# Patient Record
Sex: Male | Born: 1950
Health system: Southern US, Community
[De-identification: ages and names within clinical notes are randomized; demographics above are authoritative.]

## PROBLEM LIST (undated history)

## (undated) DIAGNOSIS — I4891 Unspecified atrial fibrillation: Secondary | ICD-10-CM

## (undated) DIAGNOSIS — I639 Cerebral infarction, unspecified: Secondary | ICD-10-CM

## (undated) DIAGNOSIS — F17200 Nicotine dependence, unspecified, uncomplicated: Secondary | ICD-10-CM

## (undated) DIAGNOSIS — N200 Calculus of kidney: Secondary | ICD-10-CM

## (undated) DIAGNOSIS — F1010 Alcohol abuse, uncomplicated: Secondary | ICD-10-CM

## (undated) DIAGNOSIS — R7401 Elevation of levels of liver transaminase levels: Secondary | ICD-10-CM

## (undated) DIAGNOSIS — K219 Gastro-esophageal reflux disease without esophagitis: Secondary | ICD-10-CM

## (undated) DIAGNOSIS — I10 Essential (primary) hypertension: Secondary | ICD-10-CM

## (undated) DIAGNOSIS — R740 Nonspecific elevation of levels of transaminase and lactic acid dehydrogenase [LDH]: Secondary | ICD-10-CM

## (undated) DIAGNOSIS — K279 Peptic ulcer, site unspecified, unspecified as acute or chronic, without hemorrhage or perforation: Secondary | ICD-10-CM

## (undated) DIAGNOSIS — R7301 Impaired fasting glucose: Secondary | ICD-10-CM

## (undated) DIAGNOSIS — E785 Hyperlipidemia, unspecified: Secondary | ICD-10-CM

## (undated) HISTORY — DX: Gastro-esophageal reflux disease without esophagitis: K21.9

## (undated) HISTORY — DX: Unspecified atrial fibrillation: I48.91

## (undated) HISTORY — DX: Peptic ulcer, site unspecified, unspecified as acute or chronic, without hemorrhage or perforation: K27.9

## (undated) HISTORY — DX: Nicotine dependence, unspecified, uncomplicated: F17.200

## (undated) HISTORY — DX: Calculus of kidney: N20.0

## (undated) HISTORY — DX: Hyperlipidemia, unspecified: E78.5

## (undated) HISTORY — DX: Impaired fasting glucose: R73.01

## (undated) HISTORY — DX: Alcohol abuse, uncomplicated: F10.10

## (undated) HISTORY — DX: Nonspecific elevation of levels of transaminase and lactic acid dehydrogenase (ldh): R74.0

## (undated) HISTORY — DX: Cerebral infarction, unspecified: I63.9

## (undated) HISTORY — DX: Essential (primary) hypertension: I10

## (undated) HISTORY — DX: Elevation of levels of liver transaminase levels: R74.01

---

## 2003-03-13 HISTORY — PX: COLONOSCOPY: SHX174

## 2004-03-12 HISTORY — PX: OTHER SURGICAL HISTORY: SHX169

## 2005-11-10 ENCOUNTER — Emergency Department (HOSPITAL_COMMUNITY): Admission: EM | Admit: 2005-11-10 | Discharge: 2005-11-10 | Payer: Self-pay | Admitting: Emergency Medicine

## 2005-11-10 IMAGING — CR DG ANKLE COMPLETE 3+V*R*
4 series · 4 of 4 positions shown · non-contrast
Comparison: none

CLINICAL DATA: Fell today with pain.
 RIGHT ANKLE:

[t ankle joint ap right]
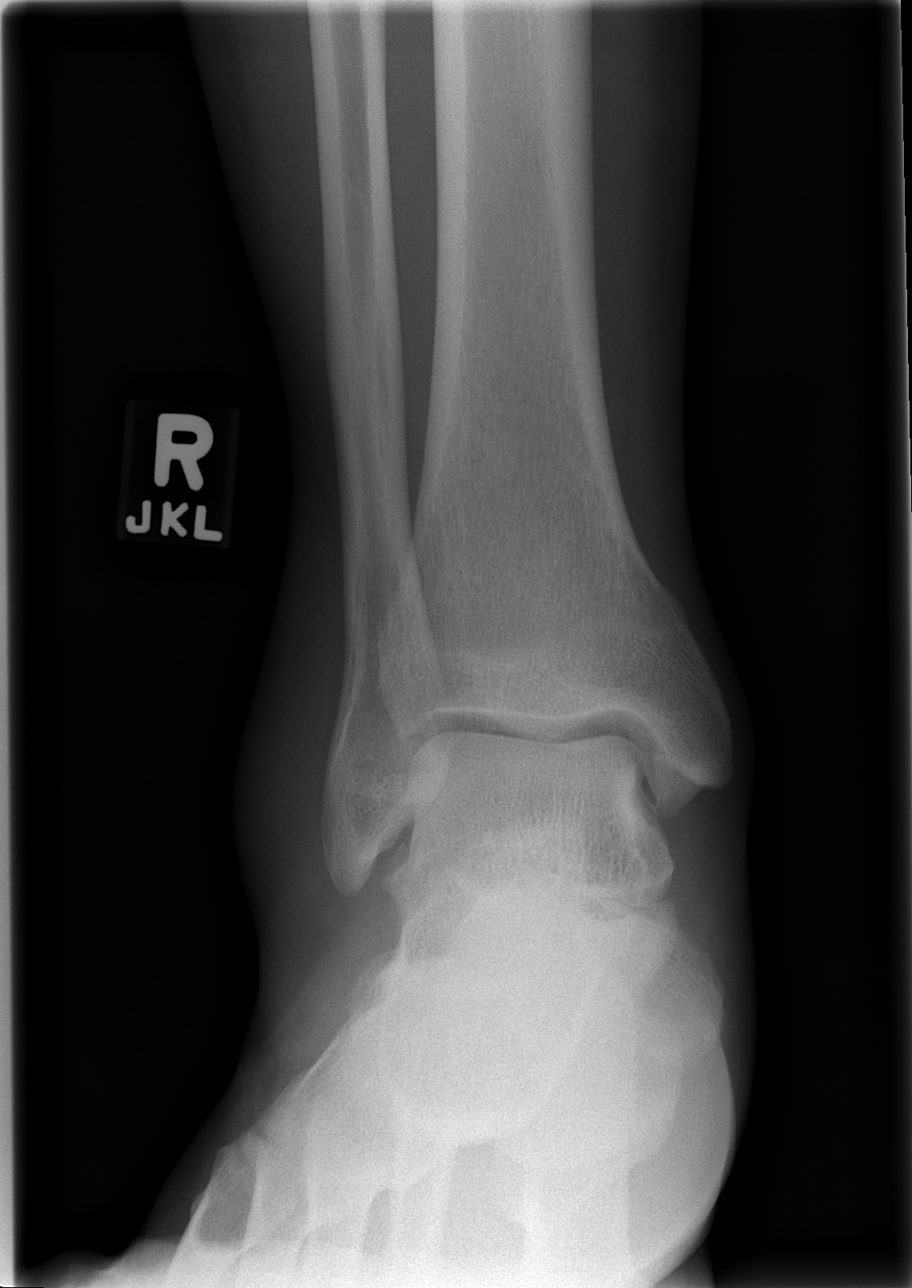

[t ankle joint oblique right]
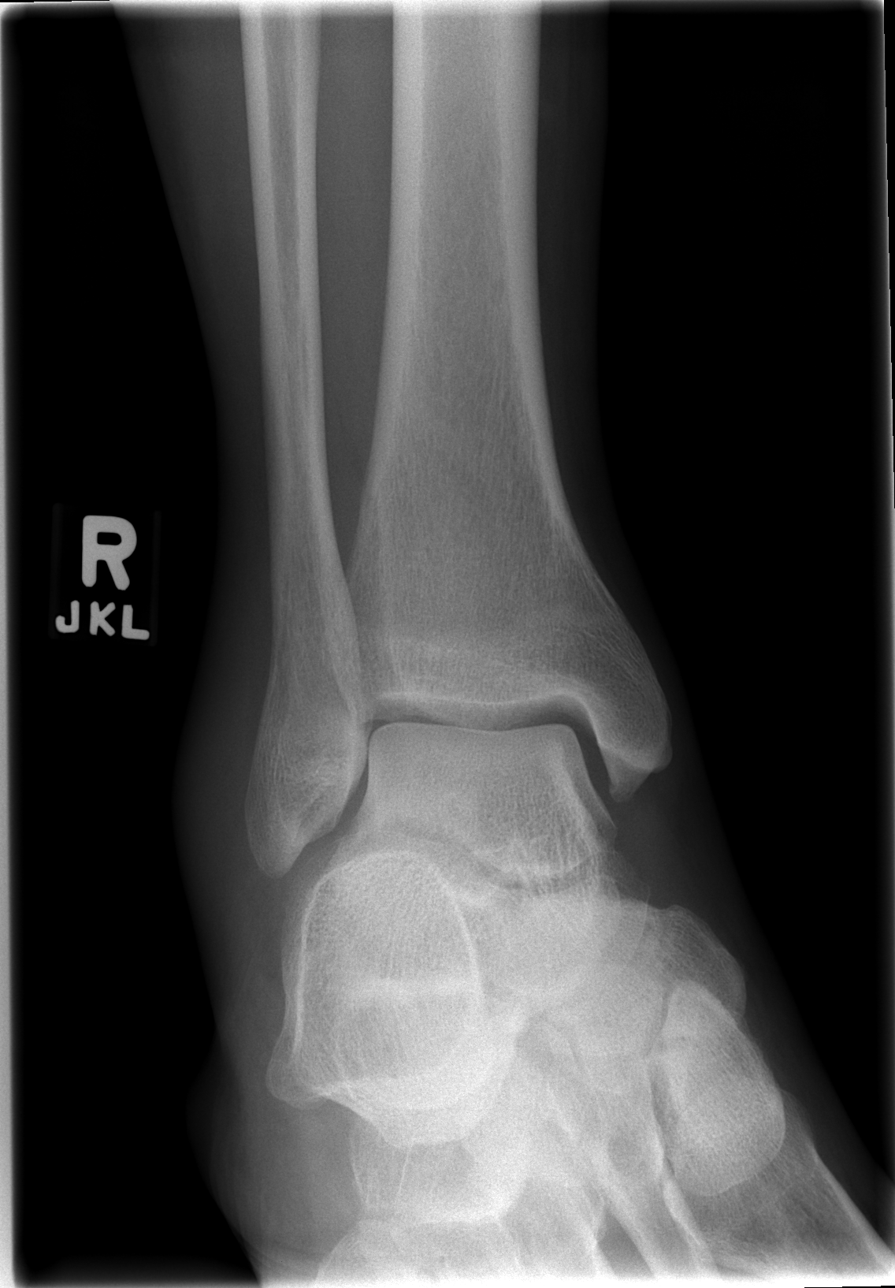

[t ankle joint lat right (1 of 2)]
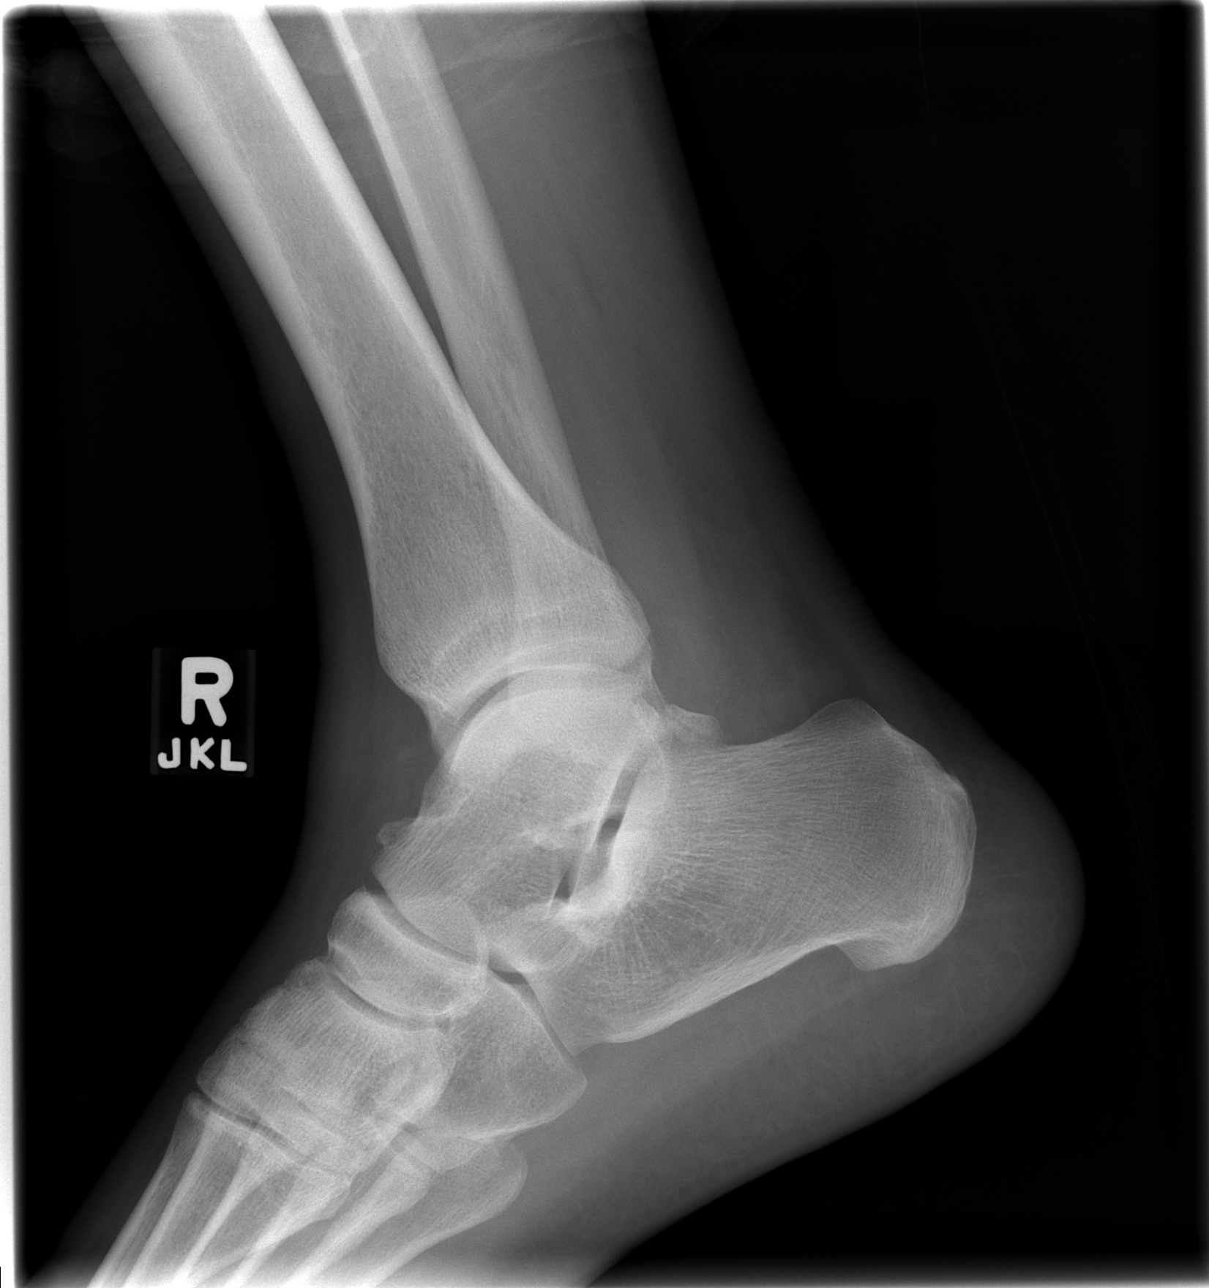

[t ankle joint lat right (2 of 2)]
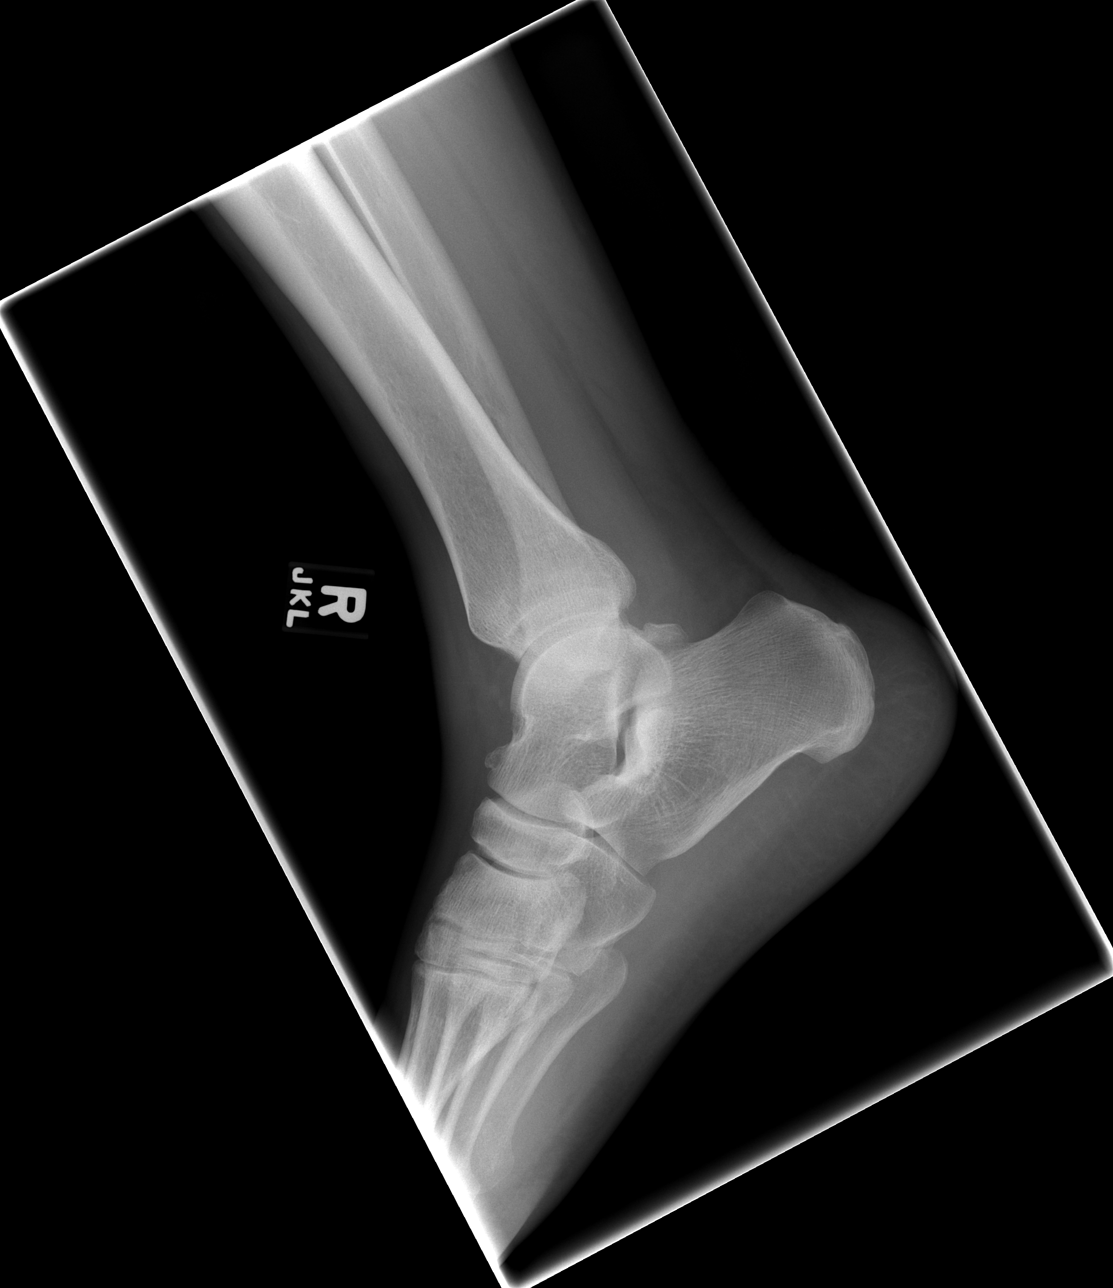

[4 of 4 positions shown; findings below may reference images not displayed]

FINDINGS: There is soft tissue swelling over the lateral malleolus.  The ankle joint appears normal.  No acute fracture is seen.
IMPRESSION: No acute fracture.

## 2013-09-18 DIAGNOSIS — L309 Dermatitis, unspecified: Secondary | ICD-10-CM | POA: Insufficient documentation

## 2013-09-18 DIAGNOSIS — Z720 Tobacco use: Secondary | ICD-10-CM | POA: Insufficient documentation

## 2013-09-18 DIAGNOSIS — I10 Essential (primary) hypertension: Secondary | ICD-10-CM | POA: Insufficient documentation

## 2013-09-18 DIAGNOSIS — K219 Gastro-esophageal reflux disease without esophagitis: Secondary | ICD-10-CM | POA: Insufficient documentation

## 2014-03-12 DIAGNOSIS — R7301 Impaired fasting glucose: Secondary | ICD-10-CM

## 2014-03-12 DIAGNOSIS — E785 Hyperlipidemia, unspecified: Secondary | ICD-10-CM

## 2014-03-12 HISTORY — DX: Hyperlipidemia, unspecified: E78.5

## 2014-03-12 HISTORY — DX: Impaired fasting glucose: R73.01

## 2014-03-15 ENCOUNTER — Ambulatory Visit: Payer: Self-pay | Admitting: Family Medicine

## 2014-03-18 ENCOUNTER — Ambulatory Visit (INDEPENDENT_AMBULATORY_CARE_PROVIDER_SITE_OTHER): Payer: BLUE CROSS/BLUE SHIELD | Admitting: Family Medicine

## 2014-03-18 ENCOUNTER — Encounter: Payer: Self-pay | Admitting: Family Medicine

## 2014-03-18 VITALS — BP 151/110 | HR 98 | Temp 98.0°F | Resp 18 | Ht 70.0 in | Wt 173.0 lb

## 2014-03-18 DIAGNOSIS — Z1211 Encounter for screening for malignant neoplasm of colon: Secondary | ICD-10-CM

## 2014-03-18 DIAGNOSIS — I10 Essential (primary) hypertension: Secondary | ICD-10-CM

## 2014-03-18 DIAGNOSIS — F17200 Nicotine dependence, unspecified, uncomplicated: Secondary | ICD-10-CM

## 2014-03-18 DIAGNOSIS — K219 Gastro-esophageal reflux disease without esophagitis: Secondary | ICD-10-CM

## 2014-03-18 LAB — COMPREHENSIVE METABOLIC PANEL
ALT: 29 U/L (ref 0–53)
AST: 45 U/L — AB (ref 0–37)
Albumin: 3.8 g/dL (ref 3.5–5.2)
Alkaline Phosphatase: 95 U/L (ref 39–117)
BUN: 8 mg/dL (ref 6–23)
CALCIUM: 9.9 mg/dL (ref 8.4–10.5)
CHLORIDE: 105 meq/L (ref 96–112)
CO2: 25 meq/L (ref 19–32)
CREATININE: 1.4 mg/dL (ref 0.4–1.5)
GFR: 68.23 mL/min (ref >60.00)
Glucose, Bld: 109 mg/dL — ABNORMAL HIGH (ref 70–99)
POTASSIUM: 4.5 meq/L (ref 3.5–5.1)
Sodium: 136 mEq/L (ref 135–145)
TOTAL PROTEIN: 7.7 g/dL (ref 6.0–8.3)
Total Bilirubin: 1.4 mg/dL — ABNORMAL HIGH (ref 0.2–1.2)

## 2014-03-18 LAB — CBC WITH DIFFERENTIAL/PLATELET
BASOS ABS: 0 10*3/uL (ref 0.0–0.1)
BASOS PCT: 0.5 % (ref 0.0–3.0)
Eosinophils Absolute: 0.2 10*3/uL (ref 0.0–0.7)
Eosinophils Relative: 4.6 % (ref 0.0–5.0)
HCT: 47.6 % (ref 39.0–52.0)
HEMOGLOBIN: 15.7 g/dL (ref 13.0–17.0)
Lymphocytes Relative: 32.6 % (ref 12.0–46.0)
Lymphs Abs: 1.7 10*3/uL (ref 0.7–4.0)
MCHC: 33.1 g/dL (ref 30.0–36.0)
MCV: 94.8 fl (ref 78.0–100.0)
MONOS PCT: 11.2 % (ref 3.0–12.0)
Monocytes Absolute: 0.6 10*3/uL (ref 0.1–1.0)
NEUTROS ABS: 2.7 10*3/uL (ref 1.4–7.7)
Neutrophils Relative %: 51.1 % (ref 43.0–77.0)
PLATELETS: 232 10*3/uL (ref 150.0–400.0)
RBC: 5.02 Mil/uL (ref 4.22–5.81)
RDW: 15.6 % — ABNORMAL HIGH (ref 11.5–15.5)
WBC: 5.2 10*3/uL (ref 4.0–10.5)

## 2014-03-18 LAB — TSH: TSH: 1.19 u[IU]/mL (ref 0.35–4.50)

## 2014-03-18 LAB — LIPID PANEL
CHOL/HDL RATIO: 3
Cholesterol: 260 mg/dL — ABNORMAL HIGH (ref 0–200)
HDL: 81.8 mg/dL (ref >39.00)
LDL CALC: 161 mg/dL — AB (ref 0–99)
NONHDL: 178.2
Triglycerides: 88 mg/dL (ref 0.0–149.0)
VLDL: 17.6 mg/dL (ref 0.0–40.0)

## 2014-03-18 MED ORDER — METOPROLOL SUCCINATE ER 25 MG PO TB24: 25.0000 mg | ORAL_TABLET | Freq: Every day | ORAL | Status: DC

## 2014-03-18 NOTE — Progress Notes (Signed)
Office Note 03/28/2014  CC:  Chief Complaint  Patient presents with  . Establish Care   HPI:  Russell Russell is a 64 y.o. Black male who is here to establish care. Patient's most recent primary MD:  Dr. Orville Govern ([facility] in [facility]) Old records were not reviewed prior to or during today's visit.  Says last CPE was about 6 yrs ago. Home bp avg <140 syst and <90 diast.  Upon further questioning it sounds like 140s over 90s is more like it.  No side effects from meds.  Denies regular use of NSAIDs.  Says GERD well controlled on daily omeprazole 20mg  dose. He is due for repeat colonoscopy (see [place]).   Past Medical History  Diagnosis Date  . Essential hypertension   . GERD (gastroesophageal reflux disease)   . Nephrolithiasis   . PUD (peptic ulcer disease)   . Tobacco dependence     chantix: "psych effects"  . Hyperlipidemia 03/2014    Atorv started.  . Impaired fasting glucose 03/2014    Past Surgical History  Procedure Laterality Date  . Removal of kidney stones  2006    cystoscopic--removed from ureter.  No prob since.  . Colonoscopy  2005    Recall 10 yrs ([place])    Family History  Problem Relation Age of Onset  . Cancer Mother     ?colon  . Cancer Father   . Cancer Sister     breast  . Cancer Sister     breast    History   Social History  . Marital Status: Married    Spouse Name: N/A    Number of Children: N/A  . Years of Education: N/A   Occupational History  . Not on file.   Social History Main Topics  . Smoking status: Current Every Day Smoker -- 0.50 packs/day for 20 years    Types: Cigarettes  . Smokeless tobacco: Never Used  . Alcohol Use: 4.8 oz/week    8 Cans of beer per week     Comment: Weekends only  . Drug Use: No  . Sexual Activity: Not on file   Other Topics Concern  . Not on file   Social History Narrative   Married, no children.   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   Tob: 10 pack-yr hx (current as of  03/2014).   Alcohol: 8 beers and pint of whisky on weekends only.   Denies hx of prob with drugs or alcohol.    Outpatient Encounter Prescriptions as of 03/18/2014  Medication Sig  . lisinopril (PRINIVIL,ZESTRIL) 20 MG tablet   . metoprolol succinate (TOPROL-XL) 25 MG 24 hr tablet Take 1 tablet (25 mg total) by mouth daily.  Marland Kitchen omeprazole (PRILOSEC) 20 MG capsule Take 20 mg by mouth.  . [DISCONTINUED] buPROPion (WELLBUTRIN XL) 150 MG 24 hr tablet Take 150 mg by mouth.  . [DISCONTINUED] halobetasol (ULTRAVATE) 0.05 % cream Apply topically.  . [DISCONTINUED] omeprazole (PRILOSEC) 20 MG capsule     Allergies  Allergen Reactions  . Penicillins Rash   ROS Review of Systems  Constitutional: Negative for fever and fatigue.  HENT: Negative for congestion and sore throat.   Eyes: Negative for visual disturbance.  Respiratory: Negative for cough.   Cardiovascular: Negative for chest pain.  Gastrointestinal: Negative for nausea and abdominal pain.  Genitourinary: Negative for dysuria.  Musculoskeletal: Negative for back pain and joint swelling.  Skin: Negative for rash.  Neurological: Negative for weakness and headaches.  Hematological: Negative  for adenopathy.    PE; Blood pressure 151/110, pulse 98, temperature 98 F (36.7 C), temperature source Temporal, resp. rate 18, height 5\' 10"  (1.778 m), weight 173 lb (78.472 kg), SpO2 95 %. 152/108 was repeat bp. Gen: Alert, well appearing.  Patient is oriented to person, place, time, and situation. CV: RRR, no m/r/g.   LUNGS: CTA bilat, nonlabored resps, good aeration in all lung fields. EXT: no clubbing, cyanosis, or edema.   Pertinent labs:  none  ASSESSMENT AND PLAN:   New pt; obtain old PCP records from the last 1 yr.  1) HTN: not well controlled.  Add toprol XL 25mg  qd. BMET ordered.  2) GERD; The current medical regimen is effective;  continue present plan and medications.  3) Tobacco dependence: encouraged cessation but he is  not contemplating quitting at this time.  4) Preventative health care:  Colon cancer screening: pt to contact [facility] to arrange repeat colonoscopy as he is due for his 10 yr repeat.   Pt declined flu vaccine today.  Fasting HP labs ordered today.  An After Visit Summary was printed and given to the patient.  Return in about 3 months (around 06/17/2014) for annual CPE (fasting).

## 2014-03-18 NOTE — Progress Notes (Signed)
Pre visit review using our clinic review tool, if applicable. No additional management support is needed unless otherwise documented below in the visit note. 

## 2014-03-19 ENCOUNTER — Telehealth: Payer: Self-pay | Admitting: Family Medicine

## 2014-03-19 NOTE — Telephone Encounter (Signed)
emmi mailed  °

## 2014-03-23 ENCOUNTER — Encounter: Payer: Self-pay | Admitting: Family Medicine

## 2014-03-23 ENCOUNTER — Other Ambulatory Visit: Payer: Self-pay | Admitting: Family Medicine

## 2014-03-23 MED ORDER — ATORVASTATIN CALCIUM 40 MG PO TABS: 40.0000 mg | ORAL_TABLET | Freq: Every day | ORAL | Status: DC

## 2014-06-23 ENCOUNTER — Encounter: Payer: Self-pay | Admitting: Family Medicine

## 2014-06-23 ENCOUNTER — Ambulatory Visit (INDEPENDENT_AMBULATORY_CARE_PROVIDER_SITE_OTHER): Payer: BLUE CROSS/BLUE SHIELD | Admitting: Family Medicine

## 2014-06-23 VITALS — BP 134/110 | HR 95 | Temp 98.5°F | Ht 70.0 in | Wt 168.0 lb

## 2014-06-23 DIAGNOSIS — Z125 Encounter for screening for malignant neoplasm of prostate: Secondary | ICD-10-CM

## 2014-06-23 DIAGNOSIS — I10 Essential (primary) hypertension: Secondary | ICD-10-CM | POA: Diagnosis not present

## 2014-06-23 DIAGNOSIS — R740 Nonspecific elevation of levels of transaminase and lactic acid dehydrogenase [LDH]: Secondary | ICD-10-CM | POA: Diagnosis not present

## 2014-06-23 DIAGNOSIS — Z1211 Encounter for screening for malignant neoplasm of colon: Secondary | ICD-10-CM

## 2014-06-23 DIAGNOSIS — F1010 Alcohol abuse, uncomplicated: Secondary | ICD-10-CM | POA: Diagnosis not present

## 2014-06-23 DIAGNOSIS — E785 Hyperlipidemia, unspecified: Secondary | ICD-10-CM

## 2014-06-23 DIAGNOSIS — Z0000 Encounter for general adult medical examination without abnormal findings: Secondary | ICD-10-CM

## 2014-06-23 DIAGNOSIS — R7401 Elevation of levels of liver transaminase levels: Secondary | ICD-10-CM

## 2014-06-23 LAB — COMPREHENSIVE METABOLIC PANEL
ALBUMIN: 4 g/dL (ref 3.5–5.2)
ALK PHOS: 110 U/L (ref 39–117)
ALT: 28 U/L (ref 0–53)
AST: 33 U/L (ref 0–37)
BILIRUBIN TOTAL: 1.5 mg/dL — AB (ref 0.2–1.2)
BUN: 8 mg/dL (ref 6–23)
CO2: 27 mEq/L (ref 19–32)
Calcium: 10 mg/dL (ref 8.4–10.5)
Chloride: 106 mEq/L (ref 96–112)
Creatinine, Ser: 1.13 mg/dL (ref 0.40–1.50)
GFR: 84.43 mL/min (ref >60.00)
Glucose, Bld: 108 mg/dL — ABNORMAL HIGH (ref 70–99)
Potassium: 4.9 mEq/L (ref 3.5–5.1)
SODIUM: 137 meq/L (ref 135–145)
Total Protein: 7.7 g/dL (ref 6.0–8.3)

## 2014-06-23 LAB — LIPID PANEL
CHOLESTEROL: 230 mg/dL — AB (ref 0–200)
HDL: 71.4 mg/dL (ref >39.00)
LDL Cholesterol: 141 mg/dL — ABNORMAL HIGH (ref 0–99)
NONHDL: 158.6
Total CHOL/HDL Ratio: 3
Triglycerides: 87 mg/dL (ref 0.0–149.0)
VLDL: 17.4 mg/dL (ref 0.0–40.0)

## 2014-06-23 MED ORDER — METOPROLOL SUCCINATE ER 50 MG PO TB24: ORAL_TABLET | ORAL | Status: DC

## 2014-06-23 NOTE — Progress Notes (Signed)
Pre visit review using our clinic review tool, if applicable. No additional management support is needed unless otherwise documented below in the visit note. 

## 2014-06-23 NOTE — Addendum Note (Signed)
Addended by: Julieta Bellini on: 06/23/2014 11:52 AM   Modules accepted: Medications, SmartSet

## 2014-06-23 NOTE — Progress Notes (Signed)
Office Note 06/23/2014  CC:  Chief Complaint  Patient presents with  . Annual Exam   HPI:  Russell Russell is a 64 y.o. Black male who is here for CPE. BP control at home unclear.  "Its on the high side": avg <160 syst and <100 diast. Started atorv 40mg  qd last visit (03/2014) for hyperlipidemia.  No side effects, has been compliant.   Fasting today. His wife gives me more history today: she is concerned about his alcohol abuse--he did not disclose this info to me last visit. She is appropriately worried about his liver (see PMH below).   Past Medical History  Diagnosis Date  . Essential hypertension   . GERD (gastroesophageal reflux disease)   . Nephrolithiasis   . PUD (peptic ulcer disease)   . Tobacco dependence     chantix: "psych effects"  . Hyperlipidemia 03/2014    Atorv started.  . Impaired fasting glucose 03/2014  . Alcohol abuse     drinks 1 gallon of Gin every weekend, nothing during the week x >15 yrs    Past Surgical History  Procedure Laterality Date  . Removal of kidney stones  2006    cystoscopic--removed from ureter.  No prob since.  . Colonoscopy  2005    Recall 10 yrs ([place])    Family History  Problem Relation Age of Onset  . Cancer Mother     ?colon  . Cancer Father   . Cancer Sister     breast  . Cancer Sister     breast    History   Social History  . Marital Status: Married    Spouse Name: N/A  . Number of Children: N/A  . Years of Education: N/A   Occupational History  . Not on file.   Social History Main Topics  . Smoking status: Current Every Day Smoker -- 0.50 packs/day for 20 years    Types: Cigarettes  . Smokeless tobacco: Never Used  . Alcohol Use: 4.8 oz/week    8 Cans of beer per week     Comment: Weekends only  . Drug Use: No  . Sexual Activity: Not on file   Other Topics Concern  . Not on file   Social History Narrative   Married, no children.   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   Tob:  10 pack-yr hx (current as of 03/2014).   Alcohol: 8 beers and pint of whisky on weekends only.   Denies hx of prob with drugs or alcohol.    Outpatient Prescriptions Prior to Visit  Medication Sig Dispense Refill  . atorvastatin (LIPITOR) 40 MG tablet Take 1 tablet (40 mg total) by mouth daily. 30 tablet 6  . lisinopril (PRINIVIL,ZESTRIL) 20 MG tablet   1  . omeprazole (PRILOSEC) 20 MG capsule Take 20 mg by mouth.    . metoprolol succinate (TOPROL-XL) 25 MG 24 hr tablet Take 1 tablet (25 mg total) by mouth daily. 30 tablet 3   No facility-administered medications prior to visit.    Allergies  Allergen Reactions  . Penicillins Rash   ROS Review of Systems  PE; Blood pressure 134/110, pulse 95, temperature 98.5 F (36.9 C), temperature source Oral, height 5\' 10"  (1.778 m), weight 168 lb (76.204 kg), SpO2 97 %. Gen: Alert, well appearing.  Patient is oriented to person, place, time, and situation. AFFECT: pleasant, lucid thought and speech. ENT: Ears: EACs clear, normal epithelium.  TMs with good light reflex and landmarks bilaterally.  Eyes: no injection, icteris, swelling, or exudate.  EOMI, PERRLA. Nose: no drainage or turbinate edema/swelling.  No injection or focal lesion.  Mouth: lips without lesion/swelling.  Oral mucosa pink and moist.  Dentition intact and without obvious caries or gingival swelling.  Oropharynx without erythema, exudate, or swelling.  Neck: supple/nontender.  No LAD, mass, or TM.  Carotid pulses 2+ bilaterally, without bruits. CV: RRR, no m/r/g.   LUNGS: CTA bilat, nonlabored resps, good aeration in all lung fields. ABD: soft, NT, ND, BS normal.  No hepatospenomegaly or mass.  No bruits. EXT: no clubbing, cyanosis, or edema.  Musculoskeletal: no joint swelling, erythema, warmth, or tenderness.  ROM of all joints intact. Skin - no sores or suspicious lesions or rashes or color changes Rectal: pt deferred this today  Pertinent labs:  Lab Results  Component  Value Date   TSH 1.19 03/18/2014   Lab Results  Component Value Date   WBC 5.2 03/18/2014   HGB 15.7 03/18/2014   HCT 47.6 03/18/2014   MCV 94.8 03/18/2014   PLT 232.0 03/18/2014   Lab Results  Component Value Date   CREATININE 1.4 03/18/2014   BUN 8 03/18/2014   NA 136 03/18/2014   K 4.5 03/18/2014   CL 105 03/18/2014   CO2 25 03/18/2014   Lab Results  Component Value Date   ALT 29 03/18/2014   AST 45* 03/18/2014   ALKPHOS 95 03/18/2014   BILITOT 1.4* 03/18/2014   Lab Results  Component Value Date   CHOL 260* 03/18/2014   Lab Results  Component Value Date   HDL 81.80 03/18/2014   Lab Results  Component Value Date   LDLCALC 161* 03/18/2014   Lab Results  Component Value Date   TRIG 88.0 03/18/2014   Lab Results  Component Value Date   CHOLHDL 3 03/18/2014   ASSESSMENT AND PLAN:   1) Alcohol abuse, with mild elev of AST and bili last lab check. Encouraged pt to cut back slowly until he has completely quit. Recheck CMET today, also arrange US abdomen.  2) Hyperlipidemia: tolerating statin.  CMET today. FLP repeat today.  3) HTN; not ideal control.  Increase Toprol XL generic to 50mg  qd today.  4) Reviewed age and gender appropriate health maintenance issues (prudent diet, regular exercise, health risks of tobacco and excessive alcohol, use of seatbelts, fire alarms in home, use of sunscreen).  Also reviewed age and gender appropriate health screening as well as vaccine recommendations. Pt deferred prostate ca screening today, says we may be able to do this at a subsequent f/u visit. Colon ca screening: pt unable to locate/recall exact clinic where he had his last colonoscopy, so he asked for referral to [facility] today for next colonoscopy, which he is now due for. He declined HIV screening today. Says last Td was 2008.  An After Visit Summary was printed and given to the patient.  FOLLOW UP:  Return in about 4 weeks (around 07/21/2014) for f/u  ETOH/HTN.

## 2014-06-25 ENCOUNTER — Ambulatory Visit (HOSPITAL_BASED_OUTPATIENT_CLINIC_OR_DEPARTMENT_OTHER): Payer: BLUE CROSS/BLUE SHIELD

## 2014-06-27 ENCOUNTER — Ambulatory Visit (HOSPITAL_BASED_OUTPATIENT_CLINIC_OR_DEPARTMENT_OTHER)
Admission: RE | Admit: 2014-06-27 | Discharge: 2014-06-27 | Disposition: A | Discharge status: Home / Self Care | Payer: BLUE CROSS/BLUE SHIELD | Source: Ambulatory Visit | Attending: Family Medicine | Admitting: Family Medicine

## 2014-06-27 DIAGNOSIS — R740 Nonspecific elevation of levels of transaminase and lactic acid dehydrogenase [LDH]: Secondary | ICD-10-CM

## 2014-06-27 DIAGNOSIS — F1010 Alcohol abuse, uncomplicated: Secondary | ICD-10-CM

## 2014-06-27 DIAGNOSIS — R7989 Other specified abnormal findings of blood chemistry: Secondary | ICD-10-CM | POA: Diagnosis not present

## 2014-06-27 DIAGNOSIS — D1809 Hemangioma of other sites: Secondary | ICD-10-CM | POA: Insufficient documentation

## 2014-06-27 DIAGNOSIS — R7401 Elevation of levels of liver transaminase levels: Secondary | ICD-10-CM

## 2014-06-27 IMAGING — US US ABDOMEN COMPLETE
1 series · 14 of 25 positions shown · non-contrast
Comparison: Abdomen and pelvis CT dated [DATE].

CLINICAL DATA: Elevated liver function tests and bilirubin. Alcohol
abuse.

EXAM:
ULTRASOUND ABDOMEN COMPLETE

[Series 1: us abdomen complete · 0.18mm/px · 14 of 109 slices shown]
[im 1/109]
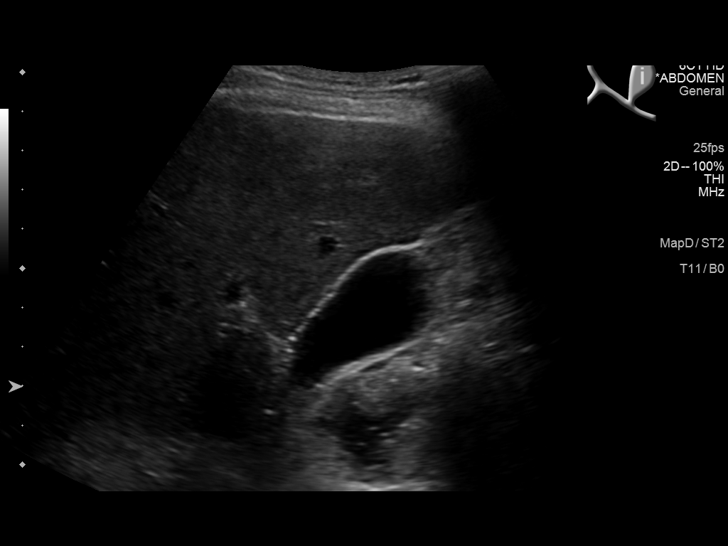
[im 10/109]
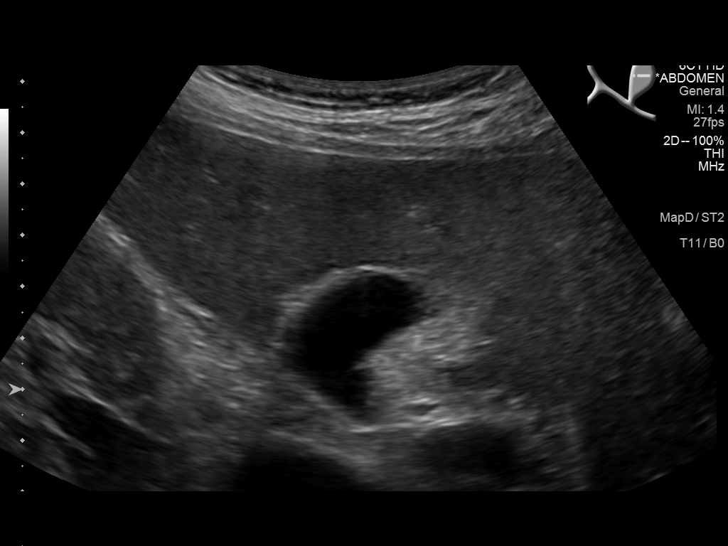
[im 19/109]
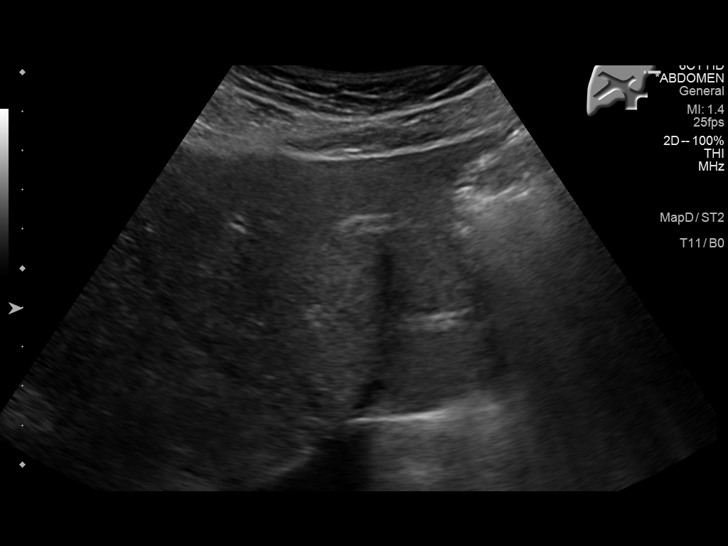
[im 28/109]
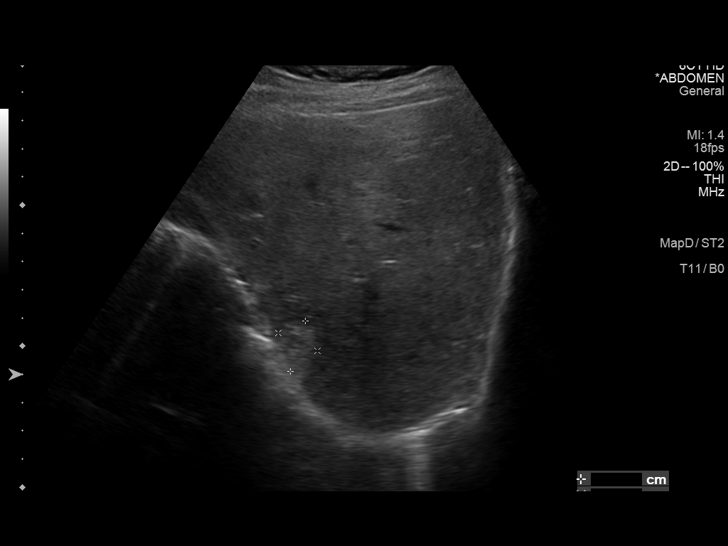
[im 37/109]
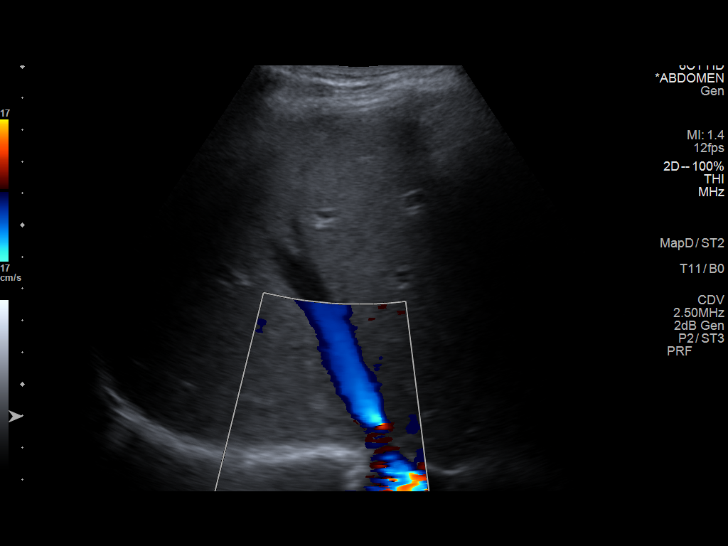
[im 41/109]
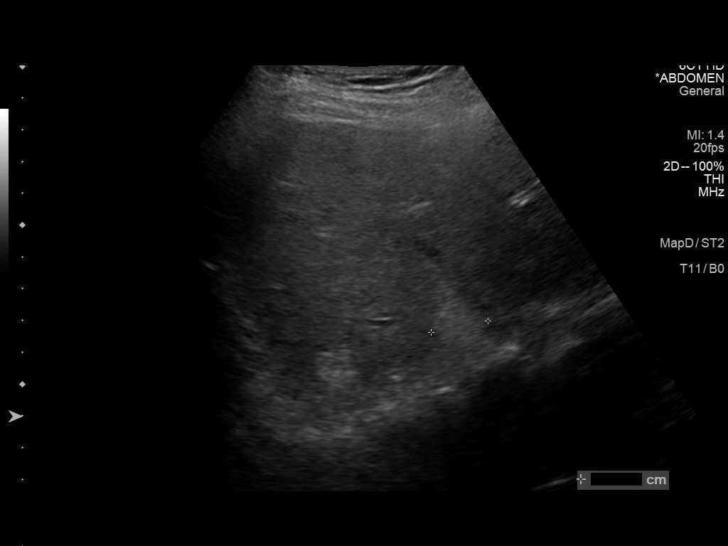
[im 50/109]
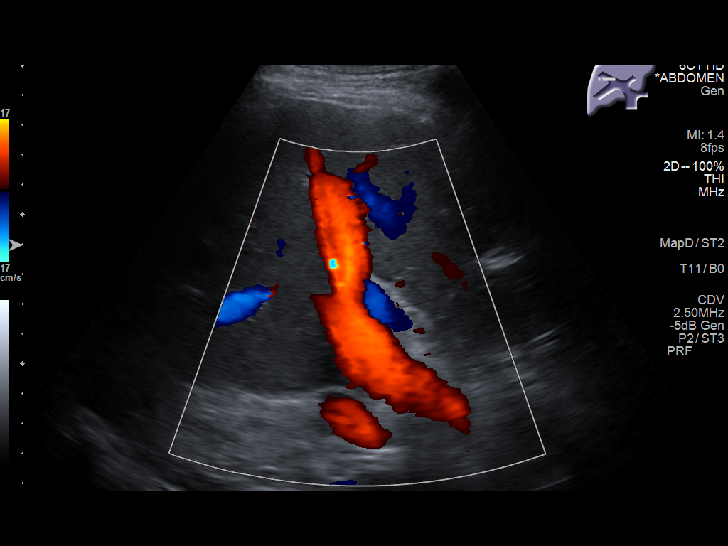
[im 59/109]
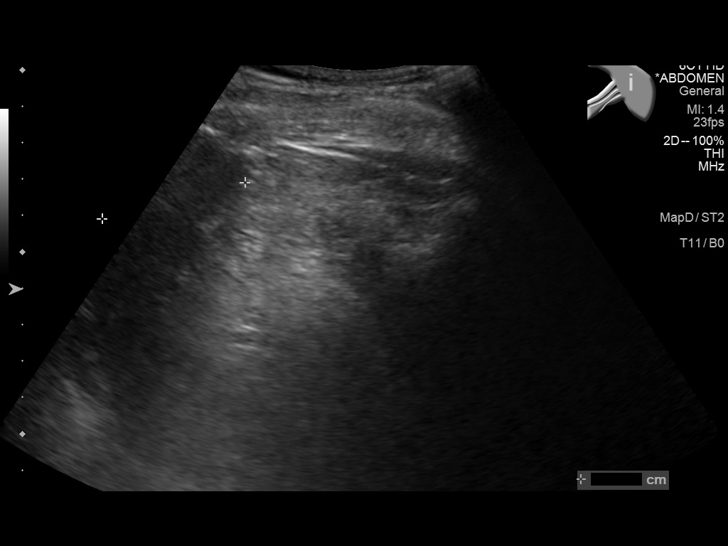
[im 68/109]
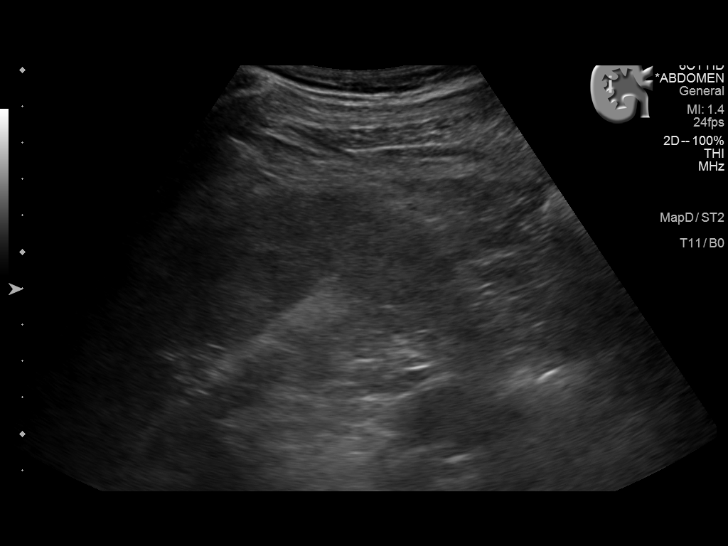
[im 73/109]
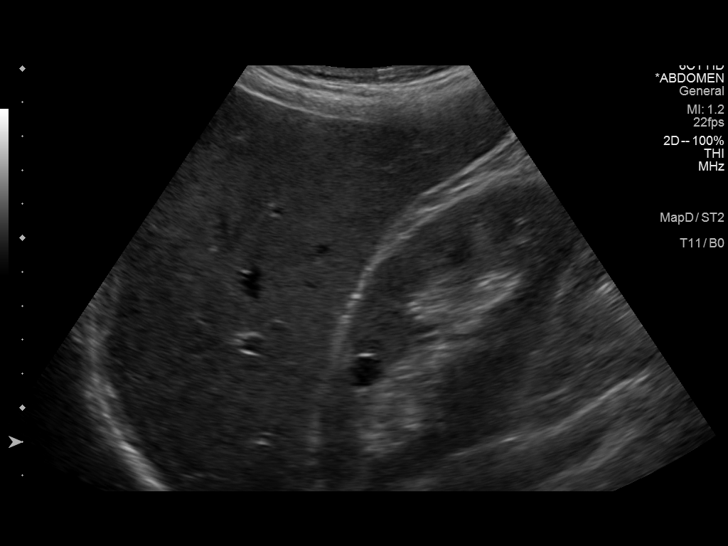
[im 82/109]
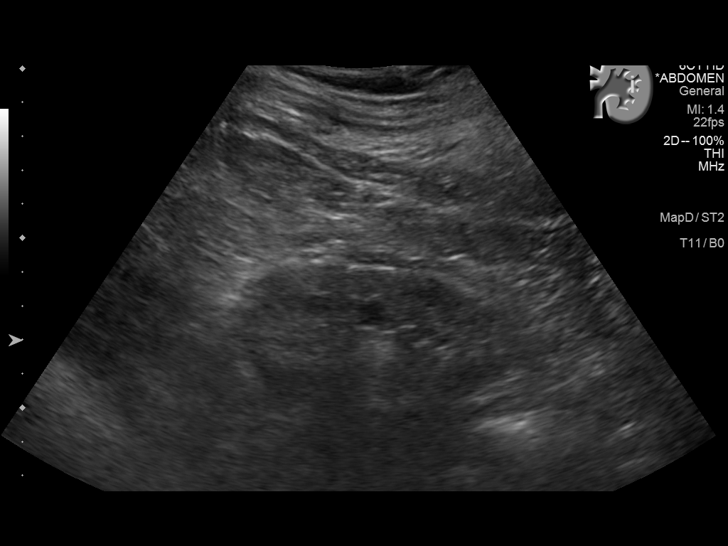
[im 91/109]
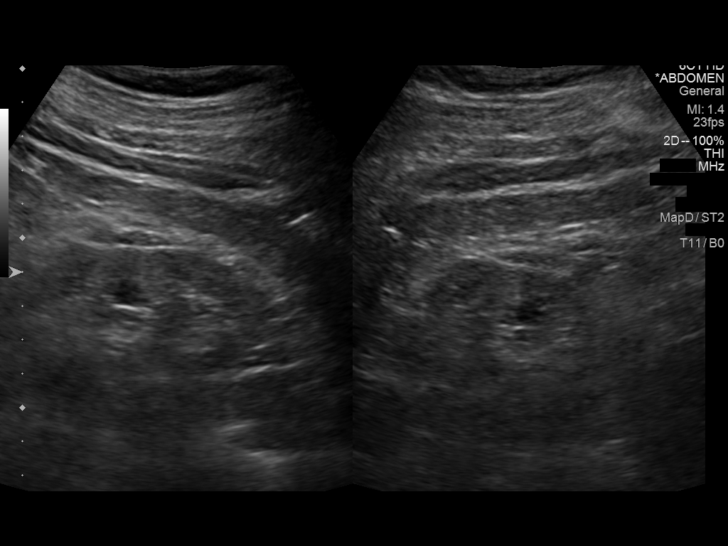
[im 100/109]
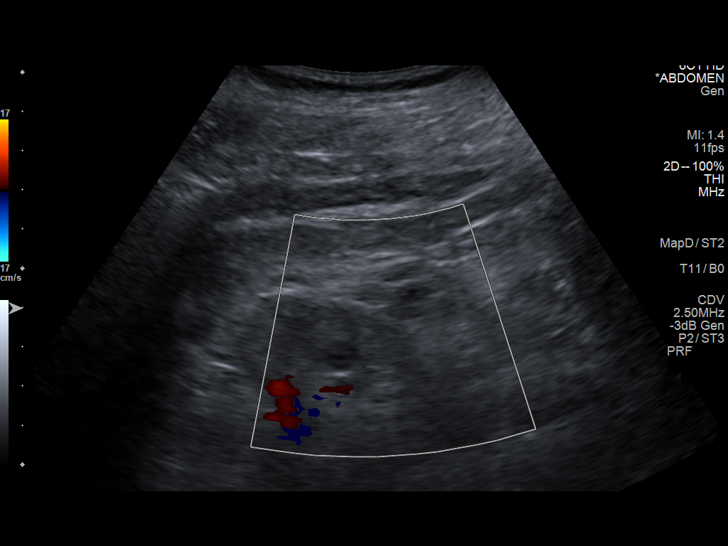
[im 109/109]
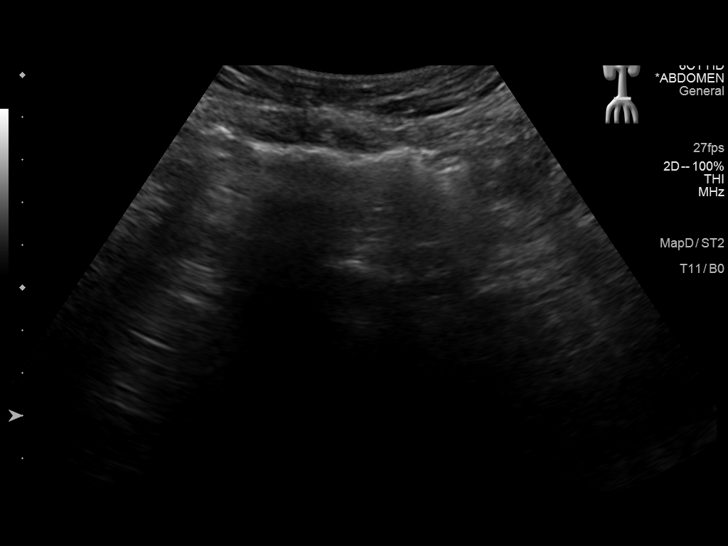

[14 of 25 positions shown; findings below may reference images not displayed]

FINDINGS: Gallbladder: No gallstones or wall thickening visualized. No
sonographic Murphy sign noted.

Common bile duct: Diameter: 2.0 mm

Liver: Multiple small, oval, echogenic masses in the liver measuring
up to 1.9 cm in maximum diameter each. These have not changed
significantly in size compared to the previous CT.

IVC: No abnormality visualized.

Pancreas: Visualized portion unremarkable.

Spleen: Poorly visualized.  Grossly normal.

Right Kidney: Length: 11.6 cm. Normal echogenicity. 2 small cysts.
No hydronephrosis.

Left Kidney: Length: 9.6 cm. Small cysts. No hydronephrosis. Normal
echogenicity.

Abdominal aorta: No aneurysm visualized.

Other findings: None.
IMPRESSION: 1. Stable small liver hemangiomas.
2. No acute abnormality.

## 2014-06-28 ENCOUNTER — Telehealth: Payer: Self-pay | Admitting: Family Medicine

## 2014-06-28 NOTE — Telephone Encounter (Signed)
Pt's wife aware of results per lab result note.

## 2014-06-28 NOTE — Telephone Encounter (Signed)
Patient is inquiring about US results.

## 2014-07-21 ENCOUNTER — Ambulatory Visit: Payer: BLUE CROSS/BLUE SHIELD | Admitting: Family Medicine

## 2014-07-22 ENCOUNTER — Other Ambulatory Visit: Payer: Self-pay

## 2014-07-22 MED ORDER — METOPROLOL SUCCINATE ER 50 MG PO TB24: ORAL_TABLET | ORAL | Status: DC

## 2014-07-22 NOTE — Telephone Encounter (Signed)
Patient requesting 90 day supply. Last OV 06/23/14. 90 Day supply sent in with no refills.

## 2014-08-06 ENCOUNTER — Encounter: Payer: Self-pay | Admitting: Family Medicine

## 2014-08-06 ENCOUNTER — Ambulatory Visit: Payer: BLUE CROSS/BLUE SHIELD | Admitting: Family Medicine

## 2014-09-13 ENCOUNTER — Emergency Department (HOSPITAL_BASED_OUTPATIENT_CLINIC_OR_DEPARTMENT_OTHER): Payer: BLUE CROSS/BLUE SHIELD

## 2014-09-13 ENCOUNTER — Emergency Department (HOSPITAL_BASED_OUTPATIENT_CLINIC_OR_DEPARTMENT_OTHER)
Admission: EM | Admit: 2014-09-13 | Discharge: 2014-09-13 | Disposition: A | Discharge status: Home / Self Care | Payer: BLUE CROSS/BLUE SHIELD | Attending: Emergency Medicine | Admitting: Emergency Medicine

## 2014-09-13 ENCOUNTER — Encounter (HOSPITAL_BASED_OUTPATIENT_CLINIC_OR_DEPARTMENT_OTHER): Payer: Self-pay | Admitting: *Deleted

## 2014-09-13 DIAGNOSIS — R109 Unspecified abdominal pain: Secondary | ICD-10-CM | POA: Diagnosis not present

## 2014-09-13 DIAGNOSIS — Z9889 Other specified postprocedural states: Secondary | ICD-10-CM | POA: Insufficient documentation

## 2014-09-13 DIAGNOSIS — Z79899 Other long term (current) drug therapy: Secondary | ICD-10-CM | POA: Insufficient documentation

## 2014-09-13 DIAGNOSIS — Z87442 Personal history of urinary calculi: Secondary | ICD-10-CM | POA: Insufficient documentation

## 2014-09-13 DIAGNOSIS — E785 Hyperlipidemia, unspecified: Secondary | ICD-10-CM | POA: Diagnosis not present

## 2014-09-13 DIAGNOSIS — Z8711 Personal history of peptic ulcer disease: Secondary | ICD-10-CM | POA: Insufficient documentation

## 2014-09-13 DIAGNOSIS — K219 Gastro-esophageal reflux disease without esophagitis: Secondary | ICD-10-CM | POA: Insufficient documentation

## 2014-09-13 DIAGNOSIS — Z880 Allergy status to penicillin: Secondary | ICD-10-CM | POA: Diagnosis not present

## 2014-09-13 DIAGNOSIS — I10 Essential (primary) hypertension: Secondary | ICD-10-CM | POA: Diagnosis not present

## 2014-09-13 LAB — URINALYSIS, ROUTINE W REFLEX MICROSCOPIC
BILIRUBIN URINE: NEGATIVE
Glucose, UA: 250 mg/dL — AB
Hgb urine dipstick: NEGATIVE
KETONES UR: NEGATIVE mg/dL
Leukocytes, UA: NEGATIVE
Nitrite: NEGATIVE
Protein, ur: NEGATIVE mg/dL
Specific Gravity, Urine: 1.006 (ref 1.005–1.030)
Urobilinogen, UA: 0.2 mg/dL (ref 0.0–1.0)
pH: 5.5 (ref 5.0–8.0)

## 2014-09-13 IMAGING — CT CT RENAL STONE PROTOCOL
2 of 4 series · 16 of 46 positions shown, 18 images · non-contrast
Comparison: [DATE]

CLINICAL DATA: RIGHT flank pain for 1 month, history essential
hypertension, nephrolithiasis, smoking, hyperlipidemia

EXAM:
CT ABDOMEN AND PELVIS WITHOUT CONTRAST
TECHNIQUE: Multidetector CT imaging of the abdomen and pelvis was performed
following the standard protocol without IV contrast. Sagittal and
coronal MPR images reconstructed from axial data set.

[Series 2: renal stone > 200 lbs 5.0 b31f · axial · 0.79mm/px · z∈[-555,-75]mm · 13 of 109 slices shown, 15 images]
[im 9/109  soft-tissue]
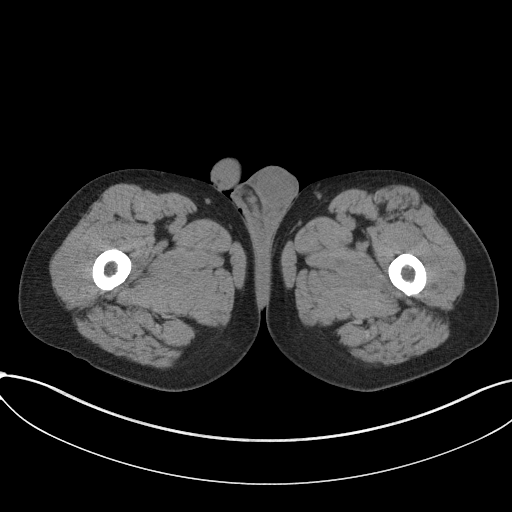
[im 9/109  bone]
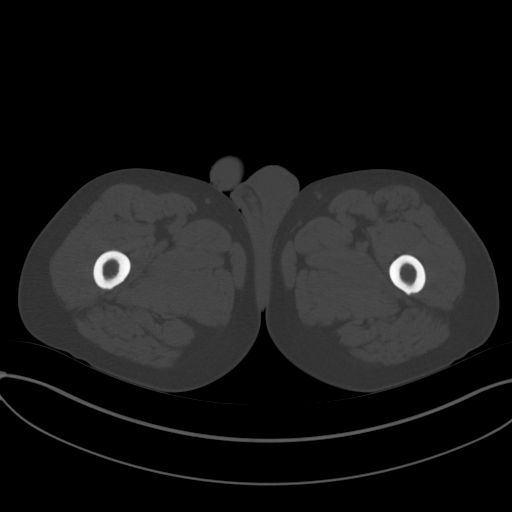
[im 17/109  soft-tissue]
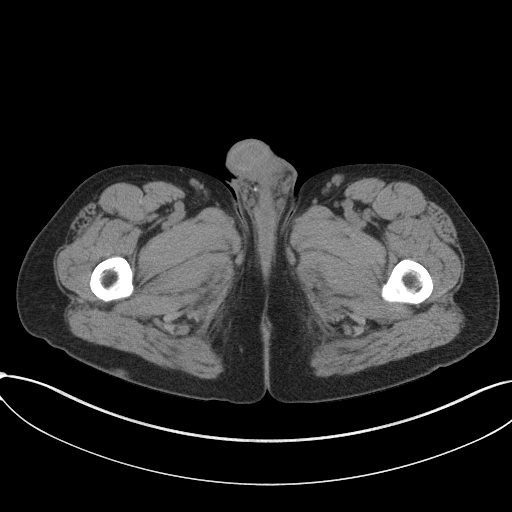
[im 25/109  soft-tissue]
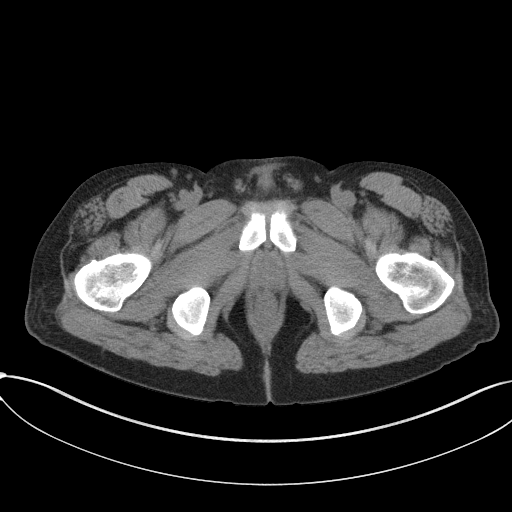
[im 33/109  soft-tissue]
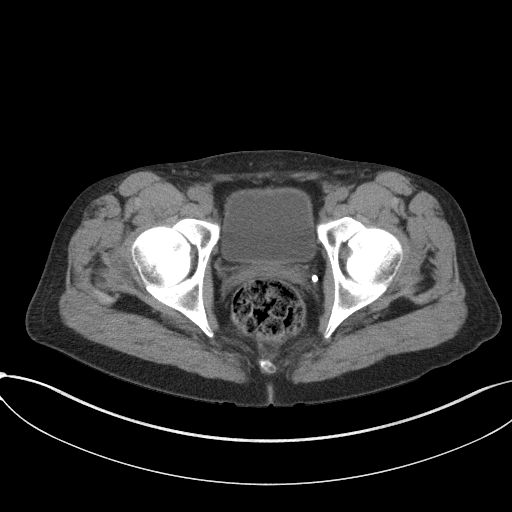
[im 41/109  soft-tissue]
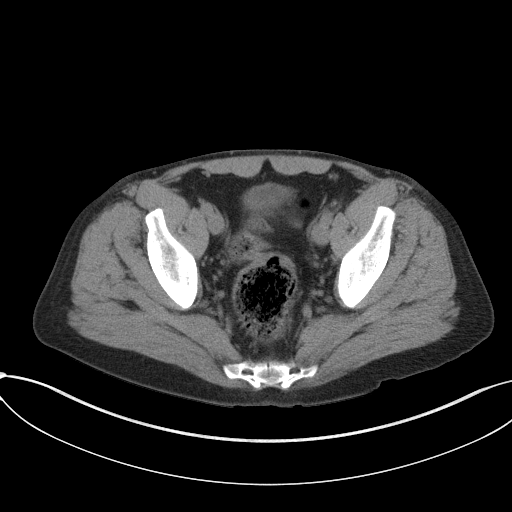
[im 49/109  soft-tissue]
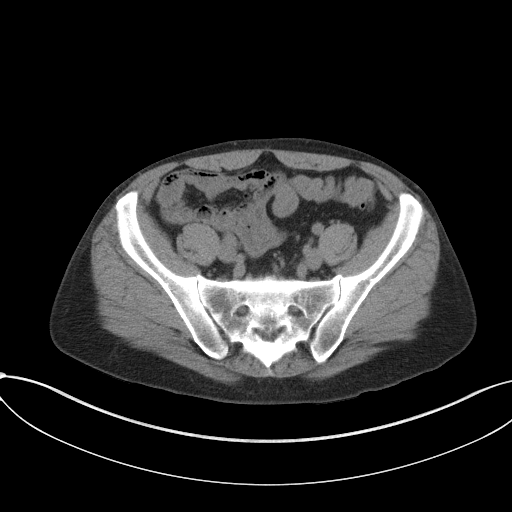
[im 57/109  soft-tissue]
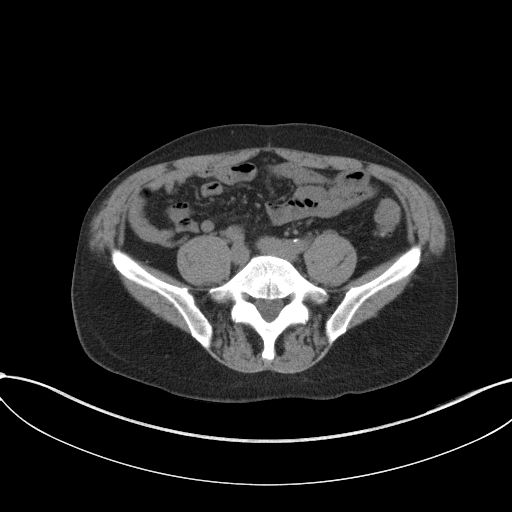
[im 65/109  soft-tissue]
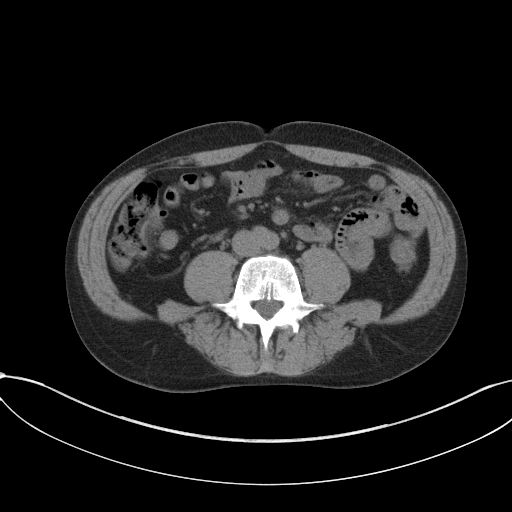
[im 73/109  soft-tissue]
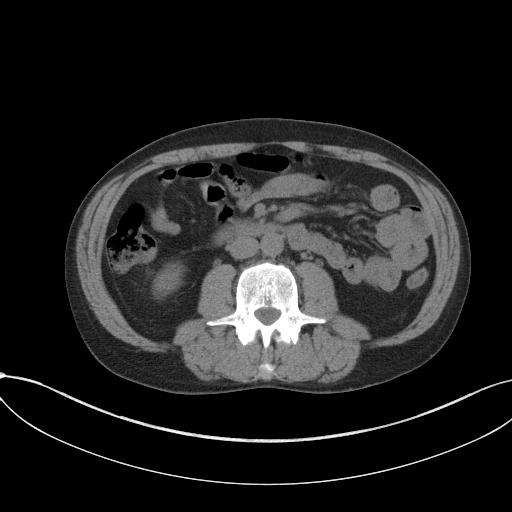
[im 73/109  bone]
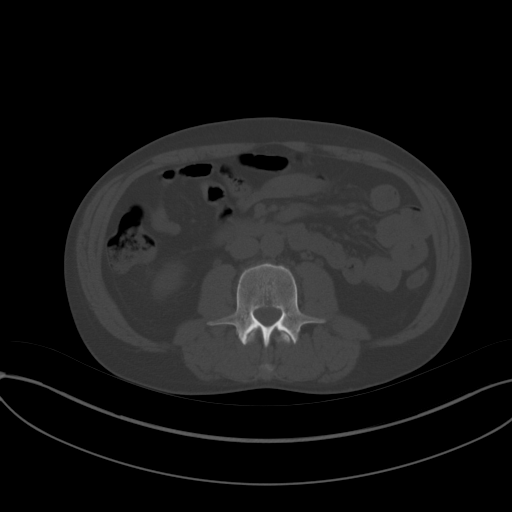
[im 81/109  soft-tissue]
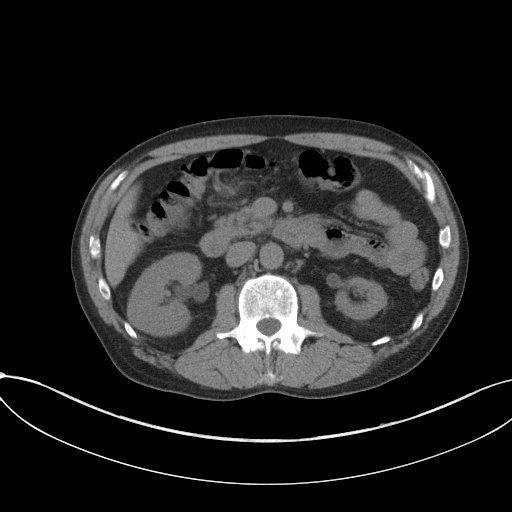
[im 89/109  soft-tissue]
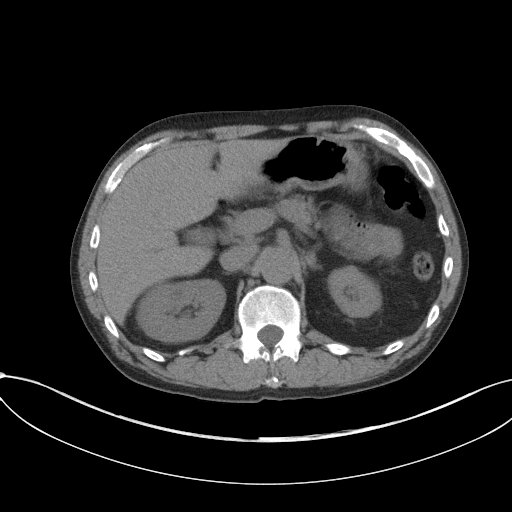
[im 97/109  soft-tissue]
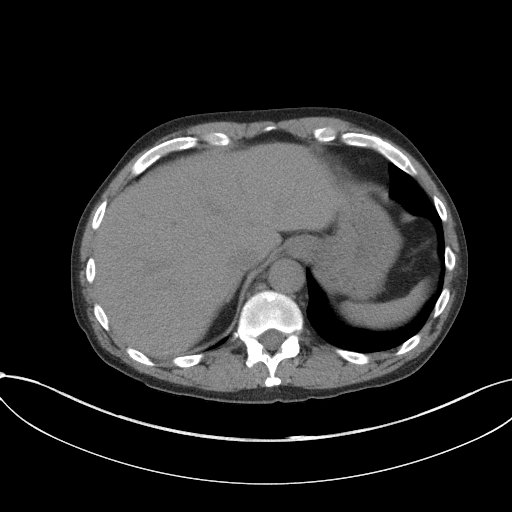
[im 105/109  soft-tissue]
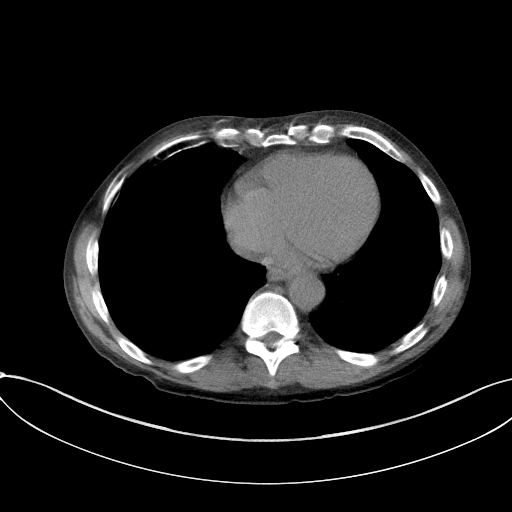

[Series 5: renal stone 3.0 coronal · coronal · 0.76mm/px · 3 of 78 slices shown]
[im 26/78  soft-tissue]
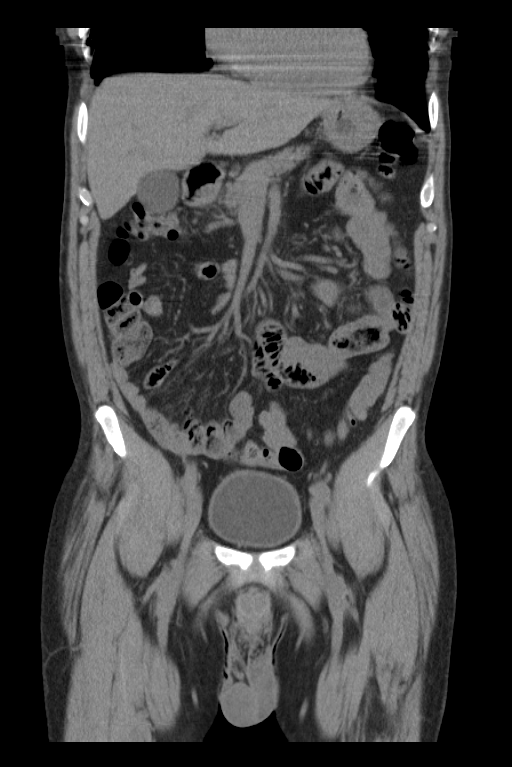
[im 35/78  soft-tissue]
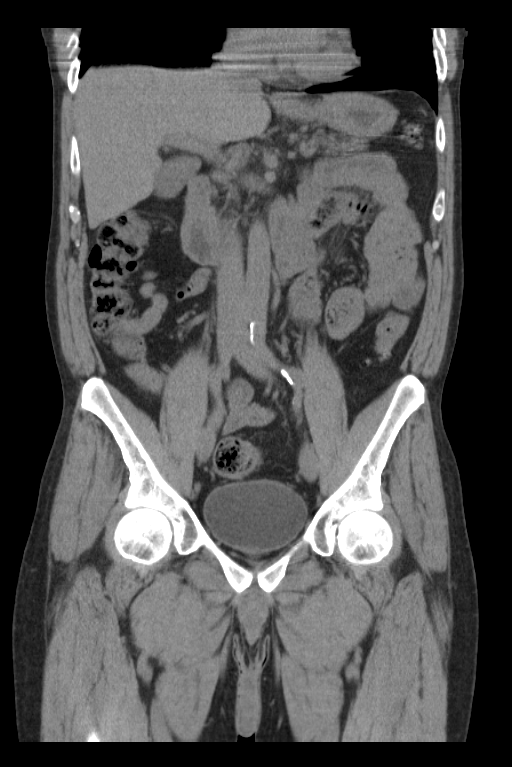
[im 43/78  soft-tissue]
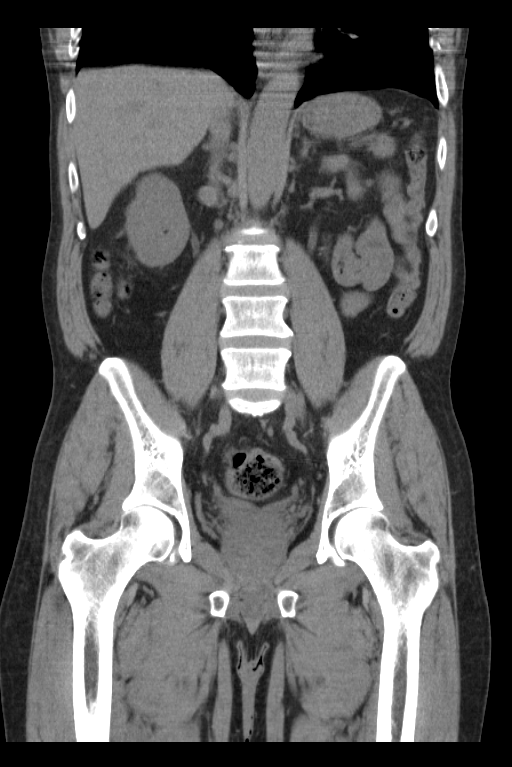

[16 of 46 positions shown; findings below may reference images not displayed]

FINDINGS: Respiratory motion artifacts at lung bases, grossly clear.

Slightly small appearing LEFT kidney with small renal cysts.

Minimal dilatation of proximal LEFT ureter without ureteral
calcification

No urinary tract calcification or dilatation.

Unremarkable bladder.

Low-attenuation focus upper RIGHT kidney 11 mm diameter question
tiny cyst.

Remainder of liver, spleen, pancreas, kidneys and adrenal glands
normal.

Normal appendix.

Stomach and bowel loops normal appearance.

No mass, adenopathy, free air, free fluid, or inflammatory process.

Tiny umbilical hernia containing fat.

No additional hernias or acute osseous findings.
IMPRESSION: Probable small BILATERAL renal cysts.

Tiny umbilical hernia containing fat.

No definite acute intra-abdominal or intrapelvic abnormalities.

## 2014-09-13 MED ORDER — TRAMADOL HCL 50 MG PO TABS: 50.0000 mg | ORAL_TABLET | Freq: Four times a day (QID) | ORAL | Status: DC | PRN

## 2014-09-13 NOTE — ED Provider Notes (Signed)
CSN: [id number]     Arrival date & time 09/13/14  1124 History   First MD Initiated Contact with Patient 09/13/14 1133     Chief Complaint  Patient presents with  . Flank Pain     (Consider location/radiation/quality/duration/timing/severity/associated sxs/prior Treatment) HPI Comments: Patient presents to the ER for evaluation of pain in the right flank that has been ongoing for 2 weeks. Patient reports a history of kidney stones, thinks he might have another kidney stone. He has not noticed any fever, nausea, vomiting, chills. He denies hematuria, dysuria, frequency. Pain is constant and dull, does worsen with movement.  Patient is a 64 y.o. male presenting with flank pain.  Flank Pain    Past Medical History  Diagnosis Date  . Essential hypertension   . GERD (gastroesophageal reflux disease)   . Nephrolithiasis   . PUD (peptic ulcer disease)   . Tobacco dependence     chantix: "psych effects"  . Hyperlipidemia 03/2014    Atorv started-chol improved  . Impaired fasting glucose 03/2014  . Alcohol abuse     drinks 1 gallon of Gin every weekend, nothing during the week x >15 yrs  . Elevated transaminase level     +elev bili: abd u/s 06/2014 showed stable small hepatic hemangiomas, o/w normal.   Past Surgical History  Procedure Laterality Date  . Removal of kidney stones  2006    cystoscopic--removed from ureter.  No prob since.  . Colonoscopy  2005    Recall 10 yrs ([place])   Family History  Problem Relation Age of Onset  . Cancer Mother     ?colon  . Cancer Father   . Cancer Sister     breast  . Cancer Sister     breast   History  Substance Use Topics  . Smoking status: Current Every Day Smoker -- 0.50 packs/day for 20 years    Types: Cigarettes  . Smokeless tobacco: Never Used  . Alcohol Use: 4.8 oz/week    8 Cans of beer per week     Comment: Weekends only    Review of Systems  Genitourinary: Positive for flank pain.  All other systems reviewed and are  negative.     Allergies  Penicillins  Home Medications   Prior to Admission medications   Medication Sig Start Date End Date Taking? Authorizing Provider  atorvastatin (LIPITOR) 40 MG tablet Take 1 tablet (40 mg total) by mouth daily. 03/23/14   Tammi Sou, MD  lisinopril (PRINIVIL,ZESTRIL) 20 MG tablet  12/23/13   Historical Provider, MD  metoprolol succinate (TOPROL-XL) 50 MG 24 hr tablet 1 tab po qd 07/22/14   Tammi Sou, MD  omeprazole (PRILOSEC) 20 MG capsule Take 20 mg by mouth. 09/18/13   Historical Provider, MD   BP 136/86 mmHg  Pulse 100  Temp(Src) 97.8 F (36.6 C) (Oral)  Resp 20  Ht 5\' 10"  (1.778 m)  Wt 165 lb (74.844 kg)  BMI 23.68 kg/m2  SpO2 98% Physical Exam  Constitutional: He is oriented to person, place, and time. He appears well-developed and well-nourished. No distress.  HENT:  Head: Normocephalic and atraumatic.  Right Ear: Hearing normal.  Left Ear: Hearing normal.  Nose: Nose normal.  Mouth/Throat: Oropharynx is clear and moist and mucous membranes are normal.  Eyes: Conjunctivae and EOM are normal. Pupils are equal, round, and reactive to light.  Neck: Normal range of motion. Neck supple.  Cardiovascular: Regular rhythm, S1 normal and S2 normal.  Exam  reveals no gallop and no friction rub.   No murmur heard. Pulmonary/Chest: Effort normal and breath sounds normal. No respiratory distress. He exhibits no tenderness.  Abdominal: Soft. Normal appearance and bowel sounds are normal. There is no hepatosplenomegaly. There is no tenderness. There is no rebound, no guarding, no tenderness at McBurney's point and negative Murphy's sign. No hernia.  Musculoskeletal: Normal range of motion.  Neurological: He is alert and oriented to person, place, and time. He has normal strength. No cranial nerve deficit or sensory deficit. Coordination normal. GCS eye subscore is 4. GCS verbal subscore is 5. GCS motor subscore is 6.  Skin: Skin is warm, dry and  intact. No rash noted. No cyanosis.  Psychiatric: He has a normal mood and affect. His speech is normal and behavior is normal. Thought content normal.  Nursing note and vitals reviewed.   ED Course  Procedures (including critical care time) Labs Review Labs Reviewed  URINALYSIS, ROUTINE W REFLEX MICROSCOPIC (NOT AT [facility]) - Abnormal; Notable for the following:    Glucose, UA 250 (*)    All other components within normal limits    Imaging Review Ct Renal Stone Study  09/13/2014   CLINICAL DATA:  RIGHT flank pain for 1 month, history essential hypertension, nephrolithiasis, smoking, hyperlipidemia  EXAM: CT ABDOMEN AND PELVIS WITHOUT CONTRAST  TECHNIQUE: Multidetector CT imaging of the abdomen and pelvis was performed following the standard protocol without IV contrast. Sagittal and coronal MPR images reconstructed from axial data set.  COMPARISON:  03/22/2005  FINDINGS: Respiratory motion artifacts at lung bases, grossly clear.  Slightly small appearing LEFT kidney with small renal cysts.  Minimal dilatation of proximal LEFT ureter without ureteral calcification  No urinary tract calcification or dilatation.  Unremarkable bladder.  Low-attenuation focus upper RIGHT kidney 11 mm diameter question tiny cyst.  Remainder of liver, spleen, pancreas, kidneys and adrenal glands normal.  Normal appendix.  Stomach and bowel loops normal appearance.  No mass, adenopathy, free air, free fluid, or inflammatory process.  Tiny umbilical hernia containing fat.  No additional hernias or acute osseous findings.  IMPRESSION: Probable small BILATERAL renal cysts.  Tiny umbilical hernia containing fat.  No definite acute intra-abdominal or intrapelvic abnormalities.   Electronically Signed   By: Lavonia Dana M.D.   On: 09/13/2014 12:02     EKG Interpretation None      MDM   Final diagnoses:  Right flank pain    She will benign exam. There is no abdominal tenderness to examination. Urinalysis does not suggest  infection. CT stone study does not show any evidence of rebound, including no evidence of ureterolithiasis. Symptoms most likely secondary to musculoskeletal nature. Treat with rest and analgesia.    Orpah Greek, MD 09/13/14 1239

## 2014-09-13 NOTE — Discharge Instructions (Signed)
Flank Pain °Flank pain refers to pain that is located on the side of the body between the upper abdomen and the back. The pain may occur over a short period of time (acute) or may be long-term or reoccurring (chronic). It may be mild or severe. Flank pain can be caused by many things. °CAUSES  °Some of the more common causes of flank pain include: °· Muscle strains.   °· Muscle spasms.   °· A disease of your spine (vertebral disk disease).   °· A lung infection (pneumonia).   °· Fluid around your lungs (pulmonary edema).   °· A kidney infection.   °· Kidney stones.   °· A very painful skin rash caused by the chickenpox virus (shingles).   °· Gallbladder disease.   °HOME CARE INSTRUCTIONS  °Home care will depend on the cause of your pain. In general, °· Rest as directed by your caregiver. °· Drink enough fluids to keep your urine clear or pale yellow. °· Only take over-the-counter or prescription medicines as directed by your caregiver. Some medicines may help relieve the pain. °· Tell your caregiver about any changes in your pain. °· Follow up with your caregiver as directed. °SEEK IMMEDIATE MEDICAL CARE IF:  °· Your pain is not controlled with medicine.   °· You have new or worsening symptoms. °· Your pain increases.   °· You have abdominal pain.   °· You have shortness of breath.   °· You have persistent nausea or vomiting.   °· You have swelling in your abdomen.   °· You feel faint or pass out.   °· You have blood in your urine. °· You have a fever or persistent symptoms for more than 2-3 days. °· You have a fever and your symptoms suddenly get worse. °MAKE SURE YOU:  °· Understand these instructions. °· Will watch your condition. °· Will get help right away if you are not doing well or get worse. °Document Released: 04/19/2005 Document Revised: 11/21/2011 Document Reviewed: 10/11/2011 °Exit[employer]® Patient Information ©2015 [employer]. This information is not intended to replace advice given to you by your  health care provider. Make sure you discuss any questions you have with your health care provider. ° °

## 2014-09-13 NOTE — ED Notes (Signed)
Right flank pain x 2 weeks. States he thinks he thinks he has a kidney stone.

## 2016-09-10 ENCOUNTER — Telehealth: Payer: Self-pay | Admitting: Family Medicine

## 2016-09-10 NOTE — Telephone Encounter (Signed)
Patient would like to transfer care due to location and the [place] location having X-ray. Out of courtesy for both providers I will await approval before completing transfer.

## 2016-09-10 NOTE — Telephone Encounter (Signed)
Okay 

## 2016-09-11 ENCOUNTER — Ambulatory Visit: Payer: BLUE CROSS/BLUE SHIELD | Admitting: Family Medicine

## 2016-09-20 NOTE — Telephone Encounter (Signed)
OK with me.

## 2016-12-17 ENCOUNTER — Ambulatory Visit: Payer: BLUE CROSS/BLUE SHIELD | Admitting: Family Medicine

## 2016-12-17 DIAGNOSIS — Z0289 Encounter for other administrative examinations: Secondary | ICD-10-CM

## 2016-12-20 NOTE — Telephone Encounter (Signed)
Patient was scheduled for 10/12 at 11am however, it needs to be for a 30 min appointment and as a new patient. I left voice mails on all phones including the wife's phone to reschedule the patient due to the error. Awaiting patient's phone call.

## 2016-12-21 ENCOUNTER — Ambulatory Visit: Payer: BLUE CROSS/BLUE SHIELD | Admitting: Family Medicine

## 2016-12-21 ENCOUNTER — Encounter: Payer: Self-pay | Admitting: Family Medicine

## 2016-12-21 ENCOUNTER — Ambulatory Visit (INDEPENDENT_AMBULATORY_CARE_PROVIDER_SITE_OTHER): Payer: BLUE CROSS/BLUE SHIELD | Admitting: Family Medicine

## 2016-12-21 VITALS — BP 130/86 | HR 96 | Temp 97.8°F | Ht 70.0 in | Wt 155.0 lb

## 2016-12-21 DIAGNOSIS — F17210 Nicotine dependence, cigarettes, uncomplicated: Secondary | ICD-10-CM | POA: Diagnosis not present

## 2016-12-21 DIAGNOSIS — E7800 Pure hypercholesterolemia, unspecified: Secondary | ICD-10-CM | POA: Diagnosis not present

## 2016-12-21 DIAGNOSIS — R945 Abnormal results of liver function studies: Secondary | ICD-10-CM | POA: Diagnosis not present

## 2016-12-21 DIAGNOSIS — F1020 Alcohol dependence, uncomplicated: Secondary | ICD-10-CM | POA: Diagnosis not present

## 2016-12-21 DIAGNOSIS — Z1211 Encounter for screening for malignant neoplasm of colon: Secondary | ICD-10-CM

## 2016-12-21 DIAGNOSIS — K219 Gastro-esophageal reflux disease without esophagitis: Secondary | ICD-10-CM | POA: Diagnosis not present

## 2016-12-21 DIAGNOSIS — I10 Essential (primary) hypertension: Secondary | ICD-10-CM

## 2016-12-21 DIAGNOSIS — R7989 Other specified abnormal findings of blood chemistry: Secondary | ICD-10-CM

## 2016-12-21 LAB — LIPID PANEL
Cholesterol: 186 mg/dL (ref 0–200)
HDL: 69.9 mg/dL (ref >39.00)
LDL Cholesterol: 104 mg/dL — ABNORMAL HIGH (ref 0–99)
NonHDL: 116.28
Total CHOL/HDL Ratio: 3
Triglycerides: 62 mg/dL (ref 0.0–149.0)
VLDL: 12.4 mg/dL (ref 0.0–40.0)

## 2016-12-21 LAB — COMPREHENSIVE METABOLIC PANEL
ALT: 24 U/L (ref 0–53)
AST: 45 U/L — ABNORMAL HIGH (ref 0–37)
Albumin: 4 g/dL (ref 3.5–5.2)
Alkaline Phosphatase: 100 U/L (ref 39–117)
BUN: 10 mg/dL (ref 6–23)
CO2: 27 mEq/L (ref 19–32)
Calcium: 9.2 mg/dL (ref 8.4–10.5)
Chloride: 107 mEq/L (ref 96–112)
Creatinine, Ser: 1.08 mg/dL (ref 0.40–1.50)
GFR: 88.25 mL/min (ref >60.00)
Glucose, Bld: 97 mg/dL (ref 70–99)
Potassium: 4.9 mEq/L (ref 3.5–5.1)
Sodium: 141 mEq/L (ref 135–145)
Total Bilirubin: 0.9 mg/dL (ref 0.2–1.2)
Total Protein: 7.6 g/dL (ref 6.0–8.3)

## 2016-12-21 LAB — CBC WITH DIFFERENTIAL/PLATELET
Basophils Absolute: 0 10*3/uL (ref 0.0–0.1)
Basophils Relative: 0.7 % (ref 0.0–3.0)
Eosinophils Absolute: 0.1 10*3/uL (ref 0.0–0.7)
Eosinophils Relative: 1.5 % (ref 0.0–5.0)
HCT: 45.6 % (ref 39.0–52.0)
Hemoglobin: 15.1 g/dL (ref 13.0–17.0)
Lymphocytes Relative: 26.3 % (ref 12.0–46.0)
Lymphs Abs: 1.2 10*3/uL (ref 0.7–4.0)
MCHC: 33 g/dL (ref 30.0–36.0)
MCV: 98.9 fl (ref 78.0–100.0)
Monocytes Absolute: 0.5 10*3/uL (ref 0.1–1.0)
Monocytes Relative: 10.5 % (ref 3.0–12.0)
Neutro Abs: 2.8 10*3/uL (ref 1.4–7.7)
Neutrophils Relative %: 61 % (ref 43.0–77.0)
Platelets: 171 10*3/uL (ref 150.0–400.0)
RBC: 4.61 Mil/uL (ref 4.22–5.81)
RDW: 14.4 % (ref 11.5–15.5)
WBC: 4.7 10*3/uL (ref 4.0–10.5)

## 2016-12-21 LAB — HEMOGLOBIN A1C: Hgb A1c MFr Bld: 6 % (ref 4.6–6.5)

## 2016-12-21 NOTE — Progress Notes (Signed)
Russell Russell is a 66 y.o. male is here to [place].   Patient Care Team: Tammi Sou, MD as PCP - General (Family Medicine)   History of Present Illness:   [name] CMA acting as scribe for Dr. [name].  HPI: Patient comes in today to establish care. He is wanting to discuss his blood pressure. Patient has not been seen in [place] since 2016.   Hypertension: Patient has been on blood pressure medication in the past. He took himself off of the medication about a year ago. He would like to discuss if he needs to go back on medications today. Blood pressure was good today. We will wait on labs to determine if the patient still needs blood pressure medication.   Alcohol abuse: Patient drinks on the weekends. He never drinks during the weekdays. Patient drinks Gin. When patient drinks he looks for the intent to get drunk. He drinks alone. Sometimes he drinks during watching sorts. Patient had a pint last night. He stated that a pint is a taste for him. Would like to see what his BP is after not drinking. Patient stated that he does feel that his drinking is a problem. He doesn't understand why he does not want to drink during the week, but wants to drink on the weekend. The concern is more because he drinks liquor. Dr. [name] ask what we can do to help the patient to stop. Is he is self medicating then she halp by giving a prescription to help or recommend someone to help. Patients wife stated that when he is off work during the day he drinks. He drinks to the excess that he needs help. He drank two gallons of liquor one day last week.  Patient has tried to stop drinking about 8 years ago. He stopped for a couple of months. He is not sure why he started again. He stopped cold [place] and denies having any withdrawal symptoms. Discussed with wife how to find fun things to do together during the weekend to help not thinking about drinking. Would like him to stop cold [place]. If he has  any problems to call so we can help. We will get labs today to check liver function.   Health Maintenance Due  Topic Date Due  . Hepatitis C Screening  06-02-1950  . COLONOSCOPY  03/14/2013  . TETANUS/TDAP  03/12/2016  . INFLUENZA VACCINE  10/10/2016  . PNA vac Low Risk Adult (1 of 2 - PCV13) 12/21/2016   Depression screen PHQ 2/9 12/21/2016  Decreased Interest 0  Down, Depressed, Hopeless 0  PHQ - 2 Score 0   PMHx, SurgHx, SocialHx, Medications, and Allergies were reviewed in the Visit Navigator and updated as appropriate.   Past Medical History:  Diagnosis Date  . Alcohol abuse    drinks 1 gallon of Gin every weekend, nothing during the week x >15 yrs  . Elevated transaminase level    +elev bili: abd u/s 06/2014 showed stable small hepatic hemangiomas, o/w normal.  . Essential hypertension   . GERD (gastroesophageal reflux disease)   . Hyperlipidemia 03/2014   Atorv started-chol improved  . Impaired fasting glucose 03/2014  . Nephrolithiasis   . PUD (peptic ulcer disease)   . Tobacco dependence    chantix: "psych effects"   Past Surgical History:  Procedure Laterality Date  . COLONOSCOPY  2005   Recall 10 yrs ([place])  . removal of kidney stones  2006   cystoscopic--removed from ureter.  No prob since.   Family History  Problem Relation Age of Onset  . Colon cancer Mother   . Cancer Father   . Breast cancer Sister   . Breast cancer Sister    Social History  Substance Use Topics  . Smoking status: Current Every Day Smoker    Packs/day: 0.50    Years: 20.00    Types: Cigarettes  . Smokeless tobacco: Never Used  . Alcohol use 4.8 oz/week    8 Cans of beer per week     Comment: Weekends only   Current Medications and Allergies:   .  halobetasol (ULTRAVATE) 0.05 % cream, Apply topically., Disp: , Rfl:  .  omeprazole (PRILOSEC) 20 MG capsule, Take 20 mg by mouth., Disp: , Rfl:    Allergies  Allergen Reactions  . Penicillins Rash   Review of Systems:    Pertinent items are noted in the HPI. Otherwise, ROS is negative.  Vitals:   Vitals:   12/21/16 1020  BP: 130/86  Pulse: 96  Temp: 97.8 F (36.6 C)  TempSrc: Oral  SpO2: 99%  Weight: 155 lb (70.3 kg)  Height: 5\' 10"  (1.778 m)     Body mass index is 22.24 kg/m.   Physical Exam:   Physical Exam  Constitutional: He is oriented to person, place, and time. He appears well-developed and well-nourished. No distress.  HENT:  Head: Normocephalic and atraumatic.  Right Ear: External ear normal.  Left Ear: External ear normal.  Nose: Nose normal.  Mouth/Throat: Oropharynx is clear and moist.  Eyes: Pupils are equal, round, and reactive to light. Conjunctivae and EOM are normal.  Neck: Normal range of motion. Neck supple.  Cardiovascular: Normal rate, regular rhythm, normal heart sounds and intact distal pulses.   Pulmonary/Chest: Effort normal and breath sounds normal.  Abdominal: Soft. Bowel sounds are normal.  Musculoskeletal: Normal range of motion.  Neurological: He is alert and oriented to person, place, and time.  Skin: Skin is warm and dry.  Irregular nevus on occiput.   Psychiatric: He has a normal mood and affect. His behavior is normal. Judgment and thought content normal.  Nursing note and vitals reviewed.  Assessment and Plan:   Diagnoses and all orders for this visit:  Alcoholism [facility]) Comments: Long discussion re: cessation. He is ready and willing to quit. We reviewed strategies. Orders: -     CBC with Differential/Platelet -     Comprehensive metabolic panel -     Lipid panel -     Hemoglobin A1c -     Hepatitis C Antibody  Screening for colon cancer -     Ambulatory referral to Gastroenterology  Cigarette nicotine dependence without complication Comments: Precontemplative stage. Orders: -     CBC with Differential/Platelet -     Comprehensive metabolic panel -     Lipid panel -     Hemoglobin A1c -     Hepatitis C Antibody  Essential  hypertension Comments: At goal today. Will monitor. Orders: -     CBC with Differential/Platelet -     Comprehensive metabolic panel -     Lipid panel -     Hemoglobin A1c -     Hepatitis C Antibody  Pure hypercholesterolemia Comments: Recheck FLP today.  Gastroesophageal reflux disease without esophagitis Comments: Continue current treatment.    . Reviewed expectations re: course of current medical issues. . Discussed self-management of symptoms. . Outlined signs and symptoms indicating need for more acute intervention. . Patient  verbalized understanding and all questions were answered. Marland Kitchen Health Maintenance issues including appropriate healthy diet, exercise, and smoking avoidance were discussed with patient. . See orders for this visit as documented in the electronic medical record. . Patient received an After Visit Summary.  CMA served as Education administrator during this visit. History, Physical, and Plan performed by medical provider. The above documentation has been reviewed and is accurate and complete. Briscoe Deutscher, D.O.  Briscoe Deutscher, DO [place], [place] 12/21/2016  Future Appointments Date Time Provider [place]  03/22/2017 8:15 AM Briscoe Deutscher, DO [facility] None

## 2016-12-22 LAB — HEPATITIS C ANTIBODY
Hepatitis C Ab: NONREACTIVE
SIGNAL TO CUT-OFF: 0.02 (ref <1.00)

## 2016-12-23 ENCOUNTER — Encounter: Payer: Self-pay | Admitting: Family Medicine

## 2016-12-23 DIAGNOSIS — R7989 Other specified abnormal findings of blood chemistry: Secondary | ICD-10-CM | POA: Insufficient documentation

## 2016-12-23 DIAGNOSIS — R945 Abnormal results of liver function studies: Secondary | ICD-10-CM

## 2016-12-23 DIAGNOSIS — R7303 Prediabetes: Secondary | ICD-10-CM | POA: Insufficient documentation

## 2016-12-23 NOTE — Addendum Note (Signed)
Addended by: Briscoe Deutscher R on: 12/23/2016 06:14 PM   Modules accepted: Orders

## 2016-12-26 ENCOUNTER — Encounter: Payer: Self-pay | Admitting: Gastroenterology

## 2017-01-01 NOTE — Progress Notes (Signed)
Pt established with Dr. [name] on 12/21/16. Pls change PCP name on EMR to Dr. Dayton Bailiff

## 2017-01-07 ENCOUNTER — Other Ambulatory Visit: Payer: BLUE CROSS/BLUE SHIELD

## 2017-01-25 ENCOUNTER — Encounter: Payer: Self-pay | Admitting: Gastroenterology

## 2017-02-11 ENCOUNTER — Encounter: Payer: BLUE CROSS/BLUE SHIELD | Admitting: Gastroenterology

## 2017-02-15 ENCOUNTER — Encounter: Payer: BLUE CROSS/BLUE SHIELD | Admitting: Gastroenterology

## 2017-03-22 ENCOUNTER — Ambulatory Visit: Payer: BLUE CROSS/BLUE SHIELD | Admitting: Family Medicine

## 2017-03-29 ENCOUNTER — Encounter: Payer: BLUE CROSS/BLUE SHIELD | Admitting: Gastroenterology

## 2017-04-15 ENCOUNTER — Encounter: Payer: Self-pay | Admitting: Family Medicine

## 2017-09-07 ENCOUNTER — Encounter (HOSPITAL_BASED_OUTPATIENT_CLINIC_OR_DEPARTMENT_OTHER): Payer: Self-pay | Admitting: Emergency Medicine

## 2017-09-07 ENCOUNTER — Other Ambulatory Visit: Payer: Self-pay

## 2017-09-07 ENCOUNTER — Emergency Department (HOSPITAL_BASED_OUTPATIENT_CLINIC_OR_DEPARTMENT_OTHER)
Admission: EM | Admit: 2017-09-07 | Discharge: 2017-09-07 | Discharge status: Against Medical Advice | Payer: BLUE CROSS/BLUE SHIELD | Attending: Emergency Medicine | Admitting: Emergency Medicine

## 2017-09-07 DIAGNOSIS — Y999 Unspecified external cause status: Secondary | ICD-10-CM | POA: Diagnosis not present

## 2017-09-07 DIAGNOSIS — Y92018 Other place in single-family (private) house as the place of occurrence of the external cause: Secondary | ICD-10-CM | POA: Insufficient documentation

## 2017-09-07 DIAGNOSIS — Y9389 Activity, other specified: Secondary | ICD-10-CM | POA: Insufficient documentation

## 2017-09-07 DIAGNOSIS — F17210 Nicotine dependence, cigarettes, uncomplicated: Secondary | ICD-10-CM | POA: Diagnosis not present

## 2017-09-07 DIAGNOSIS — S01511A Laceration without foreign body of lip, initial encounter: Secondary | ICD-10-CM

## 2017-09-07 DIAGNOSIS — W01198A Fall on same level from slipping, tripping and stumbling with subsequent striking against other object, initial encounter: Secondary | ICD-10-CM | POA: Diagnosis not present

## 2017-09-07 DIAGNOSIS — I10 Essential (primary) hypertension: Secondary | ICD-10-CM | POA: Diagnosis not present

## 2017-09-07 DIAGNOSIS — W19XXXA Unspecified fall, initial encounter: Secondary | ICD-10-CM

## 2017-09-07 MED ORDER — CHLORHEXIDINE GLUCONATE 0.12 % MT SOLN: 15.0000 mL | Freq: Two times a day (BID) | OROMUCOSAL | 0 refills | Status: DC

## 2017-09-07 MED ORDER — CLINDAMYCIN HCL 150 MG PO CAPS: 450.0000 mg | ORAL_CAPSULE | Freq: Four times a day (QID) | ORAL | 0 refills | Status: AC

## 2017-09-07 NOTE — ED Triage Notes (Signed)
Pt reports getting drunk and falling last night. Laceration noted to upper lip. Denies other injury.

## 2017-09-07 NOTE — Discharge Instructions (Signed)
Take antibiotics as directed. Please take all of your antibiotics until finished.  Use the antibiotic mouthwash as directed.   As we discussed, keep the area clean.    Do not let any food gets stuck in the wound.  Return emergency department any fevers, redness or swelling of the lip, drainage from the lip, vision changes, difficulty walking, vomiting or any other worsening or concerning symptoms.

## 2017-09-07 NOTE — ED Notes (Signed)
Rx x 2 given to pt and family at time of discharge. Pt ambulatory with steady gait. Denies pain. Pt's wife present to drive

## 2017-09-07 NOTE — ED Provider Notes (Signed)
[facility] EMERGENCY DEPARTMENT Provider Note   CSN: [id number] Arrival date & time: 09/07/17  1221     History   Chief Complaint Chief Complaint  Patient presents with  . Fall    HPI Russell Russell is a 67 y.o. male past medical history of alcohol abuse, GERD, hyperlipidemia who presents for evaluation of lip laceration associated with mechanical fall that occurred last night.  Patient reports that he had been drinking last night prior to onset of fall.  He states that he was walking in his house and tripped over something, causing him to fall backwards and bite his lip.  He states he did not have any LOC.  This fall was unwitnessed.  Patient reports that since then he has had a small wound to the anterior aspect of his upper lip.  Patient states that he wanted to get it evaluated.  Patient states that he has been drinking today.  Does not quantify how much he has been drinking.  Wife states he is acting his normal baseline but does state that he is slightly unsteady on his feet, which she says is baseline when he has been drinking.  Patient denies any vision changes, headache, neck pain, back pain, numbness/weakness of his arms or legs, facial pain.  Patient is not on blood thinners.  The history is provided by the patient.    Past Medical History:  Diagnosis Date  . Alcohol abuse    drinks 1 gallon of Gin every weekend, nothing during the week x >15 yrs  . Elevated transaminase level    +elev bili: abd u/s 06/2014 showed stable small hepatic hemangiomas, o/w normal.  . Essential hypertension   . GERD (gastroesophageal reflux disease)   . Hyperlipidemia 03/2014   Atorv started-chol improved  . Impaired fasting glucose 03/2014  . Nephrolithiasis   . PUD (peptic ulcer disease)   . Tobacco dependence    chantix: "psych effects"    Patient Active Problem List   Diagnosis Date Noted  . Prediabetes 12/23/2016  . Elevated LFTs 12/23/2016  . Pure hypercholesterolemia  12/21/2016  . Alcoholism ([place]) 12/21/2016  . Eczema 09/18/2013  . Essential hypertension 09/18/2013  . Gastroesophageal reflux disease without esophagitis 09/18/2013  . Tobacco abuse 09/18/2013    Past Surgical History:  Procedure Laterality Date  . COLONOSCOPY  2005   Recall 10 yrs ([place])  . removal of kidney stones  2006   cystoscopic--removed from ureter.  No prob since.        Home Medications    Prior to Admission medications   Medication Sig Start Date End Date Taking? Authorizing Provider  chlorhexidine (PERIDEX) 0.12 % solution Use as directed 15 mLs in the mouth or throat 2 (two) times daily. 09/07/17   Volanda Napoleon, PA-C  clindamycin (CLEOCIN) 150 MG capsule Take 3 capsules (450 mg total) by mouth every 6 (six) hours for 7 days. 09/07/17 09/14/17  Volanda Napoleon, PA-C  halobetasol (ULTRAVATE) 0.05 % cream Apply topically. 09/18/13   [provider]  omeprazole (PRILOSEC) 20 MG capsule Take 20 mg by mouth. 09/18/13   [provider]    Family History Family History  Problem Relation Age of Onset  . Colon cancer Mother   . Cancer Father   . Breast cancer Sister   . Breast cancer Sister     Social History Social History   Tobacco Use  . Smoking status: Current Every Day Smoker    Packs/day: 0.50  Years: 20.00    Pack years: 10.00    Types: Cigarettes  . Smokeless tobacco: Never Used  Substance Use Topics  . Alcohol use: Yes    Alcohol/week: 4.8 oz    Types: 8 Cans of beer per week    Comment: Weekends only  . Drug use: No     Allergies   Penicillins   Review of Systems Review of Systems  Eyes: Negative for visual disturbance.  Respiratory: Negative for shortness of breath.   Cardiovascular: Negative for chest pain.  Musculoskeletal: Negative for back pain and neck pain.  Skin: Positive for wound.  Neurological: Negative for weakness and numbness.     Physical Exam Updated Vital Signs BP (!) 142/104 (BP  Location: Right Arm)   Pulse 96   Temp 98.3 F (36.8 C) (Oral)   Resp 16   Ht 5\' 10"  (1.778 m)   Wt 74.8 kg (165 lb)   SpO2 98%   BMI 23.68 kg/m   Physical Exam  Constitutional: He is oriented to person, place, and time. He appears well-developed and well-nourished.  HENT:  Head: Normocephalic and atraumatic.  Mouth/Throat: Uvula is midline, oropharynx is clear and moist and mucous membranes are normal. Normal dentition.  No tenderness to palpation of skull. No deformities or crepitus noted. No open wounds, abrasions or lacerations.  No tenderness palpation to face.  Elevation/depression and lateral movement of mandible intact without any difficulty.  Patient with a small laceration noted to the anterior aspect of the upper lip.  Does not go through and through.  No lacerations noted to the exterior lip.  Eyes: Pupils are equal, round, and reactive to light. Conjunctivae, EOM and lids are normal.  Neck: Full passive range of motion without pain.  Full flexion/extension and lateral movement of neck fully intact. No bony midline tenderness. No deformities or crepitus.   Cardiovascular: Normal rate, regular rhythm, normal heart sounds and normal pulses. Exam reveals no gallop and no friction rub.  No murmur heard. Pulmonary/Chest: Effort normal and breath sounds normal.  Abdominal: Soft. Normal appearance. There is no tenderness. There is no rigidity and no guarding.  Musculoskeletal: Normal range of motion.  Neurological: He is alert and oriented to person, place, and time.  Follows commands, Moves all extremities  5/5 strength to BUE and BLE  Sensation intact throughout all major nerve distributions  Skin: Skin is warm and dry. Capillary refill takes less than 2 seconds.  Psychiatric: He has a normal mood and affect. His speech is normal.  Nursing note and vitals reviewed.    ED Treatments / Results  Labs (all labs ordered are listed, but only abnormal results are displayed) Labs  Reviewed - No data to display  EKG None  Radiology No results found.  Procedures Procedures (including critical care time)  Medications Ordered in ED Medications - No data to display   Initial Impression / Assessment and Plan / ED Course  I have reviewed the triage vital signs and the nursing notes.  Pertinent labs & imaging results that were available during my care of the patient were reviewed by me and considered in my medical decision making (see chart for details).     67 year old male who presents for evaluation of lip laceration after mechanical fall that occurred yesterday.  Reports he was drinking at the time.  No head injury, LOC, neck pain, vomiting, vision changes, numbness/weakness. Patient is afebrile, non-toxic appearing, sitting comfortably on examination table. Vital signs reviewed and stable.  On evaluation, patient has a small laceration to the anterior aspect of his upper lip.  It does not go through and through.  No external lip laceration.  No neck pain, evidence of head injury.  I discussed with patient that given that the fall was unwitnessed, he was drinking at the time, I would like to do a CT head for evaluation of any intracranial abnormality.  Patient does not wish to have a CT head at this time.  I discussed with both patient and his wife regarding the risk first benefits of declining a CT head, including but not limited to missed intracranial hemorrhage, death.  Patient is clinically sober.  He has no slurring of speech, is able to fully communicate and follow commands.  No evidence of alcohol intoxication at this time.  Both patient and wife expressed understanding and agreement to risks of declining CT at the head at this time the patient was wishes to decline.  We will plan to start patient on antibiotics and antibiotic mouthwash for lip laceration.  Instructed and follow-up with primary care doctor for further evaluation.  Discussed with patient that this  would be leaving [place] as I feel that patient needs a CT head given falls with intoxication.  Patient understands and wishes to proceed with decision.  Final Clinical Impressions(s) / ED Diagnoses   Final diagnoses:  Fall, initial encounter  Lip laceration, initial encounter    ED Discharge Orders        Ordered    chlorhexidine (PERIDEX) 0.12 % solution  2 times daily     09/07/17 1358    clindamycin (CLEOCIN) 150 MG capsule  Every 6 hours     09/07/17 1358       Volanda Napoleon, PA-C 09/07/17 1452    Tegeler, Gwenyth Allegra, MD 09/07/17 [phone]

## 2017-09-07 NOTE — ED Notes (Signed)
ED Provider at bedside. 

## 2017-12-16 ENCOUNTER — Telehealth: Payer: Self-pay | Admitting: Family Medicine

## 2017-12-16 NOTE — Telephone Encounter (Signed)
Needs visit

## 2017-12-16 NOTE — Telephone Encounter (Signed)
MEDICATION: halobetasol (ULTRAVATE) 0.05 % cream  PHARMACY:   [id number] - [place], [place] - [phone] (Phone) [phone] (Fax)    IS THIS A 90 DAY SUPPLY : no  IS PATIENT OUT OF MEDICATION: yes  IF NOT; HOW MUCH IS LEFT:   LAST APPOINTMENT DATE: @10 /02/2017  NEXT APPOINTMENT DATE:@Visit  date not found  OTHER COMMENTS: Aware he may need to come in, patient's wife asked to have it fill.    **Let patient know to contact pharmacy at the end of the day to make sure medication is ready. **  ** Please notify patient to allow 48-72 hours to process**  **Encourage patient to contact the pharmacy for refills or they can request refills through [facility]**

## 2017-12-16 NOTE — Telephone Encounter (Signed)
Only seen one time to est care ok to fill and make him come in for f/u. You have never given to him in the past.

## 2017-12-16 NOTE — Telephone Encounter (Signed)
App made for follow up with patient

## 2017-12-18 ENCOUNTER — Ambulatory Visit (INDEPENDENT_AMBULATORY_CARE_PROVIDER_SITE_OTHER): Payer: BLUE CROSS/BLUE SHIELD | Admitting: Family Medicine

## 2017-12-18 ENCOUNTER — Encounter: Payer: Self-pay | Admitting: Family Medicine

## 2017-12-18 VITALS — BP 140/90 | HR 106 | Temp 98.4°F | Ht 70.0 in | Wt 157.6 lb

## 2017-12-18 DIAGNOSIS — I10 Essential (primary) hypertension: Secondary | ICD-10-CM | POA: Diagnosis not present

## 2017-12-18 DIAGNOSIS — R945 Abnormal results of liver function studies: Secondary | ICD-10-CM

## 2017-12-18 DIAGNOSIS — E7800 Pure hypercholesterolemia, unspecified: Secondary | ICD-10-CM

## 2017-12-18 DIAGNOSIS — F1020 Alcohol dependence, uncomplicated: Secondary | ICD-10-CM | POA: Diagnosis not present

## 2017-12-18 DIAGNOSIS — L309 Dermatitis, unspecified: Secondary | ICD-10-CM

## 2017-12-18 DIAGNOSIS — R7303 Prediabetes: Secondary | ICD-10-CM

## 2017-12-18 DIAGNOSIS — Z720 Tobacco use: Secondary | ICD-10-CM | POA: Diagnosis not present

## 2017-12-18 DIAGNOSIS — R7989 Other specified abnormal findings of blood chemistry: Secondary | ICD-10-CM

## 2017-12-18 LAB — COMPREHENSIVE METABOLIC PANEL
ALT: 16 U/L (ref 0–53)
AST: 23 U/L (ref 0–37)
Albumin: 3.9 g/dL (ref 3.5–5.2)
Alkaline Phosphatase: 93 U/L (ref 39–117)
BUN: 11 mg/dL (ref 6–23)
CO2: 29 mEq/L (ref 19–32)
Calcium: 9.6 mg/dL (ref 8.4–10.5)
Chloride: 102 mEq/L (ref 96–112)
Creatinine, Ser: 1.29 mg/dL (ref 0.40–1.50)
GFR: 71.67 mL/min (ref >60.00)
Glucose, Bld: 109 mg/dL — ABNORMAL HIGH (ref 70–99)
Potassium: 4.2 mEq/L (ref 3.5–5.1)
Sodium: 137 mEq/L (ref 135–145)
Total Bilirubin: 1 mg/dL (ref 0.2–1.2)
Total Protein: 7.6 g/dL (ref 6.0–8.3)

## 2017-12-18 LAB — CBC WITH DIFFERENTIAL/PLATELET
Basophils Absolute: 0 10*3/uL (ref 0.0–0.1)
Basophils Relative: 0.7 % (ref 0.0–3.0)
Eosinophils Absolute: 0.2 10*3/uL (ref 0.0–0.7)
Eosinophils Relative: 3.5 % (ref 0.0–5.0)
HCT: 40.7 % (ref 39.0–52.0)
Hemoglobin: 13.7 g/dL (ref 13.0–17.0)
Lymphocytes Relative: 35.6 % (ref 12.0–46.0)
Lymphs Abs: 1.6 10*3/uL (ref 0.7–4.0)
MCHC: 33.8 g/dL (ref 30.0–36.0)
MCV: 95.1 fl (ref 78.0–100.0)
Monocytes Absolute: 0.5 10*3/uL (ref 0.1–1.0)
Monocytes Relative: 12.6 % — ABNORMAL HIGH (ref 3.0–12.0)
Neutro Abs: 2.1 10*3/uL (ref 1.4–7.7)
Neutrophils Relative %: 47.6 % (ref 43.0–77.0)
Platelets: 180 10*3/uL (ref 150.0–400.0)
RBC: 4.27 Mil/uL (ref 4.22–5.81)
RDW: 15 % (ref 11.5–15.5)
WBC: 4.4 10*3/uL (ref 4.0–10.5)

## 2017-12-18 LAB — TSH: TSH: 2.5 u[IU]/mL (ref 0.35–4.50)

## 2017-12-18 LAB — LIPID PANEL
Cholesterol: 194 mg/dL (ref 0–200)
HDL: 81.7 mg/dL (ref >39.00)
LDL Cholesterol: 98 mg/dL (ref 0–99)
NonHDL: 112.09
Total CHOL/HDL Ratio: 2
Triglycerides: 72 mg/dL (ref 0.0–149.0)
VLDL: 14.4 mg/dL (ref 0.0–40.0)

## 2017-12-18 LAB — VITAMIN B12: Vitamin B-12: 268 pg/mL (ref 211–911)

## 2017-12-18 LAB — HEMOGLOBIN A1C: Hgb A1c MFr Bld: 5.9 % (ref 4.6–6.5)

## 2017-12-18 MED ORDER — HALOBETASOL PROPIONATE 0.05 % EX CREA: TOPICAL_CREAM | Freq: Two times a day (BID) | CUTANEOUS | 3 refills | Status: DC

## 2017-12-18 NOTE — Progress Notes (Signed)
Russell Russell is a 67 y.o. male is here for follow up.  History of Present Illness:   Russell Russell, CMA acting as scribe for Dr. Briscoe Deutscher.   HPI: Patient in office for follow up. He has been on Halobetasol for eczema for several years would like refill today.   Review: not taking medications regularly as instructed, no medication side effects noted, no TIAs, no chest pain on exertion, no dyspnea on exertion, no swelling of ankles.  Smoker: Yes.    Wt Readings from Last 3 Encounters:  12/18/17 157 lb 9.6 oz (71.5 kg)  09/07/17 165 lb (74.8 kg)  12/21/16 155 lb (70.3 kg)   BP Readings from Last 3 Encounters:  12/18/17 140/90  09/07/17 (!) 142/104  12/21/16 130/86   Lab Results  Component Value Date   CREATININE 1.29 12/18/2017   Health Maintenance Due  Topic Date Due  . COLONOSCOPY  03/14/2013   Depression screen PHQ 2/9 12/21/2017 12/21/2016  Decreased Interest 0 0  Down, Depressed, Hopeless 0 0  PHQ - 2 Score 0 0   PMHx, SurgHx, SocialHx, FamHx, Medications, and Allergies were reviewed in the Visit Navigator and updated as appropriate.   Patient Active Problem List   Diagnosis Date Noted  . Prediabetes 12/23/2016  . Elevated LFTs 12/23/2016  . Pure hypercholesterolemia 12/21/2016  . Alcoholism ([place]) 12/21/2016  . Eczema 09/18/2013  . Essential hypertension 09/18/2013  . Gastroesophageal reflux disease without esophagitis 09/18/2013  . Tobacco abuse 09/18/2013   Social History   Tobacco Use  . Smoking status: Current Every Day Smoker    Packs/day: 0.50    Years: 20.00    Pack years: 10.00    Types: Cigarettes  . Smokeless tobacco: Never Used  Substance Use Topics  . Alcohol use: Yes    Alcohol/week: 8.0 standard drinks    Types: 8 Cans of beer per week    Comment: Weekends only  . Drug use: No   Current Medications and Allergies:   Current Outpatient Medications:  .  halobetasol (ULTRAVATE) 0.05 % cream, Apply topically 2 (two) times  daily., Disp: 50 g, Rfl: 3 .  omeprazole (PRILOSEC) 20 MG capsule, Take 20 mg by mouth., Disp: , Rfl:    Allergies  Allergen Reactions  . Penicillins Rash   Review of Systems   Pertinent items are noted in the HPI. Otherwise, ROS is negative.  Vitals:   Vitals:   12/18/17 1424  BP: 140/90  Pulse: (!) 106  Temp: 98.4 F (36.9 C)  TempSrc: Oral  SpO2: 100%  Weight: 157 lb 9.6 oz (71.5 kg)  Height: 5\' 10"  (1.778 m)     Body mass index is 22.61 kg/m.  Physical Exam:   Physical Exam  Constitutional: He is oriented to person, place, and time. He appears well-developed and well-nourished. No distress.  HENT:  Head: Normocephalic and atraumatic.  Right Ear: External ear normal.  Left Ear: External ear normal.  Nose: Nose normal.  Mouth/Throat: Oropharynx is clear and moist.  Eyes: Pupils are equal, round, and reactive to light. Conjunctivae and EOM are normal.  Neck: Normal range of motion. Neck supple.  Cardiovascular: Normal rate, regular rhythm, normal heart sounds and intact distal pulses.  Pulmonary/Chest: Effort normal and breath sounds normal.  Abdominal: Soft. Bowel sounds are normal.  Musculoskeletal: Normal range of motion.  Neurological: He is alert and oriented to person, place, and time.  Skin: Skin is warm and dry.  Irregular nevus on occiput.  Psychiatric: He has a normal mood and affect. His behavior is normal. Judgment and thought content normal.  Nursing note and vitals reviewed.   Assessment and Plan:   Russell Russell was seen today for medication refill.  Diagnoses and all orders for this visit:  Eczema Comments: Continue current treatment.  Orders: -     halobetasol (ULTRAVATE) 0.05 % cream; Apply topically 2 (two) times daily.  Elevated LFTs -     Comprehensive metabolic panel  Insulin resistance -     Hemoglobin A1c  Pure hypercholesterolemia Comments: Patient stopped statin on his own. Orders: -     Comprehensive metabolic panel -      Lipid panel  Essential hypertension Comments: Patient stopped BP medication on his own. Orders: -     CBC with Differential/Platelet -     Comprehensive metabolic panel -     TSH  Tobacco abuse Comments: Down to 1/2 ppd. Orders: -     CBC with Differential/Platelet  Alcoholism ([place]) Comments: Down to 1 pint/weekend. Orders: -     CBC with Differential/Platelet -     Vitamin B12    . Reviewed expectations re: course of current medical issues. . Discussed self-management of symptoms. . Outlined signs and symptoms indicating need for more acute intervention. . Patient verbalized understanding and all questions were answered. Marland Kitchen Health Maintenance issues including appropriate healthy diet, exercise, and smoking avoidance were discussed with patient. . See orders for this visit as documented in the electronic medical record. . Patient received an After Visit Summary.  CMA served as Education administrator during this visit. History, Physical, and Plan performed by medical provider. The above documentation has been reviewed and is accurate and complete. Briscoe Deutscher, D.O.  Briscoe Deutscher, DO [place], [place] [facility] 12/21/2017

## 2017-12-21 ENCOUNTER — Encounter: Payer: Self-pay | Admitting: Family Medicine

## 2018-06-30 ENCOUNTER — Emergency Department (HOSPITAL_COMMUNITY)
Admission: EM | Admit: 2018-06-30 | Discharge: 2018-06-30 | Disposition: A | Discharge status: Home / Self Care | Payer: BLUE CROSS/BLUE SHIELD | Attending: Emergency Medicine | Admitting: Emergency Medicine

## 2018-06-30 ENCOUNTER — Other Ambulatory Visit: Payer: Self-pay

## 2018-06-30 ENCOUNTER — Encounter (HOSPITAL_COMMUNITY): Payer: Self-pay

## 2018-06-30 DIAGNOSIS — F17210 Nicotine dependence, cigarettes, uncomplicated: Secondary | ICD-10-CM | POA: Diagnosis not present

## 2018-06-30 DIAGNOSIS — Z79899 Other long term (current) drug therapy: Secondary | ICD-10-CM | POA: Diagnosis not present

## 2018-06-30 DIAGNOSIS — I10 Essential (primary) hypertension: Secondary | ICD-10-CM | POA: Diagnosis not present

## 2018-06-30 DIAGNOSIS — R531 Weakness: Secondary | ICD-10-CM | POA: Diagnosis present

## 2018-06-30 DIAGNOSIS — R000 Tachycardia, unspecified: Secondary | ICD-10-CM | POA: Diagnosis not present

## 2018-06-30 DIAGNOSIS — E860 Dehydration: Secondary | ICD-10-CM | POA: Diagnosis not present

## 2018-06-30 DIAGNOSIS — R7989 Other specified abnormal findings of blood chemistry: Secondary | ICD-10-CM

## 2018-06-30 LAB — URINALYSIS, ROUTINE W REFLEX MICROSCOPIC
Bacteria, UA: NONE SEEN
Bilirubin Urine: NEGATIVE
Glucose, UA: NEGATIVE mg/dL
Ketones, ur: NEGATIVE mg/dL
Leukocytes,Ua: NEGATIVE
Nitrite: NEGATIVE
Protein, ur: 100 mg/dL — AB
Specific Gravity, Urine: 1.009 (ref 1.005–1.030)
pH: 6 (ref 5.0–8.0)

## 2018-06-30 LAB — CBC WITH DIFFERENTIAL/PLATELET
Abs Immature Granulocytes: 0.01 10*3/uL (ref 0.00–0.07)
Basophils Absolute: 0 10*3/uL (ref 0.0–0.1)
Basophils Relative: 1 %
Eosinophils Absolute: 0.1 10*3/uL (ref 0.0–0.5)
Eosinophils Relative: 2 %
HCT: 43.7 % (ref 39.0–52.0)
Hemoglobin: 15.1 g/dL (ref 13.0–17.0)
Immature Granulocytes: 0 %
Lymphocytes Relative: 15 %
Lymphs Abs: 0.8 10*3/uL (ref 0.7–4.0)
MCH: 32.2 pg (ref 26.0–34.0)
MCHC: 34.6 g/dL (ref 30.0–36.0)
MCV: 93.2 fL (ref 80.0–100.0)
Monocytes Absolute: 0.5 10*3/uL (ref 0.1–1.0)
Monocytes Relative: 10 %
Neutro Abs: 3.7 10*3/uL (ref 1.7–7.7)
Neutrophils Relative %: 72 %
Platelets: 168 10*3/uL (ref 150–400)
RBC: 4.69 MIL/uL (ref 4.22–5.81)
RDW: 15 % (ref 11.5–15.5)
WBC: 5.2 10*3/uL (ref 4.0–10.5)
nRBC: 0 % (ref 0.0–0.2)

## 2018-06-30 LAB — BASIC METABOLIC PANEL
Anion gap: 14 (ref 5–15)
BUN: 13 mg/dL (ref 8–23)
CO2: 22 mmol/L (ref 22–32)
Calcium: 9.6 mg/dL (ref 8.9–10.3)
Chloride: 102 mmol/L (ref 98–111)
Creatinine, Ser: 1.42 mg/dL — ABNORMAL HIGH (ref 0.61–1.24)
GFR calc Af Amer: 59 mL/min — ABNORMAL LOW (ref >60)
GFR calc non Af Amer: 51 mL/min — ABNORMAL LOW (ref >60)
Glucose, Bld: 123 mg/dL — ABNORMAL HIGH (ref 70–99)
Potassium: 4.1 mmol/L (ref 3.5–5.1)
Sodium: 138 mmol/L (ref 135–145)

## 2018-06-30 LAB — TROPONIN I: Troponin I: <0.03 ng/mL (ref <0.03)

## 2018-06-30 MED ORDER — SODIUM CHLORIDE 0.9 % IV BOLUS
500.0000 mL | Freq: Once | INTRAVENOUS | Status: AC
  Administered 2018-06-30: 500 mL via INTRAVENOUS

## 2018-06-30 MED ORDER — METOPROLOL TARTRATE 25 MG PO TABS
25.0000 mg | ORAL_TABLET | Freq: Once | ORAL | Status: AC
  Administered 2018-06-30: 10:00:00 25 mg via ORAL
  Filled 2018-06-30: qty 1

## 2018-06-30 NOTE — ED Notes (Signed)
Wife -An[place]ley [place] [phone]

## 2018-06-30 NOTE — ED Provider Notes (Signed)
[place] [facility] EMERGENCY DEPARTMENT Provider Note   CSN: [id number] Arrival date & time: 06/30/18  9924    History   Chief Complaint Chief Complaint  Patient presents with  . Leg Weakness    HPI Ferd Horrigan is a 68 y.o. male.     Patient is a 68 year old male with a history of hypertension, GERD, hyperlipidemia, peptic ulcer disease and alcohol abuse who presents with tachycardia.  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  He started back at work today.  He suddenly got weak all over and felt like he may fall because of the weakness.  He got diaphoretic and was noted to be tachycardic and hypertensive by EMS.  He denies any chest pain or tightness.  No shortness of breath.  He denies any dizziness or lightheadedness.  He did not feel like he was going to pass out but he felt weak all over.  No unilateral weakness.  No speech deficits or vision changes.  He denies any recent illnesses.  No fevers coughing or cold symptoms.  No abdominal pain.  He does state that he drank about 3 beers yesterday.  He states that he ate and drank okay this morning.  He currently feels better.  He did not take his metoprolol morning dose this morning because he forgot.     Past Medical History:  Diagnosis Date  . Alcohol abuse    drinks 1 gallon of Gin every weekend, nothing during the week x >15 yrs  . Elevated transaminase level    +elev bili: abd u/s 06/2014 showed stable small hepatic hemangiomas, o/w normal.  . Essential hypertension   . GERD (gastroesophageal reflux disease)   . Hyperlipidemia 03/2014   Atorv started-chol improved  . Impaired fasting glucose 03/2014  . Nephrolithiasis   . PUD (peptic ulcer disease)   . Tobacco dependence    chantix: "psych effects"    Patient Active Problem List   Diagnosis Date Noted  . Prediabetes 12/23/2016  . Elevated LFTs 12/23/2016  . Pure hypercholesterolemia 12/21/2016  .  Alcoholism ([place]) 12/21/2016  . Eczema 09/18/2013  . Essential hypertension 09/18/2013  . Gastroesophageal reflux disease without esophagitis 09/18/2013  . Tobacco abuse 09/18/2013    Past Surgical History:  Procedure Laterality Date  . COLONOSCOPY  2005   Recall 10 yrs ([place])  . removal of kidney stones  2006   cystoscopic--removed from ureter.  No prob since.        Home Medications    Prior to Admission medications   Medication Sig Start Date End Date Taking? Authorizing Provider  halobetasol (ULTRAVATE) 0.05 % cream Apply topically 2 (two) times daily. 12/18/17   Briscoe Deutscher, DO  omeprazole (PRILOSEC) 20 MG capsule Take 20 mg by mouth. 09/18/13   [provider]    Family History Family History  Problem Relation Age of Onset  . Colon cancer Mother   . Cancer Father   . Breast cancer Sister   . Breast cancer Sister     Social History Social History   Tobacco Use  . Smoking status: Current Every Day Smoker    Packs/day: 0.50    Years: 20.00    Pack years: 10.00    Types: Cigarettes  . Smokeless tobacco: Never Used  Substance Use Topics  . Alcohol use: Yes    Alcohol/week: 8.0 standard drinks    Types: 8 Cans of beer per  week    Comment: Weekends only  . Drug use: No     Allergies   Penicillins   Review of Systems Review of Systems  Constitutional: Positive for diaphoresis and fatigue. Negative for chills and fever.  HENT: Negative for congestion, rhinorrhea and sneezing.   Eyes: Negative.   Respiratory: Negative for cough, chest tightness and shortness of breath.   Cardiovascular: Positive for palpitations. Negative for chest pain and leg swelling.  Gastrointestinal: Negative for abdominal pain, blood in stool, diarrhea, nausea and vomiting.  Genitourinary: Negative for difficulty urinating, flank pain, frequency and hematuria.  Musculoskeletal: Negative for arthralgias and back pain.  Skin: Negative for rash.  Neurological:  Positive for weakness (Generalized). Negative for dizziness, speech difficulty, numbness and headaches.     Physical Exam Updated Vital Signs BP (!) 174/128   Pulse 91   Temp 98.9 F (37.2 C) (Oral)   Resp (!) 23   Ht 5\' 10"  (1.778 m)   Wt 79.4 kg   SpO2 100%   BMI 25.11 kg/m   Physical Exam Constitutional:      Appearance: He is well-developed.  HENT:     Head: Normocephalic and atraumatic.     Mouth/Throat:     Mouth: Mucous membranes are moist.  Eyes:     Pupils: Pupils are equal, round, and reactive to light.  Neck:     Musculoskeletal: Normal range of motion and neck supple.  Cardiovascular:     Rate and Rhythm: Regular rhythm. Tachycardia present.     Heart sounds: Normal heart sounds.  Pulmonary:     Effort: Pulmonary effort is normal. No respiratory distress.     Breath sounds: Normal breath sounds. No wheezing or rales.  Chest:     Chest wall: No tenderness.  Abdominal:     General: Bowel sounds are normal.     Palpations: Abdomen is soft.     Tenderness: There is no abdominal tenderness. There is no guarding or rebound.  Musculoskeletal: Normal range of motion.  Lymphadenopathy:     Cervical: No cervical adenopathy.  Skin:    General: Skin is warm and dry.     Findings: No rash.  Neurological:     General: No focal deficit present.     Mental Status: He is alert and oriented to person, place, and time.  Psychiatric:        Mood and Affect: Mood normal.      ED Treatments / Results  Labs (all labs ordered are listed, but only abnormal results are displayed) Labs Reviewed  BASIC METABOLIC PANEL - Abnormal; Notable for the following components:      Result Value   Glucose, Bld 123 (*)    Creatinine, Ser 1.42 (*)    GFR calc non Af Amer 51 (*)    GFR calc Af Amer 59 (*)    All other components within normal limits  URINALYSIS, ROUTINE W REFLEX MICROSCOPIC - Abnormal; Notable for the following components:   Hgb urine dipstick SMALL (*)     Protein, ur 100 (*)    All other components within normal limits  TROPONIN I  CBC WITH DIFFERENTIAL/PLATELET    EKG EKG Interpretation  Date/Time:  Monday June 30 2018 09:11:16 EDT Ventricular Rate:  135 PR Interval:    QRS Duration: 72 QT Interval:  296 QTC Calculation: 444 R Axis:   -52 Text Interpretation:  Sinus tachycardia Ventricular premature complex Aberrant complex Left anterior fascicular block Borderline T wave abnormalities No  old tracing to compare Confirmed by Malvin Johns [phone]) on 06/30/2018 9:13:09 AM   Radiology No results found.  Procedures Procedures (including critical care time)  Medications Ordered in ED Medications  sodium chloride 0.9 % bolus 500 mL (0 mLs Intravenous Stopped 06/30/18 1124)  metoprolol tartrate (LOPRESSOR) tablet 25 mg (25 mg Oral Given 06/30/18 1001)     Initial Impression / Assessment and Plan / ED Course  I have reviewed the triage vital signs and the nursing notes.  Pertinent labs & imaging results that were available during my care of the patient were reviewed by me and considered in my medical decision making (see chart for details).        Patient is a 68 year old male who presents with tachycardia.  His heart rate was initially in the 130s.  He was feeling generally weak.  His blood pressure is elevated.  He has no infectious type symptoms.  No stroke symptoms.  No symptoms that sound more concerning for ACS.  After some IV fluids and a dose of his normal Lopressor, he was feeling much better and his heart rate had normalized.  He does admit to drinking alcohol and I feel this is likely contributing to his symptoms today.  He does not appear clinically intoxicated currently.  After treatment he is able to ambulate without symptoms.  He has no dizziness or presyncopal type episodes.  No arrhythmias are noted on EKG.  His labs are otherwise non-concerning.  His creatinine is mildly increased from his prior values.  His glucose  is mildly elevated.  I discussed these findings with the patient and advised that he will need to have this rechecked by his primary care provider.  He was encouraged to take his medications regularly, drink nonalcoholic fluids and return here as needed for any worsening symptoms.  Final Clinical Impressions(s) / ED Diagnoses   Final diagnoses:  Dehydration  Tachycardia  Elevated serum creatinine    ED Discharge Orders    None       Malvin Johns, MD 06/30/18 1238

## 2018-06-30 NOTE — ED Triage Notes (Addendum)
Pt stated that at 0800 when he was returning to his work from break his legs began to progressively weaken as he walked. Denies falling, dizziness, or any limb numbness.

## 2018-06-30 NOTE — ED Notes (Signed)
I Spoke to pt's wife on the phone she was concerned the pt has not told that he is an alcoholic, so she felt inclined to call and tell me.

## 2018-06-30 NOTE — ED Notes (Signed)
Pt ambulated in hallway with steady gait and no complaints. Pt stated that he, "feels way better than he did before."

## 2018-06-30 NOTE — Discharge Instructions (Addendum)
Make sure that you are drinking fluids that are nonalcoholic and non-caffeinated.  Your creatinine (kidney function), was mildly elevated needs to be rechecked by her primary care doctor.  Your glucose was also mildly elevated.  Follow-up with your primary care doctor within the next week.  Return here as needed for any worsening symptoms.

## 2018-09-15 ENCOUNTER — Ambulatory Visit: Payer: Self-pay | Admitting: *Deleted

## 2018-09-15 NOTE — Telephone Encounter (Signed)
Patient experiencing leg weakness seeking clinical advice best # [phone]   Returned call to patient regarding weakness in his legs. He stated he had this happened a few months ago and went to the hospital and had to have IV fluids for dehydration. He denies headache, chest pain, shortness of breath, speech is clear, no falling and no swelling in his legs or lower extremities. Marland Kitchen He feels like he maybe dehydrated. Encourage to drink water and call back for any increase in symptoms. He voiced understanding. Routing to LB at [facility] for review and recommendation.    Reason for Disposition . [1] Weakness of arm / hand, or leg / foot AND [2] is a chronic symptom (recurrent or ongoing AND present > 4 weeks)  Answer Assessment - Initial Assessment Questions 1. SYMPTOM: "What is the main symptom you are concerned about?" (e.g., weakness, numbness)     Weakness and numbness 2. ONSET: "When did this start?" (minutes, hours, days; while sleeping)     today 3. LAST NORMAL: "When was the last time you were normal (no symptoms)?"     Before today 4. PATTERN "Does this come and go, or has it been constant since it started?"  "Is it present now?"     Comes and goes 5. CARDIAC SYMPTOMS: "Have you had any of the following symptoms: chest pain, difficulty breathing, palpitations?"     no 6. NEUROLOGIC SYMPTOMS: "Have you had any of the following symptoms: headache, dizziness, vision loss, double vision, changes in speech, unsteady on your feet?"     no 7. OTHER SYMPTOMS: "Do you have any other symptoms?"     no 8. PREGNANCY: "Is there any chance you are pregnant?" "When was your last menstrual period?"     no  Protocols used: NEUROLOGIC DEFICIT-A-AH

## 2018-09-16 ENCOUNTER — Telehealth: Payer: Self-pay | Admitting: Family Medicine

## 2018-09-16 NOTE — Telephone Encounter (Signed)
See note. Can one of you follow up and find out what patient needs?

## 2018-09-16 NOTE — Telephone Encounter (Signed)
JoEllen contacted the patient and has already advised. Please see nurse triage note. No further action required.

## 2018-09-16 NOTE — Telephone Encounter (Signed)
TO ED for evaluation

## 2018-09-16 NOTE — Telephone Encounter (Signed)
Called patient we have not spoke to him any way it could be from [employer]

## 2018-09-16 NOTE — Telephone Encounter (Signed)
Called patient states that he feels better today that the nurse told  Him to drink more water and that he feels better. He was told by doctor where he works that he needs letter to come back. Explained to patient that we are not able to give that note with out evaluation. Advised that Dr. [name] wanted him to go to ED for evaluation. He declines but will go to walk in to see if letter can be given by them. He will call our office if any further questions.

## 2018-09-16 NOTE — Telephone Encounter (Signed)
Patient's wife called on 09/16/18 saying that she spoke with someone who said our office would have a note ready for her or pt to pick up stating that the pt had talked with Dr. [name] and was OK to return to work on 09/17/18. I did not see the note up front here or any mention of it in pts chart. Please call pt's wife back at [phone] and let her know once note is ready for pick up or if pt needs to schedule appt. Thank you so much!

## 2018-09-16 NOTE — Telephone Encounter (Signed)
Patient wife called back in reference to the note that was requested. Also states that she lives in [place] and need ample time to drive up to get the note for her husband so he can go back to work tomorrow.

## 2019-02-24 ENCOUNTER — Encounter: Payer: Self-pay | Admitting: Physician Assistant

## 2019-02-24 ENCOUNTER — Other Ambulatory Visit: Payer: Self-pay

## 2019-02-24 ENCOUNTER — Ambulatory Visit (INDEPENDENT_AMBULATORY_CARE_PROVIDER_SITE_OTHER): Payer: BC Managed Care – PPO | Admitting: Physician Assistant

## 2019-02-24 VITALS — BP 172/136 | HR 97 | Temp 97.1°F | Ht 70.0 in | Wt 149.2 lb

## 2019-02-24 DIAGNOSIS — R634 Abnormal weight loss: Secondary | ICD-10-CM

## 2019-02-24 DIAGNOSIS — E7800 Pure hypercholesterolemia, unspecified: Secondary | ICD-10-CM

## 2019-02-24 DIAGNOSIS — F1020 Alcohol dependence, uncomplicated: Secondary | ICD-10-CM | POA: Diagnosis not present

## 2019-02-24 DIAGNOSIS — I10 Essential (primary) hypertension: Secondary | ICD-10-CM | POA: Diagnosis not present

## 2019-02-24 MED ORDER — AMLODIPINE BESYLATE 5 MG PO TABS: 5.0000 mg | ORAL_TABLET | Freq: Every day | ORAL | 1 refills | Status: DC

## 2019-02-24 NOTE — Patient Instructions (Addendum)
It was great to see you!  Start 5 mg Norvasc daily. Keep tabs on your blood pressure for me.   Please work on alcohol reduction -- this is important! We will call you with your lab results and further recommendations.  Let's follow-up in 4 weeks, sooner if you have concerns.  Please  Take care,  Inda Coke PA-C

## 2019-02-24 NOTE — Progress Notes (Signed)
Russell Russell is a 68 y.o. male is here for transfer of care.  I acted as a Education administrator for [employer], PA-C Anselmo Pickler, LPN  History of Present Illness:   Chief Complaint  Patient presents with  . Transfer of care    HPI   Hypertension Pt following up on blood pressure. He does check his blood pressure at home twice a day. Averaging systolic 0000000 and diastolic [id number]. Pt denies headaches, dizziness, blurred vision, chest pain, SOB or lower leg edema. Denies excessive caffeine intake, stimulant usage or increase in salt consumption. Pt said he drinks about a 5th of liquor a month.  Back in 2018 was on: Lisinopril 20 mg and Metoprolol succinate 50 mg, but he stopped these because he "felt like it."  BP Readings from Last 3 Encounters:  02/24/19 (!) 172/136  06/30/18 (!) 165/130  12/18/17 140/90   HLD Was on lipitor 40 mg daily at one point but stopped this. He is unable to tell me why.  Weight loss -- 26 lb x 8 months Retired one month ago. Reports that since that time he has not been eating properly, and possibly drinking more. Eats about two times a day. Breakfast is biscuit and hashbrowns, sweetened tea. Lunch is [employer] -- gets sandwich and fries. Doesn't typically stay awake for dinner.  Denies: night sweats, rectal bleeding, changes in energy level, depression, chest pain, SOB  Wt Readings from Last 3 Encounters:  02/24/19 149 lb 4 oz (67.7 kg)  06/30/18 175 lb (79.4 kg)  12/18/17 157 lb 9.6 oz (71.5 kg)    Alcohol abuse Drinks 1 case beer a week and 1 fifth of liquor 1-2 times per month. Will start drinking around noon, after he eats breakfast. Drinks mostly on the weekends. Denies falls, drinking daily, need to drink first thing in the morning.     Health Maintenance Due  Topic Date Due  . COLONOSCOPY  03/14/2013  . TETANUS/TDAP  03/12/2016  . INFLUENZA VACCINE  10/11/2018    Past Medical History:  Diagnosis Date  . Alcohol abuse     drinks 1 gallon of Gin every weekend, nothing during the week x >15 yrs  . Elevated transaminase level    +elev bili: abd u/s 06/2014 showed stable small hepatic hemangiomas, o/w normal.  . Essential hypertension   . GERD (gastroesophageal reflux disease)   . Hyperlipidemia 03/2014   Atorv started-chol improved  . Impaired fasting glucose 03/2014  . Nephrolithiasis   . PUD (peptic ulcer disease)   . Tobacco dependence    chantix: "psych effects"     Social History   Socioeconomic History  . Marital status: Married    Spouse name: Not on file  . Number of children: Not on file  . Years of education: Not on file  . Highest education level: Not on file  Occupational History  . Not on file  Tobacco Use  . Smoking status: Current Every Day Smoker    Packs/day: 0.50    Years: 20.00    Pack years: 10.00    Types: Cigarettes  . Smokeless tobacco: Never Used  Substance and Sexual Activity  . Alcohol use: Yes    Alcohol/week: 8.0 standard drinks    Types: 8 Cans of beer per week    Comment: Weekends only  . Drug use: No  . Sexual activity: Not on file  Other Topics Concern  . Not on file  Social History Narrative   Married, no children.  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   Tob: 10 pack-yr hx (current as of 03/2014).   Alcohol: 8 beers and pint of whisky on weekends only.   Denies hx of prob with drugs or alcohol.   Social Determinants of Health   Financial Resource Strain:   . Difficulty of Paying Living Expenses: Not on file  Food Insecurity:   . Worried About Charity fundraiser in the Last Year: Not on file  . Ran Out of Food in the Last Year: Not on file  Transportation Needs:   . Lack of Transportation (Medical): Not on file  . Lack of Transportation (Non-Medical): Not on file  Physical Activity:   . Days of Exercise per Week: Not on file  . Minutes of Exercise per Session: Not on file  Stress:   . Feeling of  Stress : Not on file  Social Connections:   . Frequency of Communication with Friends and Family: Not on file  . Frequency of Social Gatherings with Friends and Family: Not on file  . Attends Religious Services: Not on file  . Active Member of Clubs or Organizations: Not on file  . Attends Archivist Meetings: Not on file  . Marital Status: Not on file  Intimate Partner Violence:   . Fear of Current or Ex-Partner: Not on file  . Emotionally Abused: Not on file  . Physically Abused: Not on file  . Sexually Abused: Not on file    Past Surgical History:  Procedure Laterality Date  . COLONOSCOPY  2005   Recall 10 yrs ([place])  . removal of kidney stones  2006   cystoscopic--removed from ureter.  No prob since.    Family History  Problem Relation Age of Onset  . Colon cancer Mother   . Cancer Father   . Breast cancer Sister   . Breast cancer Sister     PMHx, SurgHx, SocialHx, FamHx, Medications, and Allergies were reviewed in the Visit Navigator and updated as appropriate.   Patient Active Problem List   Diagnosis Date Noted  . Prediabetes 12/23/2016  . Elevated LFTs 12/23/2016  . Pure hypercholesterolemia 12/21/2016  . Alcoholism ([place]) 12/21/2016  . Eczema 09/18/2013  . Essential hypertension 09/18/2013  . Gastroesophageal reflux disease without esophagitis 09/18/2013  . Tobacco abuse 09/18/2013    Social History   Tobacco Use  . Smoking status: Current Every Day Smoker    Packs/day: 0.50    Years: 20.00    Pack years: 10.00    Types: Cigarettes  . Smokeless tobacco: Never Used  Substance Use Topics  . Alcohol use: Yes    Alcohol/week: 8.0 standard drinks    Types: 8 Cans of beer per week    Comment: Weekends only  . Drug use: No    Current Medications and Allergies:    Current Outpatient Medications:  .  halobetasol (ULTRAVATE) 0.05 % cream, Apply topically 2 (two) times daily., Disp: 50 g, Rfl: 3 .  omeprazole (PRILOSEC) 20 MG capsule,  Take 20 mg by mouth., Disp: , Rfl:  .  amLODipine (NORVASC) 5 MG tablet, Take 1 tablet (5 mg total) by mouth daily., Disp: 30 tablet, Rfl: 1   Allergies  Allergen Reactions  . Penicillins Rash    Review of Systems   ROS Negative unless otherwise specified per HPI.  Vitals:   Vitals:   02/24/19 1427 02/24/19 1506  BP: (!) 179/128 (!) 172/136  Pulse:  100 97  Temp: (!) 97.1 F (36.2 C)   TempSrc: Temporal   SpO2: 99%   Weight: 149 lb 4 oz (67.7 kg)   Height: 5\' 10"  (1.778 m)      Body mass index is 21.42 kg/m.   Physical Exam:    Physical Exam Vitals and nursing note reviewed.  Constitutional:      General: He is not in acute distress.    Appearance: He is well-developed. He is not ill-appearing or toxic-appearing.  Cardiovascular:     Rate and Rhythm: Normal rate and regular rhythm.     Pulses: Normal pulses.     Heart sounds: Normal heart sounds, S1 normal and S2 normal.     Comments: No LE edema Pulmonary:     Effort: Pulmonary effort is normal.     Breath sounds: Normal breath sounds.  Skin:    General: Skin is warm and dry.  Neurological:     Mental Status: He is alert.     GCS: GCS eye subscore is 4. GCS verbal subscore is 5. GCS motor subscore is 6.  Psychiatric:        Speech: Speech normal.        Behavior: Behavior normal. Behavior is cooperative.      Assessment and Plan:    Russell Russell was seen today for transfer of care.  Diagnoses and all orders for this visit:  Essential hypertension Uncontrolled. Will start Norvasc 5 mg, keep blood pressure log, and follow-up in 4 weeks, sooner if concerns. -     Comprehensive metabolic panel -     TSH -     CBC with Differential/Platelet  Pure hypercholesterolemia Update lipid panel and ASCVD, likely resume lipitor. -     Lipid panel  Alcoholism ([place]) Counseled briefly, patient has no desires to quit.  Unintentional weight loss Labs today. Encouraged adequate cal/pro intake throughout the day.  Follow-up in 1 month for weight check when we check his BP.  Other orders -     amLODipine (NORVASC) 5 MG tablet; Take 1 tablet (5 mg total) by mouth daily.  . Reviewed expectations re: course of current medical issues. . Discussed self-management of symptoms. . Outlined signs and symptoms indicating need for more acute intervention. . Patient verbalized understanding and all questions were answered. . See orders for this visit as documented in the electronic medical record. . Patient received an After Visit Summary.  CMA or LPN served as scribe during this visit. History, Physical, and Plan performed by medical provider. The above documentation has been reviewed and is accurate and complete.   Inda Coke, PA-C [place] [place] 02/24/2019  Follow-up: No follow-ups on file.

## 2019-02-25 ENCOUNTER — Other Ambulatory Visit: Payer: Self-pay | Admitting: Physician Assistant

## 2019-02-25 LAB — CBC WITH DIFFERENTIAL/PLATELET
Basophils Absolute: 0.1 10*3/uL (ref 0.0–0.1)
Basophils Relative: 1.5 % (ref 0.0–3.0)
Eosinophils Absolute: 0.2 10*3/uL (ref 0.0–0.7)
Eosinophils Relative: 4.7 % (ref 0.0–5.0)
HCT: 51.8 % (ref 39.0–52.0)
Hemoglobin: 17.3 g/dL — ABNORMAL HIGH (ref 13.0–17.0)
Lymphocytes Relative: 33.1 % (ref 12.0–46.0)
Lymphs Abs: 1.3 10*3/uL (ref 0.7–4.0)
MCHC: 33.4 g/dL (ref 30.0–36.0)
MCV: 99.2 fl (ref 78.0–100.0)
Monocytes Absolute: 0.6 10*3/uL (ref 0.1–1.0)
Monocytes Relative: 14.2 % — ABNORMAL HIGH (ref 3.0–12.0)
Neutro Abs: 1.8 10*3/uL (ref 1.4–7.7)
Neutrophils Relative %: 46.5 % (ref 43.0–77.0)
Platelets: 210 10*3/uL (ref 150.0–400.0)
RBC: 5.23 Mil/uL (ref 4.22–5.81)
RDW: 17.4 % — ABNORMAL HIGH (ref 11.5–15.5)
WBC: 3.9 10*3/uL — ABNORMAL LOW (ref 4.0–10.5)

## 2019-02-25 LAB — COMPREHENSIVE METABOLIC PANEL
ALT: 23 U/L (ref 0–53)
AST: 38 U/L — ABNORMAL HIGH (ref 0–37)
Albumin: 3.9 g/dL (ref 3.5–5.2)
Alkaline Phosphatase: 108 U/L (ref 39–117)
BUN: 8 mg/dL (ref 6–23)
CO2: 27 mEq/L (ref 19–32)
Calcium: 9.8 mg/dL (ref 8.4–10.5)
Chloride: 102 mEq/L (ref 96–112)
Creatinine, Ser: 1.16 mg/dL (ref 0.40–1.50)
GFR: 75.95 mL/min (ref >60.00)
Glucose, Bld: 120 mg/dL — ABNORMAL HIGH (ref 70–99)
Potassium: 5.2 mEq/L — ABNORMAL HIGH (ref 3.5–5.1)
Sodium: 138 mEq/L (ref 135–145)
Total Bilirubin: 1.3 mg/dL — ABNORMAL HIGH (ref 0.2–1.2)
Total Protein: 7.6 g/dL (ref 6.0–8.3)

## 2019-02-25 LAB — LIPID PANEL
Cholesterol: 220 mg/dL — ABNORMAL HIGH (ref 0–200)
HDL: 85.7 mg/dL (ref >39.00)
LDL Cholesterol: 121 mg/dL — ABNORMAL HIGH (ref 0–99)
NonHDL: 134.73
Total CHOL/HDL Ratio: 3
Triglycerides: 71 mg/dL (ref 0.0–149.0)
VLDL: 14.2 mg/dL (ref 0.0–40.0)

## 2019-02-25 LAB — TSH: TSH: 1.91 u[IU]/mL (ref 0.35–4.50)

## 2019-02-25 MED ORDER — ATORVASTATIN CALCIUM 40 MG PO TABS: 40.0000 mg | ORAL_TABLET | Freq: Every day | ORAL | 1 refills | Status: DC

## 2019-02-27 ENCOUNTER — Telehealth: Payer: Self-pay

## 2019-02-27 ENCOUNTER — Telehealth: Payer: Self-pay | Admitting: Physician Assistant

## 2019-02-27 NOTE — Telephone Encounter (Signed)
See note  Copied from [place] [phone]. Topic: General - Other >> Feb 27, 2019 12:46 PM Mcneil, Ja-Kwan wrote: Reason for CRM: Pt wife stated they were just returning call for lab results. Pt wife requests call back.

## 2019-02-27 NOTE — Telephone Encounter (Signed)
Pt.'s wife called back, given lab results and instructions. Verbalizes understanding. Wife states "he's not going to take that cholesterol medicine. It' like pulling teeth to get him to take medicine." Will call as needed.

## 2019-02-27 NOTE — Telephone Encounter (Signed)
See result notes. 

## 2019-03-13 DIAGNOSIS — I639 Cerebral infarction, unspecified: Secondary | ICD-10-CM

## 2019-03-13 HISTORY — DX: Cerebral infarction, unspecified: I63.9

## 2019-03-18 ENCOUNTER — Other Ambulatory Visit: Payer: Self-pay | Admitting: Physician Assistant

## 2019-03-24 ENCOUNTER — Ambulatory Visit (INDEPENDENT_AMBULATORY_CARE_PROVIDER_SITE_OTHER): Payer: BC Managed Care – PPO | Admitting: Physician Assistant

## 2019-03-24 ENCOUNTER — Encounter: Payer: Self-pay | Admitting: Physician Assistant

## 2019-03-24 ENCOUNTER — Other Ambulatory Visit: Payer: Self-pay

## 2019-03-24 VITALS — BP 150/100 | HR 124 | Temp 97.2°F | Ht 70.0 in | Wt 147.4 lb

## 2019-03-24 DIAGNOSIS — F1020 Alcohol dependence, uncomplicated: Secondary | ICD-10-CM | POA: Diagnosis not present

## 2019-03-24 DIAGNOSIS — R634 Abnormal weight loss: Secondary | ICD-10-CM | POA: Diagnosis not present

## 2019-03-24 DIAGNOSIS — Z1211 Encounter for screening for malignant neoplasm of colon: Secondary | ICD-10-CM | POA: Diagnosis not present

## 2019-03-24 DIAGNOSIS — I10 Essential (primary) hypertension: Secondary | ICD-10-CM

## 2019-03-24 MED ORDER — AMLODIPINE BESYLATE 10 MG PO TABS: 10.0000 mg | ORAL_TABLET | Freq: Every day | ORAL | 1 refills | Status: DC

## 2019-03-24 NOTE — Progress Notes (Signed)
Russell Russell is a 69 y.o. male is here to follow up on hypertension   I acted as a Education administrator for [employer], PA-C Anselmo Pickler, LPN  History of Present Illness:   Chief Complaint  Patient presents with  . Hypertension    HPI   Hypertension Pt is following up today on blood pressure. He was started on Norvasc 5 mg at our last visit. Pt is checking blood pressure daily he says but forgets to write it down, averaging 140's/80's. Pt denies headaches, dizziness, blurred vision, chest pain, SOB or lower leg edema. Denies excessive caffeine intake, stimulant usage, or increase in salt consumption.   BP Readings from Last 3 Encounters:  03/24/19 (!) 150/100  02/24/19 (!) 172/136  06/30/18 (!) 165/130    Alcoholism Pt says he has cut down alcohol to 2-3 beers a day. Continues to have issues with being bored since retired. He reports that his wife has noticed he has cut down on his drinking.  Unintentional weight loss Ongoing. Down another two pounds since he last saw me. He reports that he just isn't hungry throughout the day. Has to force himself to eat. Denies: excessive activity, nausea, vomiting, rectal bleeding, abdominal pain  Wt Readings from Last 5 Encounters:  03/24/19 147 lb 6.1 oz (66.9 kg)  02/24/19 149 lb 4 oz (67.7 kg)  06/30/18 175 lb (79.4 kg)  12/18/17 157 lb 9.6 oz (71.5 kg)  09/07/17 165 lb (74.8 kg)   Overdue on colonoscopy. Willing to do IFOB.  Health Maintenance Due  Topic Date Due  . COLONOSCOPY  03/14/2013    Past Medical History:  Diagnosis Date  . Alcohol abuse    drinks 1 gallon of Gin every weekend, nothing during the week x >15 yrs  . Elevated transaminase level    +elev bili: abd u/s 06/2014 showed stable small hepatic hemangiomas, o/w normal.  . Essential hypertension   . GERD (gastroesophageal reflux disease)   . Hyperlipidemia 03/2014   Atorv started-chol improved  . Impaired fasting glucose 03/2014  . Nephrolithiasis   . PUD  (peptic ulcer disease)   . Tobacco dependence    chantix: "psych effects"     Social History   Socioeconomic History  . Marital status: Married    Spouse name: Not on file  . Number of children: Not on file  . Years of education: Not on file  . Highest education level: Not on file  Occupational History  . Not on file  Tobacco Use  . Smoking status: Current Every Day Smoker    Packs/day: 0.50    Years: 20.00    Pack years: 10.00    Types: Cigarettes  . Smokeless tobacco: Never Used  Substance and Sexual Activity  . Alcohol use: Yes    Alcohol/week: 8.0 standard drinks    Types: 8 Cans of beer per week    Comment: Weekends only  . Drug use: No  . Sexual activity: Not on file  Other Topics Concern  . Not on file  Social History Narrative   Married, no children.   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   Tob: 10 pack-yr hx (current as of 03/2014).   Alcohol: 8 beers and pint of whisky on weekends only.   Denies hx of prob with drugs or alcohol.   Social Determinants of Health   Financial Resource Strain:   . Difficulty of Paying Living Expenses: Not on file  Food Insecurity:   . Worried About Charity fundraiser in the Last Year: Not on file  . Ran Out of Food in the Last Year: Not on file  Transportation Needs:   . Lack of Transportation (Medical): Not on file  . Lack of Transportation (Non-Medical): Not on file  Physical Activity:   . Days of Exercise per Week: Not on file  . Minutes of Exercise per Session: Not on file  Stress:   . Feeling of Stress : Not on file  Social Connections:   . Frequency of Communication with Friends and Family: Not on file  . Frequency of Social Gatherings with Friends and Family: Not on file  . Attends Religious Services: Not on file  . Active Member of Clubs or Organizations: Not on file  . Attends Archivist Meetings: Not on file  . Marital Status: Not on file  Intimate  Partner Violence:   . Fear of Current or Ex-Partner: Not on file  . Emotionally Abused: Not on file  . Physically Abused: Not on file  . Sexually Abused: Not on file    Past Surgical History:  Procedure Laterality Date  . COLONOSCOPY  2005   Recall 10 yrs ([place])  . removal of kidney stones  2006   cystoscopic--removed from ureter.  No prob since.    Family History  Problem Relation Age of Onset  . Colon cancer Mother   . Cancer Father   . Breast cancer Sister   . Breast cancer Sister     PMHx, SurgHx, SocialHx, FamHx, Medications, and Allergies were reviewed in the Visit Navigator and updated as appropriate.   Patient Active Problem List   Diagnosis Date Noted  . Prediabetes 12/23/2016  . Elevated LFTs 12/23/2016  . Pure hypercholesterolemia 12/21/2016  . Alcoholism ([place]) 12/21/2016  . Eczema 09/18/2013  . Essential hypertension 09/18/2013  . Gastroesophageal reflux disease without esophagitis 09/18/2013  . Tobacco abuse 09/18/2013    Social History   Tobacco Use  . Smoking status: Current Every Day Smoker    Packs/day: 0.50    Years: 20.00    Pack years: 10.00    Types: Cigarettes  . Smokeless tobacco: Never Used  Substance Use Topics  . Alcohol use: Yes    Alcohol/week: 8.0 standard drinks    Types: 8 Cans of beer per week    Comment: Weekends only  . Drug use: No    Current Medications and Allergies:    Current Outpatient Medications:  .  atorvastatin (LIPITOR) 40 MG tablet, Take 1 tablet (40 mg total) by mouth daily., Disp: 90 tablet, Rfl: 1 .  halobetasol (ULTRAVATE) 0.05 % cream, Apply topically 2 (two) times daily., Disp: 50 g, Rfl: 3 .  omeprazole (PRILOSEC) 20 MG capsule, Take 20 mg by mouth., Disp: , Rfl:  .  amLODipine (NORVASC) 10 MG tablet, Take 1 tablet (10 mg total) by mouth daily., Disp: 30 tablet, Rfl: 1   Allergies  Allergen Reactions  . Penicillins Rash    Review of Systems   ROS  Negative unless otherwise specified per  HPI.  Vitals:   Vitals:   03/24/19 0951  BP: (!) 150/100  Pulse: (!) 124  Temp: (!) 97.2 F (36.2 C)  TempSrc: Temporal  SpO2: 98%  Weight: 147 lb 6.1 oz (66.9 kg)  Height: 5\' 10"  (1.778 m)     Body mass index is 21.15 kg/m.   Physical Exam:    Physical Exam Vitals and nursing  note reviewed.  Constitutional:      General: He is not in acute distress.    Appearance: He is well-developed. He is not ill-appearing or toxic-appearing.  Cardiovascular:     Rate and Rhythm: Regular rhythm. Tachycardia present.     Pulses: Normal pulses.     Heart sounds: Normal heart sounds, S1 normal and S2 normal.     Comments: No LE edema Pulmonary:     Effort: Pulmonary effort is normal.     Breath sounds: Normal breath sounds.  Skin:    General: Skin is warm and dry.  Neurological:     Mental Status: He is alert.     GCS: GCS eye subscore is 4. GCS verbal subscore is 5. GCS motor subscore is 6.  Psychiatric:        Speech: Speech normal.        Behavior: Behavior normal. Behavior is cooperative.      Assessment and Plan:    Fermon was seen today for hypertension.  Diagnoses and all orders for this visit:  Essential hypertension Improved but uncontrolled. Will increase Norvasc to 10 mg daily. Follow-up in 1 month, sooner if concerns.  Alcoholism ([place]) Continue counseling. I have asked him to bring his wife for his follow-up visit.  Unintentional weight loss Ongoing. Discussed doing work-up today vs hard work on increasing calories. He would like to defer work-up for today. Follow-up in 1 month. If weight has decreased, will order appropriate labs and imaging as indicated.  Special screening for malignant neoplasms, colon -     Fecal occult blood, imunochemical; Future  Other orders -     amLODipine (NORVASC) 10 MG tablet; Take 1 tablet (10 mg total) by mouth daily.  . Reviewed expectations re: course of current medical issues. . Discussed self-management of  symptoms. . Outlined signs and symptoms indicating need for more acute intervention. . Patient verbalized understanding and all questions were answered. . See orders for this visit as documented in the electronic medical record. . Patient received an After Visit Summary.  CMA or LPN served as scribe during this visit. History, Physical, and Plan performed by medical provider. The above documentation has been reviewed and is accurate and complete.   Inda Coke, PA-C , [place] 03/24/2019  Follow-up: No follow-ups on file.

## 2019-03-24 NOTE — Patient Instructions (Addendum)
It was great to see you!  Follow-up in 1 month. Please bring your wife with you.  Please work on stabilizing your weight, or gaining if you can.  Increase your Norvasc to 10 mg daily.  Take care,  Inda Coke PA-C

## 2019-04-23 NOTE — Progress Notes (Signed)
Russell Russell is a 69 y.o. male is here to discuss: Hypertension and ETOH  I acted as a Education administrator for [employer], PA-C Anselmo Pickler, LPN  History of Present Illness:   Chief Complaint  Patient presents with  . Hypertension  . Alcohol Intoxication    HPI   Hypertension Pt following up today on blood pressure. He has not been checking blood pressure at home.  Currently prescribed Norvasc 10 mg, was increased at last visit 03/24/2019. He is not taking his medication. Patient denies headaches, dizziness, blurred vision, chest pain, SOB or lower leg edema.   Alcoholism Pt is drinking 3 beers a day and 2 glasses of wine daily, drinks "a lot more" on the weekends. He is recently retired and has nothing to do in his free time. He denies recent falls. He denies depression however he states that he has no motivation to do anything, no reason to get out of bed, nothing to do or anyone to talk to during the day other than drink.    Health Maintenance Due  Topic Date Due  . COLONOSCOPY  03/14/2013    Past Medical History:  Diagnosis Date  . Alcohol abuse    drinks 1 gallon of Gin every weekend, nothing during the week x >15 yrs  . Elevated transaminase level    +elev bili: abd u/s 06/2014 showed stable small hepatic hemangiomas, o/w normal.  . Essential hypertension   . GERD (gastroesophageal reflux disease)   . Hyperlipidemia 03/2014   Atorv started-chol improved  . Impaired fasting glucose 03/2014  . Nephrolithiasis   . PUD (peptic ulcer disease)   . Tobacco dependence    chantix: "psych effects"     Social History   Socioeconomic History  . Marital status: Married    Spouse name: Not on file  . Number of children: Not on file  . Years of education: Not on file  . Highest education level: Not on file  Occupational History  . Not on file  Tobacco Use  . Smoking status: Current Every Day Smoker    Packs/day: 0.50    Years: 20.00    Pack years: 10.00    Types:  Cigarettes  . Smokeless tobacco: Never Used  Substance and Sexual Activity  . Alcohol use: Yes    Alcohol/week: 8.0 standard drinks    Types: 8 Cans of beer per week    Comment: Weekends only  . Drug use: No  . Sexual activity: Not on file  Other Topics Concern  . Not on file  Social History Narrative   Married, no children.   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   Tob: 10 pack-yr hx (current as of 03/2014).   Alcohol: 8 beers and pint of whisky on weekends only.   Denies hx of prob with drugs or alcohol.   Social Determinants of Health   Financial Resource Strain:   . Difficulty of Paying Living Expenses: Not on file  Food Insecurity:   . Worried About Charity fundraiser in the Last Year: Not on file  . Ran Out of Food in the Last Year: Not on file  Transportation Needs:   . Lack of Transportation (Medical): Not on file  . Lack of Transportation (Non-Medical): Not on file  Physical Activity:   . Days of Exercise per Week: Not on file  . Minutes of Exercise per Session: Not on file  Stress:   .  Feeling of Stress : Not on file  Social Connections:   . Frequency of Communication with Friends and Family: Not on file  . Frequency of Social Gatherings with Friends and Family: Not on file  . Attends Religious Services: Not on file  . Active Member of Clubs or Organizations: Not on file  . Attends Archivist Meetings: Not on file  . Marital Status: Not on file  Intimate Partner Violence:   . Fear of Current or Ex-Partner: Not on file  . Emotionally Abused: Not on file  . Physically Abused: Not on file  . Sexually Abused: Not on file    Past Surgical History:  Procedure Laterality Date  . COLONOSCOPY  2005   Recall 10 yrs ([place])  . removal of kidney stones  2006   cystoscopic--removed from ureter.  No prob since.    Family History  Problem Relation Age of Onset  . Colon cancer Mother   . Cancer Father     . Breast cancer Sister   . Breast cancer Sister     PMHx, SurgHx, SocialHx, FamHx, Medications, and Allergies were reviewed in the Visit Navigator and updated as appropriate.   Patient Active Problem List   Diagnosis Date Noted  . Prediabetes 12/23/2016  . Elevated LFTs 12/23/2016  . Pure hypercholesterolemia 12/21/2016  . Alcoholism ([place]) 12/21/2016  . Eczema 09/18/2013  . Essential hypertension 09/18/2013  . Gastroesophageal reflux disease without esophagitis 09/18/2013  . Tobacco abuse 09/18/2013    Social History   Tobacco Use  . Smoking status: Current Every Day Smoker    Packs/day: 0.50    Years: 20.00    Pack years: 10.00    Types: Cigarettes  . Smokeless tobacco: Never Used  Substance Use Topics  . Alcohol use: Yes    Alcohol/week: 8.0 standard drinks    Types: 8 Cans of beer per week    Comment: Weekends only  . Drug use: No    Current Medications and Allergies:    Current Outpatient Medications:  .  amLODipine (NORVASC) 10 MG tablet, Take 1 tablet (10 mg total) by mouth daily. (Patient not taking: Reported on 04/24/2019), Disp: 30 tablet, Rfl: 1 .  atorvastatin (LIPITOR) 40 MG tablet, Take 1 tablet (40 mg total) by mouth daily. (Patient not taking: Reported on 04/24/2019), Disp: 90 tablet, Rfl: 1 .  halobetasol (ULTRAVATE) 0.05 % cream, Apply topically 2 (two) times daily. (Patient not taking: Reported on 04/24/2019), Disp: 50 g, Rfl: 3 .  omeprazole (PRILOSEC) 20 MG capsule, Take 20 mg by mouth., Disp: , Rfl:    Allergies  Allergen Reactions  . Penicillins Rash    Review of Systems   ROS  Negative unless otherwise specified per HPI.  Vitals:   Vitals:   04/24/19 0852  BP: (!) 150/100  Pulse: (!) 105  Temp: (!) 97 F (36.1 C)  TempSrc: Temporal  SpO2: 100%  Weight: 144 lb 4 oz (65.4 kg)  Height: 5\' 10"  (1.778 m)     Body mass index is 20.7 kg/m.   Physical Exam:    Physical Exam Vitals and nursing note reviewed.  Constitutional:       General: He is not in acute distress.    Appearance: He is well-developed. He is not ill-appearing or toxic-appearing.  Cardiovascular:     Rate and Rhythm: Regular rhythm. Tachycardia present.     Pulses: Normal pulses.     Heart sounds: Normal heart sounds, S1 normal and S2 normal.  Comments: No LE edema Pulmonary:     Effort: Pulmonary effort is normal.     Breath sounds: Normal breath sounds.  Skin:    General: Skin is warm and moist.  Neurological:     Mental Status: He is alert.     GCS: GCS eye subscore is 4. GCS verbal subscore is 5. GCS motor subscore is 6.  Psychiatric:        Speech: Speech normal.        Behavior: Behavior normal. Behavior is cooperative.      Assessment and Plan:    Keshaun was seen today for hypertension and alcohol intoxication.  Diagnoses and all orders for this visit:  Alcoholism [facility]) Patient is interested in quitting. Due to the amount of alcohol he is currently consuming, his tachycardia and uncontrolled blood pressure, I have recommended that he go to the ER at [facility] for further evaluation and to be evaluated for possible detox. Patient is agreeable.  Essential hypertension Uncontrolled 2/2 noncompliance. Recommend ER evaluation for blood pressure and detox.  . Reviewed expectations re: course of current medical issues. . Discussed self-management of symptoms. . Outlined signs and symptoms indicating need for more acute intervention. . Patient verbalized understanding and all questions were answered. . See orders for this visit as documented in the electronic medical record. . Patient received an After Visit Summary.  CMA or LPN served as scribe during this visit. History, Physical, and Plan performed by medical provider. The above documentation has been reviewed and is accurate and complete.  I spent 25 minutes with this patient, greater than 50% was face-to-face time counseling regarding the above  diagnoses.  Inda Coke, PA-C [place], [place] [facility] 04/24/2019  Follow-up: No follow-ups on file.

## 2019-04-24 ENCOUNTER — Encounter: Payer: Self-pay | Admitting: Physician Assistant

## 2019-04-24 ENCOUNTER — Ambulatory Visit (INDEPENDENT_AMBULATORY_CARE_PROVIDER_SITE_OTHER): Payer: BC Managed Care – PPO | Admitting: Physician Assistant

## 2019-04-24 ENCOUNTER — Other Ambulatory Visit: Payer: Self-pay

## 2019-04-24 VITALS — BP 150/100 | HR 105 | Temp 97.0°F | Ht 70.0 in | Wt 144.2 lb

## 2019-04-24 DIAGNOSIS — I10 Essential (primary) hypertension: Secondary | ICD-10-CM | POA: Diagnosis not present

## 2019-04-24 DIAGNOSIS — F1020 Alcohol dependence, uncomplicated: Secondary | ICD-10-CM | POA: Diagnosis not present

## 2019-04-24 NOTE — Patient Instructions (Signed)
It was great to see you!  Please go to [facility] for further evaluation and treatment. This is important!  [address], [place] [address] Phone: [phone]   Take care,  Inda Coke PA-C

## 2019-04-28 ENCOUNTER — Other Ambulatory Visit: Payer: Self-pay | Admitting: Physician Assistant

## 2019-06-30 ENCOUNTER — Other Ambulatory Visit: Payer: Self-pay

## 2019-06-30 ENCOUNTER — Encounter (HOSPITAL_COMMUNITY): Payer: Self-pay | Admitting: Emergency Medicine

## 2019-06-30 ENCOUNTER — Inpatient Hospital Stay (HOSPITAL_COMMUNITY)
Admission: EM | Admit: 2019-06-30 | Discharge: 2019-07-07 | DRG: 041 | Disposition: A | Discharge status: Rehabilitation Facility | Payer: BC Managed Care – PPO | Attending: Internal Medicine | Admitting: Internal Medicine

## 2019-06-30 ENCOUNTER — Emergency Department (HOSPITAL_COMMUNITY): Payer: BC Managed Care – PPO

## 2019-06-30 DIAGNOSIS — R471 Dysarthria and anarthria: Secondary | ICD-10-CM | POA: Diagnosis present

## 2019-06-30 DIAGNOSIS — I639 Cerebral infarction, unspecified: Secondary | ICD-10-CM | POA: Diagnosis not present

## 2019-06-30 DIAGNOSIS — R1310 Dysphagia, unspecified: Secondary | ICD-10-CM | POA: Diagnosis present

## 2019-06-30 DIAGNOSIS — Z9119 Patient's noncompliance with other medical treatment and regimen: Secondary | ICD-10-CM

## 2019-06-30 DIAGNOSIS — I69354 Hemiplegia and hemiparesis following cerebral infarction affecting left non-dominant side: Secondary | ICD-10-CM | POA: Diagnosis present

## 2019-06-30 DIAGNOSIS — N179 Acute kidney failure, unspecified: Secondary | ICD-10-CM | POA: Diagnosis not present

## 2019-06-30 DIAGNOSIS — I10 Essential (primary) hypertension: Secondary | ICD-10-CM | POA: Diagnosis not present

## 2019-06-30 DIAGNOSIS — G8194 Hemiplegia, unspecified affecting left nondominant side: Secondary | ICD-10-CM | POA: Diagnosis present

## 2019-06-30 DIAGNOSIS — I6389 Other cerebral infarction: Secondary | ICD-10-CM | POA: Diagnosis not present

## 2019-06-30 DIAGNOSIS — E8809 Other disorders of plasma-protein metabolism, not elsewhere classified: Secondary | ICD-10-CM | POA: Diagnosis not present

## 2019-06-30 DIAGNOSIS — Z7902 Long term (current) use of antithrombotics/antiplatelets: Secondary | ICD-10-CM | POA: Diagnosis not present

## 2019-06-30 DIAGNOSIS — E7800 Pure hypercholesterolemia, unspecified: Secondary | ICD-10-CM | POA: Diagnosis present

## 2019-06-30 DIAGNOSIS — A419 Sepsis, unspecified organism: Secondary | ICD-10-CM | POA: Diagnosis not present

## 2019-06-30 DIAGNOSIS — D72819 Decreased white blood cell count, unspecified: Secondary | ICD-10-CM | POA: Diagnosis not present

## 2019-06-30 DIAGNOSIS — F1010 Alcohol abuse, uncomplicated: Secondary | ICD-10-CM

## 2019-06-30 DIAGNOSIS — Z803 Family history of malignant neoplasm of breast: Secondary | ICD-10-CM | POA: Diagnosis not present

## 2019-06-30 DIAGNOSIS — Z880 Allergy status to penicillin: Secondary | ICD-10-CM | POA: Diagnosis not present

## 2019-06-30 DIAGNOSIS — I160 Hypertensive urgency: Secondary | ICD-10-CM | POA: Diagnosis present

## 2019-06-30 DIAGNOSIS — I700 Atherosclerosis of aorta: Secondary | ICD-10-CM | POA: Diagnosis present

## 2019-06-30 DIAGNOSIS — Z720 Tobacco use: Secondary | ICD-10-CM | POA: Diagnosis not present

## 2019-06-30 DIAGNOSIS — R29709 NIHSS score 9: Secondary | ICD-10-CM | POA: Diagnosis present

## 2019-06-30 DIAGNOSIS — Z8711 Personal history of peptic ulcer disease: Secondary | ICD-10-CM | POA: Diagnosis not present

## 2019-06-30 DIAGNOSIS — D708 Other neutropenia: Secondary | ICD-10-CM | POA: Diagnosis not present

## 2019-06-30 DIAGNOSIS — I69392 Facial weakness following cerebral infarction: Secondary | ICD-10-CM | POA: Diagnosis not present

## 2019-06-30 DIAGNOSIS — D751 Secondary polycythemia: Secondary | ICD-10-CM | POA: Diagnosis not present

## 2019-06-30 DIAGNOSIS — E785 Hyperlipidemia, unspecified: Secondary | ICD-10-CM

## 2019-06-30 DIAGNOSIS — Z7982 Long term (current) use of aspirin: Secondary | ICD-10-CM | POA: Diagnosis not present

## 2019-06-30 DIAGNOSIS — R1312 Dysphagia, oropharyngeal phase: Secondary | ICD-10-CM | POA: Diagnosis present

## 2019-06-30 DIAGNOSIS — K5900 Constipation, unspecified: Secondary | ICD-10-CM | POA: Diagnosis present

## 2019-06-30 DIAGNOSIS — Z800 Family history of malignant neoplasm of digestive organs: Secondary | ICD-10-CM | POA: Diagnosis not present

## 2019-06-30 DIAGNOSIS — Z20822 Contact with and (suspected) exposure to covid-19: Secondary | ICD-10-CM | POA: Diagnosis present

## 2019-06-30 DIAGNOSIS — K219 Gastro-esophageal reflux disease without esophagitis: Secondary | ICD-10-CM | POA: Diagnosis present

## 2019-06-30 DIAGNOSIS — I69391 Dysphagia following cerebral infarction: Secondary | ICD-10-CM

## 2019-06-30 DIAGNOSIS — R0989 Other specified symptoms and signs involving the circulatory and respiratory systems: Secondary | ICD-10-CM | POA: Diagnosis not present

## 2019-06-30 DIAGNOSIS — R5081 Fever presenting with conditions classified elsewhere: Secondary | ICD-10-CM | POA: Diagnosis not present

## 2019-06-30 DIAGNOSIS — R799 Abnormal finding of blood chemistry, unspecified: Secondary | ICD-10-CM | POA: Diagnosis not present

## 2019-06-30 DIAGNOSIS — I6340 Cerebral infarction due to embolism of unspecified cerebral artery: Secondary | ICD-10-CM | POA: Diagnosis present

## 2019-06-30 DIAGNOSIS — R509 Fever, unspecified: Secondary | ICD-10-CM | POA: Diagnosis not present

## 2019-06-30 DIAGNOSIS — R29810 Facial weakness: Secondary | ICD-10-CM | POA: Diagnosis present

## 2019-06-30 DIAGNOSIS — R7303 Prediabetes: Secondary | ICD-10-CM | POA: Diagnosis present

## 2019-06-30 DIAGNOSIS — F17210 Nicotine dependence, cigarettes, uncomplicated: Secondary | ICD-10-CM | POA: Diagnosis present

## 2019-06-30 DIAGNOSIS — Z79899 Other long term (current) drug therapy: Secondary | ICD-10-CM | POA: Diagnosis not present

## 2019-06-30 DIAGNOSIS — R030 Elevated blood-pressure reading, without diagnosis of hypertension: Secondary | ICD-10-CM | POA: Diagnosis not present

## 2019-06-30 DIAGNOSIS — R32 Unspecified urinary incontinence: Secondary | ICD-10-CM | POA: Diagnosis present

## 2019-06-30 LAB — COMPREHENSIVE METABOLIC PANEL
ALT: 29 U/L (ref 0–44)
AST: 53 U/L — ABNORMAL HIGH (ref 15–41)
Albumin: 3.3 g/dL — ABNORMAL LOW (ref 3.5–5.0)
Alkaline Phosphatase: 100 U/L (ref 38–126)
Anion gap: 12 (ref 5–15)
BUN: 7 mg/dL — ABNORMAL LOW (ref 8–23)
CO2: 23 mmol/L (ref 22–32)
Calcium: 9.4 mg/dL (ref 8.9–10.3)
Chloride: 101 mmol/L (ref 98–111)
Creatinine, Ser: 1.16 mg/dL (ref 0.61–1.24)
GFR calc Af Amer: >60 mL/min (ref >60)
GFR calc non Af Amer: >60 mL/min (ref >60)
Glucose, Bld: 134 mg/dL — ABNORMAL HIGH (ref 70–99)
Potassium: 4 mmol/L (ref 3.5–5.1)
Sodium: 136 mmol/L (ref 135–145)
Total Bilirubin: 1.4 mg/dL — ABNORMAL HIGH (ref 0.3–1.2)
Total Protein: 7.3 g/dL (ref 6.5–8.1)

## 2019-06-30 LAB — DIFFERENTIAL
Abs Immature Granulocytes: 0 10*3/uL (ref 0.00–0.07)
Basophils Absolute: 0 10*3/uL (ref 0.0–0.1)
Basophils Relative: 1 %
Eosinophils Absolute: 0 10*3/uL (ref 0.0–0.5)
Eosinophils Relative: 0 %
Immature Granulocytes: 0 %
Lymphocytes Relative: 16 %
Lymphs Abs: 0.7 10*3/uL (ref 0.7–4.0)
Monocytes Absolute: 0.5 10*3/uL (ref 0.1–1.0)
Monocytes Relative: 10 %
Neutro Abs: 3.4 10*3/uL (ref 1.7–7.7)
Neutrophils Relative %: 73 %

## 2019-06-30 LAB — I-STAT CHEM 8, ED
BUN: 8 mg/dL (ref 8–23)
Calcium, Ion: 1.02 mmol/L — ABNORMAL LOW (ref 1.15–1.40)
Chloride: 101 mmol/L (ref 98–111)
Creatinine, Ser: 0.9 mg/dL (ref 0.61–1.24)
Glucose, Bld: 132 mg/dL — ABNORMAL HIGH (ref 70–99)
HCT: 59 % — ABNORMAL HIGH (ref 39.0–52.0)
Hemoglobin: 20.1 g/dL — ABNORMAL HIGH (ref 13.0–17.0)
Potassium: 3.8 mmol/L (ref 3.5–5.1)
Sodium: 137 mmol/L (ref 135–145)
TCO2: 26 mmol/L (ref 22–32)

## 2019-06-30 LAB — CBC
HCT: 54.7 % — ABNORMAL HIGH (ref 39.0–52.0)
Hemoglobin: 19.2 g/dL — ABNORMAL HIGH (ref 13.0–17.0)
MCH: 32.2 pg (ref 26.0–34.0)
MCHC: 35.1 g/dL (ref 30.0–36.0)
MCV: 91.8 fL (ref 80.0–100.0)
Platelets: 190 10*3/uL (ref 150–400)
RBC: 5.96 MIL/uL — ABNORMAL HIGH (ref 4.22–5.81)
RDW: 16 % — ABNORMAL HIGH (ref 11.5–15.5)
WBC: 4.6 10*3/uL (ref 4.0–10.5)
nRBC: 0 % (ref 0.0–0.2)

## 2019-06-30 LAB — CBG MONITORING, ED: Glucose-Capillary: 106 mg/dL — ABNORMAL HIGH (ref 70–99)

## 2019-06-30 LAB — PROTIME-INR
INR: 1 (ref 0.8–1.2)
Prothrombin Time: 12.6 seconds (ref 11.4–15.2)

## 2019-06-30 LAB — ETHANOL: Alcohol, Ethyl (B): 18 mg/dL — ABNORMAL HIGH (ref <10)

## 2019-06-30 LAB — APTT: aPTT: 30 seconds (ref 24–36)

## 2019-06-30 IMAGING — CT CT HEAD CODE STROKE
4 series · 16 of 47 positions shown, 18 images · non-contrast
Comparison: None.

CLINICAL DATA: Code stroke.  Left-sided weakness

EXAM:
CT HEAD WITHOUT CONTRAST
TECHNIQUE: Contiguous axial images were obtained from the base of the skull
through the vertex without intravenous contrast.

[Series 2: head wo · axial · 0.46mm/px · z∈[+128,+263]mm · 7 of 37 slices shown, 9 images]
[im 5/37  brain]
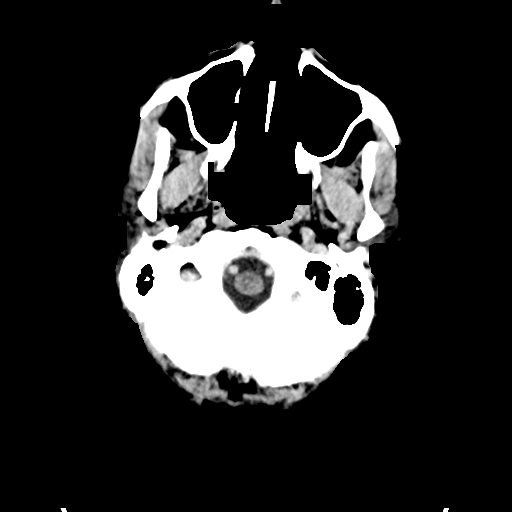
[im 5/37  bone]
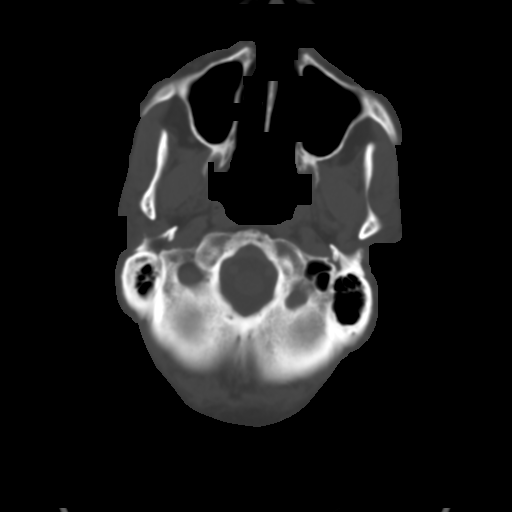
[im 10/37  brain]
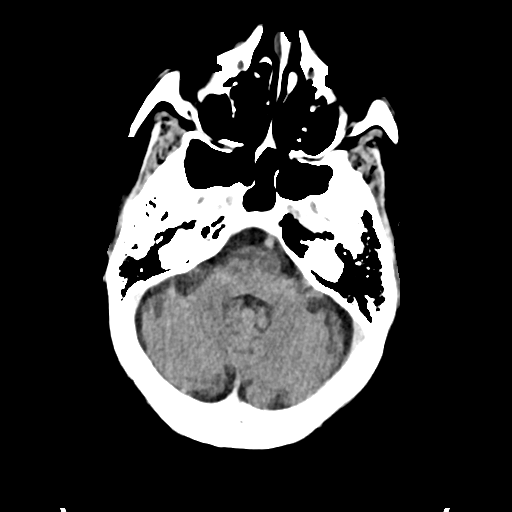
[im 14/37  brain]
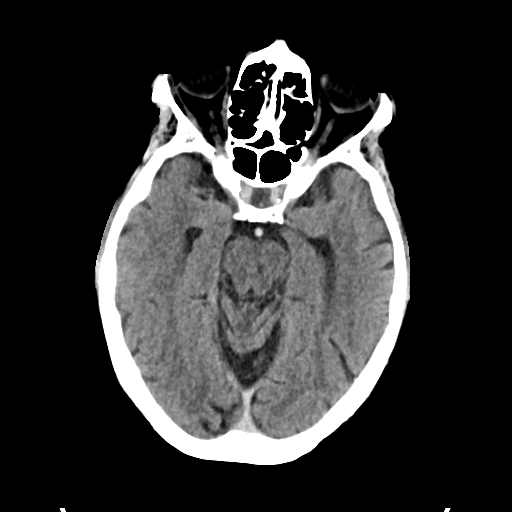
[im 19/37  brain]
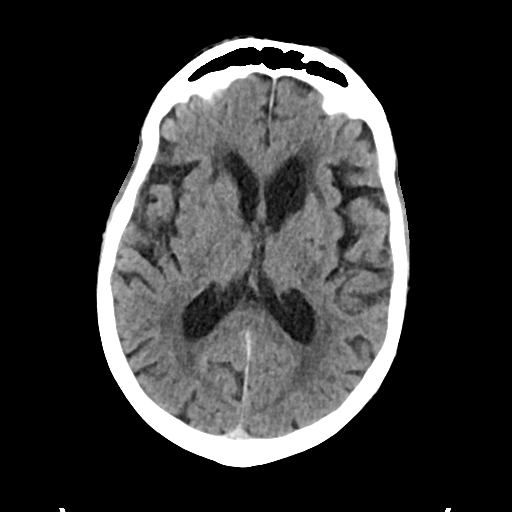
[im 23/37  brain]
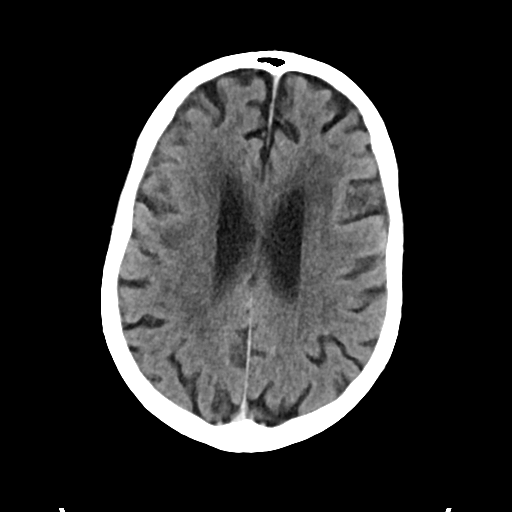
[im 23/37  bone]
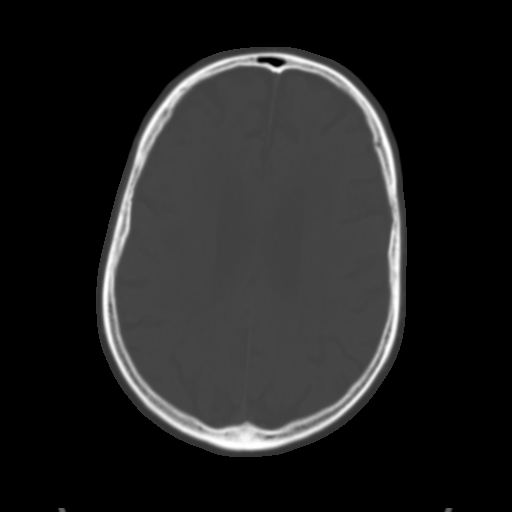
[im 28/37  brain]
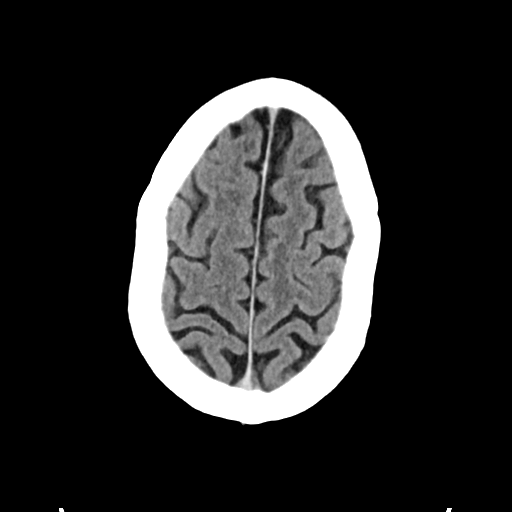
[im 32/37  brain]
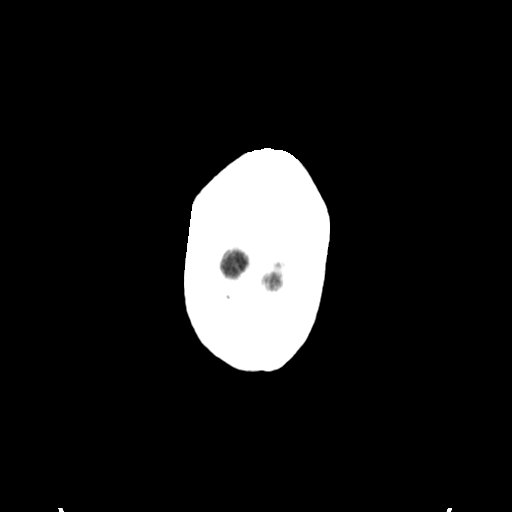

[Series 3: head bone · axial · 0.46mm/px · z∈[+126,+162]mm · 3 of 91 slices shown]
[im 10/91  bone]
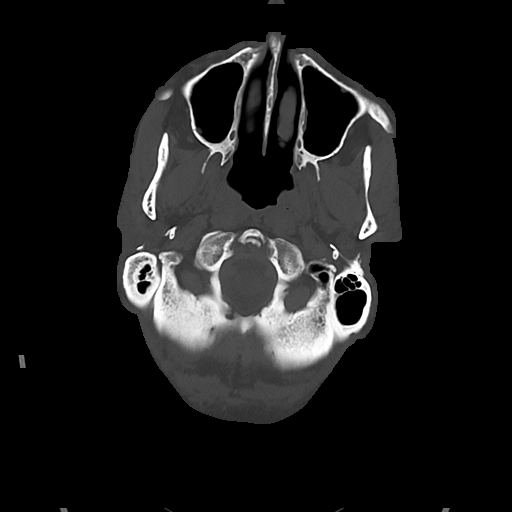
[im 19/91  bone]
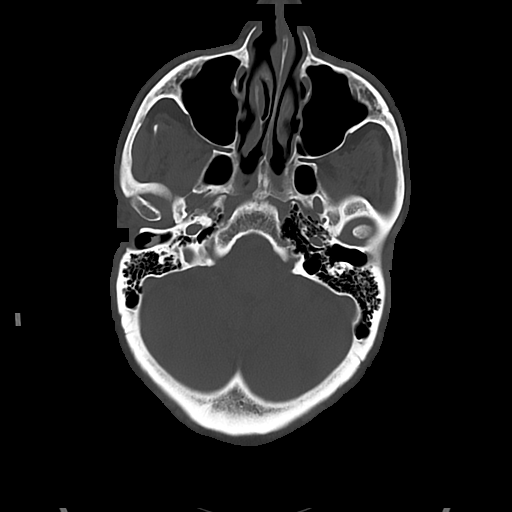
[im 28/91  bone]
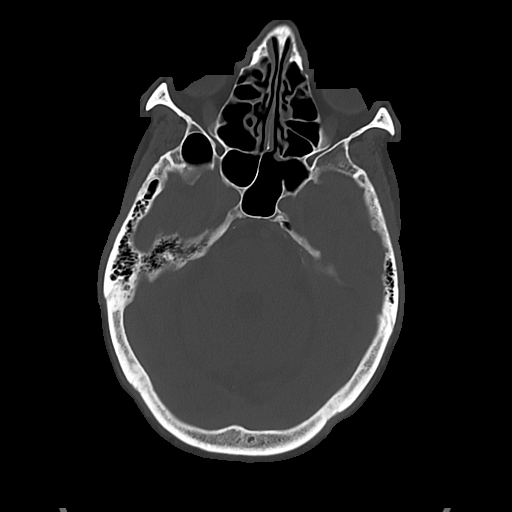

[Series 4: cor soft · coronal · 0.34mm/px · 3 of 80 slices shown]
[im 27/80  brain]
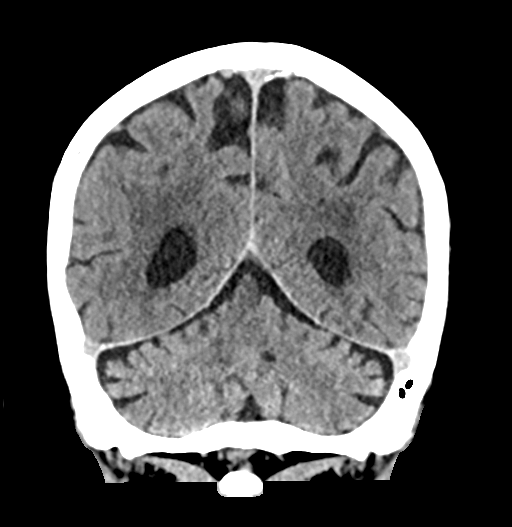
[im 36/80  brain]
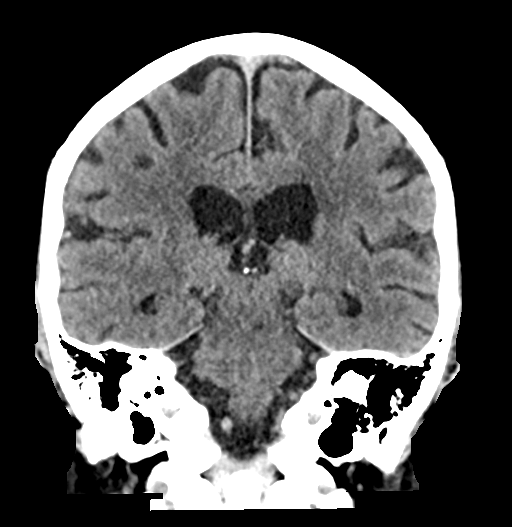
[im 44/80  brain]
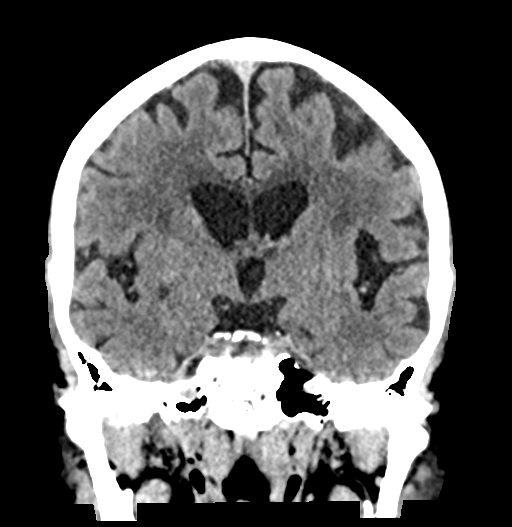

[Series 5: sag soft · sagittal · 0.35mm/px · 3 of 59 slices shown]
[im 20/59  brain]
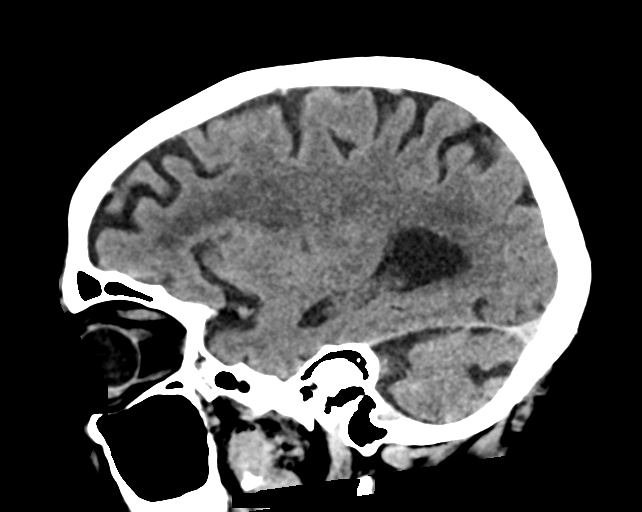
[im 30/59  brain]
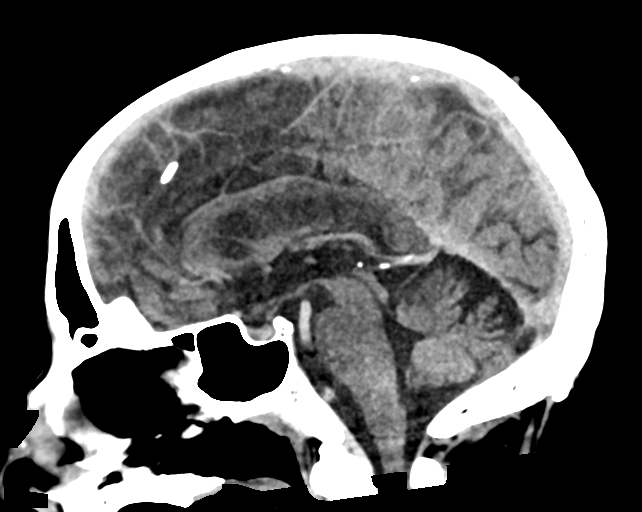
[im 39/59  brain]
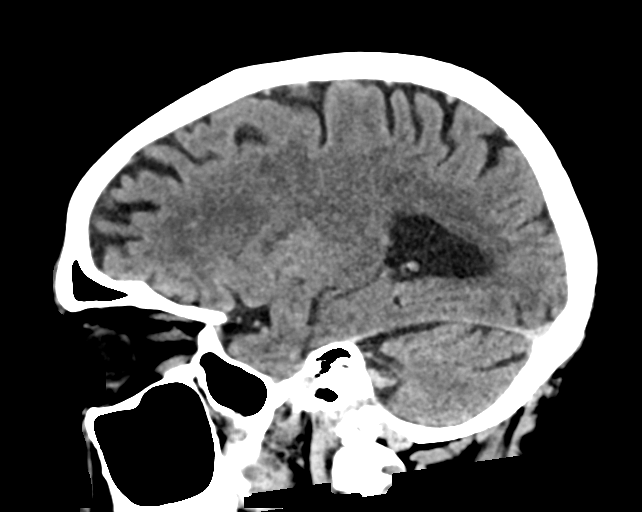

[16 of 47 positions shown; findings below may reference images not displayed]

FINDINGS: Brain: There is no mass, hemorrhage or extra-axial collection. There
is generalized atrophy without lobar predilection. There is
hypoattenuation of the periventricular white matter, most commonly
indicating chronic ischemic microangiopathy. Old left basal ganglia
small vessel infarct.

Vascular: No abnormal hyperdensity of the major intracranial
arteries or dural venous sinuses. No intracranial atherosclerosis.

Skull: The visualized skull base, calvarium and extracranial soft
tissues are normal.

Sinuses/Orbits: No fluid levels or advanced mucosal thickening of
the visualized paranasal sinuses. No mastoid or middle ear effusion.
The orbits are normal.

ASPECTS (Alberta Stroke Program Early CT Score)

- Ganglionic level infarction (caudate, lentiform nuclei, internal
capsule, insula, M1-M3 cortex): 7

- Supraganglionic infarction (M4-M6 cortex): 3

Total score (0-10 with 10 being normal): 10
IMPRESSION: 1. Chronic ischemic microangiopathy and generalized atrophy without
acute intracranial abnormality.
2. ASPECTS is 10.
* These results were communicated to Dr. NIELS at [DATE] on
[DATE] by text page via the [employer] messaging system.

## 2019-06-30 MED ORDER — LORAZEPAM 2 MG/ML IJ SOLN
0.0000 mg | Freq: Four times a day (QID) | INTRAMUSCULAR | Status: AC
  Administered 2019-07-01: 1 mg via INTRAVENOUS
  Filled 2019-06-30: qty 1

## 2019-06-30 MED ORDER — LORAZEPAM 2 MG/ML IJ SOLN
1.0000 mg | INTRAMUSCULAR | Status: AC | PRN
  Administered 2019-07-01: 1 mg via INTRAVENOUS
  Filled 2019-06-30: qty 1

## 2019-06-30 MED ORDER — ACETAMINOPHEN 650 MG RE SUPP: 650.0000 mg | RECTAL | Status: DC | PRN

## 2019-06-30 MED ORDER — ACETAMINOPHEN 160 MG/5ML PO SOLN: 650.0000 mg | ORAL | Status: DC | PRN

## 2019-06-30 MED ORDER — THIAMINE HCL 100 MG PO TABS
100.0000 mg | ORAL_TABLET | Freq: Every day | ORAL | Status: DC
  Administered 2019-07-02 – 2019-07-07 (×6): 100 mg via ORAL
  Filled 2019-06-30 (×7): qty 1

## 2019-06-30 MED ORDER — ENOXAPARIN SODIUM 40 MG/0.4ML ~~LOC~~ SOLN
40.0000 mg | SUBCUTANEOUS | Status: DC
  Administered 2019-07-01 – 2019-07-06 (×7): 40 mg via SUBCUTANEOUS
  Filled 2019-06-30 (×7): qty 0.4

## 2019-06-30 MED ORDER — ACETAMINOPHEN 325 MG PO TABS: 650.0000 mg | ORAL_TABLET | ORAL | Status: DC | PRN

## 2019-06-30 MED ORDER — ASPIRIN 300 MG RE SUPP: 300.0000 mg | Freq: Every day | RECTAL | Status: DC

## 2019-06-30 MED ORDER — ASPIRIN 300 MG RE SUPP: 150.0000 mg | Freq: Every day | RECTAL | Status: DC

## 2019-06-30 MED ORDER — LABETALOL HCL 5 MG/ML IV SOLN
10.0000 mg | INTRAVENOUS | Status: DC | PRN
  Administered 2019-07-01 – 2019-07-02 (×7): 10 mg via INTRAVENOUS
  Filled 2019-06-30 (×7): qty 4

## 2019-06-30 MED ORDER — ADULT MULTIVITAMIN W/MINERALS CH
1.0000 | ORAL_TABLET | Freq: Every day | ORAL | Status: DC
  Administered 2019-07-01 – 2019-07-07 (×7): 1 via ORAL
  Filled 2019-06-30 (×8): qty 1

## 2019-06-30 MED ORDER — THIAMINE HCL 100 MG/ML IJ SOLN
100.0000 mg | Freq: Every day | INTRAMUSCULAR | Status: DC
  Administered 2019-07-01: 100 mg via INTRAVENOUS
  Filled 2019-06-30 (×2): qty 2

## 2019-06-30 MED ORDER — LORAZEPAM 1 MG PO TABS: 2.0000 mg | ORAL_TABLET | Freq: Four times a day (QID) | ORAL | Status: DC

## 2019-06-30 MED ORDER — LORAZEPAM 1 MG PO TABS: 1.0000 mg | ORAL_TABLET | Freq: Four times a day (QID) | ORAL | Status: DC

## 2019-06-30 MED ORDER — LORAZEPAM 2 MG/ML IJ SOLN: 0.0000 mg | Freq: Two times a day (BID) | INTRAMUSCULAR | Status: AC

## 2019-06-30 MED ORDER — SODIUM CHLORIDE 0.9 % IV SOLN: 1.0000 mg | Freq: Every day | INTRAVENOUS | Status: DC

## 2019-06-30 MED ORDER — SENNOSIDES-DOCUSATE SODIUM 8.6-50 MG PO TABS: 1.0000 | ORAL_TABLET | Freq: Every evening | ORAL | Status: DC | PRN

## 2019-06-30 MED ORDER — STROKE: EARLY STAGES OF RECOVERY BOOK
Freq: Once | Status: AC
  Filled 2019-06-30: qty 1

## 2019-06-30 MED ORDER — SODIUM CHLORIDE 0.9 % IV SOLN: INTRAVENOUS | Status: DC

## 2019-06-30 MED ORDER — LORAZEPAM 1 MG PO TABS: 1.0000 mg | ORAL_TABLET | ORAL | Status: AC | PRN

## 2019-06-30 MED ORDER — IOHEXOL 350 MG/ML SOLN
100.0000 mL | Freq: Once | INTRAVENOUS | Status: AC | PRN
  Administered 2019-06-30: 100 mL via INTRAVENOUS

## 2019-06-30 MED ORDER — ASPIRIN 325 MG PO TABS
325.0000 mg | ORAL_TABLET | Freq: Every day | ORAL | Status: DC
  Administered 2019-07-01 – 2019-07-07 (×7): 325 mg via ORAL
  Filled 2019-06-30 (×7): qty 1

## 2019-06-30 NOTE — Hospital Course (Addendum)
Mr. Alltop is a 69 year old AA male with PMH HTN, alcohol abuse (ongoing use), HLD, GERD, PUD, tobacco use who presented to the ER after being found down at home in the bathroom.  History is provided by the patient who is able to provide collateral information and full HPI as needed.  His wife has already left the ER and went home. He states that she was at work around 1030 this morning when he was attempting to go to the bathroom and collapsed to the floor.  He did not pass out and does recall all the events of falling.  He also denies hitting his head.  He laid on the ground due to weakness until his wife came home around 5:30 PM.  He notes being extremely weak in his left upper and lower extremity.  His speech has also been slurred since the episode occurred and he has a very noticeable left facial droop. He was brought to the ER via EMS and underwent work-up for code stroke. Workup was notable for acute stroke in the right frontal lobe and left cerebellum.  He underwent neuro eval in the ER as well and is admitted for further stroke workup and monitoring.   On exam, he has significant weakness involving his left upper and lower extremity.  He also has significant dysarthria but no true aphasia.  Left-sided facial droop is also obvious.  He had no significant drooling at time of my interview. He does endorse drinking approximately 3-4 beers daily and approximately half a pint of gin daily.  His last drink was Monday evening.  He also continues to smoke approximately 0.5 PPD x12 years.

## 2019-06-30 NOTE — ED Notes (Signed)
Condom cath placed on patient. Patient incont of urine

## 2019-06-30 NOTE — ED Provider Notes (Signed)
[facility]GENC[facility] DEPARTMENT Provider Note   CSN: [id number] Arrival date & time: 06/30/19  [id number]  An emergency department physician performed an initial assessment on this suspected stroke patient at 69.  History Chief Complaint  Patient presents with  . Code Stroke    Russell Russell is a 69 y.o. male.  HPI Patient's wife reports that she left the patient at 5 AM.  He seemed normal at that time.  They usually talk just before her lunch break which is at 1015.  He was normal at that time.  Patient reports he recalls at just about 1030 when he was hanging up the phone, all of a sudden dropping the phone and collapsing to the floor.  He reports that he could not move to get up.  He also could not get the phone to call his wife back or call for help.  He is very aware of the event.  His wife returned home at 5 PM.  She found him on the floor and tried to help him up.  She reports he couldn't do anything to move his left arm or leg to help get off the floor.  She called EMS.  Patient arrived as code stroke.  Patient denies he has a headache.  He denies any visual changes.  He reports he can tell that his speech is slurred and that he is significantly weak in the left arm and leg.  Patient reports he does drink alcohol daily.  He reports a long history of alcohol consumption.  He denies that he experiences tremors or symptoms of withdrawal if he doesn't drink for a day or so.  No history of seizure or delirium tremens per the patient.  Patient is wife reports he has an "smoker's cough".  Patient denies fever chills or any recent illness.    Past Medical History:  Diagnosis Date  . Alcohol abuse    drinks 1 gallon of Gin every weekend, nothing during the week x >15 yrs  . Elevated transaminase level    +elev bili: abd u/s 06/2014 showed stable small hepatic hemangiomas, o/w normal.  . Essential hypertension   . GERD (gastroesophageal reflux disease)   . Hyperlipidemia  03/2014   Atorv started-chol improved  . Impaired fasting glucose 03/2014  . Nephrolithiasis   . PUD (peptic ulcer disease)   . Tobacco dependence    chantix: "psych effects"    Patient Active Problem List   Diagnosis Date Noted  . Prediabetes 12/23/2016  . Elevated LFTs 12/23/2016  . Pure hypercholesterolemia 12/21/2016  . Alcoholism ([place]) 12/21/2016  . Eczema 09/18/2013  . Essential hypertension 09/18/2013  . Gastroesophageal reflux disease without esophagitis 09/18/2013  . Tobacco abuse 09/18/2013    Past Surgical History:  Procedure Laterality Date  . COLONOSCOPY  2005   Recall 10 yrs ([place])  . removal of kidney stones  2006   cystoscopic--removed from ureter.  No prob since.       Family History  Problem Relation Age of Onset  . Colon cancer Mother   . Cancer Father   . Breast cancer Sister   . Breast cancer Sister     Social History   Tobacco Use  . Smoking status: Current Every Day Smoker    Packs/day: 0.50    Years: 20.00    Pack years: 10.00    Types: Cigarettes  . Smokeless tobacco: Never Used  Substance Use Topics  . Alcohol use: Yes    Alcohol/week:  8.0 standard drinks    Types: 8 Cans of beer per week    Comment: Weekends only  . Drug use: No    Home Medications Prior to Admission medications   Medication Sig Start Date End Date Taking? Authorizing Provider  atorvastatin (LIPITOR) 40 MG tablet Take 1 tablet (40 mg total) by mouth daily. 02/25/19  Yes Inda Coke, PA  amLODipine (NORVASC) 10 MG tablet Take 1 tablet (10 mg total) by mouth daily. Patient not taking: Reported on 06/30/2019 03/24/19   Inda Coke, PA  halobetasol (ULTRAVATE) 0.05 % cream Apply topically 2 (two) times daily. Patient not taking: Reported on 06/30/2019 12/18/17   Briscoe Deutscher, DO    Allergies    Penicillins  Review of Systems   Review of Systems 10 Systems reviewed and are negative for acute change except as noted in the HPI. Physical  Exam Updated Vital Signs Wt 68.5 kg   BMI 21.67 kg/m   Physical Exam Constitutional:      Comments: Patient is awake and alert.  No somnolence.  No respiratory distress.  He is diaphoretic on the forehead.  HENT:     Head: Normocephalic and atraumatic.     Mouth/Throat:     Comments: Patient has significant right facial droop.  He is doing some drooling and cannot keep all of his secretions in his mouth.  Airway is patent. Eyes:     Extraocular Movements: Extraocular movements intact.     Pupils: Pupils are equal, round, and reactive to light.  Cardiovascular:     Rate and Rhythm: Normal rate and regular rhythm.     Pulses: Normal pulses.  Pulmonary:     Comments: No respiratory distress.  Occasional cough.  Lungs grossly clear with diminished breath sounds at the bases and occasional expiratory wheeze. Abdominal:     General: There is no distension.     Palpations: Abdomen is soft.     Tenderness: There is no abdominal tenderness. There is no guarding.  Musculoskeletal:        General: No swelling or tenderness.     Cervical back: Neck supple.  Skin:    General: Skin is warm and dry.  Neurological:     Comments: Patient's speech content is situationally appropriate.  He does not appear to have receptive or expressive aphasia.  Speech is slurred due to facial paralysis.  Significant droop of the mouth on the right side.  Patient has some difficulty keeping secretions in the mouth.  Patient can elevate right upper extremity off the bed and hold it.  Grip strength intact right upper extremity.  Left extremity patient can only do weak grip strength but cannot spontaneously elevate the extremity.  Patient can elevate right lower extremity off the bed but cannot get left lower extremity elevated.  Psychiatric:        Mood and Affect: Mood normal.     ED Results / Procedures / Treatments   Labs (all labs ordered are listed, but only abnormal results are displayed) Labs Reviewed   ETHANOL - Abnormal; Notable for the following components:      Result Value   Alcohol, Ethyl (B) 18 (*)    All other components within normal limits  CBC - Abnormal; Notable for the following components:   RBC 5.96 (*)    Hemoglobin 19.2 (*)    HCT 54.7 (*)    RDW 16.0 (*)    All other components within normal limits  COMPREHENSIVE METABOLIC PANEL - Abnormal;  Notable for the following components:   Glucose, Bld 134 (*)    BUN 7 (*)    Albumin 3.3 (*)    AST 53 (*)    Total Bilirubin 1.4 (*)    All other components within normal limits  I-STAT CHEM 8, ED - Abnormal; Notable for the following components:   Glucose, Bld 132 (*)    Calcium, Ion 1.02 (*)    Hemoglobin 20.1 (*)    HCT 59.0 (*)    All other components within normal limits  CBG MONITORING, ED - Abnormal; Notable for the following components:   Glucose-Capillary 106 (*)    All other components within normal limits  DIFFERENTIAL  PROTIME-INR  APTT  RAPID URINE DRUG SCREEN, HOSP PERFORMED  URINALYSIS, ROUTINE W REFLEX MICROSCOPIC    EKG EKG Interpretation  Date/Time:  Tuesday June 30 2019 20:16:51 EDT Ventricular Rate:  100 PR Interval:    QRS Duration: 77 QT Interval:  358 QTC Calculation: 462 R Axis:   -67 Text Interpretation: Sinus tachycardia Left anterior fascicular block no sig change from previous Confirmed by Charlesetta Shanks [phone]) on 06/30/2019 9:28:04 PM   Radiology CT Code Stroke CTA Head W/WO contrast  Result Date: 06/30/2019 CLINICAL DATA:  Left-sided weakness EXAM: CT ANGIOGRAPHY HEAD AND NECK CT PERFUSION BRAIN TECHNIQUE: Multidetector CT imaging of the head and neck was performed using the standard protocol during bolus administration of intravenous contrast. Multiplanar CT image reconstructions and MIPs were obtained to evaluate the vascular anatomy. Carotid stenosis measurements (when applicable) are obtained utilizing NASCET criteria, using the distal internal carotid diameter as the  denominator. Multiphase CT imaging of the brain was performed following IV bolus contrast injection. Subsequent parametric perfusion maps were calculated using RAPID software. CONTRAST:  138mL OMNIPAQUE IOHEXOL 350 MG/ML SOLN COMPARISON:  None. FINDINGS: CTA NECK FINDINGS SKELETON: There is no bony spinal canal stenosis. No lytic or blastic lesion. OTHER NECK: Normal pharynx, larynx and major salivary glands. No cervical lymphadenopathy. Unremarkable thyroid gland. UPPER CHEST: No pneumothorax or pleural effusion. No nodules or masses. AORTIC ARCH: There is mild calcific atherosclerosis of the aortic arch. 3.6 cm diameter of descending thoracic aorta arch. Conventional 3 vessel aortic branching pattern. The visualized proximal subclavian arteries are widely patent. RIGHT CAROTID SYSTEM: Normal without aneurysm, dissection or stenosis. LEFT CAROTID SYSTEM: Normal without aneurysm, dissection or stenosis. VERTEBRAL ARTERIES: Right dominant configuration. Both origins are clearly patent. There is no dissection, occlusion or flow-limiting stenosis to the skull base (V1-V3 segments). CTA HEAD FINDINGS POSTERIOR CIRCULATION: --Vertebral arteries: Normal V4 segments. --Posterior inferior cerebellar arteries (PICA): Patent origins from the vertebral arteries. --Anterior inferior cerebellar arteries (AICA): Patent origins from the basilar artery. --Basilar artery: Normal. --Superior cerebellar arteries: Normal. --Posterior cerebral arteries: Normal. Both originate from the basilar artery. Posterior communicating arteries (p-comm) are diminutive or absent. ANTERIOR CIRCULATION: --Intracranial internal carotid arteries: Normal. --Anterior cerebral arteries (ACA): Normal. Both A1 segments are present. Patent anterior communicating artery (a-comm). --Middle cerebral arteries (MCA): Normal. VENOUS SINUSES: As permitted by contrast timing, patent. ANATOMIC VARIANTS: None Review of the MIP images confirms the above findings. CT  Brain Perfusion Findings: ASPECTS: 10 CBF (<30%) Volume: 99mL Perfusion (Tmax>6.0s) volume: 40mL Mismatch Volume: 27mL Infarction Location:Areas of elevated Tmax are located in the right frontal periventricular white matter and left cerebellum. These regions correspond to areas of hypoattenuation on the noncontrast head CT, including an old left cerebellar infarct. IMPRESSION: 1. No emergent large vessel occlusion or high-grade stenosis of the intracranial arteries.  2. Area of ischemia identified in the right frontal periventricular white matter and left cerebellum, corresponding to the areas of hypoattenuation on the noncontrast head CT and possibly artifact/pseudonormalization. No core infarct by CT perfusion analysis. 3. Aortic Atherosclerosis (ICD10-I70.0). 4. 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 Recommend annual imaging followup by CTA or MRA. This recommendation follows 2010 ACCF/AHA/AATS/ACR/ASA/SCA/SCAI/SIR/STS/SVM Guidelines for the Diagnosis and Management of Patients with Thoracic Aortic Disease. Circulation.2010; [id number]. Aortic aneurysm NOS (ICD10-I71.9) Electronically Signed   By: Ulyses Jarred M.D.   On: 06/30/2019 20:03   CT Code Stroke CTA Neck W/WO contrast  Result Date: 06/30/2019 CLINICAL DATA:  Left-sided weakness EXAM: CT ANGIOGRAPHY HEAD AND NECK CT PERFUSION BRAIN TECHNIQUE: Multidetector CT imaging of the head and neck was performed using the standard protocol during bolus administration of intravenous contrast. Multiplanar CT image reconstructions and MIPs were obtained to evaluate the vascular anatomy. Carotid stenosis measurements (when applicable) are obtained utilizing NASCET criteria, using the distal internal carotid diameter as the denominator. Multiphase CT imaging of the brain was performed following IV bolus contrast injection. Subsequent parametric perfusion maps were calculated using RAPID software. CONTRAST:  141mL OMNIPAQUE IOHEXOL 350 MG/ML SOLN COMPARISON:  None. FINDINGS:  CTA NECK FINDINGS SKELETON: There is no bony spinal canal stenosis. No lytic or blastic lesion. OTHER NECK: Normal pharynx, larynx and major salivary glands. No cervical lymphadenopathy. Unremarkable thyroid gland. UPPER CHEST: No pneumothorax or pleural effusion. No nodules or masses. AORTIC ARCH: There is mild calcific atherosclerosis of the aortic arch. 3.6 cm diameter of descending thoracic aorta arch. Conventional 3 vessel aortic branching pattern. The visualized proximal subclavian arteries are widely patent. RIGHT CAROTID SYSTEM: Normal without aneurysm, dissection or stenosis. LEFT CAROTID SYSTEM: Normal without aneurysm, dissection or stenosis. VERTEBRAL ARTERIES: Right dominant configuration. Both origins are clearly patent. There is no dissection, occlusion or flow-limiting stenosis to the skull base (V1-V3 segments). CTA HEAD FINDINGS POSTERIOR CIRCULATION: --Vertebral arteries: Normal V4 segments. --Posterior inferior cerebellar arteries (PICA): Patent origins from the vertebral arteries. --Anterior inferior cerebellar arteries (AICA): Patent origins from the basilar artery. --Basilar artery: Normal. --Superior cerebellar arteries: Normal. --Posterior cerebral arteries: Normal. Both originate from the basilar artery. Posterior communicating arteries (p-comm) are diminutive or absent. ANTERIOR CIRCULATION: --Intracranial internal carotid arteries: Normal. --Anterior cerebral arteries (ACA): Normal. Both A1 segments are present. Patent anterior communicating artery (a-comm). --Middle cerebral arteries (MCA): Normal. VENOUS SINUSES: As permitted by contrast timing, patent. ANATOMIC VARIANTS: None Review of the MIP images confirms the above findings. CT Brain Perfusion Findings: ASPECTS: 10 CBF (<30%) Volume: 57mL Perfusion (Tmax>6.0s) volume: 46mL Mismatch Volume: 40mL Infarction Location:Areas of elevated Tmax are located in the right frontal periventricular white matter and left cerebellum. These regions  correspond to areas of hypoattenuation on the noncontrast head CT, including an old left cerebellar infarct. IMPRESSION: 1. No emergent large vessel occlusion or high-grade stenosis of the intracranial arteries. 2. Area of ischemia identified in the right frontal periventricular white matter and left cerebellum, corresponding to the areas of hypoattenuation on the noncontrast head CT and possibly artifact/pseudonormalization. No core infarct by CT perfusion analysis. 3. Aortic Atherosclerosis (ICD10-I70.0). 4. 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 Recommend annual imaging followup by CTA or MRA. This recommendation follows 2010 ACCF/AHA/AATS/ACR/ASA/SCA/SCAI/SIR/STS/SVM Guidelines for the Diagnosis and Management of Patients with Thoracic Aortic Disease. Circulation.2010; [id number]. Aortic aneurysm NOS (ICD10-I71.9) Electronically Signed   By: Ulyses Jarred M.D.   On: 06/30/2019 20:03   CT Code Stroke Cerebral Perfusion with contrast  Result Date: 06/30/2019  CLINICAL DATA:  Left-sided weakness EXAM: CT ANGIOGRAPHY HEAD AND NECK CT PERFUSION BRAIN TECHNIQUE: Multidetector CT imaging of the head and neck was performed using the standard protocol during bolus administration of intravenous contrast. Multiplanar CT image reconstructions and MIPs were obtained to evaluate the vascular anatomy. Carotid stenosis measurements (when applicable) are obtained utilizing NASCET criteria, using the distal internal carotid diameter as the denominator. Multiphase CT imaging of the brain was performed following IV bolus contrast injection. Subsequent parametric perfusion maps were calculated using RAPID software. CONTRAST:  129mL OMNIPAQUE IOHEXOL 350 MG/ML SOLN COMPARISON:  None. FINDINGS: CTA NECK FINDINGS SKELETON: There is no bony spinal canal stenosis. No lytic or blastic lesion. OTHER NECK: Normal pharynx, larynx and major salivary glands. No cervical lymphadenopathy. Unremarkable thyroid gland. UPPER CHEST: No pneumothorax or  pleural effusion. No nodules or masses. AORTIC ARCH: There is mild calcific atherosclerosis of the aortic arch. 3.6 cm diameter of descending thoracic aorta arch. Conventional 3 vessel aortic branching pattern. The visualized proximal subclavian arteries are widely patent. RIGHT CAROTID SYSTEM: Normal without aneurysm, dissection or stenosis. LEFT CAROTID SYSTEM: Normal without aneurysm, dissection or stenosis. VERTEBRAL ARTERIES: Right dominant configuration. Both origins are clearly patent. There is no dissection, occlusion or flow-limiting stenosis to the skull base (V1-V3 segments). CTA HEAD FINDINGS POSTERIOR CIRCULATION: --Vertebral arteries: Normal V4 segments. --Posterior inferior cerebellar arteries (PICA): Patent origins from the vertebral arteries. --Anterior inferior cerebellar arteries (AICA): Patent origins from the basilar artery. --Basilar artery: Normal. --Superior cerebellar arteries: Normal. --Posterior cerebral arteries: Normal. Both originate from the basilar artery. Posterior communicating arteries (p-comm) are diminutive or absent. ANTERIOR CIRCULATION: --Intracranial internal carotid arteries: Normal. --Anterior cerebral arteries (ACA): Normal. Both A1 segments are present. Patent anterior communicating artery (a-comm). --Middle cerebral arteries (MCA): Normal. VENOUS SINUSES: As permitted by contrast timing, patent. ANATOMIC VARIANTS: None Review of the MIP images confirms the above findings. CT Brain Perfusion Findings: ASPECTS: 10 CBF (<30%) Volume: 57mL Perfusion (Tmax>6.0s) volume: 22mL Mismatch Volume: 36mL Infarction Location:Areas of elevated Tmax are located in the right frontal periventricular white matter and left cerebellum. These regions correspond to areas of hypoattenuation on the noncontrast head CT, including an old left cerebellar infarct. IMPRESSION: 1. No emergent large vessel occlusion or high-grade stenosis of the intracranial arteries. 2. Area of ischemia identified in  the right frontal periventricular white matter and left cerebellum, corresponding to the areas of hypoattenuation on the noncontrast head CT and possibly artifact/pseudonormalization. No core infarct by CT perfusion analysis. 3. Aortic Atherosclerosis (ICD10-I70.0). 4. 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 Recommend annual imaging followup by CTA or MRA. This recommendation follows 2010 ACCF/AHA/AATS/ACR/ASA/SCA/SCAI/SIR/STS/SVM Guidelines for the Diagnosis and Management of Patients with Thoracic Aortic Disease. Circulation.2010; [id number]. Aortic aneurysm NOS (ICD10-I71.9) Electronically Signed   By: Ulyses Jarred M.D.   On: 06/30/2019 20:03   CT HEAD CODE STROKE WO CONTRAST  Result Date: 06/30/2019 CLINICAL DATA:  Code stroke.  Left-sided weakness EXAM: CT HEAD WITHOUT CONTRAST TECHNIQUE: Contiguous axial images were obtained from the base of the skull through the vertex without intravenous contrast. COMPARISON:  None. FINDINGS: Brain: There is no mass, hemorrhage or extra-axial collection. There is generalized atrophy without lobar predilection. There is hypoattenuation of the periventricular white matter, most commonly indicating chronic ischemic microangiopathy. Old left basal ganglia small vessel infarct. Vascular: No abnormal hyperdensity of the major intracranial arteries or dural venous sinuses. No intracranial atherosclerosis. Skull: The visualized skull base, calvarium and extracranial soft tissues are normal. Sinuses/Orbits: No fluid levels or advanced  mucosal thickening of the visualized paranasal sinuses. No mastoid or middle ear effusion. The orbits are normal. ASPECTS [facility] Stroke Program Early CT Score) - Ganglionic level infarction (caudate, lentiform nuclei, internal capsule, insula, M1-M3 cortex): 7 - Supraganglionic infarction (M4-M6 cortex): 3 Total score (0-10 with 10 being normal): 10 IMPRESSION: 1. Chronic ischemic microangiopathy and generalized atrophy without acute intracranial  abnormality. 2. ASPECTS is 10. * These results were communicated to Dr. Kerney Elbe at 7:30 pm on 06/30/2019 by text page via the [facility] messaging system. Electronically Signed   By: Ulyses Jarred M.D.   On: 06/30/2019 19:30    Procedures Procedures (including critical care time) CRITICAL CARE Performed by: Charlesetta Shanks   Total critical care time: 20 minutes  Critical care time was exclusive of separately billable procedures and treating other patients.  Critical care was necessary to treat or prevent imminent or life-threatening deterioration.  Critical care was time spent personally by me on the following activities: development of treatment plan with patient and/or surrogate as well as nursing, discussions with consultants, evaluation of patient's response to treatment, examination of patient, obtaining history from patient or surrogate, ordering and performing treatments and interventions, ordering and review of laboratory studies, ordering and review of radiographic studies, pulse oximetry and re-evaluation of patient's condition. Medications Ordered in ED Medications  LORazepam (ATIVAN) tablet 2 mg (has no administration in time range)  LORazepam (ATIVAN) tablet 1 mg (has no administration in time range)  iohexol (OMNIPAQUE) 350 MG/ML injection 100 mL (100 mLs Intravenous Contrast Given 06/30/19 1940)    ED Course  I have reviewed the triage vital signs and the nursing notes.  Pertinent labs & imaging results that were available during my care of the patient were reviewed by me and considered in my medical decision making (see chart for details).  Clinical Course as of Jun 30 2119  Tue Jun 30, 2019  2120 Consult: Dr. Sabino Gasser [facility] hospitalist for admission.   [MP]    Clinical Course User Index [MP] Charlesetta Shanks, MD   MDM Rules/Calculators/A&P                      Patient presents as outlined above.  He describes abrupt onset of loss of use of his left upper and lower  extremity.  Physical exam is consistent with acute CVA.  Patient has significant right facial droop and left-sided extremity weakness.  Patient did not meet TPA criteria or LVO criteria after evaluation by neurology as a code stroke.  At this time will admit patient for ongoing stroke management.  Patient does have significant alcohol consumption history may have increased risk for withdrawal.  Patient denies prior history of delirium tremens or seizure. Final Clinical Impression(s) / ED Diagnoses Final diagnoses:  Acute ischemic stroke [facility])    Rx / DC Orders ED Discharge Orders    None       Charlesetta Shanks, MD 06/30/19 2130

## 2019-06-30 NOTE — Code Documentation (Signed)
69 yo male to [facility] via [place] code stoke with new onset of left sided weakness and slurred speech. Pts wife reportedly left for work at [employer]. Pt recognized weakness at 1030 this morning so LSW 1030. EMS was not called at this time. Pt presents with left arm and leg weakness, dysarthria and left facial droop. NIHSS 8. CT/CTA/CTP done. No LVO per Dr. Cheral Marker.

## 2019-06-30 NOTE — ED Triage Notes (Signed)
Pt to ED from home.  EMS reports pt's wife left for work this am and pt was fine.  Pt st's he noticed this am at 10:30 that something was wrong but did not call EMS   When pt's wife returned home from work she called EMS

## 2019-06-30 NOTE — H&P (Signed)
History and Physical    Russell Russell DOB: Jul 25, 1950 DOA: 06/30/2019  PCP: Russell Coke, PA Patient coming from: Home  I have personally briefly reviewed patient's old medical records in [place]  Chief Complaint: Left-sided weakness and found down at home  HPI: [name] is a 69 year old AA male with PMH HTN, alcohol abuse (ongoing use), HLD, GERD, PUD, tobacco use who presented to the ER after being found down at home in the bathroom.  History is provided by the patient who is able to provide collateral information and full HPI as needed.  His wife has already left the ER and went home. He states that she was at work around 1030 this morning when he was attempting to go to the bathroom and collapsed to the floor.  He did not pass out and does recall all the events of falling.  He also denies hitting his head.  He laid on the ground due to weakness until his wife came home around 5:30 PM.  He notes being extremely weak in his left upper and lower extremity.  His speech has also been slurred since the episode occurred and he has a very noticeable left facial droop. He was brought to the ER via EMS and underwent work-up for code stroke. Workup was notable for acute stroke in the right frontal lobe and left cerebellum.  He underwent neuro eval in the ER as well and is admitted for further stroke workup and monitoring.   On exam, he has significant weakness involving his left upper and lower extremity.  He also has significant dysarthria but no true aphasia.  Left-sided facial droop is also obvious.  He had no significant drooling at time of my interview. He does endorse drinking approximately 3-4 beers daily and approximately half a pint of gin daily.  His last drink was Monday evening.  He also continues to smoke approximately 0.5 PPD x12 years.    Review of Systems: As per HPI otherwise 10 point review of systems negative.   Past Medical History:  Diagnosis  Date  . Alcohol abuse    drinks 1 gallon of Gin every weekend, nothing during the week x >15 yrs  . Elevated transaminase level    +elev bili: abd u/s 06/2014 showed stable small hepatic hemangiomas, o/w normal.  . Essential hypertension   . GERD (gastroesophageal reflux disease)   . Hyperlipidemia 03/2014   Atorv started-chol improved  . Impaired fasting glucose 03/2014  . Nephrolithiasis   . PUD (peptic ulcer disease)   . Tobacco dependence    chantix: "psych effects"    Past Surgical History:  Procedure Laterality Date  . COLONOSCOPY  2005   Recall 10 yrs ([place])  . removal of kidney stones  2006   cystoscopic--removed from ureter.  No prob since.     reports that he has been smoking cigarettes. He has a 10.00 pack-year smoking history. He has never used smokeless tobacco. He reports current alcohol use of about 8.0 standard drinks of alcohol per week. He reports that he does not use drugs.  Allergies  Allergen Reactions  . Penicillins Rash    Family History  Problem Relation Age of Onset  . Colon cancer Mother   . Cancer Father   . Breast cancer Sister   . Breast cancer Sister     Prior to Admission medications   Medication Sig Start Date End Date Taking? Authorizing Provider  atorvastatin (LIPITOR) 40 MG tablet Take 1 tablet (  40 mg total) by mouth daily. 02/25/19  Yes Russell Coke, PA  amLODipine (NORVASC) 10 MG tablet Take 1 tablet (10 mg total) by mouth daily. Patient not taking: Reported on 06/30/2019 03/24/19   Russell Coke, PA  halobetasol (ULTRAVATE) 0.05 % cream Apply topically 2 (two) times daily. Patient not taking: Reported on 06/30/2019 12/18/17   Briscoe Deutscher, DO    Physical Exam: Vitals:   06/30/19 2200 06/30/19 2215 06/30/19 2230 06/30/19 2245  BP: (!) 177/137 (!) 162/123 (!) 168/125 (!) 167/124  Pulse: 91 79 80 77  Resp: 20 16 17  (!) 33  SpO2: 95% 97% 95% 96%  Weight:        General appearance: alert, cooperative, no distress and  Obvious dysarthria Head: Normocephalic, without obvious abnormality, atraumatic Eyes: PERRL, EOMI Lungs: clear to auscultation bilaterally and No wheezing appreciated Heart: regular rate and rhythm and S1, S2 normal Abdomen: normal findings: bowel sounds normal and soft, non-tender Extremities: No edema appreciated Pulses: 2+ and symmetric Skin: mobility and turgor normal and Lower extremity eczema appreciated Neurologic: LLE 2/5, LUE 2/5. Right side strength 5/5. No perceived paresthesias. No obvious dysmetria in RUE, unable to test LUE due to severe weakness. No resting tremor appreciated. Gait deferred.   Labs on Admission: I have personally reviewed following labs and imaging studies  CBC: Recent Labs  Lab 06/30/19 1919 06/30/19 1926  WBC 4.6  --   NEUTROABS 3.4  --   HGB 19.2* 20.1*  HCT 54.7* 59.0*  MCV 91.8  --   PLT 190  --    Basic Metabolic Panel: Recent Labs  Lab 06/30/19 1919 06/30/19 1926  NA 136 137  K 4.0 3.8  CL 101 101  CO2 23  --   GLUCOSE 134* 132*  BUN 7* 8  CREATININE 1.16 0.90  CALCIUM 9.4  --    GFR: Estimated Creatinine Clearance: 77.2 mL/min (by C-G formula based on SCr of 0.9 mg/dL). Liver Function Tests: Recent Labs  Lab 06/30/19 1919  AST 53*  ALT 29  ALKPHOS 100  BILITOT 1.4*  PROT 7.3  ALBUMIN 3.3*   No results for input(s): LIPASE, AMYLASE in the last 168 hours. No results for input(s): AMMONIA in the last 168 hours. Coagulation Profile: Recent Labs  Lab 06/30/19 1955  INR 1.0   Cardiac Enzymes: No results for input(s): CKTOTAL, CKMB, CKMBINDEX, TROPONINI in the last 168 hours. BNP (last 3 results) No results for input(s): PROBNP in the last 8760 hours. HbA1C: No results for input(s): HGBA1C in the last 72 hours. CBG: Recent Labs  Lab 06/30/19 1958  GLUCAP 106*   Lipid Profile: No results for input(s): CHOL, HDL, LDLCALC, TRIG, CHOLHDL, LDLDIRECT in the last 72 hours. Thyroid Function Tests: No results for  input(s): TSH, T4TOTAL, FREET4, T3FREE, THYROIDAB in the last 72 hours. Anemia Panel: No results for input(s): VITAMINB12, FOLATE, FERRITIN, TIBC, IRON, RETICCTPCT in the last 72 hours. Urine analysis:    Component Value Date/Time   COLORURINE YELLOW 06/30/2018 0950   APPEARANCEUR CLEAR 06/30/2018 0950   LABSPEC 1.009 06/30/2018 0950   PHURINE 6.0 06/30/2018 0950   GLUCOSEU NEGATIVE 06/30/2018 0950   HGBUR SMALL (A) 06/30/2018 0950   BILIRUBINUR NEGATIVE 06/30/2018 0950   KETONESUR NEGATIVE 06/30/2018 0950   PROTEINUR 100 (A) 06/30/2018 0950   UROBILINOGEN 0.2 09/13/2014 1130   NITRITE NEGATIVE 06/30/2018 0950   LEUKOCYTESUR NEGATIVE 06/30/2018 0950    Radiological Exams on Admission: CT Code Stroke CTA Head W/WO contrast  Result Date:  06/30/2019 CLINICAL DATA:  Left-sided weakness EXAM: CT ANGIOGRAPHY HEAD AND NECK CT PERFUSION BRAIN TECHNIQUE: Multidetector CT imaging of the head and neck was performed using the standard protocol during bolus administration of intravenous contrast. Multiplanar CT image reconstructions and MIPs were obtained to evaluate the vascular anatomy. Carotid stenosis measurements (when applicable) are obtained utilizing NASCET criteria, using the distal internal carotid diameter as the denominator. Multiphase CT imaging of the brain was performed following IV bolus contrast injection. Subsequent parametric perfusion maps were calculated using RAPID software. CONTRAST:  121mL OMNIPAQUE IOHEXOL 350 MG/ML SOLN COMPARISON:  None. FINDINGS: CTA NECK FINDINGS SKELETON: There is no bony spinal canal stenosis. No lytic or blastic lesion. OTHER NECK: Normal pharynx, larynx and major salivary glands. No cervical lymphadenopathy. Unremarkable thyroid gland. UPPER CHEST: No pneumothorax or pleural effusion. No nodules or masses. AORTIC ARCH: There is mild calcific atherosclerosis of the aortic arch. 3.6 cm diameter of descending thoracic aorta arch. Conventional 3 vessel aortic  branching pattern. The visualized proximal subclavian arteries are widely patent. RIGHT CAROTID SYSTEM: Normal without aneurysm, dissection or stenosis. LEFT CAROTID SYSTEM: Normal without aneurysm, dissection or stenosis. VERTEBRAL ARTERIES: Right dominant configuration. Both origins are clearly patent. There is no dissection, occlusion or flow-limiting stenosis to the skull base (V1-V3 segments). CTA HEAD FINDINGS POSTERIOR CIRCULATION: --Vertebral arteries: Normal V4 segments. --Posterior inferior cerebellar arteries (PICA): Patent origins from the vertebral arteries. --Anterior inferior cerebellar arteries (AICA): Patent origins from the basilar artery. --Basilar artery: Normal. --Superior cerebellar arteries: Normal. --Posterior cerebral arteries: Normal. Both originate from the basilar artery. Posterior communicating arteries (p-comm) are diminutive or absent. ANTERIOR CIRCULATION: --Intracranial internal carotid arteries: Normal. --Anterior cerebral arteries (ACA): Normal. Both A1 segments are present. Patent anterior communicating artery (a-comm). --Middle cerebral arteries (MCA): Normal. VENOUS SINUSES: As permitted by contrast timing, patent. ANATOMIC VARIANTS: None Review of the MIP images confirms the above findings. CT Brain Perfusion Findings: ASPECTS: 10 CBF (<30%) Volume: 44mL Perfusion (Tmax>6.0s) volume: 49mL Mismatch Volume: 59mL Infarction Location:Areas of elevated Tmax are located in the right frontal periventricular white matter and left cerebellum. These regions correspond to areas of hypoattenuation on the noncontrast head CT, including an old left cerebellar infarct. IMPRESSION: 1. No emergent large vessel occlusion or high-grade stenosis of the intracranial arteries. 2. Area of ischemia identified in the right frontal periventricular white matter and left cerebellum, corresponding to the areas of hypoattenuation on the noncontrast head CT and possibly artifact/pseudonormalization. No core  infarct by CT perfusion analysis. 3. Aortic Atherosclerosis (ICD10-I70.0). 4. 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 Recommend annual imaging followup by CTA or MRA. This recommendation follows 2010 ACCF/AHA/AATS/ACR/ASA/SCA/SCAI/SIR/STS/SVM Guidelines for the Diagnosis and Management of Patients with Thoracic Aortic Disease. Circulation.2010; [id number]:[id number]. Aortic aneurysm NOS (ICD10-I71.9) Electronically Signed   By: Ulyses Jarred M.D.   On: 06/30/2019 20:03   CT Code Stroke CTA Neck W/WO contrast  Result Date: 06/30/2019 CLINICAL DATA:  Left-sided weakness EXAM: CT ANGIOGRAPHY HEAD AND NECK CT PERFUSION BRAIN TECHNIQUE: Multidetector CT imaging of the head and neck was performed using the standard protocol during bolus administration of intravenous contrast. Multiplanar CT image reconstructions and MIPs were obtained to evaluate the vascular anatomy. Carotid stenosis measurements (when applicable) are obtained utilizing NASCET criteria, using the distal internal carotid diameter as the denominator. Multiphase CT imaging of the brain was performed following IV bolus contrast injection. Subsequent parametric perfusion maps were calculated using RAPID software. CONTRAST:  133mL OMNIPAQUE IOHEXOL 350 MG/ML SOLN COMPARISON:  None. FINDINGS: CTA NECK FINDINGS  SKELETON: There is no bony spinal canal stenosis. No lytic or blastic lesion. OTHER NECK: Normal pharynx, larynx and major salivary glands. No cervical lymphadenopathy. Unremarkable thyroid gland. UPPER CHEST: No pneumothorax or pleural effusion. No nodules or masses. AORTIC ARCH: There is mild calcific atherosclerosis of the aortic arch. 3.6 cm diameter of descending thoracic aorta arch. Conventional 3 vessel aortic branching pattern. The visualized proximal subclavian arteries are widely patent. RIGHT CAROTID SYSTEM: Normal without aneurysm, dissection or stenosis. LEFT CAROTID SYSTEM: Normal without aneurysm, dissection or stenosis. VERTEBRAL ARTERIES: Right  dominant configuration. Both origins are clearly patent. There is no dissection, occlusion or flow-limiting stenosis to the skull base (V1-V3 segments). CTA HEAD FINDINGS POSTERIOR CIRCULATION: --Vertebral arteries: Normal V4 segments. --Posterior inferior cerebellar arteries (PICA): Patent origins from the vertebral arteries. --Anterior inferior cerebellar arteries (AICA): Patent origins from the basilar artery. --Basilar artery: Normal. --Superior cerebellar arteries: Normal. --Posterior cerebral arteries: Normal. Both originate from the basilar artery. Posterior communicating arteries (p-comm) are diminutive or absent. ANTERIOR CIRCULATION: --Intracranial internal carotid arteries: Normal. --Anterior cerebral arteries (ACA): Normal. Both A1 segments are present. Patent anterior communicating artery (a-comm). --Middle cerebral arteries (MCA): Normal. VENOUS SINUSES: As permitted by contrast timing, patent. ANATOMIC VARIANTS: None Review of the MIP images confirms the above findings. CT Brain Perfusion Findings: ASPECTS: 10 CBF (<30%) Volume: 41mL Perfusion (Tmax>6.0s) volume: 35mL Mismatch Volume: 64mL Infarction Location:Areas of elevated Tmax are located in the right frontal periventricular white matter and left cerebellum. These regions correspond to areas of hypoattenuation on the noncontrast head CT, including an old left cerebellar infarct. IMPRESSION: 1. No emergent large vessel occlusion or high-grade stenosis of the intracranial arteries. 2. Area of ischemia identified in the right frontal periventricular white matter and left cerebellum, corresponding to the areas of hypoattenuation on the noncontrast head CT and possibly artifact/pseudonormalization. No core infarct by CT perfusion analysis. 3. Aortic Atherosclerosis (ICD10-I70.0). 4. 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 Recommend annual imaging followup by CTA or MRA. This recommendation follows 2010 ACCF/AHA/AATS/ACR/ASA/SCA/SCAI/SIR/STS/SVM Guidelines for the  Diagnosis and Management of Patients with Thoracic Aortic Disease. Circulation.2010; [id number]. Aortic aneurysm NOS (ICD10-I71.9) Electronically Signed   By: Ulyses Jarred M.D.   On: 06/30/2019 20:03   CT Code Stroke Cerebral Perfusion with contrast  Result Date: 06/30/2019 CLINICAL DATA:  Left-sided weakness EXAM: CT ANGIOGRAPHY HEAD AND NECK CT PERFUSION BRAIN TECHNIQUE: Multidetector CT imaging of the head and neck was performed using the standard protocol during bolus administration of intravenous contrast. Multiplanar CT image reconstructions and MIPs were obtained to evaluate the vascular anatomy. Carotid stenosis measurements (when applicable) are obtained utilizing NASCET criteria, using the distal internal carotid diameter as the denominator. Multiphase CT imaging of the brain was performed following IV bolus contrast injection. Subsequent parametric perfusion maps were calculated using RAPID software. CONTRAST:  178mL OMNIPAQUE IOHEXOL 350 MG/ML SOLN COMPARISON:  None. FINDINGS: CTA NECK FINDINGS SKELETON: There is no bony spinal canal stenosis. No lytic or blastic lesion. OTHER NECK: Normal pharynx, larynx and major salivary glands. No cervical lymphadenopathy. Unremarkable thyroid gland. UPPER CHEST: No pneumothorax or pleural effusion. No nodules or masses. AORTIC ARCH: There is mild calcific atherosclerosis of the aortic arch. 3.6 cm diameter of descending thoracic aorta arch. Conventional 3 vessel aortic branching pattern. The visualized proximal subclavian arteries are widely patent. RIGHT CAROTID SYSTEM: Normal without aneurysm, dissection or stenosis. LEFT CAROTID SYSTEM: Normal without aneurysm, dissection or stenosis. VERTEBRAL ARTERIES: Right dominant configuration. Both origins are clearly patent. There is no dissection, occlusion  or flow-limiting stenosis to the skull base (V1-V3 segments). CTA HEAD FINDINGS POSTERIOR CIRCULATION: --Vertebral arteries: Normal V4 segments. --Posterior  inferior cerebellar arteries (PICA): Patent origins from the vertebral arteries. --Anterior inferior cerebellar arteries (AICA): Patent origins from the basilar artery. --Basilar artery: Normal. --Superior cerebellar arteries: Normal. --Posterior cerebral arteries: Normal. Both originate from the basilar artery. Posterior communicating arteries (p-comm) are diminutive or absent. ANTERIOR CIRCULATION: --Intracranial internal carotid arteries: Normal. --Anterior cerebral arteries (ACA): Normal. Both A1 segments are present. Patent anterior communicating artery (a-comm). --Middle cerebral arteries (MCA): Normal. VENOUS SINUSES: As permitted by contrast timing, patent. ANATOMIC VARIANTS: None Review of the MIP images confirms the above findings. CT Brain Perfusion Findings: ASPECTS: 10 CBF (<30%) Volume: 14mL Perfusion (Tmax>6.0s) volume: 87mL Mismatch Volume: 10mL Infarction Location:Areas of elevated Tmax are located in the right frontal periventricular white matter and left cerebellum. These regions correspond to areas of hypoattenuation on the noncontrast head CT, including an old left cerebellar infarct. IMPRESSION: 1. No emergent large vessel occlusion or high-grade stenosis of the intracranial arteries. 2. Area of ischemia identified in the right frontal periventricular white matter and left cerebellum, corresponding to the areas of hypoattenuation on the noncontrast head CT and possibly artifact/pseudonormalization. No core infarct by CT perfusion analysis. 3. Aortic Atherosclerosis (ICD10-I70.0). 4. 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 Recommend annual imaging followup by CTA or MRA. This recommendation follows 2010 ACCF/AHA/AATS/ACR/ASA/SCA/SCAI/SIR/STS/SVM Guidelines for the Diagnosis and Management of Patients with Thoracic Aortic Disease. Circulation.2010; [id number]. Aortic aneurysm NOS (ICD10-I71.9) Electronically Signed   By: Ulyses Jarred M.D.   On: 06/30/2019 20:03   CT HEAD CODE STROKE WO  CONTRAST  Result Date: 06/30/2019 CLINICAL DATA:  Code stroke.  Left-sided weakness EXAM: CT HEAD WITHOUT CONTRAST TECHNIQUE: Contiguous axial images were obtained from the base of the skull through the vertex without intravenous contrast. COMPARISON:  None. FINDINGS: Brain: There is no mass, hemorrhage or extra-axial collection. There is generalized atrophy without lobar predilection. There is hypoattenuation of the periventricular white matter, most commonly indicating chronic ischemic microangiopathy. Old left basal ganglia small vessel infarct. Vascular: No abnormal hyperdensity of the major intracranial arteries or dural venous sinuses. No intracranial atherosclerosis. Skull: The visualized skull base, calvarium and extracranial soft tissues are normal. Sinuses/Orbits: No fluid levels or advanced mucosal thickening of the visualized paranasal sinuses. No mastoid or middle ear effusion. The orbits are normal. ASPECTS [facility] Stroke Program Early CT Score) - Ganglionic level infarction (caudate, lentiform nuclei, internal capsule, insula, M1-M3 cortex): 7 - Supraganglionic infarction (M4-M6 cortex): 3 Total score (0-10 with 10 being normal): 10 IMPRESSION: 1. Chronic ischemic microangiopathy and generalized atrophy without acute intracranial abnormality. 2. ASPECTS is 10. * These results were communicated to Dr. Kerney Elbe at 7:30 pm on 06/30/2019 by text page via the [facility] messaging system. Electronically Signed   By: Ulyses Jarred M.D.   On: 06/30/2019 19:30    EKG: Independently reviewed. Sinus tach  Assessment/Plan  Acute CVA - last seen normal around 10:30 am and found by wife at 5:30 pm. Patient outside TPA window and not a candidate for invasive intervention - seen by neuro in ER as well; hold off on statin due to etoh use/mild elevated LFTs; could re-consider if improves and patient actively willing to abstain going forward given dense stroke - patient not safe for PO at time of my  evaluation; d/c PO ativan and start on CIWA with IV route - await SLP eval in am and change to PO meds if/when safe -  PT/OT evals - permissive HTN, however DBP >120 in the ER during my assessment; labetalol PRN ordered for SBP>220 OR DBP>120, hold for HR<60 - continue neuro checks - obtain brain MRI and echo with bubble - asa daily (rectal for now until SLP eval) - check lipid, TSH, A1c - per neuro: ordered B1, B12, ceruloplasmin, Vit E, and folate levels  HTN -Permissive hypertension for now until 10:30 AM on 07/01/2019 -Patient not on home medications and states blood pressure is controlled at home which he checks his pressure "twice daily".  Typically sees blood pressures in the 130s over 70s  Alcohol abuse -Patient endorses drinking 3-4 beers daily and half a pint of gin daily.  Last drink was Monday evening -Continue on CIWA protocol -Monitor LFTs.  Tobacco use -Use nicotine patch if needed  DVT prophylaxis: enoxaparin (Lovenox) 40mg  SQ 2 hours prior to surgery then every day Code Status: Full code Family Communication: None.  Wife not present Disposition Plan: likely will need ST rehab Consults called: Neuro, Dr. Cheral Marker Admission status: Inpatient/med tele  Dwyane Dee, MD [facility] Pager [phone]  If 7PM-7AM, please contact night-coverage [email or web address] Use universal [place] password for that web site. If you do not have the password, please call the hospital operator.  06/30/2019, 11:08 PM

## 2019-06-30 NOTE — ED Provider Notes (Signed)
Patient was not seen by myself of the bridge for code stroke.  Other provider cleared for stroke evaluation.  Patient has not been to assigned room yet.  To this point, I have not been able to evaluate the patient.   Charlesetta Shanks, MD 06/30/19 [phone]

## 2019-06-30 NOTE — ED Notes (Signed)
Admitting MD in to assess pt for admission 

## 2019-06-30 NOTE — Consult Note (Signed)
Referring Physician: Dr. Johnney Killian    Chief Complaint: Left sided weakness  HPI: Russell Russell is an 69 y.o. male presenting to the [facility] ED via EMS as a Code Stroke. Last seen by wife at 38 when she went to work. He noticed onset weakness at 10:30 AM but did not call for help. When wife returned this evening she noticed the weakness and EMS was called.   His PMHx includes EtOH abuse, HTN, HLD and tobacco abuse.   He states that he has no prior history of stroke or MI. Denies headache, vision changes, trouble talking or understanding speech, numbness/tingling, CP, SOB or abdominal pain. No bleeding problems.   He is not on any medications.   LSN: [id number] tPA Given: No: Out of the time window NIHSS: 9  Past Medical History:  Diagnosis Date  . Alcohol abuse    drinks 1 gallon of Gin every weekend, nothing during the week x >15 yrs  . Elevated transaminase level    +elev bili: abd u/s 06/2014 showed stable small hepatic hemangiomas, o/w normal.  . Essential hypertension   . GERD (gastroesophageal reflux disease)   . Hyperlipidemia 03/2014   Atorv started-chol improved  . Impaired fasting glucose 03/2014  . Nephrolithiasis   . PUD (peptic ulcer disease)   . Tobacco dependence    chantix: "psych effects"    Past Surgical History:  Procedure Laterality Date  . COLONOSCOPY  2005   Recall 10 yrs ([place])  . removal of kidney stones  2006   cystoscopic--removed from ureter.  No prob since.    Family History  Problem Relation Age of Onset  . Colon cancer Mother   . Cancer Father   . Breast cancer Sister   . Breast cancer Sister    Social History:  reports that he has been smoking cigarettes. He has a 10.00 pack-year smoking history. He has never used smokeless tobacco. He reports current alcohol use of about 8.0 standard drinks of alcohol per week. He reports that he does not use drugs.  Allergies:  Allergies  Allergen Reactions  . Penicillins Rash    Home  Medications: The patient is not on any medications.   ROS: As per HPI. Comprehensive ROS otherwise negative.    Physical Examination: There were no vitals taken for this visit.  HEENT: [place]/AT Lungs: Respirations unlabored Ext: Dry skin with sores to distal BLE  Neurologic Examination: Mental Status: Awake and alert. Speech is slow but fluent. Mild dysarthria. Comprehension intact. Able to answer questions about his past history.  Cranial Nerves: II:  Visual fields intact with no extinction to DSS. PERRL.  III,IV, VI: Ptosis not present, EOMI with saccadic pursuits noted. No nystagmus.  V,VII: Left facial droop. Facial temp sensation intact bilaterally VIII: Hearing intact to voice IX,X: Nasal speech noted.  XI: Left sided weakness.  XII: Midline tongue extension  Motor: RUE and RLE 5/5 LUE 4-/5 grip, 3/5 biceps, 2/5 deltoid LLE 1-2/5 proximally and distally.  Sensory: Temp sensation subjectively intact to BUE and BLE. FT intact. No extinction to DSS.  Deep Tendon Reflexes:  2+ right brachioradialis and biceps 3+ left brachioradialis and biceps 4+ bilateral patellae and achilles (crossed adductors) Cerebellar: No ataxia with right FNF. Unable to perform FNF on the left Prominent ataxia with H-S on the right, unable to perform on the left.  Gait: Unable to assess.    No results found for this or any previous visit (from the past 48 hour(s)). No results found.  Assessment: 69 y.o. male presenting via EMS with acute onset of left facial droop and left upper and lower extremity weakness 1. Exam findings and history best localize an acute lesion to the right cerebral hemisphere, subcortically. Most likely an acute lacunar infarction in the internal capsule or a right basal ganglia infarction.  2. Stroke Risk Factors - HTN, HLD and tobacco abuse.  3. History of EtOH abuse. Drinks 1 pint per day of liquor, per patient. His drinking got significantly worse after he retired from his  job in October.  4. Pathological hyperreflexia on exam is also consistent with a chronic cervical and/or thoracic myelopathy. Given his heavy EtOH use, a nutritional myelopathy should be considered. A prior episode of central pontine myelinolysis could also present with these findings.  5. CT head without acute abnormality. Chronic ischemic microangiopathy and generalized atrophy are noted.  6. CTA of head and neck: No emergent LVO or high-grade stenosis. 7. CTP: Area of ischemia identified in the right frontal periventricular white matter and left cerebellum, corresponding to the areas of hypoattenuation on the noncontrast head CT and possibly artifact/pseudonormalization. No core infarct by CT perfusion analysis.  Recommendations: 1. HgbA1c, fasting lipid panel 2. MRI of the brain without contrast 3. PT consult, OT consult, Speech consult 4. Echocardiogram 5. Would avoid statin use given his heavy drinking.  6. Prophylactic therapy- Start ASA 81 mg po qd 7. Risk factor modification 8. Telemetry monitoring 9. Frequent neuro checks 10. Permissive HTN until 10:30 AM on Wednesday.  11. Will need scheduled benzodiazepine to prevent acute EtOH withdrawal: Ativan 2 mg po q6h x 1 day, then Ativan 1 mg po q6h x 2 days, then PRN (ordered).  12. Also will need CIWA protocol with supplemental benzodiazepine PRN (ordered).  13. Obtain thiamine, B12, copper, vitamin E and folate levels.  14. EtOH cessation counseling.  @Electronically  signed: Dr. Kerney Elbe 06/30/2019, 7:26 PM

## 2019-07-01 ENCOUNTER — Inpatient Hospital Stay (HOSPITAL_COMMUNITY): Payer: BC Managed Care – PPO

## 2019-07-01 DIAGNOSIS — I6389 Other cerebral infarction: Secondary | ICD-10-CM

## 2019-07-01 DIAGNOSIS — I639 Cerebral infarction, unspecified: Secondary | ICD-10-CM

## 2019-07-01 DIAGNOSIS — I6340 Cerebral infarction due to embolism of unspecified cerebral artery: Principal | ICD-10-CM

## 2019-07-01 LAB — URINALYSIS, ROUTINE W REFLEX MICROSCOPIC
Bacteria, UA: NONE SEEN
Bilirubin Urine: NEGATIVE
Glucose, UA: NEGATIVE mg/dL
Ketones, ur: 5 mg/dL — AB
Leukocytes,Ua: NEGATIVE
Nitrite: NEGATIVE
Protein, ur: 100 mg/dL — AB
Specific Gravity, Urine: >1.046 — ABNORMAL HIGH (ref 1.005–1.030)
pH: 7 (ref 5.0–8.0)

## 2019-07-01 LAB — CBC
HCT: 52.3 % — ABNORMAL HIGH (ref 39.0–52.0)
Hemoglobin: 18 g/dL — ABNORMAL HIGH (ref 13.0–17.0)
MCH: 31.7 pg (ref 26.0–34.0)
MCHC: 34.4 g/dL (ref 30.0–36.0)
MCV: 92.2 fL (ref 80.0–100.0)
Platelets: 216 10*3/uL (ref 150–400)
RBC: 5.67 MIL/uL (ref 4.22–5.81)
RDW: 15.8 % — ABNORMAL HIGH (ref 11.5–15.5)
WBC: 3.4 10*3/uL — ABNORMAL LOW (ref 4.0–10.5)
nRBC: 0 % (ref 0.0–0.2)

## 2019-07-01 LAB — LIPID PANEL
Cholesterol: 196 mg/dL (ref 0–200)
HDL: 77 mg/dL (ref >40)
LDL Cholesterol: 109 mg/dL — ABNORMAL HIGH (ref 0–99)
Total CHOL/HDL Ratio: 2.5 RATIO
Triglycerides: 52 mg/dL (ref <150)
VLDL: 10 mg/dL (ref 0–40)

## 2019-07-01 LAB — BASIC METABOLIC PANEL
Anion gap: 17 — ABNORMAL HIGH (ref 5–15)
BUN: 6 mg/dL — ABNORMAL LOW (ref 8–23)
CO2: 19 mmol/L — ABNORMAL LOW (ref 22–32)
Calcium: 9.1 mg/dL (ref 8.9–10.3)
Chloride: 102 mmol/L (ref 98–111)
Creatinine, Ser: 0.98 mg/dL (ref 0.61–1.24)
GFR calc Af Amer: >60 mL/min (ref >60)
GFR calc non Af Amer: >60 mL/min (ref >60)
Glucose, Bld: 133 mg/dL — ABNORMAL HIGH (ref 70–99)
Potassium: 3.5 mmol/L (ref 3.5–5.1)
Sodium: 138 mmol/L (ref 135–145)

## 2019-07-01 LAB — RAPID URINE DRUG SCREEN, HOSP PERFORMED
Amphetamines: NOT DETECTED
Barbiturates: NOT DETECTED
Benzodiazepines: NOT DETECTED
Cocaine: NOT DETECTED
Opiates: NOT DETECTED
Tetrahydrocannabinol: NOT DETECTED

## 2019-07-01 LAB — MAGNESIUM: Magnesium: 1.7 mg/dL (ref 1.7–2.4)

## 2019-07-01 LAB — SARS CORONAVIRUS 2 (TAT 6-24 HRS): SARS Coronavirus 2: NEGATIVE

## 2019-07-01 LAB — HEMOGLOBIN A1C
Hgb A1c MFr Bld: 6.2 % — ABNORMAL HIGH (ref 4.8–5.6)
Mean Plasma Glucose: 131.24 mg/dL

## 2019-07-01 LAB — VITAMIN B12: Vitamin B-12: 383 pg/mL (ref 180–914)

## 2019-07-01 LAB — FOLATE: Folate: 5.6 ng/mL — ABNORMAL LOW (ref >5.9)

## 2019-07-01 LAB — TSH: TSH: 2.209 u[IU]/mL (ref 0.350–4.500)

## 2019-07-01 IMAGING — MR MR HEAD W/O CM
10 of 12 series · 38 of 48 positions shown · non-contrast
Comparison: Prior CTs from [DATE].

CLINICAL DATA: Follow-up examination for acute stroke.

EXAM:
MRI HEAD WITHOUT CONTRAST
TECHNIQUE: Multiplanar, multiecho pulse sequences of the brain and surrounding
structures were obtained without intravenous contrast.

[Series 5: DWI · axial · 3.0mm · 0.88mm/px · z∈[-61,+75]mm · 8 of 94 slices shown (1 of 4)]
[im 1/94]
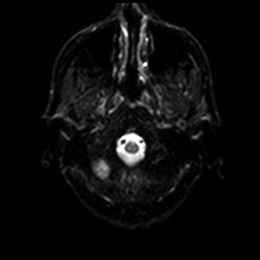
[im 14/94]
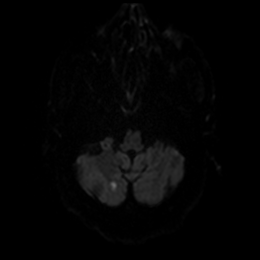
[im 27/94]
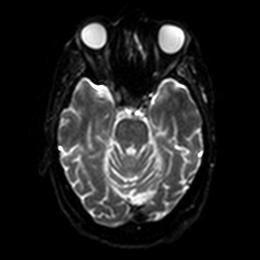
[im 40/94]
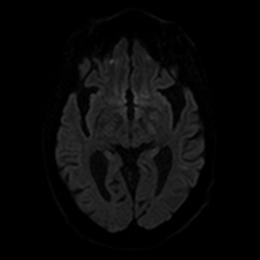
[im 54/94]
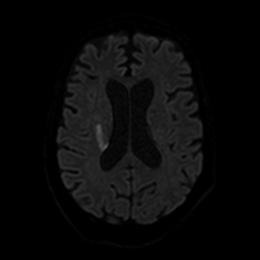
[im 67/94]
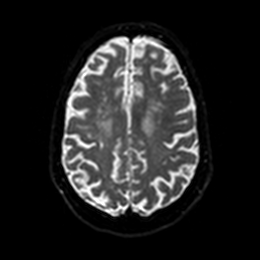
[im 80/94]
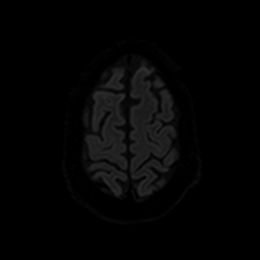
[im 94/94]
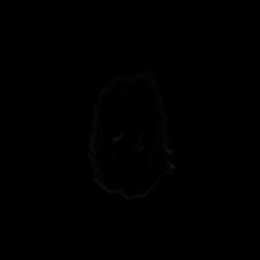

[Series 6: DWI · axial · 3.0mm · 0.88mm/px · z∈[-61,+75]mm · 4 of 47 slices shown (2 of 4)]
[im 1/47]
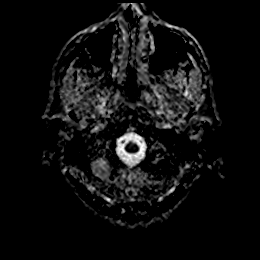
[im 16/47]
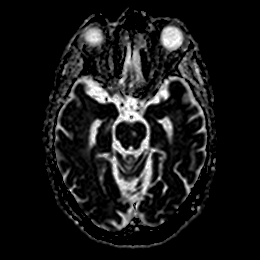
[im 31/47]
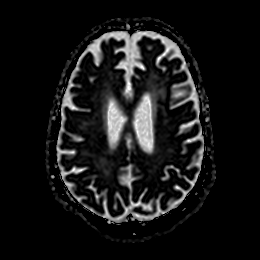
[im 47/47]
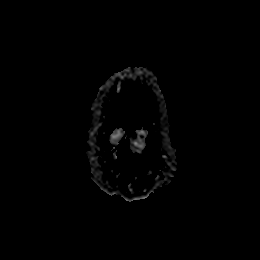

[Series 7: DWI · coronal · 4.0mm · 0.88mm/px · 6 of 70 slices shown (3 of 4)]
[im 1/70]
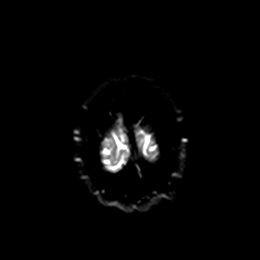
[im 14/70]
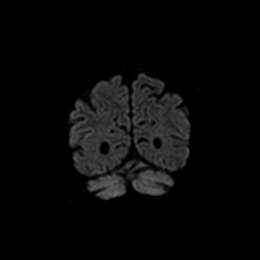
[im 28/70]
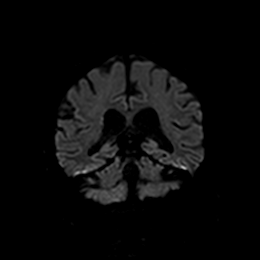
[im 42/70]
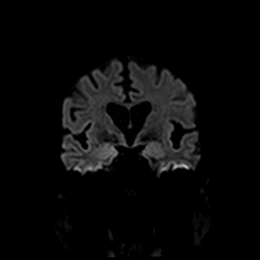
[im 56/70]
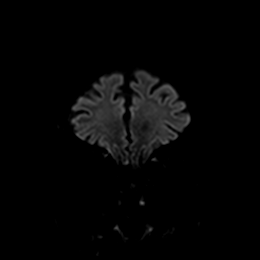
[im 70/70]
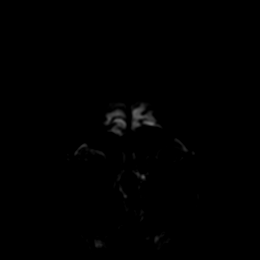

[Series 8: DWI · coronal · 4.0mm · 0.88mm/px · 3 of 35 slices shown (4 of 4)]
[im 1/35]
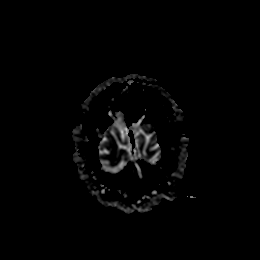
[im 18/35]
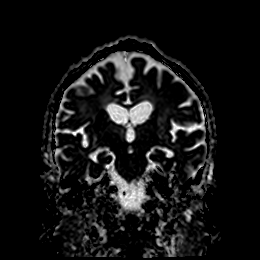
[im 35/35]
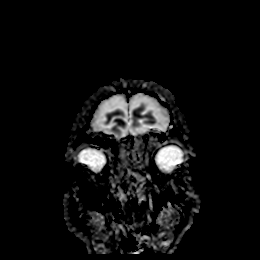

[Series 9: T1 · sagittal · 5.0mm · 0.78mm/px · 2 of 25 slices shown]
[im 1/25]
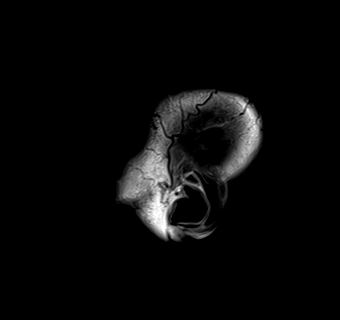
[im 25/25]
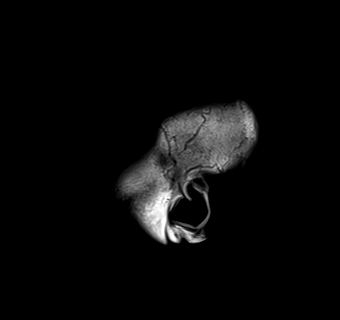

[Series 10: T2 · axial · 5.0mm · 0.72mm/px · z∈[-63,+77]mm · 2 of 25 slices shown (1 of 2)]
[im 1/25]
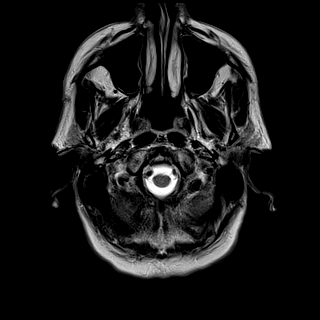
[im 25/25]
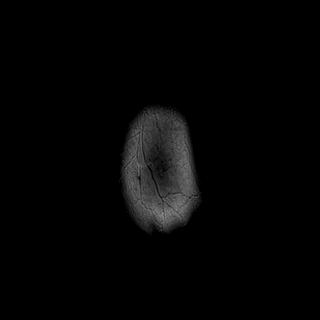

[Series 11: FLAIR · axial · 5.0mm · 0.45mm/px · z∈[-60,+81]mm · 2 of 25 slices shown]
[im 1/25]
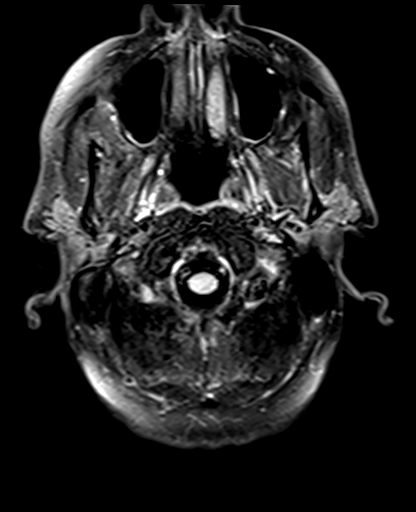
[im 25/25]
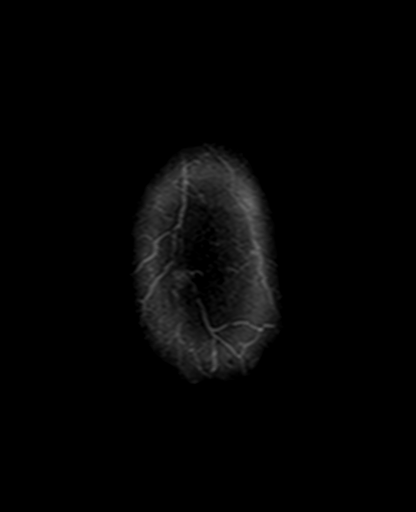

[Series 13: pha_images · axial · 3.0mm · 0.90mm/px · z∈[-77,+88]mm · 4 of 54 slices shown]
[im 1/54]
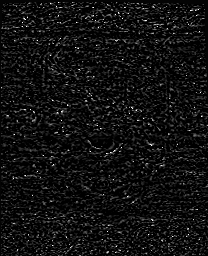
[im 18/54]
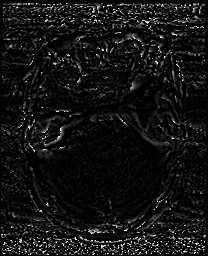
[im 36/54]
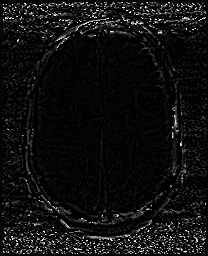
[im 54/54]
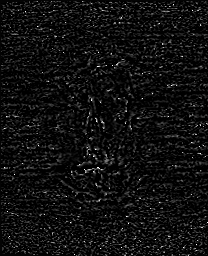

[Series 14: swi_images · axial · 3.0mm · 0.90mm/px · z∈[-77,+97]mm · 5 of 60 slices shown]
[im 1/60]
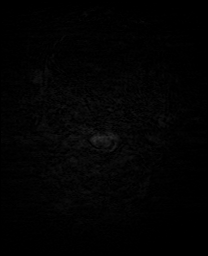
[im 15/60]
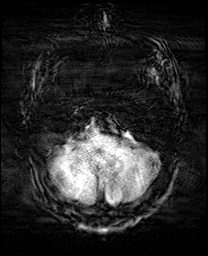
[im 30/60]
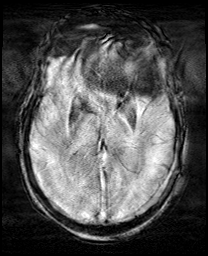
[im 45/60]
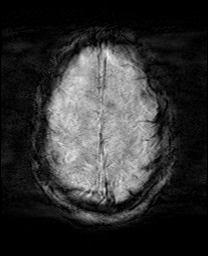
[im 60/60]
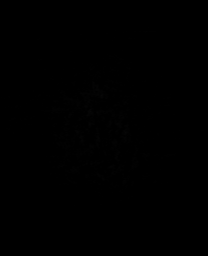

[Series 18: T2 · coronal · 5.0mm · 0.34mm/px · 2 of 29 slices shown (2 of 2)]
[im 1/29]
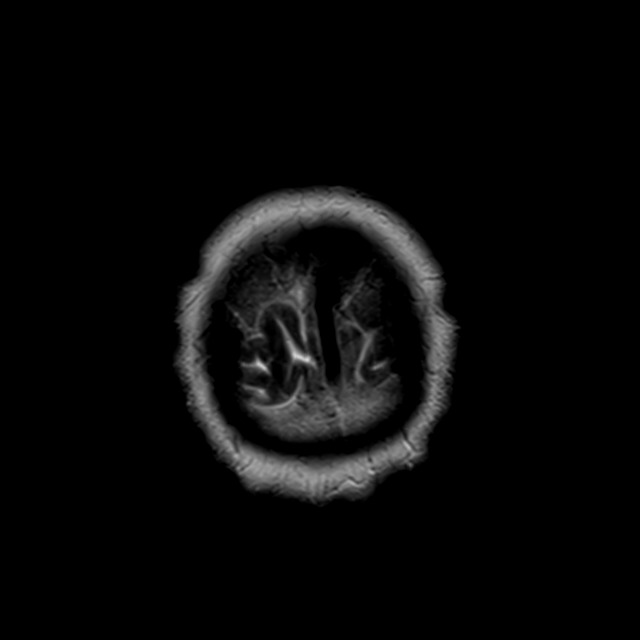
[im 29/29]
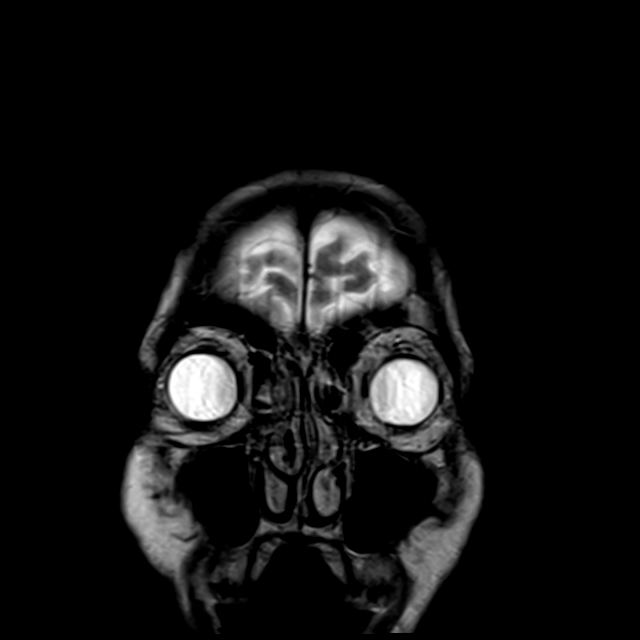

[38 of 48 positions shown; findings below may reference images not displayed]

FINDINGS: Brain: Examination degraded by motion artifact.

Generalized age-related cerebral atrophy. Extensive patchy and
confluent T2/FLAIR hyperintensity within the periventricular deep
white matter both cerebral hemispheres as well as the pons, most
consistent with chronic small vessel ischemic disease, fairly
advanced in nature. Multiple superimposed remote lacunar infarcts
seen about the bilateral basal ganglia, thalami, and pons. Chronic
left cerebellar infarct noted as well.

Approximate 2.5 cm linear focus of restricted diffusion seen
extending from the posterior right lentiform nucleus into the
posterior right caudate and corona radiata, consistent with an acute
ischemic perforator type infarct (series 5, image 73). Few
additional scattered ischemic infarcts seen involving the
subcortical white matter of the overlying right frontal lobe (w
series 5, image 83, 81). Additional subcentimeter acute ischemic
infarct noted within the subcortical right occipital lobe (series 5,
image 63). Approximate 1 cm acute ischemic infarcts seen involving
the inferior right cerebellum (series 5, image 53). No definite
associated hemorrhage about these infarcts. No associated mass
effect.

No mass lesion or midline shift. Mild ventricular prominence related
to global parenchymal volume loss without hydrocephalus. No
extra-axial fluid collection. Pituitary gland suprasellar region
within normal limits. Midline structures intact.

Vascular: Major intracranial vascular flow voids are maintained.

Skull and upper cervical spine: Craniocervical junction within
normal limits. Bone marrow signal intensity normal. No scalp soft
tissue abnormality.

Sinuses/Orbits: Globes and orbital soft tissues within normal
limits. Mild scattered mucosal thickening noted throughout the
paranasal sinuses. No mastoid effusion. Inner ear structures normal.

Other: None.
IMPRESSION: 1. Multiple scattered acute ischemic infarcts involving the
posterior right basal ganglia, right cerebral hemisphere, and right
cerebellum as above. No associated hemorrhage or mass effect.
2. Underlying atrophy with advanced chronic microvascular ischemic
disease, with multiple remote lacunar infarcts about the bilateral
basal ganglia, thalami, and pons, with additional chronic left
cerebellar infarct.

## 2019-07-01 MED ORDER — SODIUM CHLORIDE 0.9% FLUSH
9.0000 mL | Freq: Once | INTRAVENOUS | Status: AC
  Administered 2019-07-01: 9 mL via INTRAVENOUS

## 2019-07-01 MED ORDER — FOLIC ACID 5 MG/ML IJ SOLN
1.0000 mg | Freq: Every day | INTRAMUSCULAR | Status: DC
  Administered 2019-07-01 – 2019-07-07 (×7): 1 mg via INTRAVENOUS
  Filled 2019-07-01 (×8): qty 0.2

## 2019-07-01 MED ORDER — CLOPIDOGREL BISULFATE 75 MG PO TABS
75.0000 mg | ORAL_TABLET | Freq: Every day | ORAL | Status: DC
  Administered 2019-07-01 – 2019-07-07 (×7): 75 mg via ORAL
  Filled 2019-07-01 (×7): qty 1

## 2019-07-01 MED ORDER — ATORVASTATIN CALCIUM 40 MG PO TABS
40.0000 mg | ORAL_TABLET | Freq: Every day | ORAL | Status: DC
  Administered 2019-07-01 – 2019-07-07 (×7): 40 mg via ORAL
  Filled 2019-07-01 (×7): qty 1

## 2019-07-01 NOTE — Progress Notes (Signed)
Echocardiogram 2D Echocardiogram has been performed.  Russell Russell 07/01/2019, 2:00 PM

## 2019-07-01 NOTE — Progress Notes (Signed)
Right carotid artery duplex completed. Refer to "CV Proc" under chart review to view preliminary results.  07/01/2019 12:44 PM Kelby Aline., MHA, RVT, RDCS, RDMS

## 2019-07-01 NOTE — Progress Notes (Signed)
STROKE TEAM PROGRESS NOTE   INTERVAL HISTORY Wife and RN are at bedside. Pt has significant dysarthria and left sided weakness. MRI showed multifocal right hemisphere infarcts and right cerebellar small infarcts. Embolic pattern. However, CTA head and neck as well as CUS concerning for right ICA bulb questionable filling defect.   Vitals:   07/01/19 0730 07/01/19 0745 07/01/19 0800 07/01/19 0830  BP: (!) 163/126 (!) 154/120 (!) 161/124 (!) 181/121  Pulse: 82 81 95 91  Resp: 17 15 16 19   SpO2: 98% 98% 98% 99%  Weight:        CBC:  Recent Labs  Lab 06/30/19 1919 06/30/19 1919 06/30/19 1926 07/01/19 0315  WBC 4.6  --   --  3.4*  NEUTROABS 3.4  --   --   --   HGB 19.2*   < > 20.1* 18.0*  HCT 54.7*   < > 59.0* 52.3*  MCV 91.8  --   --  92.2  PLT 190  --   --  216   < > = values in this interval not displayed.    Basic Metabolic Panel:  Recent Labs  Lab 06/30/19 1919 06/30/19 1919 06/30/19 1926 07/01/19 0315  NA 136   < > 137 138  K 4.0   < > 3.8 3.5  CL 101   < > 101 102  CO2 23  --   --  19*  GLUCOSE 134*   < > 132* 133*  BUN 7*   < > 8 6*  CREATININE 1.16   < > 0.90 0.98  CALCIUM 9.4  --   --  9.1  MG  --   --   --  1.7   < > = values in this interval not displayed.   Lipid Panel:     Component Value Date/Time   CHOL 196 07/01/2019 0315   TRIG 52 07/01/2019 0315   HDL 77 07/01/2019 0315   CHOLHDL 2.5 07/01/2019 0315   VLDL 10 07/01/2019 0315   LDLCALC 109 (H) 07/01/2019 0315   HgbA1c:  Lab Results  Component Value Date   HGBA1C 6.2 (H) 07/01/2019   Urine Drug Screen:     Component Value Date/Time   LABOPIA NONE DETECTED 07/01/2019 0400   COCAINSCRNUR NONE DETECTED 07/01/2019 0400   LABBENZ NONE DETECTED 07/01/2019 0400   AMPHETMU NONE DETECTED 07/01/2019 0400   THCU NONE DETECTED 07/01/2019 0400   LABBARB NONE DETECTED 07/01/2019 0400    Alcohol Level     Component Value Date/Time   ETH 18 (H) 06/30/2019 1924    IMAGING past 24 hours CT  Code Stroke CTA Head W/WO contrast  Addendum Date: 07/01/2019   ADDENDUM REPORT: 07/01/2019 09:49 ADDENDUM: Study discussed by telephone with Dr. Erlinda Hong on 07/01/2019 at 0930 hours. There is subtle heterogeneity at the lateral right ICA bulb (series 4, image 108) suggesting a small, indistinct filling defect in the vessel. Perhaps this is a small carotid web, or less likely adherent thrombus, with some flow related motion. We discussed that I think Carotid Doppler Ultrasound focused on the Right ICA bulb would best evaluate these possibilities. Electronically Signed   By: Genevie Ann M.D.   On: 07/01/2019 09:49   Result Date: 07/01/2019 CLINICAL DATA:  Left-sided weakness EXAM: CT ANGIOGRAPHY HEAD AND NECK CT PERFUSION BRAIN TECHNIQUE: Multidetector CT imaging of the head and neck was performed using the standard protocol during bolus administration of intravenous contrast. Multiplanar CT image reconstructions and MIPs were obtained to  evaluate the vascular anatomy. Carotid stenosis measurements (when applicable) are obtained utilizing NASCET criteria, using the distal internal carotid diameter as the denominator. Multiphase CT imaging of the brain was performed following IV bolus contrast injection. Subsequent parametric perfusion maps were calculated using RAPID software. CONTRAST:  131mL OMNIPAQUE IOHEXOL 350 MG/ML SOLN COMPARISON:  None. FINDINGS: CTA NECK FINDINGS SKELETON: There is no bony spinal canal stenosis. No lytic or blastic lesion. OTHER NECK: Normal pharynx, larynx and major salivary glands. No cervical lymphadenopathy. Unremarkable thyroid gland. UPPER CHEST: No pneumothorax or pleural effusion. No nodules or masses. AORTIC ARCH: There is mild calcific atherosclerosis of the aortic arch. 3.6 cm diameter of descending thoracic aorta arch. Conventional 3 vessel aortic branching pattern. The visualized proximal subclavian arteries are widely patent. RIGHT CAROTID SYSTEM: Normal without aneurysm, dissection  or stenosis. LEFT CAROTID SYSTEM: Normal without aneurysm, dissection or stenosis. VERTEBRAL ARTERIES: Right dominant configuration. Both origins are clearly patent. There is no dissection, occlusion or flow-limiting stenosis to the skull base (V1-V3 segments). CTA HEAD FINDINGS POSTERIOR CIRCULATION: --Vertebral arteries: Normal V4 segments. --Posterior inferior cerebellar arteries (PICA): Patent origins from the vertebral arteries. --Anterior inferior cerebellar arteries (AICA): Patent origins from the basilar artery. --Basilar artery: Normal. --Superior cerebellar arteries: Normal. --Posterior cerebral arteries: Normal. Both originate from the basilar artery. Posterior communicating arteries (p-comm) are diminutive or absent. ANTERIOR CIRCULATION: --Intracranial internal carotid arteries: Normal. --Anterior cerebral arteries (ACA): Normal. Both A1 segments are present. Patent anterior communicating artery (a-comm). --Middle cerebral arteries (MCA): Normal. VENOUS SINUSES: As permitted by contrast timing, patent. ANATOMIC VARIANTS: None Review of the MIP images confirms the above findings. CT Brain Perfusion Findings: ASPECTS: 10 CBF (<30%) Volume: 63mL Perfusion (Tmax>6.0s) volume: 51mL Mismatch Volume: 38mL Infarction Location:Areas of elevated Tmax are located in the right frontal periventricular white matter and left cerebellum. These regions correspond to areas of hypoattenuation on the noncontrast head CT, including an old left cerebellar infarct. IMPRESSION: 1. No emergent large vessel occlusion or high-grade stenosis of the intracranial arteries. 2. Area of ischemia identified in the right frontal periventricular white matter and left cerebellum, corresponding to the areas of hypoattenuation on the noncontrast head CT and possibly artifact/pseudonormalization. No core infarct by CT perfusion analysis. 3. Aortic Atherosclerosis (ICD10-I70.0). 4. 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 Recommend annual imaging followup  by CTA or MRA. This recommendation follows 2010 ACCF/AHA/AATS/ACR/ASA/SCA/SCAI/SIR/STS/SVM Guidelines for the Diagnosis and Management of Patients with Thoracic Aortic Disease. Circulation.2010; [id number]. Aortic aneurysm NOS (ICD10-I71.9) Electronically Signed: By: Ulyses Jarred M.D. On: 06/30/2019 20:03   CT Code Stroke CTA Neck W/WO contrast  Addendum Date: 07/01/2019   ADDENDUM REPORT: 07/01/2019 09:49 ADDENDUM: Study discussed by telephone with Dr. Erlinda Hong on 07/01/2019 at 0930 hours. There is subtle heterogeneity at the lateral right ICA bulb (series 4, image 108) suggesting a small, indistinct filling defect in the vessel. Perhaps this is a small carotid web, or less likely adherent thrombus, with some flow related motion. We discussed that I think Carotid Doppler Ultrasound focused on the Right ICA bulb would best evaluate these possibilities. Electronically Signed   By: Genevie Ann M.D.   On: 07/01/2019 09:49   Result Date: 07/01/2019 CLINICAL DATA:  Left-sided weakness EXAM: CT ANGIOGRAPHY HEAD AND NECK CT PERFUSION BRAIN TECHNIQUE: Multidetector CT imaging of the head and neck was performed using the standard protocol during bolus administration of intravenous contrast. Multiplanar CT image reconstructions and MIPs were obtained to evaluate the vascular anatomy. Carotid stenosis measurements (when applicable) are obtained utilizing  NASCET criteria, using the distal internal carotid diameter as the denominator. Multiphase CT imaging of the brain was performed following IV bolus contrast injection. Subsequent parametric perfusion maps were calculated using RAPID software. CONTRAST:  16mL OMNIPAQUE IOHEXOL 350 MG/ML SOLN COMPARISON:  None. FINDINGS: CTA NECK FINDINGS SKELETON: There is no bony spinal canal stenosis. No lytic or blastic lesion. OTHER NECK: Normal pharynx, larynx and major salivary glands. No cervical lymphadenopathy. Unremarkable thyroid gland. UPPER CHEST: No pneumothorax or pleural  effusion. No nodules or masses. AORTIC ARCH: There is mild calcific atherosclerosis of the aortic arch. 3.6 cm diameter of descending thoracic aorta arch. Conventional 3 vessel aortic branching pattern. The visualized proximal subclavian arteries are widely patent. RIGHT CAROTID SYSTEM: Normal without aneurysm, dissection or stenosis. LEFT CAROTID SYSTEM: Normal without aneurysm, dissection or stenosis. VERTEBRAL ARTERIES: Right dominant configuration. Both origins are clearly patent. There is no dissection, occlusion or flow-limiting stenosis to the skull base (V1-V3 segments). CTA HEAD FINDINGS POSTERIOR CIRCULATION: --Vertebral arteries: Normal V4 segments. --Posterior inferior cerebellar arteries (PICA): Patent origins from the vertebral arteries. --Anterior inferior cerebellar arteries (AICA): Patent origins from the basilar artery. --Basilar artery: Normal. --Superior cerebellar arteries: Normal. --Posterior cerebral arteries: Normal. Both originate from the basilar artery. Posterior communicating arteries (p-comm) are diminutive or absent. ANTERIOR CIRCULATION: --Intracranial internal carotid arteries: Normal. --Anterior cerebral arteries (ACA): Normal. Both A1 segments are present. Patent anterior communicating artery (a-comm). --Middle cerebral arteries (MCA): Normal. VENOUS SINUSES: As permitted by contrast timing, patent. ANATOMIC VARIANTS: None Review of the MIP images confirms the above findings. CT Brain Perfusion Findings: ASPECTS: 10 CBF (<30%) Volume: 53mL Perfusion (Tmax>6.0s) volume: 29mL Mismatch Volume: 23mL Infarction Location:Areas of elevated Tmax are located in the right frontal periventricular white matter and left cerebellum. These regions correspond to areas of hypoattenuation on the noncontrast head CT, including an old left cerebellar infarct. IMPRESSION: 1. No emergent large vessel occlusion or high-grade stenosis of the intracranial arteries. 2. Area of ischemia identified in the right  frontal periventricular white matter and left cerebellum, corresponding to the areas of hypoattenuation on the noncontrast head CT and possibly artifact/pseudonormalization. No core infarct by CT perfusion analysis. 3. Aortic Atherosclerosis (ICD10-I70.0). 4. 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 Recommend annual imaging followup by CTA or MRA. This recommendation follows 2010 ACCF/AHA/AATS/ACR/ASA/SCA/SCAI/SIR/STS/SVM Guidelines for the Diagnosis and Management of Patients with Thoracic Aortic Disease. Circulation.2010; [id number]. Aortic aneurysm NOS (ICD10-I71.9) Electronically Signed: By: Ulyses Jarred M.D. On: 06/30/2019 20:03   MR BRAIN WO CONTRAST  Result Date: 07/01/2019 CLINICAL DATA:  Follow-up examination for acute stroke. EXAM: MRI HEAD WITHOUT CONTRAST TECHNIQUE: Multiplanar, multiecho pulse sequences of the brain and surrounding structures were obtained without intravenous contrast. COMPARISON:  Prior CTs from 06/30/2019. FINDINGS: Brain: Examination degraded by motion artifact. Generalized age-related cerebral atrophy. Extensive patchy and confluent T2/FLAIR hyperintensity within the periventricular deep white matter both cerebral hemispheres as well as the pons, most consistent with chronic small vessel ischemic disease, fairly advanced in nature. Multiple superimposed remote lacunar infarcts seen about the bilateral basal ganglia, thalami, and pons. Chronic left cerebellar infarct noted as well. Approximate 2.5 cm linear focus of restricted diffusion seen extending from the posterior right lentiform nucleus into the posterior right caudate and corona radiata, consistent with an acute ischemic perforator type infarct (series 5, image 73). Few additional scattered ischemic infarcts seen involving the subcortical white matter of the overlying right frontal lobe (w series 5, image 83, 81). Additional subcentimeter acute ischemic infarct noted within the subcortical right  occipital lobe (series 5, image  63). Approximate 1 cm acute ischemic infarcts seen involving the inferior right cerebellum (series 5, image 53). No definite associated hemorrhage about these infarcts. No associated mass effect. No mass lesion or midline shift. Mild ventricular prominence related to global parenchymal volume loss without hydrocephalus. No extra-axial fluid collection. Pituitary gland suprasellar region within normal limits. Midline structures intact. Vascular: Major intracranial vascular flow voids are maintained. Skull and upper cervical spine: Craniocervical junction within normal limits. Bone marrow signal intensity normal. No scalp soft tissue abnormality. Sinuses/Orbits: Globes and orbital soft tissues within normal limits. Mild scattered mucosal thickening noted throughout the paranasal sinuses. No mastoid effusion. Inner ear structures normal. Other: None. IMPRESSION: 1. Multiple scattered acute ischemic infarcts involving the posterior right basal ganglia, right cerebral hemisphere, and right cerebellum as above. No associated hemorrhage or mass effect. 2. Underlying atrophy with advanced chronic microvascular ischemic disease, with multiple remote lacunar infarcts about the bilateral basal ganglia, thalami, and pons, with additional chronic left cerebellar infarct. Electronically Signed   By: Jeannine Boga M.D.   On: 07/01/2019 01:29   CT Code Stroke Cerebral Perfusion with contrast  Addendum Date: 07/01/2019   ADDENDUM REPORT: 07/01/2019 09:49 ADDENDUM: Study discussed by telephone with Dr. Erlinda Hong on 07/01/2019 at 0930 hours. There is subtle heterogeneity at the lateral right ICA bulb (series 4, image 108) suggesting a small, indistinct filling defect in the vessel. Perhaps this is a small carotid web, or less likely adherent thrombus, with some flow related motion. We discussed that I think Carotid Doppler Ultrasound focused on the Right ICA bulb would best evaluate these possibilities. Electronically Signed   By:  Genevie Ann M.D.   On: 07/01/2019 09:49   Result Date: 07/01/2019 CLINICAL DATA:  Left-sided weakness EXAM: CT ANGIOGRAPHY HEAD AND NECK CT PERFUSION BRAIN TECHNIQUE: Multidetector CT imaging of the head and neck was performed using the standard protocol during bolus administration of intravenous contrast. Multiplanar CT image reconstructions and MIPs were obtained to evaluate the vascular anatomy. Carotid stenosis measurements (when applicable) are obtained utilizing NASCET criteria, using the distal internal carotid diameter as the denominator. Multiphase CT imaging of the brain was performed following IV bolus contrast injection. Subsequent parametric perfusion maps were calculated using RAPID software. CONTRAST:  110mL OMNIPAQUE IOHEXOL 350 MG/ML SOLN COMPARISON:  None. FINDINGS: CTA NECK FINDINGS SKELETON: There is no bony spinal canal stenosis. No lytic or blastic lesion. OTHER NECK: Normal pharynx, larynx and major salivary glands. No cervical lymphadenopathy. Unremarkable thyroid gland. UPPER CHEST: No pneumothorax or pleural effusion. No nodules or masses. AORTIC ARCH: There is mild calcific atherosclerosis of the aortic arch. 3.6 cm diameter of descending thoracic aorta arch. Conventional 3 vessel aortic branching pattern. The visualized proximal subclavian arteries are widely patent. RIGHT CAROTID SYSTEM: Normal without aneurysm, dissection or stenosis. LEFT CAROTID SYSTEM: Normal without aneurysm, dissection or stenosis. VERTEBRAL ARTERIES: Right dominant configuration. Both origins are clearly patent. There is no dissection, occlusion or flow-limiting stenosis to the skull base (V1-V3 segments). CTA HEAD FINDINGS POSTERIOR CIRCULATION: --Vertebral arteries: Normal V4 segments. --Posterior inferior cerebellar arteries (PICA): Patent origins from the vertebral arteries. --Anterior inferior cerebellar arteries (AICA): Patent origins from the basilar artery. --Basilar artery: Normal. --Superior cerebellar  arteries: Normal. --Posterior cerebral arteries: Normal. Both originate from the basilar artery. Posterior communicating arteries (p-comm) are diminutive or absent. ANTERIOR CIRCULATION: --Intracranial internal carotid arteries: Normal. --Anterior cerebral arteries (ACA): Normal. Both A1 segments are present. Patent anterior communicating artery (a-comm). --Middle cerebral  arteries (MCA): Normal. VENOUS SINUSES: As permitted by contrast timing, patent. ANATOMIC VARIANTS: None Review of the MIP images confirms the above findings. CT Brain Perfusion Findings: ASPECTS: 10 CBF (<30%) Volume: 43mL Perfusion (Tmax>6.0s) volume: 53mL Mismatch Volume: 11mL Infarction Location:Areas of elevated Tmax are located in the right frontal periventricular white matter and left cerebellum. These regions correspond to areas of hypoattenuation on the noncontrast head CT, including an old left cerebellar infarct. IMPRESSION: 1. No emergent large vessel occlusion or high-grade stenosis of the intracranial arteries. 2. Area of ischemia identified in the right frontal periventricular white matter and left cerebellum, corresponding to the areas of hypoattenuation on the noncontrast head CT and possibly artifact/pseudonormalization. No core infarct by CT perfusion analysis. 3. Aortic Atherosclerosis (ICD10-I70.0). 4. 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 Recommend annual imaging followup by CTA or MRA. This recommendation follows 2010 ACCF/AHA/AATS/ACR/ASA/SCA/SCAI/SIR/STS/SVM Guidelines for the Diagnosis and Management of Patients with Thoracic Aortic Disease. Circulation.2010; [id number]. Aortic aneurysm NOS (ICD10-I71.9) Electronically Signed: By: Ulyses Jarred M.D. On: 06/30/2019 20:03   CT HEAD CODE STROKE WO CONTRAST  Result Date: 06/30/2019 CLINICAL DATA:  Code stroke.  Left-sided weakness EXAM: CT HEAD WITHOUT CONTRAST TECHNIQUE: Contiguous axial images were obtained from the base of the skull through the vertex without intravenous  contrast. COMPARISON:  None. FINDINGS: Brain: There is no mass, hemorrhage or extra-axial collection. There is generalized atrophy without lobar predilection. There is hypoattenuation of the periventricular white matter, most commonly indicating chronic ischemic microangiopathy. Old left basal ganglia small vessel infarct. Vascular: No abnormal hyperdensity of the major intracranial arteries or dural venous sinuses. No intracranial atherosclerosis. Skull: The visualized skull base, calvarium and extracranial soft tissues are normal. Sinuses/Orbits: No fluid levels or advanced mucosal thickening of the visualized paranasal sinuses. No mastoid or middle ear effusion. The orbits are normal. ASPECTS [facility] ([facility]) Stroke Program Early CT Score) - Ganglionic level infarction (caudate, lentiform nuclei, internal capsule, insula, M1-M3 cortex): 7 - Supraganglionic infarction (M4-M6 cortex): 3 Total score (0-10 with 10 being normal): 10 IMPRESSION: 1. Chronic ischemic microangiopathy and generalized atrophy without acute intracranial abnormality. 2. ASPECTS is 10. * These results were communicated to Dr. Kerney Elbe at 7:30 pm on 06/30/2019 by text page via the [facility] messaging system. Electronically Signed   By: Ulyses Jarred M.D.   On: 06/30/2019 19:30    PHYSICAL EXAM  Pulse Rate:  [73-114] 85 (04/21 1200) Resp:  [14-33] 21 (04/21 1200) BP: (146-186)/(106-158) 161/130 (04/21 1200) SpO2:  [91 %-100 %] 97 % (04/21 1200) Weight:  [68.5 kg] 68.5 kg (04/20 2000)  General - Well nourished, well developed, lethargic.  Ophthalmologic - fundi not visualized due to noncooperation.  Cardiovascular - Regular rhythm and rate.  Neuro - awake but lethargic, orientated to place, time and people but not to age. No aphasia, able to name and repeat but severe dysarthria and paucity of speech. PERRL, EOMI, visual field full, left facial droop, tongue midline. Left UE flaccid, left LE 1/5, both mild withdraw to pain. RUE at least  4/5 and RLE at least 3/5. Sensation symmetrical on testing. Right FTN intact but action tremor of right hand. Gait not tested.   ASSESSMENT/PLAN [name] is a 69 y.o. male with history of HTN, HLD, tobacco and alcohol abuse presenting with L sided weakness.   Stroke:   Multiple scattered anterior and posterior R brain infarcts embolic secondary to unclear source  Code Stroke CT head No acute abnormality. Chronic Small vessel disease. Atrophy. ASPECTS 10.  CTA head & neck no ELVO. Aortic atherosclerosis. 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  Subtle R ICA bulb indistinct filling defect possible small carotid web vs less likely thrombus.  CT perfusion R frontal periventricular white matter and L cerebellar artifact/pseudonormalization. No core infarct.   MRI  Multiple scattered posterior R basal ganglia, R cerebral, R cerebellar infarcts.  Small vessel disease. Atrophy. Multiple old lacunes B basal ganglia, thalami, pons and L cerebellar infarct   Carotid Doppler pending  2D Echo pending  LE venous doppler pending  May consider loop recorder to rule out afib  LDL 109  HgbA1c 6.2  Lovenox 40 mg sq daily for VTE prophylaxis  No antithrombotic prior to admission, now on aspirin 325 mg daily and clopidogrel 75 mg daily.   Therapy recommendations:  pending   Disposition:  pending   Hypertensive Urgency Hypertension  BP as high as 186/125 on arrival  BP remains elevated . Permissive hypertension (OK if < 220/120) but gradually normalize in 2-3 days . Long-term BP goal normotensive  Hyperlipidemia  Home meds:  lipitor 40 (not compliant), resumed in hospital  LDL 109, goal < 70  Continue statin at discharge  Right carotid filling defect ?  CTA head and neck concerning for subtle right carotid filling defect  CUS pending  On DAPT now, if thrombus confirmed, will consider anticoagulation  Alcohol abuse  As per wife, pt drinks "a lot"  On CIWA protocol  On  FA/B1/MVI  Alcohol withdraw precautions  advised to drink no more than 2 drink(s) a day  Tobacco abuse  Current smoker, 1/2PPD  Smoking cessation counseling provided  Pt is willing to quit  Other Stroke Risk Factors  Advanced age  Other Active Problems  Polycythemia HCT 19.2-20.1-18.0 - smoker  Hospital day # 1  Rosalin Hawking, MD PhD Stroke Neurology 07/01/2019 1:38 PM  To contact Stroke Continuity provider, please refer to [email or web address] After hours, contact General Neurology

## 2019-07-01 NOTE — ED Notes (Signed)
Vital signs stable.Patient denies pain and is resting comfortably.

## 2019-07-01 NOTE — ED Notes (Signed)
Patient transported to MRI 

## 2019-07-01 NOTE — Progress Notes (Signed)
PT Cancellation Note  Patient Details Name: Russell Russell MRN: [id number] DOB: 11-27-1950   Cancelled Treatment:    Reason Eval/Treat Not Completed: Medical issues which prohibited therapy Pt with elevated BP and RN requesting to hold. Will follow up as pt medically appropriate and follow up as schedule allows.   Reuel Derby, PT, DPT  Acute Rehabilitation Services  Pager: [phone] Office: [phone]    Rudean Hitt 07/01/2019, 11:04 AM

## 2019-07-01 NOTE — Progress Notes (Signed)
PROGRESS NOTE                                                                                                                                                                                                             Patient Demographics:    Russell Russell, is a 69 y.o. male, DOB - [birth year], [id number]  Admit date - 06/30/2019   Admitting Physician Dwyane Dee, MD  Outpatient Primary MD for the patient is Inda Coke, [place]  LOS - 1   Chief Complaint  Patient presents with  . Code Stroke       Brief Narrative    Russell Russell is a 69 y.o. male with history of HTN, HLD, tobacco and alcohol abuse presenting with L sided weakness   Subjective:    Russell Russell today has, No headache, No chest pain, No abdominal pain , reports no  improvement of his left-sided weakness.   Assessment  & Plan :    Active Problems:   Stroke [facility])   Acute CVA: -Neurology input greatly appreciated, evidence of multiple scattered anterior and posterior right brain infarct, likely embolic, secondary to unclear source. - CTA head & neck no ELVO. Aortic atherosclerosis. 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  Subtle R ICA bulb indistinct filling defect possible small carotid web vs less likely thrombus. - CT perfusion R frontal periventricular white matter and L cerebellar artifact/pseudonormalization. No core infarct.  - MRI  Multiple scattered posterior R basal ganglia, R cerebral, R cerebellar infarcts.  Small vessel disease. Atrophy. Multiple old lacunes B basal ganglia, thalami, pons and L cerebellar infarct  -Follow on 2D echo, carotid Doppler, lower extremity venous Dopplers. -Per neuro may require loop recorder to rule out A. Fib, meanwhile continue on telemetry monitor. -Currently on aspirin and Plavix 75 mg oral daily -PT/OT/SLP consulted.  COVID-19 Labs  No results for input(s): DDIMER, FERRITIN, LDH, CRP in the last 72 hours.  Lab Results    Component Value Date   [place] NEGATIVE 06/30/2019   Hypertension/hypertensive urgency -Allow for permissive hypertension  Hyperlipidemia -Should be on Lipitor 40 mg oral daily, but he is noncompliant, LDL is 109, goal is less than 70.  Patient will right carotid filling defect -Carotid ultrasound is pending  Alcohol abuse -On folic acid, B1 and multivitamin, on CIWA protocol  Tobacco abuse -He was counseled  Polycythemia HCT 19.2-20.1-18.0 - smoker  Code Status : Full  Family Communication  : wife at bedsdie  Disposition Plan  :  Status is: Inpatient   Dispo: The patient is from: Home              Anticipated d/c is to: SNF              Anticipated d/c date is: 3 days              Patient currently is not medically stable to d/c.  Consults  :  neurology  Procedures  : None  DVT Prophylaxis  :  Lovenox  Lab Results  Component Value Date   PLT 216 07/01/2019    Antibiotics  :    Anti-infectives (From admission, onward)   None        Objective:   Vitals:   07/01/19 1130 07/01/19 1200 07/01/19 1230 07/01/19 1423  BP: (!) 158/127 (!) 161/130 (!) 161/120 (!) 146/112  Pulse: 91 85 91 94  Resp: (!) 22 (!) 21 (!) 21 20  Temp:    98.7 F (37.1 C)  TempSrc:    Oral  SpO2: 97% 97% 98% 97%  Weight:        Wt Readings from Last 3 Encounters:  06/30/19 68.5 kg  04/24/19 65.4 kg  03/24/19 66.9 kg    No intake or output data in the 24 hours ending 07/01/19 1433   Physical Exam  Somnolent, but wakes up and answer questions appropriately , severe dysarthria . Symmetrical Chest wall movement, Good air movement bilaterally, CTAB RRR,No Gallops,Rubs or new Murmurs, No Parasternal Heave +ve B.Sounds, Abd Soft, No tenderness,, No rebound - guarding or rigidity. No Cyanosis, Clubbing or edema, dense left-sided weakness.    Data Review:    CBC Recent Labs  Lab 06/30/19 1919 06/30/19 1926 07/01/19 0315  WBC 4.6  --  3.4*  HGB 19.2* 20.1* 18.0*   HCT 54.7* 59.0* 52.3*  PLT 190  --  216  MCV 91.8  --  92.2  MCH 32.2  --  31.7  MCHC 35.1  --  34.4  RDW 16.0*  --  15.8*  LYMPHSABS 0.7  --   --   MONOABS 0.5  --   --   EOSABS 0.0  --   --   BASOSABS 0.0  --   --     Chemistries  Recent Labs  Lab 06/30/19 1919 06/30/19 1926 07/01/19 0315  NA 136 137 138  K 4.0 3.8 3.5  CL 101 101 102  CO2 23  --  19*  GLUCOSE 134* 132* 133*  BUN 7* 8 6*  CREATININE 1.16 0.90 0.98  CALCIUM 9.4  --  9.1  MG  --   --  1.7  AST 53*  --   --   ALT 29  --   --   ALKPHOS 100  --   --   BILITOT 1.4*  --   --    ------------------------------------------------------------------------------------------------------------------ Recent Labs    07/01/19 0315  CHOL 196  HDL 77  LDLCALC 109*  TRIG 52  CHOLHDL 2.5    Lab Results  Component Value Date   HGBA1C 6.2 (H) 07/01/2019   ------------------------------------------------------------------------------------------------------------------ Recent Labs    07/01/19 0315  TSH 2.209   ------------------------------------------------------------------------------------------------------------------ Recent Labs    07/01/19 0315  VITAMINB12 383  FOLATE 5.6*    Coagulation profile Recent Labs  Lab 06/30/19 1955  INR 1.0  No results for input(s): DDIMER in the last 72 hours.  Cardiac Enzymes No results for input(s): CKMB, TROPONINI, MYOGLOBIN in the last 168 hours.  Invalid input(s): CK ------------------------------------------------------------------------------------------------------------------ No results found for: BNP  Inpatient Medications  Scheduled Meds: . aspirin  300 mg Rectal Daily   Or  . aspirin  325 mg Oral Daily  . atorvastatin  40 mg Oral Daily  . clopidogrel  75 mg Oral Daily  . enoxaparin (LOVENOX) injection  40 mg Subcutaneous Q24H  . folic acid  1 mg Intravenous Daily  . LORazepam  0-4 mg Intravenous Q6H   Followed by  . [START ON  07/02/2019] LORazepam  0-4 mg Intravenous Q12H  . multivitamin with minerals  1 tablet Oral Daily  . thiamine  100 mg Oral Daily   Or  . thiamine  100 mg Intravenous Daily   Continuous Infusions: . sodium chloride 75 mL/hr at 06/30/19 2352   PRN Meds:.acetaminophen **OR** acetaminophen (TYLENOL) oral liquid 160 mg/5 mL **OR** acetaminophen, labetalol, LORazepam **OR** LORazepam, senna-docusate  Micro Results Recent Results (from the past 240 hour(s))  SARS CORONAVIRUS 2 (TAT 6-24 HRS) Nasopharyngeal Nasopharyngeal Swab     Status: None   Collection Time: 06/30/19  9:58 PM   Specimen: Nasopharyngeal Swab  Result Value Ref Range Status   SARS Coronavirus 2 NEGATIVE NEGATIVE Final    Comment: (NOTE) SARS-CoV-2 target nucleic acids are NOT DETECTED. The SARS-CoV-2 RNA is generally detectable in upper and lower respiratory specimens during the acute phase of infection. Negative results do not preclude SARS-CoV-2 infection, do not rule out co-infections with other pathogens, and should not be used as the sole basis for treatment or other patient management decisions. Negative results must be combined with clinical observations, patient history, and epidemiological information. The expected result is Negative. Fact Sheet for Patients: SugarRoll.be Fact Sheet for Healthcare Providers: https://www.woods-mathews.com/ This test is not yet approved or cleared by the [place] FDA and  has been authorized for detection and/or diagnosis of SARS-CoV-2 by FDA under an Emergency Use Authorization (EUA). This EUA will remain  in effect (meaning this test can be used) for the duration of the COVID-19 declaration under Section 56 4(b)(1) of the Act, 21 U.S.C. section 360bbb-3(b)(1), unless the authorization is terminated or revoked sooner. Performed at [facility], [place] [address] [place], [place] [address]     Radiology Reports CT Code  Stroke CTA Head W/WO contrast  Addendum Date: 07/01/2019   ADDENDUM REPORT: 07/01/2019 09:49 ADDENDUM: Study discussed by telephone with Dr. Erlinda Hong on 07/01/2019 at 0930 hours. There is subtle heterogeneity at the lateral right ICA bulb (series 4, image 108) suggesting a small, indistinct filling defect in the vessel. Perhaps this is a small carotid web, or less likely adherent thrombus, with some flow related motion. We discussed that I think Carotid Doppler Ultrasound focused on the Right ICA bulb would best evaluate these possibilities. Electronically Signed   By: Genevie Ann M.D.   On: 07/01/2019 09:49   Result Date: 07/01/2019 CLINICAL DATA:  Left-sided weakness EXAM: CT ANGIOGRAPHY HEAD AND NECK CT PERFUSION BRAIN TECHNIQUE: Multidetector CT imaging of the head and neck was performed using the standard protocol during bolus administration of intravenous contrast. Multiplanar CT image reconstructions and MIPs were obtained to evaluate the vascular anatomy. Carotid stenosis measurements (when applicable) are obtained utilizing NASCET criteria, using the distal internal carotid diameter as the denominator. Multiphase CT imaging of the brain was performed following IV bolus contrast injection.  Subsequent parametric perfusion maps were calculated using RAPID software. CONTRAST:  158mL OMNIPAQUE IOHEXOL 350 MG/ML SOLN COMPARISON:  None. FINDINGS: CTA NECK FINDINGS SKELETON: There is no bony spinal canal stenosis. No lytic or blastic lesion. OTHER NECK: Normal pharynx, larynx and major salivary glands. No cervical lymphadenopathy. Unremarkable thyroid gland. UPPER CHEST: No pneumothorax or pleural effusion. No nodules or masses. AORTIC ARCH: There is mild calcific atherosclerosis of the aortic arch. 3.6 cm diameter of descending thoracic aorta arch. Conventional 3 vessel aortic branching pattern. The visualized proximal subclavian arteries are widely patent. RIGHT CAROTID SYSTEM: Normal without aneurysm, dissection or  stenosis. LEFT CAROTID SYSTEM: Normal without aneurysm, dissection or stenosis. VERTEBRAL ARTERIES: Right dominant configuration. Both origins are clearly patent. There is no dissection, occlusion or flow-limiting stenosis to the skull base (V1-V3 segments). CTA HEAD FINDINGS POSTERIOR CIRCULATION: --Vertebral arteries: Normal V4 segments. --Posterior inferior cerebellar arteries (PICA): Patent origins from the vertebral arteries. --Anterior inferior cerebellar arteries (AICA): Patent origins from the basilar artery. --Basilar artery: Normal. --Superior cerebellar arteries: Normal. --Posterior cerebral arteries: Normal. Both originate from the basilar artery. Posterior communicating arteries (p-comm) are diminutive or absent. ANTERIOR CIRCULATION: --Intracranial internal carotid arteries: Normal. --Anterior cerebral arteries (ACA): Normal. Both A1 segments are present. Patent anterior communicating artery (a-comm). --Middle cerebral arteries (MCA): Normal. VENOUS SINUSES: As permitted by contrast timing, patent. ANATOMIC VARIANTS: None Review of the MIP images confirms the above findings. CT Brain Perfusion Findings: ASPECTS: 10 CBF (<30%) Volume: 60mL Perfusion (Tmax>6.0s) volume: 81mL Mismatch Volume: 63mL Infarction Location:Areas of elevated Tmax are located in the right frontal periventricular white matter and left cerebellum. These regions correspond to areas of hypoattenuation on the noncontrast head CT, including an old left cerebellar infarct. IMPRESSION: 1. No emergent large vessel occlusion or high-grade stenosis of the intracranial arteries. 2. Area of ischemia identified in the right frontal periventricular white matter and left cerebellum, corresponding to the areas of hypoattenuation on the noncontrast head CT and possibly artifact/pseudonormalization. No core infarct by CT perfusion analysis. 3. Aortic Atherosclerosis (ICD10-I70.0). 4. 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 Recommend annual imaging followup by  CTA or MRA. This recommendation follows 2010 ACCF/AHA/AATS/ACR/ASA/SCA/SCAI/SIR/STS/SVM Guidelines for the Diagnosis and Management of Patients with Thoracic Aortic Disease. Circulation.2010; 121JN:[id number]. Aortic aneurysm NOS (ICD10-I71.9) Electronically Signed: By: Ulyses Jarred M.D. On: 06/30/2019 20:03   CT Code Stroke CTA Neck W/WO contrast  Addendum Date: 07/01/2019   ADDENDUM REPORT: 07/01/2019 09:49 ADDENDUM: Study discussed by telephone with Dr. Erlinda Hong on 07/01/2019 at 0930 hours. There is subtle heterogeneity at the lateral right ICA bulb (series 4, image 108) suggesting a small, indistinct filling defect in the vessel. Perhaps this is a small carotid web, or less likely adherent thrombus, with some flow related motion. We discussed that I think Carotid Doppler Ultrasound focused on the Right ICA bulb would best evaluate these possibilities. Electronically Signed   By: Genevie Ann M.D.   On: 07/01/2019 09:49   Result Date: 07/01/2019 CLINICAL DATA:  Left-sided weakness EXAM: CT ANGIOGRAPHY HEAD AND NECK CT PERFUSION BRAIN TECHNIQUE: Multidetector CT imaging of the head and neck was performed using the standard protocol during bolus administration of intravenous contrast. Multiplanar CT image reconstructions and MIPs were obtained to evaluate the vascular anatomy. Carotid stenosis measurements (when applicable) are obtained utilizing NASCET criteria, using the distal internal carotid diameter as the denominator. Multiphase CT imaging of the brain was performed following IV bolus contrast injection. Subsequent parametric perfusion maps were calculated using RAPID software. CONTRAST:  161mL  OMNIPAQUE IOHEXOL 350 MG/ML SOLN COMPARISON:  None. FINDINGS: CTA NECK FINDINGS SKELETON: There is no bony spinal canal stenosis. No lytic or blastic lesion. OTHER NECK: Normal pharynx, larynx and major salivary glands. No cervical lymphadenopathy. Unremarkable thyroid gland. UPPER CHEST: No pneumothorax or pleural effusion.  No nodules or masses. AORTIC ARCH: There is mild calcific atherosclerosis of the aortic arch. 3.6 cm diameter of descending thoracic aorta arch. Conventional 3 vessel aortic branching pattern. The visualized proximal subclavian arteries are widely patent. RIGHT CAROTID SYSTEM: Normal without aneurysm, dissection or stenosis. LEFT CAROTID SYSTEM: Normal without aneurysm, dissection or stenosis. VERTEBRAL ARTERIES: Right dominant configuration. Both origins are clearly patent. There is no dissection, occlusion or flow-limiting stenosis to the skull base (V1-V3 segments). CTA HEAD FINDINGS POSTERIOR CIRCULATION: --Vertebral arteries: Normal V4 segments. --Posterior inferior cerebellar arteries (PICA): Patent origins from the vertebral arteries. --Anterior inferior cerebellar arteries (AICA): Patent origins from the basilar artery. --Basilar artery: Normal. --Superior cerebellar arteries: Normal. --Posterior cerebral arteries: Normal. Both originate from the basilar artery. Posterior communicating arteries (p-comm) are diminutive or absent. ANTERIOR CIRCULATION: --Intracranial internal carotid arteries: Normal. --Anterior cerebral arteries (ACA): Normal. Both A1 segments are present. Patent anterior communicating artery (a-comm). --Middle cerebral arteries (MCA): Normal. VENOUS SINUSES: As permitted by contrast timing, patent. ANATOMIC VARIANTS: None Review of the MIP images confirms the above findings. CT Brain Perfusion Findings: ASPECTS: 10 CBF (<30%) Volume: 58mL Perfusion (Tmax>6.0s) volume: 89mL Mismatch Volume: 42mL Infarction Location:Areas of elevated Tmax are located in the right frontal periventricular white matter and left cerebellum. These regions correspond to areas of hypoattenuation on the noncontrast head CT, including an old left cerebellar infarct. IMPRESSION: 1. No emergent large vessel occlusion or high-grade stenosis of the intracranial arteries. 2. Area of ischemia identified in the right frontal  periventricular white matter and left cerebellum, corresponding to the areas of hypoattenuation on the noncontrast head CT and possibly artifact/pseudonormalization. No core infarct by CT perfusion analysis. 3. Aortic Atherosclerosis (ICD10-I70.0). 4. 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 Recommend annual imaging followup by CTA or MRA. This recommendation follows 2010 ACCF/AHA/AATS/ACR/ASA/SCA/SCAI/SIR/STS/SVM Guidelines for the Diagnosis and Management of Patients with Thoracic Aortic Disease. Circulation.2010; [id number]. Aortic aneurysm NOS (ICD10-I71.9) Electronically Signed: By: Ulyses Jarred M.D. On: 06/30/2019 20:03   MR BRAIN WO CONTRAST  Result Date: 07/01/2019 CLINICAL DATA:  Follow-up examination for acute stroke. EXAM: MRI HEAD WITHOUT CONTRAST TECHNIQUE: Multiplanar, multiecho pulse sequences of the brain and surrounding structures were obtained without intravenous contrast. COMPARISON:  Prior CTs from 06/30/2019. FINDINGS: Brain: Examination degraded by motion artifact. Generalized age-related cerebral atrophy. Extensive patchy and confluent T2/FLAIR hyperintensity within the periventricular deep white matter both cerebral hemispheres as well as the pons, most consistent with chronic small vessel ischemic disease, fairly advanced in nature. Multiple superimposed remote lacunar infarcts seen about the bilateral basal ganglia, thalami, and pons. Chronic left cerebellar infarct noted as well. Approximate 2.5 cm linear focus of restricted diffusion seen extending from the posterior right lentiform nucleus into the posterior right caudate and corona radiata, consistent with an acute ischemic perforator type infarct (series 5, image 73). Few additional scattered ischemic infarcts seen involving the subcortical white matter of the overlying right frontal lobe (w series 5, image 83, 81). Additional subcentimeter acute ischemic infarct noted within the subcortical right occipital lobe (series 5, image 63).  Approximate 1 cm acute ischemic infarcts seen involving the inferior right cerebellum (series 5, image 53). No definite associated hemorrhage about these infarcts. No associated mass effect. No mass  lesion or midline shift. Mild ventricular prominence related to global parenchymal volume loss without hydrocephalus. No extra-axial fluid collection. Pituitary gland suprasellar region within normal limits. Midline structures intact. Vascular: Major intracranial vascular flow voids are maintained. Skull and upper cervical spine: Craniocervical junction within normal limits. Bone marrow signal intensity normal. No scalp soft tissue abnormality. Sinuses/Orbits: Globes and orbital soft tissues within normal limits. Mild scattered mucosal thickening noted throughout the paranasal sinuses. No mastoid effusion. Inner ear structures normal. Other: None. IMPRESSION: 1. Multiple scattered acute ischemic infarcts involving the posterior right basal ganglia, right cerebral hemisphere, and right cerebellum as above. No associated hemorrhage or mass effect. 2. Underlying atrophy with advanced chronic microvascular ischemic disease, with multiple remote lacunar infarcts about the bilateral basal ganglia, thalami, and pons, with additional chronic left cerebellar infarct. Electronically Signed   By: Jeannine Boga M.D.   On: 07/01/2019 01:29   CT Code Stroke Cerebral Perfusion with contrast  Addendum Date: 07/01/2019   ADDENDUM REPORT: 07/01/2019 09:49 ADDENDUM: Study discussed by telephone with Dr. Erlinda Hong on 07/01/2019 at 0930 hours. There is subtle heterogeneity at the lateral right ICA bulb (series 4, image 108) suggesting a small, indistinct filling defect in the vessel. Perhaps this is a small carotid web, or less likely adherent thrombus, with some flow related motion. We discussed that I think Carotid Doppler Ultrasound focused on the Right ICA bulb would best evaluate these possibilities. Electronically Signed   By: Genevie Ann M.D.   On: 07/01/2019 09:49   Result Date: 07/01/2019 CLINICAL DATA:  Left-sided weakness EXAM: CT ANGIOGRAPHY HEAD AND NECK CT PERFUSION BRAIN TECHNIQUE: Multidetector CT imaging of the head and neck was performed using the standard protocol during bolus administration of intravenous contrast. Multiplanar CT image reconstructions and MIPs were obtained to evaluate the vascular anatomy. Carotid stenosis measurements (when applicable) are obtained utilizing NASCET criteria, using the distal internal carotid diameter as the denominator. Multiphase CT imaging of the brain was performed following IV bolus contrast injection. Subsequent parametric perfusion maps were calculated using RAPID software. CONTRAST:  158mL OMNIPAQUE IOHEXOL 350 MG/ML SOLN COMPARISON:  None. FINDINGS: CTA NECK FINDINGS SKELETON: There is no bony spinal canal stenosis. No lytic or blastic lesion. OTHER NECK: Normal pharynx, larynx and major salivary glands. No cervical lymphadenopathy. Unremarkable thyroid gland. UPPER CHEST: No pneumothorax or pleural effusion. No nodules or masses. AORTIC ARCH: There is mild calcific atherosclerosis of the aortic arch. 3.6 cm diameter of descending thoracic aorta arch. Conventional 3 vessel aortic branching pattern. The visualized proximal subclavian arteries are widely patent. RIGHT CAROTID SYSTEM: Normal without aneurysm, dissection or stenosis. LEFT CAROTID SYSTEM: Normal without aneurysm, dissection or stenosis. VERTEBRAL ARTERIES: Right dominant configuration. Both origins are clearly patent. There is no dissection, occlusion or flow-limiting stenosis to the skull base (V1-V3 segments). CTA HEAD FINDINGS POSTERIOR CIRCULATION: --Vertebral arteries: Normal V4 segments. --Posterior inferior cerebellar arteries (PICA): Patent origins from the vertebral arteries. --Anterior inferior cerebellar arteries (AICA): Patent origins from the basilar artery. --Basilar artery: Normal. --Superior cerebellar  arteries: Normal. --Posterior cerebral arteries: Normal. Both originate from the basilar artery. Posterior communicating arteries (p-comm) are diminutive or absent. ANTERIOR CIRCULATION: --Intracranial internal carotid arteries: Normal. --Anterior cerebral arteries (ACA): Normal. Both A1 segments are present. Patent anterior communicating artery (a-comm). --Middle cerebral arteries (MCA): Normal. VENOUS SINUSES: As permitted by contrast timing, patent. ANATOMIC VARIANTS: None Review of the MIP images confirms the above findings. CT Brain Perfusion Findings: ASPECTS: 10 CBF (<30%) Volume: 22mL Perfusion (Tmax>6.0s) volume:  12mL Mismatch Volume: 38mL Infarction Location:Areas of elevated Tmax are located in the right frontal periventricular white matter and left cerebellum. These regions correspond to areas of hypoattenuation on the noncontrast head CT, including an old left cerebellar infarct. IMPRESSION: 1. No emergent large vessel occlusion or high-grade stenosis of the intracranial arteries. 2. Area of ischemia identified in the right frontal periventricular white matter and left cerebellum, corresponding to the areas of hypoattenuation on the noncontrast head CT and possibly artifact/pseudonormalization. No core infarct by CT perfusion analysis. 3. Aortic Atherosclerosis (ICD10-I70.0). 4. 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 Recommend annual imaging followup by CTA or MRA. This recommendation follows 2010 ACCF/AHA/AATS/ACR/ASA/SCA/SCAI/SIR/STS/SVM Guidelines for the Diagnosis and Management of Patients with Thoracic Aortic Disease. Circulation.2010; [id number]. Aortic aneurysm NOS (ICD10-I71.9) Electronically Signed: By: Ulyses Jarred M.D. On: 06/30/2019 20:03   CT HEAD CODE STROKE WO CONTRAST  Result Date: 06/30/2019 CLINICAL DATA:  Code stroke.  Left-sided weakness EXAM: CT HEAD WITHOUT CONTRAST TECHNIQUE: Contiguous axial images were obtained from the base of the skull through the vertex without intravenous  contrast. COMPARISON:  None. FINDINGS: Brain: There is no mass, hemorrhage or extra-axial collection. There is generalized atrophy without lobar predilection. There is hypoattenuation of the periventricular white matter, most commonly indicating chronic ischemic microangiopathy. Old left basal ganglia small vessel infarct. Vascular: No abnormal hyperdensity of the major intracranial arteries or dural venous sinuses. No intracranial atherosclerosis. Skull: The visualized skull base, calvarium and extracranial soft tissues are normal. Sinuses/Orbits: No fluid levels or advanced mucosal thickening of the visualized paranasal sinuses. No mastoid or middle ear effusion. The orbits are normal. ASPECTS [facility] Stroke Program Early CT Score) - Ganglionic level infarction (caudate, lentiform nuclei, internal capsule, insula, M1-M3 cortex): 7 - Supraganglionic infarction (M4-M6 cortex): 3 Total score (0-10 with 10 being normal): 10 IMPRESSION: 1. Chronic ischemic microangiopathy and generalized atrophy without acute intracranial abnormality. 2. ASPECTS is 10. * These results were communicated to Dr. Kerney Elbe at 7:30 pm on 06/30/2019 by text page via the [facility] messaging system. Electronically Signed   By: Ulyses Jarred M.D.   On: 06/30/2019 19:30   VAS US CAROTID  Result Date: 07/01/2019 Carotid Arterial Duplex Study Indications:       CVA and follow up possible carotid web versus thrombus seen                    on CT. Comparison Study:  No prior study Performing Technologist: Maudry Mayhew MHA, RDMS, RVT, RDCS  Examination Guidelines: A complete evaluation includes B-mode imaging, spectral Doppler, color Doppler, and power Doppler as needed of all accessible portions of each vessel. Bilateral testing is considered an integral part of a complete examination. Limited examinations for reoccurring indications may be performed as noted.  Right Carotid Findings:  +----------+--------+--------+--------+------------------+--------+           PSV cm/sEDV cm/sStenosisPlaque DescriptionComments +----------+--------+--------+--------+------------------+--------+ CCA Prox  51      14                                         +----------+--------+--------+--------+------------------+--------+ CCA Distal44      16                                         +----------+--------+--------+--------+------------------+--------+ ICA Prox  17  4                                          +----------+--------+--------+--------+------------------+--------+ ICA Distal59      26                                         +----------+--------+--------+--------+------------------+--------+ ECA       44      14                                         +----------+--------+--------+--------+------------------+--------+   Summary: Right Carotid: Velocities in the right ICA are consistent with a 1-39% stenosis.                There is a heterogenous mobile area of the anterior vessel wall                at the carotid bulb/proximal ICA (Images 16-19). Cannot                differentiate between possible carotid web versus thrombus versus                unknown etiology.  *See table(s) above for measurements and observations.    Preliminary     Phillips Climes M.D on 07/01/2019 at 2:33 PM  Between 7am to 7pm - Pager - [phone]  After 7pm go to [email or web address] - password [facility]  [facility] -  Office  [phone]

## 2019-07-02 ENCOUNTER — Inpatient Hospital Stay (HOSPITAL_COMMUNITY): Payer: BC Managed Care – PPO

## 2019-07-02 ENCOUNTER — Other Ambulatory Visit: Payer: Self-pay

## 2019-07-02 DIAGNOSIS — I639 Cerebral infarction, unspecified: Secondary | ICD-10-CM

## 2019-07-02 LAB — CBC
HCT: 52.1 % — ABNORMAL HIGH (ref 39.0–52.0)
Hemoglobin: 18.1 g/dL — ABNORMAL HIGH (ref 13.0–17.0)
MCH: 32.2 pg (ref 26.0–34.0)
MCHC: 34.7 g/dL (ref 30.0–36.0)
MCV: 92.7 fL (ref 80.0–100.0)
Platelets: 190 10*3/uL (ref 150–400)
RBC: 5.62 MIL/uL (ref 4.22–5.81)
RDW: 15.9 % — ABNORMAL HIGH (ref 11.5–15.5)
WBC: 4.1 10*3/uL (ref 4.0–10.5)
nRBC: 0 % (ref 0.0–0.2)

## 2019-07-02 LAB — PHOSPHORUS: Phosphorus: 3.4 mg/dL (ref 2.5–4.6)

## 2019-07-02 LAB — BASIC METABOLIC PANEL
Anion gap: 10 (ref 5–15)
BUN: <5 mg/dL — ABNORMAL LOW (ref 8–23)
CO2: 24 mmol/L (ref 22–32)
Calcium: 8.8 mg/dL — ABNORMAL LOW (ref 8.9–10.3)
Chloride: 102 mmol/L (ref 98–111)
Creatinine, Ser: 0.94 mg/dL (ref 0.61–1.24)
GFR calc Af Amer: >60 mL/min (ref >60)
GFR calc non Af Amer: >60 mL/min (ref >60)
Glucose, Bld: 101 mg/dL — ABNORMAL HIGH (ref 70–99)
Potassium: 3.9 mmol/L (ref 3.5–5.1)
Sodium: 136 mmol/L (ref 135–145)

## 2019-07-02 LAB — CERULOPLASMIN: Ceruloplasmin: 27.7 mg/dL (ref 16.0–31.0)

## 2019-07-02 MED ORDER — PANTOPRAZOLE SODIUM 40 MG PO TBEC
40.0000 mg | DELAYED_RELEASE_TABLET | Freq: Every day | ORAL | Status: DC
  Administered 2019-07-02 – 2019-07-07 (×6): 40 mg via ORAL
  Filled 2019-07-02 (×6): qty 1

## 2019-07-02 MED ORDER — ENSURE ENLIVE PO LIQD
237.0000 mL | Freq: Two times a day (BID) | ORAL | Status: DC
  Administered 2019-07-02 – 2019-07-07 (×11): 237 mL via ORAL

## 2019-07-02 MED ORDER — CHLORHEXIDINE GLUCONATE 0.12 % MT SOLN
15.0000 mL | Freq: Two times a day (BID) | OROMUCOSAL | Status: DC
  Administered 2019-07-02 – 2019-07-07 (×11): 15 mL via OROMUCOSAL
  Filled 2019-07-02 (×10): qty 15

## 2019-07-02 MED ORDER — ORAL CARE MOUTH RINSE
15.0000 mL | Freq: Two times a day (BID) | OROMUCOSAL | Status: DC
  Administered 2019-07-02 – 2019-07-07 (×9): 15 mL via OROMUCOSAL

## 2019-07-02 NOTE — Progress Notes (Signed)
Initial Nutrition Assessment  DOCUMENTATION CODES:   Not applicable  INTERVENTION:  Provide Ensure Enlive po BID, each supplement provides 350 kcal and 20 grams of protein  Encourage adequate PO intake.   NUTRITION DIAGNOSIS:   Increased nutrient needs related to acute illness as evidenced by estimated needs.  GOAL:   Patient will meet greater than or equal to 90% of their needs  MONITOR:   PO intake, Supplement acceptance, Skin, Weight trends, Labs, I & O's  REASON FOR ASSESSMENT:   Malnutrition Screening Tool    ASSESSMENT:   69 y.o. male with history of HTN, HLD, tobacco and alcohol abuse presenting with L sided weakness. MRI with multiple scattered posterior R basal ganglia, R cerebral, R cerebellar infarcts.  Pt unavailable during attempted time of visit. Diet has been advanced to a dysphagia 2 diet with thin liquids. Per weight records, pt with 8% weight loss in 3 months which is significant for time frame. RD to order nutritional supplements to aid in caloric and protein needs. Unable to complete Nutrition-Focused physical exam at this time. Labs and medications reviewed.   Diet Order:   Diet Order            DIET DYS 2 Room service appropriate? Yes; Fluid consistency: Thin  Diet effective now              EDUCATION NEEDS:   Not appropriate for education at this time  Skin:  Skin Assessment: Reviewed RN Assessment  Last BM:  4/21  Height:   Ht Readings from Last 1 Encounters:  07/02/19 5\' 10"  (1.778 m)    Weight:   Wt Readings from Last 1 Encounters:  07/02/19 61.4 kg    BMI:  Body mass index is 19.42 kg/m.  Estimated Nutritional Needs:   Kcal:  1900-2100  Protein:  90-105 grams  Fluid:  >/= 1.9 L/day   Corrin Parker, MS, RD, LDN RD pager number/after hours weekend pager number on Amion.

## 2019-07-02 NOTE — Progress Notes (Signed)
Bilateral lower extremity venous duplex has been completed. Preliminary results can be found in CV Proc through chart review.   07/02/19 11:55 AM Carlos Levering RVT

## 2019-07-02 NOTE — Progress Notes (Addendum)
Physical Therapy Evaluation  Patient presents with L sided weakness, impaired balance, decreased activity tolerance, and requires 2 person assist for sit>partial stand (approx 75% to full stand) from EOB with bed height increased. Transfer to recliner chair deferred as patient's SBP outside of permissive HTN range (>120) despite patient having BP meds this morning.  May attempt SPT or Stedy to chair next session, pending appropriateness.  Recommend continued skilled PT services and discharge to inpatient rehab.    07/02/19 1002  PT Visit Information  Last PT Received On 07/02/19  Assistance Needed +2  PT/OT/SLP Co-Evaluation/Treatment Yes  Reason for Co-Treatment Complexity of the patient's impairments (multi-system involvement);Necessary to address cognition/behavior during functional activity;For patient/therapist safety  PT goals addressed during session Mobility/safety with mobility;Balance  History of Present Illness 69 year old male admitted 06/30/19 code stroke after being found down at home (from approximately 10:30AM-5:30PM). Patient found to have acute stroke R frontal lobe and L cerebellum, likely embolic in nature. Neurology consulted. CTA head and neck as well as CUS concerning for right ICA bulb questionable filling defect. Patient outside tPA window and no invasive intervention. He continues to have LUE and LLE weakness. BP high on arrival (186/125). Permissive hypertension (<220/120) but gradually normalize in 2-3 days per neurology note. Patient on CIWA due to alcohol use. PMH: HTN, alcohol abuse (ongoing use), HLD, GERD, PUD, tobacco use   Precautions  Precautions Fall;Other (comment)  Precaution Comments NPO until SLP eval, O2 >94%  Restrictions  Weight Bearing Restrictions No  Home Living  Family/patient expects to be discharged to: Private residence  Living Arrangements Spouse/significant other  Available Help at Discharge Family;Available PRN/intermittently (wife works  but retiring in June)  Type of [place] to enter  [employer] of Steps 1 (1 step into house )  Home Layout One level  Water quality scientist None  Additional Comments Pt's wife reports they are planning to build a house (signed papework, getting walk in with grab bars, etc)  Prior Function  Level of Independence Independent  Comments Independent without AD   Pain Assessment  Pain Assessment No/denies pain  Cognition  Arousal/Alertness Awake/alert  Behavior During Therapy WFL for tasks assessed/performed  Upper Extremity Assessment  Upper Extremity Assessment Defer to OT evaluation  Lower Extremity Assessment  Lower Extremity Assessment LLE deficits/detail;RLE deficits/detail  RLE Deficits / Details >/=3/5  RLE Sensation  (intact to light touch)  LLE Deficits / Details grossly 2-/5 except for L foot 0/5  LLE Sensation  (intact to light touch)  LLE Coordination decreased gross motor;decreased fine motor  Bed Mobility  Overal bed mobility Needs Assistance  Bed Mobility Supine to Sit;Sit to Supine  Supine to sit Max assist;+2 for physical assistance;+2 for safety/equipment;HOB elevated  Sit to supine Max assist;+2 for physical assistance;+2 for safety/equipment;HOB elevated  General bed mobility comments Encouraged patient to use RUE on bedrail  Transfers  Overall transfer level Needs assistance  Equipment used None  Transfers Sit to/from Stand ((sit<>partial stand))  Sit to Stand Max assist;+2 physical assistance;+2 safety/equipment;From elevated surface  General transfer comment Sit>partial stand (approx 75% to full stand) with maxA x 2 and lateral scooting x 3 trials to left toward HOB. Block to LLE to prevent buckling. Deferred transfer to chair due to DBP outside of parameters.  Ambulation/Gait  General Gait Details not safe to attempt  Modified Rankin (Stroke Patients Only)   Pre-Morbid Rankin Score 0  Modified  Rankin 5  Balance  Overall balance assessment Needs assistance  Sitting-balance support Feet supported;Bilateral upper extremity supported  Sitting balance-Leahy Scale Poor  Sitting balance - Comments modA for static sitting balance, able to anterior weight shift with minA when cued but unable to maintain  Postural control Posterior lean;Left lateral lean  Standing balance support No upper extremity supported  Standing balance-Leahy Scale Zero  PT - End of Session  Equipment Utilized During Treatment Gait belt  Activity Tolerance Patient limited by fatigue  Patient left in bed;with call bell/phone within reach;with bed alarm set;Other (comment) (OT present in room )  Nurse Communication Mobility status;Other (comment) (OT informed RN of patient's BP)  PT Assessment  PT Recommendation/Assessment Patient needs continued PT services  PT Visit Diagnosis Hemiplegia and hemiparesis;Other abnormalities of gait and mobility (R26.89)  Hemiplegia - Right/Left Left  Hemiplegia - caused by Cerebral infarction  PT Problem List Decreased strength;Decreased activity tolerance;Decreased balance;Decreased mobility;Decreased coordination;Decreased knowledge of use of DME  Barriers to Discharge Decreased caregiver support  Barriers to Discharge Comments wife works  PT Plan  PT Frequency (ACUTE ONLY) Min 3X/week  PT Treatment/Interventions (ACUTE ONLY) DME instruction;Gait training;Functional mobility training;Therapeutic activities;Therapeutic exercise;Balance training;Neuromuscular re-education;Patient/family education;Wheelchair mobility training  AM-PAC PT "6 Clicks" Mobility Outcome Measure (Version 2)  Help needed turning from your back to your side while in a flat bed without using bedrails? 2  Help needed moving from lying on your back to sitting on the side of a flat bed without using bedrails? 2  Help needed moving to and from a bed to a chair (including a  wheelchair)? 1  Help needed standing up from a chair using your arms (e.g., wheelchair or bedside chair)? 1  Help needed to walk in hospital room? 1  Help needed climbing 3-5 steps with a railing?  1  6 Click Score 8  Consider Recommendation of Discharge To: CIR/SNF/LTACH  PT Recommendation  Recommendations for Other Services Rehab consult  Follow Up Recommendations CIR  PT equipment Other (comment) (TBD at next level of care)  Individuals Consulted  Consulted and Agree with Results and Recommendations Family member/caregiver;Patient  Family Member Consulted wife in room during session  Acute Rehab PT Goals  Patient Stated Goal to be more mobile  PT Goal Formulation With patient/family  Time For Goal Achievement 07/15/19  Potential to Achieve Goals Good  PT Time Calculation  PT Start Time (ACUTE ONLY) 1001  PT Stop Time (ACUTE ONLY) 1034  PT Time Calculation (min) (ACUTE ONLY) 33 min  PT General Charges  $$ ACUTE PT VISIT 1 Visit  PT Evaluation  $PT Eval Moderate Complexity 1 Mod   Birdie Hopes, PT, DPT Acute Rehab [phone] office

## 2019-07-02 NOTE — Consult Note (Signed)
Physical Medicine and Rehabilitation Consult Reason for Consult: Left side weakness Referring Physician: [facility]   HPI: Russell Russell is a 69 y.o. right-handed male with history of alcohol/tobacco abuse, hyperlipidemia.  History taken from chart review and wife due to memory difficulties.  Patient lives with spouse.  Wife is currently working, but Radio broadcast assistant on retiring in June.  1 level home one-step to entry.  Reportedly independent prior to admission.  He presented in the late evening of 06/30/2019 with left hemiparesis.  Cranial CT unremarkable for acute intracranial abnormality.  Patient did not receive TPA.  CT angio of head and neck no emergent large vessel occlusion or high-grade stenosis.  MRI showed multiple scattered acute ischemic infarcts involving the posterior right basal ganglia right cerebral hemisphere and right cerebellum.  Echocardiogram with ejection fraction of 60%.  Lower extremity Dopplers negative for DVT.  Admission chemistries unremarkable, alcohol level 18, SARS coronavirus negative.  Currently maintained on aspirin and Plavix for CVA prophylaxis.  Subcutaneous Lovenox for DVT prophylaxis.  Hospital course further complicated by dysphagia and started on dysphagia #2 thin liquid diet.  Therapy evaluations completed with recommendations of physical medicine rehab consult.  Review of Systems  Constitutional: Positive for malaise/fatigue. Negative for chills and fever.  HENT: Negative for hearing loss.   Eyes: Negative for blurred vision and double vision.  Respiratory: Negative for cough and shortness of breath.   Cardiovascular: Negative for chest pain, palpitations and leg swelling.  Gastrointestinal: Positive for constipation. Negative for heartburn, nausea and vomiting.       GERD  Genitourinary: Negative for dysuria, flank pain and hematuria.  Musculoskeletal: Positive for myalgias.  Skin: Negative for rash.  Neurological: Positive for speech change, focal  weakness and weakness. Negative for sensory change.  All other systems reviewed and are negative.  Past Medical History:  Diagnosis Date  . Alcohol abuse    drinks 1 gallon of Gin every weekend, nothing during the week x >15 yrs  . Elevated transaminase level    +elev bili: abd u/s 06/2014 showed stable small hepatic hemangiomas, o/w normal.  . Essential hypertension   . GERD (gastroesophageal reflux disease)   . Hyperlipidemia 03/2014   Atorv started-chol improved  . Impaired fasting glucose 03/2014  . Nephrolithiasis   . PUD (peptic ulcer disease)   . Tobacco dependence    chantix: "psych effects"   Past Surgical History:  Procedure Laterality Date  . COLONOSCOPY  2005   Recall 10 yrs ([place])  . removal of kidney stones  2006   cystoscopic--removed from ureter.  No prob since.   Family History  Problem Relation Age of Onset  . Colon cancer Mother   . Cancer Father   . Breast cancer Sister   . Breast cancer Sister    Social History:  reports that he has been smoking cigarettes. He has a 10.00 pack-year smoking history. He has never used smokeless tobacco. He reports current alcohol use of about 8.0 standard drinks of alcohol per week. He reports that he does not use drugs. Allergies:  Allergies  Allergen Reactions  . Penicillins Rash   Medications Prior to Admission  Medication Sig Dispense Refill  . atorvastatin (LIPITOR) 40 MG tablet Take 1 tablet (40 mg total) by mouth daily. 90 tablet 1  . amLODipine (NORVASC) 10 MG tablet Take 1 tablet (10 mg total) by mouth daily. (Patient not taking: Reported on 06/30/2019) 30 tablet 1  . halobetasol (ULTRAVATE) 0.05 % cream Apply  topically 2 (two) times daily. (Patient not taking: Reported on 06/30/2019) 50 g 3    Home: Home Living Family/patient expects to be discharged to:: Private residence Living Arrangements: Spouse/significant other Available Help at Discharge: Family, Available PRN/intermittently(wife works but  retiring in June) Type of Home: [place] Access: Stairs to enter [employer] of Steps: 1(1 step into house ) Home Layout: One level Bathroom Shower/Tub: Chiropodist: Standard Home Equipment: None Additional Comments: Pt's wife reports they are planning to build a house (signed papework, getting walk in with grab bars, etc)  Functional History: Prior Function Level of Independence: Independent Comments: Independent without AD  Functional Status:  Mobility: Bed Mobility Overal bed mobility: Needs Assistance Bed Mobility: Supine to Sit Supine to sit: Mod assist, Max assist, +2 for physical assistance, +2 for safety/equipment, HOB elevated Sit to supine: Max assist, +2 for physical assistance, +2 for safety/equipment, HOB elevated General bed mobility comments: Encouraged use of bedrail with RUE Transfers Overall transfer level: Needs assistance Equipment used: None Transfer via Lift Equipment: Stedy Transfers: Sit to/from Stand Sit to Stand: Max assist, +2 physical assistance, +2 safety/equipment, From elevated surface General transfer comment: use of Stedy for transfer EOB>chair with bed height increased. Patient leans to L in sitting and standing and requires cues and assist for midline positioning with use of Stedy. Ambulation/Gait General Gait Details: not safe to attempt at this time    ADL: ADL Overall ADL's : Needs assistance/impaired Eating/Feeding: Sitting, Set up Grooming: Moderate assistance, Sitting Grooming Details (indicate cue type and reason): Assistance to wipe mouth due to difficulty keeping secretions in mouth Upper Body Bathing: Sitting, Maximal assistance Lower Body Bathing: Total assistance, Bed level Upper Body Dressing : Maximal assistance, Sitting Upper Body Dressing Details (indicate cue type and reason): Max A to don hospital gown around back and front  Lower Body Dressing: Total assistance, Bed level Toilet Transfer:  +2 for physical assistance Toilet Transfer Details (indicate cue type and reason): did not attempt, but suspect +2 physical assist required Toileting- Clothing Manipulation and Hygiene: Total assistance, Bed level General ADL Comments: Pt requires increased assistance for ADLs due to decreased strength in L UE/LE and core, decreased sitting balance, coordination, endurance   Cognition: Cognition Orientation Level: Oriented X4 Cognition Arousal/Alertness: Awake/alert Behavior During Therapy: WFL for tasks assessed/performed General Comments: Able to answer questions appropriately, follow directions  Blood pressure (!) 144/107, pulse (!) 102, temperature 98.5 F (36.9 C), temperature source Oral, resp. rate 20, height 5\' 10"  (1.778 m), weight 63.2 kg, SpO2 90 %. Physical Exam  Vitals reviewed. Constitutional: He appears well-developed and well-nourished.  HENT:  Head: Normocephalic and atraumatic.  Eyes: EOM are normal. Right eye exhibits no discharge. Left eye exhibits no discharge.  Neck: No tracheal deviation present. No thyromegaly present.  Respiratory: Effort normal. No stridor. No respiratory distress.  GI: Soft. He exhibits no distension.  Musculoskeletal:     Comments: No edema or tenderness in extremities  Neurological: He is alert.  Patient is alert and impulsive.   Makes eye contact with examiner.   Dysarthria Follows commands.   Provides name and age.   Limited awareness of deficits.  Motor: Right upper extremity: 4 -/5 with ataxia Left upper extremity: Shoulder abduction 2/5, elbow flexion/extension 2+/5, handgrip 3/5 with apraxia Right lower extremity: 2/5 proximal distal with ataxia Left lower extremity: 0/5 Sensation intact light touch  Skin: Skin is warm and dry.  Psychiatric: He has a normal mood and affect. His  behavior is normal.    Results for orders placed or performed during the hospital encounter of 06/30/19 (from the past 24 hour(s))  Basic metabolic  panel     Status: Abnormal   Collection Time: 07/03/19  7:03 AM  Result Value Ref Range   Sodium 135 135 - 145 mmol/L   Potassium 3.7 3.5 - 5.1 mmol/L   Chloride 101 98 - 111 mmol/L   CO2 24 22 - 32 mmol/L   Glucose, Bld 92 70 - 99 mg/dL   BUN <5 (L) 8 - 23 mg/dL   Creatinine, Ser 1.02 0.61 - 1.24 mg/dL   Calcium 8.9 8.9 - 10.3 mg/dL   GFR calc non Af Amer >60 >60 mL/min   GFR calc Af Amer >60 >60 mL/min   Anion gap 10 5 - 15   EP PPM/ICD IMPLANT  Result Date: 07/03/2019 SURGEON:  Thompson Grayer, MD   PREPROCEDURE DIAGNOSIS:  Cryptogenic Stroke   POSTPROCEDURE DIAGNOSIS:  Cryptogenic Stroke    PROCEDURES:  1. Implantable loop recorder implantation   INTRODUCTION:  Jennifer Quintos is a 69 y.o. male with a history of unexplained stroke who presents today for implantable loop implantation.  The patient has had a cryptogenic stroke.  Despite an extensive workup by neurology, no reversible causes have been identified.  he has worn telemetry during which he did not have arrhythmias.  There is significant concern for possible atrial fibrillation as the cause for the patients stroke.  The patient therefore presents today for implantable loop implantation.   DESCRIPTION OF PROCEDURE:  Informed written consent was obtained.  The patient required no sedation for the procedure today.  The patients left chest was prepped and draped. Mapping over the patient's chest was performed to identify the appropriate ILR site.  This area was found to be the left parasternal region over the 3rd-4th intercostal space.  The skin overlying this region was infiltrated with lidocaine for local analgesia.  A 0.5-cm incision was made at the implant site.  A subcutaneous ILR pocket was fashioned using a combination of sharp and blunt dissection.  A Medtronic Reveal Linq model [id number] implantable loop recorder was then placed into the pocket R waves were very prominent and measured > 0.2 mV. EBL<1 ml.  Steri- Strips and a sterile  dressing were then applied.  There were no early apparent complications.   CONCLUSIONS:  1. Successful implantation of a Medtronic Reveal LINQ implantable loop recorder for cryptogenic stroke  2. No early apparent complications. Thompson Grayer MD, [facility] 07/03/2019 12:57 PM   ECHOCARDIOGRAM COMPLETE BUBBLE STUDY  Result Date: 07/01/2019    ECHOCARDIOGRAM REPORT   Patient Name:   Russell Russell Date of Exam: 07/01/2019 Medical Rec #:  [id number]      Height:       70.0 in Accession #:    [id number]     Weight:       151.0 lb Date of Birth:  02-13-1951     BSA:          1.852 m Patient Age:    84 years       BP:           161/130 mmHg Patient Gender: M              HR:           83 bpm. Exam Location:  Inpatient Procedure: 2D Echo, Color Doppler, Cardiac Doppler and Saline Contrast Bubble  Study Indications:    Stroke i163.9  History:        Patient has no prior history of Echocardiogram examinations.                 Risk Factors:Hypertension and Dyslipidemia.  Sonographer:    Raquel Sarna Senior RDCS Referring Phys: [place]  Sonographer Comments: Technically difficult study due to poor echo windows, suboptimal parasternal window and suboptimal apical window. Technically difficult due to lung interference and respiratory variation. Patient coughing and moving thoughout exam. No parasternal, apical, or suprasternal window. IMPRESSIONS  1. Very poor study. All views basically obtained from the subcostal views.  2. Left ventricular ejection fraction, by estimation, is 55 to 60%. The left ventricle has normal function. The left ventricle has no regional wall motion abnormalities. Left ventricular diastolic function could not be evaluated.  3. Right ventricular systolic function is normal. The right ventricular size is normal.  4. The mitral valve is grossly normal. No evidence of mitral valve regurgitation. No evidence of mitral stenosis.  5. The aortic valve is grossly normal. Aortic valve regurgitation is  not visualized. No aortic stenosis is present.  6. The inferior vena cava is normal in size with greater than 50% respiratory variability, suggesting right atrial pressure of 3 mmHg.  7. Agitated saline contrast bubble study was negative, with no evidence of any interatrial shunt. Conclusion(s)/Recommendation(s): No intracardiac source of embolism detected on this transthoracic study. A transesophageal echocardiogram is recommended to exclude cardiac source of embolism if clinically indicated. FINDINGS  Left Ventricle: Left ventricular ejection fraction, by estimation, is 55 to 60%. The left ventricle has normal function. The left ventricle has no regional wall motion abnormalities. The left ventricular internal cavity size was normal in size. There is  no left ventricular hypertrophy. Left ventricular diastolic function could not be evaluated due to nondiagnostic images. Left ventricular diastolic function could not be evaluated. Right Ventricle: The right ventricular size is normal. No increase in right ventricular wall thickness. Right ventricular systolic function is normal. Left Atrium: Left atrial size was normal in size. Right Atrium: Right atrial size was normal in size. Pericardium: Trivial pericardial effusion is present. Mitral Valve: The mitral valve is grossly normal. No evidence of mitral valve regurgitation. No evidence of mitral valve stenosis. Tricuspid Valve: The tricuspid valve is grossly normal. Tricuspid valve regurgitation is trivial. No evidence of tricuspid stenosis. Aortic Valve: The aortic valve is grossly normal. Aortic valve regurgitation is not visualized. No aortic stenosis is present. Pulmonic Valve: The pulmonic valve was grossly normal. Pulmonic valve regurgitation is not visualized. No evidence of pulmonic stenosis. Aorta: The aortic root is normal in size and structure. Venous: The inferior vena cava is normal in size with greater than 50% respiratory variability, suggesting right  atrial pressure of 3 mmHg. IAS/Shunts: No atrial level shunt detected by color flow Doppler. Agitated saline contrast was given intravenously to evaluate for intracardiac shunting. Agitated saline contrast bubble study was negative, with no evidence of any interatrial shunt.  LEFT VENTRICLE PLAX 2D LVIDd:         3.80 cm  Diastology LVIDs:         2.50 cm  LV e' lateral: 6.20 cm/s LV PW:         1.10 cm  LV e' medial:  4.57 cm/s LV IVS:        1.00 cm LVOT diam:     2.10 cm LV SV:  48 LV SV Index:   26 LVOT Area:     3.46 cm  RIGHT VENTRICLE RV S prime:     9.46 cm/s TAPSE (M-mode): 1.8 cm LEFT ATRIUM           Index       RIGHT ATRIUM           Index LA diam:      3.30 cm 1.78 cm/m  RA Area:     19.00 cm LA Vol (A4C): 50.4 ml 27.18 ml/m RA Volume:   57.90 ml  31.26 ml/m  AORTIC VALVE LVOT Vmax:   64.00 cm/s LVOT Vmean:  50.200 cm/s LVOT VTI:    0.140 m  AORTA Ao Root diam: 3.60 cm  SHUNTS Systemic VTI:  0.14 m Systemic Diam: 2.10 cm Eleonore Chiquito MD Electronically signed by Eleonore Chiquito MD Signature Date/Time: 07/01/2019/3:25:46 PM    Final    VAS [place] LOWER EXTREMITY VENOUS (DVT)  Result Date: 07/02/2019  Lower Venous DVTStudy Indications: Stroke.  Risk Factors: None identified. Comparison Study: No prior studies. Performing Technologist: Oliver Hum RVT  Examination Guidelines: A complete evaluation includes B-mode imaging, spectral Doppler, color Doppler, and power Doppler as needed of all accessible portions of each vessel. Bilateral testing is considered an integral part of a complete examination. Limited examinations for reoccurring indications may be performed as noted. The reflux portion of the exam is performed with the patient in reverse Trendelenburg.  +---------+---------------+---------+-----------+----------+--------------+ RIGHT    CompressibilityPhasicitySpontaneityPropertiesThrombus Aging +---------+---------------+---------+-----------+----------+--------------+ CFV       Full           Yes      Yes                                 +---------+---------------+---------+-----------+----------+--------------+ SFJ      Full                                                        +---------+---------------+---------+-----------+----------+--------------+ FV Prox  Full                                                        +---------+---------------+---------+-----------+----------+--------------+ FV Mid   Full                                                        +---------+---------------+---------+-----------+----------+--------------+ FV DistalFull                                                        +---------+---------------+---------+-----------+----------+--------------+ PFV      Full                                                        +---------+---------------+---------+-----------+----------+--------------+  POP      Full           Yes      Yes                                 +---------+---------------+---------+-----------+----------+--------------+ PTV      Full                                                        +---------+---------------+---------+-----------+----------+--------------+ PERO     Full                                                        +---------+---------------+---------+-----------+----------+--------------+   +---------+---------------+---------+-----------+----------+--------------+ LEFT     CompressibilityPhasicitySpontaneityPropertiesThrombus Aging +---------+---------------+---------+-----------+----------+--------------+ CFV      Full           Yes      Yes                                 +---------+---------------+---------+-----------+----------+--------------+ SFJ      Full                                                        +---------+---------------+---------+-----------+----------+--------------+ FV Prox  Full                                                         +---------+---------------+---------+-----------+----------+--------------+ FV Mid   Full                                                        +---------+---------------+---------+-----------+----------+--------------+ FV DistalFull                                                        +---------+---------------+---------+-----------+----------+--------------+ PFV      Full                                                        +---------+---------------+---------+-----------+----------+--------------+ POP      Full           Yes      Yes                                 +---------+---------------+---------+-----------+----------+--------------+  PTV      Full                                                        +---------+---------------+---------+-----------+----------+--------------+ PERO     Full                                                        +---------+---------------+---------+-----------+----------+--------------+     Summary: RIGHT: - There is no evidence of deep vein thrombosis in the lower extremity.  - No cystic structure found in the popliteal fossa.  LEFT: - There is no evidence of deep vein thrombosis in the lower extremity.  - No cystic structure found in the popliteal fossa.  *See table(s) above for measurements and observations. Electronically signed by Monica Martinez MD on 07/02/2019 at 8:09:03 PM.    Final     Assessment/Plan: Diagnosis:  posterior right basal ganglia right cerebral hemisphere and right cerebellum infarcts. Stroke: Continue secondary stroke prophylaxis and Risk Factor Modification listed below:   Antiplatelet therapy:  Blood Pressure Management:  Continue current medication with prn's with permisive HTN per primary team Statin Agent:   Prediabetes management:   L>Rsided hemiparesis: fit for orthosis to prevent contractures (resting hand splint for day, wrist cock up splint at night, PRAFO,  etc) PT/OT for mobility, ADL training  Labs independently reviewed.  Records reviewed and summated above.  1. Does the need for close, 24 hr/day medical supervision in concert with the patient's rehab needs make it unreasonable for this patient to be served in a less intensive setting? Yes 2. Co-Morbidities requiring supervision/potential complications: alcohol/tobacco abuse (counsel), hyperlipidemia (continue meds), HTN (monitor and provide prns in accordance with increased physical exertion and pain), prediabetes (Monitor in accordance with exercise and adjust meds as necessary), poststroke dysphagia (therapies, advance as tolerated), polycythemia (repeat labs, consider intervention necessary) 3. Due to bladder management, bowel management, safety, skin/wound care, disease management, pain management and patient education, does the patient require 24 hr/day rehab nursing? Yes 4. Does the patient require coordinated care of a physician, rehab nurse, therapy disciplines of PT/OT/SLP to address physical and functional deficits in the context of the above medical diagnosis(es)? Yes Addressing deficits in the following areas: balance, endurance, locomotion, strength, transferring, bowel/bladder control, bathing, dressing, feeding, grooming, toileting, cognition, speech, language, swallowing and psychosocial support 5. Can the patient actively participate in an intensive therapy program of at least 3 hrs of therapy per day at least 5 days per week? Potentially 6. The potential for patient to make measurable gains while on inpatient rehab is excellent 7. Anticipated functional outcomes upon discharge from inpatient rehab are min assist and mod assist  with PT, min assist and mod assist with OT, supervision with SLP. 8. Estimated rehab length of stay to reach the above functional goals is: 16-19 days. 9. Anticipated discharge destination: Home 10. Overall Rehab/Functional Prognosis:  good  RECOMMENDATIONS: This patient's condition is appropriate for continued rehabilitative care in the following setting: CIR completion of medical work-up if caregiver support available upon discharge. Patient has agreed to participate in recommended program. Yes Note that insurance prior authorization may be required for reimbursement for  recommended care.  Comment: Rehab Admissions Coordinator to follow up.  I have personally performed a face to face diagnostic evaluation, including, but not limited to relevant history and physical exam findings, of this patient and developed relevant assessment and plan.  Additionally, I have reviewed and concur with the physician assistant's documentation above.   Delice Lesch, MD, ABPMR Lavon Paganini Angiulli, PA-C 07/03/2019

## 2019-07-02 NOTE — Progress Notes (Signed)
Occupational Therapy Evaluation Patient Details Name: Russell Russell MRN: [id number] DOB: 12-09-1950 Today's Date: 07/02/2019    History of Present Illness 69 year old male admitted 06/30/19 code stroke after being found down at home (from approximately 10:30AM-5:30PM). Patient found to have acute stroke R frontal lobe and L cerebellum, likely embolic in nature. Neurology consulted. CTA head and neck as well as CUS concerning for right ICA bulb questionable filling defect. Patient outside tPA window and no invasive intervention. He continues to have LUE and LLE weakness. BP high on arrival (186/125). Permissive hypertension (<220/120) but gradually normalize in 2-3 days per neurology note. Patient on CIWA due to alcohol use. PMH: HTN, alcohol abuse (ongoing use), HLD, GERD, PUD, tobacco use    Clinical Impression   PTA, pt lives with wife and was completely Independent in ADLs, IADLs, and mobility without AD. Pt presents now after CVA with L sided weakness (affecting L UE and LE), decreased endurance, sitting/standing balance, and coordination. Pt able to feel light touch to L UE (decreased to L LE), PROM WFL, but AROM < 25% grossly at all L UE joints with weak grip strength. Pt Max A + 2 for bed mobility to sit EOB, grossly Mod A to maintain sitting balance requiring frequent cues to correct (pt reports able to acknowledge when leaning). Pt Max A to don hospital gown and required assistance to wipe mouth when losing secretions. Max A + 2 for 2 partial sit to stand trials with L knee blocking. Pt Max A + 2 for lateral scooting EOB. Based on high PLOF, motivation to get better, and wife support, recommend inpatient rehab.     Follow Up Recommendations  CIR;Supervision/Assistance - 24 hour    Equipment Recommendations  Other (comment)(TBD)    Recommendations for Other Services Rehab consult     Precautions / Restrictions Precautions Precautions: Fall;Other (comment) Precaution Comments: NPO  until SLP eval, O2 >94%, monitor BP, L sided weakness Restrictions Weight Bearing Restrictions: No      Mobility Bed Mobility Overal bed mobility: Needs Assistance Bed Mobility: Supine to Sit;Sit to Supine     Supine to sit: Max assist;+2 for physical assistance;+2 for safety/equipment;HOB elevated Sit to supine: Max assist;+2 for physical assistance;+2 for safety/equipment;HOB elevated   General bed mobility comments: Encouraged patient to use RUE on bedrail  Transfers Overall transfer level: Needs assistance Equipment used: None Transfers: Sit to/from Stand((sit<>partial stand)) Sit to Stand: Max assist;+2 physical assistance;+2 safety/equipment;From elevated surface         General transfer comment: Sit>partial stand (approx 75% to full stand) with maxA x 2 and lateral scooting x 3 trials to left toward HOB. Deferred transfer to chair due to DBP outside of parameters.    Balance Overall balance assessment: Needs assistance Sitting-balance support: Feet supported;Bilateral upper extremity supported Sitting balance-Leahy Scale: Poor Sitting balance - Comments: modA for static sitting balance, able to anterior weight shift with minA when cued but unable to maintain Postural control: Posterior lean;Left lateral lean Standing balance support: No upper extremity supported Standing balance-Leahy Scale: Zero                             ADL either performed or assessed with clinical judgement   ADL Overall ADL's : Needs assistance/impaired Eating/Feeding: Sitting;Set up   Grooming: Moderate assistance;Sitting Grooming Details (indicate cue type and reason): Assistance to wipe mouth due to difficulty keeping secretions in mouth Upper Body Bathing: Sitting;Maximal assistance  Lower Body Bathing: Total assistance;Bed level   Upper Body Dressing : Maximal assistance;Sitting Upper Body Dressing Details (indicate cue type and reason): Max A to don hospital gown  around back and front  Lower Body Dressing: Total assistance;Bed level   Toilet Transfer: +2 for physical assistance Toilet Transfer Details (indicate cue type and reason): did not attempt, but suspect +2 physical assist required Toileting- Clothing Manipulation and Hygiene: Total assistance;Bed level         General ADL Comments: Pt requires increased assistance for ADLs due to decreased strength in L UE/LE and core, decreased sitting balance, coordination, endurance      Vision         Perception     Praxis      Pertinent Vitals/Pain Pain Assessment: No/denies pain     Hand Dominance Right   Extremity/Trunk Assessment Upper Extremity Assessment Upper Extremity Assessment: Generalized weakness;LUE deficits/detail LUE Deficits / Details: Overall 2-/5 strength in L UE, weak L hand grasp, cannot perform opposition, < 25% AROM at all joints  LUE Sensation: (reports able to feel therapist touching L UE) LUE Coordination: decreased fine motor;decreased gross motor   Lower Extremity Assessment Lower Extremity Assessment: Defer to PT evaluation RLE Deficits / Details: >/=3/5 RLE Sensation: (intact to light touch) LLE Deficits / Details: grossly 2-/5 except for L foot 0/5 LLE Sensation: (intact to light touch) LLE Coordination: decreased gross motor;decreased fine motor       Communication Communication Communication: Other (comment)(slurred speech)   Cognition Arousal/Alertness: Awake/alert Behavior During Therapy: WFL for tasks assessed/performed                                   General Comments: Able to answer questions appropriately, follow directions   General Comments  Assessed BP sitting EOB 150s/120s, after standing 160s/130s. Deferred transfer OOB due to DBP outside of parameters    Exercises     Shoulder Instructions      Home Living Family/patient expects to be discharged to:: Private residence Living Arrangements: Spouse/significant  other Available Help at Discharge: Family;Available PRN/intermittently(wife works but retiring in June) Type of Home: House Home Access: Stairs to enter [employer] of Steps: 1(1 step into house )   Home Layout: One level     Bathroom Shower/Tub: Teacher, early years/pre: Standard     Home Equipment: None   Additional Comments: Pt's wife reports they are planning to build a house (signed papework, getting walk in with grab bars, etc)      Prior Functioning/Environment Level of Independence: Independent        Comments: Independent without AD         OT Problem List: Decreased strength;Decreased range of motion;Decreased activity tolerance;Impaired balance (sitting and/or standing);Decreased coordination;Decreased knowledge of use of DME or AE;Impaired tone;Impaired UE functional use      OT Treatment/Interventions: Self-care/ADL training;Therapeutic exercise;Neuromuscular education;Energy conservation;DME and/or AE instruction;Manual therapy;Modalities;Therapeutic activities;Patient/family education    OT Goals(Current goals can be found in the care plan section) Acute Rehab OT Goals Patient Stated Goal: to be more mobile OT Goal Formulation: With patient/family Time For Goal Achievement: 07/16/19 Potential to Achieve Goals: Good ADL Goals Pt Will Perform Grooming: with supervision;sitting Pt Will Perform Upper Body Bathing: sitting;with mod assist Pt Will Perform Lower Body Bathing: with mod assist;sitting/lateral leans Pt Will Perform Upper Body Dressing: with min assist;sitting Pt Will Perform Lower Body Dressing: with mod assist;sitting/lateral  leans Pt Will Transfer to Toilet: with mod assist;with +2 assist;stand pivot transfer;bedside commode Pt Will Perform Toileting - Clothing Manipulation and hygiene: with mod assist;sitting/lateral leans Pt/caregiver will Perform Home Exercise Program: Increased strength;Left upper extremity;With  Supervision;With written HEP provided  OT Frequency: Min 2X/week   Barriers to D/C:            Co-evaluation PT/OT/SLP Co-Evaluation/Treatment: Yes Reason for Co-Treatment: Complexity of the patient's impairments (multi-system involvement);Necessary to address cognition/behavior during functional activity;For patient/therapist safety;To address functional/ADL transfers PT goals addressed during session: Mobility/safety with mobility;Balance OT goals addressed during session: ADL's and self-care      AM-PAC OT "6 Clicks" Daily Activity     Outcome Measure Help from another person eating meals?: A Little Help from another person taking care of personal grooming?: A Little Help from another person toileting, which includes using toliet, bedpan, or urinal?: Total Help from another person bathing (including washing, rinsing, drying)?: Total Help from another person to put on and taking off regular upper body clothing?: A Lot Help from another person to put on and taking off regular lower body clothing?: Total 6 Click Score: 11   End of Session Equipment Utilized During Treatment: Gait belt Nurse Communication: Mobility status  Activity Tolerance: Patient tolerated treatment well;Patient limited by fatigue Patient left: in bed;with call bell/phone within reach;with bed alarm set;with family/visitor present  OT Visit Diagnosis: Unsteadiness on feet (R26.81);Other abnormalities of gait and mobility (R26.89);Muscle weakness (generalized) (M62.81)                Time: [id number] OT Time Calculation (min): 36 min Charges:  OT General Charges $OT Visit: 1 Visit OT Evaluation $OT Eval Moderate Complexity: 1 Mod  Layla Maw, OTR/L  Layla Maw 07/02/2019, 1:55 PM

## 2019-07-02 NOTE — Progress Notes (Signed)
Received report from ED. Joanna Borawski Thacker, RN 

## 2019-07-02 NOTE — Progress Notes (Signed)
Pt admitted to 5w31 from ED. Pt lives at home with wife. Pt is A&Ox3. Pt has left sided weakness. Pt placed on telemetry on bedside monitor. Pt has dry flaky skin,  bruising to bilateral legs and scratch marks to right hip. Pt oriented to room, told to call for assistance before getting out of bed. Pt verbalized understanding. Pt placed on high fall precautions and bed alarm turned on. Will continue to monitor pt. Ranelle Oyster, RN

## 2019-07-02 NOTE — Progress Notes (Signed)
Inpatient Rehab Admissions Coordinator Note:   Per PT recommendations, pt was screened for CIR candidacy by Shann Medal, PT, DPT.  At this time we are recommending an inpatient rehab consult.  I will place an order per our protocol.  Please contact me with questions.   Shann Medal, PT, DPT [phone] 07/02/19 1:51 PM

## 2019-07-02 NOTE — Evaluation (Addendum)
Clinical/Bedside Swallow Evaluation Patient Details  Name: Russell Russell MRN: [id number] Date of Birth: 1951-03-08  Today's Date: 07/02/2019 Time: SLP Start Time (ACUTE ONLY): 1310 SLP Stop Time (ACUTE ONLY): 1323 SLP Time Calculation (min) (ACUTE ONLY): 13 min  Past Medical History:  Past Medical History:  Diagnosis Date  . Alcohol abuse    drinks 1 gallon of Gin every weekend, nothing during the week x >15 yrs  . Elevated transaminase level    +elev bili: abd u/s 06/2014 showed stable small hepatic hemangiomas, o/w normal.  . Essential hypertension   . GERD (gastroesophageal reflux disease)   . Hyperlipidemia 03/2014   Atorv started-chol improved  . Impaired fasting glucose 03/2014  . Nephrolithiasis   . PUD (peptic ulcer disease)   . Tobacco dependence    chantix: "psych effects"   Past Surgical History:  Past Surgical History:  Procedure Laterality Date  . COLONOSCOPY  2005   Recall 10 yrs ([place])  . removal of kidney stones  2006   cystoscopic--removed from ureter.  No prob since.   HPI:  [name] is a 69 y.o. male with history of HTN, HLD, tobacco and alcohol abuse presenting with L sided weakness. Pt has significant dysarthria and left sided weakness. MRI showed multifocal right hemisphere infarcts and right cerebellar small infarcts. Embolic pattern. However, CTA head and neck as well as CUS concerning for right ICA bulb questionable filling defect.    Assessment / Plan / Recommendation Clinical Impression  Pt demonstrates a moderate oral dysaphgia complicated by decreased safety awareness and impulsivity. Pt have significant left CN VII weakness, but also some decreased lingual control. With verbal cues to slow rate, conduct lingual sweep and initiate a second swallow, pt tolerates puree and thin liquids adequately. He did exhibit an iniaital immediate cough response when trying to masticate ice, but futher trials of thin consumed consecutively did not  elicit signs of aspiration. Recommend a dys 2 (finely chopped) diet with thin liquids. Will f/u for tolerance and further instruction.  SLP Visit Diagnosis: Dysphagia, oropharyngeal phase (R13.12)    Aspiration Risk  Mild aspiration risk    Diet Recommendation Dysphagia 2 (Fine chop);Nectar-thick liquid   Liquid Administration via: Straw Medication Administration: Whole meds with puree Supervision: Patient able to self feed Compensations: Slow rate;Small sips/bites Postural Changes: Seated upright at 90 degrees    Other  Recommendations Oral Care Recommendations: Oral care before and after PO   Follow up Recommendations Inpatient Rehab      Frequency and Duration min 2x/week  2 weeks       Prognosis Prognosis for Safe Diet Advancement: Good      Swallow Study   General HPI: [name] is a 69 y.o. male with history of HTN, HLD, tobacco and alcohol abuse presenting with L sided weakness. Pt has significant dysarthria and left sided weakness. MRI showed multifocal right hemisphere infarcts and right cerebellar small infarcts. Embolic pattern. However, CTA head and neck as well as CUS concerning for right ICA bulb questionable filling defect.  Type of Study: Bedside Swallow Evaluation Diet Prior to this Study: NPO Temperature Spikes Noted: No Respiratory Status: Room air History of Recent Intubation: No Behavior/Cognition: Alert;Cooperative;Pleasant mood Oral Cavity Assessment: Within Functional Limits Oral Care Completed by SLP: No Oral Cavity - Dentition: Adequate natural dentition Self-Feeding Abilities: Able to feed self;Needs assist Patient Positioning: Upright in bed Baseline Vocal Quality: Normal Volitional Cough: Strong Volitional Swallow: Able to elicit    Oral/Motor/Sensory Function  Overall Oral Motor/Sensory Function: Moderate impairment Facial ROM: Reduced left;Suspected CN VII (facial) dysfunction Facial Symmetry: Abnormal symmetry left;Suspected CN  VII (facial) dysfunction Facial Strength: Reduced left;Suspected CN VII (facial) dysfunction Facial Sensation: Reduced left;Suspected CN V (Trigeminal) dysfunction Lingual ROM: Suspected CN XII (hypoglossal) dysfunction Lingual Symmetry: Abnormal symmetry left Lingual Strength: Suspected CN XII (hypoglossal) dysfunction   Ice Chips Ice chips: Impaired Presentation: Spoon Oral Phase Impairments: Impaired mastication;Reduced labial seal Pharyngeal Phase Impairments: Cough - Immediate   Thin Liquid Thin Liquid: Within functional limits Presentation: Straw;Cup;Spoon    Nectar Thick Nectar Thick Liquid: Not tested   Honey Thick Honey Thick Liquid: Not tested   Puree Puree: Impaired Presentation: Spoon;Self Fed Oral Phase Impairments: Reduced labial seal Oral Phase Functional Implications: Left anterior spillage;Left lateral sulci pocketing;Oral residue   Solid     Solid: Impaired Presentation: Self Fed Oral Phase Impairments: Reduced labial seal;Reduced lingual movement/coordination Oral Phase Functional Implications: Prolonged oral transit;Right anterior spillage;Oral residue     Herbie Baltimore, MA CCC-SLP  Acute Rehabilitation Services Pager [phone] Office [phone]  Othelia Pulling, Katherene Ponto 07/02/2019,1:51 PM

## 2019-07-02 NOTE — Progress Notes (Signed)
PROGRESS NOTE                                                                                                                                                                                                             Patient Demographics:    Russell Russell, is a 69 y.o. male, DOB - September 17, 1950, [id number]  Admit date - 06/30/2019   Admitting Physician Dwyane Dee, MD  Outpatient Primary MD for the patient is Inda Coke, [place]  LOS - 2   Chief Complaint  Patient presents with  . Code Stroke       Brief Narrative    [name] is a 69 y.o. male with history of HTN, HLD, tobacco and alcohol abuse presenting with L sided weakness, MRI significant for acute CVA, he was admitted for further work-up.   Subjective:    Russell Russell today has, No headache, No chest pain, No abdominal pain , does report minimal improvement in his left-sided weakness, he denies any chest pain or shortness of breath.   Assessment  & Plan :    Active Problems:   Stroke [facility])   Acute CVA: -Neurology input greatly appreciated, evidence of multiple scattered anterior and posterior right brain infarct, likely embolic, secondary to unclear source. - CTA head & neck no ELVO. Aortic atherosclerosis. 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  Subtle R ICA bulb indistinct filling defect possible small carotid web vs less likely thrombus. - CT perfusion R frontal periventricular white matter and L cerebellar artifact/pseudonormalization. No core infarct.  - MRI  Multiple scattered posterior R basal ganglia, R cerebral, R cerebellar infarcts.  Small vessel disease. Atrophy. Multiple old lacunes B basal ganglia, thalami, pons and L cerebellar infarct  -2D echo with a preserved EF 55 to 60%, with no source of embolus -Lower extremity venous Doppler with no DVT -Admit to neurology, likely will need loop recorder to rule out A. Fib.  No evidence of A. fib on telemetry. -Currently  on aspirin and Plavix 75 mg oral daily, need to continue for 18-month, and then aspirin alone -PT/OT> CIR consulted. -SLP consulted: Dysphagia 2  Hypertension/hypertensive urgency -Allow for permissive hypertension  Hyperlipidemia -Should be on Lipitor 40 mg oral daily, but he is noncompliant, LDL is 109, goal is less than 70.  Patient will right carotid filling defect -Web versus thrombus, will  continue dual antiplatelet therapy for 3 months, then repeat CTA neck and CUS for further evaluation  Alcohol abuse -On folic acid, B1 and multivitamin, on CIWA protocol  hypertension -Allow for permissive hypertension  Tobacco abuse -He was counseled  Polycythemia HCT 19.2-20.1-18.0 - smoker  Code Status : Full  Family Communication  : wife at bedsdie  Disposition Plan  :  Status is: Inpatient   Dispo: The patient is from: Home              Anticipated d/c is to: SNF vs CIR              Anticipated d/c date is: 3 days              Patient currently is not medically stable to d/c.  Consults  :  neurology  Procedures  : None  DVT Prophylaxis  :  Lovenox  Lab Results  Component Value Date   PLT 190 07/02/2019    Antibiotics  :    Anti-infectives (From admission, onward)   None        Objective:   Vitals:   07/02/19 0720 07/02/19 0735 07/02/19 0800 07/02/19 1209  BP: (!) 176/126 (!) 160/117 (!) 154/110 (!) 149/118  Pulse: 91 82 70 78  Resp: 14 18 14 19   Temp:  97.8 F (36.6 C)  98.3 F (36.8 C)  TempSrc:  Axillary  Axillary  SpO2: 98% 96% 96% 94%  Weight:      Height:        Wt Readings from Last 3 Encounters:  07/02/19 61.4 kg  04/24/19 65.4 kg  03/24/19 66.9 kg     Intake/Output Summary (Last 24 hours) at 07/02/2019 1617 Last data filed at 07/02/2019 [id number] Gross per 24 hour  Intake 206.25 ml  Output --  Net 206.25 ml     Physical Exam  Awake Alert, more awake and coherent today, he remains with significant dysarthria, and dense left-sided  weakness . Symmetrical Chest wall movement, Good air movement bilaterally, CTAB RRR,No Gallops,Rubs or new Murmurs, No Parasternal Heave +ve B.Sounds, Abd Soft, No tenderness, No rebound - guarding or rigidity. No Cyanosis, Clubbing or edema, No new Rash or bruise       Data Review:    CBC Recent Labs  Lab 06/30/19 1919 06/30/19 1926 07/01/19 0315 07/02/19 0636  WBC 4.6  --  3.4* 4.1  HGB 19.2* 20.1* 18.0* 18.1*  HCT 54.7* 59.0* 52.3* 52.1*  PLT 190  --  216 190  MCV 91.8  --  92.2 92.7  MCH 32.2  --  31.7 32.2  MCHC 35.1  --  34.4 34.7  RDW 16.0*  --  15.8* 15.9*  LYMPHSABS 0.7  --   --   --   MONOABS 0.5  --   --   --   EOSABS 0.0  --   --   --   BASOSABS 0.0  --   --   --     Chemistries  Recent Labs  Lab 06/30/19 1919 06/30/19 1926 07/01/19 0315 07/02/19 0636  NA 136 137 138 136  K 4.0 3.8 3.5 3.9  CL 101 101 102 102  CO2 23  --  19* 24  GLUCOSE 134* 132* 133* 101*  BUN 7* 8 6* <5*  CREATININE 1.16 0.90 0.98 0.94  CALCIUM 9.4  --  9.1 8.8*  MG  --   --  1.7  --   AST 53*  --   --   --  ALT 29  --   --   --   ALKPHOS 100  --   --   --   BILITOT 1.4*  --   --   --    ------------------------------------------------------------------------------------------------------------------ Recent Labs    07/01/19 0315  CHOL 196  HDL 77  LDLCALC 109*  TRIG 52  CHOLHDL 2.5    Lab Results  Component Value Date   HGBA1C 6.2 (H) 07/01/2019   ------------------------------------------------------------------------------------------------------------------ Recent Labs    07/01/19 0315  TSH 2.209   ------------------------------------------------------------------------------------------------------------------ Recent Labs    07/01/19 0315  VITAMINB12 383  FOLATE 5.6*    Coagulation profile Recent Labs  Lab 06/30/19 1955  INR 1.0    No results for input(s): DDIMER in the last 72 hours.  Cardiac Enzymes No results for input(s): CKMB,  TROPONINI, MYOGLOBIN in the last 168 hours.  Invalid input(s): CK ------------------------------------------------------------------------------------------------------------------ No results found for: BNP  Inpatient Medications  Scheduled Meds: . aspirin  300 mg Rectal Daily   Or  . aspirin  325 mg Oral Daily  . atorvastatin  40 mg Oral Daily  . chlorhexidine  15 mL Mouth Rinse BID  . clopidogrel  75 mg Oral Daily  . enoxaparin (LOVENOX) injection  40 mg Subcutaneous Q24H  . feeding supplement (ENSURE ENLIVE)  237 mL Oral BID BM  . folic acid  1 mg Intravenous Daily  . LORazepam  0-4 mg Intravenous Q6H   Followed by  . [START ON 07/03/2019] LORazepam  0-4 mg Intravenous Q12H  . mouth rinse  15 mL Mouth Rinse q12n4p  . multivitamin with minerals  1 tablet Oral Daily  . pantoprazole  40 mg Oral Daily  . thiamine  100 mg Oral Daily   Or  . thiamine  100 mg Intravenous Daily   Continuous Infusions: . sodium chloride 75 mL/hr at 07/02/19 1136   PRN Meds:.acetaminophen **OR** acetaminophen (TYLENOL) oral liquid 160 mg/5 mL **OR** acetaminophen, labetalol, LORazepam **OR** LORazepam, senna-docusate  Micro Results Recent Results (from the past 240 hour(s))  SARS CORONAVIRUS 2 (TAT 6-24 HRS) Nasopharyngeal Nasopharyngeal Swab     Status: None   Collection Time: 06/30/19  9:58 PM   Specimen: Nasopharyngeal Swab  Result Value Ref Range Status   SARS Coronavirus 2 NEGATIVE NEGATIVE Final    Comment: (NOTE) SARS-CoV-2 target nucleic acids are NOT DETECTED. The SARS-CoV-2 RNA is generally detectable in upper and lower respiratory specimens during the acute phase of infection. Negative results do not preclude SARS-CoV-2 infection, do not rule out co-infections with other pathogens, and should not be used as the sole basis for treatment or other patient management decisions. Negative results must be combined with clinical observations, patient history, and epidemiological  information. The expected result is Negative. Fact Sheet for Patients: SugarRoll.be Fact Sheet for Healthcare Providers: https://www.woods-mathews.com/ This test is not yet approved or cleared by the [place] FDA and  has been authorized for detection and/or diagnosis of SARS-CoV-2 by FDA under an Emergency Use Authorization (EUA). This EUA will remain  in effect (meaning this test can be used) for the duration of the COVID-19 declaration under Section 56 4(b)(1) of the Act, 21 U.S.C. section 360bbb-3(b)(1), unless the authorization is terminated or revoked sooner. Performed at [facility], [place] [address], [place], [place] [address]     Radiology Reports CT Code Stroke CTA Head W/WO contrast  Addendum Date: 07/01/2019   ADDENDUM REPORT: 07/01/2019 09:49 ADDENDUM: Study discussed by telephone with Dr. Erlinda Hong on 07/01/2019 at  0930 hours. There is subtle heterogeneity at the lateral right ICA bulb (series 4, image 108) suggesting a small, indistinct filling defect in the vessel. Perhaps this is a small carotid web, or less likely adherent thrombus, with some flow related motion. We discussed that I think Carotid Doppler Ultrasound focused on the Right ICA bulb would best evaluate these possibilities. Electronically Signed   By: Genevie Ann M.D.   On: 07/01/2019 09:49   Result Date: 07/01/2019 CLINICAL DATA:  Left-sided weakness EXAM: CT ANGIOGRAPHY HEAD AND NECK CT PERFUSION BRAIN TECHNIQUE: Multidetector CT imaging of the head and neck was performed using the standard protocol during bolus administration of intravenous contrast. Multiplanar CT image reconstructions and MIPs were obtained to evaluate the vascular anatomy. Carotid stenosis measurements (when applicable) are obtained utilizing NASCET criteria, using the distal internal carotid diameter as the denominator. Multiphase CT imaging of the brain was performed following IV bolus contrast  injection. Subsequent parametric perfusion maps were calculated using RAPID software. CONTRAST:  197mL OMNIPAQUE IOHEXOL 350 MG/ML SOLN COMPARISON:  None. FINDINGS: CTA NECK FINDINGS SKELETON: There is no bony spinal canal stenosis. No lytic or blastic lesion. OTHER NECK: Normal pharynx, larynx and major salivary glands. No cervical lymphadenopathy. Unremarkable thyroid gland. UPPER CHEST: No pneumothorax or pleural effusion. No nodules or masses. AORTIC ARCH: There is mild calcific atherosclerosis of the aortic arch. 3.6 cm diameter of descending thoracic aorta arch. Conventional 3 vessel aortic branching pattern. The visualized proximal subclavian arteries are widely patent. RIGHT CAROTID SYSTEM: Normal without aneurysm, dissection or stenosis. LEFT CAROTID SYSTEM: Normal without aneurysm, dissection or stenosis. VERTEBRAL ARTERIES: Right dominant configuration. Both origins are clearly patent. There is no dissection, occlusion or flow-limiting stenosis to the skull base (V1-V3 segments). CTA HEAD FINDINGS POSTERIOR CIRCULATION: --Vertebral arteries: Normal V4 segments. --Posterior inferior cerebellar arteries (PICA): Patent origins from the vertebral arteries. --Anterior inferior cerebellar arteries (AICA): Patent origins from the basilar artery. --Basilar artery: Normal. --Superior cerebellar arteries: Normal. --Posterior cerebral arteries: Normal. Both originate from the basilar artery. Posterior communicating arteries (p-comm) are diminutive or absent. ANTERIOR CIRCULATION: --Intracranial internal carotid arteries: Normal. --Anterior cerebral arteries (ACA): Normal. Both A1 segments are present. Patent anterior communicating artery (a-comm). --Middle cerebral arteries (MCA): Normal. VENOUS SINUSES: As permitted by contrast timing, patent. ANATOMIC VARIANTS: None Review of the MIP images confirms the above findings. CT Brain Perfusion Findings: ASPECTS: 10 CBF (<30%) Volume: 23mL Perfusion (Tmax>6.0s) volume:  48mL Mismatch Volume: 37mL Infarction Location:Areas of elevated Tmax are located in the right frontal periventricular white matter and left cerebellum. These regions correspond to areas of hypoattenuation on the noncontrast head CT, including an old left cerebellar infarct. IMPRESSION: 1. No emergent large vessel occlusion or high-grade stenosis of the intracranial arteries. 2. Area of ischemia identified in the right frontal periventricular white matter and left cerebellum, corresponding to the areas of hypoattenuation on the noncontrast head CT and possibly artifact/pseudonormalization. No core infarct by CT perfusion analysis. 3. Aortic Atherosclerosis (ICD10-I70.0). 4. 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 Recommend annual imaging followup by CTA or MRA. This recommendation follows 2010 ACCF/AHA/AATS/ACR/ASA/SCA/SCAI/SIR/STS/SVM Guidelines for the Diagnosis and Management of Patients with Thoracic Aortic Disease. Circulation.2010; [id number]. Aortic aneurysm NOS (ICD10-I71.9) Electronically Signed: By: Ulyses Jarred M.D. On: 06/30/2019 20:03   CT Code Stroke CTA Neck W/WO contrast  Addendum Date: 07/01/2019   ADDENDUM REPORT: 07/01/2019 09:49 ADDENDUM: Study discussed by telephone with Dr. Erlinda Hong on 07/01/2019 at 0930 hours. There is subtle heterogeneity at the lateral right ICA bulb (  series 4, image 108) suggesting a small, indistinct filling defect in the vessel. Perhaps this is a small carotid web, or less likely adherent thrombus, with some flow related motion. We discussed that I think Carotid Doppler Ultrasound focused on the Right ICA bulb would best evaluate these possibilities. Electronically Signed   By: Genevie Ann M.D.   On: 07/01/2019 09:49   Result Date: 07/01/2019 CLINICAL DATA:  Left-sided weakness EXAM: CT ANGIOGRAPHY HEAD AND NECK CT PERFUSION BRAIN TECHNIQUE: Multidetector CT imaging of the head and neck was performed using the standard protocol during bolus administration of intravenous contrast.  Multiplanar CT image reconstructions and MIPs were obtained to evaluate the vascular anatomy. Carotid stenosis measurements (when applicable) are obtained utilizing NASCET criteria, using the distal internal carotid diameter as the denominator. Multiphase CT imaging of the brain was performed following IV bolus contrast injection. Subsequent parametric perfusion maps were calculated using RAPID software. CONTRAST:  126mL OMNIPAQUE IOHEXOL 350 MG/ML SOLN COMPARISON:  None. FINDINGS: CTA NECK FINDINGS SKELETON: There is no bony spinal canal stenosis. No lytic or blastic lesion. OTHER NECK: Normal pharynx, larynx and major salivary glands. No cervical lymphadenopathy. Unremarkable thyroid gland. UPPER CHEST: No pneumothorax or pleural effusion. No nodules or masses. AORTIC ARCH: There is mild calcific atherosclerosis of the aortic arch. 3.6 cm diameter of descending thoracic aorta arch. Conventional 3 vessel aortic branching pattern. The visualized proximal subclavian arteries are widely patent. RIGHT CAROTID SYSTEM: Normal without aneurysm, dissection or stenosis. LEFT CAROTID SYSTEM: Normal without aneurysm, dissection or stenosis. VERTEBRAL ARTERIES: Right dominant configuration. Both origins are clearly patent. There is no dissection, occlusion or flow-limiting stenosis to the skull base (V1-V3 segments). CTA HEAD FINDINGS POSTERIOR CIRCULATION: --Vertebral arteries: Normal V4 segments. --Posterior inferior cerebellar arteries (PICA): Patent origins from the vertebral arteries. --Anterior inferior cerebellar arteries (AICA): Patent origins from the basilar artery. --Basilar artery: Normal. --Superior cerebellar arteries: Normal. --Posterior cerebral arteries: Normal. Both originate from the basilar artery. Posterior communicating arteries (p-comm) are diminutive or absent. ANTERIOR CIRCULATION: --Intracranial internal carotid arteries: Normal. --Anterior cerebral arteries (ACA): Normal. Both A1 segments are  present. Patent anterior communicating artery (a-comm). --Middle cerebral arteries (MCA): Normal. VENOUS SINUSES: As permitted by contrast timing, patent. ANATOMIC VARIANTS: None Review of the MIP images confirms the above findings. CT Brain Perfusion Findings: ASPECTS: 10 CBF (<30%) Volume: 22mL Perfusion (Tmax>6.0s) volume: 58mL Mismatch Volume: 41mL Infarction Location:Areas of elevated Tmax are located in the right frontal periventricular white matter and left cerebellum. These regions correspond to areas of hypoattenuation on the noncontrast head CT, including an old left cerebellar infarct. IMPRESSION: 1. No emergent large vessel occlusion or high-grade stenosis of the intracranial arteries. 2. Area of ischemia identified in the right frontal periventricular white matter and left cerebellum, corresponding to the areas of hypoattenuation on the noncontrast head CT and possibly artifact/pseudonormalization. No core infarct by CT perfusion analysis. 3. Aortic Atherosclerosis (ICD10-I70.0). 4. 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 Recommend annual imaging followup by CTA or MRA. This recommendation follows 2010 ACCF/AHA/AATS/ACR/ASA/SCA/SCAI/SIR/STS/SVM Guidelines for the Diagnosis and Management of Patients with Thoracic Aortic Disease. Circulation.2010; [id number]. Aortic aneurysm NOS (ICD10-I71.9) Electronically Signed: By: Ulyses Jarred M.D. On: 06/30/2019 20:03   MR BRAIN WO CONTRAST  Result Date: 07/01/2019 CLINICAL DATA:  Follow-up examination for acute stroke. EXAM: MRI HEAD WITHOUT CONTRAST TECHNIQUE: Multiplanar, multiecho pulse sequences of the brain and surrounding structures were obtained without intravenous contrast. COMPARISON:  Prior CTs from 06/30/2019. FINDINGS: Brain: Examination degraded by motion artifact. Generalized age-related  cerebral atrophy. Extensive patchy and confluent T2/FLAIR hyperintensity within the periventricular deep white matter both cerebral hemispheres as well as the pons,  most consistent with chronic small vessel ischemic disease, fairly advanced in nature. Multiple superimposed remote lacunar infarcts seen about the bilateral basal ganglia, thalami, and pons. Chronic left cerebellar infarct noted as well. Approximate 2.5 cm linear focus of restricted diffusion seen extending from the posterior right lentiform nucleus into the posterior right caudate and corona radiata, consistent with an acute ischemic perforator type infarct (series 5, image 73). Few additional scattered ischemic infarcts seen involving the subcortical white matter of the overlying right frontal lobe (w series 5, image 83, 81). Additional subcentimeter acute ischemic infarct noted within the subcortical right occipital lobe (series 5, image 63). Approximate 1 cm acute ischemic infarcts seen involving the inferior right cerebellum (series 5, image 53). No definite associated hemorrhage about these infarcts. No associated mass effect. No mass lesion or midline shift. Mild ventricular prominence related to global parenchymal volume loss without hydrocephalus. No extra-axial fluid collection. Pituitary gland suprasellar region within normal limits. Midline structures intact. Vascular: Major intracranial vascular flow voids are maintained. Skull and upper cervical spine: Craniocervical junction within normal limits. Bone marrow signal intensity normal. No scalp soft tissue abnormality. Sinuses/Orbits: Globes and orbital soft tissues within normal limits. Mild scattered mucosal thickening noted throughout the paranasal sinuses. No mastoid effusion. Inner ear structures normal. Other: None. IMPRESSION: 1. Multiple scattered acute ischemic infarcts involving the posterior right basal ganglia, right cerebral hemisphere, and right cerebellum as above. No associated hemorrhage or mass effect. 2. Underlying atrophy with advanced chronic microvascular ischemic disease, with multiple remote lacunar infarcts about the bilateral  basal ganglia, thalami, and pons, with additional chronic left cerebellar infarct. Electronically Signed   By: Jeannine Boga M.D.   On: 07/01/2019 01:29   CT Code Stroke Cerebral Perfusion with contrast  Addendum Date: 07/01/2019   ADDENDUM REPORT: 07/01/2019 09:49 ADDENDUM: Study discussed by telephone with Dr. Erlinda Hong on 07/01/2019 at 0930 hours. There is subtle heterogeneity at the lateral right ICA bulb (series 4, image 108) suggesting a small, indistinct filling defect in the vessel. Perhaps this is a small carotid web, or less likely adherent thrombus, with some flow related motion. We discussed that I think Carotid Doppler Ultrasound focused on the Right ICA bulb would best evaluate these possibilities. Electronically Signed   By: Genevie Ann M.D.   On: 07/01/2019 09:49   Result Date: 07/01/2019 CLINICAL DATA:  Left-sided weakness EXAM: CT ANGIOGRAPHY HEAD AND NECK CT PERFUSION BRAIN TECHNIQUE: Multidetector CT imaging of the head and neck was performed using the standard protocol during bolus administration of intravenous contrast. Multiplanar CT image reconstructions and MIPs were obtained to evaluate the vascular anatomy. Carotid stenosis measurements (when applicable) are obtained utilizing NASCET criteria, using the distal internal carotid diameter as the denominator. Multiphase CT imaging of the brain was performed following IV bolus contrast injection. Subsequent parametric perfusion maps were calculated using RAPID software. CONTRAST:  135mL OMNIPAQUE IOHEXOL 350 MG/ML SOLN COMPARISON:  None. FINDINGS: CTA NECK FINDINGS SKELETON: There is no bony spinal canal stenosis. No lytic or blastic lesion. OTHER NECK: Normal pharynx, larynx and major salivary glands. No cervical lymphadenopathy. Unremarkable thyroid gland. UPPER CHEST: No pneumothorax or pleural effusion. No nodules or masses. AORTIC ARCH: There is mild calcific atherosclerosis of the aortic arch. 3.6 cm diameter of descending thoracic aorta  arch. Conventional 3 vessel aortic branching pattern. The visualized proximal subclavian arteries are widely  patent. RIGHT CAROTID SYSTEM: Normal without aneurysm, dissection or stenosis. LEFT CAROTID SYSTEM: Normal without aneurysm, dissection or stenosis. VERTEBRAL ARTERIES: Right dominant configuration. Both origins are clearly patent. There is no dissection, occlusion or flow-limiting stenosis to the skull base (V1-V3 segments). CTA HEAD FINDINGS POSTERIOR CIRCULATION: --Vertebral arteries: Normal V4 segments. --Posterior inferior cerebellar arteries (PICA): Patent origins from the vertebral arteries. --Anterior inferior cerebellar arteries (AICA): Patent origins from the basilar artery. --Basilar artery: Normal. --Superior cerebellar arteries: Normal. --Posterior cerebral arteries: Normal. Both originate from the basilar artery. Posterior communicating arteries (p-comm) are diminutive or absent. ANTERIOR CIRCULATION: --Intracranial internal carotid arteries: Normal. --Anterior cerebral arteries (ACA): Normal. Both A1 segments are present. Patent anterior communicating artery (a-comm). --Middle cerebral arteries (MCA): Normal. VENOUS SINUSES: As permitted by contrast timing, patent. ANATOMIC VARIANTS: None Review of the MIP images confirms the above findings. CT Brain Perfusion Findings: ASPECTS: 10 CBF (<30%) Volume: 53mL Perfusion (Tmax>6.0s) volume: 56mL Mismatch Volume: 36mL Infarction Location:Areas of elevated Tmax are located in the right frontal periventricular white matter and left cerebellum. These regions correspond to areas of hypoattenuation on the noncontrast head CT, including an old left cerebellar infarct. IMPRESSION: 1. No emergent large vessel occlusion or high-grade stenosis of the intracranial arteries. 2. Area of ischemia identified in the right frontal periventricular white matter and left cerebellum, corresponding to the areas of hypoattenuation on the noncontrast head CT and possibly  artifact/pseudonormalization. No core infarct by CT perfusion analysis. 3. Aortic Atherosclerosis (ICD10-I70.0). 4. 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 Recommend annual imaging followup by CTA or MRA. This recommendation follows 2010 ACCF/AHA/AATS/ACR/ASA/SCA/SCAI/SIR/STS/SVM Guidelines for the Diagnosis and Management of Patients with Thoracic Aortic Disease. Circulation.2010; [id number]. Aortic aneurysm NOS (ICD10-I71.9) Electronically Signed: By: Ulyses Jarred M.D. On: 06/30/2019 20:03   ECHOCARDIOGRAM COMPLETE BUBBLE STUDY  Result Date: 07/01/2019    ECHOCARDIOGRAM REPORT   Patient Name:   Russell Russell Date of Exam: 07/01/2019 Medical Rec #:  [id number]      Height:       70.0 in Accession #:    [id number]     Weight:       151.0 lb Date of Birth:  1950/08/03     BSA:          1.852 m Patient Age:    35 years       BP:           161/130 mmHg Patient Gender: M              HR:           83 bpm. Exam Location:  Inpatient Procedure: 2D Echo, Color Doppler, Cardiac Doppler and Saline Contrast Bubble            Study Indications:    Stroke i163.9  History:        Patient has no prior history of Echocardiogram examinations.                 Risk Factors:Hypertension and Dyslipidemia.  Sonographer:    Raquel Sarna Senior RDCS Referring Phys: [place]  Sonographer Comments: Technically difficult study due to poor echo windows, suboptimal parasternal window and suboptimal apical window. Technically difficult due to lung interference and respiratory variation. Patient coughing and moving thoughout exam. No parasternal, apical, or suprasternal window. IMPRESSIONS  1. Very poor study. All views basically obtained from the subcostal views.  2. Left ventricular ejection fraction, by estimation, is 55 to 60%. The left ventricle has normal function. The left ventricle  has no regional wall motion abnormalities. Left ventricular diastolic function could not be evaluated.  3. Right ventricular systolic function is normal. The  right ventricular size is normal.  4. The mitral valve is grossly normal. No evidence of mitral valve regurgitation. No evidence of mitral stenosis.  5. The aortic valve is grossly normal. Aortic valve regurgitation is not visualized. No aortic stenosis is present.  6. The inferior vena cava is normal in size with greater than 50% respiratory variability, suggesting right atrial pressure of 3 mmHg.  7. Agitated saline contrast bubble study was negative, with no evidence of any interatrial shunt. Conclusion(s)/Recommendation(s): No intracardiac source of embolism detected on this transthoracic study. A transesophageal echocardiogram is recommended to exclude cardiac source of embolism if clinically indicated. FINDINGS  Left Ventricle: Left ventricular ejection fraction, by estimation, is 55 to 60%. The left ventricle has normal function. The left ventricle has no regional wall motion abnormalities. The left ventricular internal cavity size was normal in size. There is  no left ventricular hypertrophy. Left ventricular diastolic function could not be evaluated due to nondiagnostic images. Left ventricular diastolic function could not be evaluated. Right Ventricle: The right ventricular size is normal. No increase in right ventricular wall thickness. Right ventricular systolic function is normal. Left Atrium: Left atrial size was normal in size. Right Atrium: Right atrial size was normal in size. Pericardium: Trivial pericardial effusion is present. Mitral Valve: The mitral valve is grossly normal. No evidence of mitral valve regurgitation. No evidence of mitral valve stenosis. Tricuspid Valve: The tricuspid valve is grossly normal. Tricuspid valve regurgitation is trivial. No evidence of tricuspid stenosis. Aortic Valve: The aortic valve is grossly normal. Aortic valve regurgitation is not visualized. No aortic stenosis is present. Pulmonic Valve: The pulmonic valve was grossly normal. Pulmonic valve regurgitation is  not visualized. No evidence of pulmonic stenosis. Aorta: The aortic root is normal in size and structure. Venous: The inferior vena cava is normal in size with greater than 50% respiratory variability, suggesting right atrial pressure of 3 mmHg. IAS/Shunts: No atrial level shunt detected by color flow Doppler. Agitated saline contrast was given intravenously to evaluate for intracardiac shunting. Agitated saline contrast bubble study was negative, with no evidence of any interatrial shunt.  LEFT VENTRICLE PLAX 2D LVIDd:         3.80 cm  Diastology LVIDs:         2.50 cm  LV e' lateral: 6.20 cm/s LV PW:         1.10 cm  LV e' medial:  4.57 cm/s LV IVS:        1.00 cm LVOT diam:     2.10 cm LV SV:         48 LV SV Index:   26 LVOT Area:     3.46 cm  RIGHT VENTRICLE RV S prime:     9.46 cm/s TAPSE (M-mode): 1.8 cm LEFT ATRIUM           Index       RIGHT ATRIUM           Index LA diam:      3.30 cm 1.78 cm/m  RA Area:     19.00 cm LA Vol (A4C): 50.4 ml 27.18 ml/m RA Volume:   57.90 ml  31.26 ml/m  AORTIC VALVE LVOT Vmax:   64.00 cm/s LVOT Vmean:  50.200 cm/s LVOT VTI:    0.140 m  AORTA Ao Root diam: 3.60 cm  SHUNTS Systemic VTI:  0.14 m Systemic Diam: 2.10 cm Eleonore Chiquito MD Electronically signed by Eleonore Chiquito MD Signature Date/Time: 07/01/2019/3:25:46 PM    Final    CT HEAD CODE STROKE WO CONTRAST  Result Date: 06/30/2019 CLINICAL DATA:  Code stroke.  Left-sided weakness EXAM: CT HEAD WITHOUT CONTRAST TECHNIQUE: Contiguous axial images were obtained from the base of the skull through the vertex without intravenous contrast. COMPARISON:  None. FINDINGS: Brain: There is no mass, hemorrhage or extra-axial collection. There is generalized atrophy without lobar predilection. There is hypoattenuation of the periventricular white matter, most commonly indicating chronic ischemic microangiopathy. Old left basal ganglia small vessel infarct. Vascular: No abnormal hyperdensity of the major intracranial arteries or  dural venous sinuses. No intracranial atherosclerosis. Skull: The visualized skull base, calvarium and extracranial soft tissues are normal. Sinuses/Orbits: No fluid levels or advanced mucosal thickening of the visualized paranasal sinuses. No mastoid or middle ear effusion. The orbits are normal. ASPECTS [facility] Stroke Program Early CT Score) - Ganglionic level infarction (caudate, lentiform nuclei, internal capsule, insula, M1-M3 cortex): 7 - Supraganglionic infarction (M4-M6 cortex): 3 Total score (0-10 with 10 being normal): 10 IMPRESSION: 1. Chronic ischemic microangiopathy and generalized atrophy without acute intracranial abnormality. 2. ASPECTS is 10. * These results were communicated to Dr. Kerney Elbe at 7:30 pm on 06/30/2019 by text page via the [facility] messaging system. Electronically Signed   By: Ulyses Jarred M.D.   On: 06/30/2019 19:30   VAS US CAROTID  Result Date: 07/02/2019 Carotid Arterial Duplex Study Indications:       CVA and follow up possible carotid web versus thrombus seen                    on CT. Comparison Study:  No prior study Performing Technologist: Maudry Mayhew MHA, RDMS, RVT, RDCS  Examination Guidelines: A complete evaluation includes B-mode imaging, spectral Doppler, color Doppler, and power Doppler as needed of all accessible portions of each vessel. Bilateral testing is considered an integral part of a complete examination. Limited examinations for reoccurring indications may be performed as noted.  Right Carotid Findings: +----------+--------+--------+--------+------------------+--------+           PSV cm/sEDV cm/sStenosisPlaque DescriptionComments +----------+--------+--------+--------+------------------+--------+ CCA Prox  51      14                                         +----------+--------+--------+--------+------------------+--------+ CCA Distal44      16                                          +----------+--------+--------+--------+------------------+--------+ ICA Prox  17      4                                          +----------+--------+--------+--------+------------------+--------+ ICA Distal59      26                                         +----------+--------+--------+--------+------------------+--------+ ECA       44      14                                         +----------+--------+--------+--------+------------------+--------+  Summary: Right Carotid: Velocities in the right ICA are consistent with a 1-39% stenosis.                There is a heterogenous mobile area of the anterior vessel wall                at the carotid bulb/proximal ICA (Images 16-19). Cannot                differentiate between possible carotid web versus thrombus versus                unknown etiology.  *See table(s) above for measurements and observations.  Electronically signed by Antony Contras MD on 07/02/2019 at 65:44:11 AM.   Final    VAS [place] LOWER EXTREMITY VENOUS (DVT)  Result Date: 07/02/2019  Lower Venous DVTStudy Indications: Stroke.  Risk Factors: None identified. Comparison Study: No prior studies. Performing Technologist: Oliver Hum RVT  Examination Guidelines: A complete evaluation includes B-mode imaging, spectral Doppler, color Doppler, and power Doppler as needed of all accessible portions of each vessel. Bilateral testing is considered an integral part of a complete examination. Limited examinations for reoccurring indications may be performed as noted. The reflux portion of the exam is performed with the patient in reverse Trendelenburg.  +---------+---------------+---------+-----------+----------+--------------+ RIGHT    CompressibilityPhasicitySpontaneityPropertiesThrombus Aging +---------+---------------+---------+-----------+----------+--------------+ CFV      Full           Yes      Yes                                  +---------+---------------+---------+-----------+----------+--------------+ SFJ      Full                                                        +---------+---------------+---------+-----------+----------+--------------+ FV Prox  Full                                                        +---------+---------------+---------+-----------+----------+--------------+ FV Mid   Full                                                        +---------+---------------+---------+-----------+----------+--------------+ FV DistalFull                                                        +---------+---------------+---------+-----------+----------+--------------+ PFV      Full                                                        +---------+---------------+---------+-----------+----------+--------------+ POP      Full  Yes      Yes                                 +---------+---------------+---------+-----------+----------+--------------+ PTV      Full                                                        +---------+---------------+---------+-----------+----------+--------------+ PERO     Full                                                        +---------+---------------+---------+-----------+----------+--------------+   +---------+---------------+---------+-----------+----------+--------------+ LEFT     CompressibilityPhasicitySpontaneityPropertiesThrombus Aging +---------+---------------+---------+-----------+----------+--------------+ CFV      Full           Yes      Yes                                 +---------+---------------+---------+-----------+----------+--------------+ SFJ      Full                                                        +---------+---------------+---------+-----------+----------+--------------+ FV Prox  Full                                                         +---------+---------------+---------+-----------+----------+--------------+ FV Mid   Full                                                        +---------+---------------+---------+-----------+----------+--------------+ FV DistalFull                                                        +---------+---------------+---------+-----------+----------+--------------+ PFV      Full                                                        +---------+---------------+---------+-----------+----------+--------------+ POP      Full           Yes      Yes                                 +---------+---------------+---------+-----------+----------+--------------+ PTV  Full                                                        +---------+---------------+---------+-----------+----------+--------------+ PERO     Full                                                        +---------+---------------+---------+-----------+----------+--------------+     Summary: RIGHT: - There is no evidence of deep vein thrombosis in the lower extremity.  - No cystic structure found in the popliteal fossa.  LEFT: - There is no evidence of deep vein thrombosis in the lower extremity.  - No cystic structure found in the popliteal fossa.  *See table(s) above for measurements and observations.    Preliminary     Phillips Climes M.D on 07/02/2019 at 4:17 PM  Between 7am to 7pm - Pager - [phone]  After 7pm go to [email or web address] - password [email or web address]  [facility] -  Office  [phone]

## 2019-07-02 NOTE — Progress Notes (Signed)
STROKE TEAM PROGRESS NOTE   INTERVAL HISTORY Wife at bedside. Pt awake alert, passed swallow on diet. Still moderate to severe dysarthria. Left hemiparesis improving. CUS showed possible right carotid web vs. Thrombus but subtle. Will continue DAPT. Recommend loop recorder.   Vitals:   07/02/19 0720 07/02/19 0735 07/02/19 0800 07/02/19 1209  BP: (!) 176/126 (!) 160/117 (!) 154/110 (!) 149/118  Pulse: 91 82 70 78  Resp: 14 18 14 19   Temp:  97.8 F (36.6 C)  98.3 F (36.8 C)  TempSrc:  Axillary  Axillary  SpO2: 98% 96% 96% 94%  Weight:      Height:        CBC:  Recent Labs  Lab 06/30/19 1919 06/30/19 1926 07/01/19 0315 07/02/19 0636  WBC 4.6   < > 3.4* 4.1  NEUTROABS 3.4  --   --   --   HGB 19.2*   < > 18.0* 18.1*  HCT 54.7*   < > 52.3* 52.1*  MCV 91.8   < > 92.2 92.7  PLT 190   < > 216 190   < > = values in this interval not displayed.    Basic Metabolic Panel:  Recent Labs  Lab 07/01/19 0315 07/02/19 0636  NA 138 136  K 3.5 3.9  CL 102 102  CO2 19* 24  GLUCOSE 133* 101*  BUN 6* <5*  CREATININE 0.98 0.94  CALCIUM 9.1 8.8*  MG 1.7  --   PHOS  --  3.4   Lipid Panel:     Component Value Date/Time   CHOL 196 07/01/2019 0315   TRIG 52 07/01/2019 0315   HDL 77 07/01/2019 0315   CHOLHDL 2.5 07/01/2019 0315   VLDL 10 07/01/2019 0315   LDLCALC 109 (H) 07/01/2019 0315   HgbA1c:  Lab Results  Component Value Date   HGBA1C 6.2 (H) 07/01/2019   Urine Drug Screen:     Component Value Date/Time   LABOPIA NONE DETECTED 07/01/2019 0400   COCAINSCRNUR NONE DETECTED 07/01/2019 0400   LABBENZ NONE DETECTED 07/01/2019 0400   AMPHETMU NONE DETECTED 07/01/2019 0400   THCU NONE DETECTED 07/01/2019 0400   LABBARB NONE DETECTED 07/01/2019 0400    Alcohol Level     Component Value Date/Time   ETH 18 (H) 06/30/2019 1924    IMAGING past 24 hours ECHOCARDIOGRAM COMPLETE BUBBLE STUDY  Result Date: 07/01/2019    ECHOCARDIOGRAM REPORT   Patient Name:   Russell Russell Date of Exam: 07/01/2019 Medical Rec #:  [id number]      Height:       70.0 in Accession #:    [id number]     Weight:       151.0 lb Date of Birth:  03-Jan-1951     BSA:          1.852 m Patient Age:    4 years       BP:           161/130 mmHg Patient Gender: M              HR:           83 bpm. Exam Location:  Inpatient Procedure: 2D Echo, Color Doppler, Cardiac Doppler and Saline Contrast Bubble            Study Indications:    Stroke i163.9  History:        Patient has no prior history of Echocardiogram examinations.  Risk Factors:Hypertension and Dyslipidemia.  Sonographer:    Raquel Sarna Senior RDCS Referring Phys: [place]  Sonographer Comments: Technically difficult study due to poor echo windows, suboptimal parasternal window and suboptimal apical window. Technically difficult due to lung interference and respiratory variation. Patient coughing and moving thoughout exam. No parasternal, apical, or suprasternal window. IMPRESSIONS  1. Very poor study. All views basically obtained from the subcostal views.  2. Left ventricular ejection fraction, by estimation, is 55 to 60%. The left ventricle has normal function. The left ventricle has no regional wall motion abnormalities. Left ventricular diastolic function could not be evaluated.  3. Right ventricular systolic function is normal. The right ventricular size is normal.  4. The mitral valve is grossly normal. No evidence of mitral valve regurgitation. No evidence of mitral stenosis.  5. The aortic valve is grossly normal. Aortic valve regurgitation is not visualized. No aortic stenosis is present.  6. The inferior vena cava is normal in size with greater than 50% respiratory variability, suggesting right atrial pressure of 3 mmHg.  7. Agitated saline contrast bubble study was negative, with no evidence of any interatrial shunt. Conclusion(s)/Recommendation(s): No intracardiac source of embolism detected on this transthoracic study. A  transesophageal echocardiogram is recommended to exclude cardiac source of embolism if clinically indicated. FINDINGS  Left Ventricle: Left ventricular ejection fraction, by estimation, is 55 to 60%. The left ventricle has normal function. The left ventricle has no regional wall motion abnormalities. The left ventricular internal cavity size was normal in size. There is  no left ventricular hypertrophy. Left ventricular diastolic function could not be evaluated due to nondiagnostic images. Left ventricular diastolic function could not be evaluated. Right Ventricle: The right ventricular size is normal. No increase in right ventricular wall thickness. Right ventricular systolic function is normal. Left Atrium: Left atrial size was normal in size. Right Atrium: Right atrial size was normal in size. Pericardium: Trivial pericardial effusion is present. Mitral Valve: The mitral valve is grossly normal. No evidence of mitral valve regurgitation. No evidence of mitral valve stenosis. Tricuspid Valve: The tricuspid valve is grossly normal. Tricuspid valve regurgitation is trivial. No evidence of tricuspid stenosis. Aortic Valve: The aortic valve is grossly normal. Aortic valve regurgitation is not visualized. No aortic stenosis is present. Pulmonic Valve: The pulmonic valve was grossly normal. Pulmonic valve regurgitation is not visualized. No evidence of pulmonic stenosis. Aorta: The aortic root is normal in size and structure. Venous: The inferior vena cava is normal in size with greater than 50% respiratory variability, suggesting right atrial pressure of 3 mmHg. IAS/Shunts: No atrial level shunt detected by color flow Doppler. Agitated saline contrast was given intravenously to evaluate for intracardiac shunting. Agitated saline contrast bubble study was negative, with no evidence of any interatrial shunt.  LEFT VENTRICLE PLAX 2D LVIDd:         3.80 cm  Diastology LVIDs:         2.50 cm  LV e' lateral: 6.20 cm/s LV PW:          1.10 cm  LV e' medial:  4.57 cm/s LV IVS:        1.00 cm LVOT diam:     2.10 cm LV SV:         48 LV SV Index:   26 LVOT Area:     3.46 cm  RIGHT VENTRICLE RV S prime:     9.46 cm/s TAPSE (M-mode): 1.8 cm LEFT ATRIUM  Index       RIGHT ATRIUM           Index LA diam:      3.30 cm 1.78 cm/m  RA Area:     19.00 cm LA Vol (A4C): 50.4 ml 27.18 ml/m RA Volume:   57.90 ml  31.26 ml/m  AORTIC VALVE LVOT Vmax:   64.00 cm/s LVOT Vmean:  50.200 cm/s LVOT VTI:    0.140 m  AORTA Ao Root diam: 3.60 cm  SHUNTS Systemic VTI:  0.14 m Systemic Diam: 2.10 cm Eleonore Chiquito MD Electronically signed by Eleonore Chiquito MD Signature Date/Time: 07/01/2019/3:25:46 PM    Final    VAS US CAROTID  Result Date: 07/02/2019 Carotid Arterial Duplex Study Indications:       CVA and follow up possible carotid web versus thrombus seen                    on CT. Comparison Study:  No prior study Performing Technologist: Maudry Mayhew MHA, RDMS, RVT, RDCS  Examination Guidelines: A complete evaluation includes B-mode imaging, spectral Doppler, color Doppler, and power Doppler as needed of all accessible portions of each vessel. Bilateral testing is considered an integral part of a complete examination. Limited examinations for reoccurring indications may be performed as noted.  Right Carotid Findings: +----------+--------+--------+--------+------------------+--------+           PSV cm/sEDV cm/sStenosisPlaque DescriptionComments +----------+--------+--------+--------+------------------+--------+ CCA Prox  51      14                                         +----------+--------+--------+--------+------------------+--------+ CCA Distal44      16                                         +----------+--------+--------+--------+------------------+--------+ ICA Prox  17      4                                          +----------+--------+--------+--------+------------------+--------+ ICA Distal59      26                                          +----------+--------+--------+--------+------------------+--------+ ECA       44      14                                         +----------+--------+--------+--------+------------------+--------+   Summary: Right Carotid: Velocities in the right ICA are consistent with a 1-39% stenosis.                There is a heterogenous mobile area of the anterior vessel wall                at the carotid bulb/proximal ICA (Images 16-19). Cannot                differentiate between possible carotid web versus thrombus versus  unknown etiology.  *See table(s) above for measurements and observations.  Electronically signed by Antony Contras MD on 07/02/2019 at 53:44:11 AM.   Final    VAS [place] LOWER EXTREMITY VENOUS (DVT)  Result Date: 07/02/2019  Lower Venous DVTStudy Indications: Stroke.  Risk Factors: None identified. Comparison Study: No prior studies. Performing Technologist: Oliver Hum RVT  Examination Guidelines: A complete evaluation includes B-mode imaging, spectral Doppler, color Doppler, and power Doppler as needed of all accessible portions of each vessel. Bilateral testing is considered an integral part of a complete examination. Limited examinations for reoccurring indications may be performed as noted. The reflux portion of the exam is performed with the patient in reverse Trendelenburg.  +---------+---------------+---------+-----------+----------+--------------+ RIGHT    CompressibilityPhasicitySpontaneityPropertiesThrombus Aging +---------+---------------+---------+-----------+----------+--------------+ CFV      Full           Yes      Yes                                 +---------+---------------+---------+-----------+----------+--------------+ SFJ      Full                                                        +---------+---------------+---------+-----------+----------+--------------+ FV Prox  Full                                                         +---------+---------------+---------+-----------+----------+--------------+ FV Mid   Full                                                        +---------+---------------+---------+-----------+----------+--------------+ FV DistalFull                                                        +---------+---------------+---------+-----------+----------+--------------+ PFV      Full                                                        +---------+---------------+---------+-----------+----------+--------------+ POP      Full           Yes      Yes                                 +---------+---------------+---------+-----------+----------+--------------+ PTV      Full                                                        +---------+---------------+---------+-----------+----------+--------------+  PERO     Full                                                        +---------+---------------+---------+-----------+----------+--------------+   +---------+---------------+---------+-----------+----------+--------------+ LEFT     CompressibilityPhasicitySpontaneityPropertiesThrombus Aging +---------+---------------+---------+-----------+----------+--------------+ CFV      Full           Yes      Yes                                 +---------+---------------+---------+-----------+----------+--------------+ SFJ      Full                                                        +---------+---------------+---------+-----------+----------+--------------+ FV Prox  Full                                                        +---------+---------------+---------+-----------+----------+--------------+ FV Mid   Full                                                        +---------+---------------+---------+-----------+----------+--------------+ FV DistalFull                                                         +---------+---------------+---------+-----------+----------+--------------+ PFV      Full                                                        +---------+---------------+---------+-----------+----------+--------------+ POP      Full           Yes      Yes                                 +---------+---------------+---------+-----------+----------+--------------+ PTV      Full                                                        +---------+---------------+---------+-----------+----------+--------------+ PERO     Full                                                        +---------+---------------+---------+-----------+----------+--------------+  Summary: RIGHT: - There is no evidence of deep vein thrombosis in the lower extremity.  - No cystic structure found in the popliteal fossa.  LEFT: - There is no evidence of deep vein thrombosis in the lower extremity.  - No cystic structure found in the popliteal fossa.  *See table(s) above for measurements and observations.    Preliminary     PHYSICAL EXAM    Temp:  [97.4 F (36.3 C)-98.7 F (37.1 C)] 98.3 F (36.8 C) (04/22 1209) Pulse Rate:  [60-101] 78 (04/22 1209) Resp:  [14-21] 19 (04/22 1209) BP: (130-176)/(97-132) 149/118 (04/22 1209) SpO2:  [94 %-100 %] 94 % (04/22 1209) Weight:  [61.4 kg] 61.4 kg (04/22 0315)  General - Well nourished, well developed, not in acute distress  Ophthalmologic - fundi not visualized due to noncooperation.  Cardiovascular - Regular rhythm and rate.  Neuro - awake alert, orientated to place, time, age and people. No aphasia, able to name and repeat but moderate to severe dysarthria. PERRL, EOMI, visual field full, left facial droop, tongue midline. Left UE deltoid 2+/5, bicep 3/5, tricep 1/5 and finger grip 2/t. left LE 2+/5 proximal and 2/5 distally. RUE 5/5 and RLE 4/5. Sensation decreased on the left, about 50% of the right. Right FTN intact. Gait not  tested.   ASSESSMENT/PLAN [name] is a 69 y.o. male with history of HTN, HLD, tobacco and alcohol abuse presenting with L sided weakness.   Stroke:   Multiple scattered anterior and posterior R brain infarcts embolic secondary to unclear source  Code Stroke CT head No acute abnormality. Chronic Small vessel disease. Atrophy. ASPECTS 10.     CTA head & neck no ELVO. Aortic atherosclerosis. 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  Subtle R ICA bulb indistinct filling defect possible small carotid web vs less likely thrombus.  CT perfusion R frontal periventricular white matter and L cerebellar artifact/pseudonormalization. No core infarct.   MRI  Multiple scattered posterior R basal ganglia, R cerebral, R cerebellar infarcts.  Small vessel disease. Atrophy. Multiple old lacunes B basal ganglia, thalami, pons and L cerebellar infarct   Carotid Doppler R ICA bulb mobile area, ? Thrombus vs web  2D Echo EF 55-60%. No source of embolus   LE venous doppler no DVT  Will recommend loop recorder to rule out afib   LDL 109  HgbA1c 6.2  Lovenox 40 mg sq daily for VTE prophylaxis  No antithrombotic prior to admission, now on aspirin 325 mg daily and clopidogrel 75 mg daily for 3 months and then ASA alone.   Therapy recommendations:  pending   Disposition:  pending   Hypertensive Urgency Hypertension  BP as high as 186/125 on arrival  BP stale on the high end . Gradually lower BP to goal . Long-term BP goal normotensive  Hyperlipidemia  Home meds:  lipitor 40 (not compliant), resumed in hospital  LDL 109, goal < 70  Continue statin at discharge  Right carotid filling defect - ? web vs. thrombus  CTA head and neck concerning for subtle right carotid filling defect  CUS R ICA bulb mobile area, ? Thrombus vs web  On DAPT now, discussed with Dr. Leonie Man, will continue DAPT for 3 months and then repeat CTA neck and CUS for further evaluation.   Alcohol abuse  As per wife, pt  drinks "a lot"   On CIWA protocol  On FA/B1/MVI  Alcohol withdraw precautions  advised to drink no more than 2 drink(s) a day  Tobacco abuse  Current smoker, 1/2PPD  Smoking cessation counseling provided  Pt is willing to quit  Dysphagia   On dys 2 diet  Moderate to severe dysarthria  Intermittent coughing  Speech on board  Other Stroke Risk Factors  Advanced age  Other Active Problems  Polycythemia HCT 19.2-20.1-18.0-18.1 - likely due to smoker - will check JAK2 mutation   Hospital day # 2  Rosalin Hawking, MD PhD Stroke Neurology 07/02/2019 12:15 PM  To contact Stroke Continuity provider, please refer to [email or web address] After hours, contact General Neurology

## 2019-07-03 ENCOUNTER — Encounter (HOSPITAL_COMMUNITY)
Admission: EM | Disposition: A | Discharge status: Rehabilitation Facility | Payer: Self-pay | Source: Home / Self Care | Attending: Internal Medicine

## 2019-07-03 DIAGNOSIS — I6389 Other cerebral infarction: Secondary | ICD-10-CM

## 2019-07-03 DIAGNOSIS — I69391 Dysphagia following cerebral infarction: Secondary | ICD-10-CM

## 2019-07-03 DIAGNOSIS — R7303 Prediabetes: Secondary | ICD-10-CM

## 2019-07-03 DIAGNOSIS — F1010 Alcohol abuse, uncomplicated: Secondary | ICD-10-CM

## 2019-07-03 DIAGNOSIS — I639 Cerebral infarction, unspecified: Secondary | ICD-10-CM

## 2019-07-03 DIAGNOSIS — D751 Secondary polycythemia: Secondary | ICD-10-CM

## 2019-07-03 DIAGNOSIS — Z720 Tobacco use: Secondary | ICD-10-CM

## 2019-07-03 DIAGNOSIS — E785 Hyperlipidemia, unspecified: Secondary | ICD-10-CM

## 2019-07-03 DIAGNOSIS — I10 Essential (primary) hypertension: Secondary | ICD-10-CM

## 2019-07-03 HISTORY — PX: LOOP RECORDER INSERTION: EP1214

## 2019-07-03 LAB — BASIC METABOLIC PANEL
Anion gap: 10 (ref 5–15)
BUN: <5 mg/dL — ABNORMAL LOW (ref 8–23)
CO2: 24 mmol/L (ref 22–32)
Calcium: 8.9 mg/dL (ref 8.9–10.3)
Chloride: 101 mmol/L (ref 98–111)
Creatinine, Ser: 1.02 mg/dL (ref 0.61–1.24)
GFR calc Af Amer: >60 mL/min (ref >60)
GFR calc non Af Amer: >60 mL/min (ref >60)
Glucose, Bld: 92 mg/dL (ref 70–99)
Potassium: 3.7 mmol/L (ref 3.5–5.1)
Sodium: 135 mmol/L (ref 135–145)

## 2019-07-03 SURGERY — LOOP RECORDER INSERTION

## 2019-07-03 MED ORDER — AMLODIPINE BESYLATE 5 MG PO TABS
5.0000 mg | ORAL_TABLET | Freq: Every day | ORAL | Status: DC
  Administered 2019-07-03 – 2019-07-06 (×4): 5 mg via ORAL
  Filled 2019-07-03 (×4): qty 1

## 2019-07-03 MED ORDER — LIDOCAINE-EPINEPHRINE 1 %-1:100000 IJ SOLN
INTRAMUSCULAR | Status: DC | PRN
  Administered 2019-07-03: 20 mL

## 2019-07-03 SURGICAL SUPPLY — 2 items
MONITOR REVEAL LINQ II (Prosthesis & Implant Heart) ×1 IMPLANT
PACK LOOP INSERTION (CUSTOM PROCEDURE TRAY) ×2 IMPLANT

## 2019-07-03 NOTE — Progress Notes (Signed)
  Speech Language Pathology Treatment: Dysphagia  Patient Details Name: Russell Russell MRN: [id number] DOB: 03/08/1951 Today's Date: 07/03/2019 Time: 0910-0930 SLP Time Calculation (min) (ACUTE ONLY): 20 min  Assessment / Plan / Recommendation Clinical Impression  Assisted pt with am meal. Demonstrated importance of oral care and conscientious self feeding. SLP provided total assist for positioning, verbal cues in 50% of trials for improved proprioception with placement of spoon in mouth. Pt needed verbal cues in 75% of trials to take single straw sips, which assisted in bolus clearance and did not elicit coughing as long a rate was slow and controlled. SLP modeled strategies to clear oral cavity after a meal (pressure to cheek, oral suction) which pt verbalized after a 5 minute delay. Pt would benefit from ongoing SLP interventions in CIR setting. Will f/u for needs.    HPI HPI: [name] is a 69 y.o. male with history of HTN, HLD, tobacco and alcohol abuse presenting with L sided weakness. Pt has significant dysarthria and left sided weakness. MRI showed multifocal right hemisphere infarcts and right cerebellar small infarcts. Embolic pattern. However, CTA head and neck as well as CUS concerning for right ICA bulb questionable filling defect.       SLP Plan  Continue with current plan of care       Recommendations  Diet recommendations: Dysphagia 2 (fine chop);Thin liquid Liquids provided via: Straw Medication Administration: Whole meds with puree Supervision: Staff to assist with self feeding;Trained caregiver to feed patient Compensations: Slow rate;Small sips/bites;Lingual sweep for clearance of pocketing;Monitor for anterior loss;Follow solids with liquid Postural Changes and/or Swallow Maneuvers: Seated upright 90 degrees                Plan: Continue with current plan of care       GO                Russell Russell, Katherene Ponto 07/03/2019, 11:28 AM

## 2019-07-03 NOTE — Progress Notes (Addendum)
STROKE TEAM PROGRESS NOTE   INTERVAL HISTORY Wife at bedside. Pt sitting in bed, state post Loop recorder placement. Still has significant left facial droop, left sided hemiparesis. Pending CIR.    Vitals:   07/03/19 0007 07/03/19 0357 07/03/19 0746 07/03/19 1202  BP: (!) 160/129 (!) 152/108 (!) 160/122 (!) 144/107  Pulse: 84 83 80 (!) 102  Resp: 16 18 19 20   Temp: 98.9 F (37.2 C) 98.4 F (36.9 C) 98.1 F (36.7 C) 98.5 F (36.9 C)  TempSrc: Oral Oral Axillary Oral  SpO2: 98% 93% 90% 90%  Weight:  63.2 kg    Height:       CBC:  Recent Labs  Lab 06/30/19 1919 06/30/19 1926 07/01/19 0315 07/02/19 0636  WBC 4.6   < > 3.4* 4.1  NEUTROABS 3.4  --   --   --   HGB 19.2*   < > 18.0* 18.1*  HCT 54.7*   < > 52.3* 52.1*  MCV 91.8   < > 92.2 92.7  PLT 190   < > 216 190   < > = values in this interval not displayed.   Basic Metabolic Panel:  Recent Labs  Lab 07/01/19 0315 07/01/19 0315 07/02/19 0636 07/03/19 0703  NA 138   < > 136 135  K 3.5   < > 3.9 3.7  CL 102   < > 102 101  CO2 19*   < > 24 24  GLUCOSE 133*   < > 101* 92  BUN 6*   < > <5* <5*  CREATININE 0.98   < > 0.94 1.02  CALCIUM 9.1   < > 8.8* 8.9  MG 1.7  --   --   --   PHOS  --   --  3.4  --    < > = values in this interval not displayed.   Lipid Panel:     Component Value Date/Time   CHOL 196 07/01/2019 0315   TRIG 52 07/01/2019 0315   HDL 77 07/01/2019 0315   CHOLHDL 2.5 07/01/2019 0315   VLDL 10 07/01/2019 0315   LDLCALC 109 (H) 07/01/2019 0315   HgbA1c:  Lab Results  Component Value Date   HGBA1C 6.2 (H) 07/01/2019   Urine Drug Screen:     Component Value Date/Time   LABOPIA NONE DETECTED 07/01/2019 0400   COCAINSCRNUR NONE DETECTED 07/01/2019 0400   LABBENZ NONE DETECTED 07/01/2019 0400   AMPHETMU NONE DETECTED 07/01/2019 0400   THCU NONE DETECTED 07/01/2019 0400   LABBARB NONE DETECTED 07/01/2019 0400    Alcohol Level     Component Value Date/Time   ETH 18 (H) 06/30/2019 1924     IMAGING past 24 hours No results found.  PHYSICAL EXAM  Temp:  [97.9 F (36.6 C)-98.9 F (37.2 C)] 98.5 F (36.9 C) (04/23 1202) Pulse Rate:  [79-102] 102 (04/23 1202) Resp:  [16-20] 20 (04/23 1202) BP: (138-160)/(107-129) 144/107 (04/23 1202) SpO2:  [90 %-98 %] 90 % (04/23 1202) Weight:  [63.2 kg] 63.2 kg (04/23 0357)  General - Well nourished, well developed, not in acute distress  Ophthalmologic - fundi not visualized due to noncooperation.  Cardiovascular - Regular rhythm and rate.  Neuro - awake alert, orientated to place, time, age and people. No aphasia, able to name and repeat but moderate to severe dysarthria. PERRL, EOMI, visual field full, left facial droop, tongue midline. Left UE deltoid 2+/5, bicep 3/5, tricep 1/5 and finger grip 2/t. left LE 2+/5 proximal and  2/5 distally. RUE 5/5 and RLE 4/5. Sensation decreased on the left, about 50% of the right. Right FTN intact. Gait not tested.   ASSESSMENT/PLAN [name] is a 69 y.o. male with history of HTN, HLD, tobacco and alcohol abuse presenting with L sided weakness.   Stroke:   Multiple scattered anterior and posterior R brain infarcts embolic secondary to unclear source  Code Stroke CT head No acute abnormality. Chronic Small vessel disease. Atrophy. ASPECTS 10.     CTA head & neck no ELVO. Aortic atherosclerosis. 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  Subtle R ICA bulb indistinct filling defect possible small carotid web vs less likely thrombus.  CT perfusion R frontal periventricular white matter and L cerebellar artifact/pseudonormalization. No core infarct.   MRI  Multiple scattered posterior R basal ganglia, R cerebral, R cerebellar infarcts.  Small vessel disease. Atrophy. Multiple old lacunes B basal ganglia, thalami, pons and L cerebellar infarct   Carotid Doppler R ICA bulb mobile area, ? Thrombus vs web  2D Echo EF 55-60%. No source of embolus   LE venous doppler no DVT  Loop recorder  placed  LDL 109  HgbA1c 6.2  Lovenox 40 mg sq daily for VTE prophylaxis  No antithrombotic prior to admission, now on aspirin 325 mg daily and clopidogrel 75 mg daily for 3 months and then ASA alone.   Therapy recommendations:  CIR  Disposition:  pending   Hypertensive Urgency Hypertension  BP as high as 186/125 on arrival  BP stale on the high end . Gradually lower BP to goal . Long-term BP goal normotensive  Hyperlipidemia  Home meds:  lipitor 40 (not compliant), resumed in hospital  LDL 109, goal < 70  Continue statin at discharge  Right carotid filling defect - ? web vs. thrombus  CTA head and neck concerning for subtle right carotid filling defect  CUS R ICA bulb mobile area, ? Thrombus vs web  On DAPT now, discussed with Dr. Leonie Man, will continue DAPT for 3 months and then repeat CTA neck and CUS for further evaluation.   Alcohol abuse  As per wife, pt drinks "a lot"   On CIWA protocol  On FA/B1/MVI  Alcohol withdraw precautions  advised to drink no more than 2 drink(s) a day  Tobacco abuse  Current smoker, 1/2PPD  Smoking cessation counseling provided  Pt is willing to quit  Dysphagia   On dys 2 diet thin liq  Moderate to severe dysarthria  Intermittent coughing  Speech on board  Other Stroke Risk Factors  Advanced age  Other Active Problems  Polycythemia HCT 19.2-20.1-18.0-18.1 - likely due to smoker - [facility] day # 3  Neurology will sign off. Please call with questions. Pt will follow up with Dr. Leonie Man at stroke clinic at [facility] in about 4 weeks. Thanks for the consult.   Russell Hawking, MD PhD Stroke Neurology 07/03/2019 12:18 PM  To contact Stroke Continuity provider, please refer to [email or web address] After hours, contact General Neurology

## 2019-07-03 NOTE — H&P (View-Only) (Signed)
ELECTROPHYSIOLOGY CONSULT NOTE  Patient ID: Russell Russell MRN: [id number], DOB/AGE: 09-11-1950   Admit date: 06/30/2019 Date of Consult: 07/03/2019  Primary Physician: Inda Coke, Bessie Primary Cardiologist: new to [facility] Reason for Consultation: Cryptogenic stroke; recommendations regarding Implantable Loop Recorder  History of Present Illness EP has been asked to evaluate Russell Russell for placement of an implantable loop recorder to monitor for atrial fibrillation by Dr Erlinda Hong.  The patient was admitted on 06/30/2019 with left sided weakness.  Imaging demonstrated multiple scattered anterior and posterior R brain infarcts felt to be embolic 2/2 unknown source.  He has undergone workup for stroke including echocardiogram and carotid imaging.  Carotid imaging demonstrated possible small carotid web vs less likely thrombus.  The patient has been monitored on telemetry which has demonstrated sinus rhythm with no arrhythmias.   Echocardiogram this admission demonstrated EF 55-60%, LA 33.  Lab work is reviewed.  Prior to admission, the patient denies chest pain, shortness of breath, dizziness, palpitations, or syncope.  They are recovering from their stroke with plans to go to CIR at discharge.    Past Medical History:  Diagnosis Date  . Alcohol abuse    drinks 1 gallon of Gin every weekend, nothing during the week x >15 yrs  . Elevated transaminase level    +elev bili: abd u/s 06/2014 showed stable small hepatic hemangiomas, o/w normal.  . Essential hypertension   . GERD (gastroesophageal reflux disease)   . Hyperlipidemia 03/2014   Atorv started-chol improved  . Impaired fasting glucose 03/2014  . Nephrolithiasis   . PUD (peptic ulcer disease)   . Tobacco dependence    chantix: "psych effects"     Surgical History:  Past Surgical History:  Procedure Laterality Date  . COLONOSCOPY  2005   Recall 10 yrs ([place])  . removal of kidney stones  2006   cystoscopic--removed  from ureter.  No prob since.     Medications Prior to Admission  Medication Sig Dispense Refill Last Dose  . atorvastatin (LIPITOR) 40 MG tablet Take 1 tablet (40 mg total) by mouth daily. 90 tablet 1 Past Month at Unknown time  . amLODipine (NORVASC) 10 MG tablet Take 1 tablet (10 mg total) by mouth daily. (Patient not taking: Reported on 06/30/2019) 30 tablet 1 Not Taking at Unknown time  . halobetasol (ULTRAVATE) 0.05 % cream Apply topically 2 (two) times daily. (Patient not taking: Reported on 06/30/2019) 50 g 3 Not Taking at Unknown time    Inpatient Medications:  . amLODipine  5 mg Oral Daily  . aspirin  300 mg Rectal Daily   Or  . aspirin  325 mg Oral Daily  . atorvastatin  40 mg Oral Daily  . chlorhexidine  15 mL Mouth Rinse BID  . clopidogrel  75 mg Oral Daily  . enoxaparin (LOVENOX) injection  40 mg Subcutaneous Q24H  . feeding supplement (ENSURE ENLIVE)  237 mL Oral BID BM  . folic acid  1 mg Intravenous Daily  . LORazepam  0-4 mg Intravenous Q12H  . mouth rinse  15 mL Mouth Rinse q12n4p  . multivitamin with minerals  1 tablet Oral Daily  . pantoprazole  40 mg Oral Daily  . thiamine  100 mg Oral Daily   Or  . thiamine  100 mg Intravenous Daily    Allergies:  Allergies  Allergen Reactions  . Penicillins Rash    Social History   Socioeconomic History  . Marital status: Married    Spouse name:  Not on file  . Number of children: Not on file  . Years of education: Not on file  . Highest education level: Not on file  Occupational History  . Not on file  Tobacco Use  . Smoking status: Current Every Day Smoker    Packs/day: 0.50    Years: 20.00    Pack years: 10.00    Types: Cigarettes  . Smokeless tobacco: Never Used  Substance and Sexual Activity  . Alcohol use: Yes    Alcohol/week: 8.0 standard drinks    Types: 8 Cans of beer per week    Comment: Weekends only  . Drug use: No  . Sexual activity: Not on file  Other Topics Concern  . Not on file  Social  History Narrative   Married, no children.   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   Tob: 10 pack-yr hx (current as of 03/2014).   Alcohol: 8 beers and pint of whisky on weekends only.   Denies hx of prob with drugs or alcohol.   Social Determinants of Health   Financial Resource Strain:   . Difficulty of Paying Living Expenses:   Food Insecurity:   . Worried About Charity fundraiser in the Last Year:   . Arboriculturist in the Last Year:   Transportation Needs:   . Film/video editor (Medical):   Marland Kitchen Lack of Transportation (Non-Medical):   Physical Activity:   . Days of Exercise per Week:   . Minutes of Exercise per Session:   Stress:   . Feeling of Stress :   Social Connections:   . Frequency of Communication with Friends and Family:   . Frequency of Social Gatherings with Friends and Family:   . Attends Religious Services:   . Active Member of Clubs or Organizations:   . Attends Archivist Meetings:   Marland Kitchen Marital Status:   Intimate Partner Violence:   . Fear of Current or Ex-Partner:   . Emotionally Abused:   Marland Kitchen Physically Abused:   . Sexually Abused:      Family History  Problem Relation Age of Onset  . Colon cancer Mother   . Cancer Father   . Breast cancer Sister   . Breast cancer Sister       Review of Systems: All other systems reviewed and are otherwise negative except as noted above.  Physical Exam: Vitals:   07/02/19 1755 07/03/19 0007 07/03/19 0357 07/03/19 0746  BP:  (!) 160/129 (!) 152/108 (!) 160/122  Pulse: 79 84 83 80  Resp: 19 16 18 19   Temp:  98.9 F (37.2 C) 98.4 F (36.9 C) 98.1 F (36.7 C)  TempSrc:  Oral Oral Axillary  SpO2: 96% 98% 93% 90%  Weight:   63.2 kg   Height:        GEN- The patient is elderly appearing, alert and oriented x 3 today.   Head- normocephalic, atraumatic Eyes-  Sclera clear, conjunctiva pink Ears- hearing intact Oropharynx- clear Neck-  supple Lungs- Clear to ausculation bilaterally, normal work of breathing Heart- Regular rate and rhythm  GI- soft, NT, ND, + BS Extremities- no clubbing, cyanosis, or edema, +LUE contracture MS- no significant deformity or atrophy Skin- no rash or lesion Psych- euthymic mood, full affect   Labs:   Lab Results  Component Value Date   WBC 4.1 07/02/2019   HGB 18.1 (H) 07/02/2019   HCT 52.1 (H) 07/02/2019  MCV 92.7 07/02/2019   PLT 190 07/02/2019    Recent Labs  Lab 06/30/19 1919 06/30/19 1926 07/02/19 0636  NA 136   < > 136  K 4.0   < > 3.9  CL 101   < > 102  CO2 23   < > 24  BUN 7*   < > <5*  CREATININE 1.16   < > 0.94  CALCIUM 9.4   < > 8.8*  PROT 7.3  --   --   BILITOT 1.4*  --   --   ALKPHOS 100  --   --   ALT 29  --   --   AST 53*  --   --   GLUCOSE 134*   < > 101*   < > = values in this interval not displayed.     Radiology/Studies: CT Code Stroke CTA Head W/WO contrast  Addendum Date: 07/01/2019   ADDENDUM REPORT: 07/01/2019 09:49 ADDENDUM: Study discussed by telephone with Dr. Erlinda Hong on 07/01/2019 at 0930 hours. There is subtle heterogeneity at the lateral right ICA bulb (series 4, image 108) suggesting a small, indistinct filling defect in the vessel. Perhaps this is a small carotid web, or less likely adherent thrombus, with some flow related motion. We discussed that I think Carotid Doppler Ultrasound focused on the Right ICA bulb would best evaluate these possibilities. Electronically Signed   By: Genevie Ann M.D.   On: 07/01/2019 09:49   Result Date: 07/01/2019 CLINICAL DATA:  Left-sided weakness EXAM: CT ANGIOGRAPHY HEAD AND NECK CT PERFUSION BRAIN TECHNIQUE: Multidetector CT imaging of the head and neck was performed using the standard protocol during bolus administration of intravenous contrast. Multiplanar CT image reconstructions and MIPs were obtained to evaluate the vascular anatomy. Carotid stenosis measurements (when applicable) are obtained utilizing NASCET  criteria, using the distal internal carotid diameter as the denominator. Multiphase CT imaging of the brain was performed following IV bolus contrast injection. Subsequent parametric perfusion maps were calculated using RAPID software. CONTRAST:  196mL OMNIPAQUE IOHEXOL 350 MG/ML SOLN COMPARISON:  None. FINDINGS: CTA NECK FINDINGS SKELETON: There is no bony spinal canal stenosis. No lytic or blastic lesion. OTHER NECK: Normal pharynx, larynx and major salivary glands. No cervical lymphadenopathy. Unremarkable thyroid gland. UPPER CHEST: No pneumothorax or pleural effusion. No nodules or masses. AORTIC ARCH: There is mild calcific atherosclerosis of the aortic arch. 3.6 cm diameter of descending thoracic aorta arch. Conventional 3 vessel aortic branching pattern. The visualized proximal subclavian arteries are widely patent. RIGHT CAROTID SYSTEM: Normal without aneurysm, dissection or stenosis. LEFT CAROTID SYSTEM: Normal without aneurysm, dissection or stenosis. VERTEBRAL ARTERIES: Right dominant configuration. Both origins are clearly patent. There is no dissection, occlusion or flow-limiting stenosis to the skull base (V1-V3 segments). CTA HEAD FINDINGS POSTERIOR CIRCULATION: --Vertebral arteries: Normal V4 segments. --Posterior inferior cerebellar arteries (PICA): Patent origins from the vertebral arteries. --Anterior inferior cerebellar arteries (AICA): Patent origins from the basilar artery. --Basilar artery: Normal. --Superior cerebellar arteries: Normal. --Posterior cerebral arteries: Normal. Both originate from the basilar artery. Posterior communicating arteries (p-comm) are diminutive or absent. ANTERIOR CIRCULATION: --Intracranial internal carotid arteries: Normal. --Anterior cerebral arteries (ACA): Normal. Both A1 segments are present. Patent anterior communicating artery (a-comm). --Middle cerebral arteries (MCA): Normal. VENOUS SINUSES: As permitted by contrast timing, patent. ANATOMIC VARIANTS: None  Review of the MIP images confirms the above findings. CT Brain Perfusion Findings: ASPECTS: 10 CBF (<30%) Volume: 26mL Perfusion (Tmax>6.0s) volume: 22mL Mismatch Volume: 51mL Infarction Location:Areas of elevated Tmax  are located in the right frontal periventricular white matter and left cerebellum. These regions correspond to areas of hypoattenuation on the noncontrast head CT, including an old left cerebellar infarct. IMPRESSION: 1. No emergent large vessel occlusion or high-grade stenosis of the intracranial arteries. 2. Area of ischemia identified in the right frontal periventricular white matter and left cerebellum, corresponding to the areas of hypoattenuation on the noncontrast head CT and possibly artifact/pseudonormalization. No core infarct by CT perfusion analysis. 3. Aortic Atherosclerosis (ICD10-I70.0). 4. 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 Recommend annual imaging followup by CTA or MRA. This recommendation follows 2010 ACCF/AHA/AATS/ACR/ASA/SCA/SCAI/SIR/STS/SVM Guidelines for the Diagnosis and Management of Patients with Thoracic Aortic Disease. Circulation.2010; [id number]. Aortic aneurysm NOS (ICD10-I71.9) Electronically Signed: By: Ulyses Jarred M.D. On: 06/30/2019 20:03   CT Code Stroke CTA Neck W/WO contrast  Addendum Date: 07/01/2019   ADDENDUM REPORT: 07/01/2019 09:49 ADDENDUM: Study discussed by telephone with Dr. Erlinda Hong on 07/01/2019 at 0930 hours. There is subtle heterogeneity at the lateral right ICA bulb (series 4, image 108) suggesting a small, indistinct filling defect in the vessel. Perhaps this is a small carotid web, or less likely adherent thrombus, with some flow related motion. We discussed that I think Carotid Doppler Ultrasound focused on the Right ICA bulb would best evaluate these possibilities. Electronically Signed   By: Genevie Ann M.D.   On: 07/01/2019 09:49   Result Date: 07/01/2019 CLINICAL DATA:  Left-sided weakness EXAM: CT ANGIOGRAPHY HEAD AND NECK CT PERFUSION BRAIN  TECHNIQUE: Multidetector CT imaging of the head and neck was performed using the standard protocol during bolus administration of intravenous contrast. Multiplanar CT image reconstructions and MIPs were obtained to evaluate the vascular anatomy. Carotid stenosis measurements (when applicable) are obtained utilizing NASCET criteria, using the distal internal carotid diameter as the denominator. Multiphase CT imaging of the brain was performed following IV bolus contrast injection. Subsequent parametric perfusion maps were calculated using RAPID software. CONTRAST:  141mL OMNIPAQUE IOHEXOL 350 MG/ML SOLN COMPARISON:  None. FINDINGS: CTA NECK FINDINGS SKELETON: There is no bony spinal canal stenosis. No lytic or blastic lesion. OTHER NECK: Normal pharynx, larynx and major salivary glands. No cervical lymphadenopathy. Unremarkable thyroid gland. UPPER CHEST: No pneumothorax or pleural effusion. No nodules or masses. AORTIC ARCH: There is mild calcific atherosclerosis of the aortic arch. 3.6 cm diameter of descending thoracic aorta arch. Conventional 3 vessel aortic branching pattern. The visualized proximal subclavian arteries are widely patent. RIGHT CAROTID SYSTEM: Normal without aneurysm, dissection or stenosis. LEFT CAROTID SYSTEM: Normal without aneurysm, dissection or stenosis. VERTEBRAL ARTERIES: Right dominant configuration. Both origins are clearly patent. There is no dissection, occlusion or flow-limiting stenosis to the skull base (V1-V3 segments). CTA HEAD FINDINGS POSTERIOR CIRCULATION: --Vertebral arteries: Normal V4 segments. --Posterior inferior cerebellar arteries (PICA): Patent origins from the vertebral arteries. --Anterior inferior cerebellar arteries (AICA): Patent origins from the basilar artery. --Basilar artery: Normal. --Superior cerebellar arteries: Normal. --Posterior cerebral arteries: Normal. Both originate from the basilar artery. Posterior communicating arteries (p-comm) are diminutive or  absent. ANTERIOR CIRCULATION: --Intracranial internal carotid arteries: Normal. --Anterior cerebral arteries (ACA): Normal. Both A1 segments are present. Patent anterior communicating artery (a-comm). --Middle cerebral arteries (MCA): Normal. VENOUS SINUSES: As permitted by contrast timing, patent. ANATOMIC VARIANTS: None Review of the MIP images confirms the above findings. CT Brain Perfusion Findings: ASPECTS: 10 CBF (<30%) Volume: 23mL Perfusion (Tmax>6.0s) volume: 76mL Mismatch Volume: 58mL Infarction Location:Areas of elevated Tmax are located in the right frontal periventricular white matter and left cerebellum.  These regions correspond to areas of hypoattenuation on the noncontrast head CT, including an old left cerebellar infarct. IMPRESSION: 1. No emergent large vessel occlusion or high-grade stenosis of the intracranial arteries. 2. Area of ischemia identified in the right frontal periventricular white matter and left cerebellum, corresponding to the areas of hypoattenuation on the noncontrast head CT and possibly artifact/pseudonormalization. No core infarct by CT perfusion analysis. 3. Aortic Atherosclerosis (ICD10-I70.0). 4. 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 Recommend annual imaging followup by CTA or MRA. This recommendation follows 2010 ACCF/AHA/AATS/ACR/ASA/SCA/SCAI/SIR/STS/SVM Guidelines for the Diagnosis and Management of Patients with Thoracic Aortic Disease. Circulation.2010; [id number]. Aortic aneurysm NOS (ICD10-I71.9) Electronically Signed: By: Ulyses Jarred M.D. On: 06/30/2019 20:03   MR BRAIN WO CONTRAST  Result Date: 07/01/2019 CLINICAL DATA:  Follow-up examination for acute stroke. EXAM: MRI HEAD WITHOUT CONTRAST TECHNIQUE: Multiplanar, multiecho pulse sequences of the brain and surrounding structures were obtained without intravenous contrast. COMPARISON:  Prior CTs from 06/30/2019. FINDINGS: Brain: Examination degraded by motion artifact. Generalized age-related cerebral atrophy.  Extensive patchy and confluent T2/FLAIR hyperintensity within the periventricular deep white matter both cerebral hemispheres as well as the pons, most consistent with chronic small vessel ischemic disease, fairly advanced in nature. Multiple superimposed remote lacunar infarcts seen about the bilateral basal ganglia, thalami, and pons. Chronic left cerebellar infarct noted as well. Approximate 2.5 cm linear focus of restricted diffusion seen extending from the posterior right lentiform nucleus into the posterior right caudate and corona radiata, consistent with an acute ischemic perforator type infarct (series 5, image 73). Few additional scattered ischemic infarcts seen involving the subcortical white matter of the overlying right frontal lobe (w series 5, image 83, 81). Additional subcentimeter acute ischemic infarct noted within the subcortical right occipital lobe (series 5, image 63). Approximate 1 cm acute ischemic infarcts seen involving the inferior right cerebellum (series 5, image 53). No definite associated hemorrhage about these infarcts. No associated mass effect. No mass lesion or midline shift. Mild ventricular prominence related to global parenchymal volume loss without hydrocephalus. No extra-axial fluid collection. Pituitary gland suprasellar region within normal limits. Midline structures intact. Vascular: Major intracranial vascular flow voids are maintained. Skull and upper cervical spine: Craniocervical junction within normal limits. Bone marrow signal intensity normal. No scalp soft tissue abnormality. Sinuses/Orbits: Globes and orbital soft tissues within normal limits. Mild scattered mucosal thickening noted throughout the paranasal sinuses. No mastoid effusion. Inner ear structures normal. Other: None. IMPRESSION: 1. Multiple scattered acute ischemic infarcts involving the posterior right basal ganglia, right cerebral hemisphere, and right cerebellum as above. No associated hemorrhage or  mass effect. 2. Underlying atrophy with advanced chronic microvascular ischemic disease, with multiple remote lacunar infarcts about the bilateral basal ganglia, thalami, and pons, with additional chronic left cerebellar infarct. Electronically Signed   By: Jeannine Boga M.D.   On: 07/01/2019 01:29   CT Code Stroke Cerebral Perfusion with contrast  Addendum Date: 07/01/2019   ADDENDUM REPORT: 07/01/2019 09:49 ADDENDUM: Study discussed by telephone with Dr. Erlinda Hong on 07/01/2019 at 0930 hours. There is subtle heterogeneity at the lateral right ICA bulb (series 4, image 108) suggesting a small, indistinct filling defect in the vessel. Perhaps this is a small carotid web, or less likely adherent thrombus, with some flow related motion. We discussed that I think Carotid Doppler Ultrasound focused on the Right ICA bulb would best evaluate these possibilities. Electronically Signed   By: Genevie Ann M.D.   On: 07/01/2019 09:49   Result Date: 07/01/2019 CLINICAL DATA:  Left-sided weakness EXAM: CT ANGIOGRAPHY HEAD AND NECK CT PERFUSION BRAIN TECHNIQUE: Multidetector CT imaging of the head and neck was performed using the standard protocol during bolus administration of intravenous contrast. Multiplanar CT image reconstructions and MIPs were obtained to evaluate the vascular anatomy. Carotid stenosis measurements (when applicable) are obtained utilizing NASCET criteria, using the distal internal carotid diameter as the denominator. Multiphase CT imaging of the brain was performed following IV bolus contrast injection. Subsequent parametric perfusion maps were calculated using RAPID software. CONTRAST:  158mL OMNIPAQUE IOHEXOL 350 MG/ML SOLN COMPARISON:  None. FINDINGS: CTA NECK FINDINGS SKELETON: There is no bony spinal canal stenosis. No lytic or blastic lesion. OTHER NECK: Normal pharynx, larynx and major salivary glands. No cervical lymphadenopathy. Unremarkable thyroid gland. UPPER CHEST: No pneumothorax or pleural  effusion. No nodules or masses. AORTIC ARCH: There is mild calcific atherosclerosis of the aortic arch. 3.6 cm diameter of descending thoracic aorta arch. Conventional 3 vessel aortic branching pattern. The visualized proximal subclavian arteries are widely patent. RIGHT CAROTID SYSTEM: Normal without aneurysm, dissection or stenosis. LEFT CAROTID SYSTEM: Normal without aneurysm, dissection or stenosis. VERTEBRAL ARTERIES: Right dominant configuration. Both origins are clearly patent. There is no dissection, occlusion or flow-limiting stenosis to the skull base (V1-V3 segments). CTA HEAD FINDINGS POSTERIOR CIRCULATION: --Vertebral arteries: Normal V4 segments. --Posterior inferior cerebellar arteries (PICA): Patent origins from the vertebral arteries. --Anterior inferior cerebellar arteries (AICA): Patent origins from the basilar artery. --Basilar artery: Normal. --Superior cerebellar arteries: Normal. --Posterior cerebral arteries: Normal. Both originate from the basilar artery. Posterior communicating arteries (p-comm) are diminutive or absent. ANTERIOR CIRCULATION: --Intracranial internal carotid arteries: Normal. --Anterior cerebral arteries (ACA): Normal. Both A1 segments are present. Patent anterior communicating artery (a-comm). --Middle cerebral arteries (MCA): Normal. VENOUS SINUSES: As permitted by contrast timing, patent. ANATOMIC VARIANTS: None Review of the MIP images confirms the above findings. CT Brain Perfusion Findings: ASPECTS: 10 CBF (<30%) Volume: 38mL Perfusion (Tmax>6.0s) volume: 40mL Mismatch Volume: 50mL Infarction Location:Areas of elevated Tmax are located in the right frontal periventricular white matter and left cerebellum. These regions correspond to areas of hypoattenuation on the noncontrast head CT, including an old left cerebellar infarct. IMPRESSION: 1. No emergent large vessel occlusion or high-grade stenosis of the intracranial arteries. 2. Area of ischemia identified in the right  frontal periventricular white matter and left cerebellum, corresponding to the areas of hypoattenuation on the noncontrast head CT and possibly artifact/pseudonormalization. No core infarct by CT perfusion analysis. 3. Aortic Atherosclerosis (ICD10-I70.0). 4. 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 Recommend annual imaging followup by CTA or MRA. This recommendation follows 2010 ACCF/AHA/AATS/ACR/ASA/SCA/SCAI/SIR/STS/SVM Guidelines for the Diagnosis and Management of Patients with Thoracic Aortic Disease. Circulation.2010; [id number]. Aortic aneurysm NOS (ICD10-I71.9) Electronically Signed: By: Ulyses Jarred M.D. On: 06/30/2019 20:03   ECHOCARDIOGRAM COMPLETE BUBBLE STUDY  Result Date: 07/01/2019    ECHOCARDIOGRAM REPORT   Patient Name:   Russell Russell Date of Exam: 07/01/2019 Medical Rec #:  [id number]      Height:       70.0 in Accession #:    [id number]     Weight:       151.0 lb Date of Birth:  [birth year]     BSA:          1.852 m Patient Age:    24 years       BP:           161/130 mmHg Patient Gender: M  HR:           83 bpm. Exam Location:  Inpatient Procedure: 2D Echo, Color Doppler, Cardiac Doppler and Saline Contrast Bubble            Study Indications:    Stroke i163.9  History:        Patient has no prior history of Echocardiogram examinations.                 Risk Factors:Hypertension and Dyslipidemia.  Sonographer:    Raquel Sarna Senior RDCS Referring Phys: Lawrence  Sonographer Comments: Technically difficult study due to poor echo windows, suboptimal parasternal window and suboptimal apical window. Technically difficult due to lung interference and respiratory variation. Patient coughing and moving thoughout exam. No parasternal, apical, or suprasternal window. IMPRESSIONS  1. Very poor study. All views basically obtained from the subcostal views.  2. Left ventricular ejection fraction, by estimation, is 55 to 60%. The left ventricle has normal function. The left ventricle has no  regional wall motion abnormalities. Left ventricular diastolic function could not be evaluated.  3. Right ventricular systolic function is normal. The right ventricular size is normal.  4. The mitral valve is grossly normal. No evidence of mitral valve regurgitation. No evidence of mitral stenosis.  5. The aortic valve is grossly normal. Aortic valve regurgitation is not visualized. No aortic stenosis is present.  6. The inferior vena cava is normal in size with greater than 50% respiratory variability, suggesting right atrial pressure of 3 mmHg.  7. Agitated saline contrast bubble study was negative, with no evidence of any interatrial shunt. Conclusion(s)/Recommendation(s): No intracardiac source of embolism detected on this transthoracic study. A transesophageal echocardiogram is recommended to exclude cardiac source of embolism if clinically indicated. FINDINGS  Left Ventricle: Left ventricular ejection fraction, by estimation, is 55 to 60%. The left ventricle has normal function. The left ventricle has no regional wall motion abnormalities. The left ventricular internal cavity size was normal in size. There is  no left ventricular hypertrophy. Left ventricular diastolic function could not be evaluated due to nondiagnostic images. Left ventricular diastolic function could not be evaluated. Right Ventricle: The right ventricular size is normal. No increase in right ventricular wall thickness. Right ventricular systolic function is normal. Left Atrium: Left atrial size was normal in size. Right Atrium: Right atrial size was normal in size. Pericardium: Trivial pericardial effusion is present. Mitral Valve: The mitral valve is grossly normal. No evidence of mitral valve regurgitation. No evidence of mitral valve stenosis. Tricuspid Valve: The tricuspid valve is grossly normal. Tricuspid valve regurgitation is trivial. No evidence of tricuspid stenosis. Aortic Valve: The aortic valve is grossly normal. Aortic valve  regurgitation is not visualized. No aortic stenosis is present. Pulmonic Valve: The pulmonic valve was grossly normal. Pulmonic valve regurgitation is not visualized. No evidence of pulmonic stenosis. Aorta: The aortic root is normal in size and structure. Venous: The inferior vena cava is normal in size with greater than 50% respiratory variability, suggesting right atrial pressure of 3 mmHg. IAS/Shunts: No atrial level shunt detected by color flow Doppler. Agitated saline contrast was given intravenously to evaluate for intracardiac shunting. Agitated saline contrast bubble study was negative, with no evidence of any interatrial shunt.  LEFT VENTRICLE PLAX 2D LVIDd:         3.80 cm  Diastology LVIDs:         2.50 cm  LV e' lateral: 6.20 cm/s LV PW:  1.10 cm  LV e' medial:  4.57 cm/s LV IVS:        1.00 cm LVOT diam:     2.10 cm LV SV:         48 LV SV Index:   26 LVOT Area:     3.46 cm  RIGHT VENTRICLE RV S prime:     9.46 cm/s TAPSE (M-mode): 1.8 cm LEFT ATRIUM           Index       RIGHT ATRIUM           Index LA diam:      3.30 cm 1.78 cm/m  RA Area:     19.00 cm LA Vol (A4C): 50.4 ml 27.18 ml/m RA Volume:   57.90 ml  31.26 ml/m  AORTIC VALVE LVOT Vmax:   64.00 cm/s LVOT Vmean:  50.200 cm/s LVOT VTI:    0.140 m  AORTA Ao Root diam: 3.60 cm  SHUNTS Systemic VTI:  0.14 m Systemic Diam: 2.10 cm Eleonore Chiquito MD Electronically signed by Eleonore Chiquito MD Signature Date/Time: 07/01/2019/3:25:46 PM    Final    CT HEAD CODE STROKE WO CONTRAST  Result Date: 06/30/2019 CLINICAL DATA:  Code stroke.  Left-sided weakness EXAM: CT HEAD WITHOUT CONTRAST TECHNIQUE: Contiguous axial images were obtained from the base of the skull through the vertex without intravenous contrast. COMPARISON:  None. FINDINGS: Brain: There is no mass, hemorrhage or extra-axial collection. There is generalized atrophy without lobar predilection. There is hypoattenuation of the periventricular white matter, most commonly indicating  chronic ischemic microangiopathy. Old left basal ganglia small vessel infarct. Vascular: No abnormal hyperdensity of the major intracranial arteries or dural venous sinuses. No intracranial atherosclerosis. Skull: The visualized skull base, calvarium and extracranial soft tissues are normal. Sinuses/Orbits: No fluid levels or advanced mucosal thickening of the visualized paranasal sinuses. No mastoid or middle ear effusion. The orbits are normal. ASPECTS [facility] Stroke Program Early CT Score) - Ganglionic level infarction (caudate, lentiform nuclei, internal capsule, insula, M1-M3 cortex): 7 - Supraganglionic infarction (M4-M6 cortex): 3 Total score (0-10 with 10 being normal): 10 IMPRESSION: 1. Chronic ischemic microangiopathy and generalized atrophy without acute intracranial abnormality. 2. ASPECTS is 10. * These results were communicated to Dr. Kerney Elbe at 7:30 pm on 06/30/2019 by text page via the [facility] messaging system. Electronically Signed   By: Ulyses Jarred M.D.   On: 06/30/2019 19:30   VAS US CAROTID  Result Date: 07/02/2019 Carotid Arterial Duplex Study Indications:       CVA and follow up possible carotid web versus thrombus seen                    on CT. Comparison Study:  No prior study Performing Technologist: Maudry Mayhew MHA, RDMS, RVT, RDCS  Examination Guidelines: A complete evaluation includes B-mode imaging, spectral Doppler, color Doppler, and power Doppler as needed of all accessible portions of each vessel. Bilateral testing is considered an integral part of a complete examination. Limited examinations for reoccurring indications may be performed as noted.  Right Carotid Findings: +----------+--------+--------+--------+------------------+--------+           PSV cm/sEDV cm/sStenosisPlaque DescriptionComments +----------+--------+--------+--------+------------------+--------+ CCA Prox  51      14                                          +----------+--------+--------+--------+------------------+--------+ CCA  Distal44      16                                         +----------+--------+--------+--------+------------------+--------+ ICA Prox  17      4                                          +----------+--------+--------+--------+------------------+--------+ ICA Distal59      26                                         +----------+--------+--------+--------+------------------+--------+ ECA       44      14                                         +----------+--------+--------+--------+------------------+--------+   Summary: Right Carotid: Velocities in the right ICA are consistent with a 1-39% stenosis.                There is a heterogenous mobile area of the anterior vessel wall                at the carotid bulb/proximal ICA (Images 16-19). Cannot                differentiate between possible carotid web versus thrombus versus                unknown etiology.  *See table(s) above for measurements and observations.  Electronically signed by Antony Contras MD on 07/02/2019 at 20:44:11 AM.   Final    VAS [place] LOWER EXTREMITY VENOUS (DVT)  Result Date: 07/02/2019  Lower Venous DVTStudy Indications: Stroke.  Risk Factors: None identified. Comparison Study: No prior studies. Performing Technologist: Oliver Hum RVT  Examination Guidelines: A complete evaluation includes B-mode imaging, spectral Doppler, color Doppler, and power Doppler as needed of all accessible portions of each vessel. Bilateral testing is considered an integral part of a complete examination. Limited examinations for reoccurring indications may be performed as noted. The reflux portion of the exam is performed with the patient in reverse Trendelenburg.  +---------+---------------+---------+-----------+----------+--------------+ RIGHT    CompressibilityPhasicitySpontaneityPropertiesThrombus Aging  +---------+---------------+---------+-----------+----------+--------------+ CFV      Full           Yes      Yes                                 +---------+---------------+---------+-----------+----------+--------------+ SFJ      Full                                                        +---------+---------------+---------+-----------+----------+--------------+ FV Prox  Full                                                        +---------+---------------+---------+-----------+----------+--------------+  FV Mid   Full                                                        +---------+---------------+---------+-----------+----------+--------------+ FV DistalFull                                                        +---------+---------------+---------+-----------+----------+--------------+ PFV      Full                                                        +---------+---------------+---------+-----------+----------+--------------+ POP      Full           Yes      Yes                                 +---------+---------------+---------+-----------+----------+--------------+ PTV      Full                                                        +---------+---------------+---------+-----------+----------+--------------+ PERO     Full                                                        +---------+---------------+---------+-----------+----------+--------------+   +---------+---------------+---------+-----------+----------+--------------+ LEFT     CompressibilityPhasicitySpontaneityPropertiesThrombus Aging +---------+---------------+---------+-----------+----------+--------------+ CFV      Full           Yes      Yes                                 +---------+---------------+---------+-----------+----------+--------------+ SFJ      Full                                                         +---------+---------------+---------+-----------+----------+--------------+ FV Prox  Full                                                        +---------+---------------+---------+-----------+----------+--------------+ FV Mid   Full                                                        +---------+---------------+---------+-----------+----------+--------------+  FV DistalFull                                                        +---------+---------------+---------+-----------+----------+--------------+ PFV      Full                                                        +---------+---------------+---------+-----------+----------+--------------+ POP      Full           Yes      Yes                                 +---------+---------------+---------+-----------+----------+--------------+ PTV      Full                                                        +---------+---------------+---------+-----------+----------+--------------+ PERO     Full                                                        +---------+---------------+---------+-----------+----------+--------------+     Summary: RIGHT: - There is no evidence of deep vein thrombosis in the lower extremity.  - No cystic structure found in the popliteal fossa.  LEFT: - There is no evidence of deep vein thrombosis in the lower extremity.  - No cystic structure found in the popliteal fossa.  *See table(s) above for measurements and observations. Electronically signed by Monica Martinez MD on 07/02/2019 at 8:09:03 PM.    Final     12-lead ECG ST, LAFB (personally reviewed) All prior EKG's in EPIC reviewed with no documented atrial fibrillation  Telemetry SR (personally reviewed)  Assessment and Plan:  1. Cryptogenic stroke The patient presents with cryptogenic stroke.    I spoke at length with the patient about monitoring for afib with an implantable loop recorder.  Risks, benefits, and alteratives to  implantable loop recorder were discussed with the patient today.  He would like to discuss further with his wife who is not currently at the bedside. Will check back later today.   Please call with questions.   Chanetta Marshall, NP 07/03/2019 8:10 AM  Addendum:  Discussed with wife at length - they would like to proceed with ILR implant. Will plan for later today  Chanetta Marshall, NP 07/03/2019 11:22 AM  I have seen, examined the patient, and reviewed the above assessment and plan.  Changes to above are made where necessary.  On exam, RRR.  The patient presents with cryptogenic stroke.  I agree with Dr Erlinda Hong that ILR is indicated to evaluate for afib as the possible cause for his stroke. Risks and benefits of ILR including but not limited to bleeding and infection were discussed with patient and wife who wish to proceed as above.  Co Sign: Thompson Grayer, MD 07/03/2019 12:39 PM

## 2019-07-03 NOTE — Progress Notes (Signed)
Inpatient Rehab Admissions:  Inpatient Rehab Consult received.  I met with patient and his wife at the bedside for rehabilitation assessment and to discuss goals and expectations of an inpatient rehab admission.  They are both hopeful for CIR; wife is retiring in June and will arrange for 24/7 for pt in the interim if needed at d/c from [place].  I have no beds available for this patient today.  Will f/u next week for possible admission pending bed availability.   Signed: Shann Medal, PT, DPT Admissions Coordinator [phone] 07/03/19  4:26 PM

## 2019-07-03 NOTE — Interval H&P Note (Signed)
History and Physical Interval Note:  07/03/2019 12:41 PM  Russell Russell  has presented today for surgery, with the diagnosis of stroke.  The various methods of treatment have been discussed with the patient and family. After consideration of risks, benefits and other options for treatment, the patient has consented to  Procedure(s): LOOP RECORDER INSERTION (N/A) as a surgical intervention.  The patient's history has been reviewed, patient examined, no change in status, stable for surgery.  I have reviewed the patient's chart and labs.  Questions were answered to the patient's satisfaction.     Thompson Grayer

## 2019-07-03 NOTE — Social Work (Signed)
CSW attempted to do sbirt 2x but was unable due to providers being in the room. CSW may attempt again at more appropriate times.   Emeterio Reeve, Latanya Presser, [place] Social Worker [phone]

## 2019-07-03 NOTE — TOC Initial Note (Signed)
Transition of Care [facility]) - Initial/Assessment Note    Patient Details  Name: Russell Russell MRN: [id number] Date of Birth: 02/03/1951  Transition of [facility]) CM/SW Contact:    Ralph Dowdy, [place] Work Phone Number: 07/03/2019, 11:40 AM  Clinical Narrative:                 CSW spoke with pt and wife at bedside to discuss discharge plans. CSW informed pt that CIR will do an assessment and see if pt qualifies for inpatient rehab here at the hospital. CSW stated that in case patient is not eligible for inpatient rehab, then a skilled nursing facility for rehab will be the next step. Pt and spouse agreed and that we would see what CIR said first.  Expected Discharge Plan: IP Rehab Facility Barriers to Discharge: Continued Medical Work up   Patient Goals and CMS Choice Patient states their goals for this hospitalization and ongoing recovery are:: To get better enough to walk by himself CMS Medicare.gov Compare Post Acute Care list provided to:: Patient Choice offered to / list presented to : Patient, Spouse  Expected Discharge Plan and Services Expected Discharge Plan: [place] In-house Referral: Clinical Social Work Discharge Planning Services: CM Consult Post Acute Care Choice: IP Rehab Living arrangements for the past 2 months: Single Family Home                                      Prior Living Arrangements/Services Living arrangements for the past 2 months: Single Family Home Lives with:: Spouse Patient language and need for interpreter reviewed:: Yes Do you feel safe going back to the place where you live?: Yes      Need for Family Participation in Patient Care: Yes (Comment) Care giver support system in place?: Yes (comment)   Criminal Activity/Legal Involvement Pertinent to Current Situation/Hospitalization: No - Comment as needed  Activities of Daily Living Home Assistive Devices/Equipment: None ADL Screening (condition at time of  admission) Patient's cognitive ability adequate to safely complete daily activities?: Yes Is the patient deaf or have difficulty hearing?: No Does the patient have difficulty seeing, even when wearing glasses/contacts?: Yes Does the patient have difficulty concentrating, remembering, or making decisions?: No Patient able to express need for assistance with ADLs?: Yes Does the patient have difficulty dressing or bathing?: No Independently performs ADLs?: Yes (appropriate for developmental age) Does the patient have difficulty walking or climbing stairs?: No Weakness of Legs: None Weakness of Arms/Hands: None  Permission Sought/Granted Permission sought to share information with : Case Manager Permission granted to share information with : Yes, Verbal Permission Granted  Share Information with NAME: Swayze Landfair           Emotional Assessment Appearance:: Appears stated age Attitude/Demeanor/Rapport: Gracious, Engaged Affect (typically observed): Accepting, Adaptable, Stable Orientation: : Oriented to Self, Oriented to Place, Oriented to Situation Alcohol / Substance Use: Not Applicable Psych Involvement: No (comment)  Admission diagnosis:  Stroke [facility]) [I63.9] Acute ischemic stroke [facility]) [I63.9] Patient Active Problem List   Diagnosis Date Noted  . Stroke ([place]) 06/30/2019  . Prediabetes 12/23/2016  . Elevated LFTs 12/23/2016  . Pure hypercholesterolemia 12/21/2016  . Alcoholism ([place]) 12/21/2016  . Eczema 09/18/2013  . Essential hypertension 09/18/2013  . Gastroesophageal reflux disease without esophagitis 09/18/2013  . Tobacco abuse 09/18/2013   PCP:  Inda Coke, PA Pharmacy:   [id number] - [place], [place] -  [address] [place] [address] Phone: [phone] Fax: [phone]     Social Determinants of Health (SDOH) Interventions    Readmission Risk Interventions No flowsheet data  found.

## 2019-07-03 NOTE — Progress Notes (Signed)
Physical Therapy Treatment Patient Details Name: Russell Russell MRN: [id number] DOB: 1950/06/16 Today's Date: 07/03/2019    History of Present Illness 69 year old male admitted 06/30/19 code stroke after being found down at home (from approximately 10:30AM-5:30PM). Patient found to have acute stroke R frontal lobe and L cerebellum, likely embolic in nature. Neurology consulted. CTA head and neck as well as CUS concerning for right ICA bulb questionable filling defect. Patient outside tPA window and no invasive intervention. He continues to have LUE and LLE weakness. BP high on arrival (186/125). Permissive hypertension (<220/120) but gradually normalize in 2-3 days per neurology note. Patient on CIWA due to alcohol use. PMH: HTN, alcohol abuse (ongoing use), HLD, GERD, PUD, tobacco use     PT Comments    Patient requires two person assist and use of Stedy for transfer to chair. He demonstrates L and forward flexed lean in sitting and standing, improved with RUE reaching laterally to foot rail of bed when sitting EOB but requires assist for weight shifting. Continued recommendation for skilled PT services and discharge to inpatient rehab facility for multidisciplinary, intense rehab.   Follow Up Recommendations  CIR     Equipment Recommendations  Other (comment);Hospital bed;Wheelchair (measurements PT);Wheelchair cushion (measurements PT)(TBD at next level of care)    Recommendations for Other Services Rehab consult     Precautions / Restrictions Precautions Precautions: Fall;Other (comment) Precaution Comments: permissive HTN (<220/120), L sided weakness, aspiration precautions (dysphagia 2 with thins), O2 sat >94% Restrictions Weight Bearing Restrictions: No    Mobility  Bed Mobility Overal bed mobility: Needs Assistance Bed Mobility: Supine to Sit     Supine to sit: Mod assist;Max assist;+2 for physical assistance;+2 for safety/equipment;HOB elevated     General bed mobility  comments: Encouraged use of bedrail with RUE  Transfers Overall transfer level: Needs assistance   Transfers: Sit to/from Stand Sit to Stand: Max assist;+2 physical assistance;+2 safety/equipment;From elevated surface  General transfer comment: use of Stedy for transfer EOB>chair with bed height increased. Patient leans to L in sitting and standing and requires cues and assist for midline positioning with use of Stedy.  Ambulation/Gait General Gait Details: not safe to attempt at this time  Modified Rankin (Stroke Patients Only) Modified Rankin (Stroke Patients Only) Pre-Morbid Rankin Score: No symptoms Modified Rankin: Severe disability     Balance Overall balance assessment: Needs assistance Sitting-balance support: Feet supported;Single extremity supported Sitting balance-Leahy Scale: Poor(to zero) Sitting balance - Comments: Verbal, tactile, and visual cues for midline positioning. Patient sits with L forward lean. With use of RUE on footrail of bed to encourage weight shift to right and midline positioning to maintain balance, without RUE to assist on footrail, patient requires maxA Postural control: Left lateral lean Standing balance support: Bilateral upper extremity supported(LEs blocked) Standing balance-Leahy Scale: Zero     Cognition Arousal/Alertness: Awake/alert Behavior During Therapy: [facility] for tasks assessed/performed       General Comments: Follows directions with cues.         General Comments General comments (skin integrity, edema, etc.): BP after transfer to chair 141/116 (permissive HTN), HR 91 bpm, O2 sat 91% on room air, RR 19. Assisted with positioning to prevent contractures, prevent skin breakdown, and for encouragement of midline. Education on turning head to left and for patient to attemp to move LUE and LLE while in chair. Wife present.       Pertinent Vitals/Pain Pain Assessment: No/denies pain  PT Goals (current goals can now be  found in the care plan section) Acute Rehab PT Goals Patient Stated Goal: to go home with wife after rehab Progress towards PT goals: Progressing toward goals    Frequency    Min 3X/week      PT Plan Current plan remains appropriate       AM-PAC PT "6 Clicks" Mobility   Outcome Measure  Help needed turning from your back to your side while in a flat bed without using bedrails?: A Lot Help needed moving from lying on your back to sitting on the side of a flat bed without using bedrails?: A Lot Help needed moving to and from a bed to a chair (including a wheelchair)?: A Lot Help needed standing up from a chair using your arms (e.g., wheelchair or bedside chair)?: Total Help needed to walk in hospital room?: Total Help needed climbing 3-5 steps with a railing? : Total 6 Click Score: 9    End of Session Equipment Utilized During Treatment: Gait belt Activity Tolerance: Patient tolerated treatment well Patient left: in chair;with call bell/phone within reach;with chair alarm set Nurse Communication: Mobility status;Other (comment)(Use of Stedy, left lean) PT Visit Diagnosis: Hemiplegia and hemiparesis;Other abnormalities of gait and mobility (R26.89) Hemiplegia - Right/Left: Left Hemiplegia - caused by: Cerebral infarction     Time: 1100-1127 PT Time Calculation (min) (ACUTE ONLY): 27 min  Charges:  $Therapeutic Activity: 8-22 mins $Neuromuscular Re-education: 8-22 mins                     Birdie Hopes, PT, DPT Acute Rehab [phone] office     Birdie Hopes 07/03/2019, 1:50 PM

## 2019-07-03 NOTE — Progress Notes (Signed)
SLP Cancellation Note  Patient Details Name: Russell Russell MRN: [id number] DOB: September 12, 1950   Cancelled treatment:       Reason Eval/Treat Not Completed: Other (comment) could not address speech language eval today, pt needed nursing care after breakfast and will have procedure this pm. Pt has moderate dysarthria and some cognitive impairment. Recommend CIR for comprehensive cognitive assessment. Will defer in the acute setting.    Amel Gianino, Katherene Ponto 07/03/2019, 11:33 AM

## 2019-07-03 NOTE — Consult Note (Addendum)
ELECTROPHYSIOLOGY CONSULT NOTE  Patient ID: Russell Russell MRN: [id number], DOB/AGE: 1951/02/24   Admit date: 06/30/2019 Date of Consult: 07/03/2019  Primary Physician: Inda Coke, [place] Primary Cardiologist: new to [facility] Reason for Consultation: Cryptogenic stroke; recommendations regarding Implantable Loop Recorder  History of Present Illness EP has been asked to evaluate Russell Russell for placement of an implantable loop recorder to monitor for atrial fibrillation by Dr Erlinda Hong.  The patient was admitted on 06/30/2019 with left sided weakness.  Imaging demonstrated multiple scattered anterior and posterior R brain infarcts felt to be embolic 2/2 unknown source.  He has undergone workup for stroke including echocardiogram and carotid imaging.  Carotid imaging demonstrated possible small carotid web vs less likely thrombus.  The patient has been monitored on telemetry which has demonstrated sinus rhythm with no arrhythmias.   Echocardiogram this admission demonstrated EF 55-60%, LA 33.  Lab work is reviewed.  Prior to admission, the patient denies chest pain, shortness of breath, dizziness, palpitations, or syncope.  They are recovering from their stroke with plans to go to CIR at discharge.    Past Medical History:  Diagnosis Date  . Alcohol abuse    drinks 1 gallon of Gin every weekend, nothing during the week x >15 yrs  . Elevated transaminase level    +elev bili: abd u/s 06/2014 showed stable small hepatic hemangiomas, o/w normal.  . Essential hypertension   . GERD (gastroesophageal reflux disease)   . Hyperlipidemia 03/2014   Atorv started-chol improved  . Impaired fasting glucose 03/2014  . Nephrolithiasis   . PUD (peptic ulcer disease)   . Tobacco dependence    chantix: "psych effects"     Surgical History:  Past Surgical History:  Procedure Laterality Date  . COLONOSCOPY  2005   Recall 10 yrs ([place])  . removal of kidney stones  2006   cystoscopic--removed  from ureter.  No prob since.     Medications Prior to Admission  Medication Sig Dispense Refill Last Dose  . atorvastatin (LIPITOR) 40 MG tablet Take 1 tablet (40 mg total) by mouth daily. 90 tablet 1 Past Month at Unknown time  . amLODipine (NORVASC) 10 MG tablet Take 1 tablet (10 mg total) by mouth daily. (Patient not taking: Reported on 06/30/2019) 30 tablet 1 Not Taking at Unknown time  . halobetasol (ULTRAVATE) 0.05 % cream Apply topically 2 (two) times daily. (Patient not taking: Reported on 06/30/2019) 50 g 3 Not Taking at Unknown time    Inpatient Medications:  . amLODipine  5 mg Oral Daily  . aspirin  300 mg Rectal Daily   Or  . aspirin  325 mg Oral Daily  . atorvastatin  40 mg Oral Daily  . chlorhexidine  15 mL Mouth Rinse BID  . clopidogrel  75 mg Oral Daily  . enoxaparin (LOVENOX) injection  40 mg Subcutaneous Q24H  . feeding supplement (ENSURE ENLIVE)  237 mL Oral BID BM  . folic acid  1 mg Intravenous Daily  . LORazepam  0-4 mg Intravenous Q12H  . mouth rinse  15 mL Mouth Rinse q12n4p  . multivitamin with minerals  1 tablet Oral Daily  . pantoprazole  40 mg Oral Daily  . thiamine  100 mg Oral Daily   Or  . thiamine  100 mg Intravenous Daily    Allergies:  Allergies  Allergen Reactions  . Penicillins Rash    Social History   Socioeconomic History  . Marital status: Married    Spouse name:  Not on file  . Number of children: Not on file  . Years of education: Not on file  . Highest education level: Not on file  Occupational History  . Not on file  Tobacco Use  . Smoking status: Current Every Day Smoker    Packs/day: 0.50    Years: 20.00    Pack years: 10.00    Types: Cigarettes  . Smokeless tobacco: Never Used  Substance and Sexual Activity  . Alcohol use: Yes    Alcohol/week: 8.0 standard drinks    Types: 8 Cans of beer per week    Comment: Weekends only  . Drug use: No  . Sexual activity: Not on file  Other Topics Concern  . Not on file  Social  History Narrative   Married, no children.   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   Tob: 10 pack-yr hx (current as of 03/2014).   Alcohol: 8 beers and pint of whisky on weekends only.   Denies hx of prob with drugs or alcohol.   Social Determinants of Health   Financial Resource Strain:   . Difficulty of Paying Living Expenses:   Food Insecurity:   . Worried About Charity fundraiser in the Last Year:   . Arboriculturist in the Last Year:   Transportation Needs:   . Film/video editor (Medical):   Marland Kitchen Lack of Transportation (Non-Medical):   Physical Activity:   . Days of Exercise per Week:   . Minutes of Exercise per Session:   Stress:   . Feeling of Stress :   Social Connections:   . Frequency of Communication with Friends and Family:   . Frequency of Social Gatherings with Friends and Family:   . Attends Religious Services:   . Active Member of Clubs or Organizations:   . Attends Archivist Meetings:   Marland Kitchen Marital Status:   Intimate Partner Violence:   . Fear of Current or Ex-Partner:   . Emotionally Abused:   Marland Kitchen Physically Abused:   . Sexually Abused:      Family History  Problem Relation Age of Onset  . Colon cancer Mother   . Cancer Father   . Breast cancer Sister   . Breast cancer Sister       Review of Systems: All other systems reviewed and are otherwise negative except as noted above.  Physical Exam: Vitals:   07/02/19 1755 07/03/19 0007 07/03/19 0357 07/03/19 0746  BP:  (!) 160/129 (!) 152/108 (!) 160/122  Pulse: 79 84 83 80  Resp: 19 16 18 19   Temp:  98.9 F (37.2 C) 98.4 F (36.9 C) 98.1 F (36.7 C)  TempSrc:  Oral Oral Axillary  SpO2: 96% 98% 93% 90%  Weight:   63.2 kg   Height:        GEN- The patient is elderly appearing, alert and oriented x 3 today.   Head- normocephalic, atraumatic Eyes-  Sclera clear, conjunctiva pink Ears- hearing intact Oropharynx- clear Neck-  supple Lungs- Clear to ausculation bilaterally, normal work of breathing Heart- Regular rate and rhythm  GI- soft, NT, ND, + BS Extremities- no clubbing, cyanosis, or edema, +LUE contracture MS- no significant deformity or atrophy Skin- no rash or lesion Psych- euthymic mood, full affect   Labs:   Lab Results  Component Value Date   WBC 4.1 07/02/2019   HGB 18.1 (H) 07/02/2019   HCT 52.1 (H) 07/02/2019  MCV 92.7 07/02/2019   PLT 190 07/02/2019    Recent Labs  Lab 06/30/19 1919 06/30/19 1926 07/02/19 0636  NA 136   < > 136  K 4.0   < > 3.9  CL 101   < > 102  CO2 23   < > 24  BUN 7*   < > <5*  CREATININE 1.16   < > 0.94  CALCIUM 9.4   < > 8.8*  PROT 7.3  --   --   BILITOT 1.4*  --   --   ALKPHOS 100  --   --   ALT 29  --   --   AST 53*  --   --   GLUCOSE 134*   < > 101*   < > = values in this interval not displayed.     Radiology/Studies: CT Code Stroke CTA Head W/WO contrast  Addendum Date: 07/01/2019   ADDENDUM REPORT: 07/01/2019 09:49 ADDENDUM: Study discussed by telephone with Dr. Erlinda Hong on 07/01/2019 at 0930 hours. There is subtle heterogeneity at the lateral right ICA bulb (series 4, image 108) suggesting a small, indistinct filling defect in the vessel. Perhaps this is a small carotid web, or less likely adherent thrombus, with some flow related motion. We discussed that I think Carotid Doppler Ultrasound focused on the Right ICA bulb would best evaluate these possibilities. Electronically Signed   By: Genevie Ann M.D.   On: 07/01/2019 09:49   Result Date: 07/01/2019 CLINICAL DATA:  Left-sided weakness EXAM: CT ANGIOGRAPHY HEAD AND NECK CT PERFUSION BRAIN TECHNIQUE: Multidetector CT imaging of the head and neck was performed using the standard protocol during bolus administration of intravenous contrast. Multiplanar CT image reconstructions and MIPs were obtained to evaluate the vascular anatomy. Carotid stenosis measurements (when applicable) are obtained utilizing NASCET  criteria, using the distal internal carotid diameter as the denominator. Multiphase CT imaging of the brain was performed following IV bolus contrast injection. Subsequent parametric perfusion maps were calculated using RAPID software. CONTRAST:  151mL OMNIPAQUE IOHEXOL 350 MG/ML SOLN COMPARISON:  None. FINDINGS: CTA NECK FINDINGS SKELETON: There is no bony spinal canal stenosis. No lytic or blastic lesion. OTHER NECK: Normal pharynx, larynx and major salivary glands. No cervical lymphadenopathy. Unremarkable thyroid gland. UPPER CHEST: No pneumothorax or pleural effusion. No nodules or masses. AORTIC ARCH: There is mild calcific atherosclerosis of the aortic arch. 3.6 cm diameter of descending thoracic aorta arch. Conventional 3 vessel aortic branching pattern. The visualized proximal subclavian arteries are widely patent. RIGHT CAROTID SYSTEM: Normal without aneurysm, dissection or stenosis. LEFT CAROTID SYSTEM: Normal without aneurysm, dissection or stenosis. VERTEBRAL ARTERIES: Right dominant configuration. Both origins are clearly patent. There is no dissection, occlusion or flow-limiting stenosis to the skull base (V1-V3 segments). CTA HEAD FINDINGS POSTERIOR CIRCULATION: --Vertebral arteries: Normal V4 segments. --Posterior inferior cerebellar arteries (PICA): Patent origins from the vertebral arteries. --Anterior inferior cerebellar arteries (AICA): Patent origins from the basilar artery. --Basilar artery: Normal. --Superior cerebellar arteries: Normal. --Posterior cerebral arteries: Normal. Both originate from the basilar artery. Posterior communicating arteries (p-comm) are diminutive or absent. ANTERIOR CIRCULATION: --Intracranial internal carotid arteries: Normal. --Anterior cerebral arteries (ACA): Normal. Both A1 segments are present. Patent anterior communicating artery (a-comm). --Middle cerebral arteries (MCA): Normal. VENOUS SINUSES: As permitted by contrast timing, patent. ANATOMIC VARIANTS: None  Review of the MIP images confirms the above findings. CT Brain Perfusion Findings: ASPECTS: 10 CBF (<30%) Volume: 16mL Perfusion (Tmax>6.0s) volume: 107mL Mismatch Volume: 4mL Infarction Location:Areas of elevated Tmax  are located in the right frontal periventricular white matter and left cerebellum. These regions correspond to areas of hypoattenuation on the noncontrast head CT, including an old left cerebellar infarct. IMPRESSION: 1. No emergent large vessel occlusion or high-grade stenosis of the intracranial arteries. 2. Area of ischemia identified in the right frontal periventricular white matter and left cerebellum, corresponding to the areas of hypoattenuation on the noncontrast head CT and possibly artifact/pseudonormalization. No core infarct by CT perfusion analysis. 3. Aortic Atherosclerosis (ICD10-I70.0). 4. 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 Recommend annual imaging followup by CTA or MRA. This recommendation follows 2010 ACCF/AHA/AATS/ACR/ASA/SCA/SCAI/SIR/STS/SVM Guidelines for the Diagnosis and Management of Patients with Thoracic Aortic Disease. Circulation.2010; [id number]. Aortic aneurysm NOS (ICD10-I71.9) Electronically Signed: By: Ulyses Jarred M.D. On: 06/30/2019 20:03   CT Code Stroke CTA Neck W/WO contrast  Addendum Date: 07/01/2019   ADDENDUM REPORT: 07/01/2019 09:49 ADDENDUM: Study discussed by telephone with Dr. Erlinda Hong on 07/01/2019 at 0930 hours. There is subtle heterogeneity at the lateral right ICA bulb (series 4, image 108) suggesting a small, indistinct filling defect in the vessel. Perhaps this is a small carotid web, or less likely adherent thrombus, with some flow related motion. We discussed that I think Carotid Doppler Ultrasound focused on the Right ICA bulb would best evaluate these possibilities. Electronically Signed   By: Genevie Ann M.D.   On: 07/01/2019 09:49   Result Date: 07/01/2019 CLINICAL DATA:  Left-sided weakness EXAM: CT ANGIOGRAPHY HEAD AND NECK CT PERFUSION BRAIN  TECHNIQUE: Multidetector CT imaging of the head and neck was performed using the standard protocol during bolus administration of intravenous contrast. Multiplanar CT image reconstructions and MIPs were obtained to evaluate the vascular anatomy. Carotid stenosis measurements (when applicable) are obtained utilizing NASCET criteria, using the distal internal carotid diameter as the denominator. Multiphase CT imaging of the brain was performed following IV bolus contrast injection. Subsequent parametric perfusion maps were calculated using RAPID software. CONTRAST:  125mL OMNIPAQUE IOHEXOL 350 MG/ML SOLN COMPARISON:  None. FINDINGS: CTA NECK FINDINGS SKELETON: There is no bony spinal canal stenosis. No lytic or blastic lesion. OTHER NECK: Normal pharynx, larynx and major salivary glands. No cervical lymphadenopathy. Unremarkable thyroid gland. UPPER CHEST: No pneumothorax or pleural effusion. No nodules or masses. AORTIC ARCH: There is mild calcific atherosclerosis of the aortic arch. 3.6 cm diameter of descending thoracic aorta arch. Conventional 3 vessel aortic branching pattern. The visualized proximal subclavian arteries are widely patent. RIGHT CAROTID SYSTEM: Normal without aneurysm, dissection or stenosis. LEFT CAROTID SYSTEM: Normal without aneurysm, dissection or stenosis. VERTEBRAL ARTERIES: Right dominant configuration. Both origins are clearly patent. There is no dissection, occlusion or flow-limiting stenosis to the skull base (V1-V3 segments). CTA HEAD FINDINGS POSTERIOR CIRCULATION: --Vertebral arteries: Normal V4 segments. --Posterior inferior cerebellar arteries (PICA): Patent origins from the vertebral arteries. --Anterior inferior cerebellar arteries (AICA): Patent origins from the basilar artery. --Basilar artery: Normal. --Superior cerebellar arteries: Normal. --Posterior cerebral arteries: Normal. Both originate from the basilar artery. Posterior communicating arteries (p-comm) are diminutive or  absent. ANTERIOR CIRCULATION: --Intracranial internal carotid arteries: Normal. --Anterior cerebral arteries (ACA): Normal. Both A1 segments are present. Patent anterior communicating artery (a-comm). --Middle cerebral arteries (MCA): Normal. VENOUS SINUSES: As permitted by contrast timing, patent. ANATOMIC VARIANTS: None Review of the MIP images confirms the above findings. CT Brain Perfusion Findings: ASPECTS: 10 CBF (<30%) Volume: 2mL Perfusion (Tmax>6.0s) volume: 43mL Mismatch Volume: 36mL Infarction Location:Areas of elevated Tmax are located in the right frontal periventricular white matter and left cerebellum.  These regions correspond to areas of hypoattenuation on the noncontrast head CT, including an old left cerebellar infarct. IMPRESSION: 1. No emergent large vessel occlusion or high-grade stenosis of the intracranial arteries. 2. Area of ischemia identified in the right frontal periventricular white matter and left cerebellum, corresponding to the areas of hypoattenuation on the noncontrast head CT and possibly artifact/pseudonormalization. No core infarct by CT perfusion analysis. 3. Aortic Atherosclerosis (ICD10-I70.0). 4. 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 Recommend annual imaging followup by CTA or MRA. This recommendation follows 2010 ACCF/AHA/AATS/ACR/ASA/SCA/SCAI/SIR/STS/SVM Guidelines for the Diagnosis and Management of Patients with Thoracic Aortic Disease. Circulation.2010; [id number]. Aortic aneurysm NOS (ICD10-I71.9) Electronically Signed: By: Ulyses Jarred M.D. On: 06/30/2019 20:03   MR BRAIN WO CONTRAST  Result Date: 07/01/2019 CLINICAL DATA:  Follow-up examination for acute stroke. EXAM: MRI HEAD WITHOUT CONTRAST TECHNIQUE: Multiplanar, multiecho pulse sequences of the brain and surrounding structures were obtained without intravenous contrast. COMPARISON:  Prior CTs from 06/30/2019. FINDINGS: Brain: Examination degraded by motion artifact. Generalized age-related cerebral atrophy.  Extensive patchy and confluent T2/FLAIR hyperintensity within the periventricular deep white matter both cerebral hemispheres as well as the pons, most consistent with chronic small vessel ischemic disease, fairly advanced in nature. Multiple superimposed remote lacunar infarcts seen about the bilateral basal ganglia, thalami, and pons. Chronic left cerebellar infarct noted as well. Approximate 2.5 cm linear focus of restricted diffusion seen extending from the posterior right lentiform nucleus into the posterior right caudate and corona radiata, consistent with an acute ischemic perforator type infarct (series 5, image 73). Few additional scattered ischemic infarcts seen involving the subcortical white matter of the overlying right frontal lobe (w series 5, image 83, 81). Additional subcentimeter acute ischemic infarct noted within the subcortical right occipital lobe (series 5, image 63). Approximate 1 cm acute ischemic infarcts seen involving the inferior right cerebellum (series 5, image 53). No definite associated hemorrhage about these infarcts. No associated mass effect. No mass lesion or midline shift. Mild ventricular prominence related to global parenchymal volume loss without hydrocephalus. No extra-axial fluid collection. Pituitary gland suprasellar region within normal limits. Midline structures intact. Vascular: Major intracranial vascular flow voids are maintained. Skull and upper cervical spine: Craniocervical junction within normal limits. Bone marrow signal intensity normal. No scalp soft tissue abnormality. Sinuses/Orbits: Globes and orbital soft tissues within normal limits. Mild scattered mucosal thickening noted throughout the paranasal sinuses. No mastoid effusion. Inner ear structures normal. Other: None. IMPRESSION: 1. Multiple scattered acute ischemic infarcts involving the posterior right basal ganglia, right cerebral hemisphere, and right cerebellum as above. No associated hemorrhage or  mass effect. 2. Underlying atrophy with advanced chronic microvascular ischemic disease, with multiple remote lacunar infarcts about the bilateral basal ganglia, thalami, and pons, with additional chronic left cerebellar infarct. Electronically Signed   By: Jeannine Boga M.D.   On: 07/01/2019 01:29   CT Code Stroke Cerebral Perfusion with contrast  Addendum Date: 07/01/2019   ADDENDUM REPORT: 07/01/2019 09:49 ADDENDUM: Study discussed by telephone with Dr. Erlinda Hong on 07/01/2019 at 0930 hours. There is subtle heterogeneity at the lateral right ICA bulb (series 4, image 108) suggesting a small, indistinct filling defect in the vessel. Perhaps this is a small carotid web, or less likely adherent thrombus, with some flow related motion. We discussed that I think Carotid Doppler Ultrasound focused on the Right ICA bulb would best evaluate these possibilities. Electronically Signed   By: Genevie Ann M.D.   On: 07/01/2019 09:49   Result Date: 07/01/2019 CLINICAL DATA:  Left-sided weakness EXAM: CT ANGIOGRAPHY HEAD AND NECK CT PERFUSION BRAIN TECHNIQUE: Multidetector CT imaging of the head and neck was performed using the standard protocol during bolus administration of intravenous contrast. Multiplanar CT image reconstructions and MIPs were obtained to evaluate the vascular anatomy. Carotid stenosis measurements (when applicable) are obtained utilizing NASCET criteria, using the distal internal carotid diameter as the denominator. Multiphase CT imaging of the brain was performed following IV bolus contrast injection. Subsequent parametric perfusion maps were calculated using RAPID software. CONTRAST:  120mL OMNIPAQUE IOHEXOL 350 MG/ML SOLN COMPARISON:  None. FINDINGS: CTA NECK FINDINGS SKELETON: There is no bony spinal canal stenosis. No lytic or blastic lesion. OTHER NECK: Normal pharynx, larynx and major salivary glands. No cervical lymphadenopathy. Unremarkable thyroid gland. UPPER CHEST: No pneumothorax or pleural  effusion. No nodules or masses. AORTIC ARCH: There is mild calcific atherosclerosis of the aortic arch. 3.6 cm diameter of descending thoracic aorta arch. Conventional 3 vessel aortic branching pattern. The visualized proximal subclavian arteries are widely patent. RIGHT CAROTID SYSTEM: Normal without aneurysm, dissection or stenosis. LEFT CAROTID SYSTEM: Normal without aneurysm, dissection or stenosis. VERTEBRAL ARTERIES: Right dominant configuration. Both origins are clearly patent. There is no dissection, occlusion or flow-limiting stenosis to the skull base (V1-V3 segments). CTA HEAD FINDINGS POSTERIOR CIRCULATION: --Vertebral arteries: Normal V4 segments. --Posterior inferior cerebellar arteries (PICA): Patent origins from the vertebral arteries. --Anterior inferior cerebellar arteries (AICA): Patent origins from the basilar artery. --Basilar artery: Normal. --Superior cerebellar arteries: Normal. --Posterior cerebral arteries: Normal. Both originate from the basilar artery. Posterior communicating arteries (p-comm) are diminutive or absent. ANTERIOR CIRCULATION: --Intracranial internal carotid arteries: Normal. --Anterior cerebral arteries (ACA): Normal. Both A1 segments are present. Patent anterior communicating artery (a-comm). --Middle cerebral arteries (MCA): Normal. VENOUS SINUSES: As permitted by contrast timing, patent. ANATOMIC VARIANTS: None Review of the MIP images confirms the above findings. CT Brain Perfusion Findings: ASPECTS: 10 CBF (<30%) Volume: 46mL Perfusion (Tmax>6.0s) volume: 40mL Mismatch Volume: 59mL Infarction Location:Areas of elevated Tmax are located in the right frontal periventricular white matter and left cerebellum. These regions correspond to areas of hypoattenuation on the noncontrast head CT, including an old left cerebellar infarct. IMPRESSION: 1. No emergent large vessel occlusion or high-grade stenosis of the intracranial arteries. 2. Area of ischemia identified in the right  frontal periventricular white matter and left cerebellum, corresponding to the areas of hypoattenuation on the noncontrast head CT and possibly artifact/pseudonormalization. No core infarct by CT perfusion analysis. 3. Aortic Atherosclerosis (ICD10-I70.0). 4. 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 Recommend annual imaging followup by CTA or MRA. This recommendation follows 2010 ACCF/AHA/AATS/ACR/ASA/SCA/SCAI/SIR/STS/SVM Guidelines for the Diagnosis and Management of Patients with Thoracic Aortic Disease. Circulation.2010; [id number]. Aortic aneurysm NOS (ICD10-I71.9) Electronically Signed: By: Ulyses Jarred M.D. On: 06/30/2019 20:03   ECHOCARDIOGRAM COMPLETE BUBBLE STUDY  Result Date: 07/01/2019    ECHOCARDIOGRAM REPORT   Patient Name:   Russell Russell Date of Exam: 07/01/2019 Medical Rec #:  [id number]      Height:       70.0 in Accession #:    [id number]     Weight:       151.0 lb Date of Birth:  Oct 05, 1950     BSA:          1.852 m Patient Age:    27 years       BP:           161/130 mmHg Patient Gender: M  HR:           83 bpm. Exam Location:  Inpatient Procedure: 2D Echo, Color Doppler, Cardiac Doppler and Saline Contrast Bubble            Study Indications:    Stroke i163.9  History:        Patient has no prior history of Echocardiogram examinations.                 Risk Factors:Hypertension and Dyslipidemia.  Sonographer:    Raquel Sarna Senior RDCS Referring Phys: [place]  Sonographer Comments: Technically difficult study due to poor echo windows, suboptimal parasternal window and suboptimal apical window. Technically difficult due to lung interference and respiratory variation. Patient coughing and moving thoughout exam. No parasternal, apical, or suprasternal window. IMPRESSIONS  1. Very poor study. All views basically obtained from the subcostal views.  2. Left ventricular ejection fraction, by estimation, is 55 to 60%. The left ventricle has normal function. The left ventricle has no  regional wall motion abnormalities. Left ventricular diastolic function could not be evaluated.  3. Right ventricular systolic function is normal. The right ventricular size is normal.  4. The mitral valve is grossly normal. No evidence of mitral valve regurgitation. No evidence of mitral stenosis.  5. The aortic valve is grossly normal. Aortic valve regurgitation is not visualized. No aortic stenosis is present.  6. The inferior vena cava is normal in size with greater than 50% respiratory variability, suggesting right atrial pressure of 3 mmHg.  7. Agitated saline contrast bubble study was negative, with no evidence of any interatrial shunt. Conclusion(s)/Recommendation(s): No intracardiac source of embolism detected on this transthoracic study. A transesophageal echocardiogram is recommended to exclude cardiac source of embolism if clinically indicated. FINDINGS  Left Ventricle: Left ventricular ejection fraction, by estimation, is 55 to 60%. The left ventricle has normal function. The left ventricle has no regional wall motion abnormalities. The left ventricular internal cavity size was normal in size. There is  no left ventricular hypertrophy. Left ventricular diastolic function could not be evaluated due to nondiagnostic images. Left ventricular diastolic function could not be evaluated. Right Ventricle: The right ventricular size is normal. No increase in right ventricular wall thickness. Right ventricular systolic function is normal. Left Atrium: Left atrial size was normal in size. Right Atrium: Right atrial size was normal in size. Pericardium: Trivial pericardial effusion is present. Mitral Valve: The mitral valve is grossly normal. No evidence of mitral valve regurgitation. No evidence of mitral valve stenosis. Tricuspid Valve: The tricuspid valve is grossly normal. Tricuspid valve regurgitation is trivial. No evidence of tricuspid stenosis. Aortic Valve: The aortic valve is grossly normal. Aortic valve  regurgitation is not visualized. No aortic stenosis is present. Pulmonic Valve: The pulmonic valve was grossly normal. Pulmonic valve regurgitation is not visualized. No evidence of pulmonic stenosis. Aorta: The aortic root is normal in size and structure. Venous: The inferior vena cava is normal in size with greater than 50% respiratory variability, suggesting right atrial pressure of 3 mmHg. IAS/Shunts: No atrial level shunt detected by color flow Doppler. Agitated saline contrast was given intravenously to evaluate for intracardiac shunting. Agitated saline contrast bubble study was negative, with no evidence of any interatrial shunt.  LEFT VENTRICLE PLAX 2D LVIDd:         3.80 cm  Diastology LVIDs:         2.50 cm  LV e' lateral: 6.20 cm/s LV PW:  1.10 cm  LV e' medial:  4.57 cm/s LV IVS:        1.00 cm LVOT diam:     2.10 cm LV SV:         48 LV SV Index:   26 LVOT Area:     3.46 cm  RIGHT VENTRICLE RV S prime:     9.46 cm/s TAPSE (M-mode): 1.8 cm LEFT ATRIUM           Index       RIGHT ATRIUM           Index LA diam:      3.30 cm 1.78 cm/m  RA Area:     19.00 cm LA Vol (A4C): 50.4 ml 27.18 ml/m RA Volume:   57.90 ml  31.26 ml/m  AORTIC VALVE LVOT Vmax:   64.00 cm/s LVOT Vmean:  50.200 cm/s LVOT VTI:    0.140 m  AORTA Ao Root diam: 3.60 cm  SHUNTS Systemic VTI:  0.14 m Systemic Diam: 2.10 cm Eleonore Chiquito MD Electronically signed by Eleonore Chiquito MD Signature Date/Time: 07/01/2019/3:25:46 PM    Final    CT HEAD CODE STROKE WO CONTRAST  Result Date: 06/30/2019 CLINICAL DATA:  Code stroke.  Left-sided weakness EXAM: CT HEAD WITHOUT CONTRAST TECHNIQUE: Contiguous axial images were obtained from the base of the skull through the vertex without intravenous contrast. COMPARISON:  None. FINDINGS: Brain: There is no mass, hemorrhage or extra-axial collection. There is generalized atrophy without lobar predilection. There is hypoattenuation of the periventricular white matter, most commonly indicating  chronic ischemic microangiopathy. Old left basal ganglia small vessel infarct. Vascular: No abnormal hyperdensity of the major intracranial arteries or dural venous sinuses. No intracranial atherosclerosis. Skull: The visualized skull base, calvarium and extracranial soft tissues are normal. Sinuses/Orbits: No fluid levels or advanced mucosal thickening of the visualized paranasal sinuses. No mastoid or middle ear effusion. The orbits are normal. ASPECTS [facility] Stroke Program Early CT Score) - Ganglionic level infarction (caudate, lentiform nuclei, internal capsule, insula, M1-M3 cortex): 7 - Supraganglionic infarction (M4-M6 cortex): 3 Total score (0-10 with 10 being normal): 10 IMPRESSION: 1. Chronic ischemic microangiopathy and generalized atrophy without acute intracranial abnormality. 2. ASPECTS is 10. * These results were communicated to Dr. Kerney Elbe at 7:30 pm on 06/30/2019 by text page via the [facility] messaging system. Electronically Signed   By: Ulyses Jarred M.D.   On: 06/30/2019 19:30   VAS US CAROTID  Result Date: 07/02/2019 Carotid Arterial Duplex Study Indications:       CVA and follow up possible carotid web versus thrombus seen                    on CT. Comparison Study:  No prior study Performing Technologist: Maudry Mayhew MHA, RDMS, RVT, RDCS  Examination Guidelines: A complete evaluation includes B-mode imaging, spectral Doppler, color Doppler, and power Doppler as needed of all accessible portions of each vessel. Bilateral testing is considered an integral part of a complete examination. Limited examinations for reoccurring indications may be performed as noted.  Right Carotid Findings: +----------+--------+--------+--------+------------------+--------+           PSV cm/sEDV cm/sStenosisPlaque DescriptionComments +----------+--------+--------+--------+------------------+--------+ CCA Prox  51      14                                          +----------+--------+--------+--------+------------------+--------+ CCA  Distal44      16                                         +----------+--------+--------+--------+------------------+--------+ ICA Prox  17      4                                          +----------+--------+--------+--------+------------------+--------+ ICA Distal59      26                                         +----------+--------+--------+--------+------------------+--------+ ECA       44      14                                         +----------+--------+--------+--------+------------------+--------+   Summary: Right Carotid: Velocities in the right ICA are consistent with a 1-39% stenosis.                There is a heterogenous mobile area of the anterior vessel wall                at the carotid bulb/proximal ICA (Images 16-19). Cannot                differentiate between possible carotid web versus thrombus versus                unknown etiology.  *See table(s) above for measurements and observations.  Electronically signed by Antony Contras MD on 07/02/2019 at 9:44:11 AM.   Final    VAS [place] LOWER EXTREMITY VENOUS (DVT)  Result Date: 07/02/2019  Lower Venous DVTStudy Indications: Stroke.  Risk Factors: None identified. Comparison Study: No prior studies. Performing Technologist: Oliver Hum RVT  Examination Guidelines: A complete evaluation includes B-mode imaging, spectral Doppler, color Doppler, and power Doppler as needed of all accessible portions of each vessel. Bilateral testing is considered an integral part of a complete examination. Limited examinations for reoccurring indications may be performed as noted. The reflux portion of the exam is performed with the patient in reverse Trendelenburg.  +---------+---------------+---------+-----------+----------+--------------+ RIGHT    CompressibilityPhasicitySpontaneityPropertiesThrombus Aging  +---------+---------------+---------+-----------+----------+--------------+ CFV      Full           Yes      Yes                                 +---------+---------------+---------+-----------+----------+--------------+ SFJ      Full                                                        +---------+---------------+---------+-----------+----------+--------------+ FV Prox  Full                                                        +---------+---------------+---------+-----------+----------+--------------+  FV Mid   Full                                                        +---------+---------------+---------+-----------+----------+--------------+ FV DistalFull                                                        +---------+---------------+---------+-----------+----------+--------------+ PFV      Full                                                        +---------+---------------+---------+-----------+----------+--------------+ POP      Full           Yes      Yes                                 +---------+---------------+---------+-----------+----------+--------------+ PTV      Full                                                        +---------+---------------+---------+-----------+----------+--------------+ PERO     Full                                                        +---------+---------------+---------+-----------+----------+--------------+   +---------+---------------+---------+-----------+----------+--------------+ LEFT     CompressibilityPhasicitySpontaneityPropertiesThrombus Aging +---------+---------------+---------+-----------+----------+--------------+ CFV      Full           Yes      Yes                                 +---------+---------------+---------+-----------+----------+--------------+ SFJ      Full                                                         +---------+---------------+---------+-----------+----------+--------------+ FV Prox  Full                                                        +---------+---------------+---------+-----------+----------+--------------+ FV Mid   Full                                                        +---------+---------------+---------+-----------+----------+--------------+  FV DistalFull                                                        +---------+---------------+---------+-----------+----------+--------------+ PFV      Full                                                        +---------+---------------+---------+-----------+----------+--------------+ POP      Full           Yes      Yes                                 +---------+---------------+---------+-----------+----------+--------------+ PTV      Full                                                        +---------+---------------+---------+-----------+----------+--------------+ PERO     Full                                                        +---------+---------------+---------+-----------+----------+--------------+     Summary: RIGHT: - There is no evidence of deep vein thrombosis in the lower extremity.  - No cystic structure found in the popliteal fossa.  LEFT: - There is no evidence of deep vein thrombosis in the lower extremity.  - No cystic structure found in the popliteal fossa.  *See table(s) above for measurements and observations. Electronically signed by Monica Martinez MD on 07/02/2019 at 8:09:03 PM.    Final     12-lead ECG ST, LAFB (personally reviewed) All prior EKG's in EPIC reviewed with no documented atrial fibrillation  Telemetry SR (personally reviewed)  Assessment and Plan:  1. Cryptogenic stroke The patient presents with cryptogenic stroke.    I spoke at length with the patient about monitoring for afib with an implantable loop recorder.  Risks, benefits, and alteratives to  implantable loop recorder were discussed with the patient today.  He would like to discuss further with his wife who is not currently at the bedside. Will check back later today.   Please call with questions.   Chanetta Marshall, NP 07/03/2019 8:10 AM  Addendum:  Discussed with wife at length - they would like to proceed with ILR implant. Will plan for later today  Chanetta Marshall, NP 07/03/2019 11:22 AM  I have seen, examined the patient, and reviewed the above assessment and plan.  Changes to above are made where necessary.  On exam, RRR.  The patient presents with cryptogenic stroke.  I agree with Dr Erlinda Hong that ILR is indicated to evaluate for afib as the possible cause for his stroke. Risks and benefits of ILR including but not limited to bleeding and infection were discussed with patient and wife who wish to proceed as above.  Co Sign: Thompson Grayer, MD 07/03/2019 12:39 PM

## 2019-07-03 NOTE — Progress Notes (Signed)
PROGRESS NOTE                                                                                                                                                                                                             Patient Demographics:    Russell Russell, is a 69 y.o. male, DOB - Jun 12, 1950, [id number]  Admit date - 06/30/2019   Admitting Physician Dwyane Dee, MD  Outpatient Primary MD for the patient is Inda Coke, [place]  LOS - 3   Chief Complaint  Patient presents with  . Code Stroke       Brief Narrative    Russell Russell is a 69 y.o. male with history of HTN, HLD, tobacco and alcohol abuse presenting with L sided weakness, MRI significant for acute CVA, he was admitted for further work-up.   Subjective:    Russell Russell today has, No headache, No chest pain, No abdominal pain , does report some improvement in his left-sided weakness today, denies any chest pain or shortness of breath.   Assessment  & Plan :    Active Problems:   Stroke [facility])   Acute ischemic stroke (HCC)   Alcohol abuse   Dyslipidemia   Dysphagia, post-stroke   Polycythemia   Acute CVA: -Neurology input greatly appreciated, evidence of multiple scattered anterior and posterior right brain infarct, likely embolic, secondary to unclear source. - CTA head & neck no ELVO. Aortic atherosclerosis. 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  Subtle R ICA bulb indistinct filling defect possible small carotid web vs less likely thrombus. - CT perfusion R frontal periventricular white matter and L cerebellar artifact/pseudonormalization. No core infarct.  - MRI  Multiple scattered posterior R basal ganglia, R cerebral, R cerebellar infarcts.  Small vessel disease. Atrophy. Multiple old lacunes B basal ganglia, thalami, pons and L cerebellar infarct  -2D echo with a preserved EF 55 to 60%, with no source of embolus -Lower extremity venous Doppler with no DVT -Admit to  neurology, likely will need loop recorder to rule out A. Fib.  No evidence of A. fib on telemetry. -Currently on aspirin and Plavix 75 mg oral daily, need to continue for 31-month, and then aspirin alone -PT/OT> CIR consulted, plan for SNF placement. -SLP consulted: Dysphagia 2  -EP cardiology to place loop recorder to rule out A. Fib.  Hypertension/hypertensive urgency -Allow  for permissive hypertension -Now blood pressure has started to increase, I have stopped his IV fluids and start him on low-dose Norvasc.  Hyperlipidemia -Should be on Lipitor 40 mg oral daily, but he is noncompliant, LDL is 109, goal is less than 70.  Patient will right carotid filling defect -Web versus thrombus, will continue dual antiplatelet therapy for 3 months, then repeat CTA neck and CUS for further evaluation  Alcohol abuse -On folic acid, B1 and multivitamin, on CIWA protocol, no evidence of withdrawals  Polycythemia -Likely secondary to tobacco abuse, follow-up JAK2 mutation.  Tobacco abuse -He was counseled  Polycythemia HCT 19.2-20.1-18.0 - smoker  Code Status : Full  Family Communication  : wife at bedsdie  Disposition Plan  :  Status is: Inpatient   Dispo: The patient is from: Home              Anticipated d/c is to: SNF               Anticipated d/c date is: 2 days              Patient currently is not medically stable to d/c. will need lop recorder.  Consults  :  neurology  Procedures  : None  DVT Prophylaxis  :  Lovenox  Lab Results  Component Value Date   PLT 190 07/02/2019    Antibiotics  :    Anti-infectives (From admission, onward)   None        Objective:   Vitals:   07/03/19 0357 07/03/19 0746 07/03/19 1202 07/03/19 1325  BP: (!) 152/108 (!) 160/122 (!) 144/107 (!) 138/109  Pulse: 83 80 (!) 102   Resp: 18 19 20    Temp: 98.4 F (36.9 C) 98.1 F (36.7 C) 98.5 F (36.9 C)   TempSrc: Oral Axillary Oral   SpO2: 93% 90% 90%   Weight: 63.2 kg     Height:         Wt Readings from Last 3 Encounters:  07/03/19 63.2 kg  04/24/19 65.4 kg  03/24/19 66.9 kg     Intake/Output Summary (Last 24 hours) at 07/03/2019 1429 Last data filed at 07/03/2019 0300 Gross per 24 hour  Intake 1487.5 ml  Output 950 ml  Net 537.5 ml     Physical Exam  Awake Alert, Oriented X 3, with dense left side hemiparesis, but has minimal improvement today. Symmetrical Chest wall movement, Good air movement bilaterally, CTAB RRR,No Gallops,Rubs or new Murmurs, No Parasternal Heave +ve B.Sounds, Abd Soft, No tenderness, No rebound - guarding or rigidity. No Cyanosis, Clubbing or edema, No new Rash or bruise        Data Review:    CBC Recent Labs  Lab 06/30/19 1919 06/30/19 1926 07/01/19 0315 07/02/19 0636  WBC 4.6  --  3.4* 4.1  HGB 19.2* 20.1* 18.0* 18.1*  HCT 54.7* 59.0* 52.3* 52.1*  PLT 190  --  216 190  MCV 91.8  --  92.2 92.7  MCH 32.2  --  31.7 32.2  MCHC 35.1  --  34.4 34.7  RDW 16.0*  --  15.8* 15.9*  LYMPHSABS 0.7  --   --   --   MONOABS 0.5  --   --   --   EOSABS 0.0  --   --   --   BASOSABS 0.0  --   --   --     Chemistries  Recent Labs  Lab 06/30/19 1919 06/30/19 1926 07/01/19 0315 07/02/19 0636 07/03/19  0703  NA 136 137 138 136 135  K 4.0 3.8 3.5 3.9 3.7  CL 101 101 102 102 101  CO2 23  --  19* 24 24  GLUCOSE 134* 132* 133* 101* 92  BUN 7* 8 6* <5* <5*  CREATININE 1.16 0.90 0.98 0.94 1.02  CALCIUM 9.4  --  9.1 8.8* 8.9  MG  --   --  1.7  --   --   AST 53*  --   --   --   --   ALT 29  --   --   --   --   ALKPHOS 100  --   --   --   --   BILITOT 1.4*  --   --   --   --    ------------------------------------------------------------------------------------------------------------------ Recent Labs    07/01/19 0315  CHOL 196  HDL 77  LDLCALC 109*  TRIG 52  CHOLHDL 2.5    Lab Results  Component Value Date   HGBA1C 6.2 (H) 07/01/2019    ------------------------------------------------------------------------------------------------------------------ Recent Labs    07/01/19 0315  TSH 2.209   ------------------------------------------------------------------------------------------------------------------ Recent Labs    07/01/19 0315  VITAMINB12 383  FOLATE 5.6*    Coagulation profile Recent Labs  Lab 06/30/19 1955  INR 1.0    No results for input(s): DDIMER in the last 72 hours.  Cardiac Enzymes No results for input(s): CKMB, TROPONINI, MYOGLOBIN in the last 168 hours.  Invalid input(s): CK ------------------------------------------------------------------------------------------------------------------ No results found for: BNP  Inpatient Medications  Scheduled Meds: . amLODipine  5 mg Oral Daily  . aspirin  300 mg Rectal Daily   Or  . aspirin  325 mg Oral Daily  . atorvastatin  40 mg Oral Daily  . chlorhexidine  15 mL Mouth Rinse BID  . clopidogrel  75 mg Oral Daily  . enoxaparin (LOVENOX) injection  40 mg Subcutaneous Q24H  . feeding supplement (ENSURE ENLIVE)  237 mL Oral BID BM  . folic acid  1 mg Intravenous Daily  . LORazepam  0-4 mg Intravenous Q12H  . mouth rinse  15 mL Mouth Rinse q12n4p  . multivitamin with minerals  1 tablet Oral Daily  . pantoprazole  40 mg Oral Daily  . thiamine  100 mg Oral Daily   Or  . thiamine  100 mg Intravenous Daily   Continuous Infusions:  PRN Meds:.acetaminophen **OR** acetaminophen (TYLENOL) oral liquid 160 mg/5 mL **OR** acetaminophen, labetalol, LORazepam **OR** LORazepam, senna-docusate  Micro Results Recent Results (from the past 240 hour(s))  SARS CORONAVIRUS 2 (TAT 6-24 HRS) Nasopharyngeal Nasopharyngeal Swab     Status: None   Collection Time: 06/30/19  9:58 PM   Specimen: Nasopharyngeal Swab  Result Value Ref Range Status   SARS Coronavirus 2 NEGATIVE NEGATIVE Final    Comment: (NOTE) SARS-CoV-2 target nucleic acids are NOT  DETECTED. The SARS-CoV-2 RNA is generally detectable in upper and lower respiratory specimens during the acute phase of infection. Negative results do not preclude SARS-CoV-2 infection, do not rule out co-infections with other pathogens, and should not be used as the sole basis for treatment or other patient management decisions. Negative results must be combined with clinical observations, patient history, and epidemiological information. The expected result is Negative. Fact Sheet for Patients: SugarRoll.be Fact Sheet for Healthcare Providers: https://www.woods-mathews.com/ This test is not yet approved or cleared by the [place] FDA and  has been authorized for detection and/or diagnosis of SARS-CoV-2 by FDA under an Emergency Use Authorization (EUA). This  EUA will remain  in effect (meaning this test can be used) for the duration of the COVID-19 declaration under Section 56 4(b)(1) of the Act, 21 U.S.C. section 360bbb-3(b)(1), unless the authorization is terminated or revoked sooner. Performed at [facility], [place] [address], [place],  [address]     Radiology Reports CT Code Stroke CTA Head W/WO contrast  Addendum Date: 07/01/2019   ADDENDUM REPORT: 07/01/2019 09:49 ADDENDUM: Study discussed by telephone with Dr. Erlinda Hong on 07/01/2019 at 0930 hours. There is subtle heterogeneity at the lateral right ICA bulb (series 4, image 108) suggesting a small, indistinct filling defect in the vessel. Perhaps this is a small carotid web, or less likely adherent thrombus, with some flow related motion. We discussed that I think Carotid Doppler Ultrasound focused on the Right ICA bulb would best evaluate these possibilities. Electronically Signed   By: Genevie Ann M.D.   On: 07/01/2019 09:49   Result Date: 07/01/2019 CLINICAL DATA:  Left-sided weakness EXAM: CT ANGIOGRAPHY HEAD AND NECK CT PERFUSION BRAIN TECHNIQUE: Multidetector CT imaging of the  head and neck was performed using the standard protocol during bolus administration of intravenous contrast. Multiplanar CT image reconstructions and MIPs were obtained to evaluate the vascular anatomy. Carotid stenosis measurements (when applicable) are obtained utilizing NASCET criteria, using the distal internal carotid diameter as the denominator. Multiphase CT imaging of the brain was performed following IV bolus contrast injection. Subsequent parametric perfusion maps were calculated using RAPID software. CONTRAST:  1109mL OMNIPAQUE IOHEXOL 350 MG/ML SOLN COMPARISON:  None. FINDINGS: CTA NECK FINDINGS SKELETON: There is no bony spinal canal stenosis. No lytic or blastic lesion. OTHER NECK: Normal pharynx, larynx and major salivary glands. No cervical lymphadenopathy. Unremarkable thyroid gland. UPPER CHEST: No pneumothorax or pleural effusion. No nodules or masses. AORTIC ARCH: There is mild calcific atherosclerosis of the aortic arch. 3.6 cm diameter of descending thoracic aorta arch. Conventional 3 vessel aortic branching pattern. The visualized proximal subclavian arteries are widely patent. RIGHT CAROTID SYSTEM: Normal without aneurysm, dissection or stenosis. LEFT CAROTID SYSTEM: Normal without aneurysm, dissection or stenosis. VERTEBRAL ARTERIES: Right dominant configuration. Both origins are clearly patent. There is no dissection, occlusion or flow-limiting stenosis to the skull base (V1-V3 segments). CTA HEAD FINDINGS POSTERIOR CIRCULATION: --Vertebral arteries: Normal V4 segments. --Posterior inferior cerebellar arteries (PICA): Patent origins from the vertebral arteries. --Anterior inferior cerebellar arteries (AICA): Patent origins from the basilar artery. --Basilar artery: Normal. --Superior cerebellar arteries: Normal. --Posterior cerebral arteries: Normal. Both originate from the basilar artery. Posterior communicating arteries (p-comm) are diminutive or absent. ANTERIOR CIRCULATION:  --Intracranial internal carotid arteries: Normal. --Anterior cerebral arteries (ACA): Normal. Both A1 segments are present. Patent anterior communicating artery (a-comm). --Middle cerebral arteries (MCA): Normal. VENOUS SINUSES: As permitted by contrast timing, patent. ANATOMIC VARIANTS: None Review of the MIP images confirms the above findings. CT Brain Perfusion Findings: ASPECTS: 10 CBF (<30%) Volume: 59mL Perfusion (Tmax>6.0s) volume: 19mL Mismatch Volume: 89mL Infarction Location:Areas of elevated Tmax are located in the right frontal periventricular white matter and left cerebellum. These regions correspond to areas of hypoattenuation on the noncontrast head CT, including an old left cerebellar infarct. IMPRESSION: 1. No emergent large vessel occlusion or high-grade stenosis of the intracranial arteries. 2. Area of ischemia identified in the right frontal periventricular white matter and left cerebellum, corresponding to the areas of hypoattenuation on the noncontrast head CT and possibly artifact/pseudonormalization. No core infarct by CT perfusion analysis. 3. Aortic Atherosclerosis (ICD10-I70.0). 4. 3.6 cm aortic arch  aneurysm. Recommend annual imaging followup by CTA or MRA. This recommendation follows 2010 ACCF/AHA/AATS/ACR/ASA/SCA/SCAI/SIR/STS/SVM Guidelines for the Diagnosis and Management of Patients with Thoracic Aortic Disease. Circulation.2010; 121JN:[id number]. Aortic aneurysm NOS (ICD10-I71.9) Electronically Signed: By: Ulyses Jarred M.D. On: 06/30/2019 20:03   CT Code Stroke CTA Neck W/WO contrast  Addendum Date: 07/01/2019   ADDENDUM REPORT: 07/01/2019 09:49 ADDENDUM: Study discussed by telephone with Dr. Erlinda Hong on 07/01/2019 at 0930 hours. There is subtle heterogeneity at the lateral right ICA bulb (series 4, image 108) suggesting a small, indistinct filling defect in the vessel. Perhaps this is a small carotid web, or less likely adherent thrombus, with some flow related motion. We discussed that  I think Carotid Doppler Ultrasound focused on the Right ICA bulb would best evaluate these possibilities. Electronically Signed   By: Genevie Ann M.D.   On: 07/01/2019 09:49   Result Date: 07/01/2019 CLINICAL DATA:  Left-sided weakness EXAM: CT ANGIOGRAPHY HEAD AND NECK CT PERFUSION BRAIN TECHNIQUE: Multidetector CT imaging of the head and neck was performed using the standard protocol during bolus administration of intravenous contrast. Multiplanar CT image reconstructions and MIPs were obtained to evaluate the vascular anatomy. Carotid stenosis measurements (when applicable) are obtained utilizing NASCET criteria, using the distal internal carotid diameter as the denominator. Multiphase CT imaging of the brain was performed following IV bolus contrast injection. Subsequent parametric perfusion maps were calculated using RAPID software. CONTRAST:  129mL OMNIPAQUE IOHEXOL 350 MG/ML SOLN COMPARISON:  None. FINDINGS: CTA NECK FINDINGS SKELETON: There is no bony spinal canal stenosis. No lytic or blastic lesion. OTHER NECK: Normal pharynx, larynx and major salivary glands. No cervical lymphadenopathy. Unremarkable thyroid gland. UPPER CHEST: No pneumothorax or pleural effusion. No nodules or masses. AORTIC ARCH: There is mild calcific atherosclerosis of the aortic arch. 3.6 cm diameter of descending thoracic aorta arch. Conventional 3 vessel aortic branching pattern. The visualized proximal subclavian arteries are widely patent. RIGHT CAROTID SYSTEM: Normal without aneurysm, dissection or stenosis. LEFT CAROTID SYSTEM: Normal without aneurysm, dissection or stenosis. VERTEBRAL ARTERIES: Right dominant configuration. Both origins are clearly patent. There is no dissection, occlusion or flow-limiting stenosis to the skull base (V1-V3 segments). CTA HEAD FINDINGS POSTERIOR CIRCULATION: --Vertebral arteries: Normal V4 segments. --Posterior inferior cerebellar arteries (PICA): Patent origins from the vertebral arteries.  --Anterior inferior cerebellar arteries (AICA): Patent origins from the basilar artery. --Basilar artery: Normal. --Superior cerebellar arteries: Normal. --Posterior cerebral arteries: Normal. Both originate from the basilar artery. Posterior communicating arteries (p-comm) are diminutive or absent. ANTERIOR CIRCULATION: --Intracranial internal carotid arteries: Normal. --Anterior cerebral arteries (ACA): Normal. Both A1 segments are present. Patent anterior communicating artery (a-comm). --Middle cerebral arteries (MCA): Normal. VENOUS SINUSES: As permitted by contrast timing, patent. ANATOMIC VARIANTS: None Review of the MIP images confirms the above findings. CT Brain Perfusion Findings: ASPECTS: 10 CBF (<30%) Volume: 43mL Perfusion (Tmax>6.0s) volume: 52mL Mismatch Volume: 11mL Infarction Location:Areas of elevated Tmax are located in the right frontal periventricular white matter and left cerebellum. These regions correspond to areas of hypoattenuation on the noncontrast head CT, including an old left cerebellar infarct. IMPRESSION: 1. No emergent large vessel occlusion or high-grade stenosis of the intracranial arteries. 2. Area of ischemia identified in the right frontal periventricular white matter and left cerebellum, corresponding to the areas of hypoattenuation on the noncontrast head CT and possibly artifact/pseudonormalization. No core infarct by CT perfusion analysis. 3. Aortic Atherosclerosis (ICD10-I70.0). 4. 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 Recommend annual imaging followup by CTA or MRA. This recommendation follows  2010 ACCF/AHA/AATS/ACR/ASA/SCA/SCAI/SIR/STS/SVM Guidelines for the Diagnosis and Management of Patients with Thoracic Aortic Disease. Circulation.2010; [id number]. Aortic aneurysm NOS (ICD10-I71.9) Electronically Signed: By: Ulyses Jarred M.D. On: 06/30/2019 20:03   MR BRAIN WO CONTRAST  Result Date: 07/01/2019 CLINICAL DATA:  Follow-up examination for acute stroke. EXAM: MRI HEAD  WITHOUT CONTRAST TECHNIQUE: Multiplanar, multiecho pulse sequences of the brain and surrounding structures were obtained without intravenous contrast. COMPARISON:  Prior CTs from 06/30/2019. FINDINGS: Brain: Examination degraded by motion artifact. Generalized age-related cerebral atrophy. Extensive patchy and confluent T2/FLAIR hyperintensity within the periventricular deep white matter both cerebral hemispheres as well as the pons, most consistent with chronic small vessel ischemic disease, fairly advanced in nature. Multiple superimposed remote lacunar infarcts seen about the bilateral basal ganglia, thalami, and pons. Chronic left cerebellar infarct noted as well. Approximate 2.5 cm linear focus of restricted diffusion seen extending from the posterior right lentiform nucleus into the posterior right caudate and corona radiata, consistent with an acute ischemic perforator type infarct (series 5, image 73). Few additional scattered ischemic infarcts seen involving the subcortical white matter of the overlying right frontal lobe (w series 5, image 83, 81). Additional subcentimeter acute ischemic infarct noted within the subcortical right occipital lobe (series 5, image 63). Approximate 1 cm acute ischemic infarcts seen involving the inferior right cerebellum (series 5, image 53). No definite associated hemorrhage about these infarcts. No associated mass effect. No mass lesion or midline shift. Mild ventricular prominence related to global parenchymal volume loss without hydrocephalus. No extra-axial fluid collection. Pituitary gland suprasellar region within normal limits. Midline structures intact. Vascular: Major intracranial vascular flow voids are maintained. Skull and upper cervical spine: Craniocervical junction within normal limits. Bone marrow signal intensity normal. No scalp soft tissue abnormality. Sinuses/Orbits: Globes and orbital soft tissues within normal limits. Mild scattered mucosal thickening  noted throughout the paranasal sinuses. No mastoid effusion. Inner ear structures normal. Other: None. IMPRESSION: 1. Multiple scattered acute ischemic infarcts involving the posterior right basal ganglia, right cerebral hemisphere, and right cerebellum as above. No associated hemorrhage or mass effect. 2. Underlying atrophy with advanced chronic microvascular ischemic disease, with multiple remote lacunar infarcts about the bilateral basal ganglia, thalami, and pons, with additional chronic left cerebellar infarct. Electronically Signed   By: Jeannine Boga M.D.   On: 07/01/2019 01:29   EP PPM/ICD IMPLANT  Result Date: 07/03/2019 SURGEON:  Thompson Grayer, MD   PREPROCEDURE DIAGNOSIS:  Cryptogenic Stroke   POSTPROCEDURE DIAGNOSIS:  Cryptogenic Stroke    PROCEDURES:  1. Implantable loop recorder implantation   INTRODUCTION:  Russell Russell is a 69 y.o. male with a history of unexplained stroke who presents today for implantable loop implantation.  The patient has had a cryptogenic stroke.  Despite an extensive workup by neurology, no reversible causes have been identified.  he has worn telemetry during which he did not have arrhythmias.  There is significant concern for possible atrial fibrillation as the cause for the patients stroke.  The patient therefore presents today for implantable loop implantation.   DESCRIPTION OF PROCEDURE:  Informed written consent was obtained.  The patient required no sedation for the procedure today.  The patients left chest was prepped and draped. Mapping over the patient's chest was performed to identify the appropriate ILR site.  This area was found to be the left parasternal region over the 3rd-4th intercostal space.  The skin overlying this region was infiltrated with lidocaine for local analgesia.  A 0.5-cm incision was made at the  implant site.  A subcutaneous ILR pocket was fashioned using a combination of sharp and blunt dissection.  A Medtronic Reveal Linq model [id number]  implantable loop recorder was then placed into the pocket R waves were very prominent and measured > 0.2 mV. EBL<1 ml.  Steri- Strips and a sterile dressing were then applied.  There were no early apparent complications.   CONCLUSIONS:  1. Successful implantation of a Medtronic Reveal LINQ implantable loop recorder for cryptogenic stroke  2. No early apparent complications. Thompson Grayer MD, [facility] 07/03/2019 12:57 PM   CT Code Stroke Cerebral Perfusion with contrast  Addendum Date: 07/01/2019   ADDENDUM REPORT: 07/01/2019 09:49 ADDENDUM: Study discussed by telephone with Dr. Erlinda Hong on 07/01/2019 at 0930 hours. There is subtle heterogeneity at the lateral right ICA bulb (series 4, image 108) suggesting a small, indistinct filling defect in the vessel. Perhaps this is a small carotid web, or less likely adherent thrombus, with some flow related motion. We discussed that I think Carotid Doppler Ultrasound focused on the Right ICA bulb would best evaluate these possibilities. Electronically Signed   By: Genevie Ann M.D.   On: 07/01/2019 09:49   Result Date: 07/01/2019 CLINICAL DATA:  Left-sided weakness EXAM: CT ANGIOGRAPHY HEAD AND NECK CT PERFUSION BRAIN TECHNIQUE: Multidetector CT imaging of the head and neck was performed using the standard protocol during bolus administration of intravenous contrast. Multiplanar CT image reconstructions and MIPs were obtained to evaluate the vascular anatomy. Carotid stenosis measurements (when applicable) are obtained utilizing NASCET criteria, using the distal internal carotid diameter as the denominator. Multiphase CT imaging of the brain was performed following IV bolus contrast injection. Subsequent parametric perfusion maps were calculated using RAPID software. CONTRAST:  164mL OMNIPAQUE IOHEXOL 350 MG/ML SOLN COMPARISON:  None. FINDINGS: CTA NECK FINDINGS SKELETON: There is no bony spinal canal stenosis. No lytic or blastic lesion. OTHER NECK: Normal pharynx, larynx and major  salivary glands. No cervical lymphadenopathy. Unremarkable thyroid gland. UPPER CHEST: No pneumothorax or pleural effusion. No nodules or masses. AORTIC ARCH: There is mild calcific atherosclerosis of the aortic arch. 3.6 cm diameter of descending thoracic aorta arch. Conventional 3 vessel aortic branching pattern. The visualized proximal subclavian arteries are widely patent. RIGHT CAROTID SYSTEM: Normal without aneurysm, dissection or stenosis. LEFT CAROTID SYSTEM: Normal without aneurysm, dissection or stenosis. VERTEBRAL ARTERIES: Right dominant configuration. Both origins are clearly patent. There is no dissection, occlusion or flow-limiting stenosis to the skull base (V1-V3 segments). CTA HEAD FINDINGS POSTERIOR CIRCULATION: --Vertebral arteries: Normal V4 segments. --Posterior inferior cerebellar arteries (PICA): Patent origins from the vertebral arteries. --Anterior inferior cerebellar arteries (AICA): Patent origins from the basilar artery. --Basilar artery: Normal. --Superior cerebellar arteries: Normal. --Posterior cerebral arteries: Normal. Both originate from the basilar artery. Posterior communicating arteries (p-comm) are diminutive or absent. ANTERIOR CIRCULATION: --Intracranial internal carotid arteries: Normal. --Anterior cerebral arteries (ACA): Normal. Both A1 segments are present. Patent anterior communicating artery (a-comm). --Middle cerebral arteries (MCA): Normal. VENOUS SINUSES: As permitted by contrast timing, patent. ANATOMIC VARIANTS: None Review of the MIP images confirms the above findings. CT Brain Perfusion Findings: ASPECTS: 10 CBF (<30%) Volume: 73mL Perfusion (Tmax>6.0s) volume: 65mL Mismatch Volume: 42mL Infarction Location:Areas of elevated Tmax are located in the right frontal periventricular white matter and left cerebellum. These regions correspond to areas of hypoattenuation on the noncontrast head CT, including an old left cerebellar infarct. IMPRESSION: 1. No emergent large  vessel occlusion or high-grade stenosis of the intracranial arteries. 2. Area of ischemia identified in  the right frontal periventricular white matter and left cerebellum, corresponding to the areas of hypoattenuation on the noncontrast head CT and possibly artifact/pseudonormalization. No core infarct by CT perfusion analysis. 3. Aortic Atherosclerosis (ICD10-I70.0). 4. 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 Recommend annual imaging followup by CTA or MRA. This recommendation follows 2010 ACCF/AHA/AATS/ACR/ASA/SCA/SCAI/SIR/STS/SVM Guidelines for the Diagnosis and Management of Patients with Thoracic Aortic Disease. Circulation.2010; [id number]. Aortic aneurysm NOS (ICD10-I71.9) Electronically Signed: By: Ulyses Jarred M.D. On: 06/30/2019 20:03   ECHOCARDIOGRAM COMPLETE BUBBLE STUDY  Result Date: 07/01/2019    ECHOCARDIOGRAM REPORT   Patient Name:   Russell Russell Date of Exam: 07/01/2019 Medical Rec #:  [id number]      Height:       70.0 in Accession #:    [id number]     Weight:       151.0 lb Date of Birth:  09-Feb-1951     BSA:          1.852 m Patient Age:    38 years       BP:           161/130 mmHg Patient Gender: M              HR:           83 bpm. Exam Location:  Inpatient Procedure: 2D Echo, Color Doppler, Cardiac Doppler and Saline Contrast Bubble            Study Indications:    Stroke i163.9  History:        Patient has no prior history of Echocardiogram examinations.                 Risk Factors:Hypertension and Dyslipidemia.  Sonographer:    Raquel Sarna Senior RDCS Referring Phys: Jane Lew  Sonographer Comments: Technically difficult study due to poor echo windows, suboptimal parasternal window and suboptimal apical window. Technically difficult due to lung interference and respiratory variation. Patient coughing and moving thoughout exam. No parasternal, apical, or suprasternal window. IMPRESSIONS  1. Very poor study. All views basically obtained from the subcostal views.  2. Left ventricular  ejection fraction, by estimation, is 55 to 60%. The left ventricle has normal function. The left ventricle has no regional wall motion abnormalities. Left ventricular diastolic function could not be evaluated.  3. Right ventricular systolic function is normal. The right ventricular size is normal.  4. The mitral valve is grossly normal. No evidence of mitral valve regurgitation. No evidence of mitral stenosis.  5. The aortic valve is grossly normal. Aortic valve regurgitation is not visualized. No aortic stenosis is present.  6. The inferior vena cava is normal in size with greater than 50% respiratory variability, suggesting right atrial pressure of 3 mmHg.  7. Agitated saline contrast bubble study was negative, with no evidence of any interatrial shunt. Conclusion(s)/Recommendation(s): No intracardiac source of embolism detected on this transthoracic study. A transesophageal echocardiogram is recommended to exclude cardiac source of embolism if clinically indicated. FINDINGS  Left Ventricle: Left ventricular ejection fraction, by estimation, is 55 to 60%. The left ventricle has normal function. The left ventricle has no regional wall motion abnormalities. The left ventricular internal cavity size was normal in size. There is  no left ventricular hypertrophy. Left ventricular diastolic function could not be evaluated due to nondiagnostic images. Left ventricular diastolic function could not be evaluated. Right Ventricle: The right ventricular size is normal. No increase in right ventricular wall thickness. Right ventricular systolic function is normal. Left Atrium:  Left atrial size was normal in size. Right Atrium: Right atrial size was normal in size. Pericardium: Trivial pericardial effusion is present. Mitral Valve: The mitral valve is grossly normal. No evidence of mitral valve regurgitation. No evidence of mitral valve stenosis. Tricuspid Valve: The tricuspid valve is grossly normal. Tricuspid valve  regurgitation is trivial. No evidence of tricuspid stenosis. Aortic Valve: The aortic valve is grossly normal. Aortic valve regurgitation is not visualized. No aortic stenosis is present. Pulmonic Valve: The pulmonic valve was grossly normal. Pulmonic valve regurgitation is not visualized. No evidence of pulmonic stenosis. Aorta: The aortic root is normal in size and structure. Venous: The inferior vena cava is normal in size with greater than 50% respiratory variability, suggesting right atrial pressure of 3 mmHg. IAS/Shunts: No atrial level shunt detected by color flow Doppler. Agitated saline contrast was given intravenously to evaluate for intracardiac shunting. Agitated saline contrast bubble study was negative, with no evidence of any interatrial shunt.  LEFT VENTRICLE PLAX 2D LVIDd:         3.80 cm  Diastology LVIDs:         2.50 cm  LV e' lateral: 6.20 cm/s LV PW:         1.10 cm  LV e' medial:  4.57 cm/s LV IVS:        1.00 cm LVOT diam:     2.10 cm LV SV:         48 LV SV Index:   26 LVOT Area:     3.46 cm  RIGHT VENTRICLE RV S prime:     9.46 cm/s TAPSE (M-mode): 1.8 cm LEFT ATRIUM           Index       RIGHT ATRIUM           Index LA diam:      3.30 cm 1.78 cm/m  RA Area:     19.00 cm LA Vol (A4C): 50.4 ml 27.18 ml/m RA Volume:   57.90 ml  31.26 ml/m  AORTIC VALVE LVOT Vmax:   64.00 cm/s LVOT Vmean:  50.200 cm/s LVOT VTI:    0.140 m  AORTA Ao Root diam: 3.60 cm  SHUNTS Systemic VTI:  0.14 m Systemic Diam: 2.10 cm Eleonore Chiquito MD Electronically signed by Eleonore Chiquito MD Signature Date/Time: 07/01/2019/3:25:46 PM    Final    CT HEAD CODE STROKE WO CONTRAST  Result Date: 06/30/2019 CLINICAL DATA:  Code stroke.  Left-sided weakness EXAM: CT HEAD WITHOUT CONTRAST TECHNIQUE: Contiguous axial images were obtained from the base of the skull through the vertex without intravenous contrast. COMPARISON:  None. FINDINGS: Brain: There is no mass, hemorrhage or extra-axial collection. There is generalized  atrophy without lobar predilection. There is hypoattenuation of the periventricular white matter, most commonly indicating chronic ischemic microangiopathy. Old left basal ganglia small vessel infarct. Vascular: No abnormal hyperdensity of the major intracranial arteries or dural venous sinuses. No intracranial atherosclerosis. Skull: The visualized skull base, calvarium and extracranial soft tissues are normal. Sinuses/Orbits: No fluid levels or advanced mucosal thickening of the visualized paranasal sinuses. No mastoid or middle ear effusion. The orbits are normal. ASPECTS [facility] Stroke Program Early CT Score) - Ganglionic level infarction (caudate, lentiform nuclei, internal capsule, insula, M1-M3 cortex): 7 - Supraganglionic infarction (M4-M6 cortex): 3 Total score (0-10 with 10 being normal): 10 IMPRESSION: 1. Chronic ischemic microangiopathy and generalized atrophy without acute intracranial abnormality. 2. ASPECTS is 10. * These results were communicated to Dr. Kerney Elbe  at 7:30 pm on 06/30/2019 by text page via the [facility] messaging system. Electronically Signed   By: Ulyses Jarred M.D.   On: 06/30/2019 19:30   VAS US CAROTID  Result Date: 07/02/2019 Carotid Arterial Duplex Study Indications:       CVA and follow up possible carotid web versus thrombus seen                    on CT. Comparison Study:  No prior study Performing Technologist: Maudry Mayhew MHA, RDMS, RVT, RDCS  Examination Guidelines: A complete evaluation includes B-mode imaging, spectral Doppler, color Doppler, and power Doppler as needed of all accessible portions of each vessel. Bilateral testing is considered an integral part of a complete examination. Limited examinations for reoccurring indications may be performed as noted.  Right Carotid Findings: +----------+--------+--------+--------+------------------+--------+           PSV cm/sEDV cm/sStenosisPlaque DescriptionComments  +----------+--------+--------+--------+------------------+--------+ CCA Prox  51      14                                         +----------+--------+--------+--------+------------------+--------+ CCA Distal44      16                                         +----------+--------+--------+--------+------------------+--------+ ICA Prox  17      4                                          +----------+--------+--------+--------+------------------+--------+ ICA Distal59      26                                         +----------+--------+--------+--------+------------------+--------+ ECA       44      14                                         +----------+--------+--------+--------+------------------+--------+   Summary: Right Carotid: Velocities in the right ICA are consistent with a 1-39% stenosis.                There is a heterogenous mobile area of the anterior vessel wall                at the carotid bulb/proximal ICA (Images 16-19). Cannot                differentiate between possible carotid web versus thrombus versus                unknown etiology.  *See table(s) above for measurements and observations.  Electronically signed by Antony Contras MD on 07/02/2019 at 39:44:11 AM.   Final    VAS [place] LOWER EXTREMITY VENOUS (DVT)  Result Date: 07/02/2019  Lower Venous DVTStudy Indications: Stroke.  Risk Factors: None identified. Comparison Study: No prior studies. Performing Technologist: Oliver Hum RVT  Examination Guidelines: A complete evaluation includes B-mode imaging, spectral Doppler, color Doppler, and power Doppler as needed of all accessible portions of  each vessel. Bilateral testing is considered an integral part of a complete examination. Limited examinations for reoccurring indications may be performed as noted. The reflux portion of the exam is performed with the patient in reverse Trendelenburg.   +---------+---------------+---------+-----------+----------+--------------+ RIGHT    CompressibilityPhasicitySpontaneityPropertiesThrombus Aging +---------+---------------+---------+-----------+----------+--------------+ CFV      Full           Yes      Yes                                 +---------+---------------+---------+-----------+----------+--------------+ SFJ      Full                                                        +---------+---------------+---------+-----------+----------+--------------+ FV Prox  Full                                                        +---------+---------------+---------+-----------+----------+--------------+ FV Mid   Full                                                        +---------+---------------+---------+-----------+----------+--------------+ FV DistalFull                                                        +---------+---------------+---------+-----------+----------+--------------+ PFV      Full                                                        +---------+---------------+---------+-----------+----------+--------------+ POP      Full           Yes      Yes                                 +---------+---------------+---------+-----------+----------+--------------+ PTV      Full                                                        +---------+---------------+---------+-----------+----------+--------------+ PERO     Full                                                        +---------+---------------+---------+-----------+----------+--------------+   +---------+---------------+---------+-----------+----------+--------------+ LEFT     CompressibilityPhasicitySpontaneityPropertiesThrombus  Aging +---------+---------------+---------+-----------+----------+--------------+ CFV      Full           Yes      Yes                                  +---------+---------------+---------+-----------+----------+--------------+ SFJ      Full                                                        +---------+---------------+---------+-----------+----------+--------------+ FV Prox  Full                                                        +---------+---------------+---------+-----------+----------+--------------+ FV Mid   Full                                                        +---------+---------------+---------+-----------+----------+--------------+ FV DistalFull                                                        +---------+---------------+---------+-----------+----------+--------------+ PFV      Full                                                        +---------+---------------+---------+-----------+----------+--------------+ POP      Full           Yes      Yes                                 +---------+---------------+---------+-----------+----------+--------------+ PTV      Full                                                        +---------+---------------+---------+-----------+----------+--------------+ PERO     Full                                                        +---------+---------------+---------+-----------+----------+--------------+     Summary: RIGHT: - There is no evidence of deep vein thrombosis in the lower extremity.  - No cystic structure found in the popliteal fossa.  LEFT: - There is no evidence of deep vein thrombosis in the lower extremity.  - No cystic structure found in the popliteal  fossa.  *See table(s) above for measurements and observations. Electronically signed by Monica Martinez MD on 07/02/2019 at 8:09:03 PM.    Final     Phillips Climes M.D on 07/03/2019 at 2:29 PM  Between 7am to 7pm - Pager - [phone]  After 7pm go to [email or web address] - password [facility]  [facility] -  Office  [phone]

## 2019-07-04 LAB — BASIC METABOLIC PANEL
Anion gap: 10 (ref 5–15)
BUN: 8 mg/dL (ref 8–23)
CO2: 27 mmol/L (ref 22–32)
Calcium: 9 mg/dL (ref 8.9–10.3)
Chloride: 100 mmol/L (ref 98–111)
Creatinine, Ser: 1.1 mg/dL (ref 0.61–1.24)
GFR calc Af Amer: >60 mL/min (ref >60)
GFR calc non Af Amer: >60 mL/min (ref >60)
Glucose, Bld: 95 mg/dL (ref 70–99)
Potassium: 3.5 mmol/L (ref 3.5–5.1)
Sodium: 137 mmol/L (ref 135–145)

## 2019-07-04 LAB — CBC
HCT: 53.3 % — ABNORMAL HIGH (ref 39.0–52.0)
Hemoglobin: 18.1 g/dL — ABNORMAL HIGH (ref 13.0–17.0)
MCH: 31.5 pg (ref 26.0–34.0)
MCHC: 34 g/dL (ref 30.0–36.0)
MCV: 92.7 fL (ref 80.0–100.0)
Platelets: 206 10*3/uL (ref 150–400)
RBC: 5.75 MIL/uL (ref 4.22–5.81)
RDW: 16 % — ABNORMAL HIGH (ref 11.5–15.5)
WBC: 4.2 10*3/uL (ref 4.0–10.5)
nRBC: 0 % (ref 0.0–0.2)

## 2019-07-04 MED ORDER — POTASSIUM CHLORIDE CRYS ER 20 MEQ PO TBCR
40.0000 meq | EXTENDED_RELEASE_TABLET | Freq: Once | ORAL | Status: AC
  Administered 2019-07-04: 40 meq via ORAL
  Filled 2019-07-04: qty 2

## 2019-07-04 MED ORDER — NICOTINE 21 MG/24HR TD PT24
21.0000 mg | MEDICATED_PATCH | Freq: Every day | TRANSDERMAL | Status: DC
  Administered 2019-07-04 – 2019-07-07 (×4): 21 mg via TRANSDERMAL
  Filled 2019-07-04 (×4): qty 1

## 2019-07-04 NOTE — Progress Notes (Addendum)
PROGRESS NOTE                                                                                                                                                                                                             Patient Demographics:    Russell Russell, is a 69 y.o. male, DOB - 12-09-1950, [id number]  Admit date - 06/30/2019   Admitting Physician Dwyane Dee, MD  Outpatient Primary MD for the patient is Inda Coke, [place]  LOS - 4   Chief Complaint  Patient presents with  . Code Stroke       Brief Narrative    Russell Russell is a 69 y.o. male with history of HTN, HLD, tobacco and alcohol abuse presenting with L sided weakness, MRI significant for acute CVA, he was admitted for further work-up.   Subjective:    Russell Russell today has, No headache, No chest pain, No abdominal pain , does report some improvement in his left-sided weakness today, denies any chest pain or shortness of breath.   Assessment  & Plan :    Active Problems:   Stroke [facility])   Acute ischemic stroke (HCC)   Alcohol abuse   Dyslipidemia   Dysphagia, post-stroke   Polycythemia    Acute CVA: -Neurology input greatly appreciated, evidence of multiple scattered anterior and posterior right brain infarct, likely embolic, secondary to unclear source. - CTA head & neck no ELVO. Aortic atherosclerosis. 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  Subtle R ICA bulb indistinct filling defect possible small carotid web vs less likely thrombus. - CT perfusion R frontal periventricular white matter and L cerebellar artifact/pseudonormalization. No core infarct.  - MRI  Multiple scattered posterior R basal ganglia, R cerebral, R cerebellar infarcts.  Small vessel disease. Atrophy. Multiple old lacunes B basal ganglia, thalami, pons and L cerebellar infarct  -2D echo with a preserved EF 55 to 60%, with no source of embolus -Lower extremity venous Doppler with no DVT -Admit to  neurology, likely will need loop recorder to rule out A. Fib.  No evidence of A. fib on telemetry. -Currently on aspirin and Plavix 75 mg oral daily, need to continue for 38-month, and then aspirin alone -PT/OT> CIR consulted, plan for SNF placement. -SLP consulted: Dysphagia 2  -Recorder has been inserted by EP 4/23 per neurology recommendation.  Hypertension/hypertensive urgency -Allow  for permissive hypertension -Now blood pressure has started to increase, I have stopped his IV fluids , he was started on low-dose Norvasc, blood pressure seems to be acceptable .  Hyperlipidemia -Should be on Lipitor 40 mg oral daily, but he is noncompliant, LDL is 109, goal is less than 70.  Patient will right carotid filling defect -Web versus thrombus, will continue dual antiplatelet therapy for 3 months, then repeat CTA neck and CUS for further evaluation  Alcohol abuse -On folic acid, B1 and multivitamin, on CIWA protocol, no evidence of withdrawals  Polycythemia -Likely secondary to tobacco abuse, follow-up JAK2 mutation. - Polycythemia HCT 19.2-20.1-18.0 - smoker  Tobacco abuse -He was counseled, patient requesting nicotine patch which has been provided     Code Status : Full  Family Communication  : wife at bedsdie  Disposition Plan  :  Status is: Inpatient   Dispo: The patient is from: Home              Anticipated d/c is to: SNF               Anticipated d/c date is: 1 day              Patient currently is not medically stable to d/c. will need lop recorder.  Consults  :  neurology  Procedures  : None  DVT Prophylaxis  :  Lovenox  Lab Results  Component Value Date   PLT 206 07/04/2019    Antibiotics  :    Anti-infectives (From admission, onward)   None        Objective:   Vitals:   07/04/19 0000 07/04/19 0400 07/04/19 0810 07/04/19 1255  BP: (!) 149/108 (!) 150/112 (!) 149/109 (!) 136/94  Pulse:    90  Resp: 16 (!) 21 18 20   Temp: 97.8 F (36.6 C) 98 F  (36.7 C) 98.2 F (36.8 C)   TempSrc: Oral Oral Oral   SpO2:   93%   Weight:      Height:        Wt Readings from Last 3 Encounters:  07/03/19 63.2 kg  04/24/19 65.4 kg  03/24/19 66.9 kg     Intake/Output Summary (Last 24 hours) at 07/04/2019 1500 Last data filed at 07/04/2019 0900 Gross per 24 hour  Intake 360 ml  Output 700 ml  Net -340 ml     Physical Exam  Awake Alert, Oriented X 3, No new F.N deficits, Normal affect, left-sided upper and lower extremity weakness seems to be improving Symmetrical Chest wall movement, Good air movement bilaterally, CTAB RRR,No Gallops,Rubs or new Murmurs, No Parasternal Heave +ve B.Sounds, Abd Soft, No tenderness, No rebound - guarding or rigidity. No Cyanosis, Clubbing or edema, No new Rash or bruise         Data Review:    CBC Recent Labs  Lab 06/30/19 1919 06/30/19 1926 07/01/19 0315 07/02/19 0636 07/04/19 0418  WBC 4.6  --  3.4* 4.1 4.2  HGB 19.2* 20.1* 18.0* 18.1* 18.1*  HCT 54.7* 59.0* 52.3* 52.1* 53.3*  PLT 190  --  216 190 206  MCV 91.8  --  92.2 92.7 92.7  MCH 32.2  --  31.7 32.2 31.5  MCHC 35.1  --  34.4 34.7 34.0  RDW 16.0*  --  15.8* 15.9* 16.0*  LYMPHSABS 0.7  --   --   --   --   MONOABS 0.5  --   --   --   --  EOSABS 0.0  --   --   --   --   BASOSABS 0.0  --   --   --   --     Chemistries  Recent Labs  Lab 06/30/19 1919 06/30/19 1919 06/30/19 1926 07/01/19 0315 07/02/19 0636 07/03/19 0703 07/04/19 0418  NA 136   < > 137 138 136 135 137  K 4.0   < > 3.8 3.5 3.9 3.7 3.5  CL 101   < > 101 102 102 101 100  CO2 23  --   --  19* 24 24 27   GLUCOSE 134*   < > 132* 133* 101* 92 95  BUN 7*   < > 8 6* <5* <5* 8  CREATININE 1.16   < > 0.90 0.98 0.94 1.02 1.10  CALCIUM 9.4  --   --  9.1 8.8* 8.9 9.0  MG  --   --   --  1.7  --   --   --   AST 53*  --   --   --   --   --   --   ALT 29  --   --   --   --   --   --   ALKPHOS 100  --   --   --   --   --   --   BILITOT 1.4*  --   --   --   --   --   --      < > = values in this interval not displayed.   ------------------------------------------------------------------------------------------------------------------ No results for input(s): CHOL, HDL, LDLCALC, TRIG, CHOLHDL, LDLDIRECT in the last 72 hours.  Lab Results  Component Value Date   HGBA1C 6.2 (H) 07/01/2019   ------------------------------------------------------------------------------------------------------------------ No results for input(s): TSH, T4TOTAL, T3FREE, THYROIDAB in the last 72 hours.  Invalid input(s): FREET3 ------------------------------------------------------------------------------------------------------------------ No results for input(s): VITAMINB12, FOLATE, FERRITIN, TIBC, IRON, RETICCTPCT in the last 72 hours.  Coagulation profile Recent Labs  Lab 06/30/19 1955  INR 1.0    No results for input(s): DDIMER in the last 72 hours.  Cardiac Enzymes No results for input(s): CKMB, TROPONINI, MYOGLOBIN in the last 168 hours.  Invalid input(s): CK ------------------------------------------------------------------------------------------------------------------ No results found for: BNP  Inpatient Medications  Scheduled Meds: . amLODipine  5 mg Oral Daily  . aspirin  300 mg Rectal Daily   Or  . aspirin  325 mg Oral Daily  . atorvastatin  40 mg Oral Daily  . chlorhexidine  15 mL Mouth Rinse BID  . clopidogrel  75 mg Oral Daily  . enoxaparin (LOVENOX) injection  40 mg Subcutaneous Q24H  . feeding supplement (ENSURE ENLIVE)  237 mL Oral BID BM  . folic acid  1 mg Intravenous Daily  . LORazepam  0-4 mg Intravenous Q12H  . mouth rinse  15 mL Mouth Rinse q12n4p  . multivitamin with minerals  1 tablet Oral Daily  . nicotine  21 mg Transdermal Daily  . pantoprazole  40 mg Oral Daily  . thiamine  100 mg Oral Daily   Or  . thiamine  100 mg Intravenous Daily   Continuous Infusions:  PRN Meds:.acetaminophen **OR** acetaminophen (TYLENOL) oral  liquid 160 mg/5 mL **OR** acetaminophen, labetalol, senna-docusate  Micro Results Recent Results (from the past 240 hour(s))  SARS CORONAVIRUS 2 (TAT 6-24 HRS) Nasopharyngeal Nasopharyngeal Swab     Status: None   Collection Time: 06/30/19  9:58 PM   Specimen: Nasopharyngeal Swab  Result Value Ref Range  Status   SARS Coronavirus 2 NEGATIVE NEGATIVE Final    Comment: (NOTE) SARS-CoV-2 target nucleic acids are NOT DETECTED. The SARS-CoV-2 RNA is generally detectable in upper and lower respiratory specimens during the acute phase of infection. Negative results do not preclude SARS-CoV-2 infection, do not rule out co-infections with other pathogens, and should not be used as the sole basis for treatment or other patient management decisions. Negative results must be combined with clinical observations, patient history, and epidemiological information. The expected result is Negative. Fact Sheet for Patients: SugarRoll.be Fact Sheet for Healthcare Providers: https://www.woods-mathews.com/ This test is not yet approved or cleared by the [place] FDA and  has been authorized for detection and/or diagnosis of SARS-CoV-2 by FDA under an Emergency Use Authorization (EUA). This EUA will remain  in effect (meaning this test can be used) for the duration of the COVID-19 declaration under Section 56 4(b)(1) of the Act, 21 U.S.C. section 360bbb-3(b)(1), unless the authorization is terminated or revoked sooner. Performed at [facility], [place] [address], [place], [place] [address]     Radiology Reports CT Code Stroke CTA Head W/WO contrast  Addendum Date: 07/01/2019   ADDENDUM REPORT: 07/01/2019 09:49 ADDENDUM: Study discussed by telephone with Dr. Erlinda Hong on 07/01/2019 at 0930 hours. There is subtle heterogeneity at the lateral right ICA bulb (series 4, image 108) suggesting a small, indistinct filling defect in the vessel. Perhaps this is a small  carotid web, or less likely adherent thrombus, with some flow related motion. We discussed that I think Carotid Doppler Ultrasound focused on the Right ICA bulb would best evaluate these possibilities. Electronically Signed   By: Genevie Ann M.D.   On: 07/01/2019 09:49   Result Date: 07/01/2019 CLINICAL DATA:  Left-sided weakness EXAM: CT ANGIOGRAPHY HEAD AND NECK CT PERFUSION BRAIN TECHNIQUE: Multidetector CT imaging of the head and neck was performed using the standard protocol during bolus administration of intravenous contrast. Multiplanar CT image reconstructions and MIPs were obtained to evaluate the vascular anatomy. Carotid stenosis measurements (when applicable) are obtained utilizing NASCET criteria, using the distal internal carotid diameter as the denominator. Multiphase CT imaging of the brain was performed following IV bolus contrast injection. Subsequent parametric perfusion maps were calculated using RAPID software. CONTRAST:  113mL OMNIPAQUE IOHEXOL 350 MG/ML SOLN COMPARISON:  None. FINDINGS: CTA NECK FINDINGS SKELETON: There is no bony spinal canal stenosis. No lytic or blastic lesion. OTHER NECK: Normal pharynx, larynx and major salivary glands. No cervical lymphadenopathy. Unremarkable thyroid gland. UPPER CHEST: No pneumothorax or pleural effusion. No nodules or masses. AORTIC ARCH: There is mild calcific atherosclerosis of the aortic arch. 3.6 cm diameter of descending thoracic aorta arch. Conventional 3 vessel aortic branching pattern. The visualized proximal subclavian arteries are widely patent. RIGHT CAROTID SYSTEM: Normal without aneurysm, dissection or stenosis. LEFT CAROTID SYSTEM: Normal without aneurysm, dissection or stenosis. VERTEBRAL ARTERIES: Right dominant configuration. Both origins are clearly patent. There is no dissection, occlusion or flow-limiting stenosis to the skull base (V1-V3 segments). CTA HEAD FINDINGS POSTERIOR CIRCULATION: --Vertebral arteries: Normal V4 segments.  --Posterior inferior cerebellar arteries (PICA): Patent origins from the vertebral arteries. --Anterior inferior cerebellar arteries (AICA): Patent origins from the basilar artery. --Basilar artery: Normal. --Superior cerebellar arteries: Normal. --Posterior cerebral arteries: Normal. Both originate from the basilar artery. Posterior communicating arteries (p-comm) are diminutive or absent. ANTERIOR CIRCULATION: --Intracranial internal carotid arteries: Normal. --Anterior cerebral arteries (ACA): Normal. Both A1 segments are present. Patent anterior communicating artery (a-comm). --Middle cerebral arteries (MCA):  Normal. VENOUS SINUSES: As permitted by contrast timing, patent. ANATOMIC VARIANTS: None Review of the MIP images confirms the above findings. CT Brain Perfusion Findings: ASPECTS: 10 CBF (<30%) Volume: 55mL Perfusion (Tmax>6.0s) volume: 100mL Mismatch Volume: 75mL Infarction Location:Areas of elevated Tmax are located in the right frontal periventricular white matter and left cerebellum. These regions correspond to areas of hypoattenuation on the noncontrast head CT, including an old left cerebellar infarct. IMPRESSION: 1. No emergent large vessel occlusion or high-grade stenosis of the intracranial arteries. 2. Area of ischemia identified in the right frontal periventricular white matter and left cerebellum, corresponding to the areas of hypoattenuation on the noncontrast head CT and possibly artifact/pseudonormalization. No core infarct by CT perfusion analysis. 3. Aortic Atherosclerosis (ICD10-I70.0). 4. 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 Recommend annual imaging followup by CTA or MRA. This recommendation follows 2010 ACCF/AHA/AATS/ACR/ASA/SCA/SCAI/SIR/STS/SVM Guidelines for the Diagnosis and Management of Patients with Thoracic Aortic Disease. Circulation.2010; 121JN:[id number]. Aortic aneurysm NOS (ICD10-I71.9) Electronically Signed: By: Ulyses Jarred M.D. On: 06/30/2019 20:03   CT Code Stroke CTA Neck W/WO  contrast  Addendum Date: 07/01/2019   ADDENDUM REPORT: 07/01/2019 09:49 ADDENDUM: Study discussed by telephone with Dr. Erlinda Hong on 07/01/2019 at 0930 hours. There is subtle heterogeneity at the lateral right ICA bulb (series 4, image 108) suggesting a small, indistinct filling defect in the vessel. Perhaps this is a small carotid web, or less likely adherent thrombus, with some flow related motion. We discussed that I think Carotid Doppler Ultrasound focused on the Right ICA bulb would best evaluate these possibilities. Electronically Signed   By: Genevie Ann M.D.   On: 07/01/2019 09:49   Result Date: 07/01/2019 CLINICAL DATA:  Left-sided weakness EXAM: CT ANGIOGRAPHY HEAD AND NECK CT PERFUSION BRAIN TECHNIQUE: Multidetector CT imaging of the head and neck was performed using the standard protocol during bolus administration of intravenous contrast. Multiplanar CT image reconstructions and MIPs were obtained to evaluate the vascular anatomy. Carotid stenosis measurements (when applicable) are obtained utilizing NASCET criteria, using the distal internal carotid diameter as the denominator. Multiphase CT imaging of the brain was performed following IV bolus contrast injection. Subsequent parametric perfusion maps were calculated using RAPID software. CONTRAST:  162mL OMNIPAQUE IOHEXOL 350 MG/ML SOLN COMPARISON:  None. FINDINGS: CTA NECK FINDINGS SKELETON: There is no bony spinal canal stenosis. No lytic or blastic lesion. OTHER NECK: Normal pharynx, larynx and major salivary glands. No cervical lymphadenopathy. Unremarkable thyroid gland. UPPER CHEST: No pneumothorax or pleural effusion. No nodules or masses. AORTIC ARCH: There is mild calcific atherosclerosis of the aortic arch. 3.6 cm diameter of descending thoracic aorta arch. Conventional 3 vessel aortic branching pattern. The visualized proximal subclavian arteries are widely patent. RIGHT CAROTID SYSTEM: Normal without aneurysm, dissection or stenosis. LEFT CAROTID  SYSTEM: Normal without aneurysm, dissection or stenosis. VERTEBRAL ARTERIES: Right dominant configuration. Both origins are clearly patent. There is no dissection, occlusion or flow-limiting stenosis to the skull base (V1-V3 segments). CTA HEAD FINDINGS POSTERIOR CIRCULATION: --Vertebral arteries: Normal V4 segments. --Posterior inferior cerebellar arteries (PICA): Patent origins from the vertebral arteries. --Anterior inferior cerebellar arteries (AICA): Patent origins from the basilar artery. --Basilar artery: Normal. --Superior cerebellar arteries: Normal. --Posterior cerebral arteries: Normal. Both originate from the basilar artery. Posterior communicating arteries (p-comm) are diminutive or absent. ANTERIOR CIRCULATION: --Intracranial internal carotid arteries: Normal. --Anterior cerebral arteries (ACA): Normal. Both A1 segments are present. Patent anterior communicating artery (a-comm). --Middle cerebral arteries (MCA): Normal. VENOUS SINUSES: As permitted by contrast timing, patent. ANATOMIC VARIANTS: None  Review of the MIP images confirms the above findings. CT Brain Perfusion Findings: ASPECTS: 10 CBF (<30%) Volume: 62mL Perfusion (Tmax>6.0s) volume: 21mL Mismatch Volume: 46mL Infarction Location:Areas of elevated Tmax are located in the right frontal periventricular white matter and left cerebellum. These regions correspond to areas of hypoattenuation on the noncontrast head CT, including an old left cerebellar infarct. IMPRESSION: 1. No emergent large vessel occlusion or high-grade stenosis of the intracranial arteries. 2. Area of ischemia identified in the right frontal periventricular white matter and left cerebellum, corresponding to the areas of hypoattenuation on the noncontrast head CT and possibly artifact/pseudonormalization. No core infarct by CT perfusion analysis. 3. Aortic Atherosclerosis (ICD10-I70.0). 4. 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 Recommend annual imaging followup by CTA or MRA. This  recommendation follows 2010 ACCF/AHA/AATS/ACR/ASA/SCA/SCAI/SIR/STS/SVM Guidelines for the Diagnosis and Management of Patients with Thoracic Aortic Disease. Circulation.2010; [id number]. Aortic aneurysm NOS (ICD10-I71.9) Electronically Signed: By: Ulyses Jarred M.D. On: 06/30/2019 20:03   MR BRAIN WO CONTRAST  Result Date: 07/01/2019 CLINICAL DATA:  Follow-up examination for acute stroke. EXAM: MRI HEAD WITHOUT CONTRAST TECHNIQUE: Multiplanar, multiecho pulse sequences of the brain and surrounding structures were obtained without intravenous contrast. COMPARISON:  Prior CTs from 06/30/2019. FINDINGS: Brain: Examination degraded by motion artifact. Generalized age-related cerebral atrophy. Extensive patchy and confluent T2/FLAIR hyperintensity within the periventricular deep white matter both cerebral hemispheres as well as the pons, most consistent with chronic small vessel ischemic disease, fairly advanced in nature. Multiple superimposed remote lacunar infarcts seen about the bilateral basal ganglia, thalami, and pons. Chronic left cerebellar infarct noted as well. Approximate 2.5 cm linear focus of restricted diffusion seen extending from the posterior right lentiform nucleus into the posterior right caudate and corona radiata, consistent with an acute ischemic perforator type infarct (series 5, image 73). Few additional scattered ischemic infarcts seen involving the subcortical white matter of the overlying right frontal lobe (w series 5, image 83, 81). Additional subcentimeter acute ischemic infarct noted within the subcortical right occipital lobe (series 5, image 63). Approximate 1 cm acute ischemic infarcts seen involving the inferior right cerebellum (series 5, image 53). No definite associated hemorrhage about these infarcts. No associated mass effect. No mass lesion or midline shift. Mild ventricular prominence related to global parenchymal volume loss without hydrocephalus. No extra-axial fluid  collection. Pituitary gland suprasellar region within normal limits. Midline structures intact. Vascular: Major intracranial vascular flow voids are maintained. Skull and upper cervical spine: Craniocervical junction within normal limits. Bone marrow signal intensity normal. No scalp soft tissue abnormality. Sinuses/Orbits: Globes and orbital soft tissues within normal limits. Mild scattered mucosal thickening noted throughout the paranasal sinuses. No mastoid effusion. Inner ear structures normal. Other: None. IMPRESSION: 1. Multiple scattered acute ischemic infarcts involving the posterior right basal ganglia, right cerebral hemisphere, and right cerebellum as above. No associated hemorrhage or mass effect. 2. Underlying atrophy with advanced chronic microvascular ischemic disease, with multiple remote lacunar infarcts about the bilateral basal ganglia, thalami, and pons, with additional chronic left cerebellar infarct. Electronically Signed   By: Jeannine Boga M.D.   On: 07/01/2019 01:29   EP PPM/ICD IMPLANT  Result Date: 07/03/2019 SURGEON:  Thompson Grayer, MD   PREPROCEDURE DIAGNOSIS:  Cryptogenic Stroke   POSTPROCEDURE DIAGNOSIS:  Cryptogenic Stroke    PROCEDURES:  1. Implantable loop recorder implantation   INTRODUCTION:  Bertram Gipe is a 69 y.o. male with a history of unexplained stroke who presents today for implantable loop implantation.  The patient has had a cryptogenic  stroke.  Despite an extensive workup by neurology, no reversible causes have been identified.  he has worn telemetry during which he did not have arrhythmias.  There is significant concern for possible atrial fibrillation as the cause for the patients stroke.  The patient therefore presents today for implantable loop implantation.   DESCRIPTION OF PROCEDURE:  Informed written consent was obtained.  The patient required no sedation for the procedure today.  The patients left chest was prepped and draped. Mapping over the  patient's chest was performed to identify the appropriate ILR site.  This area was found to be the left parasternal region over the 3rd-4th intercostal space.  The skin overlying this region was infiltrated with lidocaine for local analgesia.  A 0.5-cm incision was made at the implant site.  A subcutaneous ILR pocket was fashioned using a combination of sharp and blunt dissection.  A Medtronic Reveal Linq model [id number] implantable loop recorder was then placed into the pocket R waves were very prominent and measured > 0.2 mV. EBL<1 ml.  Steri- Strips and a sterile dressing were then applied.  There were no early apparent complications.   CONCLUSIONS:  1. Successful implantation of a Medtronic Reveal LINQ implantable loop recorder for cryptogenic stroke  2. No early apparent complications. Thompson Grayer MD, [facility] 07/03/2019 12:57 PM   CT Code Stroke Cerebral Perfusion with contrast  Addendum Date: 07/01/2019   ADDENDUM REPORT: 07/01/2019 09:49 ADDENDUM: Study discussed by telephone with Dr. Erlinda Hong on 07/01/2019 at 0930 hours. There is subtle heterogeneity at the lateral right ICA bulb (series 4, image 108) suggesting a small, indistinct filling defect in the vessel. Perhaps this is a small carotid web, or less likely adherent thrombus, with some flow related motion. We discussed that I think Carotid Doppler Ultrasound focused on the Right ICA bulb would best evaluate these possibilities. Electronically Signed   By: Genevie Ann M.D.   On: 07/01/2019 09:49   Result Date: 07/01/2019 CLINICAL DATA:  Left-sided weakness EXAM: CT ANGIOGRAPHY HEAD AND NECK CT PERFUSION BRAIN TECHNIQUE: Multidetector CT imaging of the head and neck was performed using the standard protocol during bolus administration of intravenous contrast. Multiplanar CT image reconstructions and MIPs were obtained to evaluate the vascular anatomy. Carotid stenosis measurements (when applicable) are obtained utilizing NASCET criteria, using the distal internal  carotid diameter as the denominator. Multiphase CT imaging of the brain was performed following IV bolus contrast injection. Subsequent parametric perfusion maps were calculated using RAPID software. CONTRAST:  17mL OMNIPAQUE IOHEXOL 350 MG/ML SOLN COMPARISON:  None. FINDINGS: CTA NECK FINDINGS SKELETON: There is no bony spinal canal stenosis. No lytic or blastic lesion. OTHER NECK: Normal pharynx, larynx and major salivary glands. No cervical lymphadenopathy. Unremarkable thyroid gland. UPPER CHEST: No pneumothorax or pleural effusion. No nodules or masses. AORTIC ARCH: There is mild calcific atherosclerosis of the aortic arch. 3.6 cm diameter of descending thoracic aorta arch. Conventional 3 vessel aortic branching pattern. The visualized proximal subclavian arteries are widely patent. RIGHT CAROTID SYSTEM: Normal without aneurysm, dissection or stenosis. LEFT CAROTID SYSTEM: Normal without aneurysm, dissection or stenosis. VERTEBRAL ARTERIES: Right dominant configuration. Both origins are clearly patent. There is no dissection, occlusion or flow-limiting stenosis to the skull base (V1-V3 segments). CTA HEAD FINDINGS POSTERIOR CIRCULATION: --Vertebral arteries: Normal V4 segments. --Posterior inferior cerebellar arteries (PICA): Patent origins from the vertebral arteries. --Anterior inferior cerebellar arteries (AICA): Patent origins from the basilar artery. --Basilar artery: Normal. --Superior cerebellar arteries: Normal. --Posterior cerebral arteries: Normal. Both originate  from the basilar artery. Posterior communicating arteries (p-comm) are diminutive or absent. ANTERIOR CIRCULATION: --Intracranial internal carotid arteries: Normal. --Anterior cerebral arteries (ACA): Normal. Both A1 segments are present. Patent anterior communicating artery (a-comm). --Middle cerebral arteries (MCA): Normal. VENOUS SINUSES: As permitted by contrast timing, patent. ANATOMIC VARIANTS: None Review of the MIP images confirms  the above findings. CT Brain Perfusion Findings: ASPECTS: 10 CBF (<30%) Volume: 47mL Perfusion (Tmax>6.0s) volume: 60mL Mismatch Volume: 59mL Infarction Location:Areas of elevated Tmax are located in the right frontal periventricular white matter and left cerebellum. These regions correspond to areas of hypoattenuation on the noncontrast head CT, including an old left cerebellar infarct. IMPRESSION: 1. No emergent large vessel occlusion or high-grade stenosis of the intracranial arteries. 2. Area of ischemia identified in the right frontal periventricular white matter and left cerebellum, corresponding to the areas of hypoattenuation on the noncontrast head CT and possibly artifact/pseudonormalization. No core infarct by CT perfusion analysis. 3. Aortic Atherosclerosis (ICD10-I70.0). 4. 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 Recommend annual imaging followup by CTA or MRA. This recommendation follows 2010 ACCF/AHA/AATS/ACR/ASA/SCA/SCAI/SIR/STS/SVM Guidelines for the Diagnosis and Management of Patients with Thoracic Aortic Disease. Circulation.2010; [id number]. Aortic aneurysm NOS (ICD10-I71.9) Electronically Signed: By: Ulyses Jarred M.D. On: 06/30/2019 20:03   ECHOCARDIOGRAM COMPLETE BUBBLE STUDY  Result Date: 07/01/2019    ECHOCARDIOGRAM REPORT   Patient Name:   KIESHAWN SUPAN Date of Exam: 07/01/2019 Medical Rec #:  [id number]      Height:       70.0 in Accession #:    [id number]     Weight:       151.0 lb Date of Birth:  11/27/1950     BSA:          1.852 m Patient Age:    54 years       BP:           161/130 mmHg Patient Gender: M              HR:           83 bpm. Exam Location:  Inpatient Procedure: 2D Echo, Color Doppler, Cardiac Doppler and Saline Contrast Bubble            Study Indications:    Stroke i163.9  History:        Patient has no prior history of Echocardiogram examinations.                 Risk Factors:Hypertension and Dyslipidemia.  Sonographer:    Raquel Sarna Senior RDCS Referring Phys: [employer]  Sonographer Comments: Technically difficult study due to poor echo windows, suboptimal parasternal window and suboptimal apical window. Technically difficult due to lung interference and respiratory variation. Patient coughing and moving thoughout exam. No parasternal, apical, or suprasternal window. IMPRESSIONS  1. Very poor study. All views basically obtained from the subcostal views.  2. Left ventricular ejection fraction, by estimation, is 55 to 60%. The left ventricle has normal function. The left ventricle has no regional wall motion abnormalities. Left ventricular diastolic function could not be evaluated.  3. Right ventricular systolic function is normal. The right ventricular size is normal.  4. The mitral valve is grossly normal. No evidence of mitral valve regurgitation. No evidence of mitral stenosis.  5. The aortic valve is grossly normal. Aortic valve regurgitation is not visualized. No aortic stenosis is present.  6. The inferior vena cava is normal in size with greater than 50% respiratory variability, suggesting right atrial pressure of  3 mmHg.  7. Agitated saline contrast bubble study was negative, with no evidence of any interatrial shunt. Conclusion(s)/Recommendation(s): No intracardiac source of embolism detected on this transthoracic study. A transesophageal echocardiogram is recommended to exclude cardiac source of embolism if clinically indicated. FINDINGS  Left Ventricle: Left ventricular ejection fraction, by estimation, is 55 to 60%. The left ventricle has normal function. The left ventricle has no regional wall motion abnormalities. The left ventricular internal cavity size was normal in size. There is  no left ventricular hypertrophy. Left ventricular diastolic function could not be evaluated due to nondiagnostic images. Left ventricular diastolic function could not be evaluated. Right Ventricle: The right ventricular size is normal. No increase in right ventricular wall  thickness. Right ventricular systolic function is normal. Left Atrium: Left atrial size was normal in size. Right Atrium: Right atrial size was normal in size. Pericardium: Trivial pericardial effusion is present. Mitral Valve: The mitral valve is grossly normal. No evidence of mitral valve regurgitation. No evidence of mitral valve stenosis. Tricuspid Valve: The tricuspid valve is grossly normal. Tricuspid valve regurgitation is trivial. No evidence of tricuspid stenosis. Aortic Valve: The aortic valve is grossly normal. Aortic valve regurgitation is not visualized. No aortic stenosis is present. Pulmonic Valve: The pulmonic valve was grossly normal. Pulmonic valve regurgitation is not visualized. No evidence of pulmonic stenosis. Aorta: The aortic root is normal in size and structure. Venous: The inferior vena cava is normal in size with greater than 50% respiratory variability, suggesting right atrial pressure of 3 mmHg. IAS/Shunts: No atrial level shunt detected by color flow Doppler. Agitated saline contrast was given intravenously to evaluate for intracardiac shunting. Agitated saline contrast bubble study was negative, with no evidence of any interatrial shunt.  LEFT VENTRICLE PLAX 2D LVIDd:         3.80 cm  Diastology LVIDs:         2.50 cm  LV e' lateral: 6.20 cm/s LV PW:         1.10 cm  LV e' medial:  4.57 cm/s LV IVS:        1.00 cm LVOT diam:     2.10 cm LV SV:         48 LV SV Index:   26 LVOT Area:     3.46 cm  RIGHT VENTRICLE RV S prime:     9.46 cm/s TAPSE (M-mode): 1.8 cm LEFT ATRIUM           Index       RIGHT ATRIUM           Index LA diam:      3.30 cm 1.78 cm/m  RA Area:     19.00 cm LA Vol (A4C): 50.4 ml 27.18 ml/m RA Volume:   57.90 ml  31.26 ml/m  AORTIC VALVE LVOT Vmax:   64.00 cm/s LVOT Vmean:  50.200 cm/s LVOT VTI:    0.140 m  AORTA Ao Root diam: 3.60 cm  SHUNTS Systemic VTI:  0.14 m Systemic Diam: 2.10 cm Eleonore Chiquito MD Electronically signed by Eleonore Chiquito MD Signature  Date/Time: 07/01/2019/3:25:46 PM    Final    CT HEAD CODE STROKE WO CONTRAST  Result Date: 06/30/2019 CLINICAL DATA:  Code stroke.  Left-sided weakness EXAM: CT HEAD WITHOUT CONTRAST TECHNIQUE: Contiguous axial images were obtained from the base of the skull through the vertex without intravenous contrast. COMPARISON:  None. FINDINGS: Brain: There is no mass, hemorrhage or extra-axial collection. There is generalized atrophy without lobar  predilection. There is hypoattenuation of the periventricular white matter, most commonly indicating chronic ischemic microangiopathy. Old left basal ganglia small vessel infarct. Vascular: No abnormal hyperdensity of the major intracranial arteries or dural venous sinuses. No intracranial atherosclerosis. Skull: The visualized skull base, calvarium and extracranial soft tissues are normal. Sinuses/Orbits: No fluid levels or advanced mucosal thickening of the visualized paranasal sinuses. No mastoid or middle ear effusion. The orbits are normal. ASPECTS [address] [facility] Stroke Program Early CT Score) - Ganglionic level infarction (caudate, lentiform nuclei, internal capsule, insula, M1-M3 cortex): 7 - Supraganglionic infarction (M4-M6 cortex): 3 Total score (0-10 with 10 being normal): 10 IMPRESSION: 1. Chronic ischemic microangiopathy and generalized atrophy without acute intracranial abnormality. 2. ASPECTS is 10. * These results were communicated to Dr. Kerney Elbe at 7:30 pm on 06/30/2019 by text page via the [facility] messaging system. Electronically Signed   By: Ulyses Jarred M.D.   On: 06/30/2019 19:30   VAS US CAROTID  Result Date: 07/02/2019 Carotid Arterial Duplex Study Indications:       CVA and follow up possible carotid web versus thrombus seen                    on CT. Comparison Study:  No prior study Performing Technologist: Maudry Mayhew MHA, RDMS, RVT, RDCS  Examination Guidelines: A complete evaluation includes B-mode imaging, spectral Doppler, color Doppler, and  power Doppler as needed of all accessible portions of each vessel. Bilateral testing is considered an integral part of a complete examination. Limited examinations for reoccurring indications may be performed as noted.  Right Carotid Findings: +----------+--------+--------+--------+------------------+--------+           PSV cm/sEDV cm/sStenosisPlaque DescriptionComments +----------+--------+--------+--------+------------------+--------+ CCA Prox  51      14                                         +----------+--------+--------+--------+------------------+--------+ CCA Distal44      16                                         +----------+--------+--------+--------+------------------+--------+ ICA Prox  17      4                                          +----------+--------+--------+--------+------------------+--------+ ICA Distal59      26                                         +----------+--------+--------+--------+------------------+--------+ ECA       44      14                                         +----------+--------+--------+--------+------------------+--------+   Summary: Right Carotid: Velocities in the right ICA are consistent with a 1-39% stenosis.                There is a heterogenous mobile area of the anterior vessel wall  at the carotid bulb/proximal ICA (Images 16-19). Cannot                differentiate between possible carotid web versus thrombus versus                unknown etiology.  *See table(s) above for measurements and observations.  Electronically signed by Antony Contras MD on 07/02/2019 at 26:44:11 AM.   Final    VAS [place] LOWER EXTREMITY VENOUS (DVT)  Result Date: 07/02/2019  Lower Venous DVTStudy Indications: Stroke.  Risk Factors: None identified. Comparison Study: No prior studies. Performing Technologist: Oliver Hum RVT  Examination Guidelines: A complete evaluation includes B-mode imaging, spectral Doppler, color Doppler,  and power Doppler as needed of all accessible portions of each vessel. Bilateral testing is considered an integral part of a complete examination. Limited examinations for reoccurring indications may be performed as noted. The reflux portion of the exam is performed with the patient in reverse Trendelenburg.  +---------+---------------+---------+-----------+----------+--------------+ RIGHT    CompressibilityPhasicitySpontaneityPropertiesThrombus Aging +---------+---------------+---------+-----------+----------+--------------+ CFV      Full           Yes      Yes                                 +---------+---------------+---------+-----------+----------+--------------+ SFJ      Full                                                        +---------+---------------+---------+-----------+----------+--------------+ FV Prox  Full                                                        +---------+---------------+---------+-----------+----------+--------------+ FV Mid   Full                                                        +---------+---------------+---------+-----------+----------+--------------+ FV DistalFull                                                        +---------+---------------+---------+-----------+----------+--------------+ PFV      Full                                                        +---------+---------------+---------+-----------+----------+--------------+ POP      Full           Yes      Yes                                 +---------+---------------+---------+-----------+----------+--------------+ PTV  Full                                                        +---------+---------------+---------+-----------+----------+--------------+ PERO     Full                                                        +---------+---------------+---------+-----------+----------+--------------+    +---------+---------------+---------+-----------+----------+--------------+ LEFT     CompressibilityPhasicitySpontaneityPropertiesThrombus Aging +---------+---------------+---------+-----------+----------+--------------+ CFV      Full           Yes      Yes                                 +---------+---------------+---------+-----------+----------+--------------+ SFJ      Full                                                        +---------+---------------+---------+-----------+----------+--------------+ FV Prox  Full                                                        +---------+---------------+---------+-----------+----------+--------------+ FV Mid   Full                                                        +---------+---------------+---------+-----------+----------+--------------+ FV DistalFull                                                        +---------+---------------+---------+-----------+----------+--------------+ PFV      Full                                                        +---------+---------------+---------+-----------+----------+--------------+ POP      Full           Yes      Yes                                 +---------+---------------+---------+-----------+----------+--------------+ PTV      Full                                                        +---------+---------------+---------+-----------+----------+--------------+  PERO     Full                                                        +---------+---------------+---------+-----------+----------+--------------+     Summary: RIGHT: - There is no evidence of deep vein thrombosis in the lower extremity.  - No cystic structure found in the popliteal fossa.  LEFT: - There is no evidence of deep vein thrombosis in the lower extremity.  - No cystic structure found in the popliteal fossa.  *See table(s) above for measurements and observations. Electronically signed  by Monica Martinez MD on 07/02/2019 at 8:09:03 PM.    Final     Phillips Climes M.D on 07/04/2019 at 3:00 PM  Between 7am to 7pm - Pager - [phone]  After 7pm go to [email or web address] - password [facility]  [facility] -  Office  [phone]

## 2019-07-05 LAB — VITAMIN E
Vitamin E (Alpha Tocopherol): 7.6 mg/L — ABNORMAL LOW (ref 9.0–29.0)
Vitamin E(Gamma Tocopherol): 1.4 mg/L (ref 0.5–4.9)

## 2019-07-05 MED ORDER — STROKE: EARLY STAGES OF RECOVERY BOOK
Freq: Once | Status: AC
  Filled 2019-07-05: qty 1

## 2019-07-05 NOTE — NC FL2 (Signed)
[place] LEVEL OF CARE SCREENING TOOL     IDENTIFICATION  Patient Name: Russell Russell Birthdate: 26-Apr-1950 Sex: male Admission Date (Current Location): 06/30/2019  [facility] and [place] Number:  Herbalist and Address:  [facility], [place] [address] [place], [place] [address]      Provider Number: [id number]  Attending Physician Name and Address:  Albertine Patricia, MD  Relative Name and Phone Number:  [name] [phone]    Current Level of Care: Hospital Recommended Level of Care: [place] Prior Approval Number:    Date Approved/Denied: 07/05/19 PASRR Number: [id number]  Discharge Plan: SNF    Current Diagnoses: Patient Active Problem List   Diagnosis Date Noted  . Acute ischemic stroke ([place])   . Alcohol abuse   . Dyslipidemia   . Dysphagia, post-stroke   . Polycythemia   . Stroke ([place]) 06/30/2019  . Prediabetes 12/23/2016  . Elevated LFTs 12/23/2016  . Pure hypercholesterolemia 12/21/2016  . Alcoholism ([place]) 12/21/2016  . Eczema 09/18/2013  . Essential hypertension 09/18/2013  . Gastroesophageal reflux disease without esophagitis 09/18/2013  . Tobacco abuse 09/18/2013    Orientation RESPIRATION BLADDER Height & Weight     Self, Situation, Place  Normal Incontinent, External catheter Weight: 139 lb 5.3 oz (63.2 kg) Height:  5\' 10"  (177.8 cm)  BEHAVIORAL SYMPTOMS/MOOD NEUROLOGICAL BOWEL NUTRITION STATUS  Other (Comment)(no behavioral symptons or moods.)   Incontinent Diet(See Discharge Summary)  AMBULATORY STATUS COMMUNICATION OF NEEDS Skin   Extensive Assist Verbally(Difficulty speaking) Other (Comment)(Appropriate for Ethnicty, Dry;Flaky, Excoriated Scratch Marks Leg Right; Left Non-tenting)                       Personal Care Assistance Level of Assistance  Bathing, Feeding, Dressing Bathing Assistance: Maximum assistance Feeding assistance: Limited assistance(Needs assist,Needs  to be set up) Dressing Assistance: Maximum assistance     Functional Limitations Info  Sight, Hearing, Speech Sight Info: Adequate Hearing Info: Adequate Speech Info: Impaired(difficulty speaking)    SPECIAL CARE FACTORS FREQUENCY  PT (By licensed PT), OT (By licensed OT)     PT Frequency: 5x min weekly OT Frequency: 5x min weekly     Speech Therapy Frequency: 4x      Contractures Contractures Info: Not present    Additional Factors Info  Code Status Code Status Info: FULL CODE Allergies Info: Penicillins           Current Medications (07/05/2019):  This is the current hospital active medication list Current Facility-Administered Medications  Medication Dose Route Frequency Provider Last Rate Last Admin  . acetaminophen (TYLENOL) tablet 650 mg  650 mg Oral Q4H PRN Allred, Jeneen Rinks, MD       Or  . acetaminophen (TYLENOL) 160 MG/5ML solution 650 mg  650 mg Per Tube Q4H PRN Allred, Jeneen Rinks, MD       Or  . acetaminophen (TYLENOL) suppository 650 mg  650 mg Rectal Q4H PRN Allred, Jeneen Rinks, MD      . amLODipine (NORVASC) tablet 5 mg  5 mg Oral Daily Thompson Grayer, MD   5 mg at 07/05/19 0854  . aspirin suppository 300 mg  300 mg Rectal Daily Allred, James, MD       Or  . aspirin tablet 325 mg  325 mg Oral Daily Thompson Grayer, MD   325 mg at 07/05/19 0853  . atorvastatin (LIPITOR) tablet 40 mg  40 mg Oral Daily Allred, Jeneen Rinks, MD   40 mg  at 07/05/19 0855  . chlorhexidine (PERIDEX) 0.12 % solution 15 mL  15 mL Mouth Rinse BID Allred, Jeneen Rinks, MD   15 mL at 07/05/19 0855  . clopidogrel (PLAVIX) tablet 75 mg  75 mg Oral Daily Thompson Grayer, MD   75 mg at 07/05/19 0854  . enoxaparin (LOVENOX) injection 40 mg  40 mg Subcutaneous Q24H Allred, Jeneen Rinks, MD   40 mg at 07/04/19 2251  . feeding supplement (ENSURE ENLIVE) (ENSURE ENLIVE) liquid 237 mL  237 mL Oral BID BM Allred, Jeneen Rinks, MD   237 mL at 07/05/19 0857  . folic acid injection 1 mg  1 mg Intravenous Daily Allred, Jeneen Rinks, MD   1 mg at  07/04/19 1159  . labetalol (NORMODYNE) injection 10 mg  10 mg Intravenous Q2H PRN Thompson Grayer, MD   10 mg at 07/02/19 2202  . MEDLINE mouth rinse  15 mL Mouth Rinse q12n4p Allred, Jeneen Rinks, MD   15 mL at 07/04/19 1614  . multivitamin with minerals tablet 1 tablet  1 tablet Oral Daily Thompson Grayer, MD   1 tablet at 07/05/19 0853  . nicotine (NICODERM CQ - dosed in mg/24 hours) patch 21 mg  21 mg Transdermal Daily Elgergawy, Silver Huguenin, MD   21 mg at 07/05/19 0856  . pantoprazole (PROTONIX) EC tablet 40 mg  40 mg Oral Daily Allred, Jeneen Rinks, MD   40 mg at 07/05/19 0853  . senna-docusate (Senokot-S) tablet 1 tablet  1 tablet Oral QHS PRN Allred, Jeneen Rinks, MD      . thiamine tablet 100 mg  100 mg Oral Daily Allred, Jeneen Rinks, MD   100 mg at 07/05/19 [id number]   Or  . thiamine (B-1) injection 100 mg  100 mg Intravenous Daily Thompson Grayer, MD   100 mg at 07/01/19 [id number]     Discharge Medications: Please see discharge summary for a list of discharge medications.  Relevant Imaging Results:  Relevant Lab Results:   Additional Information [id number]  Trula Ore, LCSWA

## 2019-07-05 NOTE — Progress Notes (Signed)
PROGRESS NOTE                                                                                                                                                                                                             Patient Demographics:    Russell Russell, is a 69 y.o. male, DOB - 1950/03/16, [id number]  Admit date - 06/30/2019   Admitting Physician Dwyane Dee, MD  Outpatient Primary MD for the patient is Inda Coke, [place]  LOS - 5   Chief Complaint  Patient presents with  . Code Stroke       Brief Narrative    [name] is a 69 y.o. male with history of HTN, HLD, tobacco and alcohol abuse presenting with L sided weakness, MRI significant for acute CVA, he was admitted for further work-up.   Subjective:    Russell Russell today has, No headache, No chest pain, No abdominal pain , does report continued improvement in left-sided weakness.     Assessment  & Plan :    Active Problems:   Stroke [facility])   Acute ischemic stroke (HCC)   Alcohol abuse   Dyslipidemia   Dysphagia, post-stroke   Polycythemia    Acute CVA: -Neurology input greatly appreciated, evidence of multiple scattered anterior and posterior right brain infarct, likely embolic, secondary to unclear source. - CTA head & neck no ELVO. Aortic atherosclerosis. 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  Subtle R ICA bulb indistinct filling defect possible small carotid web vs less likely thrombus. - CT perfusion R frontal periventricular white matter and L cerebellar artifact/pseudonormalization. No core infarct.  - MRI  Multiple scattered posterior R basal ganglia, R cerebral, R cerebellar infarcts.  Small vessel disease. Atrophy. Multiple old lacunes B basal ganglia, thalami, pons and L cerebellar infarct  -2D echo with a preserved EF 55 to 60%, with no source of embolus -Lower extremity venous Doppler with no DVT -Admit to neurology, likely will need loop recorder to rule  out A. Fib.  No evidence of A. fib on telemetry. -Currently on aspirin and Plavix 75 mg oral daily, need to continue for 67-month, and then aspirin alone -PT/OT> CIR consulted, plan for SNF placement. -SLP consulted: Dysphagia 2  -Recorder has been inserted by EP 4/23 per neurology recommendation.  Hypertension/hypertensive urgency -Allow for permissive hypertension -Now blood pressure has started  to increase, I have stopped his IV fluids , he was started on low-dose Norvasc, blood pressure seems to be acceptable .  Hyperlipidemia -Should be on Lipitor 40 mg oral daily, but he is noncompliant, LDL is 109, goal is less than 70.  Patient will right carotid filling defect -Web versus thrombus, will continue dual antiplatelet therapy for 3 months, then repeat CTA neck and CUS for further evaluation  Alcohol abuse -On folic acid, B1 and multivitamin, on CIWA protocol, no evidence of withdrawals  Polycythemia -Likely secondary to tobacco abuse, follow-up JAK2 mutation. - Polycythemia HCT 19.2-20.1-18.0 - smoker  Tobacco abuse -He was counseled, patient requesting nicotine patch which has been provided     Code Status : Full  Family Communication  : wife at bedsdie  Disposition Plan  :  Status is: Inpatient   Dispo: The patient is from: Home              Anticipated d/c is to: [facility]               Anticipated d/c date is: 1 day              Patient currently is medically stable to d/c.  Awaiting for bed availability at [facility]  Consults  :  neurology  Procedures  : None  DVT Prophylaxis  :  Lovenox  Lab Results  Component Value Date   PLT 206 07/04/2019    Antibiotics  :    Anti-infectives (From admission, onward)   None        Objective:   Vitals:   07/05/19 0444 07/05/19 0852 07/05/19 0854 07/05/19 1224  BP: (!) 157/119  (!) 148/114 (!) 128/107  Pulse: (!) 101   100  Resp: 15   16  Temp: 97.8 F (36.6 C) 98.1 F (36.7 C)    TempSrc: Oral Oral    SpO2:  94%   94%  Weight:      Height:        Wt Readings from Last 3 Encounters:  07/03/19 63.2 kg  04/24/19 65.4 kg  03/24/19 66.9 kg    No intake or output data in the 24 hours ending 07/05/19 1514   Physical Exam  Awake Alert, Oriented X 3, No new F.N deficits, Normal affect, left-sided upper and lower extremity weakness seems to be improving Symmetrical Chest wall movement, Good air movement bilaterally, CTAB RRR,No Gallops,Rubs or new Murmurs, No Parasternal Heave +ve B.Sounds, Abd Soft, No tenderness, No rebound - guarding or rigidity. No Cyanosis, Clubbing or edema, No new Rash or bruise          Data Review:    CBC Recent Labs  Lab 06/30/19 1919 06/30/19 1926 07/01/19 0315 07/02/19 0636 07/04/19 0418  WBC 4.6  --  3.4* 4.1 4.2  HGB 19.2* 20.1* 18.0* 18.1* 18.1*  HCT 54.7* 59.0* 52.3* 52.1* 53.3*  PLT 190  --  216 190 206  MCV 91.8  --  92.2 92.7 92.7  MCH 32.2  --  31.7 32.2 31.5  MCHC 35.1  --  34.4 34.7 34.0  RDW 16.0*  --  15.8* 15.9* 16.0*  LYMPHSABS 0.7  --   --   --   --   MONOABS 0.5  --   --   --   --   EOSABS 0.0  --   --   --   --   BASOSABS 0.0  --   --   --   --  Chemistries  Recent Labs  Lab 06/30/19 1919 06/30/19 1919 06/30/19 1926 07/01/19 0315 07/02/19 0636 07/03/19 0703 07/04/19 0418  NA 136   < > 137 138 136 135 137  K 4.0   < > 3.8 3.5 3.9 3.7 3.5  CL 101   < > 101 102 102 101 100  CO2 23  --   --  19* 24 24 27   GLUCOSE 134*   < > 132* 133* 101* 92 95  BUN 7*   < > 8 6* <5* <5* 8  CREATININE 1.16   < > 0.90 0.98 0.94 1.02 1.10  CALCIUM 9.4  --   --  9.1 8.8* 8.9 9.0  MG  --   --   --  1.7  --   --   --   AST 53*  --   --   --   --   --   --   ALT 29  --   --   --   --   --   --   ALKPHOS 100  --   --   --   --   --   --   BILITOT 1.4*  --   --   --   --   --   --    < > = values in this interval not displayed.    ------------------------------------------------------------------------------------------------------------------ No results for input(s): CHOL, HDL, LDLCALC, TRIG, CHOLHDL, LDLDIRECT in the last 72 hours.  Lab Results  Component Value Date   HGBA1C 6.2 (H) 07/01/2019   ------------------------------------------------------------------------------------------------------------------ No results for input(s): TSH, T4TOTAL, T3FREE, THYROIDAB in the last 72 hours.  Invalid input(s): FREET3 ------------------------------------------------------------------------------------------------------------------ No results for input(s): VITAMINB12, FOLATE, FERRITIN, TIBC, IRON, RETICCTPCT in the last 72 hours.  Coagulation profile Recent Labs  Lab 06/30/19 1955  INR 1.0    No results for input(s): DDIMER in the last 72 hours.  Cardiac Enzymes No results for input(s): CKMB, TROPONINI, MYOGLOBIN in the last 168 hours.  Invalid input(s): CK ------------------------------------------------------------------------------------------------------------------ No results found for: BNP  Inpatient Medications  Scheduled Meds: . amLODipine  5 mg Oral Daily  . aspirin  300 mg Rectal Daily   Or  . aspirin  325 mg Oral Daily  . atorvastatin  40 mg Oral Daily  . chlorhexidine  15 mL Mouth Rinse BID  . clopidogrel  75 mg Oral Daily  . enoxaparin (LOVENOX) injection  40 mg Subcutaneous Q24H  . feeding supplement (ENSURE ENLIVE)  237 mL Oral BID BM  . folic acid  1 mg Intravenous Daily  . mouth rinse  15 mL Mouth Rinse q12n4p  . multivitamin with minerals  1 tablet Oral Daily  . nicotine  21 mg Transdermal Daily  . pantoprazole  40 mg Oral Daily  . thiamine  100 mg Oral Daily   Or  . thiamine  100 mg Intravenous Daily   Continuous Infusions:  PRN Meds:.acetaminophen **OR** acetaminophen (TYLENOL) oral liquid 160 mg/5 mL **OR** acetaminophen, labetalol, senna-docusate  Micro Results Recent  Results (from the past 240 hour(s))  SARS CORONAVIRUS 2 (TAT 6-24 HRS) Nasopharyngeal Nasopharyngeal Swab     Status: None   Collection Time: 06/30/19  9:58 PM   Specimen: Nasopharyngeal Swab  Result Value Ref Range Status   SARS Coronavirus 2 NEGATIVE NEGATIVE Final    Comment: (NOTE) SARS-CoV-2 target nucleic acids are NOT DETECTED. The SARS-CoV-2 RNA is generally detectable in upper and lower respiratory specimens during the acute phase of infection. Negative results  do not preclude SARS-CoV-2 infection, do not rule out co-infections with other pathogens, and should not be used as the sole basis for treatment or other patient management decisions. Negative results must be combined with clinical observations, patient history, and epidemiological information. The expected result is Negative. Fact Sheet for Patients: SugarRoll.be Fact Sheet for Healthcare Providers: https://www.woods-mathews.com/ This test is not yet approved or cleared by the [place] FDA and  has been authorized for detection and/or diagnosis of SARS-CoV-2 by FDA under an Emergency Use Authorization (EUA). This EUA will remain  in effect (meaning this test can be used) for the duration of the COVID-19 declaration under Section 56 4(b)(1) of the Act, 21 U.S.C. section 360bbb-3(b)(1), unless the authorization is terminated or revoked sooner. Performed at [facility], [place] [address], [place], [place] [address]     Radiology Reports CT Code Stroke CTA Head W/WO contrast  Addendum Date: 07/01/2019   ADDENDUM REPORT: 07/01/2019 09:49 ADDENDUM: Study discussed by telephone with Dr. Erlinda Hong on 07/01/2019 at 0930 hours. There is subtle heterogeneity at the lateral right ICA bulb (series 4, image 108) suggesting a small, indistinct filling defect in the vessel. Perhaps this is a small carotid web, or less likely adherent thrombus, with some flow related motion. We discussed  that I think Carotid Doppler Ultrasound focused on the Right ICA bulb would best evaluate these possibilities. Electronically Signed   By: Genevie Ann M.D.   On: 07/01/2019 09:49   Result Date: 07/01/2019 CLINICAL DATA:  Left-sided weakness EXAM: CT ANGIOGRAPHY HEAD AND NECK CT PERFUSION BRAIN TECHNIQUE: Multidetector CT imaging of the head and neck was performed using the standard protocol during bolus administration of intravenous contrast. Multiplanar CT image reconstructions and MIPs were obtained to evaluate the vascular anatomy. Carotid stenosis measurements (when applicable) are obtained utilizing NASCET criteria, using the distal internal carotid diameter as the denominator. Multiphase CT imaging of the brain was performed following IV bolus contrast injection. Subsequent parametric perfusion maps were calculated using RAPID software. CONTRAST:  132mL OMNIPAQUE IOHEXOL 350 MG/ML SOLN COMPARISON:  None. FINDINGS: CTA NECK FINDINGS SKELETON: There is no bony spinal canal stenosis. No lytic or blastic lesion. OTHER NECK: Normal pharynx, larynx and major salivary glands. No cervical lymphadenopathy. Unremarkable thyroid gland. UPPER CHEST: No pneumothorax or pleural effusion. No nodules or masses. AORTIC ARCH: There is mild calcific atherosclerosis of the aortic arch. 3.6 cm diameter of descending thoracic aorta arch. Conventional 3 vessel aortic branching pattern. The visualized proximal subclavian arteries are widely patent. RIGHT CAROTID SYSTEM: Normal without aneurysm, dissection or stenosis. LEFT CAROTID SYSTEM: Normal without aneurysm, dissection or stenosis. VERTEBRAL ARTERIES: Right dominant configuration. Both origins are clearly patent. There is no dissection, occlusion or flow-limiting stenosis to the skull base (V1-V3 segments). CTA HEAD FINDINGS POSTERIOR CIRCULATION: --Vertebral arteries: Normal V4 segments. --Posterior inferior cerebellar arteries (PICA): Patent origins from the vertebral arteries.  --Anterior inferior cerebellar arteries (AICA): Patent origins from the basilar artery. --Basilar artery: Normal. --Superior cerebellar arteries: Normal. --Posterior cerebral arteries: Normal. Both originate from the basilar artery. Posterior communicating arteries (p-comm) are diminutive or absent. ANTERIOR CIRCULATION: --Intracranial internal carotid arteries: Normal. --Anterior cerebral arteries (ACA): Normal. Both A1 segments are present. Patent anterior communicating artery (a-comm). --Middle cerebral arteries (MCA): Normal. VENOUS SINUSES: As permitted by contrast timing, patent. ANATOMIC VARIANTS: None Review of the MIP images confirms the above findings. CT Brain Perfusion Findings: ASPECTS: 10 CBF (<30%) Volume: 63mL Perfusion (Tmax>6.0s) volume: 15mL Mismatch Volume: 58mL Infarction Location:Areas of  elevated Tmax are located in the right frontal periventricular white matter and left cerebellum. These regions correspond to areas of hypoattenuation on the noncontrast head CT, including an old left cerebellar infarct. IMPRESSION: 1. No emergent large vessel occlusion or high-grade stenosis of the intracranial arteries. 2. Area of ischemia identified in the right frontal periventricular white matter and left cerebellum, corresponding to the areas of hypoattenuation on the noncontrast head CT and possibly artifact/pseudonormalization. No core infarct by CT perfusion analysis. 3. Aortic Atherosclerosis (ICD10-I70.0). 4. 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 Recommend annual imaging followup by CTA or MRA. This recommendation follows 2010 ACCF/AHA/AATS/ACR/ASA/SCA/SCAI/SIR/STS/SVM Guidelines for the Diagnosis and Management of Patients with Thoracic Aortic Disease. Circulation.2010; 121ML:[id number]. Aortic aneurysm NOS (ICD10-I71.9) Electronically Signed: By: Ulyses Jarred M.D. On: 06/30/2019 20:03   CT Code Stroke CTA Neck W/WO contrast  Addendum Date: 07/01/2019   ADDENDUM REPORT: 07/01/2019 09:49 ADDENDUM: Study  discussed by telephone with Dr. Erlinda Hong on 07/01/2019 at 0930 hours. There is subtle heterogeneity at the lateral right ICA bulb (series 4, image 108) suggesting a small, indistinct filling defect in the vessel. Perhaps this is a small carotid web, or less likely adherent thrombus, with some flow related motion. We discussed that I think Carotid Doppler Ultrasound focused on the Right ICA bulb would best evaluate these possibilities. Electronically Signed   By: Genevie Ann M.D.   On: 07/01/2019 09:49   Result Date: 07/01/2019 CLINICAL DATA:  Left-sided weakness EXAM: CT ANGIOGRAPHY HEAD AND NECK CT PERFUSION BRAIN TECHNIQUE: Multidetector CT imaging of the head and neck was performed using the standard protocol during bolus administration of intravenous contrast. Multiplanar CT image reconstructions and MIPs were obtained to evaluate the vascular anatomy. Carotid stenosis measurements (when applicable) are obtained utilizing NASCET criteria, using the distal internal carotid diameter as the denominator. Multiphase CT imaging of the brain was performed following IV bolus contrast injection. Subsequent parametric perfusion maps were calculated using RAPID software. CONTRAST:  171mL OMNIPAQUE IOHEXOL 350 MG/ML SOLN COMPARISON:  None. FINDINGS: CTA NECK FINDINGS SKELETON: There is no bony spinal canal stenosis. No lytic or blastic lesion. OTHER NECK: Normal pharynx, larynx and major salivary glands. No cervical lymphadenopathy. Unremarkable thyroid gland. UPPER CHEST: No pneumothorax or pleural effusion. No nodules or masses. AORTIC ARCH: There is mild calcific atherosclerosis of the aortic arch. 3.6 cm diameter of descending thoracic aorta arch. Conventional 3 vessel aortic branching pattern. The visualized proximal subclavian arteries are widely patent. RIGHT CAROTID SYSTEM: Normal without aneurysm, dissection or stenosis. LEFT CAROTID SYSTEM: Normal without aneurysm, dissection or stenosis. VERTEBRAL ARTERIES: Right dominant  configuration. Both origins are clearly patent. There is no dissection, occlusion or flow-limiting stenosis to the skull base (V1-V3 segments). CTA HEAD FINDINGS POSTERIOR CIRCULATION: --Vertebral arteries: Normal V4 segments. --Posterior inferior cerebellar arteries (PICA): Patent origins from the vertebral arteries. --Anterior inferior cerebellar arteries (AICA): Patent origins from the basilar artery. --Basilar artery: Normal. --Superior cerebellar arteries: Normal. --Posterior cerebral arteries: Normal. Both originate from the basilar artery. Posterior communicating arteries (p-comm) are diminutive or absent. ANTERIOR CIRCULATION: --Intracranial internal carotid arteries: Normal. --Anterior cerebral arteries (ACA): Normal. Both A1 segments are present. Patent anterior communicating artery (a-comm). --Middle cerebral arteries (MCA): Normal. VENOUS SINUSES: As permitted by contrast timing, patent. ANATOMIC VARIANTS: None Review of the MIP images confirms the above findings. CT Brain Perfusion Findings: ASPECTS: 10 CBF (<30%) Volume: 79mL Perfusion (Tmax>6.0s) volume: 59mL Mismatch Volume: 94mL Infarction Location:Areas of elevated Tmax are located in the right frontal periventricular white matter and  left cerebellum. These regions correspond to areas of hypoattenuation on the noncontrast head CT, including an old left cerebellar infarct. IMPRESSION: 1. No emergent large vessel occlusion or high-grade stenosis of the intracranial arteries. 2. Area of ischemia identified in the right frontal periventricular white matter and left cerebellum, corresponding to the areas of hypoattenuation on the noncontrast head CT and possibly artifact/pseudonormalization. No core infarct by CT perfusion analysis. 3. Aortic Atherosclerosis (ICD10-I70.0). 4. 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 Recommend annual imaging followup by CTA or MRA. This recommendation follows 2010 ACCF/AHA/AATS/ACR/ASA/SCA/SCAI/SIR/STS/SVM Guidelines for the  Diagnosis and Management of Patients with Thoracic Aortic Disease. Circulation.2010; [id number]. Aortic aneurysm NOS (ICD10-I71.9) Electronically Signed: By: Ulyses Jarred M.D. On: 06/30/2019 20:03   MR BRAIN WO CONTRAST  Result Date: 07/01/2019 CLINICAL DATA:  Follow-up examination for acute stroke. EXAM: MRI HEAD WITHOUT CONTRAST TECHNIQUE: Multiplanar, multiecho pulse sequences of the brain and surrounding structures were obtained without intravenous contrast. COMPARISON:  Prior CTs from 06/30/2019. FINDINGS: Brain: Examination degraded by motion artifact. Generalized age-related cerebral atrophy. Extensive patchy and confluent T2/FLAIR hyperintensity within the periventricular deep white matter both cerebral hemispheres as well as the pons, most consistent with chronic small vessel ischemic disease, fairly advanced in nature. Multiple superimposed remote lacunar infarcts seen about the bilateral basal ganglia, thalami, and pons. Chronic left cerebellar infarct noted as well. Approximate 2.5 cm linear focus of restricted diffusion seen extending from the posterior right lentiform nucleus into the posterior right caudate and corona radiata, consistent with an acute ischemic perforator type infarct (series 5, image 73). Few additional scattered ischemic infarcts seen involving the subcortical white matter of the overlying right frontal lobe (w series 5, image 83, 81). Additional subcentimeter acute ischemic infarct noted within the subcortical right occipital lobe (series 5, image 63). Approximate 1 cm acute ischemic infarcts seen involving the inferior right cerebellum (series 5, image 53). No definite associated hemorrhage about these infarcts. No associated mass effect. No mass lesion or midline shift. Mild ventricular prominence related to global parenchymal volume loss without hydrocephalus. No extra-axial fluid collection. Pituitary gland suprasellar region within normal limits. Midline structures  intact. Vascular: Major intracranial vascular flow voids are maintained. Skull and upper cervical spine: Craniocervical junction within normal limits. Bone marrow signal intensity normal. No scalp soft tissue abnormality. Sinuses/Orbits: Globes and orbital soft tissues within normal limits. Mild scattered mucosal thickening noted throughout the paranasal sinuses. No mastoid effusion. Inner ear structures normal. Other: None. IMPRESSION: 1. Multiple scattered acute ischemic infarcts involving the posterior right basal ganglia, right cerebral hemisphere, and right cerebellum as above. No associated hemorrhage or mass effect. 2. Underlying atrophy with advanced chronic microvascular ischemic disease, with multiple remote lacunar infarcts about the bilateral basal ganglia, thalami, and pons, with additional chronic left cerebellar infarct. Electronically Signed   By: Jeannine Boga M.D.   On: 07/01/2019 01:29   EP PPM/ICD IMPLANT  Result Date: 07/03/2019 SURGEON:  Thompson Grayer, MD   PREPROCEDURE DIAGNOSIS:  Cryptogenic Stroke   POSTPROCEDURE DIAGNOSIS:  Cryptogenic Stroke    PROCEDURES:  1. Implantable loop recorder implantation   INTRODUCTION:  Russell Russell is a 69 y.o. male with a history of unexplained stroke who presents today for implantable loop implantation.  The patient has had a cryptogenic stroke.  Despite an extensive workup by neurology, no reversible causes have been identified.  he has worn telemetry during which he did not have arrhythmias.  There is significant concern for possible atrial fibrillation as the cause for the patients  stroke.  The patient therefore presents today for implantable loop implantation.   DESCRIPTION OF PROCEDURE:  Informed written consent was obtained.  The patient required no sedation for the procedure today.  The patients left chest was prepped and draped. Mapping over the patient's chest was performed to identify the appropriate ILR site.  This area was found to be  the left parasternal region over the 3rd-4th intercostal space.  The skin overlying this region was infiltrated with lidocaine for local analgesia.  A 0.5-cm incision was made at the implant site.  A subcutaneous ILR pocket was fashioned using a combination of sharp and blunt dissection.  A Medtronic Reveal Linq model [id number] implantable loop recorder was then placed into the pocket R waves were very prominent and measured > 0.2 mV. EBL<1 ml.  Steri- Strips and a sterile dressing were then applied.  There were no early apparent complications.   CONCLUSIONS:  1. Successful implantation of a Medtronic Reveal LINQ implantable loop recorder for cryptogenic stroke  2. No early apparent complications. Thompson Grayer MD, [facility] 07/03/2019 12:57 PM   CT Code Stroke Cerebral Perfusion with contrast  Addendum Date: 07/01/2019   ADDENDUM REPORT: 07/01/2019 09:49 ADDENDUM: Study discussed by telephone with Dr. Erlinda Hong on 07/01/2019 at 0930 hours. There is subtle heterogeneity at the lateral right ICA bulb (series 4, image 108) suggesting a small, indistinct filling defect in the vessel. Perhaps this is a small carotid web, or less likely adherent thrombus, with some flow related motion. We discussed that I think Carotid Doppler Ultrasound focused on the Right ICA bulb would best evaluate these possibilities. Electronically Signed   By: Genevie Ann M.D.   On: 07/01/2019 09:49   Result Date: 07/01/2019 CLINICAL DATA:  Left-sided weakness EXAM: CT ANGIOGRAPHY HEAD AND NECK CT PERFUSION BRAIN TECHNIQUE: Multidetector CT imaging of the head and neck was performed using the standard protocol during bolus administration of intravenous contrast. Multiplanar CT image reconstructions and MIPs were obtained to evaluate the vascular anatomy. Carotid stenosis measurements (when applicable) are obtained utilizing NASCET criteria, using the distal internal carotid diameter as the denominator. Multiphase CT imaging of the brain was performed following IV  bolus contrast injection. Subsequent parametric perfusion maps were calculated using RAPID software. CONTRAST:  14mL OMNIPAQUE IOHEXOL 350 MG/ML SOLN COMPARISON:  None. FINDINGS: CTA NECK FINDINGS SKELETON: There is no bony spinal canal stenosis. No lytic or blastic lesion. OTHER NECK: Normal pharynx, larynx and major salivary glands. No cervical lymphadenopathy. Unremarkable thyroid gland. UPPER CHEST: No pneumothorax or pleural effusion. No nodules or masses. AORTIC ARCH: There is mild calcific atherosclerosis of the aortic arch. 3.6 cm diameter of descending thoracic aorta arch. Conventional 3 vessel aortic branching pattern. The visualized proximal subclavian arteries are widely patent. RIGHT CAROTID SYSTEM: Normal without aneurysm, dissection or stenosis. LEFT CAROTID SYSTEM: Normal without aneurysm, dissection or stenosis. VERTEBRAL ARTERIES: Right dominant configuration. Both origins are clearly patent. There is no dissection, occlusion or flow-limiting stenosis to the skull base (V1-V3 segments). CTA HEAD FINDINGS POSTERIOR CIRCULATION: --Vertebral arteries: Normal V4 segments. --Posterior inferior cerebellar arteries (PICA): Patent origins from the vertebral arteries. --Anterior inferior cerebellar arteries (AICA): Patent origins from the basilar artery. --Basilar artery: Normal. --Superior cerebellar arteries: Normal. --Posterior cerebral arteries: Normal. Both originate from the basilar artery. Posterior communicating arteries (p-comm) are diminutive or absent. ANTERIOR CIRCULATION: --Intracranial internal carotid arteries: Normal. --Anterior cerebral arteries (ACA): Normal. Both A1 segments are present. Patent anterior communicating artery (a-comm). --Middle cerebral arteries (MCA): Normal. VENOUS SINUSES:  As permitted by contrast timing, patent. ANATOMIC VARIANTS: None Review of the MIP images confirms the above findings. CT Brain Perfusion Findings: ASPECTS: 10 CBF (<30%) Volume: 53mL Perfusion  (Tmax>6.0s) volume: 73mL Mismatch Volume: 73mL Infarction Location:Areas of elevated Tmax are located in the right frontal periventricular white matter and left cerebellum. These regions correspond to areas of hypoattenuation on the noncontrast head CT, including an old left cerebellar infarct. IMPRESSION: 1. No emergent large vessel occlusion or high-grade stenosis of the intracranial arteries. 2. Area of ischemia identified in the right frontal periventricular white matter and left cerebellum, corresponding to the areas of hypoattenuation on the noncontrast head CT and possibly artifact/pseudonormalization. No core infarct by CT perfusion analysis. 3. Aortic Atherosclerosis (ICD10-I70.0). 4. 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 Recommend annual imaging followup by CTA or MRA. This recommendation follows 2010 ACCF/AHA/AATS/ACR/ASA/SCA/SCAI/SIR/STS/SVM Guidelines for the Diagnosis and Management of Patients with Thoracic Aortic Disease. Circulation.2010; [id number]. Aortic aneurysm NOS (ICD10-I71.9) Electronically Signed: By: Ulyses Jarred M.D. On: 06/30/2019 20:03   ECHOCARDIOGRAM COMPLETE BUBBLE STUDY  Result Date: 07/01/2019    ECHOCARDIOGRAM REPORT   Patient Name:   Russell Russell Date of Exam: 07/01/2019 Medical Rec #:  [id number]      Height:       70.0 in Accession #:    [id number]     Weight:       151.0 lb Date of Birth:  02/14/1951     BSA:          1.852 m Patient Age:    41 years       BP:           161/130 mmHg Patient Gender: M              HR:           83 bpm. Exam Location:  Inpatient Procedure: 2D Echo, Color Doppler, Cardiac Doppler and Saline Contrast Bubble            Study Indications:    Stroke i163.9  History:        Patient has no prior history of Echocardiogram examinations.                 Risk Factors:Hypertension and Dyslipidemia.  Sonographer:    Raquel Sarna Senior RDCS Referring Phys: [place]  Sonographer Comments: Technically difficult study due to poor echo windows, suboptimal  parasternal window and suboptimal apical window. Technically difficult due to lung interference and respiratory variation. Patient coughing and moving thoughout exam. No parasternal, apical, or suprasternal window. IMPRESSIONS  1. Very poor study. All views basically obtained from the subcostal views.  2. Left ventricular ejection fraction, by estimation, is 55 to 60%. The left ventricle has normal function. The left ventricle has no regional wall motion abnormalities. Left ventricular diastolic function could not be evaluated.  3. Right ventricular systolic function is normal. The right ventricular size is normal.  4. The mitral valve is grossly normal. No evidence of mitral valve regurgitation. No evidence of mitral stenosis.  5. The aortic valve is grossly normal. Aortic valve regurgitation is not visualized. No aortic stenosis is present.  6. The inferior vena cava is normal in size with greater than 50% respiratory variability, suggesting right atrial pressure of 3 mmHg.  7. Agitated saline contrast bubble study was negative, with no evidence of any interatrial shunt. Conclusion(s)/Recommendation(s): No intracardiac source of embolism detected on this transthoracic study. A transesophageal echocardiogram is recommended to exclude cardiac source of embolism if  clinically indicated. FINDINGS  Left Ventricle: Left ventricular ejection fraction, by estimation, is 55 to 60%. The left ventricle has normal function. The left ventricle has no regional wall motion abnormalities. The left ventricular internal cavity size was normal in size. There is  no left ventricular hypertrophy. Left ventricular diastolic function could not be evaluated due to nondiagnostic images. Left ventricular diastolic function could not be evaluated. Right Ventricle: The right ventricular size is normal. No increase in right ventricular wall thickness. Right ventricular systolic function is normal. Left Atrium: Left atrial size was normal in  size. Right Atrium: Right atrial size was normal in size. Pericardium: Trivial pericardial effusion is present. Mitral Valve: The mitral valve is grossly normal. No evidence of mitral valve regurgitation. No evidence of mitral valve stenosis. Tricuspid Valve: The tricuspid valve is grossly normal. Tricuspid valve regurgitation is trivial. No evidence of tricuspid stenosis. Aortic Valve: The aortic valve is grossly normal. Aortic valve regurgitation is not visualized. No aortic stenosis is present. Pulmonic Valve: The pulmonic valve was grossly normal. Pulmonic valve regurgitation is not visualized. No evidence of pulmonic stenosis. Aorta: The aortic root is normal in size and structure. Venous: The inferior vena cava is normal in size with greater than 50% respiratory variability, suggesting right atrial pressure of 3 mmHg. IAS/Shunts: No atrial level shunt detected by color flow Doppler. Agitated saline contrast was given intravenously to evaluate for intracardiac shunting. Agitated saline contrast bubble study was negative, with no evidence of any interatrial shunt.  LEFT VENTRICLE PLAX 2D LVIDd:         3.80 cm  Diastology LVIDs:         2.50 cm  LV e' lateral: 6.20 cm/s LV PW:         1.10 cm  LV e' medial:  4.57 cm/s LV IVS:        1.00 cm LVOT diam:     2.10 cm LV SV:         48 LV SV Index:   26 LVOT Area:     3.46 cm  RIGHT VENTRICLE RV S prime:     9.46 cm/s TAPSE (M-mode): 1.8 cm LEFT ATRIUM           Index       RIGHT ATRIUM           Index LA diam:      3.30 cm 1.78 cm/m  RA Area:     19.00 cm LA Vol (A4C): 50.4 ml 27.18 ml/m RA Volume:   57.90 ml  31.26 ml/m  AORTIC VALVE LVOT Vmax:   64.00 cm/s LVOT Vmean:  50.200 cm/s LVOT VTI:    0.140 m  AORTA Ao Root diam: 3.60 cm  SHUNTS Systemic VTI:  0.14 m Systemic Diam: 2.10 cm Eleonore Chiquito MD Electronically signed by Eleonore Chiquito MD Signature Date/Time: 07/01/2019/3:25:46 PM    Final    CT HEAD CODE STROKE WO CONTRAST  Result Date:  06/30/2019 CLINICAL DATA:  Code stroke.  Left-sided weakness EXAM: CT HEAD WITHOUT CONTRAST TECHNIQUE: Contiguous axial images were obtained from the base of the skull through the vertex without intravenous contrast. COMPARISON:  None. FINDINGS: Brain: There is no mass, hemorrhage or extra-axial collection. There is generalized atrophy without lobar predilection. There is hypoattenuation of the periventricular white matter, most commonly indicating chronic ischemic microangiopathy. Old left basal ganglia small vessel infarct. Vascular: No abnormal hyperdensity of the major intracranial arteries or dural venous sinuses. No intracranial atherosclerosis. Skull: The visualized  skull base, calvarium and extracranial soft tissues are normal. Sinuses/Orbits: No fluid levels or advanced mucosal thickening of the visualized paranasal sinuses. No mastoid or middle ear effusion. The orbits are normal. ASPECTS [facility] Stroke Program Early CT Score) - Ganglionic level infarction (caudate, lentiform nuclei, internal capsule, insula, M1-M3 cortex): 7 - Supraganglionic infarction (M4-M6 cortex): 3 Total score (0-10 with 10 being normal): 10 IMPRESSION: 1. Chronic ischemic microangiopathy and generalized atrophy without acute intracranial abnormality. 2. ASPECTS is 10. * These results were communicated to Dr. Kerney Elbe at 7:30 pm on 06/30/2019 by text page via the [facility] messaging system. Electronically Signed   By: Ulyses Jarred M.D.   On: 06/30/2019 19:30   VAS US CAROTID  Result Date: 07/02/2019 Carotid Arterial Duplex Study Indications:       CVA and follow up possible carotid web versus thrombus seen                    on CT. Comparison Study:  No prior study Performing Technologist: Maudry Mayhew MHA, RDMS, RVT, RDCS  Examination Guidelines: A complete evaluation includes B-mode imaging, spectral Doppler, color Doppler, and power Doppler as needed of all accessible portions of each vessel. Bilateral testing is  considered an integral part of a complete examination. Limited examinations for reoccurring indications may be performed as noted.  Right Carotid Findings: +----------+--------+--------+--------+------------------+--------+           PSV cm/sEDV cm/sStenosisPlaque DescriptionComments +----------+--------+--------+--------+------------------+--------+ CCA Prox  51      14                                         +----------+--------+--------+--------+------------------+--------+ CCA Distal44      16                                         +----------+--------+--------+--------+------------------+--------+ ICA Prox  17      4                                          +----------+--------+--------+--------+------------------+--------+ ICA Distal59      26                                         +----------+--------+--------+--------+------------------+--------+ ECA       44      14                                         +----------+--------+--------+--------+------------------+--------+   Summary: Right Carotid: Velocities in the right ICA are consistent with a 1-39% stenosis.                There is a heterogenous mobile area of the anterior vessel wall                at the carotid bulb/proximal ICA (Images 16-19). Cannot                differentiate between possible carotid web versus thrombus versus  unknown etiology.  *See table(s) above for measurements and observations.  Electronically signed by Antony Contras MD on 07/02/2019 at 6:44:11 AM.   Final    VAS [place] LOWER EXTREMITY VENOUS (DVT)  Result Date: 07/02/2019  Lower Venous DVTStudy Indications: Stroke.  Risk Factors: None identified. Comparison Study: No prior studies. Performing Technologist: Oliver Hum RVT  Examination Guidelines: A complete evaluation includes B-mode imaging, spectral Doppler, color Doppler, and power Doppler as needed of all accessible portions of each vessel. Bilateral testing  is considered an integral part of a complete examination. Limited examinations for reoccurring indications may be performed as noted. The reflux portion of the exam is performed with the patient in reverse Trendelenburg.  +---------+---------------+---------+-----------+----------+--------------+ RIGHT    CompressibilityPhasicitySpontaneityPropertiesThrombus Aging +---------+---------------+---------+-----------+----------+--------------+ CFV      Full           Yes      Yes                                 +---------+---------------+---------+-----------+----------+--------------+ SFJ      Full                                                        +---------+---------------+---------+-----------+----------+--------------+ FV Prox  Full                                                        +---------+---------------+---------+-----------+----------+--------------+ FV Mid   Full                                                        +---------+---------------+---------+-----------+----------+--------------+ FV DistalFull                                                        +---------+---------------+---------+-----------+----------+--------------+ PFV      Full                                                        +---------+---------------+---------+-----------+----------+--------------+ POP      Full           Yes      Yes                                 +---------+---------------+---------+-----------+----------+--------------+ PTV      Full                                                        +---------+---------------+---------+-----------+----------+--------------+  PERO     Full                                                        +---------+---------------+---------+-----------+----------+--------------+   +---------+---------------+---------+-----------+----------+--------------+ LEFT      CompressibilityPhasicitySpontaneityPropertiesThrombus Aging +---------+---------------+---------+-----------+----------+--------------+ CFV      Full           Yes      Yes                                 +---------+---------------+---------+-----------+----------+--------------+ SFJ      Full                                                        +---------+---------------+---------+-----------+----------+--------------+ FV Prox  Full                                                        +---------+---------------+---------+-----------+----------+--------------+ FV Mid   Full                                                        +---------+---------------+---------+-----------+----------+--------------+ FV DistalFull                                                        +---------+---------------+---------+-----------+----------+--------------+ PFV      Full                                                        +---------+---------------+---------+-----------+----------+--------------+ POP      Full           Yes      Yes                                 +---------+---------------+---------+-----------+----------+--------------+ PTV      Full                                                        +---------+---------------+---------+-----------+----------+--------------+ PERO     Full                                                        +---------+---------------+---------+-----------+----------+--------------+       Summary: RIGHT: - There is no evidence of deep vein thrombosis in the lower extremity.  - No cystic structure found in the popliteal fossa.  LEFT: - There is no evidence of deep vein thrombosis in the lower extremity.  - No cystic structure found in the popliteal fossa.  *See table(s) above for measurements and observations. Electronically signed by Monica Martinez MD on 07/02/2019 at 8:09:03 PM.    Final     Phillips Climes  M.D on 07/05/2019 at 3:14 PM  Between 7am to 7pm - Pager - [phone]  After 7pm go to [email or web address] - password [facility]  [facility] -  Office  [phone]

## 2019-07-05 NOTE — TOC Initial Note (Signed)
Transition of Care [facility]) - Initial/Assessment Note    Patient Details  Name: Russell Russell MRN: [id number] Date of Birth: 09/03/1950  Transition of Care [facility]) CM/SW Contact:    Gabrielle Dare Phone Number: 07/05/2019, 10:17 AM  Clinical Narrative:                 CSW spoke with pt's spouse by phone.  CSW introduced role and recommendations from pt.  Pt's spouse was given the web address for medicare.gov website for possibly SNF placements.  Pt's spouse is hopefully for CIR for pt and SNF will be a back up for pt.  Pt's spouse gave verbal permission for CSW to send out to SNF's in the area.  Pt and spouse lives in a single story home.  Pt's spouse stated" she spoke with pt between 11:30-12:00pm and pt may have had stroke between 12:00pm-5:30pm according to pt's spouse.  TOC team will continue to follow for disposition planning.  Expected Discharge Plan: Skilled Nursing Facility Barriers to Discharge: Continued Medical Work up   Patient Goals and CMS Choice Patient states their goals for this hospitalization and ongoing recovery are:: to go to skilled nursing facility CMS Medicare.gov Compare Post Acute Care list provided to:: Patient Represenative (must comment)(Spouse [name]) Choice offered to / list presented to : Spouse([name])  Expected Discharge Plan and Services Expected Discharge Plan: [place] In-house Referral: Clinical Social Work Discharge Planning Services: CM Consult Post Acute Care Choice: IP Rehab Living arrangements for the past 2 months: Single Family Home                                      Prior Living Arrangements/Services Living arrangements for the past 2 months: Single Family Home Lives with:: Spouse Patient language and need for interpreter reviewed:: Yes Do you feel safe going back to the place where you live?: No   to go to SNFs  Need for Family Participation in Patient Care: Yes (Comment) Care giver support system in  place?: Yes (comment)   Criminal Activity/Legal Involvement Pertinent to Current Situation/Hospitalization: No - Comment as needed  Activities of Daily Living Home Assistive Devices/Equipment: None ADL Screening (condition at time of admission) Patient's cognitive ability adequate to safely complete daily activities?: Yes Is the patient deaf or have difficulty hearing?: No Does the patient have difficulty seeing, even when wearing glasses/contacts?: Yes Does the patient have difficulty concentrating, remembering, or making decisions?: No Patient able to express need for assistance with ADLs?: Yes Does the patient have difficulty dressing or bathing?: No Independently performs ADLs?: Yes (appropriate for developmental age) Does the patient have difficulty walking or climbing stairs?: No Weakness of Legs: None Weakness of Arms/Hands: None  Permission Sought/Granted Permission sought to share information with : Case Manager, Family Supports, Customer service manager Permission granted to share information with : Yes, Verbal Permission Granted  Share Information with NAME: [name]  Permission granted to share info w AGENCY: SNFs  Permission granted to share info w Relationship: Spouse  Permission granted to share info w Contact Information: [name] [phone]  Emotional Assessment Appearance:: Appears stated age Attitude/Demeanor/Rapport: Gracious, Engaged Affect (typically observed): Accepting, Adaptable, Stable Orientation: : Oriented to Self, Oriented to Place, Oriented to Situation Alcohol / Substance Use: Not Applicable Psych Involvement: No (comment)  Admission diagnosis:  Stroke [facility]) [I63.9] Acute ischemic stroke [facility]) [I63.9] Patient Active Problem List   Diagnosis Date  Noted  . Acute ischemic stroke ([place])   . Alcohol abuse   . Dyslipidemia   . Dysphagia, post-stroke   . Polycythemia   . Stroke ([place]) 06/30/2019  . Prediabetes 12/23/2016  . Elevated LFTs  12/23/2016  . Pure hypercholesterolemia 12/21/2016  . Alcoholism ([place]) 12/21/2016  . Eczema 09/18/2013  . Essential hypertension 09/18/2013  . Gastroesophageal reflux disease without esophagitis 09/18/2013  . Tobacco abuse 09/18/2013   PCP:  Inda Coke, PA Pharmacy:   [id number] - [place], [place] - [address] [place] [address] Phone: [phone] Fax: [phone]     Social Determinants of Health (SDOH) Interventions    Readmission Risk Interventions No flowsheet data found.

## 2019-07-06 MED ORDER — METOPROLOL TARTRATE 12.5 MG HALF TABLET
12.5000 mg | ORAL_TABLET | Freq: Two times a day (BID) | ORAL | Status: DC
  Administered 2019-07-07: 12.5 mg via ORAL
  Filled 2019-07-06: qty 1

## 2019-07-06 NOTE — Progress Notes (Signed)
PROGRESS NOTE                                                                                                                                                                                                             Patient Demographics:    Russell Russell, is a 69 y.o. male, DOB - 03-14-1950, [id number]  Admit date - 06/30/2019   Admitting Physician Dwyane Dee, MD  Outpatient Primary MD for the patient is Inda Coke, [place]  LOS - 6   Chief Complaint  Patient presents with  . Code Stroke       Brief Narrative    Russell Russell is a 70 y.o. male with history of HTN, HLD, tobacco and alcohol abuse presenting with L sided weakness, MRI significant for acute CVA, he was admitted for further work-up.   Subjective:    Russell Russell today has, No headache, No chest pain, No abdominal pain , does report continued improvement in left-sided weakness.     Assessment  & Plan :    Active Problems:   Stroke [facility])   Acute ischemic stroke (HCC)   Alcohol abuse   Dyslipidemia   Dysphagia, post-stroke   Polycythemia    Acute CVA: -Neurology input greatly appreciated, evidence of multiple scattered anterior and posterior right brain infarct, likely embolic, secondary to unclear source. - CTA head & neck no ELVO. Aortic atherosclerosis. 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  Subtle R ICA bulb indistinct filling defect possible small carotid web vs less likely thrombus. - CT perfusion R frontal periventricular white matter and L cerebellar artifact/pseudonormalization. No core infarct.  - MRI  Multiple scattered posterior R basal ganglia, R cerebral, R cerebellar infarcts.  Small vessel disease. Atrophy. Multiple old lacunes B basal ganglia, thalami, pons and L cerebellar infarct  -2D echo with a preserved EF 55 to 60%, with no source of embolus -Lower extremity venous Doppler with no DVT -Admit to neurology, likely will need loop recorder to rule  out A. Fib.  No evidence of A. fib on telemetry. -Currently on aspirin and Plavix 75 mg oral daily, need to continue for 75-month, and then aspirin alone -PT/OT> CIR consulted, plan for SNF placement. -SLP consulted: Dysphagia 2  -Recorder has been inserted by EP 4/23 per neurology recommendation.  Hypertension/hypertensive urgency -Allow for permissive hypertension -Now blood pressure has started  to increase, I have stopped his IV fluids , he was started on Norvasc initially with good blood pressure control, but given some tachycardia, I will DC Norvasc and start metoprolol from tomorrow.   Hyperlipidemia -Should be on Lipitor 40 mg oral daily, but he is noncompliant, LDL is 109, goal is less than 70.  Patient will right carotid filling defect -Web versus thrombus, will continue dual antiplatelet therapy for 3 months, then repeat CTA neck and CUS for further evaluation  Alcohol abuse -On folic acid, B1 and multivitamin, on CIWA protocol, no evidence of withdrawals  Polycythemia -Likely secondary to tobacco abuse, follow-up JAK2 mutation. - Polycythemia HCT 19.2-20.1-18.0 - smoker  Tobacco abuse -He was counseled, patient requesting nicotine patch which has been provided     Code Status : Full  Family Communication  : wife at bedsdie  Disposition Plan  :  Status is: Inpatient   Dispo: The patient is from: Home              Anticipated d/c is to: CIR               Anticipated d/c date is: 1 day              Patient currently is medically stable to d/c.  Awaiting for bed availability at [facility]  Consults  :  neurology  Procedures  : None  DVT Prophylaxis  :  Lovenox  Lab Results  Component Value Date   PLT 206 07/04/2019    Antibiotics  :    Anti-infectives (From admission, onward)   None        Objective:   Vitals:   07/06/19 1251 07/06/19 1300 07/06/19 1400 07/06/19 1421  BP:    112/88  Pulse:  (!) 114 (!) 106 (!) 105  Resp: 19  20 19   Temp:        TempSrc:      SpO2:   94%   Weight:      Height:        Wt Readings from Last 3 Encounters:  07/03/19 63.2 kg  04/24/19 65.4 kg  03/24/19 66.9 kg     Intake/Output Summary (Last 24 hours) at 07/06/2019 1627 Last data filed at 07/06/2019 0945 Gross per 24 hour  Intake 240 ml  Output 1150 ml  Net -910 ml     Physical Exam  Awake Alert, Oriented X 3, No new F.N deficits, Normal affect, left-sided upper and lower extremity weakness seems to be improving Clear to auscultation bilaterally Regular rate and rhythm, no rubs or gallops Abd soft, nontender No Cyanosis, Clubbing or edema, No new Rash or bruise          Data Review:    CBC Recent Labs  Lab 06/30/19 1919 06/30/19 1926 07/01/19 0315 07/02/19 0636 07/04/19 0418  WBC 4.6  --  3.4* 4.1 4.2  HGB 19.2* 20.1* 18.0* 18.1* 18.1*  HCT 54.7* 59.0* 52.3* 52.1* 53.3*  PLT 190  --  216 190 206  MCV 91.8  --  92.2 92.7 92.7  MCH 32.2  --  31.7 32.2 31.5  MCHC 35.1  --  34.4 34.7 34.0  RDW 16.0*  --  15.8* 15.9* 16.0*  LYMPHSABS 0.7  --   --   --   --   MONOABS 0.5  --   --   --   --   EOSABS 0.0  --   --   --   --   BASOSABS 0.0  --   --   --   --  Chemistries  Recent Labs  Lab 06/30/19 1919 06/30/19 1919 06/30/19 1926 07/01/19 0315 07/02/19 0636 07/03/19 0703 07/04/19 0418  NA 136   < > 137 138 136 135 137  K 4.0   < > 3.8 3.5 3.9 3.7 3.5  CL 101   < > 101 102 102 101 100  CO2 23  --   --  19* 24 24 27   GLUCOSE 134*   < > 132* 133* 101* 92 95  BUN 7*   < > 8 6* <5* <5* 8  CREATININE 1.16   < > 0.90 0.98 0.94 1.02 1.10  CALCIUM 9.4  --   --  9.1 8.8* 8.9 9.0  MG  --   --   --  1.7  --   --   --   AST 53*  --   --   --   --   --   --   ALT 29  --   --   --   --   --   --   ALKPHOS 100  --   --   --   --   --   --   BILITOT 1.4*  --   --   --   --   --   --    < > = values in this interval not displayed.    ------------------------------------------------------------------------------------------------------------------ No results for input(s): CHOL, HDL, LDLCALC, TRIG, CHOLHDL, LDLDIRECT in the last 72 hours.  Lab Results  Component Value Date   HGBA1C 6.2 (H) 07/01/2019   ------------------------------------------------------------------------------------------------------------------ No results for input(s): TSH, T4TOTAL, T3FREE, THYROIDAB in the last 72 hours.  Invalid input(s): FREET3 ------------------------------------------------------------------------------------------------------------------ No results for input(s): VITAMINB12, FOLATE, FERRITIN, TIBC, IRON, RETICCTPCT in the last 72 hours.  Coagulation profile Recent Labs  Lab 06/30/19 1955  INR 1.0    No results for input(s): DDIMER in the last 72 hours.  Cardiac Enzymes No results for input(s): CKMB, TROPONINI, MYOGLOBIN in the last 168 hours.  Invalid input(s): CK ------------------------------------------------------------------------------------------------------------------ No results found for: BNP  Inpatient Medications  Scheduled Meds: . amLODipine  5 mg Oral Daily  . aspirin  300 mg Rectal Daily   Or  . aspirin  325 mg Oral Daily  . atorvastatin  40 mg Oral Daily  . chlorhexidine  15 mL Mouth Rinse BID  . clopidogrel  75 mg Oral Daily  . enoxaparin (LOVENOX) injection  40 mg Subcutaneous Q24H  . feeding supplement (ENSURE ENLIVE)  237 mL Oral BID BM  . folic acid  1 mg Intravenous Daily  . mouth rinse  15 mL Mouth Rinse q12n4p  . multivitamin with minerals  1 tablet Oral Daily  . nicotine  21 mg Transdermal Daily  . pantoprazole  40 mg Oral Daily  . thiamine  100 mg Oral Daily   Or  . thiamine  100 mg Intravenous Daily   Continuous Infusions:  PRN Meds:.acetaminophen **OR** acetaminophen (TYLENOL) oral liquid 160 mg/5 mL **OR** acetaminophen, labetalol, senna-docusate  Micro Results Recent  Results (from the past 240 hour(s))  SARS CORONAVIRUS 2 (TAT 6-24 HRS) Nasopharyngeal Nasopharyngeal Swab     Status: None   Collection Time: 06/30/19  9:58 PM   Specimen: Nasopharyngeal Swab  Result Value Ref Range Status   SARS Coronavirus 2 NEGATIVE NEGATIVE Final    Comment: (NOTE) SARS-CoV-2 target nucleic acids are NOT DETECTED. The SARS-CoV-2 RNA is generally detectable in upper and lower respiratory specimens during the acute phase of infection. Negative results  do not preclude SARS-CoV-2 infection, do not rule out co-infections with other pathogens, and should not be used as the sole basis for treatment or other patient management decisions. Negative results must be combined with clinical observations, patient history, and epidemiological information. The expected result is Negative. Fact Sheet for Patients: SugarRoll.be Fact Sheet for Healthcare Providers: https://www.woods-mathews.com/ This test is not yet approved or cleared by the [place] FDA and  has been authorized for detection and/or diagnosis of SARS-CoV-2 by FDA under an Emergency Use Authorization (EUA). This EUA will remain  in effect (meaning this test can be used) for the duration of the COVID-19 declaration under Section 56 4(b)(1) of the Act, 21 U.S.C. section 360bbb-3(b)(1), unless the authorization is terminated or revoked sooner. Performed at [facility], [address], [place], [place] [address]     Radiology Reports CT Code Stroke CTA Head W/WO contrast  Addendum Date: 07/01/2019   ADDENDUM REPORT: 07/01/2019 09:49 ADDENDUM: Study discussed by telephone with Dr. Erlinda Hong on 07/01/2019 at 0930 hours. There is subtle heterogeneity at the lateral right ICA bulb (series 4, image 108) suggesting a small, indistinct filling defect in the vessel. Perhaps this is a small carotid web, or less likely adherent thrombus, with some flow related motion. We discussed  that I think Carotid Doppler Ultrasound focused on the Right ICA bulb would best evaluate these possibilities. Electronically Signed   By: Genevie Ann M.D.   On: 07/01/2019 09:49   Result Date: 07/01/2019 CLINICAL DATA:  Left-sided weakness EXAM: CT ANGIOGRAPHY HEAD AND NECK CT PERFUSION BRAIN TECHNIQUE: Multidetector CT imaging of the head and neck was performed using the standard protocol during bolus administration of intravenous contrast. Multiplanar CT image reconstructions and MIPs were obtained to evaluate the vascular anatomy. Carotid stenosis measurements (when applicable) are obtained utilizing NASCET criteria, using the distal internal carotid diameter as the denominator. Multiphase CT imaging of the brain was performed following IV bolus contrast injection. Subsequent parametric perfusion maps were calculated using RAPID software. CONTRAST:  172mL OMNIPAQUE IOHEXOL 350 MG/ML SOLN COMPARISON:  None. FINDINGS: CTA NECK FINDINGS SKELETON: There is no bony spinal canal stenosis. No lytic or blastic lesion. OTHER NECK: Normal pharynx, larynx and major salivary glands. No cervical lymphadenopathy. Unremarkable thyroid gland. UPPER CHEST: No pneumothorax or pleural effusion. No nodules or masses. AORTIC ARCH: There is mild calcific atherosclerosis of the aortic arch. 3.6 cm diameter of descending thoracic aorta arch. Conventional 3 vessel aortic branching pattern. The visualized proximal subclavian arteries are widely patent. RIGHT CAROTID SYSTEM: Normal without aneurysm, dissection or stenosis. LEFT CAROTID SYSTEM: Normal without aneurysm, dissection or stenosis. VERTEBRAL ARTERIES: Right dominant configuration. Both origins are clearly patent. There is no dissection, occlusion or flow-limiting stenosis to the skull base (V1-V3 segments). CTA HEAD FINDINGS POSTERIOR CIRCULATION: --Vertebral arteries: Normal V4 segments. --Posterior inferior cerebellar arteries (PICA): Patent origins from the vertebral arteries.  --Anterior inferior cerebellar arteries (AICA): Patent origins from the basilar artery. --Basilar artery: Normal. --Superior cerebellar arteries: Normal. --Posterior cerebral arteries: Normal. Both originate from the basilar artery. Posterior communicating arteries (p-comm) are diminutive or absent. ANTERIOR CIRCULATION: --Intracranial internal carotid arteries: Normal. --Anterior cerebral arteries (ACA): Normal. Both A1 segments are present. Patent anterior communicating artery (a-comm). --Middle cerebral arteries (MCA): Normal. VENOUS SINUSES: As permitted by contrast timing, patent. ANATOMIC VARIANTS: None Review of the MIP images confirms the above findings. CT Brain Perfusion Findings: ASPECTS: 10 CBF (<30%) Volume: 57mL Perfusion (Tmax>6.0s) volume: 79mL Mismatch Volume: 32mL Infarction Location:Areas of  elevated Tmax are located in the right frontal periventricular white matter and left cerebellum. These regions correspond to areas of hypoattenuation on the noncontrast head CT, including an old left cerebellar infarct. IMPRESSION: 1. No emergent large vessel occlusion or high-grade stenosis of the intracranial arteries. 2. Area of ischemia identified in the right frontal periventricular white matter and left cerebellum, corresponding to the areas of hypoattenuation on the noncontrast head CT and possibly artifact/pseudonormalization. No core infarct by CT perfusion analysis. 3. Aortic Atherosclerosis (ICD10-I70.0). 4. 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 Recommend annual imaging followup by CTA or MRA. This recommendation follows 2010 ACCF/AHA/AATS/ACR/ASA/SCA/SCAI/SIR/STS/SVM Guidelines for the Diagnosis and Management of Patients with Thoracic Aortic Disease. Circulation.2010; [id number]. Aortic aneurysm NOS (ICD10-I71.9) Electronically Signed: By: Ulyses Jarred M.D. On: 06/30/2019 20:03   CT Code Stroke CTA Neck W/WO contrast  Addendum Date: 07/01/2019   ADDENDUM REPORT: 07/01/2019 09:49 ADDENDUM: Study  discussed by telephone with Dr. Erlinda Hong on 07/01/2019 at 0930 hours. There is subtle heterogeneity at the lateral right ICA bulb (series 4, image 108) suggesting a small, indistinct filling defect in the vessel. Perhaps this is a small carotid web, or less likely adherent thrombus, with some flow related motion. We discussed that I think Carotid Doppler Ultrasound focused on the Right ICA bulb would best evaluate these possibilities. Electronically Signed   By: Genevie Ann M.D.   On: 07/01/2019 09:49   Result Date: 07/01/2019 CLINICAL DATA:  Left-sided weakness EXAM: CT ANGIOGRAPHY HEAD AND NECK CT PERFUSION BRAIN TECHNIQUE: Multidetector CT imaging of the head and neck was performed using the standard protocol during bolus administration of intravenous contrast. Multiplanar CT image reconstructions and MIPs were obtained to evaluate the vascular anatomy. Carotid stenosis measurements (when applicable) are obtained utilizing NASCET criteria, using the distal internal carotid diameter as the denominator. Multiphase CT imaging of the brain was performed following IV bolus contrast injection. Subsequent parametric perfusion maps were calculated using RAPID software. CONTRAST:  181mL OMNIPAQUE IOHEXOL 350 MG/ML SOLN COMPARISON:  None. FINDINGS: CTA NECK FINDINGS SKELETON: There is no bony spinal canal stenosis. No lytic or blastic lesion. OTHER NECK: Normal pharynx, larynx and major salivary glands. No cervical lymphadenopathy. Unremarkable thyroid gland. UPPER CHEST: No pneumothorax or pleural effusion. No nodules or masses. AORTIC ARCH: There is mild calcific atherosclerosis of the aortic arch. 3.6 cm diameter of descending thoracic aorta arch. Conventional 3 vessel aortic branching pattern. The visualized proximal subclavian arteries are widely patent. RIGHT CAROTID SYSTEM: Normal without aneurysm, dissection or stenosis. LEFT CAROTID SYSTEM: Normal without aneurysm, dissection or stenosis. VERTEBRAL ARTERIES: Right dominant  configuration. Both origins are clearly patent. There is no dissection, occlusion or flow-limiting stenosis to the skull base (V1-V3 segments). CTA HEAD FINDINGS POSTERIOR CIRCULATION: --Vertebral arteries: Normal V4 segments. --Posterior inferior cerebellar arteries (PICA): Patent origins from the vertebral arteries. --Anterior inferior cerebellar arteries (AICA): Patent origins from the basilar artery. --Basilar artery: Normal. --Superior cerebellar arteries: Normal. --Posterior cerebral arteries: Normal. Both originate from the basilar artery. Posterior communicating arteries (p-comm) are diminutive or absent. ANTERIOR CIRCULATION: --Intracranial internal carotid arteries: Normal. --Anterior cerebral arteries (ACA): Normal. Both A1 segments are present. Patent anterior communicating artery (a-comm). --Middle cerebral arteries (MCA): Normal. VENOUS SINUSES: As permitted by contrast timing, patent. ANATOMIC VARIANTS: None Review of the MIP images confirms the above findings. CT Brain Perfusion Findings: ASPECTS: 10 CBF (<30%) Volume: 65mL Perfusion (Tmax>6.0s) volume: 79mL Mismatch Volume: 25mL Infarction Location:Areas of elevated Tmax are located in the right frontal periventricular white matter and  left cerebellum. These regions correspond to areas of hypoattenuation on the noncontrast head CT, including an old left cerebellar infarct. IMPRESSION: 1. No emergent large vessel occlusion or high-grade stenosis of the intracranial arteries. 2. Area of ischemia identified in the right frontal periventricular white matter and left cerebellum, corresponding to the areas of hypoattenuation on the noncontrast head CT and possibly artifact/pseudonormalization. No core infarct by CT perfusion analysis. 3. Aortic Atherosclerosis (ICD10-I70.0). 4. 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 Recommend annual imaging followup by CTA or MRA. This recommendation follows 2010 ACCF/AHA/AATS/ACR/ASA/SCA/SCAI/SIR/STS/SVM Guidelines for the  Diagnosis and Management of Patients with Thoracic Aortic Disease. Circulation.2010; [id number]. Aortic aneurysm NOS (ICD10-I71.9) Electronically Signed: By: Ulyses Jarred M.D. On: 06/30/2019 20:03   MR BRAIN WO CONTRAST  Result Date: 07/01/2019 CLINICAL DATA:  Follow-up examination for acute stroke. EXAM: MRI HEAD WITHOUT CONTRAST TECHNIQUE: Multiplanar, multiecho pulse sequences of the brain and surrounding structures were obtained without intravenous contrast. COMPARISON:  Prior CTs from 06/30/2019. FINDINGS: Brain: Examination degraded by motion artifact. Generalized age-related cerebral atrophy. Extensive patchy and confluent T2/FLAIR hyperintensity within the periventricular deep white matter both cerebral hemispheres as well as the pons, most consistent with chronic small vessel ischemic disease, fairly advanced in nature. Multiple superimposed remote lacunar infarcts seen about the bilateral basal ganglia, thalami, and pons. Chronic left cerebellar infarct noted as well. Approximate 2.5 cm linear focus of restricted diffusion seen extending from the posterior right lentiform nucleus into the posterior right caudate and corona radiata, consistent with an acute ischemic perforator type infarct (series 5, image 73). Few additional scattered ischemic infarcts seen involving the subcortical white matter of the overlying right frontal lobe (w series 5, image 83, 81). Additional subcentimeter acute ischemic infarct noted within the subcortical right occipital lobe (series 5, image 63). Approximate 1 cm acute ischemic infarcts seen involving the inferior right cerebellum (series 5, image 53). No definite associated hemorrhage about these infarcts. No associated mass effect. No mass lesion or midline shift. Mild ventricular prominence related to global parenchymal volume loss without hydrocephalus. No extra-axial fluid collection. Pituitary gland suprasellar region within normal limits. Midline structures  intact. Vascular: Major intracranial vascular flow voids are maintained. Skull and upper cervical spine: Craniocervical junction within normal limits. Bone marrow signal intensity normal. No scalp soft tissue abnormality. Sinuses/Orbits: Globes and orbital soft tissues within normal limits. Mild scattered mucosal thickening noted throughout the paranasal sinuses. No mastoid effusion. Inner ear structures normal. Other: None. IMPRESSION: 1. Multiple scattered acute ischemic infarcts involving the posterior right basal ganglia, right cerebral hemisphere, and right cerebellum as above. No associated hemorrhage or mass effect. 2. Underlying atrophy with advanced chronic microvascular ischemic disease, with multiple remote lacunar infarcts about the bilateral basal ganglia, thalami, and pons, with additional chronic left cerebellar infarct. Electronically Signed   By: Jeannine Boga M.D.   On: 07/01/2019 01:29   EP PPM/ICD IMPLANT  Result Date: 07/03/2019 SURGEON:  Thompson Grayer, MD   PREPROCEDURE DIAGNOSIS:  Cryptogenic Stroke   POSTPROCEDURE DIAGNOSIS:  Cryptogenic Stroke    PROCEDURES:  1. Implantable loop recorder implantation   INTRODUCTION:  Russell Russell is a 69 y.o. male with a history of unexplained stroke who presents today for implantable loop implantation.  The patient has had a cryptogenic stroke.  Despite an extensive workup by neurology, no reversible causes have been identified.  he has worn telemetry during which he did not have arrhythmias.  There is significant concern for possible atrial fibrillation as the cause for the patients  stroke.  The patient therefore presents today for implantable loop implantation.   DESCRIPTION OF PROCEDURE:  Informed written consent was obtained.  The patient required no sedation for the procedure today.  The patients left chest was prepped and draped. Mapping over the patient's chest was performed to identify the appropriate ILR site.  This area was found to be  the left parasternal region over the 3rd-4th intercostal space.  The skin overlying this region was infiltrated with lidocaine for local analgesia.  A 0.5-cm incision was made at the implant site.  A subcutaneous ILR pocket was fashioned using a combination of sharp and blunt dissection.  A Medtronic Reveal Linq model [id number] implantable loop recorder was then placed into the pocket R waves were very prominent and measured > 0.2 mV. EBL<1 ml.  Steri- Strips and a sterile dressing were then applied.  There were no early apparent complications.   CONCLUSIONS:  1. Successful implantation of a Medtronic Reveal LINQ implantable loop recorder for cryptogenic stroke  2. No early apparent complications. Thompson Grayer MD, [facility] 07/03/2019 12:57 PM   CT Code Stroke Cerebral Perfusion with contrast  Addendum Date: 07/01/2019   ADDENDUM REPORT: 07/01/2019 09:49 ADDENDUM: Study discussed by telephone with Dr. Erlinda Hong on 07/01/2019 at 0930 hours. There is subtle heterogeneity at the lateral right ICA bulb (series 4, image 108) suggesting a small, indistinct filling defect in the vessel. Perhaps this is a small carotid web, or less likely adherent thrombus, with some flow related motion. We discussed that I think Carotid Doppler Ultrasound focused on the Right ICA bulb would best evaluate these possibilities. Electronically Signed   By: Genevie Ann M.D.   On: 07/01/2019 09:49   Result Date: 07/01/2019 CLINICAL DATA:  Left-sided weakness EXAM: CT ANGIOGRAPHY HEAD AND NECK CT PERFUSION BRAIN TECHNIQUE: Multidetector CT imaging of the head and neck was performed using the standard protocol during bolus administration of intravenous contrast. Multiplanar CT image reconstructions and MIPs were obtained to evaluate the vascular anatomy. Carotid stenosis measurements (when applicable) are obtained utilizing NASCET criteria, using the distal internal carotid diameter as the denominator. Multiphase CT imaging of the brain was performed following IV  bolus contrast injection. Subsequent parametric perfusion maps were calculated using RAPID software. CONTRAST:  18mL OMNIPAQUE IOHEXOL 350 MG/ML SOLN COMPARISON:  None. FINDINGS: CTA NECK FINDINGS SKELETON: There is no bony spinal canal stenosis. No lytic or blastic lesion. OTHER NECK: Normal pharynx, larynx and major salivary glands. No cervical lymphadenopathy. Unremarkable thyroid gland. UPPER CHEST: No pneumothorax or pleural effusion. No nodules or masses. AORTIC ARCH: There is mild calcific atherosclerosis of the aortic arch. 3.6 cm diameter of descending thoracic aorta arch. Conventional 3 vessel aortic branching pattern. The visualized proximal subclavian arteries are widely patent. RIGHT CAROTID SYSTEM: Normal without aneurysm, dissection or stenosis. LEFT CAROTID SYSTEM: Normal without aneurysm, dissection or stenosis. VERTEBRAL ARTERIES: Right dominant configuration. Both origins are clearly patent. There is no dissection, occlusion or flow-limiting stenosis to the skull base (V1-V3 segments). CTA HEAD FINDINGS POSTERIOR CIRCULATION: --Vertebral arteries: Normal V4 segments. --Posterior inferior cerebellar arteries (PICA): Patent origins from the vertebral arteries. --Anterior inferior cerebellar arteries (AICA): Patent origins from the basilar artery. --Basilar artery: Normal. --Superior cerebellar arteries: Normal. --Posterior cerebral arteries: Normal. Both originate from the basilar artery. Posterior communicating arteries (p-comm) are diminutive or absent. ANTERIOR CIRCULATION: --Intracranial internal carotid arteries: Normal. --Anterior cerebral arteries (ACA): Normal. Both A1 segments are present. Patent anterior communicating artery (a-comm). --Middle cerebral arteries (MCA): Normal. VENOUS SINUSES:  As permitted by contrast timing, patent. ANATOMIC VARIANTS: None Review of the MIP images confirms the above findings. CT Brain Perfusion Findings: ASPECTS: 10 CBF (<30%) Volume: 79mL Perfusion  (Tmax>6.0s) volume: 59mL Mismatch Volume: 53mL Infarction Location:Areas of elevated Tmax are located in the right frontal periventricular white matter and left cerebellum. These regions correspond to areas of hypoattenuation on the noncontrast head CT, including an old left cerebellar infarct. IMPRESSION: 1. No emergent large vessel occlusion or high-grade stenosis of the intracranial arteries. 2. Area of ischemia identified in the right frontal periventricular white matter and left cerebellum, corresponding to the areas of hypoattenuation on the noncontrast head CT and possibly artifact/pseudonormalization. No core infarct by CT perfusion analysis. 3. Aortic Atherosclerosis (ICD10-I70.0). 4. 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 Recommend annual imaging followup by CTA or MRA. This recommendation follows 2010 ACCF/AHA/AATS/ACR/ASA/SCA/SCAI/SIR/STS/SVM Guidelines for the Diagnosis and Management of Patients with Thoracic Aortic Disease. Circulation.2010; [id number]. Aortic aneurysm NOS (ICD10-I71.9) Electronically Signed: By: Ulyses Jarred M.D. On: 06/30/2019 20:03   ECHOCARDIOGRAM COMPLETE BUBBLE STUDY  Result Date: 07/01/2019    ECHOCARDIOGRAM REPORT   Patient Name:   Russell Russell Date of Exam: 07/01/2019 Medical Rec #:  [id number]      Height:       70.0 in Accession #:    [id number]     Weight:       151.0 lb Date of Birth:  November 20, 1950     BSA:          1.852 m Patient Age:    57 years       BP:           161/130 mmHg Patient Gender: M              HR:           83 bpm. Exam Location:  Inpatient Procedure: 2D Echo, Color Doppler, Cardiac Doppler and Saline Contrast Bubble            Study Indications:    Stroke i163.9  History:        Patient has no prior history of Echocardiogram examinations.                 Risk Factors:Hypertension and Dyslipidemia.  Sonographer:    Raquel Sarna Senior RDCS Referring Phys: [place]  Sonographer Comments: Technically difficult study due to poor echo windows, suboptimal  parasternal window and suboptimal apical window. Technically difficult due to lung interference and respiratory variation. Patient coughing and moving thoughout exam. No parasternal, apical, or suprasternal window. IMPRESSIONS  1. Very poor study. All views basically obtained from the subcostal views.  2. Left ventricular ejection fraction, by estimation, is 55 to 60%. The left ventricle has normal function. The left ventricle has no regional wall motion abnormalities. Left ventricular diastolic function could not be evaluated.  3. Right ventricular systolic function is normal. The right ventricular size is normal.  4. The mitral valve is grossly normal. No evidence of mitral valve regurgitation. No evidence of mitral stenosis.  5. The aortic valve is grossly normal. Aortic valve regurgitation is not visualized. No aortic stenosis is present.  6. The inferior vena cava is normal in size with greater than 50% respiratory variability, suggesting right atrial pressure of 3 mmHg.  7. Agitated saline contrast bubble study was negative, with no evidence of any interatrial shunt. Conclusion(s)/Recommendation(s): No intracardiac source of embolism detected on this transthoracic study. A transesophageal echocardiogram is recommended to exclude cardiac source of embolism if  clinically indicated. FINDINGS  Left Ventricle: Left ventricular ejection fraction, by estimation, is 55 to 60%. The left ventricle has normal function. The left ventricle has no regional wall motion abnormalities. The left ventricular internal cavity size was normal in size. There is  no left ventricular hypertrophy. Left ventricular diastolic function could not be evaluated due to nondiagnostic images. Left ventricular diastolic function could not be evaluated. Right Ventricle: The right ventricular size is normal. No increase in right ventricular wall thickness. Right ventricular systolic function is normal. Left Atrium: Left atrial size was normal in  size. Right Atrium: Right atrial size was normal in size. Pericardium: Trivial pericardial effusion is present. Mitral Valve: The mitral valve is grossly normal. No evidence of mitral valve regurgitation. No evidence of mitral valve stenosis. Tricuspid Valve: The tricuspid valve is grossly normal. Tricuspid valve regurgitation is trivial. No evidence of tricuspid stenosis. Aortic Valve: The aortic valve is grossly normal. Aortic valve regurgitation is not visualized. No aortic stenosis is present. Pulmonic Valve: The pulmonic valve was grossly normal. Pulmonic valve regurgitation is not visualized. No evidence of pulmonic stenosis. Aorta: The aortic root is normal in size and structure. Venous: The inferior vena cava is normal in size with greater than 50% respiratory variability, suggesting right atrial pressure of 3 mmHg. IAS/Shunts: No atrial level shunt detected by color flow Doppler. Agitated saline contrast was given intravenously to evaluate for intracardiac shunting. Agitated saline contrast bubble study was negative, with no evidence of any interatrial shunt.  LEFT VENTRICLE PLAX 2D LVIDd:         3.80 cm  Diastology LVIDs:         2.50 cm  LV e' lateral: 6.20 cm/s LV PW:         1.10 cm  LV e' medial:  4.57 cm/s LV IVS:        1.00 cm LVOT diam:     2.10 cm LV SV:         48 LV SV Index:   26 LVOT Area:     3.46 cm  RIGHT VENTRICLE RV S prime:     9.46 cm/s TAPSE (M-mode): 1.8 cm LEFT ATRIUM           Index       RIGHT ATRIUM           Index LA diam:      3.30 cm 1.78 cm/m  RA Area:     19.00 cm LA Vol (A4C): 50.4 ml 27.18 ml/m RA Volume:   57.90 ml  31.26 ml/m  AORTIC VALVE LVOT Vmax:   64.00 cm/s LVOT Vmean:  50.200 cm/s LVOT VTI:    0.140 m  AORTA Ao Root diam: 3.60 cm  SHUNTS Systemic VTI:  0.14 m Systemic Diam: 2.10 cm Eleonore Chiquito MD Electronically signed by Eleonore Chiquito MD Signature Date/Time: 07/01/2019/3:25:46 PM    Final    CT HEAD CODE STROKE WO CONTRAST  Result Date:  06/30/2019 CLINICAL DATA:  Code stroke.  Left-sided weakness EXAM: CT HEAD WITHOUT CONTRAST TECHNIQUE: Contiguous axial images were obtained from the base of the skull through the vertex without intravenous contrast. COMPARISON:  None. FINDINGS: Brain: There is no mass, hemorrhage or extra-axial collection. There is generalized atrophy without lobar predilection. There is hypoattenuation of the periventricular white matter, most commonly indicating chronic ischemic microangiopathy. Old left basal ganglia small vessel infarct. Vascular: No abnormal hyperdensity of the major intracranial arteries or dural venous sinuses. No intracranial atherosclerosis. Skull: The visualized  skull base, calvarium and extracranial soft tissues are normal. Sinuses/Orbits: No fluid levels or advanced mucosal thickening of the visualized paranasal sinuses. No mastoid or middle ear effusion. The orbits are normal. ASPECTS [facility] Stroke Program Early CT Score) - Ganglionic level infarction (caudate, lentiform nuclei, internal capsule, insula, M1-M3 cortex): 7 - Supraganglionic infarction (M4-M6 cortex): 3 Total score (0-10 with 10 being normal): 10 IMPRESSION: 1. Chronic ischemic microangiopathy and generalized atrophy without acute intracranial abnormality. 2. ASPECTS is 10. * These results were communicated to Dr. Kerney Elbe at 7:30 pm on 06/30/2019 by text page via the [facility] messaging system. Electronically Signed   By: Ulyses Jarred M.D.   On: 06/30/2019 19:30   VAS US CAROTID  Result Date: 07/02/2019 Carotid Arterial Duplex Study Indications:       CVA and follow up possible carotid web versus thrombus seen                    on CT. Comparison Study:  No prior study Performing Technologist: Maudry Mayhew MHA, RDMS, RVT, RDCS  Examination Guidelines: A complete evaluation includes B-mode imaging, spectral Doppler, color Doppler, and power Doppler as needed of all accessible portions of each vessel. Bilateral testing is  considered an integral part of a complete examination. Limited examinations for reoccurring indications may be performed as noted.  Right Carotid Findings: +----------+--------+--------+--------+------------------+--------+           PSV cm/sEDV cm/sStenosisPlaque DescriptionComments +----------+--------+--------+--------+------------------+--------+ CCA Prox  51      14                                         +----------+--------+--------+--------+------------------+--------+ CCA Distal44      16                                         +----------+--------+--------+--------+------------------+--------+ ICA Prox  17      4                                          +----------+--------+--------+--------+------------------+--------+ ICA Distal59      26                                         +----------+--------+--------+--------+------------------+--------+ ECA       44      14                                         +----------+--------+--------+--------+------------------+--------+   Summary: Right Carotid: Velocities in the right ICA are consistent with a 1-39% stenosis.                There is a heterogenous mobile area of the anterior vessel wall                at the carotid bulb/proximal ICA (Images 16-19). Cannot                differentiate between possible carotid web versus thrombus versus  unknown etiology.  *See table(s) above for measurements and observations.  Electronically signed by Antony Contras MD on 07/02/2019 at 15:44:11 AM.   Final    VAS [place] LOWER EXTREMITY VENOUS (DVT)  Result Date: 07/02/2019  Lower Venous DVTStudy Indications: Stroke.  Risk Factors: None identified. Comparison Study: No prior studies. Performing Technologist: Oliver Hum RVT  Examination Guidelines: A complete evaluation includes B-mode imaging, spectral Doppler, color Doppler, and power Doppler as needed of all accessible portions of each vessel. Bilateral testing  is considered an integral part of a complete examination. Limited examinations for reoccurring indications may be performed as noted. The reflux portion of the exam is performed with the patient in reverse Trendelenburg.  +---------+---------------+---------+-----------+----------+--------------+ RIGHT    CompressibilityPhasicitySpontaneityPropertiesThrombus Aging +---------+---------------+---------+-----------+----------+--------------+ CFV      Full           Yes      Yes                                 +---------+---------------+---------+-----------+----------+--------------+ SFJ      Full                                                        +---------+---------------+---------+-----------+----------+--------------+ FV Prox  Full                                                        +---------+---------------+---------+-----------+----------+--------------+ FV Mid   Full                                                        +---------+---------------+---------+-----------+----------+--------------+ FV DistalFull                                                        +---------+---------------+---------+-----------+----------+--------------+ PFV      Full                                                        +---------+---------------+---------+-----------+----------+--------------+ POP      Full           Yes      Yes                                 +---------+---------------+---------+-----------+----------+--------------+ PTV      Full                                                        +---------+---------------+---------+-----------+----------+--------------+  PERO     Full                                                        +---------+---------------+---------+-----------+----------+--------------+   +---------+---------------+---------+-----------+----------+--------------+ LEFT      CompressibilityPhasicitySpontaneityPropertiesThrombus Aging +---------+---------------+---------+-----------+----------+--------------+ CFV      Full           Yes      Yes                                 +---------+---------------+---------+-----------+----------+--------------+ SFJ      Full                                                        +---------+---------------+---------+-----------+----------+--------------+ FV Prox  Full                                                        +---------+---------------+---------+-----------+----------+--------------+ FV Mid   Full                                                        +---------+---------------+---------+-----------+----------+--------------+ FV DistalFull                                                        +---------+---------------+---------+-----------+----------+--------------+ PFV      Full                                                        +---------+---------------+---------+-----------+----------+--------------+ POP      Full           Yes      Yes                                 +---------+---------------+---------+-----------+----------+--------------+ PTV      Full                                                        +---------+---------------+---------+-----------+----------+--------------+ PERO     Full                                                        +---------+---------------+---------+-----------+----------+--------------+       Summary: RIGHT: - There is no evidence of deep vein thrombosis in the lower extremity.  - No cystic structure found in the popliteal fossa.  LEFT: - There is no evidence of deep vein thrombosis in the lower extremity.  - No cystic structure found in the popliteal fossa.  *See table(s) above for measurements and observations. Electronically signed by Monica Martinez MD on 07/02/2019 at 8:09:03 PM.    Final     Phillips Climes  M.D on 07/06/2019 at 4:27 PM  Between 7am to 7pm - Pager - [phone]  After 7pm go to [email or web address] - password [facility]  [facility] -  Office  [phone]

## 2019-07-06 NOTE — Progress Notes (Signed)
Inpatient Rehab Admissions Coordinator:   I have no beds available for this patient to admit to CIR today.  Will continue to follow for timing of potential admission pending bed availability.   Shann Medal, PT, DPT Admissions Coordinator [phone] 07/06/19  1:07 PM

## 2019-07-06 NOTE — Progress Notes (Signed)
Physical Therapy Treatment Patient Details Name: Russell Russell MRN: [id number] DOB: Dec 03, 1950 Today's Date: 07/06/2019    History of Present Illness 69 year old male admitted 06/30/19 code stroke after being found down at home (from approximately 10:30AM-5:30PM). Patient found to have acute stroke R frontal lobe and L cerebellum, likely embolic in nature. Neurology consulted. CTA head and neck as well as CUS concerning for right ICA bulb questionable filling defect. Patient outside tPA window and no invasive intervention. He continues to have LUE and LLE weakness. BP high on arrival (186/125). Permissive hypertension (<220/120) but gradually normalize in 2-3 days per neurology note. Patient on CIWA due to alcohol use.  Loop recorder inserted 07/03/19. PMH: HTN, alcohol abuse (ongoing use), HLD, GERD, PUD, tobacco use     PT Comments    Patient continues to require two person assist for transfer bed>chair. He has a L lateral and anterior lean and requires verbal, visual, and tactile cues along with physical assistance to reach midline. Continued recommendation for skilled PT services and discharge to inpatient rehab for intense therapy.   Follow Up Recommendations  CIR     Equipment Recommendations  Other (comment)(TBD at next level of care)    Recommendations for Other Services Rehab consult     Precautions / Restrictions Precautions Precautions: Fall;Other (comment) Precaution Comments: permissive HTN initially (<220/120) then normotension, L sided weakness, aspiration precautions (dysphagia 2 with thins), O2 sat >94% Restrictions Weight Bearing Restrictions: No    Mobility  Bed Mobility Overal bed mobility: Needs Assistance Bed Mobility: Supine to Sit     Supine to sit: Max assist;+2 for physical assistance;+2 for safety/equipment;HOB elevated     General bed mobility comments: Encouraged use of bedrail to assist.  Transfers Overall transfer level: Needs assistance    Transfers: Sit to/from Stand;Stand Pivot Transfers Sit to Stand: Max assist;+2 physical assistance;+2 safety/equipment;From elevated surface;Mod assist Stand pivot transfers: Mod assist;Max assist       General transfer comment: Use of Stedy for transfer bed>chair and sit<>stand from chair. Patient still with L and anterior lean. Cues and assist for weight shifting to right to reach midline.    Ambulation/Gait   General Gait Details: not safe to attempt yet  Modified Rankin (Stroke Patients Only) Modified Rankin (Stroke Patients Only) Pre-Morbid Rankin Score: No symptoms Modified Rankin: Severe disability     Balance Overall balance assessment: Needs assistance Sitting-balance support: Feet supported;Single extremity supported;Bilateral upper extremity supported Sitting balance-Leahy Scale: (Zero to Poor) Sitting balance - Comments: Verbal, tactile, and visual cues for improved midline positioning. Patient sits with L and anterior lean. With modA, patient is able to reach for foot rail of bed to assist with maintaining sitting balance. Postural control: Left lateral lean;Other (comment)(anterior lean) Standing balance support: Bilateral upper extremity supported(BLEs blocked with Stedy) Standing balance-Leahy Scale: Zero Standing balance comment: L lateral and anterior lean. Tactile, visual, and verbal cues for attempting to progress to midline positioning but patient unable to reach or maintain midline without assistance. Increased assistance required as patient fatigues.    Cognition Arousal/Alertness: Awake/alert Behavior During Therapy: WFL for tasks assessed/performed Overall Cognitive Status: Within Functional Limits for tasks assessed        General Comments General comments (skin integrity, edema, etc.): LLE strength grossly 2-/5 now. HR up to 145 bpm with mobility, 123bpm at rest after mobility.      Pertinent Vitals/Pain Pain Assessment: No/denies pain            PT Goals (current goals  can now be found in the care plan section) Progress towards PT goals: Progressing toward goals    Frequency    Min 3X/week      PT Plan Current plan remains appropriate    Co-evaluation PT/OT/SLP Co-Evaluation/Treatment: Yes Reason for Co-Treatment: Complexity of the patient's impairments (multi-system involvement);For patient/therapist safety          AM-PAC PT "6 Clicks" Mobility   Outcome Measure  Help needed turning from your back to your side while in a flat bed without using bedrails?: A Lot Help needed moving from lying on your back to sitting on the side of a flat bed without using bedrails?: A Lot Help needed moving to and from a bed to a chair (including a wheelchair)?: A Lot Help needed standing up from a chair using your arms (e.g., wheelchair or bedside chair)?: Total Help needed to walk in hospital room?: Total Help needed climbing 3-5 steps with a railing? : Total 6 Click Score: 9    End of Session Equipment Utilized During Treatment: Gait belt Activity Tolerance: Patient tolerated treatment well Patient left: in chair;with call bell/phone within reach;with chair alarm set;Other (comment)(wife in room, will be here until 1pm) Nurse Communication: Mobility status;Need for lift equipment;Other (comment)(HR) PT Visit Diagnosis: Hemiplegia and hemiparesis;Other abnormalities of gait and mobility (R26.89)     Time: [id number] PT Time Calculation (min) (ACUTE ONLY): 32 min  Charges:  $Neuromuscular Re-education: 8-22 mins                     Birdie Hopes, PT, DPT Acute Rehab [phone] office     Birdie Hopes 07/06/2019, 1:01 PM

## 2019-07-06 NOTE — Progress Notes (Signed)
Occupational Therapy Treatment Patient Details Name: Russell Russell MRN: [id number] DOB: [birth year] Today's Date: 07/06/2019    History of present illness 69 year old male admitted 06/30/19 code stroke after being found down at home (from approximately 10:30AM-5:30PM). Patient found to have acute stroke R frontal lobe and L cerebellum, likely embolic in nature. Neurology consulted. CTA head and neck as well as CUS concerning for right ICA bulb questionable filling defect. Patient outside tPA window and no invasive intervention. He continues to have LUE and LLE weakness. BP high on arrival (186/125). Permissive hypertension (<220/120) but gradually normalize in 2-3 days per neurology note. Patient on CIWA due to alcohol use.  Loop recorder inserted 07/03/19. PMH: HTN, alcohol abuse (ongoing use), HLD, GERD, PUD, tobacco use    OT comments  Pt with overall improving L UE strength (grossly 3-/5) with WFL AROM and fair L hand grip strength. Pt demonstrated improved ability to advance B LE to EOB, but continues to require Max A + 2 for bed mobility and sit to stand trials with Stedy. Pt with heavy left and anterior leans sitting and standing that worsen when pt fatigues. Guided pt in exercises to weight bear through R elbow and find midline. Pt transferred to chair with Stedy and repositioning to encourage optimal upright posture. Pt with improved coordination in locating washcloth to wipe mouth of secretions today. Pt remains motivated to return to PLOF. Wife present during session and supportive - noting small improvements with pt's functional abilities daily. Recommend CIR for intensive rehab.    Follow Up Recommendations  CIR;Supervision/Assistance - 24 hour    Equipment Recommendations  Other (comment)(TBD)    Recommendations for Other Services Rehab consult    Precautions / Restrictions Precautions Precautions: Fall;Other (comment) Precaution Comments: permissive HTN initially (<220/120) then  normotension, L sided weakness, aspiration precautions (dysphagia 2 with thins), O2 sat >94% Restrictions Weight Bearing Restrictions: No       Mobility Bed Mobility Overal bed mobility: Needs Assistance Bed Mobility: Supine to Sit     Supine to sit: Max assist;+2 for physical assistance;+2 for safety/equipment;HOB elevated     General bed mobility comments: Encouraged use of bedrail to assist. Improved ability to advance B LE to EOB  Transfers Overall transfer level: Needs assistance   Transfers: Sit to/from Stand Sit to Stand: Max assist;+2 physical assistance;+2 safety/equipment;From elevated surface;Mod assist         General transfer comment: Use of Stedy for transfer bed>chair and sit<>stand from chair. Patient still with L and anterior lean. Cues and assist for weight shifting to right to reach midline.      Balance Overall balance assessment: Needs assistance Sitting-balance support: Feet supported;Single extremity supported;Bilateral upper extremity supported Sitting balance-Leahy Scale: Poor Sitting balance - Comments: Verbal, tactile, and visual cues for improved midline positioning. Patient sits with L and anterior lean. With modA, patient is able to reach for foot rail of bed to assist with maintaining sitting balance. Postural control: Left lateral lean;Other (comment)(anterior lean) Standing balance support: Bilateral upper extremity supported Standing balance-Leahy Scale: Zero Standing balance comment: L lateral and anterior lean. Tactile, visual, and verbal cues for attempting to progress to midline positioning but patient unable to reach or maintain midline without assistance. Increased assistance required as patient fatigues.                           ADL either performed or assessed with clinical judgement   ADL Overall ADL's :  Needs assistance/impaired     Grooming: Minimal assistance;Sitting;Wash/dry face Grooming Details (indicate cue  type and reason): Assistance to wipe mouth due to difficulty keeping secretions in mouth                               General ADL Comments: Pt requires increased assistance for ADLs due to decreased strength in L UE/LE and core, decreased sitting balance, coordination, endurance      Vision       Perception     Praxis      Cognition Arousal/Alertness: Awake/alert Behavior During Therapy: WFL for tasks assessed/performed Overall Cognitive Status: Within Functional Limits for tasks assessed                                          Exercises     Shoulder Instructions       General Comments LLE strength grossly 2-/5 now. HR up to 145 bpm with mobility, 123bpm at rest after mobility.    Pertinent Vitals/ Pain       Pain Assessment: No/denies pain  Home Living                                          Prior Functioning/Environment              Frequency  Min 2X/week        Progress Toward Goals  OT Goals(current goals can now be found in the care plan section)  Progress towards OT goals: Progressing toward goals  Acute Rehab OT Goals Patient Stated Goal: to go home with wife after rehab OT Goal Formulation: With patient/family Time For Goal Achievement: 07/16/19 Potential to Achieve Goals: Good ADL Goals Pt Will Perform Grooming: with supervision;sitting Pt Will Perform Upper Body Bathing: sitting;with mod assist Pt Will Perform Lower Body Bathing: with mod assist;sitting/lateral leans Pt Will Perform Upper Body Dressing: with min assist;sitting Pt Will Perform Lower Body Dressing: with mod assist;sitting/lateral leans Pt Will Transfer to Toilet: with mod assist;with +2 assist;stand pivot transfer;bedside commode Pt Will Perform Toileting - Clothing Manipulation and hygiene: with mod assist;sitting/lateral leans Pt/caregiver will Perform Home Exercise Program: Increased strength;Left upper extremity;With  Supervision;With written HEP provided  Plan Discharge plan remains appropriate    Co-evaluation    PT/OT/SLP Co-Evaluation/Treatment: Yes Reason for Co-Treatment: Complexity of the patient's impairments (multi-system involvement);For patient/therapist safety   OT goals addressed during session: ADL's and self-care      AM-PAC OT "6 Clicks" Daily Activity     Outcome Measure   Help from another person eating meals?: A Little Help from another person taking care of personal grooming?: A Little Help from another person toileting, which includes using toliet, bedpan, or urinal?: Total Help from another person bathing (including washing, rinsing, drying)?: A Lot Help from another person to put on and taking off regular upper body clothing?: A Lot Help from another person to put on and taking off regular lower body clothing?: Total 6 Click Score: 12    End of Session Equipment Utilized During Treatment: Gait belt  OT Visit Diagnosis: Unsteadiness on feet (R26.81);Other abnormalities of gait and mobility (R26.89);Muscle weakness (generalized) (M62.81)   Activity Tolerance Patient tolerated treatment well;Patient limited by fatigue  Patient Left in chair;with call bell/phone within reach;with chair alarm set;with family/visitor present   Nurse Communication Mobility status;Other (comment)(HR)        Time: [id number] OT Time Calculation (min): 32 min  Charges: OT General Charges $OT Visit: 1 Visit OT Treatments $Therapeutic Activity: 8-22 mins  Layla Maw, OTR/L   Layla Maw 07/06/2019, 2:12 PM

## 2019-07-07 ENCOUNTER — Inpatient Hospital Stay (HOSPITAL_COMMUNITY)
Admission: RE | Admit: 2019-07-07 | Discharge: 2019-08-05 | DRG: 056 | Disposition: A | Discharge status: Home / Self Care | Payer: BC Managed Care – PPO | Source: Intra-hospital | Attending: Physical Medicine & Rehabilitation | Admitting: Physical Medicine & Rehabilitation

## 2019-07-07 ENCOUNTER — Other Ambulatory Visit: Payer: Self-pay

## 2019-07-07 ENCOUNTER — Encounter (HOSPITAL_COMMUNITY): Payer: Self-pay | Admitting: Physical Medicine & Rehabilitation

## 2019-07-07 DIAGNOSIS — I639 Cerebral infarction, unspecified: Secondary | ICD-10-CM

## 2019-07-07 DIAGNOSIS — R1310 Dysphagia, unspecified: Secondary | ICD-10-CM | POA: Diagnosis present

## 2019-07-07 DIAGNOSIS — Z7982 Long term (current) use of aspirin: Secondary | ICD-10-CM

## 2019-07-07 DIAGNOSIS — I6340 Cerebral infarction due to embolism of unspecified cerebral artery: Secondary | ICD-10-CM | POA: Diagnosis not present

## 2019-07-07 DIAGNOSIS — A419 Sepsis, unspecified organism: Secondary | ICD-10-CM | POA: Diagnosis not present

## 2019-07-07 DIAGNOSIS — I10 Essential (primary) hypertension: Secondary | ICD-10-CM | POA: Diagnosis present

## 2019-07-07 DIAGNOSIS — I6381 Other cerebral infarction due to occlusion or stenosis of small artery: Secondary | ICD-10-CM | POA: Diagnosis present

## 2019-07-07 DIAGNOSIS — E8809 Other disorders of plasma-protein metabolism, not elsewhere classified: Secondary | ICD-10-CM

## 2019-07-07 DIAGNOSIS — Z20822 Contact with and (suspected) exposure to covid-19: Secondary | ICD-10-CM | POA: Diagnosis present

## 2019-07-07 DIAGNOSIS — F17210 Nicotine dependence, cigarettes, uncomplicated: Secondary | ICD-10-CM | POA: Diagnosis present

## 2019-07-07 DIAGNOSIS — Z720 Tobacco use: Secondary | ICD-10-CM | POA: Diagnosis present

## 2019-07-07 DIAGNOSIS — R799 Abnormal finding of blood chemistry, unspecified: Secondary | ICD-10-CM | POA: Diagnosis not present

## 2019-07-07 DIAGNOSIS — K5900 Constipation, unspecified: Secondary | ICD-10-CM | POA: Diagnosis present

## 2019-07-07 DIAGNOSIS — I69392 Facial weakness following cerebral infarction: Secondary | ICD-10-CM

## 2019-07-07 DIAGNOSIS — K219 Gastro-esophageal reflux disease without esophagitis: Secondary | ICD-10-CM | POA: Diagnosis present

## 2019-07-07 DIAGNOSIS — F1010 Alcohol abuse, uncomplicated: Secondary | ICD-10-CM | POA: Diagnosis present

## 2019-07-07 DIAGNOSIS — D72819 Decreased white blood cell count, unspecified: Secondary | ICD-10-CM | POA: Diagnosis present

## 2019-07-07 DIAGNOSIS — I69354 Hemiplegia and hemiparesis following cerebral infarction affecting left non-dominant side: Secondary | ICD-10-CM | POA: Diagnosis present

## 2019-07-07 DIAGNOSIS — R509 Fever, unspecified: Secondary | ICD-10-CM

## 2019-07-07 DIAGNOSIS — Z7902 Long term (current) use of antithrombotics/antiplatelets: Secondary | ICD-10-CM

## 2019-07-07 DIAGNOSIS — R7303 Prediabetes: Secondary | ICD-10-CM | POA: Diagnosis present

## 2019-07-07 DIAGNOSIS — R32 Unspecified urinary incontinence: Secondary | ICD-10-CM | POA: Diagnosis present

## 2019-07-07 DIAGNOSIS — R5081 Fever presenting with conditions classified elsewhere: Secondary | ICD-10-CM | POA: Diagnosis not present

## 2019-07-07 DIAGNOSIS — E785 Hyperlipidemia, unspecified: Secondary | ICD-10-CM | POA: Diagnosis present

## 2019-07-07 DIAGNOSIS — R0989 Other specified symptoms and signs involving the circulatory and respiratory systems: Secondary | ICD-10-CM

## 2019-07-07 DIAGNOSIS — I69391 Dysphagia following cerebral infarction: Secondary | ICD-10-CM | POA: Diagnosis not present

## 2019-07-07 DIAGNOSIS — D708 Other neutropenia: Secondary | ICD-10-CM | POA: Diagnosis not present

## 2019-07-07 DIAGNOSIS — N179 Acute kidney failure, unspecified: Secondary | ICD-10-CM | POA: Diagnosis not present

## 2019-07-07 DIAGNOSIS — R030 Elevated blood-pressure reading, without diagnosis of hypertension: Secondary | ICD-10-CM | POA: Diagnosis not present

## 2019-07-07 DIAGNOSIS — Z79899 Other long term (current) drug therapy: Secondary | ICD-10-CM

## 2019-07-07 LAB — CBC
HCT: 50.6 % (ref 39.0–52.0)
Hemoglobin: 17.5 g/dL — ABNORMAL HIGH (ref 13.0–17.0)
MCH: 31.6 pg (ref 26.0–34.0)
MCHC: 34.6 g/dL (ref 30.0–36.0)
MCV: 91.5 fL (ref 80.0–100.0)
Platelets: 245 10*3/uL (ref 150–400)
RBC: 5.53 MIL/uL (ref 4.22–5.81)
RDW: 15.9 % — ABNORMAL HIGH (ref 11.5–15.5)
WBC: 4.5 10*3/uL (ref 4.0–10.5)
nRBC: 0 % (ref 0.0–0.2)

## 2019-07-07 LAB — BASIC METABOLIC PANEL
Anion gap: 10 (ref 5–15)
BUN: 20 mg/dL (ref 8–23)
CO2: 25 mmol/L (ref 22–32)
Calcium: 9.3 mg/dL (ref 8.9–10.3)
Chloride: 102 mmol/L (ref 98–111)
Creatinine, Ser: 1.02 mg/dL (ref 0.61–1.24)
GFR calc Af Amer: >60 mL/min (ref >60)
GFR calc non Af Amer: >60 mL/min (ref >60)
Glucose, Bld: 107 mg/dL — ABNORMAL HIGH (ref 70–99)
Potassium: 3.9 mmol/L (ref 3.5–5.1)
Sodium: 137 mmol/L (ref 135–145)

## 2019-07-07 LAB — VITAMIN B1: Vitamin B1 (Thiamine): 81.2 nmol/L (ref 66.5–200.0)

## 2019-07-07 MED ORDER — THIAMINE HCL 100 MG/ML IJ SOLN: 100.0000 mg | Freq: Every day | INTRAMUSCULAR | Status: DC

## 2019-07-07 MED ORDER — ASPIRIN 325 MG PO TABS
325.0000 mg | ORAL_TABLET | Freq: Every day | ORAL | Status: DC
  Administered 2019-07-08 – 2019-08-05 (×29): 325 mg via ORAL
  Filled 2019-07-07 (×30): qty 1

## 2019-07-07 MED ORDER — PANTOPRAZOLE SODIUM 40 MG PO TBEC: 40.0000 mg | DELAYED_RELEASE_TABLET | Freq: Every day | ORAL | Status: DC

## 2019-07-07 MED ORDER — ADULT MULTIVITAMIN W/MINERALS CH
1.0000 | ORAL_TABLET | Freq: Every day | ORAL | Status: DC
  Administered 2019-07-08 – 2019-08-05 (×29): 1 via ORAL
  Filled 2019-07-07 (×30): qty 1

## 2019-07-07 MED ORDER — ENOXAPARIN SODIUM 40 MG/0.4ML ~~LOC~~ SOLN
40.0000 mg | SUBCUTANEOUS | Status: DC
  Administered 2019-07-07 – 2019-08-04 (×29): 40 mg via SUBCUTANEOUS
  Filled 2019-07-07 (×29): qty 0.4

## 2019-07-07 MED ORDER — THIAMINE HCL 100 MG PO TABS
100.0000 mg | ORAL_TABLET | Freq: Every day | ORAL | Status: DC
  Administered 2019-07-08 – 2019-08-05 (×29): 100 mg via ORAL
  Filled 2019-07-07 (×29): qty 1

## 2019-07-07 MED ORDER — FOLIC ACID 1 MG PO TABS: 1.0000 mg | ORAL_TABLET | Freq: Every day | ORAL | 3 refills | Status: DC

## 2019-07-07 MED ORDER — ASPIRIN 300 MG RE SUPP
300.0000 mg | Freq: Every day | RECTAL | Status: DC
  Filled 2019-07-07 (×4): qty 1

## 2019-07-07 MED ORDER — ASPIRIN 325 MG PO TABS: 325.0000 mg | ORAL_TABLET | Freq: Every day | ORAL | Status: DC

## 2019-07-07 MED ORDER — FOLIC ACID 5 MG/ML IJ SOLN: 1.0000 mg | Freq: Every day | INTRAMUSCULAR | Status: DC

## 2019-07-07 MED ORDER — CLOPIDOGREL BISULFATE 75 MG PO TABS
75.0000 mg | ORAL_TABLET | Freq: Every day | ORAL | Status: DC
  Administered 2019-07-08 – 2019-08-05 (×29): 75 mg via ORAL
  Filled 2019-07-07 (×29): qty 1

## 2019-07-07 MED ORDER — METOPROLOL TARTRATE 25 MG PO TABS: 12.5000 mg | ORAL_TABLET | Freq: Two times a day (BID) | ORAL | Status: DC

## 2019-07-07 MED ORDER — PANTOPRAZOLE SODIUM 40 MG PO TBEC
40.0000 mg | DELAYED_RELEASE_TABLET | Freq: Every day | ORAL | Status: DC
  Administered 2019-07-08 – 2019-08-05 (×29): 40 mg via ORAL
  Filled 2019-07-07 (×29): qty 1

## 2019-07-07 MED ORDER — CLOPIDOGREL BISULFATE 75 MG PO TABS: 75.0000 mg | ORAL_TABLET | Freq: Every day | ORAL | Status: DC

## 2019-07-07 MED ORDER — ENSURE ENLIVE PO LIQD: 237.0000 mL | Freq: Two times a day (BID) | ORAL | 12 refills | Status: DC

## 2019-07-07 MED ORDER — ACETAMINOPHEN 325 MG PO TABS
650.0000 mg | ORAL_TABLET | ORAL | Status: DC | PRN
  Administered 2019-07-10 – 2019-07-28 (×12): 650 mg via ORAL
  Filled 2019-07-07 (×13): qty 2

## 2019-07-07 MED ORDER — NICOTINE 21 MG/24HR TD PT24: 21.0000 mg | MEDICATED_PATCH | Freq: Every day | TRANSDERMAL | 0 refills | Status: DC

## 2019-07-07 MED ORDER — METOPROLOL TARTRATE 12.5 MG HALF TABLET
12.5000 mg | ORAL_TABLET | Freq: Two times a day (BID) | ORAL | Status: DC
  Administered 2019-07-07 – 2019-07-18 (×22): 12.5 mg via ORAL
  Filled 2019-07-07 (×21): qty 1

## 2019-07-07 MED ORDER — ENOXAPARIN SODIUM 40 MG/0.4ML ~~LOC~~ SOLN: 40.0000 mg | SUBCUTANEOUS | Status: DC

## 2019-07-07 MED ORDER — ENSURE ENLIVE PO LIQD
237.0000 mL | Freq: Two times a day (BID) | ORAL | Status: DC
  Administered 2019-07-07 – 2019-08-04 (×53): 237 mL via ORAL

## 2019-07-07 MED ORDER — ACETAMINOPHEN 650 MG RE SUPP: 650.0000 mg | RECTAL | Status: DC | PRN

## 2019-07-07 MED ORDER — NICOTINE 21 MG/24HR TD PT24
21.0000 mg | MEDICATED_PATCH | Freq: Every day | TRANSDERMAL | Status: DC
  Administered 2019-07-08 – 2019-08-02 (×26): 21 mg via TRANSDERMAL
  Filled 2019-07-07 (×26): qty 1

## 2019-07-07 MED ORDER — SORBITOL 70 % SOLN
30.0000 mL | Freq: Every day | Status: DC | PRN
  Administered 2019-07-27: 30 mL via ORAL
  Filled 2019-07-07 (×3): qty 30

## 2019-07-07 MED ORDER — THIAMINE HCL 100 MG PO TABS: 100.0000 mg | ORAL_TABLET | Freq: Every day | ORAL | Status: DC

## 2019-07-07 MED ORDER — ATORVASTATIN CALCIUM 40 MG PO TABS
40.0000 mg | ORAL_TABLET | Freq: Every day | ORAL | Status: DC
  Administered 2019-07-08 – 2019-08-05 (×29): 40 mg via ORAL
  Filled 2019-07-07 (×29): qty 1

## 2019-07-07 MED ORDER — SENNOSIDES-DOCUSATE SODIUM 8.6-50 MG PO TABS: 1.0000 | ORAL_TABLET | Freq: Every evening | ORAL | Status: DC | PRN

## 2019-07-07 MED ORDER — ACETAMINOPHEN 160 MG/5ML PO SOLN
650.0000 mg | ORAL | Status: DC | PRN
  Administered 2019-07-22: 650 mg
  Filled 2019-07-07: qty 20.3

## 2019-07-07 MED ORDER — FOLIC ACID 1 MG PO TABS
1.0000 mg | ORAL_TABLET | Freq: Every day | ORAL | Status: DC
  Administered 2019-07-08 – 2019-08-05 (×29): 1 mg via ORAL
  Filled 2019-07-07 (×30): qty 1

## 2019-07-07 NOTE — Plan of Care (Signed)
  Problem: Consults Goal: RH STROKE PATIENT EDUCATION Description: See Patient Education module for education specifics  Outcome: Progressing Goal: Nutrition Consult-if indicated Outcome: Progressing   Problem: RH SKIN INTEGRITY Goal: RH STG SKIN FREE OF INFECTION/BREAKDOWN Outcome: Progressing Goal: RH STG MAINTAIN SKIN INTEGRITY WITH ASSISTANCE Description: STG Maintain Skin Integrity With Assistance. Outcome: Progressing   Problem: RH SAFETY Goal: RH STG ADHERE TO SAFETY PRECAUTIONS W/ASSISTANCE/DEVICE Description: STG Adhere to Safety Precautions With Assistance/Device. Outcome: Progressing Goal: RH STG DECREASED RISK OF FALL WITH ASSISTANCE Description: STG Decreased Risk of Fall With Assistance. Outcome: Progressing   Problem: RH KNOWLEDGE DEFICIT Goal: RH STG INCREASE KNOWLEDGE OF HYPERTENSION Outcome: Progressing Goal: RH STG INCREASE KNOWLEDGE OF DYSPHAGIA/FLUID INTAKE Outcome: Progressing Goal: RH STG INCREASE KNOWLEGDE OF HYPERLIPIDEMIA Outcome: Progressing Goal: RH STG INCREASE KNOWLEDGE OF STROKE PROPHYLAXIS Outcome: Progressing

## 2019-07-07 NOTE — IPOC Note (Signed)
Individualized overall Plan of Care [facility]) Patient Details Name: Russell Russell MRN: [id number] DOB: 06-Apr-1950  Admitting Diagnosis: Cerebrovascular accident (CVA) of right basal ganglia [facility])  Hospital Problems: Principal Problem:   Cerebrovascular accident (CVA) of right basal ganglia ([place]) Active Problems:   Essential hypertension   Tobacco abuse   Alcohol abuse   Leukopenia   Benign essential HTN   Hypoalbuminemia     Functional Problem List: Nursing Motor, Safety  PT Balance, Endurance, Motor, Perception, Safety  OT Balance, Cognition, Edema, Endurance, Motor, Pain, Perception, Safety, Sensory  SLP Cognition, Nutrition  TR         Basic ADL's: OT Grooming, Bathing, Dressing, Toileting     Advanced  ADL's: OT       Transfers: PT Bed Mobility, Bed to Chair, Car, Manufacturing systems engineer, Metallurgist: PT Ambulation, Emergency planning/management officer, Stairs     Additional Impairments: OT Fuctional Use of Upper Extremity  SLP Swallowing, Communication, Social Cognition expression Problem Solving, Awareness, Memory  TR      Anticipated Outcomes Item Anticipated Outcome  Self Feeding supervision  Swallowing  Supervision   Basic self-care  min A  Toileting  min  A   Bathroom Transfers min A  Bowel/Bladder     Transfers  min A with LRAD  Locomotion  min A with LRAD  Communication  Mod I  Cognition  Supervision  Pain     Safety/Judgment  Patient will remain free of injury and will utilize fall precautions to safely transfer and prevent falls/injury.   Therapy Plan: PT Intensity: Minimum of 1-2 x/day ,45 to 90 minutes PT Frequency: 5 out of 7 days PT Duration Estimated Length of Stay: 3-4 weeks OT Intensity: Minimum of 1-2 x/day, 45 to 90 minutes OT Frequency: 5 out of 7 days OT Duration/Estimated Length of Stay: ~3.5-4 weeks SLP Intensity: Minumum of 1-2 x/day, 30 to 90 minutes SLP Frequency: 3 to 5 out of 7 days SLP Duration/Estimated  Length of Stay: 3-4 weeks    Team Interventions: Nursing Interventions Patient/Family Education  PT interventions Ambulation/gait training, Discharge planning, Functional mobility training, Psychosocial support, Therapeutic Activities, Balance/vestibular training, Disease management/prevention, Neuromuscular re-education, Therapeutic Exercise, Wheelchair propulsion/positioning, Cognitive remediation/compensation, DME/adaptive equipment instruction, Splinting/orthotics, UE/LE Strength taining/ROM, Pain management, Community reintegration, Technical sales engineer stimulation, Patient/family education, IT trainer, UE/LE Coordination activities  OT Interventions Training and development officer, Discharge planning, Pain management, Self Care/advanced ADL retraining, Therapeutic Activities, UE/LE Coordination activities, Cognitive remediation/compensation, Disease mangement/prevention, Functional mobility training, Patient/family education, Skin care/wound managment, Therapeutic Exercise, Community reintegration, Engineer, drilling, Visual/perceptual remediation/compensation, Neuromuscular re-education, Psychosocial support, UE/LE Strength taining/ROM, Splinting/orthotics, Wheelchair propulsion/positioning  SLP Interventions Cognitive remediation/compensation, Dysphagia/aspiration precaution training, Internal/external aids, Speech/Language facilitation, Therapeutic Activities, Cueing hierarchy, Environmental controls, Functional tasks, Patient/family education  TR Interventions    SW/CM Interventions Discharge Planning, Psychosocial Support, Patient/Family Education   Barriers to Discharge MD  Medical stability, Incontinence and Lack of/limited family support  Nursing      PT Other (comments) L hemiparesis/pushing  OT      SLP      SW       Team Discharge Planning: Destination: PT-Home ,OT- Home , SLP-Home Projected Follow-up: PT-Home health PT, OT-  Outpatient OT, SLP-Home Health  SLP, 24 hour supervision/assistance Projected Equipment Needs: PT-To be determined, OT- To be determined, SLP-None recommended by SLP Equipment Details: PT-has none, OT-  Patient/family involved in discharge planning: PT- Patient, Family member/caregiver,  OT-Patient, Family member/caregiver, SLP-Patient, Family member/caregiver  MD ELOS: 17-20  days. Medical Rehab Prognosis:  Good Assessment: 69 year old right-handed male with history of alcohol/tobacco abuse, hyperlipidemia.  He presented in the late evening of 06/30/2019 with left hemiparesis.  Cranial CT scan unremarkable for acute intracranial process.  Patient did not receive TPA.  CT angiogram head and neck no emergent large vessel occlusion or high-grade stenosis.  MRI showed multiple scattered acute ischemic infarcts involving the posterior right basal ganglia right cerebral hemisphere and right cerebellum.  Echocardiogram with ejection fraction of 60%.  No source of embolus.  Lower extremity Dopplers negative.  Admission chemistries unremarkable, alcohol level 18, SARS coronavirus negative.  Currently maintained on aspirin 325 mg and Plavix for CVA prophylaxis x3 months then aspirin alone.  Patient did undergo loop recorder placement.  He is on a dysphagia #2 thin liquid diet.  Patient with resulting functional deficits with mobility, transfers, endurance, self-care.  Will set goals for Min A with PT/OT and Supervision with SLP.  Due to the current state of emergency, patients may not be receiving their 3-hours of Medicare-mandated therapy.  See Team Conference Notes for weekly updates to the plan of care

## 2019-07-07 NOTE — Progress Notes (Signed)
Inpatient Rehabilitation Medication Review by a Pharmacist  A complete drug regimen review was completed for this patient to identify any potential clinically significant medication issues.  Clinically significant medication issues were identified:  no  Time spent performing this drug regimen review (minutes):  10  Eudelia Bunch, Pharm.D [phone] 07/07/2019 4:18 PM

## 2019-07-07 NOTE — Discharge Instructions (Signed)
Further management per CIR.

## 2019-07-07 NOTE — Discharge Summary (Signed)
Russell Russell, is a 69 y.o. male  DOB 02/07/1951  MRN [id number].  Admission date:  06/30/2019  Admitting Physician  Dwyane Dee, MD  Discharge Date:  07/07/2019   Primary MD  Inda Coke, PA  Recommendations for primary care physician for things to follow:  -Further management per CIR -Patient on dysphagia 2 diet with thin liquid -continue with  dual antiplatelet therapy aspirin and Plavix for 3 months, then aspirin alone. -Follow on JAK2 results, still pending at time of discharge, was obtained given polycythemia.   Admission Diagnosis  Stroke [facility]) [I63.9] Acute ischemic stroke [facility]) [I63.9]   Discharge Diagnosis  Stroke [facility]) [I63.9] Acute ischemic stroke ([place]) [I63.9]    Active Problems:   Stroke ([place])   Acute ischemic stroke ([place])   Alcohol abuse   Dyslipidemia   Dysphagia, post-stroke   Polycythemia      Past Medical History:  Diagnosis Date  . Alcohol abuse    drinks 1 gallon of Gin every weekend, nothing during the week x >15 yrs  . Elevated transaminase level    +elev bili: abd u/s 06/2014 showed stable small hepatic hemangiomas, o/w normal.  . Essential hypertension   . GERD (gastroesophageal reflux disease)   . Hyperlipidemia 03/2014   Atorv started-chol improved  . Impaired fasting glucose 03/2014  . Nephrolithiasis   . PUD (peptic ulcer disease)   . Tobacco dependence    chantix: "psych effects"    Past Surgical History:  Procedure Laterality Date  . COLONOSCOPY  2005   Recall 10 yrs ([place])  . LOOP RECORDER INSERTION N/A 07/03/2019   Procedure: LOOP RECORDER INSERTION;  Surgeon: Thompson Grayer, MD;  Location: [facility];  Service: Cardiovascular;  Laterality: N/A;  . removal of kidney stones  2006   cystoscopic--removed from ureter.  No prob since.       History of present illness and  Hospital Course:     Kindly see H&P for history of  present illness and admission details, please review complete Labs, Consult reports and Test reports for all details in brief  HPI  from the history and physical done on the day of admission 06/30/2019.  HPI: Russell Russell is a 69 year old AA male with PMH HTN, alcohol abuse (ongoing use), HLD, GERD, PUD, tobacco use who presented to the ER after being found down at home in the bathroom.  History is provided by the patient who is able to provide collateral information and full HPI as needed.  His wife has already left the ER and went home. He states that she was at work around 1030 this morning when he was attempting to go to the bathroom and collapsed to the floor.  He did not pass out and does recall all the events of falling.  He also denies hitting his head.  He laid on the ground due to weakness until his wife came home around 5:30 PM.  He notes being extremely weak in his left upper and lower extremity.  His speech has  also been slurred since the episode occurred and he has a very noticeable left facial droop. He was brought to the ER via EMS and underwent work-up for code stroke. Workup was notable for acute stroke in the right frontal lobe and left cerebellum.  He underwent neuro eval in the ER as well and is admitted for further stroke workup and monitoring.   On exam, he has significant weakness involving his left upper and lower extremity.  He also has significant dysarthria but no true aphasia.  Left-sided facial droop is also obvious.  He had no significant drooling at time of my interview. He does endorse drinking approximately 3-4 beers daily and approximately half a pint of gin daily.  His last drink was Monday evening.  He also continues to smoke approximately 0.5 PPD x12 years.    Hospital Course    Acute CVA: -Neurology input greatly appreciated, evidence of multiple scattered anterior and posterior right brain infarct, likely embolic, secondary to unclear source. - CTA head &  neckno ELVO. Aortic atherosclerosis. 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 Subtle R ICA bulb indistinct filling defect possible small carotid web vs less likely thrombus. - CT perfusionR frontal periventricular white matter and L cerebellar artifact/pseudonormalization. No core infarct. - MRIMultiple scattered posterior R basal ganglia, R cerebral, R cerebellar infarcts.Small vessel disease. Atrophy.Multiple old lacunes B basal ganglia, thalami, pons and L cerebellar infarct -2D echo with a preserved EF 55 to 60%, with no source of embolus -Lower extremity venous Doppler with no DVT -Per neurology recommendation, patient had loop recorder inserted by cardiology 07/03/2019. -Currently on aspirin and Plavix 75 mg oral daily, need to continue for 37-month, and then aspirin alone -PT/OT> CIR  -SLP consulted: Dysphagia 2   Hypertension/hypertensive urgency -Initially allowing for permissive hypertension -Blood pressure has been controlled gradually, currently he is on low-dose metoprolol given some mild tachycardia as well .  Hyperlipidemia -Should be on Lipitor 40 mg oral daily, but he is noncompliant, LDL is 109, goal is less than 70.  Patient will right carotid filling defect -Web versus thrombus, will continue dual antiplatelet therapy for 3 months, then repeat CTA neck and CUS for further evaluation  Alcohol abuse -On folic acid, B1 and multivitamin, on CIWA protocol, no evidence of withdrawals  Polycythemia -Likely secondary to tobacco abuse, follow-up JAK2 mutation. - Polycythemia HCT 19.2-20.1-18.0 - smoker  Tobacco abuse -He was counseled, patient requesting nicotine patch which has been provided    Discharge Condition: Stable   Follow UP  Follow-up Information    Garvin Fila, MD. Schedule an appointment as soon as possible for a visit in 4 week(s).   Specialties: Neurology, Radiology Contact information: [address] [place]  [address] [phone]             Discharge Instructions  and  Discharge Medications    Discharge Instructions    Ambulatory referral to Neurology   Complete by: As directed    Follow up with Dr. Leonie Man at [facility] in 4-6 weeks. Too complicated for NP to follow. Thanks.   Discharge instructions   Complete by: As directed    Management Per CIR   Increase activity slowly   Complete by: As directed      Allergies as of 07/07/2019      Reactions   Penicillins Rash      Medication List    STOP taking these medications   amLODipine 10 MG tablet Commonly known as: NORVASC     TAKE  these medications   aspirin 325 MG tablet Take 1 tablet (325 mg total) by mouth daily. Start taking on: July 08, 2019   atorvastatin 40 MG tablet Commonly known as: LIPITOR Take 1 tablet (40 mg total) by mouth daily.   clopidogrel 75 MG tablet Commonly known as: PLAVIX Take 1 tablet (75 mg total) by mouth daily. Continue for 3 months then stop. Start taking on: Aug 06, 2019   feeding supplement (ENSURE ENLIVE) Liqd Take 237 mLs by mouth 2 (two) times daily between meals.   folic acid 1 MG tablet Commonly known as: FOLVITE Take 1 tablet (1 mg total) by mouth daily.   halobetasol 0.05 % cream Commonly known as: ULTRAVATE Apply topically 2 (two) times daily.   metoprolol tartrate 25 MG tablet Commonly known as: LOPRESSOR Take 0.5 tablets (12.5 mg total) by mouth 2 (two) times daily.   nicotine 21 mg/24hr patch Commonly known as: NICODERM CQ - dosed in mg/24 hours Place 1 patch (21 mg total) onto the skin daily. Start taking on: July 08, 2019   pantoprazole 40 MG tablet Commonly known as: PROTONIX Take 1 tablet (40 mg total) by mouth daily. Start taking on: July 08, 2019   thiamine 100 MG tablet Take 1 tablet (100 mg total) by mouth daily. Start taking on: July 08, 2019         Diet and Activity recommendation: See Discharge Instructions above   Consults obtained  - -neurology -Patient rehab   Major procedures and Radiology Reports - PLEASE review detailed and final reports for all details, in brief -   Loop recorder inserted 4/23   CT Code Stroke CTA Head W/WO contrast  Addendum Date: 07/01/2019   ADDENDUM REPORT: 07/01/2019 09:49 ADDENDUM: Study discussed by telephone with Dr. Erlinda Hong on 07/01/2019 at 0930 hours. There is subtle heterogeneity at the lateral right ICA bulb (series 4, image 108) suggesting a small, indistinct filling defect in the vessel. Perhaps this is a small carotid web, or less likely adherent thrombus, with some flow related motion. We discussed that I think Carotid Doppler Ultrasound focused on the Right ICA bulb would best evaluate these possibilities. Electronically Signed   By: Genevie Ann M.D.   On: 07/01/2019 09:49   Result Date: 07/01/2019 CLINICAL DATA:  Left-sided weakness EXAM: CT ANGIOGRAPHY HEAD AND NECK CT PERFUSION BRAIN TECHNIQUE: Multidetector CT imaging of the head and neck was performed using the standard protocol during bolus administration of intravenous contrast. Multiplanar CT image reconstructions and MIPs were obtained to evaluate the vascular anatomy. Carotid stenosis measurements (when applicable) are obtained utilizing NASCET criteria, using the distal internal carotid diameter as the denominator. Multiphase CT imaging of the brain was performed following IV bolus contrast injection. Subsequent parametric perfusion maps were calculated using RAPID software. CONTRAST:  171mL OMNIPAQUE IOHEXOL 350 MG/ML SOLN COMPARISON:  None. FINDINGS: CTA NECK FINDINGS SKELETON: There is no bony spinal canal stenosis. No lytic or blastic lesion. OTHER NECK: Normal pharynx, larynx and major salivary glands. No cervical lymphadenopathy. Unremarkable thyroid gland. UPPER CHEST: No pneumothorax or pleural effusion. No nodules or masses. AORTIC ARCH: There is mild calcific atherosclerosis of the aortic arch. 3.6 cm diameter of descending  thoracic aorta arch. Conventional 3 vessel aortic branching pattern. The visualized proximal subclavian arteries are widely patent. RIGHT CAROTID SYSTEM: Normal without aneurysm, dissection or stenosis. LEFT CAROTID SYSTEM: Normal without aneurysm, dissection or stenosis. VERTEBRAL ARTERIES: Right dominant configuration. Both origins are clearly patent. There is no dissection, occlusion or  flow-limiting stenosis to the skull base (V1-V3 segments). CTA HEAD FINDINGS POSTERIOR CIRCULATION: --Vertebral arteries: Normal V4 segments. --Posterior inferior cerebellar arteries (PICA): Patent origins from the vertebral arteries. --Anterior inferior cerebellar arteries (AICA): Patent origins from the basilar artery. --Basilar artery: Normal. --Superior cerebellar arteries: Normal. --Posterior cerebral arteries: Normal. Both originate from the basilar artery. Posterior communicating arteries (p-comm) are diminutive or absent. ANTERIOR CIRCULATION: --Intracranial internal carotid arteries: Normal. --Anterior cerebral arteries (ACA): Normal. Both A1 segments are present. Patent anterior communicating artery (a-comm). --Middle cerebral arteries (MCA): Normal. VENOUS SINUSES: As permitted by contrast timing, patent. ANATOMIC VARIANTS: None Review of the MIP images confirms the above findings. CT Brain Perfusion Findings: ASPECTS: 10 CBF (<30%) Volume: 57mL Perfusion (Tmax>6.0s) volume: 47mL Mismatch Volume: 4mL Infarction Location:Areas of elevated Tmax are located in the right frontal periventricular white matter and left cerebellum. These regions correspond to areas of hypoattenuation on the noncontrast head CT, including an old left cerebellar infarct. IMPRESSION: 1. No emergent large vessel occlusion or high-grade stenosis of the intracranial arteries. 2. Area of ischemia identified in the right frontal periventricular white matter and left cerebellum, corresponding to the areas of hypoattenuation on the noncontrast head CT  and possibly artifact/pseudonormalization. No core infarct by CT perfusion analysis. 3. Aortic Atherosclerosis (ICD10-I70.0). 4. 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 Recommend annual imaging followup by CTA or MRA. This recommendation follows 2010 ACCF/AHA/AATS/ACR/ASA/SCA/SCAI/SIR/STS/SVM Guidelines for the Diagnosis and Management of Patients with Thoracic Aortic Disease. Circulation.2010; [id number]:[id number]. Aortic aneurysm NOS (ICD10-I71.9) Electronically Signed: By: Ulyses Jarred M.D. On: 06/30/2019 20:03   CT Code Stroke CTA Neck W/WO contrast  Addendum Date: 07/01/2019   ADDENDUM REPORT: 07/01/2019 09:49 ADDENDUM: Study discussed by telephone with Dr. Erlinda Hong on 07/01/2019 at 0930 hours. There is subtle heterogeneity at the lateral right ICA bulb (series 4, image 108) suggesting a small, indistinct filling defect in the vessel. Perhaps this is a small carotid web, or less likely adherent thrombus, with some flow related motion. We discussed that I think Carotid Doppler Ultrasound focused on the Right ICA bulb would best evaluate these possibilities. Electronically Signed   By: Genevie Ann M.D.   On: 07/01/2019 09:49   Result Date: 07/01/2019 CLINICAL DATA:  Left-sided weakness EXAM: CT ANGIOGRAPHY HEAD AND NECK CT PERFUSION BRAIN TECHNIQUE: Multidetector CT imaging of the head and neck was performed using the standard protocol during bolus administration of intravenous contrast. Multiplanar CT image reconstructions and MIPs were obtained to evaluate the vascular anatomy. Carotid stenosis measurements (when applicable) are obtained utilizing NASCET criteria, using the distal internal carotid diameter as the denominator. Multiphase CT imaging of the brain was performed following IV bolus contrast injection. Subsequent parametric perfusion maps were calculated using RAPID software. CONTRAST:  166mL OMNIPAQUE IOHEXOL 350 MG/ML SOLN COMPARISON:  None. FINDINGS: CTA NECK FINDINGS SKELETON: There is no bony spinal canal stenosis.  No lytic or blastic lesion. OTHER NECK: Normal pharynx, larynx and major salivary glands. No cervical lymphadenopathy. Unremarkable thyroid gland. UPPER CHEST: No pneumothorax or pleural effusion. No nodules or masses. AORTIC ARCH: There is mild calcific atherosclerosis of the aortic arch. 3.6 cm diameter of descending thoracic aorta arch. Conventional 3 vessel aortic branching pattern. The visualized proximal subclavian arteries are widely patent. RIGHT CAROTID SYSTEM: Normal without aneurysm, dissection or stenosis. LEFT CAROTID SYSTEM: Normal without aneurysm, dissection or stenosis. VERTEBRAL ARTERIES: Right dominant configuration. Both origins are clearly patent. There is no dissection, occlusion or flow-limiting stenosis to the skull base (V1-V3 segments). CTA HEAD FINDINGS POSTERIOR  CIRCULATION: --Vertebral arteries: Normal V4 segments. --Posterior inferior cerebellar arteries (PICA): Patent origins from the vertebral arteries. --Anterior inferior cerebellar arteries (AICA): Patent origins from the basilar artery. --Basilar artery: Normal. --Superior cerebellar arteries: Normal. --Posterior cerebral arteries: Normal. Both originate from the basilar artery. Posterior communicating arteries (p-comm) are diminutive or absent. ANTERIOR CIRCULATION: --Intracranial internal carotid arteries: Normal. --Anterior cerebral arteries (ACA): Normal. Both A1 segments are present. Patent anterior communicating artery (a-comm). --Middle cerebral arteries (MCA): Normal. VENOUS SINUSES: As permitted by contrast timing, patent. ANATOMIC VARIANTS: None Review of the MIP images confirms the above findings. CT Brain Perfusion Findings: ASPECTS: 10 CBF (<30%) Volume: 11mL Perfusion (Tmax>6.0s) volume: 83mL Mismatch Volume: 25mL Infarction Location:Areas of elevated Tmax are located in the right frontal periventricular white matter and left cerebellum. These regions correspond to areas of hypoattenuation on the noncontrast head CT,  including an old left cerebellar infarct. IMPRESSION: 1. No emergent large vessel occlusion or high-grade stenosis of the intracranial arteries. 2. Area of ischemia identified in the right frontal periventricular white matter and left cerebellum, corresponding to the areas of hypoattenuation on the noncontrast head CT and possibly artifact/pseudonormalization. No core infarct by CT perfusion analysis. 3. Aortic Atherosclerosis (ICD10-I70.0). 4. 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 Recommend annual imaging followup by CTA or MRA. This recommendation follows 2010 ACCF/AHA/AATS/ACR/ASA/SCA/SCAI/SIR/STS/SVM Guidelines for the Diagnosis and Management of Patients with Thoracic Aortic Disease. Circulation.2010; [id number]. Aortic aneurysm NOS (ICD10-I71.9) Electronically Signed: By: Ulyses Jarred M.D. On: 06/30/2019 20:03   MR BRAIN WO CONTRAST  Result Date: 07/01/2019 CLINICAL DATA:  Follow-up examination for acute stroke. EXAM: MRI HEAD WITHOUT CONTRAST TECHNIQUE: Multiplanar, multiecho pulse sequences of the brain and surrounding structures were obtained without intravenous contrast. COMPARISON:  Prior CTs from 06/30/2019. FINDINGS: Brain: Examination degraded by motion artifact. Generalized age-related cerebral atrophy. Extensive patchy and confluent T2/FLAIR hyperintensity within the periventricular deep white matter both cerebral hemispheres as well as the pons, most consistent with chronic small vessel ischemic disease, fairly advanced in nature. Multiple superimposed remote lacunar infarcts seen about the bilateral basal ganglia, thalami, and pons. Chronic left cerebellar infarct noted as well. Approximate 2.5 cm linear focus of restricted diffusion seen extending from the posterior right lentiform nucleus into the posterior right caudate and corona radiata, consistent with an acute ischemic perforator type infarct (series 5, image 73). Few additional scattered ischemic infarcts seen involving the subcortical  white matter of the overlying right frontal lobe (w series 5, image 83, 81). Additional subcentimeter acute ischemic infarct noted within the subcortical right occipital lobe (series 5, image 63). Approximate 1 cm acute ischemic infarcts seen involving the inferior right cerebellum (series 5, image 53). No definite associated hemorrhage about these infarcts. No associated mass effect. No mass lesion or midline shift. Mild ventricular prominence related to global parenchymal volume loss without hydrocephalus. No extra-axial fluid collection. Pituitary gland suprasellar region within normal limits. Midline structures intact. Vascular: Major intracranial vascular flow voids are maintained. Skull and upper cervical spine: Craniocervical junction within normal limits. Bone marrow signal intensity normal. No scalp soft tissue abnormality. Sinuses/Orbits: Globes and orbital soft tissues within normal limits. Mild scattered mucosal thickening noted throughout the paranasal sinuses. No mastoid effusion. Inner ear structures normal. Other: None. IMPRESSION: 1. Multiple scattered acute ischemic infarcts involving the posterior right basal ganglia, right cerebral hemisphere, and right cerebellum as above. No associated hemorrhage or mass effect. 2. Underlying atrophy with advanced chronic microvascular ischemic disease, with multiple remote lacunar infarcts about the bilateral basal ganglia, thalami,  and pons, with additional chronic left cerebellar infarct. Electronically Signed   By: Jeannine Boga M.D.   On: 07/01/2019 01:29   EP PPM/ICD IMPLANT  Result Date: 07/03/2019 SURGEON:  Thompson Grayer, MD   PREPROCEDURE DIAGNOSIS:  Cryptogenic Stroke   POSTPROCEDURE DIAGNOSIS:  Cryptogenic Stroke    PROCEDURES:  1. Implantable loop recorder implantation   INTRODUCTION:  Russell Russell is a 69 y.o. male with a history of unexplained stroke who presents today for implantable loop implantation.  The patient has had a  cryptogenic stroke.  Despite an extensive workup by neurology, no reversible causes have been identified.  he has worn telemetry during which he did not have arrhythmias.  There is significant concern for possible atrial fibrillation as the cause for the patients stroke.  The patient therefore presents today for implantable loop implantation.   DESCRIPTION OF PROCEDURE:  Informed written consent was obtained.  The patient required no sedation for the procedure today.  The patients left chest was prepped and draped. Mapping over the patient's chest was performed to identify the appropriate ILR site.  This area was found to be the left parasternal region over the 3rd-4th intercostal space.  The skin overlying this region was infiltrated with lidocaine for local analgesia.  A 0.5-cm incision was made at the implant site.  A subcutaneous ILR pocket was fashioned using a combination of sharp and blunt dissection.  A Medtronic Reveal Linq model [id number] implantable loop recorder was then placed into the pocket R waves were very prominent and measured > 0.2 mV. EBL<1 ml.  Steri- Strips and a sterile dressing were then applied.  There were no early apparent complications.   CONCLUSIONS:  1. Successful implantation of a Medtronic Reveal LINQ implantable loop recorder for cryptogenic stroke  2. No early apparent complications. Thompson Grayer MD, [facility] 07/03/2019 12:57 PM   CT Code Stroke Cerebral Perfusion with contrast  Addendum Date: 07/01/2019   ADDENDUM REPORT: 07/01/2019 09:49 ADDENDUM: Study discussed by telephone with Dr. Erlinda Hong on 07/01/2019 at 0930 hours. There is subtle heterogeneity at the lateral right ICA bulb (series 4, image 108) suggesting a small, indistinct filling defect in the vessel. Perhaps this is a small carotid web, or less likely adherent thrombus, with some flow related motion. We discussed that I think Carotid Doppler Ultrasound focused on the Right ICA bulb would best evaluate these possibilities.  Electronically Signed   By: Genevie Ann M.D.   On: 07/01/2019 09:49   Result Date: 07/01/2019 CLINICAL DATA:  Left-sided weakness EXAM: CT ANGIOGRAPHY HEAD AND NECK CT PERFUSION BRAIN TECHNIQUE: Multidetector CT imaging of the head and neck was performed using the standard protocol during bolus administration of intravenous contrast. Multiplanar CT image reconstructions and MIPs were obtained to evaluate the vascular anatomy. Carotid stenosis measurements (when applicable) are obtained utilizing NASCET criteria, using the distal internal carotid diameter as the denominator. Multiphase CT imaging of the brain was performed following IV bolus contrast injection. Subsequent parametric perfusion maps were calculated using RAPID software. CONTRAST:  147mL OMNIPAQUE IOHEXOL 350 MG/ML SOLN COMPARISON:  None. FINDINGS: CTA NECK FINDINGS SKELETON: There is no bony spinal canal stenosis. No lytic or blastic lesion. OTHER NECK: Normal pharynx, larynx and major salivary glands. No cervical lymphadenopathy. Unremarkable thyroid gland. UPPER CHEST: No pneumothorax or pleural effusion. No nodules or masses. AORTIC ARCH: There is mild calcific atherosclerosis of the aortic arch. 3.6 cm diameter of descending thoracic aorta arch. Conventional 3 vessel aortic branching pattern. The visualized proximal subclavian  arteries are widely patent. RIGHT CAROTID SYSTEM: Normal without aneurysm, dissection or stenosis. LEFT CAROTID SYSTEM: Normal without aneurysm, dissection or stenosis. VERTEBRAL ARTERIES: Right dominant configuration. Both origins are clearly patent. There is no dissection, occlusion or flow-limiting stenosis to the skull base (V1-V3 segments). CTA HEAD FINDINGS POSTERIOR CIRCULATION: --Vertebral arteries: Normal V4 segments. --Posterior inferior cerebellar arteries (PICA): Patent origins from the vertebral arteries. --Anterior inferior cerebellar arteries (AICA): Patent origins from the basilar artery. --Basilar artery:  Normal. --Superior cerebellar arteries: Normal. --Posterior cerebral arteries: Normal. Both originate from the basilar artery. Posterior communicating arteries (p-comm) are diminutive or absent. ANTERIOR CIRCULATION: --Intracranial internal carotid arteries: Normal. --Anterior cerebral arteries (ACA): Normal. Both A1 segments are present. Patent anterior communicating artery (a-comm). --Middle cerebral arteries (MCA): Normal. VENOUS SINUSES: As permitted by contrast timing, patent. ANATOMIC VARIANTS: None Review of the MIP images confirms the above findings. CT Brain Perfusion Findings: ASPECTS: 10 CBF (<30%) Volume: 56mL Perfusion (Tmax>6.0s) volume: 25mL Mismatch Volume: 59mL Infarction Location:Areas of elevated Tmax are located in the right frontal periventricular white matter and left cerebellum. These regions correspond to areas of hypoattenuation on the noncontrast head CT, including an old left cerebellar infarct. IMPRESSION: 1. No emergent large vessel occlusion or high-grade stenosis of the intracranial arteries. 2. Area of ischemia identified in the right frontal periventricular white matter and left cerebellum, corresponding to the areas of hypoattenuation on the noncontrast head CT and possibly artifact/pseudonormalization. No core infarct by CT perfusion analysis. 3. Aortic Atherosclerosis (ICD10-I70.0). 4. 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 Recommend annual imaging followup by CTA or MRA. This recommendation follows 2010 ACCF/AHA/AATS/ACR/ASA/SCA/SCAI/SIR/STS/SVM Guidelines for the Diagnosis and Management of Patients with Thoracic Aortic Disease. Circulation.2010; [id number]. Aortic aneurysm NOS (ICD10-I71.9) Electronically Signed: By: Ulyses Jarred M.D. On: 06/30/2019 20:03   ECHOCARDIOGRAM COMPLETE BUBBLE STUDY  Result Date: 07/01/2019    ECHOCARDIOGRAM REPORT   Patient Name:   Russell Russell Date of Exam: 07/01/2019 Medical Rec #:  [id number]      Height:       70.0 in Accession #:    [id number]      Weight:       151.0 lb Date of Birth:  1951/01/17     BSA:          1.852 m Patient Age:    27 years       BP:           161/130 mmHg Patient Gender: M              HR:           83 bpm. Exam Location:  Inpatient Procedure: 2D Echo, Color Doppler, Cardiac Doppler and Saline Contrast Bubble            Study Indications:    Stroke i163.9  History:        Patient has no prior history of Echocardiogram examinations.                 Risk Factors:Hypertension and Dyslipidemia.  Sonographer:    Raquel Sarna Senior RDCS Referring Phys: [place]  Sonographer Comments: Technically difficult study due to poor echo windows, suboptimal parasternal window and suboptimal apical window. Technically difficult due to lung interference and respiratory variation. Patient coughing and moving thoughout exam. No parasternal, apical, or suprasternal window. IMPRESSIONS  1. Very poor study. All views basically obtained from the subcostal views.  2. Left ventricular ejection fraction, by estimation, is 55 to 60%. The left ventricle has normal  function. The left ventricle has no regional wall motion abnormalities. Left ventricular diastolic function could not be evaluated.  3. Right ventricular systolic function is normal. The right ventricular size is normal.  4. The mitral valve is grossly normal. No evidence of mitral valve regurgitation. No evidence of mitral stenosis.  5. The aortic valve is grossly normal. Aortic valve regurgitation is not visualized. No aortic stenosis is present.  6. The inferior vena cava is normal in size with greater than 50% respiratory variability, suggesting right atrial pressure of 3 mmHg.  7. Agitated saline contrast bubble study was negative, with no evidence of any interatrial shunt. Conclusion(s)/Recommendation(s): No intracardiac source of embolism detected on this transthoracic study. A transesophageal echocardiogram is recommended to exclude cardiac source of embolism if clinically indicated.  FINDINGS  Left Ventricle: Left ventricular ejection fraction, by estimation, is 55 to 60%. The left ventricle has normal function. The left ventricle has no regional wall motion abnormalities. The left ventricular internal cavity size was normal in size. There is  no left ventricular hypertrophy. Left ventricular diastolic function could not be evaluated due to nondiagnostic images. Left ventricular diastolic function could not be evaluated. Right Ventricle: The right ventricular size is normal. No increase in right ventricular wall thickness. Right ventricular systolic function is normal. Left Atrium: Left atrial size was normal in size. Right Atrium: Right atrial size was normal in size. Pericardium: Trivial pericardial effusion is present. Mitral Valve: The mitral valve is grossly normal. No evidence of mitral valve regurgitation. No evidence of mitral valve stenosis. Tricuspid Valve: The tricuspid valve is grossly normal. Tricuspid valve regurgitation is trivial. No evidence of tricuspid stenosis. Aortic Valve: The aortic valve is grossly normal. Aortic valve regurgitation is not visualized. No aortic stenosis is present. Pulmonic Valve: The pulmonic valve was grossly normal. Pulmonic valve regurgitation is not visualized. No evidence of pulmonic stenosis. Aorta: The aortic root is normal in size and structure. Venous: The inferior vena cava is normal in size with greater than 50% respiratory variability, suggesting right atrial pressure of 3 mmHg. IAS/Shunts: No atrial level shunt detected by color flow Doppler. Agitated saline contrast was given intravenously to evaluate for intracardiac shunting. Agitated saline contrast bubble study was negative, with no evidence of any interatrial shunt.  LEFT VENTRICLE PLAX 2D LVIDd:         3.80 cm  Diastology LVIDs:         2.50 cm  LV e' lateral: 6.20 cm/s LV PW:         1.10 cm  LV e' medial:  4.57 cm/s LV IVS:        1.00 cm LVOT diam:     2.10 cm LV SV:         48 LV  SV Index:   26 LVOT Area:     3.46 cm  RIGHT VENTRICLE RV S prime:     9.46 cm/s TAPSE (M-mode): 1.8 cm LEFT ATRIUM           Index       RIGHT ATRIUM           Index LA diam:      3.30 cm 1.78 cm/m  RA Area:     19.00 cm LA Vol (A4C): 50.4 ml 27.18 ml/m RA Volume:   57.90 ml  31.26 ml/m  AORTIC VALVE LVOT Vmax:   64.00 cm/s LVOT Vmean:  50.200 cm/s LVOT VTI:    0.140 m  AORTA Ao Root diam: 3.60 cm  SHUNTS Systemic VTI:  0.14 m Systemic Diam: 2.10 cm Eleonore Chiquito MD Electronically signed by Eleonore Chiquito MD Signature Date/Time: 07/01/2019/3:25:46 PM    Final    CT HEAD CODE STROKE WO CONTRAST  Result Date: 06/30/2019 CLINICAL DATA:  Code stroke.  Left-sided weakness EXAM: CT HEAD WITHOUT CONTRAST TECHNIQUE: Contiguous axial images were obtained from the base of the skull through the vertex without intravenous contrast. COMPARISON:  None. FINDINGS: Brain: There is no mass, hemorrhage or extra-axial collection. There is generalized atrophy without lobar predilection. There is hypoattenuation of the periventricular white matter, most commonly indicating chronic ischemic microangiopathy. Old left basal ganglia small vessel infarct. Vascular: No abnormal hyperdensity of the major intracranial arteries or dural venous sinuses. No intracranial atherosclerosis. Skull: The visualized skull base, calvarium and extracranial soft tissues are normal. Sinuses/Orbits: No fluid levels or advanced mucosal thickening of the visualized paranasal sinuses. No mastoid or middle ear effusion. The orbits are normal. ASPECTS [facility] Stroke Program Early CT Score) - Ganglionic level infarction (caudate, lentiform nuclei, internal capsule, insula, M1-M3 cortex): 7 - Supraganglionic infarction (M4-M6 cortex): 3 Total score (0-10 with 10 being normal): 10 IMPRESSION: 1. Chronic ischemic microangiopathy and generalized atrophy without acute intracranial abnormality. 2. ASPECTS is 10. * These results were communicated to Dr. Kerney Elbe at 7:30 pm on 06/30/2019 by text page via the [facility] messaging system. Electronically Signed   By: Ulyses Jarred M.D.   On: 06/30/2019 19:30   VAS US CAROTID  Result Date: 07/02/2019 Carotid Arterial Duplex Study Indications:       CVA and follow up possible carotid web versus thrombus seen                    on CT. Comparison Study:  No prior study Performing Technologist: Maudry Mayhew MHA, RDMS, RVT, RDCS  Examination Guidelines: A complete evaluation includes B-mode imaging, spectral Doppler, color Doppler, and power Doppler as needed of all accessible portions of each vessel. Bilateral testing is considered an integral part of a complete examination. Limited examinations for reoccurring indications may be performed as noted.  Right Carotid Findings: +----------+--------+--------+--------+------------------+--------+           PSV cm/sEDV cm/sStenosisPlaque DescriptionComments +----------+--------+--------+--------+------------------+--------+ CCA Prox  51      14                                         +----------+--------+--------+--------+------------------+--------+ CCA Distal44      16                                         +----------+--------+--------+--------+------------------+--------+ ICA Prox  17      4                                          +----------+--------+--------+--------+------------------+--------+ ICA Distal59      26                                         +----------+--------+--------+--------+------------------+--------+ ECA       44      14                                         +----------+--------+--------+--------+------------------+--------+  Summary: Right Carotid: Velocities in the right ICA are consistent with a 1-39% stenosis.                There is a heterogenous mobile area of the anterior vessel wall                at the carotid bulb/proximal ICA (Images 16-19). Cannot                differentiate between  possible carotid web versus thrombus versus                unknown etiology.  *See table(s) above for measurements and observations.  Electronically signed by Antony Contras MD on 07/02/2019 at 8:44:11 AM.   Final    VAS [place] LOWER EXTREMITY VENOUS (DVT)  Result Date: 07/02/2019  Lower Venous DVTStudy Indications: Stroke.  Risk Factors: None identified. Comparison Study: No prior studies. Performing Technologist: Oliver Hum RVT  Examination Guidelines: A complete evaluation includes B-mode imaging, spectral Doppler, color Doppler, and power Doppler as needed of all accessible portions of each vessel. Bilateral testing is considered an integral part of a complete examination. Limited examinations for reoccurring indications may be performed as noted. The reflux portion of the exam is performed with the patient in reverse Trendelenburg.  +---------+---------------+---------+-----------+----------+--------------+ RIGHT    CompressibilityPhasicitySpontaneityPropertiesThrombus Aging +---------+---------------+---------+-----------+----------+--------------+ CFV      Full           Yes      Yes                                 +---------+---------------+---------+-----------+----------+--------------+ SFJ      Full                                                        +---------+---------------+---------+-----------+----------+--------------+ FV Prox  Full                                                        +---------+---------------+---------+-----------+----------+--------------+ FV Mid   Full                                                        +---------+---------------+---------+-----------+----------+--------------+ FV DistalFull                                                        +---------+---------------+---------+-----------+----------+--------------+ PFV      Full                                                         +---------+---------------+---------+-----------+----------+--------------+ POP      Full  Yes      Yes                                 +---------+---------------+---------+-----------+----------+--------------+ PTV      Full                                                        +---------+---------------+---------+-----------+----------+--------------+ PERO     Full                                                        +---------+---------------+---------+-----------+----------+--------------+   +---------+---------------+---------+-----------+----------+--------------+ LEFT     CompressibilityPhasicitySpontaneityPropertiesThrombus Aging +---------+---------------+---------+-----------+----------+--------------+ CFV      Full           Yes      Yes                                 +---------+---------------+---------+-----------+----------+--------------+ SFJ      Full                                                        +---------+---------------+---------+-----------+----------+--------------+ FV Prox  Full                                                        +---------+---------------+---------+-----------+----------+--------------+ FV Mid   Full                                                        +---------+---------------+---------+-----------+----------+--------------+ FV DistalFull                                                        +---------+---------------+---------+-----------+----------+--------------+ PFV      Full                                                        +---------+---------------+---------+-----------+----------+--------------+ POP      Full           Yes      Yes                                 +---------+---------------+---------+-----------+----------+--------------+ PTV  Full                                                         +---------+---------------+---------+-----------+----------+--------------+ PERO     Full                                                        +---------+---------------+---------+-----------+----------+--------------+     Summary: RIGHT: - There is no evidence of deep vein thrombosis in the lower extremity.  - No cystic structure found in the popliteal fossa.  LEFT: - There is no evidence of deep vein thrombosis in the lower extremity.  - No cystic structure found in the popliteal fossa.  *See table(s) above for measurements and observations. Electronically signed by Monica Martinez MD on 07/02/2019 at 8:09:03 PM.    Final     Micro Results     Recent Results (from the past 240 hour(s))  SARS CORONAVIRUS 2 (TAT 6-24 HRS) Nasopharyngeal Nasopharyngeal Swab     Status: None   Collection Time: 06/30/19  9:58 PM   Specimen: Nasopharyngeal Swab  Result Value Ref Range Status   SARS Coronavirus 2 NEGATIVE NEGATIVE Final    Comment: (NOTE) SARS-CoV-2 target nucleic acids are NOT DETECTED. The SARS-CoV-2 RNA is generally detectable in upper and lower respiratory specimens during the acute phase of infection. Negative results do not preclude SARS-CoV-2 infection, do not rule out co-infections with other pathogens, and should not be used as the sole basis for treatment or other patient management decisions. Negative results must be combined with clinical observations, patient history, and epidemiological information. The expected result is Negative. Fact Sheet for Patients: SugarRoll.be Fact Sheet for Healthcare Providers: https://www.woods-mathews.com/ This test is not yet approved or cleared by the [place] FDA and  has been authorized for detection and/or diagnosis of SARS-CoV-2 by FDA under an Emergency Use Authorization (EUA). This EUA will remain  in effect (meaning this test can be used) for the duration of the COVID-19 declaration  under Section 56 4(b)(1) of the Act, 21 U.S.C. section 360bbb-3(b)(1), unless the authorization is terminated or revoked sooner. Performed at [facility], [place] [address], [place], [place] [address]        Today   Subjective:   Russell Russell today has no headache,no chest or abdominal pain, ports left lower extremity weakness continues to improve.    Objective:   Blood pressure (!) 137/106, pulse (!) 101, temperature 97.6 F (36.4 C), temperature source Oral, resp. rate 19, height 5\' 10"  (1.778 m), weight 63.2 kg, SpO2 94 %.   Intake/Output Summary (Last 24 hours) at 07/07/2019 1137 Last data filed at 07/07/2019 0811 Gross per 24 hour  Intake 240 ml  Output 400 ml  Net -160 ml    Exam Awake Alert, Oriented x 3, No new F.N deficits, Normal affect Symmetrical Chest wall movement, Good air movement bilaterally, CTAB RRR,No Gallops,Rubs or new Murmurs, No Parasternal Heave +ve B.Sounds, Abd Soft, Non tender, No rebound -guarding or rigidity. No Cyanosis, Clubbing or edema, No new Rash or bruise  Data Review   CBC w Diff:  Lab Results  Component Value Date  WBC 4.5 07/07/2019   HGB 17.5 (H) 07/07/2019   HCT 50.6 07/07/2019   PLT 245 07/07/2019   LYMPHOPCT 16 06/30/2019   MONOPCT 10 06/30/2019   EOSPCT 0 06/30/2019   BASOPCT 1 06/30/2019    CMP:  Lab Results  Component Value Date   NA 137 07/07/2019   K 3.9 07/07/2019   CL 102 07/07/2019   CO2 25 07/07/2019   BUN 20 07/07/2019   CREATININE 1.02 07/07/2019   PROT 7.3 06/30/2019   ALBUMIN 3.3 (L) 06/30/2019   BILITOT 1.4 (H) 06/30/2019   ALKPHOS 100 06/30/2019   AST 53 (H) 06/30/2019   ALT 29 06/30/2019  .   Total Time in preparing paper work, data evaluation and todays exam - 78 minutes  Phillips Climes M.D on 07/07/2019 at 11:37 AM  [facility]   Office  [phone]

## 2019-07-07 NOTE — H&P (Incomplete)
? ? ?  Physical Medicine and Rehabilitation Admission H&P ? ?  ?Chief Complaint  ?Patient presents with  ?? Code Stroke  ?: ?HPI: Russell Russell is a 69 year old right-handed male with history of alcohol/tobacco abuse, hyperlipidemia.  Per chart review and wife due to memory difficulties patient lives with spouse currently working but planning on retiring in June 2021.  1 level home one-step to entry.  Reportedly independent prior to admission.  He presented in the late evening of 06/30/2019 with left hemiparesis.  Cranial CT scan unremarkable for acute intracranial process.  Patient did not receive TPA.  CT angiogram head and neck no emergent large vessel occlusion or high-grade stenosis.  MRI showed multiple scattered acute ischemic infarcts involving the posterior right basal ganglia right cerebral hemisphere and right cerebellum.  Echocardiogram with ejection fraction of 60%.  No source of embolus.  Lower extremity Dopplers negative.  Admission chemistries unremarkable, alcohol level 18, SARS coronavirus negative.  Currently maintained on aspirin 325 mg and Plavix for CVA prophylaxis x3 months then aspirin alone.  Patient did undergo loop recorder placement.  Subcutaneous Lovenox for DVT prophylaxis.  He is on a dysphagia #2 thin liquid diet.  Therapy evaluations completed and patient was admitted for a comprehensive rehab program. ? ?Review of Systems  ?Constitutional: Positive for malaise/fatigue. Negative for chills and fever.  ?HENT: Negative for hearing loss.   ?Eyes: Negative for blurred vision and double vision.  ?Respiratory: Negative for cough and shortness of breath.   ?Cardiovascular: Negative for chest pain, palpitations and leg swelling.  ?Gastrointestinal: Positive for constipation. Negative for heartburn, nausea and vomiting.  ?     GERD  ?Genitourinary: Negative for dysuria, flank pain and hematuria.  ?Musculoskeletal: Positive for myalgias.  ?Skin: Negative for rash.  ? Neurological: Positive for speech change, focal weakness and weakness.  ?All o

## 2019-07-07 NOTE — Progress Notes (Signed)
Inpatient Rehab Admissions Coordinator:   I have a bed available for this pt to admit to CIR today.  Will let pt/family and TOC team know.   Shann Medal, PT, DPT Admissions Coordinator [phone] 07/07/19  10:07 AM

## 2019-07-07 NOTE — Care Management Important Message (Signed)
Important Message  Patient Details  Name: Tyyon Didonato MRN: [id number] Date of Birth: 1950-12-12   Medicare Important Message Given:  Yes - Important Message mailed due to current National Emergency  Verbal consent obtained due to current National Emergency  Relationship to patient: Self Contact Name: Kahron Beets Call Date: 07/07/19  Time: [id number] Phone: [phone] Outcome: No Answer/Busy Important Message mailed to: Patient address on file    Delorse Lek 07/07/2019, 1:28 PM

## 2019-07-07 NOTE — Progress Notes (Signed)
Inpatient Rehabilitation  Patient information reviewed and entered into eRehab system by Merion Grimaldo M. Zoejane Gaulin, M.A., CCC/SLP, PPS Coordinator.  Information including medical coding, functional ability and quality indicators will be reviewed and updated through discharge.    

## 2019-07-07 NOTE — Progress Notes (Signed)
PMR Admission Coordinator Pre-Admission Assessment   Patient: Russell Russell is an 69 y.o., male MRN: [id number] DOB: 05/05/1950 Height: 5\' 10"  (177.8 cm) Weight: 63.2 kg                                                                                                                                                  Insurance Information HMO:     PPO:      PCP:      IPA:      80/20:      OTHER:  PRIMARY: Medicare A and B      Policy#: [id number]      Subscriber: pt CM Name:       Phone#:      Fax#:  Pre-Cert#: verified on [employer]      Employer:  Benefits:  Phone #:      Name:  Eff. Date: A 03/12/17, B 02/10/19     Deduct: $1484      Out of Pocket Max: n/a      Life Max: n/a  CIR: 100%      SNF: 20 full days Outpatient: 80%     Co-Pay: 20% Home Health: 100%      Co-Pay:  DME: 80%     Co-Pay: 20% Providers: pt choice SECONDARY:       Policy#:       Phone#:    Development worker, community:       Phone#:    The Therapist, art Information Summary" for patients in Inpatient Rehabilitation Facilities with attached "Privacy Act [place] Records" was provided and verbally reviewed with: Patient and Family   Emergency Contact Information Contact Information     Name Relation Home Work Mobile    [place] Spouse [id number]   [phone]       Current Medical History  Patient Admitting Diagnosis: posterior R basal ganglia, R cerebral hemisphere, R cerebellar infarcts   History of Present Illness: Russell Russell is a 69 y.o. right-handed male with history of alcohol/tobacco abuse, hyperlipidemia.  He presented in the late evening of 06/30/2019 with left hemiparesis.  Cranial CT unremarkable for acute intracranial abnormality.  Patient did not receive TPA.  CT angio of head and neck no emergent large vessel occlusion or high-grade stenosis.  MRI showed multiple scattered acute ischemic infarcts involving the posterior right basal ganglia right cerebral hemisphere and right cerebellum.   Echocardiogram with ejection fraction of 60%.  Lower extremity Dopplers negative for DVT.  Admission chemistries unremarkable, alcohol level 18, SARS coronavirus negative.  Currently maintained on aspirin and Plavix for CVA prophylaxis.  Subcutaneous Lovenox for DVT prophylaxis.  Hospital course further complicated by dysphagia and started on dysphagia #2, thin liquid diet.  Therapy evaluations completed with recommendations for CIR.   Complete NIHSS TOTAL: 6 Glasgow Coma Scale Score: 15   Past Medical History  Past Medical History:  Diagnosis Date  . Alcohol abuse      drinks 1 gallon of Gin every weekend, nothing during the week x >15 yrs  . Elevated transaminase level      +elev bili: abd u/s 06/2014 showed stable small hepatic hemangiomas, o/w normal.  . Essential hypertension    . GERD (gastroesophageal reflux disease)    . Hyperlipidemia 03/2014    Atorv started-chol improved  . Impaired fasting glucose 03/2014  . Nephrolithiasis    . PUD (peptic ulcer disease)    . Tobacco dependence      chantix: "psych effects"      Family History  family history includes Breast cancer in his sister and sister; Cancer in his father; Colon cancer in his mother.   Prior Rehab/Hospitalizations:  Has the patient had prior rehab or hospitalizations prior to admission? No   Has the patient had major surgery during 100 days prior to admission? Yes   Current Medications    Current Facility-Administered Medications:  .  acetaminophen (TYLENOL) tablet 650 mg, 650 mg, Oral, Q4H PRN **OR** acetaminophen (TYLENOL) 160 MG/5ML solution 650 mg, 650 mg, Per Tube, Q4H PRN **OR** acetaminophen (TYLENOL) suppository 650 mg, 650 mg, Rectal, Q4H PRN, Allred, James, MD .  aspirin suppository 300 mg, 300 mg, Rectal, Daily **OR** aspirin tablet 325 mg, 325 mg, Oral, Daily, Allred, James, MD, 325 mg at 07/07/19 0913 .  atorvastatin (LIPITOR) tablet 40 mg, 40 mg, Oral, Daily, Allred, James, MD, 40 mg at 07/07/19  0915 .  chlorhexidine (PERIDEX) 0.12 % solution 15 mL, 15 mL, Mouth Rinse, BID, Allred, James, MD, 15 mL at 07/07/19 0913 .  clopidogrel (PLAVIX) tablet 75 mg, 75 mg, Oral, Daily, Allred, James, MD, 75 mg at 07/07/19 0915 .  enoxaparin (LOVENOX) injection 40 mg, 40 mg, Subcutaneous, Q24H, Allred, James, MD, 40 mg at 07/06/19 2156 .  feeding supplement (ENSURE ENLIVE) (ENSURE ENLIVE) liquid 237 mL, 237 mL, Oral, BID BM, Allred, James, MD, 237 mL at 07/07/19 0916 .  folic acid injection 1 mg, 1 mg, Intravenous, Daily, Allred, James, MD, 1 mg at 07/07/19 0920 .  labetalol (NORMODYNE) injection 10 mg, 10 mg, Intravenous, Q2H PRN, Allred, James, MD, 10 mg at 07/02/19 2202 .  MEDLINE mouth rinse, 15 mL, Mouth Rinse, q12n4p, Allred, James, MD, 15 mL at 07/06/19 1543 .  metoprolol tartrate (LOPRESSOR) tablet 12.5 mg, 12.5 mg, Oral, BID, Elgergawy, Silver Huguenin, MD, 12.5 mg at 07/07/19 0914 .  multivitamin with minerals tablet 1 tablet, 1 tablet, Oral, Daily, Allred, James, MD, 1 tablet at 07/07/19 0915 .  nicotine (NICODERM CQ - dosed in mg/24 hours) patch 21 mg, 21 mg, Transdermal, Daily, Elgergawy, Silver Huguenin, MD, 21 mg at 07/07/19 0915 .  pantoprazole (PROTONIX) EC tablet 40 mg, 40 mg, Oral, Daily, Allred, James, MD, 40 mg at 07/07/19 0916 .  senna-docusate (Senokot-S) tablet 1 tablet, 1 tablet, Oral, QHS PRN, Allred, James, MD .  thiamine tablet 100 mg, 100 mg, Oral, Daily, 100 mg at 07/07/19 0914 **OR** thiamine (B-1) injection 100 mg, 100 mg, Intravenous, Daily, Allred, James, MD, 100 mg at 07/01/19 [id number]   Patients Current Diet:     Diet Order                      DIET DYS 2 Room service appropriate? Yes with Assist; Fluid consistency: Thin  Diet effective now  Precautions / Restrictions Precautions Precautions: Fall, Other (comment) Precaution Comments: permissive HTN initially (<220/120) then normotension, L sided weakness, aspiration precautions (dysphagia 2 with thins), O2  sat >94% Restrictions Weight Bearing Restrictions: No    Has the patient had 2 or more falls or a fall with injury in the past year?No   Prior Activity Level Community (5-7x/wk): recently retired from [employer], driving, getting out of the house daily after retirement, no AD used   Prior Functional Level Prior Function Level of Independence: Independent Comments: Independent without AD    Self Care: Did the patient need help bathing, dressing, using the toilet or eating?  Independent   Indoor Mobility: Did the patient need assistance with walking from room to room (with or without device)? Independent   Stairs: Did the patient need assistance with internal or external stairs (with or without device)? Independent   Functional Cognition: Did the patient need help planning regular tasks such as shopping or remembering to take medications? Independent   Home Assistive Devices / Equipment Home Assistive Devices/Equipment: None Home Equipment: None   Prior Device Use: Indicate devices/aids used by the patient prior to current illness, exacerbation or injury? None of the above   Current Functional Level Cognition   Overall Cognitive Status: Within Functional Limits for tasks assessed Orientation Level: Oriented to person, Oriented to place, Oriented to situation, Disoriented to time General Comments: Follows directions with cues.    Extremity Assessment (includes Sensation/Coordination)   Upper Extremity Assessment: LUE deficits/detail LUE Deficits / Details: Improving strength to 3-/5 in L UE at all joints, AROM WFL LUE Sensation: WNL LUE Coordination: decreased fine motor, decreased gross motor  Lower Extremity Assessment: Defer to PT evaluation RLE Deficits / Details: >/=3/5 RLE Sensation: (intact to light touch) LLE Deficits / Details: grossly 2-/5 except for L foot 0/5 LLE Sensation: (intact to light touch) LLE Coordination: decreased gross motor, decreased fine motor      ADLs   Overall ADL's : Needs assistance/impaired Eating/Feeding: Sitting, Set up Grooming: Minimal assistance, Sitting, Wash/dry face Grooming Details (indicate cue type and reason): Assistance to wipe mouth due to difficulty keeping secretions in mouth Upper Body Bathing: Sitting, Maximal assistance Lower Body Bathing: Total assistance, Bed level Upper Body Dressing : Maximal assistance, Sitting Upper Body Dressing Details (indicate cue type and reason): Max A to don hospital gown around back and front  Lower Body Dressing: Total assistance, Bed level Toilet Transfer: +2 for physical assistance Toilet Transfer Details (indicate cue type and reason): did not attempt, but suspect +2 physical assist required Toileting- Clothing Manipulation and Hygiene: Total assistance, Bed level General ADL Comments: Pt requires increased assistance for ADLs due to decreased strength in L UE/LE and core, decreased sitting balance, coordination, endurance      Mobility   Overal bed mobility: Needs Assistance Bed Mobility: Supine to Sit Supine to sit: Max assist, +2 for physical assistance, +2 for safety/equipment, HOB elevated Sit to supine: Max assist, +2 for physical assistance, +2 for safety/equipment, HOB elevated General bed mobility comments: Encouraged use of bedrail to assist. Improved ability to advance B LE to EOB     Transfers   Overall transfer level: Needs assistance Equipment used: None Transfer via Lift Equipment: Stedy Transfers: Sit to/from Stand Sit to Stand: Max assist, +2 physical assistance, +2 safety/equipment, From elevated surface, Mod assist Stand pivot transfers: Mod assist, Max assist General transfer comment: Use of Stedy for transfer bed>chair and sit<>stand from chair. Patient still with L and anterior  lean. Cues and assist for weight shifting to right to reach midline.       Ambulation / Gait / Stairs / Wheelchair Mobility   Ambulation/Gait General Gait Details: not  safe to attempt yet     Posture / Balance Dynamic Sitting Balance Sitting balance - Comments: Verbal, tactile, and visual cues for improved midline positioning. Patient sits with L and anterior lean. With modA, patient is able to reach for foot rail of bed to assist with maintaining sitting balance. Balance Overall balance assessment: Needs assistance Sitting-balance support: Feet supported, Single extremity supported, Bilateral upper extremity supported Sitting balance-Leahy Scale: Poor Sitting balance - Comments: Verbal, tactile, and visual cues for improved midline positioning. Patient sits with L and anterior lean. With modA, patient is able to reach for foot rail of bed to assist with maintaining sitting balance. Postural control: Left lateral lean, Other (comment)(anterior lean) Standing balance support: Bilateral upper extremity supported Standing balance-Leahy Scale: Zero Standing balance comment: L lateral and anterior lean. Tactile, visual, and verbal cues for attempting to progress to midline positioning but patient unable to reach or maintain midline without assistance. Increased assistance required as patient fatigues.     Special needs/care consideration Designated visitor spouse, Zevion Song        Previous Home Environment (from acute therapy documentation) Living Arrangements: Spouse/significant other Available Help at Discharge: Family, Available PRN/intermittently(wife works but retiring in June) Type of Home: [place]: One level Home Access: Stairs to enter [employer] of Steps: 1(1 step into house ) Bathroom Shower/Tub: Chiropodist: [place]: No Additional Comments: Pt's wife reports they are planning to build a house (signed papework, getting walk in with grab bars, etc)   Discharge Living Setting Plans for Discharge Living Setting: Patient's home Type of Home at Discharge: [place]:  One level Discharge Home Access: Stairs to enter Entrance Stairs-Rails: None Entrance Stairs-Number of Steps: 1+1 Discharge Bathroom Shower/Tub: Tub/shower unit Discharge Bathroom Toilet: Standard Discharge Bathroom Accessibility: Yes How Accessible: Accessible via walker Does the patient have any problems obtaining your medications?: No   Social/Family/Support Systems Patient Roles: Spouse Anticipated Caregiver: spouse, [name] is retiring in June + will have arranged care for 24/7 from d/c until she retires Anticipated Ambulance person Information: [phone] Ability/Limitations of Caregiver: min assist Caregiver Availability: 24/7 Discharge Plan Discussed with Primary Caregiver: Yes Is Caregiver In Agreement with Plan?: Yes Does Caregiver/Family have Issues with Lodging/Transportation while Pt is in Rehab?: No     Goals Patient/Family Goal for Rehab: PT/OT/SLP min assist Expected length of stay: 21-24 days Pt/Family Agrees to Admission and willing to participate: Yes Program Orientation Provided & Reviewed with Pt/Caregiver Including Roles  & Responsibilities: Yes     Decrease burden of Care through IP rehab admission: n/a   Possible need for SNF placement upon discharge: Not anticipated     Patient Condition: This patient's medical and functional status has changed since the consult dated: 07/03/2019 in which the Rehabilitation Physician determined and documented that the patient's condition is appropriate for intensive rehabilitative care in an inpatient rehabilitation facility. See "History of Present Illness" (above) for medical update. Functional changes are: max +2 to transfer. Patient's medical and functional status update has been discussed with the Rehabilitation physician and patient remains appropriate for inpatient rehabilitation. Will admit to inpatient rehab today.   Preadmission Screen Completed By:  Michel Santee, PT, DPT 07/07/2019 10:07  AM ______________________________________________________________________   Discussed status with Dr. Dagoberto Ligas  on 07/07/19 at  10:24 AM  and received approval for admission today.   Admission Coordinator:  Michel Santee, PT, DPT time 10:24 AM Sudie Grumbling 07/07/19

## 2019-07-07 NOTE — H&P (Signed)
Physical Medicine and Rehabilitation Admission H&P    No chief complaint on file. : HPI: Russell Russell is a 69 year old right-handed male with history of alcohol/tobacco abuse, hyperlipidemia.  Per chart review and wife due to memory difficulties patient lives with spouse currently working but planning on retiring in June 2021.  1 level home one-step to entry.  Reportedly independent prior to admission.  He presented in the late evening of 06/30/2019 with left hemiparesis.  Cranial CT scan unremarkable for acute intracranial process.  Patient did not receive TPA.  CT angiogram head and neck no emergent large vessel occlusion or high-grade stenosis.  MRI showed multiple scattered acute ischemic infarcts involving the posterior right basal ganglia right cerebral hemisphere and right cerebellum.  Echocardiogram with ejection fraction of 60%.  No source of embolus.  Lower extremity Dopplers negative.  Admission chemistries unremarkable, alcohol level 18, SARS coronavirus negative.  Currently maintained on aspirin 325 mg and Plavix for CVA prophylaxis x3 months then aspirin alone.  Patient did undergo loop recorder placement.  Subcutaneous Lovenox for DVT prophylaxis.  He is on a dysphagia #2 thin liquid diet.  Therapy evaluations completed and patient was admitted for a comprehensive rehab program.  Pt reports LBM today- voiding well- denies pain.  Been having bMs after every meal.  Drinks 1 pint and 6 beers/day and smokes 1/2 ppd.     Review of Systems  Constitutional: Positive for malaise/fatigue. Negative for chills and fever.  HENT: Negative for hearing loss.   Eyes: Negative for blurred vision and double vision.  Respiratory: Negative for cough and shortness of breath.   Cardiovascular: Negative for chest pain, palpitations and leg swelling.  Gastrointestinal: Positive for constipation. Negative for heartburn, nausea and vomiting.       GERD  Genitourinary: Negative for dysuria, flank  pain and hematuria.  Musculoskeletal: Positive for myalgias.  Skin: Negative for rash.  Neurological: Positive for speech change, focal weakness and weakness.  All other systems reviewed and are negative.  Past Medical History:  Diagnosis Date  . Alcohol abuse    drinks 1 gallon of Gin every weekend, nothing during the week x >15 yrs  . Elevated transaminase level    +elev bili: abd u/s 06/2014 showed stable small hepatic hemangiomas, o/w normal.  . Essential hypertension   . GERD (gastroesophageal reflux disease)   . Hyperlipidemia 03/2014   Atorv started-chol improved  . Impaired fasting glucose 03/2014  . Nephrolithiasis   . PUD (peptic ulcer disease)   . Tobacco dependence    chantix: "psych effects"   Past Surgical History:  Procedure Laterality Date  . COLONOSCOPY  2005   Recall 10 yrs ([place])  . LOOP RECORDER INSERTION N/A 07/03/2019   Procedure: LOOP RECORDER INSERTION;  Surgeon: Thompson Grayer, MD;  Location: [facility];  Service: Cardiovascular;  Laterality: N/A;  . removal of kidney stones  2006   cystoscopic--removed from ureter.  No prob since.   Family History  Problem Relation Age of Onset  . Colon cancer Mother   . Cancer Father   . Breast cancer Sister   . Breast cancer Sister    Social History:  reports that he has been smoking cigarettes. He has a 10.00 pack-year smoking history. He has never used smokeless tobacco. He reports current alcohol use of about 8.0 standard drinks of alcohol per week. He reports that he does not use drugs. Allergies:  Allergies  Allergen Reactions  . Penicillins Rash  Medications Prior to Admission  Medication Sig Dispense Refill  . feeding supplement, ENSURE ENLIVE, (ENSURE ENLIVE) LIQD Take 237 mLs by mouth 2 (two) times daily between meals. 237 mL 12  . [START ON 07/08/2019] nicotine (NICODERM CQ - DOSED IN MG/24 HOURS) 21 mg/24hr patch Place 1 patch (21 mg total) onto the skin daily. 28 patch 0  . omeprazole  (PRILOSEC) 20 MG capsule Take 20 mg by mouth as needed (acid reflux).    Derrill Memo ON 07/08/2019] aspirin 325 MG tablet Take 1 tablet (325 mg total) by mouth daily. (Patient not taking: Reported on 07/07/2019)    . atorvastatin (LIPITOR) 40 MG tablet Take 1 tablet (40 mg total) by mouth daily. (Patient not taking: Reported on 07/07/2019) 90 tablet 1  . [START ON 08/06/2019] clopidogrel (PLAVIX) 75 MG tablet Take 1 tablet (75 mg total) by mouth daily. Continue for 3 months then stop. (Patient not taking: Reported on 07/07/2019)    . folic acid (FOLVITE) 1 MG tablet Take 1 tablet (1 mg total) by mouth daily. (Patient not taking: Reported on 07/07/2019)  3  . halobetasol (ULTRAVATE) 0.05 % cream Apply topically 2 (two) times daily. (Patient not taking: Reported on 06/30/2019) 50 g 3  . metoprolol tartrate (LOPRESSOR) 25 MG tablet Take 0.5 tablets (12.5 mg total) by mouth 2 (two) times daily. (Patient not taking: Reported on 07/07/2019)    . [START ON 07/08/2019] pantoprazole (PROTONIX) 40 MG tablet Take 1 tablet (40 mg total) by mouth daily. (Patient not taking: Reported on 07/07/2019)    . [START ON 07/08/2019] thiamine 100 MG tablet Take 1 tablet (100 mg total) by mouth daily. (Patient not taking: Reported on 07/07/2019)      Drug Regimen Review Drug regimen was reviewed and remains appropriate with no significant issues identified  Home: Home Living Family/patient expects to be discharged to:: Private residence Living Arrangements: Spouse/significant other   Functional History:    Functional Status:  Mobility:          ADL:    Cognition: Cognition Orientation Level: Oriented X4    Physical Exam: Blood pressure (!) 115/91, pulse 93, resp. rate 20, SpO2 98 %. Physical Exam  Nursing note and vitals reviewed. Constitutional: He is oriented to person, place, and time.  Older male sitting in bed, wife at bedside, appropriate, NAD; slim, tall  HENT:  Head: Normocephalic and atraumatic.    Nose: Nose normal.  Mouth/Throat: Oropharynx is clear and moist. No oropharyngeal exudate.  Poor dentition L facial droop L tongue deviation No facial sensation changes  Eyes: Conjunctivae and EOM are normal. Right eye exhibits no discharge. Left eye exhibits no discharge.  No nystagmus  Neck: No tracheal deviation present.  Cardiovascular: Normal rate, regular rhythm and normal heart sounds.  No murmur heard. Respiratory: Effort normal and breath sounds normal. No stridor. No respiratory distress. He has no wheezes.  GI: Soft. Bowel sounds are normal. He exhibits no distension. There is no abdominal tenderness. There is no rebound.  Genitourinary:    Genitourinary Comments: Wearing external catheter- attached to foley bag   Musculoskeletal:     Cervical back: Normal range of motion and neck supple.     Comments: LUE- biceps 3+/5, triceps 4/4, WE 4-/5, grip 4/5 finger abd 4-/5 RUE 5/5 in same muscles LLE- HF 1/5, KE 2-/5, KF 1/5, DF 2/5, PF 0/5 RLE_ 5/5 in same muscles   Neurological: He is oriented to person, place, and time. Coordination normal.  Patient is alert  and a bit impulsive.  Speech is dysarthric but intelligible.  Follows commands.  Provides his name and age. Intact sensation to light touch in all 4 extremities B/L No clonus or hoffman's B/L  Skin:  R hand IV- no infiltration Very LONG toenails- just doesn't cut them when supposed to - ~ 1cm past some toes- curling over No heel or backside skin issues Healed ulcers on legs B/L -just scars left  Psychiatric: He has a normal mood and affect.    Results for orders placed or performed during the hospital encounter of 06/30/19 (from the past 48 hour(s))  CBC     Status: Abnormal   Collection Time: 07/07/19  3:52 AM  Result Value Ref Range   WBC 4.5 4.0 - 10.5 K/uL   RBC 5.53 4.22 - 5.81 MIL/uL   Hemoglobin 17.5 (H) 13.0 - 17.0 g/dL   HCT 50.6 39.0 - 52.0 %   MCV 91.5 80.0 - 100.0 fL   MCH 31.6 26.0 - 34.0 pg    MCHC 34.6 30.0 - 36.0 g/dL   RDW 15.9 (H) 11.5 - 15.5 %   Platelets 245 150 - 400 K/uL   nRBC 0.0 0.0 - 0.2 %    Comment: Performed at [facility], [place] [address], [place], [place] [id number]  Basic metabolic panel     Status: Abnormal   Collection Time: 07/07/19  3:52 AM  Result Value Ref Range   Sodium 137 135 - 145 mmol/L   Potassium 3.9 3.5 - 5.1 mmol/L   Chloride 102 98 - 111 mmol/L   CO2 25 22 - 32 mmol/L   Glucose, Bld 107 (H) 70 - 99 mg/dL    Comment: Glucose reference range applies only to samples taken after fasting for at least 8 hours.   BUN 20 8 - 23 mg/dL   Creatinine, Ser 1.02 0.61 - 1.24 mg/dL   Calcium 9.3 8.9 - 10.3 mg/dL   GFR calc non Af Amer >60 >60 mL/min   GFR calc Af Amer >60 >60 mL/min   Anion gap 10 5 - 15    Comment: Performed at [facility] [address], [place], [place] [address]   No results found.     Medical Problem List and Plan: 1.  Left side weakness secondary to multiple scattered anterior and posterior right brain infarcts.  Status post loop recorder  -patient may  shower  -ELOS/Goals: 2 weeks; goals Supervision to min assist 2.  Antithrombotics: -DVT/anticoagulation: Lovenox  -antiplatelet therapy: Aspirin 325 mg daily and Plavix 75 mg daily x3 months then aspirin alone 3. Pain Management: Tylenol as needed 4. Mood: Provide emotional support  -antipsychotic agents: N/A 5. Neuropsych: This patient is capable of making decisions on his own behalf. 6. Skin/Wound Care: Routine skin checks 7. Fluids/Electrolytes/Nutrition: Routine in and outs with follow-up chemistries 8.  Hypertension.  Lopressor 12.5 mg twice daily.  Monitor with increased mobility 9.  Hyperlipidemia.  Lipitor 10.  Dysphagia.  Dysphagia #2 thin liquids.  Follow-up speech therapy 11.  History of tobacco and alcohol use.  NicoDerm patch.  Provide counseling. Was drinking 6 pack and 1 pint daily.     Jethro Bolus, PA-C 07/07/2019  I have personally  performed a face to face diagnostic evaluation of this patient and formulated the key components of the plan.  Additionally, I have personally reviewed laboratory data, imaging studies, as well as relevant notes and concur with the physician assistant's documentation above.   The  patient's status has not changed from the original H&P.  Any changes in documentation from the acute care chart have been noted above.     Courtney Heys, MD 07/07/2019

## 2019-07-07 NOTE — Progress Notes (Signed)
Physical Medicine and Rehabilitation Consult Reason for Consult: Left side weakness Referring Physician: [facility]     HPI: Russell Russell is a 69 y.o. right-handed male with history of alcohol/tobacco abuse, hyperlipidemia.  History taken from chart review and wife due to memory difficulties.  Patient lives with spouse.  Wife is currently working, but Radio broadcast assistant on retiring in June.  1 level home one-step to entry.  Reportedly independent prior to admission.  He presented in the late evening of 06/30/2019 with left hemiparesis.  Cranial CT unremarkable for acute intracranial abnormality.  Patient did not receive TPA.  CT angio of head and neck no emergent large vessel occlusion or high-grade stenosis.  MRI showed multiple scattered acute ischemic infarcts involving the posterior right basal ganglia right cerebral hemisphere and right cerebellum.  Echocardiogram with ejection fraction of 60%.  Lower extremity Dopplers negative for DVT.  Admission chemistries unremarkable, alcohol level 18, SARS coronavirus negative.  Currently maintained on aspirin and Plavix for CVA prophylaxis.  Subcutaneous Lovenox for DVT prophylaxis.  Hospital course further complicated by dysphagia and started on dysphagia #2 thin liquid diet.  Therapy evaluations completed with recommendations of physical medicine rehab consult.   Review of Systems  Constitutional: Positive for malaise/fatigue. Negative for chills and fever.  HENT: Negative for hearing loss.   Eyes: Negative for blurred vision and double vision.  Respiratory: Negative for cough and shortness of breath.   Cardiovascular: Negative for chest pain, palpitations and leg swelling.  Gastrointestinal: Positive for constipation. Negative for heartburn, nausea and vomiting.       GERD  Genitourinary: Negative for dysuria, flank pain and hematuria.  Musculoskeletal: Positive for myalgias.  Skin: Negative for rash.  Neurological: Positive for speech change, focal  weakness and weakness. Negative for sensory change.  All other systems reviewed and are negative.       Past Medical History:  Diagnosis Date  . Alcohol abuse      drinks 1 gallon of Gin every weekend, nothing during the week x >15 yrs  . Elevated transaminase level      +elev bili: abd u/s 06/2014 showed stable small hepatic hemangiomas, o/w normal.  . Essential hypertension    . GERD (gastroesophageal reflux disease)    . Hyperlipidemia 03/2014    Atorv started-chol improved  . Impaired fasting glucose 03/2014  . Nephrolithiasis    . PUD (peptic ulcer disease)    . Tobacco dependence      chantix: "psych effects"    Past Surgical History:  Procedure Laterality Date  . COLONOSCOPY   2005    Recall 10 yrs ([place])  . removal of kidney stones   2006    cystoscopic--removed from ureter.  No prob since.         Family History  Problem Relation Age of Onset  . Colon cancer Mother    . Cancer Father    . Breast cancer Sister    . Breast cancer Sister      Social History:  reports that he has been smoking cigarettes. He has a 10.00 pack-year smoking history. He has never used smokeless tobacco. He reports current alcohol use of about 8.0 standard drinks of alcohol per week. He reports that he does not use drugs. Allergies:      Allergies  Allergen Reactions  . Penicillins Rash          Medications Prior to Admission  Medication Sig Dispense Refill  .  atorvastatin (LIPITOR) 40 MG tablet Take 1 tablet (40 mg total) by mouth daily. 90 tablet 1  . amLODipine (NORVASC) 10 MG tablet Take 1 tablet (10 mg total) by mouth daily. (Patient not taking: Reported on 06/30/2019) 30 tablet 1  . halobetasol (ULTRAVATE) 0.05 % cream Apply topically 2 (two) times daily. (Patient not taking: Reported on 06/30/2019) 50 g 3      Home: Home Living Family/patient expects to be discharged to:: Private residence Living Arrangements: Spouse/significant other Available Help at Discharge: Family,  Available PRN/intermittently(wife works but retiring in June) Type of Home: [place] Access: Stairs to enter [employer] of Steps: 1(1 step into house ) Home Layout: One level Bathroom Shower/Tub: Chiropodist: Standard Home Equipment: None Additional Comments: Pt's wife reports they are planning to build a house (signed papework, getting walk in with grab bars, etc)  Functional History: Prior Function Level of Independence: Independent Comments: Independent without AD  Functional Status:  Mobility: Bed Mobility Overal bed mobility: Needs Assistance Bed Mobility: Supine to Sit Supine to sit: Mod assist, Max assist, +2 for physical assistance, +2 for safety/equipment, HOB elevated Sit to supine: Max assist, +2 for physical assistance, +2 for safety/equipment, HOB elevated General bed mobility comments: Encouraged use of bedrail with RUE Transfers Overall transfer level: Needs assistance Equipment used: None Transfer via Lift Equipment: Stedy Transfers: Sit to/from Stand Sit to Stand: Max assist, +2 physical assistance, +2 safety/equipment, From elevated surface General transfer comment: use of Stedy for transfer EOB>chair with bed height increased. Patient leans to L in sitting and standing and requires cues and assist for midline positioning with use of Stedy. Ambulation/Gait General Gait Details: not safe to attempt at this time   ADL: ADL Overall ADL's : Needs assistance/impaired Eating/Feeding: Sitting, Set up Grooming: Moderate assistance, Sitting Grooming Details (indicate cue type and reason): Assistance to wipe mouth due to difficulty keeping secretions in mouth Upper Body Bathing: Sitting, Maximal assistance Lower Body Bathing: Total assistance, Bed level Upper Body Dressing : Maximal assistance, Sitting Upper Body Dressing Details (indicate cue type and reason): Max A to don hospital gown around back and front  Lower Body Dressing:  Total assistance, Bed level Toilet Transfer: +2 for physical assistance Toilet Transfer Details (indicate cue type and reason): did not attempt, but suspect +2 physical assist required Toileting- Clothing Manipulation and Hygiene: Total assistance, Bed level General ADL Comments: Pt requires increased assistance for ADLs due to decreased strength in L UE/LE and core, decreased sitting balance, coordination, endurance    Cognition: Cognition Orientation Level: Oriented X4 Cognition Arousal/Alertness: Awake/alert Behavior During Therapy: WFL for tasks assessed/performed General Comments: Able to answer questions appropriately, follow directions   Blood pressure (!) 144/107, pulse (!) 102, temperature 98.5 F (36.9 C), temperature source Oral, resp. rate 20, height 5\' 10"  (1.778 m), weight 63.2 kg, SpO2 90 %. Physical Exam  Vitals reviewed. Constitutional: He appears well-developed and well-nourished.  HENT:  Head: Normocephalic and atraumatic.  Eyes: EOM are normal. Right eye exhibits no discharge. Left eye exhibits no discharge.  Neck: No tracheal deviation present. No thyromegaly present.  Respiratory: Effort normal. No stridor. No respiratory distress.  GI: Soft. He exhibits no distension.  Musculoskeletal:     Comments: No edema or tenderness in extremities  Neurological: He is alert.  Patient is alert and impulsive.   Makes eye contact with examiner.   Dysarthria Follows commands.   Provides name and age.   Limited awareness of deficits.  Motor:  Right upper extremity: 4 -/5 with ataxia Left upper extremity: Shoulder abduction 2/5, elbow flexion/extension 2+/5, handgrip 3/5 with apraxia Right lower extremity: 2/5 proximal distal with ataxia Left lower extremity: 0/5 Sensation intact light touch  Skin: Skin is warm and dry.  Psychiatric: He has a normal mood and affect. His behavior is normal.      Lab Results Last 24 Hours       Results for orders placed or performed  during the hospital encounter of 06/30/19 (from the past 24 hour(s))  Basic metabolic panel     Status: Abnormal    Collection Time: 07/03/19  7:03 AM  Result Value Ref Range    Sodium 135 135 - 145 mmol/L    Potassium 3.7 3.5 - 5.1 mmol/L    Chloride 101 98 - 111 mmol/L    CO2 24 22 - 32 mmol/L    Glucose, Bld 92 70 - 99 mg/dL    BUN <5 (L) 8 - 23 mg/dL    Creatinine, Ser 1.02 0.61 - 1.24 mg/dL    Calcium 8.9 8.9 - 10.3 mg/dL    GFR calc non Af Amer >60 >60 mL/min    GFR calc Af Amer >60 >60 mL/min    Anion gap 10 5 - 15       Imaging Results (Last 48 hours)  EP PPM/ICD IMPLANT   Result Date: 07/03/2019 SURGEON:  Thompson Grayer, MD   PREPROCEDURE DIAGNOSIS:  Cryptogenic Stroke   POSTPROCEDURE DIAGNOSIS:  Cryptogenic Stroke    PROCEDURES:  1. Implantable loop recorder implantation   INTRODUCTION:  Scarlett Hyacinthe is a 69 y.o. male with a history of unexplained stroke who presents today for implantable loop implantation.  The patient has had a cryptogenic stroke.  Despite an extensive workup by neurology, no reversible causes have been identified.  he has worn telemetry during which he did not have arrhythmias.  There is significant concern for possible atrial fibrillation as the cause for the patients stroke.  The patient therefore presents today for implantable loop implantation.   DESCRIPTION OF PROCEDURE:  Informed written consent was obtained.  The patient required no sedation for the procedure today.  The patients left chest was prepped and draped. Mapping over the patient's chest was performed to identify the appropriate ILR site.  This area was found to be the left parasternal region over the 3rd-4th intercostal space.  The skin overlying this region was infiltrated with lidocaine for local analgesia.  A 0.5-cm incision was made at the implant site.  A subcutaneous ILR pocket was fashioned using a combination of sharp and blunt dissection.  A Medtronic Reveal Linq model [id number] implantable  loop recorder was then placed into the pocket R waves were very prominent and measured > 0.2 mV. EBL<1 ml.  Steri- Strips and a sterile dressing were then applied.  There were no early apparent complications.   CONCLUSIONS:  1. Successful implantation of a Medtronic Reveal LINQ implantable loop recorder for cryptogenic stroke  2. No early apparent complications. Thompson Grayer MD, [facility] 07/03/2019 12:57 PM    ECHOCARDIOGRAM COMPLETE BUBBLE STUDY   Result Date: 07/01/2019    ECHOCARDIOGRAM REPORT   Patient Name:   JARETT HAMMACK Date of Exam: 07/01/2019 Medical Rec #:  [id number]      Height:       70.0 in Accession #:    [id number]     Weight:       151.0 lb Date of Birth:  1950/06/06  BSA:          1.852 m Patient Age:    18 years       BP:           161/130 mmHg Patient Gender: M              HR:           83 bpm. Exam Location:  Inpatient Procedure: 2D Echo, Color Doppler, Cardiac Doppler and Saline Contrast Bubble            Study Indications:    Stroke i163.9  History:        Patient has no prior history of Echocardiogram examinations.                 Risk Factors:Hypertension and Dyslipidemia.  Sonographer:    Raquel Sarna Senior RDCS Referring Phys: [place]  Sonographer Comments: Technically difficult study due to poor echo windows, suboptimal parasternal window and suboptimal apical window. Technically difficult due to lung interference and respiratory variation. Patient coughing and moving thoughout exam. No parasternal, apical, or suprasternal window. IMPRESSIONS  1. Very poor study. All views basically obtained from the subcostal views.  2. Left ventricular ejection fraction, by estimation, is 55 to 60%. The left ventricle has normal function. The left ventricle has no regional wall motion abnormalities. Left ventricular diastolic function could not be evaluated.  3. Right ventricular systolic function is normal. The right ventricular size is normal.  4. The mitral valve is grossly normal. No  evidence of mitral valve regurgitation. No evidence of mitral stenosis.  5. The aortic valve is grossly normal. Aortic valve regurgitation is not visualized. No aortic stenosis is present.  6. The inferior vena cava is normal in size with greater than 50% respiratory variability, suggesting right atrial pressure of 3 mmHg.  7. Agitated saline contrast bubble study was negative, with no evidence of any interatrial shunt. Conclusion(s)/Recommendation(s): No intracardiac source of embolism detected on this transthoracic study. A transesophageal echocardiogram is recommended to exclude cardiac source of embolism if clinically indicated. FINDINGS  Left Ventricle: Left ventricular ejection fraction, by estimation, is 55 to 60%. The left ventricle has normal function. The left ventricle has no regional wall motion abnormalities. The left ventricular internal cavity size was normal in size. There is  no left ventricular hypertrophy. Left ventricular diastolic function could not be evaluated due to nondiagnostic images. Left ventricular diastolic function could not be evaluated. Right Ventricle: The right ventricular size is normal. No increase in right ventricular wall thickness. Right ventricular systolic function is normal. Left Atrium: Left atrial size was normal in size. Right Atrium: Right atrial size was normal in size. Pericardium: Trivial pericardial effusion is present. Mitral Valve: The mitral valve is grossly normal. No evidence of mitral valve regurgitation. No evidence of mitral valve stenosis. Tricuspid Valve: The tricuspid valve is grossly normal. Tricuspid valve regurgitation is trivial. No evidence of tricuspid stenosis. Aortic Valve: The aortic valve is grossly normal. Aortic valve regurgitation is not visualized. No aortic stenosis is present. Pulmonic Valve: The pulmonic valve was grossly normal. Pulmonic valve regurgitation is not visualized. No evidence of pulmonic stenosis. Aorta: The aortic root is  normal in size and structure. Venous: The inferior vena cava is normal in size with greater than 50% respiratory variability, suggesting right atrial pressure of 3 mmHg. IAS/Shunts: No atrial level shunt detected by color flow Doppler. Agitated saline contrast was given intravenously to evaluate for intracardiac shunting. Agitated saline contrast bubble  study was negative, with no evidence of any interatrial shunt.  LEFT VENTRICLE PLAX 2D LVIDd:         3.80 cm  Diastology LVIDs:         2.50 cm  LV e' lateral: 6.20 cm/s LV PW:         1.10 cm  LV e' medial:  4.57 cm/s LV IVS:        1.00 cm LVOT diam:     2.10 cm LV SV:         48 LV SV Index:   26 LVOT Area:     3.46 cm  RIGHT VENTRICLE RV S prime:     9.46 cm/s TAPSE (M-mode): 1.8 cm LEFT ATRIUM           Index       RIGHT ATRIUM           Index LA diam:      3.30 cm 1.78 cm/m  RA Area:     19.00 cm LA Vol (A4C): 50.4 ml 27.18 ml/m RA Volume:   57.90 ml  31.26 ml/m  AORTIC VALVE LVOT Vmax:   64.00 cm/s LVOT Vmean:  50.200 cm/s LVOT VTI:    0.140 m  AORTA Ao Root diam: 3.60 cm  SHUNTS Systemic VTI:  0.14 m Systemic Diam: 2.10 cm Eleonore Chiquito MD Electronically signed by Eleonore Chiquito MD Signature Date/Time: 07/01/2019/3:25:46 PM    Final     VAS [place] LOWER EXTREMITY VENOUS (DVT)   Result Date: 07/02/2019  Lower Venous DVTStudy Indications: Stroke.  Risk Factors: None identified. Comparison Study: No prior studies. Performing Technologist: Oliver Hum RVT  Examination Guidelines: A complete evaluation includes B-mode imaging, spectral Doppler, color Doppler, and power Doppler as needed of all accessible portions of each vessel. Bilateral testing is considered an integral part of a complete examination. Limited examinations for reoccurring indications may be performed as noted. The reflux portion of the exam is performed with the patient in reverse Trendelenburg.  +---------+---------------+---------+-----------+----------+--------------+ RIGHT     CompressibilityPhasicitySpontaneityPropertiesThrombus Aging +---------+---------------+---------+-----------+----------+--------------+ CFV      Full           Yes      Yes                                 +---------+---------------+---------+-----------+----------+--------------+ SFJ      Full                                                        +---------+---------------+---------+-----------+----------+--------------+ FV Prox  Full                                                        +---------+---------------+---------+-----------+----------+--------------+ FV Mid   Full                                                        +---------+---------------+---------+-----------+----------+--------------+ FV DistalFull                                                        +---------+---------------+---------+-----------+----------+--------------+  PFV      Full                                                        +---------+---------------+---------+-----------+----------+--------------+ POP      Full           Yes      Yes                                 +---------+---------------+---------+-----------+----------+--------------+ PTV      Full                                                        +---------+---------------+---------+-----------+----------+--------------+ PERO     Full                                                        +---------+---------------+---------+-----------+----------+--------------+   +---------+---------------+---------+-----------+----------+--------------+ LEFT     CompressibilityPhasicitySpontaneityPropertiesThrombus Aging +---------+---------------+---------+-----------+----------+--------------+ CFV      Full           Yes      Yes                                 +---------+---------------+---------+-----------+----------+--------------+ SFJ      Full                                                         +---------+---------------+---------+-----------+----------+--------------+ FV Prox  Full                                                        +---------+---------------+---------+-----------+----------+--------------+ FV Mid   Full                                                        +---------+---------------+---------+-----------+----------+--------------+ FV DistalFull                                                        +---------+---------------+---------+-----------+----------+--------------+ PFV      Full                                                        +---------+---------------+---------+-----------+----------+--------------+  POP      Full           Yes      Yes                                 +---------+---------------+---------+-----------+----------+--------------+ PTV      Full                                                        +---------+---------------+---------+-----------+----------+--------------+ PERO     Full                                                        +---------+---------------+---------+-----------+----------+--------------+     Summary: RIGHT: - There is no evidence of deep vein thrombosis in the lower extremity.  - No cystic structure found in the popliteal fossa.  LEFT: - There is no evidence of deep vein thrombosis in the lower extremity.  - No cystic structure found in the popliteal fossa.  *See table(s) above for measurements and observations. Electronically signed by Monica Martinez MD on 07/02/2019 at 8:09:03 PM.    Final        Assessment/Plan: Diagnosis:  posterior right basal ganglia right cerebral hemisphere and right cerebellum infarcts. Stroke: Continue secondary stroke prophylaxis and Risk Factor Modification listed below:   Antiplatelet therapy:  Blood Pressure Management:  Continue current medication with prn's with permisive HTN per primary team Statin Agent:   Prediabetes  management:   L>Rsided hemiparesis: fit for orthosis to prevent contractures (resting hand splint for day, wrist cock up splint at night, PRAFO, etc) PT/OT for mobility, ADL training  Labs independently reviewed.  Records reviewed and summated above.   1. Does the need for close, 24 hr/day medical supervision in concert with the patient's rehab needs make it unreasonable for this patient to be served in a less intensive setting? Yes 2. Co-Morbidities requiring supervision/potential complications: alcohol/tobacco abuse (counsel), hyperlipidemia (continue meds), HTN (monitor and provide prns in accordance with increased physical exertion and pain), prediabetes (Monitor in accordance with exercise and adjust meds as necessary), poststroke dysphagia (therapies, advance as tolerated), polycythemia (repeat labs, consider intervention necessary) 3. Due to bladder management, bowel management, safety, skin/wound care, disease management, pain management and patient education, does the patient require 24 hr/day rehab nursing? Yes 4. Does the patient require coordinated care of a physician, rehab nurse, therapy disciplines of PT/OT/SLP to address physical and functional deficits in the context of the above medical diagnosis(es)? Yes Addressing deficits in the following areas: balance, endurance, locomotion, strength, transferring, bowel/bladder control, bathing, dressing, feeding, grooming, toileting, cognition, speech, language, swallowing and psychosocial support 5. Can the patient actively participate in an intensive therapy program of at least 3 hrs of therapy per day at least 5 days per week? Potentially 6. The potential for patient to make measurable gains while on inpatient rehab is excellent 7. Anticipated functional outcomes upon discharge from inpatient rehab are min assist and mod assist  with PT, min assist and mod assist with OT, supervision with SLP. 8. Estimated rehab length of stay to reach the  above  functional goals is: 16-19 days. 9. Anticipated discharge destination: Home 10. Overall Rehab/Functional Prognosis: good   RECOMMENDATIONS: This patient's condition is appropriate for continued rehabilitative care in the following setting: CIR completion of medical work-up if caregiver support available upon discharge. Patient has agreed to participate in recommended program. Yes Note that insurance prior authorization may be required for reimbursement for recommended care.   Comment: Rehab Admissions Coordinator to follow up.   I have personally performed a face to face diagnostic evaluation, including, but not limited to relevant history and physical exam findings, of this patient and developed relevant assessment and plan.  Additionally, I have reviewed and concur with the physician assistant's documentation above.    Delice Lesch, MD, ABPMR Lavon Paganini Angiulli, PA-C 07/03/2019

## 2019-07-07 NOTE — Progress Notes (Signed)
Patient arrived to floor via bed accompanied by nursing staff and spouse with all patient belongings. Patient pleasant and talking with staff, no acute distress noted. Patient educated to floor routine, safety precautions, and call light use. Patient and spouse state they understand and call light placed within patient's reach. Bed alarm initiated.

## 2019-07-07 NOTE — PMR Pre-admission (Addendum)
PMR Admission Coordinator Pre-Admission Assessment  Patient: Russell Russell is an 69 y.o., male MRN: [id number] DOB: 1950/05/21 Height: 5\' 10"  (177.8 cm) Weight: 63.2 kg              Insurance Information HMO:     PPO:      PCP:      IPA:      80/20:      OTHER:  PRIMARY: Medicare A and B      Policy#: [id number]      Subscriber: pt CM Name:       Phone#:      Fax#:  Pre-Cert#: verified on [employer]      Employer:  Benefits:  Phone #:      Name:  Eff. Date: A 03/12/17, B 02/10/19     Deduct: $1484      Out of Pocket Max: n/a      Life Max: n/a  CIR: 100%      SNF: 20 full days Outpatient: 80%     Co-Pay: 20% Home Health: 100%      Co-Pay:  DME: 80%     Co-Pay: 20% Providers: pt choice SECONDARY:       Policy#:       Phone#:   Development worker, community:       Phone#:   The Therapist, art Information Summary" for patients in Inpatient Rehabilitation Facilities with attached "Privacy Act [place] Records" was provided and verbally reviewed with: Patient and Family  Emergency Contact Information Contact Information    Name Relation Home Work Mobile   [place] Spouse [id number]  [phone]     Current Medical History  Patient Admitting Diagnosis: posterior R basal ganglia, R cerebral hemisphere, R cerebellar infarcts  History of Present Illness: Russell Russell is a 69 y.o. right-handed male with history of alcohol/tobacco abuse, hyperlipidemia.  He presented in the late evening of 06/30/2019 with left hemiparesis.  Cranial CT unremarkable for acute intracranial abnormality.  Patient did not receive TPA.  CT angio of head and neck no emergent large vessel occlusion or high-grade stenosis.  MRI showed multiple scattered acute ischemic infarcts involving the posterior right basal ganglia right cerebral hemisphere and right cerebellum.  Echocardiogram with ejection fraction of 60%.  Lower extremity Dopplers negative for DVT.  Admission chemistries unremarkable, alcohol level 18,  SARS coronavirus negative.  Currently maintained on aspirin and Plavix for CVA prophylaxis.  Subcutaneous Lovenox for DVT prophylaxis.  Hospital course further complicated by dysphagia and started on dysphagia #2, thin liquid diet.  Therapy evaluations completed with recommendations for CIR.  Complete NIHSS TOTAL: 6 Glasgow Coma Scale Score: 15  Past Medical History  Past Medical History:  Diagnosis Date  . Alcohol abuse    drinks 1 gallon of Gin every weekend, nothing during the week x >15 yrs  . Elevated transaminase level    +elev bili: abd u/s 06/2014 showed stable small hepatic hemangiomas, o/w normal.  . Essential hypertension   . GERD (gastroesophageal reflux disease)   . Hyperlipidemia 03/2014   Atorv started-chol improved  . Impaired fasting glucose 03/2014  . Nephrolithiasis   . PUD (peptic ulcer disease)   . Tobacco dependence    chantix: "psych effects"    Family History  family history includes Breast cancer in his sister and sister; Cancer in his father; Colon cancer in his mother.  Prior Rehab/Hospitalizations:  Has the patient had prior rehab or hospitalizations prior to admission? No  Has the patient had major surgery during  100 days prior to admission? Yes  Current Medications   Current Facility-Administered Medications:  .  acetaminophen (TYLENOL) tablet 650 mg, 650 mg, Oral, Q4H PRN **OR** acetaminophen (TYLENOL) 160 MG/5ML solution 650 mg, 650 mg, Per Tube, Q4H PRN **OR** acetaminophen (TYLENOL) suppository 650 mg, 650 mg, Rectal, Q4H PRN, Allred, James, MD .  aspirin suppository 300 mg, 300 mg, Rectal, Daily **OR** aspirin tablet 325 mg, 325 mg, Oral, Daily, Allred, James, MD, 325 mg at 07/07/19 0913 .  atorvastatin (LIPITOR) tablet 40 mg, 40 mg, Oral, Daily, Allred, James, MD, 40 mg at 07/07/19 0915 .  chlorhexidine (PERIDEX) 0.12 % solution 15 mL, 15 mL, Mouth Rinse, BID, Allred, James, MD, 15 mL at 07/07/19 0913 .  clopidogrel (PLAVIX) tablet 75 mg, 75 mg,  Oral, Daily, Allred, James, MD, 75 mg at 07/07/19 0915 .  enoxaparin (LOVENOX) injection 40 mg, 40 mg, Subcutaneous, Q24H, Allred, James, MD, 40 mg at 07/06/19 2156 .  feeding supplement (ENSURE ENLIVE) (ENSURE ENLIVE) liquid 237 mL, 237 mL, Oral, BID BM, Allred, James, MD, 237 mL at 07/07/19 0916 .  folic acid injection 1 mg, 1 mg, Intravenous, Daily, Allred, James, MD, 1 mg at 07/07/19 0920 .  labetalol (NORMODYNE) injection 10 mg, 10 mg, Intravenous, Q2H PRN, Allred, James, MD, 10 mg at 07/02/19 2202 .  MEDLINE mouth rinse, 15 mL, Mouth Rinse, q12n4p, Allred, James, MD, 15 mL at 07/06/19 1543 .  metoprolol tartrate (LOPRESSOR) tablet 12.5 mg, 12.5 mg, Oral, BID, Elgergawy, Silver Huguenin, MD, 12.5 mg at 07/07/19 0914 .  multivitamin with minerals tablet 1 tablet, 1 tablet, Oral, Daily, Allred, James, MD, 1 tablet at 07/07/19 0915 .  nicotine (NICODERM CQ - dosed in mg/24 hours) patch 21 mg, 21 mg, Transdermal, Daily, Elgergawy, Silver Huguenin, MD, 21 mg at 07/07/19 0915 .  pantoprazole (PROTONIX) EC tablet 40 mg, 40 mg, Oral, Daily, Allred, James, MD, 40 mg at 07/07/19 0916 .  senna-docusate (Senokot-S) tablet 1 tablet, 1 tablet, Oral, QHS PRN, Allred, James, MD .  thiamine tablet 100 mg, 100 mg, Oral, Daily, 100 mg at 07/07/19 0914 **OR** thiamine (B-1) injection 100 mg, 100 mg, Intravenous, Daily, Allred, James, MD, 100 mg at 07/01/19 [id number]  Patients Current Diet:  Diet Order            DIET DYS 2 Room service appropriate? Yes with Assist; Fluid consistency: Thin  Diet effective now              Precautions / Restrictions Precautions Precautions: Fall, Other (comment) Precaution Comments: permissive HTN initially (<220/120) then normotension, L sided weakness, aspiration precautions (dysphagia 2 with thins), O2 sat >94% Restrictions Weight Bearing Restrictions: No   Has the patient had 2 or more falls or a fall with injury in the past year?No  Prior Activity Level Community (5-7x/wk):  recently retired from [employer], driving, getting out of the house daily after retirement, no AD used  Prior Functional Level Prior Function Level of Independence: Independent Comments: Independent without AD   Self Care: Did the patient need help bathing, dressing, using the toilet or eating?  Independent  Indoor Mobility: Did the patient need assistance with walking from room to room (with or without device)? Independent  Stairs: Did the patient need assistance with internal or external stairs (with or without device)? Independent  Functional Cognition: Did the patient need help planning regular tasks such as shopping or remembering to take medications? Independent  Home Assistive Devices / Equipment Home Assistive Devices/Equipment: None Home Equipment:  None  Prior Device Use: Indicate devices/aids used by the patient prior to current illness, exacerbation or injury? None of the above  Current Functional Level Cognition  Overall Cognitive Status: Within Functional Limits for tasks assessed Orientation Level: Oriented to person, Oriented to place, Oriented to situation, Disoriented to time General Comments: Follows directions with cues.    Extremity Assessment (includes Sensation/Coordination)  Upper Extremity Assessment: LUE deficits/detail LUE Deficits / Details: Improving strength to 3-/5 in L UE at all joints, AROM WFL LUE Sensation: WNL LUE Coordination: decreased fine motor, decreased gross motor  Lower Extremity Assessment: Defer to PT evaluation RLE Deficits / Details: >/=3/5 RLE Sensation: (intact to light touch) LLE Deficits / Details: grossly 2-/5 except for L foot 0/5 LLE Sensation: (intact to light touch) LLE Coordination: decreased gross motor, decreased fine motor    ADLs  Overall ADL's : Needs assistance/impaired Eating/Feeding: Sitting, Set up Grooming: Minimal assistance, Sitting, Wash/dry face Grooming Details (indicate cue type and reason): Assistance  to wipe mouth due to difficulty keeping secretions in mouth Upper Body Bathing: Sitting, Maximal assistance Lower Body Bathing: Total assistance, Bed level Upper Body Dressing : Maximal assistance, Sitting Upper Body Dressing Details (indicate cue type and reason): Max A to don hospital gown around back and front  Lower Body Dressing: Total assistance, Bed level Toilet Transfer: +2 for physical assistance Toilet Transfer Details (indicate cue type and reason): did not attempt, but suspect +2 physical assist required Toileting- Clothing Manipulation and Hygiene: Total assistance, Bed level General ADL Comments: Pt requires increased assistance for ADLs due to decreased strength in L UE/LE and core, decreased sitting balance, coordination, endurance     Mobility  Overal bed mobility: Needs Assistance Bed Mobility: Supine to Sit Supine to sit: Max assist, +2 for physical assistance, +2 for safety/equipment, HOB elevated Sit to supine: Max assist, +2 for physical assistance, +2 for safety/equipment, HOB elevated General bed mobility comments: Encouraged use of bedrail to assist. Improved ability to advance B LE to EOB    Transfers  Overall transfer level: Needs assistance Equipment used: None Transfer via Lift Equipment: Stedy Transfers: Sit to/from Stand Sit to Stand: Max assist, +2 physical assistance, +2 safety/equipment, From elevated surface, Mod assist Stand pivot transfers: Mod assist, Max assist General transfer comment: Use of Stedy for transfer bed>chair and sit<>stand from chair. Patient still with L and anterior lean. Cues and assist for weight shifting to right to reach midline.      Ambulation / Gait / Stairs / Wheelchair Mobility  Ambulation/Gait General Gait Details: not safe to attempt yet    Posture / Balance Dynamic Sitting Balance Sitting balance - Comments: Verbal, tactile, and visual cues for improved midline positioning. Patient sits with L and anterior lean. With  modA, patient is able to reach for foot rail of bed to assist with maintaining sitting balance. Balance Overall balance assessment: Needs assistance Sitting-balance support: Feet supported, Single extremity supported, Bilateral upper extremity supported Sitting balance-Leahy Scale: Poor Sitting balance - Comments: Verbal, tactile, and visual cues for improved midline positioning. Patient sits with L and anterior lean. With modA, patient is able to reach for foot rail of bed to assist with maintaining sitting balance. Postural control: Left lateral lean, Other (comment)(anterior lean) Standing balance support: Bilateral upper extremity supported Standing balance-Leahy Scale: Zero Standing balance comment: L lateral and anterior lean. Tactile, visual, and verbal cues for attempting to progress to midline positioning but patient unable to reach or maintain midline without assistance. Increased assistance  required as patient fatigues.    Special needs/care consideration Designated visitor spouse, Elphege Porras     Previous Home Environment (from acute therapy documentation) Living Arrangements: Spouse/significant other Available Help at Discharge: Family, Available PRN/intermittently(wife works but retiring in June) Type of Home: [place]: One level Home Access: Stairs to enter [employer] of Steps: 1(1 step into house ) Bathroom Shower/Tub: Chiropodist: [place]: No Additional Comments: Pt's wife reports they are planning to build a house (signed papework, getting walk in with grab bars, etc)  Discharge Living Setting Plans for Discharge Living Setting: Patient's home Type of Home at Discharge: [place]: One level Discharge Home Access: Stairs to enter Entrance Stairs-Rails: None Entrance Stairs-Number of Steps: 1+1 Discharge Bathroom Shower/Tub: Tub/shower unit Discharge Bathroom Toilet: Standard Discharge  Bathroom Accessibility: Yes How Accessible: Accessible via walker Does the patient have any problems obtaining your medications?: No  Social/Family/Support Systems Patient Roles: Spouse Anticipated Caregiver: spouse, [name] is retiring in June + will have arranged care for 24/7 from d/c until she retires Anticipated Ambulance person Information: [phone] Ability/Limitations of Caregiver: min assist Caregiver Availability: 24/7 Discharge Plan Discussed with Primary Caregiver: Yes Is Caregiver In Agreement with Plan?: Yes Does Caregiver/Family have Issues with Lodging/Transportation while Pt is in Rehab?: No   Goals Patient/Family Goal for Rehab: PT/OT/SLP min assist Expected length of stay: 21-24 days Pt/Family Agrees to Admission and willing to participate: Yes Program Orientation Provided & Reviewed with Pt/Caregiver Including Roles  & Responsibilities: Yes   Decrease burden of Care through IP rehab admission: n/a  Possible need for SNF placement upon discharge: Not anticipated   Patient Condition: This patient's medical and functional status has changed since the consult dated: 07/03/2019 in which the Rehabilitation Physician determined and documented that the patient's condition is appropriate for intensive rehabilitative care in an inpatient rehabilitation facility. See "History of Present Illness" (above) for medical update. Functional changes are: max +2 to transfer. Patient's medical and functional status update has been discussed with the Rehabilitation physician and patient remains appropriate for inpatient rehabilitation. Will admit to inpatient rehab today.  Preadmission Screen Completed By:  Michel Santee, PT, DPT 07/07/2019 10:07 AM ______________________________________________________________________   Discussed status with Dr. Dagoberto Ligas on 07/07/19 at  10:24 AM  and received approval for admission today.  Admission Coordinator:  Michel Santee, PT, DPT time  10:24 AM Sudie Grumbling 07/07/19

## 2019-07-08 ENCOUNTER — Inpatient Hospital Stay (HOSPITAL_COMMUNITY): Payer: BC Managed Care – PPO | Admitting: Occupational Therapy

## 2019-07-08 ENCOUNTER — Inpatient Hospital Stay (HOSPITAL_COMMUNITY): Payer: BC Managed Care – PPO

## 2019-07-08 ENCOUNTER — Inpatient Hospital Stay (HOSPITAL_COMMUNITY): Payer: BC Managed Care – PPO | Admitting: Speech Pathology

## 2019-07-08 DIAGNOSIS — D72819 Decreased white blood cell count, unspecified: Secondary | ICD-10-CM

## 2019-07-08 DIAGNOSIS — I10 Essential (primary) hypertension: Secondary | ICD-10-CM

## 2019-07-08 DIAGNOSIS — E8809 Other disorders of plasma-protein metabolism, not elsewhere classified: Secondary | ICD-10-CM

## 2019-07-08 DIAGNOSIS — I69391 Dysphagia following cerebral infarction: Secondary | ICD-10-CM

## 2019-07-08 LAB — CBC WITH DIFFERENTIAL/PLATELET
Abs Immature Granulocytes: 0.03 10*3/uL (ref 0.00–0.07)
Basophils Absolute: 0 10*3/uL (ref 0.0–0.1)
Basophils Relative: 1 %
Eosinophils Absolute: 0.1 10*3/uL (ref 0.0–0.5)
Eosinophils Relative: 4 %
HCT: 52.3 % — ABNORMAL HIGH (ref 39.0–52.0)
Hemoglobin: 18.1 g/dL — ABNORMAL HIGH (ref 13.0–17.0)
Immature Granulocytes: 1 %
Lymphocytes Relative: 22 %
Lymphs Abs: 0.8 10*3/uL (ref 0.7–4.0)
MCH: 32.2 pg (ref 26.0–34.0)
MCHC: 34.6 g/dL (ref 30.0–36.0)
MCV: 93.1 fL (ref 80.0–100.0)
Monocytes Absolute: 0.7 10*3/uL (ref 0.1–1.0)
Monocytes Relative: 18 %
Neutro Abs: 2.1 10*3/uL (ref 1.7–7.7)
Neutrophils Relative %: 54 %
Platelets: 242 10*3/uL (ref 150–400)
RBC: 5.62 MIL/uL (ref 4.22–5.81)
RDW: 16 % — ABNORMAL HIGH (ref 11.5–15.5)
WBC: 3.7 10*3/uL — ABNORMAL LOW (ref 4.0–10.5)
nRBC: 0 % (ref 0.0–0.2)

## 2019-07-08 LAB — COMPREHENSIVE METABOLIC PANEL
ALT: 19 U/L (ref 0–44)
AST: 32 U/L (ref 15–41)
Albumin: 2.8 g/dL — ABNORMAL LOW (ref 3.5–5.0)
Alkaline Phosphatase: 84 U/L (ref 38–126)
Anion gap: 12 (ref 5–15)
BUN: 17 mg/dL (ref 8–23)
CO2: 27 mmol/L (ref 22–32)
Calcium: 9.7 mg/dL (ref 8.9–10.3)
Chloride: 100 mmol/L (ref 98–111)
Creatinine, Ser: 1.14 mg/dL (ref 0.61–1.24)
GFR calc Af Amer: >60 mL/min (ref >60)
GFR calc non Af Amer: >60 mL/min (ref >60)
Glucose, Bld: 105 mg/dL — ABNORMAL HIGH (ref 70–99)
Potassium: 4.1 mmol/L (ref 3.5–5.1)
Sodium: 139 mmol/L (ref 135–145)
Total Bilirubin: 1.3 mg/dL — ABNORMAL HIGH (ref 0.3–1.2)
Total Protein: 7.2 g/dL (ref 6.5–8.1)

## 2019-07-08 NOTE — Evaluation (Signed)
Physical Therapy Assessment and Plan  Patient Details  Name: Russell Russell MRN: [id number] Date of Birth: 1950/06/01  PT Diagnosis: Abnormal posture, Abnormality of gait, Coordination disorder, Difficulty walking and Hemiparesis non-dominant Rehab Potential: Fair ELOS: 3-4 weeks   Today's Date: 07/08/2019 PT Individual Time: 2952-8413 PT Individual Time Calculation (min): 59 min    Problem List:  Patient Active Problem List   Diagnosis Date Noted  . Cerebrovascular accident (CVA) of right basal ganglia ([place]) 07/07/2019  . Acute ischemic stroke ([place])   . Alcohol abuse   . Dyslipidemia   . Dysphagia, post-stroke   . Polycythemia   . Stroke ([place]) 06/30/2019  . Prediabetes 12/23/2016  . Elevated LFTs 12/23/2016  . Pure hypercholesterolemia 12/21/2016  . Alcoholism ([place]) 12/21/2016  . Eczema 09/18/2013  . Essential hypertension 09/18/2013  . Gastroesophageal reflux disease without esophagitis 09/18/2013  . Tobacco abuse 09/18/2013    Past Medical History:  Past Medical History:  Diagnosis Date  . Alcohol abuse    drinks 1 gallon of Gin every weekend, nothing during the week x >15 yrs  . Elevated transaminase level    +elev bili: abd u/s 06/2014 showed stable small hepatic hemangiomas, o/w normal.  . Essential hypertension   . GERD (gastroesophageal reflux disease)   . Hyperlipidemia 03/2014   Atorv started-chol improved  . Impaired fasting glucose 03/2014  . Nephrolithiasis   . PUD (peptic ulcer disease)   . Tobacco dependence    chantix: "psych effects"   Past Surgical History:  Past Surgical History:  Procedure Laterality Date  . COLONOSCOPY  2005   Recall 10 yrs ([place])  . LOOP RECORDER INSERTION N/A 07/03/2019   Procedure: LOOP RECORDER INSERTION;  Surgeon: Thompson Grayer, MD;  Location: [facility];  Service: Cardiovascular;  Laterality: N/A;  . removal of kidney stones  2006   cystoscopic--removed from ureter.  No prob since.    Assessment &  Plan Clinical Impression: Patient is a 69 y.o. year old male with history of alcohol/tobacco abuse, hyperlipidemia.He presentedin the late evening of4/20/2021 with left hemiparesis.Cranial CTunremarkable for acute intracranial abnormality. Patient did not receive TPA. CT angio of head and neck no emergent large vessel occlusion or high-grade stenosis. MRI showed multiple scattered acute ischemic infarcts involving the posterior right basal ganglia right cerebral hemisphere and right cerebellum. Echocardiogram with ejection fraction of 60%.Lower extremity Dopplers negative for DVT. Admission chemistries unremarkable, alcohol level 18, SARS coronavirus negative. Currently maintained on aspirin and Plavix for CVA prophylaxis. Subcutaneous Lovenox for DVT prophylaxis. Hospital course further complicated by dysphagia and started on dysphagia #2, thinliquid diet. Therapy evaluations completed with recommendations for CIR.  Patient currently requires total with mobility secondary to muscle weakness, impaired timing and sequencing, unbalanced muscle activation, decreased coordination and decreased motor planning and decreased sitting balance, decreased standing balance, decreased postural control, hemiplegia and decreased balance strategies.  Prior to hospitalization, patient was independent  with mobility and lived with Spouse in a House home.  Home access is 1Stairs to enter.  Patient will benefit from skilled PT intervention to maximize safe functional mobility, minimize fall risk and decrease caregiver burden for planned discharge home with 24 hour assist.  Anticipate patient will benefit from follow up [facility] at discharge.  PT - End of Session Activity Tolerance: Tolerates 30+ min activity with multiple rests Endurance Deficit: Yes Endurance Deficit Description: required multiple rest breaks throughout session PT Assessment Rehab Potential (ACUTE/IP ONLY): Fair PT Barriers to Discharge: Other  (comments) PT Barriers  to Discharge Comments: L hemiparesis/pushing PT Patient demonstrates impairments in the following area(s): Balance;Endurance;Motor;Perception;Safety PT Transfers Functional Problem(s): Bed Mobility;Bed to Chair;Car;Furniture PT Locomotion Functional Problem(s): Ambulation;Wheelchair Mobility;Stairs PT Plan PT Intensity: Minimum of 1-2 x/day ,45 to 90 minutes PT Frequency: 5 out of 7 days PT Duration Estimated Length of Stay: 3-4 weeks PT Treatment/Interventions: Ambulation/gait training;Discharge planning;Functional mobility training;Psychosocial support;Therapeutic Activities;Balance/vestibular training;Disease management/prevention;Neuromuscular re-education;Therapeutic Exercise;Wheelchair propulsion/positioning;Cognitive remediation/compensation;DME/adaptive equipment instruction;Splinting/orthotics;UE/LE Strength taining/ROM;Pain management;Community reintegration;Functional electrical stimulation;Patient/family education;Stair training;UE/LE Coordination activities PT Transfers Anticipated Outcome(s): min A with LRAD PT Locomotion Anticipated Outcome(s): min A with LRAD PT Recommendation Recommendations for Other Services: Therapeutic Recreation consult Therapeutic Recreation Interventions: Outing/community reintergration;Stress management Follow Up Recommendations: Home health PT Patient destination: Home Equipment Recommended: To be determined Equipment Details: has none  Skilled Therapeutic Intervention Evaluation completed (see details above and below) with education on PT POC and goals and individual treatment initiated with focus on functional mobility/transfers, LE strength, dynamic sitting/standing balance/coordination, midline orientation, NMR, pre-gait training, and improved endurance with activity. Received pt supine in bed with wife present at bedside, pt educated on PT evaluation, CIR policies, and therapy schedule and agreeable. Pt donned pants in supine  with mod A and rolled to L and R with mod A and use of bedrails. Pt transferred supine<>sitting EOB with max A with HOB elevated and use of bedrails x 2 trials. Pt initially requiring max A fading to CGA to maintain static sitting balance EOB. Pt with severe forward head, anterior lean, and pushing to L. Pt required max verbal and tactile cues to achieve midline and only able to maintain position briefly once therapist discontinued assist. Pt transferred sit<>stand with mod A +2 x 3 trials throughout session with pt frequently returning to sitting position without warning. Pt transferred stand<>pivot bed<>TIS WC with max A +2. Pt with increased L knee buckling, anterior lean and forward head, and difficulty advancing L LE. Concluded session with pt semi-reclined in TIS WC, needs within reach, and seatbelt alarm on. Safety plan updated.   PT Evaluation Precautions/Restrictions Precautions Precautions: Fall;Other (comment) Precaution Comments: permissive HTN initially (<220/120) then normotension, L sided weakness, aspiration precautions (dysphagia 2 with thins), O2 sat >94%; L hemiparesis Restrictions Weight Bearing Restrictions: No Home Living/Prior Functioning Home Living Living Arrangements: Spouse/significant other Available Help at Discharge: Family;Available 24 hours/day(wife is retiring in June but pt's siter is able to help out) Type of Home: House Home Access: Stairs to enter [employer] of Steps: 1 Entrance Stairs-Rails: None Home Layout: One level Bathroom Shower/Tub: Chiropodist: Standard Bathroom Accessibility: Yes Additional Comments: Pt's wife reports they are planning to build a house (signed papework, getting walk in with grab bars, etc)  Lives With: Spouse Prior Function Level of Independence: Independent with basic ADLs;Independent with transfers;Independent with homemaking with ambulation;Independent with gait  Able to Take Stairs?:  Yes Driving: Yes Vocation: Retired Associate Professor Overall Cognitive Status: Impaired/Different from baseline Arousal/Alertness: Awake/alert Orientation Level: Oriented to person;Oriented to place;Oriented to situation;Disoriented to time Memory: Impaired Awareness: Impaired Problem Solving: Impaired Safety/Judgment: Impaired Sensation Sensation Light Touch: Impaired by gross assessment Proprioception: Impaired by gross assessment Additional Comments: decreased along L great toe Coordination Gross Motor Movements are Fluid and Coordinated: No Fine Motor Movements are Fluid and Coordinated: No Coordination and Movement Description: grossly uncoordinated due to L hemiparesis/pushing, decreased sitting and standing balance/midline orientation, poor head control, generalized weakness, and fatigue Finger Nose Finger Test: WFL on R UE, dysmetria on L UE and compensating by using R UE to  assist Heel Shin Test: slightly decreased on R LE, unable to perform on L LE due to weakness Motor  Motor Motor: Hemiplegia;Abnormal postural alignment and control Motor - Skilled Clinical Observations: grossly uncoordinated due to L hemiparesis/pushing, decreased sitting and standing balance/midline orientation, poor head control, generalized weakness, and fatigue  Mobility Bed Mobility Bed Mobility: Rolling Right;Rolling Left;Supine to Sit;Sit to Supine Rolling Right: Moderate Assistance - Patient 50-74%(bed features) Rolling Left: Moderate Assistance - Patient 50-74%(bed features) Supine to Sit: Maximal Assistance - Patient - Patient 25-49%(bed features) Sit to Supine: Moderate Assistance - Patient 50-74%(bed features) Transfers Transfers: Sit to Stand;Stand to Sit;Stand Pivot Transfers Sit to Stand: 2 Helpers Stand to Sit: Maximal Assistance - Patient 25-49% Stand Pivot Transfers: 2 Helpers Transfer (Assistive device): None Trunk/Postural Assessment  Cervical Assessment Cervical Assessment: Exceptions  to WFL(severe forward head posture) Thoracic Assessment Thoracic Assessment: Exceptions to WFL(kyphosis) Lumbar Assessment Lumbar Assessment: Exceptions to WFL(posterior pelvic tilt) Postural Control Postural Control: Deficits on evaluation Head Control: unable to achieve neutral position Trunk Control: pushing to L Righting Reactions: decreased; strong L lateral lean Protective Responses: decreased; frequently falling anteriorly and to the L  Balance Balance Balance Assessed: Yes Static Sitting Balance Static Sitting - Balance Support: Bilateral upper extremity supported;Feet supported Static Sitting - Level of Assistance: 2: Max assist Dynamic Sitting Balance Dynamic Sitting - Balance Support: Bilateral upper extremity supported;Feet supported Dynamic Sitting - Level of Assistance: 2: Max assist Sitting balance - Comments: Verbal, tactile, and visual cues for improved midline positioning. Patient sits with L lateral and anterior lean. Static Standing Balance Static Standing - Balance Support: Bilateral upper extremity supported Static Standing - Level of Assistance: 1: +2 Total assist Extremity Assessment  RLE Assessment RLE Assessment: Exceptions to [facility] General Strength Comments: grossly generalized to 4-/5 LLE Assessment LLE Assessment: Exceptions to [facility] LLE Strength Left Hip Flexion: 3/5 Left Hip ABduction: 3+/5 Left Hip ADduction: 3+/5 Left Knee Flexion: 3/5 Left Knee Extension: 3-/5 Left Ankle Dorsiflexion: 2/5 Left Ankle Plantar Flexion: 3-/5  Refer to Care Plan for Long Term Goals  Recommendations for other services: Therapeutic Recreation  Stress management and Outing/community reintegration  Discharge Criteria: Patient will be discharged from PT if patient refuses treatment 3 consecutive times without medical reason, if treatment goals not met, if there is a change in medical status, if patient makes no progress towards goals or if patient is discharged from  hospital.  The above assessment, treatment plan, treatment alternatives and goals were discussed and mutually agreed upon: by patient and by family  Alfonse Alpers PT, DPT  07/08/2019, 7:39 AM

## 2019-07-08 NOTE — Progress Notes (Signed)
[facility] PHYSICAL MEDICINE & REHABILITATION PROGRESS NOTE  Subjective/Complaints: Patient seen sitting up in bed this morning eating breakfast, he states he slept well overnight.  He states he is ready begin therapies.  ROS: Denies CP, shortness of breath, nausea, vomiting, diarrhea.  Objective: Vital Signs: Blood pressure (!) 143/110, pulse 84, temperature 98.1 F (36.7 C), temperature source Oral, resp. rate 18, weight 58.3 kg, SpO2 96 %. No results found. Recent Labs    07/07/19 0352 07/08/19 0708  WBC 4.5 3.7*  HGB 17.5* 18.1*  HCT 50.6 52.3*  PLT 245 242   Recent Labs    07/07/19 0352 07/08/19 0708  NA 137 139  K 3.9 4.1  CL 102 100  CO2 25 27  GLUCOSE 107* 105*  BUN 20 17  CREATININE 1.02 1.14  CALCIUM 9.3 9.7    Physical Exam: BP (!) 143/110 (BP Location: Left Arm)   Pulse 84   Temp 98.1 F (36.7 C) (Oral)   Resp 18   Wt 58.3 kg   SpO2 96%   BMI 18.44 kg/m  Constitutional: No distress . Vital signs reviewed. HENT: Normocephalic.  Atraumatic.  Poor dentition. Eyes: EOMI. No discharge. Cardiovascular: No JVD. Respiratory: Normal effort.  No stridor. GI: Non-distended. Skin: Warm and dry.  Intact. Psych: Normal mood.  Normal behavior. Musc: No edema in extremities.  No tenderness in extremities. Neuro: Alert and oriented to person and place Motor: RUE: 5/5 proximal distal LUE: 4/5 proximal distal RLE: 5/5 proximal distal LLE: Hip flexion, knee extension 2+/5, ankle dorsiflexion 3+/5 Dysarthria   Assessment/Plan: 1. Functional deficits secondary to multiple anterior and posterior right brain infarcts which require 3+ hours per day of interdisciplinary therapy in a comprehensive inpatient rehab setting.  Physiatrist is providing close team supervision and 24 hour management of active medical problems listed below.  Physiatrist and rehab team continue to assess barriers to discharge/monitor patient progress toward functional and medical  goals  Care Tool:  Bathing              Bathing assist       Upper Body Dressing/Undressing Upper body dressing   What is the patient wearing?: Pull over shirt    Upper body assist Assist Level: Dependent - Patient 0%    Lower Body Dressing/Undressing Lower body dressing      What is the patient wearing?: Incontinence brief     Lower body assist Assist for lower body dressing: Dependent - Patient 0%     Toileting Toileting    Toileting assist Assist for toileting: Dependent - Patient 0%     Transfers Chair/bed transfer  Transfers assist           Locomotion Ambulation   Ambulation assist              Walk 10 feet activity   Assist           Walk 50 feet activity   Assist           Walk 150 feet activity   Assist           Walk 10 feet on uneven surface  activity   Assist           Wheelchair     Assist               Wheelchair 50 feet with 2 turns activity    Assist            Wheelchair 150 feet activity  Assist            Medical Problem List and Plan: 1.  Left side weakness secondary to multiple scattered anterior and posterior right brain infarcts.  Status post loop recorder  Begin CIR evaluations  Team conference today to discuss current and goals and coordination of care, home and environmental barriers, and discharge planning with nursing, case manager, and therapies.  2.  Antithrombotics: -DVT/anticoagulation: Lovenox             -antiplatelet therapy: Aspirin 325 mg daily and Plavix 75 mg daily x3 months then aspirin alone 3. Pain Management: Tylenol as needed 4. Mood: Provide emotional support             -antipsychotic agents: N/A 5. Neuropsych: This patient is?  Fully capable of making decisions on his own behalf. 6. Skin/Wound Care: Routine skin checks 7. Fluids/Electrolytes/Nutrition: Routine in and outs.  BMP within acceptable range on 4/28 8.   Hypertension.  Lopressor 12.5 mg twice daily.    Monitor with increased mobility 9.  Hyperlipidemia.  Lipitor 10.  Dysphagia.  Dysphagia #2 thin liquids.  Follow-up speech therapy  Advance diet as tolerated 11.  History of tobacco and alcohol use.  NicoDerm patch.  Provide counseling.    Monitor for withdrawal 12.  Moderate hypoalbuminemia  Supplement initiated on 4/28 13. Leukopenia  WBCs 3/7 on 4/28  Cont to monitor  LOS: 1 days A FACE TO FACE EVALUATION WAS PERFORMED  Ankit Lorie Phenix 07/08/2019, 8:22 AM

## 2019-07-08 NOTE — Patient Care Conference (Signed)
Inpatient RehabilitationTeam Conference and Plan of Care Update Date: 07/08/2019   Time: 11:45 AM    Patient Name: Russell Russell      Medical Record Number: [id number]  Date of Birth: 1951/02/04 Sex: Male         Room/Bed: 4M01C/4M01C-01 Payor Info: Payor: MEDICARE / Plan: MEDICARE PART A AND B / Product Type: *No Product type* /    Admit Date/Time:  07/07/2019  3:39 PM  Primary Diagnosis:  Cerebrovascular accident (CVA) of right basal ganglia [facility])  Patient Active Problem List   Diagnosis Date Noted  . Leukopenia   . Benign essential HTN   . Hypoalbuminemia   . Cerebrovascular accident (CVA) of right basal ganglia ([place]) 07/07/2019  . Acute ischemic stroke ([place])   . Alcohol abuse   . Dyslipidemia   . Dysphagia, post-stroke   . Polycythemia   . Stroke ([place]) 06/30/2019  . Prediabetes 12/23/2016  . Elevated LFTs 12/23/2016  . Pure hypercholesterolemia 12/21/2016  . Alcoholism ([place]) 12/21/2016  . Eczema 09/18/2013  . Essential hypertension 09/18/2013  . Gastroesophageal reflux disease without esophagitis 09/18/2013  . Tobacco abuse 09/18/2013    Expected Discharge Date: Expected Discharge Date: (TBD)  Team Members Present: Physician leading conference: Dr. Delice Lesch Care Coodinator Present: Nestor Lewandowsky, RN, BSN, CRRN;Christina Sampson Goon, BSW;Genie Ignacio Lowder, RN, MSN Nurse Present: Ellison Carwin, LPN PT Present: Becky Sax, PT OT Present: Mariane Masters, OT SLP Present: Weston Anna, SLP PPS Coordinator present : Ileana Ladd, [name], SLP     Current Status/Progress Goal Weekly Team Focus  Bowel/Bladder   incontinent of bowel and bladder  train bowel and bladder  assess q shift/prn   Swallow/Nutrition/ Hydration   Eval Pending         ADL's   Eval pending        Mobility   bed mobility max A, sit<>stand mod A +2, stand<>pivot max A +2  min A  functional mobility/transfers, LE strength, NMR, motor control, dynamic sitting/standing balance,  pre-gait training, endurance   Communication   Eval Pending         Safety/Cognition/ Behavioral Observations  Eval Pending         Pain   no c/o pain  remain pain free  assess q shift/prn   Skin   eczema BUE, loop recorder dressing  no further breakdown/infection  assess q shift/prn    Rehab Goals Patient on target to meet rehab goals: Yes Rehab Goals Revised: On target with goals currently *See Care Plan and progress notes for long and short-term goals.     Barriers to Discharge  Current Status/Progress Possible Resolutions Date Resolved   Nursing                  PT  Other (comments)  L hemiparesis/pushing              OT                  SLP                SW Medical stability   on target          Discharge Planning/Teaching Needs:  Goal to discharge home  Will schedule if reccommended   Team Discussion: MD monitoring HTN, low albumin, leukopenia, monitoring labs.  RN inc B/B.  OT eval pending.  PT max bed, mod +2 stand, max +2 pivot, min a goals.  Wife here for eval.  SLP D2thins, mod a dysarthria, mod  a cog impairment, poor awareness.   Revisions to Treatment Plan: N/A     Medical Summary Current Status: Left side weakness secondary to multiple scattered anterior and posterior right brain infarcts. Weekly Focus/Goal: Improve mobility, BP, swallowing, leukopenia  Barriers to Discharge: Medical stability;Decreased family/caregiver support;Incontinence   Possible Resolutions to Barriers: Therapies, follow labs, advance diet as tolerated, optimize BP meds   Continued Need for Acute Rehabilitation Level of Care: The patient requires daily medical management by a physician with specialized training in physical medicine and rehabilitation for the following reasons: Direction of a multidisciplinary physical rehabilitation program to maximize functional independence : Yes Medical management of patient stability for increased activity during participation in an  intensive rehabilitation regime.: Yes Analysis of laboratory values and/or radiology reports with any subsequent need for medication adjustment and/or medical intervention. : Yes   I attest that I was present, lead the team conference, and concur with the assessment and plan of the team.   Jodell Cipro M 07/09/2019, 11:22 AM   Team conference was held via web/ teleconference due to [place] - 19

## 2019-07-08 NOTE — Progress Notes (Signed)
Occupational Therapy Session Note  Patient Details  Name: Russell Russell MRN: [id number] Date of Birth: 1951-02-01  Today's Date: 07/08/2019 OT Individual Time: 0300-0330 OT Individual Time Calculation (min): 30 min    Short Term Goals: Week 1:  OT Short Term Goal 1 (Week 1): Pt will transfer to drop arm BSC with mod A +2 with LRAD/ method OT Short Term Goal 2 (Week 1): Pt will perform sit to stand with max cues with mod A in prep for clothing management OT Short Term Goal 3 (Week 1): Pt will be able to maintain static sitting balance in prep for ADL tasks with consistant mod A OT Short Term Goal 4 (Week 1): Pt will incorporate left UE in functional tasks with minimal cuing  Skilled Therapeutic Interventions/Progress Updates:  Pt received seated in TIS with wife present agreeable to OT intervention. Pt request to return to bed at start of session. Pt required MAX A for SB transfer to [name] side from TIS>EOB. Pt pushing heavily to L needing MAX A to maintain upright posture during transfer. Remainder of session to focus on sitting balance EOB. Pt sitting balance fluctuated from MIN- MAX A with L lateral lean. Pt initially responded well to positioning BUEs posteriorly however pt trunk began to lower after ~ 30 secs needing MAX facilitation at chest and MAX verbal cues to elevate head and trunk. Worked on functional reach with R hand to facilitate weight shifting onto LUE with pt immediatly losing balance laterally and to left needing MAX A to self correct. Pt able to sit EOB ~ 10 mins total. Pt returned to supine with MOD A to elevate LLE into bed. Instructed pt to tie shoe to facilitate bilateral integration however pt attempt to complete task using RUE only. Education provided on L inattention and using LUE during all tasks as LUE FMC appears intact with pt able to oppose all fingers to thumb. Pt left supine in bed with all needs within reach, bed alarm activated and wife present.   Therapy  Documentation Precautions:  Precautions Precautions: Fall, Other (comment) Precaution Comments: permissive HTN initially (<220/120) then normotension, L sided weakness, aspiration precautions (dysphagia 2 with thins), ; L hemiparesis Restrictions Weight Bearing Restrictions: No General: General Chart Reviewed: Yes Family/Caregiver Present: Yes Vital Signs:  Pain: Pt reports no pain during session.   Therapy/Group: Individual Therapy  Ihor Gully 07/08/2019, 3:55 PM

## 2019-07-08 NOTE — Progress Notes (Signed)
Social Work Assessment and Plan   Patient Details  Name: Russell Russell MRN: [id number] Date of Birth: 06/17/1950  Today's Date: 07/08/2019  Problem List:  Patient Active Problem List   Diagnosis Date Noted  . Leukopenia   . Benign essential HTN   . Hypoalbuminemia   . Cerebrovascular accident (CVA) of right basal ganglia ([place]) 07/07/2019  . Acute ischemic stroke ([place])   . Alcohol abuse   . Dyslipidemia   . Dysphagia, post-stroke   . Polycythemia   . Stroke ([place]) 06/30/2019  . Prediabetes 12/23/2016  . Elevated LFTs 12/23/2016  . Pure hypercholesterolemia 12/21/2016  . Alcoholism ([place]) 12/21/2016  . Eczema 09/18/2013  . Essential hypertension 09/18/2013  . Gastroesophageal reflux disease without esophagitis 09/18/2013  . Tobacco abuse 09/18/2013   Past Medical History:  Past Medical History:  Diagnosis Date  . Alcohol abuse    drinks 1 gallon of Gin every weekend, nothing during the week x >15 yrs  . Elevated transaminase level    +elev bili: abd u/s 06/2014 showed stable small hepatic hemangiomas, o/w normal.  . Essential hypertension   . GERD (gastroesophageal reflux disease)   . Hyperlipidemia 03/2014   Atorv started-chol improved  . Impaired fasting glucose 03/2014  . Nephrolithiasis   . PUD (peptic ulcer disease)   . Tobacco dependence    chantix: "psych effects"   Past Surgical History:  Past Surgical History:  Procedure Laterality Date  . COLONOSCOPY  2005   Recall 10 yrs ([place])  . LOOP RECORDER INSERTION N/A 07/03/2019   Procedure: LOOP RECORDER INSERTION;  Surgeon: Thompson Grayer, MD;  Location: [facility];  Service: Cardiovascular;  Laterality: N/A;  . removal of kidney stones  2006   cystoscopic--removed from ureter.  No prob since.   Social History:  reports that he has been smoking cigarettes. He has a 10.00 pack-year smoking history. He has never used smokeless tobacco. He reports current alcohol use of about 8.0 standard drinks of  alcohol per week. He reports that he does not use drugs.  Family / Support Systems Marital Status: Married How Long?: 18 Years Patient Roles: Spouse Children: 2 Children Anticipated Caregiver: Spouse Ability/Limitations of Caregiver: N/a Caregiver Availability: 24/7  Social History Preferred language: English Religion:  Education: Graduated Read: Yes Write: Yes Employment Status: Retired   Abuse/Neglect Abuse/Neglect Assessment Can Be Completed: Yes Physical Abuse: Denies Verbal Abuse: Denies Sexual Abuse: Denies Exploitation of patient/patient's resources: Denies Self-Neglect: Denies  Emotional Status Pt's affect, behavior and adjustment status: N/a Psychiatric History: N/a Substance Abuse History: N/a  Patient / Family Perceptions, Expectations & Goals Pt/Family understanding of illness & functional limitations: Yes Pt/family expectations/goals: Goal is to discharge home with spouse  [employer]: None Premorbid Home Care/DME Agencies: None Transportation available at discharge: Spouse able to transport at discharge (has suv)  Discharge Planning Living Arrangements: Spouse/significant other [place]: Spouse/significant other, Children Type of Residence: Private residence(1 Level Home, 1 Step to enter) Insurance Resources: Chartered certified accountant Resources: Radio broadcast assistant Screen Referred: Yes Living Expenses: Rent Money Management: Spouse Does the patient have any problems obtaining your medications?: No Social Work Anticipated Follow Up Needs: HH/OP  Clinical Impression SW entered to room to meet patient. Patient spouse at bed side. SW introduced self and explained role. Patient pleasant.   Dyanne Iha 07/08/2019, 10:23 AM

## 2019-07-08 NOTE — Care Management (Signed)
The [facility] Individual Statement of Services  Patient Name:  Russell Russell  Date:  07/08/2019  Welcome to the [facility].  Our goal is to provide you with an individualized program based on your diagnosis and situation, designed to meet your specific needs.  With this comprehensive rehabilitation program, you will be expected to participate in at least 3 hours of rehabilitation therapies Monday-Friday, with modified therapy programming on the weekends.  Your rehabilitation program will include the following services:  Physical Therapy (PT), Occupational Therapy (OT), Speech Therapy (ST), 24 hour per day rehabilitation nursing, Case Management (Social Worker), Rehabilitation Medicine, Nutrition Services, Pharmacy Services and Other  Weekly team conferences will be held on Wednesdays to discuss your progress.  Your Social Worker will talk with you frequently to get your input and to update you on team discussions.  Team conferences with you and your family in attendance may also be held.  Expected length of stay: 3-4 Weeks  Overall anticipated outcome: Min assist  Depending on your progress and recovery, your program may change. Your Social Worker will coordinate services and will keep you informed of any changes. Your Social Worker's name and contact numbers are listed  below.  The following services may also be recommended but are not provided by the [facility]:    [facility] will be made to provide these services after discharge if needed.  Arrangements include referral to agencies that provide these services.  Your insurance has been verified to be:  Medicare Your primary doctor is:  Inda Coke  Pertinent information will be shared with your doctor and your insurance company.  Social Worker: Erlene Quan, [place] or ([phone]   Information discussed with and copy given to patient by: Dyanne Iha, 07/08/2019, 2:00 PM

## 2019-07-08 NOTE — Evaluation (Signed)
Occupational Therapy Assessment and Plan  Patient Details  Name: Russell Russell MRN: [id number] Date of Birth: 1950/12/24  OT Diagnosis: abnormal posture, apraxia, cognitive deficits, hemiplegia affecting non-dominant side and muscle weakness (generalized) Rehab Potential: Rehab Potential (ACUTE ONLY): Good ELOS: ~3.5-4 weeks   Today's Date: 07/08/2019 OT Individual Time: 1300-1400 OT Individual Time Calculation (min): 60 min     Problem List:  Patient Active Problem List   Diagnosis Date Noted  . Leukopenia   . Benign essential HTN   . Hypoalbuminemia   . Cerebrovascular accident (CVA) of right basal ganglia ([place]) 07/07/2019  . Acute ischemic stroke ([place])   . Alcohol abuse   . Dyslipidemia   . Dysphagia, post-stroke   . Polycythemia   . Stroke ([place]) 06/30/2019  . Prediabetes 12/23/2016  . Elevated LFTs 12/23/2016  . Pure hypercholesterolemia 12/21/2016  . Alcoholism ([place]) 12/21/2016  . Eczema 09/18/2013  . Essential hypertension 09/18/2013  . Gastroesophageal reflux disease without esophagitis 09/18/2013  . Tobacco abuse 09/18/2013    Past Medical History:  Past Medical History:  Diagnosis Date  . Alcohol abuse    drinks 1 gallon of Gin every weekend, nothing during the week x >15 yrs  . Elevated transaminase level    +elev bili: abd u/s 06/2014 showed stable small hepatic hemangiomas, o/w normal.  . Essential hypertension   . GERD (gastroesophageal reflux disease)   . Hyperlipidemia 03/2014   Atorv started-chol improved  . Impaired fasting glucose 03/2014  . Nephrolithiasis   . PUD (peptic ulcer disease)   . Tobacco dependence    chantix: "psych effects"   Past Surgical History:  Past Surgical History:  Procedure Laterality Date  . COLONOSCOPY  2005   Recall 10 yrs ([place])  . LOOP RECORDER INSERTION N/A 07/03/2019   Procedure: LOOP RECORDER INSERTION;  Surgeon: Thompson Grayer, MD;  Location: [facility];  Service: Cardiovascular;  Laterality: N/A;   . removal of kidney stones  2006   cystoscopic--removed from ureter.  No prob since.    Assessment & Plan Clinical Impression: Patient is a 69 y.o. year old male with history of alcohol/tobacco abuse, hyperlipidemia.  Per chart review and wife due to memory difficulties patient lives with spouse currently working but planning on retiring in June 2021.  1 level home one-step to entry.  Reportedly independent prior to admission.  He presented in the late evening of 06/30/2019 with left hemiparesis.  Cranial CT scan unremarkable for acute intracranial process.  Patient did not receive TPA.  CT angiogram head and neck no emergent large vessel occlusion or high-grade stenosis.  MRI showed multiple scattered acute ischemic infarcts involving the posterior right basal ganglia right cerebral hemisphere and right cerebellum.  Echocardiogram with ejection fraction of 60%.  No source of embolus.  Lower extremity Dopplers negative.  Admission chemistries unremarkable, alcohol level 18, SARS coronavirus negative.  Currently maintained on aspirin 325 mg and Plavix for CVA prophylaxis x3 months then aspirin alone.  Patient did undergo loop recorder placement.  Subcutaneous Lovenox for DVT prophylaxis.  He is on a dysphagia #2 thin liquid diet.  Patient transferred to CIR on 07/07/2019 .    Patient currently requires max to total A with basic self-care skills and mobility  secondary to muscle weakness, decreased cardiorespiratoy endurance, impaired timing and sequencing, abnormal tone, unbalanced muscle activation, motor apraxia, decreased coordination and decreased motor planning, decreased visual perceptual skills, decreased midline orientation, decreased attention to left and decreased motor planning, decreased attention, decreased  awareness, decreased problem solving, decreased safety awareness and decreased memory and decreased sitting balance, decreased standing balance, decreased postural control, hemiplegia and  decreased balance strategies.  Prior to hospitalization, patient could complete ADL with independent .  Patient will benefit from skilled intervention to decrease level of assist with basic self-care skills and increase independence with basic self-care skills prior to discharge home with care partner.  Anticipate patient will require 24 hour supervision and minimal physical assistance and follow up outpatient.  OT - End of Session Activity Tolerance: Tolerates 30+ min activity with multiple rests Endurance Deficit: Yes Endurance Deficit Description: required multiple rest breaks throughout session OT Assessment Rehab Potential (ACUTE ONLY): Good OT Patient demonstrates impairments in the following area(s): Balance;Cognition;Edema;Endurance;Motor;Pain;Perception;Safety;Sensory OT Basic ADL's Functional Problem(s): Grooming;Bathing;Dressing;Toileting OT Transfers Functional Problem(s): Toilet;Tub/Shower OT Additional Impairment(s): Fuctional Use of Upper Extremity OT Plan OT Intensity: Minimum of 1-2 x/day, 45 to 90 minutes OT Frequency: 5 out of 7 days OT Duration/Estimated Length of Stay: ~3.5-4 weeks OT Treatment/Interventions: Balance/vestibular training;Discharge planning;Pain management;Self Care/advanced ADL retraining;Therapeutic Activities;UE/LE Coordination activities;Cognitive remediation/compensation;Disease mangement/prevention;Functional mobility training;Patient/family education;Skin care/wound managment;Therapeutic Exercise;Community reintegration;DME/adaptive equipment instruction;Visual/perceptual remediation/compensation;Neuromuscular re-education;Psychosocial support;UE/LE Strength taining/ROM;Splinting/orthotics;Wheelchair propulsion/positioning OT Self Feeding Anticipated Outcome(s): supervision OT Basic Self-Care Anticipated Outcome(s): min A OT Toileting Anticipated Outcome(s): min  A OT Bathroom Transfers Anticipated Outcome(s): min A OT Recommendation Recommendations  for Other Services: Neuropsych consult Patient destination: Home Follow Up Recommendations: Outpatient OT Equipment Recommended: To be determined   Skilled Therapeutic Intervention OT eval initially with OT purpose, role and goals discussed with pt and pt's wife. Self care retraining at sink level. Pt was in bed when arrived. PT came to EOB with max A with extra time and max - total Cues to maintain balance. Pt required max A for static sitting balance at EOB while finishing eating lunch. Pt transferred into w/c with slide board with total A with extreme pushing towards the left requiring multiple stops and max cues to correct positioning and maintain safety. Pt performed bathing and dressing at the sink with focus on maintain sitting balance at midline using the mirror for visual feedback. Pt responded well to visual feedback. Educated and taught hemi dressing techniques and encourage functional use of left hand in all tasks. Pt performed 3 sit to stands at sink with visual cue from mirror, physical cue to bring right shoulder to wall next to him. Pt able to power up into standing with minimal A but requires max to total A to maintain.  Pt with difficulty activating and maintaining activity in left LE in standing. Pt with max multimodal cues for upright trunk posture. Educated pt and pt;s wife on pusher syndrome. Pt left sitting up in tilt in space w/c at end of session.    OT Evaluation Precautions/Restrictions  Precautions Precautions: Fall;Other (comment) Precaution Comments: permissive HTN initially (<220/120) then normotension, L sided weakness, aspiration precautions (dysphagia 2 with thins), ; L hemiparesis Restrictions Weight Bearing Restrictions: No General Chart Reviewed: Yes Family/Caregiver Present: Yes  Pain  no c/o pain  Home Living/Prior Functioning Home Living Family/patient expects to be discharged to:: Private residence Living Arrangements: Spouse/significant  other Available Help at Discharge: Family, Available 24 hours/day Type of Home: House Home Access: Stairs to enter Technical brewer of Steps: 1 Entrance Stairs-Rails: None Home Layout: One level Bathroom Shower/Tub: Chiropodist: Standard Bathroom Accessibility: Yes Additional Comments: Pt's wife reports they are planning to build a house (signed papework, getting walk in with grab bars, etc)  Lives  With: Spouse Prior Function Level of Independence: Independent with basic ADLs, Independent with transfers, Independent with homemaking with ambulation, Independent with gait  Able to Take Stairs?: Yes Driving: Yes Vocation: Retired ADL ADL Upper Body Bathing: Minimal assistance Where Assessed-Upper Body Bathing: Sitting at sink Lower Body Bathing: Maximal assistance Where Assessed-Lower Body Bathing: Sitting at sink Upper Body Dressing: Maximal assistance Where Assessed-Upper Body Dressing: Sitting at sink Lower Body Dressing: Maximal assistance Where Assessed-Lower Body Dressing: Sitting at sink, Standing at sink Toileting: Not assessed Toilet Transfer: Not assessed Vision Baseline Vision/History: No visual deficits Patient Visual Report: No change from baseline Vision Assessment?: Yes Alignment/Gaze Preference: Gaze right Tracking/Visual Pursuits: Impaired - to be further tested in functional context Perception  Perception: Impaired Inattention/Neglect: Does not attend to left side of body;Does not attend to left visual field Praxis Praxis: Impaired Praxis Impairment Details: Motor planning Cognition Overall Cognitive Status: Impaired/Different from baseline Arousal/Alertness: Awake/alert Orientation Level: Person;Place;Situation Person: Oriented Place: Oriented Situation: Oriented Year: (2001) Month: March Day of Week: Incorrect(thursday) Memory: Impaired Memory Impairment: Decreased recall of new information;Decreased short term  memory Decreased Short Term Memory: Verbal basic;Functional basic Immediate Memory Recall: Sock;Blue;Bed Memory Recall Sock: Not able to recall Memory Recall Blue: Not able to recall Memory Recall Bed: Not able to recall Attention: Sustained Sustained Attention: Impaired Sustained Attention Impairment: Functional basic Awareness: Impaired Awareness Impairment: Intellectual impairment Problem Solving: Impaired Problem Solving Impairment: Verbal basic;Functional basic Executive Function: (all impaired due to lower level of congition being impaired) Safety/Judgment: Impaired Sensation Sensation Light Touch: Impaired Detail Light Touch Impaired Details: Impaired LUE;Impaired LLE Proprioception: Impaired Detail Proprioception Impaired Details: Impaired LLE;Impaired LUE Coordination Gross Motor Movements are Fluid and Coordinated: No Fine Motor Movements are Fluid and Coordinated: No Coordination and Movement Description: grossly uncoordinated due to L hemiparesis/pushing, decreased sitting and standing balance/midline orientation, poor head control, generalized weakness, and fatigue Motor  Motor Motor: Hemiplegia;Abnormal postural alignment and control;Motor apraxia;Abnormal tone Motor - Skilled Clinical Observations: grossly uncoordinated due to L hemiparesis/pushing, decreased sitting and standing balance/midline orientation, poor head control, generalized weakness, and fatigue Mobility  Bed Mobility Bed Mobility: Rolling Right;Rolling Left;Supine to Sit;Sit to Supine Rolling Right: Moderate Assistance - Patient 50-74%(bed features) Rolling Left: Moderate Assistance - Patient 50-74%(bed features) Supine to Sit: Maximal Assistance - Patient - Patient 25-49% Sit to Supine: Moderate Assistance - Patient 50-74%(bed features) Transfers Sit to Stand: Maximal Assistance - Patient 25-49% Stand to Sit: Maximal Assistance - Patient 25-49%  Trunk/Postural Assessment  Cervical  Assessment Cervical Assessment: (forward flexed) Thoracic Assessment Thoracic Assessment: (forward flexed and rotated to the right) Lumbar Assessment Lumbar Assessment: (posterior pelvic tilt) Postural Control Postural Control: Deficits on evaluation Head Control: unable to achieve neutral position Trunk Control: pushing to L Righting Reactions: decreased; strong L latera lean Protective Responses: decreased; frequently falling anteriorly and to the L  Balance Balance Balance Assessed: Yes Static Sitting Balance Static Sitting - Balance Support: Bilateral upper extremity supported;Feet supported Static Sitting - Level of Assistance: 2: Max assist Dynamic Sitting Balance Dynamic Sitting - Balance Support: Bilateral upper extremity supported;Feet supported Dynamic Sitting - Level of Assistance: 2: Max assist Sitting balance - Comments: Verbal, tactile, and visual cues for improved midline positioning. Patient sits with L lateral and anterior lean. Static Standing Balance Static Standing - Balance Support: Bilateral upper extremity supported Static Standing - Level of Assistance: 2: Max assist Extremity/Trunk Assessment RUE Assessment RUE Assessment: Within Functional Limits LUE Assessment LUE Assessment: Exceptions to [facility] LUE Body System: Neuro Brunstrum  levels for arm and hand: Arm;Hand Brunstrum level for arm: Stage V Relative Independence from Synergy Brunstrum level for hand: Stage V Independence from basic synergies LUE Tone LUE Tone Comments: noted tone in left LE     Refer to Care Plan for Long Term Goals  Recommendations for other services: Neuropsych   Discharge Criteria: Patient will be discharged from OT if patient refuses treatment 3 consecutive times without medical reason, if treatment goals not met, if there is a change in medical status, if patient makes no progress towards goals or if patient is discharged from hospital.  The above assessment, treatment  plan, treatment alternatives and goals were discussed and mutually agreed upon: by patient and by family  Nicoletta Ba 07/08/2019, 3:01 PM

## 2019-07-08 NOTE — Progress Notes (Signed)
Social Work Patient ID: Russell Russell, male   DOB: 03/09/1951, 69 y.o.   MRN: [id number]  SW entered patient's room to provide team conference update. Patient sitting chair waiting to be transferred with therapy and nursing. Spouse at bedside. SW provided patient and spouse with team conference update. Both agree and happy. Will continue to follow up wit any questions or concerns.

## 2019-07-08 NOTE — Evaluation (Signed)
Speech Language Pathology Assessment and Plan  Patient Details  Name: Russell Russell MRN: [id number] Date of Birth: 09-Jan-1951  SLP Diagnosis: Cognitive Impairments;Dysarthria;Dysphagia  Rehab Potential: Good ELOS: 3-4 weeks    Today's Date: 07/08/2019 SLP Individual Time: 7510-2585 SLP Individual Time Calculation (min): 55 min   Problem List:  Patient Active Problem List   Diagnosis Date Noted  . Leukopenia   . Benign essential HTN   . Hypoalbuminemia   . Cerebrovascular accident (CVA) of right basal ganglia ([place]) 07/07/2019  . Acute ischemic stroke ([place])   . Alcohol abuse   . Dyslipidemia   . Dysphagia, post-stroke   . Polycythemia   . Stroke ([place]) 06/30/2019  . Prediabetes 12/23/2016  . Elevated LFTs 12/23/2016  . Pure hypercholesterolemia 12/21/2016  . Alcoholism ([place]) 12/21/2016  . Eczema 09/18/2013  . Essential hypertension 09/18/2013  . Gastroesophageal reflux disease without esophagitis 09/18/2013  . Tobacco abuse 09/18/2013   Past Medical History:  Past Medical History:  Diagnosis Date  . Alcohol abuse    drinks 1 gallon of Gin every weekend, nothing during the week x >15 yrs  . Elevated transaminase level    +elev bili: abd u/s 06/2014 showed stable small hepatic hemangiomas, o/w normal.  . Essential hypertension   . GERD (gastroesophageal reflux disease)   . Hyperlipidemia 03/2014   Atorv started-chol improved  . Impaired fasting glucose 03/2014  . Nephrolithiasis   . PUD (peptic ulcer disease)   . Tobacco dependence    chantix: "psych effects"   Past Surgical History:  Past Surgical History:  Procedure Laterality Date  . COLONOSCOPY  2005   Recall 10 yrs ([place])  . LOOP RECORDER INSERTION N/A 07/03/2019   Procedure: LOOP RECORDER INSERTION;  Surgeon: Thompson Grayer, MD;  Location: [facility];  Service: Cardiovascular;  Laterality: N/A;  . removal of kidney stones  2006   cystoscopic--removed from ureter.  No prob since.     Assessment / Plan / Recommendation Clinical Impression Patient is a 69 year old right-handed male with history of alcohol/tobacco abuse, hyperlipidemia. Reportedly independent prior to admission.  He presented in the late evening of 06/30/2019 with left hemiparesis.  Cranial CT scan unremarkable for acute intracranial process.  Patient did not receive TPA.  CT angiogram head and neck no emergent large vessel occlusion or high-grade stenosis.  MRI showed multiple scattered acute ischemic infarcts involving the posterior right basal ganglia right cerebral hemisphere and right cerebellum. Patient did undergo loop recorder placement. Therapy evaluations completed and patient was admitted for a comprehensive rehab program 07/07/19.  Patient was administered the [place] and scored 16/30 points with a score of 26 or above considered normal. Patient demonstrates deficits in short-term recall, basic problem solving and awareness of both his physical and cognitive deficits which impacts his safety with functional and familiar tasks. Patient also demonstrates a mild-moderate dysarthria due to left lingual and labial weakness resulting in imprecise consonants. Intelligibility is further exacerbated by decreased vocal intensity from reduced breath support. Patient consumed thin liquids without overt s/s of aspiration but demonstrated mild-moderate oral residue with left pocketing/anterior spillage due to decreased mastication. Recommend patient continue Dys. 2 textures with thin liquids. Patient would benefit from skilled SLP intervention to maximize his swallowing, cognitive and speech function prior to discharge.    Skilled Therapeutic Interventions          Administered a cognitive-linguistic evaluation and BSE, please see above for details.   SLP Assessment  Patient will need skilled Speech Lanaguage  Pathology Services during CIR admission    Recommendations  SLP Diet Recommendations: Dysphagia 2 (Fine  chop);Thin Liquid Administration via: Straw Medication Administration: Crushed with puree Supervision: Patient able to self feed;Full supervision/cueing for compensatory strategies Compensations: Slow rate;Small sips/bites;Lingual sweep for clearance of pocketing;Monitor for anterior loss;Follow solids with liquid;Minimize environmental distractions Postural Changes and/or Swallow Maneuvers: Seated upright 90 degrees Oral Care Recommendations: Oral care before and after PO Recommendations for Other Services: Neuropsych consult Patient destination: Home Follow up Recommendations: Home Health SLP;24 hour supervision/assistance Equipment Recommended: None recommended by SLP    SLP Frequency 3 to 5 out of 7 days   SLP Duration  SLP Intensity  SLP Treatment/Interventions 3-4 weeks  Minumum of 1-2 x/day, 30 to 90 minutes  Cognitive remediation/compensation;Dysphagia/aspiration precaution training;Internal/external aids;Speech/Language facilitation;Therapeutic Activities;Cueing hierarchy;Environmental controls;Functional tasks;Patient/family education    Pain No/Denies Pain   Prior Functioning Type of Home: House  Lives With: Spouse Available Help at Discharge: Family;Available 24 hours/day(wife is retiring in June but pt's siter is able to help out) Vocation: Retired  Programmer, systems Overall Cognitive Status: Impaired/Different from baseline Arousal/Alertness: Awake/alert Orientation Level: Oriented to person;Oriented to place;Oriented to situation;Disoriented to time Memory: Impaired Memory Impairment: Decreased recall of new information;Decreased short term memory Decreased Short Term Memory: Verbal basic;Functional basic Awareness: Impaired Awareness Impairment: Intellectual impairment Problem Solving: Impaired Problem Solving Impairment: Verbal basic;Functional basic Safety/Judgment: Impaired  Comprehension Auditory Comprehension Overall Auditory Comprehension:  Appears within functional limits for tasks assessed Visual Recognition/Discrimination Discrimination: Not tested Reading Comprehension Reading Status: Not tested Expression Expression Primary Mode of Expression: Verbal Verbal Expression Overall Verbal Expression: Appears within functional limits for tasks assessed Written Expression Dominant Hand: Right Written Expression: Not tested Oral Motor Oral Motor/Sensory Function Overall Oral Motor/Sensory Function: Mild impairment Facial ROM: Reduced left;Suspected CN VII (facial) dysfunction Facial Symmetry: Abnormal symmetry left;Suspected CN VII (facial) dysfunction Facial Strength: Reduced left;Suspected CN VII (facial) dysfunction Facial Sensation: Reduced left;Suspected CN V (Trigeminal) dysfunction Lingual ROM: Suspected CN XII (hypoglossal) dysfunction Lingual Symmetry: Abnormal symmetry left Lingual Strength: Suspected CN XII (hypoglossal) dysfunction Motor Speech Overall Motor Speech: Impaired Respiration: Impaired Level of Impairment: Phrase Phonation: Low vocal intensity Resonance: Within functional limits Articulation: Impaired Level of Impairment: Phrase Intelligibility: Intelligibility reduced Word: 75-100% accurate Phrase: 75-100% accurate Sentence: 75-100% accurate Conversation: 75-100% accurate Motor Planning: Witnin functional limits Effective Techniques: Slow rate;Increased vocal intensity  Bedside Swallowing Assessment General Date of Onset: 06/30/19 Previous Swallow Assessment: BSE 4/22: Recommended Dys. 2 textures with thin liquids Diet Prior to this Study: Dysphagia 2 (chopped);Thin liquids Temperature Spikes Noted: No Respiratory Status: Room air History of Recent Intubation: No Behavior/Cognition: Alert;Cooperative;Pleasant mood Oral Cavity - Dentition: Adequate natural dentition Self-Feeding Abilities: Able to feed self;Needs assist;Needs set up Patient Positioning: Upright in bed Baseline Vocal  Quality: Low vocal intensity Volitional Cough: Weak Volitional Swallow: Able to elicit  Ice Chips Ice chips: Not tested Thin Liquid Thin Liquid: Within functional limits Presentation: Straw Nectar Thick Nectar Thick Liquid: Not tested Honey Thick Honey Thick Liquid: Not tested Puree Puree: Impaired Presentation: Spoon;Self Fed Oral Phase Impairments: Reduced labial seal Oral Phase Functional Implications: Left anterior spillage;Left lateral sulci pocketing;Oral residue Solid Solid: Impaired Presentation: Self Fed Oral Phase Impairments: Reduced labial seal;Reduced lingual movement/coordination Oral Phase Functional Implications: Prolonged oral transit;Right anterior spillage;Oral residue BSE Assessment Risk for Aspiration Impact on safety and function: Mild aspiration risk Other Related Risk Factors: Cognitive impairment  Short Term Goals: Week 1: SLP Short Term Goal 1 (Week 1): Patient will  utilize speech intelligibility strategies with Min verbal cues to achieve ~90% intelligibility at the sentence level. SLP Short Term Goal 2 (Week 1): Patient will consume current diet with supervision verbal cues for use of swallowing compensatory strategies to reduce left anterior spillage/pocketing and oral residue. SLP Short Term Goal 3 (Week 1): Patient will demonstrate efficient mastication and complete oral clearance with trials of Dys. 3 textures over 2 sessions with Min verbal cues witout overt s/s of aspiration prior to diet upgrade. SLP Short Term Goal 4 (Week 1): Patient will name 2 physical and 2 cognitive changes with Mod verbal cues. SLP Short Term Goal 5 (Week 1): Patient will demonstrate functional problem solving for basic and familiar tasks with Mod verbal cues. SLP Short Term Goal 6 (Week 1): Patient will utilize external aids for recall of functional information with Mod verbal and visual cues.  Refer to Care Plan for Long Term Goals  Recommendations for other services:  Neuropsych  Discharge Criteria: Patient will be discharged from SLP if patient refuses treatment 3 consecutive times without medical reason, if treatment goals not met, if there is a change in medical status, if patient makes no progress towards goals or if patient is discharged from hospital.  The above assessment, treatment plan, treatment alternatives and goals were discussed and mutually agreed upon: by patient and by family  Katherina Wimer 07/08/2019, 11:55 AM

## 2019-07-09 ENCOUNTER — Inpatient Hospital Stay (HOSPITAL_COMMUNITY): Payer: BC Managed Care – PPO | Admitting: Occupational Therapy

## 2019-07-09 ENCOUNTER — Inpatient Hospital Stay (HOSPITAL_COMMUNITY): Payer: BC Managed Care – PPO

## 2019-07-09 ENCOUNTER — Inpatient Hospital Stay (HOSPITAL_COMMUNITY): Payer: BC Managed Care – PPO | Admitting: Speech Pathology

## 2019-07-09 NOTE — Progress Notes (Signed)
Speech Language Pathology Daily Session Note  Patient Details  Name: Russell Russell MRN: [id number] Date of Birth: 20-Nov-1950  Today's Date: 07/09/2019 SLP Individual Time: [id number] SLP Individual Time Calculation (min): 44 min  Short Term Goals: Week 1: SLP Short Term Goal 1 (Week 1): Patient will utilize speech intelligibility strategies with Min verbal cues to achieve ~90% intelligibility at the sentence level. SLP Short Term Goal 2 (Week 1): Patient will consume current diet with supervision verbal cues for use of swallowing compensatory strategies to reduce left anterior spillage/pocketing and oral residue. SLP Short Term Goal 3 (Week 1): Patient will demonstrate efficient mastication and complete oral clearance with trials of Dys. 3 textures over 2 sessions with Min verbal cues witout overt s/s of aspiration prior to diet upgrade. SLP Short Term Goal 4 (Week 1): Patient will name 2 physical and 2 cognitive changes with Mod verbal cues. SLP Short Term Goal 5 (Week 1): Patient will demonstrate functional problem solving for basic and familiar tasks with Mod verbal cues. SLP Short Term Goal 6 (Week 1): Patient will utilize external aids for recall of functional information with Mod verbal and visual cues.  Skilled Therapeutic Interventions: Pt was seen for skilled ST targeting cognitive goals. Pt's wife was present throughout session. In functional conversation pt required Max A verbal cues to identify goals for ST (swallowing, speech, cognition). He constructed basic PEG board designs X2 with Supervision, but increased Mod A verbal and visual cues were required for problem solving and organization with semi-complex designs. Pt mostly aware of errors, but with difficulty problem solving corrections and sticking to a strategy. Pt left laying in bed with alarm set and needs within reach, wife still present. Continue per current plan of care.     Pain Pain Assessment Pain Scale: 0-10 Pain  Score: 0-No pain  Therapy/Group: Individual Therapy  Arbutus Leas 07/09/2019, 12:50 PM

## 2019-07-09 NOTE — Progress Notes (Signed)
Occupational Therapy Session Note  Patient Details  Name: Russell Russell MRN: [id number] Date of Birth: 01/29/1951  Today's Date: 07/09/2019 OT Individual Time: 1003-1100 OT Individual Time Calculation (min): 57 min    Short Term Goals: Week 1:  OT Short Term Goal 1 (Week 1): Pt will transfer to drop arm BSC with mod A +2 with LRAD/ method OT Short Term Goal 2 (Week 1): Pt will perform sit to stand with max cues with mod A in prep for clothing management OT Short Term Goal 3 (Week 1): Pt will be able to maintain static sitting balance in prep for ADL tasks with consistant mod A OT Short Term Goal 4 (Week 1): Pt will incorporate left UE in functional tasks with minimal cuing  Skilled Therapeutic Interventions/Progress Updates:    Treatment session with focus on trunk control, midline awareness, and weight shifting during transfers, sitting balance, and sit > stand.  Pt received supine in bed declining bathing/dressing but agreeable to therapy session.  Completed bed mobility with min assist to roll and then mod to come to sitting EOB.  Mod assist +2 slide board transfer to Rt to w/c.  Engaged in dynamic sitting balance and LUE NMR in sitting at edge of mat with use of mirror for visual feedback for postural control.  Incorporated reaching with LUE for NMR and to facilitate weight shift and trunk rotation.  Required facilitation at hips and trunk to achieve weight shift to Rt.  Pt dropping items 20% of time with Lt hand but able to reach forward and retreive with assist for trunk control.  Engaged in reaching with RUE to further facilitate weight shift to Rt, facilitation at hips.  Educated on weight shift to Rt to address Lt pushing.  Completed sit > stand x4 with mod assist +2 for upright posture and facilitation at hips for improved positioning.  Use of mirror for visual feedback.  Completed slide board transfer max assist to Lt +2 for safety.  Pt remained semi-reclined in tilt in space w/c for  positioning and encouraged to remain OOB ~30 mins.   Therapy Documentation Precautions:  Precautions Precautions: Fall, Other (comment) Precaution Comments: permissive HTN initially (<220/120) then normotension, L sided weakness, aspiration precautions (dysphagia 2 with thins), ; L hemiparesis Restrictions Weight Bearing Restrictions: No General:   Vital Signs: Therapy Vitals Temp: 98.4 F (36.9 C) Temp Source: Oral Pulse Rate: (!) 107 Resp: 16 BP: (!) 114/93 Patient Position (if appropriate): Sitting Oxygen Therapy SpO2: 99 % O2 Device: Room Air Pain: Pain Assessment Pain Scale: 0-10 Pain Score: 0-No pain   Therapy/Group: Individual Therapy  [name] 07/09/2019, 3:36 PM

## 2019-07-09 NOTE — Progress Notes (Signed)
Physical Therapy Session Note  Patient Details  Name: Russell Russell MRN: [id number] Date of Birth: 04-22-1950  Today's Date: 07/09/2019 PT Individual Time: [phone] and 1415-1456 PT Individual Time Calculation (min): 59 min and 41 min  Short Term Goals: Week 1:  PT Short Term Goal 1 (Week 1): pt will transfer supine<>sitting EOB with mod A PT Short Term Goal 2 (Week 1): Pt will transfer sit<>stand with LRAD max A PT Short Term Goal 3 (Week 1): Pt will transfer bed<>chair with LRAD max A  Skilled Therapeutic Interventions/Progress Updates:   Treatment Session 1: [id number] 59 min Received pt supine in bed, pt agreeable to therapy, and denied any pain during session. Session focused on dressing, functional mobility/transfers, dynamic sitting and standing balance, ambulation, NMR, midline orientation, postural alignment, and improved activity tolerance. Donned pants in supine with mod A and rolled to R with min A and use of bedrails to pull pants over hips. Pt transferred supine<>sitting EOB with max A and use of bedrails. Pt able to maintain static sitting balance with CGA and bilateral UE support on rails. Donned shoes sitting EOB with total assist. Pt transferred bed<>TIS WC via slideboard max A. Pt required verbal cues for weight shifting, head/hips relationship, and hand placement on board and armrests. Pt transported to therapy gym in TIS WC total assist. Pt transferred TIS WC<>mat via slideboard max A +2 for safety x 2 additional trial throughout session. Pt worked on reciprocal scooting to L and R on mat with min/mod A and verbal cues for technique. Pt transferred sit<>stand x5 reps with mod A of 1 on L side and wall on R side. Pt required manual facilitation to block L knee buckling while reaching across midline to grab pieces of tape with L UE and place them on R side of windowsill below with +2 assist using mirror for visual feedback. Pt required verbal and tactile cues for weight shifting  to R, upright posture, head control while sitting/standing. Pt ambulated 61ft with 3 musketeer assist with therapist blocking L knee and manual facilitation to advance L LE and block L knee from buckling. Pt with severe trunk flexion and required max verbal cues for upright posture. Pt transported back to room in TIS WC total assist. Concluded session with pt semi-reclined in TIS WC, needs within reach, and seatbelt alarm on.    Treatment Session 2: [id number] 41 min Received pt supine in bed on bedpan, pt agreeable to therapy, and denied any pain during session. Session focused on functional mobility/transfers, dynamic sitting and standing balance, NMR, midline orientation, and improved activity tolerance. Pt rolled to R with min A and use of bedrail to get off bedpan. Donned clean incontinence brief with max A and pants with mod A due to L UE weakness. Pt transferred supine<>sitting EOB from flat bed with max A. Doffed dirty shirt and donned clean one with mod A and donned shoes sitting EOB with total assist. Pt transferred bed<>TIS WC via slideboard with max A +2 for safety. Pt required verbal cues for weight shifting, scooting, head/hips relationship, and hand placement. Pt transported to ortho gym in TIS WC total assist. Pt transferred sit<>stand mod A of 1 in standing frame. Pt with continued L pushing requiring max verbal, visual, and tactile cues to weight shift to R and avoid pushing. While standing pt worked on dynamic reaching for [employer] with L UE at various angles on R side with max A +1 to weight shifting to the R and + 2  to hold horseshoes at various angles to promote weight shifting to R. Pt with increased head and trunk flexion in standing requiring max A to promote upright posture and scapular retraction. Pt required rest breaks throughout due to increased fatigue. Pt transported back to room in TIS WC total assist. Concluded session with pt semi-reclined in TIS WC, needs within reach, and  seatbelt alarm on. Wife present at bedside.   Therapy Documentation Precautions:  Precautions Precautions: Fall, Other (comment) Precaution Comments: permissive HTN initially (<220/120) then normotension, L sided weakness, aspiration precautions (dysphagia 2 with thins), ; L hemiparesis Restrictions Weight Bearing Restrictions: No  Therapy/Group: Individual Therapy Alfonse Alpers PT, DPT   07/09/2019, 7:27 AM

## 2019-07-09 NOTE — Progress Notes (Signed)
[place] PHYSICAL MEDICINE & REHABILITATION PROGRESS NOTE  Subjective/Complaints: Patient seen sitting up in bed this morning.  He states he slept well overnight.  He states he had a good first day of therapies yesterday.  ROS: Denies CP, shortness of breath, nausea, vomiting, diarrhea.  Objective: Vital Signs: Blood pressure (!) 149/113, pulse 86, temperature 98.8 F (37.1 C), temperature source Oral, resp. rate 17, weight 58.3 kg, SpO2 100 %. No results found. Recent Labs    07/07/19 0352 07/08/19 0708  WBC 4.5 3.7*  HGB 17.5* 18.1*  HCT 50.6 52.3*  PLT 245 242   Recent Labs    07/07/19 0352 07/08/19 0708  NA 137 139  K 3.9 4.1  CL 102 100  CO2 25 27  GLUCOSE 107* 105*  BUN 20 17  CREATININE 1.02 1.14  CALCIUM 9.3 9.7    Physical Exam: BP (!) 149/113   Pulse 86   Temp 98.8 F (37.1 C) (Oral)   Resp 17   Wt 58.3 kg   SpO2 100%   BMI 18.44 kg/m  Constitutional: No distress . Vital signs reviewed. HENT: Normocephalic.  Atraumatic.  + Poor dentition. Eyes: EOMI. No discharge. Cardiovascular: No JVD. Respiratory: Normal effort.  No stridor. GI: Non-distended. Skin: Warm and dry.  Intact. Psych: Normal mood.  Normal behavior. Musc: No edema in extremities.  No tenderness in extremities. Neuro: Alert and oriented x2 Motor: RUE: 5/5 proximal distal LUE: 4/5 proximal distal, stable RLE: 5/5 proximal distal LLE: Hip flexion, knee extension 2+/5, ankle dorsiflexion 3+/5, stable Dysarthria, stable  Assessment/Plan: 1. Functional deficits secondary to multiple anterior and posterior right brain infarcts which require 3+ hours per day of interdisciplinary therapy in a comprehensive inpatient rehab setting.  Physiatrist is providing close team supervision and 24 hour management of active medical problems listed below.  Physiatrist and rehab team continue to assess barriers to discharge/monitor patient progress toward functional and medical goals  Care  Tool:  Bathing    Body parts bathed by patient: Right arm, Left arm, Chest, Abdomen, Right upper leg, Left upper leg, Face   Body parts bathed by helper: Front perineal area, Buttocks, Right lower leg, Left lower leg     Bathing assist Assist Level: Moderate Assistance - Patient 50 - 74%     Upper Body Dressing/Undressing Upper body dressing   What is the patient wearing?: Pull over shirt    Upper body assist Assist Level: Maximal Assistance - Patient 25 - 49%    Lower Body Dressing/Undressing Lower body dressing      What is the patient wearing?: Pants, Incontinence brief     Lower body assist Assist for lower body dressing: Maximal Assistance - Patient 25 - 49%     Toileting Toileting Toileting Activity did not occur (Clothing management and hygiene only): N/A (no void or bm)  Toileting assist Assist for toileting: Dependent - Patient 0%     Transfers Chair/bed transfer  Transfers assist     Chair/bed transfer assist level: Total Assistance - Patient < 25%     Locomotion Ambulation   Ambulation assist   Ambulation activity did not occur: Safety/medical concerns(L hemi, decreased postural control/balance, fatigue)          Walk 10 feet activity   Assist  Walk 10 feet activity did not occur: Safety/medical concerns(L hemi, decreased postural control/balance, fatigue)        Walk 50 feet activity   Assist Walk 50 feet with 2 turns activity did not occur:  Safety/medical concerns(L hemi, decreased postural control/balance, fatigue)         Walk 150 feet activity   Assist Walk 150 feet activity did not occur: Safety/medical concerns(L hemi, decreased postural control/balance, fatigue)         Walk 10 feet on uneven surface  activity   Assist Walk 10 feet on uneven surfaces activity did not occur: Safety/medical concerns(L hemi, decreased postural control/balance, fatigue)         Wheelchair     Assist Will patient use  wheelchair at discharge?: Yes Type of Wheelchair: Manual    Wheelchair assist level: Dependent - Patient 0%      Wheelchair 50 feet with 2 turns activity    Assist        Assist Level: Dependent - Patient 0%   Wheelchair 150 feet activity     Assist     Assist Level: Dependent - Patient 0%      Medical Problem List and Plan: 1.  Left side weakness secondary to multiple scattered anterior and posterior right brain infarcts.  Status post loop recorder  Continue CIR 2.  Antithrombotics: -DVT/anticoagulation: Lovenox             -antiplatelet therapy: Aspirin 325 mg daily and Plavix 75 mg daily x3 months then aspirin alone 3. Pain Management: Tylenol as needed 4. Mood: Provide emotional support             -antipsychotic agents: N/A 5. Neuropsych: This patient is?  Fully capable of making decisions on his own behalf. 6. Skin/Wound Care: Routine skin checks 7. Fluids/Electrolytes/Nutrition: Routine in and outs.  BMP within acceptable range on 4/28 8.  Hypertension.  Lopressor 12.5 mg twice daily.    Mildly elevated on 4/29  Monitor with increased mobility 9.  Hyperlipidemia.  Lipitor 10.  Dysphagia.  Dysphagia #2 thins liquids.  Follow-up speech therapy  Advance diet as tolerated 11.  History of tobacco and alcohol use.  NicoDerm patch.  Provide counseling.    Monitor for withdrawal 12.  Moderate hypoalbuminemia  Supplement initiated on 4/28 13. Leukopenia  WBCs 3/7 on 4/28, labs ordered for tomorrow  Cont to monitor  LOS: 2 days A FACE TO FACE EVALUATION WAS PERFORMED  Meko Masterson Lorie Phenix 07/09/2019, 8:44 AM

## 2019-07-10 ENCOUNTER — Inpatient Hospital Stay (HOSPITAL_COMMUNITY): Payer: BC Managed Care – PPO | Admitting: Occupational Therapy

## 2019-07-10 ENCOUNTER — Inpatient Hospital Stay (HOSPITAL_COMMUNITY): Payer: BC Managed Care – PPO | Admitting: Speech Pathology

## 2019-07-10 ENCOUNTER — Inpatient Hospital Stay (HOSPITAL_COMMUNITY): Payer: BC Managed Care – PPO

## 2019-07-10 DIAGNOSIS — I69391 Dysphagia following cerebral infarction: Secondary | ICD-10-CM | POA: Diagnosis not present

## 2019-07-10 DIAGNOSIS — D72819 Decreased white blood cell count, unspecified: Secondary | ICD-10-CM | POA: Diagnosis not present

## 2019-07-10 DIAGNOSIS — I639 Cerebral infarction, unspecified: Secondary | ICD-10-CM | POA: Diagnosis not present

## 2019-07-10 DIAGNOSIS — I10 Essential (primary) hypertension: Secondary | ICD-10-CM | POA: Diagnosis not present

## 2019-07-10 DIAGNOSIS — R0989 Other specified symptoms and signs involving the circulatory and respiratory systems: Secondary | ICD-10-CM

## 2019-07-10 LAB — CBC WITH DIFFERENTIAL/PLATELET
Abs Immature Granulocytes: 0.01 10*3/uL (ref 0.00–0.07)
Basophils Absolute: 0 10*3/uL (ref 0.0–0.1)
Basophils Relative: 1 %
Eosinophils Absolute: 0.2 10*3/uL (ref 0.0–0.5)
Eosinophils Relative: 4 %
HCT: 50.2 % (ref 39.0–52.0)
Hemoglobin: 17.1 g/dL — ABNORMAL HIGH (ref 13.0–17.0)
Immature Granulocytes: 0 %
Lymphocytes Relative: 28 %
Lymphs Abs: 1 10*3/uL (ref 0.7–4.0)
MCH: 32.1 pg (ref 26.0–34.0)
MCHC: 34.1 g/dL (ref 30.0–36.0)
MCV: 94.4 fL (ref 80.0–100.0)
Monocytes Absolute: 0.7 10*3/uL (ref 0.1–1.0)
Monocytes Relative: 20 %
Neutro Abs: 1.7 10*3/uL (ref 1.7–7.7)
Neutrophils Relative %: 47 %
Platelets: 249 10*3/uL (ref 150–400)
RBC: 5.32 MIL/uL (ref 4.22–5.81)
RDW: 15.9 % — ABNORMAL HIGH (ref 11.5–15.5)
WBC: 3.7 10*3/uL — ABNORMAL LOW (ref 4.0–10.5)
nRBC: 0 % (ref 0.0–0.2)

## 2019-07-10 LAB — JAK2 EXONS 12-15

## 2019-07-10 LAB — JAK2  V617F QUAL. WITH REFLEX TO EXON 12: Reflex:: 15

## 2019-07-10 NOTE — Progress Notes (Signed)
Occupational Therapy Session Note  Patient Details  Name: Russell Russell MRN: [id number] Date of Birth: 20-Apr-1950  Today's Date: 07/10/2019 OT Individual Time: [id number] OT Individual Time Calculation (min): 70 min    Short Term Goals: Week 1:  OT Short Term Goal 1 (Week 1): Pt will transfer to drop arm BSC with mod A +2 with LRAD/ method OT Short Term Goal 2 (Week 1): Pt will perform sit to stand with max cues with mod A in prep for clothing management OT Short Term Goal 3 (Week 1): Pt will be able to maintain static sitting balance in prep for ADL tasks with consistant mod A OT Short Term Goal 4 (Week 1): Pt will incorporate left UE in functional tasks with minimal cuing  Skilled Therapeutic Interventions/Progress Updates:    Treatment session with focus on trunk control and midline awareness during sitting, standing, and transfers.  Pt received upright in w/c on bedpan.  Pt unable to toilet and reports no further need to attempt.  Engaged in sit > stand with mod assist of 1 caregiver while 2nd person completed hygiene and assisted with clothing management.  Facilitation and cues for weight shift to achieve midline as pt continues to demonstrate pushing to Lt with transitional movements.  Squat pivot transfer to Rt with cues for Rt hand placement on chest to decrease pushing tendencies with transfers.  Engaged in weight shifting in sitting with focus on reaching across midline with LUE and outside BOS with RUE to facilitate weight shifting and trunk elongation and shortening.  Engaged in sit > stand while reaching with RUE to further facilitate weight shift out of Lt lean, +2 with facilitation at Lt knee and along Lt torso/trunk to facilitate weight shift.  Pt with improved weight shift and decreased pushing when reaching towards Rt during transitional movement.  Blocked practice with seated weight shifting and trunk control and well as with sit > stand.  Engaged in reaching with LUE to grasp  resistive clothespins, required facilitation due to dual task of reaching and pinch impacting pt ability to complete task.  Returned to w/c squat pivot to Lt mod assist.  Pt remained reclined in tilt in space w/c with seat belt alarm on and all needs in reach.  Wife present at bedside.  Therapy Documentation Precautions:  Precautions Precautions: Fall, Other (comment) Precaution Comments: permissive HTN initially (<220/120) then normotension, L sided weakness, aspiration precautions (dysphagia 2 with thins), ; L hemiparesis Restrictions Weight Bearing Restrictions: No Pain:  Pt with no c/o pain   Therapy/Group: Individual Therapy  [name] 07/10/2019, 12:06 PM

## 2019-07-10 NOTE — Progress Notes (Signed)
Physical Therapy Session Note  Patient Details  Name: Russell Russell MRN: [id number] Date of Birth: [birth year]  Today's Date: 07/10/2019 PT Individual Time: [id number] PT Individual Time Calculation (min): 69 min   Short Term Goals: Week 1:  PT Short Term Goal 1 (Week 1): pt will transfer supine<>sitting EOB with mod A PT Short Term Goal 2 (Week 1): Pt will transfer sit<>stand with LRAD max A PT Short Term Goal 3 (Week 1): Pt will transfer bed<>chair with LRAD max A  Skilled Therapeutic Interventions/Progress Updates:   Received pt supine in bed, pt agreeable to therapy, and denied any pain during session. Session focused on functional mobility/transfers, LE strength, dynamic sitting/standing balance/coordination, gait training, NMR, motor control/planning, and improved activity tolerance. Pt transferred supine<>sitting EOB with max A. Donned shoes sitting EOB with max A. Pt transferred bed<>TIS WC squat<>pivot with max A +2 for safety. Pt transported to dayroom in [place] WC total assist. Donned LiteGait harness in sitting with total assist and pt transferred sit<>stand in LiteGait with mod A. Pt ambulated 64ft x 2 trials in LiteGait with +2/+3 assist. Pt required manual facilitation to block L knee buckling and advance L LE and +2 assist for weight shifting to L to clear R LE when stepping and for pelvic rotation and alignment.+3 assist required with steering LiteGait during Trial 1 of gait training. Pt required max verbal cues for upright posture, head control, weight shifting, and to extend L knee in stance. Pt required multiple rest breaks throughout session due to increased fatigue. Pt transferred sit<>stand mod A with rail on R and worked on L knee extension x4 minutes using mirror for visual feedback. Pt required max cues to avoid pushing to L, for upright posture, and for weight shifting. Pt transferred sit<>stand mod A with rail on R and worked on [employer] 2x5 with therapist proving manual  facilitation to block L knee buckling. Pt required max verbal cues to avoid pushing to the L, and to weight shift to the R. Pt transported back to room in TIS [facility] total assist. Pt agreeable to remain sitting up for a little while. Concluded session with pt semi-reclined in TIS WC, needs within reach, and seatbelt alarm on.   Therapy Documentation Precautions:  Precautions Precautions: Fall, Other (comment) Precaution Comments: permissive HTN initially (<220/120) then normotension, L sided weakness, aspiration precautions (dysphagia 2 with thins), ; L hemiparesis Restrictions Weight Bearing Restrictions: No   Therapy/Group: Individual Therapy Alfonse Alpers PT, DPT   07/10/2019, 7:27 AM

## 2019-07-10 NOTE — Progress Notes (Signed)
Speech Language Pathology Daily Session Note  Patient Details  Name: Russell Russell MRN: [id number] Date of Birth: [birth year]  Today's Date: 07/10/2019 SLP Individual Time: 1302-1330 SLP Individual Time Calculation (min): 28 min  Short Term Goals: Week 1: SLP Short Term Goal 1 (Week 1): Patient will utilize speech intelligibility strategies with Min verbal cues to achieve ~90% intelligibility at the sentence level. SLP Short Term Goal 2 (Week 1): Patient will consume current diet with supervision verbal cues for use of swallowing compensatory strategies to reduce left anterior spillage/pocketing and oral residue. SLP Short Term Goal 3 (Week 1): Patient will demonstrate efficient mastication and complete oral clearance with trials of Dys. 3 textures over 2 sessions with Min verbal cues witout overt s/s of aspiration prior to diet upgrade. SLP Short Term Goal 4 (Week 1): Patient will name 2 physical and 2 cognitive changes with Mod verbal cues. SLP Short Term Goal 5 (Week 1): Patient will demonstrate functional problem solving for basic and familiar tasks with Mod verbal cues. SLP Short Term Goal 6 (Week 1): Patient will utilize external aids for recall of functional information with Mod verbal and visual cues.  Skilled Therapeutic Interventions: Pt was seen for skilled ST targeting speech goals. Pt's wife was present throughout session. SLP facilitated session by introducing [employer] EMST 20 device to target breath support and diaphragmatic breathing techniques to aid in speech intelligibility. Pt completed 2 sets of 10 exercises with device set to 15 cm H2O with self-percieved rating of 6/10 difficulty. However, based on visible level of exertion observed during his performance (and in comparison to exertion exhibited in previous reps with device set to 12 cm H2O), would recommend leaving device set to current level as appropriate level of effort was exerted. Recommended pt complete 25 reps 3X daily  (handout left for pt and wife). SLP also introduced increased vocal intensity and overarticulation as compensatory strategies for speech that will be targeted in future sessions. Pt left laying in bed with alarm set and wife still present at bedside. Continue per current plan of care.         Pain Pain Assessment Pain Scale: 0-10 Pain Score: 0-No pain   Therapy/Group: Individual Therapy  Arbutus Leas 07/10/2019, 7:21 AM

## 2019-07-10 NOTE — Progress Notes (Signed)
Occupational Therapy Session Note  Patient Details  Name: Russell Russell MRN: [id number] Date of Birth: 09/24/1950  Today's Date: 07/10/2019 OT Individual Time: 0815-0900 OT Individual Time Calculation (min): 45 min    Short Term Goals: Week 1:  OT Short Term Goal 1 (Week 1): Pt will transfer to drop arm BSC with mod A +2 with LRAD/ method OT Short Term Goal 2 (Week 1): Pt will perform sit to stand with max cues with mod A in prep for clothing management OT Short Term Goal 3 (Week 1): Pt will be able to maintain static sitting balance in prep for ADL tasks with consistant mod A OT Short Term Goal 4 (Week 1): Pt will incorporate left UE in functional tasks with minimal cuing  Skilled Therapeutic Interventions/Progress Updates:    1:1 pt received in bed with brief soaked and no pain. Pt rolls with VC for hooklying position and MIN A to maintain while OT cleanses peri area and Buttocks. Pt supine>sitting with MIN A and VC for sequencing. Pt initially coming onto R elbow to decrease pushing tendenecies at EOB. Pt requires HOH A to thread BLE into pants and MAX HOH A to pull up socks. Pt completes sit to stand at EOB with MOD A +2 while +2 pulls up pants in 3 musketieers poition. Pt completes squat pivot transfer with MOD A to maintain trunk upright and anterior weight shift. Pt completes UB dressing in chair with S and VC for orientation of shirt. Grooming at sink with forced use of LUE for reaching for NMR and HOH A for smoothness of reach d/t ataxia. 3 sit to stands at sink performed with MOD decreasing to MIN A overall wit RUE on heart and VC for weight shift laterally to R and L knee extension. Exited sesison with pt seated in bed, exi talarm on and cal light in reach  Therapy Documentation Precautions:  Precautions Precautions: Fall, Other (comment) Precaution Comments: permissive HTN initially (<220/120) then normotension, L sided weakness, aspiration precautions (dysphagia 2 with thins), ;  L hemiparesis Restrictions Weight Bearing Restrictions: No General:   Vital Signs:  Pain:   ADL: ADL Upper Body Bathing: Minimal assistance Where Assessed-Upper Body Bathing: Sitting at sink Lower Body Bathing: Maximal assistance Where Assessed-Lower Body Bathing: Sitting at sink Upper Body Dressing: Maximal assistance Where Assessed-Upper Body Dressing: Sitting at sink Lower Body Dressing: Maximal assistance Where Assessed-Lower Body Dressing: Sitting at sink, Standing at sink Toileting: Not assessed Toilet Transfer: Not assessed Vision   Perception    Praxis   Exercises:   Other Treatments:     Therapy/Group: Individual Therapy  Tonny Branch 07/10/2019, 9:02 AM

## 2019-07-10 NOTE — Progress Notes (Signed)
[place] PHYSICAL MEDICINE & REHABILITATION PROGRESS NOTE  Subjective/Complaints: Patient seen laying in bed this AM, working with therapies x2.  He states he slept well overnight. Discussed LUE coordination with therapies.   ROS: Denies CP, shortness of breath, nausea, vomiting, diarrhea.  Objective: Vital Signs: Blood pressure (!) 136/107, pulse 88, temperature 98.9 F (37.2 C), temperature source Oral, resp. rate 18, weight 58.3 kg, SpO2 100 %. No results found. Recent Labs    07/08/19 0708 07/10/19 0445  WBC 3.7* 3.7*  HGB 18.1* 17.1*  HCT 52.3* 50.2  PLT 242 249   Recent Labs    07/08/19 0708  NA 139  K 4.1  CL 100  CO2 27  GLUCOSE 105*  BUN 17  CREATININE 1.14  CALCIUM 9.7    Physical Exam: BP (!) 136/107 (BP Location: Left Arm)   Pulse 88   Temp 98.9 F (37.2 C) (Oral)   Resp 18   Wt 58.3 kg   SpO2 100%   BMI 18.44 kg/m  Constitutional: No distress . Vital signs reviewed. HENT: Normocephalic.  Atraumatic. +Poor dentition.  Eyes: EOMI. No discharge. Cardiovascular: No JVD. Respiratory: Normal effort.  No stridor. GI: Non-distended. Skin: Warm and dry.  Intact. Psych: Normal mood.  Normal behavior. Musc: No edema in extremities.  No tenderness in extremities. Neuro: Alert  Motor: RUE: 5/5 proximal distal LUE: Shoulder abduction 3-3+/5, distally 4/5 RLE: 5/5 proximal distal LLE: Hip flexion, knee extension 2+/5, ankle dorsiflexion 3/5 Dysarthria, unchanged Left facial weakness  Assessment/Plan: 1. Functional deficits secondary to multiple anterior and posterior right brain infarcts which require 3+ hours per day of interdisciplinary therapy in a comprehensive inpatient rehab setting.  Physiatrist is providing close team supervision and 24 hour management of active medical problems listed below.  Physiatrist and rehab team continue to assess barriers to discharge/monitor patient progress toward functional and medical goals  Care  Tool:  Bathing    Body parts bathed by patient: Right arm, Left arm, Chest, Abdomen, Right upper leg, Left upper leg, Face   Body parts bathed by helper: Front perineal area, Buttocks, Right lower leg, Left lower leg     Bathing assist Assist Level: Moderate Assistance - Patient 50 - 74%     Upper Body Dressing/Undressing Upper body dressing   What is the patient wearing?: Hospital gown only    Upper body assist Assist Level: Maximal Assistance - Patient 25 - 49%    Lower Body Dressing/Undressing Lower body dressing      What is the patient wearing?: Incontinence brief     Lower body assist Assist for lower body dressing: Maximal Assistance - Patient 25 - 49%     Toileting Toileting Toileting Activity did not occur (Clothing management and hygiene only): N/A (no void or bm)  Toileting assist Assist for toileting: Dependent - Patient 0%     Transfers Chair/bed transfer  Transfers assist     Chair/bed transfer assist level: 2 Helpers     Locomotion Ambulation   Ambulation assist   Ambulation activity did not occur: Safety/medical concerns(L hemi, decreased postural control/balance, fatigue)  Assist level: 2 helpers Assistive device: Other (comment)(3 muskateers) Max distance: 63ft   Walk 10 feet activity   Assist  Walk 10 feet activity did not occur: Safety/medical concerns(L hemi, decreased postural control/balance, fatigue)        Walk 50 feet activity   Assist Walk 50 feet with 2 turns activity did not occur: Safety/medical concerns(L hemi, decreased postural control/balance, fatigue)  Walk 150 feet activity   Assist Walk 150 feet activity did not occur: Safety/medical concerns(L hemi, decreased postural control/balance, fatigue)         Walk 10 feet on uneven surface  activity   Assist Walk 10 feet on uneven surfaces activity did not occur: Safety/medical concerns(L hemi, decreased postural control/balance, fatigue)          Wheelchair     Assist Will patient use wheelchair at discharge?: Yes Type of Wheelchair: Manual    Wheelchair assist level: Dependent - Patient 0%      Wheelchair 50 feet with 2 turns activity    Assist        Assist Level: Dependent - Patient 0%   Wheelchair 150 feet activity     Assist     Assist Level: Dependent - Patient 0%      Medical Problem List and Plan: 1.  Left side weakness secondary to multiple scattered anterior and posterior right brain infarcts.  Status post loop recorder  Continue CIR 2.  Antithrombotics: -DVT/anticoagulation: Lovenox             -antiplatelet therapy: Aspirin 325 mg daily and Plavix 75 mg daily x3 months then aspirin alone 3. Pain Management: Tylenol as needed 4. Mood: Provide emotional support             -antipsychotic agents: N/A 5. Neuropsych: This patient is?  Fully capable of making decisions on his own behalf. 6. Skin/Wound Care: Routine skin checks 7. Fluids/Electrolytes/Nutrition: Routine in and outs.  BMP within acceptable range on 4/28 8.  Hypertension.  Lopressor 12.5 mg twice daily.    Labile on 4/30  Monitor with increased mobility 9.  Hyperlipidemia.  Lipitor 10.  Dysphagia.  Dysphagia #2 thins liquids.  Follow-up speech therapy  Advance diet as tolerated 11.  History of tobacco and alcohol use.  NicoDerm patch.  Provide counseling.    Monitor for withdrawal 12.  Moderate hypoalbuminemia  Supplement initiated on 4/28 13. Leukopenia  WBCs 3.7 on 4/30, labs ordered for Monday  Cont to monitor  LOS: 3 days A FACE TO FACE EVALUATION WAS PERFORMED  Sharisa Toves Lorie Phenix 07/10/2019, 8:39 AM

## 2019-07-11 NOTE — Progress Notes (Signed)
[place] PHYSICAL MEDICINE & REHABILITATION PROGRESS NOTE  Subjective/Complaints: Patient seen laying in bed this morning.  He states he slept well overnight.  He denies complaints.  ROS: Denies CP, shortness of breath, nausea, vomiting, diarrhea.  Objective: Vital Signs: Blood pressure (!) 139/105, pulse 83, temperature 98.3 F (36.8 C), resp. rate 18, weight 58.3 kg, SpO2 98 %. No results found. Recent Labs    07/10/19 0445  WBC 3.7*  HGB 17.1*  HCT 50.2  PLT 249   No results for input(s): NA, K, CL, CO2, GLUCOSE, BUN, CREATININE, CALCIUM in the last 72 hours.  Physical Exam: BP (!) 139/105 (BP Location: Left Arm)   Pulse 83   Temp 98.3 F (36.8 C)   Resp 18   Wt 58.3 kg   SpO2 98%   BMI 18.44 kg/m  Constitutional: No distress . Vital signs reviewed. HENT: Normocephalic.  Atraumatic.  + Poor dentition. Eyes: EOMI. No discharge. Cardiovascular: No JVD. Respiratory: Normal effort.  No stridor. GI: Non-distended. Skin: Warm and dry.  Intact. Psych: Normal mood.  Normal behavior. Musc: No edema in extremities.  No tenderness in extremities. Neuro: Alert Motor: Left upper extremity: Shoulder abduction 3/5, distally 4/5 Left lower extremity: Hip flexion, knee extension 2+/5, ankle dorsiflexion 3/5 Dysarthria unchanged Left facial weakness, unchanged  Assessment/Plan: 1. Functional deficits secondary to multiple anterior and posterior right brain infarcts which require 3+ hours per day of interdisciplinary therapy in a comprehensive inpatient rehab setting.  Physiatrist is providing close team supervision and 24 hour management of active medical problems listed below.  Physiatrist and rehab team continue to assess barriers to discharge/monitor patient progress toward functional and medical goals  Care Tool:  Bathing    Body parts bathed by patient: Right arm, Left arm, Chest, Abdomen, Right upper leg, Left upper leg, Face   Body parts bathed by helper: Front  perineal area, Buttocks, Right lower leg, Left lower leg     Bathing assist Assist Level: Moderate Assistance - Patient 50 - 74%     Upper Body Dressing/Undressing Upper body dressing   What is the patient wearing?: Hospital gown only    Upper body assist Assist Level: Maximal Assistance - Patient 25 - 49%    Lower Body Dressing/Undressing Lower body dressing      What is the patient wearing?: Incontinence brief     Lower body assist Assist for lower body dressing: Maximal Assistance - Patient 25 - 49%     Toileting Toileting Toileting Activity did not occur (Clothing management and hygiene only): N/A (no void or bm)  Toileting assist Assist for toileting: Minimal Assistance - Patient > 75%     Transfers Chair/bed transfer  Transfers assist     Chair/bed transfer assist level: 2 Helpers     Locomotion Ambulation   Ambulation assist   Ambulation activity did not occur: Safety/medical concerns(L hemi, decreased postural control/balance, fatigue)  Assist level: 2 helpers Assistive device: Lite Gait Max distance: 26ft   Walk 10 feet activity   Assist  Walk 10 feet activity did not occur: Safety/medical concerns(L hemi, decreased postural control/balance, fatigue)  Assist level: 2 helpers Assistive device: Lite Gait   Walk 50 feet activity   Assist Walk 50 feet with 2 turns activity did not occur: Safety/medical concerns(L hemi, decreased postural control/balance, fatigue)         Walk 150 feet activity   Assist Walk 150 feet activity did not occur: Safety/medical concerns(L hemi, decreased postural control/balance, fatigue)  Walk 10 feet on uneven surface  activity   Assist Walk 10 feet on uneven surfaces activity did not occur: Safety/medical concerns(L hemi, decreased postural control/balance, fatigue)         Wheelchair     Assist Will patient use wheelchair at discharge?: Yes Type of Wheelchair: Manual     Wheelchair assist level: Dependent - Patient 0%      Wheelchair 50 feet with 2 turns activity    Assist        Assist Level: Dependent - Patient 0%   Wheelchair 150 feet activity     Assist     Assist Level: Dependent - Patient 0%      Medical Problem List and Plan: 1.  Left side weakness secondary to multiple scattered anterior and posterior right brain infarcts.  Status post loop recorder  Continue CIR 2.  Antithrombotics: -DVT/anticoagulation: Lovenox             -antiplatelet therapy: Aspirin 325 mg daily and Plavix 75 mg daily x3 months then aspirin alone 3. Pain Management: Tylenol as needed 4. Mood: Provide emotional support             -antipsychotic agents: N/A 5. Neuropsych: This patient is?  Fully capable of making decisions on his own behalf. 6. Skin/Wound Care: Routine skin checks 7. Fluids/Electrolytes/Nutrition: Routine in and outs.  BMP within acceptable range on 4/28, labs ordered for Monday 8.  Hypertension.  Lopressor 12.5 mg twice daily.    Diastolic elevated on 5/1, monitor for trend  Monitor with increased mobility 9.  Hyperlipidemia.  Lipitor 10.  Dysphagia.  Dysphagia #2 thins liquids.  Follow-up speech therapy  Advance diet as tolerated 11.  History of tobacco and alcohol use.  NicoDerm patch.  Provide counseling.    Monitor for withdrawal 12.  Moderate hypoalbuminemia  Supplement initiated on 4/28 13. Leukopenia  WBCs 3.7 on 4/30, labs ordered for Monday  Cont to monitor  LOS: 4 days A FACE TO FACE EVALUATION WAS PERFORMED  Russell Russell Russell Russell 07/11/2019, 10:29 AM

## 2019-07-12 ENCOUNTER — Inpatient Hospital Stay (HOSPITAL_COMMUNITY): Payer: BC Managed Care – PPO | Admitting: Occupational Therapy

## 2019-07-12 ENCOUNTER — Inpatient Hospital Stay (HOSPITAL_COMMUNITY): Payer: BC Managed Care – PPO | Admitting: Speech Pathology

## 2019-07-12 NOTE — Progress Notes (Signed)
Occupational Therapy Session Note  Patient Details  Name: Russell Russell MRN: [id number] Date of Birth: 1951/01/08  Today's Date: 07/12/2019 OT Individual Time: [phone] and 1433-1530 OT Individual Time Calculation (min): 73 min and 57 min  Short Term Goals: Week 1:  OT Short Term Goal 1 (Week 1): Pt will transfer to drop arm BSC with mod A +2 with LRAD/ method OT Short Term Goal 2 (Week 1): Pt will perform sit to stand with max cues with mod A in prep for clothing management OT Short Term Goal 3 (Week 1): Pt will be able to maintain static sitting balance in prep for ADL tasks with consistant mod A OT Short Term Goal 4 (Week 1): Pt will incorporate left UE in functional tasks with minimal cuing  Skilled Therapeutic Interventions/Progress Updates:    Pt greeted in bed with no c/o pain, spouse Russell Russell trimming his toenails with heavy-duty clippers. Pt had just finished breakfast, agreeable to complete bathing/dressing tasks at the sink today. Mod A for supine<sit with pts bed saturated with urine. RN made aware. She was also in to change a Rt hip bandage during tx (pt reports he has Eczema and scratches). Mod A for squat pivot<w/c with vcs for hand placement to minimize Lt pushing during transfer. Pt able to readjust hips in w/c with vcs. Started with LB self care sit<stand at the sink, using mirror for visual feedback in regards to Lt pushing. Pt required vcs to not hold onto the faucet when standing, vcs also for neutral midline due to Lt lean at times. Mod A of 1 for standing balance with B UE support on the sink. Pt stable enough for OT to assist him with pericare in the back at this time. Max A for standing balance when pt attempted to complete hygiene in the front. He resumed washing while sitting for safety. Pt able to obtain figure 4 position during LB self care tasks, Min A to maintain these positions when he assisted with donning pants, socks, and sneakers. Pt required vcs to increase visual  attendance to Lt hand so affected limb could help during tasks. Mod A for sit<stand and for standing balance while OT elevated pants. CGA for donning overhead shirt with pt able to problem solve orientation himself! He did not want to brush his teeth this AM. Transitioned to bimanual training and problem solving when donning clean pillowcases on pillows for his bed. Pt required Min A to start off but able to pull fabric over pillow with B UEs and vcs. Pt remained in TIS, reclined for comfort and safety belt fastened. Spouse Russell Russell still present, note that she was present to observe throughout session.     2nd Session 1:1 tx (57 min) Pt greeted in bed with no c/o pain, declining toileting. With encouragement, pt agreeable to transfer OOB. Supervision for supine<sit with pt able to maintain sitting balance with Rt hand on baseboard while OT set up transfer. Mod A for squat pivot<TIS. He was then escorted to the dayroom. Started by working on trunk control via anterior and lateral leans to target while sitting upright and unsupported in chair. Pt was reaching with the Lt hand and close supervision assistance, Rt hand supported on armrest at this time. Transitioned to Lt NMR while guiding pt through UE/LE ROM exercises in beat to music. Pt able to tap his Lt foot with cuing and elevate his LE to OT's hand that hovered above his knee during seated marches. He needed active assist from  the Rt side for straight leg raises. Forward and overhead clapping completed with vcs to increase visual attendance to Lt side. Pt then completed 2 sets of lateral trunk rotations without back support of chair and OT holding both of his hands. He also completed 2 sit<stands at the elevated table with Mod A for power up and heavy Mod A for balance due to Lt pushing (more significant than during AM session). Provided manual and verbal cuing to improve midline. At end of session pt was returned to room, Mod A for squat pivot<bed. Pt able to  maintain sitting balance with supervision assist and neutral trunk alignment while OT doffed his sneakers! We celebrated! Supervision for returning to supine and vcs for pulling himself up in bed using the headboard. Left pt in bed with all needs within reach and bed alarm set.     Therapy Documentation Precautions:  Precautions Precautions: Fall, Other (comment) Precaution Comments: permissive HTN initially (<220/120) then normotension, L sided weakness, aspiration precautions (dysphagia 2 with thins), ; L hemiparesis Restrictions Weight Bearing Restrictions: No Vital Signs: Therapy Vitals Pulse Rate: 93 BP: (!) 115/93 ADL: ADL Upper Body Bathing: Minimal assistance Where Assessed-Upper Body Bathing: Sitting at sink Lower Body Bathing: Maximal assistance Where Assessed-Lower Body Bathing: Sitting at sink Upper Body Dressing: Maximal assistance Where Assessed-Upper Body Dressing: Sitting at sink Lower Body Dressing: Maximal assistance Where Assessed-Lower Body Dressing: Sitting at sink, Standing at sink Toileting: Not assessed Toilet Transfer: Not assessed      Therapy/Group: Individual Therapy  Dayvian Blixt A Aldine Chakraborty 07/12/2019, 12:31 PM

## 2019-07-12 NOTE — Progress Notes (Signed)
Checked pt BP 135/105; pt in no distress; voiced he was in no pain; pt stated "I rested well. I don't know why my blood pressure runs so high in the morning." Will continue to monitor.

## 2019-07-12 NOTE — Progress Notes (Signed)
Speech Language Pathology Daily Session Note  Patient Details  Name: Russell Russell MRN: [id number] Date of Birth: Nov 13, 1950  Today's Date: 07/12/2019 SLP Individual Time: 1350-1430 SLP Individual Time Calculation (min): 40 min  Short Term Goals: Week 1: SLP Short Term Goal 1 (Week 1): Patient will utilize speech intelligibility strategies with Min verbal cues to achieve ~90% intelligibility at the sentence level. SLP Short Term Goal 2 (Week 1): Patient will consume current diet with supervision verbal cues for use of swallowing compensatory strategies to reduce left anterior spillage/pocketing and oral residue. SLP Short Term Goal 3 (Week 1): Patient will demonstrate efficient mastication and complete oral clearance with trials of Dys. 3 textures over 2 sessions with Min verbal cues witout overt s/s of aspiration prior to diet upgrade. SLP Short Term Goal 4 (Week 1): Patient will name 2 physical and 2 cognitive changes with Mod verbal cues. SLP Short Term Goal 5 (Week 1): Patient will demonstrate functional problem solving for basic and familiar tasks with Mod verbal cues. SLP Short Term Goal 6 (Week 1): Patient will utilize external aids for recall of functional information with Mod verbal and visual cues.  Skilled Therapeutic Interventions:  Pt was seen for skilled ST targeting goals for speech intelligibility and dysphagia.  Pt needed mod-max assist multimodal cues to accurately return demonstration of EMST device.  Given the amount of effort required to complete task at 15 cm H2O, device was adjusted down to 12 cm H2O; however, once pt was able to establish an appropriate rhythm, SLP was able to increase resistance back up to 15 cm H2O for 25 repetitions.  Pt reported a self perceived effort level of 5/10 after completing repetitions.  SLP also facilitated the session with trials of dys 3 textures to continue working towards diet progression.  Pt demonstrated prolonged mastication and  decreased oral manipulation of advanced solids but was able to clear the oral cavity of residue post swallow with increased time.  No overt s/s of aspiration were evident with solids or liquids.  Pt was left in bed with bed alarm set and call bell within reach.  Continue per current plan of care.    Pain Pain Assessment Pain Scale: 0-10 Pain Score: 0-No pain  Therapy/Group: Individual Therapy  Ledford Goodson, Selinda Orion 07/12/2019, 3:41 PM

## 2019-07-12 NOTE — Progress Notes (Signed)
[place] PHYSICAL MEDICINE & REHABILITATION PROGRESS NOTE  Subjective/Complaints: Patient seen laying in bed this morning.  He states he slept well overnight.  She enjoyed his day of rest yesterday.  ROS: Denies CP, shortness of breath, nausea, vomiting, diarrhea.  Objective: Vital Signs: Blood pressure (!) 135/105, pulse 78, temperature 98.2 F (36.8 C), resp. rate 20, weight 58.3 kg, SpO2 98 %. No results found. Recent Labs    07/10/19 0445  WBC 3.7*  HGB 17.1*  HCT 50.2  PLT 249   No results for input(s): NA, K, CL, CO2, GLUCOSE, BUN, CREATININE, CALCIUM in the last 72 hours.  Physical Exam: BP (!) 135/105 (BP Location: Left Arm)   Pulse 78   Temp 98.2 F (36.8 C)   Resp 20   Wt 58.3 kg   SpO2 98%   BMI 18.44 kg/m  Constitutional: No distress . Vital signs reviewed. HENT: Normocephalic.  Atraumatic.  + Poor dentition. Eyes: EOMI. No discharge. Cardiovascular: No JVD. Respiratory: Normal effort.  No stridor. GI: Non-distended. Skin: Warm and dry.  Intact. Psych: Normal mood.  Normal behavior. Musc: No edema in extremities.  No tenderness in extremities. Neuro: Alert Motor: Left upper extremity: Shoulder abduction 3/5, distally 4/5 Left lower extremity: Hip flexion, knee extension 2+/5, ankle dorsiflexion 3/5, unchanged Dysarthria unchanged Left facial weakness, unchanged  Assessment/Plan: 1. Functional deficits secondary to multiple anterior and posterior right brain infarcts which require 3+ hours per day of interdisciplinary therapy in a comprehensive inpatient rehab setting.  Physiatrist is providing close team supervision and 24 hour management of active medical problems listed below.  Physiatrist and rehab team continue to assess barriers to discharge/monitor patient progress toward functional and medical goals  Care Tool:  Bathing    Body parts bathed by patient: Right arm, Left arm, Chest, Abdomen, Right upper leg, Left upper leg, Face   Body  parts bathed by helper: Front perineal area, Buttocks, Right lower leg, Left lower leg     Bathing assist Assist Level: Moderate Assistance - Patient 50 - 74%     Upper Body Dressing/Undressing Upper body dressing   What is the patient wearing?: Hospital gown only    Upper body assist Assist Level: Maximal Assistance - Patient 25 - 49%    Lower Body Dressing/Undressing Lower body dressing      What is the patient wearing?: Incontinence brief     Lower body assist Assist for lower body dressing: Maximal Assistance - Patient 25 - 49%     Toileting Toileting Toileting Activity did not occur (Clothing management and hygiene only): N/A (no void or bm)  Toileting assist Assist for toileting: Minimal Assistance - Patient > 75%     Transfers Chair/bed transfer  Transfers assist     Chair/bed transfer assist level: 2 Helpers     Locomotion Ambulation   Ambulation assist   Ambulation activity did not occur: Safety/medical concerns(L hemi, decreased postural control/balance, fatigue)  Assist level: 2 helpers Assistive device: Lite Gait Max distance: 11ft   Walk 10 feet activity   Assist  Walk 10 feet activity did not occur: Safety/medical concerns(L hemi, decreased postural control/balance, fatigue)  Assist level: 2 helpers Assistive device: Lite Gait   Walk 50 feet activity   Assist Walk 50 feet with 2 turns activity did not occur: Safety/medical concerns(L hemi, decreased postural control/balance, fatigue)         Walk 150 feet activity   Assist Walk 150 feet activity did not occur: Safety/medical concerns(L hemi, decreased  postural control/balance, fatigue)         Walk 10 feet on uneven surface  activity   Assist Walk 10 feet on uneven surfaces activity did not occur: Safety/medical concerns(L hemi, decreased postural control/balance, fatigue)         Wheelchair     Assist Will patient use wheelchair at discharge?: Yes Type of  Wheelchair: Manual    Wheelchair assist level: Dependent - Patient 0%      Wheelchair 50 feet with 2 turns activity    Assist        Assist Level: Dependent - Patient 0%   Wheelchair 150 feet activity     Assist     Assist Level: Dependent - Patient 0%      Medical Problem List and Plan: 1.  Left side weakness secondary to multiple scattered anterior and posterior right brain infarcts.  Status post loop recorder  Continue CIR 2.  Antithrombotics: -DVT/anticoagulation: Lovenox             -antiplatelet therapy: Aspirin 325 mg daily and Plavix 75 mg daily x3 months then aspirin alone 3. Pain Management: Tylenol as needed 4. Mood: Provide emotional support             -antipsychotic agents: N/A 5. Neuropsych: This patient is?  Fully capable of making decisions on his own behalf. 6. Skin/Wound Care: Routine skin checks 7. Fluids/Electrolytes/Nutrition: Routine in and outs.  BMP within acceptable range on 4/28, labs ordered for tomorrow 8.  Hypertension.  Lopressor 12.5 mg twice daily.    Diastolic elevated on 5/2, will consider nightly medications if persistent  Monitor with increased mobility 9.  Hyperlipidemia.    Lipitor 10.  Dysphagia.  Dysphagia #2 thins liquids.  Follow-up speech therapy  Advance diet as tolerated 11.  History of tobacco and alcohol use.  NicoDerm patch.  Provide counseling.    Monitor for withdrawal 12.  Moderate hypoalbuminemia  Supplement initiated on 4/28 13. Leukopenia  WBCs 3.7 on 4/30, labs ordered for tomorrow  Cont to monitor  LOS: 5 days A FACE TO FACE EVALUATION WAS PERFORMED  Sehaj Kolden Lorie Phenix 07/12/2019, 7:33 AM

## 2019-07-13 ENCOUNTER — Inpatient Hospital Stay (HOSPITAL_COMMUNITY): Payer: BC Managed Care – PPO | Admitting: Occupational Therapy

## 2019-07-13 ENCOUNTER — Inpatient Hospital Stay (HOSPITAL_COMMUNITY): Payer: BC Managed Care – PPO | Admitting: Speech Pathology

## 2019-07-13 ENCOUNTER — Inpatient Hospital Stay (HOSPITAL_COMMUNITY): Payer: BC Managed Care – PPO

## 2019-07-13 LAB — BASIC METABOLIC PANEL
Anion gap: 7 (ref 5–15)
BUN: 14 mg/dL (ref 8–23)
CO2: 25 mmol/L (ref 22–32)
Calcium: 9.4 mg/dL (ref 8.9–10.3)
Chloride: 107 mmol/L (ref 98–111)
Creatinine, Ser: 1.05 mg/dL (ref 0.61–1.24)
GFR calc Af Amer: >60 mL/min (ref >60)
GFR calc non Af Amer: >60 mL/min (ref >60)
Glucose, Bld: 102 mg/dL — ABNORMAL HIGH (ref 70–99)
Potassium: 4.2 mmol/L (ref 3.5–5.1)
Sodium: 139 mmol/L (ref 135–145)

## 2019-07-13 LAB — CBC WITH DIFFERENTIAL/PLATELET
Abs Immature Granulocytes: 0.01 10*3/uL (ref 0.00–0.07)
Basophils Absolute: 0.1 10*3/uL (ref 0.0–0.1)
Basophils Relative: 2 %
Eosinophils Absolute: 0.2 10*3/uL (ref 0.0–0.5)
Eosinophils Relative: 5 %
HCT: 49.1 % (ref 39.0–52.0)
Hemoglobin: 16.9 g/dL (ref 13.0–17.0)
Immature Granulocytes: 0 %
Lymphocytes Relative: 37 %
Lymphs Abs: 1.2 10*3/uL (ref 0.7–4.0)
MCH: 31.8 pg (ref 26.0–34.0)
MCHC: 34.4 g/dL (ref 30.0–36.0)
MCV: 92.5 fL (ref 80.0–100.0)
Monocytes Absolute: 0.5 10*3/uL (ref 0.1–1.0)
Monocytes Relative: 14 %
Neutro Abs: 1.4 10*3/uL — ABNORMAL LOW (ref 1.7–7.7)
Neutrophils Relative %: 42 %
Platelets: 234 10*3/uL (ref 150–400)
RBC: 5.31 MIL/uL (ref 4.22–5.81)
RDW: 15.5 % (ref 11.5–15.5)
WBC: 3.4 10*3/uL — ABNORMAL LOW (ref 4.0–10.5)
nRBC: 0 % (ref 0.0–0.2)

## 2019-07-13 NOTE — Progress Notes (Signed)
Occupational Therapy Session Note  Patient Details  Name: Russell Russell MRN: [id number] Date of Birth: [birth year]  Today's Date: 07/13/2019 OT Individual Time: [id number] and [id number] OT Individual Time Calculation (min): 72 min and 28 min   Short Term Goals: Week 1:  OT Short Term Goal 1 (Week 1): Pt will transfer to drop arm BSC with mod A +2 with LRAD/ method OT Short Term Goal 2 (Week 1): Pt will perform sit to stand with max cues with mod A in prep for clothing management OT Short Term Goal 3 (Week 1): Pt will be able to maintain static sitting balance in prep for ADL tasks with consistant mod A OT Short Term Goal 4 (Week 1): Pt will incorporate left UE in functional tasks with minimal cuing  Skilled Therapeutic Interventions/Progress Updates:    1) Treatment session with focus on functional transfers, sit> stand, dynamic sitting and standing balance, and LUE NMR.  Pt received upright in w/c reporting need to toilet.  Completed squat pivot transfer to drop arm BSC with mod assist and cues to place RUE on chest to decrease pushing during transfer.  Pt required +2 for clothing management pre and post toileting as pt required mod assist to maintain standing balance and 2nd person for clothing.  Transferred BSC > w/c > therapy mat with mod assist and cues for hand placement.  Engaged in sit > stand incorporating reaching to Rt to obtain items to facilitate weight shifting to Rt.  Pt able to complete sit > stand when reaching with RUE with CGA to min assist.  Modified task to engage in table top task in standing, pt requiring min assist sit > stand and then mod assist to maintain midline standing as pt with increased pushing to Lt in midline.  Utilized [employer] for [employer] and facilitation for weight shift.  Pt unable to complete table top task despite mod assist for standing balance/posture.  Engaged in weight shifting in sitting with removal of BLE support on floor to further challenge  sitting balance and trunk control.  Facilitated weight shift by reaching with LUE across midline for bean bags and then focus on motor planning when releasing bean bags to target.  Pt with increased difficulty when attempting to toss bean bags.  Returned to room and left tilted back in TIS w/c with seat belt alarm on and all needs in reach.  Wife present at bedside.  2) Treatment session with focus on trunk control in sitting and standing.  Pt received upright in w/c with no c/o pain.  Pt completed bed squat pivot transfers mod assist with cues for hand placement to decrease pushing.  Attempted to get pt in quadruped on hands and knees. Pt required max multimodal cues for motor planning and positioning, however once in quadruped pt unable to tolerate due to pain in Lt knee.  Engaged in sit > stand with min-mod assist while incorporating reaching out to Rt to facilitate weight shift.  Incorporated reaching for items at midline with use of mirror for visual feedback, pt requiring mod-max assist for midline standing due to increased pushing.  Attempted standing with RLE on yoga block to increase weight bearing through LLE, however pt with increased pushing in this stance, therefore removed block.  Squat pivot transfer mod assist back to w/c.  Pt requested to remain upright in w/c.  Therefore left semi-reclined in TIS w/c with seat belt alarm on and all needs in reach.  Pt's wife at bedside.  Therapy  Documentation Precautions:  Precautions Precautions: Fall, Other (comment) Precaution Comments: permissive HTN initially (<220/120) then normotension, L sided weakness, aspiration precautions (dysphagia 2 with thins), ; L hemiparesis Restrictions Weight Bearing Restrictions: No Pain: Pt with c/o headache.  RN notified and provided meds during session.  2) pt with no c/o pain  Therapy/Group: Individual Therapy  [name] 07/13/2019, 12:27 PM

## 2019-07-13 NOTE — Progress Notes (Signed)
[facility] PHYSICAL MEDICINE & REHABILITATION PROGRESS NOTE  Subjective/Complaints:  Pt reported doing great this AM- slept well- denies pain; bowel and bladder working well, per pt.    ROS: Pt denies SOB, abd pain, CP, N/V/C/D, and vision changes   Objective: Vital Signs: Blood pressure 102/87, pulse 92, temperature 98 F (36.7 C), temperature source Oral, resp. rate 18, weight 58.3 kg, SpO2 96 %. No results found. Recent Labs    07/13/19 0515  WBC 3.4*  HGB 16.9  HCT 49.1  PLT 234   Recent Labs    07/13/19 0515  NA 139  K 4.2  CL 107  CO2 25  GLUCOSE 102*  BUN 14  CREATININE 1.05  CALCIUM 9.4    Physical Exam: BP 102/87 (BP Location: Left Arm)   Pulse 92   Temp 98 F (36.7 C) (Oral)   Resp 18   Wt 58.3 kg   SpO2 96%   BMI 18.44 kg/m  Constitutional: sitting up in bed in dark; eating, NAD HENT: Normocephalic.  Atraumatic.  + poor dentition: oral mucosa moist Cardiovascular: RRR Respiratory: CTA b/L GI: Soft, NT, ND, (+)BS  Skin: Warm and dry.  Intact. Psych: appropriate Musc: No edema in extremities.  No tenderness in extremities. Neuro: Alert Motor: Left upper extremity: Shoulder abduction 3/5, distally 4/5 Left lower extremity: Hip flexion, knee extension 2+/5, ankle dorsiflexion 3/5, unchanged Dysarthria unchanged Left facial weakness, unchanged  Assessment/Plan: 1. Functional deficits secondary to multiple anterior and posterior right brain infarcts which require 3+ hours per day of interdisciplinary therapy in a comprehensive inpatient rehab setting.  Physiatrist is providing close team supervision and 24 hour management of active medical problems listed below.  Physiatrist and rehab team continue to assess barriers to discharge/monitor patient progress toward functional and medical goals  Care Tool:  Bathing    Body parts bathed by patient: Right arm, Left arm, Chest, Abdomen, Right upper leg, Left upper leg, Face, Front perineal area   Body parts bathed by helper: Buttocks, Right lower leg, Left lower leg     Bathing assist Assist Level: Moderate Assistance - Patient 50 - 74%     Upper Body Dressing/Undressing Upper body dressing   What is the patient wearing?: Pull over shirt    Upper body assist Assist Level: Contact Guard/Touching assist    Lower Body Dressing/Undressing Lower body dressing      What is the patient wearing?: Pants     Lower body assist Assist for lower body dressing: Minimal Assistance - Patient > 75%     Toileting Toileting Toileting Activity did not occur (Clothing management and hygiene only): N/A (no void or bm)  Toileting assist Assist for toileting: 2 Helpers     Transfers Chair/bed transfer  Transfers assist     Chair/bed transfer assist level: Moderate Assistance - Patient 50 - 74%     Locomotion Ambulation   Ambulation assist   Ambulation activity did not occur: Safety/medical concerns(L hemi, decreased postural control/balance, fatigue)  Assist level: 2 helpers Assistive device: Lite Gait Max distance: 81ft   Walk 10 feet activity   Assist  Walk 10 feet activity did not occur: Safety/medical concerns(L hemi, decreased postural control/balance, fatigue)  Assist level: 2 helpers Assistive device: Lite Gait   Walk 50 feet activity   Assist Walk 50 feet with 2 turns activity did not occur: Safety/medical concerns(L hemi, decreased postural control/balance, fatigue)         Walk 150 feet activity   Assist Walk  150 feet activity did not occur: Safety/medical concerns(L hemi, decreased postural control/balance, fatigue)         Walk 10 feet on uneven surface  activity   Assist Walk 10 feet on uneven surfaces activity did not occur: Safety/medical concerns(L hemi, decreased postural control/balance, fatigue)         Wheelchair     Assist Will patient use wheelchair at discharge?: Yes Type of Wheelchair: Manual    Wheelchair assist  level: Dependent - Patient 0%      Wheelchair 50 feet with 2 turns activity    Assist        Assist Level: Dependent - Patient 0%   Wheelchair 150 feet activity     Assist     Assist Level: Dependent - Patient 0%      Medical Problem List and Plan: 1.  Left side weakness secondary to multiple scattered anterior and posterior right brain infarcts.  Status post loop recorder  Continue CIR 2.  Antithrombotics: -DVT/anticoagulation: Lovenox             -antiplatelet therapy: Aspirin 325 mg daily and Plavix 75 mg daily x3 months then aspirin alone 3. Pain Management: Tylenol as needed 4. Mood: Provide emotional support             -antipsychotic agents: N/A 5. Neuropsych: This patient is?  Fully capable of making decisions on his own behalf. 6. Skin/Wound Care: Routine skin checks 7. Fluids/Electrolytes/Nutrition: Routine in and outs.  BMP within acceptable range on 4/28, labs ordered for tomorrow  5/3- looks good 8.  Hypertension.  Lopressor 12.5 mg twice daily.    Diastolic elevated on 5/2, will consider nightly medications if persistent  5/3- 110s/87- con't regimen  Monitor with increased mobility 9.  Hyperlipidemia.    Lipitor 10.  Dysphagia.  Dysphagia #2 thins liquids.  Follow-up speech therapy  Advance diet as tolerated 11.  History of tobacco and alcohol use.  NicoDerm patch.  Provide counseling.    Monitor for withdrawal 12.  Moderate hypoalbuminemia  Supplement initiated on 4/28 13. Leukopenia  WBCs 3.7 on 4/30, labs ordered for tomorrow  5/3- WBC 3.4-   Cont to monitor  LOS: 6 days A FACE TO FACE EVALUATION WAS PERFORMED  Jalyssa Fleisher 07/13/2019, 7:21 PM

## 2019-07-13 NOTE — Progress Notes (Addendum)
Speech Language Pathology Daily Session Note  Patient Details  Name: Russell Russell MRN: [id number] Date of Birth: 23-Jan-1951  Today's Date: 07/13/2019 SLP Individual Time: 1300-1345 SLP Individual Time Calculation (min): 45 min  Short Term Goals: Week 1: SLP Short Term Goal 1 (Week 1): Patient will utilize speech intelligibility strategies with Min verbal cues to achieve ~90% intelligibility at the sentence level. SLP Short Term Goal 2 (Week 1): Patient will consume current diet with supervision verbal cues for use of swallowing compensatory strategies to reduce left anterior spillage/pocketing and oral residue. SLP Short Term Goal 3 (Week 1): Patient will demonstrate efficient mastication and complete oral clearance with trials of Dys. 3 textures over 2 sessions with Min verbal cues witout overt s/s of aspiration prior to diet upgrade. SLP Short Term Goal 4 (Week 1): Patient will name 2 physical and 2 cognitive changes with Mod verbal cues. SLP Short Term Goal 5 (Week 1): Patient will demonstrate functional problem solving for basic and familiar tasks with Mod verbal cues. SLP Short Term Goal 6 (Week 1): Patient will utilize external aids for recall of functional information with Mod verbal and visual cues.  Skilled Therapeutic Interventions: Pt was seen for skilled ST targeting dysphagia and cognition. Pt's wife was present throughout session. Pt independently oriented to place and situation, but required Min A verbal and visual cues for use of calendar as aid to orient to time. He completed a basic trail making task with overall extra time and Min A verbal cues for problem solving. During a novel basic card task (11 up), he required Mod A for recall and problem solving throughout. He did recall general method and 1 safety precaution pertaining to type of functional transfer he uses with therapy (slide board).  SLP provided a dysphagia 3 (mechanical soft) solid texture snack to assess oral phase  swallow improvements. Pt did take large bites and required cues to slow his rate of intake, but not overt s/sx aspiration were observed and his mastication was functional. No evidence of oral residue or pocketing noted. Would recommend a larger portion trial prior to full upgrade at next session. Continue current diet for now. Pt also completed 25 reps of EMST exercises with Philips device set to 15cm H2O with overall Mod A multimodal cues required for accuracy in use and to initiate appropriate rest breaks based on his presentation. Pt left sitting in chair with alarm set and needs met, wife still present. Continue per current plan of care.        Pain Pain Assessment Pain Scale: 0-10 Pain Score: 0-No pain  Therapy/Group: Individual Therapy  Arbutus Leas 07/13/2019, 7:16 AM

## 2019-07-13 NOTE — Progress Notes (Signed)
Physical Therapy Session Note  Patient Details  Name: Russell Russell MRN: [id number] Date of Birth: 01/10/1951  Today's Date: 07/13/2019 PT Individual Time: 0900-0959 PT Individual Time Calculation (min): 59 min   Short Term Goals: Week 1:  PT Short Term Goal 1 (Week 1): pt will transfer supine<>sitting EOB with mod A PT Short Term Goal 2 (Week 1): Pt will transfer sit<>stand with LRAD max A PT Short Term Goal 3 (Week 1): Pt will transfer bed<>chair with LRAD max A  Skilled Therapeutic Interventions/Progress Updates:   Received pt supine in bed, pt agreeable to therapy, and denied any pain during today's session. Pt's wife present at bedside. Session focused on functional mobility/transfers, LE strength, dynamic standing balance/coordination, simulated car transfers, NMR, motor control/planning, and improved activity tolerance. Pt donned pants in supine with min A and increased time. Pt transferred supine<>sitting EOB from flat bed with CGA and verbal cues for logroll technique. Pt transferred bed<>WC squat<>pivot to R with mod A. Pt transported to ortho gym in [facility] total assist. Pt performed simulated car transfer with mod A and able to manage LEs with supervision. Pt transferred sit<>stand min A in standing frame x 3 trials. Worked on dynamic standing balance, reaching, and weight shifting to R with and without UE support 2x10 using mirror for visual feedback. Worked on active L knee extension and maintaining quad activation 5x10 second hold. Pt worked on dynamic standing balance reaching for horseshoes with L UE 3x5 reps. +2 assist needed for holding horseshoes at various angles for pt to reach. Pt required maximal verbal, visual, and tactile cues for pelvic rotation, trunk alignment, midline orientation, weight shifting, and L knee extension, all to avoid pushing to L any time he was in standing position. Pt required multiple rest breaks throughout session due to increased fatigue.  Pt transferred  sit<>stand min A with L UE supported around therapist with +2 assist for safety using mirror for visual feedback. While standing worked on midline orientation, pelvic rotation and alignment, L knee extension, and weight shifting to R to reach for therapy tech's hand using mirror for visual feedback. Pt with increased fear of falling due to sanding frame now not being in front of him for support; pt frequently trying to reach back and grab WC armrest for support to sit. Pt transported back to room in TIS WC total assist. Concluded session with pt semi-reclined in TIS WC, needs within reach, and seatbelt alarm on. Therapist provided ensure for pt.   Therapy Documentation Precautions:  Precautions Precautions: Fall, Other (comment) Precaution Comments: permissive HTN initially (<220/120) then normotension, L sided weakness, aspiration precautions (dysphagia 2 with thins), ; L hemiparesis Restrictions Weight Bearing Restrictions: No  Therapy/Group: Individual Therapy Alfonse Alpers PT, DPT   07/13/2019, 7:27 AM

## 2019-07-14 ENCOUNTER — Inpatient Hospital Stay (HOSPITAL_COMMUNITY): Payer: BC Managed Care – PPO | Admitting: Occupational Therapy

## 2019-07-14 ENCOUNTER — Inpatient Hospital Stay (HOSPITAL_COMMUNITY): Payer: BC Managed Care – PPO | Admitting: Physical Therapy

## 2019-07-14 NOTE — Progress Notes (Signed)
Physical Therapy Session Note  Patient Details  Name: Loren Aprahamian MRN: [id number] Date of Birth: 1950/09/26  Today's Date: 07/14/2019 PT Individual Time: 1300-1411 PT Individual Time Calculation (min): 71 min   Short Term Goals: Week 1:  PT Short Term Goal 1 (Week 1): pt will transfer supine<>sitting EOB with mod A PT Short Term Goal 2 (Week 1): Pt will transfer sit<>stand with LRAD max A PT Short Term Goal 3 (Week 1): Pt will transfer bed<>chair with LRAD max A  Skilled Therapeutic Interventions/Progress Updates:   Received pt sitting in recliner, pt agreeable to therapy after extensive convincing, and denied any pain during session. Session focused on functional mobility/transfers, LE strength, dynamic standing balance/coordination, gait training, NMR, motor control/planning, and improved activity tolerance. Pt transferred recliner<>TIS WC squat<>pivot to L mod A. Pt transported to dayroom in [facility] total assist for time management purposes. Donned LiteGait harness in sitting with +2 assist and transferred sit<>stand min A and completed donning harness with +2 assist. Pt ambulated 49ft x 1 trial and 33ft x 1 trial with +2/+3 assist. Pt required manual facilitation to block L knee buckling and hyperextension, to shift hips anteriorly, and for lateral weight shifting when stepping. Pt required max verbal cues for upright posture, increased R LE step length, and weight bearing onto R LE. Pt required multiple rest breaks throughout session due to increased fatigue. Pt transferred TIS WC<>mat mod A to R. Worked on weight bearing through L UE and pushing up into elbow extension 2x6 reps with min A and verbal cues for technique and scapular humoral control. Pt transferred mat<>TIS squat<>pivot to L and was transported back to room in TIS [facility] total assist. Pt transferred squat<>pivot TIS WC<>recliner mod A. Concluded session with pt semi-reclined in recliner, needs within reach, and seatbelt alarm on.  Therapist provided ensure for pt.   Therapy Documentation Precautions:  Precautions Precautions: Fall, Other (comment) Precaution Comments: permissive HTN initially (<220/120) then normotension, L sided weakness, aspiration precautions (dysphagia 2 with thins), ; L hemiparesis Restrictions Weight Bearing Restrictions: No  Therapy/Group: Individual Therapy Alfonse Alpers PT, DPT   07/14/2019, 7:29 AM

## 2019-07-14 NOTE — Progress Notes (Addendum)
Occupational Therapy Session Note  Patient Details  Name: Russell Russell MRN: [id number] Date of Birth: 1950/07/14  Today's Date: 07/14/2019 OT Individual Time: 3790-2409 OT Individual Time Calculation (min): 58 min    Short Term Goals: Week 1:  OT Short Term Goal 1 (Week 1): Pt will transfer to drop arm BSC with mod A +2 with LRAD/ method OT Short Term Goal 2 (Week 1): Pt will perform sit to stand with max cues with mod A in prep for clothing management OT Short Term Goal 3 (Week 1): Pt will be able to maintain static sitting balance in prep for ADL tasks with consistant mod A OT Short Term Goal 4 (Week 1): Pt will incorporate left UE in functional tasks with minimal cuing  Skilled Therapeutic Interventions/Progress Updates:  Patient met lying supine in bed in agreement with OT treatment session with focus on NMR, functional transfers, and self-care re-education as detailed below. Patient reporting 0/10 pain at rest and with activity. Supine to EOB with CGA. Patient able to don LB clothing in figure-4 position with assistance to bring LLE over RLE and cuing for hemi technique with Min A. Sit to stand from EOB with Min A in prep for hiking pants over hips with cues for orientation to midline. Squat-pivot transfer to [place] on R with Min A x2 trials and to TIS wc on L with Mod A x2 trials. Total A for wc transport to therapy gym. NMR with 1lb weighted dowel in supine with assist for normalized movements in all planes of motion. Patient engaged in peg board activity with focus on [facility] and [employer] with wrist extension and lateral pinch. Patient required cueing and occasional hand over hand assist for normalized movement. Session concluded with patient seated in recliner with belt alarm activated, call bell within reach, and all needs met.    Therapy Documentation Precautions:  Precautions Precautions: Fall, Other (comment) Precaution Comments: permissive HTN initially (<220/120) then normotension, L  sided weakness, aspiration precautions (dysphagia 2 with thins), ; L hemiparesis Restrictions Weight Bearing Restrictions: No  Therapy/Group: Individual Therapy  [name] R [place]-Davis 07/14/2019, 7:55 AM

## 2019-07-14 NOTE — Plan of Care (Signed)
  Problem: Consults Goal: RH STROKE PATIENT EDUCATION Description: See Patient Education module for education specifics  Outcome: Progressing Goal: Nutrition Consult-if indicated Outcome: Progressing   Problem: RH SKIN INTEGRITY Goal: RH STG SKIN FREE OF INFECTION/BREAKDOWN Description: Skin free of breakdown and infection with Mod assist Outcome: Progressing Goal: RH STG MAINTAIN SKIN INTEGRITY WITH ASSISTANCE Description: STG Maintain Skin Integrity With Assistance. MOD Outcome: Progressing   Problem: RH SAFETY Goal: RH STG ADHERE TO SAFETY PRECAUTIONS W/ASSISTANCE/DEVICE Description: STG Adhere to Safety Precautions With Assistance/Device. Mod Outcome: Progressing Goal: RH STG DECREASED RISK OF FALL WITH ASSISTANCE Description: STG Decreased Risk of Fall With Assistance. Mod assist Outcome: Progressing   Problem: RH KNOWLEDGE DEFICIT Goal: RH STG INCREASE KNOWLEDGE OF HYPERTENSION Description: Patient/family will be able to describe management of hypertension including medication, diet, and lifestyle interventions with cues/handouts Outcome: Progressing Goal: RH STG INCREASE KNOWLEDGE OF DYSPHAGIA/FLUID INTAKE Description: Patient/Family will be able to describe and demonstrate safe swallowing techniques and identify foods in D2 diet scope with cues/handouts Outcome: Progressing Goal: RH STG INCREASE KNOWLEGDE OF HYPERLIPIDEMIA Description: Patient/family will be able to describe management of hyperlipidemia including medication, diet, and lifestyle interventions with cues/handouts Outcome: Progressing Goal: RH STG INCREASE KNOWLEDGE OF STROKE PROPHYLAXIS Description: Patient/family will be able to describe stroke prophylaxis including medication, diet, and lifestyle interventions with cues/handouts Outcome: Progressing

## 2019-07-14 NOTE — Progress Notes (Signed)
Physical Therapy Weekly Progress Note  Patient Details  Name: Russell Russell MRN: [id number] Date of Birth: [birth year]  Beginning of progress report period: July 08, 2019 End of progress report period: Jul 14, 2019  Patient has met 3 of 3 short term goals. Pt demonstrates improvements in functional mobility/transfers, LE strength, dynamic standing balance/coordination, and endurance. Pt currently requires CGA for bed mobility, min A to transfer sit<>stand, mod A for squat<>pivot transfers, and +2/+3 assist to ambulate 47f with LiteGait. Pt continues to be limited by strong pushing to L, decreased L LE control/strength, and difficulty with maintaining head control, postural alignment and midline orientation, and pelvic alignment in standing.  Patient continues to demonstrate the following deficits muscle weakness, impaired timing and sequencing, unbalanced muscle activation, decreased coordination and decreased motor planning and decreased standing balance, decreased postural control, hemiplegia and decreased balance strategies and therefore will continue to benefit from skilled PT intervention to increase functional independence with mobility.  Patient progressing toward long term goals..  Continue plan of care.  PT Short Term Goals Week 1:  PT Short Term Goal 1 (Week 1): pt will transfer supine<>sitting EOB with mod A PT Short Term Goal 1 - Progress (Week 1): Met PT Short Term Goal 2 (Week 1): Pt will transfer sit<>stand with LRAD max A PT Short Term Goal 2 - Progress (Week 1): Met PT Short Term Goal 3 (Week 1): Pt will transfer bed<>chair with LRAD max A PT Short Term Goal 3 - Progress (Week 1): Met Week 2:  PT Short Term Goal 1 (Week 2): pt will transfer bed<>chair min A PT Short Term Goal 2 (Week 2): pt will transfer sit<>stand CGA PT Short Term Goal 3 (Week 2): Pt will ambulate 26fwith LRAD max A of 1  Skilled Therapeutic Interventions/Progress Updates:  Ambulation/gait  training;Discharge planning;Functional mobility training;Psychosocial support;Therapeutic Activities;Balance/vestibular training;Disease management/prevention;Neuromuscular re-education;Therapeutic Exercise;Wheelchair propulsion/positioning;Cognitive remediation/compensation;DME/adaptive equipment instruction;Splinting/orthotics;UE/LE Strength taining/ROM;Pain management;Community reintegration;Functional electrical stimulation;Patient/family education;Stair training;UE/LE Coordination activities   Therapy Documentation Precautions:  Precautions Precautions: Fall, Other (comment) Precaution Comments: permissive HTN initially (<220/120) then normotension, L sided weakness, aspiration precautions (dysphagia 2 with thins), ; L hemiparesis Restrictions Weight Bearing Restrictions: No  AnAlfonse AlpersT, DPT  07/14/2019, 4:14 PM

## 2019-07-14 NOTE — Progress Notes (Signed)
[place] PHYSICAL MEDICINE & REHABILITATION PROGRESS NOTE  Subjective/Complaints: Patient seen laying in bed this morning, eating breakfast.  He states he slept well overnight.  Nurse tech at bedside providing supervision for meals.  [id number] CP, SOB, N/V/D  Objective: Vital Signs: Blood pressure (!) 136/104, pulse 78, temperature 98.4 F (36.9 C), temperature source Oral, resp. rate 17, weight 58.3 kg, SpO2 100 %. No results found. Recent Labs    07/13/19 0515  WBC 3.4*  HGB 16.9  HCT 49.1  PLT 234   Recent Labs    07/13/19 0515  NA 139  K 4.2  CL 107  CO2 25  GLUCOSE 102*  BUN 14  CREATININE 1.05  CALCIUM 9.4    Physical Exam: BP (!) 136/104 (BP Location: Right Arm)   Pulse 78   Temp 98.4 F (36.9 C) (Oral)   Resp 17   Wt 58.3 kg   SpO2 100%   BMI 18.44 kg/m  Constitutional: No distress . Vital signs reviewed. HENT: Normocephalic.  Atraumatic.  + Poor dentition. Eyes: EOMI. No discharge. Cardiovascular: No JVD. Respiratory: Normal effort.  No stridor. GI: Non-distended. Skin: Warm and dry.  Intact. Psych: Normal mood.  Normal behavior. Musc: No edema in extremities.  No tenderness in extremities. Neuro: Alert Motor: Left upper extremity: Shoulder abduction 3/5, distally 4/5, unchanged Left lower extremity: Hip flexion, knee extension 2+/5, ankle dorsiflexion 4/5 Dysarthria unchanged Left facial weakness, unchanged  Assessment/Plan: 1. Functional deficits secondary to multiple anterior and posterior right brain infarcts which require 3+ hours per day of interdisciplinary therapy in a comprehensive inpatient rehab setting.  Physiatrist is providing close team supervision and 24 hour management of active medical problems listed below.  Physiatrist and rehab team continue to assess barriers to discharge/monitor patient progress toward functional and medical goals  Care Tool:  Bathing    Body parts bathed by patient: Right arm, Left arm, Chest,  Abdomen, Right upper leg, Left upper leg, Face, Front perineal area   Body parts bathed by helper: Buttocks, Right lower leg, Left lower leg     Bathing assist Assist Level: Moderate Assistance - Patient 50 - 74%     Upper Body Dressing/Undressing Upper body dressing   What is the patient wearing?: Pull over shirt    Upper body assist Assist Level: Contact Guard/Touching assist    Lower Body Dressing/Undressing Lower body dressing      What is the patient wearing?: Pants     Lower body assist Assist for lower body dressing: Minimal Assistance - Patient > 75%     Toileting Toileting Toileting Activity did not occur (Clothing management and hygiene only): N/A (no void or bm)  Toileting assist Assist for toileting: 2 Helpers     Transfers Chair/bed transfer  Transfers assist     Chair/bed transfer assist level: Moderate Assistance - Patient 50 - 74%     Locomotion Ambulation   Ambulation assist   Ambulation activity did not occur: Safety/medical concerns(L hemi, decreased postural control/balance, fatigue)  Assist level: 2 helpers Assistive device: Lite Gait Max distance: 65ft   Walk 10 feet activity   Assist  Walk 10 feet activity did not occur: Safety/medical concerns(L hemi, decreased postural control/balance, fatigue)  Assist level: 2 helpers Assistive device: Lite Gait   Walk 50 feet activity   Assist Walk 50 feet with 2 turns activity did not occur: Safety/medical concerns(L hemi, decreased postural control/balance, fatigue)         Walk 150 feet activity  Assist Walk 150 feet activity did not occur: Safety/medical concerns(L hemi, decreased postural control/balance, fatigue)         Walk 10 feet on uneven surface  activity   Assist Walk 10 feet on uneven surfaces activity did not occur: Safety/medical concerns(L hemi, decreased postural control/balance, fatigue)         Wheelchair     Assist Will patient use wheelchair  at discharge?: Yes Type of Wheelchair: Manual    Wheelchair assist level: Dependent - Patient 0%      Wheelchair 50 feet with 2 turns activity    Assist        Assist Level: Dependent - Patient 0%   Wheelchair 150 feet activity     Assist     Assist Level: Dependent - Patient 0%      Medical Problem List and Plan: 1.  Left side weakness secondary to multiple scattered anterior and posterior right brain infarcts.  Status post loop recorder  Continue CIR 2.  Antithrombotics: -DVT/anticoagulation: Lovenox             -antiplatelet therapy: Aspirin 325 mg daily and Plavix 75 mg daily x3 months then aspirin alone 3. Pain Management: Tylenol as needed 4. Mood: Provide emotional support             -antipsychotic agents: N/A 5. Neuropsych: This patient is?  Fully capable of making decisions on his own behalf. 6. Skin/Wound Care: Routine skin checks 7. Fluids/Electrolytes/Nutrition: Routine in and outs.  BMP within acceptable range on 5/3 8.  Hypertension.  Lopressor 12.5 mg twice daily.    Diastolic elevated on 5/4, will consider medications if persistent  Monitor with increased mobility 9.  Hyperlipidemia.    Lipitor 10.  Dysphagia.  Dysphagia #2 thins liquids.  Follow-up speech therapy  Advance diet as tolerated 11.  History of tobacco and alcohol use.  NicoDerm patch.  Provide counseling.    Monitor for withdrawal 12.  Moderate hypoalbuminemia  Supplement initiated on 4/28 13. Leukopenia  WBCs 3.4 on 5/3  Cont to monitor  LOS: 7 days A FACE TO FACE EVALUATION WAS PERFORMED  Ankit Lorie Phenix 07/14/2019, 8:35 AM

## 2019-07-14 NOTE — Progress Notes (Signed)
Occupational Therapy Session Note  Patient Details  Name: Russell Russell MRN: [id number] Date of Birth: 15-Jun-1950  Today's Date: 07/14/2019 OT Individual Time: 1110-1204 OT Individual Time Calculation (min): 54 min    Short Term Goals: Week 1:  OT Short Term Goal 1 (Week 1): Pt will transfer to drop arm BSC with mod A +2 with LRAD/ method OT Short Term Goal 2 (Week 1): Pt will perform sit to stand with max cues with mod A in prep for clothing management OT Short Term Goal 3 (Week 1): Pt will be able to maintain static sitting balance in prep for ADL tasks with consistant mod A OT Short Term Goal 4 (Week 1): Pt will incorporate left UE in functional tasks with minimal cuing  Skilled Therapeutic Interventions/Progress Updates:    Treatment session with focus on trunk control in sitting, standing, sit > stand, and functional transfers, as well as LUE NMR.  Pt received upright in recliner attempting to remove seat belt alarm and yelling out for his wife.  Attempted to reorient pt to time and situation, pt continuing to attempt to get up from chair and find his wife who is "in that room" as he gestured across the hall.  Able to encourage pt to call his wife on the phone, she answered and reports that she is at work.  She was able to calm him down and stated that she would be by after work.  Pt then able to participate in therapy session.  Pt completed squat pivot transfers mod assist to Rt and Lt throughout session with cues for hand placement to decrease pushing.  Engaged in sit > stand with focus on trunk control and weight shifting to Rt with reaching outside BOS to facilitate weight shift and decrease pushing.  Increased challenge to engage in sit > stand at midline and even when reaching to Lt.  Pt with increased pushing at midline at with rotation Lt, inability to motor plan Lt rotation just increased pushing.  Engaged in ball toss to basketball goal in sitting with focus on sitting balance and LUE  NMR.  Hand over hand faded to mod cues for hand placement to increase ROM and purposeful movement.  Pt completed reaching for clothespins with LUE, able to reach in midline but with increased difficulty when reaching up.  Returned to room and returned to recliner and left with seat belt alarm on and all needs in reach.  Therapy Documentation Precautions:  Precautions Precautions: Fall, Other (comment) Precaution Comments: permissive HTN initially (<220/120) then normotension, L sided weakness, aspiration precautions (dysphagia 2 with thins), ; L hemiparesis Restrictions Weight Bearing Restrictions: No General:   Vital Signs: Therapy Vitals Temp: 97.6 F (36.4 C) Temp Source: Oral Pulse Rate: 96 Resp: 17 BP: 102/84 Patient Position (if appropriate): Sitting Oxygen Therapy SpO2: 99 % O2 Device: Room Air Pain:  Pt with no c/o pain   Therapy/Group: Individual Therapy  [name] 07/14/2019, 3:15 PM

## 2019-07-15 ENCOUNTER — Inpatient Hospital Stay (HOSPITAL_COMMUNITY): Payer: BC Managed Care – PPO | Admitting: Occupational Therapy

## 2019-07-15 ENCOUNTER — Inpatient Hospital Stay (HOSPITAL_COMMUNITY): Payer: BC Managed Care – PPO | Admitting: *Deleted

## 2019-07-15 ENCOUNTER — Inpatient Hospital Stay (HOSPITAL_COMMUNITY): Payer: BC Managed Care – PPO

## 2019-07-15 ENCOUNTER — Inpatient Hospital Stay (HOSPITAL_COMMUNITY): Payer: BC Managed Care – PPO | Admitting: Speech Pathology

## 2019-07-15 ENCOUNTER — Inpatient Hospital Stay (HOSPITAL_COMMUNITY): Payer: BC Managed Care – PPO | Admitting: Physical Therapy

## 2019-07-15 NOTE — Progress Notes (Signed)
Occupational Therapy Weekly Progress Note  Patient Details  Name: Russell Russell MRN: [id number] Date of Birth: [birth year]  Beginning of progress report period: July 08, 2019 End of progress report period: Jul 15, 2019  Today's Date: 07/15/2019 OT Individual Time: 1400-1500 OT Individual Time Calculation (min): 60 min    Patient has met 4 of 4 short term goals.  Pt is making steady progress towards goals.  Pt is demonstrating ability to carryover education in regards to pushing tendencies during transfers and sitting balance.  Pt continues to demonstrate increased Lt pushing tendencies in dynamic sitting and standing tasks.  Utilization of mirror has demonstrated effectiveness to decrease pushing during sitting and standing activities.  Pt currently requires mod assist for squat pivot transfers and min-mod assist for sit > stand.  Up to max assist for standing balance depending on level of dynamic challenge.  Pt is able to incorporate LUE into functional reaching and grasping tasks.  Patient continues to demonstrate the following deficits: muscle weakness, decreased cardiorespiratoy endurance, impaired timing and sequencing, abnormal tone, unbalanced muscle activation, motor apraxia, decreased coordination and decreased motor planning, decreased visual perceptual skills, decreased midline orientation, decreased attention to left and decreased motor planning, decreased attention, decreased awareness, decreased problem solving, decreased safety awareness and decreased memory and decreased sitting balance, decreased standing balance, decreased postural control, hemiplegia and decreased balance strategies and therefore will continue to benefit from skilled OT intervention to enhance overall performance with BADL and Reduce care partner burden.  Patient progressing toward long term goals..  Continue plan of care.  OT Short Term Goals Week 1:  OT Short Term Goal 1 (Week 1): Pt will transfer to drop arm  BSC with mod A +2 with LRAD/ method OT Short Term Goal 1 - Progress (Week 1): Met OT Short Term Goal 2 (Week 1): Pt will perform sit to stand with max cues with mod A in prep for clothing management OT Short Term Goal 2 - Progress (Week 1): Met OT Short Term Goal 3 (Week 1): Pt will be able to maintain static sitting balance in prep for ADL tasks with consistant mod A OT Short Term Goal 3 - Progress (Week 1): Met OT Short Term Goal 4 (Week 1): Pt will incorporate left UE in functional tasks with minimal cuing OT Short Term Goal 4 - Progress (Week 1): Met Week 2:  OT Short Term Goal 1 (Week 2): Pt will complete bathing at sit > stand with mod assist OT Short Term Goal 2 (Week 2): Pt will don pants with mod assist sit > stand OT Short Term Goal 3 (Week 2): Pt will complete toilet transfers mod assist 3 out of 5 times to demonstate consistency  Skilled Therapeutic Interventions/Progress Updates:    Treatment session with focus on trunk control and dynamic sitting and standing balance.  Pt received upright in w/c, pleasant and agreeable to therapy session.  Pt completed squat pivot transfers mod assist throughout session, continues to require cues for Rt hand placement to decrease pushing tendencies.  Engaged in sit > stand with focus on reaching to Rt to facilitate weight shift and decrease pushing to Lt.  Pt able to maintain midline standing with mod assist and use of mirror for visual feedback when reaching to Rt to obtain items and bring them back to midline.  Increased challenge to maintaining midline in dynamic squat/partial stand with pt demonstrating increased pushing to Lt with inability to weight shift to Rt despite max multimodal cues and  facilitation.  Engaged in functional reaching with LUE to obtain beanbags on Rt and weight shift to Lt to deposit beanbags in box while focusing on controlled weight shift.  Utilized resistive clothespins with pt able to manage clothespins with Lt hand to remove  from target and weight shift to Rt to pass to tech.  Min-mod assist for dynamic standing balance when incorporating weight shifting midline <> Rt.  Returned to room and transferred back to recliner mod assist.  Pt left with seat belt alarm on and all needs in reach.  Therapy Documentation Precautions:  Precautions Precautions: Fall, Other (comment) Precaution Comments: permissive HTN initially (<220/120) then normotension, L sided weakness, aspiration precautions (dysphagia 2 with thins), ; L hemiparesis Restrictions Weight Bearing Restrictions: No General:   Vital Signs: Therapy Vitals Temp: 98.1 F (36.7 C) Temp Source: Oral Pulse Rate: 81 Resp: 16 BP: (!) 114/97 Patient Position (if appropriate): Sitting Oxygen Therapy SpO2: 99 % O2 Device: Room Air Pain:  Pt with no c/o pain   Therapy/Group: Individual Therapy  [name] 07/15/2019, 4:03 PM

## 2019-07-15 NOTE — Evaluation (Signed)
Recreational Therapy Assessment and Plan  Patient Details  Name: Russell Russell MRN: [id number] Date of Birth: 1951/01/31 Today's Date: 07/15/2019  Rehab Potential:   Good ELOS: d/c 5/26  Assessment Problem List:      Patient Active Problem List   Diagnosis Date Noted  . Leukopenia   . Benign essential HTN   . Hypoalbuminemia   . Cerebrovascular accident (CVA) of right basal ganglia ([place]) 07/07/2019  . Acute ischemic stroke ([place])   . Alcohol abuse   . Dyslipidemia   . Dysphagia, post-stroke   . Polycythemia   . Stroke ([place]) 06/30/2019  . Prediabetes 12/23/2016  . Elevated LFTs 12/23/2016  . Pure hypercholesterolemia 12/21/2016  . Alcoholism ([place]) 12/21/2016  . Eczema 09/18/2013  . Essential hypertension 09/18/2013  . Gastroesophageal reflux disease without esophagitis 09/18/2013  . Tobacco abuse 09/18/2013    Past Medical History:      Past Medical History:  Diagnosis Date  . Alcohol abuse    drinks 1 gallon of Gin every weekend, nothing during the week x >15 yrs  . Elevated transaminase level    +elev bili: abd u/s 06/2014 showed stable small hepatic hemangiomas, o/w normal.  . Essential hypertension   . GERD (gastroesophageal reflux disease)   . Hyperlipidemia 03/2014   Atorv started-chol improved  . Impaired fasting glucose 03/2014  . Nephrolithiasis   . PUD (peptic ulcer disease)   . Tobacco dependence    chantix: "psych effects"   Past Surgical History:  Past Surgical History:  Procedure Laterality Date  . COLONOSCOPY  2005   Recall 10 yrs ([place])  . LOOP RECORDER INSERTION N/A 07/03/2019   Procedure: LOOP RECORDER INSERTION;  Surgeon: Thompson Grayer, MD;  Location: [facility];  Service: Cardiovascular;  Laterality: N/A;  . removal of kidney stones  2006   cystoscopic--removed from ureter.  No prob since.    Assessment & Plan Clinical Impression: Patient is a 69 y.o. year old male with history of alcohol/tobacco  abuse, hyperlipidemia. Per chart review and wife due to memory difficulties patient lives with spouse currently working but planning on retiring in June 2021. 1 level home one-step to entry. Reportedly independent prior to admission. He presented in the late evening of 06/30/2019 with left hemiparesis. Cranial CT scan unremarkable for acute intracranial process. Patient did not receive TPA. CT angiogram head and neck no emergent large vessel occlusion or high-grade stenosis. MRI showed multiple scattered acute ischemic infarcts involving the posterior right basal ganglia right cerebral hemisphere and right cerebellum. Echocardiogram with ejection fraction of 60%. No source of embolus. Lower extremity Dopplers negative. Admission chemistries unremarkable, alcohol level 18, SARS coronavirus negative. Currently maintained on aspirin 325 mg and Plavix for CVA prophylaxis x3 months then aspirin alone. Patient did undergo loop recorder placement. Subcutaneous Lovenox for DVT prophylaxis. He is on a dysphagia #2 thin liquid diet. Patient transferred to CIR on 07/07/2019 .    Pt presents with decreased activity tolerance, decreased functional mobility, decreased balance, decreased coordination, decreased vision, decreased midline orientation, left inattention, decreased attention, decreased awareness, decreased problem solving, decreased safety awareness and decreased memory Limiting pt's independence with leisure/community pursuits.   Plan Min 1 TR session >20 minutes per week during LOS  Recommendations for other services: None   Discharge Criteria: Patient will be discharged from TR if patient refuses treatment 3 consecutive times without medical reason.  If treatment goals not met, if there is a change in medical status, if patient makes no progress  towards goals or if patient is discharged from hospital.  The above assessment, treatment plan, treatment alternatives and goals were discussed  and mutually agreed upon: by patient  Deal 07/15/2019, 3:29 PM

## 2019-07-15 NOTE — Progress Notes (Signed)
Speech Language Pathology Weekly Progress and Session Note  Patient Details  Name: Russell Russell MRN: [id number] Date of Birth: 1950/08/08  Beginning of progress report period: July 08, 2019 End of progress report period: Jul 15, 2019  Today's Date: 07/15/2019 SLP Individual Time: 0830-0929 SLP Individual Time Calculation (min): 59 min  Short Term Goals: Week 1: SLP Short Term Goal 1 (Week 1): Patient will utilize speech intelligibility strategies with Min verbal cues to achieve ~90% intelligibility at the sentence level. SLP Short Term Goal 1 - Progress (Week 1): Progressing toward goal SLP Short Term Goal 2 (Week 1): Patient will consume current diet with supervision verbal cues for use of swallowing compensatory strategies to reduce left anterior spillage/pocketing and oral residue. SLP Short Term Goal 2 - Progress (Week 1): Met SLP Short Term Goal 3 (Week 1): Patient will demonstrate efficient mastication and complete oral clearance with trials of Dys. 3 textures over 2 sessions with Min verbal cues witout overt s/s of aspiration prior to diet upgrade. SLP Short Term Goal 3 - Progress (Week 1): Met SLP Short Term Goal 4 (Week 1): Patient will name 2 physical and 2 cognitive changes with Mod verbal cues. SLP Short Term Goal 4 - Progress (Week 1): Progressing toward goal SLP Short Term Goal 5 (Week 1): Patient will demonstrate functional problem solving for basic and familiar tasks with Mod verbal cues. SLP Short Term Goal 5 - Progress (Week 1): Met SLP Short Term Goal 6 (Week 1): Patient will utilize external aids for recall of functional information with Mod verbal and visual cues. SLP Short Term Goal 6 - Progress (Week 1): Progressing toward goal    New Short Term Goals: Week 2: SLP Short Term Goal 1 (Week 2): Patient will utilize speech intelligibility strategies with Min verbal cues to achieve ~90% intelligibility at the sentence level. SLP Short Term Goal 2 (Week 2): Pt will  demonstrate efficient mastication and oral clearance of upgraded dysphagia 3 texture diet with Supervision A verbal cues for use of swallow strategies for oral clarance and to minimize anterior loss SLP Short Term Goal 3 (Week 2): Patient will name 2 physical and 2 cognitive changes with Mod verbal cues. SLP Short Term Goal 4 (Week 2): Patient will demonstrate functional problem solving for basic and familiar tasks with Min verbal cues. SLP Short Term Goal 5 (Week 2): Patient will utilize external aids for recall of functional information with Mod verbal and visual cues.  Weekly Progress Updates: Pt has made functional gains and met 3 out of 6 short term goals. Pt is currently ~min-mod assist for basic tasks due to cognitive impairments impacting his short term memory, intellectual and emergent awareness, basic problem solving, as well as mild dysarthria and dysphaiga. Pt is ~80-85% intelligible in conversation and required Min A for use of strategies for compensation. Pt's diet was upgraded to dysphagia 3 (mechanical soft) textures today with thin liquids, and is very close to goal level for use of compensatory strategies for oral clearance and minimizing aspiration risk. Pt has demonstrated improved oral phase swallow function, independence with use of swallow strategies, basic problem solving, and orientation. Pt and family education is ongoing. Pt would continue to benefit from skilled ST while inpatient in order to maximize functional independence and reduce burden of care prior to discharge. Anticipate that pt will need 24/7 supervision at discharge in addition to [place] follow up at next level of care.     Intensity: Minumum of 1-2 x/day, 30 to  90 minutes Frequency:   Duration/Length of Stay: 3-4 weeks Treatment/Interventions: Cognitive remediation/compensation;Dysphagia/aspiration precaution training;Internal/external aids;Speech/Language facilitation;Therapeutic Activities;Cueing  hierarchy;Environmental controls;Functional tasks;Patient/family education   Daily Session  Skilled Therapeutic Interventions: Pt was seen for skilled ST targeting dysphagia and cognitive goals. SLP recommended RN administer medications whole in puree as opposed to crushed to assess potential for advancement. Pt demonstrated good AP transit and oral clearance of whole small/medium pills in applesauce when administered 1 at a time. Reduced posterior propulsion and bolus cohesion noted with a very large pill, but when broken in half AP transit swift and efficient, oral clearance complete. SLP also provided double portion dysphagia 3 (mech soft) texture snack, during which pt demonstrated very functional mastication and oral clearance with only Min A verbal cues for use of liquids washes and smaller bites. Recommend pt upgrade to dysphagia 3 textures, continue thins, meds may be administered whole in applesauce one at a time and large should be broken in half.   SLP further facilitated session with basic money and medication management tasks from the ALFA. Pt only required extra time and Min A verbal and visual cues for problem solving for total and calculate change, however increased Mod A verbal and visual cues required for interpreting medication labels and accurately displaying how to organize a basic pill chart. Pt with good insight into level of difficulty of medication task and good verbal problem solving how to clarify how to take medications accurately (asking doctor and pharmacist). Pt left sitting in chair with alarm set and needs within reach. Continue per current plan of care.        Pain Pain Assessment Pain Scale: 0-10 Pain Score: 0-No pain  Therapy/Group: Individual Therapy  Arbutus Leas 07/15/2019, 7:08 AM

## 2019-07-15 NOTE — Patient Care Conference (Signed)
Inpatient RehabilitationTeam Conference and Plan of Care Update Date: 07/15/2019   Time: 11:45 AM    Patient Name: Russell Russell      Medical Record Number: [id number]  Date of Birth: 02-07-1951 Sex: Male         Room/Bed: 4M01C/4M01C-01 Payor Info: Payor: MEDICARE / Plan: MEDICARE PART A AND B / Product Type: *No Product type* /    Admit Date/Time:  07/07/2019  3:39 PM  Primary Diagnosis:  Cerebrovascular accident (CVA) of right basal ganglia [facility])  Patient Active Problem List   Diagnosis Date Noted  . Labile blood pressure   . Leukopenia   . Benign essential HTN   . Hypoalbuminemia   . Cerebrovascular accident (CVA) of right basal ganglia ([place]) 07/07/2019  . Acute ischemic stroke ([place])   . Alcohol abuse   . Dyslipidemia   . Dysphagia, post-stroke   . Polycythemia   . Stroke ([place]) 06/30/2019  . Prediabetes 12/23/2016  . Elevated LFTs 12/23/2016  . Pure hypercholesterolemia 12/21/2016  . Alcoholism ([place]) 12/21/2016  . Eczema 09/18/2013  . Essential hypertension 09/18/2013  . Gastroesophageal reflux disease without esophagitis 09/18/2013  . Tobacco abuse 09/18/2013    Expected Discharge Date: Expected Discharge Date: 08/05/19  Team Members Present: Physician leading conference: Dr. Delice Lesch Care Coodinator Present: Karene Fry, RN, MSN;Christina Sampson Goon, BSW;Deborah Tobin Chad, RN, BSN, [place] Nurse Present: Dorthula Nettles, RN PT Present: Becky Sax, PT OT Present: [name], OT SLP Present: Weston Anna, SLP PPS Coordinator present : Ileana Ladd, [name], SLP     Current Status/Progress Goal Weekly Team Focus  Bowel/Bladder   continent of b/b with episodes of incontineces at time; LBM: 05/02, prn laxatives  offer toileting while awake  assist with toileting needs prn   Swallow/Nutrition/ Hydration   Dysphagia 3, thin, Min A (upgraded solid 5/5), full supervision  Supervision  upgraded solid trials, independence with strategies   ADL's   mod  assist bathing and dressing, Mod assist squat pivot transfers, sit > stand min-mod assist, pushing to Lt still persists with dynamic activity  Min assist overall  ADL retraining, transfers, trunk control in sitting and standing, LUE NMR   Mobility   bed mobility CGA, transfers mod A, sit<>stand min A, ambulation 68ft +2/+3 assist in LiteGait  min A  functional mobility/transfers, LE strength, NMR, motor control, dynamic sitting/standing balance, gait training, endurance   Communication   Min-Mod intelligibility strategies sentence level  Mod I  intentional use of increased vocal intensity, slower rate   Safety/Cognition/ Behavioral Observations  ~Min-Mod A  Supervision-Min  error awareness, recall with strategies, basic porblem solving   Pain   no c/o pain  remain pain free  assess QS and prn   Skin   eczema RLE  reamin free of new skin breakdown/infection  assess skin QS and prn    Rehab Goals Patient on target to meet rehab goals: Yes Rehab Goals Revised: Patient on target with current goals *See Care Plan and progress notes for long and short-term goals.     Barriers to Discharge  Current Status/Progress Possible Resolutions Date Resolved   Nursing                  PT  Other (comments)  L hemiparesis/pushing              OT                  SLP  SW     on target with goals          Discharge Planning/Teaching Needs:  Patient and spouse goal is to discharge home  Will schedule education if reccommended   Team Discussion: MD WBC down, monitoring labs, BP up/down, low dose med started.  RN BM yesterday, cont/inc at night.  OT mod A pivot transfers, mod/max B/D, confused yesterday, yelling, min A goals.  PT CGA bed, min A amb 30', min A goals.  SLP upgraded to D3 today, mod A cognition, min/mod speech.   Revisions to Treatment Plan: SLP upgraded diet to D3 today.     Medical Summary Current Status: Left side weakness secondary to multiple scattered anterior  and posterior right brain infarcts Weekly Focus/Goal: Improve mobility, BP, leukopenia, swallowing  Barriers to Discharge: Decreased family/caregiver support;Medical stability;Incontinence   Possible Resolutions to Barriers: Therapies, follow labs, continue to advance diet as tolerated, optimize BP meds   Continued Need for Acute Rehabilitation Level of Care: The patient requires daily medical management by a physician with specialized training in physical medicine and rehabilitation for the following reasons: Direction of a multidisciplinary physical rehabilitation program to maximize functional independence : Yes Medical management of patient stability for increased activity during participation in an intensive rehabilitation regime.: Yes Analysis of laboratory values and/or radiology reports with any subsequent need for medication adjustment and/or medical intervention. : Yes   I attest that I was present, lead the team conference, and concur with the assessment and plan of the team.   Retta Diones 07/15/2019, 3:43 PM   Team conference was held via web/ teleconference due to [place] - 19

## 2019-07-15 NOTE — Progress Notes (Signed)
[place] PHYSICAL MEDICINE & REHABILITATION PROGRESS NOTE  Subjective/Complaints: Patient seen sitting up in his chair this morning.  He states he slept well overnight.  He denies complaints.  ROS: Denies CP, SOB, N/V/D  Objective: Vital Signs: Blood pressure (!) 138/113, pulse 81, temperature 98.5 F (36.9 C), resp. rate 18, weight 58.3 kg, SpO2 97 %. No results found. Recent Labs    07/13/19 0515  WBC 3.4*  HGB 16.9  HCT 49.1  PLT 234   Recent Labs    07/13/19 0515  NA 139  K 4.2  CL 107  CO2 25  GLUCOSE 102*  BUN 14  CREATININE 1.05  CALCIUM 9.4    Physical Exam: BP (!) 138/113 (BP Location: Left Arm)   Pulse 81   Temp 98.5 F (36.9 C)   Resp 18   Wt 58.3 kg   SpO2 97%   BMI 18.44 kg/m  Constitutional: No distress . Vital signs reviewed. HENT: Normocephalic.  Atraumatic.  + Poor dentition. Eyes: EOMI. No discharge. Cardiovascular: No JVD. Respiratory: Normal effort.  No stridor. GI: Non-distended. Skin: Warm and dry.  Intact. Psych: Normal mood.  Normal behavior. Musc: No edema in extremities.  No tenderness in extremities. Neuro: Alert. Motor: Left upper extremity: Shoulder abduction 4/5, distally 4/5 Left lower extremity: Hip flexion, knee extension 2+/5, ankle dorsiflexion 4/5 Dysarthria stable Left facial weakness, unchanged  Assessment/Plan: 1. Functional deficits secondary to multiple anterior and posterior right brain infarcts which require 3+ hours per day of interdisciplinary therapy in a comprehensive inpatient rehab setting.  Physiatrist is providing close team supervision and 24 hour management of active medical problems listed below.  Physiatrist and rehab team continue to assess barriers to discharge/monitor patient progress toward functional and medical goals  Care Tool:  Bathing    Body parts bathed by patient: Right arm, Left arm, Chest, Abdomen, Right upper leg, Left upper leg, Face, Front perineal area   Body parts bathed  by helper: Buttocks, Right lower leg, Left lower leg     Bathing assist Assist Level: Moderate Assistance - Patient 50 - 74%     Upper Body Dressing/Undressing Upper body dressing   What is the patient wearing?: Pull over shirt    Upper body assist Assist Level: Contact Guard/Touching assist    Lower Body Dressing/Undressing Lower body dressing      What is the patient wearing?: Pants     Lower body assist Assist for lower body dressing: Minimal Assistance - Patient > 75%     Toileting Toileting Toileting Activity did not occur (Clothing management and hygiene only): N/A (no void or bm)  Toileting assist Assist for toileting: 2 Helpers     Transfers Chair/bed transfer  Transfers assist     Chair/bed transfer assist level: Moderate Assistance - Patient 50 - 74%     Locomotion Ambulation   Ambulation assist   Ambulation activity did not occur: Safety/medical concerns(L hemi, decreased postural control/balance, fatigue)  Assist level: 2 helpers Assistive device: Lite Gait Max distance: 61ft   Walk 10 feet activity   Assist  Walk 10 feet activity did not occur: Safety/medical concerns(L hemi, decreased postural control/balance, fatigue)  Assist level: 2 helpers Assistive device: Lite Gait   Walk 50 feet activity   Assist Walk 50 feet with 2 turns activity did not occur: Safety/medical concerns(L hemi, decreased postural control/balance, fatigue)         Walk 150 feet activity   Assist Walk 150 feet activity did not occur:  Safety/medical concerns(L hemi, decreased postural control/balance, fatigue)         Walk 10 feet on uneven surface  activity   Assist Walk 10 feet on uneven surfaces activity did not occur: Safety/medical concerns(L hemi, decreased postural control/balance, fatigue)         Wheelchair     Assist Will patient use wheelchair at discharge?: Yes Type of Wheelchair: Manual    Wheelchair assist level: Dependent -  Patient 0%      Wheelchair 50 feet with 2 turns activity    Assist        Assist Level: Dependent - Patient 0%   Wheelchair 150 feet activity     Assist     Assist Level: Dependent - Patient 0%      Medical Problem List and Plan: 1.  Left side weakness secondary to multiple scattered anterior and posterior right brain infarcts.  Status post loop recorder  Continue CIR  Team conference today to discuss current and goals and coordination of care, home and environmental barriers, and discharge planning with nursing, case manager, and therapies.  2.  Antithrombotics: -DVT/anticoagulation: Lovenox             -antiplatelet therapy: Aspirin 325 mg daily and Plavix 75 mg daily x3 months then aspirin alone 3. Pain Management: Tylenol as needed 4. Mood: Provide emotional support             -antipsychotic agents: N/A 5. Neuropsych: This patient is?  Fully capable of making decisions on his own behalf. 6. Skin/Wound Care: Routine skin checks 7. Fluids/Electrolytes/Nutrition: Routine in and outs.  BMP within acceptable range on 5/3 8.  Hypertension.  Lopressor 12.5 mg twice daily.    Labile diastolic pressures on 5/5, elevated predominantly at night  Lisinopril 2.5 qhs started on 5/5  Monitor with increased mobility 9.  Hyperlipidemia.    Lipitor 10.  Dysphagia.  Dysphagia #2 thins liquids.  Follow-up speech therapy  Advance diet as tolerated 11.  History of tobacco and alcohol use.  NicoDerm patch.  Provide counseling.    Monitor for withdrawal 12.  Moderate hypoalbuminemia  Supplement initiated on 4/28 13. Leukopenia  WBCs 3.4 on 5/3  Cont to monitor  LOS: 8 days A FACE TO FACE EVALUATION WAS PERFORMED  Kepler Mccabe Lorie Phenix 07/15/2019, 8:18 AM

## 2019-07-15 NOTE — Progress Notes (Signed)
Physical Therapy Session Note  Patient Details  Name: Russell Russell MRN: [id number] Date of Birth: Jul 22, 1950  Today's Date: 07/15/2019 PT Individual Time: 1000-1110 PT Individual Time Calculation (min): 70 min   Short Term Goals: Week 2:  PT Short Term Goal 1 (Week 2): pt will transfer bed<>chair min A PT Short Term Goal 2 (Week 2): pt will transfer sit<>stand CGA PT Short Term Goal 3 (Week 2): Pt will ambulate 25ft with LRAD max A of 1  Skilled Therapeutic Interventions/Progress Updates:   Pt received in TIS and agreeable to therapy, denies pain. Total assist w/c transport to/from therapy gym for time management. Squat pivot to/from mat w/ mod assist. Sit>stand from mat w/ mod assist, max assist to maintain static stance 2/2 heavy L lean. Mirror for visual feedback only minimally helped. Added 2" step under L foot to bias R WB w/ mild improvements, needed only mod assist to maintain. Transitioned to sit<>stands at high/low table on pt's R side. Sit>stand w/ min assist, LLE on 2" step again to bias R WB. Visual cues w/ mirror to bring hip to table on his R and to keep RUE in open hand on table to prevent pushing. Performed blocked practice, 10+ reps w/ min assist for static balance x5-10 sec each. Removed step so BLE on even surface, performed 10+ reps w/ mod fading to min assist. Visual cues w/ mirror and tactile/verbal cues to bring R hip to table. With both techniques, had pt remove RUE support intermittently for further challenge, pt able to maintain RLE WB and prevent lateral lean w/ this ~50% of the time w/o manual assist. Min tactile/manual cues w/ all standing for L quad activation to maintain neutral knee extension. After intervention, pt went from max assist w/o UE support in static stance to prevent L LOB w/ L lateral lean to CGA for static stance w/o L lean and w/o UE support for 2-3 sec at a time. Pt very encouraged by this. Performed NuStep 5 min x2 @ level 4 w/ all extremities to work  on global endurance/strengthening and reciprocal movement pattern/limb dissociation. Tactile and verbal cues throughout to attend to [place]. Returned to room and ended session in TIS, all needs in reach.    Therapy Documentation Precautions:  Precautions Precautions: Fall, Other (comment) Precaution Comments: permissive HTN initially (<220/120) then normotension, L sided weakness, aspiration precautions (dysphagia 2 with thins), ; L hemiparesis Restrictions Weight Bearing Restrictions: No Vital Signs:   Therapy/Group: Individual Therapy  Elray Dains Clent Demark 07/15/2019, 11:46 AM

## 2019-07-15 NOTE — Progress Notes (Signed)
Occupational Therapy Session Note  Patient Details  Name: Russell Russell MRN: [id number] Date of Birth: [birth year]  Today's Date: 07/15/2019 OT Individual Time: 0701-0727 OT Individual Time Calculation (min): 26 min    Short Term Goals: Week 1:  OT Short Term Goal 1 (Week 1): Pt will transfer to drop arm BSC with mod A +2 with LRAD/ method OT Short Term Goal 2 (Week 1): Pt will perform sit to stand with max cues with mod A in prep for clothing management OT Short Term Goal 3 (Week 1): Pt will be able to maintain static sitting balance in prep for ADL tasks with consistant mod A OT Short Term Goal 4 (Week 1): Pt will incorporate left UE in functional tasks with minimal cuing  Skilled Therapeutic Interventions/Progress Updates:    1:1. Pt received in bed requesting to get dressed over eating breakfast. Pt supine>sitting EOB with CGA for trunk elevationa nd VC for scooting to EOB after checking that brief was clean. Pt completes UB dressing sitting EOB with no pushing noted but VC for threading LUE into correct hole and VC for gross grasp/release exaggeration d/t not attending to whether RUE fully grasps onto clothing items. MOD A provided for HOH A of RUE to thread LUE into pants. Pt stands with MOD A d/t L pushing and advances pants past hips. MIN A +2 transfer to w/c via stand pivot. Exited session with pt seate din TIS, exit alarm ona dn call light in reach  Therapy Documentation Precautions:  Precautions Precautions: Fall, Other (comment) Precaution Comments: permissive HTN initially (<220/120) then normotension, L sided weakness, aspiration precautions (dysphagia 2 with thins), ; L hemiparesis Restrictions Weight Bearing Restrictions: No General:   Vital Signs: Therapy Vitals Temp: 98.5 F (36.9 C) Pulse Rate: 81 Resp: 18 BP: (!) 138/113 Patient Position (if appropriate): Lying Oxygen Therapy SpO2: 97 % O2 Device: Room Air Pain:   ADL: ADL Upper Body Bathing: Minimal  assistance Where Assessed-Upper Body Bathing: Sitting at sink Lower Body Bathing: Maximal assistance Where Assessed-Lower Body Bathing: Sitting at sink Upper Body Dressing: Maximal assistance Where Assessed-Upper Body Dressing: Sitting at sink Lower Body Dressing: Maximal assistance Where Assessed-Lower Body Dressing: Sitting at sink, Standing at sink Toileting: Not assessed Toilet Transfer: Not assessed Vision   Perception    Praxis   Exercises:   Other Treatments:     Therapy/Group: Individual Therapy  Tonny Branch 07/15/2019, 7:28 AM

## 2019-07-15 NOTE — Progress Notes (Signed)
Social Work Patient ID: Russell Russell, male   DOB: Aug 09, 1950, 69 y.o.   MRN: [id number] Patient currently with therapy. Called spouse to provide team conference update. Provided her with updates from: MD, nursing and therapy. Spouse pleased. Reports patient having headaches, would like MD to address.  SW entered patients room to provide team conference update. Provided same information and discharge date of 5/25/ Patient pleased. SW will continue to follow up with questions or concerns.

## 2019-07-16 ENCOUNTER — Inpatient Hospital Stay (HOSPITAL_COMMUNITY): Payer: BC Managed Care – PPO

## 2019-07-16 ENCOUNTER — Inpatient Hospital Stay (HOSPITAL_COMMUNITY): Payer: BC Managed Care – PPO | Admitting: Speech Pathology

## 2019-07-16 ENCOUNTER — Ambulatory Visit: Payer: BC Managed Care – PPO

## 2019-07-16 ENCOUNTER — Inpatient Hospital Stay (HOSPITAL_COMMUNITY): Payer: BC Managed Care – PPO | Admitting: Occupational Therapy

## 2019-07-16 DIAGNOSIS — R030 Elevated blood-pressure reading, without diagnosis of hypertension: Secondary | ICD-10-CM

## 2019-07-16 NOTE — Progress Notes (Signed)
Speech Language Pathology Daily Session Note  Patient Details  Name: Russell Russell MRN: [id number] Date of Birth: [birth year]  Today's Date: 07/16/2019 SLP Individual Time: [id number] SLP Individual Time Calculation (min): 59 min  Short Term Goals: Week 2: SLP Short Term Goal 1 (Week 2): Patient will utilize speech intelligibility strategies with Min verbal cues to achieve ~90% intelligibility at the sentence level. SLP Short Term Goal 2 (Week 2): Pt will demonstrate efficient mastication and oral clearance of upgraded dysphagia 3 texture diet with Supervision A verbal cues for use of swallow strategies for oral clarance and to minimize anterior loss SLP Short Term Goal 3 (Week 2): Patient will name 2 physical and 2 cognitive changes with Mod verbal cues. SLP Short Term Goal 4 (Week 2): Patient will demonstrate functional problem solving for basic and familiar tasks with Min verbal cues. SLP Short Term Goal 5 (Week 2): Patient will utilize external aids for recall of functional information with Mod verbal and visual cues.  Skilled Therapeutic Interventions: Pt was seen for skilled ST targeting cognition. Functional conversation utilized to target short term recall regarding current medications. Pt initially only recalled he was taking a blood pressure medicine. With use chunking and association strategies, pt eventually able to recall number and general functions of current medications in a 5 and 10 minute delays with Min A verbal cues for use of strategies. Pt also completed a basic 3-step action card sequencing task with overall Min A for problem solving. Increased Moderate verbal and visual cues required for functional problem solving use of his remote for TV (initially tried to use his cell phone to turn TV on). Min A verbal cues provided for use of daily schedule as aid to anticipate the rest of his therapy appointments. Pt also performed 2 sets of 10 and 1 set of 5 EMST exercises with device set  to 15 cm H2O resistance with overall Mod faded to Min A verbal cues for accuracy. Pt left sitting in wheelchair with alarm set and needs within reach. Continue per current plan of care.        Pain Pain Assessment Pain Scale: 0-10 Pain Score: 0-No pain  Therapy/Group: Individual Therapy  Arbutus Leas 07/16/2019, 7:26 AM

## 2019-07-16 NOTE — Progress Notes (Signed)
Physical Therapy Session Note  Patient Details  Name: Russell Russell MRN: [id number] Date of Birth: [birth year]  Today's Date: 07/16/2019 PT Individual Time: 1000-1057 PT Individual Time Calculation (min): 57 min   Short Term Goals: Week 1:  PT Short Term Goal 1 (Week 1): pt will transfer supine<>sitting EOB with mod A PT Short Term Goal 1 - Progress (Week 1): Met PT Short Term Goal 2 (Week 1): Pt will transfer sit<>stand with LRAD max A PT Short Term Goal 2 - Progress (Week 1): Met PT Short Term Goal 3 (Week 1): Pt will transfer bed<>chair with LRAD max A PT Short Term Goal 3 - Progress (Week 1): Met Week 2:  PT Short Term Goal 1 (Week 2): pt will transfer bed<>chair min A PT Short Term Goal 2 (Week 2): pt will transfer sit<>stand CGA PT Short Term Goal 3 (Week 2): Pt will ambulate 54f with LRAD max A of 1  Skilled Therapeutic Interventions/Progress Updates:   Received pt sitting in TIS WC, pt agreeable to therapy, and denied any pain during session. Session focused on functional mobility/transfers, LE strength, dynamic standing balance/coordination, NMR, motor control, midline orientation and alignment, and improved activity tolerance. [facility] Pt transferred stand<>pivot TIS WC<>mat min A. Pt transferred sit<>stand min A x5 trials and worked on dynamic standing balance and midline orientation using mirror for visual feedback. Worked on pre-gait stepping 3x6 reps with R LE mod A with emphasis on L knee extension, weight shifting, midline orientation, trunk and pelvic alignment, and upright posture. Pt required verbal and tactile cues for L knee extension, weight shifting, and trunk alignment. Worked on mini-squats 3x5 reps mod A with verbal cues for weight shifting to R, L knee extension, upright posture, and to squeeze glutes. Pt required multiple rest breaks throughout session due to increased fatigue and frequently spontaneously sitting down due to  fatigue. Pt transferred sit<>stand with 3in step under R LE to facilitate L LE strength x4 reps mod A. Pt required verbal cues for upright posture and anterior weight shifiting. Pt performed additional x5 reps with step under L LE to prevent pushing to L all with mod A. Pt transferred mat<>TIS WC min A and sit<>stand<>tall kneeling with mod A +2 for safety. In tall kneeling worked on dynamic reaching for cones with L UE and crossing midline to hand them to therapy tech on R using mirror for visual feedback. Pt required verbal and tactile cues for hip extension, weight shifting to R, and upright trunk control using mirror for visual feedback. Pt transferred tall kneeling<>R side sitting<>short sitting on mat with mod A +2. Pt transferred sit<>stand x 3 additional trials and worked on pre-gait stepping with R LE. Pt with improved posture and midline orientation with mirror but with continued difficulty stepping R LE backwards. Pt transferred mat<>TIS WC min A and transported back to room total assist. Concluded session with pt semi-reclined in TIS WC, needs within reach, and seatbelt alarm on.   Therapy Documentation Precautions:  Precautions Precautions: Fall, Other (comment) Precaution Comments: permissive HTN initially (<220/120) then normotension, L sided weakness, aspiration precautions (dysphagia 2 with thins), ; L hemiparesis Restrictions Weight Bearing Restrictions: No  Therapy/Group: Individual Therapy AAlfonse AlpersPT, DPT   07/16/2019, 7:32 AM

## 2019-07-16 NOTE — Progress Notes (Signed)
[facility] [place] [place] PHYSICAL MEDICINE & REHABILITATION PROGRESS NOTE  Subjective/Complaints: Patient seen sitting up in bed this AM.  He states he slept well overnight.  Therapy notes reviewed, requiring up to max assist for standing balance.   ROS: Denies CP, SOB, N/V/D  Objective: Vital Signs: Blood pressure (!) 140/106, pulse 79, temperature 98.3 F (36.8 C), resp. rate 17, height 5\' 10"  (1.778 m), weight 58.3 kg, SpO2 100 %. No results found. No results for input(s): WBC, HGB, HCT, PLT in the last 72 hours. No results for input(s): NA, K, CL, CO2, GLUCOSE, BUN, CREATININE, CALCIUM in the last 72 hours.  Physical Exam: BP (!) 140/106   Pulse 79   Temp 98.3 F (36.8 C)   Resp 17   Ht 5\' 10"  (1.778 m)   Wt 58.3 kg   SpO2 100%   BMI 18.44 kg/m  Constitutional: No distress . Vital signs reviewed. HENT: Normocephalic.  Atraumatic.  + Poor dentition. Eyes: EOMI. No discharge. Cardiovascular: No JVD. Respiratory: Normal effort.  No stridor. GI: Non-distended. Skin: Warm and dry.  Intact. Psych: Normal mood.  Normal behavior. Musc: No edema in extremities.  No tenderness in extremities. Neuro: Alert Motor: Left upper extremity: Shoulder abduction 4/5, distally 4/5, improving Left lower extremity: Hip flexion 3/5, knee extension 2+/5, ankle dorsiflexion 4/5 Dysarthria, unchanged Left facial weakness, unchanged  Assessment/Plan: 1. Functional deficits secondary to multiple anterior and posterior right brain infarcts which require 3+ hours per day of interdisciplinary therapy in a comprehensive inpatient rehab setting.  Physiatrist is providing close team supervision and 24 hour management of active medical problems listed below.  Physiatrist and rehab team continue to assess barriers to discharge/monitor patient progress toward functional and medical goals  Care Tool:  Bathing    Body parts bathed by patient: Right arm, Left arm, Chest, Abdomen, Right upper leg, Left upper leg,  Face, Front perineal area   Body parts bathed by helper: Buttocks, Right lower leg, Left lower leg     Bathing assist Assist Level: Moderate Assistance - Patient 50 - 74%     Upper Body Dressing/Undressing Upper body dressing   What is the patient wearing?: Pull over shirt    Upper body assist Assist Level: Contact Guard/Touching assist    Lower Body Dressing/Undressing Lower body dressing      What is the patient wearing?: Pants     Lower body assist Assist for lower body dressing: Minimal Assistance - Patient > 75%     Toileting Toileting Toileting Activity did not occur (Clothing management and hygiene only): N/A (no void or bm)  Toileting assist Assist for toileting: 2 Helpers     Transfers Chair/bed transfer  Transfers assist     Chair/bed transfer assist level: Moderate Assistance - Patient 50 - 74%     Locomotion Ambulation   Ambulation assist   Ambulation activity did not occur: Safety/medical concerns(L hemi, decreased postural control/balance, fatigue)  Assist level: 2 helpers Assistive device: Lite Gait Max distance: 3ft   Walk 10 feet activity   Assist  Walk 10 feet activity did not occur: Safety/medical concerns(L hemi, decreased postural control/balance, fatigue)  Assist level: 2 helpers Assistive device: Lite Gait   Walk 50 feet activity   Assist Walk 50 feet with 2 turns activity did not occur: Safety/medical concerns(L hemi, decreased postural control/balance, fatigue)         Walk 150 feet activity   Assist Walk 150 feet activity did not occur: Safety/medical concerns(L hemi, decreased postural control/balance,  fatigue)         Walk 10 feet on uneven surface  activity   Assist Walk 10 feet on uneven surfaces activity did not occur: Safety/medical concerns(L hemi, decreased postural control/balance, fatigue)         Wheelchair     Assist Will patient use wheelchair at discharge?: Yes Type of Wheelchair:  Manual    Wheelchair assist level: Dependent - Patient 0%      Wheelchair 50 feet with 2 turns activity    Assist        Assist Level: Dependent - Patient 0%   Wheelchair 150 feet activity     Assist     Assist Level: Dependent - Patient 0%      Medical Problem List and Plan: 1.  Left side weakness secondary to multiple scattered anterior and posterior right brain infarcts.  Status post loop recorder  Continue CIR 2.  Antithrombotics: -DVT/anticoagulation: Lovenox             -antiplatelet therapy: Aspirin 325 mg daily and Plavix 75 mg daily x3 months then aspirin alone 3. Pain Management: Tylenol as needed 4. Mood: Provide emotional support             -antipsychotic agents: N/A 5. Neuropsych: This patient is?  Fully capable of making decisions on his own behalf. 6. Skin/Wound Care: Routine skin checks 7. Fluids/Electrolytes/Nutrition: Routine in and outs.  BMP within acceptable range on 5/3 8.  Hypertension.  Lopressor 12.5 mg twice daily.    Lisinopril 2.5 qhs started on 5/5  Diastolic blood pressure remains elevated on 5/6  Monitor with increased mobility 9.  Hyperlipidemia.    Lipitor 10.  Dysphagia.  Dysphagia #3 thins liquids.  Follow-up speech therapy  Cont to advance diet as tolerated 11.  History of tobacco and alcohol use.  NicoDerm patch.  Provide counseling.    Monitor for withdrawal 12.  Moderate hypoalbuminemia  Supplement initiated on 4/28 13. Leukopenia  WBCs 3.4 on 5/3, labs ordered for tomorrow  Cont to monitor  LOS: 9 days A FACE TO FACE EVALUATION WAS PERFORMED  Russell Russell 07/16/2019, 8:07 AM

## 2019-07-16 NOTE — Progress Notes (Signed)
Occupational Therapy Session Note  Patient Details  Name: Russell Russell MRN: [id number] Date of Birth: 30-Oct-1950  Today's Date: 07/16/2019 OT Individual Time: 7841-2820 OT Individual Time Calculation (min): 43 min   OT Individual Time: 1445-1530 OT Individual Time Calculation (min): 45 min    Short Term Goals: Week 2:  OT Short Term Goal 1 (Week 2): Pt will complete bathing at sit > stand with mod assist OT Short Term Goal 2 (Week 2): Pt will don pants with mod assist sit > stand OT Short Term Goal 3 (Week 2): Pt will complete toilet transfers mod assist 3 out of 5 times to demonstate consistency  Skilled Therapeutic Interventions/Progress Updates:  Session 1: Patient met lying supine in bed in agreement with OT treatment session with focus on self-care re-education and functional transfers as detailed below. Supine to EOB with CGA. Patient engaged in bathing task in unsupported sitting at EOB with Mod A and incorporation of affected LUE with cueing. Sit to stand from EOB to RW with cues for hand placement, orientation to midline, and foot placement with OT providing assistance to wash buttocks. Patient able to don UB clothing with CGA, and LB clothing with Min A and cues for hemi-dressing technique. Patient able to attain and maintain figure-4 position but required CGA to maintain dynamic sitting balance at EOB with left lateral and posterior LOB x2. Patient required Min A to thread bilateral socks with forward chaining. Oral hygiene seated at sink level with set-up assist and cueing for use of affected LUE. Session concluded with patient seated in TIS wc with call bell within reach, belt alarm activated and all needs met.   Session 2: Afternoon session with focus on NMR as detailed below. Total A for wc transport to dayroom. Sit to stand from [place] with Min A with patient requiring assistance for positioning in quadruped with BUE supported on blue bench. NMR with focus on weightbearing in  affected LUE and LLE with patient demonstrating L pusher syndrome. Multimodal cues for orientation to midline. Patient quickly fatigue requiring rest break after ~3-4 minutes in quadruped. Attempted second trial but patient unable to come to kneeling position secondary to fatigue. Patient engaged in reaching task with placement of card on mirror. Patient able to initiate shoulder flexion up to ~80-85 degrees before requiring assistance. Patient also required multimodal cues and light hand over hand assist for wrist extension, wrist supination and extension of the digits to grasp/release cards. Return to [place] wc via squat-pivot transfer with Min-Mod A to L. Session concluded with patient seated in TIS wc with call bell within reach, belt alarm activated and all needs met.  Therapy Documentation Precautions:  Precautions Precautions: Fall, Other (comment) Precaution Comments: permissive HTN initially (<220/120) then normotension, L sided weakness, aspiration precautions (dysphagia 2 with thins), ; L hemiparesis Restrictions Weight Bearing Restrictions: No  Therapy/Group: Individual Therapy  [name] 07/16/2019, 7:42 AM

## 2019-07-17 ENCOUNTER — Inpatient Hospital Stay (HOSPITAL_COMMUNITY): Payer: BC Managed Care – PPO | Admitting: Occupational Therapy

## 2019-07-17 ENCOUNTER — Inpatient Hospital Stay (HOSPITAL_COMMUNITY): Payer: BC Managed Care – PPO

## 2019-07-17 ENCOUNTER — Inpatient Hospital Stay (HOSPITAL_COMMUNITY): Payer: BC Managed Care – PPO | Admitting: Speech Pathology

## 2019-07-17 LAB — CBC WITH DIFFERENTIAL/PLATELET
Abs Immature Granulocytes: 0.01 10*3/uL (ref 0.00–0.07)
Basophils Absolute: 0 10*3/uL (ref 0.0–0.1)
Basophils Relative: 1 %
Eosinophils Absolute: 0.2 10*3/uL (ref 0.0–0.5)
Eosinophils Relative: 6 %
HCT: 47.4 % (ref 39.0–52.0)
Hemoglobin: 16 g/dL (ref 13.0–17.0)
Immature Granulocytes: 0 %
Lymphocytes Relative: 32 %
Lymphs Abs: 1.1 10*3/uL (ref 0.7–4.0)
MCH: 31.3 pg (ref 26.0–34.0)
MCHC: 33.8 g/dL (ref 30.0–36.0)
MCV: 92.6 fL (ref 80.0–100.0)
Monocytes Absolute: 0.6 10*3/uL (ref 0.1–1.0)
Monocytes Relative: 16 %
Neutro Abs: 1.6 10*3/uL — ABNORMAL LOW (ref 1.7–7.7)
Neutrophils Relative %: 45 %
Platelets: 230 10*3/uL (ref 150–400)
RBC: 5.12 MIL/uL (ref 4.22–5.81)
RDW: 15.6 % — ABNORMAL HIGH (ref 11.5–15.5)
WBC: 3.5 10*3/uL — ABNORMAL LOW (ref 4.0–10.5)
nRBC: 0 % (ref 0.0–0.2)

## 2019-07-17 NOTE — Plan of Care (Signed)
  Problem: Consults Goal: RH STROKE PATIENT EDUCATION Description: See Patient Education module for education specifics  Outcome: Progressing Goal: Nutrition Consult-if indicated Outcome: Progressing   Problem: RH SKIN INTEGRITY Goal: RH STG SKIN FREE OF INFECTION/BREAKDOWN Description: Skin free of breakdown and infection with Mod assist Outcome: Progressing Goal: RH STG MAINTAIN SKIN INTEGRITY WITH ASSISTANCE Description: STG Maintain Skin Integrity With Assistance. MOD Outcome: Progressing   Problem: RH SAFETY Goal: RH STG ADHERE TO SAFETY PRECAUTIONS W/ASSISTANCE/DEVICE Description: STG Adhere to Safety Precautions With Assistance/Device. Mod Outcome: Progressing Goal: RH STG DECREASED RISK OF FALL WITH ASSISTANCE Description: STG Decreased Risk of Fall With Assistance. Mod assist Outcome: Progressing   Problem: RH KNOWLEDGE DEFICIT Goal: RH STG INCREASE KNOWLEDGE OF HYPERTENSION Description: Patient/family will be able to describe management of hypertension including medication, diet, and lifestyle interventions with cues/handouts Outcome: Progressing Goal: RH STG INCREASE KNOWLEDGE OF DYSPHAGIA/FLUID INTAKE Description: Patient/Family will be able to describe and demonstrate safe swallowing techniques and identify foods in D2 diet scope with cues/handouts Outcome: Progressing Goal: RH STG INCREASE KNOWLEGDE OF HYPERLIPIDEMIA Description: Patient/family will be able to describe management of hyperlipidemia including medication, diet, and lifestyle interventions with cues/handouts Outcome: Progressing Goal: RH STG INCREASE KNOWLEDGE OF STROKE PROPHYLAXIS Description: Patient/family will be able to describe stroke prophylaxis including medication, diet, and lifestyle interventions with cues/handouts Outcome: Progressing

## 2019-07-17 NOTE — Progress Notes (Signed)
Speech Language Pathology Daily Session Note  Patient Details  Name: Russell Russell MRN: [id number] Date of Birth: Mar 07, 1951  Today's Date: 07/17/2019 SLP Individual Time: 1005-1100 SLP Individual Time Calculation (min): 55 min  Short Term Goals: Week 2: SLP Short Term Goal 1 (Week 2): Patient will utilize speech intelligibility strategies with Min verbal cues to achieve ~90% intelligibility at the sentence level. SLP Short Term Goal 2 (Week 2): Pt will demonstrate efficient mastication and oral clearance of upgraded dysphagia 3 texture diet with Supervision A verbal cues for use of swallow strategies for oral clarance and to minimize anterior loss SLP Short Term Goal 3 (Week 2): Patient will name 2 physical and 2 cognitive changes with Mod verbal cues. SLP Short Term Goal 4 (Week 2): Patient will demonstrate functional problem solving for basic and familiar tasks with Min verbal cues. SLP Short Term Goal 5 (Week 2): Patient will utilize external aids for recall of functional information with Mod verbal and visual cues.  Skilled Therapeutic Interventions:  Pt was seen for skilled ST targeting speech and dysphagia goals.  SLP facilitated the session with a functional snack of dys 3 textures to assess toleration of recently upgraded diet.  Pt consumed solids with increased time to account for oral weakness and to allow for complete clearance of residue from the oral cavity.  Pt's vocal quality became wet when he attempted to talk while eating but no other overt s/s of aspiration were noted with solids or liquids.  Pt reports excellent toleration of diet advancement overall.  SLP also facilitated the session with a novel verbal description task to address use of speech intelligibility strategies.  SLP provided skilled education regarding compensatory strategies, emphasizing slow rate, loud voice and overarticulation and a handout of strategies was left out throughout task to facilitate carryover.  Pt  needed min assist verbal cues to use strategies to achieve intelligibility at the sentence level.  Pt was left in recliner with chair alarm set and call bell within reach.  Continue per current plan of care.    Pain Pain Assessment Pain Scale: 0-10 Pain Score: 0-No pain  Therapy/Group: Individual Therapy  Edy Belt, Selinda Orion 07/17/2019, 11:01 AM

## 2019-07-17 NOTE — Progress Notes (Signed)
Occupational Therapy Session Note  Patient Details  Name: Russell Russell MRN: [id number] Date of Birth: 1950-05-28  Today's Date: 07/17/2019 OT Individual Time: [id number] OT Individual Time Calculation (min): 70 min    Short Term Goals: Week 2:  OT Short Term Goal 1 (Week 2): Pt will complete bathing at sit > stand with mod assist OT Short Term Goal 2 (Week 2): Pt will don pants with mod assist sit > stand OT Short Term Goal 3 (Week 2): Pt will complete toilet transfers mod assist 3 out of 5 times to demonstate consistency  Skilled Therapeutic Interventions/Progress Updates:    Treatment session with focus on functional transfers, dynamic sitting and standing balance, and LUE NMR.  Pt received on bedpan, engaged in rolling with min assist and assistance for clothing management in sidelying.  Pt completed bed mobility with CGA.  Therapist donned shoes while focusing on sitting balance.  Completed squat pivot transfer to Rt min assist bed > w/c > therapy mat.  Engaged in sit > stand and sit > partial stand with pt requiring min assist for sit > full upright stand but requiring increased physical assistance when in partial stand.  Utilized proprioceptive input along pt's Lt side of body to facilitate increased weight shift to Rt to achieve midline posture.  Engaged in reaching task with LUE in sitting and standing with focus on increased functional reach and AAROM.  Utilized dowel in BUE to facilitate increased symmetry with movements, requiring intermittent support at elbow to facilitate symmetrical movements.  Pt asking questions about f/u therapy after d/c.  Returned to room and left reclined in TIS w/c with seat belt alarm on and all needs in reach.  Therapy Documentation Precautions:  Precautions Precautions: Fall, Other (comment) Precaution Comments: permissive HTN initially (<220/120) then normotension, L sided weakness, aspiration precautions (dysphagia 2 with thins), ; L  hemiparesis Restrictions Weight Bearing Restrictions: No General:   Vital Signs: Therapy Vitals Temp: 98.7 F (37.1 C) Temp Source: Oral Pulse Rate: 78 Resp: 16 BP: (!) 118/93 Patient Position (if appropriate): Sitting Oxygen Therapy SpO2: 100 % O2 Device: Room Air Pain:  Pt with no c/o pain   Therapy/Group: Individual Therapy  [name] 07/17/2019, 3:41 PM

## 2019-07-17 NOTE — Progress Notes (Signed)
Physical Therapy Session Note  Patient Details  Name: Russell Russell MRN: [id number] Date of Birth: October 30, 1950  Today's Date: 07/17/2019 PT Individual Time: 0900-0957 PT Individual Time Calculation (min): 57 min   Short Term Goals: Week 1:  PT Short Term Goal 1 (Week 1): pt will transfer supine<>sitting EOB with mod A PT Short Term Goal 1 - Progress (Week 1): Met PT Short Term Goal 2 (Week 1): Pt will transfer sit<>stand with LRAD max A PT Short Term Goal 2 - Progress (Week 1): Met PT Short Term Goal 3 (Week 1): Pt will transfer bed<>chair with LRAD max A PT Short Term Goal 3 - Progress (Week 1): Met Week 2:  PT Short Term Goal 1 (Week 2): pt will transfer bed<>chair min A PT Short Term Goal 2 (Week 2): pt will transfer sit<>stand CGA PT Short Term Goal 3 (Week 2): Pt will ambulate 39f with LRAD max A of 1  Skilled Therapeutic Interventions/Progress Updates:   Received pt supine in bed, pt agreeable to therapy, and denied any pain during session. Session focused on functional mobility/transfers, LE strength, dynamic standing balance/coordination, NMR, motor control/planning, midline orientation and pelvic alignment in standing, and improved activity tolerance. Pt donned pants in supine with min A rolling to L and R using bedrails and encouragement to use L UE. Pt transferred supine<>sitting EOB with bed flat and use of bedrails with supervision and cues for logroll technique. Pt doffed dirty shift and donned clan one with supervision while sitting EOB. Doffed dirty socks and donned clean ones and shoes total assist for time management. Pt transferred bed<>TIS WC min A. Pt transported to ortho gym in [place]. Pt performed simulated car transfer with min A and cues for hand placement and safety. Pt transferred squat<>pivot TIS WC<>mat with min A. Pt transferred sit<>stand min A and worked on dynamic reaching hooking and unhooking horseshoes from R side of mirror with emphasis on R  weight shifting, postural control, and pelvic alignment x 5 reps mod A +2. Worked on dynamic standing balance and midline orientation using mirror for visual feedback with verbal and tactile cues for weight shifting to R, trunk control and pelvic alignment, hip extension, and midline orientation. Pt transferred sit<>stand with R LE supported on step x3 reps mod A with emphasis on avoid pushing to L. However, pt with increased difficulty extending L knee and hip resulting in crouched posture in standing and inability to achieve full extension. Worked on pre-gait stepping with R LE x 2 reps. Pt with decreased foot clearance and poor weight shifting. Worked on pre-gait stepping with L LE x 8 reps with verbal and tactile cues for weight shifting to R, L LE hip flexion, and upright posture. Pt transferred mat<>TIS squat<>pivot with min A. Pt transported back to room and transferred squat<>pivot TIS WC<>recliner min A. Concluded session with pt semi-reclined in recliner, needs within reach, and seatbelt alarm on.   Therapy Documentation Precautions:  Precautions Precautions: Fall, Other (comment) Precaution Comments: permissive HTN initially (<220/120) then normotension, L sided weakness, aspiration precautions (dysphagia 2 with thins), ; L hemiparesis Restrictions Weight Bearing Restrictions: No  Therapy/Group: Individual Therapy AAlfonse AlpersPT, DPT   07/17/2019, 7:35 AM

## 2019-07-17 NOTE — Progress Notes (Signed)
[place] PHYSICAL MEDICINE & REHABILITATION PROGRESS NOTE  Subjective/Complaints: Patient seen sitting up in bed this AM.  He states he slept well overnight.  He is pleased with his upgraded diet.  ROS: Denies CP, SOB, N/V/D  Objective: Vital Signs: Blood pressure (!) 139/98, pulse 87, temperature 98.4 F (36.9 C), resp. rate 16, height 5\' 10"  (1.778 m), weight 58.3 kg, SpO2 97 %. No results found. Recent Labs    07/17/19 0620  WBC 3.5*  HGB 16.0  HCT 47.4  PLT 230   No results for input(s): NA, K, CL, CO2, GLUCOSE, BUN, CREATININE, CALCIUM in the last 72 hours.  Physical Exam: BP (!) 139/98 (BP Location: Left Arm)   Pulse 87   Temp 98.4 F (36.9 C)   Resp 16   Ht 5\' 10"  (1.778 m)   Wt 58.3 kg   SpO2 97%   BMI 18.44 kg/m  Constitutional: No distress . Vital signs reviewed. HENT: Normocephalic.  Atraumatic.  + Poor dentition. Eyes: EOMI. No discharge. Cardiovascular: No JVD. Respiratory: Normal effort.  No stridor. GI: Non-distended. Skin: Warm and dry.  Intact. Psych: Normal mood.  Normal behavior. Musc: No edema in extremities.  No tenderness in extremities. Neuro: Alert Motor: Left upper extremity: Shoulder abduction 4/5, distally 4/5, improving Left lower extremity: Hip flexion 3/5, knee extension 2+/5, ankle dorsiflexion 4/5, improving Dysarthria, stable Left facial weakness, unchanged  Assessment/Plan: 1. Functional deficits secondary to multiple anterior and posterior right brain infarcts which require 3+ hours per day of interdisciplinary therapy in a comprehensive inpatient rehab setting.  Physiatrist is providing close team supervision and 24 hour management of active medical problems listed below.  Physiatrist and rehab team continue to assess barriers to discharge/monitor patient progress toward functional and medical goals  Care Tool:  Bathing    Body parts bathed by patient: Right arm, Left arm, Chest, Abdomen, Right upper leg, Left upper  leg, Face, Front perineal area, Right lower leg, Left lower leg   Body parts bathed by helper: Buttocks     Bathing assist Assist Level: Moderate Assistance - Patient 50 - 74%     Upper Body Dressing/Undressing Upper body dressing   What is the patient wearing?: Pull over shirt    Upper body assist Assist Level: Contact Guard/Touching assist    Lower Body Dressing/Undressing Lower body dressing      What is the patient wearing?: Pants, Incontinence brief     Lower body assist Assist for lower body dressing: Minimal Assistance - Patient > 75%     Toileting Toileting Toileting Activity did not occur (Clothing management and hygiene only): N/A (no void or bm)  Toileting assist Assist for toileting: 2 Helpers     Transfers Chair/bed transfer  Transfers assist     Chair/bed transfer assist level: Minimal Assistance - Patient > 75%     Locomotion Ambulation   Ambulation assist   Ambulation activity did not occur: Safety/medical concerns(L hemi, decreased postural control/balance, fatigue)  Assist level: 2 helpers Assistive device: Lite Gait Max distance: 36ft   Walk 10 feet activity   Assist  Walk 10 feet activity did not occur: Safety/medical concerns(L hemi, decreased postural control/balance, fatigue)  Assist level: 2 helpers Assistive device: Lite Gait   Walk 50 feet activity   Assist Walk 50 feet with 2 turns activity did not occur: Safety/medical concerns(L hemi, decreased postural control/balance, fatigue)         Walk 150 feet activity   Assist Walk 150 feet activity  did not occur: Safety/medical concerns(L hemi, decreased postural control/balance, fatigue)         Walk 10 feet on uneven surface  activity   Assist Walk 10 feet on uneven surfaces activity did not occur: Safety/medical concerns(L hemi, decreased postural control/balance, fatigue)         Wheelchair     Assist Will patient use wheelchair at discharge?:  Yes Type of Wheelchair: Manual    Wheelchair assist level: Dependent - Patient 0%      Wheelchair 50 feet with 2 turns activity    Assist        Assist Level: Dependent - Patient 0%   Wheelchair 150 feet activity     Assist     Assist Level: Dependent - Patient 0%      Medical Problem List and Plan: 1.  Left side weakness secondary to multiple scattered anterior and posterior right brain infarcts.  Status post loop recorder  Continue CIR 2.  Antithrombotics: -DVT/anticoagulation: Lovenox             -antiplatelet therapy: Aspirin 325 mg daily and Plavix 75 mg daily x3 months then aspirin alone 3. Pain Management: Tylenol as needed 4. Mood: Provide emotional support             -antipsychotic agents: N/A 5. Neuropsych: This patient is?  Fully capable of making decisions on his own behalf. 6. Skin/Wound Care: Routine skin checks 7. Fluids/Electrolytes/Nutrition: Routine in and outs.  BMP within acceptable range on 5/3 8.  Hypertension.  Lopressor 12.5 mg twice daily.    Lisinopril 2.5 qhs started on 5/5  Diastolic blood pressure labile on 5/7, monitor, will be cautious to avoid overcorrection.  Monitor with increased mobility 9.  Hyperlipidemia.    Lipitor 10.  Dysphagia.  Dysphagia #3 thins liquids.  Follow-up speech therapy  Cont to advance diet as tolerated 11.  History of tobacco and alcohol use.  NicoDerm patch.  Provide counseling.    Monitor for withdrawal 12.  Moderate hypoalbuminemia  Supplement initiated on 4/28 13. Leukopenia  WBCs 8.5 on 5/7, labs ordered for Monday  Cont to monitor  LOS: 10 days A FACE TO FACE EVALUATION WAS PERFORMED  Makyia Erxleben Lorie Phenix 07/17/2019, 8:31 AM

## 2019-07-18 MED ORDER — METOPROLOL TARTRATE 25 MG PO TABS
25.0000 mg | ORAL_TABLET | Freq: Two times a day (BID) | ORAL | Status: DC
  Administered 2019-07-18 – 2019-08-05 (×36): 25 mg via ORAL
  Filled 2019-07-18 (×38): qty 1

## 2019-07-18 MED ORDER — METOPROLOL TARTRATE 12.5 MG HALF TABLET
12.5000 mg | ORAL_TABLET | Freq: Once | ORAL | Status: AC
  Administered 2019-07-18: 12.5 mg via ORAL
  Filled 2019-07-18: qty 1

## 2019-07-18 NOTE — Progress Notes (Signed)
[facility] PHYSICAL MEDICINE & REHABILITATION PROGRESS NOTE  Subjective/Complaints: No new issues. Had a good night. Resting when I entered  ROS: Patient denies fever, rash, sore throat, blurred vision, nausea, vomiting, diarrhea, cough, shortness of breath or chest pain, joint or back pain, headache, or mood change.   Objective: Vital Signs: Blood pressure (!) 129/97, pulse 75, temperature 97.6 F (36.4 C), temperature source Oral, resp. rate 16, height 5\' 10"  (1.778 m), weight 58.3 kg, SpO2 100 %. No results found. Recent Labs    07/17/19 0620  WBC 3.5*  HGB 16.0  HCT 47.4  PLT 230   No results for input(s): NA, K, CL, CO2, GLUCOSE, BUN, CREATININE, CALCIUM in the last 72 hours.  Physical Exam: BP (!) 129/97   Pulse 75   Temp 97.6 F (36.4 C) (Oral)   Resp 16   Ht 5\' 10"  (1.778 m)   Wt 58.3 kg   SpO2 100%   BMI 18.44 kg/m  Constitutional: No distress . Vital signs reviewed. HEENT: EOMI, oral membranes moist. Poor dentition Neck: supple Cardiovascular: RRR without murmur. No JVD    Respiratory/Chest: CTA Bilaterally without wheezes or rales. Normal effort    GI/Abdomen: BS +, non-tender, non-distended Ext: no clubbing, cyanosis, or edema Psych: pleasant and cooperative Musc: No edema in extremities.  No tenderness in extremities. Neuro: Alert Motor: Left upper extremity: Shoulder abduction 4/5, distally 4/5, improving Left lower extremity: Hip flexion 3/5, knee extension 2+/5, ankle dorsiflexion 4/5, stable exam 5/8 Dysarthria, stable Left facial weakness, unchanged  Assessment/Plan: 1. Functional deficits secondary to multiple anterior and posterior right brain infarcts which require 3+ hours per day of interdisciplinary therapy in a comprehensive inpatient rehab setting.  Physiatrist is providing close team supervision and 24 hour management of active medical problems listed below.  Physiatrist and rehab team continue to assess barriers to discharge/monitor  patient progress toward functional and medical goals  Care Tool:  Bathing    Body parts bathed by patient: Right arm, Left arm, Chest, Abdomen, Right upper leg, Left upper leg, Face, Front perineal area, Right lower leg, Left lower leg   Body parts bathed by helper: Buttocks     Bathing assist Assist Level: Moderate Assistance - Patient 50 - 74%     Upper Body Dressing/Undressing Upper body dressing   What is the patient wearing?: Pull over shirt    Upper body assist Assist Level: Contact Guard/Touching assist    Lower Body Dressing/Undressing Lower body dressing      What is the patient wearing?: Pants, Incontinence brief     Lower body assist Assist for lower body dressing: Minimal Assistance - Patient > 75%     Toileting Toileting Toileting Activity did not occur (Clothing management and hygiene only): N/A (no void or bm)  Toileting assist Assist for toileting: 2 Helpers     Transfers Chair/bed transfer  Transfers assist     Chair/bed transfer assist level: Minimal Assistance - Patient > 75%     Locomotion Ambulation   Ambulation assist   Ambulation activity did not occur: Safety/medical concerns(L hemi, decreased postural control/balance, fatigue)  Assist level: 2 helpers Assistive device: Lite Gait Max distance: 52ft   Walk 10 feet activity   Assist  Walk 10 feet activity did not occur: Safety/medical concerns(L hemi, decreased postural control/balance, fatigue)  Assist level: 2 helpers Assistive device: Lite Gait   Walk 50 feet activity   Assist Walk 50 feet with 2 turns activity did not occur: Safety/medical concerns(L hemi,  decreased postural control/balance, fatigue)         Walk 150 feet activity   Assist Walk 150 feet activity did not occur: Safety/medical concerns(L hemi, decreased postural control/balance, fatigue)         Walk 10 feet on uneven surface  activity   Assist Walk 10 feet on uneven surfaces activity did  not occur: Safety/medical concerns(L hemi, decreased postural control/balance, fatigue)         Wheelchair     Assist Will patient use wheelchair at discharge?: Yes Type of Wheelchair: Manual    Wheelchair assist level: Dependent - Patient 0%      Wheelchair 50 feet with 2 turns activity    Assist        Assist Level: Dependent - Patient 0%   Wheelchair 150 feet activity     Assist     Assist Level: Dependent - Patient 0%      Medical Problem List and Plan: 1.  Left side weakness secondary to multiple scattered anterior and posterior right brain infarcts.  Status post loop recorder  Continue CIR 2.  Antithrombotics: -DVT/anticoagulation: Lovenox             -antiplatelet therapy: Aspirin 325 mg daily and Plavix 75 mg daily x3 months then aspirin alone 3. Pain Management: Tylenol as needed 4. Mood: Provide emotional support             -antipsychotic agents: N/A 5. Neuropsych: This patient is?  Fully capable of making decisions on his own behalf. 6. Skin/Wound Care: Routine skin checks 7. Fluids/Electrolytes/Nutrition: Routine in and outs.  BMP within acceptable range on 5/3 8.  Hypertension.  Lopressor 12.5 mg twice daily.    Lisinopril 2.5 qhs started on 5/5  5/8 DBP remains quite high   -increase lopressor to 25mg  given tachy  Monitor with increased mobility 9.  Hyperlipidemia.    Lipitor 10.  Dysphagia.  Dysphagia #3 thins liquids.  Follow-up speech therapy  Cont to advance diet as tolerated 11.  History of tobacco and alcohol use.  NicoDerm patch.  Provide counseling.    Monitor for withdrawal 12.  Moderate hypoalbuminemia  Supplement initiated on 4/28 13. Leukopenia  WBCs 8.5 on 5/7, labs ordered for Monday  Cont to monitor  LOS: 11 days A FACE TO [place] 07/18/2019, 8:23 AM

## 2019-07-19 ENCOUNTER — Inpatient Hospital Stay (HOSPITAL_COMMUNITY): Payer: BC Managed Care – PPO

## 2019-07-19 NOTE — Progress Notes (Signed)
[facility] PHYSICAL MEDICINE & REHABILITATION PROGRESS NOTE  Subjective/Complaints: Up in bed. Just finished breakfast. No complaints  ROS: Patient denies fever, rash, sore throat, blurred vision, nausea, vomiting, diarrhea, cough, shortness of breath or chest pain, joint or back pain, headache, or mood change.   Objective: Vital Signs: Blood pressure (!) 120/94, pulse 76, temperature 98.2 F (36.8 C), resp. rate 16, height 5\' 10"  (1.778 m), weight 58.3 kg, SpO2 100 %. No results found. Recent Labs    07/17/19 0620  WBC 3.5*  HGB 16.0  HCT 47.4  PLT 230   No results for input(s): NA, K, CL, CO2, GLUCOSE, BUN, CREATININE, CALCIUM in the last 72 hours.  Physical Exam: BP (!) 120/94 (BP Location: Left Arm)   Pulse 76   Temp 98.2 F (36.8 C)   Resp 16   Ht 5\' 10"  (1.778 m)   Wt 58.3 kg   SpO2 100%   BMI 18.44 kg/m  Constitutional: No distress . Vital signs reviewed. HEENT: EOMI, oral membranes moist Neck: supple Cardiovascular: RRR without murmur. No JVD    Respiratory/Chest: CTA Bilaterally without wheezes or rales. Normal effort    GI/Abdomen: BS +, non-tender, non-distended Ext: no clubbing, cyanosis, or edema Psych: pleasant and cooperative Musc: No edema in extremities.  No tenderness in extremities. Neuro: Alert Motor: Left upper extremity: Shoulder abduction 4/5, distally 4/5, improving Left lower extremity: Hip flexion 3/5, knee extension 2+/5, ankle dorsiflexion 4/5, stable exam 5/8 Dysarthria,but very intelligible  Left facial weakness, stable.   Assessment/Plan: 1. Functional deficits secondary to multiple anterior and posterior right brain infarcts which require 3+ hours per day of interdisciplinary therapy in a comprehensive inpatient rehab setting.  Physiatrist is providing close team supervision and 24 hour management of active medical problems listed below.  Physiatrist and rehab team continue to assess barriers to discharge/monitor patient progress  toward functional and medical goals  Care Tool:  Bathing    Body parts bathed by patient: Right arm, Left arm, Chest, Abdomen, Right upper leg, Left upper leg, Face, Front perineal area, Right lower leg, Left lower leg   Body parts bathed by helper: Buttocks     Bathing assist Assist Level: Moderate Assistance - Patient 50 - 74%     Upper Body Dressing/Undressing Upper body dressing   What is the patient wearing?: Pull over shirt    Upper body assist Assist Level: Contact Guard/Touching assist    Lower Body Dressing/Undressing Lower body dressing      What is the patient wearing?: Pants, Incontinence brief     Lower body assist Assist for lower body dressing: Minimal Assistance - Patient > 75%     Toileting Toileting Toileting Activity did not occur (Clothing management and hygiene only): N/A (no void or bm)  Toileting assist Assist for toileting: 2 Helpers     Transfers Chair/bed transfer  Transfers assist     Chair/bed transfer assist level: Minimal Assistance - Patient > 75%     Locomotion Ambulation   Ambulation assist   Ambulation activity did not occur: Safety/medical concerns(L hemi, decreased postural control/balance, fatigue)  Assist level: 2 helpers Assistive device: Lite Gait Max distance: 18ft   Walk 10 feet activity   Assist  Walk 10 feet activity did not occur: Safety/medical concerns(L hemi, decreased postural control/balance, fatigue)  Assist level: 2 helpers Assistive device: Lite Gait   Walk 50 feet activity   Assist Walk 50 feet with 2 turns activity did not occur: Safety/medical concerns(L hemi, decreased postural  control/balance, fatigue)         Walk 150 feet activity   Assist Walk 150 feet activity did not occur: Safety/medical concerns(L hemi, decreased postural control/balance, fatigue)         Walk 10 feet on uneven surface  activity   Assist Walk 10 feet on uneven surfaces activity did not occur:  Safety/medical concerns(L hemi, decreased postural control/balance, fatigue)         Wheelchair     Assist Will patient use wheelchair at discharge?: Yes Type of Wheelchair: Manual    Wheelchair assist level: Dependent - Patient 0%      Wheelchair 50 feet with 2 turns activity    Assist        Assist Level: Dependent - Patient 0%   Wheelchair 150 feet activity     Assist     Assist Level: Dependent - Patient 0%      Medical Problem List and Plan: 1.  Left side weakness secondary to multiple scattered anterior and posterior right brain infarcts.  Status post loop recorder  Continue CIR PT, OT, SLP 2.  Antithrombotics: -DVT/anticoagulation: Lovenox             -antiplatelet therapy: Aspirin 325 mg daily and Plavix 75 mg daily x3 months then aspirin alone 3. Pain Management: Tylenol as needed 4. Mood: Provide emotional support             -antipsychotic agents: N/A 5. Neuropsych: This patient is?  Fully capable of making decisions on his own behalf. 6. Skin/Wound Care: Routine skin checks 7. Fluids/Electrolytes/Nutrition: Routine in and outs.  BMP within acceptable range on 5/3 8.  Hypertension.  Lopressor 12.5 mg twice daily.    Lisinopril 2.5 qhs started on 5/5  5/8 DBP remains quite high   -increase lopressor to 25mg  given tachy  5/9 BP sl improved this morning. Observe today with above change  Monitor with increased mobility 9.  Hyperlipidemia.    Lipitor 10.  Dysphagia.  Dysphagia #3 thins liquids.  Follow-up speech therapy  Cont to advance diet as tolerated 11.  History of tobacco and alcohol use.  NicoDerm patch.  Provide counseling.    Monitor for withdrawal 12.  Moderate hypoalbuminemia  Supplement initiated on 4/28 13. Leukopenia  WBCs 8.5 on 5/7, labs ordered for Monday  Cont to monitor  LOS: 12 days A FACE TO [place] 07/19/2019, 7:54 AM

## 2019-07-19 NOTE — Progress Notes (Signed)
Physical Therapy Session Note  Patient Details  Name: Russell Russell MRN: [id number] Date of Birth: 01-06-1951  Today's Date: 07/19/2019 PT Individual Time: 1401-1511 PT Individual Time Calculation (min): 70 min   Short Term Goals: Week 2:  PT Short Term Goal 1 (Week 2): pt will transfer bed<>chair min A PT Short Term Goal 2 (Week 2): pt will transfer sit<>stand CGA PT Short Term Goal 3 (Week 2): Pt will ambulate 37ft with LRAD max A of 1  Skilled Therapeutic Interventions/Progress Updates:    Pt seated in TIS w/c upon PT arrival, agreeable to therapy tx and denies pain. Pt transported to the gym in [place] w/c. Pt performed stand pivot to the mat with mod assist, cues for anterior R lean to limit pushing L. Pt performs sit<>stands from edge of mat this session without AD and min-mod assist, in standing pt noted with L lateral lean/mild pushing. Session focused on standing balance, postural control and R lateral weightshifting to correct L Lean with use of chair on patients R side as tactile target to "touch hip to chair." Pt able to successfully shift hips laterally to the R towards chair however continues to lean with trunk to L. Pt transferred to w/c with min assist and parked w/c next to wall for tactile target/visual cue for midline. Pt performed sit<>stands from w/c without UE support, therapist encouraging pt to hold hands together, with min assist. In standing with wall on R side pt able to maintain midline standing balance with min-CGA, cues to touch R shoulder and R hip to wall - mirror also for visual feedback. Able to progress to pre-gait with tactile/visual cue of wall on R side to maintain midline/encourage R lateral weightshift while stepping in place with L LE x 5 and then to perform cone kicks with L LE x 8, min-mod assist with continued cues throughout to keep R shoulder touching wall. Pt then transferred to tall kneeling on the mat with mod assist, continued use of wall on patients R  side for tactile target and visual cue for midline, in tall kneeling cues to remain midline with the wall while moving L LE out/in in place x 4. Pt incontinent of bladder this session. Assisted down from tall kneeling and to w/c with mod assist, transported back to room. In the bathroom used wall on patients R side for continued midline orientation and encouraging R lateral weightshift while maintaining standing balance, therapist assisted to change brief/clothes. Pt transported back to the gym and ambulated x 5 ft forwards/backwards using wall on R side (wall without handrail) for midline orientation, mod assist +2 with max cues. Pt performed x 10 sit<>stands next to wall on R side without UE support, min assist working on blocked practice, emphasis on maintaining midline, upon standing did provide assist/facilitation for full L hip/knee extension. Pt transported back to room, stand pivot to bed min assist and sit>supine CGA. Left supine in bed with needs in reach and bed alarm set.   Therapy Documentation Precautions:  Precautions Precautions: Fall, Other (comment) Precaution Comments: permissive HTN initially (<220/120) then normotension, L sided weakness, aspiration precautions (dysphagia 2 with thins), ; L hemiparesis Restrictions Weight Bearing Restrictions: No    Therapy/Group: Individual Therapy  Netta Corrigan, PT, DPT, CSRS 07/19/2019, 7:59 AM

## 2019-07-20 ENCOUNTER — Inpatient Hospital Stay (HOSPITAL_COMMUNITY): Payer: BC Managed Care – PPO

## 2019-07-20 ENCOUNTER — Inpatient Hospital Stay (HOSPITAL_COMMUNITY): Payer: BC Managed Care – PPO | Admitting: Occupational Therapy

## 2019-07-20 ENCOUNTER — Inpatient Hospital Stay (HOSPITAL_COMMUNITY): Payer: BC Managed Care – PPO | Admitting: Physical Therapy

## 2019-07-20 LAB — CBC WITH DIFFERENTIAL/PLATELET
Abs Immature Granulocytes: 0.05 10*3/uL (ref 0.00–0.07)
Basophils Absolute: 0 10*3/uL (ref 0.0–0.1)
Basophils Relative: 0 %
Eosinophils Absolute: 0 10*3/uL (ref 0.0–0.5)
Eosinophils Relative: 0 %
HCT: 45.6 % (ref 39.0–52.0)
Hemoglobin: 15.7 g/dL (ref 13.0–17.0)
Immature Granulocytes: 0 %
Lymphocytes Relative: 7 %
Lymphs Abs: 0.8 10*3/uL (ref 0.7–4.0)
MCH: 31.8 pg (ref 26.0–34.0)
MCHC: 34.4 g/dL (ref 30.0–36.0)
MCV: 92.3 fL (ref 80.0–100.0)
Monocytes Absolute: 0.9 10*3/uL (ref 0.1–1.0)
Monocytes Relative: 7 %
Neutro Abs: 10.7 10*3/uL — ABNORMAL HIGH (ref 1.7–7.7)
Neutrophils Relative %: 86 %
Platelets: 245 10*3/uL (ref 150–400)
RBC: 4.94 MIL/uL (ref 4.22–5.81)
RDW: 15.8 % — ABNORMAL HIGH (ref 11.5–15.5)
WBC: 12.5 10*3/uL — ABNORMAL HIGH (ref 4.0–10.5)
nRBC: 0 % (ref 0.0–0.2)

## 2019-07-20 LAB — CBC
HCT: 49 % (ref 39.0–52.0)
Hemoglobin: 16.7 g/dL (ref 13.0–17.0)
MCH: 32.3 pg (ref 26.0–34.0)
MCHC: 34.1 g/dL (ref 30.0–36.0)
MCV: 94.8 fL (ref 80.0–100.0)
Platelets: 263 10*3/uL (ref 150–400)
RBC: 5.17 MIL/uL (ref 4.22–5.81)
RDW: 15.8 % — ABNORMAL HIGH (ref 11.5–15.5)
WBC: 8.7 10*3/uL (ref 4.0–10.5)
nRBC: 0 % (ref 0.0–0.2)

## 2019-07-20 LAB — PROCALCITONIN: Procalcitonin: 0.53 ng/mL

## 2019-07-20 LAB — URINALYSIS, ROUTINE W REFLEX MICROSCOPIC
Bilirubin Urine: NEGATIVE
Glucose, UA: NEGATIVE mg/dL
Ketones, ur: 5 mg/dL — AB
Nitrite: NEGATIVE
Protein, ur: 100 mg/dL — AB
RBC / HPF: >50 RBC/hpf — ABNORMAL HIGH (ref 0–5)
Specific Gravity, Urine: 1.019 (ref 1.005–1.030)
pH: 7 (ref 5.0–8.0)

## 2019-07-20 LAB — BASIC METABOLIC PANEL
Anion gap: 9 (ref 5–15)
BUN: 27 mg/dL — ABNORMAL HIGH (ref 8–23)
CO2: 23 mmol/L (ref 22–32)
Calcium: 9.9 mg/dL (ref 8.9–10.3)
Chloride: 105 mmol/L (ref 98–111)
Creatinine, Ser: 1.18 mg/dL (ref 0.61–1.24)
GFR calc Af Amer: >60 mL/min (ref >60)
GFR calc non Af Amer: >60 mL/min (ref >60)
Glucose, Bld: 132 mg/dL — ABNORMAL HIGH (ref 70–99)
Potassium: 4.7 mmol/L (ref 3.5–5.1)
Sodium: 137 mmol/L (ref 135–145)

## 2019-07-20 IMAGING — DX DG CHEST 2V
3 series · 3 of 3 positions shown · non-contrast
Comparison: None.

CLINICAL DATA: Fevers

EXAM:
CHEST - 2 VIEW

[w chest lat (1 of 2)]
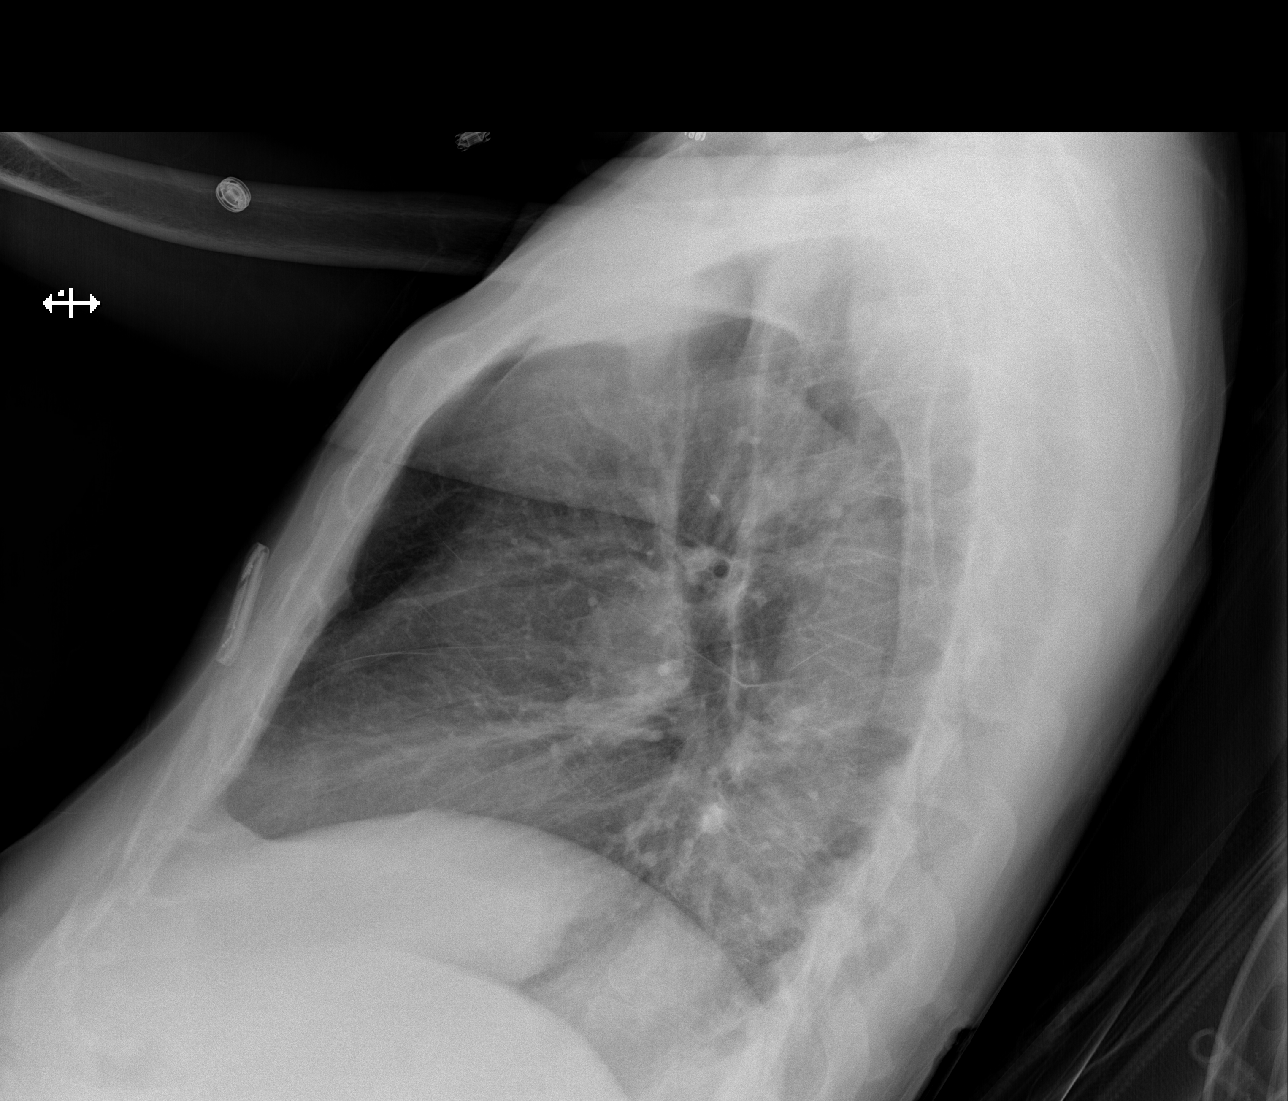

[w chest lat (2 of 2)]
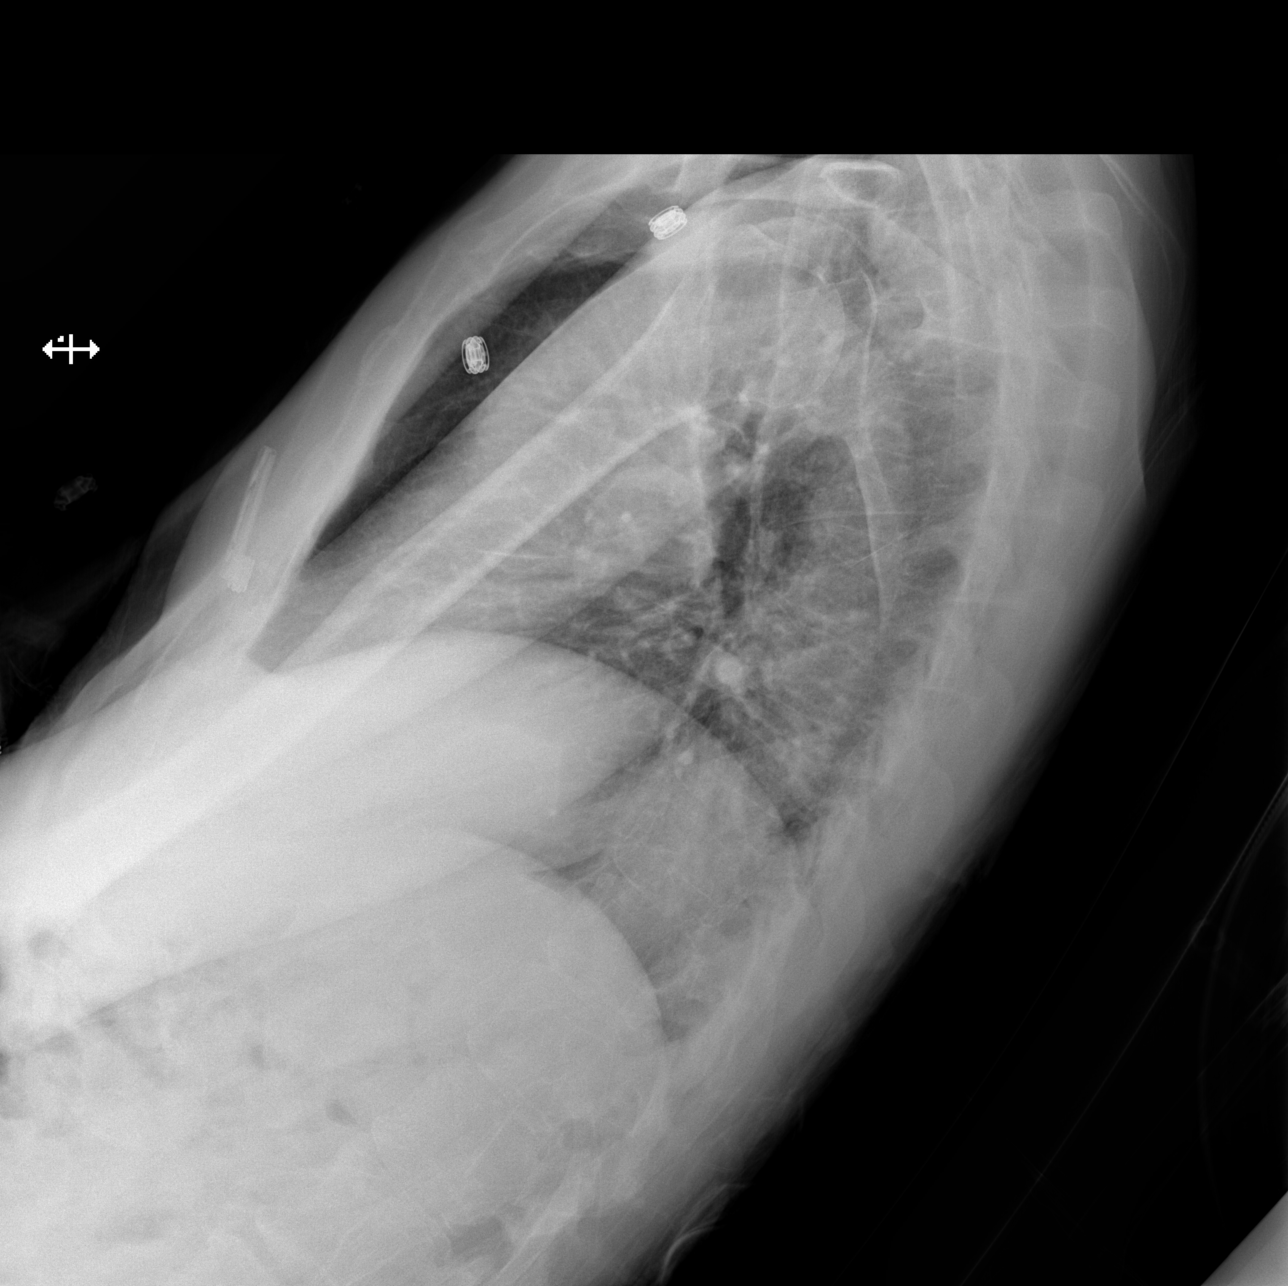

[x chest ap]
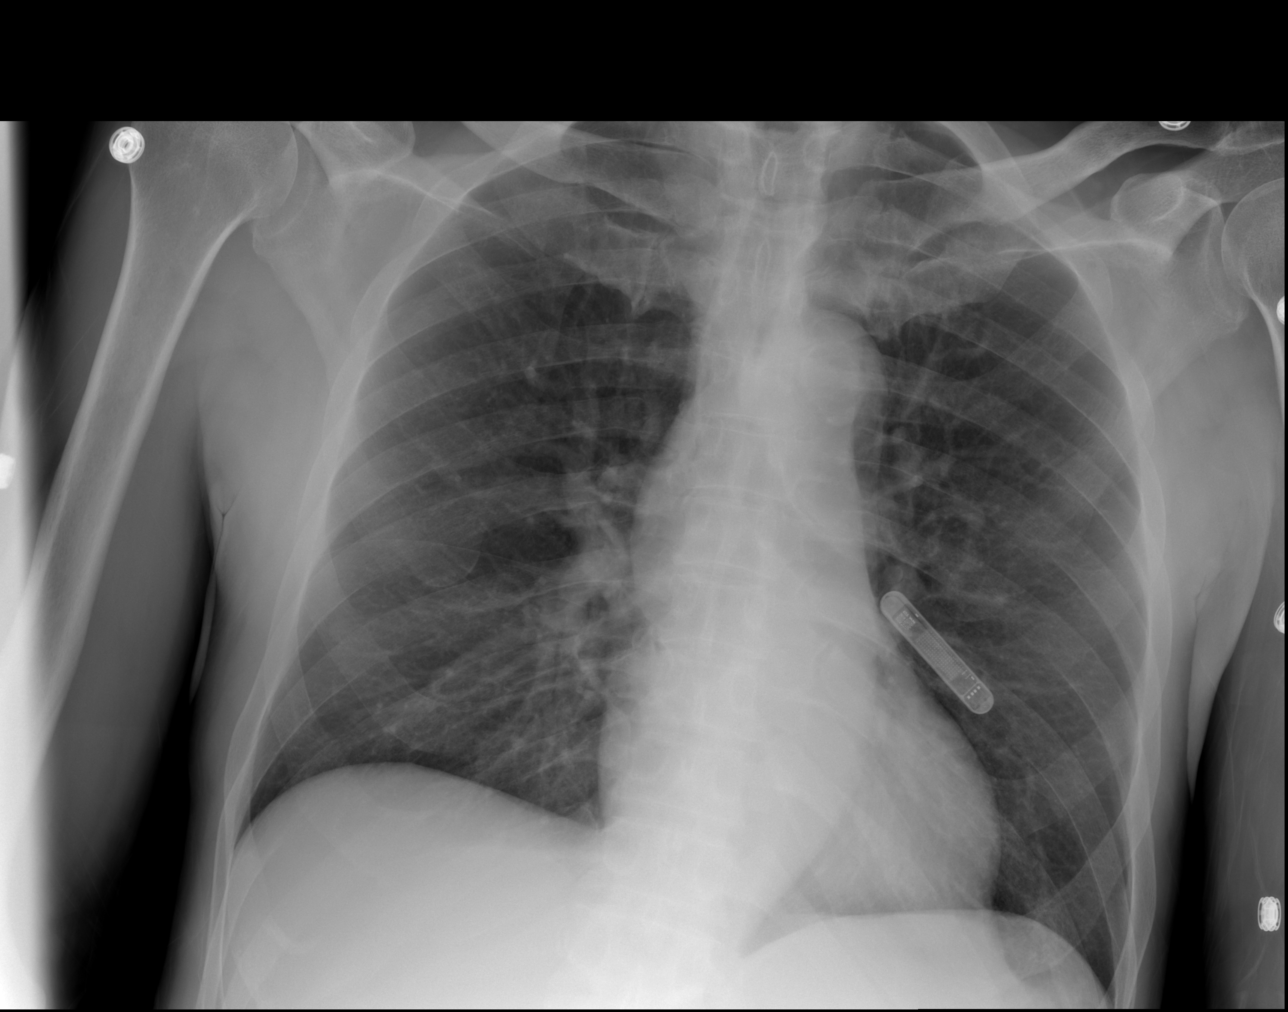

[3 of 3 positions shown; findings below may reference images not displayed]

FINDINGS: Cardiac shadows within normal limits. The lungs are well aerated
bilaterally. No focal infiltrate or sizable effusion is noted. No
bony abnormality is seen. Loop recorder is noted on the left.
IMPRESSION: No active cardiopulmonary disease.

## 2019-07-20 MED ORDER — SULFAMETHOXAZOLE-TRIMETHOPRIM 800-160 MG PO TABS
1.0000 | ORAL_TABLET | Freq: Two times a day (BID) | ORAL | Status: DC
  Administered 2019-07-21 – 2019-07-22 (×4): 1 via ORAL
  Filled 2019-07-20 (×6): qty 1

## 2019-07-20 MED ORDER — LIDOCAINE HCL URETHRAL/MUCOSAL 2 % EX GEL: CUTANEOUS | Status: DC | PRN

## 2019-07-20 MED ORDER — SODIUM CHLORIDE 0.9 % IV SOLN: INTRAVENOUS | Status: DC

## 2019-07-20 NOTE — Progress Notes (Signed)
Occupational Therapy Session Note  Patient Details  Name: Russell Russell MRN: [id number] Date of Birth: 07-May-1950  Today's Date: 07/20/2019 OT Individual Time: 1050-1200 OT Individual Time Calculation (min): 70 min    Short Term Goals: Week 2:  OT Short Term Goal 1 (Week 2): Pt will complete bathing at sit > stand with mod assist OT Short Term Goal 2 (Week 2): Pt will don pants with mod assist sit > stand OT Short Term Goal 3 (Week 2): Pt will complete toilet transfers mod assist 3 out of 5 times to demonstate consistency  Skilled Therapeutic Interventions/Progress Updates:    Treatment session with focus on trunk control and midline posture in sitting and standing.  Pt received asleep in TIS, however easily arousable.  Attempted stand pivot transfer this session, however due to increased pushing resumed squat pivot transfers with pt able to complete min-mod assist.  Engaged in weight shifting in standing with focus on shifting hip and shoulder to Rt to target.  Pt able to bring hip to Rt but no shoulder without max manual facilitation.  Utilized [employer] for [employer] with facilitation at hips and shoulders for weight shifting and upright posture.  Engaged in scooting forward/backward and laterally to challenge weight shifting and increased awareness of Lt side of body.  Engaged in reaching activity with RUE to incorporate reaching/weight shifting further Rt and then trunk rotation to place item on Lt.  Engaged in reaching with LUE in sitting with focus on increased shoulder elevation and functional use.  Pt required hand over hand intermittently and facilitation at elbow to increase range with reaching.  Engaged in active rest break in supine with focus on awareness of LLE in space and cues to maintain hips in midline.  Engaged in 2 sets of 5 chest presses in supine with dowel for symmetrical movements, cues to visually attend to LUE during all tasks to improve functional movement.  Engaged in  sit > partial stand with focus on motor control and awareness of midline, pt with increased pushing to Lt with partial stand and squats.  Completed squat pivot transfer back to TIS w/c with mod assist.  Pt remained tilted back in w/c for positioning and comfort and left with all needs in reach and seat belt alarm on.    Therapy Documentation Precautions:  Precautions Precautions: Fall, Other (comment) Precaution Comments: permissive HTN initially (<220/120) then normotension, L sided weakness, aspiration precautions (dysphagia 2 with thins), ; L hemiparesis Restrictions Weight Bearing Restrictions: No Pain:  Pt with no c/o pain   Therapy/Group: Individual Therapy  [name] 07/20/2019, 12:26 PM

## 2019-07-20 NOTE — Progress Notes (Signed)
Physical Therapy Session Note  Patient Details  Name: Russell Russell MRN: [id number] Date of Birth: [birth year]  Today's Date: 07/20/2019 PT Individual Time: 1425-1535 and 900-949 PT Individual Time Calculation (min): 70 min  And 49 min   Short Term Goals: Week 1:  PT Short Term Goal 1 (Week 1): pt will transfer supine<>sitting EOB with mod A PT Short Term Goal 1 - Progress (Week 1): Met PT Short Term Goal 2 (Week 1): Pt will transfer sit<>stand with LRAD max A PT Short Term Goal 2 - Progress (Week 1): Met PT Short Term Goal 3 (Week 1): Pt will transfer bed<>chair with LRAD max A PT Short Term Goal 3 - Progress (Week 1): Met Week 2:  PT Short Term Goal 1 (Week 2): pt will transfer bed<>chair min A PT Short Term Goal 2 (Week 2): pt will transfer sit<>stand CGA PT Short Term Goal 3 (Week 2): Pt will ambulate 35f with LRAD max A of 1 Week 3:     Skilled Therapeutic Interventions/Progress Updates:     Session one PAIN Pt denies pain this am  Pt initially supine and agreeable to treatment w/focus on wt shifting/midline orientation.  Pt found to have been incontinent of urine and in need of brief change despite being asked if his brief was dry and him indicating that it was. Pt able to bridge while therapist removed soiled brief.  Therapist provided pt w/clean washcloths and pt able to perform peri hygiene w/set up only using R UE.  Clean brief donned by therapist w/pt bridging to assist. Pt lifts feet for threading of pant legs by therapist and max assist to raise pants w/cues for use of bilat UEs, hand over hand placement to grasp L side of waistband w/L hand, pt bridges also to assist.  Supine to sit w/mod assist and cues for midline, verbal l and visual cues to decrase tendency to push w/Rext and lean to L. In sitting pt doffs soiled shirt w/max cues to maintain midline, single balance loss to L w/mod assist for recovery. donns clean shirt w/max cues to use LUE, hand over hand assist  to use w/pulling shirt over head, max cues to return to and maintain midline w/task .therapist donns shoes total assist for time management purposes, constant cues to maintain midline Squat pivot transfer to wc w/mod assist and cues to facilitate wt shift to R, decrease pushing to L.  Pt transported to gym for continued session. Worked on static standing/midline orientation/wt shifting using wall to Pt R and mirror in front of pt.  Pt STS from chair w/max cues to wt shift towards R during transition.  Assist variesfrom max to min w/repeated efforts, cues for set up and for wt shift to R w/transition.  In standing worked on wt shifting to R and upper trunk rotation to r/protraction of L shoulder as well as trunk extension using tactile and visual cues to promote wt shfting at hips and at shoulders.  Best midline orientation achieved w/pt standing w/RUE raised w/r forearm and hand flat on wall to R and pt cued to keep elbow and hand against wall (feedback from mirror) and cues to attend to task - pt able to maintain midline x 30 sec (counting aloud to promote attention to task)  w/min assist/guarding of L knee.  Standing activity interrupted by pt urgent need to urinate.  Transported to room and assisted w/urinal, but pt unable to void.  Returned to standing activity as described above.  At end  of session, pt transported to room and Pt left oob in wc w/alarm belt set and needs in reach     Session two PAIN Denies pain  Pt initially sleeping but easily awakened.  Supine to sit w/mod assist from L side.  Initially leaning heavily to L/pushing w/RUE.  Pt transitioned to sitting in r lateral lean w/forearrm on bed x 3-4 min while therapist donned shoes.  Pt then able to sit at midline w/cga. SPT to L side w/min to mod assist.    Pt transported to gym for continued session. STS at wall w/mirror for feedback, worked on midline w/RUE raised overhead and pressed against wall to achieve midline and  trunk extension.    Pt then progressed to attempted ambulation w/goal of maintaining midline by progressing RUE along wall, however w/mult trials and max assist of 2 pt unable to maintain midline during dynamic gait task.  Mult trials w/mult techniques all result in one of the following: Excess trunk flexion Loss of midline at hips to L Loss of midline at shoulders to L and into retraction Combinations of above.   Attempted facilitation w/therapist behind/in front/and to side of pt but these were all unsuccessfull due to severe perceptual deficits of pt.  Pt then attempted tall kneeling.  STS and Stand to tall kneeling w/max of 2.  Pt unable to achieve full upright posture w/heavy tendency toward flexion at trunk likely due to perceptual deficits plus fatigue. Returned to sitting in wc max assist of 2.  In wc worked on max lateral wt shifts to R via reaching for clothespins and placing on rods in rodbox.  Pt able to perform and return to midline in supported sitting.    At end of session, pt transported to room and tilted slightly for comfort/safety. Pt left oob in wc w/alarm belt set and needs in reach   Pt w/improved static midline performance today, but unable maintain midline when shfiting to attempted dynamic standing activity.  Therapy Documentation Precautions:  Precautions Precautions: Fall, Other (comment) Precaution Comments: permissive HTN initially (<220/120) then normotension, L sided weakness, aspiration precautions (dysphagia 2 with thins), ; L hemiparesis Restrictions Weight Bearing Restrictions: No   Therapy/Group: Individual Therapy  [name], [place] 07/20/2019, 8:48 AM

## 2019-07-20 NOTE — Progress Notes (Signed)
CXR showed NAD.  Labs still pending but UA suspicious for UTI --will start septra DS empirically due to fever earlier this evening.

## 2019-07-20 NOTE — Progress Notes (Signed)
[place] PHYSICAL MEDICINE & REHABILITATION PROGRESS NOTE  Subjective/Complaints: Patient seen sitting up in bed this morning, having finished breakfast.  He states he slept well overnight.  He states he had a good weekend.  ROS: Denies CP, SOB, N/V/D  Objective: Vital Signs: Blood pressure (!) 121/96, pulse 86, temperature 98.2 F (36.8 C), temperature source Oral, resp. rate 20, height 5\' 10"  (1.778 m), weight 58.3 kg, SpO2 100 %. No results found. No results for input(s): WBC, HGB, HCT, PLT in the last 72 hours. No results for input(s): NA, K, CL, CO2, GLUCOSE, BUN, CREATININE, CALCIUM in the last 72 hours.  Physical Exam: BP (!) 121/96 (BP Location: Left Arm)   Pulse 86   Temp 98.2 F (36.8 C) (Oral)   Resp 20   Ht 5\' 10"  (1.778 m)   Wt 58.3 kg   SpO2 100%   BMI 18.44 kg/m  Constitutional: No distress . Vital signs reviewed. HENT: Normocephalic.  Atraumatic. Eyes: EOMI. No discharge. Cardiovascular: No JVD. Respiratory: Normal effort.  No stridor. GI: Non-distended. Skin: Warm and dry.  Intact. Psych: Normal mood.  Normal behavior. Musc: No edema in extremities.  No tenderness in extremities. Neuro: Alert Motor: Left upper extremity: Shoulder abduction 4/5, distally 4/5, unchanged Left lower extremity: Hip flexion 3/5, +knee extension 3/5, ankle dorsiflexion 4/5 Dysarthria Left facial weakness, stable.   Assessment/Plan: 1. Functional deficits secondary to multiple anterior and posterior right brain infarcts which require 3+ hours per day of interdisciplinary therapy in a comprehensive inpatient rehab setting.  Physiatrist is providing close team supervision and 24 hour management of active medical problems listed below.  Physiatrist and rehab team continue to assess barriers to discharge/monitor patient progress toward functional and medical goals  Care Tool:  Bathing    Body parts bathed by patient: Right arm, Left arm, Chest, Abdomen, Right upper leg,  Left upper leg, Face, Front perineal area, Right lower leg, Left lower leg   Body parts bathed by helper: Buttocks     Bathing assist Assist Level: Moderate Assistance - Patient 50 - 74%     Upper Body Dressing/Undressing Upper body dressing   What is the patient wearing?: Pull over shirt    Upper body assist Assist Level: Contact Guard/Touching assist    Lower Body Dressing/Undressing Lower body dressing      What is the patient wearing?: Pants, Incontinence brief     Lower body assist Assist for lower body dressing: Minimal Assistance - Patient > 75%     Toileting Toileting Toileting Activity did not occur (Clothing management and hygiene only): N/A (no void or bm)  Toileting assist Assist for toileting: 2 Helpers     Transfers Chair/bed transfer  Transfers assist     Chair/bed transfer assist level: Minimal Assistance - Patient > 75%     Locomotion Ambulation   Ambulation assist   Ambulation activity did not occur: Safety/medical concerns(L hemi, decreased postural control/balance, fatigue)  Assist level: 2 helpers Assistive device: Other (comment)(wall on R side without rail) Max distance: 5 ft   Walk 10 feet activity   Assist  Walk 10 feet activity did not occur: Safety/medical concerns(L hemi, decreased postural control/balance, fatigue)  Assist level: 2 helpers Assistive device: Lite Gait   Walk 50 feet activity   Assist Walk 50 feet with 2 turns activity did not occur: Safety/medical concerns(L hemi, decreased postural control/balance, fatigue)         Walk 150 feet activity   Assist Walk 150 feet activity  did not occur: Safety/medical concerns(L hemi, decreased postural control/balance, fatigue)         Walk 10 feet on uneven surface  activity   Assist Walk 10 feet on uneven surfaces activity did not occur: Safety/medical concerns(L hemi, decreased postural control/balance, fatigue)         Wheelchair     Assist  Will patient use wheelchair at discharge?: Yes Type of Wheelchair: Manual    Wheelchair assist level: Dependent - Patient 0%      Wheelchair 50 feet with 2 turns activity    Assist        Assist Level: Dependent - Patient 0%   Wheelchair 150 feet activity     Assist     Assist Level: Dependent - Patient 0%      Medical Problem List and Plan: 1.  Left side weakness secondary to multiple scattered anterior and posterior right brain infarcts.  Status post loop recorder  Continue CIR 2.  Antithrombotics: -DVT/anticoagulation: Lovenox             -antiplatelet therapy: Aspirin 325 mg daily and Plavix 75 mg daily x3 months then aspirin alone 3. Pain Management: Tylenol as needed 4. Mood: Provide emotional support             -antipsychotic agents: N/A 5. Neuropsych: This patient is ?fully capable of making decisions on his own behalf. 6. Skin/Wound Care: Routine skin checks 7. Fluids/Electrolytes/Nutrition: Routine in and outs.  BMP within acceptable range on 5/3, labs ordered for tomorrow 8.  Hypertension.  Lopressor 12.5 mg twice daily, increased to 25 on 5/8.    Lisinopril 2.5 qhs started on 5/5  Diastolic slightly elevated on 5/10  Monitor with increased mobility 9.  Hyperlipidemia. Lipitor 10.  Dysphagia.  Dysphagia #3 thins liquids.  Follow-up speech therapy  Cont to advance diet as tolerated 11.  History of tobacco and alcohol use.  NicoDerm patch.  Provide counseling.    Monitor for withdrawal 12.  Moderate hypoalbuminemia  Supplement initiated on 4/28 13. Leukopenia  WBCs 3.5 on 5/7, labs pending  Cont to monitor  LOS: 13 days A FACE TO FACE EVALUATION WAS PERFORMED  [place] Lorie Phenix 07/20/2019, 8:26 AM

## 2019-07-20 NOTE — Progress Notes (Addendum)
Vitals were taken & patient had a temperature, elevated pulse & blood pressure. His nurse was informed & his vitals were rechecked. Provider was called & new orders were given. Mews protocol is in progress. He was found to have been incontinent, hygiene care was given & he was catheterized for a urine specimen. IV fluids were hung & cultures drawn. Awaiting chest xray. No c/o pain or discomfort. His temperature & pulse have come down. No acute distress noted.He is sleeping at this time.

## 2019-07-20 NOTE — Progress Notes (Signed)
Patient febrile this pm--was a little lethargic this afternoon per nurse. No complaints of cough or dysuria-per therapy note--he did have difficulty voiding and has been incontinent. Question retention with overflow voiding. Has had rise in WBC today --baseline around 3-4. Will pan culture and repeat labs. Repeat vitals show that temp coming down. Await labs to start antibiotics.

## 2019-07-21 ENCOUNTER — Inpatient Hospital Stay (HOSPITAL_COMMUNITY): Payer: BC Managed Care – PPO | Admitting: Physical Therapy

## 2019-07-21 ENCOUNTER — Inpatient Hospital Stay (HOSPITAL_COMMUNITY): Payer: BC Managed Care – PPO | Admitting: Occupational Therapy

## 2019-07-21 ENCOUNTER — Inpatient Hospital Stay (HOSPITAL_COMMUNITY): Payer: BC Managed Care – PPO

## 2019-07-21 DIAGNOSIS — R509 Fever, unspecified: Secondary | ICD-10-CM

## 2019-07-21 DIAGNOSIS — R799 Abnormal finding of blood chemistry, unspecified: Secondary | ICD-10-CM

## 2019-07-21 DIAGNOSIS — R5081 Fever presenting with conditions classified elsewhere: Secondary | ICD-10-CM

## 2019-07-21 DIAGNOSIS — A419 Sepsis, unspecified organism: Secondary | ICD-10-CM

## 2019-07-21 LAB — URINE CULTURE: Special Requests: NORMAL

## 2019-07-21 LAB — PROCALCITONIN: Procalcitonin: 0.65 ng/mL

## 2019-07-21 NOTE — Progress Notes (Signed)
[place] PHYSICAL MEDICINE & REHABILITATION PROGRESS NOTE  Subjective/Complaints: Patient seen sitting up in bed this morning, without a shirt on.  He states he slept well overnight.  He denies any symptoms, however when asked, he does note that he feels a little hot.  Per chart review and discussion with nursing, patient was noted to have fevers overnight as well as low-grade temp this morning.  ROS: Denies CP, SOB, N/V/D  Objective: Vital Signs: Blood pressure 124/85, pulse 95, temperature 99.1 F (37.3 C), resp. rate 18, height 5\' 10"  (1.778 m), weight 58.3 kg, SpO2 98 %. DG Chest 2 View  Result Date: 07/20/2019 CLINICAL DATA:  Fevers EXAM: CHEST - 2 VIEW COMPARISON:  None. FINDINGS: Cardiac shadows within normal limits. The lungs are well aerated bilaterally. No focal infiltrate or sizable effusion is noted. No bony abnormality is seen. Loop recorder is noted on the left. IMPRESSION: No active cardiopulmonary disease. Electronically Signed   By: Inez Catalina M.D.   On: 07/20/2019 22:31   Recent Labs    07/20/19 1000 07/20/19 2112  WBC 8.7 12.5*  HGB 16.7 15.7  HCT 49.0 45.6  PLT 263 245   Recent Labs    07/20/19 2112  NA 137  K 4.7  CL 105  CO2 23  GLUCOSE 132*  BUN 27*  CREATININE 1.18  CALCIUM 9.9    Physical Exam: BP 124/85 (BP Location: Right Arm)   Pulse 95   Temp 99.1 F (37.3 C)   Resp 18   Ht 5\' 10"  (1.778 m)   Wt 58.3 kg   SpO2 98%   BMI 18.44 kg/m  Constitutional: No distress . Vital signs reviewed. HENT: Normocephalic.  Atraumatic. Eyes: EOMI. No discharge. Cardiovascular: No JVD. Respiratory: Normal effort.  No stridor. GI: Non-distended. Skin: Warm and dry.  Intact. Psych: Normal mood.  Normal behavior. Musc: No edema in extremities.  No tenderness in extremities. Neuro: Alert Motor: Left upper extremity: Shoulder abduction 4/5, distally 4/5, unchanged Left lower extremity: Hip flexion 3-/5, +knee extension 3-/5, ankle dorsiflexion  4/5 Dysarthria, unchanged Left facial weakness, stable.   Assessment/Plan: 1. Functional deficits secondary to multiple anterior and posterior right brain infarcts which require 3+ hours per day of interdisciplinary therapy in a comprehensive inpatient rehab setting.  Physiatrist is providing close team supervision and 24 hour management of active medical problems listed below.  Physiatrist and rehab team continue to assess barriers to discharge/monitor patient progress toward functional and medical goals  Care Tool:  Bathing    Body parts bathed by patient: Right arm, Left arm, Chest, Abdomen, Right upper leg, Left upper leg, Face, Front perineal area, Right lower leg, Left lower leg   Body parts bathed by helper: Buttocks     Bathing assist Assist Level: Moderate Assistance - Patient 50 - 74%     Upper Body Dressing/Undressing Upper body dressing   What is the patient wearing?: Pull over shirt    Upper body assist Assist Level: Contact Guard/Touching assist    Lower Body Dressing/Undressing Lower body dressing      What is the patient wearing?: Pants, Incontinence brief     Lower body assist Assist for lower body dressing: Minimal Assistance - Patient > 75%     Toileting Toileting Toileting Activity did not occur (Clothing management and hygiene only): N/A (no void or bm)  Toileting assist Assist for toileting: 2 Helpers     Transfers Chair/bed transfer  Transfers assist     Chair/bed transfer assist  level: Moderate Assistance - Patient 50 - 74%     Locomotion Ambulation   Ambulation assist   Ambulation activity did not occur: Safety/medical concerns(L hemi, decreased postural control/balance, fatigue)  Assist level: 2 helpers Assistive device: Other (comment)(wall on R side without rail) Max distance: 5 ft   Walk 10 feet activity   Assist  Walk 10 feet activity did not occur: Safety/medical concerns(L hemi, decreased postural control/balance,  fatigue)  Assist level: 2 helpers Assistive device: Lite Gait   Walk 50 feet activity   Assist Walk 50 feet with 2 turns activity did not occur: Safety/medical concerns(L hemi, decreased postural control/balance, fatigue)         Walk 150 feet activity   Assist Walk 150 feet activity did not occur: Safety/medical concerns(L hemi, decreased postural control/balance, fatigue)         Walk 10 feet on uneven surface  activity   Assist Walk 10 feet on uneven surfaces activity did not occur: Safety/medical concerns(L hemi, decreased postural control/balance, fatigue)         Wheelchair     Assist Will patient use wheelchair at discharge?: Yes Type of Wheelchair: Manual    Wheelchair assist level: Dependent - Patient 0%      Wheelchair 50 feet with 2 turns activity    Assist        Assist Level: Dependent - Patient 0%   Wheelchair 150 feet activity     Assist     Assist Level: Dependent - Patient 0%      Medical Problem List and Plan: 1.  Left side weakness secondary to multiple scattered anterior and posterior right brain infarcts.  Status post loop recorder  Continue CIR  Therapy notes reviewed-min assist for lower body dressing, mod assist for transfers. 2.  Antithrombotics: -DVT/anticoagulation: Lovenox             -antiplatelet therapy: Aspirin 325 mg daily and Plavix 75 mg daily x3 months then aspirin alone 3. Pain Management: Tylenol as needed 4. Mood: Provide emotional support             -antipsychotic agents: N/A 5. Neuropsych: This patient is ?fully capable of making decisions on his own behalf. 6. Skin/Wound Care: Routine skin checks 7. Fluids/Electrolytes/Nutrition: Routine in and outs. 8.  Hypertension.  Lopressor 12.5 mg twice daily, increased to 25 on 5/8.    Lisinopril 2.5 qhs started on 5/5  Controlled on 5/11  Monitor with increased mobility 9.  Hyperlipidemia. Lipitor 10.  Dysphagia.  Dysphagia #3 thins liquids.   Follow-up speech therapy  Cont to advance diet as tolerated 11.  History of tobacco and alcohol use.  NicoDerm patch.  Provide counseling.    Monitor for withdrawal 12.  Moderate hypoalbuminemia  Supplement initiated on 4/28 13. Leukopenia, now with leukocytosis  WBCs 12.5 on 5/10, labs ordered for tomorrow  Cont to monitor 13.  Elevated BUN  Labs ordered for tomorrow  Echocardiogram reviewed, EF ~60%  IVF started, will continue today 14.  Sepsis-likely urosepsis  See #12  Febrile overnight, with low-grade temp this morning  UA suspicious, urine culture pending  Blood cultures pending  Chest x-ray personally reviewed, unremarkable for infection  Empiric Septra started   LOS: 14 days A FACE TO FACE EVALUATION WAS PERFORMED  Arieh Bogue Lorie Phenix 07/21/2019, 8:26 AM

## 2019-07-21 NOTE — Progress Notes (Signed)
   07/20/19 2000  Assess: MEWS Score  Temp (!) 102.5 F (39.2 C)  BP (!) 133/98  Pulse Rate (!) 115  Resp 20  Level of Consciousness Alert  SpO2 100 %  O2 Device Room Air  Patient Activity (if Appropriate) In bed  Assess: MEWS Score  MEWS Temp 2  MEWS Systolic 0  MEWS Pulse 2  MEWS RR 0  MEWS LOC 0  MEWS Score 4  MEWS Score Color Red  Assess: if the MEWS score is Yellow or Red  Were vital signs taken at a resting state? Yes  Focused Assessment Documented focused assessment  Early Detection of Sepsis Score *See Row Information* Medium  MEWS guidelines implemented *See Row Information* Yes  Treat  MEWS Interventions Administered scheduled meds/treatments;Administered prn meds/treatments;Escalated (See documentation below)  Take Vital Signs  Increase Vital Sign Frequency  Red: Q 1hr X 4 then Q 4hr X 4, if remains red, continue Q 4hrs  Escalate  MEWS: Escalate Red: discuss with charge nurse/RN and provider, consider discussing with RRT  Notify: Charge Nurse/RN  Name of Charge Nurse/RN Notified Lysle Morales  Date Charge Nurse/RN Notified 07/20/19  Time Charge Nurse/RN Notified 2005  Notify: Provider  Provider Name/Title Reesa Chew  Date Provider Notified 07/20/19  Time Provider Notified 2025  Notification Type Call  Notification Reason Change in status  Response See new orders  Date of Provider Response 07/20/19  Time of Provider Response 2027  Document  Patient Outcome Stabilized after interventions  Progress note created (see row info) Yes   Pt's vitals taken @1940  and MEWs yellow due to pulse, vitals retaken @2000  and MEWS red due to pulse and temp. Pt had no c/o when focussed assessment performed. Protocol started, and new orders placed. Charge Nurse and Provider made aware.

## 2019-07-21 NOTE — Progress Notes (Signed)
At 0450, Pt was slightly tachycardic, HR of 102. Pt's temp was 99.7, prn Tylenol was administered. Temp rechecked at 0550, and it was 98.1. Pt originally had ice packs applied but has refused them.

## 2019-07-21 NOTE — Progress Notes (Addendum)
   07/21/19 1213  Assess: MEWS Score  Temp 98.3 F (36.8 C)  BP 97/77  Pulse Rate 93  Resp (!) 24  Level of Consciousness Alert  SpO2 100 %  O2 Device Room Air  Assess: MEWS Score  MEWS Temp 0  MEWS Systolic 1  MEWS Pulse 0  MEWS RR 1  MEWS LOC 0  MEWS Score 2  MEWS Score Color Yellow  Assess: if the MEWS score is Yellow or Red  Were vital signs taken at a resting state? Yes  Early Detection of Sepsis Score *See Row Information* Medium  MEWS guidelines implemented *See Row Information* Yes  Treat  MEWS Interventions Escalated (See documentation below)  Take Vital Signs  Increase Vital Sign Frequency  Yellow: Q 2hr X 2 then Q 4hr X 2, if remains yellow, continue Q 4hrs  Escalate  MEWS: Escalate Yellow: discuss with charge nurse/RN and consider discussing with provider and RRT  Notify: Charge Nurse/RN  Name of Charge Nurse/RN Notified Genene Churn, RN  Date Charge Nurse/RN Notified 07/21/19  Time Charge Nurse/RN Notified [id number]  Notify: Provider  Provider Name/Title [name], PA  Date Provider Notified 07/21/19  Time Provider Notified 1222  Notification Type Call  Notification Reason Other (Comment) (yellow MEWs triggered.)  Response Other (Comment) (continue current tx. pt Currently on IVF and PO antibiotics.)  Date of Provider Response 07/21/19  Time of Provider Response [id number]  Document  Patient Outcome Other (Comment) (in no acute distress. pt at beaseline state. )  Progress note created (see row info) Yes  Yellow MEWs triggered again due to respiratory rate of 24. Early detection of sepsis score now 13. Linna Hoff, PA made aware. Ordered to continue current treatment plan. No other new orders. Will continue current plan of care.   Gerald Stabs, RN

## 2019-07-21 NOTE — Progress Notes (Signed)
Rapid response RN contacted to assist with assessment of pt, given sepsis and MEWs scores throughout the day. Rapid response RN stopped by and assessed pt. Recommended to continue per current plan of care.  Pt is alert and interactive. In no acute distress. Vital signs are now WNL. Early Detection of Sepsis is yellow with score of 4. No c/o pain or discomfort at this time. Currently resting in bed with wife at bedside. Continue plan of care.  Gerald Stabs, RN

## 2019-07-21 NOTE — Significant Event (Signed)
Rapid Response Event Note  Overview: Arrival Time: 1500 Event Type: Other (Comment)(Second set of eyes) Asked to evaluate patient at request of the primary RN. RN concerned that pt may be becoming septic.  Initial Focused Assessment: Pt had just completed his physical therapy session. Awake, alert lying in bed. Pt is able to follow commands. Pt appears in no distress. Warm, dry to touch. Lung sounds are clear. No edema noted. Per his visitor, pt looks better today. Procalcitonin 0.65.   Interventions: -No intervention from RR RN  Plan of Care (if not transferred): Review of chart reveals pt has had intermittent fevers. With fevers he has became tachycardic, HR up to 115 bpm, or mildly hypotensive, SBP 89. Blood cultures have shown no growth. Urine cultures pending. WBC increased from 8.7 to 12.5. PO Bactrim started and maintenance fluids going at 123mL/hr. Pt fever responsive to Tylenol.   Continue current plan of care. Treat fevers with PRN Tylenol and ice packs.   Call rapid response for additional needs.  Event Summary: Outcome: Stayed in room and stabalized Event End Time: [place]

## 2019-07-21 NOTE — Progress Notes (Signed)
Sepsis detection score noted to be red with score of 9. Labs and temp reviewed with Jeannene Patella, PA and Linna Hoff, [place]. Other vitals signs at baseline.  Pt currently on IV fluids running at 100/hr and Bactrim PO. No new orders at this time. Continue plan of care.   Gerald Stabs, RN

## 2019-07-21 NOTE — Progress Notes (Signed)
Speech Language Pathology Daily Session Note  Patient Details  Name: Russell Russell MRN: [id number] Date of Birth: Jul 11, 1950  Today's Date: 07/21/2019 SLP Individual Time: [id number] SLP Individual Time Calculation (min): 25 min  Short Term Goals: Week 2: SLP Short Term Goal 1 (Week 2): Patient will utilize speech intelligibility strategies with Min verbal cues to achieve ~90% intelligibility at the sentence level. SLP Short Term Goal 2 (Week 2): Pt will demonstrate efficient mastication and oral clearance of upgraded dysphagia 3 texture diet with Supervision A verbal cues for use of swallow strategies for oral clarance and to minimize anterior loss SLP Short Term Goal 3 (Week 2): Patient will name 2 physical and 2 cognitive changes with Mod verbal cues. SLP Short Term Goal 4 (Week 2): Patient will demonstrate functional problem solving for basic and familiar tasks with Min verbal cues. SLP Short Term Goal 5 (Week 2): Patient will utilize external aids for recall of functional information with Mod verbal and visual cues.  Skilled Therapeutic Interventions: Skilled ST services focused on swallow skills. Pt demonstrated recall of swallow strategies with supervision A verbal cues. SLP facilitated PO consumption of breakfast tray containing dys 3 textures and thin liquid via straw, pt demonstrated mod I utilizing right side to masticate bolus and required extra time for mastication/bolus cohesion likely due to missing denition. Pt demonstrated x1 delayed cough when consuming solids,suggest due to oral residue, otherwise no further s/s aspiration noted. Pt demonstrated recall of EMST protocol with min A verbal cues to recall times per day. Pt was left in room with call bell within reach and bed alarm set. ST recommends to continue skilled ST services.      Pain Pain Assessment Pain Scale: 0-10 Pain Score: 0-No pain  Therapy/Group: Individual Therapy  Raheen Capili  [facility] 07/21/2019, 12:32 PM

## 2019-07-21 NOTE — Progress Notes (Signed)
Physical Therapy Weekly Progress Note  Patient Details  Name: Russell Russell MRN: [id number] Date of Birth: 1950/10/31  Beginning of progress report period: July 08, 2019 End of progress report period: Jul 21, 2019  Today's Date: 07/21/2019 PT Individual Time: 0900-0943 PT Individual Time Calculation (min): 43 min   Patient has met 1 of 3 short term goals. Overall, pt demonstrates improvements with functional mobility/transfers, LE strength, dynamic sitting/standning balance/coordination, NMR, motor control, and endurance. Pt typically requires CGA/supervision for supine<>sit, min A for stand<>pivot and sit<>stand transfers, and +2/+3 assist to ambulate 47f in LiteGait. However, pt with recent bladder infection and fever resulting in functional decline. Today pt required mod A for bed mobility, mod/max A +2 (for safety) for squat<>pivot transfers, and max A +2 to transfer sit<>stand.   Patient continues to demonstrate the following deficits muscle weakness, impaired timing and sequencing, unbalanced muscle activation, decreased coordination and decreased motor planning and decreased standing balance, decreased postural control, hemiplegia and decreased balance strategies and therefore will continue to benefit from skilled PT intervention to increase functional independence with mobility.  Patient progressing toward long term goals..  Continue plan of care.  PT Short Term Goals Week 2:  PT Short Term Goal 1 (Week 2): pt will transfer bed<>chair min A PT Short Term Goal 1 - Progress (Week 2): Met PT Short Term Goal 2 (Week 2): pt will transfer sit<>stand CGA PT Short Term Goal 2 - Progress (Week 2): Progressing toward goal PT Short Term Goal 3 (Week 2): Pt will ambulate 251fwith LRAD max A of 1 PT Short Term Goal 3 - Progress (Week 2): Progressing toward goal Week 3:  PT Short Term Goal 1 (Week 3): pt will transfer sit<>stand CGA PT Short Term Goal 2 (Week 3): Pt will ambulate 2522fith  LRAD max A of 1 PT Short Term Goal 3 (Week 3): Pt will perform WC mobility [id number] Therapeutic Interventions/Progress Updates:  Ambulation/gait training;Discharge planning;Functional mobility training;Psychosocial support;Therapeutic Activities;Balance/vestibular training;Disease management/prevention;Neuromuscular re-education;Therapeutic Exercise;Wheelchair propulsion/positioning;Cognitive remediation/compensation;DME/adaptive equipment instruction;Splinting/orthotics;UE/LE Strength taining/ROM;Pain management;Community reintegration;Functional electrical stimulation;Patient/family education;Stair training;UE/LE Coordination activities   Today's Interventions: Received pt supine in bed with NT cleaning up pt, PT took over with care. Pt agreeable to therapy and denied any pain during session. Session focused on functional mobility/transfers, LE strength, dynamic sitting and standing balance/coordination, NMR, motor control, midline orientation and pelvic alignment, and improved activity tolerance. Donned pants in supine with mod A and encouragement to use L UE. Pt transferred supine<>sitting EOB with mod A and donned gown mod A. Pt required mod/max A for sitting balance throughout today's session. Donned shoes with total assist +2 to maintain static sitting balance. Pt transferred squat<>pivot bed<>TIS WC mod A + 2 for safety due to increased L lateral lean today. Pt transported to therapy gym [facility] and transferred squat<>pivot mod A +2 x 2 additional trials throughout session. While sitting on mat worked on dynamic sitting balance and midline orientation using mirror for visual feedback. Pt with increased L lateral and anterior lean during today's session and poor ability to correct despite maximal visual, verbal, and tactile cues. Worked on dynamic sitting balance stacking cones across midline using L UE and mod A. Pt with decreased ability to correct anterior lean, requiring max A for  safety. Pt frequently thinking that he grabbed cone with L UE and proceeding to move across midline to stack it, completely unaware that cone was not in his hand. PA present discussing recent baldder infection  and fever with pt and how recent medical complications impact pt's current mobility level. Pt transferred sit<>stand x 2 trials max A +2 for safety using mirror for visual feedback. Trial 1: pt's arm supported around therapist and pt with strong L pushing and unable to come to upright position due to strong anterior lean. Trial 2: 3 musketeer assist to come to upright position with manual facilitation to block L knee buckling and to bring shoulders down and posteriorly. However, pt only able to remain standing approximately 15 seconds and returned to sitting position for safety concerns. Pt required multiple rest breaks throughout session due to increased fatigue. Pt transported back to room in TIS [facility] total assist. Concluded session with pt semi-reclined in TIS [facility], needs within reach, and seatbelt alarm on. Pt's wife present at bedside.   Therapy Documentation Precautions:  Precautions Precautions: Fall, Other (comment) Precaution Comments: permissive HTN initially (<220/120) then normotension, L sided weakness, aspiration precautions (dysphagia 2 with thins), ; L hemiparesis Restrictions Weight Bearing Restrictions: No  Therapy/Group: Individual Therapy Alfonse Alpers PT, DPT   07/21/2019, 7:43 AM

## 2019-07-21 NOTE — Progress Notes (Signed)
Physical Therapy Session Note  Patient Details  Name: Russell Russell MRN: [id number] Date of Birth: 1950-05-08  Today's Date: 07/21/2019 PT Individual Time: 1021-1104 PT Individual Time Calculation (min): 43 min   Short Term Goals: Week 2:  PT Short Term Goal 1 (Week 2): pt will transfer bed<>chair min A PT Short Term Goal 2 (Week 2): pt will transfer sit<>stand CGA PT Short Term Goal 3 (Week 2): Pt will ambulate 55ft with LRAD max A of 1  Skilled Therapeutic Interventions/Progress Updates:  Pt received sitting in TIS w/c with his wife present and pt agreeable to therapy session. Dan, PA cleared pt to participate in therapies.  Transported to/from gym in w/c for time management and energy conservation. R squat pivot to EOM with mod assist for lifting/pivoting hips and facilitating L UE placement for WBing and to assist with transfer. Sitting EOM participated in trunk control, midline orientation, and L UE NMR tasks to grasp horseshoes with L hand on L side of body and place in chair then to grasping bean bags from under L hip and cross body forward reach to place on stool to promote L hip elevation and anterior R lateral trunk weight shift - requires mostly CGA for safety but intermittent mod assist when reaching outside BOS. L squat pivot to w/c with mod assist for lifting/pivoting hips. Sit<>stand x3 to/from wheelchair with max assist of 1 for lifting/balance due to pushing L and max assist to block L knee flexion - immediately upon standing had pt participate in R lateral reaching task to promote R weight shift and decrease pushing. Transported back to room and therapist educated pt's wife on having him perform L LE long arc quads while in w/c for quad strengthening - pt demonstrates x5 reps with lacking ~15-20degrees of full extension. Pt left sitting in TIS w/c with needs in reach, seat belt alarm on, tilted backwards, and his wife present.  Therapy Documentation Precautions:   Precautions Precautions: Fall, Other (comment) Precaution Comments: permissive HTN initially (<220/120) then normotension, L sided weakness, aspiration precautions (dysphagia 2 with thins), ; L hemiparesis Restrictions Weight Bearing Restrictions: No  Pain:   No reports of pain throughout session.   Therapy/Group: Individual Therapy  Tawana Scale, PT, DPT 07/21/2019, 7:42 AM

## 2019-07-21 NOTE — Progress Notes (Signed)
Occupational Therapy Session Note  Patient Details  Name: Russell Russell MRN: [id number] Date of Birth: 10-05-1950  Today's Date: 07/21/2019 OT Individual Time: 1350-1500 OT Individual Time Calculation (min): 70 min    Short Term Goals: Week 2:  OT Short Term Goal 1 (Week 2): Pt will complete bathing at sit > stand with mod assist OT Short Term Goal 2 (Week 2): Pt will don pants with mod assist sit > stand OT Short Term Goal 3 (Week 2): Pt will complete toilet transfers mod assist 3 out of 5 times to demonstate consistency  Skilled Therapeutic Interventions/Progress Updates:    Treatment session with focus on trunk control and awareness of midline during sitting tasks and functional transfers.  Pt received in TIS w/c with wife present.  Pt's wife reports fevers overnight and pt with increased lethargy and fatigue.  Per PT report, pt with increased pushing this date.  Completed squat pivot transfers mod assist with cues for hand placement to decrease pushing tendencies.  Engaged in static and dynamic sitting activities, pt with increased Lt pushing this session requiring frequent redirection and active stretch to Rt to diminish pushing in midline.  Utilized therapy ball under RUE to facilitate further weight shift to Rt.  Engaged in reaching in sitting with focus on weight shifting to Rt and back to midline.  Therapist providing additional assist to facilitate weight shift and to decrease pushing Lt.  Engaged in reaching with LUE at midline, pt requiring intermittent assist to facilitate reach.  Pt demonstrating decreased attention to task, requiring increased cues to complete task.  Pt appearing more confused this session, reaching towards IV pole instead of towards stimulus at midline or to Rt of midline.  Pt required frequent rest breaks posterior on to pillows with therapist behind pt to facilitate midline stretch during rest.  Pt with improved sitting balance after each stretch but with decreased  ability to maintain.  Returned to w/c squat pivot +2 for safety and cues for hand placement to decrease pushing.  Returned to bed as above and left semi-reclined with all needs in reach.  RN arriving to assess pt.  Therapy Documentation Precautions:  Precautions Precautions: Fall, Other (comment) Precaution Comments: permissive HTN initially (<220/120) then normotension, L sided weakness, aspiration precautions (dysphagia 2 with thins), ; L hemiparesis Restrictions Weight Bearing Restrictions: No General:   Vital Signs: Therapy Vitals Temp: 98.2 F (36.8 C) Temp Source: Oral Pulse Rate: 90 Resp: 20 BP: 104/80 Patient Position (if appropriate): Sitting Oxygen Therapy SpO2: 100 % O2 Device: Room Air Patient Activity (if Appropriate): In bed Pain: Pain Assessment Pain Scale: 0-10 Pain Score: 0-No pain   Therapy/Group: Individual Therapy  [name] 07/21/2019, 3:54 PM

## 2019-07-21 NOTE — Progress Notes (Signed)
Pt went down for chest x-ray @2210   Pt arrived back on unit @2225 

## 2019-07-22 ENCOUNTER — Inpatient Hospital Stay (HOSPITAL_COMMUNITY): Payer: BC Managed Care – PPO

## 2019-07-22 ENCOUNTER — Inpatient Hospital Stay (HOSPITAL_COMMUNITY): Payer: BC Managed Care – PPO | Admitting: Occupational Therapy

## 2019-07-22 LAB — CBC WITH DIFFERENTIAL/PLATELET
Abs Immature Granulocytes: 0.03 10*3/uL (ref 0.00–0.07)
Abs Immature Granulocytes: 0.02 10*3/uL (ref 0.00–0.07)
Basophils Absolute: 0 10*3/uL (ref 0.0–0.1)
Basophils Absolute: 0 10*3/uL (ref 0.0–0.1)
Basophils Relative: 0 %
Basophils Relative: 0 %
Eosinophils Absolute: 0.1 10*3/uL (ref 0.0–0.5)
Eosinophils Absolute: 0 10*3/uL (ref 0.0–0.5)
Eosinophils Relative: 1 %
Eosinophils Relative: 0 %
HCT: 41.7 % (ref 39.0–52.0)
HCT: 42.2 % (ref 39.0–52.0)
Hemoglobin: 14.2 g/dL (ref 13.0–17.0)
Hemoglobin: 14.3 g/dL (ref 13.0–17.0)
Immature Granulocytes: 0 %
Immature Granulocytes: 0 %
Lymphocytes Relative: 7 %
Lymphocytes Relative: 6 %
Lymphs Abs: 0.8 10*3/uL (ref 0.7–4.0)
Lymphs Abs: 0.4 10*3/uL — ABNORMAL LOW (ref 0.7–4.0)
MCH: 31.8 pg (ref 26.0–34.0)
MCH: 31.3 pg (ref 26.0–34.0)
MCHC: 34.1 g/dL (ref 30.0–36.0)
MCHC: 33.9 g/dL (ref 30.0–36.0)
MCV: 93.5 fL (ref 80.0–100.0)
MCV: 92.3 fL (ref 80.0–100.0)
Monocytes Absolute: 0.9 10*3/uL (ref 0.1–1.0)
Monocytes Absolute: 0.6 10*3/uL (ref 0.1–1.0)
Monocytes Relative: 8 %
Monocytes Relative: 9 %
Neutro Abs: 8.8 10*3/uL — ABNORMAL HIGH (ref 1.7–7.7)
Neutro Abs: 6.1 10*3/uL (ref 1.7–7.7)
Neutrophils Relative %: 84 %
Neutrophils Relative %: 85 %
Platelets: 231 10*3/uL (ref 150–400)
Platelets: 221 10*3/uL (ref 150–400)
RBC: 4.46 MIL/uL (ref 4.22–5.81)
RBC: 4.57 MIL/uL (ref 4.22–5.81)
RDW: 16 % — ABNORMAL HIGH (ref 11.5–15.5)
RDW: 15.9 % — ABNORMAL HIGH (ref 11.5–15.5)
WBC: 10.6 10*3/uL — ABNORMAL HIGH (ref 4.0–10.5)
WBC: 7.2 10*3/uL (ref 4.0–10.5)
nRBC: 0 % (ref 0.0–0.2)
nRBC: 0 % (ref 0.0–0.2)

## 2019-07-22 LAB — URINALYSIS, ROUTINE W REFLEX MICROSCOPIC
Bilirubin Urine: NEGATIVE
Glucose, UA: NEGATIVE mg/dL
Hgb urine dipstick: NEGATIVE
Ketones, ur: NEGATIVE mg/dL
Nitrite: NEGATIVE
Protein, ur: 100 mg/dL — AB
Specific Gravity, Urine: 1.023 (ref 1.005–1.030)
pH: 7 (ref 5.0–8.0)

## 2019-07-22 LAB — BASIC METABOLIC PANEL
Anion gap: 9 (ref 5–15)
BUN: 15 mg/dL (ref 8–23)
CO2: 24 mmol/L (ref 22–32)
Calcium: 8.6 mg/dL — ABNORMAL LOW (ref 8.9–10.3)
Chloride: 104 mmol/L (ref 98–111)
Creatinine, Ser: 1.29 mg/dL — ABNORMAL HIGH (ref 0.61–1.24)
GFR calc Af Amer: >60 mL/min (ref >60)
GFR calc non Af Amer: 57 mL/min — ABNORMAL LOW (ref >60)
Glucose, Bld: 105 mg/dL — ABNORMAL HIGH (ref 70–99)
Potassium: 4.1 mmol/L (ref 3.5–5.1)
Sodium: 137 mmol/L (ref 135–145)

## 2019-07-22 LAB — PROCALCITONIN
Procalcitonin: 0.79 ng/mL
Procalcitonin: 0.59 ng/mL

## 2019-07-22 LAB — INFLUENZA PANEL BY PCR (TYPE A & B)
Influenza A By PCR: NEGATIVE
Influenza B By PCR: NEGATIVE

## 2019-07-22 IMAGING — DX DG CHEST 2V
2 series · 2 of 2 positions shown · non-contrast
Comparison: [DATE]

CLINICAL DATA: Fever.

EXAM:
CHEST - 2 VIEW

[chest lat]
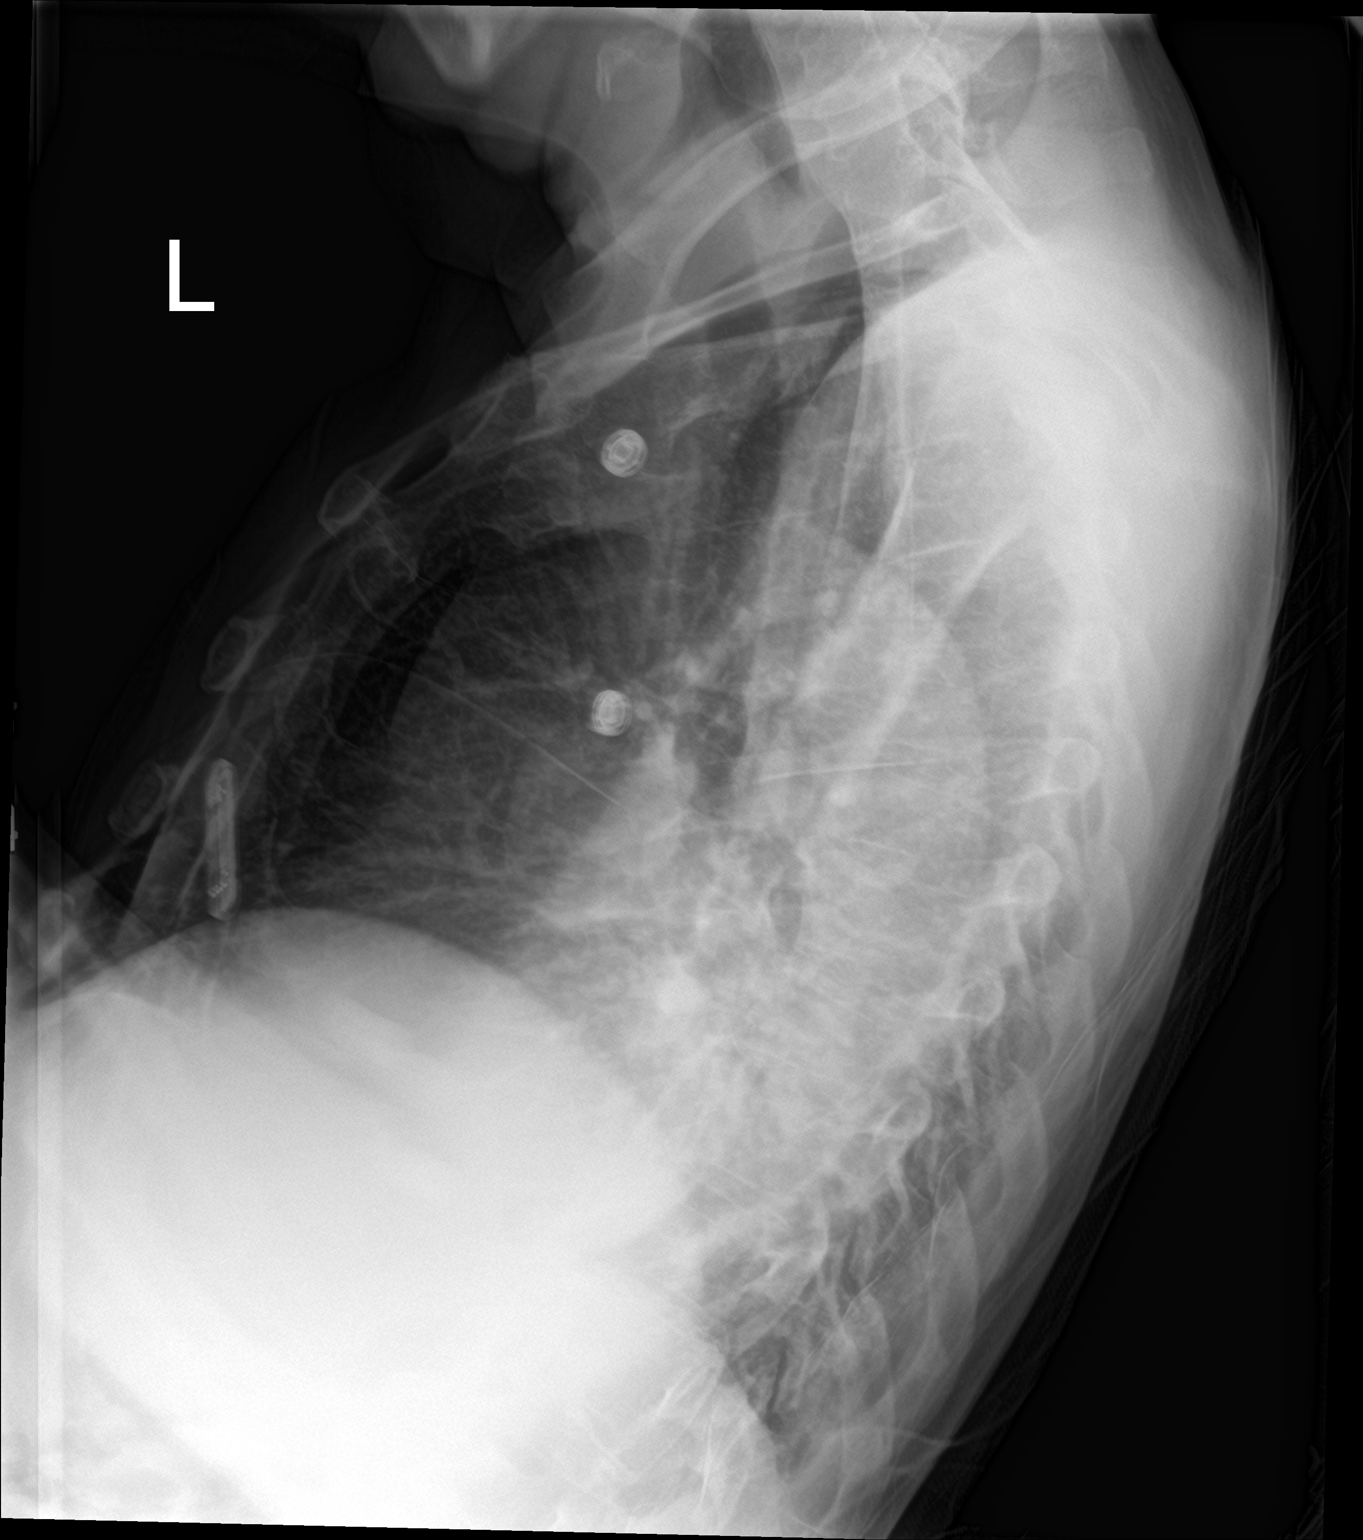

[chest ap]
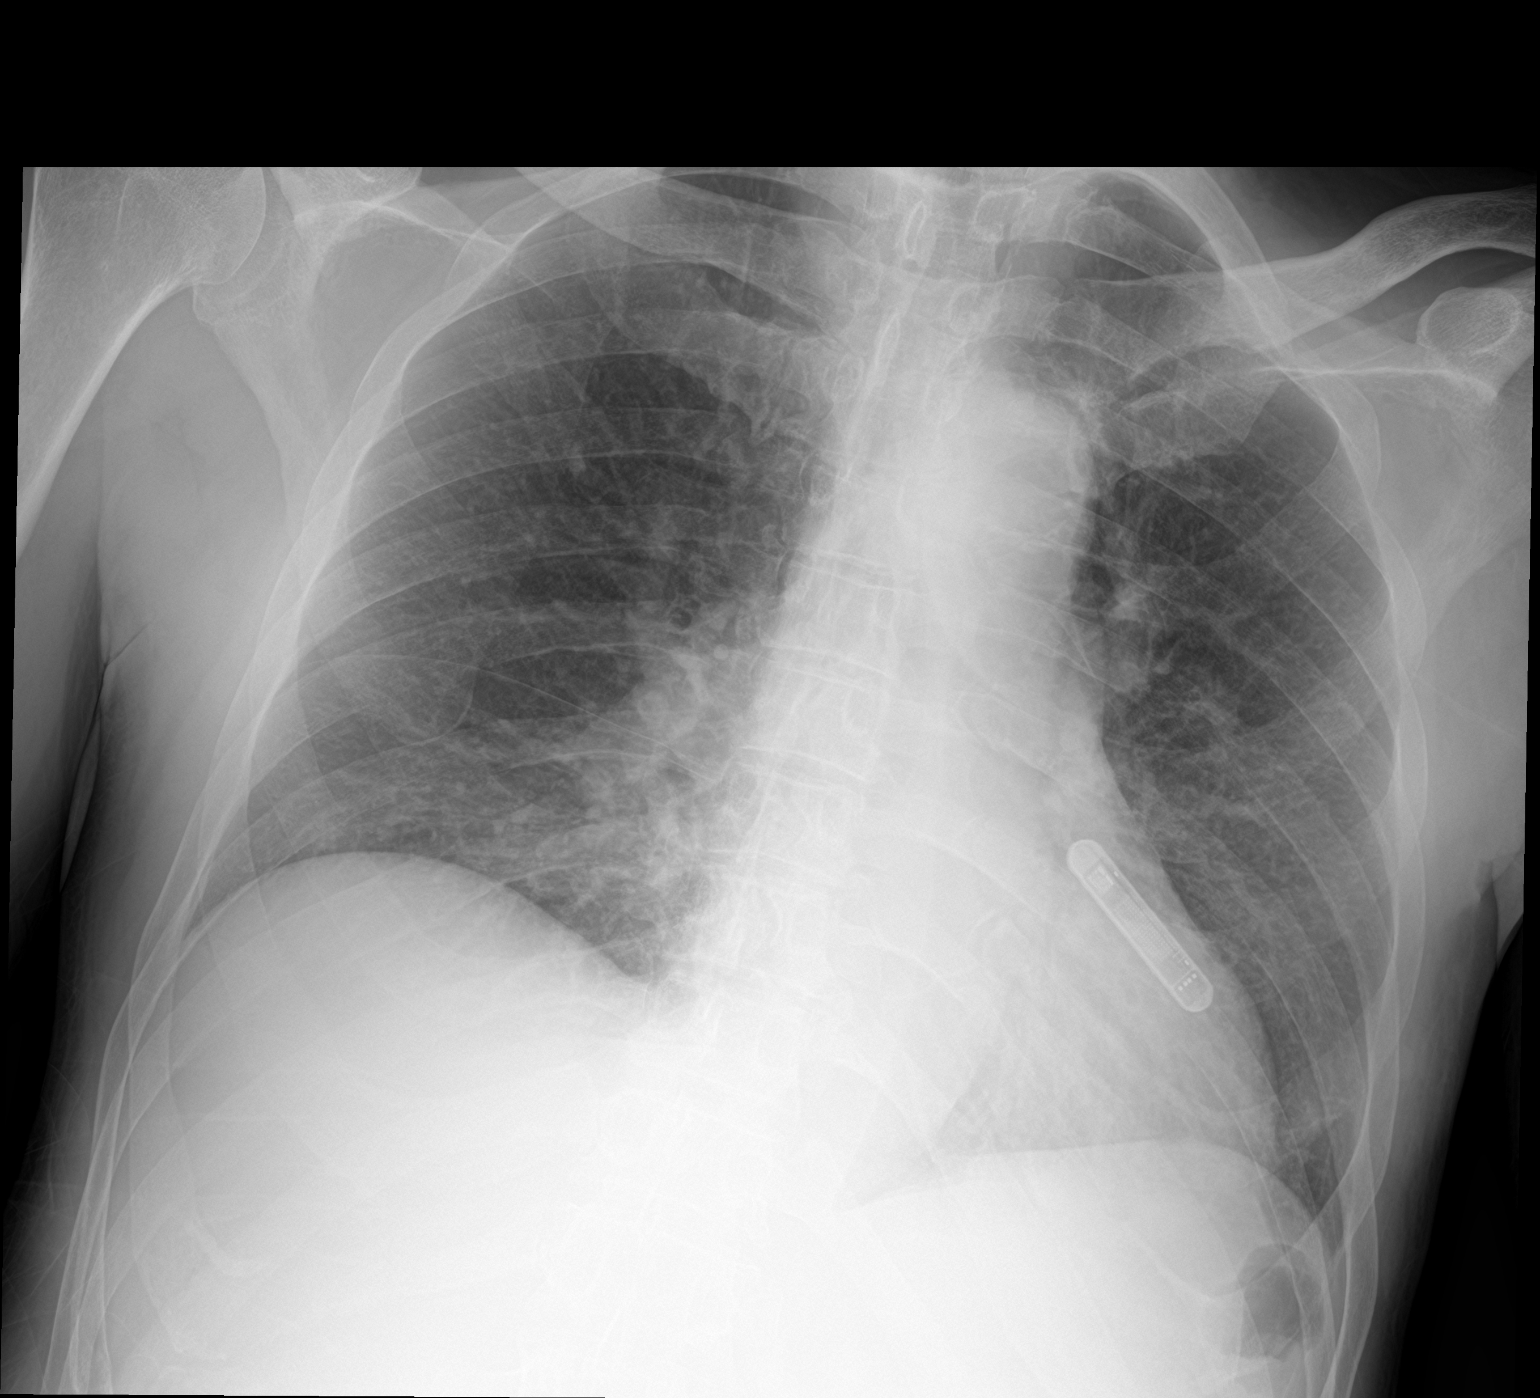

[2 of 2 positions shown; findings below may reference images not displayed]

FINDINGS: There is no evidence of acute infiltrate, pleural effusion or
pneumothorax. The heart size and mediastinal contours are within
normal limits. A loop recorder device is again noted. The visualized
skeletal structures are unremarkable.
IMPRESSION: No active cardiopulmonary disease.

## 2019-07-22 IMAGING — CT CT HEAD W/O CM
3 series · 15 of 47 positions shown, 18 images · non-contrast
Comparison: Brain MRI [DATE]

Head CT [DATE]

CLINICAL DATA: Fever of unknown origin. Recent infarct.

EXAM:
CT HEAD WITHOUT CONTRAST
TECHNIQUE: Contiguous axial images were obtained from the base of the skull
through the vertex without intravenous contrast.

[Series 3: head 5.0 h30s · axial · 0.49mm/px · z∈[-92,+48]mm · 9 of 34 slices shown, 12 images]
[im 3/34  brain]
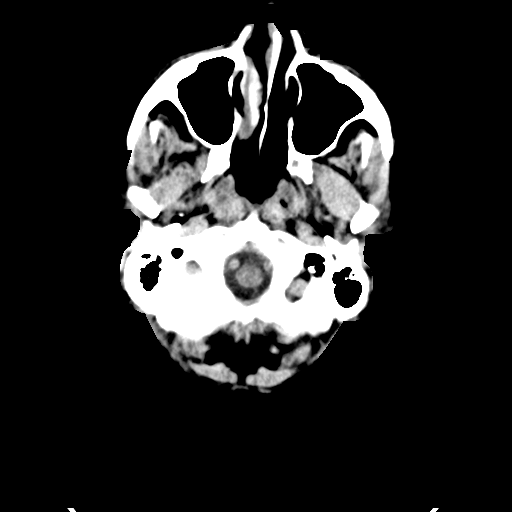
[im 3/34  bone]
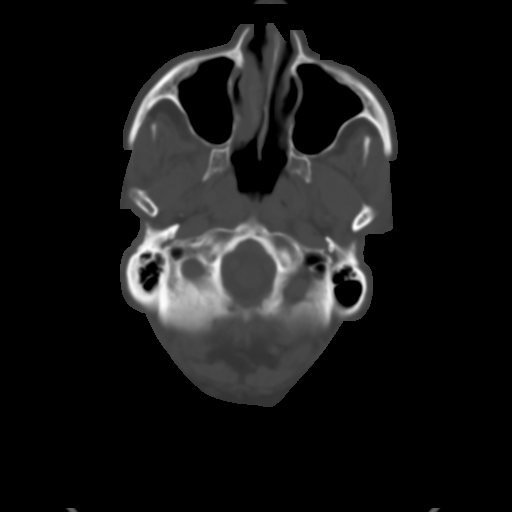
[im 6/34  brain]
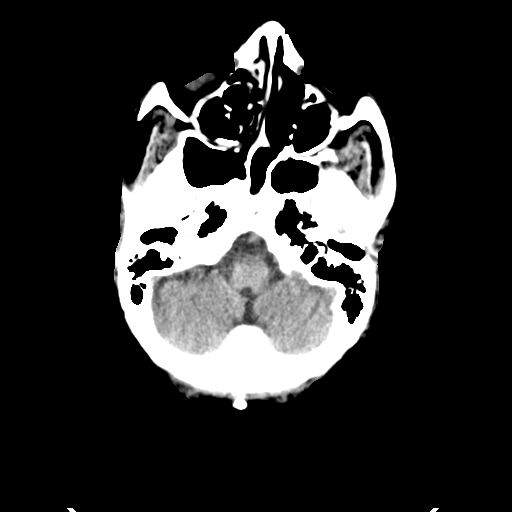
[im 10/34  brain]
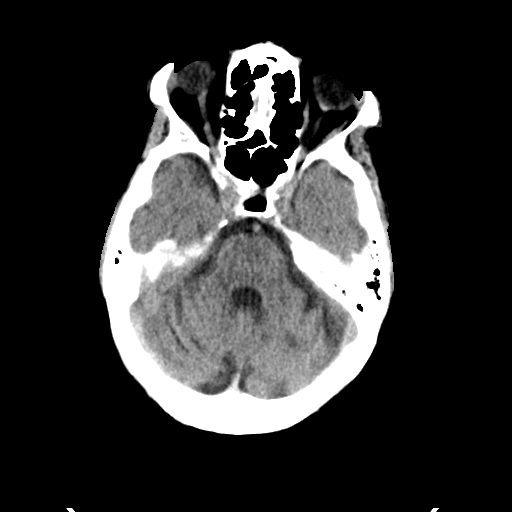
[im 13/34  brain]
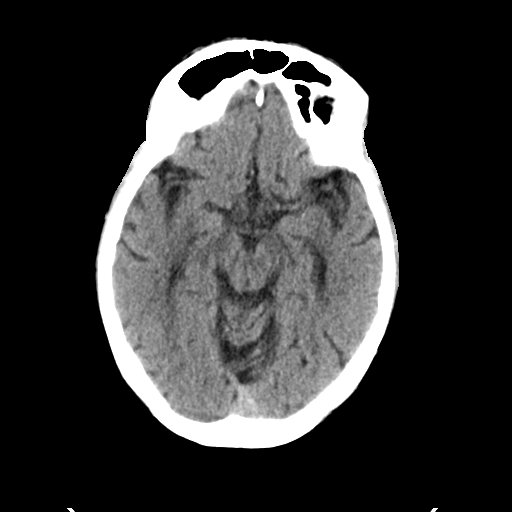
[im 18/34  brain]
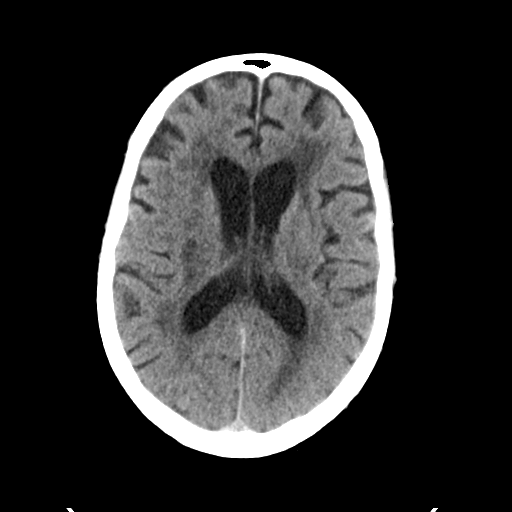
[im 18/34  bone]
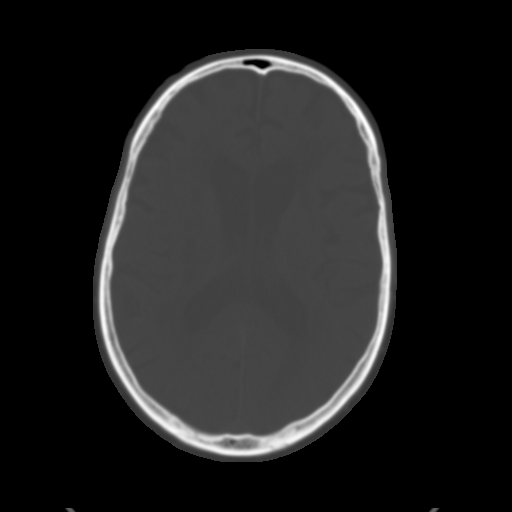
[im 21/34  brain]
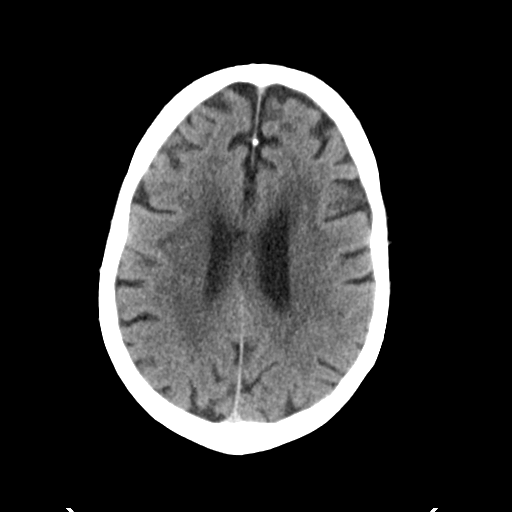
[im 24/34  brain]
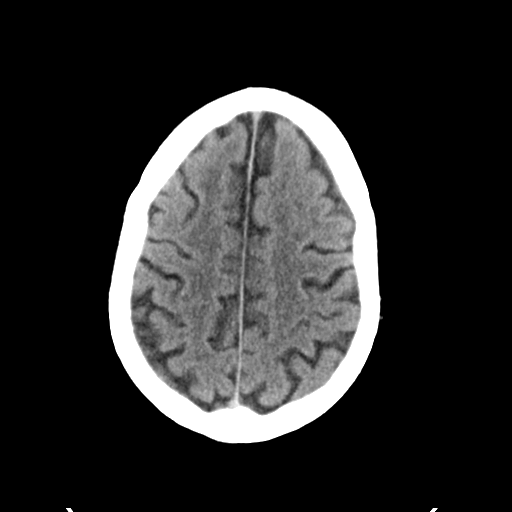
[im 28/34  brain]
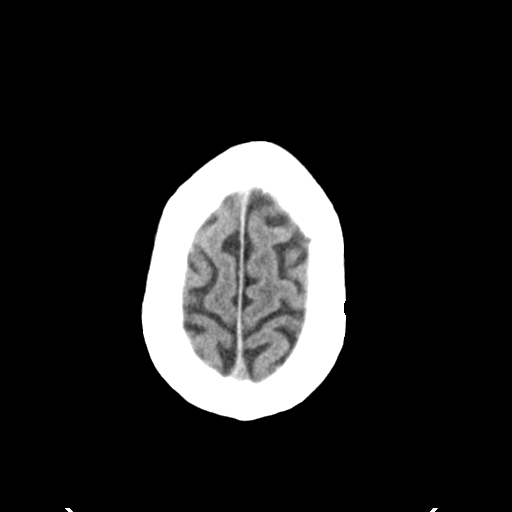
[im 31/34  brain]
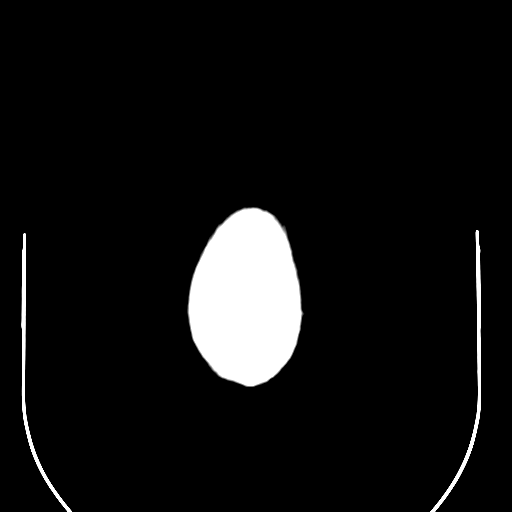
[im 31/34  bone]
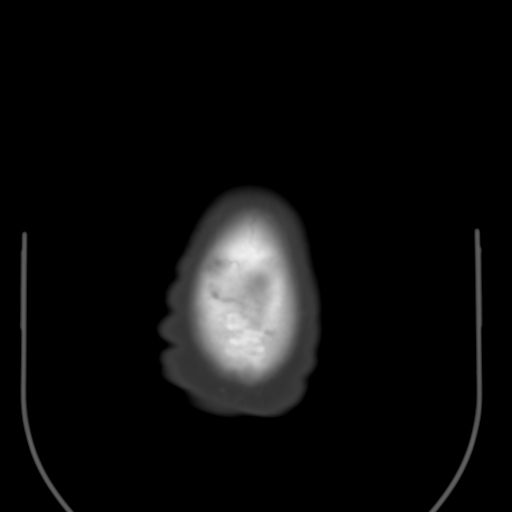

[Series 5: head 3.0 mpr cor · coronal · 0.33mm/px · 3 of 78 slices shown]
[im 26/78  brain]
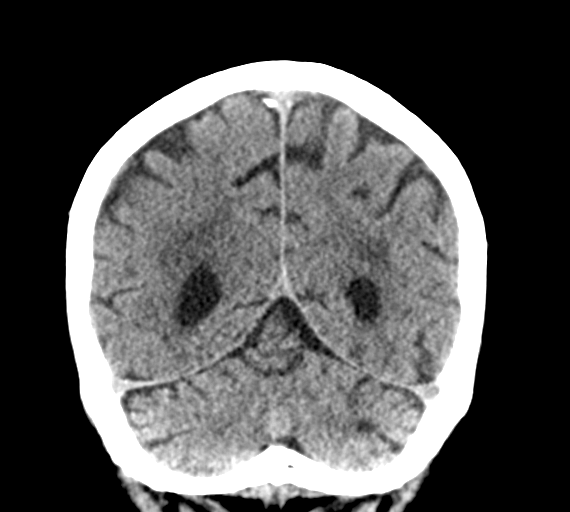
[im 35/78  brain]
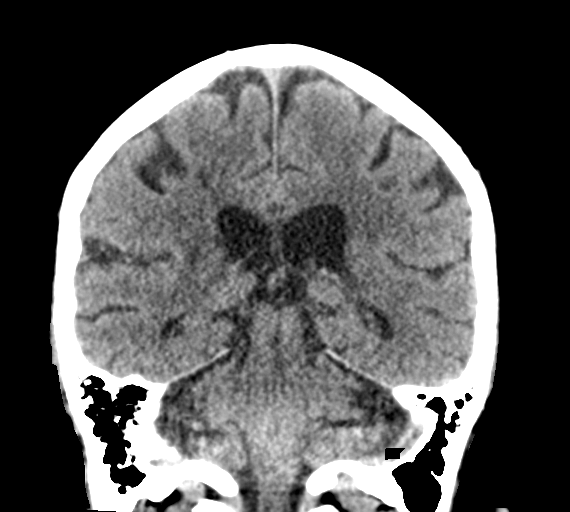
[im 43/78  brain]
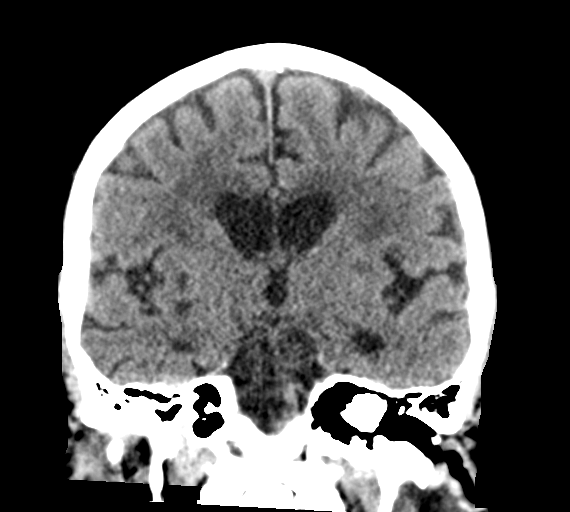

[Series 6: head 3.0 mpr sag · sagittal · 0.33mm/px · 3 of 58 slices shown]
[im 20/58  brain]
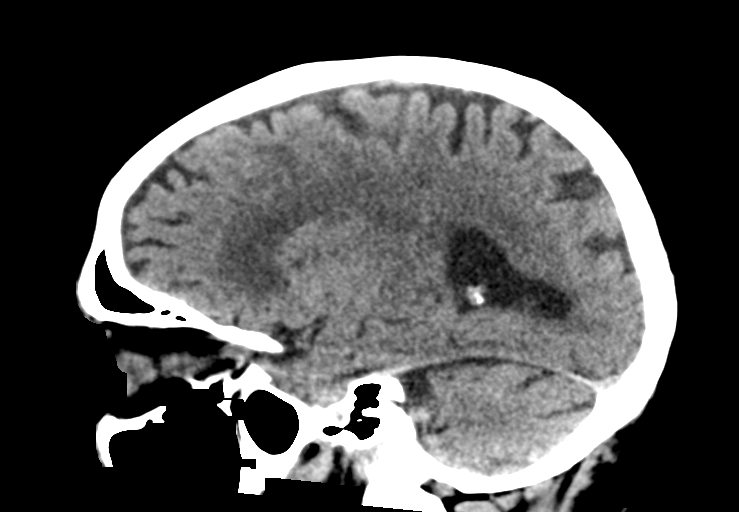
[im 29/58  brain]
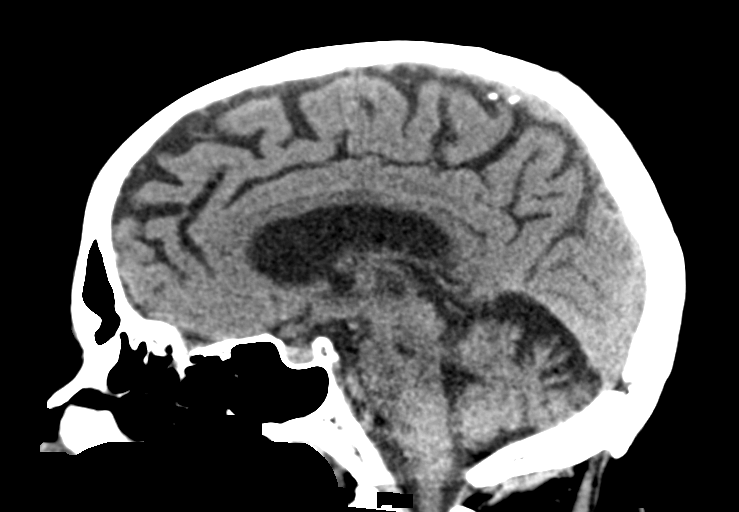
[im 39/58  brain]
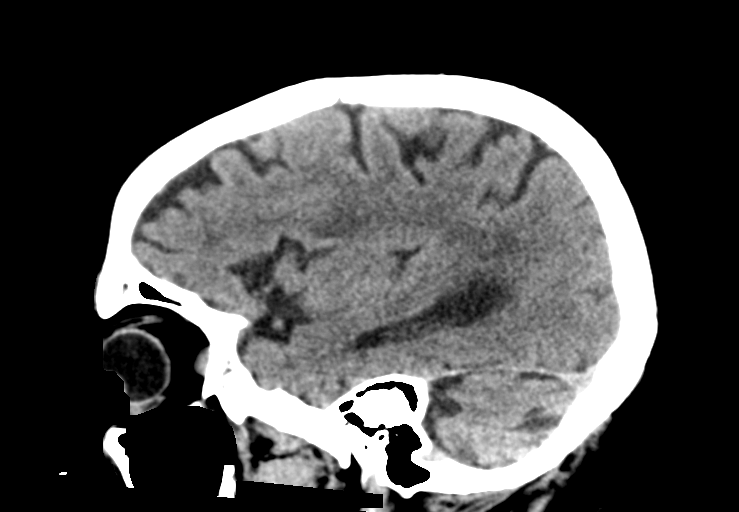

[15 of 47 positions shown; findings below may reference images not displayed]

FINDINGS: Brain: There is no mass, hemorrhage or extra-axial collection. The
size and configuration of the ventricles and extra-axial CSF spaces
are normal. There is hypoattenuation of the white matter, most
commonly indicating chronic small vessel disease. Unchanged
appearance of multiple recent infarcts of the right basal ganglia
and centrum semiovale.

Vascular: No abnormal hyperdensity of the major intracranial
arteries or dural venous sinuses. No intracranial atherosclerosis.

Skull: The visualized skull base, calvarium and extracranial soft
tissues are normal.

Sinuses/Orbits: No fluid levels or advanced mucosal thickening of
the visualized paranasal sinuses. No mastoid or middle ear effusion.
The orbits are normal.
IMPRESSION: Unchanged appearance of multiple recent infarcts of the right basal
ganglia and centrum semiovale. No hemorrhage or mass effect.

## 2019-07-22 MED ORDER — SODIUM CHLORIDE 0.9 % IV SOLN
2.0000 g | Freq: Two times a day (BID) | INTRAVENOUS | Status: DC
  Administered 2019-07-22 – 2019-07-25 (×6): 2 g via INTRAVENOUS
  Filled 2019-07-22 (×7): qty 2

## 2019-07-22 MED ORDER — VANCOMYCIN HCL 1250 MG/250ML IV SOLN
1250.0000 mg | Freq: Once | INTRAVENOUS | Status: AC
  Administered 2019-07-22: 1250 mg via INTRAVENOUS
  Filled 2019-07-22: qty 250

## 2019-07-22 MED ORDER — SODIUM CHLORIDE 0.9 % IV SOLN: INTRAVENOUS | Status: DC

## 2019-07-22 MED ORDER — VANCOMYCIN HCL 750 MG/150ML IV SOLN
750.0000 mg | INTRAVENOUS | Status: DC
  Administered 2019-07-23: 750 mg via INTRAVENOUS
  Filled 2019-07-22: qty 150

## 2019-07-22 NOTE — Patient Care Conference (Signed)
Inpatient RehabilitationTeam Conference and Plan of Care Update Date: 07/22/2019   Time: 4:21 PM    Patient Name: Russell Russell      Medical Record Number: [id number]  Date of Birth: 1951/02/17 Sex: Male         Room/Bed: 4M01C/4M01C-01 Payor Info: Payor: MEDICARE / Plan: MEDICARE PART A AND B / Product Type: *No Product type* /    Admit Date/Time:  07/07/2019  3:39 PM  Primary Diagnosis:  Cerebrovascular accident (CVA) of right basal ganglia [facility])  Patient Active Problem List   Diagnosis Date Noted  . Fever   . Sepsis without acute organ dysfunction ([place])   . Elevated BUN   . Blood pressure increase diastolic   . Labile blood pressure   . Leukopenia   . Benign essential HTN   . Hypoalbuminemia   . Cerebrovascular accident (CVA) of right basal ganglia ([place]) 07/07/2019  . Acute ischemic stroke ([place])   . Alcohol abuse   . Dyslipidemia   . Dysphagia, post-stroke   . Polycythemia   . Stroke ([place]) 06/30/2019  . Prediabetes 12/23/2016  . Elevated LFTs 12/23/2016  . Pure hypercholesterolemia 12/21/2016  . Alcoholism ([place]) 12/21/2016  . Eczema 09/18/2013  . Essential hypertension 09/18/2013  . Gastroesophageal reflux disease without esophagitis 09/18/2013  . Tobacco abuse 09/18/2013    Expected Discharge Date: Expected Discharge Date: 08/05/19  Team Members Present: Physician leading conference: Dr. Delice Lesch Care Coodinator Present: Nestor Lewandowsky, RN, BSN, CRRN;Christina Sampson Goon, [place] Nurse Present: Mohammed Kindle, RN PT Present: Becky Sax, PT OT Present: [name], OT SLP Present: Weston Anna, SLP;Madison Cratch, SLP PPS Coordinator present : Ileana Ladd, [name], SLP     Current Status/Progress Goal Weekly Team Focus  Bowel/Bladder   patient is incontinent of bladder and continent of bowel. LBM 07/21/19  offer toileting while awake  q 2-3 hours and PRN toileting   Swallow/Nutrition/ Hydration   dys 3 and thin supervision A , Full supervision  A  Supervision  carryover of swallow startegies and trials of regular   ADL's   mod assist bathing and dressing, Mod assist squat pivot transfers, sit > stand mod assist - increased pushing to Lt in sitting and standing especially 5/11  Min assist overall  ADL retraining, transfers, trunk control in sitting and standing, LUE NMR   Mobility   bed mobility mod A, squa<>pivot transfers mod A +2 (safety), sit<>stand max +2 due to recent bladder infection and fever  min A  functional mobility/transfers, LE strength, NMR, motor control, dynamic sitting/standing balance, gait training, endurance   Communication   Min A - 80-85% intelligibility at sentence level  Mod I  RMT to increase vocal intensity and carryover of speech intelligibility strategies   Safety/Cognition/ Behavioral Observations  Min-Max A         Pain   no c/o pain  remain pain free  assess qs and prn   Skin   eczema RLE  reamin free of new skin breakdown/infection  assess skin q shift and PRN    Rehab Goals Patient on target to meet rehab goals: Yes Rehab Goals Revised: Patient on target with goals currently *See Care Plan and progress notes for long and short-term goals.     Barriers to Discharge  Current Status/Progress Possible Resolutions Date Resolved   Nursing                  PT  Other (comments)  L pushing; recent bladder infection and fever  OT                  SLP                SW   None SW aware of currently On target with goals          Discharge Planning/Teaching Needs:  Goal to to discharge home with spouse  Will schedule iff recommended   Team Discussion:  FUO urine source suspect = ABX, IV fluids stopped, pushing left and trouble following instructions, inconsistent awareness of deficits and minimal assist goals  Revisions to Treatment Plan:  Full supervision for meals due to cognition issues    Medical Summary Current Status: Left side weakness secondary to multiple scattered  anterior and posterior right brain infarcts Weekly Focus/Goal: Improved mobility, dysphagia, leukocytosis, sepsis  Barriers to Discharge: Decreased family/caregiver support;Medical stability;Incontinence   Possible Resolutions to Barriers: Therapies, follow labs, continue to advance diet as tolerated, optimize BP meds, cont abx   Continued Need for Acute Rehabilitation Level of Care: The patient requires daily medical management by a physician with specialized training in physical medicine and rehabilitation for the following reasons: Direction of a multidisciplinary physical rehabilitation program to maximize functional independence : Yes Medical management of patient stability for increased activity during participation in an intensive rehabilitation regime.: Yes Analysis of laboratory values and/or radiology reports with any subsequent need for medication adjustment and/or medical intervention. : Yes   I attest that I was present, lead the team conference, and concur with the assessment and plan of the team.   Dorien Chihuahua B 07/22/2019, 4:21 PM

## 2019-07-22 NOTE — Progress Notes (Signed)
[facility] PHYSICAL MEDICINE & REHABILITATION PROGRESS NOTE  Subjective/Complaints: Patient seen sitting up in his chair this AM.  He states he slept well overnight.  He states he feels better.   ROS: Denies CP, SOB, N/V/D  Objective: Vital Signs: Blood pressure (!) 125/91, pulse 96, temperature 99.3 F (37.4 C), temperature source Oral, resp. rate 16, height 5\' 10"  (1.778 m), weight 58.3 kg, SpO2 99 %. DG Chest 2 View  Result Date: 07/20/2019 CLINICAL DATA:  Fevers EXAM: CHEST - 2 VIEW COMPARISON:  None. FINDINGS: Cardiac shadows within normal limits. The lungs are well aerated bilaterally. No focal infiltrate or sizable effusion is noted. No bony abnormality is seen. Loop recorder is noted on the left. IMPRESSION: No active cardiopulmonary disease. Electronically Signed   By: Inez Catalina M.D.   On: 07/20/2019 22:31   Recent Labs    07/20/19 2112 07/22/19 0641  WBC 12.5* 10.6*  HGB 15.7 14.2  HCT 45.6 41.7  PLT 245 231   Recent Labs    07/20/19 2112 07/22/19 0641  NA 137 137  K 4.7 4.1  CL 105 104  CO2 23 24  GLUCOSE 132* 105*  BUN 27* 15  CREATININE 1.18 1.29*  CALCIUM 9.9 8.6*    Physical Exam: BP (!) 125/91 (BP Location: Right Arm)   Pulse 96   Temp 99.3 F (37.4 C) (Oral)   Resp 16   Ht 5\' 10"  (1.778 m)   Wt 58.3 kg   SpO2 99%   BMI 18.44 kg/m  Constitutional: No distress . Vital signs reviewed. HENT: Normocephalic.  Atraumatic. Eyes: EOMI. No discharge. Cardiovascular: No JVD. Respiratory: Normal effort.  No stridor. GI: Non-distended. Skin: Warm and dry.  Intact. Psych: Normal mood.  Normal behavior. Musc: No edema in extremities.  No tenderness in extremities. Neuro: Alert Motor: Left upper extremity: Shoulder abduction 4/5, distally 4/5, with apraxia and ataxia Left lower extremity: Hip flexion 3-/5, +knee extension 3-/5, ankle dorsiflexion 4/5 Dysarthria, stable Left facial weakness, stable.   Assessment/Plan: 1. Functional deficits  secondary to multiple anterior and posterior right brain infarcts which require 3+ hours per day of interdisciplinary therapy in a comprehensive inpatient rehab setting.  Physiatrist is providing close team supervision and 24 hour management of active medical problems listed below.  Physiatrist and rehab team continue to assess barriers to discharge/monitor patient progress toward functional and medical goals  Care Tool:  Bathing    Body parts bathed by patient: Left arm, Chest, Abdomen, Front perineal area, Right upper leg, Left upper leg, Face   Body parts bathed by helper: Buttocks, Right arm     Bathing assist Assist Level: Moderate Assistance - Patient 50 - 74%     Upper Body Dressing/Undressing Upper body dressing   What is the patient wearing?: Hospital gown only    Upper body assist Assist Level: Minimal Assistance - Patient > 75%    Lower Body Dressing/Undressing Lower body dressing      What is the patient wearing?: Pants, Incontinence brief     Lower body assist Assist for lower body dressing: 2 Helpers     Toileting Toileting Toileting Activity did not occur (Clothing management and hygiene only): N/A (no void or bm)  Toileting assist Assist for toileting: 2 Helpers     Transfers Chair/bed transfer  Transfers assist     Chair/bed transfer assist level: Moderate Assistance - Patient 50 - 74%     Locomotion Ambulation   Ambulation assist   Ambulation activity did  not occur: Safety/medical concerns(L hemi, decreased postural control/balance, fatigue)  Assist level: 2 helpers Assistive device: Other (comment)(wall on R side without rail) Max distance: 5 ft   Walk 10 feet activity   Assist  Walk 10 feet activity did not occur: Safety/medical concerns(L hemi, decreased postural control/balance, fatigue)  Assist level: 2 helpers Assistive device: Lite Gait   Walk 50 feet activity   Assist Walk 50 feet with 2 turns activity did not occur:  Safety/medical concerns(L hemi, decreased postural control/balance, fatigue)         Walk 150 feet activity   Assist Walk 150 feet activity did not occur: Safety/medical concerns(L hemi, decreased postural control/balance, fatigue)         Walk 10 feet on uneven surface  activity   Assist Walk 10 feet on uneven surfaces activity did not occur: Safety/medical concerns(L hemi, decreased postural control/balance, fatigue)         Wheelchair     Assist Will patient use wheelchair at discharge?: Yes Type of Wheelchair: Manual    Wheelchair assist level: Dependent - Patient 0%      Wheelchair 50 feet with 2 turns activity    Assist        Assist Level: Dependent - Patient 0%   Wheelchair 150 feet activity     Assist     Assist Level: Dependent - Patient 0%      Medical Problem List and Plan: 1.  Left side weakness secondary to multiple scattered anterior and posterior right brain infarcts.  Status post loop recorder  Continue CIR  Team conference today to discuss current and goals and coordination of care, home and environmental barriers, and discharge planning with nursing, case manager, and therapies.  2.  Antithrombotics: -DVT/anticoagulation: Lovenox             -antiplatelet therapy: Aspirin 325 mg daily and Plavix 75 mg daily x3 months then aspirin alone 3. Pain Management: Tylenol as needed 4. Mood: Provide emotional support             -antipsychotic agents: N/A 5. Neuropsych: This patient is ?fully capable of making decisions on his own behalf. 6. Skin/Wound Care: Routine skin checks 7. Fluids/Electrolytes/Nutrition: Routine in and outs. 8.  Hypertension.  Lopressor 12.5 mg twice daily, increased to 25 on 5/8.    Lisinopril 2.5 qhs started on 5/5  Slightly labile on 5/12  Monitor with increased mobility 9.  Hyperlipidemia. Lipitor 10.  Dysphagia.  Dysphagia #3 thins liquids.  Follow-up speech therapy  Cont to advance diet as  tolerated 11.  History of tobacco and alcohol use.  NicoDerm patch.  Provide counseling.    Monitor for withdrawal 12.  Moderate hypoalbuminemia  Supplement initiated on 4/28 13. Leukopenia, now with leukocytosis  WBCs 10.6 on 5/12, labs ordered for tomorrow  Cont to monitor 13.  Elevated BUN  Echocardiogram reviewed, EF ~60%  Resolved with IVF, d/ced on 5/12 14.  Sepsis-likely urosepsis  See #12  Febrile overnight, with low-grade temp this morning  UA suspicious, urine culture multiple species  Blood cultures NGTD  Chest x-ray personally reviewed, unremarkable for infection  Cont Empiric Septra   LOS: 15 days A FACE TO FACE EVALUATION WAS PERFORMED  Kerman Pfost Lorie Phenix 07/22/2019, 8:39 AM

## 2019-07-22 NOTE — Progress Notes (Signed)
Occupational Therapy Weekly Progress Note  Patient Details  Name: Russell Russell MRN: [id number] Date of Birth: Feb 05, 1951  Beginning of progress report period: Jul 15, 2019 End of progress report period: Jul 22, 2019  Today's Date: 07/22/2019 OT Individual Time: 2836-6294 OT Individual Time Calculation (min): 55 min    Patient has met 2 of 3 short term goals.  Pt is making steady progress towards goals with exception of the past 2 days, as pt now with UTI impacting his cognition and increased his pushing tendencies in sitting, standing, and during transfers.  Pt requires mod-max assist for sitting balance and transfers.  Pt requires up to max assist for standing balance due to increased pushing to Lt during dynamic standing/reaching tasks. Pt currently requires increased assistance for LB dressing due to increase in pushing in dynamic sitting and standing.  Pt has demonstrated carryover of education and is able to achieve midline, however with UTI pt pushing has become exacerbated.  Pt will continue to benefit from OT services to continue to address impairments during self-care tasks and functional mobility.  Patient continues to demonstrate the following deficits: muscle weakness, decreased cardiorespiratoy endurance, impaired timing and sequencing, abnormal tone, unbalanced muscle activation, motor apraxia, decreased coordination and decreased motor planning, decreased visual perceptual skills, decreased midline orientation, decreased attention to left and decreased motor planning, decreased attention, decreased awareness, decreased problem solving, decreased safety awareness and decreased memory and decreased sitting balance, decreased standing balance, decreased postural control, hemiplegia and decreased balance strategies and therefore will continue to benefit from skilled OT intervention to enhance overall performance with BADL and Reduce care partner burden.  Patient progressing toward long  term goals..  Continue plan of care.  OT Short Term Goals Week 2:  OT Short Term Goal 1 (Week 2): Pt will complete bathing at sit > stand with mod assist OT Short Term Goal 1 - Progress (Week 2): Met OT Short Term Goal 2 (Week 2): Pt will don pants with mod assist sit > stand OT Short Term Goal 2 - Progress (Week 2): Progressing toward goal OT Short Term Goal 3 (Week 2): Pt will complete toilet transfers mod assist 3 out of 5 times to demonstate consistency OT Short Term Goal 3 - Progress (Week 2): Met Week 3:  OT Short Term Goal 1 (Week 3): Pt will don pants with mod assist sit > stand OT Short Term Goal 2 (Week 3): Pt will complete sit > stand with min assist during self-care tasks OT Short Term Goal 3 (Week 3): Pt will complete UB dressing with min assist OT Short Term Goal 4 (Week 3): Pt will complete toilet transfers with mod assist to Rt and Lt  Skilled Therapeutic Interventions/Progress Updates:    Treatment session with focus on functional transfers and engagement in self-care retraining with bathing and dressing. Pt received supine in bed agreeable to bathing/dressing at sink (due to running IV) prior to eating breakfast.  Completed bed mobility with mod assist and multimodal cues for sequencing of bed mobility.  Squat pivot transfer to Rt with mod assist, therapist positioned over the back/Bobath technique to decrease pushing and facilitate weight shift.  Engaged in bathing and dressing at sink with hand over hand assist to incorporate LUE in to bathing.  Pt with frequent dropping of washcloth due to inattention and decreased proprioception and awareness.  Max assist sit > stand with increased pushing Lt in standing with decreased ability to correct despite facilitation and use of mirror for feedback.  Pt required +2 for LB dressing due to increased pushing.  Engaged in self-feeding with cues to visually scan entire tray to locate items.  Pt only eating ~2 bites before reporting no longer  hungry.  Pt remained upright in w/c with seat belt alarm on and all needs in reach.  Therapy Documentation Precautions:  Precautions Precautions: Fall, Other (comment) Precaution Comments: permissive HTN initially (<220/120) then normotension, L sided weakness, aspiration precautions (dysphagia 2 with thins), ; L hemiparesis Restrictions Weight Bearing Restrictions: No General:   Vital Signs: Therapy Vitals Temp: 99.3 F (37.4 C) Temp Source: Oral Pulse Rate: 96 Resp: 16 BP: (!) 125/91 Patient Position (if appropriate): Lying Oxygen Therapy SpO2: 99 % O2 Device: Room Air Pain:  Pt with no c/o pain   Therapy/Group: Individual Therapy  [name] 07/22/2019, 8:27 AM

## 2019-07-22 NOTE — Progress Notes (Signed)
Occupational Therapy Session Note  Patient Details  Name: Russell Russell MRN: [id number] Date of Birth: [birth year]  Today's Date: 07/22/2019 OT Individual Time: 1500-1530 OT Individual Time Calculation (min): 30 min    Short Term Goals: Week 1:  OT Short Term Goal 1 (Week 1): Pt will transfer to drop arm BSC with mod A +2 with LRAD/ method OT Short Term Goal 1 - Progress (Week 1): Met OT Short Term Goal 2 (Week 1): Pt will perform sit to stand with max cues with mod A in prep for clothing management OT Short Term Goal 2 - Progress (Week 1): Met OT Short Term Goal 3 (Week 1): Pt will be able to maintain static sitting balance in prep for ADL tasks with consistant mod A OT Short Term Goal 3 - Progress (Week 1): Met OT Short Term Goal 4 (Week 1): Pt will incorporate left UE in functional tasks with minimal cuing OT Short Term Goal 4 - Progress (Week 1): Met  Skilled Therapeutic Interventions/Progress Updates:    1:1. Pt received in TIS agreeable to OT tx with no pain. Focus of session on sitting balance and midline orientation. Pt completes squat pivot transfer to/from EOB with MOD A of 2 overall and blocked L knee. Pt completes static sitting balance seated EOM holding for 10 second intervals with mod VC with mirror used for visual feedback. Pt completes seated reaching task with up to MOD A overall for dynamic sitting balance tasks reaching for horse shoes. Exited sesison wih tpt seated in w/c, exit alarm on and call light itn reach  Therapy Documentation Precautions:  Precautions Precautions: Fall, Other (comment) Precaution Comments: permissive HTN initially (<220/120) then normotension, L sided weakness, aspiration precautions (dysphagia 2 with thins), ; L hemiparesis Restrictions Weight Bearing Restrictions: No General:   Vital Signs: Therapy Vitals Temp: 98.8 F (37.1 C) Temp Source: Oral Pulse Rate: (!) 105 Resp: (!) 25(Dan, PA made aware. will encourage deep  breathing) BP: 121/80 Patient Position (if appropriate): Lying Oxygen Therapy SpO2: 100 % O2 Device: Room Air Pain: Pain Assessment Pain Score: 0-No pain ADL: ADL Upper Body Bathing: Minimal assistance Where Assessed-Upper Body Bathing: Sitting at sink Lower Body Bathing: Maximal assistance Where Assessed-Lower Body Bathing: Sitting at sink Upper Body Dressing: Maximal assistance Where Assessed-Upper Body Dressing: Sitting at sink Lower Body Dressing: Maximal assistance Where Assessed-Lower Body Dressing: Sitting at sink, Standing at sink Toileting: Not assessed Toilet Transfer: Not assessed Vision   Perception    Praxis   Exercises:   Other Treatments:     Therapy/Group: Individual Therapy  Tonny Branch 07/22/2019, 3:30 PM

## 2019-07-22 NOTE — Progress Notes (Signed)
Recreational Therapy Session Note  Patient Details  Name: Russell Russell MRN: [id number] Date of Birth: 10/23/1950 Today's Date: 07/22/2019 Time:  [id number] Pain: no c/o Skilled Therapeutic Interventions/Progress Updates: Session focused on bed mobility,stand pivot transfers, dynamic sitting balance, awareness of midline during co-treat with PT.  Pt in bed upon arrival needing moderate cues/encouragment to participate in therapy.  Assisted pt in changing his brief as he was wet.  Pt performed rolling with instructional cues - up to min assist to remove brief and then to apply new one and clean pants.  Pt sat EOB with mod-max assist for balance to don a shirt and his shoes.  Pt performed stand pivot transfers with max -total assist +2 for safety throughout the session.  Once in the therapy gym, pt sat EOM with mod-total assist for static sitting balance.   PT transitioned to sitting on a physio ball to support pt from behind during a leisure task.  Pt sat EOM participating in a modified fishing activity with max assist fading to close supervision  using modified fishing pole to catch fish.  Pt laughing and joking some during session.  Pt required instructional, visual (use of mirror), and tactile cues throughout the session. Therapy/Group: Co-Treatment  Kolette Vey 07/22/2019, 3:18 PM

## 2019-07-22 NOTE — Progress Notes (Signed)
Speech Language Pathology Weekly Progress and Session Note  Patient Details  Name: Russell Russell MRN: [id number] Date of Birth: [birth year]  Beginning of progress report period: Jul 15, 2019 End of progress report period: Jul 22, 2019  Today's Date: 07/22/2019 SLP Individual Time: 6283-6629 SLP Individual Time Calculation (min): 46 min  Short Term Goals: Week 2: SLP Short Term Goal 1 (Week 2): Patient will utilize speech intelligibility strategies with Min verbal cues to achieve ~90% intelligibility at the sentence level. SLP Short Term Goal 1 - Progress (Week 2): Met SLP Short Term Goal 2 (Week 2): Pt will demonstrate efficient mastication and oral clearance of upgraded dysphagia 3 texture diet with Supervision A verbal cues for use of swallow strategies for oral clarance and to minimize anterior loss SLP Short Term Goal 2 - Progress (Week 2): Met SLP Short Term Goal 3 (Week 2): Patient will name 2 physical and 2 cognitive changes with Mod verbal cues. SLP Short Term Goal 3 - Progress (Week 2): Not met SLP Short Term Goal 4 (Week 2): Patient will demonstrate functional problem solving for basic and familiar tasks with Min verbal cues. SLP Short Term Goal 4 - Progress (Week 2): Not met SLP Short Term Goal 5 (Week 2): Patient will utilize external aids for recall of functional information with Mod verbal and visual cues. SLP Short Term Goal 5 - Progress (Week 2): Met    New Short Term Goals: Week 3: SLP Short Term Goal 1 (Week 3): Patient will utilize speech intelligibility strategies with supervision verbal cues to achieve ~90% intelligibility at the sentence level. SLP Short Term Goal 2 (Week 3): Pt will demonstrate efficient mastication and oral clearance of upgraded dysphagia 3 texture diet Mod I for use of swallow strategies for oral clarance and to minimize anterior loss SLP Short Term Goal 3 (Week 3): Patient will name 2 physical and 2 cognitive changes with Mod verbal cues. SLP  Short Term Goal 4 (Week 3): Patient will demonstrate functional problem solving for basic and familiar tasks with Min verbal cues. SLP Short Term Goal 5 (Week 3): Patient will utilize external aids for recall of functional information with Min verbal and visual cues.  Weekly Progress Updates: Pt made moderate progress meeting 3 out 5 goals this reporting period. Pt demonstrated increase ability to appropriately masticate and clear oral cavity with supervision A verbal cues of dys 3 textures, remaining currently on full supervision (liquids allowed without supervision) due to inconsistent awareness of deficits. SLP began trials of regular textures and will hope to advance to intermittent supervision next reporting period with increase consistent cognitive changes. SLP recommends to continue EMST at 15cm H2O due to increase in vocal intensity noted and self rating score of 7/10 effortful level. Pt continues to demonstrate inconsistent cognitive skills in basic problem solving, intellectual/emergent awareness and recall of novel information. SLP was continue to focus on cognitive skills, since majority of skilled St treatment sessions were focused on dysphagia and speech intelligibility deficits this reporting period. Pt would benefit from skills ST services in order to maximumize functional indepdence and reduce burden of care, requring 24 hour supervision and continued ST services.     Intensity: Minumum of 1-2 x/day, 30 to 90 minutes Frequency: 3 to 5 out of 7 days Duration/Length of Stay: 5/26 Treatment/Interventions: Cognitive remediation/compensation;Dysphagia/aspiration precaution training;Internal/external aids;Speech/Language facilitation;Therapeutic Activities;Cueing hierarchy;Environmental controls;Functional tasks;Patient/family education   Daily Session  Skilled Therapeutic Interventions:  Skilled ST services focused on swallow and cognitive skills. SLP facilitated basic  problem solving and  recall skills utilizing basic money management (ALFA) pt demonstrated ability to display requested change with max A verbal cues for problem solving, with no error awareness and min A verbal cues for recall with aid. SLP also facilitated identification of acute deficits, pt identified 2 physical deficits ( left leg weakness and difficulty walking, denies left arm weakness) and denies cognitive deficits, however given max A verbal cues/education following money management task pt agreed to cognitive changes. Pt demonstrated recall of EMST repetitions/times per day with min A verbal cues and completed x25 set at 15 cm H2O and self rating scored of 7/10. Pt continues to demonstrate inconsistent awareness of deficits, stating left side weakness throughout session, however when questioned about PO consumption pt consistently expressed left side was his strong side and where he should place food. SLP provided education on left side weakness due to right CVA utilizing biofeedback (his phone camera) pt agreed to changes in facial muscles (facial drop with smiling) and to place food on right side during PO consumption. Pt consumed limited regular texture trial, in which pt demonstrated appropriate placement of bolus and oral clearance.SLP recommends to continue full supervision due to cognitive deficits with independence on liquids, notifing NT.Pt was left in room with call bell within reach and bed alarm set. ST recommends to continue skilled ST services.   General    Pain Pain Assessment Pain Score: 0-No pain  Therapy/Group: Individual Therapy  Vishnu Moeller  [facility] 07/22/2019, 1:04 PM

## 2019-07-22 NOTE — Progress Notes (Signed)
   07/22/19 1621  Assess: MEWS Score  Temp (!) 102.5 F (39.2 C)  BP 128/88  Pulse Rate 99  Resp 20  Level of Consciousness Alert  SpO2 100 %  O2 Device Room Air  Assess: MEWS Score  MEWS Temp 2  MEWS Systolic 0  MEWS Pulse 0  MEWS RR 0  MEWS LOC 0  MEWS Score 2  MEWS Score Color Yellow  Assess: if the MEWS score is Yellow or Red  Were vital signs taken at a resting state? Yes  Early Detection of Sepsis Score *See Row Information* Medium  MEWS guidelines implemented *See Row Information* No, previously yellow, continue vital signs every 4 hours  Treat  MEWS Interventions Escalated (See documentation below)  Take Vital Signs  Increase Vital Sign Frequency  Yellow: Q 2hr X 2 then Q 4hr X 2, if remains yellow, continue Q 4hrs  Escalate  MEWS: Escalate Yellow: discuss with charge nurse/RN and consider discussing with provider and RRT (discussed with provider, Pam, PA)  Notify: Charge Nurse/RN  Name of Charge Nurse/RN Notified Verdis Frederickson  Date Charge Nurse/RN Notified 07/22/19  Time Charge Nurse/RN Notified 1730  Notify: Provider  Provider Name/Title Algis Liming, PA  Date Provider Notified 07/22/19  Time Provider Notified 1640  Notification Type Face-to-face  Notification Reason Other (Comment) (MEWs yellow for Temp of 102.5)  Response See new orders  Date of Provider Response 07/22/19  Time of Provider Response 1640  Document  Patient Outcome Other (Comment) (at baseline. wife at bedside. )   Yellow MEWs at 1621 due to temp of 102.5. HR ranging between 99-108. Pt was shivering and stated he is cold. Pam PA made aware. New orders placed. Pt currently resting in bed with IV fluids and Cefepime running. Pt to be transported to CT soon.   Gerald Stabs, RN

## 2019-07-22 NOTE — Progress Notes (Signed)
Patient reported to have chills earlier this afternoon--was afebrile then. Now febrile with T-102.5. No cough, SOB, abdominal pain or problems with voiding. Has been on Septra DS- urine culture with multi species and WBC on downward trend. Procalcitonin 0.79 and slowly trending up. Will pan culture again and broaden antibiotic coverage.   Patient and wife have not had Flu or Covid vaccine. Patient had PCN when he was 19 and had rash. Neither are aware of having any other antibiotics or any reactions. He

## 2019-07-22 NOTE — Progress Notes (Signed)
Physical Therapy Session Note  Patient Details  Name: Russell Russell MRN: [id number] Date of Birth: November 16, 1950  Today's Date: 07/22/2019 PT Individual Time: 9476-5465 and 1400-1453 PT Individual Time Calculation (min): 29 min and 53 min  Short Term Goals: Week 2:  PT Short Term Goal 1 (Week 2): pt will transfer bed<>chair min A PT Short Term Goal 1 - Progress (Week 2): Met PT Short Term Goal 2 (Week 2): pt will transfer sit<>stand CGA PT Short Term Goal 2 - Progress (Week 2): Progressing toward goal PT Short Term Goal 3 (Week 2): Pt will ambulate 45f with LRAD max A of 1 PT Short Term Goal 3 - Progress (Week 2): Progressing toward goal Week 3:  PT Short Term Goal 1 (Week 3): pt will transfer sit<>stand CGA PT Short Term Goal 2 (Week 3): Pt will ambulate 222fwith LRAD max A of 1 PT Short Term Goal 3 (Week 3): Pt will perform WC mobility [id number] Therapeutic Interventions/Progress Updates:   Treatment Session 1: 0945-1014 29 min Received pt supine in bed, pt agreeable to therapy, and denied any pain during session. Session focused on functional mobility/transfers, LE strength, dynamic sitting/standing balance/coordination, midline orientation, simulated car transfers, and improved activity tolerance. Pt transferred supine<>sitting EOB with min A and HOB elevated. Doffed dirty gown and donned pull over shirt sitting EOB with min A. Pt required mod A fading to CGA to maintain static sitting balance EOB with verbal cues to reach for bedrail on R to correct L lateral lean. Pt transferred bed<>WC via squat<>pivot with max A. Pt with increased difficulty clearning buttocks with transfer and not scooting hips far enough to R side of WC requiring manual assist to correct. Pt transported to [place]o [place]. Pt performed simulated car transfer with max A and verbal cues to slow down, for foot placement on floor, hand placement on car, and anterior weight shifiting. Pt pushing to L  once in car, requiring max A to correct. Noted pt with urinary incontinence and soiled brief; transported back to room in TIS WC total assist. Pt transferred stand<>pivot TIS WC<>bed max A and sit<>supine with min A. Doffed soiled pants and brief with max A and donned clean brief with max A. Concluded session with pt supine in bed, needs within reach, and bed alarm on.   Treatment Session 2: [id number] min Received pt supine in bed, pt agreeable to therapy after extensive convincing, and denied any pain during session. Recreational therapy present for session. Session focused on dressing, functional mobility/transfers, LE strength, dynamic sitting balance/coordination, NMR, motor control/planning, midline orientation, and improved activity tolerance. Noted pt with soiled brief. Doffed soiled brief and performed peri-care with total assist. Donned clean brief and pants with max A while supine in bed. Pt transferred supine<>sitting EOB with min A from flat bed with use of bedrails. Doffed soiled shirt and donned clean one sitting EOB with min A but mod A to maintain static sitting balance as pt pushing hard to L today. Donned shoes sitting EOB with total assist +2 to maintain static sitting balance. Pt transferred squat<>pivot bed<>TIS WC max A +2 for safety. Pt with decreased ability to clear buttocks with transfer resulting in pt sitting halfway on WC and halfway on bed; requiring max A to scoot hips over. Pt transported to ortho gym in TIS WC total assist. Pt transferred squat<>pivot x 2 additional trials. Trial 1 max A +2 and trial 2 total assist +2 for safety.  Pt with increased difficulty with transfers today, poor ability to weight shift, and required max cues for sequencing and technique. Worked on dynamic sitting balance, L UE use, reaching outside BOS, and midline orientation going fishing using mirror for visual feedback. Pt required max A fading to CGA to maintain static sitting balance and +2 from  recreational therapy to manage fishing game. Pt with severe L lateral and anterior lean while sitting which eventually transitioned to mild R lateral and posterior lean. Pt able to correct lean when looking in mirror and sustain midline for approximately 15-20 seconds. Pt worked on crossing midline towards the R to hook fish on pole and bring them back across midline to the L to recreational therapist with emphasis on using L UE and control. Pt frequently shaking throughout session stating he was cold despite expending energy during therapy. Pt transported back to room in [facility] total assist. Concluded session with pt semi-reclined in TIS WC, needs within reach, and seatbelt alarm on.   Therapy Documentation Precautions:  Precautions Precautions: Fall, Other (comment) Precaution Comments: permissive HTN initially (<220/120) then normotension, L sided weakness, aspiration precautions (dysphagia 2 with thins), ; L hemiparesis Restrictions Weight Bearing Restrictions: No  Therapy/Group: Individual Therapy Alfonse Alpers PT, DPT   07/22/2019, 7:40 AM

## 2019-07-22 NOTE — Progress Notes (Signed)
Pharmacy Antibiotic Note  Russell Russell is a 69 y.o. male admitted to CIR on 07/07/2019 with L-sided weakness secondary to multiple scattered anterior and posterior R brain infarcts. He presented on 06/30/19 with L hemiparesis; MRI showed multiple scattered acute ischemic infarcts involving the posterior R basal ganglia, R cerebral hemisphere, and R cerebellum.  WBC has been elevated over the past couple of days (10.6K today); pt spike temp to 102.5 F today. Pharmacy has been consulted for cefepime and vancomycin dosing.  WBC 10.6, Tmax 102.5 F, Scr 1.29, CrCl 45.8 ml/min (Scr has been trending up since 07/13/19)  Plan: Cefepime 2 gm IV Q 12 hrs Vancomycin 1250 mg IV X 1, followed by vancomycin 750 mg IV Q 24 hrs Monitor WBC, temp, renal function, clinical improvement, cultures  Height: 5\' 10"  (177.8 cm) Weight: 58.3 kg (128 lb 8 oz) IBW/kg (Calculated) : 73  Temp (24hrs), Avg:99.8 F (37.7 C), Min:98.8 F (37.1 C), Max:102.5 F (39.2 C)  Recent Labs  Lab 07/17/19 0620 07/20/19 1000 07/20/19 2112 07/22/19 0641  WBC 3.5* 8.7 12.5* 10.6*  CREATININE  --   --  1.18 1.29*    Estimated Creatinine Clearance: 45.8 mL/min (A) (by C-G formula based on SCr of 1.29 mg/dL (H)).    Allergies  Allergen Reactions  . Penicillins Rash    Antimicrobials this admission: 5/10 TMP/SMX >> 5/12  Microbiology results: 5/10 BCx X 2: NG at 2 days 5/10 UCx: multiple species present/recollection suggested 4/20 COVID: negative  Thank you for allowing pharmacy to be a part of this patient's care.  Gillermina Hu, PharmD, BCPS, [facility] Clinical Pharmacist 07/22/2019 5:49 PM

## 2019-07-22 NOTE — Progress Notes (Signed)
Team Conference Report to Patient/Family  Team Conference discussion was reviewed with the patient and caregiver, including goals, any changes in plan of care and target discharge date.  Patient and caregiver express understanding and are in agreement.  The patient has a target discharge date of 08/05/19.  Dyanne Iha 07/22/2019, 2:51 PM

## 2019-07-23 ENCOUNTER — Inpatient Hospital Stay (HOSPITAL_COMMUNITY): Payer: BC Managed Care – PPO

## 2019-07-23 ENCOUNTER — Inpatient Hospital Stay (HOSPITAL_COMMUNITY): Payer: BC Managed Care – PPO | Admitting: *Deleted

## 2019-07-23 ENCOUNTER — Inpatient Hospital Stay (HOSPITAL_COMMUNITY): Payer: BC Managed Care – PPO | Admitting: Occupational Therapy

## 2019-07-23 DIAGNOSIS — N179 Acute kidney failure, unspecified: Secondary | ICD-10-CM

## 2019-07-23 LAB — CBC WITH DIFFERENTIAL/PLATELET
Abs Immature Granulocytes: 0.11 10*3/uL — ABNORMAL HIGH (ref 0.00–0.07)
Basophils Absolute: 0 10*3/uL (ref 0.0–0.1)
Basophils Relative: 1 %
Eosinophils Absolute: 0 10*3/uL (ref 0.0–0.5)
Eosinophils Relative: 0 %
HCT: 44.4 % (ref 39.0–52.0)
Hemoglobin: 15.4 g/dL (ref 13.0–17.0)
Immature Granulocytes: 2 %
Lymphocytes Relative: 17 %
Lymphs Abs: 0.9 10*3/uL (ref 0.7–4.0)
MCH: 32.1 pg (ref 26.0–34.0)
MCHC: 34.7 g/dL (ref 30.0–36.0)
MCV: 92.5 fL (ref 80.0–100.0)
Monocytes Absolute: 0.9 10*3/uL (ref 0.1–1.0)
Monocytes Relative: 17 %
Neutro Abs: 3.3 10*3/uL (ref 1.7–7.7)
Neutrophils Relative %: 63 %
Platelets: 210 10*3/uL (ref 150–400)
RBC: 4.8 MIL/uL (ref 4.22–5.81)
RDW: 16 % — ABNORMAL HIGH (ref 11.5–15.5)
WBC: 5.2 10*3/uL (ref 4.0–10.5)
nRBC: 0 % (ref 0.0–0.2)

## 2019-07-23 LAB — BASIC METABOLIC PANEL
Anion gap: 11 (ref 5–15)
BUN: 11 mg/dL (ref 8–23)
CO2: 18 mmol/L — ABNORMAL LOW (ref 22–32)
Calcium: 8.9 mg/dL (ref 8.9–10.3)
Chloride: 109 mmol/L (ref 98–111)
Creatinine, Ser: 1.25 mg/dL — ABNORMAL HIGH (ref 0.61–1.24)
GFR calc Af Amer: >60 mL/min (ref >60)
GFR calc non Af Amer: 59 mL/min — ABNORMAL LOW (ref >60)
Glucose, Bld: 106 mg/dL — ABNORMAL HIGH (ref 70–99)
Potassium: 4.1 mmol/L (ref 3.5–5.1)
Sodium: 138 mmol/L (ref 135–145)

## 2019-07-23 LAB — PROCALCITONIN: Procalcitonin: 0.56 ng/mL

## 2019-07-23 MED ORDER — CALCIUM POLYCARBOPHIL 625 MG PO TABS
1250.0000 mg | ORAL_TABLET | Freq: Two times a day (BID) | ORAL | Status: DC
  Administered 2019-07-23 – 2019-08-05 (×26): 1250 mg via ORAL
  Filled 2019-07-23 (×27): qty 2

## 2019-07-23 NOTE — Progress Notes (Signed)
Russell Russell PHYSICAL MEDICINE & REHABILITATION PROGRESS NOTE  Subjective/Complaints: Patient seen sitting up in bed this morning.  He states he slept well overnight.  He does not recall having fevers yesterday and believes that the antibiotics were from Monday.  He was noted to have fevers and work-up initiated including, labs, CT head, IV antibiotics.  Patient states he feels much better this morning.  ROS: Denies CP, SOB, N/V/D  Objective: Vital Signs: Blood pressure (!) 129/98, pulse 95, temperature 97.8 F (36.6 C), resp. rate 19, height 5\' 10"  (1.778 m), weight 58.3 kg, SpO2 99 %. DG Chest 2 View  Result Date: 07/23/2019 CLINICAL DATA:  Fever. EXAM: CHEST - 2 VIEW COMPARISON:  Jul 20, 2019 FINDINGS: There is no evidence of acute infiltrate, pleural effusion or pneumothorax. The heart size and mediastinal contours are within normal limits. A loop recorder device is again noted. The visualized skeletal structures are unremarkable. IMPRESSION: No active cardiopulmonary disease. Electronically Signed   By: Virgina Norfolk M.D.   On: 07/23/2019 00:00   CT HEAD WO CONTRAST  Result Date: 07/22/2019 CLINICAL DATA:  Fever of unknown origin. Recent infarct. EXAM: CT HEAD WITHOUT CONTRAST TECHNIQUE: Contiguous axial images were obtained from the base of the skull through the vertex without intravenous contrast. COMPARISON:  Brain MRI 07/01/2019 Head CT 06/30/2019 FINDINGS: Brain: There is no mass, hemorrhage or extra-axial collection. The size and configuration of the ventricles and extra-axial CSF spaces are normal. There is hypoattenuation of the white matter, most commonly indicating chronic small vessel disease. Unchanged appearance of multiple recent infarcts of the right basal ganglia and centrum semiovale. Vascular: No abnormal hyperdensity of the major intracranial arteries or dural venous sinuses. No intracranial atherosclerosis. Skull: The visualized skull base, calvarium and extracranial soft  tissues are normal. Sinuses/Orbits: No fluid levels or advanced mucosal thickening of the visualized paranasal sinuses. No mastoid or middle ear effusion. The orbits are normal. IMPRESSION: Unchanged appearance of multiple recent infarcts of the right basal ganglia and centrum semiovale. No hemorrhage or mass effect. Electronically Signed   By: Ulyses Jarred M.D.   On: 07/22/2019 20:06   Recent Labs    07/22/19 0641 07/22/19 1749  WBC 10.6* 7.2  HGB 14.2 14.3  HCT 41.7 42.2  PLT 231 221   Recent Labs    07/22/19 0641 07/23/19 0711  NA 137 138  K 4.1 4.1  CL 104 109  CO2 24 18*  GLUCOSE 105* 106*  BUN 15 11  CREATININE 1.29* 1.25*  CALCIUM 8.6* 8.9    Physical Exam: BP (!) 129/98 (BP Location: Left Arm)   Pulse 95   Temp 97.8 F (36.6 C)   Resp 19   Ht 5\' 10"  (1.778 m)   Wt 58.3 kg   SpO2 99%   BMI 18.44 kg/m  Constitutional: No distress . Vital signs reviewed. HENT: Normocephalic.  Atraumatic. Eyes: EOMI. No discharge. Cardiovascular: No JVD. Respiratory: Normal effort.  No stridor.  Bilaterally clear to auscultation. GI: Non-distended. Skin: Warm and dry.  Intact. Psych: Normal mood.  Normal behavior. Musc: No edema in extremities.  No tenderness in extremities. Neuro: Alert Motor: Left upper extremity: Shoulder abduction 4/5, distally 4/5, with apraxia and ataxia  Left lower extremity: Hip flexion 3-/5, +knee extension 3-/5, ankle dorsiflexion 4/5, improving Dysarthria, unchanged Left facial weakness, stable.   Assessment/Plan: 1. Functional deficits secondary to multiple anterior and posterior right brain infarcts which require 3+ hours per day of interdisciplinary therapy in a comprehensive inpatient rehab  setting.  Physiatrist is providing close team supervision and 24 hour management of active medical problems listed below.  Physiatrist and rehab team continue to assess barriers to discharge/monitor patient progress toward functional and medical  goals  Care Tool:  Bathing    Body parts bathed by patient: Left arm, Chest, Abdomen, Front perineal area, Right upper leg, Left upper leg, Face   Body parts bathed by helper: Buttocks, Right arm     Bathing assist Assist Level: Moderate Assistance - Patient 50 - 74%     Upper Body Dressing/Undressing Upper body dressing   What is the patient wearing?: Hospital gown only    Upper body assist Assist Level: Minimal Assistance - Patient > 75%    Lower Body Dressing/Undressing Lower body dressing      What is the patient wearing?: Pants, Incontinence brief     Lower body assist Assist for lower body dressing: 2 Helpers     Toileting Toileting Toileting Activity did not occur (Clothing management and hygiene only): N/A (no void or bm)  Toileting assist Assist for toileting: 2 Helpers     Transfers Chair/bed transfer  Transfers assist     Chair/bed transfer assist level: Maximal Assistance - Patient 25 - 49%     Locomotion Ambulation   Ambulation assist   Ambulation activity did not occur: Safety/medical concerns(L hemi, decreased postural control/balance, fatigue)  Assist level: 2 helpers Assistive device: Other (comment)(wall on R side without rail) Max distance: 5 ft   Walk 10 feet activity   Assist  Walk 10 feet activity did not occur: Safety/medical concerns(L hemi, decreased postural control/balance, fatigue)  Assist level: 2 helpers Assistive device: Lite Gait   Walk 50 feet activity   Assist Walk 50 feet with 2 turns activity did not occur: Safety/medical concerns(L hemi, decreased postural control/balance, fatigue)         Walk 150 feet activity   Assist Walk 150 feet activity did not occur: Safety/medical concerns(L hemi, decreased postural control/balance, fatigue)         Walk 10 feet on uneven surface  activity   Assist Walk 10 feet on uneven surfaces activity did not occur: Safety/medical concerns(L hemi, decreased  postural control/balance, fatigue)         Wheelchair     Assist Will patient use wheelchair at discharge?: Yes Type of Wheelchair: Manual    Wheelchair assist level: Dependent - Patient 0%      Wheelchair 50 feet with 2 turns activity    Assist        Assist Level: Dependent - Patient 0%   Wheelchair 150 feet activity     Assist     Assist Level: Dependent - Patient 0%      Medical Problem List and Plan: 1.  Left side weakness secondary to multiple scattered anterior and posterior right brain infarcts.  Status post loop recorder  Continue CIR  Repeat head CT personally reviewed, unchanged 2.  Antithrombotics: -DVT/anticoagulation: Lovenox             -antiplatelet therapy: Aspirin 325 mg daily and Plavix 75 mg daily x3 months then aspirin alone 3. Pain Management: Tylenol as needed 4. Mood: Provide emotional support             -antipsychotic agents: N/A 5. Neuropsych: This patient is ?fully capable of making decisions on his own behalf. 6. Skin/Wound Care: Routine skin checks 7. Fluids/Electrolytes/Nutrition: Routine in and outs. 8.  Hypertension.  Lopressor 12.5 mg twice daily, increased  to 25 on 5/8.    Lisinopril 2.5 qhs started on 5/5  Elevated diastolic pressures on [id number]  Monitor with increased mobility 9.  Hyperlipidemia. Lipitor 10.  Dysphagia.  Dysphagia #3 thins liquids.  Follow-up speech therapy  Cont to advance diet as tolerated 11.  History of tobacco and alcohol use.  NicoDerm patch.  Provide counseling.    Monitor for withdrawal 12.  Moderate hypoalbuminemia  Supplement initiated on 4/28 13. Leukopenia, now with leukocytosis  WBCs 10.6 on 5/12, labs pending, labs ordered for tomorrow  Cont to monitor 13.  AKI  Echocardiogram reviewed, EF ~60%  Creatinine 1.25 on 5/13, continue to monitor 14.  Sepsis-likely urosepsis  See #12  Febrile again  UA suspicious, urine culture multiple species; repeat UA suspicious again, urine  culture pending  Blood cultures NGTD; repeat blood cultures pending  Chest x-ray personally reviewed again, unremarkable for infection  Influenza negative  Covid negative  Empiric Septra d/ced, IV Vanco and cefepime initiated   LOS: 16 days A FACE TO FACE EVALUATION WAS PERFORMED  Russell Russell 07/23/2019, 8:23 AM

## 2019-07-23 NOTE — Progress Notes (Signed)
Patient MEWS score is yellow. Patient T 100.7 oral P 109 BP 142/105  R 18 O2 100% RA. Patient is shivering but no abdominal pain and muscle pain. Nurse administer Tylenol 650 mg due to elevated temp. VS monitoring q 4 hours continue. Will continue to monitor patient.

## 2019-07-23 NOTE — Progress Notes (Signed)
Speech Language Pathology Daily Session Note  Patient Details  Name: Russell Russell MRN: [id number] Date of Birth: Jul 28, 1950  Today's Date: 07/23/2019 SLP Individual Time: [id number] SLP Individual Time Calculation (min): 26 min  Short Term Goals: Week 3: SLP Short Term Goal 1 (Week 3): Patient will utilize speech intelligibility strategies with supervision verbal cues to achieve ~90% intelligibility at the sentence level. SLP Short Term Goal 2 (Week 3): Pt will demonstrate efficient mastication and oral clearance of upgraded dysphagia 3 texture diet Mod I for use of swallow strategies for oral clarance and to minimize anterior loss SLP Short Term Goal 3 (Week 3): Patient will name 2 physical and 2 cognitive changes with Mod verbal cues. SLP Short Term Goal 4 (Week 3): Patient will demonstrate functional problem solving for basic and familiar tasks with Min verbal cues. SLP Short Term Goal 5 (Week 3): Patient will utilize external aids for recall of functional information with Min verbal and visual cues.  Skilled Therapeutic Interventions: Skilled ST services focused on swallow and cognitive skills. Pt's wife present for treatment session. SLP facilitated PO consumption of dys 3 textured snack, pt demonstrated recall of swallow strategies, appropriate use of swallow strategies, effective mastication and oral clearance with no overt s/s aspiration. SLP reduced amount of supervision to intermittent. SLP also facilitated basic problem solving and recall in novel card task (Blink), pt required initial max a verbal cues fading to supervision A verbal cues (1/2 way through task.) Pt demonstrated over all increased cognitive skills compared to previous treatment session. Pt was left in room with wife, call bell within reach and chair alarm set. ST recommends to continue skilled ST services.      Pain Pain Assessment Pain Score: 0-No pain  Therapy/Group: Individual Therapy  Keryl Gholson   [facility] 07/23/2019, 4:53 PM

## 2019-07-23 NOTE — Progress Notes (Signed)
Pt having loose BMs today. Total of 5 BMs since this morning. Order obtained for Fibercon BID from [place], [place].   Gerald Stabs, RN

## 2019-07-23 NOTE — Progress Notes (Signed)
Recreational Therapy Session Note  Patient Details  Name: Russell Russell MRN: [id number] Date of Birth: Mar 21, 1950 Today's Date: 07/23/2019 Time: [id number] Pain: no c/o Skilled Therapeutic Interventions/Progress Updates: pt in bed with wife at bedside upon arrival.  Pt agreeable to get OOB.  Pt performed bed mobility with instructional cues and min-mod assist to check brief for incontinence.  Pt was dry.  Pt performed stand pivot transfer from bed -->w/c--> therapy mat with max assist +2 for safety.  Pt verbose today, somewhat argumentative but participatory in requested tasks given extra time.  Once in the gym, attempted standing x1 before LRT had to leave session.  Pt stood with max assist of 1, pushing noted.  Therapy/Group: Co-Treatment Stefan Karen 07/23/2019, 3:31 PM

## 2019-07-23 NOTE — Plan of Care (Signed)
  Problem: Consults Goal: RH STROKE PATIENT EDUCATION Description: See Patient Education module for education specifics  Outcome: Progressing Goal: Nutrition Consult-if indicated Outcome: Progressing   Problem: RH SKIN INTEGRITY Goal: RH STG SKIN FREE OF INFECTION/BREAKDOWN Description: Skin free of breakdown and infection with Mod assist Outcome: Progressing Goal: RH STG MAINTAIN SKIN INTEGRITY WITH ASSISTANCE Description: STG Maintain Skin Integrity With Assistance. MOD Outcome: Progressing   Problem: RH SAFETY Goal: RH STG ADHERE TO SAFETY PRECAUTIONS W/ASSISTANCE/DEVICE Description: STG Adhere to Safety Precautions With Assistance/Device. Mod Outcome: Progressing Goal: RH STG DECREASED RISK OF FALL WITH ASSISTANCE Description: STG Decreased Risk of Fall With Assistance. Mod assist Outcome: Progressing   Problem: RH KNOWLEDGE DEFICIT Goal: RH STG INCREASE KNOWLEDGE OF HYPERTENSION Description: Patient/family will be able to describe management of hypertension including medication, diet, and lifestyle interventions with cues/handouts Outcome: Progressing Goal: RH STG INCREASE KNOWLEDGE OF DYSPHAGIA/FLUID INTAKE Description: Patient/Family will be able to describe and demonstrate safe swallowing techniques and identify foods in D2 diet scope with cues/handouts Outcome: Progressing Goal: RH STG INCREASE KNOWLEGDE OF HYPERLIPIDEMIA Description: Patient/family will be able to describe management of hyperlipidemia including medication, diet, and lifestyle interventions with cues/handouts Outcome: Progressing Goal: RH STG INCREASE KNOWLEDGE OF STROKE PROPHYLAXIS Description: Patient/family will be able to describe stroke prophylaxis including medication, diet, and lifestyle interventions with cues/handouts Outcome: Progressing

## 2019-07-23 NOTE — Progress Notes (Signed)
Occupational Therapy Session Note  Patient Details  Name: Russell Russell MRN: [id number] Date of Birth: [birth year]  Today's Date: 07/23/2019 OT Individual Time: 0865-7846 OT Individual Time Calculation (min): 75 min    Short Term Goals: Week 3:  OT Short Term Goal 1 (Week 3): Pt will don pants with mod assist sit > stand OT Short Term Goal 2 (Week 3): Pt will complete sit > stand with min assist during self-care tasks OT Short Term Goal 3 (Week 3): Pt will complete UB dressing with min assist OT Short Term Goal 4 (Week 3): Pt will complete toilet transfers with mod assist to Rt and Lt  Skilled Therapeutic Interventions/Progress Updates:  Patient met lying supine in agreement with OT treatment session with focus on self-care re-education, functional transfers, and NMR as detailed below. RN and lab tech present from [phone] for vitals and lab work. Over the past 2 days, patient with fever, increased WBC and UTI exacerbating pusher syndrome and making static/dynamic sitting/standing balance and functional transfers a hardship with patient requiring +2 assist. Wife present at bedside with OT providing education on patients CLOF and focus of therapy this date. Patient found to be incontinent of bowel requiring +2 assist in supine for hygiene/clothing management. Patient also completed face washing with set-up assist and UB dressing with Min A in supine. Supine to EOB for LB dressing with patient requiring Max A to thread BLE through pant legs and Max cueing for correction of L pushing. Return to supine with patient able to bridge and pull pants over bilateral hips. From EOB patient required Max A for squat-pivot transfer to [place] wc on L. Seated at sink level, patient required Min A to remove toothbrush from case and place toothpaste on toothbrush. NMR with focus on gross grasp with use of foam blocks. Patient required max cueing for opening/closing hand as opposed to attempting to pick up foam block  with pincer grasp. Patient demonstrates increased difficulty with placing foam blocks into cup. Session concluded with patient seated in TIS wc with call bell within reach, belt alarm activated, and all needs met.   Therapy Documentation Precautions:  Precautions Precautions: Fall, Other (comment) Precaution Comments: permissive HTN initially (<220/120) then normotension, L sided weakness, aspiration precautions (dysphagia 2 with thins), ; L hemiparesis Restrictions Weight Bearing Restrictions: No  Therapy/Group: Individual Therapy  Russell Russell 07/23/2019, 7:50 AM

## 2019-07-23 NOTE — Progress Notes (Signed)
Physical Therapy Session Note  Patient Details  Name: Russell Russell MRN: [id number] Date of Birth: [birth year]  Today's Date: 07/23/2019 PT Individual Time: 1130-1154 and 1415-1527  PT Individual Time Calculation (min): 24 min and 72 min  Short Term Goals: Week 2:  PT Short Term Goal 1 (Week 2): pt will transfer bed<>chair min A PT Short Term Goal 1 - Progress (Week 2): Met PT Short Term Goal 2 (Week 2): pt will transfer sit<>stand CGA PT Short Term Goal 2 - Progress (Week 2): Progressing toward goal PT Short Term Goal 3 (Week 2): Pt will ambulate 69f with LRAD max A of 1 PT Short Term Goal 3 - Progress (Week 2): Progressing toward goal Week 3:  PT Short Term Goal 1 (Week 3): pt will transfer sit<>stand CGA PT Short Term Goal 2 (Week 3): Pt will ambulate 255fwith LRAD max A of 1 PT Short Term Goal 3 (Week 3): Pt will perform WC mobility [id number] Therapeutic Interventions/Progress Updates:   Treatment Session 1:1130-1156 24 min Received pt being transferred back to bed with NT to use bathroom, PT took over with care. Pt agreeable to therapy and denied any pain during session. Session focused on toileting, dressing, bed mobility, and generalized strengthening. Pt with soiled brief and rolled to R and L with min A and use of bedrails to doff soiled brief max A. Pt incontinent of bladder and with mild smear in brief. Pt required total assist for peri-care and max A to don clean brief. Pt able to pull pants over hips in supine with min A and encouragement to use L UE. Pt's IV pole beeping and therapist located RN to adjust as necessary. [facility] and use of Trendelenburg bed position with encouragement to use L UE. Concluded session with pt supine in bed, needs within reach, and bed alarm on.   Treatment Session 2: 1415-1527 72 min Received pt supine in bed, pt agreeable to therapy, and denied any pain during session. Pt more verbose and slightly argumentative  during today's session. Session focused on functional mobility/transfers, LE strength, dynamic sitting/standing balance/cooridnation, NMR, motor control/planning, midline orientation, and improved activity tolerance. Checked to ensure brief was dry prior to starting therapy and it was. Pt transferred supine<>sitting EOB with min A. Donned shoes sitting EOB with total assist. Pt transferred bed<>TIS WC squat<>pivot mod A and transported to therapy gym in TIS WC total assist. Pt transferred squat<>pivot TIS WC<>mat mod A and scooted to R with wall against him. Pt transferred sit<>stand mod A with L UE supported around therapist and worked on midline orientation, weight shifting to R, upright posture, and L knee extension using mirror for visual feedback and +2 assist to cue pt to lean to the R. While standing pt worked on weight shifting to the R and grabbing sticky notes with L UE from upper R side of wall and sticking them on lower R side of window x 3 trials. Pt required max cues to weight shift anteriorly to grasp sticky note. Returned to sitting and worked on recEstate agentong mat x 15 reps to L. Pt required max verbal cues for weight shifting anteriorly, pushing through LEs, and scooting hips. Pt with tendency to push to L during activity and with increased difficulty clearing buttocks. Pt transferred sit<>stand with 3 musketeer assist x 3 trials using mirror for visual feedback. Worked on midline orientation, pelvic alignment and rotation, upright posture, and L knee extension. Pt required  verbal cues for hip extension and tactile cues for head/trunk control while standing. Pt frequently returning to sitting position throughuot activities due to increased fatigue. Worked on dynamic sitting balance reaching for cones with L LE, crossing midline, and stacking them on R side of mat x 5 reps to facilitate weight shifting to R, trunk control, and fine motor control of L UE. Pt with increased  difficulty grasping cone demonstrating moderate dysmetria and poor spatial awareness during activity. Pt transferred squat<>pivot mat<>TIS max A and was transported back to room in TIS [facility] total assist. Concluded session with pt semi-reclined in TIS WC, needs within reach, and seatbelt alarm on.   Sitting stacking cones.  Therapy Documentation Precautions:  Precautions Precautions: Fall, Other (comment) Precaution Comments: permissive HTN initially (<220/120) then normotension, L sided weakness, aspiration precautions (dysphagia 2 with thins), ; L hemiparesis Restrictions Weight Bearing Restrictions: No  Therapy/Group: Individual Therapy Alfonse Alpers PT, DPT   07/23/2019, 7:41 AM

## 2019-07-23 NOTE — Progress Notes (Addendum)
Patient MEWS is green with score of 1 due to elevated hear rate. Patient vital signs re-checked after PRN Tylenol given at 01:28 AM. T 100.2 P 102 BP 114/78 R 18 O2 98% RA.  Patient is sweating and  still warm. No c/o discomfort or pain. Nurse and CNA provided patient incontinent care, PVR volume is 46. Will continue to monitor patient.

## 2019-07-23 NOTE — Progress Notes (Signed)
Patient woke up at 4:50 AM. No shivering, chills and not in distress. Nurse check patient vital signs T 98.8 P 85 BP 120/92 R 18 O2 100% RA. MEWS is green with score of 0. Patient stated "I feel better". Nurse offered fluids and put patient bed to lower position with call light within reach and will continue to monitor patient.

## 2019-07-24 ENCOUNTER — Inpatient Hospital Stay (HOSPITAL_COMMUNITY): Payer: BC Managed Care – PPO

## 2019-07-24 ENCOUNTER — Inpatient Hospital Stay (HOSPITAL_COMMUNITY): Payer: BC Managed Care – PPO | Admitting: Occupational Therapy

## 2019-07-24 DIAGNOSIS — R509 Fever, unspecified: Secondary | ICD-10-CM

## 2019-07-24 LAB — HEPATITIS PANEL, ACUTE
HCV Ab: NONREACTIVE
Hep A IgM: NONREACTIVE
Hep B C IgM: NONREACTIVE
Hepatitis B Surface Ag: NONREACTIVE

## 2019-07-24 LAB — URINE CULTURE: Culture: <10000 — AB

## 2019-07-24 LAB — ECHOCARDIOGRAM COMPLETE
Height: 70 in
Weight: 2056 oz

## 2019-07-24 LAB — PROCALCITONIN: Procalcitonin: 0.41 ng/mL

## 2019-07-24 IMAGING — CT CT ABD-PELV W/ CM
3 of 6 series · 15 of 46 positions shown, 17 images · IV contrast (APPLIED)
Comparison: [DATE]

CLINICAL DATA: Fever of unknown origin

EXAM:
CT ABDOMEN AND PELVIS WITH CONTRAST
TECHNIQUE: Multidetector CT imaging of the abdomen and pelvis was performed
using the standard protocol following bolus administration of
intravenous contrast.
CONTRAST:  100mL OMNIPAQUE IOHEXOL 300 MG/ML  SOLN

[Series 3: abd/ pelvis 5.0 i30f 2 · axial · 0.61mm/px · z∈[-690,-330]mm · 10 of 89 slices shown, 12 images]
[im 9/89  soft-tissue]
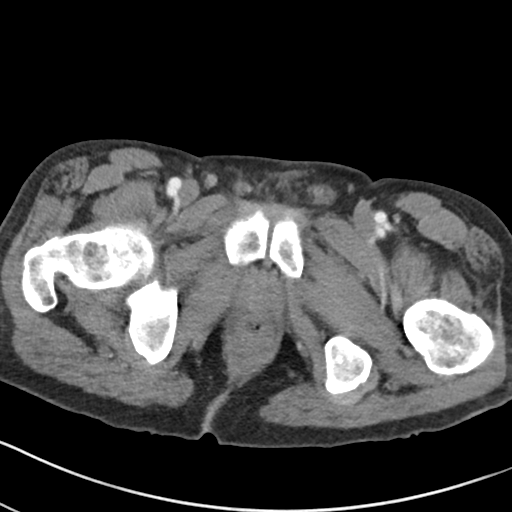
[im 9/89  bone]
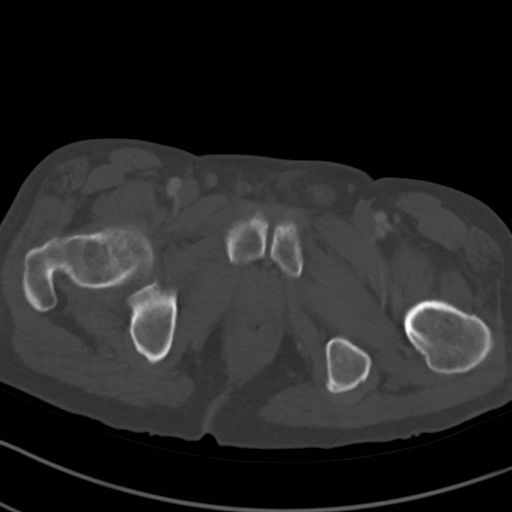
[im 17/89  soft-tissue]
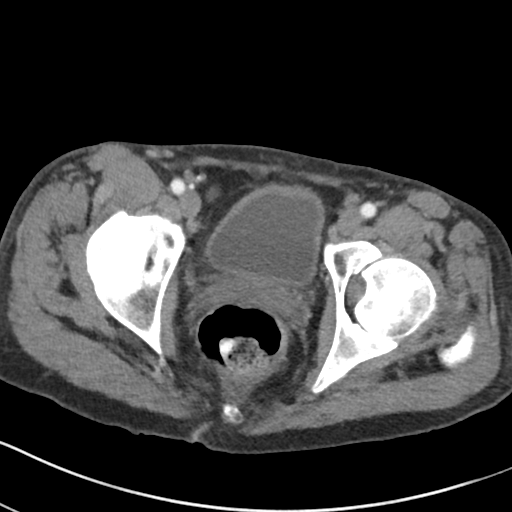
[im 25/89  soft-tissue]
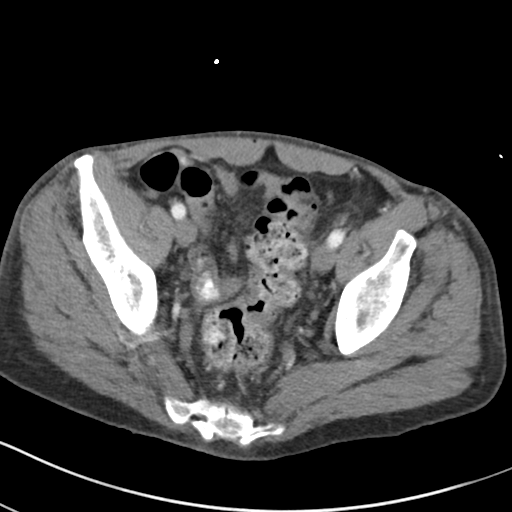
[im 33/89  soft-tissue]
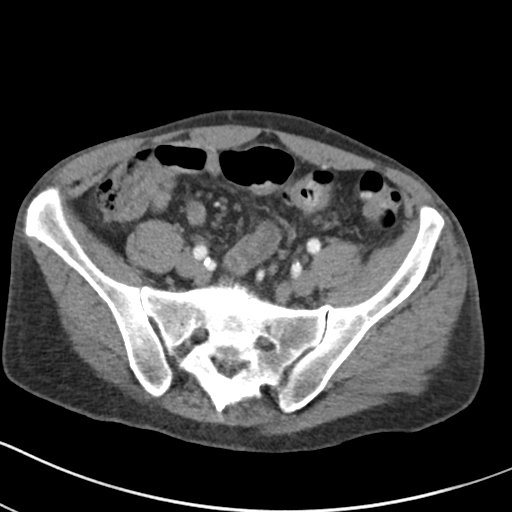
[im 41/89  soft-tissue]
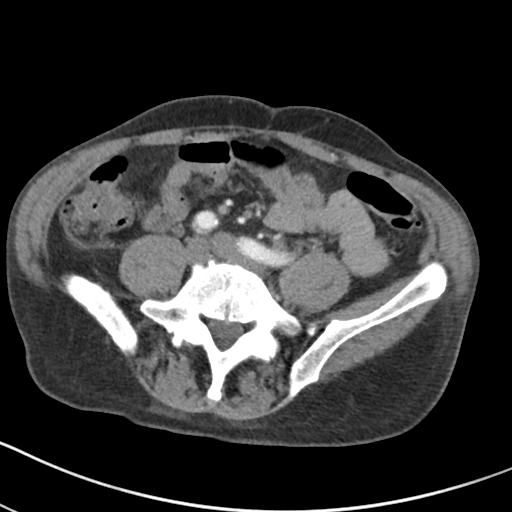
[im 49/89  soft-tissue]
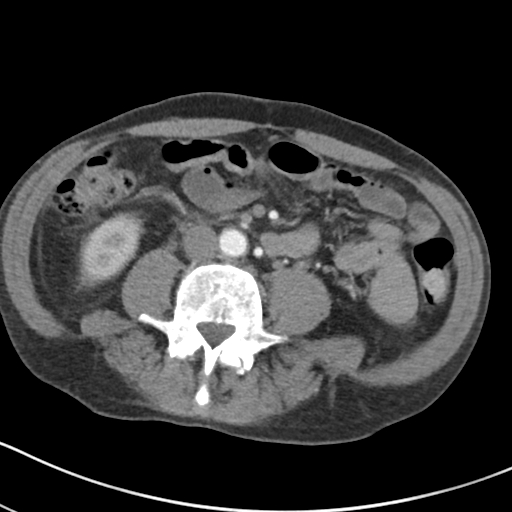
[im 57/89  soft-tissue]
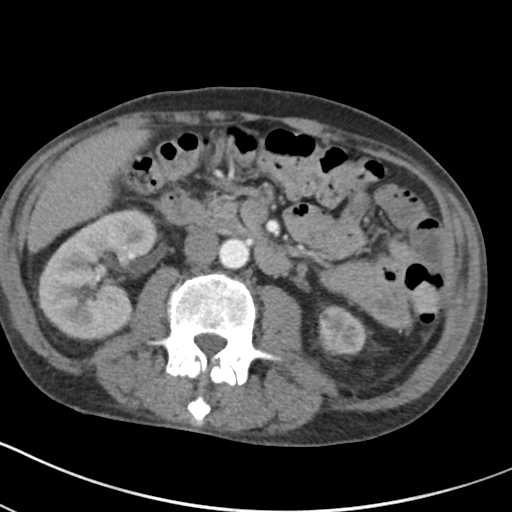
[im 65/89  soft-tissue]
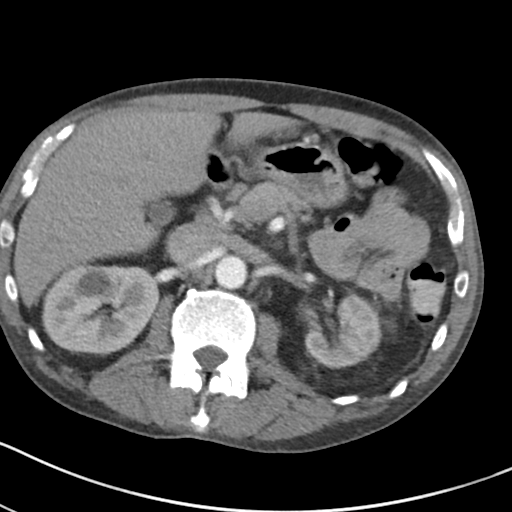
[im 73/89  soft-tissue]
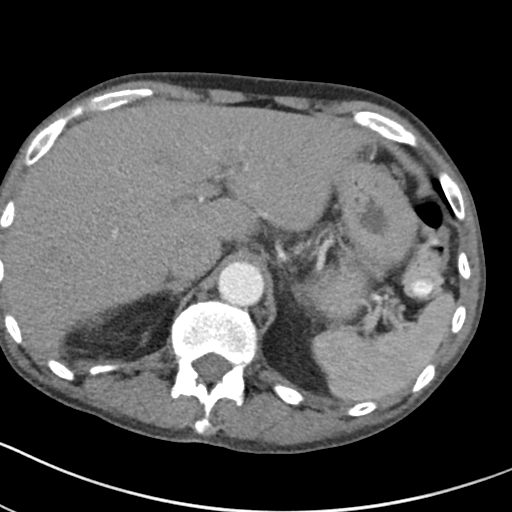
[im 73/89  bone]
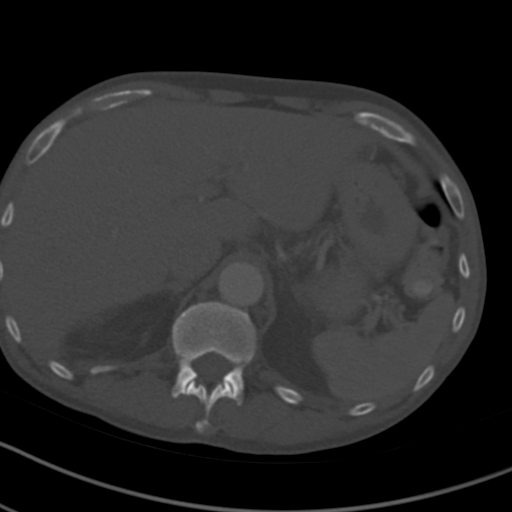
[im 81/89  soft-tissue]
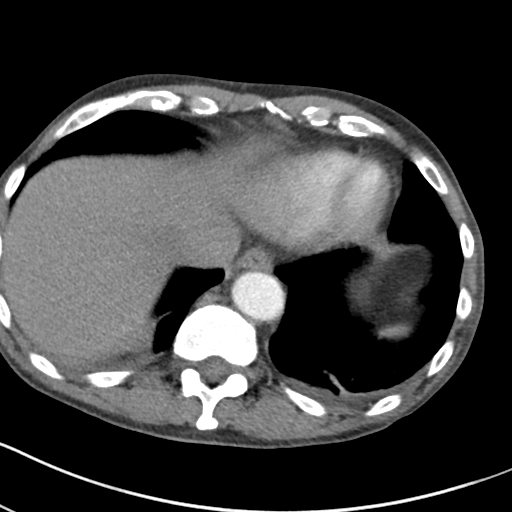

[Series 4: lung bases · axial · 0.61mm/px · z∈[-380,-335]mm · 2 of 27 slices shown]
[im 9/27  bone]
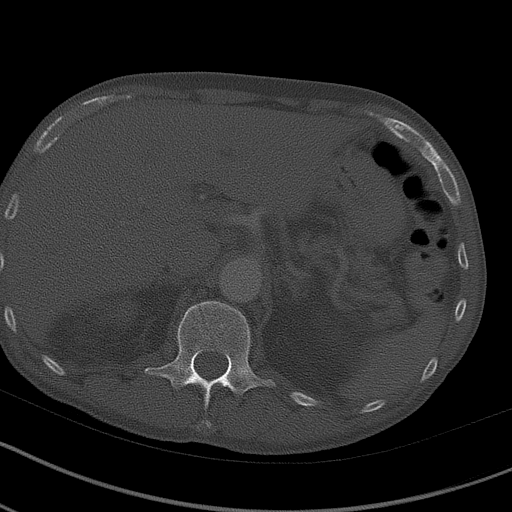
[im 18/27  bone]
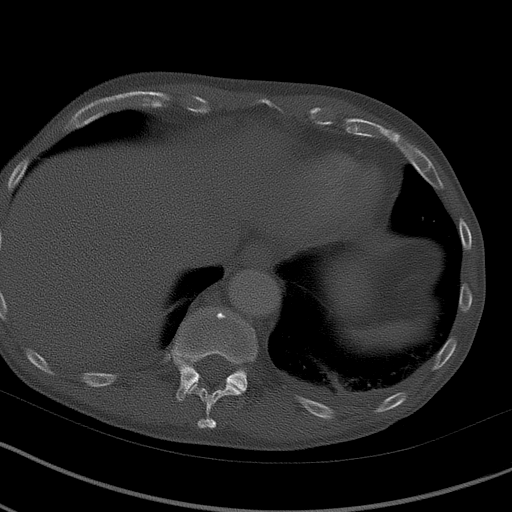

[Series 6: coronal soft tissue · coronal · 0.79mm/px · 3 of 80 slices shown]
[im 27/80  soft-tissue]
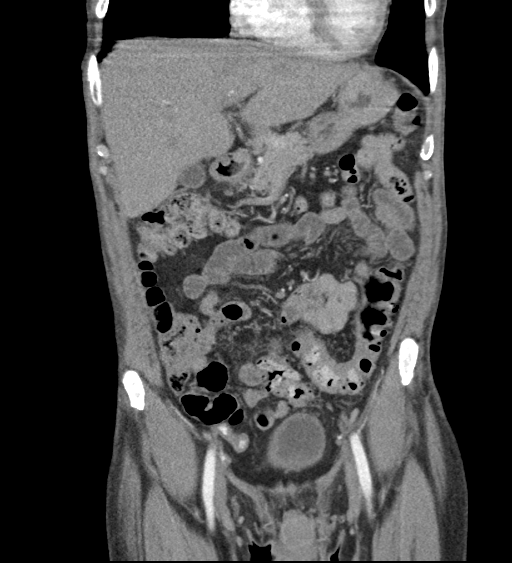
[im 36/80  soft-tissue]
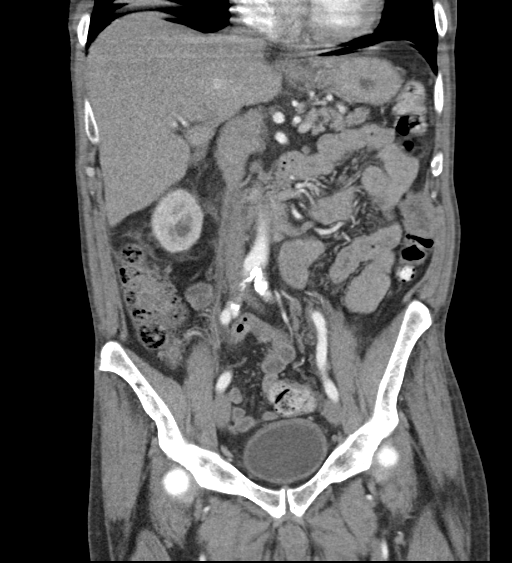
[im 44/80  soft-tissue]
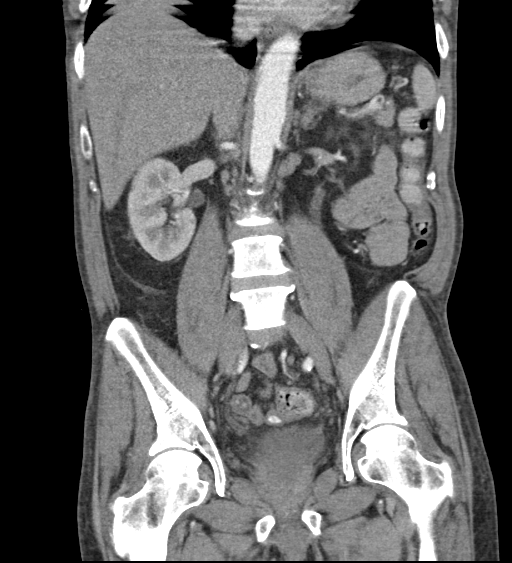

[15 of 46 positions shown; findings below may reference images not displayed]

FINDINGS: Lower chest: Bibasilar atelectasis.  Trace left pleural effusion.

Hepatobiliary: No focal hepatic abnormality. Gallbladder
unremarkable.

Pancreas: No focal abnormality or ductal dilatation.

Spleen: No focal abnormality.  Normal size.

Adrenals/Urinary Tract: Atrophic left kidney. Small bilateral renal
cysts. No hydronephrosis. Adrenal glands and urinary bladder
unremarkable.

Stomach/Bowel: Normal appendix. Moderate stool throughout the colon.
Stomach, large and small bowel grossly unremarkable.

Vascular/Lymphatic: Aortic atherosclerosis. No enlarged abdominal or
pelvic lymph nodes.

Reproductive: Prostate enlargement.

Other: No free fluid or free air.

Musculoskeletal: No acute bony abnormality.
IMPRESSION: Trace left pleural effusion.

Atrophic left kidney, stable since prior study.

Aortic atherosclerosis.

Moderate stool burden throughout the colon.

Prostate enlargement.

No acute findings.

## 2019-07-24 MED ORDER — IOHEXOL 300 MG/ML  SOLN
100.0000 mL | Freq: Once | INTRAMUSCULAR | Status: AC | PRN
  Administered 2019-07-24: 100 mL via INTRAVENOUS

## 2019-07-24 MED ORDER — IOHEXOL 9 MG/ML PO SOLN: 500.0000 mL | ORAL | Status: AC

## 2019-07-24 NOTE — Progress Notes (Signed)
Pt transported off unit to Radiology. Delia Heady RN

## 2019-07-24 NOTE — Progress Notes (Signed)
UE venous duplex       has been completed. Preliminary results can be found under CV proc through chart review. June Leap, BS, RDMS, RVT

## 2019-07-24 NOTE — Progress Notes (Signed)
[place] PHYSICAL MEDICINE & REHABILITATION PROGRESS NOTE  Subjective/Complaints: Patient seen sitting up in bed this morning.  He states he slept well overnight.  He denies fevers, chills.  However, per report, fever overnight.  ROS: Denies fevers, chills, CP, SOB, N/V/D  Objective: Vital Signs: Blood pressure (!) 128/93, pulse 85, temperature (!) 101.2 F (38.4 C), resp. rate 16, height 5\' 10"  (1.778 m), weight 58.3 kg, SpO2 100 %. DG Chest 2 View  Result Date: 07/23/2019 CLINICAL DATA:  Fever. EXAM: CHEST - 2 VIEW COMPARISON:  Jul 20, 2019 FINDINGS: There is no evidence of acute infiltrate, pleural effusion or pneumothorax. The heart size and mediastinal contours are within normal limits. A loop recorder device is again noted. The visualized skeletal structures are unremarkable. IMPRESSION: No active cardiopulmonary disease. Electronically Signed   By: Virgina Norfolk M.D.   On: 07/23/2019 00:00   CT HEAD WO CONTRAST  Result Date: 07/22/2019 CLINICAL DATA:  Fever of unknown origin. Recent infarct. EXAM: CT HEAD WITHOUT CONTRAST TECHNIQUE: Contiguous axial images were obtained from the base of the skull through the vertex without intravenous contrast. COMPARISON:  Brain MRI 07/01/2019 Head CT 06/30/2019 FINDINGS: Brain: There is no mass, hemorrhage or extra-axial collection. The size and configuration of the ventricles and extra-axial CSF spaces are normal. There is hypoattenuation of the white matter, most commonly indicating chronic small vessel disease. Unchanged appearance of multiple recent infarcts of the right basal ganglia and centrum semiovale. Vascular: No abnormal hyperdensity of the major intracranial arteries or dural venous sinuses. No intracranial atherosclerosis. Skull: The visualized skull base, calvarium and extracranial soft tissues are normal. Sinuses/Orbits: No fluid levels or advanced mucosal thickening of the visualized paranasal sinuses. No mastoid or middle ear  effusion. The orbits are normal. IMPRESSION: Unchanged appearance of multiple recent infarcts of the right basal ganglia and centrum semiovale. No hemorrhage or mass effect. Electronically Signed   By: Ulyses Jarred M.D.   On: 07/22/2019 20:06   Recent Labs    07/22/19 1749 07/23/19 0711  WBC 7.2 5.2  HGB 14.3 15.4  HCT 42.2 44.4  PLT 221 210   Recent Labs    07/22/19 0641 07/23/19 0711  NA 137 138  K 4.1 4.1  CL 104 109  CO2 24 18*  GLUCOSE 105* 106*  BUN 15 11  CREATININE 1.29* 1.25*  CALCIUM 8.6* 8.9    Physical Exam: BP (!) 128/93 (BP Location: Right Arm)   Pulse 85   Temp (!) 101.2 F (38.4 C)   Resp 16   Ht 5\' 10"  (1.778 m)   Wt 58.3 kg   SpO2 100%   BMI 18.44 kg/m  Constitutional: No distress . Vital signs reviewed. HENT: Normocephalic.  Atraumatic. Eyes: EOMI. No discharge. Cardiovascular: No JVD. Respiratory: Normal effort.  No stridor. GI: Non-distended. Skin: Warm and dry.  Intact. Psych: Normal mood.  Normal behavior. Musc: No edema in extremities.  No tenderness in extremities. Neuro: Alert Motor: Left upper extremity: Shoulder abduction 4/5, distally 4/5, with apraxia and ataxia  Left lower extremity: Hip flexion 3-/5, knee extension 3/5, ankle dorsiflexion 4/5 Dysarthria, stable Left facial weakness, stable.   Assessment/Plan: 1. Functional deficits secondary to multiple anterior and posterior right brain infarcts which require 3+ hours per day of interdisciplinary therapy in a comprehensive inpatient rehab setting.  Physiatrist is providing close team supervision and 24 hour management of active medical problems listed below.  Physiatrist and rehab team continue to assess barriers to discharge/monitor patient progress toward  functional and medical goals  Care Tool:  Bathing    Body parts bathed by patient: Left arm, Chest, Abdomen, Front perineal area, Right upper leg, Left upper leg, Face   Body parts bathed by helper: Buttocks, Right  arm     Bathing assist Assist Level: Moderate Assistance - Patient 50 - 74%     Upper Body Dressing/Undressing Upper body dressing   What is the patient wearing?: Pull over shirt    Upper body assist Assist Level: Minimal Assistance - Patient > 75%    Lower Body Dressing/Undressing Lower body dressing      What is the patient wearing?: Pants, Incontinence brief     Lower body assist Assist for lower body dressing: 2 Helpers     Toileting Toileting Toileting Activity did not occur (Clothing management and hygiene only): N/A (no void or bm)  Toileting assist Assist for toileting: 2 Helpers     Transfers Chair/bed transfer  Transfers assist     Chair/bed transfer assist level: Moderate Assistance - Patient 50 - 74%     Locomotion Ambulation   Ambulation assist   Ambulation activity did not occur: Safety/medical concerns(L hemi, decreased postural control/balance, fatigue)  Assist level: 2 helpers Assistive device: Other (comment)(wall on R side without rail) Max distance: 5 ft   Walk 10 feet activity   Assist  Walk 10 feet activity did not occur: Safety/medical concerns(L hemi, decreased postural control/balance, fatigue)  Assist level: 2 helpers Assistive device: Lite Gait   Walk 50 feet activity   Assist Walk 50 feet with 2 turns activity did not occur: Safety/medical concerns(L hemi, decreased postural control/balance, fatigue)         Walk 150 feet activity   Assist Walk 150 feet activity did not occur: Safety/medical concerns(L hemi, decreased postural control/balance, fatigue)         Walk 10 feet on uneven surface  activity   Assist Walk 10 feet on uneven surfaces activity did not occur: Safety/medical concerns(L hemi, decreased postural control/balance, fatigue)         Wheelchair     Assist Will patient use wheelchair at discharge?: Yes Type of Wheelchair: Manual    Wheelchair assist level: Dependent - Patient 0%       Wheelchair 50 feet with 2 turns activity    Assist        Assist Level: Dependent - Patient 0%   Wheelchair 150 feet activity     Assist     Assist Level: Dependent - Patient 0%      Medical Problem List and Plan: 1.  Left side weakness secondary to multiple scattered anterior and posterior right brain infarcts.  Status post loop recorder  Continue CIR  Repeat head CT personally reviewed, unchanged  Discussed with therapies-overall improving function since abx 2.  Antithrombotics: -DVT/anticoagulation: Lovenox             -antiplatelet therapy: Aspirin 325 mg daily and Plavix 75 mg daily x3 months then aspirin alone 3. Pain Management: Tylenol as needed 4. Mood: Provide emotional support             -antipsychotic agents: N/A 5. Neuropsych: This patient is ?fully capable of making decisions on his own behalf. 6. Skin/Wound Care: Routine skin checks 7. Fluids/Electrolytes/Nutrition: Routine in and outs. 8.  Hypertension.  Lopressor 12.5 mg twice daily, increased to 25 on 5/8.    Lisinopril 2.5 qhs started on 5/5  Elevated diastolic pressures on 0000000  Monitor with increased mobility  9.  Hyperlipidemia. Lipitor 10.  Dysphagia.  Dysphagia #3 thins liquids.  Follow-up speech therapy  Cont to advance diet as tolerated 11.  History of tobacco and alcohol use.  NicoDerm patch.  Provide counseling.    Monitor for withdrawal 12.  Moderate hypoalbuminemia  Supplement initiated on 4/28 13.  WBCs  WBCs 5.2 on 5/13, labs ordered for Monday  Cont to monitor 13.  AKI  Echocardiogram reviewed, EF ~60%  Continue IVF  Creatinine 1.25 on 5/13, labs ordered for Monday 14.  Sepsis  See #12  Febrile again  UA suspicious, urine culture multiple species; repeat UA suspicious again, urine culture insignificant growth  Blood cultures NGTD; repeat blood cultures NGTD  Chest x-ray personally reviewed again, unremarkable for infection  Influenza negative  Covid  negative  Empiric Septra d/ced, IV Vanco and cefepime initiated  Procalcitonin within normal limits on 5/14  ID consulted   LOS: 17 days A FACE TO FACE EVALUATION WAS PERFORMED  Kenley Rettinger Lorie Phenix 07/24/2019, 7:56 AM

## 2019-07-24 NOTE — Plan of Care (Signed)
  Problem: Consults Goal: RH STROKE PATIENT EDUCATION Description: See Patient Education module for education specifics  Outcome: Progressing Goal: Nutrition Consult-if indicated Outcome: Progressing   Problem: RH SKIN INTEGRITY Goal: RH STG SKIN FREE OF INFECTION/BREAKDOWN Description: Skin free of breakdown and infection with Mod assist Outcome: Progressing Goal: RH STG MAINTAIN SKIN INTEGRITY WITH ASSISTANCE Description: STG Maintain Skin Integrity With Assistance. MOD Outcome: Progressing   Problem: RH SAFETY Goal: RH STG ADHERE TO SAFETY PRECAUTIONS W/ASSISTANCE/DEVICE Description: STG Adhere to Safety Precautions With Assistance/Device. Mod Outcome: Progressing Goal: RH STG DECREASED RISK OF FALL WITH ASSISTANCE Description: STG Decreased Risk of Fall With Assistance. Mod assist Outcome: Progressing   Problem: RH KNOWLEDGE DEFICIT Goal: RH STG INCREASE KNOWLEDGE OF HYPERTENSION Description: Patient/family will be able to describe management of hypertension including medication, diet, and lifestyle interventions with cues/handouts Outcome: Progressing Goal: RH STG INCREASE KNOWLEDGE OF DYSPHAGIA/FLUID INTAKE Description: Patient/Family will be able to describe and demonstrate safe swallowing techniques and identify foods in D2 diet scope with cues/handouts Outcome: Progressing Goal: RH STG INCREASE KNOWLEGDE OF HYPERLIPIDEMIA Description: Patient/family will be able to describe management of hyperlipidemia including medication, diet, and lifestyle interventions with cues/handouts Outcome: Progressing Goal: RH STG INCREASE KNOWLEDGE OF STROKE PROPHYLAXIS Description: Patient/family will be able to describe stroke prophylaxis including medication, diet, and lifestyle interventions with cues/handouts Outcome: Progressing

## 2019-07-24 NOTE — Progress Notes (Signed)
Occupational Therapy Session Note  Patient Details  Name: Russell Russell MRN: [id number] Date of Birth: 03-12-1951  Today's Date: 07/24/2019 OT Individual Time: 0855-1005 OT Individual Time Calculation (min): 70 min    Short Term Goals: Week 3:  OT Short Term Goal 1 (Week 3): Pt will don pants with mod assist sit > stand OT Short Term Goal 2 (Week 3): Pt will complete sit > stand with min assist during self-care tasks OT Short Term Goal 3 (Week 3): Pt will complete UB dressing with min assist OT Short Term Goal 4 (Week 3): Pt will complete toilet transfers with mod assist to Rt and Lt  Skilled Therapeutic Interventions/Progress Updates:    Treatment session with focus on trunk control with static and dynamic sitting balance during therapeutic activity and self-care retraining.  Pt received supine in bed, incontinent of urine with no awareness.  Engaged in bed mobility with min assist with cues for hand placement.  Engaged in LB bathing/dressing at sit > stand level from EOB.  Pt with increased Lt lateral lean/pushing during dynamic sitting tasks.  Max assist sit > stand to allow therapist to wash buttocks.  Due to increased pushing, required multiple stands to allow for thoroughness of bathing.  Total assist LB dressing with pt demonstrating increased frustration with sitting balance and task.  Required +2 for safety during standing to pull pants up with 2nd person assisting to pull pants over hips.  Completed squat pivot tranfers throughout session with increased focus on hand placement across chest to decrease pushing.  Pt requiring mod-max assist for squat pivot transfers.  Engaged in donning socks/shoes in sitting at edge of mat with focus on trunk control and crossing legs in to figure 4 position to challenge sitting and allow for attempts to don socks/shoes.  Educated on hemi-technique with donning socks, ultimately still requiring assistance.  Engaged in sit> stand with mod-max assist  (therapist positioned under LUE to facilitate weight shift).  Engaged in weight shifting in standing with focus on pt shifting weight to Rt to tap stool with Rt hip.  Pt required max facilitation at hips with therapist momentum and positioning.  Provided visual and proprioceptive feedback for positioning and weight shifting with minimal carryover this session.  Pt returned to room and left tilted back in TIS w/c with seat belt alarm on and all needs in reach.  Therapy Documentation Precautions:  Precautions Precautions: Fall Precaution Comments: permissive HTN initially (<220/120) then normotension, L sided weakness, aspiration precautions (dysphagia 2 with thins), ; L hemiparesis Restrictions Weight Bearing Restrictions: No General:   Vital Signs: Therapy Vitals Temp: 97.8 F (36.6 C) Pulse Rate: 74 Resp: 17 BP: (!) 126/95 Patient Position (if appropriate): Lying Oxygen Therapy SpO2: 99 % O2 Device: Room Air Pain:  Pt with no c/o pain   Therapy/Group: Individual Therapy  [name] 07/24/2019, 3:55 PM

## 2019-07-24 NOTE — Progress Notes (Signed)
  Echocardiogram 2D Echocardiogram has been performed.  Russell Russell M 07/24/2019, 12:36 PM

## 2019-07-24 NOTE — Consult Note (Signed)
Date of Admission:  07/07/2019          Reason for Consult: Fever of unknown origin   Referring Provider: Reesa Chew, NP   Assessment:  1. FUO 2. Recent CVA 3. Loop recorder in place   Plan:  1. CT chest abdomen pelvis 2. 2D echocardiogram 3. Duplex upper extremities 4. Labs for FUO 5. If no intra-abdominal or chest abscess discovered on imaging we will discontinue all of his antibiotics  Dr. Linus Salmons will check in on him this weekend and Dr. Baxter Flattery will pick up the service on Monday  Principal Problem:   Cerebrovascular accident (CVA) of right basal ganglia ([place]) Active Problems:   Essential hypertension   Tobacco abuse   Alcohol abuse   Leukopenia   Benign essential HTN   Hypoalbuminemia   Labile blood pressure   Blood pressure increase diastolic   Fever   Sepsis without acute organ dysfunction (HCC)   Elevated BUN   AKI (acute kidney injury) ([place])   Scheduled Meds: . aspirin  300 mg Rectal Daily   Or  . aspirin  325 mg Oral Daily  . atorvastatin  40 mg Oral Daily  . clopidogrel  75 mg Oral Daily  . enoxaparin (LOVENOX) injection  40 mg Subcutaneous Q24H  . feeding supplement (ENSURE ENLIVE)  237 mL Oral BID BM  . folic acid  1 mg Oral Daily  . metoprolol tartrate  25 mg Oral BID  . multivitamin with minerals  1 tablet Oral Daily  . nicotine  21 mg Transdermal Daily  . pantoprazole  40 mg Oral Daily  . polycarbophil  1,250 mg Oral BID  . thiamine  100 mg Oral Daily   Continuous Infusions: . sodium chloride 100 mL/hr at 07/23/19 0820  . ceFEPime (MAXIPIME) IV 2 g (07/24/19 0536)   PRN Meds:.acetaminophen **OR** acetaminophen (TYLENOL) oral liquid 160 mg/5 mL **OR** acetaminophen, lidocaine, senna-docusate, sorbitol  HPI: Russell Russell is a 69 y.o. male with history of alcohol and tobacco abuse hyperlipidemia who is been having problems with memory recently.  He presented on 20 April with left-sided hemiparesis and was found to have an unremarkable  cranial CT.  CT angiogram did not show any evidence of large vessel occlusion.  MRI showed multiple scattered acute ischemic infarcts involving the posterior right basal ganglia as well as right cerebral hemisphere and cerebellum.  Echocardiogram showed an EF of 60% and no evidence of a source of embolic strokes.  Lower extremity Dopplers were negative.  He was on inpatient service and had a loop recorder implanted by Dr. Rayann Heman with electrophysiology.  In the interim he has been transferred over to the rehabilitation service.  While there he has developed fevers high as 102.5 on the 10th and then the 12th.  In the interim he was treated with Bactrim for possible urinary tract infection though does not seem to have symptoms suggestive of that.  His antibiotics were broadened and repeat blood cultures and urine culture sent on the 12th.  He has been on vancomycin and cefepime.  He did have some loose stools yesterday after antibiotics were initiated but otherwise does not seem to have much in the way of focal symptoms besides the fever.  Repeat CT of the head did not show any new abnormality to suggest source of the fever  I will do a CT chest abdomen pelvis to look for occult infection.  Certainly the fact that he had a loop recorder placed makes me  worry that that site could be infected though it does not overtly appear infected does not have evidence of septic thrombophlebitis either.  I will have her get a Doppler of his upper extremities.  We will also send off standard labs for FUO work-up.  If his imaging of chest abdomen pelvis is unremarkable for infection I will discontinue all of his antibiotics  Review of Systems: Review of Systems  Constitutional: Negative for chills, diaphoresis, fever, malaise/fatigue and weight loss.  HENT: Negative for congestion, hearing loss, sore throat and tinnitus.   Eyes: Negative for blurred vision and double vision.  Respiratory: Negative for cough, sputum  production, shortness of breath and wheezing.   Cardiovascular: Negative for chest pain, palpitations and leg swelling.  Gastrointestinal: Negative for abdominal pain, blood in stool, constipation, diarrhea, heartburn, melena, nausea and vomiting.  Genitourinary: Negative for dysuria, flank pain and hematuria.  Musculoskeletal: Negative for back pain, falls, joint pain and myalgias.  Skin: Negative for itching and rash.  Neurological: Negative for dizziness, sensory change, focal weakness, loss of consciousness, weakness and headaches.  Endo/Heme/Allergies: Does not bruise/bleed easily.  Psychiatric/Behavioral: Negative for depression, memory loss and suicidal ideas. The patient is not nervous/anxious.     Past Medical History:  Diagnosis Date  . Alcohol abuse    drinks 1 gallon of Gin every weekend, nothing during the week x >15 yrs  . Elevated transaminase level    +elev bili: abd u/s 06/2014 showed stable small hepatic hemangiomas, o/w normal.  . Essential hypertension   . GERD (gastroesophageal reflux disease)   . Hyperlipidemia 03/2014   Atorv started-chol improved  . Impaired fasting glucose 03/2014  . Nephrolithiasis   . PUD (peptic ulcer disease)   . Tobacco dependence    chantix: "psych effects"    Social History   Tobacco Use  . Smoking status: Current Every Day Smoker    Packs/day: 0.50    Years: 20.00    Pack years: 10.00    Types: Cigarettes  . Smokeless tobacco: Never Used  Substance Use Topics  . Alcohol use: Yes    Alcohol/week: 8.0 standard drinks    Types: 8 Cans of beer per week    Comment: Weekends only  . Drug use: No    Family History  Problem Relation Age of Onset  . Colon cancer Mother   . Cancer Father   . Breast cancer Sister   . Breast cancer Sister    Allergies  Allergen Reactions  . Penicillins Rash    OBJECTIVE: Blood pressure (!) 128/93, pulse 85, temperature (!) 101.2 F (38.4 C), resp. rate 16, height 5\' 10"  (1.778 m), weight  58.3 kg, SpO2 100 %.  Physical Exam Constitutional:      Appearance: He is well-developed.  HENT:     Head: Normocephalic and atraumatic.     Nose: Nose normal.     Mouth/Throat:     Mouth: Mucous membranes are moist.  Eyes:     Extraocular Movements: Extraocular movements intact.     Conjunctiva/sclera: Conjunctivae normal.  Cardiovascular:     Rate and Rhythm: Normal rate and regular rhythm.     Heart sounds: No murmur. No gallop.   Pulmonary:     Effort: Pulmonary effort is normal. No respiratory distress.     Breath sounds: Normal breath sounds. No stridor. No wheezing or rhonchi.  Abdominal:     General: Abdomen is flat. There is no distension.     Palpations: Abdomen is soft.  Musculoskeletal:        General: No tenderness. Normal range of motion.     Cervical back: Normal range of motion and neck supple.  Skin:    General: Skin is warm and dry.     Coloration: Skin is not pale.     Findings: No erythema or rash.  Neurological:     Mental Status: He is alert and oriented to person, place, and time.  Psychiatric:        Mood and Affect: Mood normal.        Behavior: Behavior normal.        Judgment: Judgment normal.   IMplanted recorder is without tenderness or fluctuance lab Results Lab Results  Component Value Date   WBC 5.2 07/23/2019   HGB 15.4 07/23/2019   HCT 44.4 07/23/2019   MCV 92.5 07/23/2019   PLT 210 07/23/2019    Lab Results  Component Value Date   CREATININE 1.25 (H) 07/23/2019   BUN 11 07/23/2019   NA 138 07/23/2019   K 4.1 07/23/2019   CL 109 07/23/2019   CO2 18 (L) 07/23/2019    Lab Results  Component Value Date   ALT 19 07/08/2019   AST 32 07/08/2019   ALKPHOS 84 07/08/2019   BILITOT 1.3 (H) 07/08/2019     Microbiology: Recent Results (from the past 240 hour(s))  Culture, blood (routine x 2)     Status: None (Preliminary result)   Collection Time: 07/20/19  9:10 PM   Specimen: BLOOD RIGHT ARM  Result Value Ref Range Status    Specimen Description BLOOD RIGHT ARM  Final   Special Requests   Final    BOTTLES DRAWN AEROBIC AND ANAEROBIC Blood Culture adequate volume   Culture   Final    NO GROWTH 3 DAYS Performed at [facility], [place] [address] [place], [place] [address]    Report Status PENDING  Incomplete  Culture, Urine     Status: Abnormal   Collection Time: 07/20/19  9:14 PM   Specimen: Urine, Catheterized  Result Value Ref Range Status   Specimen Description URINE, CATHETERIZED  Final   Special Requests   Final    Normal Performed at [facility], [place] [address] [place], [place] [address]    Culture MULTIPLE SPECIES PRESENT, SUGGEST RECOLLECTION (A)  Final   Report Status 07/21/2019 FINAL  Final  Culture, blood (routine x 2)     Status: None (Preliminary result)   Collection Time: 07/20/19  9:20 PM   Specimen: BLOOD LEFT ARM  Result Value Ref Range Status   Specimen Description BLOOD LEFT ARM  Final   Special Requests   Final    BOTTLES DRAWN AEROBIC AND ANAEROBIC Blood Culture adequate volume   Culture   Final    NO GROWTH 3 DAYS Performed at [facility], [place] [address] [place], [place] [address]    Report Status PENDING  Incomplete  Culture, blood (routine x 2)     Status: None (Preliminary result)   Collection Time: 07/22/19  5:38 PM   Specimen: BLOOD RIGHT ARM  Result Value Ref Range Status   Specimen Description BLOOD RIGHT ARM  Final   Special Requests AEROBIC BOTTLE ONLY Blood Culture adequate volume  Final   Culture   Final    NO GROWTH < 24 HOURS Performed at [facility], [place] [address] [place], [place] [address]    Report Status PENDING  Incomplete  Culture,  blood (routine x 2)     Status: None (Preliminary result)   Collection Time: 07/22/19  5:43 PM   Specimen: BLOOD LEFT HAND  Result Value Ref Range Status   Specimen Description BLOOD LEFT HAND  Final   Special Requests AEROBIC BOTTLE ONLY Blood Culture adequate volume  Final   Culture   Final      NO GROWTH < 24 HOURS Performed at [facility], [place] [address] [place], [place] [address]    Report Status PENDING  Incomplete  Culture, Urine     Status: Abnormal   Collection Time: 07/22/19  9:06 PM   Specimen: Urine, Random  Result Value Ref Range Status   Specimen Description URINE, RANDOM  Final   Special Requests NONE  Final   Culture (A)  Final    <10,000 COLONIES/mL INSIGNIFICANT GROWTH Performed at [facility], [place] [address], [place], [place] [address]    Report Status 07/24/2019 FINAL  Final    Alcide Evener, [place] [facility] [phone] pager  07/24/2019, 10:20 AM

## 2019-07-24 NOTE — Progress Notes (Signed)
Physical Therapy Session Note  Patient Details  Name: Russell Russell MRN: [id number] Date of Birth: [birth year]  Today's Date: 07/24/2019 PT Individual Time: 1100-1158 PT Individual Time Calculation (min): 58 min   Short Term Goals: Week 2:  PT Short Term Goal 1 (Week 2): pt will transfer bed<>chair min A PT Short Term Goal 1 - Progress (Week 2): Met PT Short Term Goal 2 (Week 2): pt will transfer sit<>stand CGA PT Short Term Goal 2 - Progress (Week 2): Progressing toward goal PT Short Term Goal 3 (Week 2): Pt will ambulate 69f with LRAD max A of 1 PT Short Term Goal 3 - Progress (Week 2): Progressing toward goal Week 3:  PT Short Term Goal 1 (Week 3): pt will transfer sit<>stand CGA PT Short Term Goal 2 (Week 3): Pt will ambulate 247fwith LRAD max A of 1 PT Short Term Goal 3 (Week 3): Pt will perform WC mobility 505fSkilled Therapeutic Interventions/Progress Updates:   Received pt sitting in TIS WC, pt agreeable to therapy, and denied any pain during session. Session focused on functional mobility/transfers, LE strength, dynamic sitting/standing balance/coordination, NMR, motor control/planning, and improved activity tolerance. Pt transported to dayroom in TIS WC total assist. Pt transferred sit<>stand<>tall kneeling max A +2. In tall keeling pt worked on midline orientation, L hip extension, weight shifting to R, and dynamic reaching outside BOS towards R side using L UE to grab cups and bring them back across midline to L to stack. Pt required manual facilitation to maintain L hip extension while in tall kneeling using mirror for visual feedback. Pt with increased fatigue and unable to continue to sustain tall kneeling position >10 minutes. Transitioned tall kneeling<>L side sitting<>short sitting with mod A +2. Pt transferred sit<>stand with L UE supported around therapist x 2 trials with mod A and manual facilitation to block L knee buckling with emphasis on maintaining midline  orientation, upright posture, and to avoid pushing to L. Noted pt with soiled brief. Pt transferred mat<>TIS WC max A and transported back to room in WC [name]. Pt transferred TIS WC<>bed mod A and sit<>supine with min A. Doffed soiled brief max A and required total assist for peri-care and max A to don clean brief. Concluded session with pt supine in bed, needs within reach, and bed alarm on.   Therapy Documentation Precautions:  Precautions Precautions: Fall, Other (comment) Precaution Comments: permissive HTN initially (<220/120) then normotension, L sided weakness, aspiration precautions (dysphagia 2 with thins), ; L hemiparesis Restrictions Weight Bearing Restrictions: No  Therapy/Group: Individual Therapy AnnAlfonse Alpers, DPT   07/24/2019, 7:42 AM

## 2019-07-24 NOTE — Progress Notes (Signed)
Pt back to unit from radiology. Delia Heady RN

## 2019-07-24 NOTE — Progress Notes (Signed)
Occupational Therapy Session Note  Patient Details  Name: Russell Russell MRN: [id number] Date of Birth: [birth year]  Today's Date: 07/24/2019 OT Individual Time: 1400-1500 OT Individual Time Calculation (min): 60 min    Short Term Goals: Week 2:  OT Short Term Goal 1 (Week 2): Pt will complete bathing at sit > stand with mod assist OT Short Term Goal 1 - Progress (Week 2): Met OT Short Term Goal 2 (Week 2): Pt will don pants with mod assist sit > stand OT Short Term Goal 2 - Progress (Week 2): Progressing toward goal OT Short Term Goal 3 (Week 2): Pt will complete toilet transfers mod assist 3 out of 5 times to demonstate consistency OT Short Term Goal 3 - Progress (Week 2): Met Week 3:  OT Short Term Goal 1 (Week 3): Pt will don pants with mod assist sit > stand OT Short Term Goal 2 (Week 3): Pt will complete sit > stand with min assist during self-care tasks OT Short Term Goal 3 (Week 3): Pt will complete UB dressing with min assist OT Short Term Goal 4 (Week 3): Pt will complete toilet transfers with mod assist to Rt and Lt  Skilled Therapeutic Interventions/Progress Updates:    1:1 Pt was in bed when arrived with wife present. Pt reported he wasn't going to get up out of the bed and "do therapy." Max encouragement from OT, wife, and rehab tech. Pt continuously disoriented to place, time and people in his room. He could recall he had a CVA but would refer to being at home or at work and didn't recall RN (who was his RN all day). Able to engage pt in [facility] with tasks with grooming items and small pieces of foam. Pt with decreased attention to task and to what his right hand was doing. Pt made multiple mistakes with trying to pick up items with decr awareness. Tasks to incorporate bilateral UEs with min A  To lift left UE against gravity. Pt with decreased visual attention to left hand and left field. Pt then reported he had to use the urinal but unable to hold it for OT to get urinal and voided  all over himself and bed. Pt required max to total A for hygiene and  To come to EOB and transfer into the w/c due to disorganization and decr body awareness. Perform hygiene and changing of clothing at the sink (sit to stand). Pt able to perform sit to stand without use if right hand to decr pushing with mod A. Total A for threading clothing and pull them up. Pt required max A to orient shirt and to begin to don (backwards chaining). Then pt was able to complete the 4 steps with min guard. Pt  Left sitting up in tilt in space chair with chair alarm and wife present.   Therapy Documentation Precautions:  Precautions Precautions: Fall Precaution Comments: permissive HTN initially (<220/120) then normotension, L sided weakness, aspiration precautions (dysphagia 2 with thins), ; L hemiparesis Restrictions Weight Bearing Restrictions: No Pain:  no reports of pain in session   Therapy/Group: Individual Therapy  Willeen Cass [facility] 07/24/2019, 3:54 PM

## 2019-07-25 ENCOUNTER — Inpatient Hospital Stay (HOSPITAL_COMMUNITY): Payer: BC Managed Care – PPO

## 2019-07-25 ENCOUNTER — Encounter (HOSPITAL_COMMUNITY): Payer: Self-pay | Admitting: Physical Medicine & Rehabilitation

## 2019-07-25 LAB — CULTURE, BLOOD (ROUTINE X 2)
Culture: NO GROWTH
Culture: NO GROWTH
Special Requests: ADEQUATE
Special Requests: ADEQUATE

## 2019-07-25 LAB — CK: Total CK: 22 U/L — ABNORMAL LOW (ref 49–397)

## 2019-07-25 LAB — FERRITIN: Ferritin: 409 ng/mL — ABNORMAL HIGH (ref 24–336)

## 2019-07-25 LAB — RPR: RPR Ser Ql: NONREACTIVE

## 2019-07-25 LAB — LACTATE DEHYDROGENASE: LDH: 138 U/L (ref 98–192)

## 2019-07-25 LAB — HIV ANTIBODY (ROUTINE TESTING W REFLEX): HIV Screen 4th Generation wRfx: NONREACTIVE

## 2019-07-25 LAB — SEDIMENTATION RATE: Sed Rate: 75 mm/hr — ABNORMAL HIGH (ref 0–16)

## 2019-07-25 LAB — C-REACTIVE PROTEIN: CRP: 6.6 mg/dL — ABNORMAL HIGH (ref <1.0)

## 2019-07-25 IMAGING — CT CT CHEST W/ CM
2 of 4 series · 15 of 36 positions shown, 18 images · IV contrast (APPLIED)
Comparison: None.

CLINICAL DATA: Fever of unknown origin.

EXAM:
CT CHEST WITH CONTRAST
TECHNIQUE: Multidetector CT imaging of the chest was performed during
intravenous contrast administration.
CONTRAST:  75mL OMNIPAQUE IOHEXOL 300 MG/ML  SOLN

[Series 3: chest w · axial · 0.66mm/px · z∈[-273,-33]mm · 12 of 142 slices shown, 15 images]
[im 11/142  mediastinal]
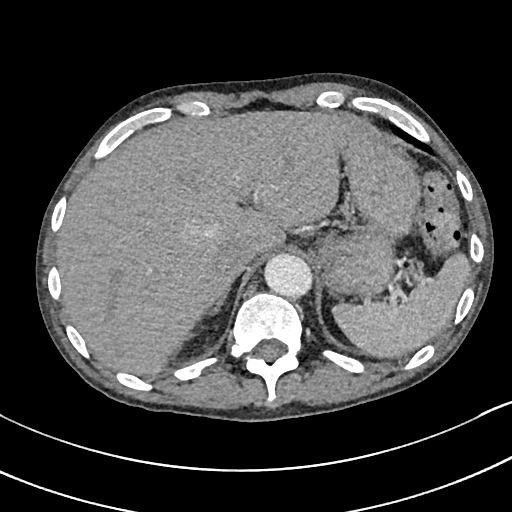
[im 11/142  lung]
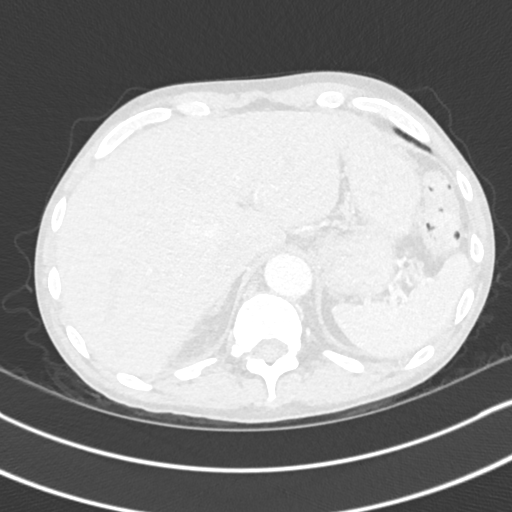
[im 22/142  lung]
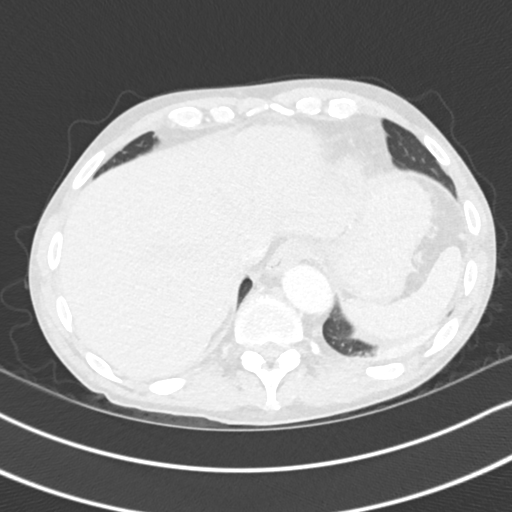
[im 33/142  lung]
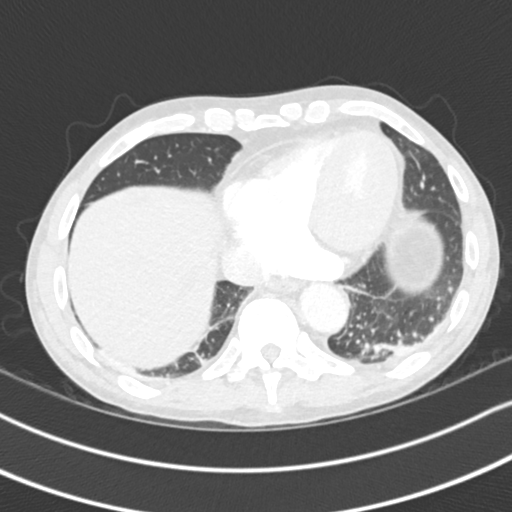
[im 44/142  lung]
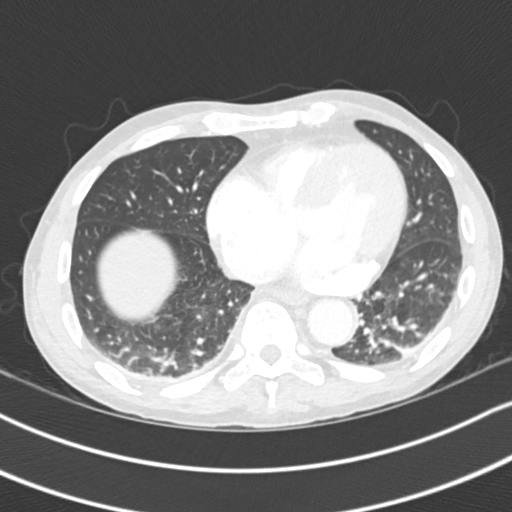
[im 55/142  mediastinal]
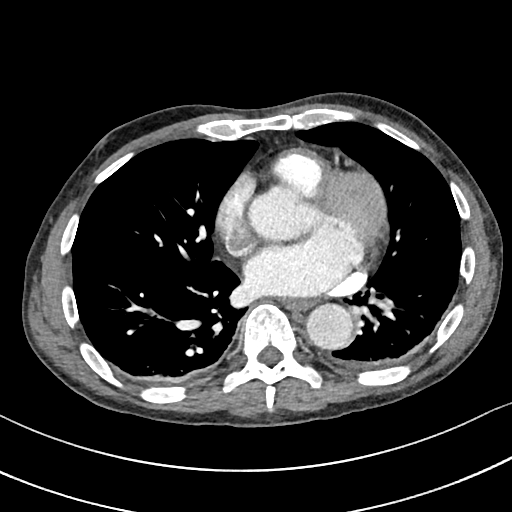
[im 55/142  lung]
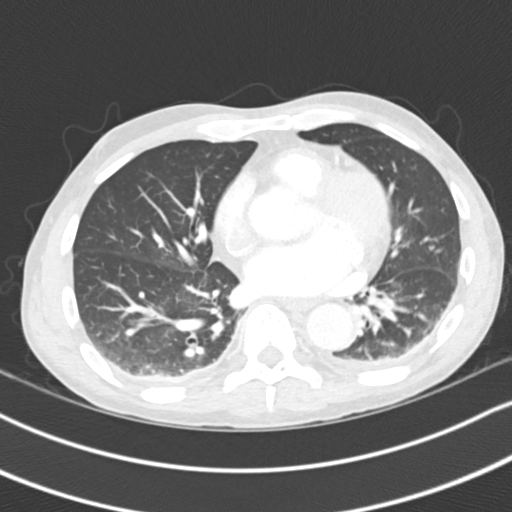
[im 66/142  lung]
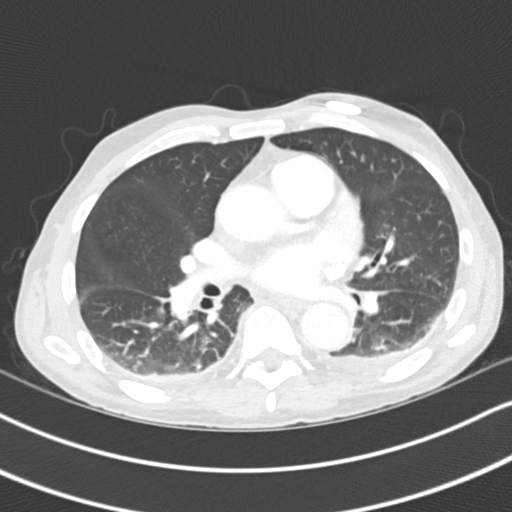
[im 76/142  lung]
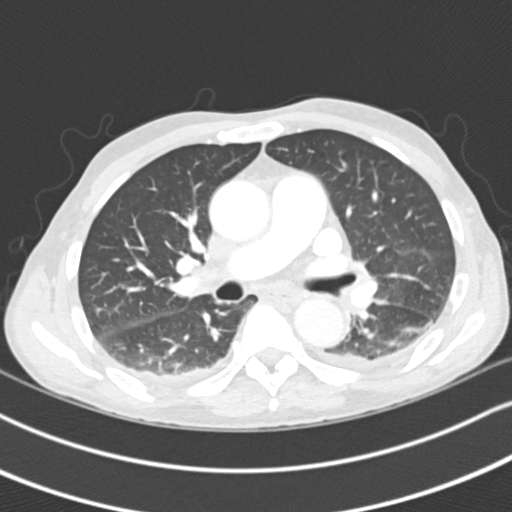
[im 87/142  lung]
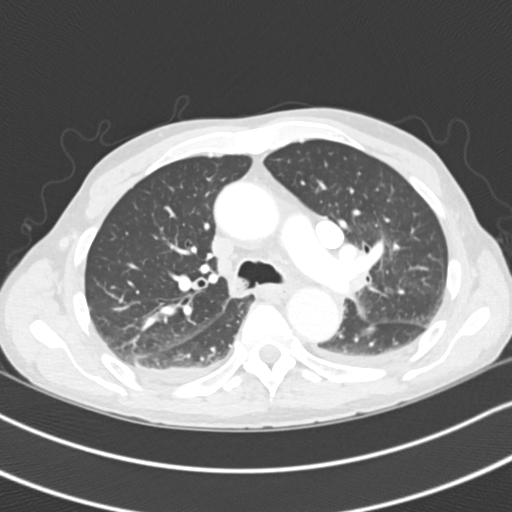
[im 98/142  mediastinal]
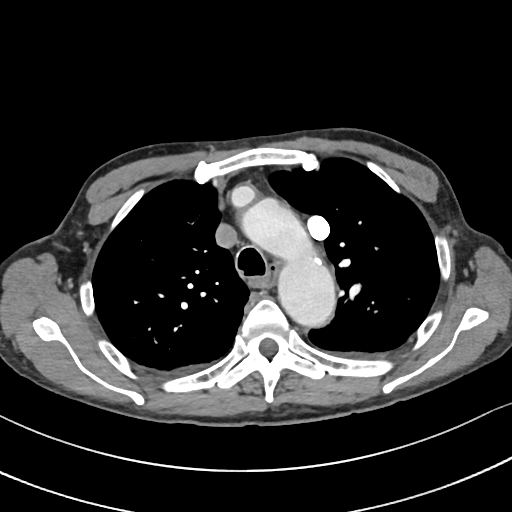
[im 98/142  lung]
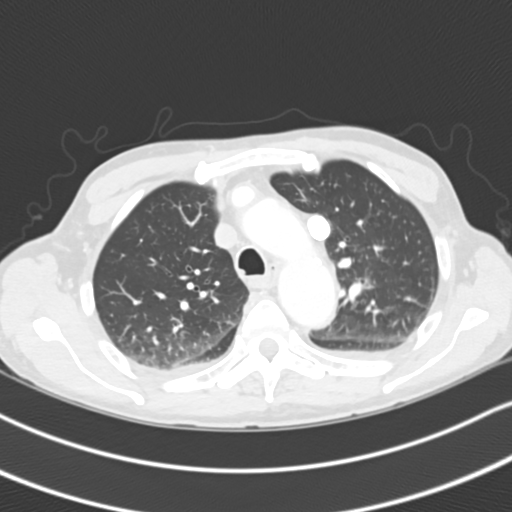
[im 109/142  lung]
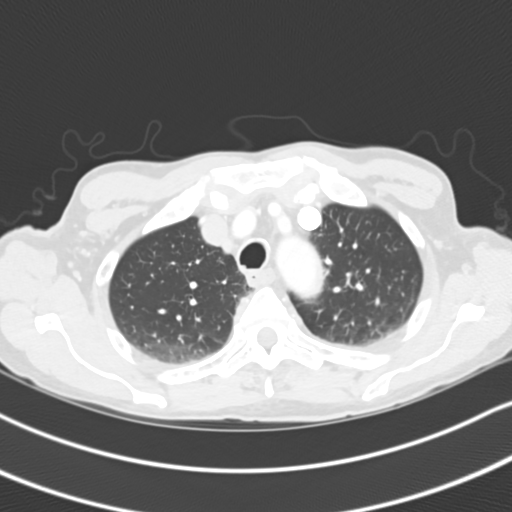
[im 120/142  lung]
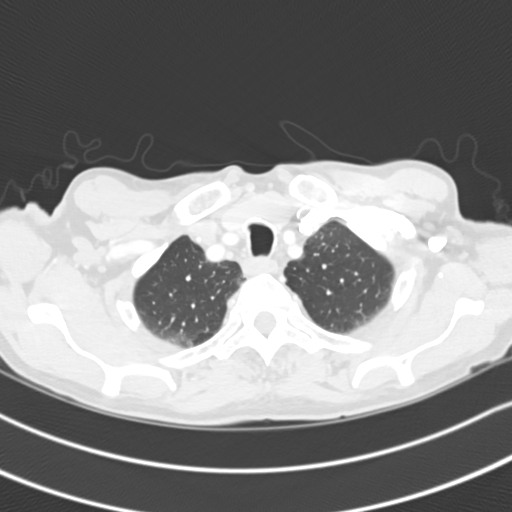
[im 131/142  lung]
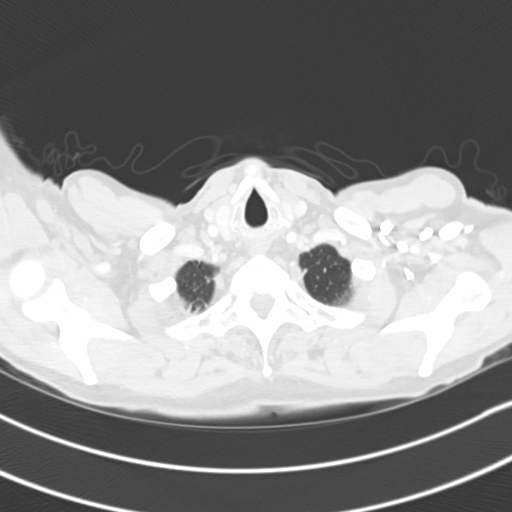

[Series 6: cor · coronal · 0.56mm/px · 3 of 116 slices shown]
[im 24/116  lung]
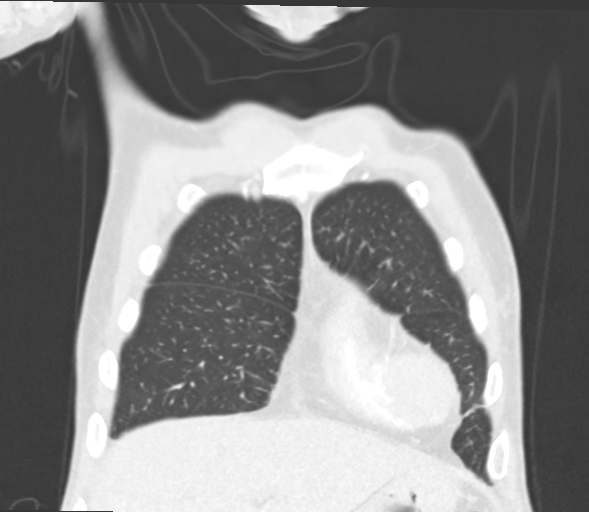
[im 47/116  lung]
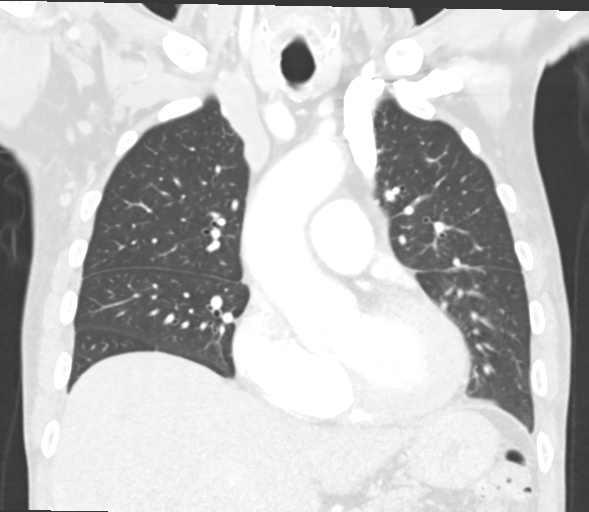
[im 70/116  lung]
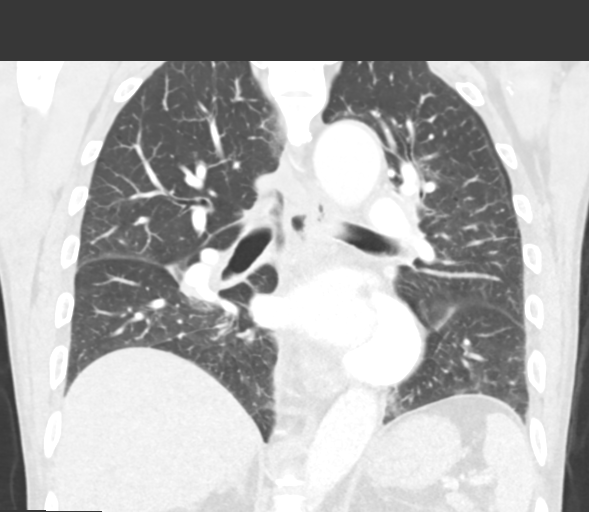

[15 of 36 positions shown; findings below may reference images not displayed]

FINDINGS: Cardiovascular: Ectasia of the ascending thoracic aorta, measuring
3.9 cm diameter. No aortic dissection. No pericardial effusion. Mild
aortic atherosclerosis.

Mediastinum/Nodes: No mass or enlarged lymph nodes within the
mediastinum or perihilar regions. Esophagus is unremarkable.

Lungs/Pleura: Nonobstructing debris within the lower trachea
extending to the upper margin of the RIGHT main bronchus. Trachea
and central bronchi are otherwise unremarkable.

Small bibasilar consolidations, likely atelectasis. Additional mild
dependent atelectasis bilaterally. No evidence of pneumonia. No
evidence of pulmonary edema. No pleural effusion or pneumothorax.

Upper Abdomen: Limited images of the upper abdomen are unremarkable.

Musculoskeletal: No acute or suspicious osseous finding.
IMPRESSION: 1. Small bibasilar consolidations, likely atelectasis. No evidence
of pneumonia or pulmonary edema.
2. Nonobstructing debris within the lower trachea extending to the
upper margin of the RIGHT main bronchus.

Aortic Atherosclerosis ([WK]-[WK]).

## 2019-07-25 MED ORDER — IOHEXOL 300 MG/ML  SOLN
75.0000 mL | Freq: Once | INTRAMUSCULAR | Status: AC | PRN
  Administered 2019-07-25: 75 mL via INTRAVENOUS

## 2019-07-25 NOTE — Progress Notes (Signed)
    [facility] for Infectious Disease   Reason for visit: Follow up on fever  Interval History: CT C/A/P negative for anything acute.  No new events.  Last fever overnight 5/13.  Mildly increased inflammatory markers. Procalcitonin negative.   Physical Exam: Constitutional:  Vitals:   07/24/19 1924 07/25/19 0610  BP: (!) 152/112 (!) 143/107  Pulse: 82 87  Resp: 16 18  Temp: 98.6 F (37 C) 99.1 F (37.3 C)  SpO2: 100% 97%  currently in rehab activities  Impression: fever without a known source  Plan: 1.  Work up so far without obvious cause.  No indication for empiric antibiotics with otherwise stable vital signs.  Will stop cefepime and observe off of antibiotics.   Will continue to monitor work up.

## 2019-07-25 NOTE — Progress Notes (Signed)
Pharmacy Antibiotic Note  Russell Russell is a 69 y.o. male admitted to CIR on 07/07/2019 with L-sided weakness secondary to multiple scattered anterior and posterior R brain infarcts. He presented on 06/30/19 with L hemiparesis; MRI showed multiple scattered acute ischemic infarcts involving the posterior R basal ganglia, R cerebral hemisphere, and R cerebellum.  Pharmacy has been consulted for cefepime dosing for FUO. Today is day 4 abx. ID recommended to d/c abx if CT chest and abdomen are neg.  CT chest: no PNA CT abd: no acute finding  Tm 99.1, WBC down to normal.  Plan: Cefepime 2 g IV q12h - consider d/c since imaging negative Monitor WBC, temp, renal function, clinical improvement, cultures  Height: 5\' 10"  (177.8 cm) Weight: 58.3 kg (128 lb 8 oz) IBW/kg (Calculated) : 73  Temp (24hrs), Avg:98.5 F (36.9 C), Min:97.8 F (36.6 C), Max:99.1 F (37.3 C)  Recent Labs  Lab 07/20/19 1000 07/20/19 2112 07/22/19 0641 07/22/19 1749 07/23/19 0711  WBC 8.7 12.5* 10.6* 7.2 5.2  CREATININE  --  1.18 1.29*  --  1.25*    Estimated Creatinine Clearance: 47.3 mL/min (A) (by C-G formula based on SCr of 1.25 mg/dL (H)).    Allergies  Allergen Reactions  . Penicillins Rash    Antimicrobials this admission: Septra 5/11 >> 5/12 Vanc 5/12 >> 5/14 Cefepime 5/12 >>  Microbiology results: 5/10 UCx >> mult species 5/10 BCx x 2 >> ngtd 5/12 BCx x 2 >> ngtd 5/12 UCx >> insig growth  Thank you for involving pharmacy in this patient's care.  Renold Genta, PharmD, BCPS Clinical Pharmacist 07/25/2019 9:39 AM  **Pharmacist phone directory can be found on [email or web address] listed under [place]**

## 2019-07-25 NOTE — Plan of Care (Signed)
  Problem: Consults Goal: RH STROKE PATIENT EDUCATION Description: See Patient Education module for education specifics  Outcome: Progressing Goal: Nutrition Consult-if indicated Outcome: Progressing   Problem: RH SKIN INTEGRITY Goal: RH STG SKIN FREE OF INFECTION/BREAKDOWN Description: Skin free of breakdown and infection with Mod assist Outcome: Progressing Goal: RH STG MAINTAIN SKIN INTEGRITY WITH ASSISTANCE Description: STG Maintain Skin Integrity With Assistance. MOD Outcome: Progressing   Problem: RH SAFETY Goal: RH STG ADHERE TO SAFETY PRECAUTIONS W/ASSISTANCE/DEVICE Description: STG Adhere to Safety Precautions With Assistance/Device. Mod Outcome: Progressing Goal: RH STG DECREASED RISK OF FALL WITH ASSISTANCE Description: STG Decreased Risk of Fall With Assistance. Mod assist Outcome: Progressing   Problem: RH KNOWLEDGE DEFICIT Goal: RH STG INCREASE KNOWLEDGE OF HYPERTENSION Description: Patient/family will be able to describe management of hypertension including medication, diet, and lifestyle interventions with cues/handouts Outcome: Progressing Goal: RH STG INCREASE KNOWLEDGE OF DYSPHAGIA/FLUID INTAKE Description: Patient/Family will be able to describe and demonstrate safe swallowing techniques and identify foods in D2 diet scope with cues/handouts Outcome: Progressing Goal: RH STG INCREASE KNOWLEGDE OF HYPERLIPIDEMIA Description: Patient/family will be able to describe management of hyperlipidemia including medication, diet, and lifestyle interventions with cues/handouts Outcome: Progressing Goal: RH STG INCREASE KNOWLEDGE OF STROKE PROPHYLAXIS Description: Patient/family will be able to describe stroke prophylaxis including medication, diet, and lifestyle interventions with cues/handouts Outcome: Progressing

## 2019-07-25 NOTE — Progress Notes (Signed)
[facility] PHYSICAL MEDICINE & REHABILITATION PROGRESS NOTE  Subjective/Complaints: No complaints this morning Afebrile. BP 146/103. Flowsheet reviewed. Has been elevated past 3 reads but normotensive 5/14 afternoon  ROS: Denies fevers, chills, CP, SOB, N/V/D  Objective: Vital Signs: Blood pressure (!) 146/103, pulse 74, temperature 98.2 F (36.8 C), resp. rate 20, height 5\' 10"  (1.778 m), weight 58.3 kg, SpO2 99 %. CT CHEST W CONTRAST  Result Date: 07/25/2019 CLINICAL DATA:  Fever of unknown origin. EXAM: CT CHEST WITH CONTRAST TECHNIQUE: Multidetector CT imaging of the chest was performed during intravenous contrast administration. CONTRAST:  64mL OMNIPAQUE IOHEXOL 300 MG/ML  SOLN COMPARISON:  None. FINDINGS: Cardiovascular: Ectasia of the ascending thoracic aorta, measuring 3.9 cm diameter. No aortic dissection. No pericardial effusion. Mild aortic atherosclerosis. Mediastinum/Nodes: No mass or enlarged lymph nodes within the mediastinum or perihilar regions. Esophagus is unremarkable. Lungs/Pleura: Nonobstructing debris within the lower trachea extending to the upper margin of the RIGHT main bronchus. Trachea and central bronchi are otherwise unremarkable. Small bibasilar consolidations, likely atelectasis. Additional mild dependent atelectasis bilaterally. No evidence of pneumonia. No evidence of pulmonary edema. No pleural effusion or pneumothorax. Upper Abdomen: Limited images of the upper abdomen are unremarkable. Musculoskeletal: No acute or suspicious osseous finding. IMPRESSION: 1. Small bibasilar consolidations, likely atelectasis. No evidence of pneumonia or pulmonary edema. 2. Nonobstructing debris within the lower trachea extending to the upper margin of the RIGHT main bronchus. Aortic Atherosclerosis (ICD10-I70.0). Electronically Signed   By: Franki Cabot M.D.   On: 07/25/2019 05:27   CT ABDOMEN PELVIS W CONTRAST  Result Date: 07/24/2019 CLINICAL DATA:  Fever of unknown origin  EXAM: CT ABDOMEN AND PELVIS WITH CONTRAST TECHNIQUE: Multidetector CT imaging of the abdomen and pelvis was performed using the standard protocol following bolus administration of intravenous contrast. CONTRAST:  179mL OMNIPAQUE IOHEXOL 300 MG/ML  SOLN COMPARISON:  09/13/2014 FINDINGS: Lower chest: Bibasilar atelectasis.  Trace left pleural effusion. Hepatobiliary: No focal hepatic abnormality. Gallbladder unremarkable. Pancreas: No focal abnormality or ductal dilatation. Spleen: No focal abnormality.  Normal size. Adrenals/Urinary Tract: Atrophic left kidney. Small bilateral renal cysts. No hydronephrosis. Adrenal glands and urinary bladder unremarkable. Stomach/Bowel: Normal appendix. Moderate stool throughout the colon. Stomach, large and small bowel grossly unremarkable. Vascular/Lymphatic: Aortic atherosclerosis. No enlarged abdominal or pelvic lymph nodes. Reproductive: Prostate enlargement. Other: No free fluid or free air. Musculoskeletal: No acute bony abnormality. IMPRESSION: Trace left pleural effusion. Atrophic left kidney, stable since prior study. Aortic atherosclerosis. Moderate stool burden throughout the colon. Prostate enlargement. No acute findings. Electronically Signed   By: Rolm Baptise M.D.   On: 07/24/2019 15:34   ECHOCARDIOGRAM COMPLETE  Result Date: 07/24/2019    ECHOCARDIOGRAM REPORT   Patient Name:   Russell Russell Date of Exam: 07/24/2019 Medical Rec #:  [id number]      Height:       70.0 in Accession #:    [id number]     Weight:       128.5 lb Date of Birth:  03/10/1951     BSA:          1.730 m Patient Age:    69 years       BP:           128/98 mmHg Patient Gender: M              HR:           85 bpm. Exam Location:  Inpatient Procedure: 2D Echo Indications:    Fever 780.6 /  R50.9  History:        Patient has prior history of Echocardiogram examinations, most                 recent 07/01/2019. Stroke; Risk Factors:Hypertension,                 Dyslipidemia and Current Smoker. GERD.   Sonographer:    Darlina Sicilian RDCS Referring Phys: [place]  1. Left ventricular ejection fraction, by estimation, is 60 to 65%. The left ventricle has normal function. The left ventricle has no regional wall motion abnormalities. There is moderate left ventricular hypertrophy of the basal-septal segment. Left ventricular diastolic parameters were normal.  2. Right ventricular systolic function is normal. The right ventricular size is normal. There is mildly elevated pulmonary artery systolic pressure. The estimated right ventricular systolic pressure is Q000111Q mmHg.  3. The mitral valve is normal in structure. Trivial mitral valve regurgitation. No evidence of mitral stenosis.  4. The aortic valve is normal in structure. Aortic valve regurgitation is not visualized. No aortic stenosis is present.  5. The inferior vena cava is dilated in size with <50% respiratory variability, suggesting right atrial pressure of 15 mmHg. Conclusion(s)/Recommendation(s): No evidence of valvular vegetations on this transthoracic echocardiogram. Would recommend a transesophageal echocardiogram to exclude infective endocarditis if clinically indicated. FINDINGS  Left Ventricle: Left ventricular ejection fraction, by estimation, is 60 to 65%. The left ventricle has normal function. The left ventricle has no regional wall motion abnormalities. The left ventricular internal cavity size was normal in size. There is  moderate left ventricular hypertrophy of the basal-septal segment. Left ventricular diastolic parameters were normal. Normal left ventricular filling pressure. Right Ventricle: The right ventricular size is normal. No increase in right ventricular wall thickness. Right ventricular systolic function is normal. There is mildly elevated pulmonary artery systolic pressure. The tricuspid regurgitant velocity is 2.27  m/s, and with an assumed right atrial pressure of 15 mmHg, the estimated right ventricular  systolic pressure is Q000111Q mmHg. Left Atrium: Left atrial size was normal in size. Right Atrium: Right atrial size was normal in size. Pericardium: There is no evidence of pericardial effusion. Mitral Valve: The mitral valve is normal in structure. Normal mobility of the mitral valve leaflets. Trivial mitral valve regurgitation. No evidence of mitral valve stenosis. Tricuspid Valve: The tricuspid valve is normal in structure. Tricuspid valve regurgitation is trivial. No evidence of tricuspid stenosis. Aortic Valve: The aortic valve is normal in structure. Aortic valve regurgitation is not visualized. No aortic stenosis is present. Pulmonic Valve: The pulmonic valve was normal in structure. Pulmonic valve regurgitation is not visualized. No evidence of pulmonic stenosis. Aorta: The aortic root is normal in size and structure. Venous: The inferior vena cava is dilated in size with less than 50% respiratory variability, suggesting right atrial pressure of 15 mmHg. IAS/Shunts: No atrial level shunt detected by color flow Doppler.  LEFT VENTRICLE PLAX 2D LVIDd:         4.67 cm  Diastology LVIDs:         2.98 cm  LV e' lateral:   10.10 cm/s LV PW:         1.00 cm  LV E/e' lateral: 5.5 LV IVS:        1.46 cm  LV e' medial:    6.74 cm/s LVOT diam:     2.30 cm  LV E/e' medial:  8.3 LV SV:  62 LV SV Index:   36 LVOT Area:     4.15 cm  RIGHT VENTRICLE RV S prime:     15.40 cm/s TAPSE (M-mode): 2.1 cm LEFT ATRIUM           Index       RIGHT ATRIUM           Index LA diam:      3.70 cm 2.14 cm/m  RA Area:     16.70 cm LA Vol (A4C): 41.4 ml 23.94 ml/m RA Volume:   43.40 ml  25.09 ml/m  AORTIC VALVE LVOT Vmax:   65.90 cm/s LVOT Vmean:  52.200 cm/s LVOT VTI:    0.150 m MITRAL VALVE               TRICUSPID VALVE MV Area (PHT): 5.54 cm    TR Peak grad:   20.6 mmHg MV Decel Time: 137 msec    TR Vmax:        227.00 cm/s MV E velocity: 55.70 cm/s MV A velocity: 44.10 cm/s  SHUNTS MV E/A ratio:  1.26        Systemic VTI:   0.15 m                            Systemic Diam: 2.30 cm Fransico Him MD Electronically signed by Fransico Him MD Signature Date/Time: 07/24/2019/2:10:53 PM    Final    VAS [place] UPPER EXTREMITY VENOUS DUPLEX  Result Date: 07/24/2019 UPPER VENOUS STUDY  Indications: fever Performing Technologist: June Leap RDMS, RVT  Examination Guidelines: A complete evaluation includes B-mode imaging, spectral Doppler, color Doppler, and power Doppler as needed of all accessible portions of each vessel. Bilateral testing is considered an integral part of a complete examination. Limited examinations for reoccurring indications may be performed as noted.  Right Findings: +----------+------------+---------+-----------+----------+-------+ RIGHT     CompressiblePhasicitySpontaneousPropertiesSummary +----------+------------+---------+-----------+----------+-------+ IJV           Full       Yes       Yes                      +----------+------------+---------+-----------+----------+-------+ Subclavian    Full       Yes       Yes                      +----------+------------+---------+-----------+----------+-------+ Axillary      Full       Yes       Yes                      +----------+------------+---------+-----------+----------+-------+ Brachial      Full                                          +----------+------------+---------+-----------+----------+-------+ Radial        Full                                          +----------+------------+---------+-----------+----------+-------+ Ulnar         Full                                          +----------+------------+---------+-----------+----------+-------+  Cephalic      Full                                          +----------+------------+---------+-----------+----------+-------+ Basilic       Full                                          +----------+------------+---------+-----------+----------+-------+  Left  Findings: +----------+------------+---------+-----------+----------+--------------+ LEFT      CompressiblePhasicitySpontaneousProperties   Summary     +----------+------------+---------+-----------+----------+--------------+ IJV           Full       Yes       Yes                             +----------+------------+---------+-----------+----------+--------------+ Subclavian    Full       Yes       Yes                             +----------+------------+---------+-----------+----------+--------------+ Axillary      Full       Yes       Yes                             +----------+------------+---------+-----------+----------+--------------+ Brachial      Full                                                 +----------+------------+---------+-----------+----------+--------------+ Radial        Full                                                 +----------+------------+---------+-----------+----------+--------------+ Ulnar         Full                                                 +----------+------------+---------+-----------+----------+--------------+ Cephalic                                            Not visualized +----------+------------+---------+-----------+----------+--------------+ Basilic       Full                                                 +----------+------------+---------+-----------+----------+--------------+  Summary: No evidence of deep vein or superficial vein thrombosis involving the right and left upper extremities.  *See table(s) above for measurements and observations.    Preliminary    Recent Labs    07/22/19 1749 07/23/19 0711  WBC 7.2 5.2  HGB 14.3 15.4  HCT 42.2 44.4  PLT 221 210   Recent Labs    07/23/19 0711  NA 138  K 4.1  CL 109  CO2 18*  GLUCOSE 106*  BUN 11  CREATININE 1.25*  CALCIUM 8.9    Physical Exam: BP (!) 146/103 (BP Location: Right Arm)   Pulse 74   Temp 98.2 F (36.8 C)   Resp  20   Ht 5\' 10"  (1.778 m)   Wt 58.3 kg   SpO2 99%   BMI 18.44 kg/m  Constitutional: No distress . Vital signs reviewed. Lying in bed comfortably HENT: Normocephalic.  Atraumatic. Eyes: EOMI. No discharge. Cardiovascular: No JVD. Respiratory: Normal effort.  No stridor. GI: Non-distended. Skin: Warm and dry.  Intact. Psych: Normal mood.  Normal behavior. Musc: No edema in extremities.  No tenderness in extremities. Neuro: Alert Motor: Left upper extremity: Shoulder abduction 4/5, distally 4/5, with apraxia and ataxia  Left lower extremity: Hip flexion 3-/5, knee extension 3/5, ankle dorsiflexion 4/5 Dysarthria, stable Left facial weakness, stable.   Assessment/Plan: 1. Functional deficits secondary to multiple anterior and posterior right brain infarcts which require 3+ hours per day of interdisciplinary therapy in a comprehensive inpatient rehab setting.  Physiatrist is providing close team supervision and 24 hour management of active medical problems listed below.  Physiatrist and rehab team continue to assess barriers to discharge/monitor patient progress toward functional and medical goals  Care Tool:  Bathing    Body parts bathed by patient: Front perineal area   Body parts bathed by helper: Buttocks     Bathing assist Assist Level: Maximal Assistance - Patient 24 - 49%     Upper Body Dressing/Undressing Upper body dressing   What is the patient wearing?: Pull over shirt    Upper body assist Assist Level: Minimal Assistance - Patient > 75%    Lower Body Dressing/Undressing Lower body dressing      What is the patient wearing?: Pants, Incontinence brief     Lower body assist Assist for lower body dressing: 2 Helpers     Toileting Toileting Toileting Activity did not occur (Clothing management and hygiene only): N/A (no void or bm)  Toileting assist Assist for toileting: 2 Helpers     Transfers Chair/bed transfer  Transfers assist     Chair/bed  transfer assist level: Moderate Assistance - Patient 50 - 74%     Locomotion Ambulation   Ambulation assist   Ambulation activity did not occur: Safety/medical concerns(L hemi, decreased postural control/balance, fatigue)  Assist level: 2 helpers Assistive device: Other (comment)(wall on R side without rail) Max distance: 5 ft   Walk 10 feet activity   Assist  Walk 10 feet activity did not occur: Safety/medical concerns(L hemi, decreased postural control/balance, fatigue)  Assist level: 2 helpers Assistive device: Lite Gait   Walk 50 feet activity   Assist Walk 50 feet with 2 turns activity did not occur: Safety/medical concerns(L hemi, decreased postural control/balance, fatigue)         Walk 150 feet activity   Assist Walk 150 feet activity did not occur: Safety/medical concerns(L hemi, decreased postural control/balance, fatigue)         Walk 10 feet on uneven surface  activity   Assist Walk 10 feet on uneven surfaces activity did not occur: Safety/medical concerns(L hemi, decreased postural control/balance, fatigue)         Wheelchair     Assist Will patient use wheelchair at discharge?: Yes Type of Wheelchair: Agricultural engineer  assist level: Dependent - Patient 0%      Wheelchair 50 feet with 2 turns activity    Assist        Assist Level: Dependent - Patient 0%   Wheelchair 150 feet activity     Assist     Assist Level: Dependent - Patient 0%      Medical Problem List and Plan: 1.  Left side weakness secondary to multiple scattered anterior and posterior right brain infarcts.  Status post loop recorder  Continue CIR  Repeat head CT personally reviewed, unchanged  Discussed with therapies-overall improving function since abx 2.  Antithrombotics: -DVT/anticoagulation: Lovenox             -antiplatelet therapy: Aspirin 325 mg daily and Plavix 75 mg daily x3 months then aspirin alone 3. Pain Management: Tylenol as  needed. Well controlled 4. Mood: Provide emotional support             -antipsychotic agents: N/A 5. Neuropsych: This patient is ?fully capable of making decisions on his own behalf. 6. Skin/Wound Care: Routine skin checks 7. Fluids/Electrolytes/Nutrition: Routine in and outs. 8.  Hypertension.  Lopressor 12.5 mg twice daily, increased to 25 on 5/8.    Lisinopril 2.5 qhs started on 5/5  Elevated diastolic pressures on 0000000  5/15: BP 146/103. Flowsheet reviewed. Has been elevated past 3 reads but normotensive 5/14 afternoon  Monitor with increased mobility 9.  Hyperlipidemia. Lipitor 10.  Dysphagia.  Dysphagia #3 thins liquids.  Follow-up speech therapy  Cont to advance diet as tolerated 11.  History of tobacco and alcohol use.  NicoDerm patch.  Provide counseling.    Monitor for withdrawal 12.  Moderate hypoalbuminemia  Supplement initiated on 4/28 13.  WBCs  WBCs 5.2 on 5/13, labs ordered for Monday  Cont to monitor 13.  AKI  Echocardiogram reviewed, EF ~60%  Continue IVF  Creatinine 1.25 on 5/13, labs ordered for Monday 14.  Sepsis  See #12  Febrile again  UA suspicious, urine culture multiple species; repeat UA suspicious again, urine culture insignificant growth  Blood cultures NGTD; repeat blood cultures NGTD  Chest x-ray personally reviewed again, unremarkable for infection  Influenza negative  Covid negative  Empiric Septra d/ced, IV Vanco and cefepime initiated  Procalcitonin within normal limits on 5/14  ID consulted  5/15 CT chest reviewed and shows no evidence of pneumonia, +atelectasis   LOS: 18 days A FACE TO FACE EVALUATION WAS PERFORMED  Martha Clan P Cadel Stairs 07/25/2019, 2:17 PM

## 2019-07-26 ENCOUNTER — Inpatient Hospital Stay (HOSPITAL_COMMUNITY): Payer: BC Managed Care – PPO | Admitting: Occupational Therapy

## 2019-07-26 LAB — MPO/PR-3 (ANCA) ANTIBODIES
ANCA Proteinase 3: <3.5 U/mL (ref 0.0–3.5)
Myeloperoxidase Abs: <9 U/mL (ref 0.0–9.0)

## 2019-07-26 LAB — CMV ANTIBODY, IGG (EIA): CMV Ab - IgG: 9.1 U/mL — ABNORMAL HIGH (ref 0.00–0.59)

## 2019-07-26 LAB — CMV IGM: CMV IgM: <30 AU/mL (ref 0.0–29.9)

## 2019-07-26 MED ORDER — LISINOPRIL 5 MG PO TABS
2.5000 mg | ORAL_TABLET | Freq: Every day | ORAL | Status: DC
  Administered 2019-07-26 – 2019-07-28 (×3): 2.5 mg via ORAL
  Filled 2019-07-26 (×4): qty 1

## 2019-07-26 NOTE — Progress Notes (Signed)
[facility] PHYSICAL MEDICINE & REHABILITATION PROGRESS NOTE  Subjective/Complaints: No complaints this morning Afebrile. BP 156/119.  Wife at bedside  ROS: Denies fevers, chills, CP, SOB, N/V/D  Objective: Vital Signs: Blood pressure (!) 156/119, pulse 78, temperature 97.9 F (36.6 C), resp. rate 17, height 5\' 10"  (1.778 m), weight 58.3 kg, SpO2 100 %. CT CHEST W CONTRAST  Result Date: 07/25/2019 CLINICAL DATA:  Fever of unknown origin. EXAM: CT CHEST WITH CONTRAST TECHNIQUE: Multidetector CT imaging of the chest was performed during intravenous contrast administration. CONTRAST:  36mL OMNIPAQUE IOHEXOL 300 MG/ML  SOLN COMPARISON:  None. FINDINGS: Cardiovascular: Ectasia of the ascending thoracic aorta, measuring 3.9 cm diameter. No aortic dissection. No pericardial effusion. Mild aortic atherosclerosis. Mediastinum/Nodes: No mass or enlarged lymph nodes within the mediastinum or perihilar regions. Esophagus is unremarkable. Lungs/Pleura: Nonobstructing debris within the lower trachea extending to the upper margin of the RIGHT main bronchus. Trachea and central bronchi are otherwise unremarkable. Small bibasilar consolidations, likely atelectasis. Additional mild dependent atelectasis bilaterally. No evidence of pneumonia. No evidence of pulmonary edema. No pleural effusion or pneumothorax. Upper Abdomen: Limited images of the upper abdomen are unremarkable. Musculoskeletal: No acute or suspicious osseous finding. IMPRESSION: 1. Small bibasilar consolidations, likely atelectasis. No evidence of pneumonia or pulmonary edema. 2. Nonobstructing debris within the lower trachea extending to the upper margin of the RIGHT main bronchus. Aortic Atherosclerosis (ICD10-I70.0). Electronically Signed   By: Franki Cabot M.D.   On: 07/25/2019 05:27   CT ABDOMEN PELVIS W CONTRAST  Result Date: 07/24/2019 CLINICAL DATA:  Fever of unknown origin EXAM: CT ABDOMEN AND PELVIS WITH CONTRAST TECHNIQUE: Multidetector  CT imaging of the abdomen and pelvis was performed using the standard protocol following bolus administration of intravenous contrast. CONTRAST:  119mL OMNIPAQUE IOHEXOL 300 MG/ML  SOLN COMPARISON:  09/13/2014 FINDINGS: Lower chest: Bibasilar atelectasis.  Trace left pleural effusion. Hepatobiliary: No focal hepatic abnormality. Gallbladder unremarkable. Pancreas: No focal abnormality or ductal dilatation. Spleen: No focal abnormality.  Normal size. Adrenals/Urinary Tract: Atrophic left kidney. Small bilateral renal cysts. No hydronephrosis. Adrenal glands and urinary bladder unremarkable. Stomach/Bowel: Normal appendix. Moderate stool throughout the colon. Stomach, large and small bowel grossly unremarkable. Vascular/Lymphatic: Aortic atherosclerosis. No enlarged abdominal or pelvic lymph nodes. Reproductive: Prostate enlargement. Other: No free fluid or free air. Musculoskeletal: No acute bony abnormality. IMPRESSION: Trace left pleural effusion. Atrophic left kidney, stable since prior study. Aortic atherosclerosis. Moderate stool burden throughout the colon. Prostate enlargement. No acute findings. Electronically Signed   By: Rolm Baptise M.D.   On: 07/24/2019 15:34   No results for input(s): WBC, HGB, HCT, PLT in the last 72 hours. No results for input(s): NA, K, CL, CO2, GLUCOSE, BUN, CREATININE, CALCIUM in the last 72 hours.  Physical Exam: BP (!) 156/119 (BP Location: Right Arm)   Pulse 78   Temp 97.9 F (36.6 C)   Resp 17   Ht 5\' 10"  (1.778 m)   Wt 58.3 kg   SpO2 100%   BMI 18.44 kg/m  Constitutional: No distress . Vital signs reviewed. Lying in bed comfortably. Wife at bedside.  HENT: Normocephalic.  Atraumatic. Eyes: EOMI. No discharge. Cardiovascular: No JVD. Respiratory: Normal effort.  No stridor. GI: Non-distended. Skin: Warm and dry.  Intact. Psych: Normal mood.  Normal behavior.  Musc: No edema in extremities.  No tenderness in extremities. Neuro: Alert Motor: Left upper  extremity: Shoulder abduction 4/5, distally 4/5, with apraxia and ataxia  Left lower extremity: Hip flexion 3-/5, knee extension  3/5, ankle dorsiflexion 4/5 Dysarthria, stable Left facial weakness, stable.   Assessment/Plan: 1. Functional deficits secondary to multiple anterior and posterior right brain infarcts which require 3+ hours per day of interdisciplinary therapy in a comprehensive inpatient rehab setting.  Physiatrist is providing close team supervision and 24 hour management of active medical problems listed below.  Physiatrist and rehab team continue to assess barriers to discharge/monitor patient progress toward functional and medical goals  Care Tool:  Bathing    Body parts bathed by patient: Front perineal area, Buttocks   Body parts bathed by helper: Buttocks     Bathing assist Assist Level: Moderate Assistance - Patient 50 - 74%     Upper Body Dressing/Undressing Upper body dressing   What is the patient wearing?: Pull over shirt    Upper body assist Assist Level: Minimal Assistance - Patient > 75%    Lower Body Dressing/Undressing Lower body dressing      What is the patient wearing?: Pants, Incontinence brief     Lower body assist Assist for lower body dressing: Moderate Assistance - Patient 50 - 74%     Toileting Toileting Toileting Activity did not occur (Clothing management and hygiene only): N/A (no void or bm)  Toileting assist Assist for toileting: 2 Helpers     Transfers Chair/bed transfer  Transfers assist     Chair/bed transfer assist level: Moderate Assistance - Patient 50 - 74%     Locomotion Ambulation   Ambulation assist   Ambulation activity did not occur: Safety/medical concerns(L hemi, decreased postural control/balance, fatigue)  Assist level: 2 helpers Assistive device: Other (comment)(wall on R side without rail) Max distance: 5 ft   Walk 10 feet activity   Assist  Walk 10 feet activity did not occur:  Safety/medical concerns(L hemi, decreased postural control/balance, fatigue)  Assist level: 2 helpers Assistive device: Lite Gait   Walk 50 feet activity   Assist Walk 50 feet with 2 turns activity did not occur: Safety/medical concerns(L hemi, decreased postural control/balance, fatigue)         Walk 150 feet activity   Assist Walk 150 feet activity did not occur: Safety/medical concerns(L hemi, decreased postural control/balance, fatigue)         Walk 10 feet on uneven surface  activity   Assist Walk 10 feet on uneven surfaces activity did not occur: Safety/medical concerns(L hemi, decreased postural control/balance, fatigue)         Wheelchair     Assist Will patient use wheelchair at discharge?: Yes Type of Wheelchair: Manual    Wheelchair assist level: Dependent - Patient 0%      Wheelchair 50 feet with 2 turns activity    Assist        Assist Level: Dependent - Patient 0%   Wheelchair 150 feet activity     Assist     Assist Level: Dependent - Patient 0%      Medical Problem List and Plan: 1.  Left side weakness secondary to multiple scattered anterior and posterior right brain infarcts.  Status post loop recorder  Continue CIR  Repeat head CT personally reviewed, unchanged  Discussed with therapies-overall improving function since abx 2.  Antithrombotics: -DVT/anticoagulation: Lovenox             -antiplatelet therapy: Aspirin 325 mg daily and Plavix 75 mg daily x3 months then aspirin alone 3. Pain Management: Tylenol as needed. Well controlled 4. Mood: Provide emotional support             -  antipsychotic agents: N/A 5. Neuropsych: This patient is ?fully capable of making decisions on his own behalf. 6. Skin/Wound Care: Routine skin checks 7. Fluids/Electrolytes/Nutrition: Routine in and outs. 8.  Hypertension.  Lopressor 12.5 mg twice daily, increased to 25 on 5/8.    5/16: start Lisinopril 2.5mg  daily  Monitor with  increased mobility 9.  Hyperlipidemia. Lipitor 10.  Dysphagia.  Dysphagia #3 thins liquids.  Follow-up speech therapy  Cont to advance diet as tolerated 11.  History of tobacco and alcohol use.  NicoDerm patch.  Provide counseling.    Monitor for withdrawal 12.  Moderate hypoalbuminemia  Supplement initiated on 4/28 13.  WBCs  WBCs 5.2 on 5/13, labs ordered for Monday  Cont to monitor 13.  AKI  Echocardiogram reviewed, EF ~60%  Continue IVF  Creatinine 1.25 on 5/13, labs ordered for Monday 14.  Sepsis  See #12  Febrile again  UA suspicious, urine culture multiple species; repeat UA suspicious again, urine culture insignificant growth  Blood cultures NGTD; repeat blood cultures NGTD  Chest x-ray personally reviewed again, unremarkable for infection  Influenza negative  Covid negative  Empiric Septra d/ced, IV Vanco and cefepime initiated  Procalcitonin within normal limits on 5/14  ID consulted  5/15 CT chest reviewed and shows no evidence of pneumonia, +atelectasis 15. Active smoker: continue nicotine patch. Reinforced importance of stopping smoking (has been asking wife for cigarettes). 16. Active alcohol use: Reinforced the importance of stopping alcohol use (has been asking wife for beer).    LOS: 19 days A FACE TO FACE EVALUATION WAS PERFORMED  Russell Russell P Russell Russell 07/26/2019, 1:17 PM

## 2019-07-26 NOTE — Progress Notes (Signed)
Occupational Therapy Session Note  Patient Details  Name: Russell Russell MRN: [id number] Date of Birth: 30-Sep-1950  Today's Date: 07/26/2019 OT Individual Time: [id number] Individual Time Calculation (min): 60 min    Short Term Goals: Week 3:  OT Short Term Goal 1 (Week 3): Pt will don pants with mod assist sit > stand OT Short Term Goal 2 (Week 3): Pt will complete sit > stand with min assist during self-care tasks OT Short Term Goal 3 (Week 3): Pt will complete UB dressing with min assist OT Short Term Goal 4 (Week 3): Pt will complete toilet transfers with mod assist to Rt and Lt  Skilled Therapeutic Interventions/Progress Updates:    Treatment session with focus on functional transfers, dynamic sitting and standing balance, and LUE NMR during self-care tasks and therapeutic activities.  Pt received supine in bed agreeable to therapy session.  Pt oriented to place and month, not oriented to day of week.  Completed bed mobility with min assist to come to sitting EOB.  Pt with improved sitting balance this session, providing CGA during all sitting tasks.  Completed squat pivot transfer to w/c Mod assist with pt demonstrating improved weight shifting and decreased pushing.  Engaged in bathing at sit > stand level at sink with focus on dynamic standing balance while completing LB bathing.  Pt demonstrating improved standing balance with ability to wash perineal area and buttocks while therapist providing mod assist for standing balance due to moderate pushing with increased activity challenge.  Utilized Geologist, engineering for visual feedback for standing balance and educated on WB through LUE on sink when not in active use.  Pt completed LB dressing with min assist for dressing and mod assist for standing balance when pulling pants over hips.    Engaged in squat pivot transfers with mod assist w/c <> therapy mat with improved weight shift.  Engaged in dynamic sitting and standing balance at edge of mat with  focus on trunk control and awareness of midline.  Engaged in [place] in sitting while Lt hand on ball and Rt hand on top to facilitate increased ROM and weight shifting when moving large therapy ball in all directions.  Increased challenge to incorporate LUE into active reaching with horseshoes and cards.  Incorporated mild cognitive challenge with sequencing and matching, pt requiring cues 50% of time for recall.  Pt maintaining midline sitting balance with CGA during dynamic tasks and up to mod assist during dynamic standing.  Returned to w/c and left tilted back in TIS with seat belt alarm on and all needs in reach.  Therapy Documentation Precautions:  Precautions Precautions: Fall Precaution Comments: permissive HTN initially (<220/120) then normotension, L sided weakness, aspiration precautions (dysphagia 2 with thins), ; L hemiparesis Restrictions Weight Bearing Restrictions: No Pain: Pain Assessment Pain Scale: 0-10 Pain Score: 0-No pain   Therapy/Group: Individual Therapy  [name] 07/26/2019, 9:18 AM

## 2019-07-26 NOTE — Plan of Care (Signed)
  Problem: Consults Goal: RH STROKE PATIENT EDUCATION Description: See Patient Education module for education specifics  Outcome: Progressing Goal: Nutrition Consult-if indicated Outcome: Progressing   Problem: RH SKIN INTEGRITY Goal: RH STG SKIN FREE OF INFECTION/BREAKDOWN Description: Skin free of breakdown and infection with Mod assist Outcome: Progressing Goal: RH STG MAINTAIN SKIN INTEGRITY WITH ASSISTANCE Description: STG Maintain Skin Integrity With Assistance. MOD Outcome: Progressing   Problem: RH SAFETY Goal: RH STG ADHERE TO SAFETY PRECAUTIONS W/ASSISTANCE/DEVICE Description: STG Adhere to Safety Precautions With Assistance/Device. Mod Outcome: Progressing Goal: RH STG DECREASED RISK OF FALL WITH ASSISTANCE Description: STG Decreased Risk of Fall With Assistance. Mod assist Outcome: Progressing   Problem: RH KNOWLEDGE DEFICIT Goal: RH STG INCREASE KNOWLEDGE OF HYPERTENSION Description: Patient/family will be able to describe management of hypertension including medication, diet, and lifestyle interventions with cues/handouts Outcome: Progressing Goal: RH STG INCREASE KNOWLEDGE OF DYSPHAGIA/FLUID INTAKE Description: Patient/Family will be able to describe and demonstrate safe swallowing techniques and identify foods in D2 diet scope with cues/handouts Outcome: Progressing Goal: RH STG INCREASE KNOWLEGDE OF HYPERLIPIDEMIA Description: Patient/family will be able to describe management of hyperlipidemia including medication, diet, and lifestyle interventions with cues/handouts Outcome: Progressing Goal: RH STG INCREASE KNOWLEDGE OF STROKE PROPHYLAXIS Description: Patient/family will be able to describe stroke prophylaxis including medication, diet, and lifestyle interventions with cues/handouts Outcome: Progressing

## 2019-07-27 ENCOUNTER — Inpatient Hospital Stay (HOSPITAL_COMMUNITY): Payer: BC Managed Care – PPO | Admitting: Occupational Therapy

## 2019-07-27 ENCOUNTER — Inpatient Hospital Stay (HOSPITAL_COMMUNITY): Payer: BC Managed Care – PPO

## 2019-07-27 DIAGNOSIS — D708 Other neutropenia: Secondary | ICD-10-CM

## 2019-07-27 DIAGNOSIS — Z720 Tobacco use: Secondary | ICD-10-CM

## 2019-07-27 DIAGNOSIS — A419 Sepsis, unspecified organism: Secondary | ICD-10-CM

## 2019-07-27 DIAGNOSIS — F1010 Alcohol abuse, uncomplicated: Secondary | ICD-10-CM

## 2019-07-27 DIAGNOSIS — R030 Elevated blood-pressure reading, without diagnosis of hypertension: Secondary | ICD-10-CM

## 2019-07-27 LAB — BASIC METABOLIC PANEL
Anion gap: 8 (ref 5–15)
BUN: <5 mg/dL — ABNORMAL LOW (ref 8–23)
CO2: 27 mmol/L (ref 22–32)
Calcium: 9.2 mg/dL (ref 8.9–10.3)
Chloride: 107 mmol/L (ref 98–111)
Creatinine, Ser: 0.84 mg/dL (ref 0.61–1.24)
GFR calc Af Amer: >60 mL/min (ref >60)
GFR calc non Af Amer: >60 mL/min (ref >60)
Glucose, Bld: 101 mg/dL — ABNORMAL HIGH (ref 70–99)
Potassium: 4 mmol/L (ref 3.5–5.1)
Sodium: 142 mmol/L (ref 135–145)

## 2019-07-27 LAB — CBC WITH DIFFERENTIAL/PLATELET
Abs Immature Granulocytes: 0.01 10*3/uL (ref 0.00–0.07)
Basophils Absolute: 0 10*3/uL (ref 0.0–0.1)
Basophils Relative: 1 %
Eosinophils Absolute: 0.2 10*3/uL (ref 0.0–0.5)
Eosinophils Relative: 4 %
HCT: 42 % (ref 39.0–52.0)
Hemoglobin: 14.3 g/dL (ref 13.0–17.0)
Immature Granulocytes: 0 %
Lymphocytes Relative: 28 %
Lymphs Abs: 1 10*3/uL (ref 0.7–4.0)
MCH: 31.4 pg (ref 26.0–34.0)
MCHC: 34 g/dL (ref 30.0–36.0)
MCV: 92.3 fL (ref 80.0–100.0)
Monocytes Absolute: 0.4 10*3/uL (ref 0.1–1.0)
Monocytes Relative: 10 %
Neutro Abs: 2.1 10*3/uL (ref 1.7–7.7)
Neutrophils Relative %: 57 %
Platelets: 271 10*3/uL (ref 150–400)
RBC: 4.55 MIL/uL (ref 4.22–5.81)
RDW: 15.5 % (ref 11.5–15.5)
WBC: 3.7 10*3/uL — ABNORMAL LOW (ref 4.0–10.5)
nRBC: 0 % (ref 0.0–0.2)

## 2019-07-27 LAB — CULTURE, BLOOD (ROUTINE X 2)
Culture: NO GROWTH
Culture: NO GROWTH
Special Requests: ADEQUATE
Special Requests: ADEQUATE

## 2019-07-27 LAB — ANTINUCLEAR ANTIBODIES, IFA: ANA Ab, IFA: NEGATIVE

## 2019-07-27 LAB — RHEUMATOID FACTOR: Rheumatoid fact SerPl-aCnc: 28.6 IU/mL — ABNORMAL HIGH (ref 0.0–13.9)

## 2019-07-27 NOTE — Progress Notes (Signed)
Occupational Therapy Session Note  Patient Details  Name: Russell Russell MRN: [id number] Date of Birth: [birth year]  Today's Date: 07/27/2019 OT Individual Time: [id number] OT Individual Time Calculation (min): 75 min    Short Term Goals: Week 3:  OT Short Term Goal 1 (Week 3): Pt will don pants with mod assist sit > stand OT Short Term Goal 2 (Week 3): Pt will complete sit > stand with min assist during self-care tasks OT Short Term Goal 3 (Week 3): Pt will complete UB dressing with min assist OT Short Term Goal 4 (Week 3): Pt will complete toilet transfers with mod assist to Rt and Lt  Skilled Therapeutic Interventions/Progress Updates:   Treatment session with focus on self-care retraining with Lt attention, postural control, sit > stand, and functional transfers.  Pt asleep upon arrival requiring increased time to fully arouse.  Pt still connected to IV therefore engaged in bathing at sink side.  Completed bed mobility with min assist and cues for sequencing to increase independence. Completed squat pivot transfer to Rt with mod assist, pt continuing to demonstrate increased weight shift during transfer.  Engaged in bathing and dressing at sink with focus on midline orientation in sitting and standing as well as attention to Lt to locate items in Lt visual field. Pt required hand over hand assist to incorporate use of LUE during washing Rt arm.  Pt required min assist sit > stand throughout bathing at sink, up to mod assist for standing balance when pt washing buttocks and perineal area.  Therapist providing cues and setup to locate items on Lt during self-care tasks. Issued elastic shoe laces to increase success when donning shoes as pt unable to fasten at this time.  Educated on use of LUE as gross assist during oral care tasks.  Engaged in sit > stand at high low table with focus on midline orientation in standing.  Incorporated use of LUE with reaching to Lt to locate and obtain magnets to  place at midline in standing.  thearpist providing Mod assist for dynamic standing balance and assist at LUE to facilitate increased reach.  Pt fatigues quickly in standing requiring increased rest breaks.  Returned to room and left reclined in TIS w/c with seat belt alarm on and all needs in reach.  Therapy Documentation Precautions:  Precautions Precautions: Fall Precaution Comments: permissive HTN initially (<220/120) then normotension, L sided weakness, aspiration precautions (dysphagia 2 with thins), ; L hemiparesis Restrictions Weight Bearing Restrictions: No Pain:  Pt with no c/o pain   Therapy/Group: Individual Therapy  [name] 07/27/2019, 8:49 AM

## 2019-07-27 NOTE — Progress Notes (Signed)
[facility] for Infectious Disease  Date of Admission:  07/07/2019      Total days of antibiotics 0           ASSESSMENT: Russell Russell is a 69 y.o. male admitted to the inpatient rehab program s/p CVA that was experiencing fevers last week. He has been afebrile off antibiotics now for 4 days and no concerning diagnostic findings or symptoms that suggest any bacterial process at this time.  I explained that we will sign off for now but happy to see him back in the event something changes in his condition.     PLAN: 1. No indication for antibiotics   Thank you for the opportunity to meet Russell Russell.    Principal Problem:   Cerebrovascular accident (CVA) of right basal ganglia (HCC) Active Problems:   Essential hypertension   Tobacco abuse   Alcohol abuse   Leukopenia   Benign essential HTN   Hypoalbuminemia   Labile blood pressure   Blood pressure increase diastolic   Fever   Sepsis without acute organ dysfunction (HCC)   Elevated BUN   AKI (acute kidney injury) ([place])   . aspirin  300 mg Rectal Daily   Or  . aspirin  325 mg Oral Daily  . atorvastatin  40 mg Oral Daily  . clopidogrel  75 mg Oral Daily  . enoxaparin (LOVENOX) injection  40 mg Subcutaneous Q24H  . feeding supplement (ENSURE ENLIVE)  237 mL Oral BID BM  . folic acid  1 mg Oral Daily  . lisinopril  2.5 mg Oral Daily  . metoprolol tartrate  25 mg Oral BID  . multivitamin with minerals  1 tablet Oral Daily  . nicotine  21 mg Transdermal Daily  . pantoprazole  40 mg Oral Daily  . polycarbophil  1,250 mg Oral BID  . thiamine  100 mg Oral Daily    SUBJECTIVE: Doing well today without concern. Watching TV. No fevers/chills or night sweats since last week.    Review of Systems: Review of Systems  Constitutional: Negative for chills, diaphoresis and fever.  Respiratory: Negative for cough.   Genitourinary: Negative for dysuria.  Musculoskeletal: Negative for joint pain and  myalgias.  Skin: Negative for rash.  Neurological: Positive for weakness (s/p CVA).    Allergies  Allergen Reactions  . Penicillins Rash    OBJECTIVE: Vitals:   07/26/19 1632 07/26/19 1939 07/27/19 0423 07/27/19 1515  BP: (!) 141/103 (!) 148/107 (!) 153/107 (!) 134/98  Pulse: 77 74 68 85  Resp: 18   16  Temp:  (!) 97.4 F (36.3 C) (!) 97.5 F (36.4 C) 98 F (36.7 C)  TempSrc:      SpO2: 99% 99% 98%   Weight:      Height:       Body mass index is 18.44 kg/m.  Physical Exam Vitals and nursing note reviewed.  Constitutional:      Comments: Seated in wheelchair watching TV in no distress. Pleasant.   HENT:     Mouth/Throat:     Mouth: Mucous membranes are moist.     Pharynx: Oropharynx is clear.  Eyes:     Pupils: Pupils are equal, round, and reactive to light.  Cardiovascular:     Rate and Rhythm: Normal rate and regular rhythm.     Heart sounds: No murmur.  Pulmonary:     Effort: Pulmonary effort is normal.     Breath sounds: Normal breath  sounds.  Abdominal:     General: There is no distension.     Palpations: Abdomen is soft.     Tenderness: There is no abdominal tenderness.  Skin:    General: Skin is warm and dry.     Capillary Refill: Capillary refill takes less than 2 seconds.  Neurological:     Mental Status: He is alert and oriented to person, place, and time.     Lab Results Lab Results  Component Value Date   WBC 3.7 (L) 07/27/2019   HGB 14.3 07/27/2019   HCT 42.0 07/27/2019   MCV 92.3 07/27/2019   PLT 271 07/27/2019    Lab Results  Component Value Date   CREATININE 0.84 07/27/2019   BUN <5 (L) 07/27/2019   NA 142 07/27/2019   K 4.0 07/27/2019   CL 107 07/27/2019   CO2 27 07/27/2019    Lab Results  Component Value Date   ALT 19 07/08/2019   AST 32 07/08/2019   ALKPHOS 84 07/08/2019   BILITOT 1.3 (H) 07/08/2019     Microbiology: Recent Results (from the past 240 hour(s))  Culture, blood (routine x 2)     Status: None    Collection Time: 07/20/19  9:10 PM   Specimen: BLOOD RIGHT ARM  Result Value Ref Range Status   Specimen Description BLOOD RIGHT ARM  Final   Special Requests   Final    BOTTLES DRAWN AEROBIC AND ANAEROBIC Blood Culture adequate volume   Culture   Final    NO GROWTH 5 DAYS Performed at [facility], [address] [place], [place] [address]    Report Status 07/25/2019 FINAL  Final  Culture, Urine     Status: Abnormal   Collection Time: 07/20/19  9:14 PM   Specimen: Urine, Catheterized  Result Value Ref Range Status   Specimen Description URINE, CATHETERIZED  Final   Special Requests   Final    Normal Performed at [facility], [place] [address], [place], [place] [address]    Culture MULTIPLE SPECIES PRESENT, SUGGEST RECOLLECTION (A)  Final   Report Status 07/21/2019 FINAL  Final  Culture, blood (routine x 2)     Status: None   Collection Time: 07/20/19  9:20 PM   Specimen: BLOOD LEFT ARM  Result Value Ref Range Status   Specimen Description BLOOD LEFT ARM  Final   Special Requests   Final    BOTTLES DRAWN AEROBIC AND ANAEROBIC Blood Culture adequate volume   Culture   Final    NO GROWTH 5 DAYS Performed at [facility], [place] [address] [place], [place] [address]    Report Status 07/25/2019 FINAL  Final  Culture, blood (routine x 2)     Status: None   Collection Time: 07/22/19  5:38 PM   Specimen: BLOOD RIGHT ARM  Result Value Ref Range Status   Specimen Description BLOOD RIGHT ARM  Final   Special Requests AEROBIC BOTTLE ONLY Blood Culture adequate volume  Final   Culture   Final    NO GROWTH 5 DAYS Performed at [facility], [place] [address], [place], [place] [address]    Report Status 07/27/2019 FINAL  Final  Culture, blood (routine x 2)     Status: None   Collection Time: 07/22/19  5:43 PM   Specimen: BLOOD LEFT HAND  Result Value Ref Range Status   Specimen Description BLOOD LEFT HAND  Final   Special Requests AEROBIC BOTTLE ONLY  Blood Culture  adequate volume  Final   Culture   Final    NO GROWTH 5 DAYS Performed at [facility], [place] [address], [place], [place] [address]    Report Status 07/27/2019 FINAL  Final  Culture, Urine     Status: Abnormal   Collection Time: 07/22/19  9:06 PM   Specimen: Urine, Random  Result Value Ref Range Status   Specimen Description URINE, RANDOM  Final   Special Requests NONE  Final   Culture (A)  Final    <10,000 COLONIES/mL INSIGNIFICANT GROWTH Performed at [facility], [place] [address] [place], [place] [address]    Report Status 07/24/2019 FINAL  Final     Janene Madeira, MSN, NP-C [facility] [email or web address] Pager: [phone] Office: [phone] [place]: [phone]

## 2019-07-27 NOTE — Progress Notes (Signed)
Physical Therapy Session Note  Patient Details  Name: Russell Russell MRN: [id number] Date of Birth: 1950/12/23  Today's Date: 07/27/2019 PT Individual Time: 1300-1415 PT Individual Time Calculation (min): 75 min   Short Term Goals: Week 2:  PT Short Term Goal 1 (Week 2): pt will transfer bed<>chair min A PT Short Term Goal 1 - Progress (Week 2): Met PT Short Term Goal 2 (Week 2): pt will transfer sit<>stand CGA PT Short Term Goal 2 - Progress (Week 2): Progressing toward goal PT Short Term Goal 3 (Week 2): Pt will ambulate 43f with LRAD max A of 1 PT Short Term Goal 3 - Progress (Week 2): Progressing toward goal Week 3:  PT Short Term Goal 1 (Week 3): pt will transfer sit<>stand CGA PT Short Term Goal 2 (Week 3): Pt will ambulate 234fwith LRAD max A of 1 PT Short Term Goal 3 (Week 3): Pt will perform WC mobility [id number] Therapeutic Interventions/Progress Updates:   Received pt supine in bed, pt agreeable to therapy, and denied any pain during session. Session focused on functional mobility/transfers, LE strength, dynamic sitting/standing balance/coordination, gait training, NMR, motor control/planning, midline orientation, and improved activity tolerance. Pt rolled to L and R with min A and use of bedrails to see if brief was soiled prior to therapy; pt dry. Pt transferred supine<>sitting EOB with CGA, increased time, and verbal cues for logroll technique. Pt doffed dirty gown and donned pull over shirt with min A. Donned shoes sitting EOB with total assist for time management purposes. Pt transferred bed<>TIS WC stand<>pivot to L with mod A. Pt [facility] for time management purposes. Pt transferred sit<>stand min A x 2 trials with rail on R using mirror for visual feedback. Pt able to achieve midline orientation and able to extend L knee without assist. Worked on pre-gait stepping with R LE 2x10 reps with min A and very minimal assist to block L knee  buckling. Proceeded to work on gait training due to improvements in standing balance. Donned LiteGait harness in sitting with max A +1 and pt transferred sit<>stand min A and required +2 assist to fasten harness in standing. Pt with moderate pushing to L and L knee buckling requiring mod A to correct. Pt ambulated [id number] and 36ft63f trial with LiteGait over ground with +2 assist. Pt required manual facilitation to block L knee buckling, advance L LE, maintain hips forward, and to weight shift bilaterally for improved foot clearance. Pt required multiple rest breaks throughout session due to increased fatigue. Pt transferred squat<>pivot TIS WC<>Nustep min A to R. Pt performed bilateral UE/LE strengthening on Nustep at workload 2 for 10 minutes for a total of 374 steps with emphasis on attending to and activating L side. Pt required mod cues throughout to reposition L UE on handgrip as pt unaware when L UE began to slide off. Pt transferred Nustep<>TIS WC squat<>pivot to L mod A. Pt [facility]. Concluded session with pt semi-reclined in TIS WC, needs within reach, and seatbelt alarm on. Therapist provided pt with ensure to drink.   Therapy Documentation Precautions:  Precautions Precautions: Fall Precaution Comments: permissive HTN initially (<220/120) then normotension, L sided weakness, aspiration precautions (dysphagia 2 with thins), ; L hemiparesis Restrictions Weight Bearing Restrictions: No  Therapy/Group: Individual Therapy Seymone Forlenza Alfonse AlpersDPT   07/27/2019, 7:26 AM

## 2019-07-27 NOTE — Progress Notes (Addendum)
[place] PHYSICAL MEDICINE & REHABILITATION PROGRESS NOTE  Subjective/Complaints: Patient seen sitting up in his chair this morning working with therapies.  He states he slept well overnight.  He states he had a good weekend.  Discussed deseeding IV and showering with therapies.  ROS: Denies fevers, chills, CP, SOB, N/V/D  Objective: Vital Signs: Blood pressure (!) 153/107, pulse 68, temperature (!) 97.5 F (36.4 C), resp. rate 18, height 5\' 10"  (1.778 m), weight 58.3 kg, SpO2 98 %. No results found. Recent Labs    07/27/19 0542  WBC 3.7*  HGB 14.3  HCT 42.0  PLT 271   Recent Labs    07/27/19 0542  NA 142  K 4.0  CL 107  CO2 27  GLUCOSE 101*  BUN <5*  CREATININE 0.84  CALCIUM 9.2    Physical Exam: BP (!) 153/107 (BP Location: Right Arm)   Pulse 68   Temp (!) 97.5 F (36.4 C)   Resp 18   Ht 5\' 10"  (1.778 m)   Wt 58.3 kg   SpO2 98%   BMI 18.44 kg/m  Constitutional: No distress . Vital signs reviewed. HENT: Normocephalic.  Atraumatic. Eyes: EOMI. No discharge. Cardiovascular: No JVD.  RRR. Respiratory: Normal effort.  No stridor.  Bilaterally clear to auscultation. GI: Non-distended. Skin: Warm and dry.  Intact. Psych: Normal mood.  Normal behavior. Musc: No edema in extremities.  No tenderness in extremities. Neuro: Alert Motor: Left upper extremity: Shoulder abduction 4/5, distally 4/5, with apraxia and ataxia, unchanged Left lower extremity: Hip flexion 3-/5, knee extension 3/5, ankle dorsiflexion 4/5 Dysarthria, unchanged Left facial weakness, stable.   Assessment/Plan: 1. Functional deficits secondary to multiple anterior and posterior right brain infarcts which require 3+ hours per day of interdisciplinary therapy in a comprehensive inpatient rehab setting.  Physiatrist is providing close team supervision and 24 hour management of active medical problems listed below.  Physiatrist and rehab team continue to assess barriers to discharge/monitor  patient progress toward functional and medical goals  Care Tool:  Bathing    Body parts bathed by patient: Left arm, Chest, Abdomen, Front perineal area, Buttocks, Right upper leg, Left upper leg, Face   Body parts bathed by helper: Right arm     Bathing assist Assist Level: Moderate Assistance - Patient 50 - 74%     Upper Body Dressing/Undressing Upper body dressing   What is the patient wearing?: Hospital gown only    Upper body assist Assist Level: Minimal Assistance - Patient > 75%    Lower Body Dressing/Undressing Lower body dressing      What is the patient wearing?: Pants, Incontinence brief     Lower body assist Assist for lower body dressing: Moderate Assistance - Patient 50 - 74%     Toileting Toileting Toileting Activity did not occur (Clothing management and hygiene only): N/A (no void or bm)  Toileting assist Assist for toileting: 2 Helpers     Transfers Chair/bed transfer  Transfers assist     Chair/bed transfer assist level: Moderate Assistance - Patient 50 - 74%     Locomotion Ambulation   Ambulation assist   Ambulation activity did not occur: Safety/medical concerns(L hemi, decreased postural control/balance, fatigue)  Assist level: 2 helpers Assistive device: Other (comment)(wall on R side without rail) Max distance: 5 ft   Walk 10 feet activity   Assist  Walk 10 feet activity did not occur: Safety/medical concerns(L hemi, decreased postural control/balance, fatigue)  Assist level: 2 helpers Assistive device: Lite Gait  Walk 50 feet activity   Assist Walk 50 feet with 2 turns activity did not occur: Safety/medical concerns(L hemi, decreased postural control/balance, fatigue)         Walk 150 feet activity   Assist Walk 150 feet activity did not occur: Safety/medical concerns(L hemi, decreased postural control/balance, fatigue)         Walk 10 feet on uneven surface  activity   Assist Walk 10 feet on uneven  surfaces activity did not occur: Safety/medical concerns(L hemi, decreased postural control/balance, fatigue)         Wheelchair     Assist Will patient use wheelchair at discharge?: Yes Type of Wheelchair: Manual    Wheelchair assist level: Dependent - Patient 0%      Wheelchair 50 feet with 2 turns activity    Assist        Assist Level: Dependent - Patient 0%   Wheelchair 150 feet activity     Assist     Assist Level: Dependent - Patient 0%      Medical Problem List and Plan: 1.  Left side weakness secondary to multiple scattered anterior and posterior right brain infarcts.  Status post loop recorder  Continue CIR  Repeat head CT personally reviewed, unchanged 2.  Antithrombotics: -DVT/anticoagulation: Lovenox             -antiplatelet therapy: Aspirin 325 mg daily and Plavix 75 mg daily x3 months then aspirin alone 3. Pain Management: Tylenol as needed. Well controlled 4. Mood: Provide emotional support             -antipsychotic agents: N/A 5. Neuropsych: This patient is ?fully capable of making decisions on his own behalf. 6. Skin/Wound Care: Routine skin checks 7. Fluids/Electrolytes/Nutrition: Routine in and outs. 8.  Hypertension.  Lopressor 12.5 mg twice daily, increased to 25 on 5/8.    Started Lisinopril 2.5mg  daily  Elevated on 5/17, likely secondary to IVF, DC'd  Monitor with increased mobility 9.  Hyperlipidemia. Lipitor 10.  Dysphagia.  Dysphagia #3 thins liquids.  Follow-up speech therapy  Cont to advance diet as tolerated 11.  History of tobacco and alcohol use.  NicoDerm patch.  Provide counseling.    Monitor for withdrawal 12.  Moderate hypoalbuminemia  Supplement initiated on 4/28 13.  WBCs  WBCs 3.7 on 5/17  Cont to monitor 13.  AKI-resolved with IVF  Echocardiogram reviewed, EF ~60%  IVF DC'd on 5/17  Creatinine 0.84 on 5/17 14.  Sepsis  See #12  Afebrile at present  UA suspicious, urine culture multiple species;  repeat UA suspicious again, urine culture insignificant growth  Blood cultures NG; repeat blood cultures NGTD  Chest x-ray personally reviewed again, unremarkable for infection  Influenza negative  Covid negative  Empiric Septra d/ced, IV Vanco and cefepime DC'd  Procalcitonin within normal limits on 5/14  ID consulted-recommended CT abdomen/pelvis and chest-unremarkable for infection, + atelectasis 15.  Tobacco abuse: Continue nicotine patch.  16.  Alcohol abuse: Continue to Counsel   LOS: 20 days A FACE TO FACE EVALUATION WAS PERFORMED  Tiffanny Lamarche Lorie Phenix 07/27/2019, 8:29 AM

## 2019-07-27 NOTE — Progress Notes (Signed)
Speech Language Pathology Daily Session Note  Patient Details  Name: Russell Russell MRN: [id number] Date of Birth: 06-19-1950  Today's Date: 07/27/2019 SLP Individual Time: [id number] SLP Individual Time Calculation (min): 43 min  Short Term Goals: Week 3: SLP Short Term Goal 1 (Week 3): Patient will utilize speech intelligibility strategies with supervision verbal cues to achieve ~90% intelligibility at the sentence level. SLP Short Term Goal 2 (Week 3): Pt will demonstrate efficient mastication and oral clearance of upgraded dysphagia 3 texture diet Mod I for use of swallow strategies for oral clarance and to minimize anterior loss SLP Short Term Goal 3 (Week 3): Patient will name 2 physical and 2 cognitive changes with Mod verbal cues. SLP Short Term Goal 4 (Week 3): Patient will demonstrate functional problem solving for basic and familiar tasks with Min verbal cues. SLP Short Term Goal 5 (Week 3): Patient will utilize external aids for recall of functional information with Min verbal and visual cues.  Skilled Therapeutic Interventions: Skilled ST services focused on swallow and cognitive skills. Pt demonstrated reduced vocal intensity compared to previous session with writer, however intelligibly remained 90% intelligible. Pt completed x25 of EMST set at 15 cm H20 with continued self-preceived effort rating of 7 out 10. SLP facilitated basic problem solving skills in functional money management ( ALFA), pt demonstrated ability to count money and display requested amounts with supervision A verbal cues for errors and make basic change given support of written aid with supervision A verbal cues. Pt required max A verbal cues for mental math, likely due to working memory deficits.  SLP also facilitated basic to semi-complex medication management utilizing ALFA, pt required max A fade (1/2 way through) to mod A verbal cues. Pt consumed regular textured snack and thin liquid via straw with appropriate  oral clearance and no overt s/s aspiration. SLP upgradedd iet to regular textures and medication whole with thin liquids, with continued intermittent supervision to assess tolerance of upgraded diet.  Pt was left in room with call bell within reach and chair alarm set. ST recommends to continue skilled ST services.      Pain Pain Assessment Pain Score: 0-No pain  Therapy/Group: Individual Therapy  [name]  [facility] 07/27/2019, 12:45 PM

## 2019-07-28 ENCOUNTER — Inpatient Hospital Stay (HOSPITAL_COMMUNITY): Payer: BC Managed Care – PPO

## 2019-07-28 ENCOUNTER — Inpatient Hospital Stay (HOSPITAL_COMMUNITY): Payer: BC Managed Care – PPO | Admitting: Occupational Therapy

## 2019-07-28 LAB — PROTEIN ELECTROPHORESIS, SERUM
A/G Ratio: 0.7 (ref 0.7–1.7)
Albumin ELP: 2.3 g/dL — ABNORMAL LOW (ref 2.9–4.4)
Alpha-1-Globulin: 0.3 g/dL (ref 0.0–0.4)
Alpha-2-Globulin: 1 g/dL (ref 0.4–1.0)
Beta Globulin: 0.9 g/dL (ref 0.7–1.3)
Gamma Globulin: 1.2 g/dL (ref 0.4–1.8)
Globulin, Total: 3.5 g/dL (ref 2.2–3.9)
M-Spike, %: 0.3 g/dL — ABNORMAL HIGH
Total Protein ELP: 5.8 g/dL — ABNORMAL LOW (ref 6.0–8.5)

## 2019-07-28 NOTE — Progress Notes (Signed)
Occupational Therapy Session Note  Patient Details  Name: Russell Russell MRN: [id number] Date of Birth: 12-Mar-1951  Today's Date: 07/28/2019 OT Individual Time: [id number] and [id number] OT Individual Time Calculation (min): 42 min and 40 min and Today's Date: 07/28/2019 OT Missed Time: 33 Minutes Missed Time Reason: Patient fatigue   Short Term Goals: Week 3:  OT Short Term Goal 1 (Week 3): Pt will don pants with mod assist sit > stand OT Short Term Goal 2 (Week 3): Pt will complete sit > stand with min assist during self-care tasks OT Short Term Goal 3 (Week 3): Pt will complete UB dressing with min assist OT Short Term Goal 4 (Week 3): Pt will complete toilet transfers with mod assist to Rt and Lt  Skilled Therapeutic Interventions/Progress Updates:    1) Treatment session with focus on trunk control during self-care tasks of bathing/dressing and during dynamic standing balance.  Pt initially refusing therapy session due to fatigue.  Returned 15 mins later with pt still stating "not now".  Notified scheduling to attempt later in AM for future sessions.  Upon therapist ultimate return pt still hesitant but ultimately agreeable.  Engaged in bed mobility with CGA and min cues for sequencing.  Engaged in [place] and hygiene at sit > stand level from EOB with focus on trunk control and midline standing balance.  Pt required min-mod assist for standing balance without mirror for visual feedback, with ability to pull pants over hips with min-mod assist for standing balance.  Educated on use of shoe funnel when donning shoes, therapist setup first shoe and provided education then pt able to setup and don other shoe with only encouragement and CGA for sitting balance.  Squat pivot transfer mod assist to Rt with improved weight shifting.  Engaged in sit > stand in therapy gym with use of mirror for visual feedback.  Utilized LUE in standing with focus on grasp and release as well as horizontal movements  to increase functional use.  Pt requiring increased physical assist for standing balance as he fatigued, but never more than mod assist this session.  Returned to room and transferred to recliner mod assist.  Pt left upright in recliner with seat belt alarm on and all needs in reach.  2) Treatment session with focus on dynamic standing balance, sit <> stand, and weight shifting during dynamic standing.  Pt received in recliner attempting to get out of seat belt alarm.  When questioned, pt reports trying to get to bed.  Educated on fall risk and need to call for assistance as well as safety belt for pt's safety due to increased fall risk.  Engaged in discussion regarding standard w/c for home, as pt initially stating that he didn't like change in w/c.  Pt ultimately reporting understanding and wanting to have increased independence and less burden on wife.  Pt agreeable to therapy session with plan to return to bed at end of session.  Education on w/c brakes management with added challenge to use LUE to unlock brakes.  Engaged in visual scanning and following 2 step commands while utilizing [employer] in sitting progressing to standing.  Pt demonstrating equivalent reaction time on Lt compared to Rt during all tasks.  Final attempt completed in standing with all lights in Rt upper quadrant to facilitate weight shifting to Rt.  Pt required min assist sit > stand and up to mod assist as he fatigued with reaching task for 60 seconds.  Upon return to room, transferred back  to bed min assist to Lt and transitioned to supine with CGA and cues to attend to LLE.  Engaged in discussion regarding progress towards goals.  Pt remained semi-reclined in bed with all needs in reach.  Therapy Documentation Precautions:  Precautions Precautions: Fall Precaution Comments: permissive HTN initially (<220/120) then normotension, L sided weakness, aspiration precautions (dysphagia 2 with thins), ; L  hemiparesis Restrictions Weight Bearing Restrictions: No General: General OT Amount of Missed Time: 33 Minutes Pain:  Pt with no c/o pain in AM or PM session.   Therapy/Group: Individual Therapy  [name] 07/28/2019, 2:28 PM

## 2019-07-28 NOTE — Progress Notes (Addendum)
Speech Language Pathology Weekly Progress and Session Note  Patient Details  Name: Fawzi Melman MRN: [id number] Date of Birth: 1950/10/06  Beginning of progress report period: Jul 22, 2019 End of progress report period: Jul 28, 2019  Today's Date: 07/28/2019 SLP Individual Time: [id number] SLP Individual Time Calculation (min): 25 min  Short Term Goals: Week 3: SLP Short Term Goal 1 (Week 3): Patient will utilize speech intelligibility strategies with supervision verbal cues to achieve ~90% intelligibility at the sentence level. SLP Short Term Goal 1 - Progress (Week 3): Met SLP Short Term Goal 2 (Week 3): Pt will demonstrate efficient mastication and oral clearance of upgraded dysphagia 3 texture diet Mod I for use of swallow strategies for oral clarance and to minimize anterior loss SLP Short Term Goal 2 - Progress (Week 3): Met SLP Short Term Goal 3 (Week 3): Patient will name 2 physical and 2 cognitive changes with Mod verbal cues. SLP Short Term Goal 3 - Progress (Week 3): Met SLP Short Term Goal 4 (Week 3): Patient will demonstrate functional problem solving for basic and familiar tasks with Min verbal cues. SLP Short Term Goal 4 - Progress (Week 3): Met SLP Short Term Goal 5 (Week 3): Patient will utilize external aids for recall of functional information with Min verbal and visual cues. SLP Short Term Goal 5 - Progress (Week 3): Met    New Short Term Goals: Week 4: SLP Short Term Goal 1 (Week 4): STG=LTG due to short ELOS  Weekly Progress Updates: Pt made strong progress meeting 5 out 5 goals this reporting period. Pt initially demonstrated poor cognitive skills in the beginning of the reporting period, however following treatment for UTI mentation has greatly improved. Pt was upgraded to regular texture diet with reduced supervision at intermittent level to ensure continued use of swallow strategies for oral clearance due to fluctuating mentation. Pt demonstrated ability to  complete basic problem solving task in money management and novel cards tasks with min A verbal cues and mildly complex medication task with mod A verbal cues. Pt demonstrated improved recall of daily events, increase sustained attention and awareness. Pt would continue to benefit from skills ST services in order to maximumize functional indepdence and reduce burden of care, requring 24 hour supervision and continued ST services.     Intensity: Minumum of 1-2 x/day, 30 to 90 minutes Frequency: 3 to 5 out of 7 days Duration/Length of Stay: 5/26 Treatment/Interventions: Cognitive remediation/compensation;Dysphagia/aspiration precaution training;Internal/external aids;Speech/Language facilitation;Therapeutic Activities;Cueing hierarchy;Environmental controls;Functional tasks;Patient/family education   Daily Session  Skilled Therapeutic Interventions:  Skilled ST services focused on cognitive skills. SLP facilitated mildly complex problem solving and recall with aid skills in x3 medication a day pill organizer task, pt required min A verbal cues for functional problem solving, mod A verbal cues for verbal problem solving (x2 per day) and min A verbal cues for recall within task. SLP recommends to finish task in upcoming treatment session. Pt was left in room with call bell within reach and bed alarm set. ST recommends to continue skilled ST services.      General    Pain Pain Assessment Pain Score: 0-No pain  Therapy/Group: Individual Therapy  Brandee Markin  [facility] 07/28/2019, 4:49 PM

## 2019-07-28 NOTE — Progress Notes (Signed)
[place] PHYSICAL MEDICINE & REHABILITATION PROGRESS NOTE  Subjective/Complaints: Patient seen laying in bed this morning.  He states he slept well overnight.  He denies complaints. He was seen by ID yesterday, noted reviewed - signed off.   ROS: Denies fevers, chills, CP, SOB, N/V/D  Objective: Vital Signs: Blood pressure (!) 129/98, pulse 83, temperature 97.9 F (36.6 C), temperature source Oral, resp. rate 18, height 5\' 10"  (1.778 m), weight 58.3 kg, SpO2 100 %. No results found. Recent Labs    07/27/19 0542  WBC 3.7*  HGB 14.3  HCT 42.0  PLT 271   Recent Labs    07/27/19 0542  NA 142  K 4.0  CL 107  CO2 27  GLUCOSE 101*  BUN <5*  CREATININE 0.84  CALCIUM 9.2    Physical Exam: BP (!) 129/98 (BP Location: Left Arm)   Pulse 83   Temp 97.9 F (36.6 C) (Oral)   Resp 18   Ht 5\' 10"  (1.778 m)   Wt 58.3 kg   SpO2 100%   BMI 18.44 kg/m  Constitutional: No distress . Vital signs reviewed. HENT: Normocephalic.  Atraumatic. Eyes: EOMI. No discharge. Cardiovascular: No JVD. Respiratory: Normal effort.  No stridor. GI: Non-distended. Skin: Warm and dry.  Intact. Psych: Normal mood.  Normal behavior. Musc: No edema in extremities.  No tenderness in extremities. Neuro: Alert Motor: Left upper extremity: Shoulder abduction 4/5, distally 4/5, with apraxia and ataxia, unchanged Left lower extremity: Hip flexion 3+/5, knee extension 3 +/5, ankle dorsiflexion 4/5 Dysarthria, stable Left facial weakness, stable.   Assessment/Plan: 1. Functional deficits secondary to multiple anterior and posterior right brain infarcts which require 3+ hours per day of interdisciplinary therapy in a comprehensive inpatient rehab setting.  Physiatrist is providing close team supervision and 24 hour management of active medical problems listed below.  Physiatrist and rehab team continue to assess barriers to discharge/monitor patient progress toward functional and medical goals  Care  Tool:  Bathing    Body parts bathed by patient: Left arm, Chest, Abdomen, Front perineal area, Buttocks, Right upper leg, Left upper leg, Face   Body parts bathed by helper: Right arm     Bathing assist Assist Level: Moderate Assistance - Patient 50 - 74%     Upper Body Dressing/Undressing Upper body dressing   What is the patient wearing?: Hospital gown only    Upper body assist Assist Level: Minimal Assistance - Patient > 75%    Lower Body Dressing/Undressing Lower body dressing      What is the patient wearing?: Pants, Incontinence brief     Lower body assist Assist for lower body dressing: Moderate Assistance - Patient 50 - 74%     Toileting Toileting Toileting Activity did not occur (Clothing management and hygiene only): N/A (no void or bm)  Toileting assist Assist for toileting: 2 Helpers     Transfers Chair/bed transfer  Transfers assist     Chair/bed transfer assist level: Moderate Assistance - Patient 50 - 74%     Locomotion Ambulation   Ambulation assist   Ambulation activity did not occur: Safety/medical concerns(L hemi, decreased postural control/balance, fatigue)  Assist level: 2 helpers Assistive device: Lite Gait Max distance: 29ft   Walk 10 feet activity   Assist  Walk 10 feet activity did not occur: Safety/medical concerns(L hemi, decreased postural control/balance, fatigue)  Assist level: 2 helpers Assistive device: Lite Gait   Walk 50 feet activity   Assist Walk 50 feet with 2 turns activity  did not occur: Safety/medical concerns(L hemi, decreased postural control/balance, fatigue)         Walk 150 feet activity   Assist Walk 150 feet activity did not occur: Safety/medical concerns(L hemi, decreased postural control/balance, fatigue)         Walk 10 feet on uneven surface  activity   Assist Walk 10 feet on uneven surfaces activity did not occur: Safety/medical concerns(L hemi, decreased postural control/balance,  fatigue)         Wheelchair     Assist Will patient use wheelchair at discharge?: Yes Type of Wheelchair: Manual    Wheelchair assist level: Dependent - Patient 0%      Wheelchair 50 feet with 2 turns activity    Assist        Assist Level: Dependent - Patient 0%   Wheelchair 150 feet activity     Assist     Assist Level: Dependent - Patient 0%      Medical Problem List and Plan: 1.  Left side weakness secondary to multiple scattered anterior and posterior right brain infarcts.  Status post loop recorder  Continue CIR  Repeat head CT personally reviewed, unchanged 2.  Antithrombotics: -DVT/anticoagulation: Lovenox             -antiplatelet therapy: Aspirin 325 mg daily and Plavix 75 mg daily x3 months then aspirin alone 3. Pain Management: Tylenol as needed. Well controlled 4. Mood: Provide emotional support             -antipsychotic agents: N/A 5. Neuropsych: This patient is ?fully capable of making decisions on his own behalf. 6. Skin/Wound Care: Routine skin checks 7. Fluids/Electrolytes/Nutrition: Routine in and outs. 8.  Hypertension.  Lopressor 12.5 mg twice daily, increased to 25 on 5/8.    Started Lisinopril 2.5mg  daily  Elevated, but?  Improving on 5/18, will consider further adjustments to meds if persistently elevated  Monitor with increased mobility 9.  Hyperlipidemia. Lipitor 10.  Dysphagia.  Dysphagia #3 thins liquids.  Follow-up speech therapy  Cont to advance diet as tolerated 11.  History of tobacco and alcohol use.  NicoDerm patch.  Provide counseling.    Monitor for withdrawal 12.  Moderate hypoalbuminemia  Supplement initiated on 4/28 13.  WBCs  WBCs 3.7 on 5/17  Cont to monitor 13.  AKI-resolved with IVF  Echocardiogram reviewed, EF ~60%  IVF DC'd on 5/17  Creatinine 0.84 on 5/17 14.  Sepsis  See #12  Remains afebrile  UA suspicious, urine culture multiple species; repeat UA suspicious again, urine culture  insignificant growth  Blood cultures NG; repeat blood cultures no growth  Chest x-ray personally reviewed again, unremarkable for infection  Influenza negative  Covid negative  Empiric Septra d/ced, IV Vanco and cefepime DC'd  Procalcitonin within normal limits on 5/14  ID consulted-recommended CT abdomen/pelvis and chest-unremarkable for infection, + atelectasis.  ID signed off   LOS: 21 days A FACE TO FACE EVALUATION WAS PERFORMED  Videl Nobrega Lorie Phenix 07/28/2019, 8:07 AM

## 2019-07-28 NOTE — Progress Notes (Signed)
Physical Therapy Weekly Progress Note  Patient Details  Name: Russell Russell MRN: [id number] Date of Birth: 04/22/1950  Beginning of progress report period: July 08, 2019 End of progress report period: Jul 28, 2019  Today's Date: 07/28/2019 PT Individual Time: 1000-1055 PT Individual Time Calculation (min): 55 min   Patient has met 0 of 3 short term goals. Pt has recently recovered from medical complications with pt experiencing multiple episodes of fevers which resulted in pt's functional decline during therapy last week. However, since recovering, pt's mobility status has returned to level he was at prior. Pt currently requires CGA for bed mobility, mod A for squat<>pivot and stand<>pivot transfers, min/mod A for sit<>stand, and +2 assist to ambulate 36ft in LiteGait. Pt continues to be limited by L hemiparesis, pushing to L, poor midline orientation, and decreased motor control/planning.   Patient continues to demonstrate the following deficits muscle weakness, impaired timing and sequencing, unbalanced muscle activation, decreased coordination and decreased motor planning and decreased sitting balance, decreased standing balance, decreased postural control, hemiplegia and decreased balance strategies and therefore will continue to benefit from skilled PT intervention to increase functional independence with mobility.  Patient progressing toward long term goals..  Continue plan of care.  PT Short Term Goals Week 3:  PT Short Term Goal 1 (Week 3): pt will transfer sit<>stand CGA PT Short Term Goal 1 - Progress (Week 3): Progressing toward goal PT Short Term Goal 2 (Week 3): Pt will ambulate 25ft with LRAD max A of 1 PT Short Term Goal 2 - Progress (Week 3): Progressing toward goal PT Short Term Goal 3 (Week 3): Pt will perform WC mobility 50ft PT Short Term Goal 3 - Progress (Week 3): Progressing toward goal Week 4:  PT Short Term Goal 1 (Week 4): STG=LTG due to LOS  Skilled Therapeutic  Interventions/Progress Updates:  Ambulation/gait training;Discharge planning;Functional mobility training;Psychosocial support;Therapeutic Activities;Balance/vestibular training;Disease management/prevention;Neuromuscular re-education;Therapeutic Exercise;Wheelchair propulsion/positioning;Cognitive remediation/compensation;DME/adaptive equipment instruction;Splinting/orthotics;UE/LE Strength taining/ROM;Pain management;Community reintegration;Functional electrical stimulation;Patient/family education;Stair training;UE/LE Coordination activities   Today's Interventions: Received pt sitting in recliner, pt agreeable to therapy, and denied any pain during session. Session focused on functional mobility/transfers, LE strength, dynamic standing balance/coordination, NMR, motor control/planning, and improved activity tolerance. Therapist switched out TIS WC for manual WC to trial. Pt transferred stand<>pivot recliner<>WC mod A to L. Pt performed WC mobility 50ft using L hemi technique and min A with verbal and hand over hand cues for technique. Pt transferred sit<>stand min A in standing frame and worked on dynamic standing balance with emphasis on weight shifting to the R and L UE fine motor control and reaching across midline to place pegs in pegboard using mirror for visual feedback. Pt able to place 2 pegs x 1 trial and 3 pegs x 1 trial in pegboard with min A and increased time for motor planning with seated rest breaks in between. Pt constantly stating "my legs are tired" while standing. Therapist educated pt on importance of improving standing tolerance for ambulation, strength, and functional mobility. While standing pt required verbal and tactile cues for weight shifting to R, reaching with L UE and upright posture. Pt performed standing mini-squats in standing frame x3 reps. Pt with increased difficulty weight shifting to R despite verbal and tactile cues due to LE fatigue. Pt transported back to room in WC  total assist. Pt transferred WC<>recliner stand<>pivot mod A. Concluded session with pt semi-reclined in recliner, needs within reach, and seatbelt alarm on.   Therapy Documentation Precautions:  Precautions Precautions: Fall   Precaution Comments: permissive HTN initially (<220/120) then normotension, L sided weakness, aspiration precautions (dysphagia 2 with thins), ; L hemiparesis Restrictions Weight Bearing Restrictions: No  Therapy/Group: Individual Therapy Alfonse Alpers PT, DPT   07/28/2019, 7:27 AM

## 2019-07-29 ENCOUNTER — Inpatient Hospital Stay (HOSPITAL_COMMUNITY): Payer: BC Managed Care – PPO

## 2019-07-29 ENCOUNTER — Inpatient Hospital Stay (HOSPITAL_COMMUNITY): Payer: BC Managed Care – PPO | Admitting: Occupational Therapy

## 2019-07-29 MED ORDER — LISINOPRIL 5 MG PO TABS
5.0000 mg | ORAL_TABLET | Freq: Every day | ORAL | Status: DC
  Administered 2019-07-29 – 2019-08-05 (×8): 5 mg via ORAL
  Filled 2019-07-29 (×7): qty 1

## 2019-07-29 NOTE — Progress Notes (Signed)
Occupational Therapy Session Note  Patient Details  Name: Meshach Nezat MRN: [id number] Date of Birth: Jan 30, 1951  Today's Date: 07/29/2019 OT Individual Time: 1420-1500 OT Individual Time Calculation (min): 40 min    Short Term Goals: Week 4:  OT Short Term Goal 1 (Week 4): STG = LTGs due to remaining LOS  Skilled Therapeutic Interventions/Progress Updates:    Treatment session with focus on functional transfers, sit > stand, and Lt attention and LUE NMR.  Pt received in recliner initially refusing therapy session due to fatigue but ultimately agreeable.  Completed squat pivot transfer to Lt mod assist due to decreased weight shift.  Engaged in sit > stand in [place] to assess for increased safety with nursing staff.  Pt able to complete sit <> stand in [place] with min assist to CGA, demonstrating decreased Lt lean in partial stand on [place] - recommend use of [place] for transfers with nursing staff to continue focus on trunk control.  Engaged in [place] attention with use of mirror.  Engaged in visual scanning on mirror while increasing challenge to locate numbers in numerical order forwards, backwards, and then trail making (1, A, 2, etc).  Pt demonstrating increased shoulder ROM with reaching to 90* against gravity, requiring support at elbow and wrist to facilitate further reach.  Min cues for location of numbers in Lt visual field and max cues with increased challenge with trail making task.  Returned to bed at end of session squat pivot min assist to Lt.  Pt left semi-reclined with all needs in reach.  Therapy Documentation Precautions:  Precautions Precautions: Fall Precaution Comments: permissive HTN initially (<220/120) then normotension, L sided weakness, aspiration precautions (dysphagia 2 with thins), ; L hemiparesis Restrictions Weight Bearing Restrictions: No General:   Vital Signs: Therapy Vitals Temp: 97.8 F (36.6 C) Pulse Rate: 79 Resp: 20 BP: 95/66 Patient  Position (if appropriate): Lying Oxygen Therapy SpO2: 98 % O2 Device: Room Air Pain: Pt with no c/o pain  Therapy/Group: Individual Therapy  [name] 07/29/2019, 3:56 PM

## 2019-07-29 NOTE — Progress Notes (Signed)
[place] PHYSICAL MEDICINE & REHABILITATION PROGRESS NOTE  Subjective/Complaints: Patient seen sitting up in bed this morning.  He states he slept well overnight.  He notes improvement in strength.  ROS: Denies fevers, chills, CP, SOB, N/V/D  Objective: Vital Signs: Blood pressure (!) 138/99, pulse 67, temperature 98 F (36.7 C), temperature source Oral, resp. rate 18, height 5\' 10"  (1.778 m), weight 58.3 kg, SpO2 100 %. No results found. Recent Labs    07/27/19 0542  WBC 3.7*  HGB 14.3  HCT 42.0  PLT 271   Recent Labs    07/27/19 0542  NA 142  K 4.0  CL 107  CO2 27  GLUCOSE 101*  BUN <5*  CREATININE 0.84  CALCIUM 9.2    Physical Exam: BP (!) 138/99 (BP Location: Left Arm)   Pulse 67   Temp 98 F (36.7 C) (Oral)   Resp 18   Ht 5\' 10"  (1.778 m)   Wt 58.3 kg   SpO2 100%   BMI 18.44 kg/m  Constitutional: No distress . Vital signs reviewed. HENT: Normocephalic.  Atraumatic. Eyes: EOMI. No discharge. Cardiovascular: No JVD. Respiratory: Normal effort.  No stridor. GI: Non-distended. Skin: Warm and dry.  Intact. Psych: Normal mood.  Normal behavior. Musc: No edema in extremities.  No tenderness in extremities. Neuro: Alert Motor: Left upper extremity: Shoulder abduction 4/5, distally 4/5, with apraxia and ataxia, stable Left lower extremity: Hip flexion 4/5, knee extension 4 +/5, ankle dorsiflexion 4+/5 Dysarthria, stable Left facial weakness, stable.   Assessment/Plan: 1. Functional deficits secondary to multiple anterior and posterior right brain infarcts which require 3+ hours per day of interdisciplinary therapy in a comprehensive inpatient rehab setting.  Physiatrist is providing close team supervision and 24 hour management of active medical problems listed below.  Physiatrist and rehab team continue to assess barriers to discharge/monitor patient progress toward functional and medical goals  Care Tool:  Bathing    Body parts bathed by patient:  Left arm, Chest, Abdomen, Front perineal area, Buttocks, Right upper leg, Left upper leg, Face   Body parts bathed by helper: Right arm     Bathing assist Assist Level: Moderate Assistance - Patient 50 - 74%     Upper Body Dressing/Undressing Upper body dressing   What is the patient wearing?: Hospital gown only    Upper body assist Assist Level: Minimal Assistance - Patient > 75%    Lower Body Dressing/Undressing Lower body dressing      What is the patient wearing?: Pants, Incontinence brief     Lower body assist Assist for lower body dressing: Moderate Assistance - Patient 50 - 74%     Toileting Toileting Toileting Activity did not occur (Clothing management and hygiene only): N/A (no void or bm)  Toileting assist Assist for toileting: 2 Helpers     Transfers Chair/bed transfer  Transfers assist     Chair/bed transfer assist level: Moderate Assistance - Patient 50 - 74%     Locomotion Ambulation   Ambulation assist   Ambulation activity did not occur: Safety/medical concerns(L hemi, decreased postural control/balance, fatigue)  Assist level: 2 helpers Assistive device: Lite Gait Max distance: 15ft   Walk 10 feet activity   Assist  Walk 10 feet activity did not occur: Safety/medical concerns(L hemi, decreased postural control/balance, fatigue)  Assist level: 2 helpers Assistive device: Lite Gait   Walk 50 feet activity   Assist Walk 50 feet with 2 turns activity did not occur: Safety/medical concerns(L hemi, decreased postural control/balance,  fatigue)         Walk 150 feet activity   Assist Walk 150 feet activity did not occur: Safety/medical concerns(L hemi, decreased postural control/balance, fatigue)         Walk 10 feet on uneven surface  activity   Assist Walk 10 feet on uneven surfaces activity did not occur: Safety/medical concerns(L hemi, decreased postural control/balance, fatigue)         Wheelchair     Assist  Will patient use wheelchair at discharge?: Yes Type of Wheelchair: Manual    Wheelchair assist level: Minimal Assistance - Patient > 75% Max wheelchair distance: 82ft    Wheelchair 50 feet with 2 turns activity    Assist        Assist Level: Minimal Assistance - Patient > 75%   Wheelchair 150 feet activity     Assist     Assist Level: Dependent - Patient 0%      Medical Problem List and Plan: 1.  Left side weakness secondary to multiple scattered anterior and posterior right brain infarcts.  Status post loop recorder  Continue CIR  Repeat head CT personally reviewed, unchanged  Team conference today to discuss current and goals and coordination of care, home and environmental barriers, and discharge planning with nursing, case manager, and therapies.  2.  Antithrombotics: -DVT/anticoagulation: Lovenox             -antiplatelet therapy: Aspirin 325 mg daily and Plavix 75 mg daily x3 months then aspirin alone 3. Pain Management: Tylenol as needed. Well controlled 4. Mood: Provide emotional support             -antipsychotic agents: N/A 5. Neuropsych: This patient is ?fully capable of making decisions on his own behalf. 6. Skin/Wound Care: Routine skin checks 7. Fluids/Electrolytes/Nutrition: Routine in and outs. 8.  Hypertension.  Lopressor 12.5 mg twice daily, increased to 25 on 5/8.    Started Lisinopril 2.5mg  daily, increased to 5 on 5/19  Monitor with increased mobility 9.  Hyperlipidemia. Lipitor 10.  Dysphagia.  Dysphagia #3 thins liquids.  Follow-up speech therapy  Cont to advance diet as tolerated 11.  History of tobacco and alcohol use.  NicoDerm patch.  Provide counseling.    Monitor for withdrawal 12.  Moderate hypoalbuminemia  Supplement initiated on 4/28 13.  WBCs  WBCs 3.7 on 5/17  RA latex elevated, CMV elevated - will follow up with ID.  Cont to monitor 13.  AKI-resolved with IVF  Echocardiogram reviewed, EF ~60%  IVF DC'd on  5/17  Creatinine 0.84 on 5/17 14.  Sepsis  See #12  Remains afebrile  UA suspicious, urine culture multiple species; repeat UA suspicious again, urine culture insignificant growth  Blood cultures NG; repeat blood cultures no growth  Chest x-ray personally reviewed again, unremarkable for infection  Influenza negative  Covid negative  Empiric Septra d/ced, IV Vanco and cefepime DC'd  Procalcitonin within normal limits on 5/14  ID consulted-recommended CT abdomen/pelvis and chest-unremarkable for infection, + atelectasis.  ID signed off  See #13   LOS: 22 days A FACE TO [place] 07/29/2019, 7:36 AM

## 2019-07-29 NOTE — Progress Notes (Signed)
Team Conference Report to Patient/Family  Team Conference discussion was reviewed with the patient and caregiver, including goals, any changes in plan of care and target discharge date.  Patient and caregiver express understanding and are in agreement.  The patient has a target discharge date of 08/05/19.  Russell Russell 07/29/2019, 2:40 PM Patient ID: Russell Russell, male   DOB: [birth year], 69 y.o.   MRN: [id number]

## 2019-07-29 NOTE — Progress Notes (Signed)
Physical Therapy Session Note  Patient Details  Name: Russell Russell MRN: [id number] Date of Birth: [birth year]  Today's Date: 07/29/2019 PT Individual Time: [id number] PT Individual Time Calculation (min): 72 min   Short Term Goals: Week 3:  PT Short Term Goal 1 (Week 3): pt will transfer sit<>stand CGA PT Short Term Goal 1 - Progress (Week 3): Progressing toward goal PT Short Term Goal 2 (Week 3): Pt will ambulate 16ft with LRAD max A of 1 PT Short Term Goal 2 - Progress (Week 3): Progressing toward goal PT Short Term Goal 3 (Week 3): Pt will perform WC mobility 35ft PT Short Term Goal 3 - Progress (Week 3): Progressing toward goal Week 4:  PT Short Term Goal 1 (Week 4): STG=LTG due to LOS  Skilled Therapeutic Interventions/Progress Updates:   Received pt sitting in TIS WC, pt agreeable to therapy, and denied any pain during session. Session focused on functional mobility/transfers, LE strength, dynamic standing balance/coordination, motor control/planning, midline orientation, and improved endurance with activity. Therapist located 18x18 manual WC for pt and replaced TIS WC with manual. Pt transferred TIS WC<>manual WC via stand<>pivot mod A. Pt performed WC mobility 27ft with mod A and additional 157ft with min A to avoid running into wall on L side using L hemi technique and increased time. In rehab apartment pt transferred WC<>couch mod A. Pt educated on anterior weight shifting and clearing buttocks with transfer. Pt transported to dayroom in [facility] total assist and transferred squat<>pivot WC<>mat min A. Pt transferred sit<>stand min A x 3 trials with L UE supported around therapist and worked on upright posture and midline orientation in standing. Pt required verbal and tactile cues for R weight shifting, L knee extension, and bilateral hip extension. Pt required max encouragement to remain standing, as he frequently attempted to return to sitting position stating "my leg is tired". Therapist  educated pt on importance of standing tolerance and weight bearing for improved LE strength and function. Attempted pre-gait stepping with R LE to target tape on floor however pt with increased fear of falling due to lack of support on R side and returned to sitting position. Worked on standing mini-squats 2x3 reps and 1x4 reps with L UE supported around therapist and using mirror for visual feedback. Pt required cues to weight shift to R, extend bilateral hips once standing, and push through LEs. Pt transferred sit<>stand min A with L UE around therapist and practiced reaching outside BOS to grab tape on side table on R and place it on upper R corner of mirror and then take it back off x 3 trials. Pt required manual facilitation to block L knee buckling during activity and cues to weight shift to R and slow down to improve motor control/coordination. Pt transferred mat<>WC stand<>pivot min A and was transported back to room in [facility] total assist. Pt transferred WC<>recliner stand<>pivot min A. Concluded session with pt semi-reclined in recliner, needs within reach, and seatbelt alarm on.   Therapy Documentation Precautions:  Precautions Precautions: Fall Precaution Comments: permissive HTN initially (<220/120) then normotension, L sided weakness, aspiration precautions (dysphagia 2 with thins), ; L hemiparesis Restrictions Weight Bearing Restrictions: No  Therapy/Group: Individual Therapy Alfonse Alpers PT, DPT   07/29/2019, 7:29 AM

## 2019-07-29 NOTE — Patient Care Conference (Signed)
Inpatient RehabilitationTeam Conference and Plan of Care Update Date: 07/29/2019   Time: 3:31 PM    Patient Name: Russell Russell      Medical Record Number: [id number]  Date of Birth: November 24, 1950 Sex: Male         Room/Bed: 4M01C/4M01C-01 Payor Info: Payor: [place] / Plan: BCBS COMM PPO / Product Type: *No Product type* /    Admit Date/Time:  07/07/2019  3:39 PM  Primary Diagnosis:  Cerebrovascular accident (CVA) of right basal ganglia [facility])  Patient Active Problem List   Diagnosis Date Noted  . AKI (acute kidney injury) ([place])   . Fever   . Sepsis without acute organ dysfunction ([place])   . Elevated BUN   . Blood pressure increase diastolic   . Labile blood pressure   . Leukopenia   . Benign essential HTN   . Hypoalbuminemia   . Cerebrovascular accident (CVA) of right basal ganglia ([place]) 07/07/2019  . Acute ischemic stroke ([place])   . Alcohol abuse   . Dyslipidemia   . Dysphagia, post-stroke   . Polycythemia   . Stroke ([place]) 06/30/2019  . Prediabetes 12/23/2016  . Elevated LFTs 12/23/2016  . Pure hypercholesterolemia 12/21/2016  . Alcoholism ([place]) 12/21/2016  . Eczema 09/18/2013  . Essential hypertension 09/18/2013  . Gastroesophageal reflux disease without esophagitis 09/18/2013  . Tobacco abuse 09/18/2013    Expected Discharge Date: Expected Discharge Date: 08/05/19  Team Members Present: Physician leading conference: Dr. Delice Lesch Care Coodinator Present: Nestor Lewandowsky, RN, BSN, CRRN;Christina Sampson Goon, BSW Nurse Present: Serena Croissant, LPN PT Present: Becky Sax, PT OT Present: [name], OT SLP Present: Jettie Booze, CF-SLP PPS Coordinator present : Gunnar Fusi, SLP     Current Status/Progress Goal Weekly Team Focus  Bowel/Bladder   patient is continent of bowel and bladder. LBM 07/26/19  maintain continence  offer q 2-3 hours and PRN toileting needs   Swallow/Nutrition/ Hydration   regular and thin, intermittent supervision A   Supervision  carryover of swallow strategies and discharge swallow goals   ADL's   min assist bathing and dressing with exception of requiring mod assist for dynamic standing balance during LB bathing/dressing, min-mod assist squat pivot transfers, increased incorporation of LUE into self-care tasks still not quite functional  Min assist overall  ADL retraining, transfers, trunk control with dynamic sitting and standing, LUE NMR, pt/family education   Mobility   bed mobility CGA, stand<>pivot transfers mod A, gait 69ft LiteGait +2 assist  min A  functional mobility/transfers, LE strength, NMR, motor control, dynamic sitting/standing balance, gait training, endurance   Communication   Supervision A 90% intelligibility  Mod I  RMT and carryover of intelligibility strategies   Safety/Cognition/ Behavioral Observations  Min A  Supervision-Min  basic to mildly complex problem solving, emergent awareness and recall strategies   Pain   no c/o pain  remain pain free  assess q ashift and PRN pain assessment   Skin   eczema right and left leg  prevent skin from breakdwon and infection  continue application of eucerin cream to areas where there is eczema, skin assessment q shift and PRN    Rehab Goals Patient on target to meet rehab goals: Yes Rehab Goals Revised: Patient on target with goals currently *See Care Plan and progress notes for long and short-term goals.     Barriers to Discharge  Current Status/Progress Possible Resolutions Date Resolved   Nursing  PT  Other (comments);Inaccessible home environment  L pushing, 1 STE              OT                  SLP                Care Coordinator Medical stability sw not awar eof any currently on target          Discharge Planning/Teaching Needs:  Goal to to discharge home with spouse on 5/26  Will schedule if recommended   Team Discussion:  FUO-work up negative for cause. Md adjust BP medications. Team notes pushing  improved, sequencing, and recall improved with visual cues. Requested family education x 2 days with OP PT, OT and SLP services recommended.  Revisions to Treatment Plan:  Memory book initiated.    Medical Summary Current Status: Left side weakness secondary to multiple scattered anterior and posterior right brain infarcts Weekly Focus/Goal: Improve mobility, dysphagia, infection, BP  Barriers to Discharge: Decreased family/caregiver support;Medical stability;Incontinence   Possible Resolutions to Barriers: Therapies, follow labs, continue to advance diet as tolerated, optimize BP meds, no further recs per ID at present   Continued Need for Acute Rehabilitation Level of Care: The patient requires daily medical management by a physician with specialized training in physical medicine and rehabilitation for the following reasons: Direction of a multidisciplinary physical rehabilitation program to maximize functional independence : Yes Medical management of patient stability for increased activity during participation in an intensive rehabilitation regime.: Yes Analysis of laboratory values and/or radiology reports with any subsequent need for medication adjustment and/or medical intervention. : Yes   I attest that I was present, lead the team conference, and concur with the assessment and plan of the team.   Dorien Chihuahua B 07/29/2019, 3:31 PM

## 2019-07-29 NOTE — Plan of Care (Signed)
  Problem: RH Ambulation Goal: LTG Patient will ambulate in controlled environment (PT) Description: LTG: Patient will ambulate in a controlled environment, # of feet with assistance (PT). Outcome: Not Applicable Flowsheets (Taken 07/29/2019 1256) LTG: Pt will ambulate in controlled environ  assist needed:: (D/C) -- Note: D/C Goal: LTG Patient will ambulate in home environment (PT) Description: LTG: Patient will ambulate in home environment, # of feet with assistance (PT). Outcome: Not Applicable Flowsheets (Taken 07/29/2019 1256) LTG: Pt will ambulate in home environ  assist needed:: (D/C) -- Note: D/C   Problem: RH Stairs Goal: LTG Patient will ambulate up and down stairs w/assist (PT) Description: LTG: Patient will ambulate up and down # of stairs with assistance (PT) Outcome: Not Applicable Flowsheets (Taken 07/29/2019 1256) LTG: Pt will ambulate up/down stairs assist needed:: (D/C) -- Note: D/C

## 2019-07-29 NOTE — Progress Notes (Signed)
Occupational Therapy Session Note  Patient Details  Name: Russell Russell MRN: [id number] Date of Birth: [birth year]  Today's Date: 07/29/2019 OT Individual Time: 4315-4008 OT Individual Time Calculation (min): 40 min    Short Term Goals: Week 3:  OT Short Term Goal 1 (Week 3): Pt will don pants with mod assist sit > stand OT Short Term Goal 2 (Week 3): Pt will complete sit > stand with min assist during self-care tasks OT Short Term Goal 2 - Progress (Week 3): Met OT Short Term Goal 3 (Week 3): Pt will complete UB dressing with min assist OT Short Term Goal 3 - Progress (Week 3): Met OT Short Term Goal 4 (Week 3): Pt will complete toilet transfers with mod assist to Rt and Lt OT Short Term Goal 4 - Progress (Week 3): Met  Skilled Therapeutic Interventions/Progress Updates:    Pt resting in w/c upon arrival and agreeable to therapy.  Pt transitioned to gym for table tasks. OT intervention with focus on attention to L, and LUE functional tasks.  Pt presented with pipe tree structure and instructed to assemble using his LUE appropriately. Pt required min A for use of LUE with open chain tasks.  Pt required min verbal cues to continue to use his LUE. Pt completed and disassembled structure. Pt also challenged with grasping large pegs and pushing in holes on peg board. Pt required min A to complete task and max verbal cues to fully extend fingers before grasping. Pt returned to room and remained in w/c with all needs within reach and belt alarm activated.   Therapy Documentation Precautions:  Precautions Precautions: Fall Precaution Comments: permissive HTN initially (<220/120) then normotension, L sided weakness, aspiration precautions (dysphagia 2 with thins), ; L hemiparesis Restrictions Weight Bearing Restrictions: No  Pain:  Pt denies pain this morning   Therapy/Group: Individual Therapy  Leroy Libman 07/29/2019, 11:58 AM

## 2019-07-29 NOTE — Plan of Care (Signed)
  Problem: Consults Goal: RH STROKE PATIENT EDUCATION Description: See Patient Education module for education specifics  Outcome: Progressing Goal: Nutrition Consult-if indicated Outcome: Progressing   Problem: RH SKIN INTEGRITY Goal: RH STG SKIN FREE OF INFECTION/BREAKDOWN Description: Skin free of breakdown and infection with Min assist Outcome: Progressing Goal: RH STG MAINTAIN SKIN INTEGRITY WITH ASSISTANCE Description: STG Maintain Skin Integrity With min Assistance.  Outcome: Progressing   Problem: RH SAFETY Goal: RH STG ADHERE TO SAFETY PRECAUTIONS W/ASSISTANCE/DEVICE Description: STG Adhere to Safety Precautions With min Assistance/Device.  Outcome: Progressing Goal: RH STG DECREASED RISK OF FALL WITH ASSISTANCE Description: STG Decreased Risk of Fall With min Assistance.  Outcome: Progressing   Problem: RH KNOWLEDGE DEFICIT Goal: RH STG INCREASE KNOWLEDGE OF HYPERTENSION Description: Patient/family will be able to describe management of hypertension including medication, diet, and lifestyle interventions with cues/handouts Outcome: Progressing Goal: RH STG INCREASE KNOWLEDGE OF DYSPHAGIA/FLUID INTAKE Description: Patient/Family will be able to describe and demonstrate safe swallowing techniques and identify foods in D2 diet scope with cues/handouts Outcome: Progressing Goal: RH STG INCREASE KNOWLEGDE OF HYPERLIPIDEMIA Description: Patient/family will be able to describe management of hyperlipidemia including medication, diet, and lifestyle interventions with cues/handouts Outcome: Progressing Goal: RH STG INCREASE KNOWLEDGE OF STROKE PROPHYLAXIS Description: Patient/family will be able to describe stroke prophylaxis including medication, diet, and lifestyle interventions with cues/handouts Outcome: Progressing

## 2019-07-29 NOTE — Progress Notes (Signed)
Occupational Therapy Weekly Progress Note  Patient Details  Name: Russell Russell MRN: [id number] Date of Birth: 04/21/1950  Beginning of progress report period: Jul 22, 2019 End of progress report period: Jul 29, 2019  Today's Date: 07/29/2019 OT Individual Time: 1657-9038 OT Individual Time Calculation (min): 45 min    Patient has met 3 of 3 short term goals.  Pt is making steady progress towards goals.  During previous reporting period pt with elevated body temp, increased diarrhea and resulting increased pushing and impaired cognition impacting therapeutic activities.  Pt much improved 2nd portion of this reporting period with increased postural control with decreased pushing.  Pt maintains dynamic sitting with CGA and min-mod assist for dynamic standing balance.  Pt able to complete sit > stand during self-care tasks with min assist and requires up to mod assist due to dynamic standing/reaching to complete LB dressing and hygiene.  Pt is demonstrating increased incorporation of LUE during self-care tasks, does require physical assistance when reaching above head height/overhead however pt able to incorporate during self-care tasks.  Pt will benefit from hands on education with wife prior to d/c to address pushing and functional transfers as well as self-care retraining.  Patient continues to demonstrate the following deficits: muscle weakness,decreased cardiorespiratoy endurance,impaired timing and sequencing, abnormal tone, unbalanced muscle activation, motor apraxia, decreased coordination and decreased motor planning,decreased visual perceptual skills,decreased midline orientation, decreased attention to left and decreased motor planning,decreased attention, decreased awareness, decreased problem solving, decreased safety awareness and decreased memoryand decreased sitting balance, decreased standing balance, decreased postural control, hemiplegia and decreased balance strategies and  therefore will continue to benefit from skilled OT intervention to enhance overall performance with BADL and Reduce care partner burden.  Patient progressing toward long term goals..  Continue plan of care.  OT Short Term Goals Week 3:  OT Short Term Goal 1 (Week 3): Pt will don pants with mod assist sit > stand OT Short Term Goal 2 (Week 3): Pt will complete sit > stand with min assist during self-care tasks OT Short Term Goal 2 - Progress (Week 3): Met OT Short Term Goal 3 (Week 3): Pt will complete UB dressing with min assist OT Short Term Goal 3 - Progress (Week 3): Met OT Short Term Goal 4 (Week 3): Pt will complete toilet transfers with mod assist to Rt and Lt OT Short Term Goal 4 - Progress (Week 3): Met Week 4:  OT Short Term Goal 1 (Week 4): STG = LTGs due to remaining LOS  Skilled Therapeutic Interventions/Progress Updates:    Treatment session with focus on functional transfers, dynamic sitting and standing balance and functional use of LUE during self-care tasks.  Pt received supine in bed agreeable to shower. Pt completed bed mobility with CGA and stand pivot transfer bed > w/c with Min assist to Rt.  Completed squat pivot transfer to tub bench in room shower with mod assist with facilitation for weight shift.  Pt demonstrating improved sitting balance in shower.  Able to incorporate use of LUE during bathing and challenged pt to complete bimanual tasks when applying soap to wash cloth.  Min assist sit > stand during bathing, pt able to maintain standing with little to no pushing in standing while washing buttocks.  Completed dressing at sit > stand level at sink with use of mirror for additional feedback on postural control.  Pt again able to thread BLE with min assist and able to pull pants over hips, therapist providing hand over hand  assist to incorporate use of LUE when pulling pants over Lt hip.  Pt completed oral care with supervision/cues, incorporating use of LUE when opening and  applying toothpaste.  Utilized shoe funnel when donning shoes, required assistance this session due to height of w/c and inability for feet to fully reach floor.  Pt remained upright in TIS w/c with seat belt alarm on, all needs, in reach, and breakfast tray setup.  Therapy Documentation Precautions:  Precautions Precautions: Fall Precaution Comments: permissive HTN initially (<220/120) then normotension, L sided weakness, aspiration precautions (dysphagia 2 with thins), ; L hemiparesis Restrictions Weight Bearing Restrictions: No Pain:  Pt with no c/o pain   Therapy/Group: Individual Therapy  [name] 07/29/2019, 9:09 AM

## 2019-07-30 ENCOUNTER — Inpatient Hospital Stay (HOSPITAL_COMMUNITY): Payer: BC Managed Care – PPO | Admitting: Physical Therapy

## 2019-07-30 ENCOUNTER — Inpatient Hospital Stay (HOSPITAL_COMMUNITY): Payer: BC Managed Care – PPO | Admitting: Occupational Therapy

## 2019-07-30 ENCOUNTER — Inpatient Hospital Stay (HOSPITAL_COMMUNITY): Payer: BC Managed Care – PPO

## 2019-07-30 ENCOUNTER — Inpatient Hospital Stay (HOSPITAL_COMMUNITY): Payer: BC Managed Care – PPO | Admitting: Speech Pathology

## 2019-07-30 NOTE — Progress Notes (Signed)
Physical Therapy Session Note  Patient Details  Name: Russell Russell MRN: [id number] Date of Birth: Apr 04, 1950  Today's Date: 07/30/2019 PT Individual Time: [id number] PT Individual Time Calculation (min): 55 min   Short Term Goals: Week 3:  PT Short Term Goal 1 (Week 3): pt will transfer sit<>stand CGA PT Short Term Goal 1 - Progress (Week 3): Progressing toward goal PT Short Term Goal 2 (Week 3): Pt will ambulate 71ft with LRAD max A of 1 PT Short Term Goal 2 - Progress (Week 3): Progressing toward goal PT Short Term Goal 3 (Week 3): Pt will perform WC mobility 15ft PT Short Term Goal 3 - Progress (Week 3): Progressing toward goal Week 4:  PT Short Term Goal 1 (Week 4): STG=LTG due to LOS  Skilled Therapeutic Interventions/Progress Updates:   Received pt supine in bed, pt agreeable to therapy after extensive convincing, and denied any pain during session. Session focused on functional mobility/transfers, LE strength, dynamic sitting/standing balance/coordination, gait training, midline orientation, motor control/planning, NMR, and improved activity tolerance. Pt transferred supine<>sitting EOB with HOB elevated and supervision. Donned shoes sitting EOB with total assist for time management purposes. Pt transferred bed<>WC stand<>pivot min A with cues for hand placement, anterior weight shifting, and foot placement when turning. Pt performed WC mobility 50ft with supervision using L hemi technique and max verbal cues to avoid running into wall on L side. Pt transferred sit<>stand CGA with 1 rail on R and worked on pre-gait stepping with R LE x10 reps using mirror for visual feedback min A. Pt able to maintain L knee extension without assist during activity. Pt ambulated 62ft x 1 and 25ft x 1 trials with rail on R side mod A +2 for WC follow using mirror for visual feedback. Pt required verbal cues for weight shifting R and weight shifting over R LE when stepping, upright posture, increased step  length, and head control. Therapist provided manual facilitation to control L knee buckling and to advance L LE. Pt transferred WC<>mat stand<>pivot min A. Pt transferred sit<>stand min A and worked on dynamic standing balance and reaching outside BOS to grab clothespins from table on R side with R UE and clip them to basketball net (placed to R of pt) and take them off x 3 trials with min A overall. Pt required verbal/tactile cues for anterior weight shifting, L knee extension, and bilateral hip extension. Pt with tendency to lean posteriorly and with flexed trunk. Pt required multiple rest breaks throughout session due to increased fatigue. Pt transferred mat<>WC stand<>pivot min A and transported back to room in [facility] total assist. Concluded session with pt sitting in WC, needs within reach, and seatbelt alarm on.   Therapy Documentation Precautions:  Precautions Precautions: Fall Precaution Comments: permissive HTN initially (<220/120) then normotension, L sided weakness, aspiration precautions (dysphagia 2 with thins), ; L hemiparesis Restrictions Weight Bearing Restrictions: No  Therapy/Group: Individual Therapy Alfonse Alpers PT, DPT   07/30/2019, 7:34 AM

## 2019-07-30 NOTE — Progress Notes (Signed)
Russell Russell PHYSICAL MEDICINE & REHABILITATION PROGRESS NOTE  Subjective/Complaints: Patient seen laying in bed this morning.  He states he slept well overnight.  He states he still sleepy this morning.  ROS: Denies fevers, chills, CP, SOB, N/V/D  Objective: Vital Signs: Blood pressure (!) 130/96, pulse 69, temperature 98.4 F (36.9 C), resp. rate 16, height 5\' 10"  (1.778 m), weight 58.3 kg, SpO2 100 %. No results found. No results for input(s): WBC, HGB, HCT, PLT in the last 72 hours. No results for input(s): NA, K, CL, CO2, GLUCOSE, BUN, CREATININE, CALCIUM in the last 72 hours.  Physical Exam: BP (!) 130/96   Pulse 69   Temp 98.4 F (36.9 C)   Resp 16   Ht 5\' 10"  (1.778 m)   Wt 58.3 kg   SpO2 100%   BMI 18.44 kg/m  Constitutional: No distress . Vital signs reviewed. HENT: Normocephalic.  Atraumatic. Eyes: EOMI. No discharge. Cardiovascular: No JVD. Respiratory: Normal effort.  No stridor. GI: Non-distended. Skin: Warm and dry.  Intact. Psych: Normal mood.  Normal behavior. Musc: No edema in extremities.  No tenderness in extremities. Neuro: Alert Motor: Left upper extremity: Shoulder abduction 4/5, distally 4/5, with apraxia and ataxia, improving Left lower extremity: Hip flexion 4/5, knee extension 4 +/5, ankle dorsiflexion 4+/5, improving Dysarthria, stable Left facial weakness, stable.   Assessment/Plan: 1. Functional deficits secondary to multiple anterior and posterior right brain infarcts which require 3+ hours per day of interdisciplinary therapy in a comprehensive inpatient rehab setting.  Physiatrist is providing close team supervision and 24 hour management of active medical problems listed below.  Physiatrist and rehab team continue to assess barriers to discharge/monitor patient progress toward functional and medical goals  Care Tool:  Bathing    Body parts bathed by patient: Left arm, Chest, Abdomen, Front perineal area, Right upper leg, Left upper  leg, Face, Right arm, Buttocks   Body parts bathed by helper: Right arm     Bathing assist Assist Level: Minimal Assistance - Patient > 75%     Upper Body Dressing/Undressing Upper body dressing   What is the patient wearing?: Pull over shirt    Upper body assist Assist Level: Supervision/Verbal cueing    Lower Body Dressing/Undressing Lower body dressing      What is the patient wearing?: Pants, Incontinence brief     Lower body assist Assist for lower body dressing: Moderate Assistance - Patient 50 - 74%     Toileting Toileting Toileting Activity did not occur Landscape architect and hygiene only): N/A (no void or bm)  Toileting assist Assist for toileting: 2 Helpers     Transfers Chair/bed transfer  Transfers assist     Chair/bed transfer assist level: Minimal Assistance - Patient > 75%     Locomotion Ambulation   Ambulation assist   Ambulation activity did not occur: Safety/medical concerns(L hemi, decreased postural control/balance, fatigue)  Assist level: 2 helpers Assistive device: Lite Gait Max distance: 36ft   Walk 10 feet activity   Assist  Walk 10 feet activity did not occur: Safety/medical concerns(L hemi, decreased postural control/balance, fatigue)  Assist level: 2 helpers Assistive device: Lite Gait   Walk 50 feet activity   Assist Walk 50 feet with 2 turns activity did not occur: Safety/medical concerns(L hemi, decreased postural control/balance, fatigue)         Walk 150 feet activity   Assist Walk 150 feet activity did not occur: Safety/medical concerns(L hemi, decreased postural control/balance, fatigue)  Walk 10 feet on uneven surface  activity   Assist Walk 10 feet on uneven surfaces activity did not occur: Safety/medical concerns(L hemi, decreased postural control/balance, fatigue)         Wheelchair     Assist Will patient use wheelchair at discharge?: Yes Type of Wheelchair: Manual     Wheelchair assist level: Minimal Assistance - Patient > 75% Max wheelchair distance: 113ft    Wheelchair 50 feet with 2 turns activity    Assist        Assist Level: Minimal Assistance - Patient > 75%   Wheelchair 150 feet activity     Assist     Assist Level: Dependent - Patient 0%      Medical Problem List and Plan: 1.  Left side weakness secondary to multiple scattered anterior and posterior right brain infarcts.  Status post loop recorder  Continue CIR  Repeat head CT personally reviewed, unchanged 2.  Antithrombotics: -DVT/anticoagulation: Lovenox             -antiplatelet therapy: Aspirin 325 mg daily and Plavix 75 mg daily x3 months then aspirin alone 3. Pain Management: Tylenol as needed. Well controlled 4. Mood: Provide emotional support             -antipsychotic agents: N/A 5. Neuropsych: This patient is ?fully capable of making decisions on his own behalf. 6. Skin/Wound Care: Routine skin checks 7. Fluids/Electrolytes/Nutrition: Routine in and outs. 8.  Hypertension.  Lopressor 12.5 mg twice daily, increased to 25 on 5/8.    Started Lisinopril 2.5mg  daily, increased to 5 on 5/19  Labile with elevated diastolic pressures on [id number], will need to monitor to ensure to avoid hypotension  Monitor with increased mobility 9.  Hyperlipidemia. Lipitor 10.  Dysphagia.    Advance to regular diet thins with compensatory strategies  Follow-up speech therapy  Cont to advance diet as tolerated 11.  History of tobacco and alcohol use.  NicoDerm patch.  Provide counseling.    Monitor for withdrawal 12.  Moderate hypoalbuminemia  Supplement initiated on 4/28 13.  WBCs  WBCs 3.7 on 5/17, labs ordered for tomorrow  RA latex elevated, CMV elevated -discussed with ID, may consider serial checks as outpatient.  Cont to monitor 13.  AKI-resolved with IVF  Echocardiogram reviewed, EF ~60%  IVF DC'd on 5/17  Creatinine 0.84 on 5/17 14.  Sepsis  See #12  Remains  afebrile  UA suspicious, urine culture multiple species; repeat UA suspicious again, urine culture insignificant growth  Blood cultures NG; repeat blood cultures no growth  Chest x-ray personally reviewed again, unremarkable for infection  Influenza negative  Covid negative  Empiric Septra d/ced, IV Vanco and cefepime DC'd  Procalcitonin within normal limits on 5/14  ID consulted-recommended CT abdomen/pelvis and chest-unremarkable for infection, + atelectasis.  ID signed off  See #13   LOS: 23 days A FACE TO FACE EVALUATION WAS PERFORMED  Russell Russell Russell Russell 07/30/2019, 8:13 AM

## 2019-07-30 NOTE — Progress Notes (Signed)
Patient ID: Russell Russell, male   DOB: Oct 03, 1950, 69 y.o.   MRN: [id number]  DME ordered as of 07/30/19: 18X18 Wheelchair, tub transfer bench

## 2019-07-30 NOTE — Progress Notes (Signed)
Physical Therapy Session Note  Patient Details  Name: Russell Russell MRN: [id number] Date of Birth: 1950/11/30  Today's Date: 07/30/2019 PT Individual Time: 1702-1730 PT Individual Time Calculation (min): 28 min   Short Term Goals: Week 4:  PT Short Term Goal 1 (Week 4): STG=LTG due to LOS  Skilled Therapeutic Interventions/Progress Updates:   Pt received sitting in WC and agreeable to PT. Pt transported to day room in [facility]. Squat pivot transfer to and from nustep with min assist and min cues for safety and set up. nustep reciprocal movement training with supervision assist x 8 min , level 5, with intermittent cues for symmetry RvL as well as improved attention to the LUE to maintain grasp on the handle. Pt also instructed in WC mobility x 173f with min assist throughout to maintain straight trajectory and prevent veer to the L by improving use  And timing of RLE. [facility] and all needs met.         Therapy Documentation Precautions:  Precautions Precautions: Fall Precaution Comments: permissive HTN initially (<220/120) then normotension, L sided weakness, aspiration precautions (dysphagia 2 with thins), ; L hemiparesis Restrictions Weight Bearing Restrictions: No Vital Signs: Therapy Vitals Temp: 98.2 F (36.8 C) Temp Source: Oral Pulse Rate: 79 Resp: 16 BP: (!) 125/95 Patient Position (if appropriate): Lying Oxygen Therapy O2 Device: Room Air Pain: denies   Therapy/Group: Individual Therapy  ALorie Phenix5/20/2021, 5:41 PM

## 2019-07-30 NOTE — Progress Notes (Signed)
Patient ID: Russell Russell, male   DOB: 08/19/1950, 69 y.o.   MRN: [id number]  Drop arm bedside commode ordered

## 2019-07-30 NOTE — Progress Notes (Signed)
Speech Language Pathology Daily Session Note  Patient Details  Name: Russell Russell MRN: [id number] Date of Birth: 1950-11-02  Today's Date: 07/30/2019 SLP Individual Time: [id number] SLP Individual Time Calculation (min): 44 min  Short Term Goals: Week 4: SLP Short Term Goal 1 (Week 4): STG=LTG due to short ELOS  Skilled Therapeutic Interventions: Pt was seen for skilled ST targeting speech goals. SLP facilitated session with a sentence level barrier speech task, during which pt was ~80% intelligible. He required Min A verbal cues from clinician to repeat unintelligible utterances using slower rate and increased vocal intensity. However, at beginning of session he recalled said strategies with only a Supervision A question cue. Pt left sitting in chair with alarm set and needs within reach. Continue per current plan of care.        Pain Pain Assessment Pain Scale: 0-10 Pain Score: 0-No pain  Therapy/Group: Individual Therapy  Arbutus Leas 07/30/2019, 7:16 AM

## 2019-07-30 NOTE — Progress Notes (Signed)
Occupational Therapy Session Note  Patient Details  Name: Russell Russell MRN: [id number] Date of Birth: 12/11/1950  Today's Date: 07/30/2019 OT Individual Time: 0902-1015 OT Individual Time Calculation (min): 73 min    Short Term Goals: Week 4:  OT Short Term Goal 1 (Week 4): STG = LTGs due to remaining LOS  Skilled Therapeutic Interventions/Progress Updates:  Patient met lying supine in bed in agreement with OT treatment session with focus on self-care re-education, functional transfers, standing balance during BADLs, and NMR as detailed below. Supine to EOB with CGA and use of bed features with cueing for use of RLE to assist LLE toward EOB. Patient able to maintain dynamic sitting balance at EOB with CGA during BADLs this A.M. Patient found to be incontinent of urine requiring Min A and cueing for orientation to midline in standing to wash front perineal area and buttocks. UB dressing in sitting with supervision A and LB dressing in sitting/standing with Mod A to hike pants over hips in standing. Patient demonstrates increased use of LUE in functional tasks with Mod cueing. Patient set-up with breakfast meal while OT made phone call to wife about DME needs. Sticky note updated. Seated at sink level, patient completed grooming tasks with supervision A. Patient able to hold toothpaste with LUE and unscrew cap with R hand. Patient also able to manipulate faucet with L hand and increased time. Wrist/digit extension remains a difficulty for this patient. Seated towel pushes with focus on shoulder flexion/extension and gross/isolated extension/flexion of digits. Session concluded with patient seated in wc with call bell within reach, belt alarm activated, and all needs met.   Therapy Documentation Precautions:  Precautions Precautions: Fall Precaution Comments: permissive HTN initially (<220/120) then normotension, L sided weakness, aspiration precautions (dysphagia 2 with thins), ; L  hemiparesis Restrictions Weight Bearing Restrictions: No General:    Therapy/Group: Individual Therapy  Deasia Chiu R [place]Davis 07/30/2019, 7:52 AM

## 2019-07-31 ENCOUNTER — Encounter (HOSPITAL_COMMUNITY): Payer: BLUE CROSS/BLUE SHIELD

## 2019-07-31 ENCOUNTER — Inpatient Hospital Stay (HOSPITAL_COMMUNITY): Payer: BLUE CROSS/BLUE SHIELD | Admitting: Physical Therapy

## 2019-07-31 ENCOUNTER — Inpatient Hospital Stay (HOSPITAL_COMMUNITY): Payer: BLUE CROSS/BLUE SHIELD | Admitting: Speech Pathology

## 2019-07-31 ENCOUNTER — Inpatient Hospital Stay (HOSPITAL_COMMUNITY): Payer: BLUE CROSS/BLUE SHIELD

## 2019-07-31 LAB — CBC WITH DIFFERENTIAL/PLATELET
Abs Immature Granulocytes: 0.01 10*3/uL (ref 0.00–0.07)
Basophils Absolute: 0 10*3/uL (ref 0.0–0.1)
Basophils Relative: 0 %
Eosinophils Absolute: 0.2 10*3/uL (ref 0.0–0.5)
Eosinophils Relative: 5 %
HCT: 42.5 % (ref 39.0–52.0)
Hemoglobin: 14.6 g/dL (ref 13.0–17.0)
Immature Granulocytes: 0 %
Lymphocytes Relative: 32 %
Lymphs Abs: 1.5 10*3/uL (ref 0.7–4.0)
MCH: 31.5 pg (ref 26.0–34.0)
MCHC: 34.4 g/dL (ref 30.0–36.0)
MCV: 91.6 fL (ref 80.0–100.0)
Monocytes Absolute: 0.4 10*3/uL (ref 0.1–1.0)
Monocytes Relative: 9 %
Neutro Abs: 2.5 10*3/uL (ref 1.7–7.7)
Neutrophils Relative %: 54 %
Platelets: 375 10*3/uL (ref 150–400)
RBC: 4.64 MIL/uL (ref 4.22–5.81)
RDW: 15.4 % (ref 11.5–15.5)
WBC: 4.7 10*3/uL (ref 4.0–10.5)
nRBC: 0 % (ref 0.0–0.2)

## 2019-07-31 NOTE — Progress Notes (Signed)
Occupational Therapy Session Note  Patient Details  Name: Russell Russell MRN: [id number] Date of Birth: 10/26/1950  Today's Date: 07/31/2019 OT Individual Time: 1400-1445 OT Individual Time Calculation (min): 45 min    Short Term Goals: Week 1:  OT Short Term Goal 1 (Week 1): Pt will transfer to drop arm BSC with mod A +2 with LRAD/ method OT Short Term Goal 1 - Progress (Week 1): Met OT Short Term Goal 2 (Week 1): Pt will perform sit to stand with max cues with mod A in prep for clothing management OT Short Term Goal 2 - Progress (Week 1): Met OT Short Term Goal 3 (Week 1): Pt will be able to maintain static sitting balance in prep for ADL tasks with consistant mod A OT Short Term Goal 3 - Progress (Week 1): Met OT Short Term Goal 4 (Week 1): Pt will incorporate left UE in functional tasks with minimal cuing OT Short Term Goal 4 - Progress (Week 1): Met  Skilled Therapeutic Interventions/Progress Updates:    1:1. Pt received in bed agreeable to OT after aroused from deep sleep. Pt supine>sitting with CGA and increased ataxia. Pt with significant posterior lean sitting EOB after ~3 min attempting ot self feed EOB. Pt with 3 LOB totally posteriorly and unable to hold forward flexion therefore transitioned to supported sitting for self feeding. Pt requires MOD A for squat pivot transfer to w/c with VC for hand palcement. Pt completes self feeding of meal tray with MOD VC for slowing of rate of consumption. Pt putting up nearly 4 bites of food into mouth and no awareness that he hadnt swallowed. HOH A provided to use LUE to hold pudding cup for BUE task and open pop can. Exited session with pt seated in w.c belt alarm on and call light in reach  Therapy Documentation Precautions:  Precautions Precautions: Fall Precaution Comments: permissive HTN initially (<220/120) then normotension, L sided weakness, aspiration precautions (dysphagia 2 with thins), ; L hemiparesis Restrictions Weight  Bearing Restrictions: No General:   Vital Signs:  Pain:   ADL: ADL Upper Body Bathing: Minimal assistance Where Assessed-Upper Body Bathing: Sitting at sink Lower Body Bathing: Maximal assistance Where Assessed-Lower Body Bathing: Sitting at sink Upper Body Dressing: Maximal assistance Where Assessed-Upper Body Dressing: Sitting at sink Lower Body Dressing: Maximal assistance Where Assessed-Lower Body Dressing: Sitting at sink, Standing at sink Toileting: Not assessed Toilet Transfer: Not assessed Vision   Perception    Praxis   Exercises:   Other Treatments:     Therapy/Group: Individual Therapy  Tonny Branch 07/31/2019, 2:42 PM

## 2019-07-31 NOTE — Progress Notes (Signed)
Physical Therapy Session Note  Patient Details  Name: Russell Russell MRN: [id number] Date of Birth: 10-12-1950  Today's Date: 07/31/2019 PT Individual Time: 1530-1600 PT Individual Time Calculation (min): 30 min   Short Term Goals: Week 4:  PT Short Term Goal 1 (Week 4): STG=LTG due to LOS  Skilled Therapeutic Interventions/Progress Updates:   Pt received sitting in WC and agreeable to PT. Pt transported to rehab gym. Squat pivot transfer to and from mat table with min assist from PT. Sitting balance EOB to  Engage in lateral reaching task of giant connect 4 x 4 bouts. Pt able to perform lateral lean and forward leans to place game piece on board as well as pick off floor with R and L UE. Pt transported to parallel bars. Gait training in parallel bars 2 x 8 ft forward/reverse with moderate cues for posture, step length, midline and UE sequencing. Improved posture on second bout as well as ability to maintain midline. Patient returned to room and left sitting in [facility] with call bell in reach and all needs met.         Therapy Documentation Precautions:  Precautions Precautions: Fall Precaution Comments: permissive HTN initially (<220/120) then normotension, L sided weakness, aspiration precautions (dysphagia 2 with thins), ; L hemiparesis Restrictions Weight Bearing Restrictions: No    Vital Signs: Therapy Vitals Temp: 98 F (36.7 C) Pulse Rate: 79 Resp: 16 BP: 114/85 Patient Position (if appropriate): Lying Oxygen Therapy SpO2: 99 % O2 Device: Room Air Pain: denies   Therapy/Group: Individual Therapy  Lorie Phenix 07/31/2019, 6:07 PM

## 2019-07-31 NOTE — Progress Notes (Signed)
[place] PHYSICAL MEDICINE & REHABILITATION PROGRESS NOTE  Subjective/Complaints:  Had BM in his bed and aide is helping to clean him.  No complaints this morning VS stable  ROS: Denies fevers, chills, CP, SOB, N/V/D  Objective: Vital Signs: Blood pressure (!) 128/91, pulse 86, temperature 98.2 F (36.8 C), temperature source Oral, resp. rate 17, height 5\' 10"  (1.778 m), weight 60.6 kg, SpO2 99 %. No results found. Recent Labs    07/31/19 0654  WBC 4.7  HGB 14.6  HCT 42.5  PLT 375   No results for input(s): NA, K, CL, CO2, GLUCOSE, BUN, CREATININE, CALCIUM in the last 72 hours.  Physical Exam: BP (!) 128/91   Pulse 86   Temp 98.2 F (36.8 C) (Oral)   Resp 17   Ht 5\' 10"  (1.778 m)   Wt 60.6 kg   SpO2 99%   BMI 19.18 kg/m  Constitutional: No distress . Vital signs reviewed. HENT: Normocephalic.  Atraumatic. Eyes: EOMI. No discharge. Cardiovascular: No JVD. Respiratory: Normal effort.  No stridor. GI: Non-distended. Had BM in bed.  Skin: Warm and dry.  Intact. Psych: Normal mood.  Normal behavior. Musc: No edema in extremities.  No tenderness in extremities. Neuro: Alert Motor: Left upper extremity: Shoulder abduction 4/5, distally 4/5, with apraxia and ataxia, improving Left lower extremity: Hip flexion 4/5, knee extension 4 +/5, ankle dorsiflexion 4+/5, improving Dysarthria, stable Left facial weakness, stable.   Assessment/Plan: 1. Functional deficits secondary to multiple anterior and posterior right brain infarcts which require 3+ hours per day of interdisciplinary therapy in a comprehensive inpatient rehab setting.  Physiatrist is providing close team supervision and 24 hour management of active medical problems listed below.  Physiatrist and rehab team continue to assess barriers to discharge/monitor patient progress toward functional and medical goals  Care Tool:  Bathing    Body parts bathed by patient: Left arm, Chest, Abdomen, Front perineal  area, Right upper leg, Left upper leg, Face, Right arm, Buttocks   Body parts bathed by helper: Front perineal area, Buttocks     Bathing assist Assist Level: Minimal Assistance - Patient > 75%     Upper Body Dressing/Undressing Upper body dressing   What is the patient wearing?: Pull over shirt    Upper body assist Assist Level: Supervision/Verbal cueing    Lower Body Dressing/Undressing Lower body dressing      What is the patient wearing?: Pants, Incontinence brief     Lower body assist Assist for lower body dressing: Moderate Assistance - Patient 50 - 74%     Toileting Toileting Toileting Activity did not occur Landscape architect and hygiene only): N/A (no void or bm)  Toileting assist Assist for toileting: 2 Helpers     Transfers Chair/bed transfer  Transfers assist     Chair/bed transfer assist level: Minimal Assistance - Patient > 75%     Locomotion Ambulation   Ambulation assist   Ambulation activity did not occur: Safety/medical concerns(L hemi, decreased postural control/balance, fatigue)  Assist level: 2 helpers Assistive device: Other (comment)(rail on R) Max distance: 82ft   Walk 10 feet activity   Assist  Walk 10 feet activity did not occur: Safety/medical concerns(L hemi, decreased postural control/balance, fatigue)  Assist level: 2 helpers Assistive device: Other (comment)(rail on R)   Walk 50 feet activity   Assist Walk 50 feet with 2 turns activity did not occur: Safety/medical concerns(L hemi, decreased postural control/balance, fatigue)         Walk 150 feet activity  Assist Walk 150 feet activity did not occur: Safety/medical concerns(L hemi, decreased postural control/balance, fatigue)         Walk 10 feet on uneven surface  activity   Assist Walk 10 feet on uneven surfaces activity did not occur: Safety/medical concerns(L hemi, decreased postural control/balance, fatigue)          Wheelchair     Assist Will patient use wheelchair at discharge?: Yes Type of Wheelchair: Manual    Wheelchair assist level: Supervision/Verbal cueing Max wheelchair distance: 89ft    Wheelchair 50 feet with 2 turns activity    Assist        Assist Level: Supervision/Verbal cueing   Wheelchair 150 feet activity     Assist     Assist Level: Dependent - Patient 0%      Medical Problem List and Plan: 1.  Left side weakness secondary to multiple scattered anterior and posterior right brain infarcts.  Status post loop recorder  Continue CIR  Repeat head CT personally reviewed, unchanged 2.  Antithrombotics: -DVT/anticoagulation: Lovenox             -antiplatelet therapy: Aspirin 325 mg daily and Plavix 75 mg daily x3 months then aspirin alone 3. Pain Management: Tylenol as needed. Well controlled 4. Mood: Provide emotional support             -antipsychotic agents: N/A 5. Neuropsych: This patient is ?fully capable of making decisions on his own behalf. 6. Skin/Wound Care: Routine skin checks 7. Fluids/Electrolytes/Nutrition: Routine in and outs. 8.  Hypertension.  Lopressor 12.5 mg twice daily, increased to 25 on 5/8.    Started Lisinopril 2.5mg  daily, increased to 5 on 5/19  Labile with elevated diastolic pressures on [id number], will need to monitor to ensure to avoid hypotension  5/21: well controlled  Monitor with increased mobility 9.  Hyperlipidemia. Lipitor 10.  Dysphagia.    Advance to regular diet thins with compensatory strategies  Follow-up speech therapy  Cont to advance diet as tolerated 11.  History of tobacco and alcohol use.  NicoDerm patch.  Provide counseling.    Monitor for withdrawal 12.  Moderate hypoalbuminemia  Supplement initiated on 4/28 13.  WBCs  WBCs 3.7 on 5/17, labs ordered for tomorrow  RA latex elevated, CMV elevated -discussed with ID, may consider serial checks as outpatient.  Cont to monitor 13.  AKI-resolved with  IVF  Echocardiogram reviewed, EF ~60%  IVF DC'd on 5/17  Creatinine 0.84 on 5/17 14.  Sepsis  See #12  Remains afebrile  UA suspicious, urine culture multiple species; repeat UA suspicious again, urine culture insignificant growth  Blood cultures NG; repeat blood cultures no growth  Chest x-ray personally reviewed again, unremarkable for infection  Influenza negative  Covid negative  Empiric Septra d/ced, IV Vanco and cefepime DC'd  Procalcitonin within normal limits on 5/14  ID consulted-recommended CT abdomen/pelvis and chest-unremarkable for infection, + atelectasis.  ID signed off  See #13   LOS: 24 days A FACE TO [place] S[place] Valcarcel 07/31/2019, 9:22 AM

## 2019-07-31 NOTE — Progress Notes (Signed)
Speech Language Pathology Daily Session Note  Patient Details  Name: Russell Russell MRN: [id number] Date of Birth: 1950-07-27  Today's Date: 07/31/2019 SLP Individual Time: 1030-1100 SLP Individual Time Calculation (min): 30 min  Short Term Goals: Week 4: SLP Short Term Goal 1 (Week 4): STG=LTG due to short ELOS  Skilled Therapeutic Interventions: Skilled treatment session focused on family education with the patient and his wife. Both were provided education regarding patient's current swallowing function, diet recommendations, appropriate textures, and compensatory strategies. SLP also provided education about proper use and frequency for the patient's EMST device and how to utilize speech compensatory strategies to maximize overall intelligibility. Education was also provided regarding memory compensatory strategies and how to maximize overall function and independence at home. Both verbalized understanding of all information and handouts were given to reinforce information. Patient left upright in the wheelchair with alarm on, wife present and all needs within reach. Continue with current plan of care.      Pain No/Denies Pain   Therapy/Group: Individual Therapy  Osman Calzadilla 07/31/2019, 2:39 PM

## 2019-07-31 NOTE — Progress Notes (Signed)
Physical Therapy Session Note  Patient Details  Name: Russell Russell MRN: [id number] Date of Birth: 09/24/1950  Today's Date: 07/31/2019 PT Individual Time: [id number] PT Individual Time Calculation (min): 38 min   Short Term Goals: Week 3:  PT Short Term Goal 1 (Week 3): pt will transfer sit<>stand CGA PT Short Term Goal 1 - Progress (Week 3): Progressing toward goal PT Short Term Goal 2 (Week 3): Pt will ambulate 45ft with LRAD max A of 1 PT Short Term Goal 2 - Progress (Week 3): Progressing toward goal PT Short Term Goal 3 (Week 3): Pt will perform WC mobility 61ft PT Short Term Goal 3 - Progress (Week 3): Progressing toward goal Week 4:  PT Short Term Goal 1 (Week 4): STG=LTG due to LOS  Skilled Therapeutic Interventions/Progress Updates:   Received pt sitting in WC, pt agreeable to therapy, and denied any pain during session. Pt's wife present for family education training. Session focused on discharge planning, functional mobility/transfers, LE strength, dynamic standing balance/coordinaiton, gait training, and improved activity tolerance. Pt transported to ortho gym in [facility] total assist for time management purposes. Pt performed simulated car transfer x 2 trials with min A. Trial 1 with therapist and trial 2 with pt's wife. Pt required verbal cues for transfer technique, hand placement, and safety. Pt's wife educated on body mechanics/positioning and educated on importance of trying to stay on pt's L side. Pt transported to therapy gym in [facility] total assist. Practiced bumping pt up curb in WC x 2 trials as wife reports they have 1 STE. Trial 1 with therapist and trial 2 with pt's wife. Pt's wife educated on technique, body mechanics/positioning, and safety with activity. Therapist educated pt's wife on [facility] equipment management including donning/doffing legrests/armrest and pt's wife verbalized and demonstrated confidence with task. Pt transferred sit<>stand min A with L UE supported around  therapist and ambulated 24ft with rail on R side min A +2 for WC follow using mirror for visual feedback. Pt required manual facilitation to block L knee buckling and advance L LE. Pt also required verbal cues for weight shifting to R and to transfer body weight over R LE to advance L LE as well as upright posture and head control. Educated pt/pt's wife on importance of avoiding ambulating at home and with therapy only for safety purposes; both in agreement. Pt transported back to room in [facility] total assist. Pt and pt's wife verbalized and demonstrated confidence with all tasks for D/C home. Concluded session with pt sitting in WC, needs within reach, and seatbelt alarm on. Pt's wife present at bedside. Therapist provided pt with ensure.   Therapy Documentation Precautions:  Precautions Precautions: Fall Precaution Comments: permissive HTN initially (<220/120) then normotension, L sided weakness, aspiration precautions (dysphagia 2 with thins), ; L hemiparesis Restrictions Weight Bearing Restrictions: No  Therapy/Group: Individual Therapy Alfonse Alpers PT, DPT   07/31/2019, 7:30 AM

## 2019-07-31 NOTE — Progress Notes (Signed)
Occupational Therapy Session Note  Patient Details  Name: Russell Russell MRN: [id number] Date of Birth: 12/30/1950  Today's Date: 07/31/2019 OT Individual Time: 4098-1191 OT Individual Time Calculation (min): 45 min    Short Term Goals: Week 1:  OT Short Term Goal 1 (Week 1): Pt will transfer to drop arm BSC with mod A +2 with LRAD/ method OT Short Term Goal 1 - Progress (Week 1): Met OT Short Term Goal 2 (Week 1): Pt will perform sit to stand with max cues with mod A in prep for clothing management OT Short Term Goal 2 - Progress (Week 1): Met OT Short Term Goal 3 (Week 1): Pt will be able to maintain static sitting balance in prep for ADL tasks with consistant mod A OT Short Term Goal 3 - Progress (Week 1): Met OT Short Term Goal 4 (Week 1): Pt will incorporate left UE in functional tasks with minimal cuing OT Short Term Goal 4 - Progress (Week 1): Met  Skilled Therapeutic Interventions/Progress Updates:    1:1. Pt and wife wanda present for tx/family ed. OT educates on BSC/TTB for DME, squat pivot transfers, body mechanics, providing A at hips. Static/dynamic balance, clothing management, apraxia, L inattention and pushers syndrome. OT demo all transfers and standing and wife practices blocked BSC/TTB transfers and sit to stand at sink while pt maanges clothing. Ot edu re use of counting for funcitonal communication of transfers/mobility. Wife requires cueing to cue for midline orientation and hand placement but able to provide min-CGA for all transfers. Exited sesison with pt setaed in w/c awaiting PT arrival with wife present  Therapy Documentation Precautions:  Precautions Precautions: Fall Precaution Comments: permissive HTN initially (<220/120) then normotension, L sided weakness, aspiration precautions (dysphagia 2 with thins), ; L hemiparesis Restrictions Weight Bearing Restrictions: No General:   Vital Signs: Therapy Vitals Temp: 98.2 F (36.8 C) Temp Source:  Oral Pulse Rate: 86 Resp: 17 BP: (!) 128/91 Patient Position (if appropriate): Lying Oxygen Therapy SpO2: 99 % O2 Device: Room Air Pain:   ADL: ADL Upper Body Bathing: Minimal assistance Where Assessed-Upper Body Bathing: Sitting at sink Lower Body Bathing: Maximal assistance Where Assessed-Lower Body Bathing: Sitting at sink Upper Body Dressing: Maximal assistance Where Assessed-Upper Body Dressing: Sitting at sink Lower Body Dressing: Maximal assistance Where Assessed-Lower Body Dressing: Sitting at sink, Standing at sink Toileting: Not assessed Toilet Transfer: Not assessed Vision   Perception    Praxis   Exercises:   Other Treatments:     Therapy/Group: Individual Therapy  Tonny Branch 07/31/2019, 9:45 AM

## 2019-07-31 NOTE — Progress Notes (Signed)
Recreational Therapy Session Note  Patient Details  Name: Russell Russell MRN: [id number] Date of Birth: 05/04/1950 Today's Date: 07/31/2019  Pain: no c/o Skilled Therapeutic Interventions/Progress Updates: Met with pt and wife to discuss upcoming discharge and use of leisure time once home.  Reviewed the importance of staying active as pt shares "I'm gonna sleep. And when I'm up, I'm gonna sleep too."  Reviewed activity modifications, importance of healthy lifestyle changes (pt & wife stated he smoked and drank PTA).  Both stated understanding and appreciation of information and feels prepared for upcoming discharge.  Therapy/Group: Individual Therapy  SIMPSON,LISA 07/31/2019, 12:15 PM  

## 2019-08-01 ENCOUNTER — Inpatient Hospital Stay (HOSPITAL_COMMUNITY): Payer: BLUE CROSS/BLUE SHIELD | Admitting: Speech Pathology

## 2019-08-01 ENCOUNTER — Ambulatory Visit (HOSPITAL_COMMUNITY): Payer: BLUE CROSS/BLUE SHIELD | Admitting: Physical Therapy

## 2019-08-01 ENCOUNTER — Encounter (HOSPITAL_COMMUNITY): Payer: BLUE CROSS/BLUE SHIELD

## 2019-08-01 LAB — GLUCOSE, CAPILLARY: Glucose-Capillary: 159 mg/dL — ABNORMAL HIGH (ref 70–99)

## 2019-08-01 NOTE — Progress Notes (Signed)
Physical Therapy Session Note  Patient Details  Name: Russell Russell MRN: [id number] Date of Birth: 1950-08-07  Today's Date: 08/01/2019 PT Individual Time: 0908-1003 PT Individual Time Calculation (min): 55 min   Short Term Goals: Week 4:  PT Short Term Goal 1 (Week 4): STG=LTG due to LOS  Skilled Therapeutic Interventions/Progress Updates:   Pt received supine in bed and agreeable to PT. Pt's wife present for family education.   Wife assisted pt to dress upper and lower body at bed level. Supine>sit transfer with supervision assist and cues for sequencing. Pt performed squat pivot transfers with wife to and from [facility] x 8 throughout treatment with min assist for safety as well as moderate cues from PT for set up and proper facilitation. Pt transported to rehab gym. Gait training with RW x 70f with min assist overall and moderate cues for sequencing, AD management, posture, step width, and midline. Increasing cues for safe turn to chair on the L. Transfer training to bed in apartment with min assist and moderate cues for proper set up. Pt and wife instructed in car transfer x 2 with mod assist overall and max cues for proper set up and importance of listening to wife for safety as pt remained impulsive throughout first attempt to transfer into car. WC mobility training with min assist and hemi technique x 1052f Patient returned to [facility] and [facility] reach and all needs met.         Therapy Documentation Precautions:  Precautions Precautions: Fall Precaution Comments: permissive HTN initially (<220/120) then normotension, L sided weakness, aspiration precautions (dysphagia 2 with thins), ; L hemiparesis Restrictions Weight Bearing Restrictions: No Pain: denies   Therapy/Group: Individual Therapy  AuLorie Phenix/22/2021, 10:04 AM

## 2019-08-01 NOTE — Progress Notes (Signed)
Occupational Therapy Session Note  Patient Details  Name: Russell Russell MRN: [id number] Date of Birth: 1950/10/20  Today's Date: 08/01/2019 OT Individual Time: 1000-1100 OT Individual Time Calculation (min): 60 min    Short Term Goals: Week 4:  OT Short Term Goal 1 (Week 4): STG = LTGs due to remaining LOS  Skilled Therapeutic Interventions/Progress Updates:    Family edu for BADL and continued transfer training. Wife, wanda, present and completes all transfer with CGA-MIN A (per her report) and provides all cuing. OT coached wanda on increasing time inbetween cues, adding in tactile cues, giving pt direction (lean closer/away from me v R/L). [name] with improved carry over throughout session. Pt completes bathing/dressing at sink level with Pt demo increased frustration wanting wanda to do more A than necessary> OT continues to educate pt on improving personal independence at home and decreasing reliance on wanda for physical assist. Pt verbalized understanding. Pt completes couch, TTB and bed transfer with wanda. Last transfer wanda able to set up next to bed, manage w/c parts and cue pt without A from OT in/out of bed. Exited session with pt seated in w/c, exit alarm on and call light I nreach  Therapy Documentation Precautions:  Precautions Precautions: Fall Precaution Comments: permissive HTN initially (<220/120) then normotension, L sided weakness, aspiration precautions (dysphagia 2 with thins), ; L hemiparesis Restrictions Weight Bearing Restrictions: No General:   Vital Signs:   Pain:   ADL: ADL Upper Body Bathing: Minimal assistance Where Assessed-Upper Body Bathing: Sitting at sink Lower Body Bathing: Maximal assistance Where Assessed-Lower Body Bathing: Sitting at sink Upper Body Dressing: Maximal assistance Where Assessed-Upper Body Dressing: Sitting at sink Lower Body Dressing: Maximal assistance Where Assessed-Lower Body Dressing: Sitting at sink, Standing at  sink Toileting: Not assessed Toilet Transfer: Not assessed Vision   Perception    Praxis   Exercises:   Other Treatments:     Therapy/Group: Individual Therapy  Tonny Branch 08/01/2019, 11:01 AM

## 2019-08-01 NOTE — Progress Notes (Signed)
Speech Language Pathology Daily Session Note  Patient Details  Name: Russell Russell MRN: [id number] Date of Birth: [birth year]  Today's Date: 08/01/2019 SLP Individual Time: [id number] SLP Individual Time Calculation (min): 55 min  Short Term Goals: Week 4: SLP Short Term Goal 1 (Week 4): STG=LTG due to short ELOS  Skilled Therapeutic Interventions: Skilled treatment session focused on dysphagia and cognitive goals. Upon arrival, patient was asleep and slow to awaken. SLP facilitated session by providing set-up assist with breakfast meal of regular textures with thin liquids. Patient consumed meal without overt s/s of aspiration but required supervision verbal cues for use of swallowing compensatory strategies. SLP also facilitated session by providing Mod A verbal cues for complex problem solving and Min A verbal cues for recall during a BID pill organization task. Patient left upright in bed with alarm on and all needs within reach. Continue with current plan of care.      Pain No/Denies Pain   Therapy/Group: Individual Therapy  Britnee Mcdevitt 08/01/2019, 8:37 AM

## 2019-08-01 NOTE — Progress Notes (Signed)
[place] PHYSICAL MEDICINE & REHABILITATION PROGRESS NOTE  Subjective/Complaints:   Pt reports no issues- no pain- bowels OK- LBM yesterday.    ROS:  Pt denies SOB, abd pain, CP, N/V/C/D, and vision changes   Objective: Vital Signs: Blood pressure 132/88, pulse 76, temperature 98 F (36.7 C), resp. rate 17, height 5\' 10"  (1.778 m), weight 60.6 kg, SpO2 99 %. No results found. Recent Labs    07/31/19 0654  WBC 4.7  HGB 14.6  HCT 42.5  PLT 375   No results for input(s): NA, K, CL, CO2, GLUCOSE, BUN, CREATININE, CALCIUM in the last 72 hours.  Physical Exam: BP 132/88 (BP Location: Right Arm)   Pulse 76   Temp 98 F (36.7 C)   Resp 17   Ht 5\' 10"  (1.778 m)   Wt 60.6 kg   SpO2 99%   BMI 19.18 kg/m  Constitutional: laying in bed; appears comfortable, RN in room, NAD HENT: Normocephalic.  Atraumatic. Eyes: EOMI. No discharge. Cardiovascular: No JVD. Respiratory: no accessory muscle use-  GI: Non-distended. soft  Skin: Warm and dry.  Intact. Psych: Normal mood.  Normal behavior. Musc: No edema in extremities.  No tenderness in extremities. Neuro: Alert Motor: Left upper extremity: Shoulder abduction 4/5, distally 4/5, with apraxia and ataxia, improving Left lower extremity: Hip flexion 4/5, knee extension 4 +/5, ankle dorsiflexion 4+/5, improving Dysarthria, stable Left facial weakness, stable.   Assessment/Plan: 1. Functional deficits secondary to multiple anterior and posterior right brain infarcts which require 3+ hours per day of interdisciplinary therapy in a comprehensive inpatient rehab setting.  Physiatrist is providing close team supervision and 24 hour management of active medical problems listed below.  Physiatrist and rehab team continue to assess barriers to discharge/monitor patient progress toward functional and medical goals  Care Tool:  Bathing    Body parts bathed by patient: Left arm, Chest, Abdomen, Front perineal area, Right upper leg,  Left upper leg, Face, Right arm, Buttocks   Body parts bathed by helper: Front perineal area, Buttocks     Bathing assist Assist Level: Minimal Assistance - Patient > 75%     Upper Body Dressing/Undressing Upper body dressing   What is the patient wearing?: Pull over shirt    Upper body assist Assist Level: Supervision/Verbal cueing    Lower Body Dressing/Undressing Lower body dressing      What is the patient wearing?: Pants, Incontinence brief     Lower body assist Assist for lower body dressing: Moderate Assistance - Patient 50 - 74%     Toileting Toileting Toileting Activity did not occur Landscape architect and hygiene only): N/A (no void or bm)  Toileting assist Assist for toileting: 2 Helpers     Transfers Chair/bed transfer  Transfers assist     Chair/bed transfer assist level: Minimal Assistance - Patient > 75%     Locomotion Ambulation   Ambulation assist   Ambulation activity did not occur: Safety/medical concerns(L hemi, decreased postural control/balance, fatigue)  Assist level: 2 helpers Assistive device: Other (comment)(rail on R) Max distance: 4ft   Walk 10 feet activity   Assist  Walk 10 feet activity did not occur: Safety/medical concerns(L hemi, decreased postural control/balance, fatigue)  Assist level: 2 helpers Assistive device: Other (comment)(rail on R)   Walk 50 feet activity   Assist Walk 50 feet with 2 turns activity did not occur: Safety/medical concerns(L hemi, decreased postural control/balance, fatigue)         Walk 150 feet activity  Assist Walk 150 feet activity did not occur: Safety/medical concerns(L hemi, decreased postural control/balance, fatigue)         Walk 10 feet on uneven surface  activity   Assist Walk 10 feet on uneven surfaces activity did not occur: Safety/medical concerns(L hemi, decreased postural control/balance, fatigue)         Wheelchair     Assist Will patient use  wheelchair at discharge?: Yes Type of Wheelchair: Manual    Wheelchair assist level: Supervision/Verbal cueing Max wheelchair distance: 57ft    Wheelchair 50 feet with 2 turns activity    Assist        Assist Level: Supervision/Verbal cueing   Wheelchair 150 feet activity     Assist     Assist Level: Dependent - Patient 0%      Medical Problem List and Plan: 1.  Left side weakness secondary to multiple scattered anterior and posterior right brain infarcts.  Status post loop recorder  Continue CIR  Repeat head CT personally reviewed, unchanged 2.  Antithrombotics: -DVT/anticoagulation: Lovenox             -antiplatelet therapy: Aspirin 325 mg daily and Plavix 75 mg daily x3 months then aspirin alone 3. Pain Management: Tylenol as needed. Well controlled 4. Mood: Provide emotional support             -antipsychotic agents: N/A 5. Neuropsych: This patient is ?fully capable of making decisions on his own behalf. 6. Skin/Wound Care: Routine skin checks 7. Fluids/Electrolytes/Nutrition: Routine in and outs. 8.  Hypertension.  Lopressor 12.5 mg twice daily, increased to 25 on 5/8.    Started Lisinopril 2.5mg  daily, increased to 5 on 5/19  Labile with elevated diastolic pressures on [id number], will need to monitor to ensure to avoid hypotension  5/21: well controlled  5/22- Bps well controlled 130s/80s  Monitor with increased mobility 9.  Hyperlipidemia. Lipitor 10.  Dysphagia.    Advance to regular diet thins with compensatory strategies  5/22- tolerating diet 11.  History of tobacco and alcohol use.  NicoDerm patch.  Provide counseling.    Monitor for withdrawal 12.  Moderate hypoalbuminemia  Supplement initiated on 4/28 13.  WBCs  WBCs 3.7 on 5/17, labs ordered for tomorrow  RA latex elevated, CMV elevated -discussed with ID, may consider serial checks as outpatient.  Cont to monitor 13.  AKI-resolved with IVF  Echocardiogram reviewed, EF ~60%  IVF DC'd on  5/17  Creatinine 0.84 on 5/17 14.  Sepsis  See #12  Remains afebrile  UA suspicious, urine culture multiple species; repeat UA suspicious again, urine culture insignificant growth  Blood cultures NG; repeat blood cultures no growth  Chest x-ray personally reviewed again, unremarkable for infection  Influenza negative  Covid negative  Empiric Septra d/ced, IV Vanco and cefepime DC'd  Procalcitonin within normal limits on 5/14  ID consulted-recommended CT abdomen/pelvis and chest-unremarkable for infection, + atelectasis.  ID signed off  See #13   LOS: 25 days A FACE TO FACE EVALUATION WAS PERFORMED  Russell Russell 08/01/2019, 2:08 PM

## 2019-08-02 ENCOUNTER — Inpatient Hospital Stay (HOSPITAL_COMMUNITY): Payer: BLUE CROSS/BLUE SHIELD | Admitting: Speech Pathology

## 2019-08-02 NOTE — Progress Notes (Signed)
Speech Language Pathology Daily Session Note  Patient Details  Name: Harvard Quattrochi MRN: [id number] Date of Birth: [birth year]  Today's Date: 08/02/2019 SLP Individual Time: 1015-1056 SLP Individual Time Calculation (min): 41 min  Short Term Goals: Week 4: SLP Short Term Goal 1 (Week 4): STG=LTG due to short ELOS  Skilled Therapeutic Interventions: Pt was seen for skilled ST targeting cognitive goals. Pt required Min A to use "chunking" compensatory memory strategy to recall general functions of his current medications. He also verbally recalled new instructions for use of his EMST device (to do sets of 5 with rest breaks until reaching 25 exercises total) with Supervision A. However, increased cueing was required for him to execute this accurately. Pt also completed a mildly complex functional money scenario when provided extra time and only Min A verbal cues for verbal problem solving, and recall within task. Pt left laying in bed with alarm set and needs within reach. Continue per current plan of care.         Pain Pain Assessment Pain Scale: 0-10 Pain Score: 0-No pain  Therapy/Group: Individual Therapy  Arbutus Leas 08/02/2019, 7:21 AM

## 2019-08-02 NOTE — Plan of Care (Signed)
  Problem: RH Expression Communication Goal: LTG Patient will increase speech intelligibility (SLP) Description: LTG: Patient will increase speech intelligibility at word/phrase/conversation level with cues, % of the time (SLP) Flowsheets (Taken 08/02/2019 1050) LTG: Patient will increase speech intelligibility (SLP): Minimal Assistance - Patient > 75%   Problem: RH Problem Solving Goal: LTG Patient will demonstrate problem solving for (SLP) Description: LTG:  Patient will demonstrate problem solving for basic/complex daily situations with cues  (SLP) Flowsheets (Taken 08/02/2019 1050) LTG Patient will demonstrate problem solving for: Minimal Assistance - Patient > 75%   Problem: RH Memory Goal: LTG Patient will demonstrate ability for day to day (SLP) Description: LTG:   Patient will demonstrate ability for day to day recall/carryover during cognitive/linguistic activities with assist  (SLP) Flowsheets (Taken 08/02/2019 1050) LTG: Patient will demonstrate ability for day to day recall/carryover during cognitive/linguistic activities with assist (SLP): Minimal Assistance - Patient > 75%   Problem: RH Awareness Goal: LTG: Patient will demonstrate awareness during functional activites type of (SLP) Description: LTG: Patient will demonstrate awareness during functional activites type of (SLP) Flowsheets (Taken 08/02/2019 1050) LTG: Patient will demonstrate awareness during cognitive/linguistic activities with assistance of (SLP): Minimal Assistance - Patient > 75%   Goals downgraded due to slower than anticipated progress

## 2019-08-02 NOTE — Progress Notes (Signed)
[place] PHYSICAL MEDICINE & REHABILITATION PROGRESS NOTE  Subjective/Complaints:   Pt reports had a BM yesterday- denies any issues   ROS:   Pt denies SOB, abd pain, CP, N/V/C/D, and vision changes   Objective: Vital Signs: Blood pressure (!) 127/96, pulse 84, temperature 97.7 F (36.5 C), resp. rate 18, height 5\' 10"  (1.778 m), weight 60.6 kg, SpO2 99 %. No results found. Recent Labs    07/31/19 0654  WBC 4.7  HGB 14.6  HCT 42.5  PLT 375   No results for input(s): NA, K, CL, CO2, GLUCOSE, BUN, CREATININE, CALCIUM in the last 72 hours.  Physical Exam: BP (!) 127/96   Pulse 84   Temp 97.7 F (36.5 C)   Resp 18   Ht 5\' 10"  (1.778 m)   Wt 60.6 kg   SpO2 99%   BMI 19.18 kg/m  Constitutional: laying in bed- just finished breakfast, NAD HENT: Normocephalic.  Atraumatic. Eyes: EOMI. No discharge. Cardiovascular: no JVD Respiratory: no resp distress; appears no accessory muscle use GI: nondistended; soft Skin: Warm and dry.  Intact. Psych: appropriate Musc: No edema in extremities.  No tenderness in extremities. Neuro: Alert Motor: Left upper extremity: Shoulder abduction 4/5, distally 4/5, with apraxia and ataxia, improving Left lower extremity: Hip flexion 4/5, knee extension 4 +/5, ankle dorsiflexion 4+/5, improving Dysarthria, stable Left facial weakness, stable.   Assessment/Plan: 1. Functional deficits secondary to multiple anterior and posterior right brain infarcts which require 3+ hours per day of interdisciplinary therapy in a comprehensive inpatient rehab setting.  Physiatrist is providing close team supervision and 24 hour management of active medical problems listed below.  Physiatrist and rehab team continue to assess barriers to discharge/monitor patient progress toward functional and medical goals  Care Tool:  Bathing    Body parts bathed by patient: Left arm, Chest, Abdomen, Front perineal area, Right upper leg, Left upper leg, Face, Right  arm, Buttocks   Body parts bathed by helper: Front perineal area, Buttocks     Bathing assist Assist Level: Minimal Assistance - Patient > 75%     Upper Body Dressing/Undressing Upper body dressing   What is the patient wearing?: Pull over shirt    Upper body assist Assist Level: Supervision/Verbal cueing    Lower Body Dressing/Undressing Lower body dressing      What is the patient wearing?: Pants, Incontinence brief     Lower body assist Assist for lower body dressing: Moderate Assistance - Patient 50 - 74%     Toileting Toileting Toileting Activity did not occur Landscape architect and hygiene only): N/A (no void or bm)  Toileting assist Assist for toileting: 2 Helpers     Transfers Chair/bed transfer  Transfers assist     Chair/bed transfer assist level: Minimal Assistance - Patient > 75%     Locomotion Ambulation   Ambulation assist   Ambulation activity did not occur: Safety/medical concerns(L hemi, decreased postural control/balance, fatigue)  Assist level: 2 helpers Assistive device: Other (comment)(rail on R) Max distance: 35ft   Walk 10 feet activity   Assist  Walk 10 feet activity did not occur: Safety/medical concerns(L hemi, decreased postural control/balance, fatigue)  Assist level: 2 helpers Assistive device: Other (comment)(rail on R)   Walk 50 feet activity   Assist Walk 50 feet with 2 turns activity did not occur: Safety/medical concerns(L hemi, decreased postural control/balance, fatigue)         Walk 150 feet activity   Assist Walk 150 feet activity did not  occur: Safety/medical concerns(L hemi, decreased postural control/balance, fatigue)         Walk 10 feet on uneven surface  activity   Assist Walk 10 feet on uneven surfaces activity did not occur: Safety/medical concerns(L hemi, decreased postural control/balance, fatigue)         Wheelchair     Assist Will patient use wheelchair at discharge?:  Yes Type of Wheelchair: Manual    Wheelchair assist level: Supervision/Verbal cueing Max wheelchair distance: 21ft    Wheelchair 50 feet with 2 turns activity    Assist        Assist Level: Supervision/Verbal cueing   Wheelchair 150 feet activity     Assist     Assist Level: Dependent - Patient 0%      Medical Problem List and Plan: 1.  Left side weakness secondary to multiple scattered anterior and posterior right brain infarcts.  Status post loop recorder  Continue CIR  Repeat head CT personally reviewed, unchanged 2.  Antithrombotics: -DVT/anticoagulation: Lovenox             -antiplatelet therapy: Aspirin 325 mg daily and Plavix 75 mg daily x3 months then aspirin alone 3. Pain Management: Tylenol as needed. Well controlled 4. Mood: Provide emotional support             -antipsychotic agents: N/A 5. Neuropsych: This patient is ?fully capable of making decisions on his own behalf. 6. Skin/Wound Care: Routine skin checks 7. Fluids/Electrolytes/Nutrition: Routine in and outs. 8.  Hypertension.  Lopressor 12.5 mg twice daily, increased to 25 on 5/8.    Started Lisinopril 2.5mg  daily, increased to 5 on 5/19  Labile with elevated diastolic pressures on [id number], will need to5/23- BP's well controlled- con't meds  Monitor with increased mobility 9.  Hyperlipidemia. Lipitor 10.  Dysphagia.    Advance to regular diet thins with compensatory strategies  5/23- no choking with breakfast 11.  History of tobacco and alcohol use.  NicoDerm patch.  Provide counseling.    Monitor for withdrawal 12.  Moderate hypoalbuminemia  Supplement initiated on 4/28 13.  WBCs  WBCs 3.7 on 5/17, labs ordered for tomorrow  RA latex elevated, CMV elevated -discussed with ID, may consider serial checks as outpatient.  Cont to monitor 13.  AKI-resolved with IVF  Echocardiogram reviewed, EF ~60%  IVF DC'd on 5/17  Creatinine 0.84 on 5/17 14.  Sepsis  See #12  Remains afebrile  UA  suspicious, urine culture multiple species; repeat UA suspicious again, urine culture insignificant growth  Blood cultures NG; repeat blood cultures no growth  Chest x-ray personally reviewed again, unremarkable for infection  Influenza negative  Covid negative  Empiric Septra d/ced, IV Vanco and cefepime DC'd  Procalcitonin within normal limits on 5/14  ID consulted-recommended CT abdomen/pelvis and chest-unremarkable for infection, + atelectasis.  ID signed off  See #13   LOS: 26 days A FACE TO FACE EVALUATION WAS PERFORMED  Chloe Baig 08/02/2019, 12:04 PM

## 2019-08-03 ENCOUNTER — Inpatient Hospital Stay (HOSPITAL_COMMUNITY): Payer: BLUE CROSS/BLUE SHIELD

## 2019-08-03 ENCOUNTER — Inpatient Hospital Stay (HOSPITAL_COMMUNITY): Payer: BLUE CROSS/BLUE SHIELD | Admitting: Occupational Therapy

## 2019-08-03 DIAGNOSIS — R7303 Prediabetes: Secondary | ICD-10-CM

## 2019-08-03 MED ORDER — NICOTINE 14 MG/24HR TD PT24
14.0000 mg | MEDICATED_PATCH | Freq: Every day | TRANSDERMAL | Status: DC
  Administered 2019-08-03 – 2019-08-04 (×2): 14 mg via TRANSDERMAL
  Filled 2019-08-03 (×3): qty 1

## 2019-08-03 NOTE — Progress Notes (Signed)
Per patient, LBM was yesterday, stated that he had 2 of them. Per chart, LBM was 5/21. Pt has PRN sorbitol and senokot, and refused both of them

## 2019-08-03 NOTE — Progress Notes (Addendum)
[place] PHYSICAL MEDICINE & REHABILITATION PROGRESS NOTE  Subjective/Complaints: Patient seen laying in bed this morning.  He states he slept well overnight.  He states that he would weekend.  He states he is looking forward to discharge on Wednesday.  ROS: Denies CP, SOB, N/V/D  Objective: Vital Signs: Blood pressure (!) 151/99, pulse 76, temperature 97.8 F (36.6 C), temperature source Oral, resp. rate 18, height 5\' 10"  (1.778 m), weight 60.6 kg, SpO2 99 %. No results found. No results for input(s): WBC, HGB, HCT, PLT in the last 72 hours. No results for input(s): NA, K, CL, CO2, GLUCOSE, BUN, CREATININE, CALCIUM in the last 72 hours.  Physical Exam: BP (!) 151/99 (BP Location: Right Arm)   Pulse 76   Temp 97.8 F (36.6 C) (Oral)   Resp 18   Ht 5\' 10"  (1.778 m)   Wt 60.6 kg   SpO2 99%   BMI 19.18 kg/m  Constitutional: No distress . Vital signs reviewed. HENT: Normocephalic.  Atraumatic. Eyes: EOMI. No discharge. Cardiovascular: No JVD. Respiratory: Normal effort.  No stridor. GI: Non-distended. Skin: Warm and dry.  Intact. Psych: Normal mood.  Normal behavior. Musc: No edema in extremities.  No tenderness in extremities. Neuro: Alert Motor: Left upper extremity: Shoulder abduction 4/5, distally 4/5, with apraxia and ataxia, stable Left lower extremity: Hip flexion 4/5, knee extension 4 +/5, ankle dorsiflexion 4+/5, stable Dysarthria, stable Left facial weakness, stable.   Assessment/Plan: 1. Functional deficits secondary to multiple anterior and posterior right brain infarcts which require 3+ hours per day of interdisciplinary therapy in a comprehensive inpatient rehab setting.  Physiatrist is providing close team supervision and 24 hour management of active medical problems listed below.  Physiatrist and rehab team continue to assess barriers to discharge/monitor patient progress toward functional and medical goals  Care Tool:  Bathing    Body parts bathed  by patient: Left arm, Chest, Abdomen, Front perineal area, Right upper leg, Left upper leg, Face, Right arm, Buttocks   Body parts bathed by helper: Front perineal area, Buttocks     Bathing assist Assist Level: Minimal Assistance - Patient > 75%     Upper Body Dressing/Undressing Upper body dressing   What is the patient wearing?: Pull over shirt    Upper body assist Assist Level: Supervision/Verbal cueing    Lower Body Dressing/Undressing Lower body dressing      What is the patient wearing?: Pants, Incontinence brief     Lower body assist Assist for lower body dressing: Moderate Assistance - Patient 50 - 74%     Toileting Toileting Toileting Activity did not occur Landscape architect and hygiene only): N/A (no void or bm)  Toileting assist Assist for toileting: 2 Helpers     Transfers Chair/bed transfer  Transfers assist     Chair/bed transfer assist level: Minimal Assistance - Patient > 75%     Locomotion Ambulation   Ambulation assist   Ambulation activity did not occur: Safety/medical concerns(L hemi, decreased postural control/balance, fatigue)  Assist level: 2 helpers Assistive device: Other (comment)(rail on R) Max distance: 40ft   Walk 10 feet activity   Assist  Walk 10 feet activity did not occur: Safety/medical concerns(L hemi, decreased postural control/balance, fatigue)  Assist level: 2 helpers Assistive device: Other (comment)(rail on R)   Walk 50 feet activity   Assist Walk 50 feet with 2 turns activity did not occur: Safety/medical concerns(L hemi, decreased postural control/balance, fatigue)         Walk 150 feet  activity   Assist Walk 150 feet activity did not occur: Safety/medical concerns(L hemi, decreased postural control/balance, fatigue)         Walk 10 feet on uneven surface  activity   Assist Walk 10 feet on uneven surfaces activity did not occur: Safety/medical concerns(L hemi, decreased postural  control/balance, fatigue)         Wheelchair     Assist Will patient use wheelchair at discharge?: Yes Type of Wheelchair: Manual    Wheelchair assist level: Supervision/Verbal cueing Max wheelchair distance: 56ft    Wheelchair 50 feet with 2 turns activity    Assist        Assist Level: Supervision/Verbal cueing   Wheelchair 150 feet activity     Assist     Assist Level: Dependent - Patient 0%      Medical Problem List and Plan: 1.  Left side weakness secondary to multiple scattered anterior and posterior right brain infarcts.  Status post loop recorder  Continue CIR  Repeat head CT personally reviewed, unchanged 2.  Antithrombotics: -DVT/anticoagulation: Lovenox  CBC within normal limits on 5/21             -antiplatelet therapy: Aspirin 325 mg daily and Plavix 75 mg daily x3 months then aspirin alone 3. Pain Management: Tylenol as needed. Well controlled 4. Mood: Provide emotional support             -antipsychotic agents: N/A 5. Neuropsych: This patient is ?fully capable of making decisions on his own behalf. 6. Skin/Wound Care: Routine skin checks 7. Fluids/Electrolytes/Nutrition: Routine in and outs.  BMP ordered for tomorrow  8.  Hypertension.  Lopressor 12.5 mg twice daily, increased to 25 on 5/8.    Started Lisinopril 2.5mg  daily, increased to 5 on 0000000  Diastolic blood pressure elevated on 5/24, but overall improving  Monitor with increased mobility 9.  Hyperlipidemia. Lipitor 10. Post stroke dysphagia.    Advance to regular diet thins with compensatory strategies 11.  History of tobacco and alcohol use.  NicoDerm patch.  Provide counseling.    Monitor for withdrawal 12.  Moderate hypoalbuminemia  Supplement initiated on 4/28 13.  WBCs:   WBCs within normal limits on 5/21  RA latex elevated, CMV elevated -discussed with ID, may consider serial checks as outpatient.  Cont to monitor 13.  AKI-resolved with IVF  Echocardiogram reviewed,  EF ~60%  IVF DC'd on 5/17  Creatinine 0.84 on 5/17 14.  Sepsis: Resolved  See #12  Remains afebrile  UA suspicious, urine culture multiple species; repeat UA suspicious again, urine culture insignificant growth  Blood cultures NG; repeat blood cultures no growth  Chest x-ray personally reviewed again, unremarkable for infection  Influenza negative  Covid negative  Empiric Septra d/ced, IV Vanco and cefepime DC'd  Procalcitonin within normal limits on 5/14  ID consulted-recommended CT abdomen/pelvis and chest-unremarkable for infection, + atelectasis.  ID signed off 15.  Prediabetes  Changed carb modified  CBG elevated on 5/22   LOS: 27 days A FACE TO FACE EVALUATION WAS PERFORMED  Adyline Huberty Lorie Phenix 08/03/2019, 8:48 AM

## 2019-08-03 NOTE — Plan of Care (Signed)
  Problem: Consults Goal: RH STROKE PATIENT EDUCATION Description: See Patient Education module for education specifics  Outcome: Progressing Goal: Nutrition Consult-if indicated Outcome: Progressing   Problem: RH SKIN INTEGRITY Goal: RH STG SKIN FREE OF INFECTION/BREAKDOWN Description: Skin free of breakdown and infection with Min assist Outcome: Progressing Goal: RH STG MAINTAIN SKIN INTEGRITY WITH ASSISTANCE Description: STG Maintain Skin Integrity With min Assistance.  Outcome: Progressing   Problem: RH SAFETY Goal: RH STG ADHERE TO SAFETY PRECAUTIONS W/ASSISTANCE/DEVICE Description: STG Adhere to Safety Precautions With min Assistance/Device.  Outcome: Progressing Goal: RH STG DECREASED RISK OF FALL WITH ASSISTANCE Description: STG Decreased Risk of Fall With min Assistance.  Outcome: Progressing   Problem: RH KNOWLEDGE DEFICIT Goal: RH STG INCREASE KNOWLEDGE OF HYPERTENSION Description: Patient/family will be able to describe management of hypertension including medication, diet, and lifestyle interventions with cues/handouts Outcome: Progressing Goal: RH STG INCREASE KNOWLEDGE OF DYSPHAGIA/FLUID INTAKE Description: Patient/Family will be able to describe and demonstrate safe swallowing techniques and identify foods in D2 diet scope with cues/handouts Outcome: Progressing Goal: RH STG INCREASE KNOWLEGDE OF HYPERLIPIDEMIA Description: Patient/family will be able to describe management of hyperlipidemia including medication, diet, and lifestyle interventions with cues/handouts Outcome: Progressing Goal: RH STG INCREASE KNOWLEDGE OF STROKE PROPHYLAXIS Description: Patient/family will be able to describe stroke prophylaxis including medication, diet, and lifestyle interventions with cues/handouts Outcome: Progressing

## 2019-08-03 NOTE — Discharge Summary (Addendum)
Physician Discharge Summary  Patient ID: Russell Russell MRN: [id number] DOB/AGE: [birth year] 69 y.o.  Admit date: 07/07/2019 Discharge date:   Discharge Diagnoses:  Principal Problem:   Cerebrovascular accident (CVA) of right basal ganglia ([place]) Active Problems:   Essential hypertension   Tobacco abuse   Alcohol abuse   Leukopenia   Benign essential HTN   Hypoalbuminemia   Labile blood pressure   Blood pressure increase diastolic   Fever   Sepsis without acute organ dysfunction (HCC)   Elevated BUN   AKI (acute kidney injury) ([place])   Discharged Condition: Stable  Significant Diagnostic Studies: DG Chest 2 View  Result Date: 07/23/2019 CLINICAL DATA:  Fever. EXAM: CHEST - 2 VIEW COMPARISON:  Jul 20, 2019 FINDINGS: There is no evidence of acute infiltrate, pleural effusion or pneumothorax. The heart size and mediastinal contours are within normal limits. A loop recorder device is again noted. The visualized skeletal structures are unremarkable. IMPRESSION: No active cardiopulmonary disease. Electronically Signed   By: Virgina Norfolk M.D.   On: 07/23/2019 00:00   DG Chest 2 View  Result Date: 07/20/2019 CLINICAL DATA:  Fevers EXAM: CHEST - 2 VIEW COMPARISON:  None. FINDINGS: Cardiac shadows within normal limits. The lungs are well aerated bilaterally. No focal infiltrate or sizable effusion is noted. No bony abnormality is seen. Loop recorder is noted on the left. IMPRESSION: No active cardiopulmonary disease. Electronically Signed   By: Inez Catalina M.D.   On: 07/20/2019 22:31   CT HEAD WO CONTRAST  Result Date: 07/22/2019 CLINICAL DATA:  Fever of unknown origin. Recent infarct. EXAM: CT HEAD WITHOUT CONTRAST TECHNIQUE: Contiguous axial images were obtained from the base of the skull through the vertex without intravenous contrast. COMPARISON:  Brain MRI 07/01/2019 Head CT 06/30/2019 FINDINGS: Brain: There is no mass, hemorrhage or extra-axial collection. The size and  configuration of the ventricles and extra-axial CSF spaces are normal. There is hypoattenuation of the white matter, most commonly indicating chronic small vessel disease. Unchanged appearance of multiple recent infarcts of the right basal ganglia and centrum semiovale. Vascular: No abnormal hyperdensity of the major intracranial arteries or dural venous sinuses. No intracranial atherosclerosis. Skull: The visualized skull base, calvarium and extracranial soft tissues are normal. Sinuses/Orbits: No fluid levels or advanced mucosal thickening of the visualized paranasal sinuses. No mastoid or middle ear effusion. The orbits are normal. IMPRESSION: Unchanged appearance of multiple recent infarcts of the right basal ganglia and centrum semiovale. No hemorrhage or mass effect. Electronically Signed   By: Ulyses Jarred M.D.   On: 07/22/2019 20:06   CT CHEST W CONTRAST  Result Date: 07/25/2019 CLINICAL DATA:  Fever of unknown origin. EXAM: CT CHEST WITH CONTRAST TECHNIQUE: Multidetector CT imaging of the chest was performed during intravenous contrast administration. CONTRAST:  57mL OMNIPAQUE IOHEXOL 300 MG/ML  SOLN COMPARISON:  None. FINDINGS: Cardiovascular: Ectasia of the ascending thoracic aorta, measuring 3.9 cm diameter. No aortic dissection. No pericardial effusion. Mild aortic atherosclerosis. Mediastinum/Nodes: No mass or enlarged lymph nodes within the mediastinum or perihilar regions. Esophagus is unremarkable. Lungs/Pleura: Nonobstructing debris within the lower trachea extending to the upper margin of the RIGHT main bronchus. Trachea and central bronchi are otherwise unremarkable. Small bibasilar consolidations, likely atelectasis. Additional mild dependent atelectasis bilaterally. No evidence of pneumonia. No evidence of pulmonary edema. No pleural effusion or pneumothorax. Upper Abdomen: Limited images of the upper abdomen are unremarkable. Musculoskeletal: No acute or suspicious osseous finding.  IMPRESSION: 1. Small bibasilar consolidations, likely atelectasis. No evidence of  pneumonia or pulmonary edema. 2. Nonobstructing debris within the lower trachea extending to the upper margin of the RIGHT main bronchus. Aortic Atherosclerosis (ICD10-I70.0). Electronically Signed   By: Franki Cabot M.D.   On: 07/25/2019 05:27   CT ABDOMEN PELVIS W CONTRAST  Result Date: 07/24/2019 CLINICAL DATA:  Fever of unknown origin EXAM: CT ABDOMEN AND PELVIS WITH CONTRAST TECHNIQUE: Multidetector CT imaging of the abdomen and pelvis was performed using the standard protocol following bolus administration of intravenous contrast. CONTRAST:  135mL OMNIPAQUE IOHEXOL 300 MG/ML  SOLN COMPARISON:  09/13/2014 FINDINGS: Lower chest: Bibasilar atelectasis.  Trace left pleural effusion. Hepatobiliary: No focal hepatic abnormality. Gallbladder unremarkable. Pancreas: No focal abnormality or ductal dilatation. Spleen: No focal abnormality.  Normal size. Adrenals/Urinary Tract: Atrophic left kidney. Small bilateral renal cysts. No hydronephrosis. Adrenal glands and urinary bladder unremarkable. Stomach/Bowel: Normal appendix. Moderate stool throughout the colon. Stomach, large and small bowel grossly unremarkable. Vascular/Lymphatic: Aortic atherosclerosis. No enlarged abdominal or pelvic lymph nodes. Reproductive: Prostate enlargement. Other: No free fluid or free air. Musculoskeletal: No acute bony abnormality. IMPRESSION: Trace left pleural effusion. Atrophic left kidney, stable since prior study. Aortic atherosclerosis. Moderate stool burden throughout the colon. Prostate enlargement. No acute findings. Electronically Signed   By: Rolm Baptise M.D.   On: 07/24/2019 15:34   ECHOCARDIOGRAM COMPLETE  Result Date: 07/24/2019    ECHOCARDIOGRAM REPORT   Patient Name:   Russell Russell Date of Exam: 07/24/2019 Medical Rec #:  [id number]      Height:       70.0 in Accession #:    [id number]     Weight:       128.5 lb Date of Birth:   01-Dec-1950     BSA:          1.730 m Patient Age:    69 years       BP:           128/98 mmHg Patient Gender: M              HR:           85 bpm. Exam Location:  Inpatient Procedure: 2D Echo Indications:    Fever 780.6 / R50.9  History:        Patient has prior history of Echocardiogram examinations, most                 recent 07/01/2019. Stroke; Risk Factors:Hypertension,                 Dyslipidemia and Current Smoker. GERD.  Sonographer:    Darlina Sicilian RDCS Referring Phys: [place]  1. Left ventricular ejection fraction, by estimation, is 60 to 65%. The left ventricle has normal function. The left ventricle has no regional wall motion abnormalities. There is moderate left ventricular hypertrophy of the basal-septal segment. Left ventricular diastolic parameters were normal.  2. Right ventricular systolic function is normal. The right ventricular size is normal. There is mildly elevated pulmonary artery systolic pressure. The estimated right ventricular systolic pressure is Q000111Q mmHg.  3. The mitral valve is normal in structure. Trivial mitral valve regurgitation. No evidence of mitral stenosis.  4. The aortic valve is normal in structure. Aortic valve regurgitation is not visualized. No aortic stenosis is present.  5. The inferior vena cava is dilated in size with <50% respiratory variability, suggesting right atrial pressure of 15 mmHg. Conclusion(s)/Recommendation(s): No evidence of valvular vegetations on this transthoracic echocardiogram. Would recommend a transesophageal  echocardiogram to exclude infective endocarditis if clinically indicated. FINDINGS  Left Ventricle: Left ventricular ejection fraction, by estimation, is 60 to 65%. The left ventricle has normal function. The left ventricle has no regional wall motion abnormalities. The left ventricular internal cavity size was normal in size. There is  moderate left ventricular hypertrophy of the basal-septal segment. Left  ventricular diastolic parameters were normal. Normal left ventricular filling pressure. Right Ventricle: The right ventricular size is normal. No increase in right ventricular wall thickness. Right ventricular systolic function is normal. There is mildly elevated pulmonary artery systolic pressure. The tricuspid regurgitant velocity is 2.27  m/s, and with an assumed right atrial pressure of 15 mmHg, the estimated right ventricular systolic pressure is Q000111Q mmHg. Left Atrium: Left atrial size was normal in size. Right Atrium: Right atrial size was normal in size. Pericardium: There is no evidence of pericardial effusion. Mitral Valve: The mitral valve is normal in structure. Normal mobility of the mitral valve leaflets. Trivial mitral valve regurgitation. No evidence of mitral valve stenosis. Tricuspid Valve: The tricuspid valve is normal in structure. Tricuspid valve regurgitation is trivial. No evidence of tricuspid stenosis. Aortic Valve: The aortic valve is normal in structure. Aortic valve regurgitation is not visualized. No aortic stenosis is present. Pulmonic Valve: The pulmonic valve was normal in structure. Pulmonic valve regurgitation is not visualized. No evidence of pulmonic stenosis. Aorta: The aortic root is normal in size and structure. Venous: The inferior vena cava is dilated in size with less than 50% respiratory variability, suggesting right atrial pressure of 15 mmHg. IAS/Shunts: No atrial level shunt detected by color flow Doppler.  LEFT VENTRICLE PLAX 2D LVIDd:         4.67 cm  Diastology LVIDs:         2.98 cm  LV e' lateral:   10.10 cm/s LV PW:         1.00 cm  LV E/e' lateral: 5.5 LV IVS:        1.46 cm  LV e' medial:    6.74 cm/s LVOT diam:     2.30 cm  LV E/e' medial:  8.3 LV SV:         62 LV SV Index:   36 LVOT Area:     4.15 cm  RIGHT VENTRICLE RV S prime:     15.40 cm/s TAPSE (M-mode): 2.1 cm LEFT ATRIUM           Index       RIGHT ATRIUM           Index LA diam:      3.70 cm 2.14  cm/m  RA Area:     16.70 cm LA Vol (A4C): 41.4 ml 23.94 ml/m RA Volume:   43.40 ml  25.09 ml/m  AORTIC VALVE LVOT Vmax:   65.90 cm/s LVOT Vmean:  52.200 cm/s LVOT VTI:    0.150 m MITRAL VALVE               TRICUSPID VALVE MV Area (PHT): 5.54 cm    TR Peak grad:   20.6 mmHg MV Decel Time: 137 msec    TR Vmax:        227.00 cm/s MV E velocity: 55.70 cm/s MV A velocity: 44.10 cm/s  SHUNTS MV E/A ratio:  1.26        Systemic VTI:  0.15 m  Systemic Diam: 2.30 cm Fransico Him MD Electronically signed by Fransico Him MD Signature Date/Time: 07/24/2019/2:10:53 PM    Final    CUP PACEART REMOTE DEVICE CHECK  Result Date: 08/04/2019 [facility] summary report received. Battery status OK. Normal device function. No new symptom episodes, tachy episodes, brady, or pause episodes. No new AF episodes. Monthly summary reports and ROV/PRN  VAS [place] UPPER EXTREMITY VENOUS DUPLEX  Result Date: 07/26/2019 UPPER VENOUS STUDY  Indications: fever Performing Technologist: June Leap RDMS, RVT  Examination Guidelines: A complete evaluation includes B-mode imaging, spectral Doppler, color Doppler, and power Doppler as needed of all accessible portions of each vessel. Bilateral testing is considered an integral part of a complete examination. Limited examinations for reoccurring indications may be performed as noted.  Right Findings: +----------+------------+---------+-----------+----------+-------+ RIGHT     CompressiblePhasicitySpontaneousPropertiesSummary +----------+------------+---------+-----------+----------+-------+ IJV           Full       Yes       Yes                      +----------+------------+---------+-----------+----------+-------+ Subclavian    Full       Yes       Yes                      +----------+------------+---------+-----------+----------+-------+ Axillary      Full       Yes       Yes                       +----------+------------+---------+-----------+----------+-------+ Brachial      Full                                          +----------+------------+---------+-----------+----------+-------+ Radial        Full                                          +----------+------------+---------+-----------+----------+-------+ Ulnar         Full                                          +----------+------------+---------+-----------+----------+-------+ Cephalic      Full                                          +----------+------------+---------+-----------+----------+-------+ Basilic       Full                                          +----------+------------+---------+-----------+----------+-------+  Left Findings: +----------+------------+---------+-----------+----------+--------------+ LEFT      CompressiblePhasicitySpontaneousProperties   Summary     +----------+------------+---------+-----------+----------+--------------+ IJV           Full       Yes       Yes                             +----------+------------+---------+-----------+----------+--------------+  Subclavian    Full       Yes       Yes                             +----------+------------+---------+-----------+----------+--------------+ Axillary      Full       Yes       Yes                             +----------+------------+---------+-----------+----------+--------------+ Brachial      Full                                                 +----------+------------+---------+-----------+----------+--------------+ Radial        Full                                                 +----------+------------+---------+-----------+----------+--------------+ Ulnar         Full                                                 +----------+------------+---------+-----------+----------+--------------+ Cephalic                                            Not visualized  +----------+------------+---------+-----------+----------+--------------+ Basilic       Full                                                 +----------+------------+---------+-----------+----------+--------------+  Summary: No evidence of deep vein or superficial vein thrombosis involving the right and left upper extremities.  *See table(s) above for measurements and observations.  Diagnosing physician: Ruta Hinds MD Electronically signed by Ruta Hinds MD on 07/26/2019 at 8:55:17 AM.    Final     Labs:  Basic Metabolic Panel: Recent Labs  Lab 08/04/19 0846  NA 140  K 4.3  CL 105  CO2 27  GLUCOSE 110*  BUN 15  CREATININE 1.02  CALCIUM 9.7    CBC: Recent Labs  Lab 07/31/19 0654  WBC 4.7  NEUTROABS 2.5  HGB 14.6  HCT 42.5  MCV 91.6  PLT 375    CBG: Recent Labs  Lab 08/01/19 1720  GLUCAP 159*   Family history.  Mother with colon cancer.  Sister with breast cancer.  Denies any diabetes mellitus esophageal cancer or rectal cancer  Brief HPI:   Russell Russell is a 69 y.o. right-handed male with history of alcohol and tobacco use, hyperlipidemia.  Lives with spouse.  1 level home reportedly independent prior to admission.  Presented 06/30/2018 while left-sided weakness.  Cranial CT scan unremarkable.  Patient did not receive TPA.  CT angiogram head and neck no emergent large vessel occlusion or high-grade stenosis.  MRI showed multiple scattered acute ischemic infarcts  involving the posterior right basal ganglia right cerebral hemisphere and right cerebellum.  Echocardiogram with ejection fraction of 60% no embolus.  Lower extremity Dopplers negative.  Admission chemistries unremarkable except alcohol level of 18, SARS coronavirus negative.  Maintained on aspirin and Plavix x3 months and aspirin alone.  Patient did undergo loop recorder placement.  Subcutaneous Lovenox for DVT prophylaxis.  Dysphagia #2 thin liquid diet.  Patient was admitted for a comprehensive rehab  program.   Hospital Course: Russell Russell was admitted to rehab 07/07/2019 for inpatient therapies to consist of PT, ST and OT at least three hours five days a week. Past admission physiatrist, therapy team and rehab RN have worked together to provide customized collaborative inpatient rehab.  Pertaining to patient's multiple scattered acute anterior posterior right brain infarcts remained stable loop recorder placement.  Aspirin and Plavix x3 months and aspirin alone he would follow-up neurology services.  Subcutaneous Lovenox for DVT prophylaxis no bleeding episodes.  Blood pressures controlled on lisinopril as well as Lopressor no orthostasis follow-up outpatient PCP.  Hyperlipidemia Lipitor as directed.  Patient did have a history of alcohol tobacco use NicoDerm patch as well as provided counsel regards to cessation of alcohol illicit drug use and tobacco.   Blood pressures were monitored on TID basis and controlled   He/ has made gains during rehab stay and is attending therapies  He/ will continue to receive follow up therapies   after discharge  Rehab course: During patient's stay in rehab weekly team conferences were held to monitor patient's progress, set goals and discuss barriers to discharge. At admission, patient required max assist supine to sit.  Max assist sit to supine.  Mod max assist stand pivot transfers.  Max assist ADLs.  Physical exam.  Blood pressure 115/91 pulse 93 temperature 98 respiration 20 oxygen saturation 98% room air Constitutional alert male no acute distress HEENT Head.  Left facial droop.  Atraumatic Eyes.  Pupils round and reactive to light no discharge without nystagmus Neck.  Supple nontender no JVD without thyromegaly Cardiac regular rate rhythm without any extra sounds or murmur heard Abdomen.  Soft nontender positive bowel sounds without rebound Respiratory effort normal no respiratory distress without wheeze Musculoskeletal normal range of  motion Comments.  Left upper extremity biceps 3+/5, triceps 4/5, wrist extension 4 -/5, grip 4/5 finger abduction 4 -/5 Right upper extremity 5/5 same muscles Left lower extremity hip flexors 1/5, knee extension 2 -/5, knee flexion 1/5, dorsiflexion 2/5, plantarflexion 0/5, Right lower extremity 5/5 same muscles Neurological alert and oriented speech dysarthric but intelligible follows commands  He/  has had improvement in activity tolerance, balance, postural control as well as ability to compensate for deficits. He/ has had improvement in functional use RUE/LUE  and RLE/LLE as well as improvement in awareness.  Working with energy conservation techniques and ongoing family teaching.  Wife assisted patient to dress upper lower body at bed level.  Supine to sit transfers supervision assist.  Perform squat pivot transfers with wife to and from wheelchair with minimal assist.  Ambulates 15 feet rolling walker minimal assist overall.  Transfer training in bed apartment with min assist moderate cues.  Propels his wheelchair minimal assist with hemitechnique.  Completes all transfers with contact-guard minimal assist for ADLs.  Patient was able to communicate his needs.  His diet was advanced to regular consistency.  Full family teaching completed and discharged to home       Disposition: Discharge to home    Diet: Regular  Special Instructions: No driving smoking or alcohol  Continue aspirin and Plavix x3 months then aspirin alone  Medications at discharge. 1.  Tylenol as needed 2.  Aspirin 325 mg daily 3.  Lipitor 40 mg daily 4.  Plavix 75 mg p.o. daily 5.  Folic acid 1 mg daily 6.  Lisinopril 5 mg daily 7.  Lopressor 25 mg p.o. twice daily 8.  Multivitamin daily 9.  NicoDerm patch taper as directed 10.  Protonix 40 mg daily 11.  FiberCon.  1 tab p.o. twice daily  30-35 minutes were spent completing discharge summary and discharge planning  Discharge Instructions     Ambulatory  referral to Neurology   Complete by: As directed    An appointment is requested in approximately 4 weeks anterior posterior right brain infarction   Ambulatory referral to Physical Medicine Rehab   Complete by: As directed    Moderate complexity follow-up 1 to 2 weeks anterior posterior right brain infarction       Follow-up Information     Jamse Arn, MD Follow up.   Specialty: Physical Medicine and Rehabilitation Why: Office to call for appointment Contact information: [address] [place] [address] [phone]         Thompson Grayer, MD Follow up.   Specialty: Cardiology Why: Call for appointment Contact information: [place] [address] [phone]            Signed: Cathlyn Parsons 08/05/2019, 4:38 AM Patient was seen, face-face, and physical exam performed by me on day of discharge, greater than 30 minutes of total time spent.. Please see progress note from day of discharge as well.  Delice Lesch, MD, ABPMR

## 2019-08-03 NOTE — Progress Notes (Signed)
Occupational Therapy Session Note  Patient Details  Name: Russell Russell MRN: [id number] Date of Birth: Jul 16, 1950  Today's Date: 08/03/2019 OT Individual Time: 4081-4481 OT Individual Time Calculation (min): 28 min    Short Term Goals: Week 1:  OT Short Term Goal 1 (Week 1): Pt will transfer to drop arm BSC with mod A +2 with LRAD/ method OT Short Term Goal 1 - Progress (Week 1): Met OT Short Term Goal 2 (Week 1): Pt will perform sit to stand with max cues with mod A in prep for clothing management OT Short Term Goal 2 - Progress (Week 1): Met OT Short Term Goal 3 (Week 1): Pt will be able to maintain static sitting balance in prep for ADL tasks with consistant mod A OT Short Term Goal 3 - Progress (Week 1): Met OT Short Term Goal 4 (Week 1): Pt will incorporate left UE in functional tasks with minimal cuing OT Short Term Goal 4 - Progress (Week 1): Met Week 2:  OT Short Term Goal 1 (Week 2): Pt will complete bathing at sit > stand with mod assist OT Short Term Goal 1 - Progress (Week 2): Met OT Short Term Goal 2 (Week 2): Pt will don pants with mod assist sit > stand OT Short Term Goal 2 - Progress (Week 2): Progressing toward goal OT Short Term Goal 3 (Week 2): Pt will complete toilet transfers mod assist 3 out of 5 times to demonstate consistency OT Short Term Goal 3 - Progress (Week 2): Met Week 3:  OT Short Term Goal 1 (Week 3): Pt will don pants with mod assist sit > stand OT Short Term Goal 2 (Week 3): Pt will complete sit > stand with min assist during self-care tasks OT Short Term Goal 2 - Progress (Week 3): Met OT Short Term Goal 3 (Week 3): Pt will complete UB dressing with min assist OT Short Term Goal 3 - Progress (Week 3): Met OT Short Term Goal 4 (Week 3): Pt will complete toilet transfers with mod assist to Rt and Lt OT Short Term Goal 4 - Progress (Week 3): Met Week 4:  OT Short Term Goal 1 (Week 4): STG = LTGs due to remaining LOS  Skilled Therapeutic  Interventions/Progress Updates:    Pt seen this session for NMR of LUE. Pt received in w/c and agreeable to therapy. Pt demonstrated the AROM he currently has.  Using a 1# dowel bar, worked on Bilateral ROM tasks to focus on wrist extension, elbow flexion and sh flexion using AROM and isometric holds.  Pt needed frequent cues to hold an upright posture pulling shoulder blades back for improved alignment for LUE movement.  Pt can achieve posture easily but fatigues quickly.   Also worked on rotation of trunk, reaching alternating arms at an angle with increased A needed to rotate fully to the right but pt able to get into this position with minimal guiding. Reviewed simple home exercises he can do at the kitchen counter or table with "cleaning" the table with a dish towel and also with B hands on a basket ball rolling ball side to side and reaching forward. Pt participated well and is excited to be going to out pt therapy.   Pt resting in wc with belt alarm on and all needs met.  Therapy Documentation Precautions:  Precautions Precautions: Fall Precaution Comments: permissive HTN initially (<220/120) then normotension, L sided weakness, aspiration precautions (dysphagia 2 with thins), ; L hemiparesis Restrictions Weight Bearing Restrictions:  No  Pain: Pain Assessment Pain Scale: 0-10 Pain Score: 0-No pain ADL: ADL Upper Body Bathing: Minimal assistance Where Assessed-Upper Body Bathing: Sitting at sink Lower Body Bathing: Maximal assistance Where Assessed-Lower Body Bathing: Sitting at sink Upper Body Dressing: Maximal assistance Where Assessed-Upper Body Dressing: Sitting at sink Lower Body Dressing: Maximal assistance Where Assessed-Lower Body Dressing: Sitting at sink, Standing at sink Toileting: Not assessed Toilet Transfer: Not assessed   Therapy/Group: Individual Therapy  [place] 08/03/2019, 10:09 AM

## 2019-08-03 NOTE — Progress Notes (Signed)
Speech Language Pathology Daily Session Note  Patient Details  Name: Russell Russell MRN: [id number] Date of Birth: Jun 19, 1950  Today's Date: 08/03/2019 SLP Individual Time: 1125-1209 SLP Individual Time Calculation (min): 44 min  Short Term Goals: Week 4: SLP Short Term Goal 1 (Week 4): STG=LTG due to short ELOS  Skilled Therapeutic Interventions: Skilled ST services focused on cognitive skills. SLP facilitated recall of medication name/function/times per day given list, pt required max A verbal cues to navigate medication list and apply "chunking" strategy. SLP rewrote list into "chunking" categories to aid in recall and organization, pt then required min A verbal cues for recall of medication with list. Pt completed x1 medication in BID pill organizer with x1 initial error noted. Pt required mod A verbal cues to increase vocal intensity at phrase/sentence level during informal conversation. Pt completed 25 repetitions of EMST set at 7 cm H20 with self-perceived effortful score of 7 out 10 and min A verbal cues to recall times per day to complete exercise. Pt continues to demonstrate deficits in daily recall, denying earlier therapy events, however with min A verbal cues became to recall activities within the therapy sessions. Pt was left in room with call bell within reach and chair alarm set. ST recommends to continue skilled ST services.      Pain Pain Assessment Pain Score: 0-No pain  Therapy/Group: Individual Therapy  Anniemae Haberkorn  [facility] 08/03/2019, 2:16 PM

## 2019-08-03 NOTE — Progress Notes (Signed)
Occupational Therapy Session Note  Patient Details  Name: Russell Russell MRN: [id number] Date of Birth: Mar 09, 1951  Today's Date: 08/03/2019 OT Individual Time: [id number] OT Individual Time Calculation (min): 60 min    Short Term Goals: Week 4:  OT Short Term Goal 1 (Week 4): STG = LTGs due to remaining LOS  Skilled Therapeutic Interventions/Progress Updates:    Treatment session with focus on functional transfers, self-care retraining, dynamic sitting and standing balance, and LUE NMR.  Pt received supine in bed declining bathing but reporting agreeable to shower tomorrow.  Pt completed bed mobility with Supervision to sit to EOB.  Completed squat pivot transfer bed > w/c with min assist.  Noted pt to be incontinent of urine, therefore engaged in LB bathing and dressing at sit > stand level at sink.  Pt completed all standing at min assist level this session with minimal pushing noted.  Pt able to complete hygiene in standing with min assist for standing balance.  Completed LB dressing with pt able to thread and pull up pants with increased time, however still requires assistance with donning socks.  Pt able to utilize shoe funnel with setup and cues for sequencing, especially when donning Lt shoe.  Completed squat pivot transfer to therapy mat with mod assist, multimodal cues for anterior weight shift as pt pushing posteriorly when transferring to mat.  Engaged in [place] ball activity with focus on increased ROM and attention to LUE.  Therapist providing tactile cues at Lt elbow to facilitate increased reach.  Attempted peg board activity with pt unable to pick up pegs from container due to decreased sensation/proprioception in LUE, requiring modification to picking up pegs from therapist's palm to increase success.  Pt demonstrating difficulty with [facility] with pegs to place in pegboard, requiring modification of task to remove pegs.  Squat pivot to Lt to w/c with min assist.  Pt left upright in w/c with  breakfast tray setup, seat belt alarm on, and all needs in reach.   Therapy Documentation Precautions:  Precautions Precautions: Fall Precaution Comments: permissive HTN initially (<220/120) then normotension, L sided weakness, aspiration precautions (dysphagia 2 with thins), ; L hemiparesis Restrictions Weight Bearing Restrictions: No General:   Vital Signs: Therapy Vitals Temp: 98.1 F (36.7 C) Pulse Rate: 88 Resp: 18 BP: 111/86 Patient Position (if appropriate): Lying Oxygen Therapy SpO2: 99 % O2 Device: Room Air Pain: Pain Assessment Pain Score: 0-No pain   Therapy/Group: Individual Therapy  [name] 08/03/2019, 2:58 PM

## 2019-08-03 NOTE — Progress Notes (Signed)
Physical Therapy Session Note  Patient Details  Name: Russell Russell MRN: [id number] Date of Birth: 08-Mar-1951  Today's Date: 08/03/2019 PT Individual Time: 1000-1100 PT Individual Time Calculation (min): 60 min   Short Term Goals: Week 1:  PT Short Term Goal 1 (Week 1): pt will transfer supine<>sitting EOB with mod A PT Short Term Goal 1 - Progress (Week 1): Met PT Short Term Goal 2 (Week 1): Pt will transfer sit<>stand with LRAD max A PT Short Term Goal 2 - Progress (Week 1): Met PT Short Term Goal 3 (Week 1): Pt will transfer bed<>chair with LRAD max A PT Short Term Goal 3 - Progress (Week 1): Met Week 2:  PT Short Term Goal 1 (Week 2): pt will transfer bed<>chair min A PT Short Term Goal 1 - Progress (Week 2): Met PT Short Term Goal 2 (Week 2): pt will transfer sit<>stand CGA PT Short Term Goal 2 - Progress (Week 2): Progressing toward goal PT Short Term Goal 3 (Week 2): Pt will ambulate 44f with LRAD max A of 1 PT Short Term Goal 3 - Progress (Week 2): Progressing toward goal Week 3:  PT Short Term Goal 1 (Week 3): pt will transfer sit<>stand CGA PT Short Term Goal 1 - Progress (Week 3): Progressing toward goal PT Short Term Goal 2 (Week 3): Pt will ambulate 265fwith LRAD max A of 1 PT Short Term Goal 2 - Progress (Week 3): Progressing toward goal PT Short Term Goal 3 (Week 3): Pt will perform WC mobility [id number] Short Term Goal 3 - Progress (Week 3): Progressing toward goal  Skilled Therapeutic Interventions/Progress Updates:    PAIN Denies pain  Pt initially oob in wc and agreeable to session. STS using R handrail and gait x 56f33fmin assist, cues for posture, min assist for advancement/clearance of LE thru swing.  Pt w/approx 20 degrees knee flexion and maintains approx 20degrees of hip flexion on L at terminal stance and mild L lean.  Pregait: Standing w/hi/lo table on R and mirror on L for feedback worked on advancement of RLE, knee extension in terminal  swing/initial contact to loading, hip protraction/extension repeated forward/back x 6-9reps Rested in sitting Repeated above then added challenge of maintaining hip extension/protraction w/advancement from loading to mid/term stance on L which is very difficult for pt, tends to return to retraction of hip/flexion of knee. Repeated x 5 Rested in sitting.   Gait [id number] hallway rail w/min assist and multimodal cues to facilitate advancement of LLE thru swing, knee extension/hipprotraction/extension, upright trunk and gaze from loading to term stance w/good carryover from above training and improved gait mechanics.    Multitask activity w/dual UE use - standing at high low table w/cues to maintain L knee extension and to decrease tendency to lean to L performed peg puzzle obtaining peg w/L hand gross motor task then transferring to R for placement in mat hole.  Able to follow directions w/picture card without assist, occasional hand over hand assist w/LUE but 75% gross motor performed without assist.    Therapy Documentation Precautions:  Precautions Precautions: Fall Precaution Comments: permissive HTN initially (<220/120) then normotension, L sided weakness, aspiration precautions (dysphagia 2 with thins), ; L hemiparesis Restrictions Weight Bearing Restrictions: No    Therapy/Group: Individual Therapy  BarbaCallie Fielding  [place]/2021, 1:01 PM

## 2019-08-04 ENCOUNTER — Inpatient Hospital Stay (HOSPITAL_COMMUNITY): Payer: BLUE CROSS/BLUE SHIELD | Admitting: Speech Pathology

## 2019-08-04 ENCOUNTER — Ambulatory Visit (INDEPENDENT_AMBULATORY_CARE_PROVIDER_SITE_OTHER): Payer: BLUE CROSS/BLUE SHIELD | Admitting: *Deleted

## 2019-08-04 ENCOUNTER — Inpatient Hospital Stay (HOSPITAL_COMMUNITY): Payer: BLUE CROSS/BLUE SHIELD | Admitting: Occupational Therapy

## 2019-08-04 ENCOUNTER — Inpatient Hospital Stay (HOSPITAL_COMMUNITY): Payer: BLUE CROSS/BLUE SHIELD

## 2019-08-04 DIAGNOSIS — I6340 Cerebral infarction due to embolism of unspecified cerebral artery: Secondary | ICD-10-CM | POA: Diagnosis not present

## 2019-08-04 LAB — BASIC METABOLIC PANEL
Anion gap: 8 (ref 5–15)
BUN: 15 mg/dL (ref 8–23)
CO2: 27 mmol/L (ref 22–32)
Calcium: 9.7 mg/dL (ref 8.9–10.3)
Chloride: 105 mmol/L (ref 98–111)
Creatinine, Ser: 1.02 mg/dL (ref 0.61–1.24)
GFR calc Af Amer: >60 mL/min (ref >60)
GFR calc non Af Amer: >60 mL/min (ref >60)
Glucose, Bld: 110 mg/dL — ABNORMAL HIGH (ref 70–99)
Potassium: 4.3 mmol/L (ref 3.5–5.1)
Sodium: 140 mmol/L (ref 135–145)

## 2019-08-04 LAB — CUP PACEART REMOTE DEVICE CHECK
Date Time Interrogation Session: 20210524214330
Implantable Pulse Generator Implant Date: 20210423

## 2019-08-04 MED ORDER — FOLIC ACID 1 MG PO TABS
1.0000 mg | ORAL_TABLET | Freq: Every day | ORAL | 3 refills | Status: DC
Start: 2019-08-04 — End: 2019-09-24

## 2019-08-04 MED ORDER — ACETAMINOPHEN 325 MG PO TABS: 650.0000 mg | ORAL_TABLET | ORAL | Status: AC | PRN

## 2019-08-04 MED ORDER — CLOPIDOGREL BISULFATE 75 MG PO TABS: 75.0000 mg | ORAL_TABLET | Freq: Every day | ORAL | 1 refills | Status: DC

## 2019-08-04 MED ORDER — NICOTINE 14 MG/24HR TD PT24: MEDICATED_PATCH | TRANSDERMAL | 0 refills | Status: DC

## 2019-08-04 MED ORDER — LISINOPRIL 5 MG PO TABS: 5.0000 mg | ORAL_TABLET | Freq: Every day | ORAL | 0 refills | Status: DC

## 2019-08-04 MED ORDER — ADULT MULTIVITAMIN W/MINERALS CH: 1.0000 | ORAL_TABLET | Freq: Every day | ORAL | Status: AC

## 2019-08-04 MED ORDER — METOPROLOL TARTRATE 25 MG PO TABS: 25.0000 mg | ORAL_TABLET | Freq: Two times a day (BID) | ORAL | 0 refills | Status: DC

## 2019-08-04 MED ORDER — ATORVASTATIN CALCIUM 40 MG PO TABS
40.0000 mg | ORAL_TABLET | Freq: Every day | ORAL | 1 refills | Status: DC
Start: 2019-08-04 — End: 2019-12-29

## 2019-08-04 MED ORDER — PANTOPRAZOLE SODIUM 40 MG PO TBEC: 40.0000 mg | DELAYED_RELEASE_TABLET | Freq: Every day | ORAL | 0 refills | Status: DC

## 2019-08-04 MED ORDER — CALCIUM POLYCARBOPHIL 625 MG PO TABS: 1250.0000 mg | ORAL_TABLET | Freq: Two times a day (BID) | ORAL | 0 refills | Status: DC

## 2019-08-04 NOTE — Progress Notes (Signed)
Speech Language Pathology Discharge Summary  Patient Details  Name: Donaldson Richter MRN: [id number] Date of Birth: 08-04-1950  Today's Date: 08/04/2019 SLP Individual Time: 1130-1155 SLP Individual Time Calculation (min): 25 min   Skilled Therapeutic Interventions:  Skilled treatment session focused on cognitive goals. SLP facilitated session by re-administering the MoCA-Basic. Patient scored 18/30 points with a score of 26 or above considered normal. Patient continues to demonstrate deficits in short-term recall. However, patient demonstrates improved recall of functional information. Patient left upright in the wheelchair with alarm on and all needs within reach. Continue with current plan of care.   Patient has met 8 of 8 long term goals.  Patient to discharge at overall Supervision;Min level.   Reasons goals not met: N/A  Clinical Impression/Discharge Summary: Patient has made excellent gains and has met 8 of 8 LTGs this admission. Currently, patient is consuming regular textures with thin liquids with minimal overt s/s of aspiration and requires overall supervision level verbal cues for use of swallowing compensatory strategies. Patient demonstrates improved overall speech intelligibility and is ~90% intelligible at the sentence level with Min verbal cues for use of speech intelligibility strategies.  Patient also requires overall Min A verbal cues to complete functional and familiar tasks safely in regards to selective attention, functional problem solving, recall with use of compensatory strategies and emergent awareness. Patient and family education is complete and patient will discharge home with 24 hour supervision from family. Patient would benefit from f/u SLP services to maximize his cognitive and swallowing function as well as his speech intelligibility in order to maximize his overall functional independence and reduce caregiver burden.   Care Partner:  Caregiver Able to Provide  Assistance: Yes     Recommendation:  24 hour supervision/assistance;Outpatient SLP  Rationale for SLP Follow Up: Maximize cognitive function and independence;Reduce caregiver burden;Maximize functional communication;Maximize swallowing safety   Equipment: N/A   Reasons for discharge: Discharged from hospital;Treatment goals met   Patient/Family Agrees with Progress Made and Goals Achieved: Yes    Benigna Delisi 08/04/2019, 6:36 AM

## 2019-08-04 NOTE — Progress Notes (Signed)
Occupational Therapy Discharge Summary  Patient Details  Name: Russell Russell MRN: [id number] Date of Birth: 06/03/1950   Patient has met 69 of 25 long term goals due to improved activity tolerance, improved balance, postural control, ability to compensate for deficits, functional use of  LEFT upper and LEFT lower extremity, improved awareness and improved coordination.  Patient to discharge at [facility] Assist level.  Patient's care partner is independent to provide the necessary physical and cognitive assistance at discharge.  Patient's wife has been present for hands on education and demonstrates ability to provide necessary physical and cognitive assistance at d/c.  Reasons goals not met: N/A  Recommendation:  Patient will benefit from ongoing skilled OT services in outpatient setting to continue to advance functional skills in the area of BADL and Reduce care partner burden.  Equipment: drop arm BSC and tub transfer bench  Reasons for discharge: treatment goals met and discharge from hospital  Patient/family agrees with progress made and goals achieved: Yes  OT Discharge Precautions/Restrictions  Precautions Precautions: Fall Precaution Comments: L hemiparesis Restrictions Weight Bearing Restrictions: No Pain Pain Assessment Pain Scale: 0-10 Pain Score: 0-No pain ADL ADL Eating: Supervision/safety, Set up Where Assessed-Eating: Wheelchair Grooming: Supervision/safety, Setup Where Assessed-Grooming: Wheelchair Upper Body Bathing: Supervision/safety Where Assessed-Upper Body Bathing: Shower Lower Body Bathing: Contact guard Where Assessed-Lower Body Bathing: Shower Upper Body Dressing: Supervision/safety, Setup Where Assessed-Upper Body Dressing: Sitting at sink, Wheelchair Lower Body Dressing: Minimal assistance Where Assessed-Lower Body Dressing: Sitting at sink, Wheelchair Toileting: Minimal assistance Where Assessed-Toileting: Bedside Commode Toilet Transfer:  Minimal assistance Armed forces technical officer Method: Engineer, water: Drop arm bedside commode Tub/Shower Transfer: Minimal assistance Clinical cytogeneticist Method: Engineer, production: Facilities manager: Minimal assistance Social research officer, government Method: Stand pivot Youth worker: Radio broadcast assistant, Grab bars Vision Baseline Vision/History: Wears glasses Wears Glasses: At all times Patient Visual Report: No change from baseline Alignment/Gaze Preference: Within Defined Limits Tracking/Visual Pursuits: Able to track stimulus in all quads without difficulty Saccades: Decreased speed of saccadic movement Visual Fields: No apparent deficits Perception  Perception: Impaired Inattention/Neglect: Does not attend to left side of body(mild impairment, much improved from eval) Praxis Praxis: Impaired Praxis Impairment Details: Motor planning(mild impairment, improved from eval) Cognition Overall Cognitive Status: Impaired/Different from baseline Arousal/Alertness: Awake/alert Orientation Level: Oriented X4 Attention: Selective Sustained Attention: Appears intact Selective Attention: Impaired Selective Attention Impairment: Functional complex;Verbal complex Memory: Impaired Memory Impairment: Decreased recall of new information;Decreased short term memory Decreased Short Term Memory: Functional complex;Verbal complex Awareness: Impaired Awareness Impairment: Emergent impairment Problem Solving: Impaired Problem Solving Impairment: Functional complex;Verbal complex Behaviors: Impulsive Safety/Judgment: Impaired Comments: mild impulsivity Sensation Sensation Light Touch: Impaired by gross assessment(mild impairment in LUE) Light Touch Impaired Details: Impaired LUE;Impaired LLE Proprioception: Impaired Detail Proprioception Impaired Details: Impaired LLE;Impaired LUE Coordination Gross Motor Movements are Fluid and  Coordinated: No Fine Motor Movements are Fluid and Coordinated: No Coordination and Movement Description: grossly uncoordinated due to L hemiparesis/pushing, decreased standing balance, generalized weakness, and fatigue Finger Nose Finger Test: WFL on R UE, requires increased time and compensation to complete with LUE Motor  Motor Motor: Hemiplegia;Abnormal postural alignment and control Motor - Skilled Clinical Observations: grossly uncoordinated due to L hemiparesis/pushing, decreased standing balance, generalized weakness, and fatigue. However improved since eval Mobility  Bed Mobility Bed Mobility: Rolling Right;Rolling Left;Supine to Sit;Sit to Supine Rolling Right: Supervision/verbal cueing Rolling Left: Supervision/Verbal cueing Supine to Sit: Supervision/Verbal cueing Sit to Supine: Supervision/Verbal cueing Transfers Sit  to Stand: Minimal Assistance - Patient > 75% Stand to Sit: Minimal Assistance - Patient > 75%  Trunk/Postural Assessment  Cervical Assessment Cervical Assessment: Exceptions to WFL(forward head) Thoracic Assessment Thoracic Assessment: Exceptions to WFL(rounded shoulders) Lumbar Assessment Lumbar Assessment: Exceptions to WFL(posterior pelvic tilt) Postural Control Postural Control: Deficits on evaluation  Balance Balance Balance Assessed: Yes Static Sitting Balance Static Sitting - Balance Support: Bilateral upper extremity supported;Feet supported Static Sitting - Level of Assistance: 6: Modified independent (Device/Increase time) Dynamic Sitting Balance Dynamic Sitting - Balance Support: Bilateral upper extremity supported;Feet supported Dynamic Sitting - Level of Assistance: 6: Modified independent (Device/Increase time) Static Standing Balance Static Standing - Balance Support: Right upper extremity supported Static Standing - Level of Assistance: 4: Min assist Dynamic Standing Balance Dynamic Standing - Balance Support: Right upper extremity  supported Dynamic Standing - Level of Assistance: 4: Min assist Extremity/Trunk Assessment RUE Assessment RUE Assessment: Within Functional Limits LUE Assessment LUE Assessment: Exceptions to [facility] Active Range of Motion (AROM) Comments: able to move within full range General Strength Comments: 4/5 shoulder, 3+/5 elbow LUE Body System: Neuro Brunstrum levels for arm and hand: Arm;Hand Brunstrum level for arm: Stage V Relative Independence from Synergy Brunstrum level for hand: Stage V Independence from basic synergies   Sayra Frisby, [facility] 08/04/2019, 8:30 AM

## 2019-08-04 NOTE — Progress Notes (Signed)
Occupational Therapy Session Note  Patient Details  Name: Russell Russell MRN: [id number] Date of Birth: October 21, 1950  Today's Date: 08/04/2019 OT Individual Time: 9244-6286 OT Individual Time Calculation (min): 42 min    Short Term Goals: Week 4:  OT Short Term Goal 1 (Week 4): STG = LTGs due to remaining LOS  Skilled Therapeutic Interventions/Progress Updates:  Patient met seated in wc in agreement with OT treatment session with focus on NMR and functional transfers as detailed below. Patient self-propelled wc to ADL apartment with hemi technique and verbal cues for avoiding obstacles on L. Patient report no toileting needs but in agreement with simulated toilet transfer via squat-pivot transfer requiring Min A. In therapy gym, patient engaged in [place] with card sorting in standing and incorporation of LUE with increased independence. Session concluded with patient seated in wc with call bell within reach, belt alarm activated, and all needs met.   Therapy Documentation Precautions:  Precautions Precautions: Fall Precaution Comments: L hemiparesis Restrictions Weight Bearing Restrictions: No General:    Therapy/Group: Individual Therapy  Chamille Werntz R [place] 08/04/2019, 12:15 PM

## 2019-08-04 NOTE — Progress Notes (Signed)
[facility]link Summary Report / Loop Recorder 

## 2019-08-04 NOTE — Progress Notes (Signed)
Physical Therapy Discharge Summary  Patient Details  Name: Russell Russell MRN: [id number] Date of Birth: 07-08-1950  Today's Date: 08/04/2019 PT Individual Time: 9326-7124 PT Individual Time Calculation (min): 68 min   Patient has met 8 of 8 long term goals due to improved activity tolerance, improved balance, improved postural control, increased strength, improved attention, improved awareness and improved coordination. Patient to discharge at a wheelchair level [place]. Patient's care partner is independent to provide the necessary physical assistance at discharge. Pt's wife, Russell Russell, attended formal education training twice and verbalized and demonstrated confidence with all transfers to ensure safe discharge home. Therapist educated pt/pt's wife on importance of avoiding ambulating at home without therapy for safety concerns; both in agreement. Pt with no further questions regarding mobility status.   All goals met  Recommendation:  Patient will benefit from ongoing skilled PT services in outpatient setting to continue to advance safe functional mobility, address ongoing impairments in transfers, LE strength, dynamic standing balance/coordination, ambulation, NMR, motor control/planning, endurance, and to minimize fall risk.  Equipment: WC  Reasons for discharge: treatment goals met  Patient/family agrees with progress made and goals achieved: Yes  Today's Interventions: Received pt sitting in WC, pt agreeable to therapy, and denied any pain during session. Session focused on discharge planning, functional mobility/transfers, LE strength, dynamic standing balance/coordination, simulated car transfers, gait training, NMR, motor control/planning, and improved activity tolerance. Pt performed WC mobility 179f using L hemi technique, increased time, and supervision to ortho gym. Pt required minimal verbal cues to avoid obstacles on L side. Pt performed simulated car transfer min A without  AD with verbal cues for turning technique and safety. [facility] Pt transferred stand<>pivot min A x 2 trials and performed bed mobility with supervision on flat bed. Pt required verbal cues to attend to L LE instead of letting it dangle off the side of the bed. Pt [facility] and ambulated 262fx 2 trials with rail on R side min A. Pt required manual facilitation to advance L LE, block occasional L knee buckling, and weight shift to R. Pt required verbal cues for stepping sequence, foot placement, upright posture, and weight shifting to R. [facility] and transferred stand<>pivot on/off Nustep x 2 trials min A. Pt performed bilateral UE/LE strengthening on Nustep at workload 5 for 10 minutes for a total of 373 steps. Pt with improvement maintaining L UE on handgrip throughout activity. Pt performed bilateral LE strengthening on Kinetron at 30cm/sec for 1 minute x 2 trials with supervision and with bilateral UE support. [facility]. Concluded session with pt sitting in WC, needs within reach, and seatbelt alarm on. Therapist provided ensure for pt.   PT Discharge Precautions/Restrictions Precautions Precautions: Fall Precaution Comments: L hemiparesis Restrictions Weight Bearing Restrictions: No Cognition Overall Cognitive Status: Impaired/Different from baseline Arousal/Alertness: Awake/alert Orientation Level: Oriented X4 Attention: Selective Sustained Attention: Appears intact Selective Attention: Impaired Memory Impairment: Decreased recall of new information;Decreased short term memory Awareness: Impaired Problem Solving: Impaired Behaviors: Impulsive Safety/Judgment: Impaired Comments: mild impulsivity Sensation Sensation Light Touch: Appears Intact Light Touch Impaired Details: Impaired LUE;Impaired LLE Proprioception: Impaired by gross  assessment Proprioception Impaired Details: Impaired LLE;Impaired LUE Coordination Gross Motor Movements are Fluid and Coordinated: No Fine Motor Movements are Fluid and Coordinated: No Coordination and Movement Description: grossly uncoordinated due to L hemiparesis/pushing, decreased standing balance,  generalized weakness, and fatigue Finger Nose Finger Test: WFL on R UE, requires increased time and compensation to complete with LUE Heel Shin Test: WFL on R LE, decreased ROM on L LE Motor  Motor Motor: Hemiplegia;Abnormal postural alignment and control Motor - Skilled Clinical Observations: grossly uncoordinated due to L hemiparesis/pushing, decreased standing balance, generalized weakness, and fatigue. However improved since eval  Mobility Bed Mobility Bed Mobility: Rolling Right;Rolling Left;Supine to Sit;Sit to Supine Rolling Right: Supervision/verbal cueing Rolling Left: Supervision/Verbal cueing Supine to Sit: Supervision/Verbal cueing Sit to Supine: Supervision/Verbal cueing Transfers Transfers: Sit to Stand;Stand to Sit;Stand Pivot Transfers;Squat Pivot Transfers Sit to Stand: Minimal Assistance - Patient > 75% Stand to Sit: Minimal Assistance - Patient > 75% Stand Pivot Transfers: Minimal Assistance - Patient > 75% Stand Pivot Transfer Details: Verbal cues for technique;Verbal cues for precautions/safety Stand Pivot Transfer Details (indicate cue type and reason): verbal cues for foot positioning, hand placement, and technique Squat Pivot Transfers: Minimal Assistance - Patient > 75% Transfer (Assistive device): None Locomotion  Gait Ambulation: Yes Gait Assistance: Minimal Assistance - Patient > 75% Gait Distance (Feet): 30 Feet Assistive device: Other (Comment)(rail on R side) Gait Assistance Details: Tactile cues for weight shifting;Verbal cues for technique;Verbal cues for gait pattern;Manual facilitation for weight shifting;Tactile cues for posture;Verbal cues for  sequencing;Manual facilitation for placement Gait Assistance Details: verbal cues for stepping sequence, weight shifiting, and posture. Tactile cues for weight shifiting to R and posture. Manual facilitation to advance L LE and prevent L knee buckling Gait Gait: Yes Gait Pattern: Impaired Gait Pattern: Decreased step length - right;Decreased step length - left;Decreased stance time - right;Decreased stride length;Decreased hip/knee flexion - left;Decreased weight shift to right;Lateral trunk lean to left;Narrow base of support;Decreased dorsiflexion - left;Poor foot clearance - left;Poor foot clearance - right;Trunk flexed;Decreased trunk rotation Gait velocity: decreased Wheelchair Mobility Wheelchair Mobility: Yes Wheelchair Assistance: Chartered loss adjuster: Right upper extremity;Right lower extremity Wheelchair Parts Management: Needs assistance Distance: 115f  Trunk/Postural Assessment  Cervical Assessment Cervical Assessment: Exceptions to WFL(forward head) Thoracic Assessment Thoracic Assessment: Exceptions to WFL(rounded shoulders) Lumbar Assessment Lumbar Assessment: Exceptions to WFL(posterior pelvic tilt) Postural Control Postural Control: Deficits on evaluation  Balance Balance Balance Assessed: Yes Static Sitting Balance Static Sitting - Balance Support: Bilateral upper extremity supported;Feet supported Static Sitting - Level of Assistance: 6: Modified independent (Device/Increase time) Dynamic Sitting Balance Dynamic Sitting - Balance Support: Bilateral upper extremity supported;Feet supported Dynamic Sitting - Level of Assistance: 6: Modified independent (Device/Increase time) Static Standing Balance Static Standing - Balance Support: Right upper extremity supported Static Standing - Level of Assistance: 4: Min assist Dynamic Standing Balance Dynamic Standing - Balance Support: Right upper extremity supported Dynamic Standing - Level of  Assistance: 4: Min assist Extremity Assessment  RLE Assessment RLE Assessment: Exceptions to [facility] Strength Comments: grossly generalized to 4/5 LLE Assessment LLE Assessment: Exceptions to [facility] Strength Comments: grossly generalized to 4-/5 (except hip adduction and knee flexion 3+/5)  AAlfonse AlpersPT, DPT  08/04/2019, 7:52 AM

## 2019-08-04 NOTE — Progress Notes (Signed)
Occupational Therapy Session Note  Patient Details  Name: Russell Russell MRN: [id number] Date of Birth: [birth year]  Today's Date: 08/04/2019 OT Individual Time: [id number] OT Individual Time Calculation (min): 65 min    Short Term Goals: Week 4:  OT Short Term Goal 1 (Week 4): STG = LTGs due to remaining LOS  Skilled Therapeutic Interventions/Progress Updates:    Completed ADL retraining at overall min assist level with bathing at shower level and dressing at sit > stand level at sink.  Pt received supine in bed asleep, but with encouragement pt agreeable to therapy session.  Pt completed bed mobility with close supervision to come to EOB.  Squat pivot to w/c min assist.  Completed stand pivot transfers w/c <> tub bench in room shower with min cues and contact guard assist.  Pt completed bathing at sit > stand level in room shower with min cues for use of BUE applying soap and washing body and CGA for standing balance when washing buttocks.  Pt utilized figure 4 position, with cues, to wash lower legs and feet.  Engaged in dressing at sit > stand level at sink with pt able to recall hemi-dressing technique and CGA when pulling pants over hips.  Pt required assistance with donning socks, utilizing backward chaining and then able to don shoes with use of shoe funnel and elastic laces.  Pt completed oral care in sitting with good use of LUE as gross assist when setting up toothpaste and toothbrush.  Incorporated LUE throughout session while educating on functional use to continue to regain gross and fine motor control.  Pt remained upright in w/c with seat belt alarm on, breakfast tray setup, and all needs in reach.  Therapy Documentation Precautions:  Precautions Precautions: Fall Precaution Comments: L hemiparesis Restrictions Weight Bearing Restrictions: No Pain: Pain Assessment Pain Scale: 0-10 Pain Score: 0-No pain ADL: ADL Eating: Supervision/safety, Set up Where Assessed-Eating:  Wheelchair Grooming: Supervision/safety, Setup Where Assessed-Grooming: Wheelchair Upper Body Bathing: Supervision/safety Where Assessed-Upper Body Bathing: Shower Lower Body Bathing: Contact guard Where Assessed-Lower Body Bathing: Shower Upper Body Dressing: Supervision/safety, Setup Where Assessed-Upper Body Dressing: Sitting at sink, Wheelchair Lower Body Dressing: Minimal assistance Where Assessed-Lower Body Dressing: Sitting at sink, Wheelchair Toileting: Minimal assistance Where Assessed-Toileting: Bedside Commode Toilet Transfer: Minimal assistance Armed forces technical officer Method: Engineer, water: Drop arm bedside commode Tub/Shower Transfer: Minimal assistance Tub/Shower Transfer Method: Engineer, production: Facilities manager: Minimal assistance Social research officer, government Method: Stand pivot Youth worker: Radio broadcast assistant, Grab bars   Therapy/Group: Individual Therapy  Felesia Stahlecker, [place] 08/04/2019, 8:30 AM

## 2019-08-04 NOTE — Progress Notes (Signed)
[facility] PHYSICAL MEDICINE & REHABILITATION PROGRESS NOTE  Subjective/Complaints: Patient seen sitting up this morning working with therapies.  He states he slept well overnight.  He states his looking forward to discharge tomorrow.  ROS: Denies CP, SOB, N/V/D  Objective: Vital Signs: Blood pressure (!) 132/96, pulse 75, temperature 98.6 F (37 C), temperature source Oral, resp. rate 16, height 5\' 10"  (1.778 m), weight 60.6 kg, SpO2 100 %. No results found. No results for input(s): WBC, HGB, HCT, PLT in the last 72 hours. No results for input(s): NA, K, CL, CO2, GLUCOSE, BUN, CREATININE, CALCIUM in the last 72 hours.  Physical Exam: BP (!) 132/96 (BP Location: Right Arm)   Pulse 75   Temp 98.6 F (37 C) (Oral)   Resp 16   Ht 5\' 10"  (1.778 m)   Wt 60.6 kg   SpO2 100%   BMI 19.18 kg/m  Constitutional: No distress . Vital signs reviewed. HENT: Normocephalic.  Atraumatic. Eyes: EOMI. No discharge. Cardiovascular: No JVD. Respiratory: Normal effort.  No stridor. GI: Non-distended. Skin: Warm and dry.  Intact. Psych: Normal mood.  Normal behavior. Musc: No edema in extremities.  No tenderness in extremities. Neuro: Alert Motor: Left upper extremity: Shoulder abduction 4/5, distally 4/5, with apraxia and ataxia, stable Left lower extremity: Hip flexion 4/5, knee extension 4 +/5, ankle dorsiflexion 4+/5, stable Dysarthria, unchanged Left facial weakness, stable.   Assessment/Plan: 1. Functional deficits secondary to multiple anterior and posterior right brain infarcts which require 3+ hours per day of interdisciplinary therapy in a comprehensive inpatient rehab setting.  Physiatrist is providing close team supervision and 24 hour management of active medical problems listed below.  Physiatrist and rehab team continue to assess barriers to discharge/monitor patient progress toward functional and medical goals  Care Tool:  Bathing    Body parts bathed by patient: Left  arm, Chest, Abdomen, Front perineal area, Right upper leg, Left upper leg, Face, Right arm, Buttocks, Right lower leg, Left lower leg   Body parts bathed by helper: Front perineal area, Buttocks     Bathing assist Assist Level: Contact Guard/Touching assist     Upper Body Dressing/Undressing Upper body dressing   What is the patient wearing?: Pull over shirt    Upper body assist Assist Level: Supervision/Verbal cueing    Lower Body Dressing/Undressing Lower body dressing      What is the patient wearing?: Pants, Incontinence brief     Lower body assist Assist for lower body dressing: Contact Guard/Touching assist     Toileting Toileting Toileting Activity did not occur (Clothing management and hygiene only): N/A (no void or bm)  Toileting assist Assist for toileting: 2 Helpers     Transfers Chair/bed transfer  Transfers assist     Chair/bed transfer assist level: Minimal Assistance - Patient > 75%     Locomotion Ambulation   Ambulation assist   Ambulation activity did not occur: Safety/medical concerns(L hemi, decreased postural control/balance, fatigue)  Assist level: Minimal Assistance - Patient > 75% Assistive device: Other (comment)(hallway rail) Max distance: 30   Walk 10 feet activity   Assist  Walk 10 feet activity did not occur: Safety/medical concerns(L hemi, decreased postural control/balance, fatigue)  Assist level: Minimal Assistance - Patient > 75% Assistive device: (hallway rail)   Walk 50 feet activity   Assist Walk 50 feet with 2 turns activity did not occur: Safety/medical concerns(L hemi, decreased postural control/balance, fatigue)         Walk 150 feet activity  Assist Walk 150 feet activity did not occur: Safety/medical concerns(L hemi, decreased postural control/balance, fatigue)         Walk 10 feet on uneven surface  activity   Assist Walk 10 feet on uneven surfaces activity did not occur: Safety/medical  concerns(L hemi, decreased postural control/balance, fatigue)         Wheelchair     Assist Will patient use wheelchair at discharge?: Yes Type of Wheelchair: Manual    Wheelchair assist level: Supervision/Verbal cueing Max wheelchair distance: 67ft    Wheelchair 50 feet with 2 turns activity    Assist        Assist Level: Supervision/Verbal cueing   Wheelchair 150 feet activity     Assist     Assist Level: Dependent - Patient 0%      Medical Problem List and Plan: 1.  Left side weakness secondary to multiple scattered anterior and posterior right brain infarcts.  Status post loop recorder  Continue CIR  Repeat head CT personally reviewed, unchanged 2.  Antithrombotics: -DVT/anticoagulation: Lovenox  CBC within normal limits on 5/21             -antiplatelet therapy: Aspirin 325 mg daily and Plavix 75 mg daily x3 months then aspirin alone 3. Pain Management: Tylenol as needed. Well controlled 4. Mood: Provide emotional support             -antipsychotic agents: N/A 5. Neuropsych: This patient is ?fully capable of making decisions on his own behalf. 6. Skin/Wound Care: Routine skin checks 7. Fluids/Electrolytes/Nutrition: Routine in and outs.  BMP pending 8.  Hypertension.  Lopressor 12.5 mg twice daily, increased to 25 on 5/8.    Started Lisinopril 2.5mg  daily, increased to 5 on 5/19  Labile on 5/25  Monitor with increased mobility 9.  Hyperlipidemia. Lipitor 10. Post stroke dysphagia.    Advance to regular diet thins with compensatory strategies 11.  History of tobacco and alcohol use.  NicoDerm patch.  Provide counseling.    Monitor for withdrawal 12.  Moderate hypoalbuminemia  Supplement initiated on 4/28 13.  WBCs: Resolved  WBCs within normal limits on 5/21  RA latex elevated, CMV elevated -discussed with ID, may consider serial checks as outpatient.  Cont to monitor 13.  AKI-resolved with IVF  Echocardiogram reviewed, EF ~60%  IVF DC'd  on 5/17  Creatinine 0.84 on 5/17, labs pending 14.  Sepsis: Resolved  See #12  Remains afebrile  UA suspicious, urine culture multiple species; repeat UA suspicious again, urine culture insignificant growth  Blood cultures NG; repeat blood cultures no growth  Chest x-ray personally reviewed again, unremarkable for infection  Influenza negative  Covid negative  Empiric Septra d/ced, IV Vanco and cefepime DC'd  Procalcitonin within normal limits on 5/14  ID consulted-recommended CT abdomen/pelvis and chest-unremarkable for infection, + atelectasis.  ID signed off 15.  Prediabetes  Changed carb modified  CBG elevated on 5/22, labs pending   LOS: 28 days A FACE TO FACE EVALUATION WAS PERFORMED  Maille Halliwell Lorie Phenix 08/04/2019, 8:15 AM

## 2019-08-05 NOTE — Progress Notes (Signed)
Recreational Therapy Discharge Summary Patient Details  Name: Russell Russell MRN: [id number] Date of Birth: [birth year] Today's Date: 08/05/2019  Long term goals set: 1  Long term goals met: 1  Comments on progress toward goals: Pt is discharging home with wife today to provide/coordinate 24 hour supervision/assistance.  TR sessions focused on activity analysis identifying potential modifications, leisure education and discharge planning.  Medical issues (fever, chills, etc) limited pt's participation in treatment session in which pt had varying assist level needs.  Pt is stated to be discharging at overall Min assist level by treatment team although not consistently observed by LRT.   Reasons for discharge: discharge from hospital Patient/family agrees with progress made and goals achieved: Yes  Darnell Jeschke 08/05/2019, 9:36 AM

## 2019-08-05 NOTE — Progress Notes (Signed)
Pt ws DC with wife and all personal belongings. All questions were answered and pt states he will maintain all follow-up appts.  Doy Hutching, LPN

## 2019-08-05 NOTE — Progress Notes (Signed)
Inpatient Rehabilitation Care Coordinator  Discharge Note  The overall goal for the admission was met for:   Discharge location: Yes  Length of Stay: Yes, 29 Days  Discharge activity level: Yes, Min A  Home/community participation: Yes  Services provided included: MD, RD, PT, OT, SLP, RN, CM, TR, Pharmacy and SW  Financial Services: Private Insurance: [employer]  Follow-up services arranged: Outpatient: [facility]   Comments (or additional information): PT, OT, ST  Patient/Family verbalized understanding of follow-up arrangements: Yes  Individual responsible for coordination of the follow-up plan: [name] (spouse) [phone]  Confirmed correct DME delivered: Dyanne Iha 08/05/2019    Dyanne Iha

## 2019-08-05 NOTE — Progress Notes (Signed)
[place] PHYSICAL MEDICINE & REHABILITATION PROGRESS NOTE  Subjective/Complaints: Patient seen laying in bed this AM.  He states he slept well overnight. Wife at bedside.  She has questions regarding tobacco use at discharge.   ROS: Denies CP, SOB, N/V/D  Objective: Vital Signs: Blood pressure 111/81, pulse 79, temperature 98.8 F (37.1 C), temperature source Oral, resp. rate 16, height 5\' 10"  (1.778 m), weight 60.6 kg, SpO2 96 %. CUP PACEART REMOTE DEVICE CHECK  Result Date: 08/04/2019 [facility] summary report received. Battery status OK. Normal device function. No new symptom episodes, tachy episodes, brady, or pause episodes. No new AF episodes. Monthly summary reports and ROV/PRN  No results for input(s): WBC, HGB, HCT, PLT in the last 72 hours. Recent Labs    08/04/19 0846  NA 140  K 4.3  CL 105  CO2 27  GLUCOSE 110*  BUN 15  CREATININE 1.02  CALCIUM 9.7    Physical Exam: BP 111/81 (BP Location: Left Arm)   Pulse 79   Temp 98.8 F (37.1 C) (Oral)   Resp 16   Ht 5\' 10"  (1.778 m)   Wt 60.6 kg   SpO2 96%   BMI 19.18 kg/m  Constitutional: No distress . Vital signs reviewed. HENT: Normocephalic.  Atraumatic. Eyes: EOMI. No discharge. Cardiovascular: No JVD. Respiratory: Normal effort.  No stridor. GI: Non-distended. Skin: Warm and dry.  Intact. Psych: Normal mood.  Normal behavior. Musc: No edema in extremities.  No tenderness in extremities. Neuro: Alert Motor: Left upper extremity: Shoulder abduction 4/5, distally 4/5, with apraxia and ataxia, stable Left lower extremity: Hip flexion 4/5, knee extension 4 +/5, ankle dorsiflexion 4+/5, stable Dysarthria, stable Left facial weakness, stable.   Assessment/Plan: 1. Functional deficits secondary to multiple anterior and posterior right brain infarcts which require 3+ hours per day of interdisciplinary therapy in a comprehensive inpatient rehab setting.  Physiatrist is providing close team supervision and 24  hour management of active medical problems listed below.  Physiatrist and rehab team continue to assess barriers to discharge/monitor patient progress toward functional and medical goals  Care Tool:  Bathing    Body parts bathed by patient: Left arm, Chest, Abdomen, Front perineal area, Right upper leg, Left upper leg, Face, Right arm, Buttocks, Right lower leg, Left lower leg   Body parts bathed by helper: Front perineal area, Buttocks     Bathing assist Assist Level: Contact Guard/Touching assist     Upper Body Dressing/Undressing Upper body dressing   What is the patient wearing?: Pull over shirt    Upper body assist Assist Level: Supervision/Verbal cueing    Lower Body Dressing/Undressing Lower body dressing      What is the patient wearing?: Pants, Incontinence brief     Lower body assist Assist for lower body dressing: Contact Guard/Touching assist     Toileting Toileting Toileting Activity did not occur (Clothing management and hygiene only): N/A (no void or bm)  Toileting assist Assist for toileting: 2 Helpers     Transfers Chair/bed transfer  Transfers assist     Chair/bed transfer assist level: Minimal Assistance - Patient > 75%     Locomotion Ambulation   Ambulation assist   Ambulation activity did not occur: Safety/medical concerns(L hemi, decreased postural control/balance, fatigue)  Assist level: Minimal Assistance - Patient > 75% Assistive device: Other (comment)(rail on R) Max distance: 30   Walk 10 feet activity   Assist  Walk 10 feet activity did not occur: Safety/medical concerns(L hemi, decreased postural control/balance, fatigue)  Assist level: Minimal Assistance - Patient > 75% Assistive device: Other (comment)(rail on R)   Walk 50 feet activity   Assist Walk 50 feet with 2 turns activity did not occur: Safety/medical concerns(L hemi, fatigue, LE weakness, decreased balance)         Walk 150 feet activity   Assist  Walk 150 feet activity did not occur: Safety/medical concerns(L hemi, fatigue, LE weakness, decreased balance)         Walk 10 feet on uneven surface  activity   Assist Walk 10 feet on uneven surfaces activity did not occur: Safety/medical concerns(L hemi, fatigue, LE weakness, decreased balance)         Wheelchair     Assist Will patient use wheelchair at discharge?: Yes Type of Wheelchair: Manual    Wheelchair assist level: Supervision/Verbal cueing Max wheelchair distance: 11ft    Wheelchair 50 feet with 2 turns activity    Assist        Assist Level: Supervision/Verbal cueing   Wheelchair 150 feet activity     Assist     Assist Level: Moderate Assistance - Patient 50 - 74%      Medical Problem List and Plan: 1.  Left side weakness secondary to multiple scattered anterior and posterior right brain infarcts.  Status post loop recorder  DC today  Will see patient for transitional care management in 1-2 weeks post-discharge  Repeat head CT personally reviewed, unchanged 2.  Antithrombotics: -DVT/anticoagulation: Lovenox  CBC within normal limits on 5/21             -antiplatelet therapy: Aspirin 325 mg daily and Plavix 75 mg daily x3 months then aspirin alone 3. Pain Management: Tylenol as needed. Well controlled 4. Mood: Provide emotional support             -antipsychotic agents: N/A 5. Neuropsych: This patient is ?fully capable of making decisions on his own behalf. 6. Skin/Wound Care: Routine skin checks 7. Fluids/Electrolytes/Nutrition: Routine in and outs.  BMP within acceptable range on 5/26 8.  Hypertension.  Lopressor 12.5 mg twice daily, increased to 25 on 5/8.    Started Lisinopril 2.5mg  daily, increased to 5 on 5/19  Labile on 5/26, monitor in ambulatory setting, may need further adjustments.  Monitor with increased mobility 9.  Hyperlipidemia. Lipitor 10. Post stroke dysphagia.  Advance to regular diet thins with compensatory  strategies 11.  History of tobacco and alcohol use.  NicoDerm patch.    Counseled wife and patient 72.  Moderate hypoalbuminemia  Supplement initiated on 4/28 13.  WBCs: Resolved  WBCs within normal limits on 5/21  RA latex elevated, CMV elevated -discussed with ID, may consider serial checks as outpatient.  Cont to monitor 13.  AKI-resolved with IVF  Echocardiogram reviewed, EF ~60%  IVF DC'd on 5/17  Creatinine 0.84 on 5/17, labs pending 14.  Sepsis: Resolved  See #12  Remains afebrile  UA suspicious, urine culture multiple species; repeat UA suspicious again, urine culture insignificant growth  Blood cultures NG; repeat blood cultures no growth  Chest x-ray personally reviewed again, unremarkable for infection  Influenza negative  Covid negative  Empiric Septra d/ced, IV Vanco and cefepime DC'd  Procalcitonin within normal limits on 5/14  ID consulted-recommended CT abdomen/pelvis and chest-unremarkable for infection, + atelectasis.  ID signed off 15.  Prediabetes  Changed carb modified  Blood glucose mildly elevated on 5/25  > 30 minutes spent in total in discharge planning between myself and PA regarding aforementioned, as well  discussion regarding DME equipment, follow-up appointments, follow-up therapies, discharge medications, discharge recommendations  LOS: 29 days A FACE TO FACE EVALUATION WAS PERFORMED  Ankit Lorie Phenix 08/05/2019, 8:05 AM

## 2019-08-05 NOTE — Discharge Instructions (Signed)
Inpatient Rehab Discharge Instructions  Atom Herrera Discharge date and time: No discharge date for patient encounter.   Activities/Precautions/ Functional Status: Activity: activity as tolerated Diet:  Wound Care: none needed Functional status:  ___ No restrictions     ___ Walk up steps independently ___ 24/7 supervision/assistance   ___ Walk up steps with assistance ___ Intermittent supervision/assistance  ___ Bathe/dress independently ___ Walk with walker     _x__ Bathe/dress with assistance ___ Walk Independently    ___ Shower independently ___ Walk with assistance    ___ Shower with assistance ___ No alcohol     ___ Return to work/school ________ COMMUNITY REFERRALS UPON DISCHARGE:    Outpatient: PT     OT    ST                Agency: [facility] Phone: [phone]              Appointment Date/Time: May 27th 12:30PM  Medical Equipment/Items Ordered:Wheelchair, Tub Transfer Bench, Drop Arm Bedside Commode                                                 Agency/Supplier: [employer]   Special Instructions: No driving smoking or alcohol  Continue aspirin 325 mg daily and Plavix 75 mg daily x3 months then aspirin alone STROKE/TIA DISCHARGE INSTRUCTIONS SMOKING Cigarette smoking nearly doubles your risk of having a stroke & is the single most alterable risk factor  If you smoke or have smoked in the last 12 months, you are advised to quit smoking for your health.  Most of the excess cardiovascular risk related to smoking disappears within a year of stopping.  Ask you doctor about anti-smoking medications  [place] Quit Line: 1-800-QUIT NOW  Free Smoking Cessation Classes [phone]  CHOLESTEROL Know your levels; limit fat & cholesterol in your diet  Lipid Panel     Component Value Date/Time   CHOL 196 07/01/2019 0315   TRIG 52 07/01/2019 0315   HDL 77 07/01/2019 0315   CHOLHDL 2.5 07/01/2019 0315   VLDL 10 07/01/2019 0315   LDLCALC  109 (H) 07/01/2019 0315      Many patients benefit from treatment even if their cholesterol is at goal.  Goal: Total Cholesterol (CHOL) less than 160  Goal:  Triglycerides (TRIG) less than 150  Goal:  HDL greater than 40  Goal:  LDL (LDLCALC) less than 100   BLOOD PRESSURE American Stroke Association blood pressure target is less that 120/80 mm/Hg  Your discharge blood pressure is:  BP: (!) 141/116  Monitor your blood pressure  Limit your salt and alcohol intake  Many individuals will require more than one medication for high blood pressure  DIABETES (A1c is a blood sugar average for last 3 months) Goal HGBA1c is under 7% (HBGA1c is blood sugar average for last 3 months)  Diabetes: No known diagnosis of diabetes    Lab Results  Component Value Date   HGBA1C 6.2 (H) 07/01/2019     Your HGBA1c can be lowered with medications, healthy diet, and exercise.  Check your blood sugar as directed by your physician  Call your physician if you experience unexplained or low blood sugars.  PHYSICAL ACTIVITY/REHABILITATION Goal is 30 minutes at least 4 days per week  Activity: Increase activity slowly, Therapies: Physical Therapy: Home Health  Return to work:   Activity decreases your risk of heart attack and stroke and makes your heart stronger.  It helps control your weight and blood pressure; helps you relax and can improve your mood.  Participate in a regular exercise program.  Talk with your doctor about the best form of exercise for you (dancing, walking, swimming, cycling).  DIET/WEIGHT Goal is to maintain a healthy weight  Your discharge diet is:  Diet Order            DIET DYS 2 Room service appropriate? Yes with Assist; Fluid consistency: Thin  Diet effective now              liquids Your height is:    Your current weight is: Weight: 60.8 kg Your Body Mass Index (BMI) is:  BMI (Calculated): 19.23  Following the type of diet specifically designed for you will help  prevent another stroke.  Your goal weight range is:    Your goal Body Mass Index (BMI) is 19-24.  Healthy food habits can help reduce 3 risk factors for stroke:  High cholesterol, hypertension, and excess weight.  RESOURCES Stroke/Support Group:  Call [phone]   STROKE EDUCATION PROVIDED/REVIEWED AND GIVEN TO PATIENT Stroke warning signs and symptoms How to activate emergency medical system (call 911). Medications prescribed at discharge. Need for follow-up after discharge. Personal risk factors for stroke. Pneumonia vaccine given:  Flu vaccine given:  My questions have been answered, the writing is legible, and I understand these instructions.  I will adhere to these goals & educational materials that have been provided to me after my discharge from the hospital.      My questions have been answered and I understand these instructions. I will adhere to these goals and the provided educational materials after my discharge from the hospital.  Patient/Caregiver Signature _______________________________ Date __________  Clinician Signature _______________________________________ Date __________  Please bring this form and your medication list with you to all your follow-up doctor's appointments.

## 2019-08-06 ENCOUNTER — Other Ambulatory Visit: Payer: Self-pay

## 2019-08-06 ENCOUNTER — Ambulatory Visit: Payer: BC Managed Care – PPO | Admitting: Occupational Therapy

## 2019-08-06 ENCOUNTER — Ambulatory Visit: Payer: BC Managed Care – PPO | Attending: Physician Assistant

## 2019-08-06 ENCOUNTER — Encounter: Payer: Self-pay | Admitting: Occupational Therapy

## 2019-08-06 DIAGNOSIS — R278 Other lack of coordination: Secondary | ICD-10-CM | POA: Diagnosis present

## 2019-08-06 DIAGNOSIS — I69352 Hemiplegia and hemiparesis following cerebral infarction affecting left dominant side: Secondary | ICD-10-CM | POA: Diagnosis present

## 2019-08-06 DIAGNOSIS — R2681 Unsteadiness on feet: Secondary | ICD-10-CM | POA: Diagnosis present

## 2019-08-06 DIAGNOSIS — R41841 Cognitive communication deficit: Secondary | ICD-10-CM | POA: Diagnosis present

## 2019-08-06 DIAGNOSIS — R41840 Attention and concentration deficit: Secondary | ICD-10-CM | POA: Diagnosis present

## 2019-08-06 DIAGNOSIS — R209 Unspecified disturbances of skin sensation: Secondary | ICD-10-CM | POA: Insufficient documentation

## 2019-08-06 DIAGNOSIS — M6281 Muscle weakness (generalized): Secondary | ICD-10-CM | POA: Insufficient documentation

## 2019-08-06 DIAGNOSIS — R482 Apraxia: Secondary | ICD-10-CM

## 2019-08-06 DIAGNOSIS — R2689 Other abnormalities of gait and mobility: Secondary | ICD-10-CM

## 2019-08-06 DIAGNOSIS — R471 Dysarthria and anarthria: Secondary | ICD-10-CM | POA: Insufficient documentation

## 2019-08-06 DIAGNOSIS — I639 Cerebral infarction, unspecified: Secondary | ICD-10-CM | POA: Diagnosis present

## 2019-08-06 DIAGNOSIS — R208 Other disturbances of skin sensation: Secondary | ICD-10-CM

## 2019-08-06 DIAGNOSIS — I6381 Other cerebral infarction due to occlusion or stenosis of small artery: Secondary | ICD-10-CM

## 2019-08-06 NOTE — Therapy (Signed)
[facility] [address] [place], [name], [address] Phone: [phone]   Fax:  [phone]  Physical Therapy Evaluation  Patient Details  Name: Russell Russell MRN: [id number] Date of Birth: 11-25-1950 Referring Provider (PT): Lauraine Rinne, PA-C   Encounter Date: 08/06/2019  PT End of Session - 08/06/19 1904    Visit Number  1    Number of Visits  17    Date for PT Re-Evaluation  11/04/19   POC for 8 weeks, Cert for 90 days   Authorization Type  [employer] (follow medicare guidelines, 10th visit PN)    Progress Note Due on Visit  10    PT Start Time  1234    PT Stop Time  1316    PT Time Calculation (min)  42 min    Equipment Utilized During Treatment  Gait belt    Activity Tolerance  Patient tolerated treatment well    Behavior During Therapy  WFL for tasks assessed/performed       Past Medical History:  Diagnosis Date  . Alcohol abuse    drinks 1 gallon of Gin every weekend, nothing during the week x >15 yrs  . Elevated transaminase level    +elev bili: abd u/s 06/2014 showed stable small hepatic hemangiomas, o/w normal.  . Essential hypertension   . GERD (gastroesophageal reflux disease)   . Hyperlipidemia 03/2014   Atorv started-chol improved  . Impaired fasting glucose 03/2014  . Nephrolithiasis   . PUD (peptic ulcer disease)   . Tobacco dependence    chantix: "psych effects"    Past Surgical History:  Procedure Laterality Date  . COLONOSCOPY  2005   Recall 10 yrs ([place])  . LOOP RECORDER INSERTION N/A 07/03/2019   Procedure: LOOP RECORDER INSERTION;  Surgeon: Thompson Grayer, MD;  Location: [facility];  Service: Cardiovascular;  Laterality: N/A;  . removal of kidney stones  2006   cystoscopic--removed from ureter.  No prob since.    There were no vitals filed for this visit.   Subjective Assessment - 08/06/19 1855    Subjective  Patient and wife reporting that on 06/30/19, he was  admitted to hospital when he was found down at home. Had a multiple week hospitalization between acute and inpatient rehab. Patient reporting that he was discharged home from inpatient rehab yesterday. Reports no issues at home yesterday evening. Has been using w/c at home but is only able to get it through front door. Currently using bedside commode and not transferring to toilet currently. Reports that he feels good, no pain, but does have weakness on the L side including his lower leg and arm. No headaches.    Patient is accompained by:  Family member   Wife   Pertinent History  PMH: HTN, alcohol abuse (ongoing use), HLD, GERD, PUD, tobacco use    Limitations  Standing;Walking;House hold activities    Patient Stated Goals  return to prior level of function, walk, "wheelchair is not apart of me" (wants to get out of the w/c)    Currently in Pain?  No/denies         [facility] PT Assessment - 08/06/19 1906      Assessment   Medical Diagnosis  R Basal Ganglia CVA    Referring Provider (PT)  Lauraine Rinne, PA-C    Onset Date/Surgical Date  06/30/19   date of CVA   Hand Dominance  Right    Prior Therapy  IP Rehab  Precautions   Precautions  Fall    Precaution Comments  Loop recorder      Restrictions   Weight Bearing Restrictions  No      Balance Screen   Has the patient fallen in the past 6 months  No    Has the patient had a decrease in activity level because of a fear of falling?   No    Is the patient reluctant to leave their home because of a fear of falling?   No      Home Social worker  Private residence    Living Arrangements  Spouse/significant other    Available Help at Discharge  Family    Type of [place] to enter    Entrance Stairs-Number of Steps  1    [place]  One level    [place]  Bedside commode;Shower seat;Wheelchair - manual    Additional Comments  Wife reporting that they are currently in process of  building/moving to new home and she is getting ramp.       Prior Function   Level of Independence  Independent    Vocation  Retired    Leisure  likes to relax, visit with family      Sensation   Light Touch  Appears Intact    Additional Comments  patient appears to have intact BLE sensation, no reports of altered sensation noted      Coordination   Gross Motor Movements are Fluid and Coordinated  No    Coordination and Movement Description  gross uncoordination due L pushing, generalized weakness    Heel Shin Test  dififculty performing due to weakness      Posture/Postural Control   Posture/Postural Control  Postural limitations    Postural Limitations  Rounded Shoulders;Forward head;Increased thoracic kyphosis;Posterior pelvic tilt;Weight shift left      Tone   Assessment Location  Left Lower Extremity;Right Lower Extremity      ROM / Strength   AROM / PROM / Strength  Strength;AROM      AROM   Overall AROM   Within functional limits for tasks performed    Overall AROM Comments  for BLE's      Strength   Overall Strength  Deficits    Strength Assessment Site  Hip;Knee;Ankle    Right/Left Hip  Right;Left    Right Hip Flexion  4+/5    Right Hip ABduction  4+/5    Right Hip ADduction  4+/5    Left Hip Flexion  3/5    Left Hip Extension  3+/5    Left Hip ABduction  3+/5    Left Hip ADduction  3+/5    Right/Left Knee  Right;Left    Right Knee Flexion  5/5    Right Knee Extension  3/5    Left Knee Flexion  3/5    Left Knee Extension  3-/5    Right/Left Ankle  Right;Left    Right Ankle Dorsiflexion  4+/5    Left Ankle Dorsiflexion  3-/5      Bed Mobility   Bed Mobility  Rolling Right;Rolling Left;Supine to Sit;Sit to Supine;Scooting to [facility]    Rolling Right  Supervision/verbal cueing    Rolling Left  Supervision/Verbal cueing    Supine to Sit  Supervision/Verbal cueing    Sit to Supine  Supervision/Verbal cueing    Scooting to [facility]  Supervision/Verbal Cueing  Transfers   Transfers  Sit to Stand;Stand to [employer] Transfers    Sit to Stand  3: Mod assist    Sit to Stand Details  Tactile cues for weight shifting;Tactile cues for posture;Verbal cues for technique;Verbal cues for sequencing;Manual facilitation for weight shifting    Sit to Stand Details (indicate cue type and reason)  completed sit <> stand w/o AD and Mod A. Attempted sit <> stand w/ RW and require min A for completion, however increased verbal cues and tactiles cues for hand placement and sequencing. As well as verbal cues for improved forward lean to initiate.    Stand to Sit  4: Min assist    Stand to Sit Details (indicate cue type and reason)  Tactile cues for sequencing;Tactile cues for weight shifting;Verbal cues for technique    Stand to Sit Details  decreased control with descent, with RW patient requiring verbal cues for hand placement to ensure surface is behind prior to descent    Stand Pivot Transfers  3: Mod assist    Comments  Wife demonstrating how they are completing stand pivot transfer at home. PT providing encouragement for patient to use BUE to push from mat rather than hooking around wife's neck to promote safety. PT completed <> stand transfer to w/c from mat, patietn requiring Mod A. Attempted with RW, patient demo pushing to L side. PT providing faciliation to advance LLE and verbal cues for proper sequencing for SPT, however patient demoing difficulty and unable to complete.       Ambulation/Gait   Ambulation/Gait  --    Ambulation/Gait Assistance  --      Balance   Balance Assessed  Yes      Static Sitting Balance   Static Sitting - Balance Support  Feet supported;No upper extremity supported    Static Sitting - Comment/# of Minutes  seated edge of mat x 5 minutes w/ intermittent UE support from mat.      Static Standing Balance   Static Standing - Balance Support  Bilateral upper extremity supported    Static Standing - Level of Assistance  4: Min  assist;3: Mod assist   decreased endurance with static standing     RLE Tone   RLE Tone  Within Functional Limits      LLE Tone   LLE Tone  Mild;Hypertonic                  Objective measurements completed on examination: See above findings.              PT Education - 08/06/19 1902    Education Details  Educated on Evaluation findings and POC    Person(s) Educated  Patient;Spouse    Methods  Explanation    Comprehension  Verbalized understanding       PT Short Term Goals - 08/06/19 2054      PT SHORT TERM GOAL #1   Title  Patient will be independent with Initial HEP, with wifes assistance    Time  4    Period  Weeks    Status  New    Target Date  09/03/19      PT SHORT TERM GOAL #2   Title  Patient will demonstrate ability to complete stand static w/ LRAD and midline alignment x 4 minutes w/o LOB to demonstrate improved tolerance for ADL tasks    Baseline  <1 min w/ RW    Time  4    Period  Weeks    Status  New    Target Date  09/03/19      PT SHORT TERM GOAL #3   Title  Patient will demo ability to complete stand pivot transfer w/ LRAD and Min A for improved functional mobility    Baseline  Mod A required    Time  4    Period  Weeks    Status  New    Target Date  09/03/19      PT SHORT TERM GOAL #4   Title  Patient will demo ability to ambulate 25 feet w/ LRAD and Min A for improved mobility    Baseline  not yet assessed    Time  4    Period  Weeks    Status  New    Target Date  09/03/19        PT Long Term Goals - 08/06/19 2057      PT LONG TERM GOAL #1   Title  Patient will be independent with Final HEP, with wifes assistance    Time  8    Period  Weeks    Status  New    Target Date  10/01/19      PT LONG TERM GOAL #2   Title  Patient will demonstrate ability to complete transfer from w/c <> mat w/ CGA to demo improved independence with transfers.    Baseline  Mod A    Time  8    Period  Weeks    Status  New    Target  Date  10/01/19      PT LONG TERM GOAL #3   Title  Patient will demonstrate ability to ambulate 50 ft w/ LRAD and min A to demonstrate improved household mobility    Time  8    Period  Weeks    Status  New    Target Date  10/01/19      PT LONG TERM GOAL #4   Title  Patient will demonstrate ability to complete sit <> stand Mod I to demonstrate improved functional mobility    Time  8    Period  Weeks    Status  New    Target Date  10/01/19             Plan - 08/06/19 2104    Clinical Impression Statement  Pt is 69 y/o male referred to Neuro OPPT post right basal ganglia CBA that occurred on 06/30/2019. Patients PMH is significant for: HTN, alcohol abuse (ongoing use), HLD, GERD, PUD, and tobacco use. Pt presents with hemiparesis in LUE/LLE. Currently patient is utilizing w/c as primary means of household and community mobility. Patient is Min A for sit to stand, Mod A for stand pivot transfer, and supv/CGA for bed mobility. Patient will benefit from skilled PT services to address strength, balance, and functional mobility deficits.    Personal Factors and Comorbidities  Comorbidity 3+    Comorbidities  HTN, alcohol abuse (ongoing use), HLD, GERD, PUD, tobacco use    Examination-Activity Limitations  Bed Mobility;Dressing;Locomotion Level;Stairs;Stand;Toileting;Transfers;Bathing    Examination-Participation Restrictions  Driving;Interpersonal Relationship;Yard Work    [employer]  Evolving/Moderate complexity    Clinical Decision Making  Moderate    Rehab Potential  Good    PT Frequency  2x / week    PT Duration  8 weeks    PT Treatment/Interventions  ADLs/Self Care Home Management;Electrical Stimulation;DME Instruction;Moist Heat;Cryotherapy;Gait training;Stair training;Functional mobility training;Therapeutic activities;Therapeutic exercise;Balance training;Neuromuscular re-education;Patient/family education;Orthotic Fit/Training;Wheelchair  mobility  training;Passive range of motion    PT Next Visit Plan  Assess gait in parallel bars, transfer training, work on midline (with mirror) due to patietns tendency to push to L side    Consulted and Agree with Plan of Care  Patient;Family member/caregiver    Family Member Consulted  Wife       Patient will benefit from skilled therapeutic intervention in order to improve the following deficits and impairments:  Abnormal gait, Decreased coordination, Difficulty walking, Impaired tone, Decreased safety awareness, Decreased endurance, Decreased activity tolerance, Pain, Decreased balance, Decreased knowledge of use of DME, Postural dysfunction, Impaired sensation, Decreased strength, Decreased mobility  Visit Diagnosis: Cerebrovascular accident (CVA) of right basal ganglia (HCC)  Muscle weakness (generalized)  Other abnormalities of gait and mobility  Unsteadiness on feet     Problem List Patient Active Problem List   Diagnosis Date Noted  . AKI (acute kidney injury) ([place])   . Fever   . Sepsis without acute organ dysfunction ([place])   . Elevated BUN   . Blood pressure increase diastolic   . Labile blood pressure   . Leukopenia   . Benign essential HTN   . Hypoalbuminemia   . Cerebrovascular accident (CVA) of right basal ganglia ([place]) 07/07/2019  . Acute ischemic stroke ([place])   . Alcohol abuse   . Dyslipidemia   . Dysphagia, post-stroke   . Polycythemia   . Stroke ([place]) 06/30/2019  . Prediabetes 12/23/2016  . Elevated LFTs 12/23/2016  . Pure hypercholesterolemia 12/21/2016  . Alcoholism ([place]) 12/21/2016  . Eczema 09/18/2013  . Essential hypertension 09/18/2013  . Gastroesophageal reflux disease without esophagitis 09/18/2013  . Tobacco abuse 09/18/2013    Jones Bales, PT,DPT 08/06/2019, 9:16 PM  [facility] [address] [place], [name], [address] Phone: [phone]   Fax:  [phone]  Name: Russell Russell MRN: [id number] Date of Birth: 1950/05/26

## 2019-08-06 NOTE — Therapy (Signed)
[facility] [address] [place], [name], [address] Phone: [phone]   Fax:  [phone]  Occupational Therapy Evaluation  Patient Details  Name: Russell Russell MRN: [id number] Date of Birth: [birth year] Referring Provider (OT): [name]   Encounter Date: 08/06/2019  OT End of Session - 08/06/19 1831    Visit Number  1    Number of Visits  17    Authorization Type  BCBS? "Follow [employer] Guidelines"    Progress Note Due on Visit  10    OT Start Time  1315    OT Stop Time  1400    OT Time Calculation (min)  45 min    Activity Tolerance  Patient tolerated treatment well       Past Medical History:  Diagnosis Date  . Alcohol abuse    drinks 1 gallon of Gin every weekend, nothing during the week x >15 yrs  . Elevated transaminase level    +elev bili: abd u/s 06/2014 showed stable small hepatic hemangiomas, o/w normal.  . Essential hypertension   . GERD (gastroesophageal reflux disease)   . Hyperlipidemia 03/2014   Atorv started-chol improved  . Impaired fasting glucose 03/2014  . Nephrolithiasis   . PUD (peptic ulcer disease)   . Tobacco dependence    chantix: "psych effects"    Past Surgical History:  Procedure Laterality Date  . COLONOSCOPY  2005   Recall 10 yrs ([place])  . LOOP RECORDER INSERTION N/A 07/03/2019   Procedure: LOOP RECORDER INSERTION;  Surgeon: Thompson Grayer, MD;  Location: [facility];  Service: Cardiovascular;  Laterality: N/A;  . removal of kidney stones  2006   cystoscopic--removed from ureter.  No prob since.    There were no vitals filed for this visit.  Subjective Assessment - 08/06/19 1615    Subjective   I want to get out of this wheelchair    Patient is accompanied by:  Family member    Pertinent History  HTN, ETOH use, smoker, loop recorder    Currently in Pain?  No/denies    Pain Score  0-No pain        [facility] OT Assessment - 08/06/19 0001      Assessment    Medical Diagnosis  R BG CVA    Referring Provider (OT)  Linna Hoff Angiulli    Onset Date/Surgical Date  07/07/19    Hand Dominance  Right    Prior Therapy  IP rehab released yesterday      Precautions   Precautions  Fall    Precaution Comments  Loop recorder      Prior Function   Level of Independence  Independent with basic ADLs    Vocation  Retired   In October   Vocation Requirements  Recently retired, drinking heavilly per wife    Leisure  Visiting family, friends      ADL   Eating/Feeding  Moderate assistance    Grooming  Moderate assistance    Upper Body Bathing  Minimal assistance    Lower Body Bathing  Maximal assistance    Upper Body Dressing  Minimal assistance    Lower Body Dressing  Moderate assistance    Toilet Transfer  Moderate assistance    Toileting - Clothing Manipulation  Moderate assistance    Where Assessed - Toileting Clothing Manipulationn  Moderate assistance    Tub/Shower Transfer  --   Unable   ADL comments  Sponge bathing currently, unable to get wheelchair  in bathroom      Written Expression   Dominant Hand  Right      Vision Assessment   Eye Alignment  Within Functional Limits    Comment  May need to further assess in functional context      Activity Tolerance   Activity Tolerance  Tolerates 10-20 min activity with multiple rests      Cognition   Overall Cognitive Status  Impaired/Different from baseline    Area of Impairment  Attention;Following commands;Memory;Orientation;Safety/judgement;Awareness    Orientation Level  Time    Attention Comments  able to sustain attention x 30 sec    Memory  Decreased short-term memory    Following Commands  Follows one step commands inconsistently    Following Command Comments  moves quickly    Safety/Judgement  Decreased awareness of deficits    Awareness  Intellectual    Cognition Comments  Patient with decreased voice volume, difficult to undersatnd all of his verbal responses - worse with mask over  mouth.  Patient's wife reports he thinks he is still working, and he discussed getting his arm to work so he can manage the parts (like at workplace)        Posture/Postural Control   Posture/Postural Control  Postural limitations    Postural Limitations  Rounded Shoulders;Forward head;Increased thoracic kyphosis;Posterior pelvic tilt;Weight shift left      Sensation   Light Touch  Impaired by gross assessment    Stereognosis  Not tested    Hot/Cold  Not tested    Proprioception  Impaired by gross assessment      Coordination   Gross Motor Movements are Fluid and Coordinated  No    Fine Motor Movements are Fluid and Coordinated  No    Finger Nose Finger Test  apraxia - uanble to perform with LUE    9 Hole Peg Test  Right;Left    Right 9 Hole Peg Test  37 sec    Left 9 Hole Peg Test  unable    Box and Blocks  2 - RUE      Perception   Perception  Impaired      Praxis   Praxis  Impaired    Praxis Impairment Details  Motor planning      Tone   Assessment Location  Left Upper Extremity      ROM / Strength   AROM / PROM / Strength  AROM;Strength      AROM   Overall AROM   Deficits    AROM Assessment Site  Shoulder    Right/Left Shoulder  Left    Left Shoulder Flexion  110 Degrees      Strength   Overall Strength  Deficits    Overall Strength Comments  Not formally tested in LUE due to hypertonicity      Hand Function   Right Hand Gross Grasp  Functional    Right Hand Grip (lbs)  57.9    Right Hand Lateral Pinch  20 lbs    Left Hand Gross Grasp  Impaired    Left Hand Grip (lbs)  30.4    Left Hand Lateral Pinch  14 lbs      LUE Tone   LUE Tone  Hypertonic;Mild      LUE Tone   Hypertonic Details  elbow, forearm, wrist, digits                      OT Education - 08/06/19 1831  Education Details  Eval findings and potential plan of care for OT    Person(s) Educated  Patient;Spouse    Methods  Explanation    Comprehension  Verbalized  understanding;Need further instruction       OT Short Term Goals - 08/06/19 1839      OT SHORT TERM GOAL #1   Title  Patient will compelte a home exercise program designed to improve functional use of LUE with mod cueing and set up due 09/05/19    Time  4    Period  Weeks    Status  New    [place] Date  09/05/19      OT SHORT TERM GOAL #2   Title  Patient will don a front opening shirt with fasteners (buttons/zipper) with mod assist    Time  4    Period  Weeks    Status  New      OT SHORT TERM GOAL #3   Title  Patient will demonstrate ability to cut food on plate with min assist    Time  4    Period  Weeks    Status  New      OT SHORT TERM GOAL #4   Title  Patient will demonstrate low reach, grasp, release of lightweight small object (less than 1 lb) with min assist    Time  4    Period  Weeks    Status  New        OT Long Term Goals - 08/06/19 1842      OT LONG TERM GOAL #1   Title  Patient will complete an updated HEP to address LUE functioning due 10/05/19    Time  8    Period  Weeks    Status  New    Target Date  10/05/19      OT LONG TERM GOAL #2   Title  Patient will walk with assistive device into bathroom to use toilet with min assist    Time  8    Period  Weeks    Status  New      OT LONG TERM GOAL #3   Title  Patient will transfer onto shower seat with min assist    Time  8    Period  Weeks    Status  New      OT LONG TERM GOAL #4   Title  Patient will obtain and release a 2-3 lb object at chest height shelf using LUE and min assist    Time  8    Period  Weeks    Status  New            Plan - 08/06/19 1832    Clinical Impression Statement  Patient is a 68 year old male just discharged yesterday from inpatient rehab after suffering a stroke (scattering of infarcts BG/cerebellum) Patient is recently retired, and wife reports that he was depressed with the pandemic, and drinking too heavilly at home during the day.  Patient presents to OT  evaluation with left hemiplegia, apraxia, significant perceptual deficits, decreased attention, awareness, memory, decreased sensation LUE all of which impede his ability to perform even basic self care skills.  Patient is currently using wheelchair for all mobility, and wheelchair does not fit in either his bedroom or bathroom, so patient is currently sleeping on the couch with bedside commode nearby, and taking sponge baths.  Patient will benefit from skilled OT intervention to increase independence and safety with  ADL and functional mobility.    OT Occupational Profile and History  Detailed Assessment- Review of Records and additional review of physical, cognitive, psychosocial history related to current functional performance    Occupational performance deficits (Please refer to evaluation for details):  ADL's;IADL's;Rest and Sleep;Leisure    Body Structure / Function / Physical Skills  ADL;Coordination;Endurance;GMC;UE functional use;Sensation;Decreased knowledge of precautions;Balance;Body mechanics;Decreased knowledge of use of DME;Flexibility;IADL;Pain;Vision;Cardiopulmonary status limiting activity;Dexterity;FMC;Proprioception;Strength;Tone;ROM    Cognitive Skills  Attention;Orientation;Thought;Perception;Problem Solve;Safety Awareness;Sequencing;Memory;Learn    Psychosocial Skills  Habits;Routines and Behaviors    Rehab Potential  Good    Clinical Decision Making  Several treatment options, min-mod task modification necessary    Comorbidities Affecting Occupational Performance:  May have comorbidities impacting occupational performance    Modification or Assistance to Complete Evaluation   Min-Moderate modification of tasks or assist with assess necessary to complete eval    OT Frequency  2x / week    OT Duration  8 weeks    OT Treatment/Interventions  Self-care/ADL training;Electrical Stimulation;Therapeutic exercise;Visual/perceptual remediation/compensation;Patient/family  education;Splinting;Neuromuscular education;Moist Heat;Aquatic Therapy;Balance training;Therapeutic activities;Functional Mobility Training;Fluidtherapy;DME and/or AE instruction;Manual Therapy;Cognitive remediation/compensation    Plan  Functional use of LUE, NMR postural control, assess IP HEP,  or initiate HEP    Consulted and Agree with Plan of Care  Patient;Family member/caregiver    Family Member Consulted  Wife, [name]       Patient will benefit from skilled therapeutic intervention in order to improve the following deficits and impairments:   Body Structure / Function / Physical Skills: ADL, Coordination, Endurance, GMC, UE functional use, Sensation, Decreased knowledge of precautions, Balance, Body mechanics, Decreased knowledge of use of DME, Flexibility, IADL, Pain, Vision, Cardiopulmonary status limiting activity, Dexterity, FMC, Proprioception, Strength, Tone, ROM Cognitive Skills: Attention, Orientation, Thought, Perception, Problem Solve, Safety Awareness, Sequencing, Memory, Learn Psychosocial Skills: Habits, Routines and Behaviors   Visit Diagnosis: Hemiplegia and hemiparesis following cerebral infarction affecting left dominant side (HCC) - Plan: Ot plan of care cert/re-cert  Apraxia - Plan: Ot plan of care cert/re-cert  Attention and concentration deficit - Plan: Ot plan of care cert/re-cert  Other disturbances of skin sensation - Plan: Ot plan of care cert/re-cert  Other lack of coordination - Plan: Ot plan of care cert/re-cert  Unsteadiness on feet - Plan: Ot plan of care cert/re-cert  Muscle weakness (generalized) - Plan: Ot plan of care cert/re-cert    Problem List Patient Active Problem List   Diagnosis Date Noted  . AKI (acute kidney injury) ([place])   . Fever   . Sepsis without acute organ dysfunction ([place])   . Elevated BUN   . Blood pressure increase diastolic   . Labile blood pressure   . Leukopenia   . Benign essential HTN   . Hypoalbuminemia   .  Cerebrovascular accident (CVA) of right basal ganglia ([facility]) 07/07/2019  . Acute ischemic stroke ([place])   . Alcohol abuse   . Dyslipidemia   . Dysphagia, post-stroke   . Polycythemia   . Stroke ([facility]) 06/30/2019  . Prediabetes 12/23/2016  . Elevated LFTs 12/23/2016  . Pure hypercholesterolemia 12/21/2016  . Alcoholism ([place]) 12/21/2016  . Eczema 09/18/2013  . Essential hypertension 09/18/2013  . Gastroesophageal reflux disease without esophagitis 09/18/2013  . Tobacco abuse 09/18/2013    Mariah Milling , OTR/L 08/06/2019, 6:48 PM  [facility] [address] [place], [name], [address] Phone: [phone]   Fax:  [phone]  Name: Hodge Shiffler MRN: [id number] Date of Birth: Jul 09, 1950

## 2019-08-07 ENCOUNTER — Ambulatory Visit: Payer: BC Managed Care – PPO

## 2019-08-07 DIAGNOSIS — R41841 Cognitive communication deficit: Secondary | ICD-10-CM

## 2019-08-07 DIAGNOSIS — R471 Dysarthria and anarthria: Secondary | ICD-10-CM

## 2019-08-07 DIAGNOSIS — I639 Cerebral infarction, unspecified: Secondary | ICD-10-CM | POA: Diagnosis not present

## 2019-08-07 NOTE — Therapy (Signed)
[name] [address] [place], [name], [address] Phone: [phone]   Fax:  [phone]  Speech Language Pathology Treatment  Patient Details  Name: Russell Russell MRN: [id number] Date of Birth: 08-29-1950 Referring Provider (SLP): Stann Mainland, [place]   Encounter Date: 08/07/2019  End of Session - 08/07/19 1746    Visit Number  1    Number of Visits  17    Date for SLP Re-Evaluation  11/05/19    SLP Start Time  1150    SLP Stop Time   1230    SLP Time Calculation (min)  40 min    Activity Tolerance  Patient tolerated treatment well       Past Medical History:  Diagnosis Date  . Alcohol abuse    drinks 1 gallon of Gin every weekend, nothing during the week x >15 yrs  . Elevated transaminase level    +elev bili: abd u/s 06/2014 showed stable small hepatic hemangiomas, o/w normal.  . Essential hypertension   . GERD (gastroesophageal reflux disease)   . Hyperlipidemia 03/2014   Atorv started-chol improved  . Impaired fasting glucose 03/2014  . Nephrolithiasis   . PUD (peptic ulcer disease)   . Tobacco dependence    chantix: "psych effects"    Past Surgical History:  Procedure Laterality Date  . COLONOSCOPY  2005   Recall 10 yrs ([place])  . LOOP RECORDER INSERTION N/A 07/03/2019   Procedure: LOOP RECORDER INSERTION;  Surgeon: Thompson Grayer, MD;  Location: [facility];  Service: Cardiovascular;  Laterality: N/A;  . removal of kidney stones  2006   cystoscopic--removed from ureter.  No prob since.    There were no vitals filed for this visit.  Subjective Assessment - 08/07/19 1155    Subjective  "They had some pictures and they asked me what were they - stuff like that." (pt, re: describing ST at hospital)    Patient is accompained by:  Family member   wife - wanda   Currently in Pain?  No/denies        SLP Evaluation [facility] - 08/07/19 1155      SLP Visit Information   SLP Received On   08/07/19    Referring Provider (SLP)  Stann Mainland, [place]    Onset Date  06-30-19    Medical Diagnosis  Rt CVA      Subjective   Patient/Family Stated Goal  "get stronger"      General Information   HPI  06-30-19 pt reported lt sided weakness. MRI multifocal rt ischemi infarcts in posterior basal ganglia, rt cerebral hemisphere, and rt cerebellum. PMH: alcohol/tobacco abuse, hyperlipidemia.       Prior Functional Status   Cognitive/Linguistic Baseline  Within functional limits    Type of Home  House     Lives With  Spouse    Vocation  Retired      Associate Professor   Overall Cognitive Status  Impaired/Different from baseline    Area of Impairment  Awareness;Attention;Memory;Orientation    Orientation Level  Time    General Comments  pt told SLP his CVA was about 3 weeks ago however it was last month    Current Attention Level  Selective    Attention Comments  suspect pt is internally distracted; attention to detail is poor on eval tasks    Memory Comments  suspect memory deficits may be because of reduced attention. pt would benefit from memory compensations/planner/organizer    Awareness  Intellectual;Emergent    Awareness Comments  Answered "nothing really" when asked what is different following the CVA - SLP asked pt in 2 different ways and rec'd same answer from pt. Once SLP showed pt errors/was in a wheelchair he agreed he had difficulty with clock drawing, and LUE/LLE weakness. Pt without error awareness of eval tasks.       Auditory Comprehension   Overall Auditory Comprehension  Appears within functional limits for tasks assessed      Verbal Expression   Overall Verbal Expression  Appears within functional limits for tasks assessed      Oral Motor/Sensory Function   Overall Oral Motor/Sensory Function  Appears within functional limits for tasks assessed      Motor Speech   Overall Motor Speech  Impaired    Respiration  Impaired    Level of Impairment  Sentence    Phonation  Low  vocal intensity   pt has EMST device - will bring next session   Resonance  Within functional limits    Articulation  Within functional limitis    Intelligibility  Intelligibility reduced    Sentence  75-100% accurate    Conversation  75-100% accurate           SLP Education - 08/07/19 1745    Education Details  Deficit areas, possible goals    Person(s) Educated  Patient    Methods  Explanation    Comprehension  Verbalized understanding;Need further instruction       SLP Short Term Goals - 08/07/19 1751      SLP SHORT TERM GOAL #1   Title  pt will demo the knowledge that he can use a memory system for daily events, ID'ing prospective and past events, medication management, and appoitmetn tracking, etc x 3 sessions    Time  4    Period  Weeks   or 9 total visits, for all STGs   Status  New      SLP SHORT TERM GOAL #2   Title  pt will demo selective atteniton for 5 mintues to perform simple cognitive lingiustic tasks in a min-mod noisy environment x2 sessions    Time  4    Period  Weeks    Status  New      SLP SHORT TERM GOAL #3   Title  pt will tell SLP 2 congitive deficits affecting his life at home post-CVA in two sessions    Time  4    Period  Weeks    Status  New      SLP SHORT TERM GOAL #4   Title  pt will incr expiratory muscle strength taken in first 2 ST sessions via spirometer    Time  4    Period  Weeks    Status  New       SLP Long Term Goals - 08/07/19 1757      SLP LONG TERM GOAL #1   Title  pt will demo simple conversation of 5 mintues with average mid 60s dB x 3 sessions    Time  8    Period  Weeks   or 17 sessions   Status  New      SLP LONG TERM GOAL #2   Title  pt will demo selective atteniton for 10 mintues to perform simple cognitive lingiustic tasks in a min-mod noisy environment x3 sessions    Time  8    Period  Weeks    Status  New  SLP LONG TERM GOAL #3   Title  pt will demo WFL alternating attention in simple cognitve  linguistic tasks functional for pt in 3 sessions    Time  8    Period  Weeks    Status  New      SLP LONG TERM GOAL #4   Title  pt will independnently access memory notebook/planner for appoitnment management, to-do-lists, recall of prospective and past events, etc in 3 sessions    Time  [place] - 08/07/19 1748    Clinical Impression Statement  Pt presents today with significant cognitive communication deficits - Cogntive Linguistic Quick Test was initiated today and will be completed next session or two sessions. Pt's deficits in these areas will affect his ability to be independent and at their current level will require consistent assistance from wife/family for most needs. Pt also presents with reduced voice volume (low 60s dB most of the time), affecting intelligibility with wife at home. SLP recommends skilled ST to address cognitive linguistics and dysarthria c/b reduced voice volume.    Speech Therapy Frequency  2x / week    Duration  --   8 weeks or 17 total visits   Treatment/Interventions  Environmental controls;Other (comment);Compensatory techniques;Functional tasks;SLP instruction and feedback;Cueing hierarchy;Cognitive reorganization;Patient/family education;Internal/external aids   EMST   Potential to Achieve Goals  Fair    Potential Considerations  Severity of impairments;Ability to learn/carryover information    Consulted and Agree with Plan of Care  Patient;Family member/caregiver    Family Member Consulted  wife       Patient will benefit from skilled therapeutic intervention in order to improve the following deficits and impairments:   Cognitive communication deficit  Dysarthria and anarthria    Problem List Patient Active Problem List   Diagnosis Date Noted  . AKI (acute kidney injury) ([place])   . Fever   . Sepsis without acute organ dysfunction ([place])   . Elevated BUN   . Blood pressure increase diastolic   . Labile  blood pressure   . Leukopenia   . Benign essential HTN   . Hypoalbuminemia   . Cerebrovascular accident (CVA) of right basal ganglia ([place]) 07/07/2019  . Acute ischemic stroke ([place])   . Alcohol abuse   . Dyslipidemia   . Dysphagia, post-stroke   . Polycythemia   . Stroke ([place]) 06/30/2019  . Prediabetes 12/23/2016  . Elevated LFTs 12/23/2016  . Pure hypercholesterolemia 12/21/2016  . Alcoholism ([place]) 12/21/2016  . Eczema 09/18/2013  . Essential hypertension 09/18/2013  . Gastroesophageal reflux disease without esophagitis 09/18/2013  . Tobacco abuse 09/18/2013    [facility] ,[place], [place]  08/07/2019, 6:02 PM  [facility] [address] [place], [name], [address] Phone: [phone]   Fax:  [phone]   Name: Russell Russell MRN: [id number] Date of Birth: [birth year]

## 2019-08-07 NOTE — Patient Instructions (Signed)
  Please bring your blowing device next session.

## 2019-08-13 ENCOUNTER — Ambulatory Visit: Payer: BC Managed Care – PPO | Admitting: Speech Pathology

## 2019-08-13 ENCOUNTER — Other Ambulatory Visit: Payer: Self-pay

## 2019-08-13 ENCOUNTER — Ambulatory Visit: Payer: BC Managed Care – PPO | Attending: Physician Assistant

## 2019-08-13 DIAGNOSIS — M6281 Muscle weakness (generalized): Secondary | ICD-10-CM | POA: Diagnosis present

## 2019-08-13 DIAGNOSIS — R471 Dysarthria and anarthria: Secondary | ICD-10-CM | POA: Diagnosis present

## 2019-08-13 DIAGNOSIS — R278 Other lack of coordination: Secondary | ICD-10-CM | POA: Diagnosis present

## 2019-08-13 DIAGNOSIS — R41841 Cognitive communication deficit: Secondary | ICD-10-CM | POA: Insufficient documentation

## 2019-08-13 DIAGNOSIS — R2681 Unsteadiness on feet: Secondary | ICD-10-CM

## 2019-08-13 DIAGNOSIS — I69352 Hemiplegia and hemiparesis following cerebral infarction affecting left dominant side: Secondary | ICD-10-CM | POA: Diagnosis present

## 2019-08-13 DIAGNOSIS — I639 Cerebral infarction, unspecified: Secondary | ICD-10-CM | POA: Insufficient documentation

## 2019-08-13 DIAGNOSIS — I6381 Other cerebral infarction due to occlusion or stenosis of small artery: Secondary | ICD-10-CM

## 2019-08-13 DIAGNOSIS — R2689 Other abnormalities of gait and mobility: Secondary | ICD-10-CM | POA: Insufficient documentation

## 2019-08-13 NOTE — Patient Instructions (Signed)
Breathing device exercises  1. Do 5 sets of 5 breaths. Count your repetitions with your fingers. Take a 30-60 second break between each set. (25 total repetitions) 2. Swallow before you start. Remember to get a full breath before each burst of air.  3. Hard and Fast! The sound from the device should be a quick burst of air.   Speech exercises: do this twice a day Practice these OUT LOUD. Get a full breath before you say it. Use your exhale to help you be loud. Use effort 7 out of 10.   Where's my ice cream? Can I have an oatmeal cake? Where's my Ensure? What's for dinner? I want one cigarette. Cover me up. Help me put my shoes on. Cut the TV up.   Focusing tips - To help you focus on conversations, your wife can ask you to mute or turn of the TV for a few minutes so that you can pay attention to the conversation. -It might help you to repeat back what you heard, to help you remember. For example, your wife tells you, "we're leaving for therapy in an hour," and you say, "Ok, so we are going to therapy at 9:00"   Cognitive Activities you can do at home:   - [place] (easy level)  - [place]  On your computer, tablet or phone: BrainHQ Brainbashers.com Neuronation App [employer] Game App [employer] IQ Logic Pict[employer]ord Sort it out (easy) Photo Quiz  - what's the word Mix 2 Words Spot the difference games

## 2019-08-13 NOTE — Therapy (Signed)
[facility] [address] [place], [name], [address] Phone: [phone]   Fax:  [phone]  Physical Therapy Treatment  Patient Details  Name: Russell Russell MRN: [id number] Date of Birth: 1950-05-07 Referring Provider (PT): Lauraine Rinne, PA-C   Encounter Date: 08/13/2019  PT End of Session - 08/13/19 1150    Visit Number  2    Number of Visits  17    Date for PT Re-Evaluation  11/04/19    Authorization Type  BCBS (follow medicare guidelines, 10th visit PN)    Progress Note Due on Visit  10    PT Start Time  1100    PT Stop Time  1145    PT Time Calculation (min)  45 min    Equipment Utilized During Treatment  Gait belt    Activity Tolerance  Patient tolerated treatment well    Behavior During Therapy  WFL for tasks assessed/performed       Past Medical History:  Diagnosis Date  . Alcohol abuse    drinks 1 gallon of Gin every weekend, nothing during the week x >15 yrs  . Elevated transaminase level    +elev bili: abd u/s 06/2014 showed stable small hepatic hemangiomas, o/w normal.  . Essential hypertension   . GERD (gastroesophageal reflux disease)   . Hyperlipidemia 03/2014   Atorv started-chol improved  . Impaired fasting glucose 03/2014  . Nephrolithiasis   . PUD (peptic ulcer disease)   . Tobacco dependence    chantix: "psych effects"    Past Surgical History:  Procedure Laterality Date  . COLONOSCOPY  2005   Recall 10 yrs ([place])  . LOOP RECORDER INSERTION N/A 07/03/2019   Procedure: LOOP RECORDER INSERTION;  Surgeon: Thompson Grayer, MD;  Location: [facility];  Service: Cardiovascular;  Laterality: N/A;  . removal of kidney stones  2006   cystoscopic--removed from ureter.  No prob since.    There were no vitals filed for this visit.  Subjective Assessment - 08/13/19 1118    Subjective  No new complaints or falls. Pt uses commode with help of family member. Spends most of the time  lying down or sitting.    Patient is accompained by:  Family member    Pertinent History  PMH: HTN, alcohol abuse (ongoing use), HLD, GERD, PUD, tobacco use    Limitations  Standing;Walking;House hold activities    Patient Stated Goals  return to prior level of function, walk, "wheelchair is not apart of me" (wants to get out of the w/c)    Currently in Pain?  No/denies          TherEx: done unilateral, bil SLR: 2 x 10 Heel slides: 2 x 10 Bridge: 2 x 10 SL hip abduction: 2 x 10   Seated LAQ, ankle DF, marches: 20x bil  Theractivity: Chair to bed and bed to chair tranfer: cues for hand placement, min A to move left leg Sit to supine and sidelying: 1x Sit to stand from mat table using walker: SBA with verbal cues occaisonally. 10x       Access Code: [email or web address] URL: [email or web address] Date: 08/13/2019 Prepared by: Markus Jarvis  Exercises Supine Active Straight Leg Raise - 2 x daily - 7 x weekly - 2 sets - 10 reps Supine Heel Slide - 2 x daily - 7 x weekly - 2 sets - 10 reps Supine Bridge - 2 x daily - 7 x weekly - 2 sets - 10 reps Supine Hip  Adduction Isometric with Ball - 2 x daily - 7 x weekly - 1 sets - 10 reps - 10 hold Sidelying Hip Abduction - 2 x daily - 7 x weekly - 2 sets - 10 reps Clamshell - 2 x daily - 7 x weekly - 2 sets - 10 reps  Access Code: [email or web address] URL: [email or web address] Date: 08/13/2019 Prepared by: Markus Jarvis  Exercises Seated Long Arc Quad - 2 x daily - 7 x weekly - 2 sets - 10 reps Seated March - 2 x daily - 7 x weekly - 2 sets - 10 reps Seated Toe Raise - 2 x daily - 7 x weekly - 2 sets - 10 reps Seated Heel Raise - 2 x daily - 7 x weekly - 2 sets - 10 reps                PT Short Term Goals - 08/06/19 2054      PT SHORT TERM GOAL #1   Title  Patient will be independent with Initial HEP, with wifes assistance    Time  4    Period  Weeks    Status  New    Target Date  09/03/19      PT  SHORT TERM GOAL #2   Title  Patient will demonstrate ability to complete stand static w/ LRAD and midline alignment x 4 minutes w/o LOB to demonstrate improved tolerance for ADL tasks    Baseline  <1 min w/ RW    Time  4    Period  Weeks    Status  New    Target Date  09/03/19      PT SHORT TERM GOAL #3   Title  Patient will demo ability to complete stand pivot transfer w/ LRAD and Min A for improved functional mobility    Baseline  Mod A required    Time  4    Period  Weeks    Status  New    Target Date  09/03/19      PT SHORT TERM GOAL #4   Title  Patient will demo ability to ambulate 25 feet w/ LRAD and Min A for improved mobility    Baseline  not yet assessed    Time  4    Period  Weeks    Status  New    Target Date  09/03/19        PT Long Term Goals - 08/06/19 2057      PT LONG TERM GOAL #1   Title  Patient will be independent with Final HEP, with wifes assistance    Time  8    Period  Weeks    Status  New    Target Date  10/01/19      PT LONG TERM GOAL #2   Title  Patient will demonstrate ability to complete transfer from w/c <> mat w/ CGA to demo improved independence with transfers.    Baseline  Mod A    Time  8    Period  Weeks    Status  New    Target Date  10/01/19      PT LONG TERM GOAL #3   Title  Patient will demonstrate ability to ambulate 50 ft w/ LRAD and min A to demonstrate improved household mobility    Time  8    Period  Weeks    Status  New    Target Date  10/01/19      PT LONG  TERM GOAL #4   Title  Patient will demonstrate ability to complete sit <> stand Mod I to demonstrate improved functional mobility    Time  8    Period  Weeks    Status  New    Target Date  10/01/19            Plan - 08/13/19 1141    Clinical Impression Statement  Significant weakness in L hip abduction noted with exercises. Patient requires cueing due to lack of attention at times. Patient has difficulty with moving the left leg when standing and requires  additional times.    Personal Factors and Comorbidities  Comorbidity 3+    Comorbidities  HTN, alcohol abuse (ongoing use), HLD, GERD, PUD, tobacco use    Examination-Activity Limitations  Bed Mobility;Dressing;Locomotion Level;Stairs;Stand;Toileting;Transfers;Bathing    Examination-Participation Restrictions  Driving;Interpersonal Relationship;Yard Work    [employer]  Evolving/Moderate complexity    Rehab Potential  Good    PT Frequency  2x / week    PT Duration  8 weeks    PT Treatment/Interventions  ADLs/Self Care Home Management;Electrical Stimulation;DME Instruction;Moist Heat;Cryotherapy;Gait training;Stair training;Functional mobility training;Therapeutic activities;Therapeutic exercise;Balance training;Neuromuscular re-education;Patient/family education;Orthotic Fit/Training;Wheelchair mobility training;Passive range of motion    PT Next Visit Plan  Assess gait in parallel bars, transfer training, work on midline (with mirror) due to patietns tendency to push to L side    Consulted and Agree with Plan of Care  Patient;Family member/caregiver    Family Member Consulted  Wife       Patient will benefit from skilled therapeutic intervention in order to improve the following deficits and impairments:  Abnormal gait, Decreased coordination, Difficulty walking, Impaired tone, Decreased safety awareness, Decreased endurance, Decreased activity tolerance, Pain, Decreased balance, Decreased knowledge of use of DME, Postural dysfunction, Impaired sensation, Decreased strength, Decreased mobility  Visit Diagnosis: Unsteadiness on feet  Muscle weakness (generalized)  Cerebrovascular accident (CVA) of right basal ganglia (HCC)  Other abnormalities of gait and mobility     Problem List Patient Active Problem List   Diagnosis Date Noted  . AKI (acute kidney injury) ([facility])   . Fever   . Sepsis without acute organ dysfunction ([place])   . Elevated BUN   . Blood  pressure increase diastolic   . Labile blood pressure   . Leukopenia   . Benign essential HTN   . Hypoalbuminemia   . Cerebrovascular accident (CVA) of right basal ganglia ([place]) 07/07/2019  . Acute ischemic stroke ([place])   . Alcohol abuse   . Dyslipidemia   . Dysphagia, post-stroke   . Polycythemia   . Stroke ([facility]) 06/30/2019  . Prediabetes 12/23/2016  . Elevated LFTs 12/23/2016  . Pure hypercholesterolemia 12/21/2016  . Alcoholism ([place]) 12/21/2016  . Eczema 09/18/2013  . Essential hypertension 09/18/2013  . Gastroesophageal reflux disease without esophagitis 09/18/2013  . Tobacco abuse 09/18/2013    Kerrie Pleasure 08/13/2019, 11:50 AM  [facility] [address] [place], [name], [address] Phone: [phone]   Fax:  [phone]  Name: Taurence Goos MRN: [id number] Date of Birth: Jan 30, 1951

## 2019-08-13 NOTE — Therapy (Signed)
[facility] [address] [place], [name], [address] Phone: [phone]   Fax:  [phone]  Speech Language Pathology Treatment  Patient Details  Name: Russell Russell MRN: [id number] Date of Birth: 19-Jul-1950 Referring Provider (SLP): Stann Mainland, [place]   Encounter Date: 08/13/2019  End of Session - 08/13/19 1354    Visit Number  2    Number of Visits  17    Date for SLP Re-Evaluation  11/05/19    SLP Start Time  [id number]    SLP Stop Time   1230    SLP Time Calculation (min)  42 min    Activity Tolerance  Patient tolerated treatment well       Past Medical History:  Diagnosis Date  . Alcohol abuse    drinks 1 gallon of Gin every weekend, nothing during the week x >15 yrs  . Elevated transaminase level    +elev bili: abd u/s 06/2014 showed stable small hepatic hemangiomas, o/w normal.  . Essential hypertension   . GERD (gastroesophageal reflux disease)   . Hyperlipidemia 03/2014   Atorv started-chol improved  . Impaired fasting glucose 03/2014  . Nephrolithiasis   . PUD (peptic ulcer disease)   . Tobacco dependence    chantix: "psych effects"    Past Surgical History:  Procedure Laterality Date  . COLONOSCOPY  2005   Recall 10 yrs ([place])  . LOOP RECORDER INSERTION N/A 07/03/2019   Procedure: LOOP RECORDER INSERTION;  Surgeon: Thompson Grayer, MD;  Location: [facility];  Service: Cardiovascular;  Laterality: N/A;  . removal of kidney stones  2006   cystoscopic--removed from ureter.  No prob since.    There were no vitals filed for this visit.  Subjective Assessment - 08/13/19 1346    Subjective  Pt brought EMST device    Patient is accompained by:  Family member   Wife Russell Russell   Currently in Pain?  No/denies            ADULT SLP TREATMENT - 08/13/19 1346      General Information   Behavior/Cognition  Alert;Cooperative;Pleasant mood      Treatment Provided   Treatment provided   Cognitive-Linquistic      Pain Assessment   Pain Assessment  No/denies pain      Cognitive-Linquistic Treatment   Treatment focused on  Dysarthria;Cognition;Patient/family/caregiver education    Skilled Treatment  Patient EMST device set to 13 cm H20. Pt demo'd reps without counting (rehab prescribed 25 reps), initially with good form/technique but after ~10 reps suboptimal inspiration and not achieving adequate burst of air from the device. SLP paused pt after 5 reps for 30-60 seconds, 4/5 reps were satisfactory with each set. Pt unable to keep track of reps, even when SLP instructed pt to count on his fingers; will likely need some level of assistance with this due to attention deficits. Provided wife with handout and instructions for home practice. Also took measurement of pt's MIP (33) and MEP (45) this session. Did not adjust device settings this session as pt not achieving this with current setting of 13 although with the above data pt should be able to increase intensity to 75% of MEP. Pt looking out the window periodically, so SLP closed blinds and used this opportunity to discuss decreased attention. Pt agreed with this and wife provided example of pt starting to respond to her about what he wanted to eat, but then getting distracted by TV. See pt instructions for  communication/attention strategies discussed. Also generated everday phrases for pt speech practice.      Assessment / Recommendations / Plan   Plan  Continue with current plan of care      Progression Toward Goals   Progression toward goals  Progressing toward goals       SLP Education - 08/13/19 1353    Education Details  EMST procedure, attention/communication strategies    Person(s) Educated  Patient    Methods  Explanation    Comprehension  Verbalized understanding       SLP Short Term Goals - 08/13/19 1354      SLP SHORT TERM GOAL #1   Title  pt will demo the knowledge that he can use a memory system for daily  events, ID'ing prospective and past events, medication management, and appoitmetn tracking, etc x 3 sessions    Time  4    Period  Weeks   or 9 total visits, for all STGs   Status  On-going      SLP SHORT TERM GOAL #2   Title  pt will demo selective atteniton for 5 mintues to perform simple cognitive lingiustic tasks in a min-mod noisy environment x2 sessions    Time  4    Period  Weeks    Status  On-going      SLP SHORT TERM GOAL #3   Title  pt will tell SLP 2 congitive deficits affecting his life at home post-CVA in two sessions    Time  4    Period  Weeks    Status  On-going      SLP SHORT TERM GOAL #4   Title  pt will incr expiratory muscle strength taken in first 2 ST sessions via spirometer    Baseline  MEP 45 on 08/13/19    Time  4    Period  Weeks    Status  On-going       SLP Long Term Goals - 08/13/19 1355      SLP LONG TERM GOAL #1   Title  pt will demo simple conversation of 5 mintues with average mid 60s dB x 3 sessions    Time  8    Period  Weeks   or 17 sessions   Status  On-going      SLP LONG TERM GOAL #2   Title  pt will demo selective atteniton for 10 mintues to perform simple cognitive lingiustic tasks in a min-mod noisy environment x3 sessions    Time  8    Period  Weeks    Status  On-going      SLP LONG TERM GOAL #3   Title  pt will demo WFL alternating attention in simple cognitve linguistic tasks functional for pt in 3 sessions    Time  8    Period  Weeks    Status  On-going      SLP LONG TERM GOAL #4   Title  pt will independnently access memory notebook/planner for appoitnment management, to-do-lists, recall of prospective and past events, etc in 3 sessions    Time  8    Period  Weeks    Status  On-going       Plan - 08/13/19 1354    Clinical Impression Statement  Pt presents today with significant cognitive communication deficits - Cogntive Linguistic Quick Test was initiated today and will be completed next session or two sessions.  Pt's deficits in these areas will affect his ability to  be independent and at their current level will require consistent assistance from wife/family for most needs. Pt also presents with reduced voice volume (low 60s dB most of the time), affecting intelligibility with wife at home. SLP recommends skilled ST to address cognitive linguistics and dysarthria c/b reduced voice volume.    Speech Therapy Frequency  2x / week    Duration  --   8 weeks or 17 total visits   Treatment/Interventions  Environmental controls;Other (comment);Compensatory techniques;Functional tasks;SLP instruction and feedback;Cueing hierarchy;Cognitive reorganization;Patient/family education;Internal/external aids   EMST   Potential to Achieve Goals  Fair    Potential Considerations  Severity of impairments;Ability to learn/carryover information    Consulted and Agree with Plan of Care  Patient;Family member/caregiver    Family Member Consulted  wife       Patient will benefit from skilled therapeutic intervention in order to improve the following deficits and impairments:   Dysarthria and anarthria  Cognitive communication deficit  Cerebrovascular accident (CVA) of right basal ganglia ([place])    Problem List Patient Active Problem List   Diagnosis Date Noted  . AKI (acute kidney injury) ([place])   . Fever   . Sepsis without acute organ dysfunction ([place])   . Elevated BUN   . Blood pressure increase diastolic   . Labile blood pressure   . Leukopenia   . Benign essential HTN   . Hypoalbuminemia   . Cerebrovascular accident (CVA) of right basal ganglia ([place]) 07/07/2019  . Acute ischemic stroke ([place])   . Alcohol abuse   . Dyslipidemia   . Dysphagia, post-stroke   . Polycythemia   . Stroke ([place]) 06/30/2019  . Prediabetes 12/23/2016  . Elevated LFTs 12/23/2016  . Pure hypercholesterolemia 12/21/2016  . Alcoholism ([place]) 12/21/2016  . Eczema 09/18/2013  . Essential hypertension 09/18/2013  . Gastroesophageal  reflux disease without esophagitis 09/18/2013  . Tobacco abuse 09/18/2013   Deneise Lever, [place], [place] Speech-Language Pathologist  Aliene Altes 08/13/2019, 1:56 PM  [facility] [address] [place], [name], [address] Phone: [phone]   Fax:  [phone]   Name: Russell Russell MRN: [id number] Date of Birth: [birth year]

## 2019-08-17 ENCOUNTER — Other Ambulatory Visit: Payer: Self-pay

## 2019-08-17 ENCOUNTER — Encounter: Payer: Self-pay | Admitting: Registered Nurse

## 2019-08-17 ENCOUNTER — Encounter: Payer: BC Managed Care – PPO | Attending: Registered Nurse | Admitting: Registered Nurse

## 2019-08-17 ENCOUNTER — Telehealth: Payer: Self-pay | Admitting: Registered Nurse

## 2019-08-17 ENCOUNTER — Telehealth: Payer: Self-pay | Admitting: Physician Assistant

## 2019-08-17 ENCOUNTER — Telehealth: Payer: Self-pay | Admitting: *Deleted

## 2019-08-17 VITALS — BP 92/64 | HR 75 | Temp 97.5°F | Ht 70.0 in | Wt 159.0 lb

## 2019-08-17 DIAGNOSIS — I639 Cerebral infarction, unspecified: Secondary | ICD-10-CM | POA: Insufficient documentation

## 2019-08-17 DIAGNOSIS — I10 Essential (primary) hypertension: Secondary | ICD-10-CM | POA: Diagnosis not present

## 2019-08-17 DIAGNOSIS — I6381 Other cerebral infarction due to occlusion or stenosis of small artery: Secondary | ICD-10-CM

## 2019-08-17 NOTE — Patient Instructions (Signed)
MY Chart: [phone]

## 2019-08-17 NOTE — Telephone Encounter (Signed)
New message   Patient's wife will be coming with Tommye Standard to office visit on 08/19/19 because the patient is in a wheel chair.

## 2019-08-17 NOTE — Telephone Encounter (Signed)
Placed a call to [name] Regarding Russell Russell Hypotension.  She was instructed to check his Blood Pressure:  Right: 96/72 P 80 Left: 91/69 P 78  [name] reports she is still awaiting PCP return call.  She was instructed to keep a blood Pressure Log. His Evening Metoprolol was placed on hold. She was instructed to check [name] Blood pressure at 8:00 p.m. Also instructed to call PCP office in the AM with Blood Pressure Readings and to Keep a Blood Pressure Log from this evening and Morning, prior to administering morning medication, she verbalizes understanding.  At this time [name] is laying on couch resting. [name] was instructed to call PA on call if blood pressure is elevated, this provider spoke with Reesa Chew PA today regarding [name], she verbalizes understanding.  She was instructed

## 2019-08-17 NOTE — Telephone Encounter (Signed)
Russell Russell called for [place]. She reports her husband's BP was 83/61. She requests a call back from [place].

## 2019-08-17 NOTE — Telephone Encounter (Signed)
Pt wife called stating he was seen at [facility] this morning. Wife states provider reported pt BP to be 83/61. Provider that saw pt wanted pt to follow up with Inda Coke to evaluate his medication and BP. Provider is asking for follow up from [facility]. Please advise.

## 2019-08-17 NOTE — Progress Notes (Signed)
Subjective:    Patient ID: Russell Russell, male    DOB: Oct 13, 1950, 69 y.o.   MRN: [id number]  HPI: Russell Russell is a 69 y.o. male who returns for hospital  follow up appointment for his CVA of right basal Ganglia and essential hypertension.  Russell Russell was found down in his home on 06/30/2019, and brought to the ER via EMS. Neurology was consulted.    CT Head WO Contrast:  IMPRESSION: 1. Chronic ischemic microangiopathy and generalized atrophy without acute intracranial abnormality.  CT ANGIO Of Head and Neck:  IMPRESSION: 1. No emergent large vessel occlusion or high-grade stenosis of the intracranial arteries. 2. Area of ischemia identified in the right frontal periventricular white matter and left cerebellum, corresponding to the areas of hypoattenuation on the noncontrast head CT and possibly artifact/pseudonormalization. No core infarct by CT perfusion analysis. 3. Aortic Atherosclerosis (ICD10-I70.0). 4. 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 Recommend annual imaging followup by CTA or MRA. This recommendation follows 2010 ACCF/AHA/AATS/ACR/ASA/SCA/SCAI/SIR/STS/SVM Guidelines for the Diagnosis and Management of Patients with Thoracic Aortic Disease. Circulation.2010; 121: W098-J191. Aortic aneurysm NOS (ICD10-I71.9)  MRI Brain:  IMPRESSION: 1. Multiple scattered acute ischemic infarcts involving the posterior right basal ganglia, right cerebral hemisphere, and right cerebellum as above. No associated hemorrhage or mass effect. 2. Underlying atrophy with advanced chronic microvascular ischemic disease, with multiple remote lacunar infarcts about the bilateral basal ganglia, thalami, and pons, with additional chronic left cerebellar infarct.  Russell Russell was admitted to Inpatient Rehabilitation on 07/07/2019 and Discharged home on 08/05/2019. Russell Russell going to Outpatient Neuro Rehabilitation. He denies any pain. He rates his pain 0. Also reports he has a good appetite.   Russell Russell.  Russell arrived to office hypotensive, blood pressure was re-checked on both arms. Russell Russell was instructed to keep a blood pressure log and to call his PCP today. Also instructed to call this provider with a 4:00 blood pressure reading. Russell Russell reports Russell Russell blood pressure was running low at home, also states Russell Russell wasn't symptomatic. Russell Russell reports at times he noticed when his blood pressure was low he felt dizzy, he denies dizziness at this time.   He has a cardiology appointment on 08/20/2019. Russell Russell verbalizes understanding of the above.   Russell Russell arrived in wheelchair.   Pain Inventory Average Pain 0 Pain Right Now 0 My pain is no pain  In the last 24 hours, has pain interfered with the following? General activity 0 Relation with others 0 Enjoyment of life 0 What TIME of day is your pain at its worst? no pain Sleep (in general) Good  Pain is worse with: no pain Pain improves with: no pain Relief from Meds: no pain  Mobility walk with assistance use a walker ability to climb steps?  no do you drive?  no use a wheelchair transfers alone  Function retired I need assistance with the following:  toileting  Neuro/Psych trouble walking  Prior Studies transitional  Physicians involved in your care transitional   Family History  Problem Relation Age of Onset  . Colon cancer Mother   . Cancer Father   . Breast cancer Sister   . Breast cancer Sister    Social History   Socioeconomic History  . Marital status: Married    Spouse name: Not on file  . Number of children: Not on file  . Years of education: Not on file  . Highest education level: Not on file  Occupational History  .  Not on file  Tobacco Use  . Smoking status: Current Every Day Smoker    Packs/day: 0.50    Years: 20.00    Pack years: 10.00    Types: Cigarettes  . Smokeless tobacco: Never Used  Substance and Sexual Activity  . Alcohol use: Yes     Alcohol/week: 8.0 standard drinks    Types: 8 Cans of beer per week    Comment: Weekends only  . Drug use: No  . Sexual activity: Not on file  Other Topics Concern  . Not on file  Social History Narrative   Married, no children.   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   Tob: 10 pack-yr hx (current as of 03/2014).   Alcohol: 8 beers and pint of whisky on weekends only.   Denies hx of prob with drugs or alcohol.   Social Determinants of Health   Financial Resource Strain:   . Difficulty of Paying Living Expenses:   Food Insecurity:   . Worried About Charity fundraiser in the Last Year:   . Arboriculturist in the Last Year:   Transportation Needs:   . Film/video editor (Medical):   Marland Kitchen Lack of Transportation (Non-Medical):   Physical Activity:   . Days of Exercise per Week:   . Minutes of Exercise per Session:   Stress:   . Feeling of Stress :   Social Connections:   . Frequency of Communication with Friends and Family:   . Frequency of Social Gatherings with Friends and Family:   . Attends Religious Services:   . Active Member of Clubs or Organizations:   . Attends Archivist Meetings:   Marland Kitchen Marital Status:    Past Surgical History:  Procedure Laterality Date  . COLONOSCOPY  2005   Recall 10 yrs ([place])  . LOOP RECORDER INSERTION N/A 07/03/2019   Procedure: LOOP RECORDER INSERTION;  Surgeon: Thompson Grayer, MD;  Location: [facility];  Service: Cardiovascular;  Laterality: N/A;  . removal of kidney stones  2006   cystoscopic--removed from ureter.  No prob since.   Past Medical History:  Diagnosis Date  . Alcohol abuse    drinks 1 gallon of Gin every weekend, nothing during the week x >15 yrs  . Elevated transaminase level    +elev bili: abd u/s 06/2014 showed stable small hepatic hemangiomas, o/w normal.  . Essential hypertension   . GERD (gastroesophageal reflux disease)   . Hyperlipidemia 03/2014     Atorv started-chol improved  . Impaired fasting glucose 03/2014  . Nephrolithiasis   . PUD (peptic ulcer disease)   . Tobacco dependence    chantix: "psych effects"   BP (!) 86/62   Pulse 80   Temp (!) 97.5 F (36.4 C)   Ht 5\' 10"  (1.778 m)   Wt 159 lb (72.1 kg)   SpO2 98%   BMI 22.81 kg/m   Opioid Risk Score:   Fall Risk Score:  `1  Depression screen PHQ 2/9  Depression screen [facility] 2/9 02/24/2019 12/21/2017 12/21/2016  Decreased Interest 0 0 0  Down, Depressed, Hopeless 0 0 0  PHQ - 2 Score 0 0 0    Review of Systems  Constitutional: Negative.   HENT: Negative.   Eyes: Negative.   Respiratory: Negative.   Cardiovascular: Negative.   Gastrointestinal: Negative.   Endocrine: Negative.   Genitourinary: Negative.   Musculoskeletal: Positive for gait problem.  Skin: Negative.   Allergic/Immunologic: Negative.   Hematological: Negative.   Psychiatric/Behavioral: Negative.   All other systems reviewed and are negative.      Objective:   Physical Exam Vitals and nursing note reviewed.  Constitutional:      Appearance: Normal appearance.  Cardiovascular:     Rate and Rhythm: Normal rate and regular rhythm.     Pulses: Normal pulses.     Heart sounds: Normal heart sounds.  Pulmonary:     Effort: Pulmonary effort is normal.     Breath sounds: Normal breath sounds.  Musculoskeletal:     Cervical back: Normal range of motion and neck supple.     Comments: Normal Muscle Bulk and Muscle Testing Reveals:  Upper Extremities: Right: Full ROM and Muscle Strength 5/5 Left: Decreased ROM 90 Degrees and Muscle Strength 4/5  Lower Extremities: Right: Full ROM and Muscle Strength 5/5 Left: Full ROM and Muscle Strength 4/5 Arrived in wheelchair  Skin:    General: Skin is warm and dry.  Neurological:     Mental Status: He is alert and oriented to person, place, and time.  Psychiatric:        Mood and Affect: Mood normal.        Behavior: Behavior normal.            Assessment & Plan:  1. CVA of right basal Ganglia: Continue Neuro Rehabilitation Outpatient Therapy. He has a scheduled HFU appointment with Russell Russell. Continue current medication Regimen.  2. Essential hypertension: Russell Russell was instructed to call PCP for HFU appointment, she verbalizes understanding.  3. Hypotension: Arrived Hypotensive. Blood Pressure was rechecked.Russell Russell. and Russell Russell was instructed to call PCP regarding hypotension. Also instructed to call this provider by 4:00 pm with a blood pressure reading. Mrs. Kasparian verbalizes understanding. Continue to monitor.   20 minutes of face to face patient care time was spent during this visit. All questions were encouraged and answered.  F/U with Russell Posey Pronto in 4- 6 weeks

## 2019-08-17 NOTE — Telephone Encounter (Signed)
Please schedule office visit.

## 2019-08-17 NOTE — Telephone Encounter (Signed)
Pts wife is calling in to inform us that she will accompany the pt to his OV with Tommye Standard PA-C on 08/20/19.  Wife states that the pt is wheelchair bound, and will need her assistance. Updated this in appt notes, to allow pts wife [name], up to the appt with the pt on 08/20/19.

## 2019-08-17 NOTE — Telephone Encounter (Signed)
Return Russell Russell call, She was asked to take Russell Russell blood Pressure on both arms.  Right: 98/70 P: 80  Left:  94/62 P 80 Russell Russell is asymptomatic at this time.  She also reports she called his PCP, awaiting a return call. This Provider will give them a call prior to leaving for the day. She verbalizes understanding.

## 2019-08-18 ENCOUNTER — Ambulatory Visit: Payer: BC Managed Care – PPO

## 2019-08-18 ENCOUNTER — Ambulatory Visit: Payer: BC Managed Care – PPO | Admitting: Speech Pathology

## 2019-08-18 ENCOUNTER — Encounter: Payer: Self-pay | Admitting: Physician Assistant

## 2019-08-18 ENCOUNTER — Ambulatory Visit (INDEPENDENT_AMBULATORY_CARE_PROVIDER_SITE_OTHER): Payer: BC Managed Care – PPO | Admitting: Physician Assistant

## 2019-08-18 VITALS — BP 104/80 | HR 85 | Temp 97.3°F | Ht 70.0 in | Wt 159.0 lb

## 2019-08-18 DIAGNOSIS — R2689 Other abnormalities of gait and mobility: Secondary | ICD-10-CM

## 2019-08-18 DIAGNOSIS — R2681 Unsteadiness on feet: Secondary | ICD-10-CM | POA: Diagnosis not present

## 2019-08-18 DIAGNOSIS — M6281 Muscle weakness (generalized): Secondary | ICD-10-CM

## 2019-08-18 DIAGNOSIS — R471 Dysarthria and anarthria: Secondary | ICD-10-CM

## 2019-08-18 DIAGNOSIS — I6340 Cerebral infarction due to embolism of unspecified cerebral artery: Secondary | ICD-10-CM

## 2019-08-18 DIAGNOSIS — I10 Essential (primary) hypertension: Secondary | ICD-10-CM | POA: Diagnosis not present

## 2019-08-18 DIAGNOSIS — I6381 Other cerebral infarction due to occlusion or stenosis of small artery: Secondary | ICD-10-CM

## 2019-08-18 DIAGNOSIS — R41841 Cognitive communication deficit: Secondary | ICD-10-CM

## 2019-08-18 NOTE — Patient Instructions (Signed)
It was great to see you!  Please stop the lisinopril.  Please cut the metoprolol 25 mg tablets in half and take 12.5 mg metoprolol in the morning and a 12.5 mg tablet in the evening.  PUSH FLUIDS -- this will help support your low blood pressure.  Take care,  Inda Coke PA-C

## 2019-08-18 NOTE — Therapy (Signed)
[name] [address] [place], [name], [address] Phone: [phone]   Fax:  [phone]  Physical Therapy Treatment  Patient Details  Name: Russell Russell MRN: [id number] Date of Birth: 1950/10/23 Referring Provider (PT): Lauraine Rinne, PA-C   Encounter Date: 08/18/2019  PT End of Session - 08/18/19 0918    Visit Number  3    Number of Visits  17    Date for PT Re-Evaluation  11/04/19    Authorization Type  BCBS (follow medicare guidelines, 10th visit PN)    Progress Note Due on Visit  10    PT Start Time  0805    PT Stop Time  0850    PT Time Calculation (min)  45 min    Equipment Utilized During Treatment  Gait belt    Activity Tolerance  Patient tolerated treatment well    Behavior During Therapy  WFL for tasks assessed/performed       Past Medical History:  Diagnosis Date   Alcohol abuse    drinks 1 gallon of Gin every weekend, nothing during the week x >15 yrs   Elevated transaminase level    +elev bili: abd u/s 06/2014 showed stable small hepatic hemangiomas, o/w normal.   Essential hypertension    GERD (gastroesophageal reflux disease)    Hyperlipidemia 03/2014   Atorv started-chol improved   Impaired fasting glucose 03/2014   Nephrolithiasis    PUD (peptic ulcer disease)    Tobacco dependence    chantix: "psych effects"    Past Surgical History:  Procedure Laterality Date   COLONOSCOPY  2005   Recall 10 yrs ([place])   LOOP RECORDER INSERTION N/A 07/03/2019   Procedure: LOOP RECORDER INSERTION;  Surgeon: Thompson Grayer, MD;  Location: [facility];  Service: Cardiovascular;  Laterality: N/A;   removal of kidney stones  2006   cystoscopic--removed from ureter.  No prob since.    There were no vitals filed for this visit.  Subjective Assessment - 08/18/19 0817    Subjective  Pt reports compliance with exercises. He walked 6 feet.    Patient is accompained by:  Family  member    Pertinent History  PMH: HTN, alcohol abuse (ongoing use), HLD, GERD, PUD, tobacco use    Limitations  Standing;Walking;House hold activities    Patient Stated Goals  return to prior level of function, walk, "wheelchair is not apart of me" (wants to get out of the w/c)    Currently in Pain?  No/denies            TherEx: done unilateral, bil SLR: 2 x 10, 2lbs , min A with L LE SL clamshells: 2 x 10 R and L SL hip abduction: 2 x 10 R and L, 2lbs min A required with L LE Prone knee flexion: 2 x 10, 2lbs R and L, cues for eccentric control l  Theractivity: Chair to bed and bed to chair tranfer: cues for hand placement, min A for balance, cues for turning fully Gait training in parallel bars: 6 x 10 laps with 180 deg turns, min A with weight shifts and cues for picking up L LE, cues for visual cueing to improve hand grasping and movement of L LE Wheelchair propulusion with UE: min A with moving L UE back on the top of the wheel: 80 feet       Access Code: [email or web address] URL: [email or web address] Date: 08/13/2019 Prepared by: Markus Jarvis  Exercises Supine Active Straight Leg  Raise - 2 x daily - 7 x weekly - 2 sets - 10 reps Supine Heel Slide - 2 x daily - 7 x weekly - 2 sets - 10 reps Supine Bridge - 2 x daily - 7 x weekly - 2 sets - 10 reps Supine Hip Adduction Isometric with Ball - 2 x daily - 7 x weekly - 1 sets - 10 reps - 10 hold Sidelying Hip Abduction - 2 x daily - 7 x weekly - 2 sets - 10 reps Clamshell - 2 x daily - 7 x weekly - 2 sets - 10 reps  Access Code: [email or web address] URL: [email or web address] Date: 08/13/2019 Prepared by: Markus Jarvis  Exercises Seated Long Arc Quad - 2 x daily - 7 x weekly - 2 sets - 10 reps Seated March - 2 x daily - 7 x weekly - 2 sets - 10 reps Seated Toe Raise - 2 x daily - 7 x weekly - 2 sets - 10 reps Seated Heel Raise - 2 x daily - 7 x weekly - 2 sets - 10  reps                             PT Short Term Goals - 08/06/19 2054      PT SHORT TERM GOAL #1   Title  Patient will be independent with Initial HEP, with wifes assistance    Time  4    Period  Weeks    Status  New    Target Date  09/03/19      PT SHORT TERM GOAL #2   Title  Patient will demonstrate ability to complete stand static w/ LRAD and midline alignment x 4 minutes w/o LOB to demonstrate improved tolerance for ADL tasks    Baseline  <1 min w/ RW    Time  4    Period  Weeks    Status  New    Target Date  09/03/19      PT SHORT TERM GOAL #3   Title  Patient will demo ability to complete stand pivot transfer w/ LRAD and Min A for improved functional mobility    Baseline  Mod A required    Time  4    Period  Weeks    Status  New    Target Date  09/03/19      PT SHORT TERM GOAL #4   Title  Patient will demo ability to ambulate 25 feet w/ LRAD and Min A for improved mobility    Baseline  not yet assessed    Time  4    Period  Weeks    Status  New    Target Date  09/03/19        PT Long Term Goals - 08/06/19 2057      PT LONG TERM GOAL #1   Title  Patient will be independent with Final HEP, with wifes assistance    Time  8    Period  Weeks    Status  New    Target Date  10/01/19      PT LONG TERM GOAL #2   Title  Patient will demonstrate ability to complete transfer from w/c <> mat w/ CGA to demo improved independence with transfers.    Baseline  Mod A    Time  8    Period  Weeks    Status  New    Target Date  10/01/19  PT LONG TERM GOAL #3   Title  Patient will demonstrate ability to ambulate 50 ft w/ LRAD and min A to demonstrate improved household mobility    Time  8    Period  Weeks    Status  New    Target Date  10/01/19      PT LONG TERM GOAL #4   Title  Patient will demonstrate ability to complete sit <> stand Mod I to demonstrate improved functional mobility    Time  8    Period  Weeks    Status  New    Target  Date  10/01/19            Plan - 08/18/19 0905    Clinical Impression Statement  Pt tolerated session well. pt requires max A with ambulation with use of parallel bars and with tactile cues for weight shifting and picking up left leg. Pt rquires max A with use of parallel bars and cueing for safe turning. Pt demonstrated improved walking and turning before sitting down compared to last session.    Personal Factors and Comorbidities  Comorbidity 3+    Comorbidities  HTN, alcohol abuse (ongoing use), HLD, GERD, PUD, tobacco use    Examination-Activity Limitations  Bed Mobility;Dressing;Locomotion Level;Stairs;Stand;Toileting;Transfers;Bathing    Examination-Participation Restrictions  Driving;Interpersonal Relationship;Yard Work    Merchant [employer] officer  Evolving/Moderate complexity    Clinical Decision Making  Moderate    Rehab Potential  Good    PT Frequency  2x / week    PT Duration  8 weeks    PT Treatment/Interventions  ADLs/Self Care Home Management;Electrical Stimulation;DME Instruction;Moist Heat;Cryotherapy;Gait training;Stair training;Functional mobility training;Therapeutic activities;Therapeutic exercise;Balance training;Neuromuscular re-education;Patient/family education;Orthotic Fit/Training;Wheelchair mobility training;Passive range of motion       Patient will benefit from skilled therapeutic intervention in order to improve the following deficits and impairments:  Abnormal gait, Decreased coordination, Difficulty walking, Impaired tone, Decreased safety awareness, Decreased endurance, Decreased activity tolerance, Pain, Decreased balance, Decreased knowledge of use of DME, Postural dysfunction, Impaired sensation, Decreased strength, Decreased mobility  Visit Diagnosis: Cerebrovascular accident (CVA) of right basal ganglia (HCC)  Unsteadiness on feet  Muscle weakness (generalized)  Other abnormalities of gait and mobility     Problem List Patient  Active Problem List   Diagnosis Date Noted   AKI (acute kidney injury) ([place])    Fever    Sepsis without acute organ dysfunction (HCC)    Elevated BUN    Blood pressure increase diastolic    Labile blood pressure    Leukopenia    Benign essential HTN    Hypoalbuminemia    Cerebrovascular accident (CVA) of right basal ganglia ([place]) 07/07/2019   Acute ischemic stroke ([place])    Alcohol abuse    Dyslipidemia    Dysphagia, post-stroke    Polycythemia    Stroke ([place]) 06/30/2019   Prediabetes 12/23/2016   Elevated LFTs 12/23/2016   Pure hypercholesterolemia 12/21/2016   Alcoholism ([place]) 12/21/2016   Eczema 09/18/2013   Essential hypertension 09/18/2013   Gastroesophageal reflux disease without esophagitis 09/18/2013   Tobacco abuse 09/18/2013    Kerrie Pleasure 08/18/2019, 9:18 AM  [facility] [address] [place], [name], [address] Phone: [phone]   Fax:  [phone]  Name: Russell Russell MRN: [id number] Date of Birth: March 24, 1950

## 2019-08-18 NOTE — Therapy (Signed)
[facility] [address] [place], [name], [address] Phone: [phone]   Fax:  [phone]  Speech Language Pathology Treatment  Patient Details  Name: Russell Russell MRN: [id number] Date of Birth: December 01, 1950 Referring Provider (SLP): Stann Mainland, [place]   Encounter Date: 08/18/2019  End of Session - 08/18/19 1507    Visit Number  2    Number of Visits  17    Date for SLP Re-Evaluation  11/05/19    SLP Start Time  0849    SLP Stop Time   0930    SLP Time Calculation (min)  41 min    Activity Tolerance  Patient tolerated treatment well       Past Medical History:  Diagnosis Date  . Alcohol abuse    drinks 1 gallon of Gin every weekend, nothing during the week x >15 yrs  . Elevated transaminase level    +elev bili: abd u/s 06/2014 showed stable small hepatic hemangiomas, o/w normal.  . Essential hypertension   . GERD (gastroesophageal reflux disease)   . Hyperlipidemia 03/2014   Atorv started-chol improved  . Impaired fasting glucose 03/2014  . Nephrolithiasis   . PUD (peptic ulcer disease)   . Stroke ([place])   . Tobacco dependence    chantix: "psych effects"    Past Surgical History:  Procedure Laterality Date  . COLONOSCOPY  2005   Recall 10 yrs ([place])  . LOOP RECORDER INSERTION N/A 07/03/2019   Procedure: LOOP RECORDER INSERTION;  Surgeon: Thompson Grayer, MD;  Location: [facility];  Service: Cardiovascular;  Laterality: N/A;  . removal of kidney stones  2006   cystoscopic--removed from ureter.  No prob since.    There were no vitals filed for this visit.  Subjective Assessment - 08/18/19 0851    Subjective  "It's goin'"    Currently in Pain?  No/denies            ADULT SLP TREATMENT - 08/18/19 0852      General Information   Behavior/Cognition  Alert;Cooperative;Pleasant mood      Treatment Provided   Treatment provided  Cognitive-Linquistic      Pain Assessment   Pain  Assessment  No/denies pain      Cognitive-Linquistic Treatment   Treatment focused on  Dysarthria;Cognition;Patient/family/caregiver education    Skilled Treatment  Pt EMST device was set to 15 cm H20. Pt recalled instructions, "do 5 and then break," re: repetitions but did not recall number of sets. Therapist drew check boxes for pt; initial 2 sets he performed with occasional min A for full breath, rapid forceful burst but unaware of number of reps (6, then 7). In subsequent sets SLP told pt to "count in your head," and he performed number of reps correctly, then checked off on the checklist. Last sets SLP increased to 17cm H20 and had pt make tally marks after each set, which he did with rare min A. Suggested he use this strategy at home to track reps/sets. Pt did not demo intellectual awareness of memory deficit and required cues from SLP, wife, to recognize he needs wife's help to recall appointments, etc. He was oriented to day of the week but required usual mod cues to navigate calendar printout of schedule to locate correct day, as well as to answer questions appropriately about past/upcoming appointments (cues for attention). Therapist asked pt to use schedule daily and to review with his wife. Encouraged wife to assist pt with adding one task  to his schedule each day (such as therapy exercises or simple household task he can complete while seated).       Assessment / Recommendations / Plan   Plan  Continue with current plan of care      Progression Toward Goals   Progression toward goals  Progressing toward goals         SLP Short Term Goals - 08/18/19 6010      SLP SHORT TERM GOAL #1   Title  pt will demo the knowledge that he can use a memory system for daily events, ID'ing prospective and past events, medication management, and appoitmetn tracking, etc x 3 sessions    Time  3    Period  Weeks   or 9 total visits, for all STGs   Status  On-going      SLP SHORT TERM GOAL #2    Title  pt will demo selective atteniton for 5 mintues to perform simple cognitive lingiustic tasks in a min-mod noisy environment x2 sessions    Time  3    Period  Weeks    Status  On-going      SLP SHORT TERM GOAL #3   Title  pt will tell SLP 2 congitive deficits affecting his life at home post-CVA in two sessions    Time  3    Period  Weeks    Status  On-going      SLP SHORT TERM GOAL #4   Title  pt will incr expiratory muscle strength taken in first 2 ST sessions via spirometer    Baseline  MEP 45 on 08/13/19    Time  3    Period  Weeks    Status  On-going       SLP Long Term Goals - 08/18/19 0916      SLP LONG TERM GOAL #1   Title  pt will demo simple conversation of 5 mintues with average mid 60s dB x 3 sessions    Time  7    Period  Weeks   or 17 sessions   Status  On-going      SLP LONG TERM GOAL #2   Title  pt will demo selective atteniton for 10 mintues to perform simple cognitive lingiustic tasks in a min-mod noisy environment x3 sessions    Time  7    Period  Weeks    Status  On-going      SLP LONG TERM GOAL #3   Title  pt will demo WFL alternating attention in simple cognitve linguistic tasks functional for pt in 3 sessions    Time  7    Period  Weeks    Status  On-going      SLP LONG TERM GOAL #4   Title  pt will independnently access memory notebook/planner for appoitnment management, to-do-lists, recall of prospective and past events, etc in 3 sessions    Time  7    Period  Weeks    Status  On-going       Plan - 08/18/19 1507    Clinical Impression Statement  Pt presents today with significant cognitive communication deficits - Cogntive Linguistic Quick Test was initiated today and will be completed next session or two sessions. Pt's deficits in these areas will affect his ability to be independent and at their current level will require consistent assistance from wife/family for most needs. Pt also presents with reduced voice volume (low 60s dB most of  the time), affecting  intelligibility with wife at home. SLP recommends skilled ST to address cognitive linguistics and dysarthria c/b reduced voice volume.    Speech Therapy Frequency  2x / week    Duration  --   8 weeks or 17 total visits   Treatment/Interventions  Environmental controls;Other (comment);Compensatory techniques;Functional tasks;SLP instruction and feedback;Cueing hierarchy;Cognitive reorganization;Patient/family education;Internal/external aids   EMST   Potential to Achieve Goals  Fair    Potential Considerations  Severity of impairments;Ability to learn/carryover information    Consulted and Agree with Plan of Care  Patient;Family member/caregiver    Family Member Consulted  wife       Patient will benefit from skilled therapeutic intervention in order to improve the following deficits and impairments:   Dysarthria and anarthria  Cognitive communication deficit  Cerebrovascular accident (CVA) of right basal ganglia ([place])    Problem List Patient Active Problem List   Diagnosis Date Noted  . AKI (acute kidney injury) ([place])   . Fever   . Sepsis without acute organ dysfunction ([place])   . Elevated BUN   . Blood pressure increase diastolic   . Labile blood pressure   . Leukopenia   . Benign essential HTN   . Hypoalbuminemia   . Cerebrovascular accident (CVA) of right basal ganglia ([place]) 07/07/2019  . Acute ischemic stroke ([place])   . Dyslipidemia   . Dysphagia, post-stroke   . Polycythemia   . Stroke ([place]) 06/30/2019  . Prediabetes 12/23/2016  . Elevated LFTs 12/23/2016  . Pure hypercholesterolemia 12/21/2016  . Alcoholism ([place]) 12/21/2016  . Eczema 09/18/2013  . Essential hypertension 09/18/2013  . Gastroesophageal reflux disease without esophagitis 09/18/2013  . Tobacco abuse 09/18/2013   Deneise Lever, [place], [place] Speech-Language Pathologist   Aliene Altes 08/18/2019, 3:10 PM  [facility] [address] [place], [name], [address] Phone: [phone]   Fax:  [phone]   Name: Alam Guterrez MRN: [id number] Date of Birth: May 02, 1950

## 2019-08-18 NOTE — Patient Instructions (Addendum)
I turned your breathing device up to 17. Keep doing 5 sets of 5 puffs, hard and fast! Remember to count in your head to 5 for each set. After each set, make a tally on a piece of paper so you can keep track of how many sets you did.   Use your calendar to check off each day. Review your calendar everyday in the morning with [name]. Add one chore or activity for yourself for the day (such as breathing exercises, PT exercises, folding towels, etc). Review your calendar in the evening. Elta [place] off activities you completed and review what your plans are for the next day.   Spend 30 minutes every day doing an activity for your brain. See below for ideas.    Cognitive Activities you can do at home:   - [place] (easy level)  - [place]  On your computer, tablet or phone: BrainHQ Brainbashers.com Neuronation App [employer] Game App [employer] IQ Logic [place] Sort it out (easy) Photo Quiz  - what's the word Mix 2 Words Spot the difference games

## 2019-08-18 NOTE — Progress Notes (Signed)
Russell Russell is a 69 y.o. male here for a follow up of a pre-existing problem.   History of Present Illness:   Chief Complaint  Patient presents with  . Hypertension    HPI   CVA Patient was found down in his home on 06/30/19 and went to ER where he was confirmed to have CVA of R basal ganglia.  He was discharged to inpatient rehab at [facility] on 07/07/19 and discharged home on 08/05/19.   Hypotension Currently taking Lisinopril 5 mg and Metoprolol Tartrate 25 mg BID. At home blood pressure readings are: recent in the 90's/60's. Patient denies lightheadedness, pre-syncope, chest pain, SOB, blurred vision, dizziness, unusual headaches, lower leg swelling. Patient is compliant with medication. Denies excessive caffeine intake, stimulant usage, excessive alcohol intake, or increase in salt consumption.  BP Readings from Last 3 Encounters:  08/18/19 104/80  08/17/19 92/64  08/05/19 121/80   He admits to very poor water intake. Is drinking about 4 Ensures daily, but very little water.  He has a cardiology appointment on 08/20/19  Wt Readings from Last 4 Encounters:  08/18/19 159 lb (72.1 kg)  08/17/19 159 lb (72.1 kg)  07/31/19 133 lb 11.2 oz (60.6 kg)  07/03/19 139 lb 5.3 oz (63.2 kg)     Past Medical History:  Diagnosis Date  . Alcohol abuse    drinks 1 gallon of Gin every weekend, nothing during the week x >15 yrs  . Elevated transaminase level    +elev bili: abd u/s 06/2014 showed stable small hepatic hemangiomas, o/w normal.  . Essential hypertension   . GERD (gastroesophageal reflux disease)   . Hyperlipidemia 03/2014   Atorv started-chol improved  . Impaired fasting glucose 03/2014  . Nephrolithiasis   . PUD (peptic ulcer disease)   . Stroke ([place])   . Tobacco dependence    chantix: "psych effects"     Social History   Tobacco Use  . Smoking status: Current Every Day Smoker    Packs/day: 0.50    Years: 20.00    Pack years: 10.00    Types: Cigarettes  .  Smokeless tobacco: Never Used  Substance Use Topics  . Alcohol use: Yes    Alcohol/week: 8.0 standard drinks    Types: 8 Cans of beer per week    Comment: Weekends only  . Drug use: No    Past Surgical History:  Procedure Laterality Date  . COLONOSCOPY  2005   Recall 10 yrs ([place])  . LOOP RECORDER INSERTION N/A 07/03/2019   Procedure: LOOP RECORDER INSERTION;  Surgeon: Thompson Grayer, MD;  Location: [facility];  Service: Cardiovascular;  Laterality: N/A;  . removal of kidney stones  2006   cystoscopic--removed from ureter.  No prob since.    Family History  Problem Relation Age of Onset  . Colon cancer Mother   . Cancer Father   . Breast cancer Sister   . Breast cancer Sister     Allergies  Allergen Reactions  . Penicillins Rash    Current Medications:   Current Outpatient Medications:  .  atorvastatin (LIPITOR) 40 MG tablet, Take 1 tablet (40 mg total) by mouth daily., Disp: 90 tablet, Rfl: 1 .  clopidogrel (PLAVIX) 75 MG tablet, Take 1 tablet (75 mg total) by mouth daily. Continue for 3 months then stop., Disp: 30 tablet, Rfl: 1 .  folic acid (FOLVITE) 1 MG tablet, Take 1 tablet (1 mg total) by mouth daily., Disp: 30 tablet, Rfl: 3 .  halobetasol (ULTRAVATE) 0.05 % cream, Apply topically 2 (two) times daily., Disp: 50 g, Rfl: 3 .  lisinopril (ZESTRIL) 5 MG tablet, Take 1 tablet (5 mg total) by mouth daily., Disp: 30 tablet, Rfl: 0 .  metoprolol tartrate (LOPRESSOR) 25 MG tablet, Take 1 tablet (25 mg total) by mouth 2 (two) times daily., Disp: 60 tablet, Rfl: 0 .  Multiple Vitamin (MULTIVITAMIN WITH MINERALS) TABS tablet, Take 1 tablet by mouth daily., Disp:  , Rfl:  .  nicotine (NICODERM CQ - DOSED IN MG/24 HOURS) 14 mg/24hr patch, 14 mg patch daily x3 weeks then 7 mg patch daily x3 weeks and stop, Disp: 28 patch, Rfl: 0 .  omeprazole (PRILOSEC) 20 MG capsule, Take 20 mg by mouth as needed (acid reflux)., Disp: , Rfl:  .  acetaminophen (TYLENOL) 325 MG  tablet, Take 2 tablets (650 mg total) by mouth every 4 (four) hours as needed for mild pain (or temp > 37.5 C (99.5 F)). (Patient not taking: Reported on 08/17/2019), Disp:  , Rfl:  .  aspirin 325 MG tablet, Take 1 tablet (325 mg total) by mouth daily. (Patient not taking: Reported on 08/18/2019), Disp:  , Rfl:  .  pantoprazole (PROTONIX) 40 MG tablet, Take 1 tablet (40 mg total) by mouth daily. (Patient not taking: Reported on 08/17/2019), Disp: 30 tablet, Rfl: 0 .  polycarbophil (FIBERCON) 625 MG tablet, Take 2 tablets (1,250 mg total) by mouth 2 (two) times daily. (Patient not taking: Reported on 08/17/2019), Disp: 60 tablet, Rfl: 0   Review of Systems:   ROS  Negative unless otherwise specified per HPI.  Vitals:   Vitals:   08/18/19 1009  BP: 104/80  Pulse: 85  Temp: (!) 97.3 F (36.3 C)  TempSrc: Temporal  SpO2: 98%  Weight: 159 lb (72.1 kg)  Height: 5\' 10"  (1.778 m)     Body mass index is 22.81 kg/m.  Physical Exam:   Physical Exam Vitals and nursing note reviewed.  Constitutional:      General: He is not in acute distress.    Appearance: He is well-developed. He is not ill-appearing or toxic-appearing.  Cardiovascular:     Rate and Rhythm: Normal rate and regular rhythm.     Pulses: Normal pulses.     Heart sounds: Normal heart sounds, S1 normal and S2 normal.     Comments: No LE edema Pulmonary:     Effort: Pulmonary effort is normal.     Breath sounds: Normal breath sounds.  Skin:    General: Skin is warm and dry.  Neurological:     Mental Status: He is alert.     GCS: GCS eye subscore is 4. GCS verbal subscore is 5. GCS motor subscore is 6.  Psychiatric:        Speech: Speech normal.        Behavior: Behavior normal. Behavior is cooperative.     Assessment and Plan:   Russell Russell was seen today for hypertension.  Diagnoses and all orders for this visit:  Essential hypertension Asymptomatic. Stop Lisinopril 5 mg. Advised patient to cut Metoprolol in half, so  recommend patient take 12.5 mg in AM and 12.5 mg in PM. Blood pressure log provided. Encouraged patient to drink at least one large glass of water prior to/with each Ensure, as I suspect that this is due to dehydration. Follow-up with new cardiology appt in 2 days.  Cerebrovascular accident (CVA) due to embolism of cerebral artery [facility]) Doing very well with physical therapy. Encouraged  continued attendance of appointments and abstinence of ETOH/cigarettes.  . Reviewed expectations re: course of current medical issues. . Discussed self-management of symptoms. . Outlined signs and symptoms indicating need for more acute intervention. . Patient verbalized understanding and all questions were answered. . See orders for this visit as documented in the electronic medical record. . Patient received an After-Visit Summary.  Inda Coke, PA-C

## 2019-08-18 NOTE — Telephone Encounter (Signed)
Scheduled appt for pt 

## 2019-08-19 NOTE — Progress Notes (Signed)
Cardiology Office Note Date:  08/20/2019  Patient ID:  Russell Russell, DOB Jun 10, 1950, MRN [id number] PCP:  Inda Coke, PA  EP:  New last hospitalization, Dr. Rayann Heman    Chief Complaint:  post hospital, loop implant   History of Present Illness: Russell Russell is a 69 y.o. male with history of HTN, HLD, tobacco and ETOH abuse and most recently stroke (Multiple scattered anterior and posterior R brain infarcts embolic secondary to unclear source)>> loop.  Hospitalized April with cryptogenic stroke underwent loop implant.  Discharged to [facility] 07/07/19.  He developed fever I rehab evaluated by ID, signed off with no evidence of bacterial process, off antibiotics.  Discharged from [facility] 08/05/2019.  He comes today accompanied by his wife, he is in a wheelchair, remains with L sided weakness.  Starts out patient rehab tomorrow. He denies any kind of CP, palpitations, no cardiac awareness,  No SOB or DOE. No dizzy spells, near syncope or syncope. His wife mentions the PMD stopped one of his BP medicines recently with low BP reading.  They have his home transmitter plugged in at home at the bedside.   Device information MDT loop implanted 07/03/2019, cryptogenic stroke   Past Medical History:  Diagnosis Date  . Alcohol abuse    drinks 1 gallon of Gin every weekend, nothing during the week x >15 yrs  . Elevated transaminase level    +elev bili: abd u/s 06/2014 showed stable small hepatic hemangiomas, o/w normal.  . Essential hypertension   . GERD (gastroesophageal reflux disease)   . Hyperlipidemia 03/2014   Atorv started-chol improved  . Impaired fasting glucose 03/2014  . Nephrolithiasis   . PUD (peptic ulcer disease)   . Stroke ([place])   . Tobacco dependence    chantix: "psych effects"    Past Surgical History:  Procedure Laterality Date  . COLONOSCOPY  2005   Recall 10 yrs ([place])  . LOOP RECORDER INSERTION N/A 07/03/2019   Procedure: LOOP RECORDER INSERTION;  Surgeon:  Thompson Grayer, MD;  Location: [facility];  Service: Cardiovascular;  Laterality: N/A;  . removal of kidney stones  2006   cystoscopic--removed from ureter.  No prob since.    Current Outpatient Medications  Medication Sig Dispense Refill  . acetaminophen (TYLENOL) 325 MG tablet Take 2 tablets (650 mg total) by mouth every 4 (four) hours as needed for mild pain (or temp > 37.5 C (99.5 F)).    Marland Kitchen aspirin 325 MG tablet Take 1 tablet (325 mg total) by mouth daily.    Marland Kitchen atorvastatin (LIPITOR) 40 MG tablet Take 1 tablet (40 mg total) by mouth daily. 90 tablet 1  . clopidogrel (PLAVIX) 75 MG tablet Take 1 tablet (75 mg total) by mouth daily. Continue for 3 months then stop. 30 tablet 1  . folic acid (FOLVITE) 1 MG tablet Take 1 tablet (1 mg total) by mouth daily. 30 tablet 3  . halobetasol (ULTRAVATE) 0.05 % cream Apply topically 2 (two) times daily. 50 g 3  . metoprolol tartrate (LOPRESSOR) 25 MG tablet Take 1 tablet (25 mg total) by mouth 2 (two) times daily. (Patient taking differently: Take 12.5 mg by mouth 2 (two) times daily. ) 60 tablet 0  . Multiple Vitamin (MULTIVITAMIN WITH MINERALS) TABS tablet Take 1 tablet by mouth daily.    . nicotine (NICODERM CQ - DOSED IN MG/24 HOURS) 14 mg/24hr patch 14 mg patch daily x3 weeks then 7 mg patch daily x3 weeks and stop 28 patch 0  .  omeprazole (PRILOSEC) 20 MG capsule Take 20 mg by mouth as needed (acid reflux).     No current facility-administered medications for this visit.    Allergies:   Penicillins   Social History:  The patient  reports that he has quit smoking. His smoking use included cigarettes. He has a 10.00 pack-year smoking history. He has never used smokeless tobacco. He reports current alcohol use of about 8.0 standard drinks of alcohol per week. He reports that he does not use drugs.   Family History:  The patient's family history includes Breast cancer in his sister and sister; Cancer in his father; Colon cancer in his  mother.  ROS:  Please see the history of present illness.    All other systems are reviewed and otherwise negative.   PHYSICAL EXAM:  VS:  BP 114/80   Pulse 77   Ht 5\' 10"  (1.778 m)   Wt 159 lb (72.1 kg)   SpO2 95%   BMI 22.81 kg/m  BMI: Body mass index is 22.81 kg/m. Well nourished, well developed, though thin, in no acute distress  HEENT: normocephalic, atraumatic  Neck: no JVD, carotid bruits or masses Cardiac:  RRR; no significant murmurs, no rubs, or gallops Lungs:  CTA b/l, no wheezing, rhonchi or rales  Abd: soft, nontender MS: no deformity or atrophy Ext: no edema  Skin: warm and dry, no rash Neuro: Left sided weakness Psych: euthymic mood, full affect  ILR site is well healed, stable, no tethering or discomfort   EKG:  Not done today   ILR interrogation done today and reviewed by myself:  Battery is good No arrhythmias/AF to date    07/24/2019 TTE  IMPRESSIONS  1. Left ventricular ejection fraction, by estimation, is 60 to 65%. The  left ventricle has normal function. The left ventricle has no regional  wall motion abnormalities. There is moderate left ventricular hypertrophy  of the basal-septal segment. Left  ventricular diastolic parameters were normal.  2. Right ventricular systolic function is normal. The right ventricular  size is normal. There is mildly elevated pulmonary artery systolic  pressure. The estimated right ventricular systolic pressure is 70.0 mmHg.  3. The mitral valve is normal in structure. Trivial mitral valve  regurgitation. No evidence of mitral stenosis.  4. The aortic valve is normal in structure. Aortic valve regurgitation is  not visualized. No aortic stenosis is present.  5. The inferior vena cava is dilated in size with <50% respiratory  variability, suggesting right atrial pressure of 15 mmHg.   Conclusion(s)/Recommendation(s): No evidence of valvular vegetations on  this transthoracic echocardiogram. Would recommend  a transesophageal  echocardiogram to exclude infective endocarditis if clinically indicated.    07/01/2019: TTE IMPRESSIONS  1. Very poor study. All views basically obtained from the subcostal  views.  2. Left ventricular ejection fraction, by estimation, is 55 to 60%. The  left ventricle has normal function. The left ventricle has no regional  wall motion abnormalities. Left ventricular diastolic function could not  be evaluated.  3. Right ventricular systolic function is normal. The right ventricular  size is normal.  4. The mitral valve is grossly normal. No evidence of mitral valve  regurgitation. No evidence of mitral stenosis.  5. The aortic valve is grossly normal. Aortic valve regurgitation is not  visualized. No aortic stenosis is present.  6. The inferior vena cava is normal in size with greater than 50%  respiratory variability, suggesting right atrial pressure of 3 mmHg.  7. Agitated saline  contrast bubble study was negative, with no evidence  of any interatrial shunt.   Conclusion(s)/Recommendation(s): No intracardiac source of embolism  detected on this transthoracic study. A transesophageal echocardiogram is  recommended to exclude cardiac source of embolism if clinically indicated.    Recent Labs: 07/01/2019: Magnesium 1.7; TSH 2.209 07/08/2019: ALT 19 07/31/2019: Hemoglobin 14.6; Platelets 375 08/04/2019: BUN 15; Creatinine, Ser 1.02; Potassium 4.3; Sodium 140  07/01/2019: Cholesterol 196; HDL 77; LDL Cholesterol 109; Total CHOL/HDL Ratio 2.5; Triglycerides 52; VLDL 10   Estimated Creatinine Clearance: 71.7 mL/min (by C-G formula based on SCr of 1.02 mg/dL).   Wt Readings from Last 3 Encounters:  08/20/19 159 lb (72.1 kg)  08/18/19 159 lb (72.1 kg)  08/17/19 159 lb (72.1 kg)     Other studies reviewed: Additional studies/records reviewed today include: summarized above  ASSESSMENT AND PLAN:  1. Cryptogenic stroke     Loop implanted     Site is well  healed     No AF so far  2. HTN      Relative hypotension reported with recent adjustment by his PMD     Looks OK     Continue management with the PMD   Disposition: F/u with monthly ILR remotes, we will see him otherwise PRN  Current medicines are reviewed at length with the patient today.  The patient did not have any concerns regarding medicines.  Venetia Night, PA-C 08/20/2019 11:30 AM     [facility] [place] [place] [address] [phone] (office)  [phone] (fax)

## 2019-08-20 ENCOUNTER — Other Ambulatory Visit: Payer: Self-pay

## 2019-08-20 ENCOUNTER — Encounter: Payer: Self-pay | Admitting: Physician Assistant

## 2019-08-20 ENCOUNTER — Ambulatory Visit (INDEPENDENT_AMBULATORY_CARE_PROVIDER_SITE_OTHER): Payer: BC Managed Care – PPO | Admitting: Physician Assistant

## 2019-08-20 VITALS — BP 114/80 | HR 77 | Ht 70.0 in | Wt 159.0 lb

## 2019-08-20 DIAGNOSIS — I639 Cerebral infarction, unspecified: Secondary | ICD-10-CM | POA: Diagnosis not present

## 2019-08-20 DIAGNOSIS — I10 Essential (primary) hypertension: Secondary | ICD-10-CM | POA: Diagnosis not present

## 2019-08-20 DIAGNOSIS — Z4509 Encounter for adjustment and management of other cardiac device: Secondary | ICD-10-CM | POA: Diagnosis not present

## 2019-08-20 NOTE — Patient Instructions (Signed)
Medication Instructions:   *If you need a refill on your cardiac medications before your next appointment, please call your pharmacy*   Lab Work: NONE ORDERED  TODAY   If you have labs (blood work) drawn today and your tests are completely normal, you will receive your results only by: Marland Kitchen MyChart Message (if you have MyChart) OR . A paper copy in the mail If you have any lab test that is abnormal or we need to change your treatment, we will call you to review the results.   Testing/Procedures: NONE ORDERED  TODAY    Follow-Up: At [facility], you and your health needs are our priority.  As part of our continuing mission to provide you with exceptional heart care, we have created designated Provider Care Teams.  These Care Teams include your primary Cardiologist (physician) and Advanced Practice Providers (APPs -  Physician Assistants and Nurse Practitioners) who all work together to provide you with the care you need, when you need it.  We recommend signing up for the patient portal called "MyChart".  Sign up information is provided on this After Visit Summary.  MyChart is used to connect with patients for Virtual Visits (Telemedicine).  Patients are able to view lab/test results, encounter notes, upcoming appointments, etc.  Non-urgent messages can be sent to your provider as well.   To learn more about what you can do with MyChart, go to [email or web address].    Your next appointment:  CONTACT [facility] [phone] AS NEEDED FOR  ANY CARDIAC RELATED SYMPTOMS   Other Instructions

## 2019-08-21 ENCOUNTER — Ambulatory Visit: Payer: BC Managed Care – PPO

## 2019-08-21 DIAGNOSIS — I69352 Hemiplegia and hemiparesis following cerebral infarction affecting left dominant side: Secondary | ICD-10-CM

## 2019-08-21 DIAGNOSIS — I6381 Other cerebral infarction due to occlusion or stenosis of small artery: Secondary | ICD-10-CM

## 2019-08-21 DIAGNOSIS — R471 Dysarthria and anarthria: Secondary | ICD-10-CM

## 2019-08-21 DIAGNOSIS — R2689 Other abnormalities of gait and mobility: Secondary | ICD-10-CM

## 2019-08-21 DIAGNOSIS — R2681 Unsteadiness on feet: Secondary | ICD-10-CM

## 2019-08-21 DIAGNOSIS — R41841 Cognitive communication deficit: Secondary | ICD-10-CM

## 2019-08-21 DIAGNOSIS — M6281 Muscle weakness (generalized): Secondary | ICD-10-CM

## 2019-08-21 NOTE — Therapy (Addendum)
[facility] [address] [place], [name], [address] Phone: [phone]   Fax:  [phone]  Physical Therapy Treatment  Patient Details  Name: Russell Russell MRN: [id number] Date of Birth: 1950/06/14 Referring Provider (PT): Lauraine Rinne, PA-C   Encounter Date: 08/21/2019   PT End of Session - 08/21/19 0929    Visit Number 4    Number of Visits 17    Date for PT Re-Evaluation 11/04/19    Authorization Type BCBS (follow medicare guidelines, 10th visit PN)    Progress Note Due on Visit 10    PT Start Time 0845    PT Stop Time 0935    PT Time Calculation (min) 50 min    Equipment Utilized During Treatment Gait belt    Activity Tolerance Patient tolerated treatment well    Behavior During Therapy WFL for tasks assessed/performed           Past Medical History:  Diagnosis Date   Alcohol abuse    drinks 1 gallon of Gin every weekend, nothing during the week x >15 yrs   Elevated transaminase level    +elev bili: abd u/s 06/2014 showed stable small hepatic hemangiomas, o/w normal.   Essential hypertension    GERD (gastroesophageal reflux disease)    Hyperlipidemia 03/2014   Atorv started-chol improved   Impaired fasting glucose 03/2014   Nephrolithiasis    PUD (peptic ulcer disease)    Stroke ([place])    Tobacco dependence    chantix: "psych effects"    Past Surgical History:  Procedure Laterality Date   COLONOSCOPY  2005   Recall 10 yrs ([place])   LOOP RECORDER INSERTION N/A 07/03/2019   Procedure: LOOP RECORDER INSERTION;  Surgeon: [place] Grayer, MD;  Location: [facility];  Service: Cardiovascular;  Laterality: N/A;   removal of kidney stones  2006   cystoscopic--removed from ureter.  No prob since.    There were no vitals filed for this visit.   Subjective Assessment - 08/21/19 0900    Subjective no new changes    Pertinent History PMH: HTN, alcohol abuse (ongoing use), HLD,  GERD, PUD, tobacco use    Limitations Standing;Walking;House hold activities    Patient Stated Goals return to prior level of function, walk, "wheelchair is not apart of me" (wants to get out of the w/c)                        TherEx:  SLR: 10x R and L SL clamshells: x 10 R and L SL hip abduction:  x 10 R and L Prone knee flexion: x 10 SL hip adduction: 10x R and L Bridge: 5x bil, 2 x 5 unil  R and L   Theractivity:  Gait training in parallel bars: 6 x 10 laps bil HHA with 180 deg turns, CGA only oday, cues for picking up left leg With 1 HHA in parallel bars: 3 x 10 feet, CGA Wheelchair propulsion: left arm and L leg: 30 feet min A Wheelchair management: applying brakes with left leg, moving leg rest in and out, putting foot plate down with left LE Ambulating with RW: min A 30 feet      Access Code: [email or web address] URL: [email or web address] Date: 08/13/2019 Prepared by: Markus Jarvis  Exercises Supine Active Straight Leg Raise - 2 x daily - 7 x weekly - 2 sets - 10 reps Supine Heel Slide - 2 x daily - 7 x weekly -  2 sets - 10 reps Supine Bridge - 2 x daily - 7 x weekly - 2 sets - 10 reps Supine Hip Adduction Isometric with Ball - 2 x daily - 7 x weekly - 1 sets - 10 reps - 10 hold Sidelying Hip Abduction - 2 x daily - 7 x weekly - 2 sets - 10 reps Clamshell - 2 x daily - 7 x weekly - 2 sets - 10 reps  Access Code: [email or web address] URL: [email or web address] Date: 08/13/2019 Prepared by: Markus Jarvis  Exercises Seated Long Arc Quad - 2 x daily - 7 x weekly - 2 sets - 10 reps Seated March - 2 x daily - 7 x weekly - 2 sets - 10 reps Seated Toe Raise - 2 x daily - 7 x weekly - 2 sets - 10 reps Seated Heel Raise - 2 x daily - 7 x weekly - 2 sets - 10 reps                     PT Short Term Goals - 08/06/19 2054      PT SHORT TERM GOAL #1   Title Patient will be independent with Initial HEP, with wifes assistance    Time 4     Period Weeks    Status New    Target Date 09/03/19      PT SHORT TERM GOAL #2   Title Patient will demonstrate ability to complete stand static w/ LRAD and midline alignment x 4 minutes w/o LOB to demonstrate improved tolerance for ADL tasks    Baseline <1 min w/ RW    Time 4    Period Weeks    Status New    Target Date 09/03/19      PT SHORT TERM GOAL #3   Title Patient will demo ability to complete stand pivot transfer w/ LRAD and Min A for improved functional mobility    Baseline Mod A required    Time 4    Period Weeks    Status New    Target Date 09/03/19      PT SHORT TERM GOAL #4   Title Patient will demo ability to ambulate 25 feet w/ LRAD and Min A for improved mobility    Baseline not yet assessed    Time 4    Period Weeks    Status New    Target Date 09/03/19             PT Long Term Goals - 08/06/19 2057      PT LONG TERM GOAL #1   Title Patient will be independent with Final HEP, with wifes assistance    Time 8    Period Weeks    Status New    Target Date 10/01/19      PT LONG TERM GOAL #2   Title Patient will demonstrate ability to complete transfer from w/c <> mat w/ CGA to demo improved independence with transfers.    Baseline Mod A    Time 8    Period Weeks    Status New    Target Date 10/01/19      PT LONG TERM GOAL #3   Title Patient will demonstrate ability to ambulate 50 ft w/ LRAD and min A to demonstrate improved household mobility    Time 8    Period Weeks    Status New    Target Date 10/01/19      PT LONG TERM GOAL #4   Title  Patient will demonstrate ability to complete sit <> stand Mod I to demonstrate improved functional mobility    Time 8    Period Weeks    Status New    Target Date 10/01/19              08/21/19 0942  Plan  Clinical Impression Statement Noted improved ability to pick up L LE with chair to mat transfers, noted improved foot clearance on L LE with swing phase. Pt able to progress from 2 HHA on  paralle bars to 1 HHA on paralle bar with gait training> pt able to ambulate with rolling walker for about 30 feet. Patient demonstrated improved ability to manage wheelchair parts today compared to last session and required lesser assistance with propulsion  Personal Factors and Comorbidities Comorbidity 3+  Comorbidities HTN, alcohol abuse (ongoing use), HLD, GERD, PUD, tobacco use  Examination-Activity Limitations Bed Mobility;Dressing;Locomotion Level;Stairs;Stand;Toileting;Transfers;Bathing  Examination-Participation Restrictions Driving;Interpersonal Relationship;Yard Work  Pt will benefit from skilled therapeutic intervention in order to improve on the following deficits Abnormal gait;Decreased coordination;Difficulty walking;Impaired tone;Decreased safety awareness;Decreased endurance;Decreased activity tolerance;Pain;Decreased balance;Decreased knowledge of use of DME;Postural dysfunction;Impaired sensation;Decreased strength;Decreased mobility  Stability/Clinical Decision Making Evolving/Moderate complexity  Rehab Potential Good  PT Frequency 2x / week  PT Duration 8 weeks  PT Treatment/Interventions ADLs/Self Care Home Management;Electrical Stimulation;DME Instruction;Moist Heat;Cryotherapy;Gait training;Stair training;Functional mobility training;Therapeutic activities;Therapeutic exercise;Balance training;Neuromuscular re-education;Patient/family education;Orthotic Fit/Training;Wheelchair mobility training;Passive range of motion         Patient will benefit from skilled therapeutic intervention in order to improve the following deficits and impairments:     Visit Diagnosis: Cerebrovascular accident (CVA) of right basal ganglia (HCC)  Unsteadiness on feet  Muscle weakness (generalized)  Other abnormalities of gait and mobility  Hemiplegia and hemiparesis following cerebral infarction affecting left dominant side [facility])     Problem List Patient Active Problem List    Diagnosis Date Noted   AKI (acute kidney injury) ([place])    Fever    Sepsis without acute organ dysfunction (HCC)    Elevated BUN    Blood pressure increase diastolic    Labile blood pressure    Leukopenia    Benign essential HTN    Hypoalbuminemia    Cerebrovascular accident (CVA) of right basal ganglia ([place]) 07/07/2019   Acute ischemic stroke ([place])    Dyslipidemia    Dysphagia, post-stroke    Polycythemia    Stroke ([place]) 06/30/2019   Prediabetes 12/23/2016   Elevated LFTs 12/23/2016   Pure hypercholesterolemia 12/21/2016   Alcoholism ([place]) 12/21/2016   Eczema 09/18/2013   Essential hypertension 09/18/2013   Gastroesophageal reflux disease without esophagitis 09/18/2013   Tobacco abuse 09/18/2013    Kerrie Pleasure 08/21/2019, 9:44 AM  [facility] [address] [place], [name], [address] Phone: [phone]   Fax:  [phone]  Name: Russell Russell MRN: [id number] Date of Birth: 12-03-1950

## 2019-08-21 NOTE — Therapy (Signed)
[facility] [address] [place], [name], [address] Phone: [phone]   Fax:  [phone]  Speech Language Pathology Treatment  Patient Details  Name: Russell Russell MRN: [id number] Date of Birth: June 20, 1950 Referring Provider (SLP): Stann Mainland, [place]   Encounter Date: 08/21/2019   End of Session - 08/21/19 2306    Visit Number 3    Number of Visits 17    Date for SLP Re-Evaluation 11/05/19    SLP Start Time 0803    SLP Stop Time  0845    SLP Time Calculation (min) 42 min    Activity Tolerance Patient tolerated treatment well           Past Medical History:  Diagnosis Date  . Alcohol abuse    drinks 1 gallon of Gin every weekend, nothing during the week x >15 yrs  . Elevated transaminase level    +elev bili: abd u/s 06/2014 showed stable small hepatic hemangiomas, o/w normal.  . Essential hypertension   . GERD (gastroesophageal reflux disease)   . Hyperlipidemia 03/2014   Atorv started-chol improved  . Impaired fasting glucose 03/2014  . Nephrolithiasis   . PUD (peptic ulcer disease)   . Stroke ([place])   . Tobacco dependence    chantix: "psych effects"    Past Surgical History:  Procedure Laterality Date  . COLONOSCOPY  2005   Recall 10 yrs ([place])  . LOOP RECORDER INSERTION N/A 07/03/2019   Procedure: LOOP RECORDER INSERTION;  Surgeon: [place] Grayer, MD;  Location: [facility];  Service: Cardiovascular;  Laterality: N/A;  . removal of kidney stones  2006   cystoscopic--removed from ureter.  No prob since.    There were no vitals filed for this visit.   Subjective Assessment - 08/21/19 0811    Subjective Pt req'd cue from wife to tell SLP what last ST session included.    Patient is accompained by: Family member   wanda - wife   Currently in Pain? No/denies                 ADULT SLP TREATMENT - 08/21/19 0812      General Information   Behavior/Cognition  Alert;Cooperative;Pleasant mood      Treatment Provided   Treatment provided Cognitive-Linquistic      Cognitive-Linquistic Treatment   Treatment focused on Cognition    Skilled Treatment Pt req'd mod cues from wife occasionally for details from previous session. SLP suggested to pt/wife to have pt set time for his chore and then set an alarm for that time to alert pt to it. Pt req'd consistent min-mod questioning cues for intellectual awareness for things other than "not driving". He req'd fading cues from consistent max to rare min for how long it had been since his CVA. Pt req'd checkboxes for EMST reps, consistent cues for counting 5 instead of 6 or 7 reps. SLP told pt to do two sets of 25 twice a day.       Assessment / Recommendations / Plan   Plan Continue with current plan of care      Progression Toward Goals   Progression toward goals Progressing toward goals            SLP Education - 08/21/19 2305    Education Details deficit areas, EMST procedure, set a timer for your chore each day    Person(s) Educated Patient;Spouse    Methods Explanation;Demonstration    Comprehension Verbalized understanding;Need further instruction;Verbal cues  required            SLP Short Term Goals - 08/18/19 8841      SLP SHORT TERM GOAL #1   Title pt will demo the knowledge that he can use a memory system for daily events, ID'ing prospective and past events, medication management, and appoitmetn tracking, etc x 3 sessions    Time 3    Period Weeks   or 9 total visits, for all STGs   Status On-going      SLP SHORT TERM GOAL #2   Title pt will demo selective atteniton for 5 mintues to perform simple cognitive lingiustic tasks in a min-mod noisy environment x2 sessions    Time 3    Period Weeks    Status On-going      SLP SHORT TERM GOAL #3   Title pt will tell SLP 2 congitive deficits affecting his life at home post-CVA in two sessions    Time 3    Period Weeks    Status On-going        SLP SHORT TERM GOAL #4   Title pt will incr expiratory muscle strength taken in first 2 ST sessions via spirometer    Baseline MEP 45 on 08/13/19    Time 3    Period Weeks    Status On-going            SLP Long Term Goals - 08/18/19 0916      SLP LONG TERM GOAL #1   Title pt will demo simple conversation of 5 mintues with average mid 60s dB x 3 sessions    Time 7    Period Weeks   or 17 sessions   Status On-going      SLP LONG TERM GOAL #2   Title pt will demo selective atteniton for 10 mintues to perform simple cognitive lingiustic tasks in a min-mod noisy environment x3 sessions    Time 7    Period Weeks    Status On-going      SLP LONG TERM GOAL #3   Title pt will demo WFL alternating attention in simple cognitve linguistic tasks functional for pt in 3 sessions    Time 7    Period Weeks    Status On-going      SLP LONG TERM GOAL #4   Title pt will independnently access memory notebook/planner for appoitnment management, to-do-lists, recall of prospective and past events, etc in 3 sessions    Time 7    Period Weeks    Status On-going            Plan - 08/21/19 2307    Clinical Impression Statement Pt presents today with significant cognitive communication deficits - Cogntive Linguistic Quick Test was initiated today and will be completed next session or two sessions. Pt's deficits in these areas will affect his ability to be independent and at their current level will require consistent assistance from wife/family for most needs. Pt also presents with reduced voice volume (low 60s dB most of the time), affecting intelligibility with wife at home. SLP recommends skilled ST to address cognitive linguistics and dysarthria c/b reduced voice volume.    Speech Therapy Frequency 2x / week    Duration --   8 weeks or 17 total visits   Treatment/Interventions Environmental controls;Other (comment);Compensatory techniques;Functional tasks;SLP instruction and feedback;Cueing  hierarchy;Cognitive reorganization;Patient/family education;Internal/external aids   EMST   Potential to Achieve Goals Fair    Potential Considerations Severity of impairments;Ability to  learn/carryover information    Consulted and Agree with Plan of Care Patient;Family member/caregiver    Family Member Consulted wife           Patient will benefit from skilled therapeutic intervention in order to improve the following deficits and impairments:   Cognitive communication deficit  Dysarthria and anarthria    Problem List Patient Active Problem List   Diagnosis Date Noted  . AKI (acute kidney injury) ([place])   . Fever   . Sepsis without acute organ dysfunction ([place])   . Elevated BUN   . Blood pressure increase diastolic   . Labile blood pressure   . Leukopenia   . Benign essential HTN   . Hypoalbuminemia   . Cerebrovascular accident (CVA) of right basal ganglia ([place]) 07/07/2019  . Acute ischemic stroke ([place])   . Dyslipidemia   . Dysphagia, post-stroke   . Polycythemia   . Stroke ([place]) 06/30/2019  . Prediabetes 12/23/2016  . Elevated LFTs 12/23/2016  . Pure hypercholesterolemia 12/21/2016  . Alcoholism ([place]) 12/21/2016  . Eczema 09/18/2013  . Essential hypertension 09/18/2013  . Gastroesophageal reflux disease without esophagitis 09/18/2013  . Tobacco abuse 09/18/2013    [facility] ,MS, CCC-SLP  08/21/2019, 11:10 PM  [facility] [address] [place], [name], [address] Phone: [phone]   Fax:  [phone]   Name: Jermari Tamargo MRN: [id number] Date of Birth: [birth year]

## 2019-08-21 NOTE — Patient Instructions (Signed)
  TWO SETS OF 25 blows each day Keep track with your checklist

## 2019-08-24 ENCOUNTER — Ambulatory Visit: Payer: BC Managed Care – PPO | Admitting: Rehabilitation

## 2019-08-24 ENCOUNTER — Encounter: Payer: Self-pay | Admitting: Rehabilitation

## 2019-08-24 ENCOUNTER — Other Ambulatory Visit: Payer: Self-pay

## 2019-08-24 ENCOUNTER — Ambulatory Visit: Payer: BC Managed Care – PPO

## 2019-08-24 DIAGNOSIS — R2681 Unsteadiness on feet: Secondary | ICD-10-CM | POA: Diagnosis not present

## 2019-08-24 DIAGNOSIS — R2689 Other abnormalities of gait and mobility: Secondary | ICD-10-CM

## 2019-08-24 DIAGNOSIS — R41841 Cognitive communication deficit: Secondary | ICD-10-CM

## 2019-08-24 DIAGNOSIS — R471 Dysarthria and anarthria: Secondary | ICD-10-CM

## 2019-08-24 DIAGNOSIS — I69352 Hemiplegia and hemiparesis following cerebral infarction affecting left dominant side: Secondary | ICD-10-CM

## 2019-08-24 DIAGNOSIS — M6281 Muscle weakness (generalized): Secondary | ICD-10-CM

## 2019-08-24 NOTE — Therapy (Signed)
[facility] [address] [place], [name], [address] Phone: [phone]   Fax:  [phone]  Physical Therapy Treatment  Patient Details  Name: Russell Russell MRN: [id number] Date of Birth: [birth year] Referring Provider (PT): Lauraine Rinne, PA-C   Encounter Date: 08/24/2019   PT End of Session - 08/24/19 1250    Visit Number 5    Number of Visits 17    Date for PT Re-Evaluation 11/04/19    Authorization Type BCBS (follow medicare guidelines, 10th visit PN)    Progress Note Due on Visit 10    PT Start Time 1025    PT Stop Time 1100    PT Time Calculation (min) 35 min    Equipment Utilized During Treatment Gait belt    Activity Tolerance Patient tolerated treatment well    Behavior During Therapy WFL for tasks assessed/performed           Past Medical History:  Diagnosis Date  . Alcohol abuse    drinks 1 gallon of Gin every weekend, nothing during the week x >15 yrs  . Elevated transaminase level    +elev bili: abd u/s 06/2014 showed stable small hepatic hemangiomas, o/w normal.  . Essential hypertension   . GERD (gastroesophageal reflux disease)   . Hyperlipidemia 03/2014   Atorv started-chol improved  . Impaired fasting glucose 03/2014  . Nephrolithiasis   . PUD (peptic ulcer disease)   . Stroke ([place])   . Tobacco dependence    chantix: "psych effects"    Past Surgical History:  Procedure Laterality Date  . COLONOSCOPY  2005   Recall 10 yrs ([place])  . LOOP RECORDER INSERTION N/A 07/03/2019   Procedure: LOOP RECORDER INSERTION;  Surgeon: Thompson Grayer, MD;  Location: [facility];  Service: Cardiovascular;  Laterality: N/A;  . removal of kidney stones  2006   cystoscopic--removed from ureter.  No prob since.    There were no vitals filed for this visit.   Subjective Assessment - 08/24/19 1027    Subjective No changes since last visit.    Currently in Pain? No/denies                              [facility] Adult PT Treatment/Exercise - 08/24/19 1030      Transfers   Transfers Sit to Stand;Stand to [employer] Transfers    Sit to Stand 4: Min assist    Sit to Stand Details Tactile cues for placement;Verbal cues for technique;Verbal cues for sequencing;Manual facilitation for placement;Manual facilitation for weight bearing    Sit to Stand Details (indicate cue type and reason) Min cues for hand placement and verbal/tactile cues for increased R lateral weight shift to decerease pusher tendencies during transfer.  Performed at counter top x 2 reps and from mat at min A level.  Min facilitation for forward and R lateral weight shift.      Stand to Sit 4: Min assist    Stand to Sit Details (indicate cue type and reason) Tactile cues for sequencing;Tactile cues for weight shifting;Verbal cues for technique    Stand to Sit Details Min cues for hand placement    Stand Pivot Transfers 4: Min assist;3: Mod assist    Stand Pivot Transfer Details (indicate cue type and reason) Had pt bring his UEs to PTs shoulder for more upright posture.  Tactile and vebral cues for lateral weight shifting and stepping sequence as  he has difficulty moving LLE due to pushing.       Ambulation/Gait   Ambulation/Gait Yes    Ambulation/Gait Assistance 4: Min assist    Ambulation/Gait Assistance Details Pt ambulatory with RW during session with PT initially being on pts left side, however due to pusher tendencies, PT was able to provide better visual and tactile cues from [name] side.  Had pt slow down to emphasize forward/R lateral weight shift prior to advancing LLE.  Note that pt does very well with this cuing with min tactile cues at chest for upright posture.  Cues for larger L step (which he did well with with adequate R weight shift.  Note that with fatigue, pt did need cuing for increased L knee control with stance.      Ambulation Distance (Feet) 33 Feet    Assistive device  Rolling walker    Gait Pattern Step-through pattern;Step-to pattern;Decreased stride length;Decreased stance time - right;Decreased step length - left    Ambulation Surface Level;Indoor      Neuro Re-ed    Neuro Re-ed Details  NMR in standing to address improved postural control, decrease pusher tendencies and improve LLE activation.  Performed standing task reaching for clothes pins (up and far to the R) with use of mirror for visual feedback.  Pt needs tactile cues to shift hips more to the R, but did very well.  Note he also needs cuing to return to midline rather than to the L during task.  Attempted to progress to performing mid range task with squat reach and then placing clothes pin as before, however this was very difficult and pt with increased pushing and trunk rotation, therefore worked on just straight down squat with return to upright posture x 10 reps then had pt squat to retrieve clothes pin in front of him and place upward to the R as stated before.  Pt reports they are not doing any standing activity at home, therefore assisted pt back to w/c and assisted to sink to perform standing reaching task.  PT performed with pt, placing target paper on cabinet to reach for, holding for 5 secs then return to midline.  PT assisted on the L and then had wife return demo.  Educated to perform 2-3 times per day for up to 5 mins.  Both verbalized understanding.                    PT Education - 08/24/19 1250    Education Details standing reaching at home at kitchen sink.    Person(s) Educated Patient;Spouse    Methods Explanation;Demonstration    Comprehension Verbalized understanding;Returned demonstration            PT Short Term Goals - 08/06/19 2054      PT SHORT TERM GOAL #1   Title Patient will be independent with Initial HEP, with wifes assistance    Time 4    Period Weeks    Status New    Target Date 09/03/19      PT SHORT TERM GOAL #2   Title Patient will demonstrate  ability to complete stand static w/ LRAD and midline alignment x 4 minutes w/o LOB to demonstrate improved tolerance for ADL tasks    Baseline <1 min w/ RW    Time 4    Period Weeks    Status New    Target Date 09/03/19      PT SHORT TERM GOAL #3   Title Patient  will demo ability to complete stand pivot transfer w/ LRAD and Min A for improved functional mobility    Baseline Mod A required    Time 4    Period Weeks    Status New    Target Date 09/03/19      PT SHORT TERM GOAL #4   Title Patient will demo ability to ambulate 25 feet w/ LRAD and Min A for improved mobility    Baseline not yet assessed    Time 4    Period Weeks    Status New    Target Date 09/03/19             PT Long Term Goals - 08/06/19 2057      PT LONG TERM GOAL #1   Title Patient will be independent with Final HEP, with wifes assistance    Time 8    Period Weeks    Status New    Target Date 10/01/19      PT LONG TERM GOAL #2   Title Patient will demonstrate ability to complete transfer from w/c <> mat w/ CGA to demo improved independence with transfers.    Baseline Mod A    Time 8    Period Weeks    Status New    Target Date 10/01/19      PT LONG TERM GOAL #3   Title Patient will demonstrate ability to ambulate 50 ft w/ LRAD and min A to demonstrate improved household mobility    Time 8    Period Weeks    Status New    Target Date 10/01/19      PT LONG TERM GOAL #4   Title Patient will demonstrate ability to complete sit <> stand Mod I to demonstrate improved functional mobility    Time 8    Period Weeks    Status New    Target Date 10/01/19                 Plan - 08/24/19 1252    Clinical Impression Statement Skilled session focused on standing dynamic activities to address postural control, LLE weight activation, and increased R lateral weight shift to decrease perceptual deficits.  Pt tolerated very well and ended with bout of gait with RW.    Personal Factors and  Comorbidities Comorbidity 3+    Comorbidities HTN, alcohol abuse (ongoing use), HLD, GERD, PUD, tobacco use    Examination-Activity Limitations Bed Mobility;Dressing;Locomotion Level;Stairs;Stand;Toileting;Transfers;Bathing    Examination-Participation Restrictions Driving;Interpersonal Relationship;Yard Work    [employer] Evolving/Moderate complexity    Rehab Potential Good    PT Frequency 2x / week    PT Duration 8 weeks    PT Treatment/Interventions ADLs/Self Care Home Management;Electrical Stimulation;DME Instruction;Moist Heat;Cryotherapy;Gait training;Stair training;Functional mobility training;Therapeutic activities;Therapeutic exercise;Balance training;Neuromuscular re-education;Patient/family education;Orthotic Fit/Training;Wheelchair mobility training;Passive range of motion    PT Next Visit Plan Gait with RW!!, NMR for improved R lateral weight shift, mid range activities, transfers, weight shifting, tall kneeling.           Patient will benefit from skilled therapeutic intervention in order to improve the following deficits and impairments:  Abnormal gait, Decreased coordination, Difficulty walking, Impaired tone, Decreased safety awareness, Decreased endurance, Decreased activity tolerance, Pain, Decreased balance, Decreased knowledge of use of DME, Postural dysfunction, Impaired sensation, Decreased strength, Decreased mobility  Visit Diagnosis: Unsteadiness on feet  Muscle weakness (generalized)  Other abnormalities of gait and mobility  Hemiplegia and hemiparesis following cerebral infarction affecting left dominant side (HCC)  Problem List Patient Active Problem List   Diagnosis Date Noted  . AKI (acute kidney injury) ([place])   . Fever   . Sepsis without acute organ dysfunction ([place])   . Elevated BUN   . Blood pressure increase diastolic   . Labile blood pressure   . Leukopenia   . Benign essential HTN   . Hypoalbuminemia   .  Cerebrovascular accident (CVA) of right basal ganglia ([place]) 07/07/2019  . Acute ischemic stroke ([place])   . Dyslipidemia   . Dysphagia, post-stroke   . Polycythemia   . Stroke ([place]) 06/30/2019  . Prediabetes 12/23/2016  . Elevated LFTs 12/23/2016  . Pure hypercholesterolemia 12/21/2016  . Alcoholism ([place]) 12/21/2016  . Eczema 09/18/2013  . Essential hypertension 09/18/2013  . Gastroesophageal reflux disease without esophagitis 09/18/2013  . Tobacco abuse 09/18/2013    Cameron Sprang, PT, MPT [facility] [address] [place], [name], [address] Phone: [phone]   Fax:  [phone] 08/24/19, 1:01 PM  Name: Tarig Zimmers MRN: [id number] Date of Birth: Oct 12, 1950

## 2019-08-24 NOTE — Therapy (Signed)
[facility] [address] [place], [name], [address] Phone: [phone]   Fax:  [phone]  Speech Language Pathology Treatment  Patient Details  Name: Russell Russell MRN: [id number] Date of Birth: 1950-03-21 Referring Provider (SLP): Stann Main[place], [place]   Encounter Date: 08/24/2019   End of Session - 08/24/19 1150    Visit Number 4    Number of Visits 17    Date for SLP Re-Evaluation 11/05/19    SLP Start Time 1150    SLP Stop Time  1230    SLP Time Calculation (min) 40 min    Activity Tolerance Patient tolerated treatment well           Past Medical History:  Diagnosis Date  . Alcohol abuse    drinks 1 gallon of Gin every weekend, nothing during the week x >15 yrs  . Elevated transaminase level    +elev bili: abd u/s 06/2014 showed stable small hepatic hemangiomas, o/w normal.  . Essential hypertension   . GERD (gastroesophageal reflux disease)   . Hyperlipidemia 03/2014   Atorv started-chol improved  . Impaired fasting glucose 03/2014  . Nephrolithiasis   . PUD (peptic ulcer disease)   . Stroke ([place])   . Tobacco dependence    chantix: "psych effects"    Past Surgical History:  Procedure Laterality Date  . COLONOSCOPY  2005   Recall 10 yrs ([place])  . LOOP RECORDER INSERTION N/A 07/03/2019   Procedure: LOOP RECORDER INSERTION;  Surgeon: Thompson Grayer, MD;  Location: [facility];  Service: Cardiovascular;  Laterality: N/A;  . removal of kidney stones  2006   cystoscopic--removed from ureter.  No prob since.    There were no vitals filed for this visit.   Subjective Assessment - 08/24/19 1108    Subjective Pt req'd cue from wife to tell SLP what last session.    Patient is accompained by: Family member   wanda- wife   Currently in Pain? No/denies                 ADULT SLP TREATMENT - 08/24/19 1112      General Information   Behavior/Cognition Alert;Cooperative;Pleasant mood       Treatment Provided   Treatment provided Cognitive-Linquistic      Cognitive-Linquistic Treatment   Treatment focused on Cognition    Skilled Treatment (Cognition 25 minutes) SLP worked on pt's attention skills with pt naming as many [place] teams as he could - req'd min A rarely for attention to name 9 teams. SLP worked with pt for 15 minutes with football tasks and pt req'd incr'd freqwuency cues the last 5 minutes. SLP reviewed pt's deficits of memory and attention with pt - pt agreed with these but did not provide them when asked.  At the end of the session, pt req'd cues to recall what he was supposed to complete BID - min  8 -+ cues necessary. (speech tx  ind) Pt told SLP he has been marking down the reps however is getting EMST in once/day. SLP told pt BID for best success. Pt told SLP that he could do one more set in the evening - SLP gave cues to pt as to how to remember to do EMST - pt stated he would use his phone. Pt made functional tracking marks although some sets were 8 and 9 reps long. SLP had to cue pt for "big breath" and "blow out the candles" usually.  Assessment / Recommendations / Plan   Plan Continue with current plan of care      Progression Toward Goals   Progression toward goals Progressing toward goals            SLP Education - 08/24/19 1150    Education Details deficit areas    Person(s) Educated Patient    Methods Explanation    Comprehension Verbalized understanding            SLP Short Term Goals - 08/24/19 1155      SLP SHORT TERM GOAL #1   Title pt will demo the knowledge that he can use a memory system for daily events, ID'ing prospective and past events, medication management, and appoitmetn tracking, etc x 3 sessions    Time 2    Period Weeks   or 9 total visits, for all STGs   Status On-going      SLP SHORT TERM GOAL #2   Title pt will demo selective atteniton for 5 mintues to perform simple cognitive lingiustic tasks in a min-mod noisy  environment x2 sessions    Time 2    Period Weeks    Status On-going      SLP SHORT TERM GOAL #3   Title pt will tell SLP 2 congitive deficits affecting his life at home post-CVA in two sessions    Time 2    Period Weeks    Status On-going      SLP SHORT TERM GOAL #4   Title pt will incr expiratory muscle strength taken in first 2 ST sessions via spirometer    Baseline MEP 45 on 08/13/19    Time 2    Period Weeks    Status On-going            SLP Long Term Goals - 08/24/19 1156      SLP LONG TERM GOAL #1   Title pt will demo simple conversation of 5 mintues with average mid 60s dB x 3 sessions    Time 6    Period Weeks   or 17 sessions   Status On-going      SLP LONG TERM GOAL #2   Title pt will demo selective atteniton for 10 mintues to perform simple cognitive lingiustic tasks in a min-mod noisy environment x3 sessions    Time 6    Period Weeks    Status On-going      SLP LONG TERM GOAL #3   Title pt will demo WFL alternating attention in simple cognitve linguistic tasks functional for pt in 3 sessions    Time 6    Period Weeks    Status On-going      SLP LONG TERM GOAL #4   Title pt will independnently access memory notebook/planner for appoitnment management, to-do-lists, recall of prospective and past events, etc in 3 sessions    Time 6    Period Weeks    Status On-going            Plan - 08/24/19 1150    Clinical Impression Statement Pt presents today with significant cognitive communication deficits. Pt's deficits in these areas will affect his ability to be independent and at their current level will require consistent assistance from wife/family for most needs. Pt also presents with reduced voice volume (low 60s dB most of the time), affecting intelligibility with wife at home. SLP recommends skilled ST to address cognitive linguistics and dysarthria c/b reduced voice volume.    Speech Therapy Frequency  2x / week    Duration --   8 weeks or 17 total  visits   Treatment/Interventions Environmental controls;Other (comment);Compensatory techniques;Functional tasks;SLP instruction and feedback;Cueing hierarchy;Cognitive reorganization;Patient/family education;Internal/external aids   EMST   Potential to Achieve Goals Fair    Potential Considerations Severity of impairments;Ability to learn/carryover information    Consulted and Agree with Plan of Care Patient;Family member/caregiver    Family Member Consulted wife           Patient will benefit from skilled therapeutic intervention in order to improve the following deficits and impairments:   Cognitive communication deficit  Dysarthria and anarthria    Problem List Patient Active Problem List   Diagnosis Date Noted  . AKI (acute kidney injury) ([place])   . Fever   . Sepsis without acute organ dysfunction ([place])   . Elevated BUN   . Blood pressure increase diastolic   . Labile blood pressure   . Leukopenia   . Benign essential HTN   . Hypoalbuminemia   . Cerebrovascular accident (CVA) of right basal ganglia ([place]) 07/07/2019  . Acute ischemic stroke ([place])   . Dyslipidemia   . Dysphagia, post-stroke   . Polycythemia   . Stroke ([place]) 06/30/2019  . Prediabetes 12/23/2016  . Elevated LFTs 12/23/2016  . Pure hypercholesterolemia 12/21/2016  . Alcoholism ([place]) 12/21/2016  . Eczema 09/18/2013  . Essential hypertension 09/18/2013  . Gastroesophageal reflux disease without esophagitis 09/18/2013  . Tobacco abuse 09/18/2013    [facility] ,[place], [place]  08/24/2019, 11:57 AM  [facility] [address] [place], [name], [address] Phone: [phone]   Fax:  [phone]   Name: Russell Russell MRN: [id number] Date of Birth: [birth year]

## 2019-09-01 ENCOUNTER — Ambulatory Visit: Payer: BC Managed Care – PPO

## 2019-09-01 ENCOUNTER — Other Ambulatory Visit: Payer: Self-pay

## 2019-09-01 DIAGNOSIS — R278 Other lack of coordination: Secondary | ICD-10-CM

## 2019-09-01 DIAGNOSIS — I69352 Hemiplegia and hemiparesis following cerebral infarction affecting left dominant side: Secondary | ICD-10-CM

## 2019-09-01 DIAGNOSIS — M6281 Muscle weakness (generalized): Secondary | ICD-10-CM

## 2019-09-01 DIAGNOSIS — R2689 Other abnormalities of gait and mobility: Secondary | ICD-10-CM

## 2019-09-01 DIAGNOSIS — R2681 Unsteadiness on feet: Secondary | ICD-10-CM

## 2019-09-01 NOTE — Therapy (Signed)
[facility] [address] [place], [name], [address] Phone: [phone]   Fax:  [phone]  Physical Therapy Treatment  Patient Details  Name: Russell Russell MRN: [id number] Date of Birth: Jul 10, 1950 Referring Provider (PT): Lauraine Rinne, PA-C   Encounter Date: 09/01/2019   PT End of Session - 09/01/19 1236    Visit Number 6    Number of Visits 17    Date for PT Re-Evaluation 11/04/19    Authorization Type BCBS (follow medicare guidelines, 10th visit PN)    Progress Note Due on Visit 10    PT Start Time 1231    PT Stop Time 1315    PT Time Calculation (min) 44 min    Equipment Utilized During Treatment Gait belt    Activity Tolerance Patient tolerated treatment well    Behavior During Therapy WFL for tasks assessed/performed           Past Medical History:  Diagnosis Date  . Alcohol abuse    drinks 1 gallon of Gin every weekend, nothing during the week x >15 yrs  . Elevated transaminase level    +elev bili: abd u/s 06/2014 showed stable small hepatic hemangiomas, o/w normal.  . Essential hypertension   . GERD (gastroesophageal reflux disease)   . Hyperlipidemia 03/2014   Atorv started-chol improved  . Impaired fasting glucose 03/2014  . Nephrolithiasis   . PUD (peptic ulcer disease)   . Stroke ([place])   . Tobacco dependence    chantix: "psych effects"    Past Surgical History:  Procedure Laterality Date  . COLONOSCOPY  2005   Recall 10 yrs ([place])  . LOOP RECORDER INSERTION N/A 07/03/2019   Procedure: LOOP RECORDER INSERTION;  Surgeon: Thompson Grayer, MD;  Location: [facility];  Service: Cardiovascular;  Laterality: N/A;  . removal of kidney stones  2006   cystoscopic--removed from ureter.  No prob since.    There were no vitals filed for this visit.   Subjective Assessment - 09/01/19 1321    Subjective Patient reports doing well. No new complaints or changes.    Pertinent History  PMH: HTN, alcohol abuse (ongoing use), HLD, GERD, PUD, tobacco use    Limitations Standing;Walking;House hold activities    Patient Stated Goals return to prior level of function, walk, "wheelchair is not apart of me" (wants to get out of the w/c)    Currently in Pain? No/denies                             [facility] Adult PT Treatment/Exercise - 09/01/19 0001      Transfers   Transfers Sit to Stand;Stand to Sit;Stand Pivot Transfers    Sit to Stand 4: Min guard;4: Min assist    Sit to Stand Details Tactile cues for placement;Verbal cues for technique;Verbal cues for sequencing;Manual facilitation for placement;Manual facilitation for weight bearing    Sit to Stand Details (indicate cue type and reason) PT providing verbal cues for hand placement. PT educating to place LUE on walker, and use RUE to push from surface.     Stand to Sit 4: Min guard    Stand to Sit Details (indicate cue type and reason) Tactile cues for sequencing;Tactile cues for weight shifting;Verbal cues for technique    Stand to Sit Details PT providing verbal cues for hand placement, to reach to surface prior to descent.     Stand Pivot Transfers 4: Min  assist;3: Mod assist    Stand Pivot Transfer Details (indicate cue type and reason) PT providing verbal cues for lateral weight shift/stepping.     Comments All sit <> stands completed with mirror placed in front for visual feedback to facilitate upright posture.       Ambulation/Gait   Ambulation/Gait Yes    Ambulation/Gait Assistance 4: Min assist    Ambulation/Gait Assistance Details PT ambulating with RW. PT providing assistance from R side due to pusher tendencies, transitioned to posterior to provide assistance to LLE and ensure proper weight shift. PT providing verbal and tactile cues for improved weight shift to allow for advancement of LLE. Patient require rest break after gait due to fatigue.     Ambulation Distance (Feet) 30 Feet    Assistive  device Rolling walker    Gait Pattern Step-through pattern;Step-to pattern;Decreased stride length;Decreased stance time - right;Decreased step length - left    Ambulation Surface Level;Indoor      Neuro Re-ed    Neuro Re-ed Details  Complete NMR standing, including alternating toe tap to colored target with RLE to promote weight shifting to LLE. PT providing manual faciliation for weight shift at pelvis. Completed 1 x 10 reps of stepping, progressed to complete another 1 x 10 reps with 3 sec hold on target to further promote LLE strenghtening. Completed with mirror placed in front for visual feedback regarding posture, overall patient responded well to all visual/tactilue cues.       Exercises   Exercises Other Exercises    Other Exercises  Completed LLE strengthening exercises on mat: including supine bridge 1 x 10 reps, followed by 1 x 10 reps with 3 sec hold. Completed L clamshell 1 x 10 reps, patient demo ease w/o resistance, PT providiing resistance with red theraband and educated to complete at home. Completed supine heel slides on LLE with towel under foot, 2 x 10 reps with focus on improved control. Increased fatigue noted after completion of exercises.                   PT Education - 09/01/19 1320    Education Details Educated to complete clamshell with Red Theraband. Also educated on options for reaching at sink including using post it notes as targets    Person(s) Educated Patient;Spouse    Methods Explanation;Demonstration    Comprehension Verbalized understanding;Returned demonstration            PT Short Term Goals - 08/06/19 2054      PT SHORT TERM GOAL #1   Title Patient will be independent with Initial HEP, with wifes assistance    Time 4    Period Weeks    Status New    Target Date 09/03/19      PT SHORT TERM GOAL #2   Title Patient will demonstrate ability to complete stand static w/ LRAD and midline alignment x 4 minutes w/o LOB to demonstrate improved  tolerance for ADL tasks    Baseline <1 min w/ RW    Time 4    Period Weeks    Status New    Target Date 09/03/19      PT SHORT TERM GOAL #3   Title Patient will demo ability to complete stand pivot transfer w/ LRAD and Min A for improved functional mobility    Baseline Mod A required    Time 4    Period Weeks    Status New    Target Date 09/03/19  PT SHORT TERM GOAL #4   Title Patient will demo ability to ambulate 25 feet w/ LRAD and Min A for improved mobility    Baseline not yet assessed    Time 4    Period Weeks    Status New    Target Date 09/03/19             PT Long Term Goals - 08/06/19 2057      PT LONG TERM GOAL #1   Title Patient will be independent with Final HEP, with wifes assistance    Time 8    Period Weeks    Status New    Target Date 10/01/19      PT LONG TERM GOAL #2   Title Patient will demonstrate ability to complete transfer from w/c <> mat w/ CGA to demo improved independence with transfers.    Baseline Mod A    Time 8    Period Weeks    Status New    Target Date 10/01/19      PT LONG TERM GOAL #3   Title Patient will demonstrate ability to ambulate 50 ft w/ LRAD and min A to demonstrate improved household mobility    Time 8    Period Weeks    Status New    Target Date 10/01/19      PT LONG TERM GOAL #4   Title Patient will demonstrate ability to complete sit <> stand Mod I to demonstrate improved functional mobility    Time 8    Period Weeks    Status New    Target Date 10/01/19                 Plan - 09/01/19 1414    Clinical Impression Statement Today's skilled PT session focused on standing activities and functional mobility training to improve posture control and weightshifting to LLE. Also completing gait training with RW, PT providing min A and faciliation through pelvis. PT updated clamshells on HEP to include red theraband.    Personal Factors and Comorbidities Comorbidity 3+    Comorbidities HTN, alcohol abuse  (ongoing use), HLD, GERD, PUD, tobacco use    Examination-Activity Limitations Bed Mobility;Dressing;Locomotion Level;Stairs;Stand;Toileting;Transfers;Bathing    Examination-Participation Restrictions Driving;Interpersonal Relationship;Yard Work    [employer] officer Evolving/Moderate complexity    Rehab Potential Good    PT Frequency 2x / week    PT Duration 8 weeks    PT Treatment/Interventions ADLs/Self Care Home Management;Electrical Stimulation;DME Instruction;Moist Heat;Cryotherapy;Gait training;Stair training;Functional mobility training;Therapeutic activities;Therapeutic exercise;Balance training;Neuromuscular re-education;Patient/family education;Orthotic Fit/Training;Wheelchair mobility training;Passive range of motion    PT Next Visit Plan Gait with RW!!, NMR for improved R lateral weight shift, mid range activities, transfers, weight shifting, tall kneeling.           Patient will benefit from skilled therapeutic intervention in order to improve the following deficits and impairments:  Abnormal gait, Decreased coordination, Difficulty walking, Impaired tone, Decreased safety awareness, Decreased endurance, Decreased activity tolerance, Pain, Decreased balance, Decreased knowledge of use of DME, Postural dysfunction, Impaired sensation, Decreased strength, Decreased mobility  Visit Diagnosis: Muscle weakness (generalized)  Other abnormalities of gait and mobility  Hemiplegia and hemiparesis following cerebral infarction affecting left dominant side (HCC)  Other lack of coordination  Unsteadiness on feet     Problem List Patient Active Problem List   Diagnosis Date Noted  . AKI (acute kidney injury) ([place])   . Fever   . Sepsis without acute organ dysfunction ([place])   . Elevated BUN   .  Blood pressure increase diastolic   . Labile blood pressure   . Leukopenia   . Benign essential HTN   . Hypoalbuminemia   . Cerebrovascular accident (CVA) of right  basal ganglia ([place]) 07/07/2019  . Acute ischemic stroke ([place])   . Dyslipidemia   . Dysphagia, post-stroke   . Polycythemia   . Stroke ([place]) 06/30/2019  . Prediabetes 12/23/2016  . Elevated LFTs 12/23/2016  . Pure hypercholesterolemia 12/21/2016  . Alcoholism ([place]) 12/21/2016  . Eczema 09/18/2013  . Essential hypertension 09/18/2013  . Gastroesophageal reflux disease without esophagitis 09/18/2013  . Tobacco abuse 09/18/2013    Jones Bales, PT, DPT 09/01/2019, 2:16 PM  [facility] [address] [place], [name], [address] Phone: [phone]   Fax:  [phone]  Name: Russell Russell MRN: [id number] Date of Birth: 03/29/1950

## 2019-09-03 ENCOUNTER — Encounter: Payer: BLUE CROSS/BLUE SHIELD | Admitting: Occupational Therapy

## 2019-09-03 ENCOUNTER — Ambulatory Visit: Payer: BC Managed Care – PPO

## 2019-09-03 ENCOUNTER — Other Ambulatory Visit: Payer: Self-pay

## 2019-09-03 DIAGNOSIS — R2689 Other abnormalities of gait and mobility: Secondary | ICD-10-CM

## 2019-09-03 DIAGNOSIS — I69352 Hemiplegia and hemiparesis following cerebral infarction affecting left dominant side: Secondary | ICD-10-CM

## 2019-09-03 DIAGNOSIS — M6281 Muscle weakness (generalized): Secondary | ICD-10-CM

## 2019-09-03 DIAGNOSIS — R2681 Unsteadiness on feet: Secondary | ICD-10-CM | POA: Diagnosis not present

## 2019-09-03 NOTE — Therapy (Signed)
[facility] [address] [place], [name], [address] Phone: [phone]   Fax:  [phone]  Physical Therapy Treatment  Patient Details  Name: Russell Russell MRN: [id number] Date of Birth: May 05, 1950 Referring Provider (PT): Lauraine Rinne, PA-C   Encounter Date: 09/03/2019   PT End of Session - 09/03/19 1151    Visit Number 7    Number of Visits 17    Date for PT Re-Evaluation 11/04/19    Authorization Type BCBS (follow medicare guidelines, 10th visit PN)    Progress Note Due on Visit 10    PT Start Time 1146    PT Stop Time 1230    PT Time Calculation (min) 44 min    Equipment Utilized During Treatment Gait belt    Activity Tolerance Patient tolerated treatment well    Behavior During Therapy [facility] for tasks assessed/performed           Past Medical History:  Diagnosis Date  . Alcohol abuse    drinks 1 gallon of Gin every weekend, nothing during the week x >15 yrs  . Elevated transaminase level    +elev bili: abd u/s 06/2014 showed stable small hepatic hemangiomas, o/w normal.  . Essential hypertension   . GERD (gastroesophageal reflux disease)   . Hyperlipidemia 03/2014   Atorv started-chol improved  . Impaired fasting glucose 03/2014  . Nephrolithiasis   . PUD (peptic ulcer disease)   . Stroke ([place])   . Tobacco dependence    chantix: "psych effects"    Past Surgical History:  Procedure Laterality Date  . COLONOSCOPY  2005   Recall 10 yrs ([place])  . LOOP RECORDER INSERTION N/A 07/03/2019   Procedure: LOOP RECORDER INSERTION;  Surgeon: Thompson Grayer, MD;  Location: [facility];  Service: Cardiovascular;  Laterality: N/A;  . removal of kidney stones  2006   cystoscopic--removed from ureter.  No prob since.    There were no vitals filed for this visit.   Subjective Assessment - 09/03/19 1149    Subjective Patient reports no new changes. No falls. Reports was able to stand up at the sink  and reach for sticky notes/paper.    Pertinent History PMH: HTN, alcohol abuse (ongoing use), HLD, GERD, PUD, tobacco use    Limitations Standing;Walking;House hold activities    Patient Stated Goals return to prior level of function, walk, "wheelchair is not apart of me" (wants to get out of the w/c)    Currently in Pain? No/denies                             [facility] Adult PT Treatment/Exercise - 09/03/19 0001      Transfers   Transfers Sit to Stand;Stand to Sit;Stand Pivot Transfers    Sit to Stand 4: Min guard    Sit to Stand Details Tactile cues for placement;Verbal cues for technique;Verbal cues for sequencing;Manual facilitation for placement;Manual facilitation for weight bearing    Sit to Stand Details (indicate cue type and reason) Patient demo improved carryover from last session in regards to hand placement on RW prior to standing.     Stand to Sit 4: Min guard    Stand to Sit Details (indicate cue type and reason) Tactile cues for sequencing;Tactile cues for weight shifting;Verbal cues for technique    Stand Pivot Transfers 4: Min guard    Stand Pivot Transfer Details (indicate cue type and reason) CGA throughout transfer,  PT only providing verbal/tactile cues for lateral weight shifting and stepping for improved transfer.       Ambulation/Gait   Ambulation/Gait Yes    Ambulation/Gait Assistance 4: Min assist    Ambulation/Gait Assistance Details Pt ambulating with RW with PT providing faciliation through pelvis. Completed 180 deg turns at end of gait trianing to focus on improved transfer training to various surfaces with RW and working on improved lateral stepping/weight shift.     Ambulation Distance (Feet) 70 Feet   35 x 2   Assistive device Rolling walker    Gait Pattern Step-through pattern;Step-to pattern;Decreased stride length;Decreased stance time - right;Decreased step length - left    Ambulation Surface Level;Indoor      Neuro Re-ed    Neuro Re-ed  Details  Completed standing tolerance, included standing in front of mirror for visual feedback x 3 min 22 secs with RW placed in front. Patient able use no UE support from RW for 30-45 secs and maintian balance. After 3 minutes patient did require rest break. Followed with standing reaching task for clothes pins, up and to far L to promote weight shifting and functional use of LUE. Overall patient able to maintain balance and stand w/ supervision and RW for 3 minutes and 53 seconds. Patient also able to maintain proper upright alignment throughout standing activties.                   PT Education - 09/03/19 1230    Education Details Educated on progress toward [place]) Educated Patient;Spouse    Methods Explanation    Comprehension Verbalized understanding            PT Short Term Goals - 09/03/19 1151      PT SHORT TERM GOAL #1   Title Patient will be independent with Initial HEP, with wifes assistance    Baseline Patient reports independence with HEP with wifes assistance at this time, reports completing exercises everyday.    Time 4    Period Weeks    Status Achieved    Target Date 09/03/19      PT SHORT TERM GOAL #2   Title Patient will demonstrate ability to complete stand static w/ LRAD and midline alignment x 4 minutes w/o LOB to demonstrate improved tolerance for ADL tasks    Baseline able to stand for 3 minutes 53 secs with RW and proper alignment w/o LOB    Time 4    Period Weeks    Status On-going    Target Date 09/03/19      PT SHORT TERM GOAL #3   Title Patient will demo ability to complete stand pivot transfer w/ LRAD and Min A for improved functional mobility    Baseline completed with RW and min A    Time 4    Period Weeks    Status Achieved    Target Date 09/03/19      PT SHORT TERM GOAL #4   Title Patient will demo ability to ambulate 25 feet w/ LRAD and Min A for improved mobility    Baseline ambulate 35 ft w/ RW and Min A    Time 4      Period Weeks    Status Achieved    Target Date 09/03/19             PT Long Term Goals - 08/06/19 2057      PT LONG TERM GOAL #1   Title Patient will be  independent with Final HEP, with wifes assistance    Time 8    Period Weeks    Status New    Target Date 10/01/19      PT LONG TERM GOAL #2   Title Patient will demonstrate ability to complete transfer from w/c <> mat w/ CGA to demo improved independence with transfers.    Baseline Mod A    Time 8    Period Weeks    Status New    Target Date 10/01/19      PT LONG TERM GOAL #3   Title Patient will demonstrate ability to ambulate 50 ft w/ LRAD and min A to demonstrate improved household mobility    Time 8    Period Weeks    Status New    Target Date 10/01/19      PT LONG TERM GOAL #4   Title Patient will demonstrate ability to complete sit <> stand Mod I to demonstrate improved functional mobility    Time 8    Period Weeks    Status New    Target Date 10/01/19                 Plan - 09/03/19 1442    Clinical Impression Statement Today's skilled PT session focused on patient progress toward all STG's. Patient able to meet 3 out of 4 STG's. Patient is demo improved gait with less physical assistance, improved functional mobility with transfers, and overall improved standing tolerance. Patient is demonstrating good progress with therapy services, and will continue to benefit from PT services to maximize funcitonal mobility and return to PLOF.    Personal Factors and Comorbidities Comorbidity 3+    Comorbidities HTN, alcohol abuse (ongoing use), HLD, GERD, PUD, tobacco use    Examination-Activity Limitations Bed Mobility;Dressing;Locomotion Level;Stairs;Stand;Toileting;Transfers;Bathing    Examination-Participation Restrictions Driving;Interpersonal Relationship;Yard Work    [employer]chant [employer] officer Evolving/Moderate complexity    Rehab Potential Good    PT Frequency 2x / week    PT Duration 8 weeks     PT Treatment/Interventions ADLs/Self Care Home Management;Electrical Stimulation;DME Instruction;Moist Heat;Cryotherapy;Gait training;Stair training;Functional mobility training;Therapeutic activities;Therapeutic exercise;Balance training;Neuromuscular re-education;Patient/family education;Orthotic Fit/Training;Wheelchair mobility training;Passive range of motion    PT Next Visit Plan Gait with RW, NMR for improved R lateral weight shift, mid range activities, transfers, weight shifting, tall kneeling.           Patient will benefit from skilled therapeutic intervention in order to improve the following deficits and impairments:  Abnormal gait, Decreased coordination, Difficulty walking, Impaired tone, Decreased safety awareness, Decreased endurance, Decreased activity tolerance, Pain, Decreased balance, Decreased knowledge of use of DME, Postural dysfunction, Impaired sensation, Decreased strength, Decreased mobility  Visit Diagnosis: Muscle weakness (generalized)  Other abnormalities of gait and mobility  Hemiplegia and hemiparesis following cerebral infarction affecting left dominant side (HCC)  Unsteadiness on feet     Problem List Patient Active Problem List   Diagnosis Date Noted  . AKI (acute kidney injury) ([place])   . Fever   . Sepsis without acute organ dysfunction ([place])   . Elevated BUN   . Blood pressure increase diastolic   . Labile blood pressure   . Leukopenia   . Benign essential HTN   . Hypoalbuminemia   . Cerebrovascular accident (CVA) of right basal ganglia ([place]) 07/07/2019  . Acute ischemic stroke ([place])   . Dyslipidemia   . Dysphagia, post-stroke   . Polycythemia   . Stroke ([place]) 06/30/2019  . Prediabetes 12/23/2016  . Elevated LFTs  12/23/2016  . Pure hypercholesterolemia 12/21/2016  . Alcoholism ([place]) 12/21/2016  . Eczema 09/18/2013  . Essential hypertension 09/18/2013  . Gastroesophageal reflux disease without esophagitis 09/18/2013  . Tobacco abuse  09/18/2013    Jones Bales, PT, DPT 09/03/2019, 2:45 PM  [facility] [address] [place], [name], [address] Phone: [phone]   Fax:  [phone]  Name: Elan Brainerd MRN: [id number] Date of Birth: June 08, 1950

## 2019-09-07 ENCOUNTER — Ambulatory Visit (INDEPENDENT_AMBULATORY_CARE_PROVIDER_SITE_OTHER): Payer: BC Managed Care – PPO | Admitting: *Deleted

## 2019-09-07 DIAGNOSIS — I639 Cerebral infarction, unspecified: Secondary | ICD-10-CM | POA: Diagnosis not present

## 2019-09-07 LAB — CUP PACEART REMOTE DEVICE CHECK
Date Time Interrogation Session: 20210627230456
Implantable Pulse Generator Implant Date: 20210423

## 2019-09-08 ENCOUNTER — Ambulatory Visit: Payer: BC Managed Care – PPO

## 2019-09-08 ENCOUNTER — Encounter: Payer: BLUE CROSS/BLUE SHIELD | Admitting: Speech Pathology

## 2019-09-08 ENCOUNTER — Other Ambulatory Visit: Payer: Self-pay

## 2019-09-08 DIAGNOSIS — R41841 Cognitive communication deficit: Secondary | ICD-10-CM

## 2019-09-08 DIAGNOSIS — I69352 Hemiplegia and hemiparesis following cerebral infarction affecting left dominant side: Secondary | ICD-10-CM

## 2019-09-08 DIAGNOSIS — M6281 Muscle weakness (generalized): Secondary | ICD-10-CM

## 2019-09-08 DIAGNOSIS — R471 Dysarthria and anarthria: Secondary | ICD-10-CM

## 2019-09-08 DIAGNOSIS — R2689 Other abnormalities of gait and mobility: Secondary | ICD-10-CM

## 2019-09-08 DIAGNOSIS — R2681 Unsteadiness on feet: Secondary | ICD-10-CM

## 2019-09-08 DIAGNOSIS — R278 Other lack of coordination: Secondary | ICD-10-CM

## 2019-09-08 NOTE — Therapy (Signed)
[facility] [address] [place], [name], [address] Phone: [phone]   Fax:  [phone]  Speech Language Pathology Treatment  Patient Details  Name: Russell Russell MRN: [id number] Date of Birth: [birth year] Referring Provider (SLP): Stann Mainland, [place]   Encounter Date: 09/08/2019   End of Session - 09/08/19 2336    Visit Number 5    Number of Visits 17    Date for SLP Re-Evaluation 11/05/19    SLP Start Time 57    SLP Stop Time  1400    SLP Time Calculation (min) 42 min    Activity Tolerance Patient tolerated treatment well           Past Medical History:  Diagnosis Date  . Alcohol abuse    drinks 1 gallon of Gin every weekend, nothing during the week x >15 yrs  . Elevated transaminase level    +elev bili: abd u/s 06/2014 showed stable small hepatic hemangiomas, o/w normal.  . Essential hypertension   . GERD (gastroesophageal reflux disease)   . Hyperlipidemia 03/2014   Atorv started-chol improved  . Impaired fasting glucose 03/2014  . Nephrolithiasis   . PUD (peptic ulcer disease)   . Stroke ([place])   . Tobacco dependence    chantix: "psych effects"    Past Surgical History:  Procedure Laterality Date  . COLONOSCOPY  2005   Recall 10 yrs ([place])  . LOOP RECORDER INSERTION N/A 07/03/2019   Procedure: LOOP RECORDER INSERTION;  Surgeon: Thompson Grayer, MD;  Location: [facility];  Service: Cardiovascular;  Laterality: N/A;  . removal of kidney stones  2006   cystoscopic--removed from ureter.  No prob since.    There were no vitals filed for this visit.          ADULT SLP TREATMENT - 09/08/19 1323      General Information   Behavior/Cognition Alert;Cooperative;Pleasant mood      Treatment Provided   Treatment provided Cognitive-Linquistic      Cognitive-Linquistic Treatment   Treatment focused on Cognition;Dysarthria    Skilled Treatment (Speech tx, ind) Pt had adequate  sound with EMST 40-50% of the time, incr'd to 75% with usual cues for effort. Pt was more consistent today with full breath for each rep of EMST; req'd min-mod cues rarely. Pt stated he had performed 2 sets of 25 reps instead of one set he said he completed last session.  Pt endorses he feels voice is getting stronger; wife states she asks pt less frequenty to repeat himself. Pt completed sentences with 80% intelligibility on first attempt. (cognition 25 minutes) Simple verbal reasoning - pt req'd mod-max A occasionally (divergent naming with like beginning letter, or ending letters).       Assessment / Recommendations / Plan   Plan Continue with current plan of care      Progression Toward Goals   Progression toward goals Progressing toward goals            SLP Education - 09/08/19 2333    Education Details better with breath for each rep but need a strong blow each rep    Person(s) Educated Patient;Spouse    Methods Explanation;Demonstration;Verbal cues    Comprehension Verbalized understanding;Returned demonstration;Verbal cues required;Need further instruction            SLP Short Term Goals - 09/08/19 2337      SLP SHORT TERM GOAL #1   Title pt will demo the knowledge that he can use  a memory system for daily events, ID'ing prospective and past events, medication management, and appoitmetn tracking, etc x 3 sessions    Time 1    Period Weeks   or 9 total visits, for all STGs   Status On-going      SLP SHORT TERM GOAL #2   Title pt will demo selective atteniton for 5 mintues to perform simple cognitive lingiustic tasks in a min-mod noisy environment x2 sessions    Time 1    Period Weeks    Status On-going      SLP SHORT TERM GOAL #3   Title pt will tell SLP 2 congitive deficits affecting his life at home post-CVA in two sessions    Time 1    Period Weeks    Status On-going      SLP SHORT TERM GOAL #4   Title pt will incr expiratory muscle strength taken in first 2 ST  sessions via spirometer    Baseline MEP 45 on 08/13/19    Time 1    Period Weeks    Status On-going            SLP Long Term Goals - 09/08/19 2338      SLP LONG TERM GOAL #1   Title pt will demo simple conversation of 5 mintues with average mid 60s dB x 3 sessions    Time 5    Period Weeks   or 17 sessions   Status On-going      SLP LONG TERM GOAL #2   Title pt will demo selective atteniton for 10 mintues to perform simple cognitive lingiustic tasks in a min-mod noisy environment x3 sessions    Time 5    Period Weeks    Status On-going      SLP LONG TERM GOAL #3   Title pt will demo WFL alternating attention in simple cognitve linguistic tasks functional for pt in 3 sessions    Time 5    Period Weeks    Status On-going      SLP LONG TERM GOAL #4   Title pt will independnently access memory notebook/planner for appoitnment management, to-do-lists, recall of prospective and past events, etc in 3 sessions    Time 5    Period Weeks    Status On-going            Plan - 09/08/19 2337    Clinical Impression Statement Pt presents today with  cognitive communication deficits which contiue to affect his ability to be independent and at their current level will require consistent assistance from wife/family for most needs. Pt also presents with reduced voice volume (low 60s dB most of the time), affecting intelligibility with wife at home. SLP recommends skilled ST to address cognitive linguistics and dysarthria c/b reduced voice volume.    Speech Therapy Frequency 2x / week    Duration --   8 weeks or 17 total visits   Treatment/Interventions Environmental controls;Other (comment);Compensatory techniques;Functional tasks;SLP instruction and feedback;Cueing hierarchy;Cognitive reorganization;Patient/family education;Internal/external aids   EMST   Potential to Achieve Goals Fair    Potential Considerations Severity of impairments;Ability to learn/carryover information    Consulted  and Agree with Plan of Care Patient;Family member/caregiver    Family Member Consulted wife           Patient will benefit from skilled therapeutic intervention in order to improve the following deficits and impairments:   Dysarthria and anarthria  Cognitive communication deficit    Problem List Patient  Active Problem List   Diagnosis Date Noted  . AKI (acute kidney injury) ([place])   . Fever   . Sepsis without acute organ dysfunction ([place])   . Elevated BUN   . Blood pressure increase diastolic   . Labile blood pressure   . Leukopenia   . Benign essential HTN   . Hypoalbuminemia   . Cerebrovascular accident (CVA) of right basal ganglia ([place]) 07/07/2019  . Acute ischemic stroke ([place])   . Dyslipidemia   . Dysphagia, post-stroke   . Polycythemia   . Stroke ([place]) 06/30/2019  . Prediabetes 12/23/2016  . Elevated LFTs 12/23/2016  . Pure hypercholesterolemia 12/21/2016  . Alcoholism ([place]) 12/21/2016  . Eczema 09/18/2013  . Essential hypertension 09/18/2013  . Gastroesophageal reflux disease without esophagitis 09/18/2013  . Tobacco abuse 09/18/2013    [facility] ,[place], [place]  09/08/2019, 11:39 PM  [facility] [address] [place], [name], [address] Phone: [phone]   Fax:  [phone]   Name: Russell Russell MRN: [id number] Date of Birth: Feb 05, 1951

## 2019-09-08 NOTE — Therapy (Signed)
[facility] [address] [place], [name], [address] Phone: [phone]   Fax:  [phone]  Physical Therapy Treatment  Patient Details  Name: Russell Russell MRN: [id number] Date of Birth: 06-Jul-1950 Referring Provider (PT): Lauraine Rinne, PA-C   Encounter Date: 09/08/2019   PT End of Session - 09/08/19 1235    Visit Number 8    Number of Visits 17    Date for PT Re-Evaluation 11/04/19    Authorization Type BCBS (follow medicare guidelines, 10th visit PN)    Progress Note Due on Visit 10    PT Start Time 1231    PT Stop Time 1314    PT Time Calculation (min) 43 min    Equipment Utilized During Treatment Gait belt    Activity Tolerance Patient tolerated treatment well    Behavior During Therapy WFL for tasks assessed/performed           Past Medical History:  Diagnosis Date  . Alcohol abuse    drinks 1 gallon of Gin every weekend, nothing during the week x >15 yrs  . Elevated transaminase level    +elev bili: abd u/s 06/2014 showed stable small hepatic hemangiomas, o/w normal.  . Essential hypertension   . GERD (gastroesophageal reflux disease)   . Hyperlipidemia 03/2014   Atorv started-chol improved  . Impaired fasting glucose 03/2014  . Nephrolithiasis   . PUD (peptic ulcer disease)   . Stroke ([place])   . Tobacco dependence    chantix: "psych effects"    Past Surgical History:  Procedure Laterality Date  . COLONOSCOPY  2005   Recall 10 yrs ([place])  . LOOP RECORDER INSERTION N/A 07/03/2019   Procedure: LOOP RECORDER INSERTION;  Surgeon: Thompson Grayer, MD;  Location: [facility];  Service: Cardiovascular;  Laterality: N/A;  . removal of kidney stones  2006   cystoscopic--removed from ureter.  No prob since.    There were no vitals filed for this visit.   Subjective Assessment - 09/08/19 1234    Subjective Patient reports that has been standing at sink, approx 3 times each day. No falls.     Pertinent History PMH: HTN, alcohol abuse (ongoing use), HLD, GERD, PUD, tobacco use    Limitations Standing;Walking;House hold activities    Patient Stated Goals return to prior level of function, walk, "wheelchair is not apart of me" (wants to get out of the w/c)    Currently in Pain? No/denies                             [facility] Adult PT Treatment/Exercise - 09/08/19 0001      Transfers   Transfers Sit to Stand;Stand to Sit;Stand Pivot Transfers    Sit to Stand 4: Min guard    Sit to Stand Details Tactile cues for placement;Verbal cues for technique;Verbal cues for sequencing;Manual facilitation for placement;Manual facilitation for weight bearing    Sit to Stand Details (indicate cue type and reason) PT providing verbal cues for improved forward lean and hand placement with sit <> stand.     Stand to Sit 4: Min guard    Stand to Sit Details (indicate cue type and reason) Tactile cues for sequencing;Tactile cues for weight shifting;Verbal cues for technique    Stand Pivot Transfers 4: Min guard    Stand Pivot Transfer Details (indicate cue type and reason) PT providing verbal cues for left side stepping and maintaining inside  AD for proper completion of transfer.     Comments Completed sit <> stand training from mat with 2" block placed under RLE to promote L weight shift and LLE strengthening. Compelted 2 x 10 reps with rest breaks between set. Verbal cues for upright posture.       Ambulation/Gait   Ambulation/Gait Yes    Ambulation/Gait Assistance 4: Min assist    Ambulation/Gait Assistance Details Completed gait training with RW, patient able to ambulate 40 ft, 20 ft with Min A throughout. PT providing verbal cues for improved step length, and proper completion of turns with gait training. PT providing manual faciliation at pelvis to promote improved step length and weight shift. Pt demo increased difficulty with maintaining alignment in RW with slight turns.     Ambulation Distance (Feet) 40 Feet   20   Assistive device Rolling walker    Gait Pattern Step-through pattern;Step-to pattern;Decreased stride length;Decreased stance time - right;Decreased step length - left    Ambulation Surface Level;Indoor      Therapeutic Activites    Therapeutic Activities Other Therapeutic Activities    Other Therapeutic Activities Completed static standing at RW without support. Focus on maintaining balance and holding steady, patient able to hold 25-30 secs prior to require UE support, completed x 4 reps. Standing at walker completed alternating reaching with BUE to targets in various directions w/ single UE support, 2 x 3 minutes.                    PT Short Term Goals - 09/03/19 1151      PT SHORT TERM GOAL #1   Title Patient will be independent with Initial HEP, with wifes assistance    Baseline Patient reports independence with HEP with wifes assistance at this time, reports completing exercises everyday.    Time 4    Period Weeks    Status Achieved    Target Date 09/03/19      PT SHORT TERM GOAL #2   Title Patient will demonstrate ability to complete stand static w/ LRAD and midline alignment x 4 minutes w/o LOB to demonstrate improved tolerance for ADL tasks    Baseline able to stand for 3 minutes 53 secs with RW and proper alignment w/o LOB    Time 4    Period Weeks    Status On-going    Target Date 09/03/19      PT SHORT TERM GOAL #3   Title Patient will demo ability to complete stand pivot transfer w/ LRAD and Min A for improved functional mobility    Baseline completed with RW and min A    Time 4    Period Weeks    Status Achieved    Target Date 09/03/19      PT SHORT TERM GOAL #4   Title Patient will demo ability to ambulate 25 feet w/ LRAD and Min A for improved mobility    Baseline ambulate 35 ft w/ RW and Min A    Time 4    Period Weeks    Status Achieved    Target Date 09/03/19             PT Long Term Goals -  08/06/19 2057      PT LONG TERM GOAL #1   Title Patient will be independent with Final HEP, with wifes assistance    Time 8    Period Weeks    Status New    Target Date 10/01/19  PT LONG TERM GOAL #2   Title Patient will demonstrate ability to complete transfer from w/c <> mat w/ CGA to demo improved independence with transfers.    Baseline Mod A    Time 8    Period Weeks    Status New    Target Date 10/01/19      PT LONG TERM GOAL #3   Title Patient will demonstrate ability to ambulate 50 ft w/ LRAD and min A to demonstrate improved household mobility    Time 8    Period Weeks    Status New    Target Date 10/01/19      PT LONG TERM GOAL #4   Title Patient will demonstrate ability to complete sit <> stand Mod I to demonstrate improved functional mobility    Time 8    Period Weeks    Status New    Target Date 10/01/19                 Plan - 09/08/19 1355    Clinical Impression Statement Today's skilled PT session focused on therapeutic activties working on promoting improved static standing with minimal UE support. Progressed to dyanmic standing tasks with reaching actvitity. All activites and gait training, PT providing manual faciliation for improved weight shift to L. Patient require intermittent rest breaks with activities due to fatigue. Patient will continue to benefit from skilled PT services to progress toward all goals.    Personal Factors and Comorbidities Comorbidity 3+    Comorbidities HTN, alcohol abuse (ongoing use), HLD, GERD, PUD, tobacco use    Examination-Activity Limitations Bed Mobility;Dressing;Locomotion Level;Stairs;Stand;Toileting;Transfers;Bathing    Examination-Participation Restrictions Driving;Interpersonal Relationship;Yard Work    [employer] Evolving/Moderate complexity    Rehab Potential Good    PT Frequency 2x / week    PT Duration 8 weeks    PT Treatment/Interventions ADLs/Self Care Home  Management;Electrical Stimulation;DME Instruction;Moist Heat;Cryotherapy;Gait training;Stair training;Functional mobility training;Therapeutic activities;Therapeutic exercise;Balance training;Neuromuscular re-education;Patient/family education;Orthotic Fit/Training;Wheelchair mobility training;Passive range of motion    PT Next Visit Plan continue gait with RW, NMR for improved R lateral weight shift, mid range activities, transfers, weight shifting, tall kneeling.           Patient will benefit from skilled therapeutic intervention in order to improve the following deficits and impairments:  Abnormal gait, Decreased coordination, Difficulty walking, Impaired tone, Decreased safety awareness, Decreased endurance, Decreased activity tolerance, Pain, Decreased balance, Decreased knowledge of use of DME, Postural dysfunction, Impaired sensation, Decreased strength, Decreased mobility  Visit Diagnosis: Muscle weakness (generalized)  Other abnormalities of gait and mobility  Unsteadiness on feet  Other lack of coordination  Hemiplegia and hemiparesis following cerebral infarction affecting left dominant side [facility])     Problem List Patient Active Problem List   Diagnosis Date Noted  . AKI (acute kidney injury) ([place])   . Fever   . Sepsis without acute organ dysfunction ([place])   . Elevated BUN   . Blood pressure increase diastolic   . Labile blood pressure   . Leukopenia   . Benign essential HTN   . Hypoalbuminemia   . Cerebrovascular accident (CVA) of right basal ganglia ([place]) 07/07/2019  . Acute ischemic stroke ([place])   . Dyslipidemia   . Dysphagia, post-stroke   . Polycythemia   . Stroke ([place]) 06/30/2019  . Prediabetes 12/23/2016  . Elevated LFTs 12/23/2016  . Pure hypercholesterolemia 12/21/2016  . Alcoholism ([place]) 12/21/2016  . Eczema 09/18/2013  . Essential hypertension 09/18/2013  . Gastroesophageal reflux disease  without esophagitis 09/18/2013  . Tobacco abuse 09/18/2013     Jones Bales, PT, DPT 09/08/2019, 1:58 PM  [facility] [address] [place], [name], [address] Phone: [phone]   Fax:  [phone]  Name: Russell Russell MRN: [id number] Date of Birth: 04-14-1950

## 2019-09-08 NOTE — Progress Notes (Signed)
[facility]link Summary Report / Loop Recorder 

## 2019-09-11 ENCOUNTER — Encounter: Payer: Self-pay | Admitting: Occupational Therapy

## 2019-09-11 ENCOUNTER — Other Ambulatory Visit: Payer: Self-pay

## 2019-09-11 ENCOUNTER — Ambulatory Visit: Payer: BC Managed Care – PPO | Attending: Physician Assistant

## 2019-09-11 ENCOUNTER — Ambulatory Visit: Payer: BC Managed Care – PPO

## 2019-09-11 ENCOUNTER — Ambulatory Visit: Payer: BC Managed Care – PPO | Admitting: Occupational Therapy

## 2019-09-11 DIAGNOSIS — R482 Apraxia: Secondary | ICD-10-CM | POA: Diagnosis present

## 2019-09-11 DIAGNOSIS — R41840 Attention and concentration deficit: Secondary | ICD-10-CM | POA: Diagnosis present

## 2019-09-11 DIAGNOSIS — I639 Cerebral infarction, unspecified: Secondary | ICD-10-CM | POA: Diagnosis present

## 2019-09-11 DIAGNOSIS — R41841 Cognitive communication deficit: Secondary | ICD-10-CM

## 2019-09-11 DIAGNOSIS — R209 Unspecified disturbances of skin sensation: Secondary | ICD-10-CM | POA: Insufficient documentation

## 2019-09-11 DIAGNOSIS — M6281 Muscle weakness (generalized): Secondary | ICD-10-CM

## 2019-09-11 DIAGNOSIS — R278 Other lack of coordination: Secondary | ICD-10-CM | POA: Insufficient documentation

## 2019-09-11 DIAGNOSIS — R471 Dysarthria and anarthria: Secondary | ICD-10-CM | POA: Insufficient documentation

## 2019-09-11 DIAGNOSIS — R208 Other disturbances of skin sensation: Secondary | ICD-10-CM

## 2019-09-11 DIAGNOSIS — I69352 Hemiplegia and hemiparesis following cerebral infarction affecting left dominant side: Secondary | ICD-10-CM

## 2019-09-11 DIAGNOSIS — R2681 Unsteadiness on feet: Secondary | ICD-10-CM

## 2019-09-11 DIAGNOSIS — R2689 Other abnormalities of gait and mobility: Secondary | ICD-10-CM | POA: Diagnosis present

## 2019-09-11 NOTE — Therapy (Signed)
[facility] [address] [place], [name], [address] Phone: [phone]   Fax:  [phone]  Occupational Therapy Treatment  Patient Details  Name: Russell Russell MRN: [id number] Date of Birth: Apr 23, 1950 Referring Provider (OT): [name]   Encounter Date: 09/11/2019   OT End of Session - 09/11/19 1322    Visit Number 2    Number of Visits 17    Authorization Type BCBS? "Follow Medicre Guidelines"    Progress Note Due on Visit 10    OT Start Time 1230    OT Stop Time 1315    OT Time Calculation (min) 45 min    Activity Tolerance Patient tolerated treatment well    Behavior During Therapy [facility] for tasks assessed/performed           Past Medical History:  Diagnosis Date  . Alcohol abuse    drinks 1 gallon of Gin every weekend, nothing during the week x >15 yrs  . Elevated transaminase level    +elev bili: abd u/s 06/2014 showed stable small hepatic hemangiomas, o/w normal.  . Essential hypertension   . GERD (gastroesophageal reflux disease)   . Hyperlipidemia 03/2014   Atorv started-chol improved  . Impaired fasting glucose 03/2014  . Nephrolithiasis   . PUD (peptic ulcer disease)   . Stroke ([place])   . Tobacco dependence    chantix: "psych effects"    Past Surgical History:  Procedure Laterality Date  . COLONOSCOPY  2005   Recall 10 yrs ([place])  . LOOP RECORDER INSERTION N/A 07/03/2019   Procedure: LOOP RECORDER INSERTION;  Surgeon: Thompson Grayer, MD;  Location: [facility];  Service: Cardiovascular;  Laterality: N/A;  . removal of kidney stones  2006   cystoscopic--removed from ureter.  No prob since.    There were no vitals filed for this visit.   Subjective Assessment - 09/11/19 1236    Subjective  They took hinm off of one of his bood pressure medicines    Currently in Pain? No/denies    Pain Score 0-No pain                        OT Treatments/Exercises (OP) -  09/11/19 1319      ADLs   ADL Comments Reviewed OT short and long term goals with patient and wife [name].  Both is agreement, and excited for more use of LUE and more independence with functional mobility and ADL      Neurological Re-education Exercises   Other Exercises 1 Initiated HEP for supine BUE shoulder dowel exercises.  Reviewed with patient and wife - adn oriented to [place] video process.  See patient instructions      Manual Therapy   Manual Therapy Joint mobilization    Joint Mobilization Carpal mobilization to allow increased range of motion and decrease wrist pain.  Issued prefab wrist support                  OT Education - 09/11/19 1321    Education Details OT Goals, and Initiated HEP - supine shoulder with dowel    Person(s) Educated Patient;Spouse    Methods Explanation;Demonstration;Tactile cues;Verbal cues;Handout    Comprehension Verbalized understanding;Returned demonstration;Need further instruction            OT Short Term Goals - 09/11/19 1237      OT SHORT TERM GOAL #1   Title Patient will compelte a home exercise program designed to  improve functional use of LUE with mod cueing and set up due 09/05/19    Status On-going      OT [place] #2   Title Patient will don a front opening shirt with fasteners (buttons/zipper) with mod assist    Status On-going      OT SHORT TERM GOAL #3   Title Patient will demonstrate ability to cut food on plate with min assist    Status On-going      OT SHORT TERM GOAL #4   Title Patient will demonstrate low reach, grasp, release of lightweight small object (less than 1 lb) with min assist             OT Long Term Goals - 09/11/19 1239      OT LONG TERM GOAL #1   Title Patient will complete an updated HEP to address LUE functioning due 10/05/19    Status On-going      OT LONG TERM GOAL #2   Title Patient will walk with assistive device into bathroom to use toilet with min assist    Status  On-going      OT LONG TERM GOAL #3   Title Patient will transfer onto shower seat with min assist    Status On-going      OT LONG TERM GOAL #4   Title Patient will obtain and release a 2-3 lb object at chest height shelf using LUE and min assist    Status On-going                 Plan - 09/11/19 1322    Clinical Impression Statement Patient and wife agreeable to OT goals and eager for greater independence with ADL and functional mobility    OT Frequency 2x / week    OT Duration 8 weeks    OT Treatment/Interventions Self-care/ADL training;Electrical Stimulation;Therapeutic exercise;Visual/perceptual remediation/compensation;Patient/family education;Splinting;Neuromuscular education;Moist Heat;Aquatic Therapy;Balance training;Therapeutic activities;Functional Mobility Training;Fluidtherapy;DME and/or AE instruction;Manual Therapy;Cognitive remediation/compensation    Plan Functional mobility sit to stand, stand balance, functional use of LUE    Consulted and Agree with Plan of Care Patient;Family member/caregiver    Family Member Consulted Wife, [name]           Patient will benefit from skilled therapeutic intervention in order to improve the following deficits and impairments:           Visit Diagnosis: Muscle weakness (generalized)  Unsteadiness on feet  Other lack of coordination  Hemiplegia and hemiparesis following cerebral infarction affecting left dominant side ([facility])  Apraxia  Other disturbances of skin sensation    Problem List Patient Active Problem List   Diagnosis Date Noted  . AKI (acute kidney injury) ([place])   . Fever   . Sepsis without acute organ dysfunction ([place])   . Elevated BUN   . Blood pressure increase diastolic   . Labile blood pressure   . Leukopenia   . Benign essential HTN   . Hypoalbuminemia   . Cerebrovascular accident (CVA) of right basal ganglia ([place]) 07/07/2019  . Acute ischemic stroke ([place])   . Dyslipidemia   . Dysphagia,  post-stroke   . Polycythemia   . Stroke ([place]) 06/30/2019  . Prediabetes 12/23/2016  . Elevated LFTs 12/23/2016  . Pure hypercholesterolemia 12/21/2016  . Alcoholism ([place]) 12/21/2016  . Eczema 09/18/2013  . Essential hypertension 09/18/2013  . Gastroesophageal reflux disease without esophagitis 09/18/2013  . Tobacco abuse 09/18/2013    Mariah Milling, OTR/L 09/11/2019, 1:24 PM  [facility] [address] [place], [name], [address] Phone: [phone]   Fax:  [phone]  Name: Edilberto Roosevelt MRN: [id number] Date of Birth: Apr 08, 1950

## 2019-09-11 NOTE — Therapy (Signed)
[facility] [address] [place], [name], [address] Phone: [phone]   Fax:  [phone]  Speech Language Pathology Treatment  Patient Details  Name: Russell Russell MRN: [id number] Date of Birth: 10-25-1950 Referring Provider (SLP): Stann Mainland, [place]   Encounter Date: 09/11/2019   End of Session - 09/11/19 2303    Visit Number 6    Number of Visits 17    Date for SLP Re-Evaluation 11/05/19    SLP Start Time 1150    SLP Stop Time  1230    SLP Time Calculation (min) 40 min    Activity Tolerance Patient tolerated treatment well           Past Medical History:  Diagnosis Date  . Alcohol abuse    drinks 1 gallon of Gin every weekend, nothing during the week x >15 yrs  . Elevated transaminase level    +elev bili: abd u/s 06/2014 showed stable small hepatic hemangiomas, o/w normal.  . Essential hypertension   . GERD (gastroesophageal reflux disease)   . Hyperlipidemia 03/2014   Atorv started-chol improved  . Impaired fasting glucose 03/2014  . Nephrolithiasis   . PUD (peptic ulcer disease)   . Stroke ([place])   . Tobacco dependence    chantix: "psych effects"    Past Surgical History:  Procedure Laterality Date  . COLONOSCOPY  2005   Recall 10 yrs ([place])  . LOOP RECORDER INSERTION N/A 07/03/2019   Procedure: LOOP RECORDER INSERTION;  Surgeon: Thompson Grayer, MD;  Location: [facility];  Service: Cardiovascular;  Laterality: N/A;  . removal of kidney stones  2006   cystoscopic--removed from ureter.  No prob since.    There were no vitals filed for this visit.   Subjective Assessment - 09/11/19 1152    Patient is accompained by: --   wanda   Currently in Pain? No/denies   pain in wrist not bothering him now                ADULT SLP TREATMENT - 09/11/19 1154      General Information   Behavior/Cognition Alert;Cooperative;Pleasant mood      Treatment Provided   Treatment provided  Cognitive-Linquistic      Cognitive-Linquistic Treatment   Treatment focused on Cognition;Dysarthria    Skilled Treatment Cognition(28 minutes) Pt now fililing his own med box withSBA from wife. Spoke with pt/wife about starting a Research officer, political party for pt - discussed each section for therapy and a calendar so that pt can track his own appointmetns. They are to bring in a 3-ring binder on Tuesday and SLP will assist pt in organizing this. (speech tx - ind) SLP had to give pt occasional cues for quick strong blow, and usual cues for full breath for pt's EMST. Appropriate sound heard 70% of the time with these cues.       Assessment / Recommendations / Plan   Plan Continue with current plan of care      Progression Toward Goals   Progression toward goals Progressing toward goals            SLP Education - 09/11/19 2302    Education Details memory planner/notebook, EMST procedure    Person(s) Educated Patient;Spouse    Methods Demonstration;Explanation;Verbal cues    Comprehension Verbalized understanding;Returned demonstration;Verbal cues required;Need further instruction            SLP Short Term Goals - 09/11/19 2305      SLP SHORT TERM  GOAL #1   Title pt will demo the knowledge that he can use a memory system for daily events, ID'ing prospective and past events, medication management, and appoitmetn tracking, etc x 3 sessions    Period --   or 9 total visits, for all STGs   Status Not Met      SLP SHORT TERM GOAL #2   Title pt will demo selective atteniton for 5 mintues to perform simple cognitive lingiustic tasks in a min-mod noisy environment x2 sessions    Status Partially Met      SLP SHORT TERM GOAL #3   Title pt will tell SLP 2 congitive deficits affecting his life at home post-CVA in two sessions    Status Deferred      SLP SHORT TERM GOAL #4   Title pt will incr expiratory muscle strength taken in first 2 ST sessions via spirometer    Baseline MEP 45 on 08/13/19     Time 1    Status Partially Met            SLP Long Term Goals - 09/11/19 2306      SLP LONG TERM GOAL #1   Title pt will demo simple conversation of 5 mintues with average mid 60s dB x 3 sessions    Time 5    Period Weeks   or 17 sessions   Status On-going      SLP LONG TERM GOAL #2   Title pt will demo selective atteniton for 7 mintues to perform simple cognitive lingiustic tasks in a min noisy environment x3 sessions    Time 5    Period Weeks    Status Revised      SLP LONG TERM GOAL #3   Title pt will demo WFL alternating attention in simple cognitve linguistic tasks with occasional min A in 3 sessions    Time 5    Period Weeks    Status On-going      SLP LONG TERM GOAL #4   Title pt will independnently access memory notebook/planner for appoitnment management, to-do-lists, recall of prospective and past events, etc in 3 sessions    Time 5    Period Weeks    Status On-going            Plan - 09/11/19 2304    Clinical Impression Statement Pt presents today with  cognitive communication deficits which contiue to affect his ability to be independent and at their current level will require consistent assistance from wife/family for most needs. SLP educated pt/wife on memory notebood and pt to bring in a 3-ring notebook on Tuesday so SLP can assist pt make this. Pt also presents with reduced voice volume (low 60s dB most of the time), affecting intelligibility with wife at home. SLP recommends skilled ST to address cognitive linguistics and dysarthria c/b reduced voice volume.    Speech Therapy Frequency 2x / week    Duration --   8 weeks or 17 total visits   Treatment/Interventions Environmental controls;Other (comment);Compensatory techniques;Functional tasks;SLP instruction and feedback;Cueing hierarchy;Cognitive reorganization;Patient/family education;Internal/external aids   EMST   Potential to Achieve Goals Fair    Potential Considerations Severity of  impairments;Ability to learn/carryover information    Consulted and Agree with Plan of Care Patient;Family member/caregiver    Family Member Consulted wife           Patient will benefit from skilled therapeutic intervention in order to improve the following deficits and impairments:   Cognitive communication  deficit  Dysarthria and anarthria    Problem List Patient Active Problem List   Diagnosis Date Noted  . AKI (acute kidney injury) ([place])   . Fever   . Sepsis without acute organ dysfunction ([place])   . Elevated BUN   . Blood pressure increase diastolic   . Labile blood pressure   . Leukopenia   . Benign essential HTN   . Hypoalbuminemia   . Cerebrovascular accident (CVA) of right basal ganglia ([place]) 07/07/2019  . Acute ischemic stroke ([place])   . Dyslipidemia   . Dysphagia, post-stroke   . Polycythemia   . Stroke ([place]) 06/30/2019  . Prediabetes 12/23/2016  . Elevated LFTs 12/23/2016  . Pure hypercholesterolemia 12/21/2016  . Alcoholism ([place]) 12/21/2016  . Eczema 09/18/2013  . Essential hypertension 09/18/2013  . Gastroesophageal reflux disease without esophagitis 09/18/2013  . Tobacco abuse 09/18/2013    [facility] ,[place], CCC-SLP  09/11/2019, 11:08 PM  [facility] [address] [place], [name], [address] Phone: [phone]   Fax:  [phone]   Name: Russell Russell MRN: [id number] Date of Birth: [birth year]

## 2019-09-11 NOTE — Patient Instructions (Signed)
Access Code: [email or web address] URL: [email or web address] Date: 09/11/2019 Prepared by: Marlowe Sax  Exercises Supine Shoulder Press with Dowel - 1 x daily - 7 x weekly - 3 sets - 10 reps Supine Shoulder Flexion Extension AAROM with Dowel - 1 x daily - 7 x weekly - 3 sets - 10 reps Supine Shoulder Protraction with Dowel - 1 x daily - 7 x weekly - 3 sets - 10 reps

## 2019-09-11 NOTE — Therapy (Signed)
[name], [name], [address] Phone: [phone]   Fax:  [phone]  Physical Therapy Treatment  Patient Details  Name: Russell Russell MRN: [id number] Date of Birth: [birth year] Referring Provider (PT): Lauraine Rinne, PA-C   Encounter Date: 09/11/2019   PT End of Session - 09/11/19 1107    Visit Number 9    Number of Visits 17    Date for PT Re-Evaluation 11/04/19    Authorization Type BCBS (follow medicare guidelines, 10th visit PN)    Progress Note Due on Visit 10    PT Start Time 1102    PT Stop Time 1145    PT Time Calculation (min) 43 min    Equipment Utilized During Treatment Gait belt    Activity Tolerance Patient tolerated treatment well    Behavior During Therapy WFL for tasks assessed/performed           Past Medical History:  Diagnosis Date  . Alcohol abuse    drinks 1 gallon of Gin every weekend, nothing during the week x >15 yrs  . Elevated transaminase level    +elev bili: abd u/s 06/2014 showed stable small hepatic hemangiomas, o/w normal.  . Essential hypertension   . GERD (gastroesophageal reflux disease)   . Hyperlipidemia 03/2014   Atorv started-chol improved  . Impaired fasting glucose 03/2014  . Nephrolithiasis   . PUD (peptic ulcer disease)   . Stroke ([place])   . Tobacco dependence    chantix: "psych effects"    Past Surgical History:  Procedure Laterality Date  . COLONOSCOPY  2005   Recall 10 yrs ([place])  . LOOP RECORDER INSERTION N/A 07/03/2019   Procedure: LOOP RECORDER INSERTION;  Surgeon: Thompson Grayer, MD;  Location: [facility];  Service: Cardiovascular;  Laterality: N/A;  . removal of kidney stones  2006   cystoscopic--removed from ureter.  No prob since.    There were no vitals filed for this visit.   Subjective Assessment - 09/11/19 1105    Subjective Patient reports no new issues/complaints. No falls. Patient reports some pain in  the L wrist.    Pertinent History PMH: HTN, alcohol abuse (ongoing use), HLD, GERD, PUD, tobacco use    Limitations Standing;Walking;House hold activities    Patient Stated Goals return to prior level of function, walk, "wheelchair is not apart of me" (wants to get out of the w/c)    Currently in Pain? Yes    Pain Score 6     Pain Location Wrist    Pain Orientation Left    Pain Descriptors / Indicators Sore;Aching    Aggravating Factors  moving the wrist                             [facility] Adult PT Treatment/Exercise - 09/11/19 0001      Transfers   Transfers Sit to Stand;Stand to Sit    Sit to Stand 4: Min guard    Sit to Stand Details Tactile cues for placement;Verbal cues for technique;Verbal cues for sequencing;Manual facilitation for placement;Manual facilitation for weight bearing    Sit to Stand Details (indicate cue type and reason) PT continue to provide verbal/tactile cues for improved weight shift.     Stand to Sit 4: Min guard    Stand to Sit Details (indicate cue type and reason) Tactile cues for sequencing;Tactile cues for weight shifting;Verbal cues for technique  Stand Pivot Transfers 4: Min guard    Stand Pivot Transfer Details (indicate cue type and reason) Patient still require verbal cues/tactile cues for improved lateral side stepping and management of RW to ensure proper alignment prior to descent.       Ambulation/Gait   Ambulation/Gait Yes    Ambulation/Gait Assistance 4: Min assist    Ambulation/Gait Assistance Details Completed gait training with RW. Compelted 84ft x 1, 16ft x 1, 40 ft x 1 with Min A. PT providing manual faciliation for improved weight shift to LLE to promote step length and swing.  Overall patient demonstrate improved swing with the LLE    Ambulation Distance (Feet) 45 Feet   76 x 1, 40 x 1   Assistive device Rolling walker    Gait Pattern Step-through pattern;Step-to pattern;Decreased stride length;Decreased stance time -  right;Decreased step length - left    Ambulation Surface Level;Indoor      Therapeutic Activites    Therapeutic Activities Other Therapeutic Activities    Other Therapeutic Activities Completed transfer training from mat <> w/c with 5-10 ft of gait between, to further work on improved turning and mangement of AD. Patient require increased verbal cues for proper management of AD, lateral stepping, and backwards stepping. PT educating on proper completion for improved safety with transfers.       Neuro Re-ed    Neuro Re-ed Details  Completed standing forward steps to 2" step with RLE to promote LLE weight shift and activation. PT providing manual faciliation to the L through pelvis. Completed 2 x 1 min each. Progressed to incorporating 3 second hold with step to promote further weight bearing.                     PT Short Term Goals - 09/03/19 1151      PT SHORT TERM GOAL #1   Title Patient will be independent with Initial HEP, with wifes assistance    Baseline Patient reports independence with HEP with wifes assistance at this time, reports completing exercises everyday.    Time 4    Period Weeks    Status Achieved    Target Date 09/03/19      PT SHORT TERM GOAL #2   Title Patient will demonstrate ability to complete stand static w/ LRAD and midline alignment x 4 minutes w/o LOB to demonstrate improved tolerance for ADL tasks    Baseline able to stand for 3 minutes 53 secs with RW and proper alignment w/o LOB    Time 4    Period Weeks    Status On-going    Target Date 09/03/19      PT SHORT TERM GOAL #3   Title Patient will demo ability to complete stand pivot transfer w/ LRAD and Min A for improved functional mobility    Baseline completed with RW and min A    Time 4    Period Weeks    Status Achieved    Target Date 09/03/19      PT SHORT TERM GOAL #4   Title Patient will demo ability to ambulate 25 feet w/ LRAD and Min A for improved mobility    Baseline ambulate 35  ft w/ RW and Min A    Time 4    Period Weeks    Status Achieved    Target Date 09/03/19             PT Long Term Goals - 08/06/19 2057  PT LONG TERM GOAL #1   Title Patient will be independent with Final HEP, with wifes assistance    Time 8    Period Weeks    Status New    Target Date 10/01/19      PT LONG TERM GOAL #2   Title Patient will demonstrate ability to complete transfer from w/c <> mat w/ CGA to demo improved independence with transfers.    Baseline Mod A    Time 8    Period Weeks    Status New    Target Date 10/01/19      PT LONG TERM GOAL #3   Title Patient will demonstrate ability to ambulate 50 ft w/ LRAD and min A to demonstrate improved household mobility    Time 8    Period Weeks    Status New    Target Date 10/01/19      PT LONG TERM GOAL #4   Title Patient will demonstrate ability to complete sit <> stand Mod I to demonstrate improved functional mobility    Time 8    Period Weeks    Status New    Target Date 10/01/19                 Plan - 09/11/19 1256    Clinical Impression Statement Continued extensive gait trianing in today's session with patient able to ambulate improved distances with RW at this time. PT still providing manual faciliation for improved weight shift, and step length. Continued transfer training to various surfaces, to work on improved AD management and lateral stepping with turns. Patient will continue to benefit form skilled PT services to maximize functional mobility.    Personal Factors and Comorbidities Comorbidity 3+    Comorbidities HTN, alcohol abuse (ongoing use), HLD, GERD, PUD, tobacco use    Examination-Activity Limitations Bed Mobility;Dressing;Locomotion Level;Stairs;Stand;Toileting;Transfers;Bathing    Examination-Participation Restrictions Driving;Interpersonal Relationship;Yard Work    [employer]chant [employer] officer Evolving/Moderate complexity    Rehab Potential Good    PT Frequency 2x / week      PT Duration 8 weeks    PT Treatment/Interventions ADLs/Self Care Home Management;Electrical Stimulation;DME Instruction;Moist Heat;Cryotherapy;Gait training;Stair training;Functional mobility training;Therapeutic activities;Therapeutic exercise;Balance training;Neuromuscular re-education;Patient/family education;Orthotic Fit/Training;Wheelchair mobility training;Passive range of motion    PT Next Visit Plan continue gait with RW, NMR for improved R lateral weight shift. transfers, working on AD management with turns, weight shifting activities, activities in tall kneeling.           Patient will benefit from skilled therapeutic intervention in order to improve the following deficits and impairments:  Abnormal gait, Decreased coordination, Difficulty walking, Impaired tone, Decreased safety awareness, Decreased endurance, Decreased activity tolerance, Pain, Decreased balance, Decreased knowledge of use of DME, Postural dysfunction, Impaired sensation, Decreased strength, Decreased mobility  Visit Diagnosis: Muscle weakness (generalized)  Other abnormalities of gait and mobility  Unsteadiness on feet  Other lack of coordination  Hemiplegia and hemiparesis following cerebral infarction affecting left dominant side [facility])     Problem List Patient Active Problem List   Diagnosis Date Noted  . AKI (acute kidney injury) ([place])   . Fever   . Sepsis without acute organ dysfunction ([place])   . Elevated BUN   . Blood pressure increase diastolic   . Labile blood pressure   . Leukopenia   . Benign essential HTN   . Hypoalbuminemia   . Cerebrovascular accident (CVA) of right basal ganglia ([place]) 07/07/2019  . Acute ischemic stroke ([place])   . Dyslipidemia   .  Dysphagia, post-stroke   . Polycythemia   . Stroke ([place]) 06/30/2019  . Prediabetes 12/23/2016  . Elevated LFTs 12/23/2016  . Pure hypercholesterolemia 12/21/2016  . Alcoholism ([place]) 12/21/2016  . Eczema 09/18/2013  . Essential  hypertension 09/18/2013  . Gastroesophageal reflux disease without esophagitis 09/18/2013  . Tobacco abuse 09/18/2013    Jones Bales, PT, DPT 09/11/2019, 1:00 PM  [facility] [address] [place], [name], [address] Phone: [phone]   Fax:  [phone]  Name: Russell Russell MRN: [id number] Date of Birth: 03/25/1950

## 2019-09-15 ENCOUNTER — Ambulatory Visit: Payer: BC Managed Care – PPO | Admitting: Speech Pathology

## 2019-09-15 ENCOUNTER — Other Ambulatory Visit: Payer: Self-pay

## 2019-09-15 ENCOUNTER — Ambulatory Visit: Payer: BC Managed Care – PPO

## 2019-09-15 ENCOUNTER — Encounter: Payer: Self-pay | Admitting: Occupational Therapy

## 2019-09-15 ENCOUNTER — Ambulatory Visit: Payer: BC Managed Care – PPO | Admitting: Occupational Therapy

## 2019-09-15 DIAGNOSIS — R2681 Unsteadiness on feet: Secondary | ICD-10-CM

## 2019-09-15 DIAGNOSIS — M6281 Muscle weakness (generalized): Secondary | ICD-10-CM

## 2019-09-15 DIAGNOSIS — R278 Other lack of coordination: Secondary | ICD-10-CM

## 2019-09-15 DIAGNOSIS — R208 Other disturbances of skin sensation: Secondary | ICD-10-CM

## 2019-09-15 DIAGNOSIS — R471 Dysarthria and anarthria: Secondary | ICD-10-CM

## 2019-09-15 DIAGNOSIS — R41840 Attention and concentration deficit: Secondary | ICD-10-CM

## 2019-09-15 DIAGNOSIS — R482 Apraxia: Secondary | ICD-10-CM

## 2019-09-15 DIAGNOSIS — I69352 Hemiplegia and hemiparesis following cerebral infarction affecting left dominant side: Secondary | ICD-10-CM

## 2019-09-15 DIAGNOSIS — R2689 Other abnormalities of gait and mobility: Secondary | ICD-10-CM

## 2019-09-15 DIAGNOSIS — R41841 Cognitive communication deficit: Secondary | ICD-10-CM

## 2019-09-15 DIAGNOSIS — I6381 Other cerebral infarction due to occlusion or stenosis of small artery: Secondary | ICD-10-CM

## 2019-09-15 NOTE — Therapy (Signed)
[facility] [address] [place], [name], [address] Phone: [phone]   Fax:  [phone]  Speech Language Pathology Treatment  Patient Details  Name: Russell Russell MRN: [id number] Date of Birth: [birth year] Referring Provider (SLP): Stann Mainland, [place]   Encounter Date: 09/15/2019   End of Session - 09/15/19 1257    Visit Number 7    Number of Visits 17    Date for SLP Re-Evaluation 11/05/19    SLP Start Time 1103    SLP Stop Time  6378    SLP Time Calculation (min) 42 min           Past Medical History:  Diagnosis Date  . Alcohol abuse    drinks 1 gallon of Gin every weekend, nothing during the week x >15 yrs  . Elevated transaminase level    +elev bili: abd u/s 06/2014 showed stable small hepatic hemangiomas, o/w normal.  . Essential hypertension   . GERD (gastroesophageal reflux disease)   . Hyperlipidemia 03/2014   Atorv started-chol improved  . Impaired fasting glucose 03/2014  . Nephrolithiasis   . PUD (peptic ulcer disease)   . Stroke ([place])   . Tobacco dependence    chantix: "psych effects"    Past Surgical History:  Procedure Laterality Date  . COLONOSCOPY  2005   Recall 10 yrs ([place])  . LOOP RECORDER INSERTION N/A 07/03/2019   Procedure: LOOP RECORDER INSERTION;  Surgeon: Thompson Grayer, MD;  Location: [facility];  Service: Cardiovascular;  Laterality: N/A;  . removal of kidney stones  2006   cystoscopic--removed from ureter.  No prob since.    There were no vitals filed for this visit.   Subjective Assessment - 09/15/19 1104    Subjective Pt arrives with notebook.    Currently in Pain? No/denies                 ADULT SLP TREATMENT - 09/15/19 1138      General Information   Behavior/Cognition Alert;Cooperative;Pleasant mood      Treatment Provided   Treatment provided Cognitive-Linquistic      Cognitive-Linquistic Treatment   Treatment focused on  Cognition;Dysarthria    Skilled Treatment Cognition(27 minutes) Patient brought 3 ring binder with sections for ST, OT, PT. SLP assisted with organizing this and printed another calendar for pt; wife using the one given last session. Pt located current date and answered questions re: today's appointments with mildly extended time. SLP asked pt to cross days off each morning. Navigated to ST section with mod A for homework. Pt asked question about date of his next appointment and wife prompted pt to check calendar; mod cues to locate date of next appointment. (speech tx - ind) Pt performed EMST with good technique; burst of air heard in 100% of repetitions. Pt initially did not pause after first set of 5, stated, "I need something to write with." Subsequently tracked his reps independently. SLP increased pressure from 17 to 19 cm. Pt required usual mod cues for breath support/louder speech in word and phrase level tasks.       Assessment / Recommendations / Plan   Plan Continue with current plan of care      Progression Toward Goals   Progression toward goals Progressing toward goals            SLP Education - 09/15/19 1257    Education Details use of calendar, memory notebook    Person(s) Educated Patient;Spouse  Methods Explanation    Comprehension Verbalized understanding;Need further instruction            SLP Short Term Goals - 09/15/19 1258      SLP SHORT TERM GOAL #1   Title pt will demo the knowledge that he can use a memory system for daily events, ID'ing prospective and past events, medication management, and appoitmetn tracking, etc x 3 sessions    Period --   or 9 total visits, for all STGs   Status Not Met      SLP SHORT TERM GOAL #2   Title pt will demo selective atteniton for 5 mintues to perform simple cognitive lingiustic tasks in a min-mod noisy environment x2 sessions    Status Partially Met      SLP [place] #3   Title pt will tell SLP 2 congitive  deficits affecting his life at home post-CVA in two sessions    Status Deferred      SLP SHORT TERM GOAL #4   Title pt will incr expiratory muscle strength taken in first 2 ST sessions via spirometer    Baseline MEP 45 on 08/13/19    Time 1    Status Partially Met            SLP Long Term Goals - 09/15/19 1259      SLP LONG TERM GOAL #1   Title pt will demo simple conversation of 5 mintues with average mid 60s dB x 3 sessions    Time 4    Period Weeks   or 17 sessions   Status On-going      SLP LONG TERM GOAL #2   Title pt will demo selective atteniton for 7 mintues to perform simple cognitive lingiustic tasks in a min noisy environment x3 sessions    Time 4    Period Weeks    Status Revised      SLP LONG TERM GOAL #3   Title pt will demo WFL alternating attention in simple cognitve linguistic tasks with occasional min A in 3 sessions    Time 4    Period Weeks    Status On-going      SLP LONG TERM GOAL #4   Title pt will independnently access memory notebook/planner for appoitnment management, to-do-lists, recall of prospective and past events, etc in 3 sessions    Time 4    Period Weeks    Status On-going            Plan - 09/15/19 1257    Clinical Impression Statement Pt presents today with  cognitive communication deficits which contiue to affect his ability to be independent and at their current level will require consistent assistance from wife/family for most needs. Pt now has memory book with sections for all disciplines; mod cues to navigate this today. Pt also presents with reduced voice volume (low 60s dB most of the time), affecting intelligibility with wife at home. SLP recommends skilled ST to address cognitive linguistics and dysarthria c/b reduced voice volume.    Speech Therapy Frequency 2x / week    Duration --   8 weeks or 17 total visits   Treatment/Interventions Environmental controls;Other (comment);Compensatory techniques;Functional tasks;SLP  instruction and feedback;Cueing hierarchy;Cognitive reorganization;Patient/family education;Internal/external aids   EMST   Potential to Achieve Goals Fair    Potential Considerations Severity of impairments;Ability to learn/carryover information    Consulted and Agree with Plan of Care Patient;Family member/caregiver    Family Member Consulted wife  Patient will benefit from skilled therapeutic intervention in order to improve the following deficits and impairments:   Cognitive communication deficit  Dysarthria and anarthria    Problem List Patient Active Problem List   Diagnosis Date Noted  . AKI (acute kidney injury) ([place])   . Fever   . Sepsis without acute organ dysfunction ([name])   . Elevated BUN   . Blood pressure increase diastolic   . Labile blood pressure   . Leukopenia   . Benign essential HTN   . Hypoalbuminemia   . Cerebrovascular accident (CVA) of right basal ganglia ([place]) 07/07/2019  . Acute ischemic stroke ([place])   . Dyslipidemia   . Dysphagia, post-stroke   . Polycythemia   . Stroke ([place]) 06/30/2019  . Prediabetes 12/23/2016  . Elevated LFTs 12/23/2016  . Pure hypercholesterolemia 12/21/2016  . Alcoholism ([facility]) 12/21/2016  . Eczema 09/18/2013  . Essential hypertension 09/18/2013  . Gastroesophageal reflux disease without esophagitis 09/18/2013  . Tobacco abuse 09/18/2013   Deneise Lever, [place], [place] 09/15/2019, 1:00 PM  [facility] [address] [place], [name], [address] Phone: [phone]   Fax:  [phone]   Name: Russell Russell MRN: [id number] Date of Birth: 05/14/1950

## 2019-09-15 NOTE — Therapy (Signed)
[facility] [address] [place], [name], [address] Phone: [phone]   Fax:  [phone]  Occupational Therapy Treatment  Patient Details  Name: Russell Russell MRN: [id number] Date of Birth: Feb 19, 1951 Referring Provider (OT): [name]   Encounter Date: 09/15/2019   OT End of Session - 09/15/19 1420    Visit Number 3    Number of Visits 17    Authorization Type BCBS? "Follow Medicare Guidelines"    Progress Note Due on Visit 10    OT Start Time 1230    OT Stop Time 1315    OT Time Calculation (min) 45 min    Activity Tolerance Patient tolerated treatment well    Behavior During Therapy [facility] for tasks assessed/performed           Past Medical History:  Diagnosis Date  . Alcohol abuse    drinks 1 gallon of Gin every weekend, nothing during the week x >15 yrs  . Elevated transaminase level    +elev bili: abd u/s 06/2014 showed stable small hepatic hemangiomas, o/w normal.  . Essential hypertension   . GERD (gastroesophageal reflux disease)   . Hyperlipidemia 03/2014   Atorv started-chol improved  . Impaired fasting glucose 03/2014  . Nephrolithiasis   . PUD (peptic ulcer disease)   . Stroke ([place])   . Tobacco dependence    chantix: "psych effects"    Past Surgical History:  Procedure Laterality Date  . COLONOSCOPY  2005   Recall 10 yrs ([place])  . LOOP RECORDER INSERTION N/A 07/03/2019   Procedure: LOOP RECORDER INSERTION;  Surgeon: Thompson Grayer, MD;  Location: [facility];  Service: Cardiovascular;  Laterality: N/A;  . removal of kidney stones  2006   cystoscopic--removed from ureter.  No prob since.    There were no vitals filed for this visit.   Subjective Assessment - 09/15/19 1234    Subjective  I am moving around a little more -not laying down so much.    Patient is accompanied by: Family member    Pertinent History HTN, ETOH use, smoker, loop recorder    Currently in Pain?  No/denies    Pain Score 0-No pain                        OT Treatments/Exercises (OP) - 09/15/19 0001      ADLs   Bathing Simulated tub shower transfer with tub bench, walking with walker into bathroom.  Patient and wife have not yet walked together.  Pt just now has clearance to purchase walker.  Patient and wife will need practice together once he is cleared to walk at home.  Patient with significant perceptual issues, but does well with gentle handling and cueing to slow down, unweight a foot to move.        Neurological Re-education Exercises   Other Exercises 1 Neuromuscular reeducation to address sit to stand and stand balance.  Patient with tendency to shift weight backward,  With cueing for foot placement, patient better abe to shift forward and stand with better balance, relying less on UEs.      Other Exercises 2 Working on overhead reach and reach with grasp/release components in LUE.  Patient needs more distal control in wrist and digits to be able to manipulate small (1inch) objects efficiently with LUE.                    OT Education -  09/15/19 1419    Education Details Sit to stand alignment and balance, tub transfers using walker in bathroom    Person(s) Educated Patient;Spouse    Methods Explanation;Demonstration;Tactile cues;Verbal cues    Comprehension Need further instruction            OT Short Term Goals - 09/15/19 1422      OT SHORT TERM GOAL #1   Title Patient will compelte a home exercise program designed to improve functional use of LUE with mod cueing and set up due 09/05/19    Status Achieved      OT SHORT TERM GOAL #2   Title Patient will don a front opening shirt with fasteners (buttons/zipper) with mod assist    Status On-going      OT SHORT TERM GOAL #3   Title Patient will demonstrate ability to cut food on plate with min assist    Status On-going      OT SHORT TERM GOAL #4   Title Patient will demonstrate low reach,  grasp, release of lightweight small object (less than 1 lb) with min assist    Status Achieved             OT Long Term Goals - 09/11/19 1239      OT LONG TERM GOAL #1   Title Patient will complete an updated HEP to address LUE functioning due 10/05/19    Status On-going      OT LONG TERM GOAL #2   Title Patient will walk with assistive device into bathroom to use toilet with min assist    Status On-going      OT LONG TERM GOAL #3   Title Patient will transfer onto shower seat with min assist    Status On-going      OT LONG TERM GOAL #4   Title Patient will obtain and release a 2-3 lb object at chest height shelf using LUE and min assist    Status On-going                 Plan - 09/15/19 1420    Clinical Impression Statement Patent progressing well with functional mobility, blanace and active movement in LUE.  Patient has solid support from wife who is still on LOA from work to provide care for him    OT Frequency 2x / week    OT Duration 8 weeks    OT Treatment/Interventions Self-care/ADL training;Electrical Stimulation;Therapeutic exercise;Visual/perceptual remediation/compensation;Patient/family education;Splinting;Neuromuscular education;Moist Heat;Aquatic Therapy;Balance training;Therapeutic activities;Functional Mobility Training;Fluidtherapy;DME and/or AE instruction;Manual Therapy;Cognitive remediation/compensation    Plan Functional mobility sit to stand, stand balance, functional use of LUE, distal NMR LUE    Consulted and Agree with Plan of Care Patient;Family member/caregiver    Family Member Consulted Wife, [name]           Patient will benefit from skilled therapeutic intervention in order to improve the following deficits and impairments:           Visit Diagnosis: Hemiplegia and hemiparesis following cerebral infarction affecting left dominant side ([place])  Other lack of coordination  Apraxia  Other disturbances of skin  sensation  Unsteadiness on feet  Muscle weakness (generalized)  Attention and concentration deficit    Problem List Patient Active Problem List   Diagnosis Date Noted  . AKI (acute kidney injury) ([place])   . Fever   . Sepsis without acute organ dysfunction ([place])   . Elevated BUN   . Blood pressure increase diastolic   . Labile blood pressure   .  Leukopenia   . Benign essential HTN   . Hypoalbuminemia   . Cerebrovascular accident (CVA) of right basal ganglia ([place]) 07/07/2019  . Acute ischemic stroke ([place])   . Dyslipidemia   . Dysphagia, post-stroke   . Polycythemia   . Stroke ([place]) 06/30/2019  . Prediabetes 12/23/2016  . Elevated LFTs 12/23/2016  . Pure hypercholesterolemia 12/21/2016  . Alcoholism ([place]) 12/21/2016  . Eczema 09/18/2013  . Essential hypertension 09/18/2013  . Gastroesophageal reflux disease without esophagitis 09/18/2013  . Tobacco abuse 09/18/2013    Marlowe Sax M,OTR/L 09/15/2019, 2:24 PM  [facility] [address] [place], [name], [address] Phone: [phone]   Fax:  [phone]  Name: Russell Russell MRN: [id number] Date of Birth: December 20, 1950

## 2019-09-15 NOTE — Therapy (Addendum)
[facility] [address] [place], [name], [address] Phone: [phone]   Fax:  [phone]  Physical Therapy Progress Note  Patient Details  Name: Russell Russell MRN: [id number] Date of Birth: [birth year] Referring Provider (PT): Lauraine Rinne, PA-C   Encounter Date: 09/15/2019   PT End of Session - 09/15/19 1239    Visit Number 10    Number of Visits 17    Date for PT Re-Evaluation 11/04/19    Authorization Type BCBS (follow medicare guidelines, 10th visit PN)    Progress Note Due on Visit 10    PT Start Time 1145    PT Stop Time 1230    PT Time Calculation (min) 45 min    Equipment Utilized During Treatment Gait belt    Activity Tolerance Patient tolerated treatment well    Behavior During Therapy [facility] for tasks assessed/performed           Past Medical History:  Diagnosis Date  . Alcohol abuse    drinks 1 gallon of Gin every weekend, nothing during the week x >15 yrs  . Elevated transaminase level    +elev bili: abd u/s 06/2014 showed stable small hepatic hemangiomas, o/w normal.  . Essential hypertension   . GERD (gastroesophageal reflux disease)   . Hyperlipidemia 03/2014   Atorv started-chol improved  . Impaired fasting glucose 03/2014  . Nephrolithiasis   . PUD (peptic ulcer disease)   . Stroke ([place])   . Tobacco dependence    chantix: "psych effects"    Past Surgical History:  Procedure Laterality Date  . COLONOSCOPY  2005   Recall 10 yrs ([place])  . LOOP RECORDER INSERTION N/A 07/03/2019   Procedure: LOOP RECORDER INSERTION;  Surgeon: Thompson Grayer, MD;  Location: [facility];  Service: Cardiovascular;  Laterality: N/A;  . removal of kidney stones  2006   cystoscopic--removed from ureter.  No prob since.    There were no vitals filed for this visit.   Subjective Assessment - 09/15/19 1217    Subjective no new complaints or falls.    Patient is accompained by: Family member     Pertinent History PMH: HTN, alcohol abuse (ongoing use), HLD, GERD, PUD, tobacco use    Limitations Standing;Walking;House hold activities    Patient Stated Goals return to prior level of function, walk, "wheelchair is not apart of me" (wants to get out of the w/c)    Currently in Pain? No/denies                Gait training With tactile cues for weight shifting on L LE, verbal cues to improve left step length, and improve L hip/knee flexion during the gait. Goal driven walking: pt was asked to a physical stop: "walk to the stop sign" to increase his walking endurance. Pt ambulates with RW and wheelchair follow, CGA to min A on occaison 1 x 23 ft 1 x 85 ft, 1 x 64 feet  Sit to stand transfers: 5x, pt has posterior LOB Nustep: level 5, UE and LE: 53'                     PT Education - 09/15/19 1225    Education Details Pt and wife were both educated that he may be little more sore in his mucles from increased walking which is normal. Wife was encouraged to start looking for a rolling walker so they can perform short distance walking at home (resources given).  Wife was told to buy gait belt also. Wife was told to bring walker in so we can adjust it for him before they can start to use it at home for short distance ambulation.    Person(s) Educated Spouse    Methods Explanation    Comprehension Verbalized understanding            PT Short Term Goals - 09/03/19 1151      PT SHORT TERM GOAL #1   Title Patient will be independent with Initial HEP, with wifes assistance    Baseline Patient reports independence with HEP with wifes assistance at this time, reports completing exercises everyday.    Time 4    Period Weeks    Status Achieved    Target Date 09/03/19      PT SHORT TERM GOAL #2   Title Patient will demonstrate ability to complete stand static w/ LRAD and midline alignment x 4 minutes w/o LOB to demonstrate improved tolerance for ADL tasks    Baseline  able to stand for 3 minutes 53 secs with RW and proper alignment w/o LOB    Time 4    Period Weeks    Status On-going    Target Date 09/03/19      PT SHORT TERM GOAL #3   Title Patient will demo ability to complete stand pivot transfer w/ LRAD and Min A for improved functional mobility    Baseline completed with RW and min A    Time 4    Period Weeks    Status Achieved    Target Date 09/03/19      PT SHORT TERM GOAL #4   Title Patient will demo ability to ambulate 25 feet w/ LRAD and Min A for improved mobility    Baseline ambulate 35 ft w/ RW and Min A    Time 4    Period Weeks    Status Achieved    Target Date 09/03/19             PT Long Term Goals - 08/06/19 2057      PT LONG TERM GOAL #1   Title Patient will be independent with Final HEP, with wifes assistance    Time 8    Period Weeks    Status New    Target Date 10/01/19      PT LONG TERM GOAL #2   Title Patient will demonstrate ability to complete transfer from w/c <> mat w/ CGA to demo improved independence with transfers.    Baseline Mod A    Time 8    Period Weeks    Status New    Target Date 10/01/19      PT LONG TERM GOAL #3   Title Patient will demonstrate ability to ambulate 50 ft w/ LRAD and min A to demonstrate improved household mobility    Time 8    Period Weeks    Status New    Target Date 10/01/19      PT LONG TERM GOAL #4   Title Patient will demonstrate ability to complete sit <> stand Mod I to demonstrate improved functional mobility    Time 8    Period Weeks    Status New    Target Date 10/01/19                 Plan - 09/15/19 1217    Clinical Impression Statement Today's session was focused on encouraging patient to improve his walking distance and endurance.  Patient was able to walk 23 ft, 85 ft, and 64 ft today with RW. When patient gets fatigued, his L foot drop is more notciable.    Personal Factors and Comorbidities Comorbidity 3+    Comorbidities HTN, alcohol abuse  (ongoing use), HLD, GERD, PUD, tobacco use    Examination-Activity Limitations Bed Mobility;Dressing;Locomotion Level;Stairs;Stand;Toileting;Transfers;Bathing    Examination-Participation Restrictions Driving;Interpersonal Relationship;Yard Work    [employer] Evolving/Moderate complexity    Rehab Potential Good    PT Frequency 2x / week    PT Duration 8 weeks    PT Treatment/Interventions ADLs/Self Care Home Management;Electrical Stimulation;DME Instruction;Moist Heat;Cryotherapy;Gait training;Stair training;Functional mobility training;Therapeutic activities;Therapeutic exercise;Balance training;Neuromuscular re-education;Patient/family education;Orthotic Fit/Training;Wheelchair mobility training;Passive range of motion    PT Next Visit Plan continue gait with RW, NMR for improved R lateral weight shift. transfers, working on AD management with turns, weight shifting activities, activities in tall kneeling.           Patient will benefit from skilled therapeutic intervention in order to improve the following deficits and impairments:  Abnormal gait, Decreased coordination, Difficulty walking, Impaired tone, Decreased safety awareness, Decreased endurance, Decreased activity tolerance, Pain, Decreased balance, Decreased knowledge of use of DME, Postural dysfunction, Impaired sensation, Decreased strength, Decreased mobility  Visit Diagnosis: Muscle weakness (generalized)  Unsteadiness on feet  Hemiplegia and hemiparesis following cerebral infarction affecting left dominant side ([facility])  Other abnormalities of gait and mobility  Cerebrovascular accident (CVA) of right basal ganglia [facility])     Problem List Patient Active Problem List   Diagnosis Date Noted  . AKI (acute kidney injury) ([place])   . Fever   . Sepsis without acute organ dysfunction ([place])   . Elevated BUN   . Blood pressure increase diastolic   . Labile blood pressure   . Leukopenia   . Benign  essential HTN   . Hypoalbuminemia   . Cerebrovascular accident (CVA) of right basal ganglia ([place]) 07/07/2019  . Acute ischemic stroke ([place])   . Dyslipidemia   . Dysphagia, post-stroke   . Polycythemia   . Stroke ([place]) 06/30/2019  . Prediabetes 12/23/2016  . Elevated LFTs 12/23/2016  . Pure hypercholesterolemia 12/21/2016  . Alcoholism ([place]) 12/21/2016  . Eczema 09/18/2013  . Essential hypertension 09/18/2013  . Gastroesophageal reflux disease without esophagitis 09/18/2013  . Tobacco abuse 09/18/2013    Kerrie Pleasure 09/15/2019, 12:43 PM  [facility] [address] [place], [name], [address] Phone: [phone]   Fax:  [phone]  Name: Russell Russell MRN: [id number] Date of Birth: Aug 28, 1950

## 2019-09-17 ENCOUNTER — Ambulatory Visit: Payer: BC Managed Care – PPO

## 2019-09-17 ENCOUNTER — Ambulatory Visit: Payer: BC Managed Care – PPO | Admitting: Occupational Therapy

## 2019-09-17 ENCOUNTER — Other Ambulatory Visit: Payer: Self-pay

## 2019-09-17 ENCOUNTER — Ambulatory Visit: Payer: BC Managed Care – PPO | Admitting: Speech Pathology

## 2019-09-17 ENCOUNTER — Encounter: Payer: Self-pay | Admitting: Occupational Therapy

## 2019-09-17 DIAGNOSIS — M6281 Muscle weakness (generalized): Secondary | ICD-10-CM | POA: Diagnosis not present

## 2019-09-17 DIAGNOSIS — I69352 Hemiplegia and hemiparesis following cerebral infarction affecting left dominant side: Secondary | ICD-10-CM

## 2019-09-17 DIAGNOSIS — R208 Other disturbances of skin sensation: Secondary | ICD-10-CM

## 2019-09-17 DIAGNOSIS — R2681 Unsteadiness on feet: Secondary | ICD-10-CM

## 2019-09-17 DIAGNOSIS — R41841 Cognitive communication deficit: Secondary | ICD-10-CM

## 2019-09-17 DIAGNOSIS — R471 Dysarthria and anarthria: Secondary | ICD-10-CM

## 2019-09-17 DIAGNOSIS — R278 Other lack of coordination: Secondary | ICD-10-CM

## 2019-09-17 NOTE — Therapy (Signed)
[facility] [address] [place], [name], [address] Phone: [phone]   Fax:  [phone]  Physical Therapy Treatment  Patient Details  Name: Russell Russell MRN: [id number] Date of Birth: [birth year] Referring Provider (PT): Lauraine Rinne, PA-C   Encounter Date: 09/17/2019   PT End of Session - 09/17/19 1215    Visit Number 11    Number of Visits 17    Date for PT Re-Evaluation 11/04/19    Authorization Type BCBS (follow medicare guidelines, 10th visit PN)    Progress Note Due on Visit 10    PT Start Time 1102    PT Stop Time 1145    PT Time Calculation (min) 43 min    Equipment Utilized During Treatment Gait belt    Activity Tolerance Patient tolerated treatment well    Behavior During Therapy WFL for tasks assessed/performed           Past Medical History:  Diagnosis Date  . Alcohol abuse    drinks 1 gallon of Gin every weekend, nothing during the week x >15 yrs  . Elevated transaminase level    +elev bili: abd u/s 06/2014 showed stable small hepatic hemangiomas, o/w normal.  . Essential hypertension   . GERD (gastroesophageal reflux disease)   . Hyperlipidemia 03/2014   Atorv started-chol improved  . Impaired fasting glucose 03/2014  . Nephrolithiasis   . PUD (peptic ulcer disease)   . Stroke ([place])   . Tobacco dependence    chantix: "psych effects"    Past Surgical History:  Procedure Laterality Date  . COLONOSCOPY  2005   Recall 10 yrs ([place])  . LOOP RECORDER INSERTION N/A 07/03/2019   Procedure: LOOP RECORDER INSERTION;  Surgeon: Thompson Grayer, MD;  Location: [facility];  Service: Cardiovascular;  Laterality: N/A;  . removal of kidney stones  2006   cystoscopic--removed from ureter.  No prob since.    There were no vitals filed for this visit.   Subjective Assessment - 09/17/19 1107    Subjective Patient reporting mild soreness after tuesday. Patient reports no pain or falls.      Patient is accompained by: Family member    Pertinent History PMH: HTN, alcohol abuse (ongoing use), HLD, GERD, PUD, tobacco use    Limitations Standing;Walking;House hold activities    Patient Stated Goals return to prior level of function, walk, "wheelchair is not apart of me" (wants to get out of the w/c)    Currently in Pain? No/denies                             [facility] Adult PT Treatment/Exercise - 09/17/19 0001      Transfers   Transfers Sit to Stand;Stand to Sit    Sit to Stand 5: Supervision    Sit to Stand Details Verbal cues for technique    Stand to Sit 5: Supervision    Comments completed x 8 reps from various surfaces (mat, w/c) throughout therapy session      Ambulation/Gait   Ambulation/Gait Yes    Ambulation/Gait Assistance 4: Min guard;4: Min assist    Ambulation/Gait Assistance Details Completed gait training with RW and PT on left side and transitioning posteriorly for manual faciliation for improved step length. Patietn able to ambulate 74 x 1, 63 x 1 with minimal cueing. Majority of cueing needed with turns for improved lateral stepping and proper management of AD. Completed last gait  training 30 ft x 1 with wife to further educate on proper gait wihtin the household. PT educating wife on use of gait belt, assistance needed, and techniques to improve safety within the household. Wife reports feeling comfortable with ambulating with the patient today. PT educating not to complete walking within the home until personal RW is recieved. Educated to bring RW to next session to ensure it is proper height, and continue gait training with wife for improved safety.     Ambulation Distance (Feet) 74 Feet   63 x 1, 30 x 1   Assistive device Rolling walker    Gait Pattern Step-through pattern;Step-to pattern;Decreased stride length;Decreased stance time - right;Decreased step length - left    Ambulation Surface Level;Indoor      Therapeutic Activites     Therapeutic Activities Other Therapeutic Activities    Other Therapeutic Activities Completed static standing at RW without support, with focus on maintaining balance, patient able to stand x 1 minute prior to needing rest break. Completed again with alternating UE flexion to further challenge static balance in standing, 1 x 10 reps bilaterally. Patient able to maintain balance throughout, with supervision. Mirror placed in front for visual feedback with posture/weight shift.       Neuro Re-ed    Neuro Re-ed Details  Standing at RW with BUE support, completed step ups leading with LLE on 4" step. Completed 1 x 10 reps, with verbal cues for sequencing and upright posture. Patient needing rest break due to BLE fatigue, followed with completion of 1 x 5 reps. Patient require CGA for steadying as needed.                      PT Short Term Goals - 09/03/19 1151      PT SHORT TERM GOAL #1   Title Patient will be independent with Initial HEP, with wifes assistance    Baseline Patient reports independence with HEP with wifes assistance at this time, reports completing exercises everyday.    Time 4    Period Weeks    Status Achieved    Target Date 09/03/19      PT SHORT TERM GOAL #2   Title Patient will demonstrate ability to complete stand static w/ LRAD and midline alignment x 4 minutes w/o LOB to demonstrate improved tolerance for ADL tasks    Baseline able to stand for 3 minutes 53 secs with RW and proper alignment w/o LOB    Time 4    Period Weeks    Status On-going    Target Date 09/03/19      PT SHORT TERM GOAL #3   Title Patient will demo ability to complete stand pivot transfer w/ LRAD and Min A for improved functional mobility    Baseline completed with RW and min A    Time 4    Period Weeks    Status Achieved    Target Date 09/03/19      PT SHORT TERM GOAL #4   Title Patient will demo ability to ambulate 25 feet w/ LRAD and Min A for improved mobility    Baseline  ambulate 35 ft w/ RW and Min A    Time 4    Period Weeks    Status Achieved    Target Date 09/03/19             PT Long Term Goals - 08/06/19 2057      PT LONG TERM GOAL #1  Title Patient will be independent with Final HEP, with wifes assistance    Time 8    Period Weeks    Status New    Target Date 10/01/19      PT LONG TERM GOAL #2   Title Patient will demonstrate ability to complete transfer from w/c <> mat w/ CGA to demo improved independence with transfers.    Baseline Mod A    Time 8    Period Weeks    Status New    Target Date 10/01/19      PT LONG TERM GOAL #3   Title Patient will demonstrate ability to ambulate 50 ft w/ LRAD and min A to demonstrate improved household mobility    Time 8    Period Weeks    Status New    Target Date 10/01/19      PT LONG TERM GOAL #4   Title Patient will demonstrate ability to complete sit <> stand Mod I to demonstrate improved functional mobility    Time 8    Period Weeks    Status New    Target Date 10/01/19                 Plan - 09/17/19 1216    Clinical Impression Statement Today's session included continued gait training, with PT allowing wife to practice walking with patient to provide assistance to allow for ambulation within the home once personal RW is recieved. PT provided education on safety, and giving patient a target/goal to walk towards within the home. Followed gait training with NMR and activties to promote static standing balance, and improve LLE weight shift and strengthening. Pt will continue to benefit from skilled PT services to progress toward all goals.    Personal Factors and Comorbidities Comorbidity 3+    Comorbidities HTN, alcohol abuse (ongoing use), HLD, GERD, PUD, tobacco use    Examination-Activity Limitations Bed Mobility;Dressing;Locomotion Level;Stairs;Stand;Toileting;Transfers;Bathing    Examination-Participation Restrictions Driving;Interpersonal Relationship;Yard Work     [employer] Evolving/Moderate complexity    Rehab Potential Good    PT Frequency 2x / week    PT Duration 8 weeks    PT Treatment/Interventions ADLs/Self Care Home Management;Electrical Stimulation;DME Instruction;Moist Heat;Cryotherapy;Gait training;Stair training;Functional mobility training;Therapeutic activities;Therapeutic exercise;Balance training;Neuromuscular re-education;Patient/family education;Orthotic Fit/Training;Wheelchair mobility training;Passive range of motion    PT Next Visit Plan IDid get personal RW? gait train with wife. continue gait with RW, NMR for improved R lateral weight shift. transfers, working on AD management with turns, weight shifting activities, activities in tall kneeling.           Patient will benefit from skilled therapeutic intervention in order to improve the following deficits and impairments:  Abnormal gait, Decreased coordination, Difficulty walking, Impaired tone, Decreased safety awareness, Decreased endurance, Decreased activity tolerance, Pain, Decreased balance, Decreased knowledge of use of DME, Postural dysfunction, Impaired sensation, Decreased strength, Decreased mobility  Visit Diagnosis: Hemiplegia and hemiparesis following cerebral infarction affecting left dominant side (HCC)  Other disturbances of skin sensation  Unsteadiness on feet  Muscle weakness (generalized)     Problem List Patient Active Problem List   Diagnosis Date Noted  . AKI (acute kidney injury) ([place])   . Fever   . Sepsis without acute organ dysfunction ([place])   . Elevated BUN   . Blood pressure increase diastolic   . Labile blood pressure   . Leukopenia   . Benign essential HTN   . Hypoalbuminemia   . Cerebrovascular accident (CVA) of right basal ganglia ([place])  07/07/2019  . Acute ischemic stroke ([place])   . Dyslipidemia   . Dysphagia, post-stroke   . Polycythemia   . Stroke ([place]) 06/30/2019  . Prediabetes 12/23/2016  . Elevated  LFTs 12/23/2016  . Pure hypercholesterolemia 12/21/2016  . Alcoholism ([place]) 12/21/2016  . Eczema 09/18/2013  . Essential hypertension 09/18/2013  . Gastroesophageal reflux disease without esophagitis 09/18/2013  . Tobacco abuse 09/18/2013    Jones Bales, PT, DPT 09/17/2019, 12:20 PM  [facility] [address] [place], [name], [address] Phone: [phone]   Fax:  [phone]  Name: Raiford Fetterman MRN: [id number] Date of Birth: 12/06/1950

## 2019-09-17 NOTE — Therapy (Signed)
[facility] [address] [place], [name], [address] Phone: [phone]   Fax:  [phone]  Speech Language Pathology Treatment  Patient Details  Name: Russell Russell MRN: [id number] Date of Birth: 1951-01-04 Referring Provider (SLP): Stann Main[place], [place]   Encounter Date: 09/17/2019   End of Session - 09/17/19 1333    Visit Number 8    Number of Visits 17    Date for SLP Re-Evaluation 11/05/19    SLP Start Time 2725    SLP Stop Time  1225    SLP Time Calculation (min) 40 min    Activity Tolerance Patient tolerated treatment well           Past Medical History:  Diagnosis Date  . Alcohol abuse    drinks 1 gallon of Gin every weekend, nothing during the week x >15 yrs  . Elevated transaminase level    +elev bili: abd u/s 06/2014 showed stable small hepatic hemangiomas, o/w normal.  . Essential hypertension   . GERD (gastroesophageal reflux disease)   . Hyperlipidemia 03/2014   Atorv started-chol improved  . Impaired fasting glucose 03/2014  . Nephrolithiasis   . PUD (peptic ulcer disease)   . Stroke ([place])   . Tobacco dependence    chantix: "psych effects"    Past Surgical History:  Procedure Laterality Date  . COLONOSCOPY  2005   Recall 10 yrs ([place])  . LOOP RECORDER INSERTION N/A 07/03/2019   Procedure: LOOP RECORDER INSERTION;  Surgeon: Thompson Grayer, MD;  Location: [facility];  Service: Cardiovascular;  Laterality: N/A;  . removal of kidney stones  2006   cystoscopic--removed from ureter.  No prob since.    There were no vitals filed for this visit.   Subjective Assessment - 09/17/19 1149    Subjective 'It went well," re: RMT, which was increased last session    Currently in Pain? No/denies                 ADULT SLP TREATMENT - 09/17/19 1150      General Information   Behavior/Cognition Alert;Cooperative;Pleasant mood      Treatment Provided   Treatment provided  Cognitive-Linquistic      Pain Assessment   Pain Assessment No/denies pain      Cognitive-Linquistic Treatment   Treatment focused on Dysarthria    Skilled Treatment SLP focused session on pt's dysarthria, (targeting cognition indirectly). EMST log showed practice 2/2 days since last session. Pt completed 25 reps (5x5) at 19cm while tracking in his log with mod I. SLP had pt complete one extra set with hand on abdomen while focusing on abdominal recruitment, then had pt use this same effort in speech tasks. At word level, occasional min A required. With phrases, sentences with cognitive load, patient required usual mod cues for effort, louder speech. (average was low 60s dB, mid 60s with cues). Occasional cues required for attention, simple reasoning. Gave pt list of phrases to practice loud speech at home.       Assessment / Recommendations / Plan   Plan Continue with current plan of care      Progression Toward Goals   Progression toward goals Progressing toward goals            SLP Education - 09/17/19 1332    Education Details loud speech practice daily    Person(s) Educated Patient;Spouse    Methods Explanation    Comprehension Verbalized understanding  SLP Short Term Goals - 09/17/19 1150      SLP SHORT TERM GOAL #1   Title pt will demo the knowledge that he can use a memory system for daily events, ID'ing prospective and past events, medication management, and appoitmetn tracking, etc x 3 sessions    Period --   or 9 total visits, for all STGs   Status Not Met      SLP SHORT TERM GOAL #2   Title pt will demo selective atteniton for 5 mintues to perform simple cognitive lingiustic tasks in a min-mod noisy environment x2 sessions    Status Partially Met      SLP SHORT TERM GOAL #3   Title pt will tell SLP 2 congitive deficits affecting his life at home post-CVA in two sessions    Status Deferred      SLP SHORT TERM GOAL #4   Title pt will incr expiratory  muscle strength taken in first 2 ST sessions via spirometer    Baseline MEP 45 on 08/13/19    Time 1    Status Partially Met            SLP Long Term Goals - 09/17/19 1150      SLP LONG TERM GOAL #1   Title pt will demo simple conversation of 5 mintues with average mid 60s dB x 3 sessions    Time 4    Period Weeks   or 17 sessions   Status On-going      SLP LONG TERM GOAL #2   Title pt will demo selective atteniton for 7 mintues to perform simple cognitive lingiustic tasks in a min noisy environment x3 sessions    Time 4    Period Weeks    Status Revised      SLP LONG TERM GOAL #3   Title pt will demo WFL alternating attention in simple cognitve linguistic tasks with occasional min A in 3 sessions    Time 4    Period Weeks    Status On-going      SLP LONG TERM GOAL #4   Title pt will independnently access memory notebook/planner for appoitnment management, to-do-lists, recall of prospective and past events, etc in 3 sessions    Time 4    Period Weeks    Status On-going            Plan - 09/17/19 1333    Clinical Impression Statement Pt presents today with  cognitive communication deficits which contiue to affect his ability to be independent and at their current level will require consistent assistance from wife/family for most needs. Pt now has memory book with sections for all disciplines; mod cues to navigate this today. Pt also presents with reduced voice volume (low 60s dB most of the time), affecting intelligibility with wife at home. SLP recommends skilled ST to address cognitive linguistics and dysarthria c/b reduced voice volume.    Speech Therapy Frequency 2x / week    Duration --   8 weeks or 17 total visits   Treatment/Interventions Environmental controls;Other (comment);Compensatory techniques;Functional tasks;SLP instruction and feedback;Cueing hierarchy;Cognitive reorganization;Patient/family education;Internal/external aids   EMST   Potential to Achieve Goals  Fair    Potential Considerations Severity of impairments;Ability to learn/carryover information    Consulted and Agree with Plan of Care Patient;Family member/caregiver    Family Member Consulted wife           Patient will benefit from skilled therapeutic intervention in order to improve the  following deficits and impairments:   Dysarthria and anarthria  Cognitive communication deficit    Problem List Patient Active Problem List   Diagnosis Date Noted  . AKI (acute kidney injury) ([place])   . Fever   . Sepsis without acute organ dysfunction ([place])   . Elevated BUN   . Blood pressure increase diastolic   . Labile blood pressure   . Leukopenia   . Benign essential HTN   . Hypoalbuminemia   . Cerebrovascular accident (CVA) of right basal ganglia ([place]) 07/07/2019  . Acute ischemic stroke ([place])   . Dyslipidemia   . Dysphagia, post-stroke   . Polycythemia   . Stroke ([place]) 06/30/2019  . Prediabetes 12/23/2016  . Elevated LFTs 12/23/2016  . Pure hypercholesterolemia 12/21/2016  . Alcoholism ([place]) 12/21/2016  . Eczema 09/18/2013  . Essential hypertension 09/18/2013  . Gastroesophageal reflux disease without esophagitis 09/18/2013  . Tobacco abuse 09/18/2013   Deneise Lever, [place], CCC-SLP Speech-Language Pathologist   Aliene Altes 09/17/2019, 1:34 PM  [facility] [address] [place], [name], [address] Phone: [phone]   Fax:  [phone]   Name: Russell Russell MRN: [id number] Date of Birth: 10/31/1950

## 2019-09-17 NOTE — Patient Instructions (Signed)
Practice flipping playing cards with your left hand, focus on opening fingers wide, let your arm rest on the tabletop. Pick up dominoes to place on a plate, keep your left arm low, avoid hiking your shoulder.

## 2019-09-17 NOTE — Therapy (Signed)
[facility] [address] [place], [name], [address] Phone: [phone]   Fax:  [phone]  Occupational Therapy Treatment  Patient Details  Name: Russell Russell MRN: [id number] Date of Birth: 1950/12/28 Referring Provider (OT): [name]   Encounter Date: 09/17/2019   OT End of Session - 09/17/19 1635    Visit Number 4    Number of Visits 17    Authorization Type BCBS? "Follow Medicare Guidelines"    Authorization - Number of Visits 4    Progress Note Due on Visit 10    OT Start Time 1018    OT Stop Time 1100    OT Time Calculation (min) 42 min    Activity Tolerance Patient tolerated treatment well    Behavior During Therapy [facility] for tasks assessed/performed           Past Medical History:  Diagnosis Date  . Alcohol abuse    drinks 1 gallon of Gin every weekend, nothing during the week x >15 yrs  . Elevated transaminase level    +elev bili: abd u/s 06/2014 showed stable small hepatic hemangiomas, o/w normal.  . Essential hypertension   . GERD (gastroesophageal reflux disease)   . Hyperlipidemia 03/2014   Atorv started-chol improved  . Impaired fasting glucose 03/2014  . Nephrolithiasis   . PUD (peptic ulcer disease)   . Stroke ([place])   . Tobacco dependence    chantix: "psych effects"    Past Surgical History:  Procedure Laterality Date  . COLONOSCOPY  2005   Recall 10 yrs ([place])  . LOOP RECORDER INSERTION N/A 07/03/2019   Procedure: LOOP RECORDER INSERTION;  Surgeon: Thompson Grayer, MD;  Location: [facility];  Service: Cardiovascular;  Laterality: N/A;  . removal of kidney stones  2006   cystoscopic--removed from ureter.  No prob since.    There were no vitals filed for this visit.   Subjective Assessment - 09/17/19 1639    Currently in Pain? No/denies                 Treatment:Gentle mobs to left wrist followed by AA/ROM, and A/ROM wrist flexion/ extension and supination  pronation. Pt practiced cutting food using bilateral UE's, min difficulty, red foam grip issued for fork to make it easier to hold with LUE during cutting. (Pt demonstrates increased ease.) Functional use of LUE for low range reach to place and remove dowel pegs with LUE, min difficulty, min v.c to avoid malpositioning/ shoulder hike.               OT Education - 09/17/19 1637    Education Details beginning coordination HEP. See pt instructions   Person(s) Educated Patient    Methods Explanation;Demonstration;Verbal cues;Handout    Comprehension Verbalized understanding;Returned demonstration;Verbal cues required            OT Short Term Goals - 09/15/19 1422      OT SHORT TERM GOAL #1   Title Patient will compelte a home exercise program designed to improve functional use of LUE with mod cueing and set up due 09/05/19    Status Achieved      OT SHORT TERM GOAL #2   Title Patient will don a front opening shirt with fasteners (buttons/zipper) with mod assist    Status On-going      OT SHORT TERM GOAL #3   Title Patient will demonstrate ability to cut food on plate with min assist    Status On-going  OT SHORT TERM GOAL #4   Title Patient will demonstrate low reach, grasp, release of lightweight small object (less than 1 lb) with min assist    Status Achieved             OT Long Term Goals - 09/11/19 1239      OT LONG TERM GOAL #1   Title Patient will complete an updated HEP to address LUE functioning due 10/05/19    Status On-going      OT LONG TERM GOAL #2   Title Patient will walk with assistive device into bathroom to use toilet with min assist    Status On-going      OT LONG TERM GOAL #3   Title Patient will transfer onto shower seat with min assist    Status On-going      OT LONG TERM GOAL #4   Title Patient will obtain and release a 2-3 lb object at chest height shelf using LUE and min assist    Status On-going                   Patient will benefit from skilled therapeutic intervention in order to improve the following deficits and impairments:           Visit Diagnosis: Hemiplegia and hemiparesis following cerebral infarction affecting left dominant side (HCC)  Other lack of coordination  Other disturbances of skin sensation  Unsteadiness on feet  Muscle weakness (generalized)    Problem List Patient Active Problem List   Diagnosis Date Noted  . AKI (acute kidney injury) ([place])   . Fever   . Sepsis without acute organ dysfunction ([place])   . Elevated BUN   . Blood pressure increase diastolic   . Labile blood pressure   . Leukopenia   . Benign essential HTN   . Hypoalbuminemia   . Cerebrovascular accident (CVA) of right basal ganglia ([place]) 07/07/2019  . Acute ischemic stroke ([place])   . Dyslipidemia   . Dysphagia, post-stroke   . Polycythemia   . Stroke ([place]) 06/30/2019  . Prediabetes 12/23/2016  . Elevated LFTs 12/23/2016  . Pure hypercholesterolemia 12/21/2016  . Alcoholism ([place]) 12/21/2016  . Eczema 09/18/2013  . Essential hypertension 09/18/2013  . Gastroesophageal reflux disease without esophagitis 09/18/2013  . Tobacco abuse 09/18/2013    Averill Pons 09/17/2019, 4:39 PM  [facility] [address] [place], [name], [address] Phone: [phone]   Fax:  [phone]  Name: Nasier Thumm MRN: [id number] Date of Birth: 1950/03/28

## 2019-09-20 ENCOUNTER — Other Ambulatory Visit: Payer: Self-pay | Admitting: Family Medicine

## 2019-09-20 DIAGNOSIS — L309 Dermatitis, unspecified: Secondary | ICD-10-CM

## 2019-09-21 ENCOUNTER — Other Ambulatory Visit: Payer: Self-pay

## 2019-09-21 ENCOUNTER — Ambulatory Visit: Payer: BC Managed Care – PPO | Admitting: Occupational Therapy

## 2019-09-21 ENCOUNTER — Encounter: Payer: Self-pay | Admitting: Occupational Therapy

## 2019-09-21 ENCOUNTER — Ambulatory Visit: Payer: BC Managed Care – PPO

## 2019-09-21 DIAGNOSIS — R2681 Unsteadiness on feet: Secondary | ICD-10-CM

## 2019-09-21 DIAGNOSIS — R41841 Cognitive communication deficit: Secondary | ICD-10-CM

## 2019-09-21 DIAGNOSIS — R471 Dysarthria and anarthria: Secondary | ICD-10-CM

## 2019-09-21 DIAGNOSIS — R278 Other lack of coordination: Secondary | ICD-10-CM

## 2019-09-21 DIAGNOSIS — R208 Other disturbances of skin sensation: Secondary | ICD-10-CM

## 2019-09-21 DIAGNOSIS — R482 Apraxia: Secondary | ICD-10-CM

## 2019-09-21 DIAGNOSIS — M6281 Muscle weakness (generalized): Secondary | ICD-10-CM

## 2019-09-21 DIAGNOSIS — R41840 Attention and concentration deficit: Secondary | ICD-10-CM

## 2019-09-21 DIAGNOSIS — I69352 Hemiplegia and hemiparesis following cerebral infarction affecting left dominant side: Secondary | ICD-10-CM

## 2019-09-21 DIAGNOSIS — R2689 Other abnormalities of gait and mobility: Secondary | ICD-10-CM

## 2019-09-21 NOTE — Therapy (Signed)
[facility] [address] [place], [name], [address] Phone: [phone]   Fax:  [phone]  Speech Language Pathology Treatment  Patient Details  Name: Russell Russell MRN: [id number] Date of Birth: October 25, 1950 Referring Provider (SLP): Stann Mainland, [place]   Encounter Date: 09/21/2019   End of Session - 09/21/19 1745    Visit Number 9    Number of Visits 17    Date for SLP Re-Evaluation 11/05/19    SLP Start Time 9892    SLP Stop Time  1231    SLP Time Calculation (min) 40 min    Activity Tolerance Patient tolerated treatment well           Past Medical History:  Diagnosis Date  . Alcohol abuse    drinks 1 gallon of Gin every weekend, nothing during the week x >15 yrs  . Elevated transaminase level    +elev bili: abd u/s 06/2014 showed stable small hepatic hemangiomas, o/w normal.  . Essential hypertension   . GERD (gastroesophageal reflux disease)   . Hyperlipidemia 03/2014   Atorv started-chol improved  . Impaired fasting glucose 03/2014  . Nephrolithiasis   . PUD (peptic ulcer disease)   . Stroke ([place])   . Tobacco dependence    chantix: "psych effects"    Past Surgical History:  Procedure Laterality Date  . COLONOSCOPY  2005   Recall 10 yrs ([place])  . LOOP RECORDER INSERTION N/A 07/03/2019   Procedure: LOOP RECORDER INSERTION;  Surgeon: Thompson Grayer, MD;  Location: [facility];  Service: Cardiovascular;  Laterality: N/A;  . removal of kidney stones  2006   cystoscopic--removed from ureter.  No prob since.    There were no vitals filed for this visit.   Subjective Assessment - 09/21/19 1157    Subjective Wife brought EMST device today.    Patient is accompained by: --   wife [name]   Currently in Pain? No/denies                 ADULT SLP TREATMENT - 09/21/19 1200      General Information   Behavior/Cognition Alert;Cooperative;Pleasant mood      Treatment Provided    Treatment provided Cognitive-Linquistic      Cognitive-Linquistic Treatment   Treatment focused on Dysarthria    Skilled Treatment Pt used EMST device whie SLP assessed pt's effort and need for cueing for full breath and quick strong blowing. Appropriate sound heard 70% of the time - pt appeared unaware of when sound was not appropriate. SLP occasional min-mod cues for full breath and occasional min cues for "blowing out candles" instead of a drawn-out blow. With common phrases, pt req'd mod cues usually faded to min cues rarely for louder speech - SLP had pt use tactile cues of hand on abdomen. Pt completed sentences with 80% success using louder speech but as linguistic demand increased (still a simple lingujistic task) pt's cueing level had to incr as well. SLP educated wife she could ask pt to "speak louder" instead of "what?" at home - wife stated she just doesn't answer pt or respond to pt if he is speaking softly.       Assessment / Recommendations / Plan   Plan Continue with current plan of care   deferred alternating attention goal     Progression Toward Goals   Progression toward goals Progressing toward goals              SLP Short  Term Goals - 09/17/19 1150      SLP SHORT TERM GOAL #1   Title pt will demo the knowledge that he can use a memory system for daily events, ID'ing prospective and past events, medication management, and appoitmetn tracking, etc x 3 sessions    Period --   or 9 total visits, for all STGs   Status Not Met      SLP SHORT TERM GOAL #2   Title pt will demo selective atteniton for 5 mintues to perform simple cognitive lingiustic tasks in a min-mod noisy environment x2 sessions    Status Partially Met      SLP SHORT TERM GOAL #3   Title pt will tell SLP 2 congitive deficits affecting his life at home post-CVA in two sessions    Status Deferred      SLP SHORT TERM GOAL #4   Title pt will incr expiratory muscle strength taken in first 2 ST sessions via  spirometer    Baseline MEP 45 on 08/13/19    Time 1    Status Partially Met            SLP Long Term Goals - 09/21/19 1748      SLP LONG TERM GOAL #1   Title pt will demo simple conversation of 5 mintues with average mid 60s dB x 3 sessions    Time 3    Period Weeks   or 17 sessions   Status On-going      SLP LONG TERM GOAL #2   Title pt will demo selective atteniton for 7 mintues to perform simple cognitive lingiustic tasks in a min noisy environment x2 sessions    Time 3    Period Weeks    Status Revised      SLP LONG TERM GOAL #3   Title pt will demo WFL alternating attention in simple cognitve linguistic tasks with occasional min A in 3 sessions    Status Deferred      SLP LONG TERM GOAL #4   Title pt will independnently access memory notebook/planner for appoitnment management, to-do-lists, recall of prospective and past events, etc in 3 sessions    Time 4    Period Weeks    Status On-going            Plan - 09/21/19 1747    Clinical Impression Statement Pt presents today with  cognitive communication deficits which contiue to affect his ability to be independent and at their current level will require consistent assistance from wife/family for most needs. Pt req'd SLP mod assistance to access memory book to find appropriate sections.. Pt also presents with reduced voice volume (low 60s dB most of the time), affecting intelligibility with wife at home. SLP recommends skilled ST to address cognitive linguistics and dysarthria c/b reduced voice volume.    Speech Therapy Frequency 2x / week    Duration --   8 weeks or 17 total visits   Treatment/Interventions Environmental controls;Other (comment);Compensatory techniques;Functional tasks;SLP instruction and feedback;Cueing hierarchy;Cognitive reorganization;Patient/family education;Internal/external aids   EMST   Potential to Achieve Goals Fair    Potential Considerations Severity of impairments;Ability to learn/carryover  information    Consulted and Agree with Plan of Care Patient;Family member/caregiver    Family Member Consulted wife           Patient will benefit from skilled therapeutic intervention in order to improve the following deficits and impairments:   Dysarthria and anarthria  Cognitive communication deficit  Problem List Patient Active Problem List   Diagnosis Date Noted  . AKI (acute kidney injury) ([place])   . Fever   . Sepsis without acute organ dysfunction ([place])   . Elevated BUN   . Blood pressure increase diastolic   . Labile blood pressure   . Leukopenia   . Benign essential HTN   . Hypoalbuminemia   . Cerebrovascular accident (CVA) of right basal ganglia ([place]) 07/07/2019  . Acute ischemic stroke ([place])   . Dyslipidemia   . Dysphagia, post-stroke   . Polycythemia   . Stroke ([place]) 06/30/2019  . Prediabetes 12/23/2016  . Elevated LFTs 12/23/2016  . Pure hypercholesterolemia 12/21/2016  . Alcoholism ([place]) 12/21/2016  . Eczema 09/18/2013  . Essential hypertension 09/18/2013  . Gastroesophageal reflux disease without esophagitis 09/18/2013  . Tobacco abuse 09/18/2013    [facility] ,MS, CCC-SLP  09/21/2019, 5:50 PM  [facility] [address] [place], [name], [address] Phone: [phone]   Fax:  [phone]   Name: Russell Russell MRN: [id number] Date of Birth: 17-Jul-1950

## 2019-09-21 NOTE — Telephone Encounter (Signed)
Please review

## 2019-09-21 NOTE — Patient Instructions (Signed)
  Please complete the assigned speech therapy homework prior to your next session and return it to the speech therapist at your next visit.  

## 2019-09-21 NOTE — Telephone Encounter (Signed)
Ok to refill 

## 2019-09-21 NOTE — Therapy (Signed)
[facility] [address] [place], [name], [address] Phone: [phone]   Fax:  [phone]  Occupational Therapy Treatment  Patient Details  Name: Russell Russell MRN: [id number] Date of Birth: Jul 24, 1950 Referring Provider (OT): [name]   Encounter Date: 09/21/2019   OT End of Session - 09/21/19 1557    Visit Number 5    Number of Visits 17    Authorization Type BCBS? "Follow Medicare Guidelines"    Authorization - Number of Visits 5    Progress Note Due on Visit 10    OT Start Time 1230    OT Stop Time 1315    OT Time Calculation (min) 45 min    Activity Tolerance Patient tolerated treatment well    Behavior During Therapy [name] for tasks assessed/performed           Past Medical History:  Diagnosis Date  . Alcohol abuse    drinks 1 gallon of Gin every weekend, nothing during the week x >15 yrs  . Elevated transaminase level    +elev bili: abd u/s 06/2014 showed stable small hepatic hemangiomas, o/w normal.  . Essential hypertension   . GERD (gastroesophageal reflux disease)   . Hyperlipidemia 03/2014   Atorv started-chol improved  . Impaired fasting glucose 03/2014  . Nephrolithiasis   . PUD (peptic ulcer disease)   . Stroke ([place])   . Tobacco dependence    chantix: "psych effects"    Past Surgical History:  Procedure Laterality Date  . COLONOSCOPY  2005   Recall 10 yrs ([place])  . LOOP RECORDER INSERTION N/A 07/03/2019   Procedure: LOOP RECORDER INSERTION;  Surgeon: Thompson Grayer, MD;  Location: [facility];  Service: Cardiovascular;  Laterality: N/A;  . removal of kidney stones  2006   cystoscopic--removed from ureter.  No prob since.    There were no vitals filed for this visit.   Subjective Assessment - 09/21/19 1235    Subjective  Patient indicates he is dressing himself better at home. Patient indicates that he has pain off and on in left wrist    Patient is accompanied by:  Family member    Currently in Pain? No/denies    Pain Score 0-No pain                        OT Treatments/Exercises (OP) - 09/21/19 0001      ADLs   Bathing Simulated tub transfer walking with patient's walker.  Set walker to appropriate height.  Discussed walker safety with patient and wife.  Practiced tub and commode transfers in arrangement like their bathroom at home.  Wife did excellent job providing right amount of support and cueing.  Patient vewry open to feedback.                     OT Short Term Goals - 09/15/19 1422      OT SHORT TERM GOAL #1   Title Patient will compelte a home exercise program designed to improve functional use of LUE with mod cueing and set up due 09/05/19    Status Achieved      OT SHORT TERM GOAL #2   Title Patient will don a front opening shirt with fasteners (buttons/zipper) with mod assist    Status On-going      OT SHORT TERM GOAL #3   Title Patient will demonstrate ability to cut food on plate with min assist  Status On-going      OT SHORT TERM GOAL #4   Title Patient will demonstrate low reach, grasp, release of lightweight small object (less than 1 lb) with min assist    Status Achieved             OT Long Term Goals - 09/11/19 1239      OT LONG TERM GOAL #1   Title Patient will complete an updated HEP to address LUE functioning due 10/05/19    Status On-going      OT LONG TERM GOAL #2   Title Patient will walk with assistive device into bathroom to use toilet with min assist    Status On-going      OT LONG TERM GOAL #3   Title Patient will transfer onto shower seat with min assist    Status On-going      OT LONG TERM GOAL #4   Title Patient will obtain and release a 2-3 lb object at chest height shelf using LUE and min assist    Status On-going                 Plan - 09/21/19 1559    Clinical Impression Statement Patient continues to show steady improvement toward OT goals based on  improved strength, balance, and functional mobility.    Body Structure / Function / Physical Skills ADL;Coordination;Endurance;GMC;UE functional use;Sensation;Decreased knowledge of precautions;Balance;Body mechanics;Decreased knowledge of use of DME;Flexibility;IADL;Pain;Vision;Cardiopulmonary status limiting activity;Dexterity;FMC;Proprioception;Strength;Tone;ROM    OT Frequency 2x / week    OT Duration 8 weeks    OT Treatment/Interventions Self-care/ADL training;Electrical Stimulation;Therapeutic exercise;Visual/perceptual remediation/compensation;Patient/family education;Splinting;Neuromuscular education;Moist Heat;Aquatic Therapy;Balance training;Therapeutic activities;Functional Mobility Training;Fluidtherapy;DME and/or AE instruction;Manual Therapy;Cognitive remediation/compensation    Plan Ask if they tried the shower, Functional use of LUE, Distal NMR LUE    Consulted and Agree with Plan of Care Patient;Family member/caregiver    Family Member Consulted Wife, [name]           Patient will benefit from skilled therapeutic intervention in order to improve the following deficits and impairments:   Body Structure / Function / Physical Skills: ADL, Coordination, Endurance, GMC, UE functional use, Sensation, Decreased knowledge of precautions, Balance, Body mechanics, Decreased knowledge of use of DME, Flexibility, IADL, Pain, Vision, Cardiopulmonary status limiting activity, Dexterity, FMC, Proprioception, Strength, Tone, ROM       Visit Diagnosis: Unsteadiness on feet  Muscle weakness (generalized)  Other lack of coordination  Hemiplegia and hemiparesis following cerebral infarction affecting left dominant side (HCC)  Other disturbances of skin sensation  Apraxia  Attention and concentration deficit    Problem List Patient Active Problem List   Diagnosis Date Noted  . AKI (acute kidney injury) ([place])   . Fever   . Sepsis without acute organ dysfunction ([place])   . Elevated  BUN   . Blood pressure increase diastolic   . Labile blood pressure   . Leukopenia   . Benign essential HTN   . Hypoalbuminemia   . Cerebrovascular accident (CVA) of right basal ganglia ([place]) 07/07/2019  . Acute ischemic stroke ([place])   . Dyslipidemia   . Dysphagia, post-stroke   . Polycythemia   . Stroke ([place]) 06/30/2019  . Prediabetes 12/23/2016  . Elevated LFTs 12/23/2016  . Pure hypercholesterolemia 12/21/2016  . Alcoholism ([place]) 12/21/2016  . Eczema 09/18/2013  . Essential hypertension 09/18/2013  . Gastroesophageal reflux disease without esophagitis 09/18/2013  . Tobacco abuse 09/18/2013    Mariah Milling, OTR/L 09/21/2019, 4:00 PM  [facility] [address] [place], [name], [address] Phone: [phone]   Fax:  [phone]  Name: Russell Russell MRN: [id number] Date of Birth: [birth year]

## 2019-09-21 NOTE — Therapy (Signed)
[facility] [address] [place], [name], [address] Phone: [phone]   Fax:  [phone]  Physical Therapy Treatment  Patient Details  Name: Russell Russell MRN: [id number] Date of Birth: 1950/04/07 Referring Provider (PT): Lauraine Rinne, PA-C   Encounter Date: 09/21/2019   PT End of Session - 09/21/19 1320    Visit Number 12    Number of Visits 17    Date for PT Re-Evaluation 11/04/19    Authorization Type BCBS (follow medicare guidelines, 10th visit PN)    Progress Note Due on Visit 10    PT Start Time 1316    PT Stop Time 1359    PT Time Calculation (min) 43 min    Equipment Utilized During Treatment Gait belt    Activity Tolerance Patient tolerated treatment well    Behavior During Therapy WFL for tasks assessed/performed           Past Medical History:  Diagnosis Date  . Alcohol abuse    drinks 1 gallon of Gin every weekend, nothing during the week x >15 yrs  . Elevated transaminase level    +elev bili: abd u/s 06/2014 showed stable small hepatic hemangiomas, o/w normal.  . Essential hypertension   . GERD (gastroesophageal reflux disease)   . Hyperlipidemia 03/2014   Atorv started-chol improved  . Impaired fasting glucose 03/2014  . Nephrolithiasis   . PUD (peptic ulcer disease)   . Stroke ([place])   . Tobacco dependence    chantix: "psych effects"    Past Surgical History:  Procedure Laterality Date  . COLONOSCOPY  2005   Recall 10 yrs ([place])  . LOOP RECORDER INSERTION N/A 07/03/2019   Procedure: LOOP RECORDER INSERTION;  Surgeon: Thompson Grayer, MD;  Location: [facility];  Service: Cardiovascular;  Laterality: N/A;  . removal of kidney stones  2006   cystoscopic--removed from ureter.  No prob since.    There were no vitals filed for this visit.   Subjective Assessment - 09/21/19 1318    Subjective Patient got his RW and gait belt for use at home. Patient reports been doing good  since last visit. No pain.    Patient is accompained by: Family member    Pertinent History PMH: HTN, alcohol abuse (ongoing use), HLD, GERD, PUD, tobacco use    Limitations Standing;Walking;House hold activities    Patient Stated Goals return to prior level of function, walk, "wheelchair is not apart of me" (wants to get out of the w/c)    Currently in Pain? No/denies                             [facility] Adult PT Treatment/Exercise - 09/21/19 0001      Transfers   Transfers Sit to Stand;Stand to Sit    Sit to Stand 5: Supervision    Sit to Stand Details Verbal cues for technique    Sit to Stand Details (indicate cue type and reason) patient able to self correct hand placement today. completed with L hand placed on walker with R hand pushing from surface    Stand to Sit 5: Supervision    Comments Completed x 10 reps throughout session from mat.       Ambulation/Gait   Ambulation/Gait Yes    Ambulation/Gait Assistance 4: Min guard    Ambulation/Gait Assistance Details Completed gait training with patient's personal RW. PT adjusting RW for appropriate height for patient.  All gait training completed with wife standing on patient's L side, with focus on improved comfort level with ambulating with patient to promote ambulation within the home. PT providing verbal cues for step length. Wife able to demonstrate appropriate assistance to allow for ambulation within the home.     Ambulation Distance (Feet) 58 Feet   60 x 1, 50 x 1   Assistive device Rolling walker    Gait Pattern Step-through pattern;Step-to pattern;Decreased stride length;Decreased stance time - right;Decreased step length - left    Ambulation Surface Level;Indoor    Stairs Yes    Stairs Assistance 4: Min guard    Stairs Assistance Details (indicate cue type and reason) PT providing verbal cues for proper sequencing with RW, and stepping pattern including "stepping up with the good, down with the bad".     Stair  Management Technique With walker   completed with rolling walker   Number of Stairs 1   utilized 4 inch step   Height of Stairs 4    Gait Comments PT continued to provide verbal cues to ensure proper turns and aligning up with surface prior to descent to sitting position. PT educating on complete short distance ambulation (<50 ft) within the home and giving a target to ambulate towards. PT placing tennis balls on patient's personal RW for improved propulsion.       Self-Care   Self-Care Other Self-Care Comments    Other Self-Care Comments  PT educating on ways to improve safety with household mobility. Including clearing path in which you plan to ambulate on, ensuring that you have a target to ambulate towards (chair/couch). PT educating not to attempt ambulating outdoors at this time, to still use w/c for outdoor mobility.                   PT Education - 09/21/19 1410    Education Details Educated on [employer] (see treatment notes; self care for more details)    Person(s) Educated Patient;Spouse    Methods Explanation;Demonstration    Comprehension Verbalized understanding            PT Short Term Goals - 09/03/19 1151      PT SHORT TERM GOAL #1   Title Patient will be independent with Initial HEP, with wifes assistance    Baseline Patient reports independence with HEP with wifes assistance at this time, reports completing exercises everyday.    Time 4    Period Weeks    Status Achieved    Target Date 09/03/19      PT SHORT TERM GOAL #2   Title Patient will demonstrate ability to complete stand static w/ LRAD and midline alignment x 4 minutes w/o LOB to demonstrate improved tolerance for ADL tasks    Baseline able to stand for 3 minutes 53 secs with RW and proper alignment w/o LOB    Time 4    Period Weeks    Status On-going    Target Date 09/03/19      PT SHORT TERM GOAL #3   Title Patient will demo ability to complete stand pivot transfer w/ LRAD and Min A  for improved functional mobility    Baseline completed with RW and min A    Time 4    Period Weeks    Status Achieved    Target Date 09/03/19      PT SHORT TERM GOAL #4   Title Patient will demo ability to ambulate 25 feet w/ LRAD and  Min A for improved mobility    Baseline ambulate 35 ft w/ RW and Min A    Time 4    Period Weeks    Status Achieved    Target Date 09/03/19             PT Long Term Goals - 08/06/19 2057      PT LONG TERM GOAL #1   Title Patient will be independent with Final HEP, with wifes assistance    Time 8    Period Weeks    Status New    Target Date 10/01/19      PT LONG TERM GOAL #2   Title Patient will demonstrate ability to complete transfer from w/c <> mat w/ CGA to demo improved independence with transfers.    Baseline Mod A    Time 8    Period Weeks    Status New    Target Date 10/01/19      PT LONG TERM GOAL #3   Title Patient will demonstrate ability to ambulate 50 ft w/ LRAD and min A to demonstrate improved household mobility    Time 8    Period Weeks    Status New    Target Date 10/01/19      PT LONG TERM GOAL #4   Title Patient will demonstrate ability to complete sit <> stand Mod I to demonstrate improved functional mobility    Time 8    Period Weeks    Status New    Target Date 10/01/19                 Plan - 09/21/19 1425    Clinical Impression Statement Today's skilled PT session focused on extensive training with patient and wife with ambulation to promote safety and confidence with ambulation within the home. PT giving clearance to begin walking short distance ambulation (<50 ft) within the home for improved household mobility. Patient and wife verbalize they feel comfortable and safe with beginning to ambulate within the home with RW and gait belt. Patient will continue to benefit from skilled PT services to progress toward all goals.    Personal Factors and Comorbidities Comorbidity 3+    Comorbidities HTN,  alcohol abuse (ongoing use), HLD, GERD, PUD, tobacco use    Examination-Activity Limitations Bed Mobility;Dressing;Locomotion Level;Stairs;Stand;Toileting;Transfers;Bathing    Examination-Participation Restrictions Driving;Interpersonal Relationship;Yard Work    [employer]chant [employer] officer Evolving/Moderate complexity    Rehab Potential Good    PT Frequency 2x / week    PT Duration 8 weeks    PT Treatment/Interventions ADLs/Self Care Home Management;Electrical Stimulation;DME Instruction;Moist Heat;Cryotherapy;Gait training;Stair training;Functional mobility training;Therapeutic activities;Therapeutic exercise;Balance training;Neuromuscular re-education;Patient/family education;Orthotic Fit/Training;Wheelchair mobility training;Passive range of motion    PT Next Visit Plan How did walking in house go? continue gait with RW, NMR for improved R lateral weight shift. transfers, working on AD management with turns, weight shifting activities, activities in tall kneeling.           Patient will benefit from skilled therapeutic intervention in order to improve the following deficits and impairments:  Abnormal gait, Decreased coordination, Difficulty walking, Impaired tone, Decreased safety awareness, Decreased endurance, Decreased activity tolerance, Pain, Decreased balance, Decreased knowledge of use of DME, Postural dysfunction, Impaired sensation, Decreased strength, Decreased mobility  Visit Diagnosis: Unsteadiness on feet  Muscle weakness (generalized)  Other abnormalities of gait and mobility  Other lack of coordination     Problem List Patient Active Problem List   Diagnosis Date Noted  . AKI (acute  kidney injury) ([place])   . Fever   . Sepsis without acute organ dysfunction ([place])   . Elevated BUN   . Blood pressure increase diastolic   . Labile blood pressure   . Leukopenia   . Benign essential HTN   . Hypoalbuminemia   . Cerebrovascular accident (CVA) of right basal  ganglia ([place]) 07/07/2019  . Acute ischemic stroke ([place])   . Dyslipidemia   . Dysphagia, post-stroke   . Polycythemia   . Stroke ([place]) 06/30/2019  . Prediabetes 12/23/2016  . Elevated LFTs 12/23/2016  . Pure hypercholesterolemia 12/21/2016  . Alcoholism ([place]) 12/21/2016  . Eczema 09/18/2013  . Essential hypertension 09/18/2013  . Gastroesophageal reflux disease without esophagitis 09/18/2013  . Tobacco abuse 09/18/2013    Jones Bales, PT, DPT 09/21/2019, 2:29 PM  [facility] [address] [place], [name], [address] Phone: [phone]   Fax:  [phone]  Name: Russell Russell MRN: [id number] Date of Birth: [birth year]

## 2019-09-22 ENCOUNTER — Ambulatory Visit (INDEPENDENT_AMBULATORY_CARE_PROVIDER_SITE_OTHER): Payer: BC Managed Care – PPO | Admitting: Neurology

## 2019-09-22 ENCOUNTER — Encounter: Payer: Self-pay | Admitting: Neurology

## 2019-09-22 VITALS — BP 111/78 | HR 93 | Ht 70.0 in | Wt 159.0 lb

## 2019-09-22 DIAGNOSIS — I639 Cerebral infarction, unspecified: Secondary | ICD-10-CM

## 2019-09-22 DIAGNOSIS — I69354 Hemiplegia and hemiparesis following cerebral infarction affecting left non-dominant side: Secondary | ICD-10-CM | POA: Diagnosis not present

## 2019-09-22 NOTE — Progress Notes (Signed)
[facility] [address] [place]. [name] [address] [phone]       OFFICE CONSULT NOTE  Mr. Freman Lapage Date of Birth:  [birth year] Medical Record Number:  [id number]   Referring MD: Rosalin Hawking Reason for Referral: Stroke  HPI: Mr. Saber 69 year old male seen today for initial office consultation visit for stroke in April 2021. History is obtained from the patient and review of electronic medical records and I personally reviewed available imaging films in PACS.  He is a 69 year old male with past medical history of hypertension, hyperlipidemia, tobacco abuse and alcohol abuse who presented to [facility]2019 with left-sided weakness did not call for help right away.  And his wife notices in the evening she brought him to the hospital.  His NIH stroke scale was 9.  CT scan of the head showed no acute abnormality.  CT angiogram of the head and neck showed no large vessel occlusion or stenosis.  There is subtle right internal carotid artery bulb filling defect raising possibility of carotid web versus thrombus.  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  MRI scan showed multiple scattered posterior right basal ganglia, right cerebral, right cerebellar infarcts with multiple old lacune's in both basal ganglia, thalami, pons and left cerebellum.  Carotid ultrasound showed a focal area of the right ICA bulb possibly a thrombus versus web.  2D echo showed normal ejection fraction.  Lower extremity venous Dopplers was negative for DVT.  LDL cholesterol was 109 mg percent.  Hemoglobin A1c was 6.2.  Patient had loop recorder inserted to look for paroxysmal A. fib.  Patient was discharged on 3 months of aspirin and Plavix followed by aspirin alone.  Patient states he has done well since discharge.  He is quit smoking as well as drinking alcohol.  He is currently undergoing outpatient physical and occupational therapy.  He is walking with a walker but needs a  1 person assist.  He also needs some help with activities of daily living like cutting his food but he can eat himself.  Is tolerating aspirin and Plavix well with only minor bruising.  He has had no recurrent stroke or TIA symptoms.  ROS:   14 system review of systems is positive for weakness, gait difficulty, imbalance diminished fine motor skills all other symptoms negative PMH:  Past Medical History:  Diagnosis Date  . Alcohol abuse    drinks 1 gallon of Gin every weekend, nothing during the week x >15 yrs  . Elevated transaminase level    +elev bili: abd u/s 06/2014 showed stable small hepatic hemangiomas, o/w normal.  . Essential hypertension   . GERD (gastroesophageal reflux disease)   . Hyperlipidemia 03/2014   Atorv started-chol improved  . Impaired fasting glucose 03/2014  . Nephrolithiasis   . PUD (peptic ulcer disease)   . Stroke ([place])   . Tobacco dependence    chantix: "psych effects"    Social History:  Social History   Socioeconomic History  . Marital status: Married    Spouse name: Not on file  . Number of children: Not on file  . Years of education: Not on file  . Highest education level: Not on file  Occupational History  . Not on file  Tobacco Use  . Smoking status: Former Smoker    Packs/day: 0.50    Years: 20.00    Pack years: 10.00    Types: Cigarettes  . Smokeless tobacco: Never Used  Substance and  Sexual Activity  . Alcohol use: Yes    Alcohol/week: 8.0 standard drinks    Types: 8 Cans of beer per week    Comment: Weekends only  . Drug use: No  . Sexual activity: Not on file  Other Topics Concern  . Not on file  Social History Narrative   Married, no children.   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   Tob: 10 pack-yr hx (current as of 03/2014).   Alcohol: 8 beers and pint of whisky on weekends only.   Denies hx of prob with drugs or alcohol.   Social Determinants of Health   Financial  Resource Strain:   . Difficulty of Paying Living Expenses:   Food Insecurity:   . Worried About Charity fundraiser in the Last Year:   . Arboriculturist in the Last Year:   Transportation Needs:   . Film/video editor (Medical):   Marland Kitchen Lack of Transportation (Non-Medical):   Physical Activity:   . Days of Exercise per Week:   . Minutes of Exercise per Session:   Stress:   . Feeling of Stress :   Social Connections:   . Frequency of Communication with Friends and Family:   . Frequency of Social Gatherings with Friends and Family:   . Attends Religious Services:   . Active Member of Clubs or Organizations:   . Attends Archivist Meetings:   Marland Kitchen Marital Status:   Intimate Partner Violence:   . Fear of Current or Ex-Partner:   . Emotionally Abused:   Marland Kitchen Physically Abused:   . Sexually Abused:     Medications:   Current Outpatient Medications on File Prior to Visit  Medication Sig Dispense Refill  . acetaminophen (TYLENOL) 325 MG tablet Take 2 tablets (650 mg total) by mouth every 4 (four) hours as needed for mild pain (or temp > 37.5 C (99.5 F)).    Marland Kitchen aspirin 325 MG tablet Take 1 tablet (325 mg total) by mouth daily.    Marland Kitchen atorvastatin (LIPITOR) 40 MG tablet Take 1 tablet (40 mg total) by mouth daily. 90 tablet 1  . folic acid (FOLVITE) 1 MG tablet Take 1 tablet (1 mg total) by mouth daily. 30 tablet 3  . halobetasol (ULTRAVATE) 0.05 % cream APPLY TO AFFECTED AREA TWICE A DAY 50 g 3  . metoprolol tartrate (LOPRESSOR) 25 MG tablet Take 1 tablet (25 mg total) by mouth 2 (two) times daily. (Patient taking differently: Take 25 mg by mouth daily. ) 60 tablet 0  . Multiple Vitamin (MULTIVITAMIN WITH MINERALS) TABS tablet Take 1 tablet by mouth daily.    . nicotine (NICODERM CQ - DOSED IN MG/24 HOURS) 14 mg/24hr patch 14 mg patch daily x3 weeks then 7 mg patch daily x3 weeks and stop 28 patch 0  . omeprazole (PRILOSEC) 20 MG capsule Take 20 mg by mouth as needed (acid reflux).      No current facility-administered medications on file prior to visit.    Allergies:   Allergies  Allergen Reactions  . Penicillins Rash    Physical Exam General: well developed, well nourished middle-aged male, seated, in no evident distress Head: head normocephalic and atraumatic.   Neck: supple with no carotid or supraclavicular bruits Cardiovascular: regular rate and rhythm, no murmurs Musculoskeletal: no deformity Skin:  no rash/petichiae Vascular:  Normal pulses all extremities  Neurologic Exam Mental Status: Awake and fully alert. Oriented to place  and person only. Recent and remote memory diminished attention span, concentration and fund of knowledge slightly diminished mood and affect appropriate.  Cranial Nerves: Fundoscopic exam reveals sharp disc margins. Pupils equal, briskly reactive to light. Extraocular movements full without nystagmus. Visual fields full to confrontation. Hearing intact. Facial sensation intact. Left lower face weakness., tongue, palate moves normally and symmetrically.  Motor: Mild left hemiparesis with 3/5 strength in upper and lower extremities with distal greater than proximal weakness. Tone increased on the left compared to the right.. Sensory.: intact to touch , pinprick , position and vibratory sensation.  Coordination: Rapid alternating movements normal in all extremities. Finger-to-nose and heel-to-shin performed accurately bilaterally. Gait and Station: Arises from chair without difficulty. Stance is normal. Gait demonstrates mild dragging of the left leg. Y.  Reflexes: 1+ and symmetric. Toes downgoing.   NIHSS  6 Modified Rankin  3   ASSESSMENT: 69 year old male with recent right basal ganglia, right cerebellum infarcts likely of cryptogenic etiology. 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     PLAN:  I had a long d/w patient and his wife about his  recent cryptogenic stroke,left hemiparesis,risk for recurrent stroke/TIAs, personally independently reviewed imaging studies and stroke evaluation results and answered questions.Continue aspirin 81 mg daily  for secondary stroke prevention and stop Plavix now since it has been nearly 3 months since his stroke and maintain strict control of hypertension with blood pressure goal below 130/90, diabetes with hemoglobin A1c goal below 6.5% and lipids with LDL cholesterol goal below 70 mg/dL. I also advised the patient to eat a healthy diet with plenty of whole grains, cereals, fruits and vegetables, exercise regularly and maintain ideal body weight.  Continue ongoing outpatient physical and occupational therapy and we discussed fall safety precautions.  Repeat CT angiogram of the neck to follow-up on his right carotid web/clot for surveillance.  Patient may also consider possible participation in the [place] trial for stroke prevention if interested and will be given information to review and decide. Greater than 50% time during this 45-minute consultation was it was spent on counseling and coordination of care about his recent stroke and discussion about evaluation and treatment and prevention plan and answering questions followup in the future with my nurse practitioner Janett Billow in 3 months or call earlier if necessary. Antony Contras, MD  [facility] [address] [place], [place] [address]  Phone [phone] Fax [phone] Note: This document was prepared with digital dictation and possible smart phrase technology. Any transcriptional errors that result from this process are unintentional.

## 2019-09-22 NOTE — Patient Instructions (Signed)
I had a long d/w patient and his wife about his recent cryptogenic stroke,left hemiparesis,risk for recurrent stroke/TIAs, personally independently reviewed imaging studies and stroke evaluation results and answered questions.Continue aspirin 81 mg daily  for secondary stroke prevention and stop Plavix now since it has been nearly 3 months since his stroke and maintain strict control of hypertension with blood pressure goal below 130/90, diabetes with hemoglobin A1c goal below 6.5% and lipids with LDL cholesterol goal below 70 mg/dL. I also advised the patient to eat a healthy diet with plenty of whole grains, cereals, fruits and vegetables, exercise regularly and maintain ideal body weight.  Continue ongoing outpatient physical and occupational therapy and we discussed fall safety precautions.  Repeat CT angiogram of the neck to follow-up on his right carotid web/clot for surveillance.  Patient may also consider possible participation in the [place] trial for stroke prevention if interested and will be given information to review and decide.  Followup in the future with my nurse practitioner Janett Billow in 3 months or call earlier if necessary. . Stroke Prevention Some medical conditions and behaviors are associated with a higher chance of having a stroke. You can help prevent a stroke by making nutrition, lifestyle, and other changes, including managing any medical conditions you may have. What nutrition changes can be made?   Eat healthy foods. You can do this by: ? Choosing foods high in fiber, such as fresh fruits and vegetables and whole grains. ? Eating at least 5 or more servings of fruits and vegetables a day. Try to fill half of your plate at each meal with fruits and vegetables. ? Choosing lean protein foods, such as lean cuts of meat, poultry without skin, fish, tofu, beans, and nuts. ? Eating low-fat dairy products. ? Avoiding foods that are high in salt (sodium). This can help lower blood  pressure. ? Avoiding foods that have saturated fat, trans fat, and cholesterol. This can help prevent high cholesterol. ? Avoiding processed and premade foods.  Follow your health care provider's specific guidelines for losing weight, controlling high blood pressure (hypertension), lowering high cholesterol, and managing diabetes. These may include: ? Reducing your daily calorie intake. ? Limiting your daily sodium intake to 1,500 milligrams (mg). ? Using only healthy fats for cooking, such as olive oil, canola oil, or sunflower oil. ? Counting your daily carbohydrate intake. What lifestyle changes can be made?  Maintain a healthy weight. Talk to your health care provider about your ideal weight.  Get at least 30 minutes of moderate physical activity at least 5 days a week. Moderate activity includes brisk walking, biking, and swimming.  Do not use any products that contain nicotine or tobacco, such as cigarettes and e-cigarettes. If you need help quitting, ask your health care provider. It may also be helpful to avoid exposure to secondhand smoke.  Limit alcohol intake to no more than 1 drink a day for nonpregnant women and 2 drinks a day for men. One drink equals 12 oz of beer, 5 oz of wine, or 1 oz of hard liquor.  Stop any illegal drug use.  Avoid taking birth control pills. Talk to your health care provider about the risks of taking birth control pills if: ? You are over 36 years old. ? You smoke. ? You get migraines. ? You have ever had a blood clot. What other changes can be made?  Manage your cholesterol levels. ? Eating a healthy diet is important for preventing high cholesterol. If cholesterol cannot be  managed through diet alone, you may also need to take medicines. ? Take any prescribed medicines to control your cholesterol as told by your health care provider.  Manage your diabetes. ? Eating a healthy diet and exercising regularly are important parts of managing your  blood sugar. If your blood sugar cannot be managed through diet and exercise, you may need to take medicines. ? Take any prescribed medicines to control your diabetes as told by your health care provider.  Control your hypertension. ? To reduce your risk of stroke, try to keep your blood pressure below 130/80. ? Eating a healthy diet and exercising regularly are an important part of controlling your blood pressure. If your blood pressure cannot be managed through diet and exercise, you may need to take medicines. ? Take any prescribed medicines to control hypertension as told by your health care provider. ? Ask your health care provider if you should monitor your blood pressure at home. ? Have your blood pressure checked every year, even if your blood pressure is normal. Blood pressure increases with age and some medical conditions.  Get evaluated for sleep disorders (sleep apnea). Talk to your health care provider about getting a sleep evaluation if you snore a lot or have excessive sleepiness.  Take over-the-counter and prescription medicines only as told by your health care provider. Aspirin or blood thinners (antiplatelets or anticoagulants) may be recommended to reduce your risk of forming blood clots that can lead to stroke.  Make sure that any other medical conditions you have, such as atrial fibrillation or atherosclerosis, are managed. What are the warning signs of a stroke? The warning signs of a stroke can be easily remembered as BEFAST.  B is for balance. Signs include: ? Dizziness. ? Loss of balance or coordination. ? Sudden trouble walking.  E is for eyes. Signs include: ? A sudden change in vision. ? Trouble seeing.  F is for face. Signs include: ? Sudden weakness or numbness of the face. ? The face or eyelid drooping to one side.  A is for arms. Signs include: ? Sudden weakness or numbness of the arm, usually on one side of the body.  S is for speech. Signs  include: ? Trouble speaking (aphasia). ? Trouble understanding.  T is for time. ? These symptoms may represent a serious problem that is an emergency. Do not wait to see if the symptoms will go away. Get medical help right away. Call your local emergency services (911 in the [place] Do not drive yourself to the hospital.  Other signs of stroke may include: ? A sudden, severe headache with no known cause. ? Nausea or vomiting. ? Seizure. Where to find more information For more information, visit:  American Stroke Association: www.strokeassociation.org  National Stroke Association: www.stroke.org Summary  You can prevent a stroke by eating healthy, exercising, not smoking, limiting alcohol intake, and managing any medical conditions you may have.  Do not use any products that contain nicotine or tobacco, such as cigarettes and e-cigarettes. If you need help quitting, ask your health care provider. It may also be helpful to avoid exposure to secondhand smoke.  Remember BEFAST for warning signs of stroke. Get help right away if you or a loved one has any of these signs. This information is not intended to replace advice given to you by your health care provider. Make sure you discuss any questions you have with your health care provider. Document Revised: 02/08/2017 Document Reviewed: 04/03/2016 Elsevier Patient Education  [place].

## 2019-09-23 ENCOUNTER — Telehealth: Payer: Self-pay | Admitting: Neurology

## 2019-09-23 NOTE — Telephone Encounter (Signed)
Medicare/bcbs pt has [facility] they will reach out to the patient to schedule faxed order

## 2019-09-24 ENCOUNTER — Other Ambulatory Visit: Payer: Self-pay

## 2019-09-24 ENCOUNTER — Ambulatory Visit: Payer: BC Managed Care – PPO | Admitting: Speech Pathology

## 2019-09-24 ENCOUNTER — Encounter: Payer: BC Managed Care – PPO | Attending: Registered Nurse | Admitting: Physical Medicine & Rehabilitation

## 2019-09-24 ENCOUNTER — Encounter: Payer: Self-pay | Admitting: Physical Medicine & Rehabilitation

## 2019-09-24 ENCOUNTER — Ambulatory Visit: Payer: BC Managed Care – PPO | Admitting: Occupational Therapy

## 2019-09-24 VITALS — BP 117/83 | HR 85 | Ht 70.0 in | Wt 159.0 lb

## 2019-09-24 DIAGNOSIS — R269 Unspecified abnormalities of gait and mobility: Secondary | ICD-10-CM | POA: Diagnosis not present

## 2019-09-24 DIAGNOSIS — I10 Essential (primary) hypertension: Secondary | ICD-10-CM

## 2019-09-24 DIAGNOSIS — R41841 Cognitive communication deficit: Secondary | ICD-10-CM

## 2019-09-24 DIAGNOSIS — R471 Dysarthria and anarthria: Secondary | ICD-10-CM

## 2019-09-24 DIAGNOSIS — I639 Cerebral infarction, unspecified: Secondary | ICD-10-CM

## 2019-09-24 DIAGNOSIS — R208 Other disturbances of skin sensation: Secondary | ICD-10-CM

## 2019-09-24 DIAGNOSIS — R7303 Prediabetes: Secondary | ICD-10-CM | POA: Diagnosis not present

## 2019-09-24 DIAGNOSIS — R278 Other lack of coordination: Secondary | ICD-10-CM

## 2019-09-24 DIAGNOSIS — M6281 Muscle weakness (generalized): Secondary | ICD-10-CM

## 2019-09-24 DIAGNOSIS — I69352 Hemiplegia and hemiparesis following cerebral infarction affecting left dominant side: Secondary | ICD-10-CM

## 2019-09-24 DIAGNOSIS — I6381 Other cerebral infarction due to occlusion or stenosis of small artery: Secondary | ICD-10-CM

## 2019-09-24 DIAGNOSIS — R0989 Other specified symptoms and signs involving the circulatory and respiratory systems: Secondary | ICD-10-CM

## 2019-09-24 NOTE — Progress Notes (Signed)
Subjective:    Patient ID: Russell Russell, male    DOB: 1950/04/19, 69 y.o.   MRN: [id number]  HPI Right-handed male with history of alcohol and tobacco use, hyperlipidemia presents for hospital follow-up after receiving CIR for multiple anterior and posterior right brain infarcts.  Wife supplements history. He was last seen in clinic on 08/17/2019 by NP, notes reviewed.  Since that time, he has had issues with blood pressure/hypotension-notes reviewed. He saw Cards and Neurology, since discharge. He is still in therapies. BP is controlled. Denies falls. His plavix was d/ced. No issues with swallowing. He denies Etoh/tobaccco use.  Pain Inventory Average Pain 0 Pain Right Now 0 My pain is no pain  In the last 24 hours, has pain interfered with the following? General activity 0 Relation with others 0 Enjoyment of life 0 What TIME of day is your pain at its worst? no pain Sleep (in general) Good  Pain is worse with: no pain Pain improves with: no pain Relief from Meds: no pain  Mobility use a wheelchair  Function retired  Neuro/Psych trouble walking  Prior Studies Any changes since last visit?  no  Physicians involved in your care Any changes since last visit?  no   Family History  Problem Relation Age of Onset  . Colon cancer Mother   . Cancer Father   . Breast cancer Sister   . Breast cancer Sister    Social History   Socioeconomic History  . Marital status: Married    Spouse name: Not on file  . Number of children: Not on file  . Years of education: Not on file  . Highest education level: Not on file  Occupational History  . Not on file  Tobacco Use  . Smoking status: Former Smoker    Packs/day: 0.50    Years: 20.00    Pack years: 10.00    Types: Cigarettes  . Smokeless tobacco: Never Used  Substance and Sexual Activity  . Alcohol use: Yes    Alcohol/week: 8.0 standard drinks    Types: 8 Cans of beer per week    Comment: Weekends only  . Drug  use: No  . Sexual activity: Not on file  Other Topics Concern  . Not on file  Social History Narrative   Married, no children.   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   Tob: 10 pack-yr hx (current as of 03/2014).   Alcohol: 8 beers and pint of whisky on weekends only.   Denies hx of prob with drugs or alcohol.   Social Determinants of Health   Financial Resource Strain:   . Difficulty of Paying Living Expenses:   Food Insecurity:   . Worried About Charity fundraiser in the Last Year:   . Arboriculturist in the Last Year:   Transportation Needs:   . Film/video editor (Medical):   Marland Kitchen Lack of Transportation (Non-Medical):   Physical Activity:   . Days of Exercise per Week:   . Minutes of Exercise per Session:   Stress:   . Feeling of Stress :   Social Connections:   . Frequency of Communication with Friends and Family:   . Frequency of Social Gatherings with Friends and Family:   . Attends Religious Services:   . Active Member of Clubs or Organizations:   . Attends Archivist Meetings:   Marland Kitchen Marital Status:    Past Surgical History:  Procedure Laterality Date  . COLONOSCOPY  2005   Recall 10 yrs ([place])  . LOOP RECORDER INSERTION N/A 07/03/2019   Procedure: LOOP RECORDER INSERTION;  Surgeon: Thompson Grayer, MD;  Location: [facility];  Service: Cardiovascular;  Laterality: N/A;  . removal of kidney stones  2006   cystoscopic--removed from ureter.  No prob since.   Past Medical History:  Diagnosis Date  . Alcohol abuse    drinks 1 gallon of Gin every weekend, nothing during the week x >15 yrs  . Elevated transaminase level    +elev bili: abd u/s 06/2014 showed stable small hepatic hemangiomas, o/w normal.  . Essential hypertension   . GERD (gastroesophageal reflux disease)   . Hyperlipidemia 03/2014   Atorv started-chol improved  . Impaired fasting glucose 03/2014  . Nephrolithiasis   . PUD (peptic  ulcer disease)   . Stroke ([place])   . Tobacco dependence    chantix: "psych effects"   BP 117/83   Pulse 85   Ht 5\' 10"  (1.778 m)   Wt 159 lb (72.1 kg)   SpO2 97%   BMI 22.81 kg/m   Opioid Risk Score:   Fall Risk Score:  `1  Depression screen PHQ 2/9  Depression screen [facility] 2/9 08/18/2019 08/17/2019 02/24/2019 12/21/2017 12/21/2016  Decreased Interest 0 1 0 0 0  Down, Depressed, Hopeless 0 0 0 0 0  PHQ - 2 Score 0 1 0 0 0  Altered sleeping - 0 - - -  Tired, decreased energy - 0 - - -  Change in appetite - 0 - - -  Feeling bad or failure about yourself  - 0 - - -  Trouble concentrating - 0 - - -  Moving slowly or fidgety/restless - 0 - - -  Suicidal thoughts - 0 - - -  PHQ-9 Score - 1 - - -    Review of Systems  Constitutional: Negative.   HENT: Negative.   Eyes: Negative.   Cardiovascular: Negative.   Gastrointestinal: Negative.   Endocrine: Negative.   Genitourinary: Negative.   Musculoskeletal: Positive for gait problem.  Skin: Negative.   Allergic/Immunologic: Negative.   Hematological: Negative.   Psychiatric/Behavioral: Negative.   All other systems reviewed and are negative.      Objective:   Physical Exam Constitutional: NAD. Vital signs reviewed. HENT: Normocephalic.  Atraumatic. Eyes: EOMI. No discharge. Cardiovascular: No JVD. Respiratory: Normal effort.  No stridor. GI: Non-distended. Skin: Warm and dry.  Intact. Psych: Normal mood.  Normal behavior. Musc: No edema in extremities.  No tenderness in extremities. Neuro: Alert Motor: Left upper extremity: Shoulder abduction 4/5, distally 4/5, with apraxia and ataxia, stable Left lower extremity: Hip flexion 4/5, knee extension 4 +/5, ankle dorsiflexion 4+/5, unchanged Dysarthria, stable Left facial weakness, improving    Assessment & Plan:  Right-handed male with history of alcohol and tobacco use, hyperlipidemia presents for hospital follow-up after receiving CIR for multiple anterior and posterior  right brain infarcts.  1.  Left side weakness secondary to multiple scattered anterior and posterior right brain infarcts.  Status post loop recorder             Continue therapies  Continue follow up with Neurology  Continue to follow up with Cards  2.  Blood pressure  Has been labile with hypotension, but appears to be improving  3.  History of tobacco and alcohol use.    Cont to abstain  4. Gait abnormality  Cont wheelchair, hopefully transitioning  to walker soon  5. Prediabetes   Discussed and encouraged diabetic diet

## 2019-09-24 NOTE — Therapy (Signed)
[facility] [address] [place], [name], [address] Phone: [phone]   Fax:  [phone]  Occupational Therapy Treatment  Patient Details  Name: Russell Russell MRN: [id number] Date of Birth: December 02, 1950 Referring Provider (OT): [name]   Encounter Date: 09/24/2019   OT End of Session - 09/24/19 1414    Visit Number 6    Number of Visits 17    Authorization Type BCBS? "Follow Medicare Guidelines"    Authorization - Number of Visits 6    Progress Note Due on Visit 10    OT Start Time 1148    OT Stop Time 1230    OT Time Calculation (min) 42 min    Activity Tolerance Patient tolerated treatment well    Behavior During Therapy [facility] for tasks assessed/performed           Past Medical History:  Diagnosis Date  . Alcohol abuse    drinks 1 gallon of Gin every weekend, nothing during the week x >15 yrs  . Elevated transaminase level    +elev bili: abd u/s 06/2014 showed stable small hepatic hemangiomas, o/w normal.  . Essential hypertension   . GERD (gastroesophageal reflux disease)   . Hyperlipidemia 03/2014   Atorv started-chol improved  . Impaired fasting glucose 03/2014  . Nephrolithiasis   . PUD (peptic ulcer disease)   . Stroke ([place])   . Tobacco dependence    chantix: "psych effects"    Past Surgical History:  Procedure Laterality Date  . COLONOSCOPY  2005   Recall 10 yrs ([place])  . LOOP RECORDER INSERTION N/A 07/03/2019   Procedure: LOOP RECORDER INSERTION;  Surgeon: Thompson Grayer, MD;  Location: [facility];  Service: Cardiovascular;  Laterality: N/A;  . removal of kidney stones  2006   cystoscopic--removed from ureter.  No prob since.    There were no vitals filed for this visit.   Subjective Assessment - 09/24/19 1414    Subjective  Patient indicates he is dressing himself better at home. Patient indicates that he has pain off and on in left wrist    Patient is accompanied by:  Family member    Currently in Pain? No/denies                Treatment: supine closed chain shoulder flexion and chest press, min facilitation. Seated low to mid range closed chain shoulder flexion, min facilitation v.c Seated low to mid range unilateral reach to place large pegs in vertical pegboard, min-mod difficulty, min v.c Standing at table to place yellow and red clothespins on vertical antenna, min A. Pt amb from mat to table with walker and min A. Pt's wife reports walking with him without using walker. Pt/ wife were encouraged to use walker for any ambulation.                  OT Short Term Goals - 09/24/19 1419      OT SHORT TERM GOAL #1   Title Patient will compelte a home exercise program designed to improve functional use of LUE with mod cueing and set up due 09/05/19    Status Achieved      OT SHORT TERM GOAL #2   Title Patient will don a front opening shirt with fasteners (buttons/zipper) with mod assist    Status On-going      OT SHORT TERM GOAL #3   Title Patient will demonstrate ability to cut food on plate with min assist  Status On-going      OT SHORT TERM GOAL #4   Title Patient will demonstrate low reach, grasp, release of lightweight small object (less than 1 lb) with min assist    Status Achieved             OT Long Term Goals - 09/11/19 1239      OT LONG TERM GOAL #1   Title Patient will complete an updated HEP to address LUE functioning due 10/05/19    Status On-going      OT LONG TERM GOAL #2   Title Patient will walk with assistive device into bathroom to use toilet with min assist    Status On-going      OT LONG TERM GOAL #3   Title Patient will transfer onto shower seat with min assist    Status On-going      OT LONG TERM GOAL #4   Title Patient will obtain and release a 2-3 lb object at chest height shelf using LUE and min assist    Status On-going                 Plan - 09/24/19 1418    Clinical  Impression Statement Pt is progressing towards goals. Pt has not tried shower yet.    Body Structure / Function / Physical Skills ADL;Coordination;Endurance;GMC;UE functional use;Sensation;Decreased knowledge of precautions;Balance;Body mechanics;Decreased knowledge of use of DME;Flexibility;IADL;Pain;Vision;Cardiopulmonary status limiting activity;Dexterity;FMC;Proprioception;Strength;Tone;ROM    OT Frequency 2x / week    OT Duration 8 weeks    OT Treatment/Interventions Self-care/ADL training;Electrical Stimulation;Therapeutic exercise;Visual/perceptual remediation/compensation;Patient/family education;Splinting;Neuromuscular education;Moist Heat;Aquatic Therapy;Balance training;Therapeutic activities;Functional Mobility Training;Fluidtherapy;DME and/or AE instruction;Manual Therapy;Cognitive remediation/compensation    Plan , Functional use of LUE, Distal NMR LUE    Consulted and Agree with Plan of Care Patient;Family member/caregiver    Family Member Consulted Wife, [name]           Patient will benefit from skilled therapeutic intervention in order to improve the following deficits and impairments:   Body Structure / Function / Physical Skills: ADL, Coordination, Endurance, GMC, UE functional use, Sensation, Decreased knowledge of precautions, Balance, Body mechanics, Decreased knowledge of use of DME, Flexibility, IADL, Pain, Vision, Cardiopulmonary status limiting activity, Dexterity, FMC, Proprioception, Strength, Tone, ROM       Visit Diagnosis: Muscle weakness (generalized)  Other lack of coordination  Hemiplegia and hemiparesis following cerebral infarction affecting left dominant side (HCC)  Other disturbances of skin sensation    Problem List Patient Active Problem List   Diagnosis Date Noted  . Abnormality of gait 09/24/2019  . AKI (acute kidney injury) ([place])   . Fever   . Sepsis without acute organ dysfunction ([place])   . Elevated BUN   . Blood pressure increase  diastolic   . Labile blood pressure   . Leukopenia   . Benign essential HTN   . Hypoalbuminemia   . Cerebrovascular accident (CVA) of right basal ganglia ([place]) 07/07/2019  . Acute ischemic stroke ([place])   . Dyslipidemia   . Dysphagia, post-stroke   . Polycythemia   . Stroke ([place]) 06/30/2019  . Prediabetes 12/23/2016  . Elevated LFTs 12/23/2016  . Pure hypercholesterolemia 12/21/2016  . Alcoholism ([place]) 12/21/2016  . Eczema 09/18/2013  . Essential hypertension 09/18/2013  . Gastroesophageal reflux disease without esophagitis 09/18/2013  . Tobacco abuse 09/18/2013    Zain Bingman 09/24/2019, 2:20 PM  [facility] [address] [place], [name], [address] Phone: [phone]   Fax:  [phone]  Name: Nyzaiah Kai MRN: [id number] Date of Birth: April 11, 1950

## 2019-09-25 ENCOUNTER — Ambulatory Visit: Payer: BC Managed Care – PPO

## 2019-09-25 DIAGNOSIS — R2689 Other abnormalities of gait and mobility: Secondary | ICD-10-CM

## 2019-09-25 DIAGNOSIS — M6281 Muscle weakness (generalized): Secondary | ICD-10-CM

## 2019-09-25 DIAGNOSIS — R2681 Unsteadiness on feet: Secondary | ICD-10-CM

## 2019-09-25 DIAGNOSIS — I69352 Hemiplegia and hemiparesis following cerebral infarction affecting left dominant side: Secondary | ICD-10-CM

## 2019-09-25 NOTE — Therapy (Signed)
[facility] [address] [place], [name], [address] Phone: [phone]   Fax:  [phone]  Physical Therapy Treatment  Patient Details  Name: Colen Eltzroth MRN: [id number] Date of Birth: [birth year] Referring Provider (PT): Lauraine Rinne, PA-C   Encounter Date: 09/25/2019   PT End of Session - 09/25/19 0853    Visit Number 13    Number of Visits 17    Date for PT Re-Evaluation 11/04/19    Authorization Type BCBS (follow medicare guidelines, 10th visit PN)    Progress Note Due on Visit 10    PT Start Time 0847    PT Stop Time 0930    PT Time Calculation (min) 43 min    Equipment Utilized During Treatment Gait belt    Activity Tolerance Patient tolerated treatment well    Behavior During Therapy [facility] for tasks assessed/performed           Past Medical History:  Diagnosis Date  . Alcohol abuse    drinks 1 gallon of Gin every weekend, nothing during the week x >15 yrs  . Elevated transaminase level    +elev bili: abd u/s 06/2014 showed stable small hepatic hemangiomas, o/w normal.  . Essential hypertension   . GERD (gastroesophageal reflux disease)   . Hyperlipidemia 03/2014   Atorv started-chol improved  . Impaired fasting glucose 03/2014  . Nephrolithiasis   . PUD (peptic ulcer disease)   . Stroke ([place])   . Tobacco dependence    chantix: "psych effects"    Past Surgical History:  Procedure Laterality Date  . COLONOSCOPY  2005   Recall 10 yrs ([place])  . LOOP RECORDER INSERTION N/A 07/03/2019   Procedure: LOOP RECORDER INSERTION;  Surgeon: Thompson Grayer, MD;  Location: [facility];  Service: Cardiovascular;  Laterality: N/A;  . removal of kidney stones  2006   cystoscopic--removed from ureter.  No prob since.    There were no vitals filed for this visit.   Subjective Assessment - 09/25/19 0852    Subjective Patient reports been walking in the home, going well. No pain today.    Patient  is accompained by: Family member    Pertinent History PMH: HTN, alcohol abuse (ongoing use), HLD, GERD, PUD, tobacco use    Limitations Standing;Walking;House hold activities    Patient Stated Goals return to prior level of function, walk, "wheelchair is not apart of me" (wants to get out of the w/c)    Currently in Pain? No/denies                             [facility] Adult PT Treatment/Exercise - 09/25/19 0001      Transfers   Transfers Sit to Stand;Stand to Sit    Sit to Stand 5: Supervision;4: Min assist    Sit to Stand Details Manual facilitation for weight shifting    Sit to Stand Details (indicate cue type and reason) completed with RW placed in front, however patinet able to stand and maintain balance without UE support from RW.     Stand to Sit 5: Supervision;4: Min assist    Comments completed sit <> stands from mat with 2" step under RLE to promote improved weight shift to L. Completed 1 x 15 reps. PT providing manual faciliation for improved weight shift to LLE.  Mirror placed in front for visual feedback, upon standing PT providing verbal/tactile cues for improved upright posture  Ambulation/Gait   Ambulation/Gait Yes    Ambulation/Gait Assistance 4: Min guard;4: Min assist    Ambulation/Gait Assistance Details Completed gait training with RW and PT providing min guard and occasional min A for LLE for improved step length. Pt still demonstrate most difficulty with turns at this time and requiring verbal cueing from PT for proper negotiation.     Ambulation Distance (Feet) 125 Feet   x1, 129ft x 1, 56ft x 1    Assistive device Rolling walker    Gait Pattern Step-through pattern;Step-to pattern;Decreased stride length;Decreased stance time - right;Decreased step length - left    Ambulation Surface Level;Indoor    Stairs Yes    Stairs Assistance 4: Min guard;4: Min assist    Stairs Assistance Details (indicate cue type and reason) PT continue to provide verbal  cueing for sequencing. Patient stepping up with RLE, down with LLE. Pt demo heavy reliance on RW today, and due to fatigue requiring intermittent min A for LLE for placement.     Stair Management Technique With walker    Number of Stairs 1   4 inch step   Height of Stairs 4                  PT Education - 09/25/19 0940    Education Details Educated to ensure walking with RW within the home for improved safety.    Person(s) Educated Spouse;Patient    Methods Explanation    Comprehension Verbalized understanding            PT Short Term Goals - 09/03/19 1151      PT SHORT TERM GOAL #1   Title Patient will be independent with Initial HEP, with wifes assistance    Baseline Patient reports independence with HEP with wifes assistance at this time, reports completing exercises everyday.    Time 4    Period Weeks    Status Achieved    Target Date 09/03/19      PT SHORT TERM GOAL #2   Title Patient will demonstrate ability to complete stand static w/ LRAD and midline alignment x 4 minutes w/o LOB to demonstrate improved tolerance for ADL tasks    Baseline able to stand for 3 minutes 53 secs with RW and proper alignment w/o LOB    Time 4    Period Weeks    Status On-going    Target Date 09/03/19      PT SHORT TERM GOAL #3   Title Patient will demo ability to complete stand pivot transfer w/ LRAD and Min A for improved functional mobility    Baseline completed with RW and min A    Time 4    Period Weeks    Status Achieved    Target Date 09/03/19      PT SHORT TERM GOAL #4   Title Patient will demo ability to ambulate 25 feet w/ LRAD and Min A for improved mobility    Baseline ambulate 35 ft w/ RW and Min A    Time 4    Period Weeks    Status Achieved    Target Date 09/03/19             PT Long Term Goals - 08/06/19 2057      PT LONG TERM GOAL #1   Title Patient will be independent with Final HEP, with wifes assistance    Time 8    Period Weeks    Status New     Target Date  10/01/19      PT LONG TERM GOAL #2   Title Patient will demonstrate ability to complete transfer from w/c <> mat w/ CGA to demo improved independence with transfers.    Baseline Mod A    Time 8    Period Weeks    Status New    Target Date 10/01/19      PT LONG TERM GOAL #3   Title Patient will demonstrate ability to ambulate 50 ft w/ LRAD and min A to demonstrate improved household mobility    Time 8    Period Weeks    Status New    Target Date 10/01/19      PT LONG TERM GOAL #4   Title Patient will demonstrate ability to complete sit <> stand Mod I to demonstrate improved functional mobility    Time 8    Period Weeks    Status New    Target Date 10/01/19                 Plan - 09/25/19 0935    Clinical Impression Statement Continued gait training today with RW. Patient able to ambulate 125 ft x 2 today, with intermittent Min A for the LLE for improved step length. Continued NMR focused on improved weight shift to LLE and improve weight acceptance on the LLE with patient tolerating well. PT providing education to ensure walking with RW in the hope for improved safety.    Personal Factors and Comorbidities Comorbidity 3+    Comorbidities HTN, alcohol abuse (ongoing use), HLD, GERD, PUD, tobacco use    Examination-Activity Limitations Bed Mobility;Dressing;Locomotion Level;Stairs;Stand;Toileting;Transfers;Bathing    Examination-Participation Restrictions Driving;Interpersonal Relationship;Yard Work    [employer] Evolving/Moderate complexity    Rehab Potential Good    PT Frequency 2x / week    PT Duration 8 weeks    PT Treatment/Interventions ADLs/Self Care Home Management;Electrical Stimulation;DME Instruction;Moist Heat;Cryotherapy;Gait training;Stair training;Functional mobility training;Therapeutic activities;Therapeutic exercise;Balance training;Neuromuscular re-education;Patient/family education;Orthotic Fit/Training;Wheelchair  mobility training;Passive range of motion    PT Next Visit Plan continue gait with RW, NMR for improved R lateral weight shift. transfers, working on AD management with turns, weight shifting activities, activities in tall kneeling.           Patient will benefit from skilled therapeutic intervention in order to improve the following deficits and impairments:  Abnormal gait, Decreased coordination, Difficulty walking, Impaired tone, Decreased safety awareness, Decreased endurance, Decreased activity tolerance, Pain, Decreased balance, Decreased knowledge of use of DME, Postural dysfunction, Impaired sensation, Decreased strength, Decreased mobility  Visit Diagnosis: Muscle weakness (generalized)  Hemiplegia and hemiparesis following cerebral infarction affecting left dominant side (HCC)  Other abnormalities of gait and mobility  Unsteadiness on feet     Problem List Patient Active Problem List   Diagnosis Date Noted  . Abnormality of gait 09/24/2019  . AKI (acute kidney injury) ([place])   . Fever   . Sepsis without acute organ dysfunction ([place])   . Elevated BUN   . Blood pressure increase diastolic   . Labile blood pressure   . Leukopenia   . Benign essential HTN   . Hypoalbuminemia   . Cerebrovascular accident (CVA) of right basal ganglia ([place]) 07/07/2019  . Acute ischemic stroke ([place])   . Dyslipidemia   . Dysphagia, post-stroke   . Polycythemia   . Stroke ([place]) 06/30/2019  . Prediabetes 12/23/2016  . Elevated LFTs 12/23/2016  . Pure hypercholesterolemia 12/21/2016  . Alcoholism ([place]) 12/21/2016  . Eczema 09/18/2013  .  Essential hypertension 09/18/2013  . Gastroesophageal reflux disease without esophagitis 09/18/2013  . Tobacco abuse 09/18/2013    Jones Bales, PT, DPT 09/25/2019, 9:43 AM  [facility] [address] [place], [name], [address] Phone: [phone]   Fax:  [phone]  Name: Dublin Grayer MRN: [id number] Date of Birth: 08-13-1950

## 2019-09-25 NOTE — Therapy (Signed)
[facility] [address] [place], [name], [address] Phone: [phone]   Fax:  [phone]  Speech Language Pathology Treatment and Progress Note  Patient Details  Name: Russell Russell MRN: [id number] Date of Birth: Aug 03, 1950 Referring Provider (SLP): Stann Mainland, [place]   Encounter Date: 09/24/2019   End of Session - 09/25/19 0841    Visit Number 10    Number of Visits 17    Date for SLP Re-Evaluation 11/05/19    SLP Start Time 1150    SLP Stop Time  1230    SLP Time Calculation (min) 40 min    Activity Tolerance Patient tolerated treatment well           Past Medical History:  Diagnosis Date  . Alcohol abuse    drinks 1 gallon of Gin every weekend, nothing during the week x >15 yrs  . Elevated transaminase level    +elev bili: abd u/s 06/2014 showed stable small hepatic hemangiomas, o/w normal.  . Essential hypertension   . GERD (gastroesophageal reflux disease)   . Hyperlipidemia 03/2014   Atorv started-chol improved  . Impaired fasting glucose 03/2014  . Nephrolithiasis   . PUD (peptic ulcer disease)   . Stroke ([place])   . Tobacco dependence    chantix: "psych effects"    Past Surgical History:  Procedure Laterality Date  . COLONOSCOPY  2005   Recall 10 yrs ([place])  . LOOP RECORDER INSERTION N/A 07/03/2019   Procedure: LOOP RECORDER INSERTION;  Surgeon: Thompson Grayer, MD;  Location: [facility];  Service: Cardiovascular;  Laterality: N/A;  . removal of kidney stones  2006   cystoscopic--removed from ureter.  No prob since.    There were no vitals filed for this visit.   Subjective Assessment - 09/24/19 1108    Subjective "Nothing really."    Currently in Pain? Yes                 ADULT SLP TREATMENT - 09/25/19 0837      General Information   Behavior/Cognition Alert;Cooperative;Pleasant mood      Treatment Provided   Treatment provided Cognitive-Linquistic      Pain  Assessment   Pain Assessment No/denies pain      Cognitive-Linquistic Treatment   Treatment focused on Dysarthria    Skilled Treatment Patient did not bring binder today. Focused session on dysarthria. Pt used EMST device for 5x5 reps at 19cm with appropriate sound ~80% of the time; required usual mod cues for awareness of when rep was not correct. Used tactile cue of hand on abdomen with simple naming tasks; rare min A for louder speech. When SLP increased cogitive load with object descriptions and single word and phrase responses pt required usual mod A for louder speech.      Assessment / Recommendations / Plan   Plan Continue with current plan of care      Progression Toward Goals   Progression toward goals Progressing toward goals              SLP Short Term Goals - 09/24/19 1112      SLP SHORT TERM GOAL #1   Title pt will demo the knowledge that he can use a memory system for daily events, ID'ing prospective and past events, medication management, and appoitmetn tracking, etc x 3 sessions    Period --   or 9 total visits, for all STGs   Status Not Met  SLP SHORT TERM GOAL #2   Title pt will demo selective atteniton for 5 mintues to perform simple cognitive lingiustic tasks in a min-mod noisy environment x2 sessions    Status Partially Met      SLP SHORT TERM GOAL #3   Title pt will tell SLP 2 congitive deficits affecting his life at home post-CVA in two sessions    Status Deferred      SLP SHORT TERM GOAL #4   Title pt will incr expiratory muscle strength taken in first 2 ST sessions via spirometer    Baseline MEP 45 on 08/13/19    Time 1    Status Partially Met            SLP Long Term Goals - 09/24/19 1112      SLP LONG TERM GOAL #1   Title pt will demo simple conversation of 5 mintues with average mid 60s dB x 3 sessions    Time 3    Period Weeks   or 17 sessions   Status On-going      SLP LONG TERM GOAL #2   Title pt will demo selective atteniton for 7  mintues to perform simple cognitive lingiustic tasks in a min noisy environment x2 sessions    Time 3    Period Weeks    Status Revised      SLP LONG TERM GOAL #3   Title pt will demo WFL alternating attention in simple cognitve linguistic tasks with occasional min A in 3 sessions    Status Deferred      SLP LONG TERM GOAL #4   Title pt will independnently access memory notebook/planner for appoitnment management, to-do-lists, recall of prospective and past events, etc in 3 sessions    Time 4    Period Weeks    Status On-going            Plan - 09/25/19 0841    Clinical Impression Statement Pt presents today with  cognitive communication deficits which contiue to affect his ability to be independent and at their current level will require consistent assistance from wife/family for most needs. Pt req'd SLP mod assistance to access memory book to find appropriate sections.. Pt also presents with reduced voice volume (low 60s dB most of the time), affecting intelligibility with wife at home. SLP recommends skilled ST to address cognitive linguistics and dysarthria c/b reduced voice volume.    Speech Therapy Frequency 2x / week    Duration --   8 weeks or 17 total visits   Treatment/Interventions Environmental controls;Other (comment);Compensatory techniques;Functional tasks;SLP instruction and feedback;Cueing hierarchy;Cognitive reorganization;Patient/family education;Internal/external aids   EMST   Potential to Achieve Goals Fair    Potential Considerations Severity of impairments;Ability to learn/carryover information    Consulted and Agree with Plan of Care Patient;Family member/caregiver    Family Member Consulted wife           Patient will benefit from skilled therapeutic intervention in order to improve the following deficits and impairments:   Dysarthria and anarthria  Cognitive communication deficit    Problem List Patient Active Problem List   Diagnosis Date Noted  .  Abnormality of gait 09/24/2019  . AKI (acute kidney injury) ([place])   . Fever   . Sepsis without acute organ dysfunction ([place])   . Elevated BUN   . Blood pressure increase diastolic   . Labile blood pressure   . Leukopenia   . Benign essential HTN   . Hypoalbuminemia   .  Cerebrovascular accident (CVA) of right basal ganglia ([place]) 07/07/2019  . Acute ischemic stroke ([place])   . Dyslipidemia   . Dysphagia, post-stroke   . Polycythemia   . Stroke ([place]) 06/30/2019  . Prediabetes 12/23/2016  . Elevated LFTs 12/23/2016  . Pure hypercholesterolemia 12/21/2016  . Alcoholism ([place]) 12/21/2016  . Eczema 09/18/2013  . Essential hypertension 09/18/2013  . Gastroesophageal reflux disease without esophagitis 09/18/2013  . Tobacco abuse 09/18/2013   Speech Therapy Progress Note  Dates of Reporting Period: 08/07/19 to 09/24/19  Patient has been seen for 10 speech therapy sessions targeting dyarthria and cognitive communication deficits. See goals, clinical impressions above for details. Skilled ST recommended to increase pt's intelligibility and promote independence.    Deneise Lever, [place], [place] 09/25/2019, 8:42 AM  [facility] [address] [place], [name], [address] Phone: [phone]   Fax:  [phone]   Name: Aldean Pipe MRN: [id number] Date of Birth: December 23, 1950

## 2019-09-28 ENCOUNTER — Ambulatory Visit: Payer: BC Managed Care – PPO | Admitting: Occupational Therapy

## 2019-09-28 ENCOUNTER — Ambulatory Visit: Payer: BC Managed Care – PPO

## 2019-09-28 ENCOUNTER — Other Ambulatory Visit: Payer: Self-pay

## 2019-09-28 DIAGNOSIS — R471 Dysarthria and anarthria: Secondary | ICD-10-CM

## 2019-09-28 DIAGNOSIS — M6281 Muscle weakness (generalized): Secondary | ICD-10-CM

## 2019-09-28 DIAGNOSIS — R278 Other lack of coordination: Secondary | ICD-10-CM

## 2019-09-28 DIAGNOSIS — R41841 Cognitive communication deficit: Secondary | ICD-10-CM

## 2019-09-28 DIAGNOSIS — I69352 Hemiplegia and hemiparesis following cerebral infarction affecting left dominant side: Secondary | ICD-10-CM

## 2019-09-28 DIAGNOSIS — R2689 Other abnormalities of gait and mobility: Secondary | ICD-10-CM

## 2019-09-28 DIAGNOSIS — R2681 Unsteadiness on feet: Secondary | ICD-10-CM

## 2019-09-28 NOTE — Therapy (Signed)
[facility] [address] [place], [name], [address] Phone: [phone]   Fax:  [phone]  Speech Language Pathology Treatment  Patient Details  Name: Russell Russell MRN: [id number] Date of Birth: Jan 03, 1951 Referring Provider (SLP): Stann Mainland, [place]   Encounter Date: 09/28/2019   End of Session - 09/28/19 1657    Visit Number 11    Number of Visits 17    Date for SLP Re-Evaluation 11/05/19    SLP Start Time 46    SLP Stop Time  1100    SLP Time Calculation (min) 40 min    Activity Tolerance Patient tolerated treatment well           Past Medical History:  Diagnosis Date  . Alcohol abuse    drinks 1 gallon of Gin every weekend, nothing during the week x >15 yrs  . Elevated transaminase level    +elev bili: abd u/s 06/2014 showed stable small hepatic hemangiomas, o/w normal.  . Essential hypertension   . GERD (gastroesophageal reflux disease)   . Hyperlipidemia 03/2014   Atorv started-chol improved  . Impaired fasting glucose 03/2014  . Nephrolithiasis   . PUD (peptic ulcer disease)   . Stroke ([place])   . Tobacco dependence    chantix: "psych effects"    Past Surgical History:  Procedure Laterality Date  . COLONOSCOPY  2005   Recall 10 yrs ([place])  . LOOP RECORDER INSERTION N/A 07/03/2019   Procedure: LOOP RECORDER INSERTION;  Surgeon: Thompson Grayer, MD;  Location: [facility];  Service: Cardiovascular;  Laterality: N/A;  . removal of kidney stones  2006   cystoscopic--removed from ureter.  No prob since.    There were no vitals filed for this visit.   Subjective Assessment - 09/28/19 1031    Subjective "What was it? PT?"                 ADULT SLP TREATMENT - 09/28/19 1048      General Information   Behavior/Cognition Alert;Cooperative;Pleasant mood      Treatment Provided   Treatment provided Cognitive-Linquistic      Cognitive-Linquistic Treatment   Treatment  focused on Dysarthria;Cognition    Skilled Treatment Pt brought binder today. SLP engaged pt with tasks re: binder dividers. Pt was oriented to sections in the binder without cues. SLP then engaged pt in simple orientation tasks using his therapy schedule, inside his binder. SLP reqw'd to use mod-max cues with pt for memory for therapist name Jerene Pitch" instead of Kaitlyn), and for today's day of week and date. (speech tx - individual) Pt req'd occasional min cues initially for strong blow, faded to rare min A for EMST reps. Pt independently counted reps corectly.      Assessment / Recommendations / Plan   Plan Continue with current plan of care      Progression Toward Goals   Progression toward goals Progressing toward goals              SLP Short Term Goals - 09/24/19 1112      SLP SHORT TERM GOAL #1   Title pt will demo the knowledge that he can use a memory system for daily events, ID'ing prospective and past events, medication management, and appoitmetn tracking, etc x 3 sessions    Period --   or 9 total visits, for all STGs   Status Not Met      SLP SHORT TERM GOAL #2   Title pt  will demo selective atteniton for 5 mintues to perform simple cognitive lingiustic tasks in a min-mod noisy environment x2 sessions    Status Partially Met      SLP SHORT TERM GOAL #3   Title pt will tell SLP 2 congitive deficits affecting his life at home post-CVA in two sessions    Status Deferred      SLP SHORT TERM GOAL #4   Title pt will incr expiratory muscle strength taken in first 2 ST sessions via spirometer    Baseline MEP 45 on 08/13/19    Time 1    Status Partially Met            SLP Long Term Goals - 09/28/19 1658      SLP LONG TERM GOAL #1   Title pt will demo simple conversation of 5 mintues with average mid 60s dB x 3 sessions    Time 3    Period Weeks   or 17 sessions   Status On-going      SLP LONG TERM GOAL #2   Title pt will demo selective atteniton for 7 mintues to  perform simple cognitive lingiustic tasks in a min noisy environment x2 sessions    Time 2    Period Weeks    Status On-going      SLP LONG TERM GOAL #3   Title pt will demo WFL alternating attention in simple cognitve linguistic tasks with occasional min A in 3 sessions    Status Deferred      SLP LONG TERM GOAL #4   Title pt will independnently access memory notebook/planner for appoitnment management, to-do-lists, recall of prospective and past events, etc in 3 sessions    Time 3    Period Weeks    Status On-going            Plan - 09/28/19 1658    Clinical Impression Statement Pt presents today with  cognitive communication deficits which contiue to affect his ability to be independent and at their current level will require consistent assistance from wife/family for most needs. Pt req'd SLP mod assistance to access memory book to find appropriate sections.. Pt also presents with reduced voice volume (low 60s dB most of the time), affecting intelligibility with wife at home. SLP recommends skilled ST to address cognitive linguistics and dysarthria c/b reduced voice volume.    Speech Therapy Frequency 2x / week    Duration --   8 weeks or 17 total visits   Treatment/Interventions Environmental controls;Other (comment);Compensatory techniques;Functional tasks;SLP instruction and feedback;Cueing hierarchy;Cognitive reorganization;Patient/family education;Internal/external aids   EMST   Potential to Achieve Goals Fair    Potential Considerations Severity of impairments;Ability to learn/carryover information    Consulted and Agree with Plan of Care Patient;Family member/caregiver    Family Member Consulted wife           Patient will benefit from skilled therapeutic intervention in order to improve the following deficits and impairments:   Dysarthria and anarthria  Cognitive communication deficit    Problem List Patient Active Problem List   Diagnosis Date Noted  .  Abnormality of gait 09/24/2019  . AKI (acute kidney injury) ([place])   . Fever   . Sepsis without acute organ dysfunction ([place])   . Elevated BUN   . Blood pressure increase diastolic   . Labile blood pressure   . Leukopenia   . Benign essential HTN   . Hypoalbuminemia   . Cerebrovascular accident (CVA) of right basal ganglia (  [place]) 07/07/2019  . Acute ischemic stroke ([place])   . Dyslipidemia   . Dysphagia, post-stroke   . Polycythemia   . Stroke ([place]) 06/30/2019  . Prediabetes 12/23/2016  . Elevated LFTs 12/23/2016  . Pure hypercholesterolemia 12/21/2016  . Alcoholism ([place]) 12/21/2016  . Eczema 09/18/2013  . Essential hypertension 09/18/2013  . Gastroesophageal reflux disease without esophagitis 09/18/2013  . Tobacco abuse 09/18/2013    [facility] ,[place], [place]  09/28/2019, 5:01 PM  [facility] [address] [place], [name], [address] Phone: [phone]   Fax:  [phone]   Name: Collier Bohnet MRN: [id number] Date of Birth: May 19, 1950

## 2019-09-28 NOTE — Therapy (Signed)
[facility] [address] [place], [name], [address] Phone: [phone]   Fax:  [phone]  Physical Therapy Treatment  Patient Details  Name: Russell Russell MRN: [id number] Date of Birth: February 05, 1951 Referring Provider (PT): Lauraine Rinne, PA-C   Encounter Date: 09/28/2019   PT End of Session - 09/28/19 1105    Visit Number 14    Number of Visits 17    Date for PT Re-Evaluation 11/04/19    Authorization Type BCBS (follow medicare guidelines, 10th visit PN)    Progress Note Due on Visit 10    PT Start Time 1102    PT Stop Time 1144    PT Time Calculation (min) 42 min    Equipment Utilized During Treatment Gait belt    Activity Tolerance Patient tolerated treatment well    Behavior During Therapy WFL for tasks assessed/performed           Past Medical History:  Diagnosis Date   Alcohol abuse    drinks 1 gallon of Gin every weekend, nothing during the week x >15 yrs   Elevated transaminase level    +elev bili: abd u/s 06/2014 showed stable small hepatic hemangiomas, o/w normal.   Essential hypertension    GERD (gastroesophageal reflux disease)    Hyperlipidemia 03/2014   Atorv started-chol improved   Impaired fasting glucose 03/2014   Nephrolithiasis    PUD (peptic ulcer disease)    Stroke ([place])    Tobacco dependence    chantix: "psych effects"    Past Surgical History:  Procedure Laterality Date   COLONOSCOPY  2005   Recall 10 yrs ([place])   LOOP RECORDER INSERTION N/A 07/03/2019   Procedure: LOOP RECORDER INSERTION;  Surgeon: Thompson Grayer, MD;  Location: [facility];  Service: Cardiovascular;  Laterality: N/A;   removal of kidney stones  2006   cystoscopic--removed from ureter.  No prob since.    There were no vitals filed for this visit.   Subjective Assessment - 09/28/19 1104    Subjective Patient reports no pain. No falls. No new issues/complaints.    Patient is  accompained by: Family member    Pertinent History PMH: HTN, alcohol abuse (ongoing use), HLD, GERD, PUD, tobacco use    Limitations Standing;Walking;House hold activities    Patient Stated Goals return to prior level of function, walk, "wheelchair is not apart of me" (wants to get out of the w/c)    Currently in Pain? No/denies                             [facility] Adult PT Treatment/Exercise - 09/28/19 0001      Transfers   Transfers Sit to Stand;Stand to Sit    Sit to Stand 5: Supervision    Stand to Sit 5: Supervision    Comments completed x 5 reps from w/c with RW      Ambulation/Gait   Ambulation/Gait Yes    Ambulation/Gait Assistance 4: Min guard;4: Min assist    Ambulation/Gait Assistance Details completed gait training with RW. Patient able to progress ambulation distance, requiring Min A from PT for LLE at times during end of ambulation.     Ambulation Distance (Feet) 160 Feet   x 1, 266ft x 1    Assistive device Rolling walker    Gait Pattern Step-through pattern;Step-to pattern;Decreased stride length;Decreased stance time - right;Decreased step length - left    Ambulation Surface  Level;Indoor      Neuro Re-ed    Neuro Re-ed Details  Standing at counter with single UE support, completed forward marching with focused on improved hip/knee flexion with LLE. PT Providing verbal uces for improved LLE weight shift and stance with RLE marching. Completed x 2 laps, down and back with Min A from PT. Completed side stepping x 2 laps, down and back. Verbal cues to keep hip/toes pointed forwad with completion to allow for proper musculature engagement.       Exercises   Exercises Knee/Hip      Knee/Hip Exercises: Aerobic   Nustep Completed NuStep with BUE/BLE's on Level 4 x 6 minutes with focused on improved BLE strengthening and endurance training.                     PT Short Term Goals - 09/03/19 1151      PT SHORT TERM GOAL #1   Title Patient will be  independent with Initial HEP, with wifes assistance    Baseline Patient reports independence with HEP with wifes assistance at this time, reports completing exercises everyday.    Time 4    Period Weeks    Status Achieved    Target Date 09/03/19      PT SHORT TERM GOAL #2   Title Patient will demonstrate ability to complete stand static w/ LRAD and midline alignment x 4 minutes w/o LOB to demonstrate improved tolerance for ADL tasks    Baseline able to stand for 3 minutes 53 secs with RW and proper alignment w/o LOB    Time 4    Period Weeks    Status On-going    Target Date 09/03/19      PT SHORT TERM GOAL #3   Title Patient will demo ability to complete stand pivot transfer w/ LRAD and Min A for improved functional mobility    Baseline completed with RW and min A    Time 4    Period Weeks    Status Achieved    Target Date 09/03/19      PT SHORT TERM GOAL #4   Title Patient will demo ability to ambulate 25 feet w/ LRAD and Min A for improved mobility    Baseline ambulate 35 ft w/ RW and Min A    Time 4    Period Weeks    Status Achieved    Target Date 09/03/19             PT Long Term Goals - 08/06/19 2057      PT LONG TERM GOAL #1   Title Patient will be independent with Final HEP, with wifes assistance    Time 8    Period Weeks    Status New    Target Date 10/01/19      PT LONG TERM GOAL #2   Title Patient will demonstrate ability to complete transfer from w/c <> mat w/ CGA to demo improved independence with transfers.    Baseline Mod A    Time 8    Period Weeks    Status New    Target Date 10/01/19      PT LONG TERM GOAL #3   Title Patient will demonstrate ability to ambulate 50 ft w/ LRAD and min A to demonstrate improved household mobility    Time 8    Period Weeks    Status New    Target Date 10/01/19      PT LONG TERM GOAL #  4   Title Patient will demonstrate ability to complete sit <> stand Mod I to demonstrate improved functional mobility    Time  8    Period Weeks    Status New    Target Date 10/01/19                 Plan - 09/28/19 1140    Clinical Impression Statement Today's skilled PT session focused on progression of activity tolerance and mobility with improving ambulation distances to 160-200 ft prior to require rest break. During end of ambulation, patient does require Min A for LLE at times. PRogressed standing NMR as tolerated by patient for improved weight shift and activation. Patient will continue to benefit from skilled PT services to progress toward all goals.    Personal Factors and Comorbidities Comorbidity 3+    Comorbidities HTN, alcohol abuse (ongoing use), HLD, GERD, PUD, tobacco use    Examination-Activity Limitations Bed Mobility;Dressing;Locomotion Level;Stairs;Stand;Toileting;Transfers;Bathing    Examination-Participation Restrictions Driving;Interpersonal Relationship;Yard Work    [employer] Evolving/Moderate complexity    Rehab Potential Good    PT Frequency 2x / week    PT Duration 8 weeks    PT Treatment/Interventions ADLs/Self Care Home Management;Electrical Stimulation;DME Instruction;Moist Heat;Cryotherapy;Gait training;Stair training;Functional mobility training;Therapeutic activities;Therapeutic exercise;Balance training;Neuromuscular re-education;Patient/family education;Orthotic Fit/Training;Wheelchair mobility training;Passive range of motion    PT Next Visit Plan continue gait with RW, NMR for improved R lateral weight shift. transfers, working on AD management with turns, weight shifting activities, activities in tall kneeling.           Patient will benefit from skilled therapeutic intervention in order to improve the following deficits and impairments:  Abnormal gait, Decreased coordination, Difficulty walking, Impaired tone, Decreased safety awareness, Decreased endurance, Decreased activity tolerance, Pain, Decreased balance, Decreased knowledge of use of DME,  Postural dysfunction, Impaired sensation, Decreased strength, Decreased mobility  Visit Diagnosis: Muscle weakness (generalized)  Hemiplegia and hemiparesis following cerebral infarction affecting left dominant side (HCC)  Other abnormalities of gait and mobility  Unsteadiness on feet     Problem List Patient Active Problem List   Diagnosis Date Noted   Abnormality of gait 09/24/2019   AKI (acute kidney injury) ([place])    Fever    Sepsis without acute organ dysfunction (HCC)    Elevated BUN    Blood pressure increase diastolic    Labile blood pressure    Leukopenia    Benign essential HTN    Hypoalbuminemia    Cerebrovascular accident (CVA) of right basal ganglia ([place]) 07/07/2019   Acute ischemic stroke ([place])    Dyslipidemia    Dysphagia, post-stroke    Polycythemia    Stroke ([place]) 06/30/2019   Prediabetes 12/23/2016   Elevated LFTs 12/23/2016   Pure hypercholesterolemia 12/21/2016   Alcoholism ([place]) 12/21/2016   Eczema 09/18/2013   Essential hypertension 09/18/2013   Gastroesophageal reflux disease without esophagitis 09/18/2013   Tobacco abuse 09/18/2013    Jones Bales, PT, DPT 09/28/2019, 11:49 AM  [facility] [address] [place], [name], [address] Phone: [phone]   Fax:  [phone]  Name: Russell Russell MRN: [id number] Date of Birth: [birth year]

## 2019-09-28 NOTE — Therapy (Signed)
[facility] [address] [place], [name], [address] Phone: [phone]   Fax:  [phone]  Occupational Therapy Treatment  Patient Details  Name: Russell Russell MRN: [id number] Date of Birth: [birth year] Referring Provider (OT): [name]   Encounter Date: 09/28/2019   OT End of Session - 09/28/19 1007    Visit Number 7    Number of Visits 17    Authorization Type BCBS? "Follow Medicare Guidelines"    Authorization - Number of Visits 7    Progress Note Due on Visit 10    OT Start Time 0930    OT Stop Time 1015    OT Time Calculation (min) 45 min    Activity Tolerance Patient tolerated treatment well    Behavior During Therapy [facility] for tasks assessed/performed           Past Medical History:  Diagnosis Date  . Alcohol abuse    drinks 1 gallon of Gin every weekend, nothing during the week x >15 yrs  . Elevated transaminase level    +elev bili: abd u/s 06/2014 showed stable small hepatic hemangiomas, o/w normal.  . Essential hypertension   . GERD (gastroesophageal reflux disease)   . Hyperlipidemia 03/2014   Atorv started-chol improved  . Impaired fasting glucose 03/2014  . Nephrolithiasis   . PUD (peptic ulcer disease)   . Stroke ([place])   . Tobacco dependence    chantix: "psych effects"    Past Surgical History:  Procedure Laterality Date  . COLONOSCOPY  2005   Recall 10 yrs ([place])  . LOOP RECORDER INSERTION N/A 07/03/2019   Procedure: LOOP RECORDER INSERTION;  Surgeon: Thompson Grayer, MD;  Location: [facility];  Service: Cardiovascular;  Laterality: N/A;  . removal of kidney stones  2006   cystoscopic--removed from ureter.  No prob since.    There were no vitals filed for this visit.   Subjective Assessment - 09/28/19 0935    Patient is accompanied by: Family member    Pertinent History HTN, ETOH use, smoker, loop recorder    Currently in Pain? No/denies          Pt/wife reports  he is cutting food at home. Pt practiced donning button up shirt in standing w/ min assist to reach behind back and place RUE in sleeve for balance. Pt then sat and practiced hooking buttons with extra time and min assist x 1. Pt required mirror for higher buttons.  Pt standing to place rubber washers on various height prongs with LUE - pt required rest break x 2 and cues to fully reach for target, min assist for distal control occasionally. Pt fatigued easily LUE UBE x 8 min, level 3 for UB endurance and reciprocal movement pattern - pt required 2 rest breaks.                         OT Short Term Goals - 09/28/19 1008      OT SHORT TERM GOAL #1   Title Patient will compelte a home exercise program designed to improve functional use of LUE with mod cueing and set up due 09/05/19    Status Achieved      OT SHORT TERM GOAL #2   Title Patient will don a front opening shirt with fasteners (buttons/zipper) with mod assist    Status Achieved   Pt demo in clinic with extra time and occasional min assist     OT  SHORT TERM GOAL #3   Title Patient will demonstrate ability to cut food on plate with min assist    Status Achieved   per pt report     OT SHORT TERM GOAL #4   Title Patient will demonstrate low reach, grasp, release of lightweight small object (less than 1 lb) with min assist    Status Achieved             OT Long Term Goals - 09/11/19 1239      OT LONG TERM GOAL #1   Title Patient will complete an updated HEP to address LUE functioning due 10/05/19    Status On-going      OT LONG TERM GOAL #2   Title Patient will walk with assistive device into bathroom to use toilet with min assist    Status On-going      OT LONG TERM GOAL #3   Title Patient will transfer onto shower seat with min assist    Status On-going      OT LONG TERM GOAL #4   Title Patient will obtain and release a 2-3 lb object at chest height shelf using LUE and min assist    Status On-going                  Plan - 09/28/19 1009    Clinical Impression Statement Pt making progress towards goals - pt has met STG #2 AND #3.    Body Structure / Function / Physical Skills ADL;Coordination;Endurance;GMC;UE functional use;Sensation;Decreased knowledge of precautions;Balance;Body mechanics;Decreased knowledge of use of DME;Flexibility;IADL;Pain;Vision;Cardiopulmonary status limiting activity;Dexterity;FMC;Proprioception;Strength;Tone;ROM    Cognitive Skills Attention;Orientation;Thought;Perception;Problem Solve;Safety Awareness;Sequencing;Memory;Learn    Psychosocial Skills Habits;Routines and Behaviors    Rehab Potential Good    OT Frequency 2x / week    OT Duration 8 weeks    OT Treatment/Interventions Self-care/ADL training;Electrical Stimulation;Therapeutic exercise;Visual/perceptual remediation/compensation;Patient/family education;Splinting;Neuromuscular education;Moist Heat;Aquatic Therapy;Balance training;Therapeutic activities;Functional Mobility Training;Fluidtherapy;DME and/or AE instruction;Manual Therapy;Cognitive remediation/compensation    Plan , Functional use of LUE, Distal NMR LUE    Consulted and Agree with Plan of Care Patient;Family member/caregiver    Family Member Consulted Wife, [name]           Patient will benefit from skilled therapeutic intervention in order to improve the following deficits and impairments:   Body Structure / Function / Physical Skills: ADL, Coordination, Endurance, GMC, UE functional use, Sensation, Decreased knowledge of precautions, Balance, Body mechanics, Decreased knowledge of use of DME, Flexibility, IADL, Pain, Vision, Cardiopulmonary status limiting activity, Dexterity, FMC, Proprioception, Strength, Tone, ROM Cognitive Skills: Attention, Orientation, Thought, Perception, Problem Solve, Safety Awareness, Sequencing, Memory, Learn Psychosocial Skills: Habits, Routines and Behaviors   Visit Diagnosis: Hemiplegia and hemiparesis  following cerebral infarction affecting left dominant side (HCC)  Muscle weakness (generalized)  Other lack of coordination  Unsteadiness on feet    Problem List Patient Active Problem List   Diagnosis Date Noted  . Abnormality of gait 09/24/2019  . AKI (acute kidney injury) ([place])   . Fever   . Sepsis without acute organ dysfunction ([place])   . Elevated BUN   . Blood pressure increase diastolic   . Labile blood pressure   . Leukopenia   . Benign essential HTN   . Hypoalbuminemia   . Cerebrovascular accident (CVA) of right basal ganglia ([place]) 07/07/2019  . Acute ischemic stroke ([place])   . Dyslipidemia   . Dysphagia, post-stroke   . Polycythemia   . Stroke ([place]) 06/30/2019  . Prediabetes 12/23/2016  . Elevated LFTs  12/23/2016  . Pure hypercholesterolemia 12/21/2016  . Alcoholism ([place]) 12/21/2016  . Eczema 09/18/2013  . Essential hypertension 09/18/2013  . Gastroesophageal reflux disease without esophagitis 09/18/2013  . Tobacco abuse 09/18/2013    Carey Bullocks, OTR/L 09/28/2019, 12:22 PM  [facility] [address] [place], [name], [address] Phone: [phone]   Fax:  [phone]  Name: Russell Russell MRN: [id number] Date of Birth: 1950-07-17

## 2019-09-30 ENCOUNTER — Other Ambulatory Visit: Payer: Self-pay | Admitting: Physician Assistant

## 2019-09-30 ENCOUNTER — Telehealth: Payer: Self-pay | Admitting: Physician Assistant

## 2019-09-30 MED ORDER — METOPROLOL TARTRATE 25 MG PO TABS: 12.5000 mg | ORAL_TABLET | Freq: Two times a day (BID) | ORAL | 2 refills | Status: DC

## 2019-09-30 NOTE — Telephone Encounter (Signed)
MEDICATION: metoprolol tartrate (LOPRESSOR) 25 MG tablet  PHARMACY:  [id number] - [place], [place] - [address] Phone:  [phone]  Fax:  [phone]       Comments:   **Let patient know to contact pharmacy at the end of the day to make sure medication is ready. **  ** Please notify patient to allow 48-72 hours to process**  **Encourage patient to contact the pharmacy for refills or they can request refills through [facility]**

## 2019-09-30 NOTE — Telephone Encounter (Signed)
Rx sent to pharmacy   

## 2019-10-01 ENCOUNTER — Other Ambulatory Visit: Payer: Self-pay

## 2019-10-01 ENCOUNTER — Ambulatory Visit: Payer: BC Managed Care – PPO

## 2019-10-01 ENCOUNTER — Ambulatory Visit: Payer: BC Managed Care – PPO | Admitting: Speech Pathology

## 2019-10-01 ENCOUNTER — Ambulatory Visit: Payer: BC Managed Care – PPO | Admitting: Occupational Therapy

## 2019-10-01 ENCOUNTER — Encounter: Payer: Self-pay | Admitting: Occupational Therapy

## 2019-10-01 DIAGNOSIS — R2681 Unsteadiness on feet: Secondary | ICD-10-CM

## 2019-10-01 DIAGNOSIS — R471 Dysarthria and anarthria: Secondary | ICD-10-CM

## 2019-10-01 DIAGNOSIS — R2689 Other abnormalities of gait and mobility: Secondary | ICD-10-CM

## 2019-10-01 DIAGNOSIS — R41841 Cognitive communication deficit: Secondary | ICD-10-CM

## 2019-10-01 DIAGNOSIS — I69352 Hemiplegia and hemiparesis following cerebral infarction affecting left dominant side: Secondary | ICD-10-CM

## 2019-10-01 DIAGNOSIS — R278 Other lack of coordination: Secondary | ICD-10-CM

## 2019-10-01 DIAGNOSIS — M6281 Muscle weakness (generalized): Secondary | ICD-10-CM | POA: Diagnosis not present

## 2019-10-01 NOTE — Therapy (Signed)
[facility] [address] [place], [name], [address] Phone: [phone]   Fax:  [phone]  Physical Therapy Treatment/Re-Cert  Patient Details  Name: Russell Russell MRN: [id number] Date of Birth: [birth year] Referring Provider (PT): Lauraine Rinne, PA-C   Encounter Date: 10/01/2019   PT End of Session - 10/01/19 1235    Visit Number 15    Number of Visits 31    Date for PT Re-Evaluation 12/30/19   new POC for 8 weeks, Cert for 90 days   Authorization Type [employer] (follow medicare guidelines, 10th visit PN)    Progress Note Due on Visit 20    PT Start Time 1230    PT Stop Time 1315    PT Time Calculation (min) 45 min    Equipment Utilized During Treatment Gait belt    Activity Tolerance Patient tolerated treatment well    Behavior During Therapy [facility] for tasks assessed/performed           Past Medical History:  Diagnosis Date  . Alcohol abuse    drinks 1 gallon of Gin every weekend, nothing during the week x >15 yrs  . Elevated transaminase level    +elev bili: abd u/s 06/2014 showed stable small hepatic hemangiomas, o/w normal.  . Essential hypertension   . GERD (gastroesophageal reflux disease)   . Hyperlipidemia 03/2014   Atorv started-chol improved  . Impaired fasting glucose 03/2014  . Nephrolithiasis   . PUD (peptic ulcer disease)   . Stroke ([place])   . Tobacco dependence    chantix: "psych effects"    Past Surgical History:  Procedure Laterality Date  . COLONOSCOPY  2005   Recall 10 yrs ([place])  . LOOP RECORDER INSERTION N/A 07/03/2019   Procedure: LOOP RECORDER INSERTION;  Surgeon: Thompson Grayer, MD;  Location: [facility];  Service: Cardiovascular;  Laterality: N/A;  . removal of kidney stones  2006   cystoscopic--removed from ureter.  No prob since.    There were no vitals filed for this visit.   Subjective Assessment - 10/01/19 1234    Subjective Patient no new complaints. No  falls. Been walking in the home with RW and wife.    Patient is accompained by: Family member    Pertinent History PMH: HTN, alcohol abuse (ongoing use), HLD, GERD, PUD, tobacco use    Limitations Standing;Walking;House hold activities    Patient Stated Goals return to prior level of function, walk, "wheelchair is not apart of me" (wants to get out of the w/c)    Currently in Pain? No/denies              [facility] PT Assessment - 10/01/19 0001      Assessment   Medical Diagnosis R Basal Ganglia CVA    Referring Provider (PT) Lauraine Rinne, PA-C                         [facility] Adult PT Treatment/Exercise - 10/01/19 0001      Transfers   Transfers Sit to Stand;Stand to Sit    Sit to Stand 5: Supervision    Stand to Sit 5: Supervision    Stand Pivot Transfers 5: Supervision;4: Min guard    Stand Pivot Transfer Details (indicate cue type and reason) completed stand pivot transfer with RW, and intermittent CGA as needed. Verbal cues for improved turn and alignment needed prior to descent.     Comments completed x 10 sit <>  stands with 2" block placed under RLE to promote weight shifting and further strengthening of LLE.       Ambulation/Gait   Ambulation/Gait Yes    Ambulation/Gait Assistance 4: Min guard;4: Min assist    Ambulation/Gait Assistance Details continued gait training with RW, 115 ft x 2 with PT providing CGA throughout. Verbal cues for improved step length and posture throughout gait training. Pt continue to be highly reliant on vision for foot placement at this time.     Ambulation Distance (Feet) 115 Feet   x 2   Assistive device Rolling walker    Gait Pattern Step-through pattern;Step-to pattern;Decreased stride length;Decreased stance time - right;Decreased step length - left    Ambulation Surface Level;Indoor      Neuro Re-ed    Neuro Re-ed Details  Completed standing tolerance activity including standing without UE support and using LUE to play connect  4, completed x 6 minutes without rest break.                  PT Education - 10/01/19 1416    Education Details Educated on Progress toward LTG's    Person(s) Educated Patient;Spouse    Methods Explanation    Comprehension Verbalized understanding            PT Short Term Goals - 10/01/19 1251      PT SHORT TERM GOAL #1   Title Patient will be independent with Initial HEP, with wifes assistance    Baseline Patient reports independence with HEP with wifes assistance at this time, reports completing exercises everyday.    Time 4    Period Weeks    Status Achieved    Target Date 09/03/19      PT SHORT TERM GOAL #2   Title Patient will demonstrate ability to complete stand static w/ LRAD and midline alignment x 4 minutes w/o LOB to demonstrate improved tolerance for ADL tasks    Baseline able to stand for 3 minutes 53 secs with RW and proper alignment w/o LOB, 6 minutes on 7/22    Time 4    Period Weeks    Status Achieved    Target Date 09/03/19      PT SHORT TERM GOAL #3   Title Patient will demo ability to complete stand pivot transfer w/ LRAD and Min A for improved functional mobility    Baseline completed with RW and min A    Time 4    Period Weeks    Status Achieved    Target Date 09/03/19      PT SHORT TERM GOAL #4   Title Patient will demo ability to ambulate 25 feet w/ LRAD and Min A for improved mobility    Baseline ambulate 35 ft w/ RW and Min A    Time 4    Period Weeks    Status Achieved    Target Date 09/03/19             PT Long Term Goals - 10/01/19 1237      PT LONG TERM GOAL #1   Title Patient will be independent with Final HEP, with wifes assistance    Baseline Continue to progress HEP at this time    Time 8    Period Weeks    Status On-going      PT LONG TERM GOAL #2   Title Patient will demonstrate ability to complete transfer from w/c <> mat w/ CGA to demo improved independence with transfers.  Baseline Mod A, CGA on 7/22     Time 8    Period Weeks    Status Achieved      PT LONG TERM GOAL #3   Title Patient will demonstrate ability to ambulate 50 ft w/ LRAD and min A to demonstrate improved household mobility    Baseline 115 ft with CGA to Min A    Time 8    Period Weeks    Status Achieved      PT LONG TERM GOAL #4   Title Patient will demonstrate ability to complete sit <> stand Mod I to demonstrate improved functional mobility    Baseline Supervision    Time 8    Period Weeks    Status On-going              PT Short Term Goals - 10/01/19 1251      PT SHORT TERM GOAL #1   Title Patient will demonstrate ability to ambulate >400 ft on indoor level surfaces with LRAD and CGA to demonstrate improved mobility    Baseline 200 ft, Min Guard to [employer]    Time 4    Period Weeks    Status New    Target Date 10/29/19      PT SHORT TERM GOAL #2   Title Patient will demonstrate ability to ascend/descend 1 curb with [employer] and RW to demo improved entrance/exit of home    Baseline Min A    Time 4    Period Weeks    Status New    Target Date 10/29/19      PT SHORT TERM GOAL #3   Title --    Baseline --    Time --    Period --    Status --    Target Date --      PT SHORT TERM GOAL #4   Title --    Baseline --    Time --    Period --    Status --    Target Date --           PT Long Term Goals - 10/01/19 1237      PT LONG TERM GOAL #1   Title Patient will be independent with Final HEP, with wifes assistance (ALL LTG's Due: 11/26/2019)    Baseline Continue to progress HEP at this time    Time 8    Period Weeks    Status On-going    Target Date 11/26/19      PT LONG TERM GOAL #2   Title Patient will demonstrate ability to complete all transfers, Mod I, LRAD to demonstrate improved independence with functional mobility    Baseline CGA on 7/22    Time 8    Period Weeks    Status New    Target Date 11/26/19      PT LONG TERM GOAL #3   Title Patient will demo ability to ambulate  >200 ft on outdoor/unlevel surfaces to demo improved community mobility    Baseline not yet assessed    Time 8    Period Weeks    Status New    Target Date 11/26/19      PT LONG TERM GOAL #4   Title Patient will demonstrate ability to complete sit <> stand, Mod I, w/ LRAD to demonstrate improved functional mobility    Baseline Supv Level 7/22    Time 8    Period Weeks    Status Revised  Target Date 11/26/19      PT LONG TERM GOAL #5   Title Patient will demo ability to ascend/descend curb with LRAD and Supervision to demonstrate improved ability to enter/exit home    Baseline Min A    Time 8    Period Weeks    Status New    Target Date 11/26/19                Plan - 10/01/19 1417    Clinical Impression Statement Today's skilled PT session focused on assesment of patient's progress toward all LTG's. Patient able to meet 2 out of 4 LTG's today, and has demonstrated improved standing tolerance, functional mobility with transfers, and ambulation with RW. Patient still demo requiring Min Guard to [employer] A with certain activites at this time. Patient will continue to benefit from skilled PT services to progress toward all unmet goals, and maximize functional mobility    Personal Factors and Comorbidities Comorbidity 3+    Comorbidities HTN, alcohol abuse (ongoing use), HLD, GERD, PUD, tobacco use    Examination-Activity Limitations Bed Mobility;Dressing;Locomotion Level;Stairs;Stand;Toileting;Transfers;Bathing    Examination-Participation Restrictions Driving;Interpersonal Relationship;Yard Work    [employer] Evolving/Moderate complexity    Rehab Potential Good    PT Frequency 2x / week    PT Duration 8 weeks    PT Treatment/Interventions ADLs/Self Care Home Management;Electrical Stimulation;DME Instruction;Moist Heat;Cryotherapy;Gait training;Stair training;Functional mobility training;Therapeutic activities;Therapeutic exercise;Balance  training;Neuromuscular re-education;Patient/family education;Orthotic Fit/Training;Wheelchair mobility training;Passive range of motion    PT Next Visit Plan continue gait with RW, NMR for improved R lateral weight shift. transfers, working on AD management with turns, weight shifting activities, activities in tall kneeling.    Consulted and Agree with Plan of Care Patient;Family member/caregiver    Family Member Consulted Wife           Patient will benefit from skilled therapeutic intervention in order to improve the following deficits and impairments:  Abnormal gait, Decreased coordination, Difficulty walking, Impaired tone, Decreased safety awareness, Decreased endurance, Decreased activity tolerance, Pain, Decreased balance, Decreased knowledge of use of DME, Postural dysfunction, Impaired sensation, Decreased strength, Decreased mobility  Visit Diagnosis: Hemiplegia and hemiparesis following cerebral infarction affecting left dominant side (HCC)  Muscle weakness (generalized)  Unsteadiness on feet  Other abnormalities of gait and mobility     Problem List Patient Active Problem List   Diagnosis Date Noted  . Abnormality of gait 09/24/2019  . AKI (acute kidney injury) ([place])   . Fever   . Sepsis without acute organ dysfunction ([place])   . Elevated BUN   . Blood pressure increase diastolic   . Labile blood pressure   . Leukopenia   . Benign essential HTN   . Hypoalbuminemia   . Cerebrovascular accident (CVA) of right basal ganglia ([place]) 07/07/2019  . Acute ischemic stroke ([place])   . Dyslipidemia   . Dysphagia, post-stroke   . Polycythemia   . Stroke ([place]) 06/30/2019  . Prediabetes 12/23/2016  . Elevated LFTs 12/23/2016  . Pure hypercholesterolemia 12/21/2016  . Alcoholism ([place]) 12/21/2016  . Eczema 09/18/2013  . Essential hypertension 09/18/2013  . Gastroesophageal reflux disease without esophagitis 09/18/2013  . Tobacco abuse 09/18/2013    Jones Bales, PT,  DPT 10/01/2019, 2:24 PM  [facility] [address] [place], [name], [address] Phone: [phone]   Fax:  [phone]  Name: Russell Russell MRN: [id number] Date of Birth: 1951/02/11

## 2019-10-01 NOTE — Therapy (Signed)
[facility] [address] [place], [name], [address] Phone: [phone]   Fax:  [phone]  Speech Language Pathology Treatment  Patient Details  Name: Russell Russell MRN: [id number] Date of Birth: [birth year] Referring Provider (SLP): Stann Mainland, [place]   Encounter Date: 10/01/2019   End of Session - 10/01/19 1331    Visit Number 12    Number of Visits 17    Date for SLP Re-Evaluation 11/05/19    SLP Start Time 1100    SLP Stop Time  1140    SLP Time Calculation (min) 40 min    Activity Tolerance Patient tolerated treatment well           Past Medical History:  Diagnosis Date  . Alcohol abuse    drinks 1 gallon of Gin every weekend, nothing during the week x >15 yrs  . Elevated transaminase level    +elev bili: abd u/s 06/2014 showed stable small hepatic hemangiomas, o/w normal.  . Essential hypertension   . GERD (gastroesophageal reflux disease)   . Hyperlipidemia 03/2014   Atorv started-chol improved  . Impaired fasting glucose 03/2014  . Nephrolithiasis   . PUD (peptic ulcer disease)   . Stroke ([place])   . Tobacco dependence    chantix: "psych effects"    Past Surgical History:  Procedure Laterality Date  . COLONOSCOPY  2005   Recall 10 yrs ([place])  . LOOP RECORDER INSERTION N/A 07/03/2019   Procedure: LOOP RECORDER INSERTION;  Surgeon: Thompson Grayer, MD;  Location: [facility];  Service: Cardiovascular;  Laterality: N/A;  . removal of kidney stones  2006   cystoscopic--removed from ureter.  No prob since.    There were no vitals filed for this visit.   Subjective Assessment - 10/01/19 1104    Subjective "I'm holding back," when SLP remarked on quiet voice    Currently in Pain? No/denies                 ADULT SLP TREATMENT - 10/01/19 1104      General Information   Behavior/Cognition Alert;Cooperative;Pleasant mood      Treatment Provided   Treatment provided  Cognitive-Linquistic      Pain Assessment   Pain Assessment No/denies pain      Cognitive-Linquistic Treatment   Treatment focused on Dysarthria;Cognition    Skilled Treatment Pt brought binder today. Attempted to open to SLP tasks once wife placed binder in front of him, which he eventually did with extra time. Targeted speech for duration of session with secondary focus on cognitive load. Rare min A for effort, rapid burst with EMST reps; counted correctly for 4/5 sets (error occurred x1 when SLP had to cue pt mid-set). With phrase level reading, patient required frequent mod cues initially for intensity/overarticulation, fading to rare min A. With increased cognitive load and for carryover between tasks, usual mod A required. SLP modeled loudness in simple conversation and pt loudness was still sub-WNL (low 60s dB) but ~95% intelligible. Wife reports pt talks louder on the phone; encouraged pt to make one phone call every day to practice louder speech.       Assessment / Recommendations / Plan   Plan Continue with current plan of care      Progression Toward Goals   Progression toward goals Progressing toward goals              SLP Short Term Goals - 10/01/19 1332      SLP  SHORT TERM GOAL #1   Title pt will demo the knowledge that he can use a memory system for daily events, ID'ing prospective and past events, medication management, and appoitmetn tracking, etc x 3 sessions    Period --   or 9 total visits, for all STGs   Status Not Met      SLP SHORT TERM GOAL #2   Title pt will demo selective atteniton for 5 mintues to perform simple cognitive lingiustic tasks in a min-mod noisy environment x2 sessions    Status Partially Met      SLP SHORT TERM GOAL #3   Title pt will tell SLP 2 congitive deficits affecting his life at home post-CVA in two sessions    Status Deferred      SLP SHORT TERM GOAL #4   Title pt will incr expiratory muscle strength taken in first 2 ST sessions via  spirometer    Baseline MEP 45 on 08/13/19    Time 1    Status Partially Met            SLP Long Term Goals - 10/01/19 1333      SLP LONG TERM GOAL #1   Title pt will demo simple conversation of 5 mintues with average mid 60s dB x 3 sessions    Time 3    Period Weeks   or 17 sessions   Status On-going      SLP LONG TERM GOAL #2   Title pt will demo selective atteniton for 7 mintues to perform simple cognitive lingiustic tasks in a min noisy environment x2 sessions    Time 2    Period Weeks    Status On-going      SLP LONG TERM GOAL #3   Title pt will demo WFL alternating attention in simple cognitve linguistic tasks with occasional min A in 3 sessions    Status Deferred      SLP LONG TERM GOAL #4   Title pt will independnently access memory notebook/planner for appoitnment management, to-do-lists, recall of prospective and past events, etc in 3 sessions    Time 3    Period Weeks    Status On-going            Plan - 10/01/19 1332    Clinical Impression Statement Pt presents today with  cognitive communication deficits which contiue to affect his ability to be independent and at their current level will require consistent assistance from wife/family for most needs. Pt req'd SLP mod assistance to access memory book to find appropriate sections.. Pt also presents with reduced voice volume (low 60s dB most of the time), affecting intelligibility with wife at home. SLP recommends skilled ST to address cognitive linguistics and dysarthria c/b reduced voice volume.    Speech Therapy Frequency 2x / week    Duration --   8 weeks or 17 total visits   Treatment/Interventions Environmental controls;Other (comment);Compensatory techniques;Functional tasks;SLP instruction and feedback;Cueing hierarchy;Cognitive reorganization;Patient/family education;Internal/external aids   EMST   Potential to Achieve Goals Fair    Potential Considerations Severity of impairments;Ability to learn/carryover  information    Consulted and Agree with Plan of Care Patient;Family member/caregiver    Family Member Consulted wife           Patient will benefit from skilled therapeutic intervention in order to improve the following deficits and impairments:   Dysarthria and anarthria  Cognitive communication deficit    Problem List Patient Active Problem List   Diagnosis Date  Noted  . Abnormality of gait 09/24/2019  . AKI (acute kidney injury) ([place])   . Fever   . Sepsis without acute organ dysfunction ([place])   . Elevated BUN   . Blood pressure increase diastolic   . Labile blood pressure   . Leukopenia   . Benign essential HTN   . Hypoalbuminemia   . Cerebrovascular accident (CVA) of right basal ganglia ([place]) 07/07/2019  . Acute ischemic stroke ([place])   . Dyslipidemia   . Dysphagia, post-stroke   . Polycythemia   . Stroke ([place]) 06/30/2019  . Prediabetes 12/23/2016  . Elevated LFTs 12/23/2016  . Pure hypercholesterolemia 12/21/2016  . Alcoholism ([place]) 12/21/2016  . Eczema 09/18/2013  . Essential hypertension 09/18/2013  . Gastroesophageal reflux disease without esophagitis 09/18/2013  . Tobacco abuse 09/18/2013   Deneise Lever, [place], CCC-SLP Speech-Language Pathologist  Aliene Altes 10/01/2019, 1:34 PM  [facility] [address] [place], [name], [address] Phone: [phone]   Fax:  [phone]   Name: [name] Zagal MRN: [id number] Date of Birth: November 05, 1950

## 2019-10-01 NOTE — Patient Instructions (Signed)
Keep up your regular practice with the breathing device and your loud speech. Also, practice having a loud conversation on the phone- every day!

## 2019-10-01 NOTE — Therapy (Addendum)
[facility] [address] [place], [name], [address] Phone: [phone]   Fax:  [phone]  Occupational Therapy Treatment  Patient Details  Name: Russell Russell MRN: [id number] Date of Birth: [birth year] Referring Provider (OT): [name]   Encounter Date: 10/01/2019   OT End of Session - 10/01/19 1212    Visit Number 8    Number of Visits 17    Date for OT Re-Evaluation 10/05/19    Authorization Type BCBS? "Follow Medicare Guidelines"    Authorization - Number of Visits 8    Progress Note Due on Visit 10    OT Start Time 1145    OT Stop Time 1225    OT Time Calculation (min) 40 min    Activity Tolerance Patient tolerated treatment well    Behavior During Therapy [facility] for tasks assessed/performed           Past Medical History:  Diagnosis Date  . Alcohol abuse    drinks 1 gallon of Gin every weekend, nothing during the week x >15 yrs  . Elevated transaminase level    +elev bili: abd u/s 06/2014 showed stable small hepatic hemangiomas, o/w normal.  . Essential hypertension   . GERD (gastroesophageal reflux disease)   . Hyperlipidemia 03/2014   Atorv started-chol improved  . Impaired fasting glucose 03/2014  . Nephrolithiasis   . PUD (peptic ulcer disease)   . Stroke ([place])   . Tobacco dependence    chantix: "psych effects"    Past Surgical History:  Procedure Laterality Date  . COLONOSCOPY  2005   Recall 10 yrs ([place])  . LOOP RECORDER INSERTION N/A 07/03/2019   Procedure: LOOP RECORDER INSERTION;  Surgeon: Thompson Grayer, MD;  Location: [facility];  Service: Cardiovascular;  Laterality: N/A;  . removal of kidney stones  2006   cystoscopic--removed from ureter.  No prob since.    There were no vitals filed for this visit.   Subjective Assessment - 10/01/19 1146    Subjective  Denies pain    Pertinent History HTN, ETOH use, smoker, loop recorder    Currently in Pain? No/denies               Pt simulated donning pants over legs with supervision /v.c using theraband loop x 2, 2nd trial LLE crossed across knee. Therapist encouraged pt to donn pants over legs at home in seated. Pt's wife has been performing. Closed chain chest press and low-mid range shoulder flexion min v.c  Standing to perform low to mid range functional reach wih LUE and sustained grip placing graded clothespins on vertical antennae, min guard for balance, min v.c and several rest breaks required.                     OT Short Term Goals - 09/28/19 1008      OT SHORT TERM GOAL #1   Title Patient will compelte a home exercise program designed to improve functional use of LUE with mod cueing and set up due 09/05/19    Status Achieved      OT SHORT TERM GOAL #2   Title Patient will don a front opening shirt with fasteners (buttons/zipper) with mod assist    Status Achieved   Pt demo in clinic with extra time and occasional min assist     OT SHORT TERM GOAL #3   Title Patient will demonstrate ability to cut food on plate with min assist  Status Achieved   per pt report     OT SHORT TERM GOAL #4   Title Patient will demonstrate low reach, grasp, release of lightweight small object (less than 1 lb) with min assist    Status Achieved             OT Long Term Goals - 10/01/19 1442      OT LONG TERM GOAL #1   Title Patient will complete an updated HEP to address LUE functioning due 10/05/19    Status On-going      OT LONG TERM GOAL #2   Title Patient will walk with assistive device into bathroom to use toilet with min assist    Status On-going      OT LONG TERM GOAL #3   Title Patient will transfer onto shower seat with min assist    Status On-going      OT LONG TERM GOAL #4   Title Patient will obtain and release a 2-3 lb object at chest height shelf using LUE and min assist    Status On-going                 Plan - 10/01/19 1213    Clinical Impression  Statement Pt is progressing towards goals with improving standing balance and functional reach.    Body Structure / Function / Physical Skills ADL;Coordination;Endurance;GMC;UE functional use;Sensation;Decreased knowledge of precautions;Balance;Body mechanics;Decreased knowledge of use of DME;Flexibility;IADL;Pain;Vision;Cardiopulmonary status limiting activity;Dexterity;FMC;Proprioception;Strength;Tone;ROM    Cognitive Skills Attention;Orientation;Thought;Perception;Problem Solve;Safety Awareness;Sequencing;Memory;Learn    Psychosocial Skills Habits;Routines and Behaviors    Rehab Potential Good    OT Frequency 2x / week    OT Duration 8 weeks    OT Treatment/Interventions Self-care/ADL training;Electrical Stimulation;Therapeutic exercise;Visual/perceptual remediation/compensation;Patient/family education;Splinting;Neuromuscular education;Moist Heat;Aquatic Therapy;Balance training;Therapeutic activities;Functional Mobility Training;Fluidtherapy;DME and/or AE instruction;Manual Therapy;Cognitive remediation/compensation    Plan check goals and renew next visit    Consulted and Agree with Plan of Care Patient;Family member/caregiver    Family Member Consulted Wife, [name]           Patient will benefit from skilled therapeutic intervention in order to improve the following deficits and impairments:   Body Structure / Function / Physical Skills: ADL, Coordination, Endurance, GMC, UE functional use, Sensation, Decreased knowledge of precautions, Balance, Body mechanics, Decreased knowledge of use of DME, Flexibility, IADL, Pain, Vision, Cardiopulmonary status limiting activity, Dexterity, FMC, Proprioception, Strength, Tone, ROM Cognitive Skills: Attention, Orientation, Thought, Perception, Problem Solve, Safety Awareness, Sequencing, Memory, Learn Psychosocial Skills: Habits, Routines and Behaviors   Visit Diagnosis: Hemiplegia and hemiparesis following cerebral infarction affecting left  dominant side (HCC)  Muscle weakness (generalized)  Other lack of coordination  Unsteadiness on feet  Other abnormalities of gait and mobility    Problem List Patient Active Problem List   Diagnosis Date Noted  . Abnormality of gait 09/24/2019  . AKI (acute kidney injury) ([place])   . Fever   . Sepsis without acute organ dysfunction ([place])   . Elevated BUN   . Blood pressure increase diastolic   . Labile blood pressure   . Leukopenia   . Benign essential HTN   . Hypoalbuminemia   . Cerebrovascular accident (CVA) of right basal ganglia ([place]) 07/07/2019  . Acute ischemic stroke ([place])   . Dyslipidemia   . Dysphagia, post-stroke   . Polycythemia   . Stroke ([place]) 06/30/2019  . Prediabetes 12/23/2016  . Elevated LFTs 12/23/2016  . Pure hypercholesterolemia 12/21/2016  . Alcoholism ([place]) 12/21/2016  . Eczema 09/18/2013  . Essential  hypertension 09/18/2013  . Gastroesophageal reflux disease without esophagitis 09/18/2013  . Tobacco abuse 09/18/2013    Nizar Cutler 10/01/2019, 2:42 PM Theone Murdoch, OTR/L [phone] Phone: [phone] 2:43 PM 10/01/19 [facility] [address] [place], [name], [address] Phone: [phone]   Fax:  [phone]  Name: Yehonatan Grandison MRN: [id number] Date of Birth: Jun 08, 1950

## 2019-10-05 ENCOUNTER — Ambulatory Visit: Payer: BC Managed Care – PPO | Admitting: Occupational Therapy

## 2019-10-05 ENCOUNTER — Ambulatory Visit: Payer: BC Managed Care – PPO

## 2019-10-05 ENCOUNTER — Other Ambulatory Visit: Payer: Self-pay

## 2019-10-05 ENCOUNTER — Encounter: Payer: Self-pay | Admitting: Occupational Therapy

## 2019-10-05 DIAGNOSIS — I69352 Hemiplegia and hemiparesis following cerebral infarction affecting left dominant side: Secondary | ICD-10-CM

## 2019-10-05 DIAGNOSIS — R41840 Attention and concentration deficit: Secondary | ICD-10-CM

## 2019-10-05 DIAGNOSIS — R2681 Unsteadiness on feet: Secondary | ICD-10-CM

## 2019-10-05 DIAGNOSIS — R482 Apraxia: Secondary | ICD-10-CM

## 2019-10-05 DIAGNOSIS — M6281 Muscle weakness (generalized): Secondary | ICD-10-CM

## 2019-10-05 DIAGNOSIS — R278 Other lack of coordination: Secondary | ICD-10-CM

## 2019-10-05 DIAGNOSIS — R41841 Cognitive communication deficit: Secondary | ICD-10-CM

## 2019-10-05 DIAGNOSIS — R208 Other disturbances of skin sensation: Secondary | ICD-10-CM

## 2019-10-05 DIAGNOSIS — R471 Dysarthria and anarthria: Secondary | ICD-10-CM

## 2019-10-05 DIAGNOSIS — R2689 Other abnormalities of gait and mobility: Secondary | ICD-10-CM

## 2019-10-05 NOTE — Patient Instructions (Signed)
Do your new blowing device 25 reps, 5 at a time.

## 2019-10-05 NOTE — Therapy (Signed)
[facility] [address] [place], [name], [address] Phone: [phone]   Fax:  [phone]  Speech Language Pathology Treatment  Patient Details  Name: Russell Russell MRN: [id number] Date of Birth: [birth year] Referring Provider (SLP): Stann [place], [place]   Encounter Date: 10/05/2019   End of Session - 10/05/19 1216    Visit Number 13    Number of Visits 17    Date for SLP Re-Evaluation 11/05/19    SLP Start Time 1151    SLP Stop Time  1231    SLP Time Calculation (min) 40 min    Activity Tolerance Patient tolerated treatment well           Past Medical History:  Diagnosis Date  . Alcohol abuse    drinks 1 gallon of Gin every weekend, nothing during the week x >15 yrs  . Elevated transaminase level    +elev bili: abd u/s 06/2014 showed stable small hepatic hemangiomas, o/w normal.  . Essential hypertension   . GERD (gastroesophageal reflux disease)   . Hyperlipidemia 03/2014   Atorv started-chol improved  . Impaired fasting glucose 03/2014  . Nephrolithiasis   . PUD (peptic ulcer disease)   . Stroke ([place])   . Tobacco dependence    chantix: "psych effects"    Past Surgical History:  Procedure Laterality Date  . COLONOSCOPY  2005   Recall 10 yrs ([place])  . LOOP RECORDER INSERTION N/A 07/03/2019   Procedure: LOOP RECORDER INSERTION;  Surgeon: Thompson Grayer, MD;  Location: [facility];  Service: Cardiovascular;  Laterality: N/A;  . removal of kidney stones  2006   cystoscopic--removed from ureter.  No prob since.    There were no vitals filed for this visit.   Subjective Assessment - 10/05/19 1216    Subjective Pt reported his effort with expiratory device 4/10.    Patient is accompained by: Family member   wife-wanda   Currently in Pain? No/denies                 ADULT SLP TREATMENT - 10/05/19 0001      General Information   Behavior/Cognition Alert;Decreased sustained  attention;Cooperative;Pleasant mood      Treatment Provided   Treatment provided Cognitive-Linquistic      Cognitive-Linquistic Treatment   Treatment focused on Dysarthria;Cognition    Skilled Treatment SLP discussed with pt/wife today remaining 5 sessions of ST - pt and wife said they were comfortable about continuing respiratory trainer and louder speech practice at home after pt's current treatment plan is fulfilled (17 total sessions). Due to pt stating 4/10 effort with current device and consistently blowing 4th and 5th repeitions softer than first three, SLP provided mod-max cues for strong blow and pt responded with 5/5 strong blows, consistently. SLP graduated pt's device to blue EMST device. On lowest setting with consistent cues for hard blow, pt reported 8/10 effort. SLP educated wife she may want to adjust the device after asking pt if he was producing 8/10 effort, and that SLP would educate wife how to do this prior to d/c.       Assessment / Recommendations / Plan   Plan Continue with current plan of care      Progression Toward Goals   Progression toward goals Progressing toward goals   d/c in approx 4-5 sessions           SLP Education - 10/05/19 1727    Education Details do smae number reps with  new blowing device, 17 sessions ST total, cont with blowing and speech practice after discharge from ST    Person(s) Educated Spouse;Patient    Methods Explanation    Comprehension Verbalized understanding;Need further instruction            SLP Short Term Goals - 10/01/19 1332      SLP SHORT TERM GOAL #1   Title pt will demo the knowledge that he can use a memory system for daily events, ID'ing prospective and past events, medication management, and appoitmetn tracking, etc x 3 sessions    Period --   or 9 total visits, for all STGs   Status Not Met      SLP SHORT TERM GOAL #2   Title pt will demo selective atteniton for 5 mintues to perform simple cognitive lingiustic  tasks in a min-mod noisy environment x2 sessions    Status Partially Met      SLP SHORT TERM GOAL #3   Title pt will tell SLP 2 congitive deficits affecting his life at home post-CVA in two sessions    Status Deferred      SLP SHORT TERM GOAL #4   Title pt will incr expiratory muscle strength taken in first 2 ST sessions via spirometer    Baseline MEP 45 on 08/13/19    Time 1    Status Partially Met            SLP Long Term Goals - 10/05/19 1730      SLP LONG TERM GOAL #1   Title pt will demo simple conversation of 5 mintues with average mid 60s dB x 3 sessions    Time 1    Period Weeks   or 17 sessions   Status On-going      SLP LONG TERM GOAL #2   Title pt will demo selective atteniton for 7 mintues to perform simple cognitive lingiustic tasks in a min noisy environment x2 sessions    Time 1    Period Weeks    Status On-going      SLP LONG TERM GOAL #3   Title pt will demo WFL alternating attention in simple cognitve linguistic tasks with occasional min A in 3 sessions    Status Deferred      SLP LONG TERM GOAL #4   Title pt will independnently access memory notebook/planner for appoitnment management, to-do-lists, recall of prospective and past events, etc in 3 sessions    Time 1    Period Weeks    Status On-going            Plan - 10/05/19 1728    Clinical Impression Statement Pt presents today with  cognitive communication deficits which contiue to affect his ability to be independent and at their current level will require consistent assistance from wife/family for most needs. Pt req'd SLP mod assistance to access memory book to find appropriate sections.. Wife states pt's voice seems louder in the last 1-2 weeks but she still needs to ask him to repeat. Pt and wife are comfortable completing EMST and speech intelligibility tasks at home after d/c from [place] in next 4-5 visits (until 17 total visits). SLP recommends skilled ST to address cognitive linguistics and  dysarthria c/b reduced voice volume.    Speech Therapy Frequency 2x / week    Duration --   8 weeks or 17 total visits   Treatment/Interventions Environmental controls;Other (comment);Compensatory techniques;Functional tasks;SLP instruction and feedback;Cueing hierarchy;Cognitive reorganization;Patient/family education;Internal/external aids   EMST  Potential to Achieve Goals Fair    Potential Considerations Severity of impairments;Ability to learn/carryover information    Consulted and Agree with Plan of Care Patient;Family member/caregiver    Family Member Consulted wife           Patient will benefit from skilled therapeutic intervention in order to improve the following deficits and impairments:   Dysarthria and anarthria  Cognitive communication deficit    Problem List Patient Active Problem List   Diagnosis Date Noted  . Abnormality of gait 09/24/2019  . AKI (acute kidney injury) ([place])   . Fever   . Sepsis without acute organ dysfunction ([place])   . Elevated BUN   . Blood pressure increase diastolic   . Labile blood pressure   . Leukopenia   . Benign essential HTN   . Hypoalbuminemia   . Cerebrovascular accident (CVA) of right basal ganglia ([place]) 07/07/2019  . Acute ischemic stroke ([place])   . Dyslipidemia   . Dysphagia, post-stroke   . Polycythemia   . Stroke ([place]) 06/30/2019  . Prediabetes 12/23/2016  . Elevated LFTs 12/23/2016  . Pure hypercholesterolemia 12/21/2016  . Alcoholism ([place]) 12/21/2016  . Eczema 09/18/2013  . Essential hypertension 09/18/2013  . Gastroesophageal reflux disease without esophagitis 09/18/2013  . Tobacco abuse 09/18/2013    [facility] ,[place], [place]  10/05/2019, 5:31 PM  [facility] [address] [place], [name], [address] Phone: [phone]   Fax:  [phone]   Name: Russell Russell MRN: [id number] Date of Birth: June 06, 1950

## 2019-10-05 NOTE — Therapy (Signed)
[facility] [address] [place], [name], [address] Phone: [phone]   Fax:  [phone]  Occupational Therapy Treatment  Patient Details  Name: Russell Russell MRN: [id number] Date of Birth: 1951/02/29 Referring Provider (OT): [name]   Encounter Date: 10/05/2019   OT End of Session - 10/05/19 1506    Visit Number 9    Number of Visits 25    Date for OT Re-Evaluation 12/04/19    Authorization Type BCBS? "Follow Medicare Guidelines"    Authorization - Number of Visits 9    Progress Note Due on Visit 19    OT Start Time 1315    OT Stop Time 1400    OT Time Calculation (min) 45 min    Activity Tolerance Patient tolerated treatment well    Behavior During Therapy [facility] for tasks assessed/performed           Past Medical History:  Diagnosis Date  . Alcohol abuse    drinks 1 gallon of Gin every weekend, nothing during the week x >15 yrs  . Elevated transaminase level    +elev bili: abd u/s 06/2014 showed stable small hepatic hemangiomas, o/w normal.  . Essential hypertension   . GERD (gastroesophageal reflux disease)   . Hyperlipidemia 03/2014   Atorv started-chol improved  . Impaired fasting glucose 03/2014  . Nephrolithiasis   . PUD (peptic ulcer disease)   . Stroke ([place])   . Tobacco dependence    chantix: "psych effects"    Past Surgical History:  Procedure Laterality Date  . COLONOSCOPY  2005   Recall 10 yrs ([place])  . LOOP RECORDER INSERTION N/A 07/03/2019   Procedure: LOOP RECORDER INSERTION;  Surgeon: Thompson Grayer, MD;  Location: [facility];  Service: Cardiovascular;  Laterality: N/A;  . removal of kidney stones  2006   cystoscopic--removed from ureter.  No prob since.    There were no vitals filed for this visit.   Subjective Assessment - 10/05/19 1316    Subjective  Since I started with the walker I am doing a lot better.    Patient is accompanied by: Family member     Pertinent History HTN, ETOH use, smoker, loop recorder    Currently in Pain? No/denies    Pain Score 0-No pain                        OT Treatments/Exercises (OP) - 10/05/19 1501      ADLs   LB Dressing Reviewed long term goals and plans to recertify for additional OT.   Patient has met initial OT goals, and has potential for greater functional independence.        Neurological Re-education Exercises   Other Exercises 1 Neuromuscular reeducation to address static to dynamic stand balance, with emphasis on alignment for standing, and weight shifting to right to offlaod left to allow stepping with left foot.  Patient very visually focused on hs feet with walking.  Discussed with patient and wife that functional walking indicates he can turn, avoid obstacles, and move backward, and around obstacles safely.  Patient with persistent perceptual deficits which challenge these motor plans - needs continued practice, cueing and facilitation and repetition to improve here.  Worked on visually locating objects while walking, and safe use of walker with turns, directional changes, etc.                    OT Education -  10/05/19 1506    Education Details reviewed remaining goals and potential new OT goals for recertification.    Person(s) Educated Patient;Spouse    Methods Explanation;Demonstration;Verbal cues    Comprehension Need further instruction;Verbalized understanding;Returned demonstration            OT Short Term Goals - 10/05/19 1510      OT SHORT TERM GOAL #1   Title Patient will compelte a home exercise program designed to improve functional use of LUE with mod cueing and set up due 09/05/19    Status Achieved      OT SHORT TERM GOAL #2   Title Patient will don a front opening shirt with fasteners (buttons/zipper) with mod assist    Status Achieved      OT SHORT TERM GOAL #3   Title Patient will demonstrate ability to cut food on plate with min assist     Status Achieved      OT SHORT TERM GOAL #4   Title Patient will demonstrate low reach, grasp, release of lightweight small object (less than 1 lb) with min assist    Status Achieved      OT SHORT TERM GOAL #5   Title Patient will ambulate into bathroom and cmplete toileting with supervision and verbal cueing assistance due 11/04/19    Time 4    Period Weeks    Status New    Target Date 11/04/19      Additional Short Term Goals   Additional Short Term Goals Yes      OT SHORT TERM GOAL #6   Title Patient will transport necessary items for bathing and dressing into bathroom to encourage greater independence with showering.    Time 4    Period Weeks    Status New             OT Long Term Goals - 10/05/19 1513      OT LONG TERM GOAL #1   Title Patient will complete an updated HEP to address LUE functioning due 10/05/19    Status On-going      OT LONG TERM GOAL #2   Title Patient will walk with assistive device into bathroom to use toilet with min assist    Status Achieved      OT LONG TERM GOAL #3   Title Patient will transfer onto shower seat with min assist    Status Achieved      OT LONG TERM GOAL #4   Title Patient will obtain and release a 2-3 lb object at chest height shelf using LUE and min assist    Status Achieved      OT LONG TERM GOAL #5   Title Patient will toilet himself with modified independence due 12/04/19    Time 8    Period Weeks    Status New    Target Date 12/04/19      Long Term Additional Goals   Additional Long Term Goals Yes      OT LONG TERM GOAL #6   Title Patient will shower self with supervision/set up assistance    Time 8    Period Weeks    Status New      OT LONG TERM GOAL #7   Title Patient will grocery shop with wife, and will locate and retrieve at least 5 items from list with only minimal assistance    Time 8    Period Weeks    Status New  Plan - 10/05/19 1508    Clinical Impression Statement  Patient is showing steady progress, and is much more involved in his basic self care skills due to improved functional mobility, improved static stand balance, improved functional use of LUE/LLE, and improved activity tolerance.  Patient will benefit from continued OT intervention to further improve his independence and autonomy with ADL/IADL.    OT Frequency 2x / week    OT Duration 8 weeks    OT Treatment/Interventions Self-care/ADL training;Electrical Stimulation;Therapeutic exercise;Visual/perceptual remediation/compensation;Patient/family education;Splinting;Neuromuscular education;Moist Heat;Aquatic Therapy;Balance training;Therapeutic activities;Functional Mobility Training;Fluidtherapy;DME and/or AE instruction;Manual Therapy;Cognitive remediation/compensation    Plan Dynamic stand balance, functional walking - opening doors, drawers, turning, backing up - NMR LUE    Consulted and Agree with Plan of Care Patient;Family member/caregiver    Family Member Consulted Wife, [name]           Patient will benefit from skilled therapeutic intervention in order to improve the following deficits and impairments:           Visit Diagnosis: Hemiplegia and hemiparesis following cerebral infarction affecting left dominant side ([place]) - Plan: Ot plan of care cert/re-cert  Muscle weakness (generalized) - Plan: Ot plan of care cert/re-cert  Other lack of coordination - Plan: Ot plan of care cert/re-cert  Unsteadiness on feet - Plan: Ot plan of care cert/re-cert  Apraxia - Plan: Ot plan of care cert/re-cert  Other disturbances of skin sensation - Plan: Ot plan of care cert/re-cert  Attention and concentration deficit - Plan: Ot plan of care cert/re-cert    Problem List Patient Active Problem List   Diagnosis Date Noted  . Abnormality of gait 09/24/2019  . AKI (acute kidney injury) ([place])   . Fever   . Sepsis without acute organ dysfunction ([place])   . Elevated BUN   . Blood pressure  increase diastolic   . Labile blood pressure   . Leukopenia   . Benign essential HTN   . Hypoalbuminemia   . Cerebrovascular accident (CVA) of right basal ganglia ([place]) 07/07/2019  . Acute ischemic stroke ([place])   . Dyslipidemia   . Dysphagia, post-stroke   . Polycythemia   . Stroke ([place]) 06/30/2019  . Prediabetes 12/23/2016  . Elevated LFTs 12/23/2016  . Pure hypercholesterolemia 12/21/2016  . Alcoholism ([place]) 12/21/2016  . Eczema 09/18/2013  . Essential hypertension 09/18/2013  . Gastroesophageal reflux disease without esophagitis 09/18/2013  . Tobacco abuse 09/18/2013    Mariah Milling, OTR/L 10/05/2019, 3:18 PM  [facility] [address] [place], [name], [address] Phone: [phone]   Fax:  [phone]  Name: Russell Russell MRN: [id number] Date of Birth: 1950/07/09

## 2019-10-05 NOTE — Therapy (Signed)
[facility] [address] [place], [name], [address] Phone: [phone]   Fax:  [phone]  Physical Therapy Treatment  Patient Details  Name: Russell Russell MRN: [id number] Date of Birth: 19-Aug-1950 Referring Provider (PT): Lauraine Rinne, PA-C   Encounter Date: 10/05/2019   PT End of Session - 10/05/19 1233    Visit Number 16    Number of Visits 31    Date for PT Re-Evaluation 12/30/19   new POC for 8 weeks, Cert for 90 days   Authorization Type [employer] (follow medicare guidelines, 10th visit PN)    Progress Note Due on Visit 20    PT Start Time 1234   Patient finishing up with Speech   PT Stop Time 1314    PT Time Calculation (min) 40 min    Equipment Utilized During Treatment Gait belt    Activity Tolerance Patient tolerated treatment well    Behavior During Therapy [facility] for tasks assessed/performed           Past Medical History:  Diagnosis Date  . Alcohol abuse    drinks 1 gallon of Gin every weekend, nothing during the week x >15 yrs  . Elevated transaminase level    +elev bili: abd u/s 06/2014 showed stable small hepatic hemangiomas, o/w normal.  . Essential hypertension   . GERD (gastroesophageal reflux disease)   . Hyperlipidemia 03/2014   Atorv started-chol improved  . Impaired fasting glucose 03/2014  . Nephrolithiasis   . PUD (peptic ulcer disease)   . Stroke ([place])   . Tobacco dependence    chantix: "psych effects"    Past Surgical History:  Procedure Laterality Date  . COLONOSCOPY  2005   Recall 10 yrs ([place])  . LOOP RECORDER INSERTION N/A 07/03/2019   Procedure: LOOP RECORDER INSERTION;  Surgeon: Thompson Grayer, MD;  Location: [facility];  Service: Cardiovascular;  Laterality: N/A;  . removal of kidney stones  2006   cystoscopic--removed from ureter.  No prob since.    There were no vitals filed for this visit.   Subjective Assessment - 10/05/19 1235    Subjective No new  complaints. No pain. Continues to walk in the home. No falls    Patient is accompained by: Family member    Pertinent History PMH: HTN, alcohol abuse (ongoing use), HLD, GERD, PUD, tobacco use    Limitations Standing;Walking;House hold activities    Patient Stated Goals return to prior level of function, walk, "wheelchair is not apart of me" (wants to get out of the w/c)    Currently in Pain? No/denies                             [facility] Adult PT Treatment/Exercise - 10/05/19 0001      Transfers   Transfers Sit to Stand;Stand to Sit    Sit to Stand 5: Supervision    Stand to Sit 5: Supervision    Stand Pivot Transfers 5: Supervision      Ambulation/Gait   Ambulation/Gait Yes    Ambulation/Gait Assistance 4: Min guard    Ambulation/Gait Assistance Details continued gait trianing with RW. PT providing verbal cues for improved step length, and verbal cues to look up for imporved posture with ambulation. Near end of walk PT providing Min A to LLE for foot palcement. With patient looking up into environment, patient demo decreased step length with LLE, requiring verbal/tactile cues from PT. Patietn  demo improved ability with turns as well with RW.     Ambulation Distance (Feet) 230 Feet   x 1, 115 ft x 1   Assistive device Rolling walker    Gait Pattern Step-through pattern;Step-to pattern;Decreased stride length;Decreased stance time - right;Decreased step length - left    Ambulation Surface Level;Indoor      Neuro Re-ed    Neuro Re-ed Details  Completed alternating marching in // bars with BUE support, working on slowed pace for improved weight shift to the LLE as well as improved hip strengthening on BLE. Patient demo decrease hip/knee flexion on LLE with fatigue.       Exercises   Exercises Knee/Hip;Other Exercises    Other Exercises  In // bars with BUE, completed side stepping x 3 laps, down and back with verbal cues for step length and avoiding dragging LLE. Patietn  demo weakness with LLE adductors with sidestepping to the R Side.       Knee/Hip Exercises: Standing   Forward Step Up Left;Hand Hold: 2;Step Height: 6";1 set;10 reps;Limitations    Forward Step Up Limitations completed in // bars with BUE support to 6" step. PT providing verbal cues for upright posture in standing position.                     PT Short Term Goals - 10/01/19 1251      PT SHORT TERM GOAL #1   Title Patient will demonstrate ability to ambulate >400 ft on indoor level surfaces with LRAD and CGA to demonstrate improved mobility    Baseline 200 ft, Min Guard to [place]    Time 4    Period Weeks    Status New    Target Date 10/29/19      PT SHORT TERM GOAL #2   Title Patient will demonstrate ability to ascend/descend 1 curb with [employer] and RW to demo improved entrance/exit of home    Baseline Min A    Time 4    Period Weeks    Status New    Target Date 10/29/19      PT SHORT TERM GOAL #3   Title --    Baseline --    Time --    Period --    Status --    Target Date --      PT SHORT TERM GOAL #4   Title --    Baseline --    Time --    Period --    Status --    Target Date --             PT Long Term Goals - 10/01/19 1237      PT LONG TERM GOAL #1   Title Patient will be independent with Final HEP, with wifes assistance (ALL LTG's Due: 11/26/2019)    Baseline Continue to progress HEP at this time    Time 8    Period Weeks    Status On-going    Target Date 11/26/19      PT LONG TERM GOAL #2   Title Patient will demonstrate ability to complete all transfers, Mod I, LRAD to demonstrate improved independence with functional mobility    Baseline CGA on 7/22    Time 8    Period Weeks    Status New    Target Date 11/26/19      PT LONG TERM GOAL #3   Title Patient will demo ability to ambulate >200 ft on  outdoor/unlevel surfaces to demo improved community mobility    Baseline not yet assessed    Time 8    Period Weeks    Status New     Target Date 11/26/19      PT LONG TERM GOAL #4   Title Patient will demonstrate ability to complete sit <> stand, Mod I, w/ LRAD to demonstrate improved functional mobility    Baseline Supv Level 7/22    Time 8    Period Weeks    Status Revised    Target Date 11/26/19      PT LONG TERM GOAL #5   Title Patient will demo ability to ascend/descend curb with LRAD and Supervision to demonstrate improved ability to enter/exit home    Baseline Min A    Time 8    Period Weeks    Status New    Target Date 11/26/19                 Plan - 10/05/19 1314    Clinical Impression Statement Today's skilled session focused on continued gait training, with patient able to ambulate improved distance of 230 ft with RW. Continued standing strengthening exercises focused on improved LLE strength and weight shift with patient tolerating well today.    Personal Factors and Comorbidities Comorbidity 3+    Comorbidities HTN, alcohol abuse (ongoing use), HLD, GERD, PUD, tobacco use    Examination-Activity Limitations Bed Mobility;Dressing;Locomotion Level;Stairs;Stand;Toileting;Transfers;Bathing    Examination-Participation Restrictions Driving;Interpersonal Relationship;Yard Work    [employer] Evolving/Moderate complexity    Rehab Potential Good    PT Frequency 2x / week    PT Duration 8 weeks    PT Treatment/Interventions ADLs/Self Care Home Management;Electrical Stimulation;DME Instruction;Moist Heat;Cryotherapy;Gait training;Stair training;Functional mobility training;Therapeutic activities;Therapeutic exercise;Balance training;Neuromuscular re-education;Patient/family education;Orthotic Fit/Training;Wheelchair mobility training;Passive range of motion    PT Next Visit Plan Update HEP, Continued gait training. NMR/weight shifting    Consulted and Agree with Plan of Care Patient;Family member/caregiver    Family Member Consulted Wife           Patient will benefit from  skilled therapeutic intervention in order to improve the following deficits and impairments:  Abnormal gait, Decreased coordination, Difficulty walking, Impaired tone, Decreased safety awareness, Decreased endurance, Decreased activity tolerance, Pain, Decreased balance, Decreased knowledge of use of DME, Postural dysfunction, Impaired sensation, Decreased strength, Decreased mobility  Visit Diagnosis: Hemiplegia and hemiparesis following cerebral infarction affecting left dominant side (HCC)  Muscle weakness (generalized)  Other lack of coordination  Unsteadiness on feet  Other abnormalities of gait and mobility     Problem List Patient Active Problem List   Diagnosis Date Noted  . Abnormality of gait 09/24/2019  . AKI (acute kidney injury) ([place])   . Fever   . Sepsis without acute organ dysfunction ([place])   . Elevated BUN   . Blood pressure increase diastolic   . Labile blood pressure   . Leukopenia   . Benign essential HTN   . Hypoalbuminemia   . Cerebrovascular accident (CVA) of right basal ganglia ([place]) 07/07/2019  . Acute ischemic stroke ([place])   . Dyslipidemia   . Dysphagia, post-stroke   . Polycythemia   . Stroke ([place]) 06/30/2019  . Prediabetes 12/23/2016  . Elevated LFTs 12/23/2016  . Pure hypercholesterolemia 12/21/2016  . Alcoholism ([place]) 12/21/2016  . Eczema 09/18/2013  . Essential hypertension 09/18/2013  . Gastroesophageal reflux disease without esophagitis 09/18/2013  . Tobacco abuse 09/18/2013    Jones Bales, PT, DPT 10/05/2019, 1:16  PM  [facility] [address] [place], [name], [address] Phone: [phone]   Fax:  [phone]  Name: Russell Russell MRN: [id number] Date of Birth: May 01, 1950

## 2019-10-07 ENCOUNTER — Encounter: Payer: Self-pay | Admitting: Occupational Therapy

## 2019-10-07 ENCOUNTER — Ambulatory Visit: Payer: BC Managed Care – PPO

## 2019-10-07 ENCOUNTER — Other Ambulatory Visit: Payer: Self-pay

## 2019-10-07 ENCOUNTER — Ambulatory Visit: Payer: BC Managed Care – PPO | Admitting: Occupational Therapy

## 2019-10-07 DIAGNOSIS — I69352 Hemiplegia and hemiparesis following cerebral infarction affecting left dominant side: Secondary | ICD-10-CM

## 2019-10-07 DIAGNOSIS — R2681 Unsteadiness on feet: Secondary | ICD-10-CM

## 2019-10-07 DIAGNOSIS — M6281 Muscle weakness (generalized): Secondary | ICD-10-CM

## 2019-10-07 DIAGNOSIS — R471 Dysarthria and anarthria: Secondary | ICD-10-CM

## 2019-10-07 DIAGNOSIS — R41840 Attention and concentration deficit: Secondary | ICD-10-CM

## 2019-10-07 DIAGNOSIS — R278 Other lack of coordination: Secondary | ICD-10-CM

## 2019-10-07 DIAGNOSIS — R41841 Cognitive communication deficit: Secondary | ICD-10-CM

## 2019-10-07 DIAGNOSIS — R2689 Other abnormalities of gait and mobility: Secondary | ICD-10-CM

## 2019-10-07 NOTE — Therapy (Signed)
[facility] [address] [place], [name], [address] Phone: [phone]   Fax:  [phone]  Speech Language Pathology Treatment  Patient Details  Name: Russell Russell MRN: [id number] Date of Birth: 11-21-1950 Referring Provider (SLP): Stann [place], [place]   Encounter Date: 10/07/2019   End of Session - 10/07/19 1134    Visit Number 14    Number of Visits 17    Date for SLP Re-Evaluation 11/05/19    SLP Start Time 6415    SLP Stop Time  1100    SLP Time Calculation (min) 42 min    Activity Tolerance Patient tolerated treatment well           Past Medical History:  Diagnosis Date  . Alcohol abuse    drinks 1 gallon of Gin every weekend, nothing during the week x >15 yrs  . Elevated transaminase level    +elev bili: abd u/s 06/2014 showed stable small hepatic hemangiomas, o/w normal.  . Essential hypertension   . GERD (gastroesophageal reflux disease)   . Hyperlipidemia 03/2014   Atorv started-chol improved  . Impaired fasting glucose 03/2014  . Nephrolithiasis   . PUD (peptic ulcer disease)   . Stroke ([place])   . Tobacco dependence    chantix: "psych effects"    Past Surgical History:  Procedure Laterality Date  . COLONOSCOPY  2005   Recall 10 yrs ([place])  . LOOP RECORDER INSERTION N/A 07/03/2019   Procedure: LOOP RECORDER INSERTION;  Surgeon: Thompson Grayer, MD;  Location: [facility];  Service: Cardiovascular;  Laterality: N/A;  . removal of kidney stones  2006   cystoscopic--removed from ureter.  No prob since.    There were no vitals filed for this visit.   Subjective Assessment - 10/07/19 1049    Subjective Pt reported he has been consistent with EMST, and his effort with expiratory device is 8/10.    Patient is accompained by: Family member   wife-wanda   Currently in Pain? No/denies                 ADULT SLP TREATMENT - 10/07/19 1050      General Information    Behavior/Cognition Alert;Decreased sustained attention;Cooperative;Pleasant mood;Requires cueing;Distractible      Treatment Provided   Treatment provided Cognitive-Linquistic      Cognitive-Linquistic Treatment   Treatment focused on Dysarthria;Cognition    Skilled Treatment Cognition (26 mintues): Pt indepenently tracked 25 reps of EMST. In simple calendar orientation tasks pt req'd mod cues initially - faded to extra time and min A rarely. Pt req'd cues for orientation to his memory notebook. sustained/selective attention for approx 4 1/2 minutes seen today.  (speech tx - individual) SLP directed pt to blow EMST device. Pt req'd cues for full breath prior to each blow, and iniital cues consistently to keep shoulders still. SLP told       Assessment / Recommendations / Plan   Plan Continue with current plan of care      Progression Toward Goals   Progression toward goals Not progressing toward goals (comment)   (cognitive goals)           SLP Education - 10/07/19 1133    Education Details wife may need to cue pt for breathing and/or keeping shoulders still prior to each rep    Person(s) Educated Patient;Spouse    Methods Explanation;Demonstration    Comprehension Verbalized understanding  SLP Short Term Goals - 10/01/19 1332      SLP SHORT TERM GOAL #1   Title pt will demo the knowledge that he can use a memory system for daily events, ID'ing prospective and past events, medication management, and appoitmetn tracking, etc x 3 sessions    Period --   or 9 total visits, for all STGs   Status Not Met      SLP SHORT TERM GOAL #2   Title pt will demo selective atteniton for 5 mintues to perform simple cognitive lingiustic tasks in a min-mod noisy environment x2 sessions    Status Partially Met      SLP SHORT TERM GOAL #3   Title pt will tell SLP 2 congitive deficits affecting his life at home post-CVA in two sessions    Status Deferred      SLP SHORT TERM GOAL #4    Title pt will incr expiratory muscle strength taken in first 2 ST sessions via spirometer    Baseline MEP 45 on 08/13/19    Time 1    Status Partially Met            SLP Long Term Goals - 10/07/19 1029      SLP LONG TERM GOAL #1   Title pt will demo simple conversation of 5 mintues with average mid 60s dB x 3 sessions    Time 1    Period Weeks   or 17 sessions   Status On-going      SLP LONG TERM GOAL #2   Title pt will demo selective atteniton for 7 mintues to perform simple cognitive lingiustic tasks in a min noisy environment x2 sessions    Time 1    Period Weeks    Status On-going      SLP LONG TERM GOAL #3   Title pt will demo WFL alternating attention in simple cognitve linguistic tasks with occasional min A in 3 sessions    Status Deferred      SLP LONG TERM GOAL #4   Title pt will independnently access memory notebook/planner for appoitnment management, to-do-lists, recall of prospective and past events, etc in 3 sessions    Time 1    Period Weeks    Status On-going            Plan - 10/07/19 1135    Clinical Impression Statement Pt presents today with  cognitive communication deficits which contiue to affect his ability to be independent and at their current level will require consistent assistance from wife/family for most needs. Pt req'd SLP mod assistance to access memory book to find appropriate sections.. Wife states pt's voice seems louder in the last 1-2 weeks but she still needs to ask him to repeat. Pt and wife are comfortable completing EMST and speech intelligibility tasks at home after d/c from [facility] in next 4-5 visits (until 17 total visits). SLP recommends skilled ST to address cognitive linguistics and dysarthria c/b reduced voice volume.    Speech Therapy Frequency 2x / week    Duration --   8 weeks or 17 total visits   Treatment/Interventions Environmental controls;Other (comment);Compensatory techniques;Functional tasks;SLP instruction and  feedback;Cueing hierarchy;Cognitive reorganization;Patient/family education;Internal/external aids   EMST   Potential to Achieve Goals Fair    Potential Considerations Severity of impairments;Ability to learn/carryover information    Consulted and Agree with Plan of Care Patient;Family member/caregiver    Family Member Consulted wife  Patient will benefit from skilled therapeutic intervention in order to improve the following deficits and impairments:   Dysarthria and anarthria  Cognitive communication deficit    Problem List Patient Active Problem List   Diagnosis Date Noted  . Abnormality of gait 09/24/2019  . AKI (acute kidney injury) ([place])   . Fever   . Sepsis without acute organ dysfunction ([place])   . Elevated BUN   . Blood pressure increase diastolic   . Labile blood pressure   . Leukopenia   . Benign essential HTN   . Hypoalbuminemia   . Cerebrovascular accident (CVA) of right basal ganglia ([place]) 07/07/2019  . Acute ischemic stroke ([place])   . Dyslipidemia   . Dysphagia, post-stroke   . Polycythemia   . Stroke ([place]) 06/30/2019  . Prediabetes 12/23/2016  . Elevated LFTs 12/23/2016  . Pure hypercholesterolemia 12/21/2016  . Alcoholism ([place]) 12/21/2016  . Eczema 09/18/2013  . Essential hypertension 09/18/2013  . Gastroesophageal reflux disease without esophagitis 09/18/2013  . Tobacco abuse 09/18/2013    [facility] ,[place], [place]  10/07/2019, 11:37 AM  [facility] [address] [place], [name], [address] Phone: [phone]   Fax:  [phone]   Name: Russell Russell MRN: [id number] Date of Birth: 11-Apr-1950

## 2019-10-07 NOTE — Patient Instructions (Signed)
Access Code: [facility] URL: [email or web address] Date: 10/07/2019 Prepared by: Baldomero Lamy  Exercises Seated Long Arc Quad - 1 x daily - 5 x weekly - 2 sets - 10 reps Seated March with Resistance - 1 x daily - 5 x weekly - 2 sets - 10 reps Seated Ankle Dorsiflexion with Resistance - 1 x daily - 5 x weekly - 2 sets - 10 reps Seated Hamstring Curl with Anchored Resistance - 1 x daily - 5 x weekly - 2 sets - 10 reps Supine Active Straight Leg Raise - 1 x daily - 5 x weekly - 2 sets - 10 reps Supine Heel Slides - 1 x daily - 7 x weekly - 2 sets - 10 reps Clamshell - 1 x daily - 5 x weekly - 2 sets - 10 reps Heel rises with counter support - 1 x daily - 5 x weekly - 2 sets - 10 reps Side Stepping with Counter Support - 1 x daily - 5 x weekly - 2 sets - 10 reps

## 2019-10-07 NOTE — Patient Instructions (Signed)
Russell Russell you may need to remind [name] to blow hard between the blows. You may need to remind him to keep his shoulders still too.

## 2019-10-07 NOTE — Therapy (Signed)
[facility] [address] [place], [name], [address] Phone: [phone]   Fax:  [phone]  Physical Therapy Treatment  Patient Details  Name: Russell Russell MRN: [id number] Date of Birth: 1950/12/17 Referring Provider (PT): Lauraine Rinne, PA-C   Encounter Date: 10/07/2019   PT End of Session - 10/07/19 1148    Visit Number 17    Number of Visits 31    Date for PT Re-Evaluation 12/30/19   new POC for 8 weeks, Cert for 90 days   Authorization Type BCBS (follow medicare guidelines, 10th visit PN)    Progress Note Due on Visit 20    PT Start Time 1145    PT Stop Time 1230    PT Time Calculation (min) 45 min    Equipment Utilized During Treatment Gait belt    Activity Tolerance Patient tolerated treatment well    Behavior During Therapy [facility] for tasks assessed/performed           Past Medical History:  Diagnosis Date  . Alcohol abuse    drinks 1 gallon of Gin every weekend, nothing during the week x >15 yrs  . Elevated transaminase level    +elev bili: abd u/s 06/2014 showed stable small hepatic hemangiomas, o/w normal.  . Essential hypertension   . GERD (gastroesophageal reflux disease)   . Hyperlipidemia 03/2014   Atorv started-chol improved  . Impaired fasting glucose 03/2014  . Nephrolithiasis   . PUD (peptic ulcer disease)   . Stroke ([place])   . Tobacco dependence    chantix: "psych effects"    Past Surgical History:  Procedure Laterality Date  . COLONOSCOPY  2005   Recall 10 yrs ([place])  . LOOP RECORDER INSERTION N/A 07/03/2019   Procedure: LOOP RECORDER INSERTION;  Surgeon: Thompson Grayer, MD;  Location: [facility];  Service: Cardiovascular;  Laterality: N/A;  . removal of kidney stones  2006   cystoscopic--removed from ureter.  No prob since.    There were no vitals filed for this visit.   Subjective Assessment - 10/07/19 1147    Subjective No new issues since last visit. No pain.      Patient is accompained by: Family member    Pertinent History PMH: HTN, alcohol abuse (ongoing use), HLD, GERD, PUD, tobacco use    Limitations Standing;Walking;House hold activities    Patient Stated Goals return to prior level of function, walk, "wheelchair is not apart of me" (wants to get out of the w/c)    Currently in Pain? No/denies                             [facility] Adult PT Treatment/Exercise - 10/07/19 0001      Transfers   Transfers Sit to Stand;Stand to Sit    Sit to Stand 5: Supervision    Stand to Sit 5: Supervision      Ambulation/Gait   Ambulation/Gait Yes    Ambulation/Gait Assistance 4: Min guard    Ambulation/Gait Assistance Details completed gait training around the gym with PT providing verbal cues for improved step length.     Ambulation Distance (Feet) 175 Feet    Assistive device Rolling walker    Gait Pattern Step-through pattern;Step-to pattern;Decreased stride length;Decreased stance time - right;Decreased step length - left    Ambulation Surface Level;Indoor      Exercises   Exercises Other Exercises    Other Exercises  Completed entire  review of current HEP and progressed as tolerated by patient.             Access Code: [facility] URL: [email or web address] Date: 10/07/2019 Prepared by: Baldomero Lamy  Exercises Seated Long Arc Quad - 1 x daily - 5 x weekly - 2 sets - 10 reps Seated March with Resistance - 1 x daily - 5 x weekly - 2 sets - 10 reps Seated Ankle Dorsiflexion with Resistance - 1 x daily - 5 x weekly - 2 sets - 10 reps Seated Hamstring Curl with Anchored Resistance - 1 x daily - 5 x weekly - 2 sets - 10 reps Supine Active Straight Leg Raise - 1 x daily - 5 x weekly - 2 sets - 10 reps - educated to complete smaller range with working on maintaining knee extension with completion.  Supine Heel Slides - 1 x daily - 7 x weekly - 2 sets - 10 reps Clamshell - 1 x daily - 5 x weekly - 2 sets - 10  reps Heel rises with counter support - 1 x daily - 5 x weekly - 2 sets - 10 reps Side Stepping with Counter Support - 1 x daily - 5 x weekly - 2 sets - 10 reps   All resistance exercises completed with yellow theraband. PT educating on wife to provide CGA and supervision throughout with standing exercises and to complete at countertop for safety.        PT Education - 10/07/19 1253    Education Details Educated on Update HEP    Person(s) Educated Patient;Spouse    Methods Explanation;Demonstration;Handout    Comprehension Verbalized understanding;Returned demonstration            PT Short Term Goals - 10/01/19 1251      PT SHORT TERM GOAL #1   Title Patient will demonstrate ability to ambulate >400 ft on indoor level surfaces with LRAD and CGA to demonstrate improved mobility    Baseline 200 ft, Min Guard to [place]    Time 4    Period Weeks    Status New    Target Date 10/29/19      PT SHORT TERM GOAL #2   Title Patient will demonstrate ability to ascend/descend 1 curb with [employer] and RW to demo improved entrance/exit of home    Baseline Min A    Time 4    Period Weeks    Status New    Target Date 10/29/19      PT SHORT TERM GOAL #3   Title --    Baseline --    Time --    Period --    Status --    Target Date --      PT SHORT TERM GOAL #4   Title --    Baseline --    Time --    Period --    Status --    Target Date --             PT Long Term Goals - 10/01/19 1237      PT LONG TERM GOAL #1   Title Patient will be independent with Final HEP, with wifes assistance (ALL LTG's Due: 11/26/2019)    Baseline Continue to progress HEP at this time    Time 8    Period Weeks    Status On-going    Target Date 11/26/19      PT LONG TERM GOAL #2   Title Patient will demonstrate ability  to complete all transfers, Mod I, LRAD to demonstrate improved independence with functional mobility    Baseline CGA on 7/22    Time 8    Period Weeks    Status New     Target Date 11/26/19      PT LONG TERM GOAL #3   Title Patient will demo ability to ambulate >200 ft on outdoor/unlevel surfaces to demo improved community mobility    Baseline not yet assessed    Time 8    Period Weeks    Status New    Target Date 11/26/19      PT LONG TERM GOAL #4   Title Patient will demonstrate ability to complete sit <> stand, Mod I, w/ LRAD to demonstrate improved functional mobility    Baseline Supv Level 7/22    Time 8    Period Weeks    Status Revised    Target Date 11/26/19      PT LONG TERM GOAL #5   Title Patient will demo ability to ascend/descend curb with LRAD and Supervision to demonstrate improved ability to enter/exit home    Baseline Min A    Time 8    Period Weeks    Status New    Target Date 11/26/19                 Plan - 10/07/19 1247    Clinical Impression Statement Today's skilled session included entire review of current HEP established and progressed/updated as tolerated. Patient able to tolerate additions of resistance (yellow theraband), and standing exercises. Patient will continue to benefit from skilled PT services to continue to progress toward goals.    Personal Factors and Comorbidities Comorbidity 3+    Comorbidities HTN, alcohol abuse (ongoing use), HLD, GERD, PUD, tobacco use    Examination-Activity Limitations Bed Mobility;Dressing;Locomotion Level;Stairs;Stand;Toileting;Transfers;Bathing    Examination-Participation Restrictions Driving;Interpersonal Relationship;Yard Work    [employer] Evolving/Moderate complexity    Rehab Potential Good    PT Frequency 2x / week    PT Duration 8 weeks    PT Treatment/Interventions ADLs/Self Care Home Management;Electrical Stimulation;DME Instruction;Moist Heat;Cryotherapy;Gait training;Stair training;Functional mobility training;Therapeutic activities;Therapeutic exercise;Balance training;Neuromuscular re-education;Patient/family education;Orthotic  Fit/Training;Wheelchair mobility training;Passive range of motion    PT Next Visit Plan Continued gait training. NMR/weight shifting    Consulted and Agree with Plan of Care Patient;Family member/caregiver    Family Member Consulted Wife           Patient will benefit from skilled therapeutic intervention in order to improve the following deficits and impairments:  Abnormal gait, Decreased coordination, Difficulty walking, Impaired tone, Decreased safety awareness, Decreased endurance, Decreased activity tolerance, Pain, Decreased balance, Decreased knowledge of use of DME, Postural dysfunction, Impaired sensation, Decreased strength, Decreased mobility  Visit Diagnosis: Hemiplegia and hemiparesis following cerebral infarction affecting left dominant side (HCC)  Muscle weakness (generalized)  Unsteadiness on feet  Other lack of coordination     Problem List Patient Active Problem List   Diagnosis Date Noted  . Abnormality of gait 09/24/2019  . AKI (acute kidney injury) ([place])   . Fever   . Sepsis without acute organ dysfunction ([place])   . Elevated BUN   . Blood pressure increase diastolic   . Labile blood pressure   . Leukopenia   . Benign essential HTN   . Hypoalbuminemia   . Cerebrovascular accident (CVA) of right basal ganglia ([place]) 07/07/2019  . Acute ischemic stroke ([place])   . Dyslipidemia   . Dysphagia, post-stroke   .  Polycythemia   . Stroke ([place]) 06/30/2019  . Prediabetes 12/23/2016  . Elevated LFTs 12/23/2016  . Pure hypercholesterolemia 12/21/2016  . Alcoholism ([place]) 12/21/2016  . Eczema 09/18/2013  . Essential hypertension 09/18/2013  . Gastroesophageal reflux disease without esophagitis 09/18/2013  . Tobacco abuse 09/18/2013    Russell Bales, PT, DPT 10/07/2019, 12:55 PM  [facility] [address] [place], [name], [address] Phone: [phone]   Fax:  [phone]  Name: Russell Russell MRN: [id number] Date of Birth: [birth year]

## 2019-10-07 NOTE — Therapy (Signed)
[facility] [address] [place], [name], [address] Phone: [phone]   Fax:  [phone]  Occupational Therapy Treatment  Patient Details  Name: Russell Russell MRN: [id number] Date of Birth: October 29, 1950 Referring Provider (OT): [name]   Encounter Date: 10/07/2019   OT End of Session - 10/07/19 1215    Visit Number 10    Number of Visits 25    Date for OT Re-Evaluation 12/04/19    Authorization Type BCBS? "Follow Medicare Guidelines"    Authorization - Number of Visits 10    Progress Note Due on Visit 54    OT Start Time 1105    OT Stop Time 1145    OT Time Calculation (min) 40 min    Activity Tolerance Patient tolerated treatment well    Behavior During Therapy [facility] for tasks assessed/performed           Past Medical History:  Diagnosis Date  . Alcohol abuse    drinks 1 gallon of Gin every weekend, nothing during the week x >15 yrs  . Elevated transaminase level    +elev bili: abd u/s 06/2014 showed stable small hepatic hemangiomas, o/w normal.  . Essential hypertension   . GERD (gastroesophageal reflux disease)   . Hyperlipidemia 03/2014   Atorv started-chol improved  . Impaired fasting glucose 03/2014  . Nephrolithiasis   . PUD (peptic ulcer disease)   . Stroke ([place])   . Tobacco dependence    chantix: "psych effects"    Past Surgical History:  Procedure Laterality Date  . COLONOSCOPY  2005   Recall 10 yrs ([place])  . LOOP RECORDER INSERTION N/A 07/03/2019   Procedure: LOOP RECORDER INSERTION;  Surgeon: Thompson Grayer, MD;  Location: [facility];  Service: Cardiovascular;  Laterality: N/A;  . removal of kidney stones  2006   cystoscopic--removed from ureter.  No prob since.    There were no vitals filed for this visit.   Subjective Assessment - 10/07/19 1106    Subjective  Denies pain    Patient is accompanied by: Family member    Pertinent History HTN, ETOH use, smoker, loop  recorder    Currently in Pain? No/denies                      Treatment: Ambulating with walker to practice opening drawers and closing a door, min A, and min-mod v.c for techniques. Standing at table to perform low range reaching with LUE to play connect 4 min difficulty, min v.c, minguard for balance. Seated placing perfection pieces in pegboard, mod - max difficulty and increased time. Placing graded clothespins on vertical antennae with LUE, min-mod difficulty/ v.c for low -midrange reach with LUE.              OT Short Term Goals - 10/05/19 1510      OT SHORT TERM GOAL #1   Title Patient will compelte a home exercise program designed to improve functional use of LUE with mod cueing and set up due 09/05/19    Status Achieved      OT SHORT TERM GOAL #2   Title Patient will don a front opening shirt with fasteners (buttons/zipper) with mod assist    Status Achieved      OT SHORT TERM GOAL #3   Title Patient will demonstrate ability to cut food on plate with min assist    Status Achieved      OT SHORT TERM GOAL #  4   Title Patient will demonstrate low reach, grasp, release of lightweight small object (less than 1 lb) with min assist    Status Achieved      OT SHORT TERM GOAL #5   Title Patient will ambulate into bathroom and cmplete toileting with supervision and verbal cueing assistance due 11/04/19    Time 4    Period Weeks    Status New    Target Date 11/04/19      Additional Short Term Goals   Additional Short Term Goals Yes      OT SHORT TERM GOAL #6   Title Patient will transport necessary items for bathing and dressing into bathroom to encourage greater independence with showering.    Time 4    Period Weeks    Status New             OT Long Term Goals - 10/05/19 1513      OT LONG TERM GOAL #1   Title Patient will complete an updated HEP to address LUE functioning due 10/05/19    Status On-going      OT LONG TERM GOAL #2   Title  Patient will walk with assistive device into bathroom to use toilet with min assist    Status Achieved      OT LONG TERM GOAL #3   Title Patient will transfer onto shower seat with min assist    Status Achieved      OT LONG TERM GOAL #4   Title Patient will obtain and release a 2-3 lb object at chest height shelf using LUE and min assist    Status Achieved      OT LONG TERM GOAL #5   Title Patient will toilet himself with modified independence due 12/04/19    Time 8    Period Weeks    Status New    Target Date 12/04/19      Long Term Additional Goals   Additional Long Term Goals Yes      OT LONG TERM GOAL #6   Title Patient will shower self with supervision/set up assistance    Time 8    Period Weeks    Status New      OT LONG TERM GOAL #7   Title Patient will grocery shop with wife, and will locate and retrieve at least 5 items from list with only minimal assistance    Time 8    Period Weeks    Status New                 Plan - 10/07/19 1225    Clinical Impression Statement Pt is progressing towards goals with improving functional mobility and functional reach.    Body Structure / Function / Physical Skills ADL;Coordination;Endurance;GMC;UE functional use;Sensation;Decreased knowledge of precautions;Balance;Body mechanics;Decreased knowledge of use of DME;Flexibility;IADL;Pain;Vision;Cardiopulmonary status limiting activity;Dexterity;FMC;Proprioception;Strength;Tone;ROM    Cognitive Skills Attention;Orientation;Thought;Perception;Problem Solve;Safety Awareness;Sequencing;Memory;Learn    Psychosocial Skills Habits;Routines and Behaviors    Rehab Potential Good    OT Frequency 2x / week    OT Duration 8 weeks    OT Treatment/Interventions Self-care/ADL training;Electrical Stimulation;Therapeutic exercise;Visual/perceptual remediation/compensation;Patient/family education;Splinting;Neuromuscular education;Moist Heat;Aquatic Therapy;Balance training;Therapeutic  activities;Functional Mobility Training;Fluidtherapy;DME and/or AE instruction;Manual Therapy;Cognitive remediation/compensation    Plan continue to address functional mobility and low range functional reach    Consulted and Agree with Plan of Care Patient;Family member/caregiver    Family Member Consulted Wife, Russell Russell           Patient will benefit from skilled  therapeutic intervention in order to improve the following deficits and impairments:   Body Structure / Function / Physical Skills: ADL, Coordination, Endurance, GMC, UE functional use, Sensation, Decreased knowledge of precautions, Balance, Body mechanics, Decreased knowledge of use of DME, Flexibility, IADL, Pain, Vision, Cardiopulmonary status limiting activity, Dexterity, FMC, Proprioception, Strength, Tone, ROM Cognitive Skills: Attention, Orientation, Thought, Perception, Problem Solve, Safety Awareness, Sequencing, Memory, Learn Psychosocial Skills: Habits, Routines and Behaviors   Visit Diagnosis: Hemiplegia and hemiparesis following cerebral infarction affecting left dominant side (HCC)  Muscle weakness (generalized)  Other lack of coordination  Unsteadiness on feet  Attention and concentration deficit  Other abnormalities of gait and mobility    Problem List Patient Active Problem List   Diagnosis Date Noted  . Abnormality of gait 09/24/2019  . AKI (acute kidney injury) ([place])   . Fever   . Sepsis without acute organ dysfunction ([place])   . Elevated BUN   . Blood pressure increase diastolic   . Labile blood pressure   . Leukopenia   . Benign essential HTN   . Hypoalbuminemia   . Cerebrovascular accident (CVA) of right basal ganglia ([place]) 07/07/2019  . Acute ischemic stroke ([place])   . Dyslipidemia   . Dysphagia, post-stroke   . Polycythemia   . Stroke ([place]) 06/30/2019  . Prediabetes 12/23/2016  . Elevated LFTs 12/23/2016  . Pure hypercholesterolemia 12/21/2016  . Alcoholism ([place]) 12/21/2016  . Eczema  09/18/2013  . Essential hypertension 09/18/2013  . Gastroesophageal reflux disease without esophagitis 09/18/2013  . Tobacco abuse 09/18/2013    Blaze Nylund 10/07/2019, 12:26 PM  [facility] [address] [place], [name], [address] Phone: [phone]   Fax:  [phone]  Name: Russell Russell MRN: [id number] Date of Birth: 11-Sep-1950

## 2019-10-12 ENCOUNTER — Ambulatory Visit (INDEPENDENT_AMBULATORY_CARE_PROVIDER_SITE_OTHER): Payer: BC Managed Care – PPO | Admitting: *Deleted

## 2019-10-12 DIAGNOSIS — I639 Cerebral infarction, unspecified: Secondary | ICD-10-CM | POA: Diagnosis not present

## 2019-10-12 LAB — CUP PACEART REMOTE DEVICE CHECK
Date Time Interrogation Session: 20210730230410
Implantable Pulse Generator Implant Date: 20210423

## 2019-10-13 ENCOUNTER — Ambulatory Visit: Payer: BC Managed Care – PPO | Admitting: Speech Pathology

## 2019-10-13 ENCOUNTER — Encounter: Payer: Self-pay | Admitting: Occupational Therapy

## 2019-10-13 ENCOUNTER — Ambulatory Visit: Payer: BC Managed Care – PPO | Admitting: Occupational Therapy

## 2019-10-13 ENCOUNTER — Other Ambulatory Visit: Payer: Self-pay

## 2019-10-13 ENCOUNTER — Ambulatory Visit: Payer: BC Managed Care – PPO | Attending: Physician Assistant

## 2019-10-13 DIAGNOSIS — R41841 Cognitive communication deficit: Secondary | ICD-10-CM | POA: Diagnosis present

## 2019-10-13 DIAGNOSIS — R482 Apraxia: Secondary | ICD-10-CM | POA: Diagnosis present

## 2019-10-13 DIAGNOSIS — R41840 Attention and concentration deficit: Secondary | ICD-10-CM

## 2019-10-13 DIAGNOSIS — R208 Other disturbances of skin sensation: Secondary | ICD-10-CM

## 2019-10-13 DIAGNOSIS — R471 Dysarthria and anarthria: Secondary | ICD-10-CM | POA: Diagnosis present

## 2019-10-13 DIAGNOSIS — R209 Unspecified disturbances of skin sensation: Secondary | ICD-10-CM | POA: Insufficient documentation

## 2019-10-13 DIAGNOSIS — M6281 Muscle weakness (generalized): Secondary | ICD-10-CM

## 2019-10-13 DIAGNOSIS — R278 Other lack of coordination: Secondary | ICD-10-CM | POA: Diagnosis present

## 2019-10-13 DIAGNOSIS — R2681 Unsteadiness on feet: Secondary | ICD-10-CM | POA: Diagnosis present

## 2019-10-13 DIAGNOSIS — R2689 Other abnormalities of gait and mobility: Secondary | ICD-10-CM | POA: Insufficient documentation

## 2019-10-13 DIAGNOSIS — I69352 Hemiplegia and hemiparesis following cerebral infarction affecting left dominant side: Secondary | ICD-10-CM

## 2019-10-13 NOTE — Therapy (Signed)
[facility] [address] [place], [name], [address] Phone: [phone]   Fax:  [phone]  Speech Language Pathology Treatment  Patient Details  Name: Russell Russell MRN: [id number] Date of Birth: 04/09/1950 Referring Provider (SLP): Stann Mainland, [place]   Encounter Date: 10/13/2019   End of Session - 10/13/19 1251    Visit Number 15    Number of Visits 17    Date for SLP Re-Evaluation 11/05/19    SLP Start Time 1884    SLP Stop Time  1227    SLP Time Calculation (min) 39 min    Activity Tolerance Patient tolerated treatment well           Past Medical History:  Diagnosis Date  . Alcohol abuse    drinks 1 gallon of Gin every weekend, nothing during the week x >15 yrs  . Elevated transaminase level    +elev bili: abd u/s 06/2014 showed stable small hepatic hemangiomas, o/w normal.  . Essential hypertension   . GERD (gastroesophageal reflux disease)   . Hyperlipidemia 03/2014   Atorv started-chol improved  . Impaired fasting glucose 03/2014  . Nephrolithiasis   . PUD (peptic ulcer disease)   . Stroke ([place])   . Tobacco dependence    chantix: "psych effects"    Past Surgical History:  Procedure Laterality Date  . COLONOSCOPY  2005   Recall 10 yrs ([place])  . LOOP RECORDER INSERTION N/A 07/03/2019   Procedure: LOOP RECORDER INSERTION;  Surgeon: Thompson Grayer, MD;  Location: [facility];  Service: Cardiovascular;  Laterality: N/A;  . removal of kidney stones  2006   cystoscopic--removed from ureter.  No prob since.    There were no vitals filed for this visit.          ADULT SLP TREATMENT - 10/13/19 1245      General Information   Behavior/Cognition Alert;Decreased sustained attention;Cooperative;Pleasant mood;Requires cueing;Distractible      Treatment Provided   Treatment provided Cognitive-Linquistic      Pain Assessment   Pain Assessment No/denies pain       Cognitive-Linquistic Treatment   Treatment focused on Dysarthria;Cognition    Skilled Treatment SLP targeted pt's dysarthria using EMST 150 device and structured speech and simple conversation tasks; cognition also targeted during these tasks by having pt track reps of EMST (mod cues for awareness of incorrect number of reps x1). Continues to require cues for full breath prior to each blow, occasional min A for keeping shoulders still. Pt read simple questions average 68 dB at 30 cm, required usual mod cues to respond at same level of effort/intensity. At end of session when SLP inquired about next appointment, pt opended his binder and told correct date/time without cues.       Assessment / Recommendations / Plan   Plan Continue with current plan of care      Progression Toward Goals   Progression toward goals Progressing toward goals              SLP Short Term Goals - 10/13/19 1253      SLP SHORT TERM GOAL #1   Title pt will demo the knowledge that he can use a memory system for daily events, ID'ing prospective and past events, medication management, and appoitmetn tracking, etc x 3 sessions    Period --   or 9 total visits, for all STGs   Status Not Met      SLP SHORT TERM GOAL #2  Title pt will demo selective atteniton for 5 mintues to perform simple cognitive lingiustic tasks in a min-mod noisy environment x2 sessions    Status Partially Met      SLP SHORT TERM GOAL #3   Title pt will tell SLP 2 congitive deficits affecting his life at home post-CVA in two sessions    Status Deferred      SLP SHORT TERM GOAL #4   Title pt will incr expiratory muscle strength taken in first 2 ST sessions via spirometer    Baseline MEP 45 on 08/13/19    Time 1    Status Partially Met            SLP Long Term Goals - 10/13/19 1253      SLP LONG TERM GOAL #1   Title pt will demo simple conversation of 5 mintues with average mid 60s dB x 3 sessions    Time 1    Period Weeks   or 17  sessions   Status On-going      SLP LONG TERM GOAL #2   Title pt will demo selective atteniton for 7 mintues to perform simple cognitive lingiustic tasks in a min noisy environment x2 sessions    Time 1    Period Weeks    Status On-going      SLP LONG TERM GOAL #3   Title pt will demo WFL alternating attention in simple cognitve linguistic tasks with occasional min A in 3 sessions    Status Deferred      SLP LONG TERM GOAL #4   Title pt will independnently access memory notebook/planner for appoitnment management, to-do-lists, recall of prospective and past events, etc in 3 sessions    Baseline 10/13/19    Time 1    Period Weeks    Status On-going            Plan - 10/13/19 1252    Clinical Impression Statement Pt presents today with  cognitive communication deficits which contiue to affect his ability to be independent and at their current level will require consistent assistance from wife/family for most needs. Pt req'd SLP mod assistance to access memory book to find appropriate sections.. Wife states pt's voice seems louder in the last 1-2 weeks but she still needs to ask him to repeat. Pt and wife are comfortable completing EMST and speech intelligibility tasks at home after d/c from [place] in next 2-3 visits (until 17 total visits). SLP recommends skilled ST to address cognitive linguistics and dysarthria c/b reduced voice volume.    Speech Therapy Frequency 2x / week    Duration --   8 weeks or 17 total visits   Treatment/Interventions Environmental controls;Other (comment);Compensatory techniques;Functional tasks;SLP instruction and feedback;Cueing hierarchy;Cognitive reorganization;Patient/family education;Internal/external aids   EMST   Potential to Achieve Goals Fair    Potential Considerations Severity of impairments;Ability to learn/carryover information    Consulted and Agree with Plan of Care Patient;Family member/caregiver    Family Member Consulted wife            Patient will benefit from skilled therapeutic intervention in order to improve the following deficits and impairments:   Dysarthria and anarthria  Cognitive communication deficit    Problem List Patient Active Problem List   Diagnosis Date Noted  . Abnormality of gait 09/24/2019  . AKI (acute kidney injury) ([place])   . Fever   . Sepsis without acute organ dysfunction ([place])   . Elevated BUN   . Blood pressure  increase diastolic   . Labile blood pressure   . Leukopenia   . Benign essential HTN   . Hypoalbuminemia   . Cerebrovascular accident (CVA) of right basal ganglia ([place]) 07/07/2019  . Acute ischemic stroke ([place])   . Dyslipidemia   . Dysphagia, post-stroke   . Polycythemia   . Stroke ([place]) 06/30/2019  . Prediabetes 12/23/2016  . Elevated LFTs 12/23/2016  . Pure hypercholesterolemia 12/21/2016  . Alcoholism ([place]) 12/21/2016  . Eczema 09/18/2013  . Essential hypertension 09/18/2013  . Gastroesophageal reflux disease without esophagitis 09/18/2013  . Tobacco abuse 09/18/2013   Deneise Lever, [place], [place] E Fronia Depass 10/13/2019, 12:54 PM  [facility] [address] [place], [name], [address] Phone: [phone]   Fax:  [phone]   Name: Archer Moist MRN: [id number] Date of Birth: 02/05/1951

## 2019-10-13 NOTE — Patient Instructions (Signed)
  Coordination Activities  Perform the following activities for 10 minutes2 Times per day with left hand(s).   Rotate ball in fingertips (clockwise and counter-clockwise).  Flip cards 1 at a time as fast as you can.  Pick up coins, buttons, marbles, dried beans/pasta of different sizes and place in container.  Pick up coins and stack.  Pick up coins one at a time until you get 5-10 in your hand, then move coins from palm to fingertips to stack one at a time.  Twirl pen between fingers.

## 2019-10-13 NOTE — Therapy (Signed)
[facility] [address] [place], [name], [address] Phone: [phone]   Fax:  [phone]  Occupational Therapy Treatment  Patient Details  Name: Russell Russell MRN: [id number] Date of Birth: 1951-01-20 Referring Provider (OT): [name]   Encounter Date: 10/13/2019   OT End of Session - 10/13/19 1610    Visit Number 11    Number of Visits 25    Date for OT Re-Evaluation 12/04/19    Authorization Type BCBS? "Follow Medicare Guidelines"    Progress Note Due on Visit 72    OT Start Time 1320    OT Stop Time 1400    OT Time Calculation (min) 40 min    Activity Tolerance Patient tolerated treatment well    Behavior During Therapy [facility] for tasks assessed/performed           Past Medical History:  Diagnosis Date  . Alcohol abuse    drinks 1 gallon of Gin every weekend, nothing during the week x >15 yrs  . Elevated transaminase level    +elev bili: abd u/s 06/2014 showed stable small hepatic hemangiomas, o/w normal.  . Essential hypertension   . GERD (gastroesophageal reflux disease)   . Hyperlipidemia 03/2014   Atorv started-chol improved  . Impaired fasting glucose 03/2014  . Nephrolithiasis   . PUD (peptic ulcer disease)   . Stroke ([place])   . Tobacco dependence    chantix: "psych effects"    Past Surgical History:  Procedure Laterality Date  . COLONOSCOPY  2005   Recall 10 yrs ([place])  . LOOP RECORDER INSERTION N/A 07/03/2019   Procedure: LOOP RECORDER INSERTION;  Surgeon: Thompson Grayer, MD;  Location: [facility];  Service: Cardiovascular;  Laterality: N/A;  . removal of kidney stones  2006   cystoscopic--removed from ureter.  No prob since.    There were no vitals filed for this visit.   Subjective Assessment - 10/13/19 1321    Subjective  It gets a little tricky (regarding moving around with walker in kitchen setting)    Patient is accompanied by: Family member    Pertinent History  HTN, ETOH use, smoker, loop recorder    Currently in Pain? No/denies    Pain Score 0-No pain              [facility] OT Assessment - 10/13/19 0001      Coordination   Left 9 Hole Peg Test 1:41:97    Box and Blocks 14 LUE                    OT Treatments/Exercises (OP) - 10/13/19 1603      Neurological Re-education Exercises   Other Exercises 1 Neuromuscular reeducation to address wrist range of motion, carpal mobility.  Patient still wearing supportive wrist brace when walking with walker,  Weight bearing through left side in sidelying to side sit, then up to long arm weight bearing.  Patient reports improved functional use of LUE during ADL's.  Repeated box and blocks with signifcant improvement, patient also now able to complete 9 hole peg test.      Other Exercises 2 Initiated coordiantion exercises for LUE - see patient instructions                  OT Education - 10/13/19 1609    Education Details coordination HEP    Person(s) Educated Patient;Spouse    Methods Explanation;Demonstration;Tactile cues;Verbal cues;Handout    Comprehension Verbalized understanding;Returned demonstration;Need  further instruction            OT Short Term Goals - 10/05/19 1510      OT SHORT TERM GOAL #1   Title Patient will compelte a home exercise program designed to improve functional use of LUE with mod cueing and set up due 09/05/19    Status Achieved      OT SHORT TERM GOAL #2   Title Patient will don a front opening shirt with fasteners (buttons/zipper) with mod assist    Status Achieved      OT SHORT TERM GOAL #3   Title Patient will demonstrate ability to cut food on plate with min assist    Status Achieved      OT SHORT TERM GOAL #4   Title Patient will demonstrate low reach, grasp, release of lightweight small object (less than 1 lb) with min assist    Status Achieved      OT SHORT TERM GOAL #5   Title Patient will ambulate into bathroom and cmplete toileting  with supervision and verbal cueing assistance due 11/04/19    Time 4    Period Weeks    Status New    Target Date 11/04/19      Additional Short Term Goals   Additional Short Term Goals Yes      OT SHORT TERM GOAL #6   Title Patient will transport necessary items for bathing and dressing into bathroom to encourage greater independence with showering.    Time 4    Period Weeks    Status New             OT Long Term Goals - 10/05/19 1513      OT LONG TERM GOAL #1   Title Patient will complete an updated HEP to address LUE functioning due 10/05/19    Status On-going      OT LONG TERM GOAL #2   Title Patient will walk with assistive device into bathroom to use toilet with min assist    Status Achieved      OT LONG TERM GOAL #3   Title Patient will transfer onto shower seat with min assist    Status Achieved      OT LONG TERM GOAL #4   Title Patient will obtain and release a 2-3 lb object at chest height shelf using LUE and min assist    Status Achieved      OT LONG TERM GOAL #5   Title Patient will toilet himself with modified independence due 12/04/19    Time 8    Period Weeks    Status New    Target Date 12/04/19      Long Term Additional Goals   Additional Long Term Goals Yes      OT LONG TERM GOAL #6   Title Patient will shower self with supervision/set up assistance    Time 8    Period Weeks    Status New      OT LONG TERM GOAL #7   Title Patient will grocery shop with wife, and will locate and retrieve at least 5 items from list with only minimal assistance    Time 8    Period Weeks    Status New                 Plan - 10/13/19 1611    Clinical Impression Statement Patient with steady improvement to functional mobility.  Patient continues with motor planning difficulties.  Working to  encourage forced use of LUE in functional activities.    OT Frequency 2x / week    OT Duration 8 weeks    OT Treatment/Interventions Self-care/ADL  training;Electrical Stimulation;Therapeutic exercise;Visual/perceptual remediation/compensation;Patient/family education;Splinting;Neuromuscular education;Moist Heat;Aquatic Therapy;Balance training;Therapeutic activities;Functional Mobility Training;Fluidtherapy;DME and/or AE instruction;Manual Therapy;Cognitive remediation/compensation    Plan continue to address functional mobility and mid to high range functional reach    OT Home Exercise Plan coordination HEP    Consulted and Agree with Plan of Care Patient;Family member/caregiver    Family Member Consulted Wife, [name]           Patient will benefit from skilled therapeutic intervention in order to improve the following deficits and impairments:           Visit Diagnosis: Other lack of coordination  Attention and concentration deficit  Apraxia  Other disturbances of skin sensation  Muscle weakness (generalized)  Hemiplegia and hemiparesis following cerebral infarction affecting left dominant side ([place])  Unsteadiness on feet    Problem List Patient Active Problem List   Diagnosis Date Noted  . Abnormality of gait 09/24/2019  . AKI (acute kidney injury) ([place])   . Fever   . Sepsis without acute organ dysfunction ([place])   . Elevated BUN   . Blood pressure increase diastolic   . Labile blood pressure   . Leukopenia   . Benign essential HTN   . Hypoalbuminemia   . Cerebrovascular accident (CVA) of right basal ganglia ([place]) 07/07/2019  . Acute ischemic stroke ([place])   . Dyslipidemia   . Dysphagia, post-stroke   . Polycythemia   . Stroke ([place]) 06/30/2019  . Prediabetes 12/23/2016  . Elevated LFTs 12/23/2016  . Pure hypercholesterolemia 12/21/2016  . Alcoholism ([place]) 12/21/2016  . Eczema 09/18/2013  . Essential hypertension 09/18/2013  . Gastroesophageal reflux disease without esophagitis 09/18/2013  . Tobacco abuse 09/18/2013    Mariah Milling, OTR/L 10/13/2019, 4:13 PM  [facility] [address] [place], [name], [address] Phone: [phone]   Fax:  [phone]  Name: Russell Russell MRN: [id number] Date of Birth: [birth year]

## 2019-10-13 NOTE — Therapy (Signed)
[facility] [address] [place], [name], [address] Phone: [phone]   Fax:  [phone]  Physical Therapy Treatment  Patient Details  Name: Russell Russell MRN: [id number] Date of Birth: 01/17/1951 Referring Provider (PT): Lauraine Rinne, PA-C   Encounter Date: 10/13/2019   PT End of Session - 10/13/19 1236    Visit Number 18    Number of Visits 31    Date for PT Re-Evaluation 12/30/19   new POC for 8 weeks, Cert for 90 days   Authorization Type BCBS (follow medicare guidelines, 10th visit PN)    Progress Note Due on Visit 20    PT Start Time 1232    PT Stop Time 1314    PT Time Calculation (min) 42 min    Equipment Utilized During Treatment Gait belt    Activity Tolerance Patient tolerated treatment well    Behavior During Therapy WFL for tasks assessed/performed           Past Medical History:  Diagnosis Date  . Alcohol abuse    drinks 1 gallon of Gin every weekend, nothing during the week x >15 yrs  . Elevated transaminase level    +elev bili: abd u/s 06/2014 showed stable small hepatic hemangiomas, o/w normal.  . Essential hypertension   . GERD (gastroesophageal reflux disease)   . Hyperlipidemia 03/2014   Atorv started-chol improved  . Impaired fasting glucose 03/2014  . Nephrolithiasis   . PUD (peptic ulcer disease)   . Stroke ([place])   . Tobacco dependence    chantix: "psych effects"    Past Surgical History:  Procedure Laterality Date  . COLONOSCOPY  2005   Recall 10 yrs ([place])  . LOOP RECORDER INSERTION N/A 07/03/2019   Procedure: LOOP RECORDER INSERTION;  Surgeon: Thompson Grayer, MD;  Location: [facility];  Service: Cardiovascular;  Laterality: N/A;  . removal of kidney stones  2006   cystoscopic--removed from ureter.  No prob since.    There were no vitals filed for this visit.   Subjective Assessment - 10/13/19 1235    Subjective Patient reports no new issues/complaints.  Reports new additions HEP went well.    Patient is accompained by: Family member    Pertinent History PMH: HTN, alcohol abuse (ongoing use), HLD, GERD, PUD, tobacco use    Limitations Standing;Walking;House hold activities    Patient Stated Goals return to prior level of function, walk, "wheelchair is not apart of me" (wants to get out of the w/c)    Currently in Pain? No/denies                             [facility] Adult PT Treatment/Exercise - 10/13/19 0001      Transfers   Transfers Sit to Stand;Stand to Sit    Sit to Stand 5: Supervision    Sit to Stand Details Manual facilitation for weight shifting    Stand to Sit 5: Supervision    Stand to Sit Details (indicate cue type and reason) Manual facilitation for weight shifting    Comments completed sit <> stand training x 10 reps with 2 inch block under RLE to promote weight shift to LLE, PT also providing manual faciliation at pelvis. Mirror used for Warden/ranger.      Ambulation/Gait   Ambulation/Gait Yes    Ambulation/Gait Assistance 4: Min guard;4: Min assist    Ambulation/Gait Assistance Details completed gait training with RW  and PT providing CGA intiially, patient still demo high reliance on vision with ambulation. With looking up with ambulation, patient demo increased difficulty with bringing LLE through with gait. PT providing intermittent Min A to LLE when scanning environment with gait    Ambulation Distance (Feet) 230 Feet   x 2    Assistive device Rolling walker    Gait Pattern Step-through pattern;Step-to pattern;Decreased stride length;Decreased stance time - right;Decreased step length - left    Ambulation Surface Level;Indoor    Curb 4: Min assist    Curb Details (indicate cue type and reason) completed ascending/descending curb with RW x 2 reps. PT providing verbal cues for proper sequencing. Min A required during completion. Patient demo most difficulty with turning and alignment of RW to curb,     Gait Comments PT providing verbal cues for continued turns and alignment of RW and self prior to descent to sitting position      Neuro Re-ed    Neuro Re-ed Details  Completed static standing balance on airex pad with wide BOS and without UE support 3 x 1 min each. Progressed to completing alternating shoulder flexion with BUE to further challenege balance while standing on airex pad, completed 1 x 5 reps with BUE. Intermittent CGA required, increased posterior sway noted with completion.                     PT Short Term Goals - 10/01/19 1251      PT SHORT TERM GOAL #1   Title Patient will demonstrate ability to ambulate >400 ft on indoor level surfaces with LRAD and CGA to demonstrate improved mobility    Baseline 200 ft, Min Guard to [place]    Time 4    Period Weeks    Status New    Target Date 10/29/19      PT SHORT TERM GOAL #2   Title Patient will demonstrate ability to ascend/descend 1 curb with [employer] and RW to demo improved entrance/exit of home    Baseline Min A    Time 4    Period Weeks    Status New    Target Date 10/29/19      PT SHORT TERM GOAL #3   Title --    Baseline --    Time --    Period --    Status --    Target Date --      PT SHORT TERM GOAL #4   Title --    Baseline --    Time --    Period --    Status --    Target Date --             PT Long Term Goals - 10/01/19 1237      PT LONG TERM GOAL #1   Title Patient will be independent with Final HEP, with wifes assistance (ALL LTG's Due: 11/26/2019)    Baseline Continue to progress HEP at this time    Time 8    Period Weeks    Status On-going    Target Date 11/26/19      PT LONG TERM GOAL #2   Title Patient will demonstrate ability to complete all transfers, Mod I, LRAD to demonstrate improved independence with functional mobility    Baseline CGA on 7/22    Time 8    Period Weeks    Status New    Target Date 11/26/19      PT LONG TERM  GOAL #3   Title Patient will demo  ability to ambulate >200 ft on outdoor/unlevel surfaces to demo improved community mobility    Baseline not yet assessed    Time 8    Period Weeks    Status New    Target Date 11/26/19      PT LONG TERM GOAL #4   Title Patient will demonstrate ability to complete sit <> stand, Mod I, w/ LRAD to demonstrate improved functional mobility    Baseline Supv Level 7/22    Time 8    Period Weeks    Status Revised    Target Date 11/26/19      PT LONG TERM GOAL #5   Title Patient will demo ability to ascend/descend curb with LRAD and Supervision to demonstrate improved ability to enter/exit home    Baseline Min A    Time 8    Period Weeks    Status New    Target Date 11/26/19                 Plan - 10/13/19 1349    Clinical Impression Statement Today's skilled session included continued gait training with focus on improved ambulation distances as well as improved posture and scanning environment with ambulation. Patietn still demo difficulty with turns with RW at this time. Initiated trainign of ascending/descending curb with RW with patient tolerating well. Patient will continue to benefit from skilled PT services to progress toward all goals.    Personal Factors and Comorbidities Comorbidity 3+    Comorbidities HTN, alcohol abuse (ongoing use), HLD, GERD, PUD, tobacco use    Examination-Activity Limitations Bed Mobility;Dressing;Locomotion Level;Stairs;Stand;Toileting;Transfers;Bathing    Examination-Participation Restrictions Driving;Interpersonal Relationship;Yard Work    Merchant [employer] officer Evolving/Moderate complexity    Rehab Potential Good    PT Frequency 2x / week    PT Duration 8 weeks    PT Treatment/Interventions ADLs/Self Care Home Management;Electrical Stimulation;DME Instruction;Moist Heat;Cryotherapy;Gait training;Stair training;Functional mobility training;Therapeutic activities;Therapeutic exercise;Balance training;Neuromuscular  re-education;Patient/family education;Orthotic Fit/Training;Wheelchair mobility training;Passive range of motion    PT Next Visit Plan Continued gait training. NMR/weight shifting; curb training: static standing balance exercises    Consulted and Agree with Plan of Care Patient;Family member/caregiver    Family Member Consulted Wife           Patient will benefit from skilled therapeutic intervention in order to improve the following deficits and impairments:  Abnormal gait, Decreased coordination, Difficulty walking, Impaired tone, Decreased safety awareness, Decreased endurance, Decreased activity tolerance, Pain, Decreased balance, Decreased knowledge of use of DME, Postural dysfunction, Impaired sensation, Decreased strength, Decreased mobility  Visit Diagnosis: Unsteadiness on feet  Hemiplegia and hemiparesis following cerebral infarction affecting left dominant side (HCC)  Muscle weakness (generalized)  Other lack of coordination     Problem List Patient Active Problem List   Diagnosis Date Noted  . Abnormality of gait 09/24/2019  . AKI (acute kidney injury) ([place])   . Fever   . Sepsis without acute organ dysfunction ([place])   . Elevated BUN   . Blood pressure increase diastolic   . Labile blood pressure   . Leukopenia   . Benign essential HTN   . Hypoalbuminemia   . Cerebrovascular accident (CVA) of right basal ganglia ([place]) 07/07/2019  . Acute ischemic stroke ([place])   . Dyslipidemia   . Dysphagia, post-stroke   . Polycythemia   . Stroke ([place]) 06/30/2019  . Prediabetes 12/23/2016  . Elevated LFTs 12/23/2016  . Pure hypercholesterolemia 12/21/2016  . Alcoholism ([place])  12/21/2016  . Eczema 09/18/2013  . Essential hypertension 09/18/2013  . Gastroesophageal reflux disease without esophagitis 09/18/2013  . Tobacco abuse 09/18/2013    Jones Bales, PT, DPT 10/13/2019, 1:52 PM  [facility] [address] [place], [name], [address] Phone: [phone]   Fax:  [phone]  Name: Russell Russell MRN: [id number] Date of Birth: 14-Feb-1951

## 2019-10-15 NOTE — Progress Notes (Signed)
[facility]link Summary Report / Loop Recorder 

## 2019-10-16 ENCOUNTER — Other Ambulatory Visit: Payer: Self-pay

## 2019-10-16 ENCOUNTER — Ambulatory Visit: Payer: BC Managed Care – PPO

## 2019-10-16 DIAGNOSIS — M6281 Muscle weakness (generalized): Secondary | ICD-10-CM

## 2019-10-16 DIAGNOSIS — R2681 Unsteadiness on feet: Secondary | ICD-10-CM

## 2019-10-16 DIAGNOSIS — R2689 Other abnormalities of gait and mobility: Secondary | ICD-10-CM

## 2019-10-16 DIAGNOSIS — R41841 Cognitive communication deficit: Secondary | ICD-10-CM

## 2019-10-16 DIAGNOSIS — R471 Dysarthria and anarthria: Secondary | ICD-10-CM

## 2019-10-16 DIAGNOSIS — R278 Other lack of coordination: Secondary | ICD-10-CM

## 2019-10-16 DIAGNOSIS — I69352 Hemiplegia and hemiparesis following cerebral infarction affecting left dominant side: Secondary | ICD-10-CM

## 2019-10-16 NOTE — Therapy (Signed)
[facility] [address] [place], [name], [address] Phone: [phone]   Fax:  [phone]  Physical Therapy Treatment  Patient Details  Name: Russell Russell MRN: [id number] Date of Birth: 1950/08/16 Referring Provider (PT): Lauraine Rinne, PA-C   Encounter Date: 10/16/2019   PT End of Session - 10/16/19 1321    Visit Number 19    Number of Visits 31    Date for PT Re-Evaluation 12/30/19   new POC for 8 weeks, Cert for 90 days   Authorization Type BCBS (follow medicare guidelines, 10th visit PN)    Progress Note Due on Visit 20    PT Start Time 1316    PT Stop Time 1359    PT Time Calculation (min) 43 min    Equipment Utilized During Treatment Gait belt    Activity Tolerance Patient tolerated treatment well    Behavior During Therapy WFL for tasks assessed/performed           Past Medical History:  Diagnosis Date  . Alcohol abuse    drinks 1 gallon of Gin every weekend, nothing during the week x >15 yrs  . Elevated transaminase level    +elev bili: abd u/s 06/2014 showed stable small hepatic hemangiomas, o/w normal.  . Essential hypertension   . GERD (gastroesophageal reflux disease)   . Hyperlipidemia 03/2014   Atorv started-chol improved  . Impaired fasting glucose 03/2014  . Nephrolithiasis   . PUD (peptic ulcer disease)   . Stroke ([place])   . Tobacco dependence    chantix: "psych effects"    Past Surgical History:  Procedure Laterality Date  . COLONOSCOPY  2005   Recall 10 yrs ([place])  . LOOP RECORDER INSERTION N/A 07/03/2019   Procedure: LOOP RECORDER INSERTION;  Surgeon: Thompson Grayer, MD;  Location: [facility];  Service: Cardiovascular;  Laterality: N/A;  . removal of kidney stones  2006   cystoscopic--removed from ureter.  No prob since.    There were no vitals filed for this visit.   Subjective Assessment - 10/16/19 1320    Subjective Patient reports no concerns/complaints. No  pain. No falls.    Patient is accompained by: Family member    Pertinent History PMH: HTN, alcohol abuse (ongoing use), HLD, GERD, PUD, tobacco use    Limitations Standing;Walking;House hold activities    Patient Stated Goals return to prior level of function, walk, "wheelchair is not apart of me" (wants to get out of the w/c)    Currently in Pain? No/denies                             [facility] Adult PT Treatment/Exercise - 10/16/19 0001      Ambulation/Gait   Ambulation/Gait Yes    Ambulation/Gait Assistance 4: Min guard;4: Min assist    Ambulation/Gait Assistance Details Completed gait training with RW, patietn demo increased difficulty maintaining alignment within the RW. PT providing verbal cues for improved turns, step length, and manual faicliation for weight shift at pelvis.     Ambulation Distance (Feet) 115 Feet   x 2   Assistive device Rolling walker    Gait Pattern Step-through pattern;Step-to pattern;Decreased stride length;Decreased stance time - right;Decreased step length - left    Ambulation Surface Level;Indoor    Curb 4: Min assist    Curb Details (indicate cue type and reason) completed ascending/descending curb with RW, completed x 3 reps. PT continues to  provide verbal cues for sequencing for proper ascending/descending with an AD. Min A required for completion of stepping up/down. Patient demo overall good balance with lifting and placing RW at this time.     Gait Comments Completed gait training with focus on negotiating around cones including slight turns to bilateral directions. Overall patient demo increased difficulty with motor planning with dual tasking and often leaves LLE behind. PT providing verbal cues for improved sequencing and step length with completion of turns. Completed 3 x 3 cones. Intermitten rest breaks erquired due to fatigue.                     PT Short Term Goals - 10/01/19 1251      PT SHORT TERM GOAL #1   Title  Patient will demonstrate ability to ambulate >400 ft on indoor level surfaces with LRAD and CGA to demonstrate improved mobility    Baseline 200 ft, Min Guard to [place]    Time 4    Period Weeks    Status New    Target Date 10/29/19      PT SHORT TERM GOAL #2   Title Patient will demonstrate ability to ascend/descend 1 curb with [employer] and RW to demo improved entrance/exit of home    Baseline Min A    Time 4    Period Weeks    Status New    Target Date 10/29/19      PT SHORT TERM GOAL #3   Title --    Baseline --    Time --    Period --    Status --    Target Date --      PT SHORT TERM GOAL #4   Title --    Baseline --    Time --    Period --    Status --    Target Date --             PT Long Term Goals - 10/01/19 1237      PT LONG TERM GOAL #1   Title Patient will be independent with Final HEP, with wifes assistance (ALL LTG's Due: 11/26/2019)    Baseline Continue to progress HEP at this time    Time 8    Period Weeks    Status On-going    Target Date 11/26/19      PT LONG TERM GOAL #2   Title Patient will demonstrate ability to complete all transfers, Mod I, LRAD to demonstrate improved independence with functional mobility    Baseline CGA on 7/22    Time 8    Period Weeks    Status New    Target Date 11/26/19      PT LONG TERM GOAL #3   Title Patient will demo ability to ambulate >200 ft on outdoor/unlevel surfaces to demo improved community mobility    Baseline not yet assessed    Time 8    Period Weeks    Status New    Target Date 11/26/19      PT LONG TERM GOAL #4   Title Patient will demonstrate ability to complete sit <> stand, Mod I, w/ LRAD to demonstrate improved functional mobility    Baseline Supv Level 7/22    Time 8    Period Weeks    Status Revised    Target Date 11/26/19      PT LONG TERM GOAL #5   Title Patient will demo ability to ascend/descend curb  with LRAD and Supervision to demonstrate improved ability to enter/exit home     Baseline Min A    Time 8    Period Weeks    Status New    Target Date 11/26/19                 Plan - 10/16/19 1358    Clinical Impression Statement Today's skilled PT session including extensive gait training including work on negotiating around obstacles, due to patient dem odifficulty with turns at this time. Continued practice of ascending/descending curb with RW. patient will continue to benefit from skilled PT services to progress toward all goals.    Personal Factors and Comorbidities Comorbidity 3+    Comorbidities HTN, alcohol abuse (ongoing use), HLD, GERD, PUD, tobacco use    Examination-Activity Limitations Bed Mobility;Dressing;Locomotion Level;Stairs;Stand;Toileting;Transfers;Bathing    Examination-Participation Restrictions Driving;Interpersonal Relationship;Yard Work    Merchant navy officer Evolving/Moderate complexity    Rehab Potential Good    PT Frequency 2x / week    PT Duration 8 weeks    PT Treatment/Interventions ADLs/Self Care Home Management;Electrical Stimulation;DME Instruction;Moist Heat;Cryotherapy;Gait training;Stair training;Functional mobility training;Therapeutic activities;Therapeutic exercise;Balance training;Neuromuscular re-education;Patient/family education;Orthotic Fit/Training;Wheelchair mobility training;Passive range of motion    PT Next Visit Plan Continued gait training. NMR/weight shifting; curb training: static standing balance exercises    Consulted and Agree with Plan of Care Patient;Family member/caregiver    Family Member Consulted Wife           Patient will benefit from skilled therapeutic intervention in order to improve the following deficits and impairments:  Abnormal gait, Decreased coordination, Difficulty walking, Impaired tone, Decreased safety awareness, Decreased endurance, Decreased activity tolerance, Pain, Decreased balance, Decreased knowledge of use of DME, Postural dysfunction, Impaired sensation,  Decreased strength, Decreased mobility  Visit Diagnosis: Muscle weakness (generalized)  Hemiplegia and hemiparesis following cerebral infarction affecting left dominant side (HCC)  Unsteadiness on feet  Other abnormalities of gait and mobility  Other lack of coordination     Problem List Patient Active Problem List   Diagnosis Date Noted  . Abnormality of gait 09/24/2019  . AKI (acute kidney injury) ([place])   . Fever   . Sepsis without acute organ dysfunction ([place])   . Elevated BUN   . Blood pressure increase diastolic   . Labile blood pressure   . Leukopenia   . Benign essential HTN   . Hypoalbuminemia   . Cerebrovascular accident (CVA) of right basal ganglia ([place]) 07/07/2019  . Acute ischemic stroke ([place])   . Dyslipidemia   . Dysphagia, post-stroke   . Polycythemia   . Stroke ([place]) 06/30/2019  . Prediabetes 12/23/2016  . Elevated LFTs 12/23/2016  . Pure hypercholesterolemia 12/21/2016  . Alcoholism ([place]) 12/21/2016  . Eczema 09/18/2013  . Essential hypertension 09/18/2013  . Gastroesophageal reflux disease without esophagitis 09/18/2013  . Tobacco abuse 09/18/2013    Jones Bales, PT, DPT 10/16/2019, 2:01 PM  [facility] [address] [place], [name], [address] Phone: [phone]   Fax:  [phone]  Name: Fox Salminen MRN: [id number] Date of Birth: Jun 29, 1950

## 2019-10-17 NOTE — Therapy (Signed)
[facility] [address] [place], [place], [address] Phone: [phone]   Fax:  [phone]  Speech Language Pathology Treatment  Patient Details  Name: Russell Russell MRN: [id number] Date of Birth: 11/27/1950 Referring Provider (SLP): Angiuli, Daniel, PA   Encounter Date: 10/16/2019   End of Session - 10/17/19 0041    Visit Number 16    Number of Visits 17    Date for SLP Re-Evaluation 11/05/19    SLP Start Time 1406    SLP Stop Time  1447    SLP Time Calculation (min) 41 min    Activity Tolerance Patient tolerated treatment well           Past Medical History:  Diagnosis Date  . Alcohol abuse    drinks 1 gallon of Gin every weekend, nothing during the week x >15 yrs  . Elevated transaminase level    +elev bili: abd u/s 06/2014 showed stable small hepatic hemangiomas, o/w normal.  . Essential hypertension   . GERD (gastroesophageal reflux disease)   . Hyperlipidemia 03/2014   Atorv started-chol improved  . Impaired fasting glucose 03/2014  . Nephrolithiasis   . PUD (peptic ulcer disease)   . Stroke (HCC)   . Tobacco dependence    chantix: "psych effects"    Past Surgical History:  Procedure Laterality Date  . COLONOSCOPY  2005   Recall 10 yrs ([place])  . LOOP RECORDER INSERTION N/A 07/03/2019   Procedure: LOOP RECORDER INSERTION;  Surgeon: Allred, James, MD;  Location: [facility];  Service: Cardiovascular;  Laterality: N/A;  . removal of kidney stones  2006   cystoscopic--removed from ureter.  No prob since.    There were no vitals filed for this visit.   Subjective Assessment - 10/17/19 0040    Subjective Pt reported he has been consistent with EMST, and his effort with expiratory device is 8/10 with varied success with appropriate sound. Wife corroborates pt's description.    Patient is accompained by: Family member   wife-wanda   Currently in Pain? No/denies                  ADULT SLP TREATMENT - 10/17/19 0001      General Information   Behavior/Cognition Alert;Decreased sustained attention;Cooperative;Pleasant mood;Requires cueing;Distractible      Treatment Provided   Treatment provided Cognitive-Linquistic      Cognitive-Linquistic Treatment   Treatment focused on Dysarthria;Cognition    Skilled Treatment Pt's dysarthria was addressed using EMST 150 device and structured speech and simple conversation tasks; cognition also targeted during these tasks by having pt track reps of EMST (rare mod-max cues for awareness of incorrect number of reps x1). Continues to require consistent mod cues for full breath prior to each blow, occasional min A for keeping shoulders still. Simple sentence responses - pt req'd occasional min cues for loudness incrd to usual cues for loudness after 6 minutes of the task. Pt read simple questions average 68 dB at 30 cm, required usual mod cues vel of effort/intensity. At end of session when SLP inquired about next appointment, pt opended his binder and told correct date/time without cues. (cog skills 12 minutes) SLP discussed the possiblitly of pt thinking of one thing he would like to accomplish for the next day, set an alarm (SLP had to give pt this idea as pt was stuck on "look in my notebook" for how to recall his task), and write it down in his weekly planner.         Assessment / Recommendations / Plan   Plan Continue with current plan of care   next session pt's last; he will cont at home     Progression Toward Goals   Progression toward goals Progressing toward goals            SLP Education - 10/17/19 0044    Education Details write down one task in your weekly planner each day (for the next day) and set an alarm to recall to do it    Person(s) Educated Patient;Spouse    Methods Explanation    Comprehension Verbalized understanding            SLP Short Term Goals - 10/13/19 1253      SLP SHORT TERM GOAL #1   Title pt  will demo the knowledge that he can use a memory system for daily events, ID'ing prospective and past events, medication management, and appoitmetn tracking, etc x 3 sessions    Period --   or 9 total visits, for all STGs   Status Not Met      SLP SHORT TERM GOAL #2   Title pt will demo selective atteniton for 5 mintues to perform simple cognitive lingiustic tasks in a min-mod noisy environment x2 sessions    Status Partially Met      SLP SHORT TERM GOAL #3   Title pt will tell SLP 2 congitive deficits affecting his life at home post-CVA in two sessions    Status Deferred      SLP SHORT TERM GOAL #4   Title pt will incr expiratory muscle strength taken in first 2 ST sessions via spirometer    Baseline MEP 45 on 08/13/19    Time 1    Status Partially Met            SLP Long Term Goals - 10/17/19 0044      SLP LONG TERM GOAL #1   Title pt will demo simple conversation of 5 mintues with average mid 60s dB x 3 sessions    Time 1    Period Weeks   or 17 sessions   Status Not Met      SLP LONG TERM GOAL #2   Title pt will demo selective atteniton for 7 mintues to perform simple cognitive lingiustic tasks in a min noisy environment x2 sessions    Time 1    Period Weeks    Status On-going      SLP LONG TERM GOAL #3   Title pt will demo WFL alternating attention in simple cognitve linguistic tasks with occasional min A in 3 sessions    Status Deferred      SLP LONG TERM GOAL #4   Title pt will independnently access memory notebook/planner for appoitnment management, to-do-lists, recall of prospective and past events, etc in 3 sessions    Baseline 10/13/19, 10-17-19    Time 1    Period Weeks    Status On-going            Plan - 10/17/19 0042    Clinical Impression Statement Pt presents today with  cognitive communication deficits which contiue to affect his ability to be independent and at their current level will require consistent assistance from wife/family for most needs. Pt  req'd SLP mod assistance to access memory book to find appropriate sections.. Wife states pt's voice seems louder in the last 1-2 weeks but she still needs to ask him to repeat. Pt and wife are comfortable completing EMST   and speech intelligibility tasks at home after d/c from ST in next 2-3 visits (until 17 total visits). SLP recommends skilled ST to address cognitive linguistics and dysarthria c/b reduced voice volume.    Speech Therapy Frequency 2x / week    Duration --   8 weeks or 17 total visits   Treatment/Interventions Environmental controls;Other (comment);Compensatory techniques;Functional tasks;SLP instruction and feedback;Cueing hierarchy;Cognitive reorganization;Patient/family education;Internal/external aids   EMST   Potential to Achieve Goals Fair    Potential Considerations Severity of impairments;Ability to learn/carryover information    Consulted and Agree with Plan of Care Patient;Family member/caregiver    Family Member Consulted wife           Patient will benefit from skilled therapeutic intervention in order to improve the following deficits and impairments:   Dysarthria and anarthria  Cognitive communication deficit    Problem List Patient Active Problem List   Diagnosis Date Noted  . Abnormality of gait 09/24/2019  . AKI (acute kidney injury) (HCC)   . Fever   . Sepsis without acute organ dysfunction (HCC)   . Elevated BUN   . Blood pressure increase diastolic   . Labile blood pressure   . Leukopenia   . Benign essential HTN   . Hypoalbuminemia   . Cerebrovascular accident (CVA) of right basal ganglia (HCC) 07/07/2019  . Acute ischemic stroke (HCC)   . Dyslipidemia   . Dysphagia, post-stroke   . Polycythemia   . Stroke (HCC) 06/30/2019  . Prediabetes 12/23/2016  . Elevated LFTs 12/23/2016  . Pure hypercholesterolemia 12/21/2016  . Alcoholism (HCC) 12/21/2016  . Eczema 09/18/2013  . Essential hypertension 09/18/2013  . Gastroesophageal reflux  disease without esophagitis 09/18/2013  . Tobacco abuse 09/18/2013    SCHINKE,[name] ,MS, CCC-SLP  10/17/2019, 12:46 AM  [facility] [address] [place], [place], [address] Phone: [phone]   Fax:  [phone]   Name: Russell Russell MRN: [id number] Date of Birth: 09/11/1950 

## 2019-10-20 ENCOUNTER — Ambulatory Visit: Payer: BC Managed Care – PPO

## 2019-10-20 ENCOUNTER — Encounter: Payer: BC Managed Care – PPO | Admitting: Speech Pathology

## 2019-10-20 ENCOUNTER — Ambulatory Visit: Payer: BC Managed Care – PPO | Admitting: Occupational Therapy

## 2019-10-20 ENCOUNTER — Encounter: Payer: Self-pay | Admitting: Occupational Therapy

## 2019-10-20 ENCOUNTER — Other Ambulatory Visit: Payer: Self-pay

## 2019-10-20 DIAGNOSIS — R2681 Unsteadiness on feet: Secondary | ICD-10-CM | POA: Diagnosis not present

## 2019-10-20 DIAGNOSIS — R471 Dysarthria and anarthria: Secondary | ICD-10-CM | POA: Diagnosis present

## 2019-10-20 DIAGNOSIS — R41840 Attention and concentration deficit: Secondary | ICD-10-CM | POA: Diagnosis present

## 2019-10-20 DIAGNOSIS — I69352 Hemiplegia and hemiparesis following cerebral infarction affecting left dominant side: Secondary | ICD-10-CM | POA: Diagnosis present

## 2019-10-20 DIAGNOSIS — M6281 Muscle weakness (generalized): Secondary | ICD-10-CM

## 2019-10-20 DIAGNOSIS — R278 Other lack of coordination: Secondary | ICD-10-CM

## 2019-10-20 DIAGNOSIS — R2689 Other abnormalities of gait and mobility: Secondary | ICD-10-CM

## 2019-10-20 DIAGNOSIS — R482 Apraxia: Secondary | ICD-10-CM | POA: Diagnosis present

## 2019-10-20 DIAGNOSIS — R209 Unspecified disturbances of skin sensation: Secondary | ICD-10-CM | POA: Diagnosis present

## 2019-10-20 DIAGNOSIS — R41841 Cognitive communication deficit: Secondary | ICD-10-CM | POA: Diagnosis present

## 2019-10-20 DIAGNOSIS — R208 Other disturbances of skin sensation: Secondary | ICD-10-CM

## 2019-10-20 NOTE — Therapy (Signed)
[facility] [address] [place], [name], [address] Phone: [phone]   Fax:  [phone]  Occupational Therapy Treatment  Patient Details  Name: Russell Russell MRN: [id number] Date of Birth: 08-Sep-1950 Referring Provider (OT): [name]   Encounter Date: 10/20/2019   OT End of Session - 10/20/19 1447    Visit Number 12    Number of Visits 25    Date for OT Re-Evaluation 12/04/19    Authorization Type BCBS? "Follow Medicare Guidelines"    Authorization - Number of Visits 11    Progress Note Due on Visit 58    OT Start Time 1315    OT Stop Time 1400    OT Time Calculation (min) 45 min    Activity Tolerance Patient tolerated treatment well    Behavior During Therapy [facility] for tasks assessed/performed           Past Medical History:  Diagnosis Date  . Alcohol abuse    drinks 1 gallon of Gin every weekend, nothing during the week x >15 yrs  . Elevated transaminase level    +elev bili: abd u/s 06/2014 showed stable small hepatic hemangiomas, o/w normal.  . Essential hypertension   . GERD (gastroesophageal reflux disease)   . Hyperlipidemia 03/2014   Atorv started-chol improved  . Impaired fasting glucose 03/2014  . Nephrolithiasis   . PUD (peptic ulcer disease)   . Stroke ([place])   . Tobacco dependence    chantix: "psych effects"    Past Surgical History:  Procedure Laterality Date  . COLONOSCOPY  2005   Recall 10 yrs ([place])  . LOOP RECORDER INSERTION N/A 07/03/2019   Procedure: LOOP RECORDER INSERTION;  Surgeon: Thompson Grayer, MD;  Location: [facility];  Service: Cardiovascular;  Laterality: N/A;  . removal of kidney stones  2006   cystoscopic--removed from ureter.  No prob since.    There were no vitals filed for this visit.   Subjective Assessment - 10/20/19 1315    Subjective  I have been walking a lot    Currently in Pain? No/denies    Pain Score 0-No pain                         OT Treatments/Exercises (OP) - 10/20/19 1432      ADLs   Functional Mobility Patient ambulates with rolling walker and close supervision on straight pathways with no distractions, e.g. empty hallway.  Patient requires cueing and min assist for functional ambulation which requires turning, backing up, navigating obstacles.  Patient apraxic, and has limited movement options when faced with these challenges.  He tends to neglect left leg, and moves unsafely.  Worked today in [employer].  For transporting items - patient issued walker bag to allow him to maintain hands on walker.  Worked on turning, opening/closing cupboards, refrigerator door.  Patient with poor ability to visually scan his environment while walking,  Patient looks at his feet.  Discussed why this would prohibit functional walking, and patient aware.  Patient needing frequent cueing to stand upright, look forward and advance left foot beyond right foot.                      OT Education - 10/20/19 1446    Education Details functional walking and scanning environment    Person(s) Educated Patient;Spouse    Methods Explanation;Demonstration;Tactile cues;Verbal cues    Comprehension Need further instruction  OT Short Term Goals - 10/05/19 1510      OT SHORT TERM GOAL #1   Title Patient will compelte a home exercise program designed to improve functional use of LUE with mod cueing and set up due 09/05/19    Status Achieved      OT SHORT TERM GOAL #2   Title Patient will don a front opening shirt with fasteners (buttons/zipper) with mod assist    Status Achieved      OT SHORT TERM GOAL #3   Title Patient will demonstrate ability to cut food on plate with min assist    Status Achieved      OT SHORT TERM GOAL #4   Title Patient will demonstrate low reach, grasp, release of lightweight small object (less than 1 lb) with min assist    Status Achieved      OT SHORT TERM GOAL  #5   Title Patient will ambulate into bathroom and cmplete toileting with supervision and verbal cueing assistance due 11/04/19    Time 4    Period Weeks    Status New    Target Date 11/04/19      Additional Short Term Goals   Additional Short Term Goals Yes      OT SHORT TERM GOAL #6   Title Patient will transport necessary items for bathing and dressing into bathroom to encourage greater independence with showering.    Time 4    Period Weeks    Status New             OT Long Term Goals - 10/05/19 1513      OT LONG TERM GOAL #1   Title Patient will complete an updated HEP to address LUE functioning due 10/05/19    Status On-going      OT LONG TERM GOAL #2   Title Patient will walk with assistive device into bathroom to use toilet with min assist    Status Achieved      OT LONG TERM GOAL #3   Title Patient will transfer onto shower seat with min assist    Status Achieved      OT LONG TERM GOAL #4   Title Patient will obtain and release a 2-3 lb object at chest height shelf using LUE and min assist    Status Achieved      OT LONG TERM GOAL #5   Title Patient will toilet himself with modified independence due 12/04/19    Time 8    Period Weeks    Status New    Target Date 12/04/19      Long Term Additional Goals   Additional Long Term Goals Yes      OT LONG TERM GOAL #6   Title Patient will shower self with supervision/set up assistance    Time 8    Period Weeks    Status New      OT LONG TERM GOAL #7   Title Patient will grocery shop with wife, and will locate and retrieve at least 5 items from list with only minimal assistance    Time 8    Period Weeks    Status New                 Plan - 10/20/19 1447    Clinical Impression Statement Patient with improved strength and conditioning, although still limited by apraxia, and left inattention.  Wife attends all therapy sessions.  Reports patient is doing well at home.  OT Frequency 2x / week    OT  Duration 8 weeks    OT Treatment/Interventions Self-care/ADL training;Electrical Stimulation;Therapeutic exercise;Visual/perceptual remediation/compensation;Patient/family education;Splinting;Neuromuscular education;Moist Heat;Aquatic Therapy;Balance training;Therapeutic activities;Functional Mobility Training;Fluidtherapy;DME and/or AE instruction;Manual Therapy;Cognitive remediation/compensation    Plan continue to address functional mobility and mid to high range functional reach    OT Home Exercise Plan coordination HEP    Consulted and Agree with Plan of Care Patient;Family member/caregiver    Family Member Consulted Wife, [name]           Patient will benefit from skilled therapeutic intervention in order to improve the following deficits and impairments:           Visit Diagnosis: Muscle weakness (generalized)  Unsteadiness on feet  Other lack of coordination  Hemiplegia and hemiparesis following cerebral infarction affecting left dominant side (HCC)  Apraxia  Other disturbances of skin sensation  Attention and concentration deficit    Problem List Patient Active Problem List   Diagnosis Date Noted  . Abnormality of gait 09/24/2019  . AKI (acute kidney injury) ([place])   . Fever   . Sepsis without acute organ dysfunction ([place])   . Elevated BUN   . Blood pressure increase diastolic   . Labile blood pressure   . Leukopenia   . Benign essential HTN   . Hypoalbuminemia   . Cerebrovascular accident (CVA) of right basal ganglia ([place]) 07/07/2019  . Acute ischemic stroke ([place])   . Dyslipidemia   . Dysphagia, post-stroke   . Polycythemia   . Stroke ([place]) 06/30/2019  . Prediabetes 12/23/2016  . Elevated LFTs 12/23/2016  . Pure hypercholesterolemia 12/21/2016  . Alcoholism ([place]) 12/21/2016  . Eczema 09/18/2013  . Essential hypertension 09/18/2013  . Gastroesophageal reflux disease without esophagitis 09/18/2013  . Tobacco abuse 09/18/2013    Mariah Milling,  OTR/L 10/20/2019, 2:53 PM  [facility] [address] [place], [name], [address] Phone: [phone]   Fax:  [phone]  Name: Zohan Shiflet MRN: [id number] Date of Birth: 12-14-1950

## 2019-10-20 NOTE — Therapy (Signed)
[facility] [address] [place], [name], [address] Phone: [phone]   Fax:  [phone]  Physical Therapy Treatment/Progress Note  Patient Details  Name: Russell Russell MRN: [id number] Date of Birth: Feb 11, 1951 Referring Provider (PT): Lauraine Rinne, PA-C  Physical Therapy Progress Note   Dates of Reporting Period: 09/17/19 - 10/20/19  See Note below for Objective Data and Assessment of Progress/Goals.  Thank you for the referral of this patient. Guillermina City, PT, DPT  Encounter Date: 10/20/2019   PT End of Session - 10/20/19 1235    Visit Number 20    Number of Visits 31    Date for PT Re-Evaluation 12/30/19   new POC for 8 weeks, Cert for 90 days   Authorization Type BCBS (follow medicare guidelines, 10th visit PN)    Progress Note Due on Visit 20    PT Start Time 1231    PT Stop Time 1313    PT Time Calculation (min) 42 min    Equipment Utilized During Treatment Gait belt    Activity Tolerance Patient tolerated treatment well    Behavior During Therapy WFL for tasks assessed/performed           Past Medical History:  Diagnosis Date  . Alcohol abuse    drinks 1 gallon of Gin every weekend, nothing during the week x >15 yrs  . Elevated transaminase level    +elev bili: abd u/s 06/2014 showed stable small hepatic hemangiomas, o/w normal.  . Essential hypertension   . GERD (gastroesophageal reflux disease)   . Hyperlipidemia 03/2014   Atorv started-chol improved  . Impaired fasting glucose 03/2014  . Nephrolithiasis   . PUD (peptic ulcer disease)   . Stroke ([place])   . Tobacco dependence    chantix: "psych effects"    Past Surgical History:  Procedure Laterality Date  . COLONOSCOPY  2005   Recall 10 yrs ([place])  . LOOP RECORDER INSERTION N/A 07/03/2019   Procedure: LOOP RECORDER INSERTION;  Surgeon: Thompson Grayer, MD;  Location: [facility];  Service: Cardiovascular;  Laterality: N/A;    . removal of kidney stones  2006   cystoscopic--removed from ureter.  No prob since.    There were no vitals filed for this visit.   Subjective Assessment - 10/20/19 1234    Subjective Patient reports no new complaints/concerns. No falls. No pain. Wife and patient report trying to walk more frequently in the house.    Patient is accompained by: Family member    Pertinent History PMH: HTN, alcohol abuse (ongoing use), HLD, GERD, PUD, tobacco use    Limitations Standing;Walking;House hold activities    Patient Stated Goals return to prior level of function, walk, "wheelchair is not apart of me" (wants to get out of the w/c)    Currently in Pain? No/denies                             [facility] Adult PT Treatment/Exercise - 10/20/19 0001      Transfers   Transfers Sit to Stand;Stand to Sit    Sit to Stand 5: Supervision    Stand to Sit 5: Supervision      Ambulation/Gait   Ambulation/Gait Yes    Ambulation/Gait Assistance 4: Min guard;4: Min assist    Ambulation/Gait Assistance Details Completed gait training with RW, with focus on improved step length and improved gait distances prior to require rest break. patient  able to ambulate x 310 feet today prior to requiring rest break. Min A needed and increased verbal cues required for last 50 feet due to fatigue in LLE.     Ambulation Distance (Feet) 310 Feet    Assistive device Rolling walker    Gait Pattern Step-through pattern;Step-to pattern;Decreased stride length;Decreased stance time - right;Decreased step length - left    Ambulation Surface Level;Indoor    Curb 4: Min assist    Curb Details (indicate cue type and reason) continued task specific training of acsending/descending curb, completed x 2 reps. Patietn require min A for completion still, and increased verbal/tactile cues for sequencing as patient demo inappropriate sequencing initially.       Neuro Re-ed    Neuro Re-ed Details  Completed standing balance  activity on blue airex pad: inlcuding working on maintaining balance and improved posture with minimal UE support, 3 x 30-45 seconds. With no UE support, patient demo lateral lean to L side. Completed reaching actviity inlcuding placing large pegs on target in front, alternating between use of LUE/RUE. Completed standing balance actvities 2 x 4 minutes.                      PT Short Term Goals - 10/01/19 1251      PT SHORT TERM GOAL #1   Title Patient will demonstrate ability to ambulate >400 ft on indoor level surfaces with LRAD and CGA to demonstrate improved mobility    Baseline 200 ft, Min Guard to [place]    Time 4    Period Weeks    Status New    Target Date 10/29/19      PT SHORT TERM GOAL #2   Title Patient will demonstrate ability to ascend/descend 1 curb with [employer] and RW to demo improved entrance/exit of home    Baseline Min A    Time 4    Period Weeks    Status New    Target Date 10/29/19      PT SHORT TERM GOAL #3   Title --    Baseline --    Time --    Period --    Status --    Target Date --      PT SHORT TERM GOAL #4   Title --    Baseline --    Time --    Period --    Status --    Target Date --             PT Long Term Goals - 10/01/19 1237      PT LONG TERM GOAL #1   Title Patient will be independent with Final HEP, with wifes assistance (ALL LTG's Due: 11/26/2019)    Baseline Continue to progress HEP at this time    Time 8    Period Weeks    Status On-going    Target Date 11/26/19      PT LONG TERM GOAL #2   Title Patient will demonstrate ability to complete all transfers, Mod I, LRAD to demonstrate improved independence with functional mobility    Baseline CGA on 7/22    Time 8    Period Weeks    Status New    Target Date 11/26/19      PT LONG TERM GOAL #3   Title Patient will demo ability to ambulate >200 ft on outdoor/unlevel surfaces to demo improved community mobility    Baseline not yet assessed    Time  8    Period  Weeks    Status New    Target Date 11/26/19      PT LONG TERM GOAL #4   Title Patient will demonstrate ability to complete sit <> stand, Mod I, w/ LRAD to demonstrate improved functional mobility    Baseline Supv Level 7/22    Time 8    Period Weeks    Status Revised    Target Date 11/26/19      PT LONG TERM GOAL #5   Title Patient will demo ability to ascend/descend curb with LRAD and Supervision to demonstrate improved ability to enter/exit home    Baseline Min A    Time 8    Period Weeks    Status New    Target Date 11/26/19                 Plan - 10/20/19 1426    Clinical Impression Statement Continued gait training today with focus on improved ambulation distances, patient able to ambulate 310 feet today prior to needing a rest break. Continued task specific training on curb to allow for improved entry/exit of home. Patient is demonstrating good progress with PT services and will continue to benefit from skilled PT services.    Personal Factors and Comorbidities Comorbidity 3+    Comorbidities HTN, alcohol abuse (ongoing use), HLD, GERD, PUD, tobacco use    Examination-Activity Limitations Bed Mobility;Dressing;Locomotion Level;Stairs;Stand;Toileting;Transfers;Bathing    Examination-Participation Restrictions Driving;Interpersonal Relationship;Yard Work    [employer] Evolving/Moderate complexity    Rehab Potential Good    PT Frequency 2x / week    PT Duration 8 weeks    PT Treatment/Interventions ADLs/Self Care Home Management;Electrical Stimulation;DME Instruction;Moist Heat;Cryotherapy;Gait training;Stair training;Functional mobility training;Therapeutic activities;Therapeutic exercise;Balance training;Neuromuscular re-education;Patient/family education;Orthotic Fit/Training;Wheelchair mobility training;Passive range of motion    PT Next Visit Plan Continued gait training. NMR/weight shifting; curb training: static standing balance exercises     Consulted and Agree with Plan of Care Patient;Family member/caregiver    Family Member Consulted Wife           Patient will benefit from skilled therapeutic intervention in order to improve the following deficits and impairments:  Abnormal gait, Decreased coordination, Difficulty walking, Impaired tone, Decreased safety awareness, Decreased endurance, Decreased activity tolerance, Pain, Decreased balance, Decreased knowledge of use of DME, Postural dysfunction, Impaired sensation, Decreased strength, Decreased mobility  Visit Diagnosis: Muscle weakness (generalized)  Unsteadiness on feet  Other abnormalities of gait and mobility  Other lack of coordination  Hemiplegia and hemiparesis following cerebral infarction affecting left dominant side [place])     Problem List Patient Active Problem List   Diagnosis Date Noted  . Abnormality of gait 09/24/2019  . AKI (acute kidney injury) ([place])   . Fever   . Sepsis without acute organ dysfunction ([place])   . Elevated BUN   . Blood pressure increase diastolic   . Labile blood pressure   . Leukopenia   . Benign essential HTN   . Hypoalbuminemia   . Cerebrovascular accident (CVA) of right basal ganglia ([place]) 07/07/2019  . Acute ischemic stroke ([place])   . Dyslipidemia   . Dysphagia, post-stroke   . Polycythemia   . Stroke ([place]) 06/30/2019  . Prediabetes 12/23/2016  . Elevated LFTs 12/23/2016  . Pure hypercholesterolemia 12/21/2016  . Alcoholism ([place]) 12/21/2016  . Eczema 09/18/2013  . Essential hypertension 09/18/2013  . Gastroesophageal reflux disease without esophagitis 09/18/2013  . Tobacco abuse 09/18/2013    Jones Bales, PT, DPT  10/20/2019, 2:28 PM  [facility] [address] [place], [name], [address] Phone: [phone]   Fax:  [phone]  Name: Mendel Binsfeld MRN: [id number] Date of Birth: [birth year]

## 2019-10-22 ENCOUNTER — Encounter: Payer: Self-pay | Admitting: Occupational Therapy

## 2019-10-22 ENCOUNTER — Ambulatory Visit: Payer: BC Managed Care – PPO | Admitting: Occupational Therapy

## 2019-10-22 ENCOUNTER — Other Ambulatory Visit: Payer: Self-pay

## 2019-10-22 DIAGNOSIS — R41840 Attention and concentration deficit: Secondary | ICD-10-CM

## 2019-10-22 DIAGNOSIS — R278 Other lack of coordination: Secondary | ICD-10-CM

## 2019-10-22 DIAGNOSIS — R208 Other disturbances of skin sensation: Secondary | ICD-10-CM

## 2019-10-22 DIAGNOSIS — R2681 Unsteadiness on feet: Secondary | ICD-10-CM

## 2019-10-22 DIAGNOSIS — M6281 Muscle weakness (generalized): Secondary | ICD-10-CM

## 2019-10-22 DIAGNOSIS — I69352 Hemiplegia and hemiparesis following cerebral infarction affecting left dominant side: Secondary | ICD-10-CM

## 2019-10-22 DIAGNOSIS — R482 Apraxia: Secondary | ICD-10-CM

## 2019-10-22 NOTE — Therapy (Signed)
[facility] [address] [place], [name], [address] Phone: [phone]   Fax:  [phone]  Occupational Therapy Treatment  Patient Details  Name: Russell Russell MRN: [id number] Date of Birth: 1950/09/23 Referring Provider (OT): [name]   Encounter Date: 10/22/2019   OT End of Session - 10/22/19 1553    Visit Number 13    Number of Visits 25    Date for OT Re-Evaluation 12/04/19    Authorization Type BCBS? "Follow Medicare Guidelines"    Authorization - Number of Visits 12    Progress Note Due on Visit 11    OT Start Time 1402    OT Stop Time 1445    OT Time Calculation (min) 43 min    Activity Tolerance Patient tolerated treatment well    Behavior During Therapy [name] for tasks assessed/performed           Past Medical History:  Diagnosis Date  . Alcohol abuse    drinks 1 gallon of Gin every weekend, nothing during the week x >15 yrs  . Elevated transaminase level    +elev bili: abd u/s 06/2014 showed stable small hepatic hemangiomas, o/w normal.  . Essential hypertension   . GERD (gastroesophageal reflux disease)   . Hyperlipidemia 03/2014   Atorv started-chol improved  . Impaired fasting glucose 03/2014  . Nephrolithiasis   . PUD (peptic ulcer disease)   . Stroke ([place])   . Tobacco dependence    chantix: "psych effects"    Past Surgical History:  Procedure Laterality Date  . COLONOSCOPY  2005   Recall 10 yrs ([place])  . LOOP RECORDER INSERTION N/A 07/03/2019   Procedure: LOOP RECORDER INSERTION;  Surgeon: Thompson Grayer, MD;  Location: [facility];  Service: Cardiovascular;  Laterality: N/A;  . removal of kidney stones  2006   cystoscopic--removed from ureter.  No prob since.    There were no vitals filed for this visit.   Subjective Assessment - 10/22/19 1412    Subjective  I do most things without help now    Patient is accompanied by: Family member    Pertinent History HTN,  ETOH use, smoker, loop recorder    Currently in Pain? No/denies    Pain Score 0-No pain                        OT Treatments/Exercises (OP) - 10/22/19 0001      Neurological Re-education Exercises   Other Exercises 1 Neuromuscular reeducation to address high level reach in seated and standing.  Patient requires facilitation for active weight shift toward RIGHT side.  Patient still has pushing behaviors when physically challenged.  Patient able now to reach LUE well overhead, and extend left trunk.  Patient with difficulty with fine motor control in LUE, and needs increased time and cueing to help him orient hand to task - today was grooved pegboard.      Other Exercises 2 Standing weight shift left and right with arms overhead.  Patient heavilly UE reliant for standing tasks despite significantly improved strength in LLE.  Patient's most significant impairments continue to be perceptual motor.  Patient and wife do report improving independence and autonomy with ADL.                      OT Short Term Goals - 10/22/19 1555      OT SHORT TERM GOAL #1   Title Patient  will compelte a home exercise program designed to improve functional use of LUE with mod cueing and set up due 09/05/19    Status Achieved      OT SHORT TERM GOAL #2   Title Patient will don a front opening shirt with fasteners (buttons/zipper) with mod assist    Status Achieved      OT SHORT TERM GOAL #3   Title Patient will demonstrate ability to cut food on plate with min assist    Status Achieved      OT SHORT TERM GOAL #4   Title Patient will demonstrate low reach, grasp, release of lightweight small object (less than 1 lb) with min assist    Status Achieved      OT SHORT TERM GOAL #5   Title Patient will ambulate into bathroom and cmplete toileting with supervision and verbal cueing assistance due 11/04/19    Time 4    Period Weeks    Status New    Target Date 11/04/19      OT SHORT TERM  GOAL #6   Title Patient will transport necessary items for bathing and dressing into bathroom to encourage greater independence with showering.    Time 4    Period Weeks    Status New             OT Long Term Goals - 10/22/19 1555      OT LONG TERM GOAL #1   Title Patient will complete an updated HEP to address LUE functioning due 10/05/19    Status On-going      OT LONG TERM GOAL #2   Title Patient will walk with assistive device into bathroom to use toilet with min assist    Status Achieved      OT LONG TERM GOAL #3   Title Patient will transfer onto shower seat with min assist    Status Achieved      OT LONG TERM GOAL #4   Title Patient will obtain and release a 2-3 lb object at chest height shelf using LUE and min assist    Status Achieved      OT LONG TERM GOAL #5   Title Patient will toilet himself with modified independence due 12/04/19    Time 8    Period Weeks    Status New      OT LONG TERM GOAL #6   Title Patient will shower self with supervision/set up assistance    Time 8    Period Weeks    Status New      OT LONG TERM GOAL #7   Title Patient will grocery shop with wife, and will locate and retrieve at least 5 items from list with only minimal assistance    Time 8    Period Weeks    Status New                 Plan - 10/22/19 1553    Clinical Impression Statement Patient continues to have perceptual motor skills challenges tht are most prevalent when walking and needing to avoid obstacle, change dorection, etc.  Patient showing improvement in LUE active movement and strength    OT Occupational Profile and History Detailed Assessment- Review of Records and additional review of physical, cognitive, psychosocial history related to current functional performance    Occupational performance deficits (Please refer to evaluation for details): ADL's;IADL's;Rest and Sleep;Leisure    Body Structure / Function / Physical Skills  ADL;Coordination;Endurance;GMC;UE functional use;Sensation;Decreased knowledge of  precautions;Balance;Body mechanics;Decreased knowledge of use of DME;Flexibility;IADL;Pain;Vision;Cardiopulmonary status limiting activity;Dexterity;FMC;Proprioception;Strength;Tone;ROM    Cognitive Skills Attention;Orientation;Thought;Perception;Problem Solve;Safety Awareness;Sequencing;Memory;Learn    Psychosocial Skills Habits;Routines and Behaviors    Rehab Potential Good    Clinical Decision Making Several treatment options, min-mod task modification necessary    Comorbidities Affecting Occupational Performance: May have comorbidities impacting occupational performance    Modification or Assistance to Complete Evaluation  Min-Moderate modification of tasks or assist with assess necessary to complete eval    OT Frequency 2x / week    OT Duration 8 weeks    OT Treatment/Interventions Self-care/ADL training;Electrical Stimulation;Therapeutic exercise;Visual/perceptual remediation/compensation;Patient/family education;Splinting;Neuromuscular education;Moist Heat;Aquatic Therapy;Balance training;Therapeutic activities;Functional Mobility Training;Fluidtherapy;DME and/or AE instruction;Manual Therapy;Cognitive remediation/compensation    Plan continue to address functional mobility and mid to high range functional reach    OT Home Exercise Plan coordination HEP    Consulted and Agree with Plan of Care Patient;Family member/caregiver    Family Member Consulted Wife, [name]           Patient will benefit from skilled therapeutic intervention in order to improve the following deficits and impairments:   Body Structure / Function / Physical Skills: ADL, Coordination, Endurance, GMC, UE functional use, Sensation, Decreased knowledge of precautions, Balance, Body mechanics, Decreased knowledge of use of DME, Flexibility, IADL, Pain, Vision, Cardiopulmonary status limiting activity, Dexterity, FMC, Proprioception, Strength,  Tone, ROM Cognitive Skills: Attention, Orientation, Thought, Perception, Problem Solve, Safety Awareness, Sequencing, Memory, Learn Psychosocial Skills: Habits, Routines and Behaviors   Visit Diagnosis: Apraxia  Hemiplegia and hemiparesis following cerebral infarction affecting left dominant side ([facility])  Other lack of coordination  Unsteadiness on feet  Muscle weakness (generalized)  Other disturbances of skin sensation  Attention and concentration deficit    Problem List Patient Active Problem List   Diagnosis Date Noted  . Abnormality of gait 09/24/2019  . AKI (acute kidney injury) ([place])   . Fever   . Sepsis without acute organ dysfunction ([place])   . Elevated BUN   . Blood pressure increase diastolic   . Labile blood pressure   . Leukopenia   . Benign essential HTN   . Hypoalbuminemia   . Cerebrovascular accident (CVA) of right basal ganglia ([place]) 07/07/2019  . Acute ischemic stroke ([place])   . Dyslipidemia   . Dysphagia, post-stroke   . Polycythemia   . Stroke ([place]) 06/30/2019  . Prediabetes 12/23/2016  . Elevated LFTs 12/23/2016  . Pure hypercholesterolemia 12/21/2016  . Alcoholism ([place]) 12/21/2016  . Eczema 09/18/2013  . Essential hypertension 09/18/2013  . Gastroesophageal reflux disease without esophagitis 09/18/2013  . Tobacco abuse 09/18/2013    Mariah Milling, OTR/L 10/22/2019, 3:56 PM  [facility] [address] [place], [name], [address] Phone: [phone]   Fax:  [phone]  Name: Russell Russell MRN: [id number] Date of Birth: 30-Apr-1950

## 2019-10-23 ENCOUNTER — Ambulatory Visit: Payer: BC Managed Care – PPO

## 2019-10-23 DIAGNOSIS — R471 Dysarthria and anarthria: Secondary | ICD-10-CM

## 2019-10-23 DIAGNOSIS — R2681 Unsteadiness on feet: Secondary | ICD-10-CM | POA: Diagnosis not present

## 2019-10-23 DIAGNOSIS — R41841 Cognitive communication deficit: Secondary | ICD-10-CM

## 2019-10-23 NOTE — Therapy (Signed)
[facility] [address] [place], [name], [address] Phone: [phone]   Fax:  [phone]  Speech Language Pathology Treatment/discharge summary  Patient Details  Name: Russell Russell MRN: [id number] Date of Birth: [birth year] Referring Provider (SLP): Stann Mainland, [place]   Encounter Date: 10/23/2019   End of Session - 10/23/19 1753    Visit Number 17    Number of Visits 17    Date for SLP Re-Evaluation 11/05/19    SLP Start Time 2706    SLP Stop Time  1445    SLP Time Calculation (min) 40 min    Activity Tolerance Patient tolerated treatment well           Past Medical History:  Diagnosis Date  . Alcohol abuse    drinks 1 gallon of Gin every weekend, nothing during the week x >15 yrs  . Elevated transaminase level    +elev bili: abd u/s 06/2014 showed stable small hepatic hemangiomas, o/w normal.  . Essential hypertension   . GERD (gastroesophageal reflux disease)   . Hyperlipidemia 03/2014   Atorv started-chol improved  . Impaired fasting glucose 03/2014  . Nephrolithiasis   . PUD (peptic ulcer disease)   . Stroke ([place])   . Tobacco dependence    chantix: "psych effects"    Past Surgical History:  Procedure Laterality Date  . COLONOSCOPY  2005   Recall 10 yrs ([place])  . LOOP RECORDER INSERTION N/A 07/03/2019   Procedure: LOOP RECORDER INSERTION;  Surgeon: Thompson Grayer, MD;  Location: [facility];  Service: Cardiovascular;  Laterality: N/A;  . removal of kidney stones  2006   cystoscopic--removed from ureter.  No prob since.    There were no vitals filed for this visit.          ADULT SLP TREATMENT - 10/23/19 1435      General Information   Behavior/Cognition Alert;Decreased sustained attention;Cooperative;Pleasant mood;Requires cueing;Distractible      Treatment Provided   Treatment provided Cognitive-Linquistic      Cognitive-Linquistic Treatment   Treatment focused on  Dysarthria;Cognition    Skilled Treatment Pt req'd usual cues for ansering questinos regarding each section in his memory binder. He looked at the calendar independently but req'd initial mod-max A for finding next therapy date/s. Pt did not have any entries in his weekly calendar sheets filled out - last visit SLP suggested pt think of one thing he would like to do for the next day adn write it down and put alarm/reminder in his phone to do it. Pt completed EMST with usual cues for full breath and occasional cues for shoulders to be still. SLP went over in detail how to modify EMST device (see pt instructions). Neither pt nor wife had any questions.       Assessment / Recommendations / Plan   Plan Discharge SLP treatment due to (comment)   pt to cont at home     Progression Toward Goals   Progression toward goals Progressing toward goals            SLP Education - 10/23/19 1753    Education Details modifying EMST device    Person(s) Educated Patient;Spouse    Methods Explanation;Demonstration;Handout    Comprehension Verbalized understanding            SLP Short Term Goals - 10/13/19 1253      SLP SHORT TERM GOAL #1   Title pt will demo the knowledge that he can use a  memory system for daily events, ID'ing prospective and past events, medication management, and appoitmetn tracking, etc x 3 sessions    Period --   or 9 total visits, for all STGs   Status Not Met      SLP SHORT TERM GOAL #2   Title pt will demo selective atteniton for 5 mintues to perform simple cognitive lingiustic tasks in a min-mod noisy environment x2 sessions    Status Partially Met      SLP SHORT TERM GOAL #3   Title pt will tell SLP 2 congitive deficits affecting his life at home post-CVA in two sessions    Status Deferred      SLP SHORT TERM GOAL #4   Title pt will incr expiratory muscle strength taken in first 2 ST sessions via spirometer    Baseline MEP 45 on 08/13/19    Time 1    Status Partially Met             SLP Long Term Goals - 10/23/19 1428      SLP LONG TERM GOAL #1   Title pt will demo simple conversation of 5 mintues with average mid 60s dB x 3 sessions    Time 1    Period Weeks   or 17 sessions   Status Not Met      SLP LONG TERM GOAL #2   Title pt will demo selective atteniton for 7 mintues to perform simple cognitive lingiustic tasks in a min noisy environment x2 sessions    Time 1    Period Weeks    Status Not Met      SLP LONG TERM GOAL #3   Title pt will demo WFL alternating attention in simple cognitve linguistic tasks with occasional min A in 3 sessions    Status Deferred      SLP LONG TERM GOAL #4   Title pt will independnently access memory notebook/planner for appoitnment management, to-do-lists, recall of prospective and past events, etc in 3 sessions    Baseline 10/13/19, 10-17-19    Period Weeks    Status Partially Met            Plan - 10/23/19 1753    Clinical Impression Statement Pt presents today with  cognitive communication deficits which contiue to affect his ability to be independent and at their current level will require consistent assistance from wife/family for most needs. Pt req'd SLP mod assistance to access memory book to find appropriate sections.. Wife states pt's voice seems louder in the last 1-2 weeks but she still needs to ask him to repeat. Pt and wife are comfortable completing EMST and speech intelligibility tasks at home after d/c today and did not have any quesitons for SLP at any time when asked during session today.    Treatment/Interventions Environmental controls;Other (comment);Compensatory techniques;Functional tasks;SLP instruction and feedback;Cueing hierarchy;Cognitive reorganization;Patient/family education;Internal/external aids   EMST   Potential to Achieve Goals Fair    Potential Considerations Severity of impairments;Ability to learn/carryover information    Consulted and Agree with Plan of Care Patient;Family  member/caregiver    Family Member Consulted wife           Patient will benefit from skilled therapeutic intervention in order to improve the following deficits and impairments:   Dysarthria and anarthria  Cognitive communication deficit   SPEECH THERAPY DISCHARGE SUMMARY  Visits from Start of Care: 17  Current functional level related to goals / functional outcomes: Pt made some gains with lower  level attention and awareness. He improved his expiratory strength and his voice is (subjectively) louder than upon date of eval.  See goal summary above.   Remaining deficits: Cogntiive communication deficits, dysarthria   Education / Equipment: EMST procedure   Plan: Patient agrees to discharge.  Patient goals were partially met. Patient is being discharged due to being pleased with the current functional level.  ?????Wife and pt state they can cont these things at home.       Problem List Patient Active Problem List   Diagnosis Date Noted  . Abnormality of gait 09/24/2019  . AKI (acute kidney injury) ([place])   . Fever   . Sepsis without acute organ dysfunction ([place])   . Elevated BUN   . Blood pressure increase diastolic   . Labile blood pressure   . Leukopenia   . Benign essential HTN   . Hypoalbuminemia   . Cerebrovascular accident (CVA) of right basal ganglia ([facility]) 07/07/2019  . Acute ischemic stroke ([facility])   . Dyslipidemia   . Dysphagia, post-stroke   . Polycythemia   . Stroke ([place]) 06/30/2019  . Prediabetes 12/23/2016  . Elevated LFTs 12/23/2016  . Pure hypercholesterolemia 12/21/2016  . Alcoholism ([place]) 12/21/2016  . Eczema 09/18/2013  . Essential hypertension 09/18/2013  . Gastroesophageal reflux disease without esophagitis 09/18/2013  . Tobacco abuse 09/18/2013    [facility] 10/23/2019, 5:55 PM  [facility] [address] [place], [name], [address] Phone: [phone]   Fax:   [phone]   Name: Russell Russell MRN: [id number] Date of Birth: [birth year]

## 2019-10-23 NOTE — Patient Instructions (Addendum)
  How to adjust the "blower"  We want Russell Russell's effort with the blower to be at an 8 out of 10 (10= the most effort he can give). If it seems like it's getting easy for him to blow it, ask him what the effort level is on a scale of 1-10 and if it's 6 or less, turn the knob 1/8 turn and have him blow.  Ask him again what the effort level is on a scale of 1-10 and if it's 6 or less, turn the knob 1/8 turn MORE and have him blow again. Stop when he says the effort is an 8 out of 10.  If he says MORE than 8 (9, 10) then back it off until he says an 8 out of 10.

## 2019-10-24 ENCOUNTER — Other Ambulatory Visit: Payer: Self-pay | Admitting: Physician Assistant

## 2019-10-27 ENCOUNTER — Ambulatory Visit: Payer: BC Managed Care – PPO

## 2019-10-27 ENCOUNTER — Other Ambulatory Visit: Payer: Self-pay

## 2019-10-27 DIAGNOSIS — R2681 Unsteadiness on feet: Secondary | ICD-10-CM

## 2019-10-27 DIAGNOSIS — M6281 Muscle weakness (generalized): Secondary | ICD-10-CM

## 2019-10-27 DIAGNOSIS — R2689 Other abnormalities of gait and mobility: Secondary | ICD-10-CM

## 2019-10-27 DIAGNOSIS — I69352 Hemiplegia and hemiparesis following cerebral infarction affecting left dominant side: Secondary | ICD-10-CM

## 2019-10-27 NOTE — Therapy (Signed)
[facility] [address] [place], [name], [address] Phone: [phone]   Fax:  [phone]  Physical Therapy Treatment  Patient Details  Name: Russell Russell MRN: [id number] Date of Birth: 1950-12-30 Referring Provider (PT): Lauraine Rinne, PA-C   Encounter Date: 10/27/2019   PT End of Session - 10/27/19 1107    Visit Number 21    Number of Visits 31    Date for PT Re-Evaluation 12/30/19   new POC for 8 weeks, Cert for 90 days   Authorization Type [employer] (follow medicare guidelines, 10th visit PN)    Progress Note Due on Visit 20    PT Start Time 1102    PT Stop Time 1144    PT Time Calculation (min) 42 min    Equipment Utilized During Treatment Gait belt    Activity Tolerance Patient tolerated treatment well    Behavior During Therapy WFL for tasks assessed/performed           Past Medical History:  Diagnosis Date   Alcohol abuse    drinks 1 gallon of Gin every weekend, nothing during the week x >15 yrs   Elevated transaminase level    +elev bili: abd u/s 06/2014 showed stable small hepatic hemangiomas, o/w normal.   Essential hypertension    GERD (gastroesophageal reflux disease)    Hyperlipidemia 03/2014   Atorv started-chol improved   Impaired fasting glucose 03/2014   Nephrolithiasis    PUD (peptic ulcer disease)    Stroke ([place])    Tobacco dependence    chantix: "psych effects"    Past Surgical History:  Procedure Laterality Date   COLONOSCOPY  2005   Recall 10 yrs ([place])   LOOP RECORDER INSERTION N/A 07/03/2019   Procedure: LOOP RECORDER INSERTION;  Surgeon: Thompson Grayer, MD;  Location: [facility];  Service: Cardiovascular;  Laterality: N/A;   removal of kidney stones  2006   cystoscopic--removed from ureter.  No prob since.    There were no vitals filed for this visit.   Subjective Assessment - 10/27/19 1105    Subjective Patient reports doing good at home. No  falls.    Patient is accompained by: Family member    Pertinent History PMH: HTN, alcohol abuse (ongoing use), HLD, GERD, PUD, tobacco use    Limitations Standing;Walking;House hold activities    Patient Stated Goals return to prior level of function, walk, "wheelchair is not apart of me" (wants to get out of the w/c)    Currently in Pain? No/denies                             [facility] Adult PT Treatment/Exercise - 10/27/19 0001      Transfers   Transfers Sit to Stand;Stand to Sit    Sit to Stand 5: Supervision    Stand to Sit 5: Supervision    Comments completed sit <> stand training with 2" step placed under RLE to promote weight shift to LLE. PT educating for improved upright posture in standing, and having patient look upward as tendency to keep eyes focused on feet leading to slumped posture.       Ambulation/Gait   Ambulation/Gait Yes    Ambulation/Gait Assistance 4: Min guard    Ambulation/Gait Assistance Details Completed gait training with RW x 422 ft with CGA throughout the entirety of ambulation. Dance sock applied to the LLE to help with swing phase with fatigue,  with patient not requiring Min A for LLE during ambulation. Fatigue noted toward end of ambulation with last 50-75 ft.     Ambulation Distance (Feet) 422 Feet    Assistive device Rolling walker    Gait Pattern Step-through pattern;Step-to pattern;Decreased stride length;Decreased stance time - right;Decreased step length - left    Ambulation Surface Level;Indoor    Curb Other (comment)   CGA/Min Guard   Curb Details (indicate cue type and reason) continued ascending/descending curb x 2 reps, PT providing CGA throughout with completion. PT having to provide verbal cues for sequencing pattern with descending curb but able to properly ascend without verbal cues, and show improvement with alignment of RW and ensuring in close proximity to curb before trying to ascend/descend for improved technique.        Neuro Re-ed    Neuro Re-ed Details  Standing on firm surface completed alternating toe taps to cones with R/LLE x 15 reps. Completed with UE support from RW, attempted completion wihtout UE support and patient demo difficulty with weight acceptance on LLE, and maintaining balance.                     PT Short Term Goals - 10/27/19 1116      PT SHORT TERM GOAL #1   Title Patient will demonstrate ability to ambulate >400 ft on indoor level surfaces with LRAD and CGA to demonstrate improved mobility    Baseline 422 ft, Min Guard    Time 4    Period Weeks    Status Achieved    Target Date 10/29/19      PT SHORT TERM GOAL #2   Title Patient will demonstrate ability to ascend/descend 1 curb with [employer] and RW to demo improved entrance/exit of home    Baseline Patient able to ascend/descend single curb with CGA/Min Guard and RW.    Time 4    Period Weeks    Status Achieved    Target Date 10/29/19             PT Long Term Goals - 10/01/19 1237      PT LONG TERM GOAL #1   Title Patient will be independent with Final HEP, with wifes assistance (ALL LTG's Due: 11/26/2019)    Baseline Continue to progress HEP at this time    Time 8    Period Weeks    Status On-going    Target Date 11/26/19      PT LONG TERM GOAL #2   Title Patient will demonstrate ability to complete all transfers, Mod I, LRAD to demonstrate improved independence with functional mobility    Baseline CGA on 7/22    Time 8    Period Weeks    Status New    Target Date 11/26/19      PT LONG TERM GOAL #3   Title Patient will demo ability to ambulate >200 ft on outdoor/unlevel surfaces to demo improved community mobility    Baseline not yet assessed    Time 8    Period Weeks    Status New    Target Date 11/26/19      PT LONG TERM GOAL #4   Title Patient will demonstrate ability to complete sit <> stand, Mod I, w/ LRAD to demonstrate improved functional mobility    Baseline Supv Level 7/22    Time 8     Period Weeks    Status Revised    Target Date 11/26/19  PT LONG TERM GOAL #5   Title Patient will demo ability to ascend/descend curb with LRAD and Supervision to demonstrate improved ability to enter/exit home    Baseline Min A    Time 8    Period Weeks    Status New    Target Date 11/26/19                 Plan - 10/27/19 1321    Clinical Impression Statement Today's skilled PT session included assessment of patient's progress toward STG's. Patient able to meet all STG today, demonstrating ability to ambulate 422 ft with RW and Min Guard demonstrating imporved functional mobility and gait distance. Patient is demonstrating significant progress with PT services and will continue to benefit from skilled PT services to progress toward all LTG's.    Personal Factors and Comorbidities Comorbidity 3+    Comorbidities HTN, alcohol abuse (ongoing use), HLD, GERD, PUD, tobacco use    Examination-Activity Limitations Bed Mobility;Dressing;Locomotion Level;Stairs;Stand;Toileting;Transfers;Bathing    Examination-Participation Restrictions Driving;Interpersonal Relationship;Yard Work    [employer] Evolving/Moderate complexity    Rehab Potential Good    PT Frequency 2x / week    PT Duration 8 weeks    PT Treatment/Interventions ADLs/Self Care Home Management;Electrical Stimulation;DME Instruction;Moist Heat;Cryotherapy;Gait training;Stair training;Functional mobility training;Therapeutic activities;Therapeutic exercise;Balance training;Neuromuscular re-education;Patient/family education;Orthotic Fit/Training;Wheelchair mobility training;Passive range of motion    PT Next Visit Plan Continued gait training. NMR/weight shifting; curb training: static standing balance exercises    Consulted and Agree with Plan of Care Patient;Family member/caregiver    Family Member Consulted Wife           Patient will benefit from skilled therapeutic intervention in order to  improve the following deficits and impairments:  Abnormal gait, Decreased coordination, Difficulty walking, Impaired tone, Decreased safety awareness, Decreased endurance, Decreased activity tolerance, Pain, Decreased balance, Decreased knowledge of use of DME, Postural dysfunction, Impaired sensation, Decreased strength, Decreased mobility  Visit Diagnosis: Hemiplegia and hemiparesis following cerebral infarction affecting left dominant side (HCC)  Unsteadiness on feet  Muscle weakness (generalized)  Other abnormalities of gait and mobility     Problem List Patient Active Problem List   Diagnosis Date Noted   Abnormality of gait 09/24/2019   AKI (acute kidney injury) ([place])    Fever    Sepsis without acute organ dysfunction (HCC)    Elevated BUN    Blood pressure increase diastolic    Labile blood pressure    Leukopenia    Benign essential HTN    Hypoalbuminemia    Cerebrovascular accident (CVA) of right basal ganglia ([place]) 07/07/2019   Acute ischemic stroke ([facility] [facility])    Dyslipidemia    Dysphagia, post-stroke    Polycythemia    Stroke ([place]) 06/30/2019   Prediabetes 12/23/2016   Elevated LFTs 12/23/2016   Pure hypercholesterolemia 12/21/2016   Alcoholism ([place]) 12/21/2016   Eczema 09/18/2013   Essential hypertension 09/18/2013   Gastroesophageal reflux disease without esophagitis 09/18/2013   Tobacco abuse 09/18/2013    Jones Bales, PT, DPT 10/27/2019, 1:23 PM  [facility] [address] [place], [name], [address] Phone: [phone]   Fax:  [phone]  Name: Quince Santana MRN: [id number] Date of Birth: Jun 05, 1950

## 2019-10-30 ENCOUNTER — Ambulatory Visit: Payer: BC Managed Care – PPO | Admitting: Physical Therapy

## 2019-10-30 ENCOUNTER — Other Ambulatory Visit: Payer: Self-pay

## 2019-10-30 ENCOUNTER — Encounter: Payer: Self-pay | Admitting: Physical Therapy

## 2019-10-30 DIAGNOSIS — R2689 Other abnormalities of gait and mobility: Secondary | ICD-10-CM

## 2019-10-30 DIAGNOSIS — M6281 Muscle weakness (generalized): Secondary | ICD-10-CM

## 2019-10-30 DIAGNOSIS — R2681 Unsteadiness on feet: Secondary | ICD-10-CM | POA: Diagnosis not present

## 2019-10-30 DIAGNOSIS — I69352 Hemiplegia and hemiparesis following cerebral infarction affecting left dominant side: Secondary | ICD-10-CM

## 2019-10-30 NOTE — Therapy (Signed)
[facility] [address] [place], [name], [address] Phone: [phone]   Fax:  [phone]  Physical Therapy Treatment  Patient Details  Name: Russell Russell MRN: [id number] Date of Birth: February 26, 1951 Referring Provider (PT): Lauraine Rinne, PA-C   Encounter Date: 10/30/2019   PT End of Session - 10/30/19 1105    Visit Number 22    Number of Visits 31    Date for PT Re-Evaluation 12/30/19   new POC for 8 weeks, Cert for 90 days   Authorization Type BCBS (follow medicare guidelines, 10th visit PN)    Progress Note Due on Visit 20    PT Start Time 1102    PT Stop Time 1145    PT Time Calculation (min) 43 min    Equipment Utilized During Treatment Gait belt    Activity Tolerance Patient tolerated treatment well    Behavior During Therapy WFL for tasks assessed/performed           Past Medical History:  Diagnosis Date  . Alcohol abuse    drinks 1 gallon of Gin every weekend, nothing during the week x >15 yrs  . Elevated transaminase level    +elev bili: abd u/s 06/2014 showed stable small hepatic hemangiomas, o/w normal.  . Essential hypertension   . GERD (gastroesophageal reflux disease)   . Hyperlipidemia 03/2014   Atorv started-chol improved  . Impaired fasting glucose 03/2014  . Nephrolithiasis   . PUD (peptic ulcer disease)   . Stroke ([place])   . Tobacco dependence    chantix: "psych effects"    Past Surgical History:  Procedure Laterality Date  . COLONOSCOPY  2005   Recall 10 yrs ([place])  . LOOP RECORDER INSERTION N/A 07/03/2019   Procedure: LOOP RECORDER INSERTION;  Surgeon: Thompson Grayer, MD;  Location: [facility];  Service: Cardiovascular;  Laterality: N/A;  . removal of kidney stones  2006   cystoscopic--removed from ureter.  No prob since.    There were no vitals filed for this visit.   Subjective Assessment - 10/30/19 1104    Subjective No new complaints. No falls or pain to  report. Reports walking around house with spouse. HEP is going well at home. Has been walking from car<>house. Has not tried walking into any other places, using wheelchair.    Patient is accompained by: Family member    Pertinent History PMH: HTN, alcohol abuse (ongoing use), HLD, GERD, PUD, tobacco use    Limitations Standing;Walking;House hold activities    Patient Stated Goals return to prior level of function, walk, "wheelchair is not apart of me" (wants to get out of the w/c)    Currently in Pain? No/denies                 [facility] Adult PT Treatment/Exercise - 10/30/19 1106      Transfers   Transfers Sit to Stand;Stand to Sit    Sit to Stand 5: Supervision;With upper extremity assist;From chair/3-in-1;From bed    Sit to Stand Details Verbal cues for sequencing;Verbal cues for precautions/safety;Verbal cues for safe use of DME/AE    Sit to Stand Details (indicate cue type and reason) cues to scoot closer to edge and for hand placement    Stand to Sit 5: Supervision;With upper extremity assist;To bed;To chair/3-in-1    Stand to Sit Details (indicate cue type and reason) Verbal cues for sequencing;Verbal cues for technique;Verbal cues for safe use of DME/AE    Stand to Sit  Details cues to turn completely toward surface and to reach back with sitting down       Ambulation/Gait   Ambulation/Gait Yes    Ambulation/Gait Assistance 4: Min guard;4: Min assist    Ambulation/Gait Assistance Details pt with multiple episodes of left foot catching with gait, this increased on outdoor surfaces. PTA provided facilitation to weight shift more to right in stance,  with improved foot clearance noted at times. then added simulated toe cap to left foot with gait indoors with dance sock with improved foot clearance with gait noted with each step. pt noted to veer toward left side of walker, therefore added colored bands at bottom of walker for target for stepping. with cues to step "right foot to red"  and "left foot to yellow" walker position improved, as did step placement.      Ambulation Distance (Feet) 250 Feet   x1 in/outdoors, 115 x2 indoors   Assistive device Rolling walker    Gait Pattern Step-through pattern;Step-to pattern;Decreased stride length;Decreased stance time - right;Decreased step length - left    Ambulation Surface Level;Indoor;Unlevel;Outdoor    Curb Other (comment)   min guard assist   Curb Details (indicate cue type and reason) with RW on outdoor curb with reminder cues on sequencing. pt able to self advance walker down/up curb with min guard assist for balance.                     PT Short Term Goals - 10/27/19 1116      PT SHORT TERM GOAL #1   Title Patient will demonstrate ability to ambulate >400 ft on indoor level surfaces with LRAD and CGA to demonstrate improved mobility    Baseline 422 ft, Min Guard    Time 4    Period Weeks    Status Achieved    Target Date 10/29/19      PT SHORT TERM GOAL #2   Title Patient will demonstrate ability to ascend/descend 1 curb with [employer] and RW to demo improved entrance/exit of home    Baseline Patient able to ascend/descend single curb with CGA/Min Guard and RW.    Time 4    Period Weeks    Status Achieved    Target Date 10/29/19             PT Long Term Goals - 10/01/19 1237      PT LONG TERM GOAL #1   Title Patient will be independent with Final HEP, with wifes assistance (ALL LTG's Due: 11/26/2019)    Baseline Continue to progress HEP at this time    Time 8    Period Weeks    Status On-going    Target Date 11/26/19      PT LONG TERM GOAL #2   Title Patient will demonstrate ability to complete all transfers, Mod I, LRAD to demonstrate improved independence with functional mobility    Baseline CGA on 7/22    Time 8    Period Weeks    Status New    Target Date 11/26/19      PT LONG TERM GOAL #3   Title Patient will demo ability to ambulate >200 ft on outdoor/unlevel surfaces to demo  improved community mobility    Baseline not yet assessed    Time 8    Period Weeks    Status New    Target Date 11/26/19      PT LONG TERM GOAL #4   Title Patient will demonstrate  ability to complete sit <> stand, Mod I, w/ LRAD to demonstrate improved functional mobility    Baseline Supv Level 7/22    Time 8    Period Weeks    Status Revised    Target Date 11/26/19      PT LONG TERM GOAL #5   Title Patient will demo ability to ascend/descend curb with LRAD and Supervision to demonstrate improved ability to enter/exit home    Baseline Min A    Time 8    Period Weeks    Status New    Target Date 11/26/19                 Plan - 10/30/19 1106    Clinical Impression Statement Today's skilled session continued to focus on gait on various surfaces and fine tuning gait mechanics. Pt clears left foot better with simulated toe cap. Pt and spouse provided with information on how to get one on his shoe, [facility] number. Pt also demo's improved step placement with use of colored bands on bottom of walker with improved positioning in walker. Band sent home for pt to use at home with gait. The pt is progressing well towards goals and should benefit from continued PT to progress toward unmet goals.    Personal Factors and Comorbidities Comorbidity 3+    Comorbidities HTN, alcohol abuse (ongoing use), HLD, GERD, PUD, tobacco use    Examination-Activity Limitations Bed Mobility;Dressing;Locomotion Level;Stairs;Stand;Toileting;Transfers;Bathing    Examination-Participation Restrictions Driving;Interpersonal Relationship;Yard Work    [employer] Evolving/Moderate complexity    Rehab Potential Good    PT Frequency 2x / week    PT Duration 8 weeks    PT Treatment/Interventions ADLs/Self Care Home Management;Electrical Stimulation;DME Instruction;Moist Heat;Cryotherapy;Gait training;Stair training;Functional mobility training;Therapeutic activities;Therapeutic  exercise;Balance training;Neuromuscular re-education;Patient/family education;Orthotic Fit/Training;Wheelchair mobility training;Passive range of motion    PT Next Visit Plan Continued gait training. NMR/weight shifting; curb training: static standing balance exercises    Consulted and Agree with Plan of Care Patient;Family member/caregiver    Family Member Consulted Wife           Patient will benefit from skilled therapeutic intervention in order to improve the following deficits and impairments:  Abnormal gait, Decreased coordination, Difficulty walking, Impaired tone, Decreased safety awareness, Decreased endurance, Decreased activity tolerance, Pain, Decreased balance, Decreased knowledge of use of DME, Postural dysfunction, Impaired sensation, Decreased strength, Decreased mobility  Visit Diagnosis: Hemiplegia and hemiparesis following cerebral infarction affecting left dominant side (HCC)  Unsteadiness on feet  Muscle weakness (generalized)  Other abnormalities of gait and mobility     Problem List Patient Active Problem List   Diagnosis Date Noted  . Abnormality of gait 09/24/2019  . AKI (acute kidney injury) ([place])   . Fever   . Sepsis without acute organ dysfunction ([place])   . Elevated BUN   . Blood pressure increase diastolic   . Labile blood pressure   . Leukopenia   . Benign essential HTN   . Hypoalbuminemia   . Cerebrovascular accident (CVA) of right basal ganglia ([place]) 07/07/2019  . Acute ischemic stroke ([place])   . Dyslipidemia   . Dysphagia, post-stroke   . Polycythemia   . Stroke ([place]) 06/30/2019  . Prediabetes 12/23/2016  . Elevated LFTs 12/23/2016  . Pure hypercholesterolemia 12/21/2016  . Alcoholism ([place]) 12/21/2016  . Eczema 09/18/2013  . Essential hypertension 09/18/2013  . Gastroesophageal reflux disease without esophagitis 09/18/2013  . Tobacco abuse 09/18/2013    [name], PTA, CLT Outpatient Neuro  [facility] [address] [place], [place] [address] [phone] 10/30/19, 4:41 PM   Name: Dantre Yearwood MRN: [id number] Date of Birth: 10/20/1950

## 2019-10-30 NOTE — Therapy (Deleted)
[facility] [address] [place], [name], [address] Phone: [phone]   Fax:  [phone]  Patient Details  Name: Russell Russell MRN: [id number] Date of Birth: 05-16-1950 Referring Provider:  Inda Coke, PA  Encounter Date: 10/30/2019   [name] 10/30/2019, 11:42 AM  [facility] [address] [place], [name], [address] Phone: [phone]   Fax:  [phone]

## 2019-11-03 ENCOUNTER — Ambulatory Visit: Payer: BC Managed Care – PPO | Admitting: Occupational Therapy

## 2019-11-03 ENCOUNTER — Other Ambulatory Visit: Payer: Self-pay

## 2019-11-03 ENCOUNTER — Ambulatory Visit: Payer: BC Managed Care – PPO

## 2019-11-03 ENCOUNTER — Encounter: Payer: BC Managed Care – PPO | Admitting: Speech Pathology

## 2019-11-03 DIAGNOSIS — R2681 Unsteadiness on feet: Secondary | ICD-10-CM

## 2019-11-03 DIAGNOSIS — R2689 Other abnormalities of gait and mobility: Secondary | ICD-10-CM

## 2019-11-03 DIAGNOSIS — I69352 Hemiplegia and hemiparesis following cerebral infarction affecting left dominant side: Secondary | ICD-10-CM

## 2019-11-03 DIAGNOSIS — R208 Other disturbances of skin sensation: Secondary | ICD-10-CM

## 2019-11-03 DIAGNOSIS — M6281 Muscle weakness (generalized): Secondary | ICD-10-CM

## 2019-11-03 DIAGNOSIS — R482 Apraxia: Secondary | ICD-10-CM

## 2019-11-03 DIAGNOSIS — R278 Other lack of coordination: Secondary | ICD-10-CM

## 2019-11-03 NOTE — Therapy (Signed)
[facility] [address] [place], [name], [address] Phone: [phone]   Fax:  [phone]  Occupational Therapy Treatment  Patient Details  Name: Russell Russell MRN: [id number] Date of Birth: 29-May-1950 Referring Provider (OT): [name]   Encounter Date: 11/03/2019   OT End of Session - 11/03/19 1541    Visit Number 14    Number of Visits 25    Date for OT Re-Evaluation 12/04/19    Authorization Type BCBS? "Follow Medicare Guidelines"    Authorization - Number of Visits 13    Progress Note Due on Visit 19    OT Start Time 1450    OT Stop Time 1530    OT Time Calculation (min) 40 min    Activity Tolerance Patient tolerated treatment well    Behavior During Therapy [facility] for tasks assessed/performed           Past Medical History:  Diagnosis Date  . Alcohol abuse    drinks 1 gallon of Gin every weekend, nothing during the week x >15 yrs  . Elevated transaminase level    +elev bili: abd u/s 06/2014 showed stable small hepatic hemangiomas, o/w normal.  . Essential hypertension   . GERD (gastroesophageal reflux disease)   . Hyperlipidemia 03/2014   Atorv started-chol improved  . Impaired fasting glucose 03/2014  . Nephrolithiasis   . PUD (peptic ulcer disease)   . Stroke ([place])   . Tobacco dependence    chantix: "psych effects"    Past Surgical History:  Procedure Laterality Date  . COLONOSCOPY  2005   Recall 10 yrs ([place])  . LOOP RECORDER INSERTION N/A 07/03/2019   Procedure: LOOP RECORDER INSERTION;  Surgeon: Thompson Grayer, MD;  Location: [facility];  Service: Cardiovascular;  Laterality: N/A;  . removal of kidney stones  2006   cystoscopic--removed from ureter.  No prob since.    There were no vitals filed for this visit.   Subjective Assessment - 11/03/19 1548    Subjective  Denies pain    Patient is accompanied by: Family member    Pertinent History HTN, ETOH use, smoker, loop  recorder    Currently in Pain? No/denies                  Treatment: Standing to perform mid-high reach to place graded clothespins on vertical antennae with LUE min facilitation/ v.c for weight shift and functional reach, min v.c for hand placement Seated placing medium pegs in pegboard with LUE mod difficulty, v.c for in hand manipulation and positioning, increased time required                 OT Short Term Goals - 10/22/19 1555      OT SHORT TERM GOAL #1   Title Patient will compelte a home exercise program designed to improve functional use of LUE with mod cueing and set up due 09/05/19    Status Achieved      OT [place] #2   Title Patient will don a front opening shirt with fasteners (buttons/zipper) with mod assist    Status Achieved      OT SHORT TERM GOAL #3   Title Patient will demonstrate ability to cut food on plate with min assist    Status Achieved      OT SHORT TERM GOAL #4   Title Patient will demonstrate low reach, grasp, release of lightweight small object (less than 1 lb) with min assist  Status Achieved      OT SHORT TERM GOAL #5   Title Patient will ambulate into bathroom and cmplete toileting with supervision and verbal cueing assistance due 11/04/19    Time 4    Period Weeks    Status New    Target Date 11/04/19      OT SHORT TERM GOAL #6   Title Patient will transport necessary items for bathing and dressing into bathroom to encourage greater independence with showering.    Time 4    Period Weeks    Status New             OT Long Term Goals - 10/22/19 1555      OT LONG TERM GOAL #1   Title Patient will complete an updated HEP to address LUE functioning due 10/05/19    Status On-going      OT LONG TERM GOAL #2   Title Patient will walk with assistive device into bathroom to use toilet with min assist    Status Achieved      OT LONG TERM GOAL #3   Title Patient will transfer onto shower seat with min assist     Status Achieved      OT LONG TERM GOAL #4   Title Patient will obtain and release a 2-3 lb object at chest height shelf using LUE and min assist    Status Achieved      OT LONG TERM GOAL #5   Title Patient will toilet himself with modified independence due 12/04/19    Time 8    Period Weeks    Status New      OT LONG TERM GOAL #6   Title Patient will shower self with supervision/set up assistance    Time 8    Period Weeks    Status New      OT LONG TERM GOAL #7   Title Patient will grocery shop with wife, and will locate and retrieve at least 5 items from list with only minimal assistance    Time 8    Period Weeks    Status New                 Plan - 11/03/19 1542    Clinical Impression Statement Pt is progressing towards goals with improving LUE functional use.    OT Occupational Profile and History Detailed Assessment- Review of Records and additional review of physical, cognitive, psychosocial history related to current functional performance    Occupational performance deficits (Please refer to evaluation for details): ADL's;IADL's;Rest and Sleep;Leisure    Body Structure / Function / Physical Skills ADL;Coordination;Endurance;GMC;UE functional use;Sensation;Decreased knowledge of precautions;Balance;Body mechanics;Decreased knowledge of use of DME;Flexibility;IADL;Pain;Vision;Cardiopulmonary status limiting activity;Dexterity;FMC;Proprioception;Strength;Tone;ROM    Cognitive Skills Attention;Orientation;Thought;Perception;Problem Solve;Safety Awareness;Sequencing;Memory;Learn    Psychosocial Skills Habits;Routines and Behaviors    Rehab Potential Good    Clinical Decision Making Several treatment options, min-mod task modification necessary    Comorbidities Affecting Occupational Performance: May have comorbidities impacting occupational performance    Modification or Assistance to Complete Evaluation  Min-Moderate modification of tasks or assist with assess necessary  to complete eval    OT Frequency 2x / week    OT Duration 8 weeks    OT Treatment/Interventions Self-care/ADL training;Electrical Stimulation;Therapeutic exercise;Visual/perceptual remediation/compensation;Patient/family education;Splinting;Neuromuscular education;Moist Heat;Aquatic Therapy;Balance training;Therapeutic activities;Functional Mobility Training;Fluidtherapy;DME and/or AE instruction;Manual Therapy;Cognitive remediation/compensation    Plan continue to address functional mobility and mid to high range functional reach    OT Home Exercise Plan coordination HEP  Consulted and Agree with Plan of Care Patient;Family member/caregiver    Family Member Consulted Wife, [name]           Patient will benefit from skilled therapeutic intervention in order to improve the following deficits and impairments:   Body Structure / Function / Physical Skills: ADL, Coordination, Endurance, GMC, UE functional use, Sensation, Decreased knowledge of precautions, Balance, Body mechanics, Decreased knowledge of use of DME, Flexibility, IADL, Pain, Vision, Cardiopulmonary status limiting activity, Dexterity, FMC, Proprioception, Strength, Tone, ROM Cognitive Skills: Attention, Orientation, Thought, Perception, Problem Solve, Safety Awareness, Sequencing, Memory, Learn Psychosocial Skills: Habits, Routines and Behaviors   Visit Diagnosis: Hemiplegia and hemiparesis following cerebral infarction affecting left dominant side (HCC)  Unsteadiness on feet  Muscle weakness (generalized)  Other lack of coordination  Other disturbances of skin sensation  Apraxia    Problem List Patient Active Problem List   Diagnosis Date Noted  . Abnormality of gait 09/24/2019  . AKI (acute kidney injury) ([place])   . Fever   . Sepsis without acute organ dysfunction ([place])   . Elevated BUN   . Blood pressure increase diastolic   . Labile blood pressure   . Leukopenia   . Benign essential HTN   . Hypoalbuminemia    . Cerebrovascular accident (CVA) of right basal ganglia ([place]) 07/07/2019  . Acute ischemic stroke ([place])   . Dyslipidemia   . Dysphagia, post-stroke   . Polycythemia   . Stroke ([place]) 06/30/2019  . Prediabetes 12/23/2016  . Elevated LFTs 12/23/2016  . Pure hypercholesterolemia 12/21/2016  . Alcoholism ([place]) 12/21/2016  . Eczema 09/18/2013  . Essential hypertension 09/18/2013  . Gastroesophageal reflux disease without esophagitis 09/18/2013  . Tobacco abuse 09/18/2013    Radley Barto 11/03/2019, 3:48 PM  [facility] [address] [place], [name], [address] Phone: [phone]   Fax:  [phone]  Name: Nolawi Kanady MRN: [id number] Date of Birth: 1950/11/24

## 2019-11-03 NOTE — Therapy (Signed)
[facility] [address] [place], [name], [address] Phone: [phone]   Fax:  [phone]  Physical Therapy Treatment  Patient Details  Name: Russell Russell MRN: [id number] Date of Birth: 03/10/1951 Referring Provider (PT): Lauraine Rinne, PA-C   Encounter Date: 11/03/2019   PT End of Session - 11/03/19 1432    Visit Number 23    Number of Visits 31    Date for PT Re-Evaluation 12/30/19   new POC for 8 weeks, Cert for 90 days   Authorization Type BCBS (follow medicare guidelines, 10th visit PN)    Progress Note Due on Visit 20    PT Start Time 1405    PT Stop Time 1445    PT Time Calculation (min) 40 min    Equipment Utilized During Treatment Gait belt    Activity Tolerance Patient tolerated treatment well    Behavior During Therapy [facility] for tasks assessed/performed           Past Medical History:  Diagnosis Date  . Alcohol abuse    drinks 1 gallon of Gin every weekend, nothing during the week x >15 yrs  . Elevated transaminase level    +elev bili: abd u/s 06/2014 showed stable small hepatic hemangiomas, o/w normal.  . Essential hypertension   . GERD (gastroesophageal reflux disease)   . Hyperlipidemia 03/2014   Atorv started-chol improved  . Impaired fasting glucose 03/2014  . Nephrolithiasis   . PUD (peptic ulcer disease)   . Stroke ([place])   . Tobacco dependence    chantix: "psych effects"    Past Surgical History:  Procedure Laterality Date  . COLONOSCOPY  2005   Recall 10 yrs ([place])  . LOOP RECORDER INSERTION N/A 07/03/2019   Procedure: LOOP RECORDER INSERTION;  Surgeon: Thompson Grayer, MD;  Location: [facility];  Service: Cardiovascular;  Laterality: N/A;  . removal of kidney stones  2006   cystoscopic--removed from ureter.  No prob since.    There were no vitals filed for this visit.   Gait training:' 1 x 207' with RW, with 3lb on L ankle, cues to pick up L LE during swing phase  CGA to min A required during turns With use of countertop on R UE: walking fwd and bwd: 10 steps, pt required bil UE support with walking bwd Walking laterally stepping over 4x yard sticks: 4x each way.  Standing with WIDE BOS: reaching fwd with R and L UE: 5x, reaching laterally with ipsilateral UE: 5x R and L; reaching contralaterally UE: 5x R and L  Static standing with side stepping with ipsilateral leg: 10x R and L, disc placed on floor to prevent dragging foot and pt has to pick up foot, no HHA, min A occaisonally for LOB   Ambulated 1 x 115' with RW and no weight on ankle, pt was very fatigued at this point and difficulty with picking up his L LEG at end of the session, especially during the turns.                          PT Short Term Goals - 10/27/19 1116      PT SHORT TERM GOAL #1   Title Patient will demonstrate ability to ambulate >400 ft on indoor level surfaces with LRAD and CGA to demonstrate improved mobility    Baseline 422 ft, Min Guard    Time 4    Period Weeks  Status Achieved    Target Date 10/29/19      PT SHORT TERM GOAL #2   Title Patient will demonstrate ability to ascend/descend 1 curb with Min Guard and RW to demo improved entrance/exit of home    Baseline Patient able to ascend/descend single curb with CGA/Min Guard and RW.    Time 4    Period Weeks    Status Achieved    Target Date 10/29/19             PT Long Term Goals - 10/01/19 1237      PT LONG TERM GOAL #1   Title Patient will be independent with Final HEP, with wifes assistance (ALL LTG's Due: 11/26/2019)    Baseline Continue to progress HEP at this time    Time 8    Period Weeks    Status On-going    Target Date 11/26/19      PT LONG TERM GOAL #2   Title Patient will demonstrate ability to complete all transfers, Mod I, LRAD to demonstrate improved independence with functional mobility    Baseline CGA on 7/22    Time 8    Period Weeks    Status New     Target Date 11/26/19      PT LONG TERM GOAL #3   Title Patient will demo ability to ambulate >200 ft on outdoor/unlevel surfaces to demo improved community mobility    Baseline not yet assessed    Time 8    Period Weeks    Status New    Target Date 11/26/19      PT LONG TERM GOAL #4   Title Patient will demonstrate ability to complete sit <> stand, Mod I, w/ LRAD to demonstrate improved functional mobility    Baseline Supv Level 7/22    Time 8    Period Weeks    Status Revised    Target Date 11/26/19      PT LONG TERM GOAL #5   Title Patient will demo ability to ascend/descend curb with LRAD and Supervision to demonstrate improved ability to enter/exit home    Baseline Min A    Time 8    Period Weeks    Status New    Target Date 11/26/19                 Plan - 11/03/19 1452    Clinical Impression Statement Pt continues to demonstrated poor safety awareness with sit to stand transfers. He still requires cueing for proper hand placement fo 25% of the time. Patient has tendency to approcah sitting surface diagonally and needs verbal cueing to keep turning until both legs are touching chair and then reaching with arms before slowly controlling descent. Patient tends to stand towards the inner left inside the walker which increases chances of him hitting the walker leg with left foot during ambulation. This is due to L UE weakness.    Personal Factors and Comorbidities Comorbidity 3+    Comorbidities HTN, alcohol abuse (ongoing use), HLD, GERD, PUD, tobacco use    Examination-Activity Limitations Bed Mobility;Dressing;Locomotion Level;Stairs;Stand;Toileting;Transfers;Bathing    Examination-Participation Restrictions Driving;Interpersonal Relationship;Yard Work    [employer] Evolving/Moderate complexity    Rehab Potential Good    PT Frequency 2x / week    PT Duration 8 weeks    PT Treatment/Interventions ADLs/Self Care Home Management;Electrical  Stimulation;DME Instruction;Moist Heat;Cryotherapy;Gait training;Stair training;Functional mobility training;Therapeutic activities;Therapeutic exercise;Balance training;Neuromuscular re-education;Patient/family education;Orthotic Fit/Training;Wheelchair mobility training;Passive range of motion  PT Next Visit Plan Continued gait training. NMR/weight shifting; curb training: static standing balance exercises    Consulted and Agree with Plan of Care Patient;Family member/caregiver    Family Member Consulted Wife           Patient will benefit from skilled therapeutic intervention in order to improve the following deficits and impairments:  Abnormal gait, Decreased coordination, Difficulty walking, Impaired tone, Decreased safety awareness, Decreased endurance, Decreased activity tolerance, Pain, Decreased balance, Decreased knowledge of use of DME, Postural dysfunction, Impaired sensation, Decreased strength, Decreased mobility  Visit Diagnosis: Hemiplegia and hemiparesis following cerebral infarction affecting left dominant side (HCC)  Unsteadiness on feet  Muscle weakness (generalized)  Other abnormalities of gait and mobility     Problem List Patient Active Problem List   Diagnosis Date Noted  . Abnormality of gait 09/24/2019  . AKI (acute kidney injury) ([place])   . Fever   . Sepsis without acute organ dysfunction ([place])   . Elevated BUN   . Blood pressure increase diastolic   . Labile blood pressure   . Leukopenia   . Benign essential HTN   . Hypoalbuminemia   . Cerebrovascular accident (CVA) of right basal ganglia ([place]) 07/07/2019  . Acute ischemic stroke ([place])   . Dyslipidemia   . Dysphagia, post-stroke   . Polycythemia   . Stroke ([place]) 06/30/2019  . Prediabetes 12/23/2016  . Elevated LFTs 12/23/2016  . Pure hypercholesterolemia 12/21/2016  . Alcoholism ([place]) 12/21/2016  . Eczema 09/18/2013  . Essential hypertension 09/18/2013  . Gastroesophageal reflux disease  without esophagitis 09/18/2013  . Tobacco abuse 09/18/2013    Kerrie Pleasure 11/03/2019, 2:55 PM  [facility] [address] [place], [name], [address] Phone: [phone]   Fax:  [phone]  Name: Russell Russell MRN: [id number] Date of Birth: 11/23/1950

## 2019-11-05 ENCOUNTER — Ambulatory Visit: Payer: BC Managed Care – PPO | Admitting: Occupational Therapy

## 2019-11-05 ENCOUNTER — Other Ambulatory Visit: Payer: Self-pay

## 2019-11-05 ENCOUNTER — Ambulatory Visit: Payer: BC Managed Care – PPO

## 2019-11-05 DIAGNOSIS — R278 Other lack of coordination: Secondary | ICD-10-CM

## 2019-11-05 DIAGNOSIS — M6281 Muscle weakness (generalized): Secondary | ICD-10-CM

## 2019-11-05 DIAGNOSIS — R2681 Unsteadiness on feet: Secondary | ICD-10-CM

## 2019-11-05 DIAGNOSIS — R41840 Attention and concentration deficit: Secondary | ICD-10-CM

## 2019-11-05 DIAGNOSIS — R208 Other disturbances of skin sensation: Secondary | ICD-10-CM

## 2019-11-05 DIAGNOSIS — I69352 Hemiplegia and hemiparesis following cerebral infarction affecting left dominant side: Secondary | ICD-10-CM

## 2019-11-05 DIAGNOSIS — R482 Apraxia: Secondary | ICD-10-CM

## 2019-11-05 NOTE — Therapy (Signed)
[facility] [address] [place], [name], [address] Phone: [phone]   Fax:  [phone]  Occupational Therapy Treatment  Patient Details  Name: Russell Russell MRN: [id number] Date of Birth: 08/16/1950 Referring Provider (OT): [name]   Encounter Date: 11/05/2019   OT End of Session - 11/05/19 1419    Visit Number 15    Number of Visits 25    Date for OT Re-Evaluation 12/04/19    Authorization Type BCBS? "Follow Medicare Guidelines"    Authorization - Number of Visits 15    Progress Note Due on Visit 79    OT Start Time 1315    OT Stop Time 1400    OT Time Calculation (min) 45 min    Activity Tolerance Patient tolerated treatment well    Behavior During Therapy [facility] for tasks assessed/performed           Past Medical History:  Diagnosis Date  . Alcohol abuse    drinks 1 gallon of Gin every weekend, nothing during the week x >15 yrs  . Elevated transaminase level    +elev bili: abd u/s 06/2014 showed stable small hepatic hemangiomas, o/w normal.  . Essential hypertension   . GERD (gastroesophageal reflux disease)   . Hyperlipidemia 03/2014   Atorv started-chol improved  . Impaired fasting glucose 03/2014  . Nephrolithiasis   . PUD (peptic ulcer disease)   . Stroke ([place])   . Tobacco dependence    chantix: "psych effects"    Past Surgical History:  Procedure Laterality Date  . COLONOSCOPY  2005   Recall 10 yrs ([place])  . LOOP RECORDER INSERTION N/A 07/03/2019   Procedure: LOOP RECORDER INSERTION;  Surgeon: Thompson Grayer, MD;  Location: [facility];  Service: Cardiovascular;  Laterality: N/A;  . removal of kidney stones  2006   cystoscopic--removed from ureter.  No prob since.    There were no vitals filed for this visit.                 OT Treatments/Exercises (OP) - 11/05/19 1404      ADLs   Toileting Patient has independently toileted himself, without incidence  once when wife not home.  This has not happened consistently.      Bathing Patient is now showering at home.  Wife provides contact assistance.  Encouraged wife to attempt to try to provide less physical contact and try to provide cueing /supervision.  Wife and patient in agreement.        Modalities   Modalities Fluidotherapy      LUE Fluidotherapy   Number Minutes Fluidotherapy 8 Minutes    LUE Fluidotherapy Location Wrist;Hand;Forearm    Comments prior to mobilization      Manual Therapy   Manual therapy comments Patient with inconsistent wrist pain with wrist extension - and     Joint Mobilization Carpal mobilization to allow increased range of motion and decrease wrist pain.                    OT Education - 11/05/19 1419    Education Details reducing physical assistance with showering as able    Person(s) Educated Patient;Spouse    Methods Explanation    Comprehension Verbalized understanding            OT Short Term Goals - 11/05/19 1316      OT SHORT TERM GOAL #1   Title Patient will compelte a home exercise program designed  to improve functional use of LUE with mod cueing and set up due 09/05/19    Time 4    Period Weeks    Status Achieved    Target Date 09/05/19      OT SHORT TERM GOAL #2   Title Patient will don a front opening shirt with fasteners (buttons/zipper) with mod assist    Time 4    Period Weeks    Status Achieved      OT SHORT TERM GOAL #3   Title Patient will demonstrate ability to cut food on plate with min assist    Time 4    Period Weeks    Status Achieved      OT SHORT TERM GOAL #4   Title Patient will demonstrate low reach, grasp, release of lightweight small object (less than 1 lb) with min assist    Time 4    Period Weeks    Status Achieved      OT SHORT TERM GOAL #5   Title Patient will ambulate into bathroom and cmplete toileting with supervision and verbal cueing assistance due 11/04/19    Time 4    Period Weeks     Status Achieved      OT SHORT TERM GOAL #6   Title Patient will transport necessary items for bathing and dressing into bathroom to encourage greater independence with showering.    Status On-going             OT Long Term Goals - 10/22/19 1555      OT LONG TERM GOAL #1   Title Patient will complete an updated HEP to address LUE functioning due 10/05/19    Status On-going      OT LONG TERM GOAL #2   Title Patient will walk with assistive device into bathroom to use toilet with min assist    Status Achieved      OT LONG TERM GOAL #3   Title Patient will transfer onto shower seat with min assist    Status Achieved      OT LONG TERM GOAL #4   Title Patient will obtain and release a 2-3 lb object at chest height shelf using LUE and min assist    Status Achieved      OT LONG TERM GOAL #5   Title Patient will toilet himself with modified independence due 12/04/19    Time 8    Period Weeks    Status New      OT LONG TERM GOAL #6   Title Patient will shower self with supervision/set up assistance    Time 8    Period Weeks    Status New      OT LONG TERM GOAL #7   Title Patient will grocery shop with wife, and will locate and retrieve at least 5 items from list with only minimal assistance    Time 8    Period Weeks    Status New                 Plan - 11/05/19 1420    Clinical Impression Statement Patient has met all but one short term goal, and is progressing toward long term goals.    OT Occupational Profile and History Detailed Assessment- Review of Records and additional review of physical, cognitive, psychosocial history related to current functional performance    Occupational performance deficits (Please refer to evaluation for details): ADL's;IADL's;Rest and Sleep;Leisure    Body Structure / Function / Physical  Skills ADL;Coordination;Endurance;GMC;UE functional use;Sensation;Decreased knowledge of precautions;Balance;Body mechanics;Decreased knowledge of use  of DME;Flexibility;IADL;Pain;Vision;Cardiopulmonary status limiting activity;Dexterity;FMC;Proprioception;Strength;Tone;ROM    Cognitive Skills Attention;Orientation;Thought;Perception;Problem Solve;Safety Awareness;Sequencing;Memory;Learn    Psychosocial Skills Habits;Routines and Behaviors    Rehab Potential Good    Clinical Decision Making Several treatment options, min-mod task modification necessary    Comorbidities Affecting Occupational Performance: May have comorbidities impacting occupational performance    Modification or Assistance to Complete Evaluation  Min-Moderate modification of tasks or assist with assess necessary to complete eval    OT Frequency 2x / week    OT Duration 8 weeks    OT Treatment/Interventions Self-care/ADL training;Electrical Stimulation;Therapeutic exercise;Visual/perceptual remediation/compensation;Patient/family education;Splinting;Neuromuscular education;Moist Heat;Aquatic Therapy;Balance training;Therapeutic activities;Functional Mobility Training;Fluidtherapy;DME and/or AE instruction;Manual Therapy;Cognitive remediation/compensation    Plan continue to address functional mobility and mid to high range functional reach    OT Home Exercise Plan coordination HEP    Consulted and Agree with Plan of Care Patient;Family member/caregiver    Family Member Consulted Wife, [name]           Patient will benefit from skilled therapeutic intervention in order to improve the following deficits and impairments:   Body Structure / Function / Physical Skills: ADL, Coordination, Endurance, GMC, UE functional use, Sensation, Decreased knowledge of precautions, Balance, Body mechanics, Decreased knowledge of use of DME, Flexibility, IADL, Pain, Vision, Cardiopulmonary status limiting activity, Dexterity, FMC, Proprioception, Strength, Tone, ROM Cognitive Skills: Attention, Orientation, Thought, Perception, Problem Solve, Safety Awareness, Sequencing, Memory,  Learn Psychosocial Skills: Habits, Routines and Behaviors   Visit Diagnosis: Hemiplegia and hemiparesis following cerebral infarction affecting left dominant side (HCC)  Unsteadiness on feet  Muscle weakness (generalized)  Other lack of coordination  Other disturbances of skin sensation  Apraxia  Attention and concentration deficit    Problem List Patient Active Problem List   Diagnosis Date Noted  . Abnormality of gait 09/24/2019  . AKI (acute kidney injury) ([place])   . Fever   . Sepsis without acute organ dysfunction ([place])   . Elevated BUN   . Blood pressure increase diastolic   . Labile blood pressure   . Leukopenia   . Benign essential HTN   . Hypoalbuminemia   . Cerebrovascular accident (CVA) of right basal ganglia ([place]) 07/07/2019  . Acute ischemic stroke ([place])   . Dyslipidemia   . Dysphagia, post-stroke   . Polycythemia   . Stroke ([place]) 06/30/2019  . Prediabetes 12/23/2016  . Elevated LFTs 12/23/2016  . Pure hypercholesterolemia 12/21/2016  . Alcoholism ([place]) 12/21/2016  . Eczema 09/18/2013  . Essential hypertension 09/18/2013  . Gastroesophageal reflux disease without esophagitis 09/18/2013  . Tobacco abuse 09/18/2013    Mariah Milling, OTR/L 11/05/2019, 2:23 PM  [facility] [address] [place], [name], [address] Phone: [phone]   Fax:  [phone]  Name: Russell Russell MRN: [id number] Date of Birth: 03/09/1951

## 2019-11-05 NOTE — Therapy (Signed)
[facility] [address] [place], [name], [address] Phone: [phone]   Fax:  [phone]  Physical Therapy Treatment  Patient Details  Name: Russell Russell MRN: [id number] Date of Birth: 1950-07-24 Referring Provider (PT): Lauraine Rinne, PA-C   Encounter Date: 11/05/2019   PT End of Session - 11/05/19 1239    Visit Number 24    Number of Visits 31    Date for PT Re-Evaluation 12/30/19   new POC for 8 weeks, Cert for 90 days   Authorization Type BC[employer] (follow medicare guidelines, 10th visit PN)    Progress Note Due on Visit 20    PT Start Time 1231    PT Stop Time 1314    PT Time Calculation (min) 43 min    Equipment Utilized During Treatment Gait belt    Activity Tolerance Patient tolerated treatment well    Behavior During Therapy WFL for tasks assessed/performed           Past Medical History:  Diagnosis Date  . Alcohol abuse    drinks 1 gallon of Gin every weekend, nothing during the week x >15 yrs  . Elevated transaminase level    +elev bili: abd u/s 06/2014 showed stable small hepatic hemangiomas, o/w normal.  . Essential hypertension   . GERD (gastroesophageal reflux disease)   . Hyperlipidemia 03/2014   Atorv started-chol improved  . Impaired fasting glucose 03/2014  . Nephrolithiasis   . PUD (peptic ulcer disease)   . Stroke ([place])   . Tobacco dependence    chantix: "psych effects"    Past Surgical History:  Procedure Laterality Date  . COLONOSCOPY  2005   Recall 10 yrs ([place])  . LOOP RECORDER INSERTION N/A 07/03/2019   Procedure: LOOP RECORDER INSERTION;  Surgeon: Thompson Grayer, MD;  Location: [facility];  Service: Cardiovascular;  Laterality: N/A;  . removal of kidney stones  2006   cystoscopic--removed from ureter.  No prob since.    There were no vitals filed for this visit.   Subjective Assessment - 11/05/19 1236    Subjective Patient has not got toe cap added to shoe  yet. wife plans to drop off the shoe soon.    Patient is accompained by: Family member    Pertinent History PMH: HTN, alcohol abuse (ongoing use), HLD, GERD, PUD, tobacco use    Limitations Standing;Walking;House hold activities    Patient Stated Goals return to prior level of function, walk, "wheelchair is not apart of me" (wants to get out of the w/c)    Currently in Pain? No/denies                             [facility] Adult PT Treatment/Exercise - 11/05/19 0001      Transfers   Transfers Sit to Stand;Stand to Sit    Sit to Stand 5: Supervision    Sit to Stand Details Verbal cues for sequencing;Verbal cues for precautions/safety;Verbal cues for safe use of DME/AE    Stand to Sit 5: Supervision;With upper extremity assist;To bed;To chair/3-in-1    Stand to Sit Details (indicate cue type and reason) Verbal cues for sequencing;Verbal cues for technique;Verbal cues for safe use of DME/AE      Ambulation/Gait   Ambulation/Gait Yes    Ambulation/Gait Assistance 4: Min guard    Ambulation/Gait Assistance Details completed gait training from patient personal car into waiting room to work on patient ability  to ambulate into/out of therapy session vs. utilizing wheelchair. patient able to complete with CGA. Completed gait training with RW x 115 ft, attempting to complete ambulation with looking forward rather than at feet, PT providing mirror for visual feedback to promote posture and looking forward.     Ambulation Distance (Feet) 100 Feet   115 x 1   Assistive device Rolling walker    Gait Pattern Step-through pattern;Step-to pattern;Decreased stride length;Decreased stance time - right;Decreased step length - left    Ambulation Surface Level;Indoor    Gait Comments Educated patient and wife on process for ambulating from car to waiting room with RW and CGA from wife. For improved ambulation into/out of clinic. Patient and wife agreeing, and plan to try this next session.        Neuro Re-ed    Neuro Re-ed Details  Standing on firm surface with RUE support from chair, completed fwd/backward steps over black beam with CGA. Completed alternating steps forward with BLE, 2 x 15 reps each. Increased difficutly with clearance of LLE, PT Providing verbal cues for upright posture and improved knee extension with stance on LLE. Completed alternating toe taps to colored pebbles, 1 x 10 reps with light UE support. Progressed to looking forward with completion to avoid heavy reliance on vision x 10 reps. Progressed to completing crossover toe taps x 10 reps. Patient toleraring all well, PT providing verbal cues throughout for improved weight shift to LLE.                     PT Short Term Goals - 10/27/19 1116      PT SHORT TERM GOAL #1   Title Patient will demonstrate ability to ambulate >400 ft on indoor level surfaces with LRAD and CGA to demonstrate improved mobility    Baseline 422 ft, Min Guard    Time 4    Period Weeks    Status Achieved    Target Date 10/29/19      PT SHORT TERM GOAL #2   Title Patient will demonstrate ability to ascend/descend 1 curb with [employer] and RW to demo improved entrance/exit of home    Baseline Patient able to ascend/descend single curb with CGA/Min Guard and RW.    Time 4    Period Weeks    Status Achieved    Target Date 10/29/19             PT Long Term Goals - 10/01/19 1237      PT LONG TERM GOAL #1   Title Patient will be independent with Final HEP, with wifes assistance (ALL LTG's Due: 11/26/2019)    Baseline Continue to progress HEP at this time    Time 8    Period Weeks    Status On-going    Target Date 11/26/19      PT LONG TERM GOAL #2   Title Patient will demonstrate ability to complete all transfers, Mod I, LRAD to demonstrate improved independence with functional mobility    Baseline CGA on 7/22    Time 8    Period Weeks    Status New    Target Date 11/26/19      PT LONG TERM GOAL #3   Title Patient  will demo ability to ambulate >200 ft on outdoor/unlevel surfaces to demo improved community mobility    Baseline not yet assessed    Time 8    Period Weeks    Status New    Target  Date 11/26/19      PT LONG TERM GOAL #4   Title Patient will demonstrate ability to complete sit <> stand, Mod I, w/ LRAD to demonstrate improved functional mobility    Baseline Supv Level 7/22    Time 8    Period Weeks    Status Revised    Target Date 11/26/19      PT LONG TERM GOAL #5   Title Patient will demo ability to ascend/descend curb with LRAD and Supervision to demonstrate improved ability to enter/exit home    Baseline Min A    Time 8    Period Weeks    Status New    Target Date 11/26/19                 Plan - 11/05/19 1419    Clinical Impression Statement Completed gait training outdoors from personal car into waiting area to allow for practice ambulating into the clinic. PT educating at next session, to begin to utilize personal RW to ambulate into/out of sessions for improved functional mobility. Continued to work on [employer] focused on coordination, lateral weight shift with reduced UE support. Will continue to progress toward goals.    Personal Factors and Comorbidities Comorbidity 3+    Comorbidities HTN, alcohol abuse (ongoing use), HLD, GERD, PUD, tobacco use    Examination-Activity Limitations Bed Mobility;Dressing;Locomotion Level;Stairs;Stand;Toileting;Transfers;Bathing    Examination-Participation Restrictions Driving;Interpersonal Relationship;Yard Work    [employer]chant [employer] officer Evolving/Moderate complexity    Rehab Potential Good    PT Frequency 2x / week    PT Duration 8 weeks    PT Treatment/Interventions ADLs/Self Care Home Management;Electrical Stimulation;DME Instruction;Moist Heat;Cryotherapy;Gait training;Stair training;Functional mobility training;Therapeutic activities;Therapeutic exercise;Balance training;Neuromuscular re-education;Patient/family  education;Orthotic Fit/Training;Wheelchair mobility training;Passive range of motion    PT Next Visit Plan Did we get toe cap? Continued gait training. NMR/weight shifting; curb training: static standing balance exercises    Consulted and Agree with Plan of Care Patient;Family member/caregiver    Family Member Consulted Wife           Patient will benefit from skilled therapeutic intervention in order to improve the following deficits and impairments:  Abnormal gait, Decreased coordination, Difficulty walking, Impaired tone, Decreased safety awareness, Decreased endurance, Decreased activity tolerance, Pain, Decreased balance, Decreased knowledge of use of DME, Postural dysfunction, Impaired sensation, Decreased strength, Decreased mobility  Visit Diagnosis: Hemiplegia and hemiparesis following cerebral infarction affecting left dominant side (HCC)  Unsteadiness on feet  Muscle weakness (generalized)  Other lack of coordination     Problem List Patient Active Problem List   Diagnosis Date Noted  . Abnormality of gait 09/24/2019  . AKI (acute kidney injury) ([place])   . Fever   . Sepsis without acute organ dysfunction ([place])   . Elevated BUN   . Blood pressure increase diastolic   . Labile blood pressure   . Leukopenia   . Benign essential HTN   . Hypoalbuminemia   . Cerebrovascular accident (CVA) of right basal ganglia ([place]) 07/07/2019  . Acute ischemic stroke ([place])   . Dyslipidemia   . Dysphagia, post-stroke   . Polycythemia   . Stroke ([place]) 06/30/2019  . Prediabetes 12/23/2016  . Elevated LFTs 12/23/2016  . Pure hypercholesterolemia 12/21/2016  . Alcoholism ([place]) 12/21/2016  . Eczema 09/18/2013  . Essential hypertension 09/18/2013  . Gastroesophageal reflux disease without esophagitis 09/18/2013  . Tobacco abuse 09/18/2013    Jones Bales, PT, DPT 11/05/2019, 2:22 PM  [facility] [address]  [place], [name], [address] Phone: [phone]   Fax:  [phone]  Name: Russell Russell MRN: [id number] Date of Birth: 09-05-1950

## 2019-11-10 ENCOUNTER — Ambulatory Visit: Payer: BC Managed Care – PPO

## 2019-11-10 ENCOUNTER — Ambulatory Visit: Payer: BC Managed Care – PPO | Admitting: Occupational Therapy

## 2019-11-10 ENCOUNTER — Encounter: Payer: BC Managed Care – PPO | Admitting: Speech Pathology

## 2019-11-10 ENCOUNTER — Encounter: Payer: Self-pay | Admitting: Occupational Therapy

## 2019-11-10 ENCOUNTER — Other Ambulatory Visit: Payer: Self-pay

## 2019-11-10 DIAGNOSIS — R41840 Attention and concentration deficit: Secondary | ICD-10-CM

## 2019-11-10 DIAGNOSIS — R278 Other lack of coordination: Secondary | ICD-10-CM

## 2019-11-10 DIAGNOSIS — R2681 Unsteadiness on feet: Secondary | ICD-10-CM | POA: Diagnosis not present

## 2019-11-10 DIAGNOSIS — R482 Apraxia: Secondary | ICD-10-CM

## 2019-11-10 DIAGNOSIS — R2689 Other abnormalities of gait and mobility: Secondary | ICD-10-CM

## 2019-11-10 DIAGNOSIS — M6281 Muscle weakness (generalized): Secondary | ICD-10-CM

## 2019-11-10 DIAGNOSIS — I69352 Hemiplegia and hemiparesis following cerebral infarction affecting left dominant side: Secondary | ICD-10-CM

## 2019-11-10 DIAGNOSIS — R208 Other disturbances of skin sensation: Secondary | ICD-10-CM

## 2019-11-10 NOTE — Therapy (Signed)
[facility] [address] [place], [name], [address] Phone: [phone]   Fax:  [phone]  Physical Therapy Treatment  Patient Details  Name: Russell Russell MRN: [id number] Date of Birth: 1950/04/28 Referring Provider (PT): Lauraine Rinne, PA-C   Encounter Date: 11/10/2019   PT End of Session - 11/10/19 1237    Visit Number 25    Number of Visits 31    Date for PT Re-Evaluation 12/30/19   new POC for 8 weeks, Cert for 90 days   Authorization Type BCBS (follow medicare guidelines, 10th visit PN)    Progress Note Due on Visit 20    PT Start Time 1232    PT Stop Time 1314    PT Time Calculation (min) 42 min    Equipment Utilized During Treatment Gait belt    Activity Tolerance Patient tolerated treatment well    Behavior During Therapy WFL for tasks assessed/performed           Past Medical History:  Diagnosis Date  . Alcohol abuse    drinks 1 gallon of Gin every weekend, nothing during the week x >15 yrs  . Elevated transaminase level    +elev bili: abd u/s 06/2014 showed stable small hepatic hemangiomas, o/w normal.  . Essential hypertension   . GERD (gastroesophageal reflux disease)   . Hyperlipidemia 03/2014   Atorv started-chol improved  . Impaired fasting glucose 03/2014  . Nephrolithiasis   . PUD (peptic ulcer disease)   . Stroke ([place])   . Tobacco dependence    chantix: "psych effects"    Past Surgical History:  Procedure Laterality Date  . COLONOSCOPY  2005   Recall 10 yrs ([place])  . LOOP RECORDER INSERTION N/A 07/03/2019   Procedure: LOOP RECORDER INSERTION;  Surgeon: Thompson Grayer, MD;  Location: [facility];  Service: Cardiovascular;  Laterality: N/A;  . removal of kidney stones  2006   cystoscopic--removed from ureter.  No prob since.    There were no vitals filed for this visit.   Subjective Assessment - 11/10/19 1236    Subjective Patient walked into therapy session, no  w/c today. Got toe cap added. Patient has not other complaints/concerns since last visit. No pain.    Patient is accompained by: Family member    Pertinent History PMH: HTN, alcohol abuse (ongoing use), HLD, GERD, PUD, tobacco use    Limitations Standing;Walking;House hold activities    Patient Stated Goals return to prior level of function, walk, "wheelchair is not apart of me" (wants to get out of the w/c)    Currently in Pain? No/denies                             [facility] Adult PT Treatment/Exercise - 11/10/19 0001      Transfers   Transfers Sit to Stand;Stand to Sit    Sit to Stand 5: Supervision    Sit to Stand Details Verbal cues for sequencing;Verbal cues for precautions/safety;Verbal cues for safe use of DME/AE    Stand to Sit 5: Supervision;With upper extremity assist;To bed;To chair/3-in-1    Stand to Sit Details (indicate cue type and reason) Verbal cues for sequencing;Verbal cues for technique;Verbal cues for safe use of DME/AE    Stand Pivot Transfers 5: Supervision    Comments completed x 10 reps throughout session with verbal cues for hand placement.       Ambulation/Gait   Ambulation/Gait Yes  Ambulation/Gait Assistance 4: Min guard    Ambulation/Gait Assistance Details completed ambulation with RW x 115 ft with toe cap patient demo improved clearance of LLE. followed ambulation x 115 ft with RW, with PT tech having mirror in front of patient with ambulation to allow for patient to look forward vs. down at feet.     Ambulation Distance (Feet) 115 Feet   x2   Assistive device Rolling walker    Gait Pattern Step-through pattern;Step-to pattern;Decreased stride length;Decreased stance time - right;Decreased step length - left    Ambulation Surface Level;Indoor    Gait Comments Completed ambulation from waiting room to mat in therapy gym with Min Guard from PT with completion.       Neuro Re-ed    Neuro Re-ed Details  Standing on airex pad: completed  standing balance activity without UE support and eyes open 3 x 30 seconds, progressed to eyes closed patient able to hold 15 seconds without UE support, completed x 3 reps as tolerated by patient. Progressed to completing forward reaching to target to multi direction with alternating UE to further add balance challenge and promote improved limits of stability completed x 15 reps. With BUE support from RW completed forward steps with LLE with 3# ankle weight x 10 reps. Then progressed to completing x 10 reps without weight with patient demo improved step length forward/backward.       Exercises   Exercises Other Exercises    Other Exercises  standing with BUE support from RW and 3# ankle weights on BLE, completed alternating marching x 10 reps with focus on controlled motion on LLE with completion.                     PT Short Term Goals - 10/27/19 1116      PT SHORT TERM GOAL #1   Title Patient will demonstrate ability to ambulate >400 ft on indoor level surfaces with LRAD and CGA to demonstrate improved mobility    Baseline 422 ft, Min Guard    Time 4    Period Weeks    Status Achieved    Target Date 10/29/19      PT SHORT TERM GOAL #2   Title Patient will demonstrate ability to ascend/descend 1 curb with [employer] and RW to demo improved entrance/exit of home    Baseline Patient able to ascend/descend single curb with CGA/Min Guard and RW.    Time 4    Period Weeks    Status Achieved    Target Date 10/29/19             PT Long Term Goals - 10/01/19 1237      PT LONG TERM GOAL #1   Title Patient will be independent with Final HEP, with wifes assistance (ALL LTG's Due: 11/26/2019)    Baseline Continue to progress HEP at this time    Time 8    Period Weeks    Status On-going    Target Date 11/26/19      PT LONG TERM GOAL #2   Title Patient will demonstrate ability to complete all transfers, Mod I, LRAD to demonstrate improved independence with functional mobility     Baseline CGA on 7/22    Time 8    Period Weeks    Status New    Target Date 11/26/19      PT LONG TERM GOAL #3   Title Patient will demo ability to ambulate >200 ft on outdoor/unlevel surfaces  to demo improved community mobility    Baseline not yet assessed    Time 8    Period Weeks    Status New    Target Date 11/26/19      PT LONG TERM GOAL #4   Title Patient will demonstrate ability to complete sit <> stand, Mod I, w/ LRAD to demonstrate improved functional mobility    Baseline Supv Level 7/22    Time 8    Period Weeks    Status Revised    Target Date 11/26/19      PT LONG TERM GOAL #5   Title Patient will demo ability to ascend/descend curb with LRAD and Supervision to demonstrate improved ability to enter/exit home    Baseline Min A    Time 8    Period Weeks    Status New    Target Date 11/26/19                 Plan - 11/10/19 1317    Clinical Impression Statement Patient was able to ambulate into/out of therapy building today with RW vs. being transported via w/c. Completed gait training today as patient was able to get toe cap placed on LLE to further help facilitate gait pattern, as well as use of mirror for visual feedback. Continued activities focused on improved weight shift and step length. Patient continue to require intermittent rest breaks throughout session. Will continue to progress toward goals.    Personal Factors and Comorbidities Comorbidity 3+    Comorbidities HTN, alcohol abuse (ongoing use), HLD, GERD, PUD, tobacco use    Examination-Activity Limitations Bed Mobility;Dressing;Locomotion Level;Stairs;Stand;Toileting;Transfers;Bathing    Examination-Participation Restrictions Driving;Interpersonal Relationship;Yard Work    [employer]chant [employer] officer Evolving/Moderate complexity    Rehab Potential Good    PT Frequency 2x / week    PT Duration 8 weeks    PT Treatment/Interventions ADLs/Self Care Home Management;Electrical Stimulation;DME  Instruction;Moist Heat;Cryotherapy;Gait training;Stair training;Functional mobility training;Therapeutic activities;Therapeutic exercise;Balance training;Neuromuscular re-education;Patient/family education;Orthotic Fit/Training;Wheelchair mobility training;Passive range of motion    PT Next Visit Plan Continued gait training working on looking forward vs down. NMR/weight shifting; curb training: static standing balance exercises    Consulted and Agree with Plan of Care Patient;Family member/caregiver    Family Member Consulted Wife           Patient will benefit from skilled therapeutic intervention in order to improve the following deficits and impairments:  Abnormal gait, Decreased coordination, Difficulty walking, Impaired tone, Decreased safety awareness, Decreased endurance, Decreased activity tolerance, Pain, Decreased balance, Decreased knowledge of use of DME, Postural dysfunction, Impaired sensation, Decreased strength, Decreased mobility  Visit Diagnosis: Hemiplegia and hemiparesis following cerebral infarction affecting left dominant side (HCC)  Unsteadiness on feet  Muscle weakness (generalized)  Other lack of coordination  Other abnormalities of gait and mobility     Problem List Patient Active Problem List   Diagnosis Date Noted  . Abnormality of gait 09/24/2019  . AKI (acute kidney injury) ([place])   . Fever   . Sepsis without acute organ dysfunction ([place])   . Elevated BUN   . Blood pressure increase diastolic   . Labile blood pressure   . Leukopenia   . Benign essential HTN   . Hypoalbuminemia   . Cerebrovascular accident (CVA) of right basal ganglia ([place]) 07/07/2019  . Acute ischemic stroke ([place])   . Dyslipidemia   . Dysphagia, post-stroke   . Polycythemia   . Stroke ([place]) 06/30/2019  . Prediabetes 12/23/2016  . Elevated LFTs 12/23/2016  .  Pure hypercholesterolemia 12/21/2016  . Alcoholism ([place]) 12/21/2016  . Eczema 09/18/2013  . Essential hypertension  09/18/2013  . Gastroesophageal reflux disease without esophagitis 09/18/2013  . Tobacco abuse 09/18/2013    Jones Bales, PT, DPT 11/10/2019, 1:18 PM  [facility] [address] [place], [name], [address] Phone: [phone]   Fax:  [phone]  Name: Russell Russell MRN: [id number] Date of Birth: 12-23-1950

## 2019-11-10 NOTE — Therapy (Signed)
[facility] [address] [place], [name], [address] Phone: [phone]   Fax:  [phone]  Occupational Therapy Treatment  Patient Details  Name: Russell Russell MRN: [id number] Date of Birth: 1950/11/24 Referring Provider (OT): [name]   Encounter Date: 11/10/2019   OT End of Session - 11/10/19 1608    Visit Number 16    Number of Visits 25    Date for OT Re-Evaluation 12/04/19    Authorization Type BCBS? "Follow Medicare Guidelines"    Authorization - Number of Visits 6    Progress Note Due on Visit 14    OT Start Time 1315    OT Stop Time 1400    OT Time Calculation (min) 45 min    Activity Tolerance Patient tolerated treatment well    Behavior During Therapy [facility] for tasks assessed/performed           Past Medical History:  Diagnosis Date  . Alcohol abuse    drinks 1 gallon of Gin every weekend, nothing during the week x >15 yrs  . Elevated transaminase level    +elev bili: abd u/s 06/2014 showed stable small hepatic hemangiomas, o/w normal.  . Essential hypertension   . GERD (gastroesophageal reflux disease)   . Hyperlipidemia 03/2014   Atorv started-chol improved  . Impaired fasting glucose 03/2014  . Nephrolithiasis   . PUD (peptic ulcer disease)   . Stroke ([place])   . Tobacco dependence    chantix: "psych effects"    Past Surgical History:  Procedure Laterality Date  . COLONOSCOPY  2005   Recall 10 yrs ([place])  . LOOP RECORDER INSERTION N/A 07/03/2019   Procedure: LOOP RECORDER INSERTION;  Surgeon: Thompson Grayer, MD;  Location: [facility];  Service: Cardiovascular;  Laterality: N/A;  . removal of kidney stones  2006   cystoscopic--removed from ureter.  No prob since.    There were no vitals filed for this visit.   Subjective Assessment - 11/10/19 1603    Patient is accompanied by: Family member    Pertinent History HTN, ETOH use, smoker, loop recorder    Currently in Pain?  No/denies    Pain Score 0-No pain                        OT Treatments/Exercises (OP) - 11/10/19 1400      Neurological Re-education Exercises   Other Exercises 1 Neuromuscular reeducation to address functional reach in sitting then standing.  Patient had some difficulty achieving full hip and knee and thoracic extension in standing to help with overhead reach.  Patient also needed facilitation for final elbow extension.  Worked in Bilateral UE patterns holding light weight ball.                    OT Education - 11/10/19 1608    Education Details aligning hips and shoulders over feet in standing    Person(s) Educated Patient;Spouse    Methods Explanation;Demonstration;Tactile cues;Verbal cues    Comprehension Need further instruction            OT Short Term Goals - 11/05/19 1316      OT SHORT TERM GOAL #1   Title Patient will compelte a home exercise program designed to improve functional use of LUE with mod cueing and set up due 09/05/19    Time 4    Period Weeks    Status Achieved    Target Date  09/05/19      OT SHORT TERM GOAL #2   Title Patient will don a front opening shirt with fasteners (buttons/zipper) with mod assist    Time 4    Period Weeks    Status Achieved      OT SHORT TERM GOAL #3   Title Patient will demonstrate ability to cut food on plate with min assist    Time 4    Period Weeks    Status Achieved      OT SHORT TERM GOAL #4   Title Patient will demonstrate low reach, grasp, release of lightweight small object (less than 1 lb) with min assist    Time 4    Period Weeks    Status Achieved      OT SHORT TERM GOAL #5   Title Patient will ambulate into bathroom and cmplete toileting with supervision and verbal cueing assistance due 11/04/19    Time 4    Period Weeks    Status Achieved      OT SHORT TERM GOAL #6   Title Patient will transport necessary items for bathing and dressing into bathroom to encourage greater  independence with showering.    Status On-going             OT Long Term Goals - 10/22/19 1555      OT LONG TERM GOAL #1   Title Patient will complete an updated HEP to address LUE functioning due 10/05/19    Status On-going      OT LONG TERM GOAL #2   Title Patient will walk with assistive device into bathroom to use toilet with min assist    Status Achieved      OT LONG TERM GOAL #3   Title Patient will transfer onto shower seat with min assist    Status Achieved      OT LONG TERM GOAL #4   Title Patient will obtain and release a 2-3 lb object at chest height shelf using LUE and min assist    Status Achieved      OT LONG TERM GOAL #5   Title Patient will toilet himself with modified independence due 12/04/19    Time 8    Period Weeks    Status New      OT LONG TERM GOAL #6   Title Patient will shower self with supervision/set up assistance    Time 8    Period Weeks    Status New      OT LONG TERM GOAL #7   Title Patient will grocery shop with wife, and will locate and retrieve at least 5 items from list with only minimal assistance    Time 8    Period Weeks    Status New                 Plan - 11/10/19 1609    Clinical Impression Statement Patient continues to show steady improvement with functional mobility, balance, safety, and active controlled movement in LUE.    OT Occupational Profile and History Detailed Assessment- Review of Records and additional review of physical, cognitive, psychosocial history related to current functional performance    Occupational performance deficits (Please refer to evaluation for details): ADL's;IADL's;Rest and Sleep;Leisure    Body Structure / Function / Physical Skills ADL;Coordination;Endurance;GMC;UE functional use;Sensation;Decreased knowledge of precautions;Balance;Body mechanics;Decreased knowledge of use of DME;Flexibility;IADL;Pain;Vision;Cardiopulmonary status limiting  activity;Dexterity;FMC;Proprioception;Strength;Tone;ROM    Cognitive Skills Attention;Orientation;Thought;Perception;Problem Solve;Safety Awareness;Sequencing;Memory;Learn    Psychosocial Skills Habits;Routines and Behaviors  Rehab Potential Good    Clinical Decision Making Several treatment options, min-mod task modification necessary    Comorbidities Affecting Occupational Performance: May have comorbidities impacting occupational performance    Modification or Assistance to Complete Evaluation  Min-Moderate modification of tasks or assist with assess necessary to complete eval    OT Frequency 2x / week    OT Duration 8 weeks    OT Treatment/Interventions Self-care/ADL training;Electrical Stimulation;Therapeutic exercise;Visual/perceptual remediation/compensation;Patient/family education;Splinting;Neuromuscular education;Moist Heat;Aquatic Therapy;Balance training;Therapeutic activities;Functional Mobility Training;Fluidtherapy;DME and/or AE instruction;Manual Therapy;Cognitive remediation/compensation    Plan continue to address functional mobility and mid to high range functional reach    OT Home Exercise Plan coordination HEP    Consulted and Agree with Plan of Care Patient;Family member/caregiver    Family Member Consulted Wife, [name]           Patient will benefit from skilled therapeutic intervention in order to improve the following deficits and impairments:   Body Structure / Function / Physical Skills: ADL, Coordination, Endurance, GMC, UE functional use, Sensation, Decreased knowledge of precautions, Balance, Body mechanics, Decreased knowledge of use of DME, Flexibility, IADL, Pain, Vision, Cardiopulmonary status limiting activity, Dexterity, FMC, Proprioception, Strength, Tone, ROM Cognitive Skills: Attention, Orientation, Thought, Perception, Problem Solve, Safety Awareness, Sequencing, Memory, Learn Psychosocial Skills: Habits, Routines and Behaviors   Visit  Diagnosis: Hemiplegia and hemiparesis following cerebral infarction affecting left dominant side (HCC)  Unsteadiness on feet  Muscle weakness (generalized)  Other lack of coordination  Other disturbances of skin sensation  Apraxia  Attention and concentration deficit    Problem List Patient Active Problem List   Diagnosis Date Noted  . Abnormality of gait 09/24/2019  . AKI (acute kidney injury) ([place])   . Fever   . Sepsis without acute organ dysfunction ([place])   . Elevated BUN   . Blood pressure increase diastolic   . Labile blood pressure   . Leukopenia   . Benign essential HTN   . Hypoalbuminemia   . Cerebrovascular accident (CVA) of right basal ganglia ([place]) 07/07/2019  . Acute ischemic stroke ([place])   . Dyslipidemia   . Dysphagia, post-stroke   . Polycythemia   . Stroke ([place]) 06/30/2019  . Prediabetes 12/23/2016  . Elevated LFTs 12/23/2016  . Pure hypercholesterolemia 12/21/2016  . Alcoholism ([place]) 12/21/2016  . Eczema 09/18/2013  . Essential hypertension 09/18/2013  . Gastroesophageal reflux disease without esophagitis 09/18/2013  . Tobacco abuse 09/18/2013    Mariah Milling, OTR/L 11/10/2019, 4:11 PM  [facility] [address] [place], [name], [address] Phone: [phone]   Fax:  [phone]  Name: Russell Russell MRN: [id number] Date of Birth: 09-Mar-1951

## 2019-11-12 ENCOUNTER — Ambulatory Visit (INDEPENDENT_AMBULATORY_CARE_PROVIDER_SITE_OTHER): Payer: BC Managed Care – PPO | Admitting: *Deleted

## 2019-11-12 ENCOUNTER — Other Ambulatory Visit: Payer: Self-pay

## 2019-11-12 ENCOUNTER — Encounter: Payer: BC Managed Care – PPO | Admitting: Speech Pathology

## 2019-11-12 ENCOUNTER — Encounter: Payer: Self-pay | Admitting: Occupational Therapy

## 2019-11-12 ENCOUNTER — Ambulatory Visit: Payer: Medicare Other | Attending: Physician Assistant

## 2019-11-12 ENCOUNTER — Ambulatory Visit: Payer: Medicare Other | Admitting: Occupational Therapy

## 2019-11-12 DIAGNOSIS — R41840 Attention and concentration deficit: Secondary | ICD-10-CM

## 2019-11-12 DIAGNOSIS — M6281 Muscle weakness (generalized): Secondary | ICD-10-CM

## 2019-11-12 DIAGNOSIS — I6340 Cerebral infarction due to embolism of unspecified cerebral artery: Secondary | ICD-10-CM

## 2019-11-12 DIAGNOSIS — I69352 Hemiplegia and hemiparesis following cerebral infarction affecting left dominant side: Secondary | ICD-10-CM

## 2019-11-12 DIAGNOSIS — R2681 Unsteadiness on feet: Secondary | ICD-10-CM

## 2019-11-12 DIAGNOSIS — R209 Unspecified disturbances of skin sensation: Secondary | ICD-10-CM | POA: Diagnosis present

## 2019-11-12 DIAGNOSIS — R278 Other lack of coordination: Secondary | ICD-10-CM | POA: Insufficient documentation

## 2019-11-12 DIAGNOSIS — R482 Apraxia: Secondary | ICD-10-CM | POA: Insufficient documentation

## 2019-11-12 DIAGNOSIS — R2689 Other abnormalities of gait and mobility: Secondary | ICD-10-CM | POA: Diagnosis present

## 2019-11-12 DIAGNOSIS — R208 Other disturbances of skin sensation: Secondary | ICD-10-CM

## 2019-11-12 LAB — CUP PACEART REMOTE DEVICE CHECK
Date Time Interrogation Session: 20210901230520
Implantable Pulse Generator Implant Date: 20210423

## 2019-11-12 NOTE — Therapy (Signed)
[facility] [address] [place], [name], [address] Phone: [phone]   Fax:  [phone]  Physical Therapy Treatment  Patient Details  Name: Russell Russell MRN: [id number] Date of Birth: Jul 13, 1950 Referring Provider (PT): Lauraine Rinne, PA-C   Encounter Date: 11/12/2019   PT End of Session - 11/12/19 1443    Visit Number 26    Number of Visits 31    Date for PT Re-Evaluation 12/30/19   new POC for 8 weeks, Cert for 90 days   Authorization Type BCBS (follow medicare guidelines, 10th visit PN)    Progress Note Due on Visit 20    PT Start Time 1405    PT Stop Time 1445    PT Time Calculation (min) 40 min    Equipment Utilized During Treatment Gait belt    Activity Tolerance Patient tolerated treatment well    Behavior During Therapy [facility] for tasks assessed/performed           Past Medical History:  Diagnosis Date  . Alcohol abuse    drinks 1 gallon of Gin every weekend, nothing during the week x >15 yrs  . Elevated transaminase level    +elev bili: abd u/s 06/2014 showed stable small hepatic hemangiomas, o/w normal.  . Essential hypertension   . GERD (gastroesophageal reflux disease)   . Hyperlipidemia 03/2014   Atorv started-chol improved  . Impaired fasting glucose 03/2014  . Nephrolithiasis   . PUD (peptic ulcer disease)   . Stroke ([place])   . Tobacco dependence    chantix: "psych effects"    Past Surgical History:  Procedure Laterality Date  . COLONOSCOPY  2005   Recall 10 yrs ([place])  . LOOP RECORDER INSERTION N/A 07/03/2019   Procedure: LOOP RECORDER INSERTION;  Surgeon: Thompson Grayer, MD;  Location: [facility];  Service: Cardiovascular;  Laterality: N/A;  . removal of kidney stones  2006   cystoscopic--removed from ureter.  No prob since.    There were no vitals filed for this visit.   Subjective Assessment - 11/12/19 1429    Subjective Nothing new to report, no falls    Patient  is accompained by: Family member    Pertinent History PMH: HTN, alcohol abuse (ongoing use), HLD, GERD, PUD, tobacco use    Limitations Standing;Walking;House hold activities    Patient Stated Goals return to prior level of function, walk, "wheelchair is not apart of me" (wants to get out of the w/c)              Neuro Re-ed L sidelying: anterior elevation and posterio depression of pelvis: isometric, Passive, AA and Active motions. Manually resisted with slow reversals. Also performed isometric and manually resisted hip flexion with eccentric hip extension while patient holds anterior elevation of L pelvis isometrically: 3 x 5 rounds  Gait training:  1 x 117'- improved step length and L hip flexion during swing phase noted 1 x 117' - with foot up brace, pt demonstrated increased faigute during the second session With gait training cues required for scanning environment ahead and not looking down until he gets to curbs or obstacles ahead.                         PT Short Term Goals - 10/27/19 1116      PT SHORT TERM GOAL #1   Title Patient will demonstrate ability to ambulate >400 ft on indoor level surfaces with LRAD  and CGA to demonstrate improved mobility    Baseline 422 ft, Min Guard    Time 4    Period Weeks    Status Achieved    Target Date 10/29/19      PT SHORT TERM GOAL #2   Title Patient will demonstrate ability to ascend/descend 1 curb with [employer] and RW to demo improved entrance/exit of home    Baseline Patient able to ascend/descend single curb with CGA/Min Guard and RW.    Time 4    Period Weeks    Status Achieved    Target Date 10/29/19             PT Long Term Goals - 10/01/19 1237      PT LONG TERM GOAL #1   Title Patient will be independent with Final HEP, with wifes assistance (ALL LTG's Due: 11/26/2019)    Baseline Continue to progress HEP at this time    Time 8    Period Weeks    Status On-going    Target Date 11/26/19        PT LONG TERM GOAL #2   Title Patient will demonstrate ability to complete all transfers, Mod I, LRAD to demonstrate improved independence with functional mobility    Baseline CGA on 7/22    Time 8    Period Weeks    Status New    Target Date 11/26/19      PT LONG TERM GOAL #3   Title Patient will demo ability to ambulate >200 ft on outdoor/unlevel surfaces to demo improved community mobility    Baseline not yet assessed    Time 8    Period Weeks    Status New    Target Date 11/26/19      PT LONG TERM GOAL #4   Title Patient will demonstrate ability to complete sit <> stand, Mod I, w/ LRAD to demonstrate improved functional mobility    Baseline Supv Level 7/22    Time 8    Period Weeks    Status Revised    Target Date 11/26/19      PT LONG TERM GOAL #5   Title Patient will demo ability to ascend/descend curb with LRAD and Supervision to demonstrate improved ability to enter/exit home    Baseline Min A    Time 8    Period Weeks    Status New    Target Date 11/26/19                 Plan - 11/12/19 1430    Clinical Impression Statement today's skilled session was focused on improving neuromuscular control to improve pelvis elevation, hip flexion and knee fleixon during swing phase on L LE. Patient demonstrated improved ability to pick up L LE and improved step length with gait after neuro re-ed.    Personal Factors and Comorbidities Comorbidity 3+    Comorbidities HTN, alcohol abuse (ongoing use), HLD, GERD, PUD, tobacco use    Examination-Activity Limitations Bed Mobility;Dressing;Locomotion Level;Stairs;Stand;Toileting;Transfers;Bathing    Examination-Participation Restrictions Driving;Interpersonal Relationship;Yard Work    [employer] Evolving/Moderate complexity    Rehab Potential Good    PT Frequency 2x / week    PT Duration 8 weeks    PT Treatment/Interventions ADLs/Self Care Home Management;Electrical Stimulation;DME  Instruction;Moist Heat;Cryotherapy;Gait training;Stair training;Functional mobility training;Therapeutic activities;Therapeutic exercise;Balance training;Neuromuscular re-education;Patient/family education;Orthotic Fit/Training;Wheelchair mobility training;Passive range of motion    PT Next Visit Plan Try foot up brace again next session    Consulted and Agree  with Plan of Care Patient;Family member/caregiver    Family Member Consulted Wife           Patient will benefit from skilled therapeutic intervention in order to improve the following deficits and impairments:  Abnormal gait, Decreased coordination, Difficulty walking, Impaired tone, Decreased safety awareness, Decreased endurance, Decreased activity tolerance, Pain, Decreased balance, Decreased knowledge of use of DME, Postural dysfunction, Impaired sensation, Decreased strength, Decreased mobility  Visit Diagnosis: Hemiplegia and hemiparesis following cerebral infarction affecting left dominant side (HCC)  Unsteadiness on feet  Muscle weakness (generalized)  Other lack of coordination  Other abnormalities of gait and mobility     Problem List Patient Active Problem List   Diagnosis Date Noted  . Abnormality of gait 09/24/2019  . AKI (acute kidney injury) ([place])   . Fever   . Sepsis without acute organ dysfunction ([place])   . Elevated BUN   . Blood pressure increase diastolic   . Labile blood pressure   . Leukopenia   . Benign essential HTN   . Hypoalbuminemia   . Cerebrovascular accident (CVA) of right basal ganglia ([place]) 07/07/2019  . Acute ischemic stroke ([place])   . Dyslipidemia   . Dysphagia, post-stroke   . Polycythemia   . Stroke ([place]) 06/30/2019  . Prediabetes 12/23/2016  . Elevated LFTs 12/23/2016  . Pure hypercholesterolemia 12/21/2016  . Alcoholism ([place]) 12/21/2016  . Eczema 09/18/2013  . Essential hypertension 09/18/2013  . Gastroesophageal reflux disease without esophagitis 09/18/2013  . Tobacco  abuse 09/18/2013    Kerrie Pleasure, PT 11/12/2019, 2:48 PM  [facility] [address] [place], [name], [address] Phone: [phone]   Fax:  [phone]  Name: Pawel Soules MRN: [id number] Date of Birth: [birth year]

## 2019-11-12 NOTE — Therapy (Signed)
[facility] [address] [place], [name], [address] Phone: [phone]   Fax:  [phone]  Occupational Therapy Treatment  Patient Details  Name: Russell Russell MRN: [id number] Date of Birth: 1950/05/19 Referring Provider (OT): [name]   Encounter Date: 11/12/2019   OT End of Session - 11/12/19 1538    Visit Number 17    Number of Visits 25    Date for OT Re-Evaluation 12/04/19    Authorization Type BCBS? "Follow Medicare Guidelines"    Authorization - Number of Visits 17    Progress Note Due on Visit 70    OT Start Time 1446    OT Stop Time 1528    OT Time Calculation (min) 42 min    Activity Tolerance Patient tolerated treatment well    Behavior During Therapy [facility] for tasks assessed/performed           Past Medical History:  Diagnosis Date  . Alcohol abuse    drinks 1 gallon of Gin every weekend, nothing during the week x >15 yrs  . Elevated transaminase level    +elev bili: abd u/s 06/2014 showed stable small hepatic hemangiomas, o/w normal.  . Essential hypertension   . GERD (gastroesophageal reflux disease)   . Hyperlipidemia 03/2014   Atorv started-chol improved  . Impaired fasting glucose 03/2014  . Nephrolithiasis   . PUD (peptic ulcer disease)   . Stroke ([place])   . Tobacco dependence    chantix: "psych effects"    Past Surgical History:  Procedure Laterality Date  . COLONOSCOPY  2005   Recall 10 yrs ([place])  . LOOP RECORDER INSERTION N/A 07/03/2019   Procedure: LOOP RECORDER INSERTION;  Surgeon: Thompson Grayer, MD;  Location: [facility];  Service: Cardiovascular;  Laterality: N/A;  . removal of kidney stones  2006   cystoscopic--removed from ureter.  No prob since.    There were no vitals filed for this visit.   Subjective Assessment - 11/12/19 1449    Subjective  Nothing new, no complaints    Patient is accompanied by: Family member    Pertinent History HTN, ETOH use,  smoker, loop recorder    Currently in Pain? No/denies    Pain Score 0-No pain                        OT Treatments/Exercises (OP) - 11/12/19 0001      Neurological Re-education Exercises   Other Exercises 1 Neuromuscular reeducation to address standing alignment and balance, and overhead reach with ball on wall.  Patient requires cueing for weight shift onto left leg to extend reach overhead.  Patient did better with repetition.  Worked on timing and coordiination for catching and throwing.  Working on dynamic balance with reduced reliance on UE for support.        Other Exercises 2 Sit to stand without UE support - focus on alignment in midline with activation of BLE for transition.                      OT Short Term Goals - 11/12/19 1540      OT SHORT TERM GOAL #1   Title Patient will compelte a home exercise program designed to improve functional use of LUE with mod cueing and set up due 09/05/19    Time 4    Period Weeks    Status Achieved    Target Date 09/05/19  OT SHORT TERM GOAL #2   Title Patient will don a front opening shirt with fasteners (buttons/zipper) with mod assist    Time 4    Period Weeks    Status Achieved      OT SHORT TERM GOAL #3   Title Patient will demonstrate ability to cut food on plate with min assist    Time 4    Period Weeks    Status Achieved      OT SHORT TERM GOAL #4   Title Patient will demonstrate low reach, grasp, release of lightweight small object (less than 1 lb) with min assist    Time 4    Period Weeks    Status Achieved      OT SHORT TERM GOAL #5   Title Patient will ambulate into bathroom and cmplete toileting with supervision and verbal cueing assistance due 11/04/19    Time 4    Period Weeks    Status Achieved      OT SHORT TERM GOAL #6   Title Patient will transport necessary items for bathing and dressing into bathroom to encourage greater independence with showering.    Status On-going              OT Long Term Goals - 11/12/19 1540      OT LONG TERM GOAL #1   Title Patient will complete an updated HEP to address LUE functioning due 10/05/19    Status On-going      OT LONG TERM GOAL #2   Title Patient will walk with assistive device into bathroom to use toilet with min assist    Status Achieved      OT LONG TERM GOAL #3   Title Patient will transfer onto shower seat with min assist    Status Achieved      OT LONG TERM GOAL #4   Title Patient will obtain and release a 2-3 lb object at chest height shelf using LUE and min assist    Status Achieved      OT LONG TERM GOAL #5   Title Patient will toilet himself with modified independence due 12/04/19    Time 8    Period Weeks    Status On-going      OT LONG TERM GOAL #6   Title Patient will shower self with supervision/set up assistance    Time 8    Period Weeks    Status On-going      OT LONG TERM GOAL #7   Title Patient will grocery shop with wife, and will locate and retrieve at least 5 items from list with only minimal assistance    Time 8    Period Weeks    Status On-going                 Plan - 11/12/19 1539    Clinical Impression Statement Patient continues to show steady improvement with functional mobility, balance, safety, and active controlled movement in LUE.    OT Occupational Profile and History Detailed Assessment- Review of Records and additional review of physical, cognitive, psychosocial history related to current functional performance    Occupational performance deficits (Please refer to evaluation for details): ADL's;IADL's;Rest and Sleep;Leisure    Body Structure / Function / Physical Skills ADL;Coordination;Endurance;GMC;UE functional use;Sensation;Decreased knowledge of precautions;Balance;Body mechanics;Decreased knowledge of use of DME;Flexibility;IADL;Pain;Vision;Cardiopulmonary status limiting activity;Dexterity;FMC;Proprioception;Strength;Tone;ROM    Cognitive Skills  Attention;Orientation;Thought;Perception;Problem Solve;Safety Awareness;Sequencing;Memory;Learn    Psychosocial Skills Habits;Routines and Behaviors    Rehab Potential Good  Clinical Decision Making Several treatment options, min-mod task modification necessary    Comorbidities Affecting Occupational Performance: May have comorbidities impacting occupational performance    Modification or Assistance to Complete Evaluation  Min-Moderate modification of tasks or assist with assess necessary to complete eval    OT Frequency 2x / week    OT Duration 8 weeks    OT Treatment/Interventions Self-care/ADL training;Electrical Stimulation;Therapeutic exercise;Visual/perceptual remediation/compensation;Patient/family education;Splinting;Neuromuscular education;Moist Heat;Aquatic Therapy;Balance training;Therapeutic activities;Functional Mobility Training;Fluidtherapy;DME and/or AE instruction;Manual Therapy;Cognitive remediation/compensation    Plan continue to address functional mobility and mid to high range functional reach    OT Home Exercise Plan coordination HEP    Consulted and Agree with Plan of Care Patient;Family member/caregiver    Family Member Consulted Wife, [name]           Patient will benefit from skilled therapeutic intervention in order to improve the following deficits and impairments:   Body Structure / Function / Physical Skills: ADL, Coordination, Endurance, GMC, UE functional use, Sensation, Decreased knowledge of precautions, Balance, Body mechanics, Decreased knowledge of use of DME, Flexibility, IADL, Pain, Vision, Cardiopulmonary status limiting activity, Dexterity, FMC, Proprioception, Strength, Tone, ROM Cognitive Skills: Attention, Orientation, Thought, Perception, Problem Solve, Safety Awareness, Sequencing, Memory, Learn Psychosocial Skills: Habits, Routines and Behaviors   Visit Diagnosis: Hemiplegia and hemiparesis following cerebral infarction affecting left dominant  side (HCC)  Unsteadiness on feet  Muscle weakness (generalized)  Other lack of coordination  Other disturbances of skin sensation  Apraxia  Attention and concentration deficit    Problem List Patient Active Problem List   Diagnosis Date Noted  . Abnormality of gait 09/24/2019  . AKI (acute kidney injury) ([place])   . Fever   . Sepsis without acute organ dysfunction ([place])   . Elevated BUN   . Blood pressure increase diastolic   . Labile blood pressure   . Leukopenia   . Benign essential HTN   . Hypoalbuminemia   . Cerebrovascular accident (CVA) of right basal ganglia ([place]) 07/07/2019  . Acute ischemic stroke ([place])   . Dyslipidemia   . Dysphagia, post-stroke   . Polycythemia   . Stroke ([place]) 06/30/2019  . Prediabetes 12/23/2016  . Elevated LFTs 12/23/2016  . Pure hypercholesterolemia 12/21/2016  . Alcoholism ([place]) 12/21/2016  . Eczema 09/18/2013  . Essential hypertension 09/18/2013  . Gastroesophageal reflux disease without esophagitis 09/18/2013  . Tobacco abuse 09/18/2013    Mariah Milling, OTR/L 11/12/2019, 3:41 PM  [facility] [address] [place], [name], [address] Phone: [phone]   Fax:  [phone]  Name: Russell Russell MRN: [id number] Date of Birth: 1950/11/12

## 2019-11-13 NOTE — Progress Notes (Signed)
[facility]link Summary Report / Loop Recorder 

## 2019-11-20 ENCOUNTER — Ambulatory Visit: Payer: Medicare Other

## 2019-11-20 ENCOUNTER — Other Ambulatory Visit: Payer: Self-pay

## 2019-11-20 DIAGNOSIS — M6281 Muscle weakness (generalized): Secondary | ICD-10-CM

## 2019-11-20 DIAGNOSIS — R2681 Unsteadiness on feet: Secondary | ICD-10-CM

## 2019-11-20 DIAGNOSIS — R278 Other lack of coordination: Secondary | ICD-10-CM

## 2019-11-20 DIAGNOSIS — I69352 Hemiplegia and hemiparesis following cerebral infarction affecting left dominant side: Secondary | ICD-10-CM | POA: Diagnosis not present

## 2019-11-20 NOTE — Therapy (Signed)
[facility] [address] [place], [name], [address] Phone: [phone]   Fax:  [phone]  Physical Therapy Treatment  Patient Details  Name: Russell Russell MRN: [id number] Date of Birth: 08/03/1950 Referring Provider (PT): Lauraine Rinne, PA-C   Encounter Date: 11/20/2019   PT End of Session - 11/20/19 0806    Visit Number 27    Number of Visits 31    Date for PT Re-Evaluation 12/30/19   new POC for 8 weeks, Cert for 90 days   Authorization Type BCBS (follow medicare guidelines, 10th visit PN)    Progress Note Due on Visit 20    PT Start Time 0801    PT Stop Time 0844    PT Time Calculation (min) 43 min    Equipment Utilized During Treatment Gait belt    Activity Tolerance Patient tolerated treatment well    Behavior During Therapy [facility] for tasks assessed/performed           Past Medical History:  Diagnosis Date  . Alcohol abuse    drinks 1 gallon of Gin every weekend, nothing during the week x >15 yrs  . Elevated transaminase level    +elev bili: abd u/s 06/2014 showed stable small hepatic hemangiomas, o/w normal.  . Essential hypertension   . GERD (gastroesophageal reflux disease)   . Hyperlipidemia 03/2014   Atorv started-chol improved  . Impaired fasting glucose 03/2014  . Nephrolithiasis   . PUD (peptic ulcer disease)   . Stroke ([place])   . Tobacco dependence    chantix: "psych effects"    Past Surgical History:  Procedure Laterality Date  . COLONOSCOPY  2005   Recall 10 yrs ([place])  . LOOP RECORDER INSERTION N/A 07/03/2019   Procedure: LOOP RECORDER INSERTION;  Surgeon: Thompson Grayer, MD;  Location: [facility];  Service: Cardiovascular;  Laterality: N/A;  . removal of kidney stones  2006   cystoscopic--removed from ureter.  No prob since.    There were no vitals filed for this visit.   Subjective Assessment - 11/20/19 0805    Subjective Patient reports no new changes/complaints.  No falls. Patient did not walk into clinic today, used RW.    Patient is accompained by: Family member    Pertinent History PMH: HTN, alcohol abuse (ongoing use), HLD, GERD, PUD, tobacco use    Limitations Standing;Walking;House hold activities    Patient Stated Goals return to prior level of function, walk, "wheelchair is not apart of me" (wants to get out of the w/c)    Currently in Pain? No/denies                             [facility] Adult PT Treatment/Exercise - 11/20/19 0001      Transfers   Transfers Sit to Stand;Stand to Sit    Sit to Stand 5: Supervision;With upper extremity assist;From chair/3-in-1    Sit to Stand Details Verbal cues for sequencing;Verbal cues for precautions/safety;Verbal cues for safe use of DME/AE    Stand to Sit 5: Supervision;With upper extremity assist;To bed;To chair/3-in-1    Stand to Sit Details (indicate cue type and reason) Verbal cues for sequencing;Verbal cues for technique;Verbal cues for safe use of DME/AE    Stand Pivot Transfers 5: Supervision    Comments completed x 5 reps throughout session with activities      Ambulation/Gait   Ambulation/Gait Yes    Ambulation/Gait Assistance 4: Min  guard;5: Supervision    Ambulation/Gait Assistance Details PT donned left foot up brace at start of session. Completed ambulation x 350 ft with RW around therapy gym. patient demo improved heel toe pattern with foot up brace donned when patient concentrating on improved hip/knee flexion and bringing LLE through with gait. Foot up brace was not as beneficial with ambulation when distracted or difficulty concentrating. In // bars completed gait training without UE support, 7 x 10', PT providing manual faciliation at pelvis for improved step length and weight shift. Cued for improved step length, patient often demo hesistancy to not use UE support from // bars requiring cueing from PT. Progressed to completing gait training with small base quad cane in //  bars. PT educating on proper sequencing of AD. Completed ambulation with device 6 x 10'. Improved sequencing noticed with increased practice, PT still providing verbal cues for improved step length. Patient still demo high reliance on vision often looking down with ambulation despite PT verbal cues for using mirror as visual feedback and looking up into environment.     Ambulation Distance (Feet) 350 Feet    Assistive device Rolling walker    Gait Pattern Step-through pattern;Step-to pattern;Decreased stride length;Decreased stance time - right;Decreased step length - left    Ambulation Surface Level;Indoor    Gait Comments Intermittent rest breaks required due to BLE fatigue with ambulation.                     PT Short Term Goals - 10/27/19 1116      PT SHORT TERM GOAL #1   Title Patient will demonstrate ability to ambulate >400 ft on indoor level surfaces with LRAD and CGA to demonstrate improved mobility    Baseline 422 ft, Min Guard    Time 4    Period Weeks    Status Achieved    Target Date 10/29/19      PT SHORT TERM GOAL #2   Title Patient will demonstrate ability to ascend/descend 1 curb with [employer] and RW to demo improved entrance/exit of home    Baseline Patient able to ascend/descend single curb with CGA/Min Guard and RW.    Time 4    Period Weeks    Status Achieved    Target Date 10/29/19             PT Long Term Goals - 10/01/19 1237      PT LONG TERM GOAL #1   Title Patient will be independent with Final HEP, with wifes assistance (ALL LTG's Due: 11/26/2019)    Baseline Continue to progress HEP at this time    Time 8    Period Weeks    Status On-going    Target Date 11/26/19      PT LONG TERM GOAL #2   Title Patient will demonstrate ability to complete all transfers, Mod I, LRAD to demonstrate improved independence with functional mobility    Baseline CGA on 7/22    Time 8    Period Weeks    Status New    Target Date 11/26/19      PT LONG  TERM GOAL #3   Title Patient will demo ability to ambulate >200 ft on outdoor/unlevel surfaces to demo improved community mobility    Baseline not yet assessed    Time 8    Period Weeks    Status New    Target Date 11/26/19      PT LONG TERM GOAL #4  Title Patient will demonstrate ability to complete sit <> stand, Mod I, w/ LRAD to demonstrate improved functional mobility    Baseline Supv Level 7/22    Time 8    Period Weeks    Status Revised    Target Date 11/26/19      PT LONG TERM GOAL #5   Title Patient will demo ability to ascend/descend curb with LRAD and Supervision to demonstrate improved ability to enter/exit home    Baseline Min A    Time 8    Period Weeks    Status New    Target Date 11/26/19                 Plan - 11/20/19 1121    Clinical Impression Statement Completed extensive gait training today during session all with foot up brace applied. Improvements noted with heel toe pattern with brace when patient is concentrating, without concentrating patient still demo difficulty due to decreased hip/knee flexion with swing phase. Completed gait training in // bars with small base quad cane and without UE support. PT provide continued cues for step length throughout session.    Personal Factors and Comorbidities Comorbidity 3+    Comorbidities HTN, alcohol abuse (ongoing use), HLD, GERD, PUD, tobacco use    Examination-Activity Limitations Bed Mobility;Dressing;Locomotion Level;Stairs;Stand;Toileting;Transfers;Bathing    Examination-Participation Restrictions Driving;Interpersonal Relationship;Yard Work    [employer] Evolving/Moderate complexity    Rehab Potential Good    PT Frequency 2x / week    PT Duration 8 weeks    PT Treatment/Interventions ADLs/Self Care Home Management;Electrical Stimulation;DME Instruction;Moist Heat;Cryotherapy;Gait training;Stair training;Functional mobility training;Therapeutic activities;Therapeutic  exercise;Balance training;Neuromuscular re-education;Patient/family education;Orthotic Fit/Training;Wheelchair mobility training;Passive range of motion    PT Next Visit Plan Continued gait training. Attempt small quad cane again. Balance activities.    Consulted and Agree with Plan of Care Patient;Family member/caregiver    Family Member Consulted Wife           Patient will benefit from skilled therapeutic intervention in order to improve the following deficits and impairments:  Abnormal gait, Decreased coordination, Difficulty walking, Impaired tone, Decreased safety awareness, Decreased endurance, Decreased activity tolerance, Pain, Decreased balance, Decreased knowledge of use of DME, Postural dysfunction, Impaired sensation, Decreased strength, Decreased mobility  Visit Diagnosis: Hemiplegia and hemiparesis following cerebral infarction affecting left dominant side (HCC)  Unsteadiness on feet  Muscle weakness (generalized)  Other lack of coordination     Problem List Patient Active Problem List   Diagnosis Date Noted  . Abnormality of gait 09/24/2019  . AKI (acute kidney injury) ([place])   . Fever   . Sepsis without acute organ dysfunction ([place])   . Elevated BUN   . Blood pressure increase diastolic   . Labile blood pressure   . Leukopenia   . Benign essential HTN   . Hypoalbuminemia   . Cerebrovascular accident (CVA) of right basal ganglia ([place]) 07/07/2019  . Acute ischemic stroke ([place])   . Dyslipidemia   . Dysphagia, post-stroke   . Polycythemia   . Stroke ([place]) 06/30/2019  . Prediabetes 12/23/2016  . Elevated LFTs 12/23/2016  . Pure hypercholesterolemia 12/21/2016  . Alcoholism ([place]) 12/21/2016  . Eczema 09/18/2013  . Essential hypertension 09/18/2013  . Gastroesophageal reflux disease without esophagitis 09/18/2013  . Tobacco abuse 09/18/2013    Jones Bales, PT, DPT 11/20/2019, 11:29 AM  [facility] [address] [place], [name], [address] Phone: [phone]   Fax:  [phone]  Name:  Jaquane Boughner MRN: [id number] Date of Birth: 1951/02/12

## 2019-11-24 ENCOUNTER — Other Ambulatory Visit: Payer: Self-pay

## 2019-11-24 ENCOUNTER — Ambulatory Visit: Payer: Medicare Other

## 2019-11-24 DIAGNOSIS — R2681 Unsteadiness on feet: Secondary | ICD-10-CM

## 2019-11-24 DIAGNOSIS — M6281 Muscle weakness (generalized): Secondary | ICD-10-CM

## 2019-11-24 DIAGNOSIS — R278 Other lack of coordination: Secondary | ICD-10-CM

## 2019-11-24 DIAGNOSIS — I69352 Hemiplegia and hemiparesis following cerebral infarction affecting left dominant side: Secondary | ICD-10-CM | POA: Diagnosis not present

## 2019-11-24 DIAGNOSIS — R2689 Other abnormalities of gait and mobility: Secondary | ICD-10-CM

## 2019-11-24 NOTE — Therapy (Signed)
[name] [address] [place], [name], [address] Phone: [phone]   Fax:  [phone]  Physical Therapy Treatment  Patient Details  Name: Russell Russell MRN: [id number] Date of Birth: [birth year] Referring Provider (PT): Lauraine Rinne, PA-C   Encounter Date: 11/24/2019   PT End of Session - 11/24/19 1018    Visit Number 28    Number of Visits 31    Date for PT Re-Evaluation 12/30/19   new POC for 8 weeks, Cert for 90 days   Authorization Type [employer] (follow medicare guidelines, 10th visit PN)    Progress Note Due on Visit 20    PT Start Time 1016    PT Stop Time 1059    PT Time Calculation (min) 43 min    Equipment Utilized During Treatment Gait belt    Activity Tolerance Patient tolerated treatment well    Behavior During Therapy WFL for tasks assessed/performed           Past Medical History:  Diagnosis Date   Alcohol abuse    drinks 1 gallon of Gin every weekend, nothing during the week x >15 yrs   Elevated transaminase level    +elev bili: abd u/s 06/2014 showed stable small hepatic hemangiomas, o/w normal.   Essential hypertension    GERD (gastroesophageal reflux disease)    Hyperlipidemia 03/2014   Atorv started-chol improved   Impaired fasting glucose 03/2014   Nephrolithiasis    PUD (peptic ulcer disease)    Stroke ([place])    Tobacco dependence    chantix: "psych effects"    Past Surgical History:  Procedure Laterality Date   COLONOSCOPY  2005   Recall 10 yrs ([place])   LOOP RECORDER INSERTION N/A 07/03/2019   Procedure: LOOP RECORDER INSERTION;  Surgeon: Thompson Grayer, MD;  Location: [facility];  Service: Cardiovascular;  Laterality: N/A;   removal of kidney stones  2006   cystoscopic--removed from ureter.  No prob since.    There were no vitals filed for this visit.   Subjective Assessment - 11/24/19 1018    Subjective No new changes/complaints since last  visit. No Falls.    Patient is accompained by: Family member    Pertinent History PMH: HTN, alcohol abuse (ongoing use), HLD, GERD, PUD, tobacco use    Limitations Standing;Walking;House hold activities    Patient Stated Goals return to prior level of function, walk, "wheelchair is not apart of me" (wants to get out of the w/c)    Currently in Pain? No/denies                             [facility] Adult PT Treatment/Exercise - 11/24/19 0001      Transfers   Transfers Sit to Stand;Stand to Sit    Sit to Stand 5: Supervision;With upper extremity assist;From chair/3-in-1    Sit to Stand Details Verbal cues for sequencing;Verbal cues for precautions/safety;Verbal cues for safe use of DME/AE    Stand to Sit 5: Supervision;With upper extremity assist;To bed;To chair/3-in-1    Stand to Sit Details (indicate cue type and reason) Verbal cues for sequencing;Verbal cues for technique;Verbal cues for safe use of DME/AE      Ambulation/Gait   Ambulation/Gait Yes    Ambulation/Gait Assistance 5: Supervision;4: Min guard    Ambulation/Gait Assistance Details Completed gait training with RW, with focus on looking forwad scanning environment vs. looking down at B feet. Decreased step  length with decreased visual cues. PT providing verbal cues for improved step length and manual faciliation at pelvis. Completed gait training with small base quad cane 2 x 25' with PT providing demonstrate and verbal/tactile cues for proper use of AD and for improved sequencing, decreased step length noted with completion. In // bars completed gait training 10' x 5 reps with hand held assist for working to promote improved step length and reduced UE assist. PT providing verbal cues to reduce reliance on UE and focus on improved support through LE.     Ambulation Distance (Feet) 230 Feet   x 1, 10 x 5, 25' x 2   Assistive device Rolling walker    Gait Pattern Step-through pattern;Step-to pattern;Decreased stride  length;Decreased stance time - right;Decreased step length - left    Ambulation Surface Level;Indoor    Stairs Yes    Stairs Assistance 4: Min guard    Stairs Assistance Details (indicate cue type and reason) completed ascending/descending stairs with step to pattern and B rails. PT educating on proper sequencing with completion, as patient intiially trying to ascend with LLE.     Stair Management Technique Two rails;Step to pattern;Forwards    Number of Stairs 4    Height of Stairs 6      High Level Balance   High Level Balance Activities Side stepping;Marching forwards    High Level Balance Comments In // bars: completed x 3 laps down and back with BUE support of forward marching, PT educaitng on improved hip/knee flexion as well as control with placing LLE back on ground. Completed side stepping x 3 laps, down and back with BUE support, PT providing vebral cues to keep hips aligned.       Neuro Re-ed    Neuro Re-ed Details  Completed alternating toe taps x 10 reps to 4" step with light UE support initially, progressed to hand held assist as patient dmeo hesistancy to completed without UE support often still reaching for // bars. increased difficulty noted with toe tap on RLE, as patient demo difficulty with weight shift/acceptance to LLE.       Exercises   Exercises Other Exercises    Other Exercises  Completed forward step ups to 6" step leading with LLE. PT educating on standing tall and maintain knee extension with completion, completed x 10 reps with BUE support. With tactile/verbal cues for improved knee extension and posture patient able to self correct.                     PT Short Term Goals - 10/27/19 1116      PT SHORT TERM GOAL #1   Title Patient will demonstrate ability to ambulate >400 ft on indoor level surfaces with LRAD and CGA to demonstrate improved mobility    Baseline 422 ft, Min Guard    Time 4    Period Weeks    Status Achieved    Target Date 10/29/19       PT SHORT TERM GOAL #2   Title Patient will demonstrate ability to ascend/descend 1 curb with [employer] and RW to demo improved entrance/exit of home    Baseline Patient able to ascend/descend single curb with CGA/Min Guard and RW.    Time 4    Period Weeks    Status Achieved    Target Date 10/29/19             PT Long Term Goals - 10/01/19 1237  PT LONG TERM GOAL #1   Title Patient will be independent with Final HEP, with wifes assistance (ALL LTG's Due: 11/26/2019)    Baseline Continue to progress HEP at this time    Time 8    Period Weeks    Status On-going    Target Date 11/26/19      PT LONG TERM GOAL #2   Title Patient will demonstrate ability to complete all transfers, Mod I, LRAD to demonstrate improved independence with functional mobility    Baseline CGA on 7/22    Time 8    Period Weeks    Status New    Target Date 11/26/19      PT LONG TERM GOAL #3   Title Patient will demo ability to ambulate >200 ft on outdoor/unlevel surfaces to demo improved community mobility    Baseline not yet assessed    Time 8    Period Weeks    Status New    Target Date 11/26/19      PT LONG TERM GOAL #4   Title Patient will demonstrate ability to complete sit <> stand, Mod I, w/ LRAD to demonstrate improved functional mobility    Baseline Supv Level 7/22    Time 8    Period Weeks    Status Revised    Target Date 11/26/19      PT LONG TERM GOAL #5   Title Patient will demo ability to ascend/descend curb with LRAD and Supervision to demonstrate improved ability to enter/exit home    Baseline Min A    Time 8    Period Weeks    Status New    Target Date 11/26/19                 Plan - 11/24/19 1105    Clinical Impression Statement Today's skilled PT session included continued gait training with focus on LRAD and less UE support. Increased difficuty with sequencing cane and decreased step length noted with this AD. Continued balance/strengthening activites  working on reduced UE support as well to promote improved posture and BLE strength. Will continue to progress.    Personal Factors and Comorbidities Comorbidity 3+    Comorbidities HTN, alcohol abuse (ongoing use), HLD, GERD, PUD, tobacco use    Examination-Activity Limitations Bed Mobility;Dressing;Locomotion Level;Stairs;Stand;Toileting;Transfers;Bathing    Examination-Participation Restrictions Driving;Interpersonal Relationship;Yard Work    [employer] Evolving/Moderate complexity    Rehab Potential Good    PT Frequency 2x / week    PT Duration 8 weeks    PT Treatment/Interventions ADLs/Self Care Home Management;Electrical Stimulation;DME Instruction;Moist Heat;Cryotherapy;Gait training;Stair training;Functional mobility training;Therapeutic activities;Therapeutic exercise;Balance training;Neuromuscular re-education;Patient/family education;Orthotic Fit/Training;Wheelchair mobility training;Passive range of motion    PT Next Visit Plan Continued gait training. Attempt small quad cane again. Balance activities.    Consulted and Agree with Plan of Care Patient;Family member/caregiver    Family Member Consulted Wife           Patient will benefit from skilled therapeutic intervention in order to improve the following deficits and impairments:  Abnormal gait, Decreased coordination, Difficulty walking, Impaired tone, Decreased safety awareness, Decreased endurance, Decreased activity tolerance, Pain, Decreased balance, Decreased knowledge of use of DME, Postural dysfunction, Impaired sensation, Decreased strength, Decreased mobility  Visit Diagnosis: Hemiplegia and hemiparesis following cerebral infarction affecting left dominant side (HCC)  Unsteadiness on feet  Muscle weakness (generalized)  Other abnormalities of gait and mobility  Other lack of coordination     Problem List Patient Active Problem List  Diagnosis Date Noted   Abnormality of gait  09/24/2019   AKI (acute kidney injury) ([place])    Fever    Sepsis without acute organ dysfunction (HCC)    Elevated BUN    Blood pressure increase diastolic    Labile blood pressure    Leukopenia    Benign essential HTN    Hypoalbuminemia    Cerebrovascular accident (CVA) of right basal ganglia ([place]) 07/07/2019   Acute ischemic stroke ([place])    Dyslipidemia    Dysphagia, post-stroke    Polycythemia    Stroke ([place]) 06/30/2019   Prediabetes 12/23/2016   Elevated LFTs 12/23/2016   Pure hypercholesterolemia 12/21/2016   Alcoholism ([place]) 12/21/2016   Eczema 09/18/2013   Essential hypertension 09/18/2013   Gastroesophageal reflux disease without esophagitis 09/18/2013   Tobacco abuse 09/18/2013    Jones Bales, PT, DPT 11/24/2019, 11:07 AM  [facility] [address] [place], [name], [address] Phone: [phone]   Fax:  [phone]  Name: Russell Russell MRN: [id number] Date of Birth: 1950/04/17

## 2019-11-26 ENCOUNTER — Encounter: Payer: Self-pay | Admitting: Physical Medicine & Rehabilitation

## 2019-11-26 ENCOUNTER — Encounter: Payer: BC Managed Care – PPO | Attending: Registered Nurse | Admitting: Physical Medicine & Rehabilitation

## 2019-11-26 ENCOUNTER — Other Ambulatory Visit: Payer: Self-pay

## 2019-11-26 VITALS — BP 134/100 | HR 82 | Ht 70.0 in | Wt 160.0 lb

## 2019-11-26 DIAGNOSIS — Z720 Tobacco use: Secondary | ICD-10-CM

## 2019-11-26 DIAGNOSIS — R7303 Prediabetes: Secondary | ICD-10-CM | POA: Diagnosis not present

## 2019-11-26 DIAGNOSIS — I639 Cerebral infarction, unspecified: Secondary | ICD-10-CM | POA: Diagnosis not present

## 2019-11-26 DIAGNOSIS — R269 Unspecified abnormalities of gait and mobility: Secondary | ICD-10-CM

## 2019-11-26 DIAGNOSIS — I10 Essential (primary) hypertension: Secondary | ICD-10-CM | POA: Diagnosis not present

## 2019-11-26 DIAGNOSIS — I6381 Other cerebral infarction due to occlusion or stenosis of small artery: Secondary | ICD-10-CM

## 2019-11-26 NOTE — Progress Notes (Signed)
Subjective:    Patient ID: Russell Russell, male    DOB: 02-19-1951, 69 y.o.   MRN: [id number] TELEHEALTH NOTE  Due to national recommendations of social distancing due to COVID 19, an audio/video telehealth visit is felt to be most appropriate for this patient at this time.  See Chart message from today for the patient's consent to telehealth from [place].     I verified that I am speaking with the correct person using two identifiers.  Location of patient: Home Location of provider: Office Method of communication: Telephone visit Names of participants : Zorita Pang scheduling, Jasmine December obtaining consent and vitals if available Established patient Time spent on call: 13 minutes  HPI Right-handed male with history of alcohol and tobacco use, hyperlipidemia presents for hospital follow-up after receiving CIR for multiple anterior and posterior right brain infarcts.  Wife supplements history. He was last seen in clinic on 09/24/19.  Wife has questions regarding if patient can smoke occasional cigarettes and if that would be harmful to his health.  Patient later asked the same questions.  Since last visit, patient states that he continues to be in therapies.  He states that he has follow-up appointments with neurology and cardiology.  He notes his blood pressures have improved.  He denies falls.  He states that he is using the walker more now.  He states he has not changed his diet or decreased his carbohydrate intake.  He notes improvement in strength.  Pain Inventory Average Pain 0 Pain Right Now 0 My pain is no pain  In the last 24 hours, has pain interfered with the following? General activity 0 Relation with others 0 Enjoyment of life 0 What TIME of day is your pain at its worst? no pain Sleep (in general) Good  Pain is worse with: no pain Pain improves with: no pain Relief from Meds: no pain  Mobility use a walker ability to climb  steps?  yes do you drive?  no use a wheelchair transfers alone  Function retired  Neuro/Psych trouble walking  Prior Studies Any changes since last visit?  no  Physicians involved in your care Any changes since last visit?  no   Family History  Problem Relation Age of Onset  . Colon cancer Mother   . Cancer Father   . Breast cancer Sister   . Breast cancer Sister    Social History   Socioeconomic History  . Marital status: Married    Spouse name: Not on file  . Number of children: Not on file  . Years of education: Not on file  . Highest education level: Not on file  Occupational History  . Not on file  Tobacco Use  . Smoking status: Former Smoker    Packs/day: 0.50    Years: 20.00    Pack years: 10.00    Types: Cigarettes  . Smokeless tobacco: Never Used  Substance and Sexual Activity  . Alcohol use: Yes    Alcohol/week: 8.0 standard drinks    Types: 8 Cans of beer per week    Comment: Weekends only  . Drug use: No  . Sexual activity: Not on file  Other Topics Concern  . Not on file  Social History Narrative   Married, no children.   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   Tob: 10 pack-yr hx (current as of 03/2014).   Alcohol: 8 beers and pint of  whisky on weekends only.   Denies hx of prob with drugs or alcohol.   Social Determinants of Health   Financial Resource Strain:   . Difficulty of Paying Living Expenses: Not on file  Food Insecurity:   . Worried About Charity fundraiser in the Last Year: Not on file  . Ran Out of Food in the Last Year: Not on file  Transportation Needs:   . Lack of Transportation (Medical): Not on file  . Lack of Transportation (Non-Medical): Not on file  Physical Activity:   . Days of Exercise per Week: Not on file  . Minutes of Exercise per Session: Not on file  Stress:   . Feeling of Stress : Not on file  Social Connections:   . Frequency of Communication with  Friends and Family: Not on file  . Frequency of Social Gatherings with Friends and Family: Not on file  . Attends Religious Services: Not on file  . Active Member of Clubs or Organizations: Not on file  . Attends Archivist Meetings: Not on file  . Marital Status: Not on file   Past Surgical History:  Procedure Laterality Date  . COLONOSCOPY  2005   Recall 10 yrs ([place])  . LOOP RECORDER INSERTION N/A 07/03/2019   Procedure: LOOP RECORDER INSERTION;  Surgeon: Thompson Grayer, MD;  Location: [facility];  Service: Cardiovascular;  Laterality: N/A;  . removal of kidney stones  2006   cystoscopic--removed from ureter.  No prob since.   Past Medical History:  Diagnosis Date  . Alcohol abuse    drinks 1 gallon of Gin every weekend, nothing during the week x >15 yrs  . Elevated transaminase level    +elev bili: abd u/s 06/2014 showed stable small hepatic hemangiomas, o/w normal.  . Essential hypertension   . GERD (gastroesophageal reflux disease)   . Hyperlipidemia 03/2014   Atorv started-chol improved  . Impaired fasting glucose 03/2014  . Nephrolithiasis   . PUD (peptic ulcer disease)   . Stroke ([place])   . Tobacco dependence    chantix: "psych effects"   BP (!) 134/100 Comment: pt reported, virtual visit  Pulse 82 Comment: pt reported, virtual visit  Ht 5\' 10"  (1.778 m) Comment: pt reported, virtual visit  Wt 160 lb (72.6 kg) Comment: pt reported, virtual visit  BMI 22.96 kg/m   Opioid Risk Score:   Fall Risk Score:  `1  Depression screen PHQ 2/9  Depression screen [facility] 2/9 08/18/2019 08/17/2019 02/24/2019 12/21/2017 12/21/2016  Decreased Interest 0 1 0 0 0  Down, Depressed, Hopeless 0 0 0 0 0  PHQ - 2 Score 0 1 0 0 0  Altered sleeping - 0 - - -  Tired, decreased energy - 0 - - -  Change in appetite - 0 - - -  Feeling bad or failure about yourself  - 0 - - -  Trouble concentrating - 0 - - -  Moving slowly or fidgety/restless - 0 - - -  Suicidal thoughts -  0 - - -  PHQ-9 Score - 1 - - -    Review of Systems  Constitutional: Negative.   HENT: Negative.   Eyes: Negative.   Respiratory: Negative.   Cardiovascular: Negative.   Gastrointestinal: Negative.   Endocrine: Negative.   Genitourinary: Negative.   Musculoskeletal: Positive for gait problem.  Skin: Negative.   Allergic/Immunologic: Negative.   Hematological: Negative.   Psychiatric/Behavioral: Negative.   All other systems reviewed and  are negative.      Objective:   Physical Exam Constitutional: NAD. Respiratory: Normal effort. Psych: Normal mood.  Normal behavior. Neuro:  Alert Dysarthria,  stable    Assessment & Plan:  Right-handed male with history of alcohol and tobacco use, hyperlipidemia presents for hospital follow-up after receiving CIR for multiple anterior and posterior right brain infarcts.  1.  Left side weakness secondary to multiple scattered anterior and posterior right brain infarcts.  Status post loop recorder             Continue therapies, notes reviewed-working on use of cane.  Continue follow up with Neurology  Continue to follow up with Cards  2.  Blood pressure  Improving per patient  3.  History of tobacco and alcohol use.    Cont to abstain-educated wife and patient again on risks associated with tobacco abuse.  4. Gait abnormality  Continue therapies  Continue walker for safety-attempting to transition to cane  5. Prediabetes   Discussed and encouraged diabetic diet, reminded again

## 2019-11-27 ENCOUNTER — Ambulatory Visit: Payer: Medicare Other

## 2019-11-27 ENCOUNTER — Other Ambulatory Visit: Payer: Self-pay

## 2019-11-27 DIAGNOSIS — R278 Other lack of coordination: Secondary | ICD-10-CM

## 2019-11-27 DIAGNOSIS — M6281 Muscle weakness (generalized): Secondary | ICD-10-CM

## 2019-11-27 DIAGNOSIS — I69352 Hemiplegia and hemiparesis following cerebral infarction affecting left dominant side: Secondary | ICD-10-CM

## 2019-11-27 DIAGNOSIS — R2681 Unsteadiness on feet: Secondary | ICD-10-CM

## 2019-11-27 DIAGNOSIS — R2689 Other abnormalities of gait and mobility: Secondary | ICD-10-CM

## 2019-11-27 NOTE — Therapy (Signed)
[facility] [address] [place], [name], [address] Phone: [phone]   Fax:  [phone]  Physical Therapy Treatment  Patient Details  Name: Russell Russell MRN: [id number] Date of Birth: 1950/09/19 Referring Provider (PT): Lauraine Rinne, PA-C   Encounter Date: 11/27/2019   PT End of Session - 11/27/19 0850    Visit Number 29    Number of Visits 31    Date for PT Re-Evaluation 12/30/19   new POC for 8 weeks, Cert for 90 days   Authorization Type BCBS (follow medicare guidelines, 10th visit PN)    Progress Note Due on Visit 20    PT Start Time 0847    PT Stop Time 0929    PT Time Calculation (min) 42 min    Equipment Utilized During Treatment Gait belt    Activity Tolerance Patient tolerated treatment well    Behavior During Therapy [facility] for tasks assessed/performed           Past Medical History:  Diagnosis Date  . Alcohol abuse    drinks 1 gallon of Gin every weekend, nothing during the week x >15 yrs  . Elevated transaminase level    +elev bili: abd u/s 06/2014 showed stable small hepatic hemangiomas, o/w normal.  . Essential hypertension   . GERD (gastroesophageal reflux disease)   . Hyperlipidemia 03/2014   Atorv started-chol improved  . Impaired fasting glucose 03/2014  . Nephrolithiasis   . PUD (peptic ulcer disease)   . Stroke ([place])   . Tobacco dependence    chantix: "psych effects"    Past Surgical History:  Procedure Laterality Date  . COLONOSCOPY  2005   Recall 10 yrs ([place])  . LOOP RECORDER INSERTION N/A 07/03/2019   Procedure: LOOP RECORDER INSERTION;  Surgeon: Thompson Grayer, MD;  Location: [facility];  Service: Cardiovascular;  Laterality: N/A;  . removal of kidney stones  2006   cystoscopic--removed from ureter.  No prob since.    There were no vitals filed for this visit.   Subjective Assessment - 11/27/19 0849    Subjective Patient reports have telehealth visit with  Dr. Posey Pronto that went well. No changes to report since last visit. No pain.    Patient is accompained by: Family member    Pertinent History PMH: HTN, alcohol abuse (ongoing use), HLD, GERD, PUD, tobacco use    Limitations Standing;Walking;House hold activities    Patient Stated Goals return to prior level of function, walk, "wheelchair is not apart of me" (wants to get out of the w/c)    Currently in Pain? No/denies                             [facility] Adult PT Treatment/Exercise - 11/27/19 0001      Transfers   Transfers Sit to Stand;Stand to Sit    Sit to Stand 5: Supervision;With upper extremity assist;From chair/3-in-1    Sit to Stand Details Verbal cues for sequencing;Verbal cues for precautions/safety;Verbal cues for safe use of DME/AE    Stand to Sit 5: Supervision;With upper extremity assist;To bed;To chair/3-in-1    Stand to Sit Details (indicate cue type and reason) Verbal cues for sequencing;Verbal cues for technique;Verbal cues for safe use of DME/AE      Ambulation/Gait   Ambulation/Gait Yes    Ambulation/Gait Assistance 4: Min guard    Ambulation/Gait Assistance Details completed ambulation with RW x 115 ft with patient  require minimal verbal cues for improved step length. Progressed to completing ambulation in // bars with single HHA 10' x 6 reps, and then progressed to ambulating outside of // bars with HHA from PT x 29 ft. Increased verbal cues required for improved step length with HHA.     Ambulation Distance (Feet) 115 Feet   x 1 w/ RW, 29 x 1 with HHA, 10' x 6   Assistive device Rolling walker    Gait Pattern Step-through pattern;Step-to pattern;Decreased stride length;Decreased stance time - right;Decreased step length - left    Ambulation Surface Level;Indoor    Stairs Yes    Stairs Assistance 4: Min guard    Stairs Assistance Details (indicate cue type and reason) completed ascending/descending stairs with bilateral rails step too patern x 8 steps.  progressed to completing with single R on R. Increased assistance required with single rail.     Stair Management Technique One rail Right;Two rails;Step to pattern;Forwards    Number of Stairs 12    Height of Stairs 6      Neuro Re-ed    Neuro Re-ed Details  Standing in // bars: completed forward/backward stepps over black balance beam with LLE, increased difficulty noted with clearing balanace beam requiring verbal.tactile cues from PT. Completed static standing in // bars on rockerboard positioned ant/post with focus on trying to maintain balance without UE support. PT providing verbal cues for upright posture, patient demo hesistancy standing without UE support, able to complete without UE support for <10 secs x 5 reps. Completed with board positioned laterally, completed alternating lateral weight shift x 10 reps, PT providing verbal cues for improved completion/technique.                     PT Short Term Goals - 10/27/19 1116      PT SHORT TERM GOAL #1   Title Patient will demonstrate ability to ambulate >400 ft on indoor level surfaces with LRAD and CGA to demonstrate improved mobility    Baseline 422 ft, Min Guard    Time 4    Period Weeks    Status Achieved    Target Date 10/29/19      PT SHORT TERM GOAL #2   Title Patient will demonstrate ability to ascend/descend 1 curb with [employer] and RW to demo improved entrance/exit of home    Baseline Patient able to ascend/descend single curb with CGA/Min Guard and RW.    Time 4    Period Weeks    Status Achieved    Target Date 10/29/19             PT Long Term Goals - 10/01/19 1237      PT LONG TERM GOAL #1   Title Patient will be independent with Final HEP, with wifes assistance (ALL LTG's Due: 11/26/2019)    Baseline Continue to progress HEP at this time    Time 8    Period Weeks    Status On-going    Target Date 11/26/19      PT LONG TERM GOAL #2   Title Patient will demonstrate ability to complete all  transfers, Mod I, LRAD to demonstrate improved independence with functional mobility    Baseline CGA on 7/22    Time 8    Period Weeks    Status New    Target Date 11/26/19      PT LONG TERM GOAL #3   Title Patient will demo ability to ambulate >200 ft  on outdoor/unlevel surfaces to demo improved community mobility    Baseline not yet assessed    Time 8    Period Weeks    Status New    Target Date 11/26/19      PT LONG TERM GOAL #4   Title Patient will demonstrate ability to complete sit <> stand, Mod I, w/ LRAD to demonstrate improved functional mobility    Baseline Supv Level 7/22    Time 8    Period Weeks    Status Revised    Target Date 11/26/19      PT LONG TERM GOAL #5   Title Patient will demo ability to ascend/descend curb with LRAD and Supervision to demonstrate improved ability to enter/exit home    Baseline Min A    Time 8    Period Weeks    Status New    Target Date 11/26/19                 Plan - 11/27/19 1101    Clinical Impression Statement Today's skilled PT session included continued gait training and stair training working toward less UE assitance and improved step length. Continued activites promoting lateral weight shifting and balance as tolerated by patient. Will continue to progress toward all LTGs.    Personal Factors and Comorbidities Comorbidity 3+    Comorbidities HTN, alcohol abuse (ongoing use), HLD, GERD, PUD, tobacco use    Examination-Activity Limitations Bed Mobility;Dressing;Locomotion Level;Stairs;Stand;Toileting;Transfers;Bathing    Examination-Participation Restrictions Driving;Interpersonal Relationship;Yard Work    [employer] Evolving/Moderate complexity    Rehab Potential Good    PT Frequency 2x / week    PT Duration 8 weeks    PT Treatment/Interventions ADLs/Self Care Home Management;Electrical Stimulation;DME Instruction;Moist Heat;Cryotherapy;Gait training;Stair training;Functional mobility  training;Therapeutic activities;Therapeutic exercise;Balance training;Neuromuscular re-education;Patient/family education;Orthotic Fit/Training;Wheelchair mobility training;Passive range of motion    PT Next Visit Plan Check LTG + Re- Cert. Continued gait training. Attempt small quad cane again. Balance activities.    Consulted and Agree with Plan of Care Patient;Family member/caregiver    Family Member Consulted Wife           Patient will benefit from skilled therapeutic intervention in order to improve the following deficits and impairments:  Abnormal gait, Decreased coordination, Difficulty walking, Impaired tone, Decreased safety awareness, Decreased endurance, Decreased activity tolerance, Pain, Decreased balance, Decreased knowledge of use of DME, Postural dysfunction, Impaired sensation, Decreased strength, Decreased mobility  Visit Diagnosis: Hemiplegia and hemiparesis following cerebral infarction affecting left dominant side (HCC)  Unsteadiness on feet  Muscle weakness (generalized)  Other abnormalities of gait and mobility  Other lack of coordination     Problem List Patient Active Problem List   Diagnosis Date Noted  . Abnormality of gait 09/24/2019  . AKI (acute kidney injury) ([place])   . Fever   . Sepsis without acute organ dysfunction ([place])   . Elevated BUN   . Blood pressure increase diastolic   . Labile blood pressure   . Leukopenia   . Benign essential HTN   . Hypoalbuminemia   . Cerebrovascular accident (CVA) of right basal ganglia ([place]) 07/07/2019  . Acute ischemic stroke ([place])   . Dyslipidemia   . Dysphagia, post-stroke   . Polycythemia   . Stroke ([place]) 06/30/2019  . Prediabetes 12/23/2016  . Elevated LFTs 12/23/2016  . Pure hypercholesterolemia 12/21/2016  . Alcoholism ([place]) 12/21/2016  . Eczema 09/18/2013  . Essential hypertension 09/18/2013  . Gastroesophageal reflux disease without esophagitis 09/18/2013  . Tobacco abuse 09/18/2013  Jones Bales, PT, DPT 11/27/2019, 11:02 AM  [facility] [address] [place], [name], [address] Phone: [phone]   Fax:  [phone]  Name: Russell Russell MRN: [id number] Date of Birth: [birth year]

## 2019-12-01 ENCOUNTER — Encounter: Payer: Self-pay | Admitting: Occupational Therapy

## 2019-12-01 ENCOUNTER — Ambulatory Visit: Payer: Medicare Other | Admitting: Occupational Therapy

## 2019-12-01 ENCOUNTER — Other Ambulatory Visit: Payer: Self-pay

## 2019-12-01 ENCOUNTER — Encounter: Payer: Self-pay | Admitting: Physical Therapy

## 2019-12-01 ENCOUNTER — Ambulatory Visit: Payer: Medicare Other | Admitting: Physical Therapy

## 2019-12-01 DIAGNOSIS — I69352 Hemiplegia and hemiparesis following cerebral infarction affecting left dominant side: Secondary | ICD-10-CM

## 2019-12-01 DIAGNOSIS — R2681 Unsteadiness on feet: Secondary | ICD-10-CM

## 2019-12-01 DIAGNOSIS — M6281 Muscle weakness (generalized): Secondary | ICD-10-CM

## 2019-12-01 DIAGNOSIS — R208 Other disturbances of skin sensation: Secondary | ICD-10-CM

## 2019-12-01 DIAGNOSIS — R41840 Attention and concentration deficit: Secondary | ICD-10-CM

## 2019-12-01 DIAGNOSIS — R278 Other lack of coordination: Secondary | ICD-10-CM

## 2019-12-01 DIAGNOSIS — R482 Apraxia: Secondary | ICD-10-CM

## 2019-12-01 DIAGNOSIS — R2689 Other abnormalities of gait and mobility: Secondary | ICD-10-CM

## 2019-12-01 NOTE — Therapy (Signed)
[facility] [address] [place], [name], [address] Phone: [phone]   Fax:  [phone]  Occupational Therapy Treatment and Progress Update  Patient Details  Name: Russell Russell MRN: [id number] Date of Birth: [birth year] Referring Provider (OT): [name]  This progress report covers dates of service from 10/05/19- 12/01/19  Encounter Date: 12/01/2019   OT End of Session - 12/01/19 1433    Visit Number 18    Number of Visits 25    Date for OT Re-Evaluation 12/04/19    Authorization Type BCBS? "Follow Medicare Guidelines"    Authorization - Number of Visits 18    Progress Note Due on Visit 28    OT Start Time 1015    OT Stop Time 1100    OT Time Calculation (min) 45 min    Activity Tolerance Patient tolerated treatment well    Behavior During Therapy [facility] for tasks assessed/performed           Past Medical History:  Diagnosis Date  . Alcohol abuse    drinks 1 gallon of Gin every weekend, nothing during the week x >15 yrs  . Elevated transaminase level    +elev bili: abd u/s 06/2014 showed stable small hepatic hemangiomas, o/w normal.  . Essential hypertension   . GERD (gastroesophageal reflux disease)   . Hyperlipidemia 03/2014   Atorv started-chol improved  . Impaired fasting glucose 03/2014  . Nephrolithiasis   . PUD (peptic ulcer disease)   . Stroke ([place])   . Tobacco dependence    chantix: "psych effects"    Past Surgical History:  Procedure Laterality Date  . COLONOSCOPY  2005   Recall 10 yrs ([place])  . LOOP RECORDER INSERTION N/A 07/03/2019   Procedure: LOOP RECORDER INSERTION;  Surgeon: Thompson Grayer, MD;  Location: [facility];  Service: Cardiovascular;  Laterality: N/A;  . removal of kidney stones  2006   cystoscopic--removed from ureter.  No prob since.    There were no vitals filed for this visit.   Subjective Assessment - 12/01/19 1023    Subjective  Nothing new at home     Patient is accompanied by: Family member    Pertinent History HTN, ETOH use, smoker, loop recorder    Currently in Pain? No/denies    Pain Score 0-No pain                        OT Treatments/Exercises (OP) - 12/01/19 1038      ADLs   Functional Mobility Worked on functional mobility as needed for toileting and showering - standing, stepping, turning, reversing, side stepping.  Patient required cueing, but not physical assistance as able to clear left foot better during maneuvering with walker today.  Patient continues to gaze at feet with walking.  Reviewed remaining long term goals and plan for OT to finish up this riginal plan of care for number of visits - over extended timeframe.        Neurological Re-education Exercises   Other Exercises 1 Patient still with faulty mechanics in left wrist leading to wrist malalignment and pain.  Provided manula realignment and carpal mobility followed with weight bearing into heel of hand and weight shifting forward to increase wrist extension stretch.                      OT Short Term Goals - 12/01/19 1050      OT SHORT  TERM GOAL #1   Title Patient will compelte a home exercise program designed to improve functional use of LUE with mod cueing and set up due 09/05/19    Time 4    Period Weeks    Status Achieved    Target Date 09/05/19      OT SHORT TERM GOAL #2   Title Patient will don a front opening shirt with fasteners (buttons/zipper) with mod assist    Time 4    Period Weeks    Status Achieved      OT SHORT TERM GOAL #3   Title Patient will demonstrate ability to cut food on plate with min assist    Time 4    Period Weeks    Status Achieved      OT SHORT TERM GOAL #4   Title Patient will demonstrate low reach, grasp, release of lightweight small object (less than 1 lb) with min assist    Time 4    Period Weeks    Status Achieved      OT SHORT TERM GOAL #5   Title Patient will ambulate into bathroom  and cmplete toileting with supervision and verbal cueing assistance due 11/04/19    Time 4    Period Weeks    Status Achieved      OT SHORT TERM GOAL #6   Title Patient will transport necessary items for bathing and dressing into bathroom to encourage greater independence with showering.    Status Achieved             OT Long Term Goals - 12/01/19 1051      OT LONG TERM GOAL #1   Title Patient will complete an updated HEP to address LUE functioning due 10/05/19    Status Achieved      OT LONG TERM GOAL #2   Title Patient will walk with assistive device into bathroom to use toilet with min assist    Status Achieved      OT LONG TERM GOAL #3   Title Patient will transfer onto shower seat with min assist    Status Achieved      OT LONG TERM GOAL #4   Title Patient will obtain and release a 2-3 lb object at chest height shelf using LUE and min assist    Status Achieved      OT LONG TERM GOAL #5   Title Patient will toilet himself with modified independence due 12/04/19    Time 8    Period Weeks    Status Achieved      Long Term Additional Goals   Additional Long Term Goals Yes      OT LONG TERM GOAL #6   Title Patient will shower self with supervision/set up assistance    Time 8    Period Weeks    Status Achieved      OT LONG TERM GOAL #7   Title Patient will grocery shop with wife, and will locate and retrieve at least 5 items from list with only minimal assistance due 01/30/20    Time 8    Period Weeks    Status On-going    Target Date 01/30/20      OT LONG TERM GOAL #8   Title Patient will demonstrate at least 60* of active wrist extension without report of wrist pain (left)    Time 4    Period Weeks    Status New  Plan - 12/01/19 1050    Clinical Impression Statement Patient continues to show steady improvement with functional mobility, balance, safety, and active controlled movement in LUE.  Patient is benefitting from OT and will  recertify to extend the date to achieve initially planned number of visits.    OT Occupational Profile and History Detailed Assessment- Review of Records and additional review of physical, cognitive, psychosocial history related to current functional performance    Occupational performance deficits (Please refer to evaluation for details): ADL's;IADL's;Rest and Sleep;Leisure    Body Structure / Function / Physical Skills ADL;Coordination;Endurance;GMC;UE functional use;Sensation;Decreased knowledge of precautions;Balance;Body mechanics;Decreased knowledge of use of DME;Flexibility;IADL;Pain;Vision;Cardiopulmonary status limiting activity;Dexterity;FMC;Proprioception;Strength;Tone;ROM    Cognitive Skills Attention;Orientation;Thought;Perception;Problem Solve;Safety Awareness;Sequencing;Memory;Learn    Psychosocial Skills Habits;Routines and Behaviors    Rehab Potential Good    Clinical Decision Making Several treatment options, min-mod task modification necessary    Comorbidities Affecting Occupational Performance: May have comorbidities impacting occupational performance    Modification or Assistance to Complete Evaluation  Min-Moderate modification of tasks or assist with assess necessary to complete eval    OT Frequency 2x / week    OT Duration 8 weeks    OT Treatment/Interventions Self-care/ADL training;Electrical Stimulation;Therapeutic exercise;Visual/perceptual remediation/compensation;Patient/family education;Splinting;Neuromuscular education;Moist Heat;Aquatic Therapy;Balance training;Therapeutic activities;Functional Mobility Training;Fluidtherapy;DME and/or AE instruction;Manual Therapy;Cognitive remediation/compensation    Plan continue to address functional mobility  - walking and looking up/down left/right, and mid to high range functional reach    OT Home Exercise Plan coordination HEP    Consulted and Agree with Plan of Care Patient;Family member/caregiver    Family Member Consulted  Wife, [name]           Patient will benefit from skilled therapeutic intervention in order to improve the following deficits and impairments:   Body Structure / Function / Physical Skills: ADL, Coordination, Endurance, GMC, UE functional use, Sensation, Decreased knowledge of precautions, Balance, Body mechanics, Decreased knowledge of use of DME, Flexibility, IADL, Pain, Vision, Cardiopulmonary status limiting activity, Dexterity, FMC, Proprioception, Strength, Tone, ROM Cognitive Skills: Attention, Orientation, Thought, Perception, Problem Solve, Safety Awareness, Sequencing, Memory, Learn Psychosocial Skills: Habits, Routines and Behaviors   Visit Diagnosis: Hemiplegia and hemiparesis following cerebral infarction affecting left dominant side (HCC) - Plan: Ot plan of care cert/re-cert  Unsteadiness on feet - Plan: Ot plan of care cert/re-cert  Muscle weakness (generalized) - Plan: Ot plan of care cert/re-cert  Other lack of coordination - Plan: Ot plan of care cert/re-cert  Other disturbances of skin sensation - Plan: Ot plan of care cert/re-cert  Apraxia - Plan: Ot plan of care cert/re-cert  Attention and concentration deficit - Plan: Ot plan of care cert/re-cert    Problem List Patient Active Problem List   Diagnosis Date Noted  . Abnormality of gait 09/24/2019  . AKI (acute kidney injury) ([place])   . Fever   . Sepsis without acute organ dysfunction ([place])   . Elevated BUN   . Blood pressure increase diastolic   . Labile blood pressure   . Leukopenia   . Benign essential HTN   . Hypoalbuminemia   . Cerebrovascular accident (CVA) of right basal ganglia ([place]) 07/07/2019  . Acute ischemic stroke ([place])   . Dyslipidemia   . Dysphagia, post-stroke   . Polycythemia   . Stroke ([place]) 06/30/2019  . Prediabetes 12/23/2016  . Elevated LFTs 12/23/2016  . Pure hypercholesterolemia 12/21/2016  . Alcoholism ([place]) 12/21/2016  . Eczema 09/18/2013  . Essential hypertension  09/18/2013  . Gastroesophageal reflux disease without esophagitis 09/18/2013  .  Tobacco abuse 09/18/2013    Mariah Milling, OTR/L 12/01/2019, 2:43 PM  [facility] [address] [place], [name], [address] Phone: [phone]   Fax:  [phone]  Name: Russell Russell MRN: [id number] Date of Birth: 01/25/1951

## 2019-12-01 NOTE — Therapy (Addendum)
[facility] [address] [place], [name], [address] Phone: [phone]   Fax:  [phone]  Physical Therapy Treatment/Progress Note/Re-Certification  Patient Details  Name: Russell Russell MRN: [id number] Date of Birth: 12-04-1950 Referring Provider (PT): Lauraine Rinne, PA-C   Encounter Date: 12/01/2019   PT End of Session - 12/01/19 0852    Visit Number 30    Number of Visits 64    Date for PT Re-Evaluation 03/02/20   new POC for 8 weeks, Cert for 90 days   Authorization Type BCBS (follow medicare guidelines, 10th visit PN)    Progress Note Due on Visit 30    PT Start Time 0849    PT Stop Time 0930    PT Time Calculation (min) 41 min    Equipment Utilized During Treatment Gait belt    Activity Tolerance Patient tolerated treatment well    Behavior During Therapy [facility] for tasks assessed/performed           Past Medical History:  Diagnosis Date  . Alcohol abuse    drinks 1 gallon of Gin every weekend, nothing during the week x >15 yrs  . Elevated transaminase level    +elev bili: abd u/s 06/2014 showed stable small hepatic hemangiomas, o/w normal.  . Essential hypertension   . GERD (gastroesophageal reflux disease)   . Hyperlipidemia 03/2014   Atorv started-chol improved  . Impaired fasting glucose 03/2014  . Nephrolithiasis   . PUD (peptic ulcer disease)   . Stroke ([place])   . Tobacco dependence    chantix: "psych effects"    Past Surgical History:  Procedure Laterality Date  . COLONOSCOPY  2005   Recall 10 yrs ([place])  . LOOP RECORDER INSERTION N/A 07/03/2019   Procedure: LOOP RECORDER INSERTION;  Surgeon: Thompson Grayer, MD;  Location: [facility];  Service: Cardiovascular;  Laterality: N/A;  . removal of kidney stones  2006   cystoscopic--removed from ureter.  No prob since.    There were no vitals filed for this visit.   Subjective Assessment - 12/01/19 0851    Subjective No new  complaints. No falls or pain to report. HEP is going well, still challenging per pt and spouse report.    Patient is accompained by: Family member    Pertinent History PMH: HTN, alcohol abuse (ongoing use), HLD, GERD, PUD, tobacco use    Limitations Standing;Walking;House hold activities    Patient Stated Goals return to prior level of function, walk, "wheelchair is not apart of me" (wants to get out of the w/c)    Currently in Pain? No/denies    Pain Score 0-No pain              [facility] PT Assessment - 12/01/19 0855      Standardized Balance Assessment   Standardized Balance Assessment Berg Balance Test;10 meter walk test;Timed Up and Go Test    10 Meter Walk 28.91 sec's with RW= 1.13 ft/sec with RW      Berg Balance Test   Sit to Stand Able to stand  independently using hands    Standing Unsupported Able to stand 2 minutes with supervision    Sitting with Back Unsupported but Feet Supported on Floor or Stool Able to sit safely and securely 2 minutes    Stand to Sit Uses backs of legs against chair to control descent    Transfers Able to transfer with verbal cueing and /or supervision    Standing Unsupported with  Eyes Closed Able to stand 10 seconds with supervision    Standing Unsupported with Feet Together Able to place feet together independently and stand for 1 minute with supervision    From Standing, Reach Forward with Outstretched Arm Can reach forward >5 cm safely (2")   3 inches   From Standing Position, Pick up Object from [place] to pick up shoe, needs supervision    From Standing Position, Turn to Look Behind Over each Shoulder Turn sideways only but maintains balance   left>right   Turn 360 Degrees Needs assistance while turning    Standing Unsupported, Alternately Place Feet on Step/Stool Able to complete >2 steps/needs minimal assist   8 steps in 30.69 sec's with min guard to min assist   Standing Unsupported, One Foot in [place] to take small step independently  and hold 30 seconds    Standing on One Leg Tries to lift leg/unable to hold 3 seconds but remains standing independently    Total Score 31    Berg comment: 31/56 today. <36 high risk for falls      Timed Up and Go Test   TUG Normal TUG    Normal TUG (seconds) 38.87   with RW              [facility] Adult PT Treatment/Exercise - 12/01/19 0855      Transfers   Transfers Sit to Stand;Stand to Sit    Sit to Stand 5: Supervision;With upper extremity assist;From chair/3-in-1    Sit to Stand Details Verbal cues for sequencing;Verbal cues for precautions/safety;Verbal cues for safe use of DME/AE    Stand to Sit 5: Supervision;With upper extremity assist;To bed;To chair/3-in-1    Stand to Sit Details (indicate cue type and reason) Verbal cues for sequencing;Verbal cues for technique;Verbal cues for safe use of DME/AE    Stand Pivot Transfers 5: Supervision      Ambulation/Gait   Ambulation/Gait Yes    Ambulation/Gait Assistance 4: Min guard    Ambulation/Gait Assistance Details max cues to look up with gait, stay within walker    Ambulation Distance (Feet) 210 Feet   x1   Assistive device Rolling walker    Gait Pattern Step-through pattern;Step-to pattern;Decreased stride length;Decreased stance time - right;Decreased step length - left    Ambulation Surface Level;Indoor               PT Short Term Goals - 10/27/19 1116      PT SHORT TERM GOAL #1   Title Patient will demonstrate ability to ambulate >400 ft on indoor level surfaces with LRAD and CGA to demonstrate improved mobility    Baseline 422 ft, Min Guard    Time 4    Period Weeks    Status Achieved    Target Date 10/29/19      PT SHORT TERM GOAL #2   Title Patient will demonstrate ability to ascend/descend 1 curb with [employer] and RW to demo improved entrance/exit of home    Baseline Patient able to ascend/descend single curb with CGA/Min Guard and RW.    Time 4    Period Weeks    Status Achieved    Target Date  10/29/19             PT Long Term Goals - 12/01/19 0853      PT LONG TERM GOAL #1   Title Patient will be independent with Final HEP, with wifes assistance (ALL LTG's Due: 11/26/2019)    Baseline  12/01/19: met per pt and spouse with current HEP, will benefit from updates as pt progresses    Status Achieved      PT LONG TERM GOAL #2   Title Patient will demonstrate ability to complete all transfers, Mod I, LRAD to demonstrate improved independence with functional mobility    Baseline 12/01/19: supervision with cues for safety at times, improved from min guard assist    Time --    Period --    Status Partially [place] #3   Title Patient will demo ability to ambulate >200 ft on outdoor/unlevel surfaces to demo improved community mobility    Baseline 12/01/19: unable to assess today due to weather, met distance at min guard to supervision on indoor surfaces    Status Unable to assess      PT LONG TERM GOAL #4   Title Patient will demonstrate ability to complete sit <> stand, Mod I, w/ LRAD to demonstrate improved functional mobility    Baseline 12/01/19: supervision with cues for safety needed at times    Time --    Period --    Status Not Met      PT LONG TERM GOAL #5   Title Patient will demo ability to ascend/descend curb with LRAD and Supervision to demonstrate improved ability to enter/exit home    Baseline 12/01/19: min guard assist with cues, improved from min assist- all with RW    Time --    Period --    Status Partially Met          All Goals Updated by Primary PT: Guillermina City, PT, DPT Updated Short Term Goals:   PT Short Term Goals - 12/03/19 1501      PT SHORT TERM GOAL #1   Title Patient will improve TUG to </= 30 seconds with LRAD to demo reduced fall risk (All STGs Due: 12/31/19)    Baseline 38.87 secs    Time 4    Period Weeks    Status New    Target Date 12/31/19      PT SHORT TERM GOAL #2   Title Patient will improve Berg Balance to  >/= 35/56 to demonstrate improved balance and reduced fall risk    Baseline 31/56    Time 4    Period Weeks    Status New      PT SHORT TERM GOAL #3   Title Patient will improve gait speed to >/= 1.5 ft/sec with LRAD to demo improved mobility    Baseline 1.13 ft/sec    Time 4    Period Weeks    Status New          Updated Long Term Goals:   PT Long Term Goals - 12/01/19 9833      PT LONG TERM GOAL #1   Title Patient will be independent with walking program 5x/day week with wife assistance (ALL LTG Due:01/28/2020)    Baseline TBD    Time 8    Period Weeks    Status New    Target Date 01/28/20      PT LONG TERM GOAL #2   Title Patient will demonstrate ability to complete all transfers w/ LRAD at Mod I Level to demonstrate improved independence with functional mobility    Baseline supervision with cues for safety at times, improved from min guard assist    Time --    Period --    Status Revised  PT LONG TERM GOAL #3   Title Patient will demo ability to ambulate >400 ft on outdoor/unlevel surfaces with CGA to demo improved community mobility    Baseline unable to assess today due to weather    Time 8    Period Weeks    Status Revised      PT LONG TERM GOAL #4   Title Patient will improve TUG to </= 25 seconds to demonstrate improved mobility and reduced fall risk    Baseline 38.87 secs    Time 8    Period Weeks    Status New      PT LONG TERM GOAL #5   Title Patient will improve Berg Balance to >/= 40/56 to demonstrate reduced risk for falls    Baseline 31/56    Time 8    Period Weeks    Status New      Additional Long Term Goals   Additional Long Term Goals Yes      PT LONG TERM GOAL #6   Title patient will improve gait speed to >/= 2.0 ft/sec to demonstrate improved community mobility    Baseline 1.13 ft/sec    Time 8    Period Weeks    Status New              12/01/19 0853  Plan  Clinical Impression Statement Today's skilled session focused  on progress toward LTGs with goals partially to fully met. Session also established baseline values for Berg Balance test with score of 31/56, the Timed Up and Go with score of 38.87 sec's with RW and the 10 meter gait speed with score of 1.13 ft/sec with RW. Primary PT to update goals along with recert to continue to work on balance, strengthening and gait.  Personal Factors and Comorbidities Comorbidity 3+  Comorbidities HTN, alcohol abuse (ongoing use), HLD, GERD, PUD, tobacco use  Examination-Activity Limitations Bed Mobility;Dressing;Locomotion Level;Stairs;Stand;Toileting;Transfers;Bathing  Examination-Participation Restrictions Driving;Interpersonal Relationship;Yard Work  Pt will benefit from skilled therapeutic intervention in order to improve on the following deficits Abnormal gait;Decreased coordination;Difficulty walking;Impaired tone;Decreased safety awareness;Decreased endurance;Decreased activity tolerance;Pain;Decreased balance;Decreased knowledge of use of DME;Postural dysfunction;Impaired sensation;Decreased strength;Decreased mobility  Stability/Clinical Decision Making Evolving/Moderate complexity  Rehab Potential Good  PT Frequency 2x / week  PT Duration 8 weeks  PT Treatment/Interventions ADLs/Self Care Home Management;Electrical Stimulation;DME Instruction;Moist Heat;Cryotherapy;Gait training;Stair training;Functional mobility training;Therapeutic activities;Therapeutic exercise;Balance training;Neuromuscular re-education;Patient/family education;Orthotic Fit/Training;Wheelchair mobility training;Passive range of motion  PT Next Visit Plan Continued gait training. Attempt small quad cane again,use table or counter on other side, ? toe cap for foot that keeps getting caught with swing phase. Balance activities, LE strengthening  Consulted and Agree with Plan of Care Patient;Family member/caregiver  Family Member Consulted Wife       Patient will benefit from skilled  therapeutic intervention in order to improve the following deficits and impairments:  Abnormal gait, Decreased coordination, Difficulty walking, Impaired tone, Decreased safety awareness, Decreased endurance, Decreased activity tolerance, Pain, Decreased balance, Decreased knowledge of use of DME, Postural dysfunction, Impaired sensation, Decreased strength, Decreased mobility  Visit Diagnosis: Hemiplegia and hemiparesis following cerebral infarction affecting left dominant side (HCC)  Unsteadiness on feet  Muscle weakness (generalized)  Other abnormalities of gait and mobility     Problem List Patient Active Problem List   Diagnosis Date Noted  . Abnormality of gait 09/24/2019  . AKI (acute kidney injury) ([place])   . Fever   . Sepsis without acute organ dysfunction ([place])   . Elevated BUN   .  Blood pressure increase diastolic   . Labile blood pressure   . Leukopenia   . Benign essential HTN   . Hypoalbuminemia   . Cerebrovascular accident (CVA) of right basal ganglia ([place]) 07/07/2019  . Acute ischemic stroke ([place])   . Dyslipidemia   . Dysphagia, post-stroke   . Polycythemia   . Stroke ([place]) 06/30/2019  . Prediabetes 12/23/2016  . Elevated LFTs 12/23/2016  . Pure hypercholesterolemia 12/21/2016  . Alcoholism ([place]) 12/21/2016  . Eczema 09/18/2013  . Essential hypertension 09/18/2013  . Gastroesophageal reflux disease without esophagitis 09/18/2013  . Tobacco abuse 09/18/2013   Addended by: Guillermina City, PT, DPT  [name], PTA, [facility] [address] [place], [place] [address] [phone] 12/01/19, 10:11 AM   Name: Dillard Pascal MRN: [id number] Date of Birth: 1950/12/23

## 2019-12-03 NOTE — Addendum Note (Signed)
Addended by: Baldomero Lamy B on: 12/03/2019 03:11 PM   Modules accepted: Orders

## 2019-12-04 ENCOUNTER — Ambulatory Visit: Payer: Medicare Other

## 2019-12-04 ENCOUNTER — Other Ambulatory Visit: Payer: Self-pay

## 2019-12-04 ENCOUNTER — Encounter: Payer: Self-pay | Admitting: Occupational Therapy

## 2019-12-04 ENCOUNTER — Ambulatory Visit: Payer: Medicare Other | Admitting: Occupational Therapy

## 2019-12-04 DIAGNOSIS — I69352 Hemiplegia and hemiparesis following cerebral infarction affecting left dominant side: Secondary | ICD-10-CM | POA: Diagnosis not present

## 2019-12-04 DIAGNOSIS — R278 Other lack of coordination: Secondary | ICD-10-CM

## 2019-12-04 DIAGNOSIS — R2689 Other abnormalities of gait and mobility: Secondary | ICD-10-CM

## 2019-12-04 DIAGNOSIS — R2681 Unsteadiness on feet: Secondary | ICD-10-CM

## 2019-12-04 DIAGNOSIS — M6281 Muscle weakness (generalized): Secondary | ICD-10-CM

## 2019-12-04 NOTE — Therapy (Signed)
[facility] [address] [place], [name], [address] Phone: [phone]   Fax:  [phone]  Physical Therapy Treatment  Patient Details  Name: Russell Russell MRN: [id number] Date of Birth: 06/02/1950 Referring Provider (PT): Lauraine Rinne, PA-C   Encounter Date: 12/04/2019   PT End of Session - 12/04/19 0931    Visit Number 31    Number of Visits 46    Date for PT Re-Evaluation 03/02/20   new POC for 8 weeks, Cert for 90 days   Authorization Type BCBS (follow medicare guidelines, 10th visit PN)    Progress Note Due on Visit 40    PT Start Time 0929    PT Stop Time 1012    PT Time Calculation (min) 43 min    Equipment Utilized During Treatment Gait belt    Activity Tolerance Patient tolerated treatment well    Behavior During Therapy [facility] for tasks assessed/performed           Past Medical History:  Diagnosis Date  . Alcohol abuse    drinks 1 gallon of Gin every weekend, nothing during the week x >15 yrs  . Elevated transaminase level    +elev bili: abd u/s 06/2014 showed stable small hepatic hemangiomas, o/w normal.  . Essential hypertension   . GERD (gastroesophageal reflux disease)   . Hyperlipidemia 03/2014   Atorv started-chol improved  . Impaired fasting glucose 03/2014  . Nephrolithiasis   . PUD (peptic ulcer disease)   . Stroke ([place])   . Tobacco dependence    chantix: "psych effects"    Past Surgical History:  Procedure Laterality Date  . COLONOSCOPY  2005   Recall 10 yrs ([place])  . LOOP RECORDER INSERTION N/A 07/03/2019   Procedure: LOOP RECORDER INSERTION;  Surgeon: Thompson Grayer, MD;  Location: [facility];  Service: Cardiovascular;  Laterality: N/A;  . removal of kidney stones  2006   cystoscopic--removed from ureter.  No prob since.    There were no vitals filed for this visit.   Subjective Assessment - 12/04/19 0930    Subjective Patient reports no new changes/complaints  since last visit. No pain. No falls.    Patient is accompained by: Family member    Pertinent History PMH: HTN, alcohol abuse (ongoing use), HLD, GERD, PUD, tobacco use    Limitations Standing;Walking;House hold activities    Patient Stated Goals return to prior level of function, walk, "wheelchair is not apart of me" (wants to get out of the w/c)    Currently in Pain? No/denies                             [facility] Adult PT Treatment/Exercise - 12/04/19 0943      Transfers   Stand Pivot Transfers 5: Supervision    Stand Pivot Transfer Details (indicate cue type and reason) completed transfer from w/c <> mat with RW      Ambulation/Gait   Ambulation/Gait Yes    Ambulation/Gait Assistance 4: Min guard;5: Supervision    Ambulation/Gait Assistance Details Completed gait training initially with RW x 350 ft, patient demo improved step length. PT providing verbal cues for improved hip/knee flexion initially, but patient demo improved carryover and less dragging of LLE with ambulation. Completed gait training with small base quad cane 51' x 2, PT providing verbal cues for improved sequencing, as well as tactile/verbal cues for improved posture. Patient demo one instance of LOB  require Min A from PT to regain. Patient reports left leg fatigue with ambulation with small base quad cane. PT providing max verbal cues and CGA for turn with AD prior to sitting on mat, as patient demo difficulty with turning with small base quad cane. Rest breaks required between ambulation with small base quad cane.     Ambulation Distance (Feet) 350 Feet   x 1, 51 x 2   Assistive device Rolling walker    Gait Pattern Step-through pattern;Step-to pattern;Decreased stride length;Decreased stance time - right;Decreased step length - left    Ambulation Surface Level;Indoor      Neuro Re-ed    Neuro Re-ed Details  Standing without UE support completed reaching task to cones placed on floor working on improved  balance with reaching down and across midline to place on mat 2 x 6 cones. CGA required intermittently and verbal cues for improved posture. Standing without UE support, completed alternating toe taps to colored pebbles 2 x 10 reps, increased dififculty with toe tap w/ RLE due to decreased weight shift/acceptance on LLE.                     PT Short Term Goals - 12/03/19 1501      PT SHORT TERM GOAL #1   Title Patient will improve TUG to </= 30 seconds with LRAD to demo reduced fall risk (All STGs Due: 12/31/19)    Baseline 38.87 secs    Time 4    Period Weeks    Status New    Target Date 12/31/19      PT SHORT TERM GOAL #2   Title Patient will improve Berg Balance to >/= 35/56 to demonstrate improved balance and reduced fall risk    Baseline 31/56    Time 4    Period Weeks    Status New      PT SHORT TERM GOAL #3   Title Patient will improve gait speed to >/= 1.5 ft/sec with LRAD to demo improved mobility    Baseline 1.13 ft/sec    Time 4    Period Weeks    Status New             PT Long Term Goals - 12/01/19 4627      PT LONG TERM GOAL #1   Title Patient will be independent with walking program 5x/day week with wife assistance (ALL LTG Due:01/28/2020)    Baseline TBD    Time 8    Period Weeks    Status New    Target Date 01/28/20      PT LONG TERM GOAL #2   Title Patient will demonstrate ability to complete all transfers w/ LRAD at Mod I Level to demonstrate improved independence with functional mobility    Baseline supervision with cues for safety at times, improved from min guard assist    Time --    Period --    Status Revised      PT LONG TERM GOAL #3   Title Patient will demo ability to ambulate >400 ft on outdoor/unlevel surfaces with CGA to demo improved community mobility    Baseline unable to assess today due to weather    Time 8    Period Weeks    Status Revised      PT LONG TERM GOAL #4   Title Patient will improve TUG to </= 25 seconds  to demonstrate improved mobility and reduced fall risk    Baseline 38.87 secs  Time 8    Period Weeks    Status New      PT LONG TERM GOAL #5   Title Patient will improve Berg Balance to >/= 40/56 to demonstrate reduced risk for falls    Baseline 31/56    Time 8    Period Weeks    Status New      Additional Long Term Goals   Additional Long Term Goals Yes      PT LONG TERM GOAL #6   Title patient will improve gait speed to >/= 2.0 ft/sec to demonstrate improved community mobility    Baseline 1.13 ft/sec    Time 8    Period Weeks    Status New                 Plan - 12/04/19 1013    Clinical Impression Statement Today's skilled session included continued gait training with RW as well as small base quad cane, increased assistance required for small base quad cane as well as increased verbal/tactile cues. Continued activities to promote improved balance including reaching task and activities to promote weight shift. Patient will continue to benefit from skilled PT services to progress toward all goals.    Personal Factors and Comorbidities Comorbidity 3+    Comorbidities HTN, alcohol abuse (ongoing use), HLD, GERD, PUD, tobacco use    Examination-Activity Limitations Bed Mobility;Dressing;Locomotion Level;Stairs;Stand;Toileting;Transfers;Bathing    Examination-Participation Restrictions Driving;Interpersonal Relationship;Yard Work    [employer] Evolving/Moderate complexity    Rehab Potential Good    PT Frequency 2x / week    PT Duration 8 weeks    PT Treatment/Interventions ADLs/Self Care Home Management;Electrical Stimulation;DME Instruction;Moist Heat;Cryotherapy;Gait training;Stair training;Functional mobility training;Therapeutic activities;Therapeutic exercise;Balance training;Neuromuscular re-education;Patient/family education;Orthotic Fit/Training;Wheelchair mobility training;Passive range of motion    PT Next Visit Plan Continued gait training  with [employer].Balance activities, LE strengthening    Consulted and Agree with Plan of Care Patient;Family member/caregiver    Family Member Consulted Wife           Patient will benefit from skilled therapeutic intervention in order to improve the following deficits and impairments:  Abnormal gait, Decreased coordination, Difficulty walking, Impaired tone, Decreased safety awareness, Decreased endurance, Decreased activity tolerance, Pain, Decreased balance, Decreased knowledge of use of DME, Postural dysfunction, Impaired sensation, Decreased strength, Decreased mobility  Visit Diagnosis: Hemiplegia and hemiparesis following cerebral infarction affecting left dominant side (HCC)  Unsteadiness on feet  Muscle weakness (generalized)  Other lack of coordination  Other abnormalities of gait and mobility     Problem List Patient Active Problem List   Diagnosis Date Noted  . Abnormality of gait 09/24/2019  . AKI (acute kidney injury) ([place])   . Fever   . Sepsis without acute organ dysfunction ([place])   . Elevated BUN   . Blood pressure increase diastolic   . Labile blood pressure   . Leukopenia   . Benign essential HTN   . Hypoalbuminemia   . Cerebrovascular accident (CVA) of right basal ganglia ([place]) 07/07/2019  . Acute ischemic stroke ([place])   . Dyslipidemia   . Dysphagia, post-stroke   . Polycythemia   . Stroke ([place]) 06/30/2019  . Prediabetes 12/23/2016  . Elevated LFTs 12/23/2016  . Pure hypercholesterolemia 12/21/2016  . Alcoholism ([place]) 12/21/2016  . Eczema 09/18/2013  . Essential hypertension 09/18/2013  . Gastroesophageal reflux disease without esophagitis 09/18/2013  . Tobacco abuse 09/18/2013    Jones Bales, PT, DPT 12/04/2019, 10:15 AM  [facility]  [facility] [address] [place], [name], [address] Phone: [phone]   Fax:  [phone]  Name: Russell Russell MRN: [id number] Date of Birth:  1951-03-11

## 2019-12-04 NOTE — Therapy (Signed)
[facility] [address] [place], [name], [address] Phone: [phone]   Fax:  [phone]  Occupational Therapy Treatment  Patient Details  Name: Russell Russell MRN: [id number] Date of Birth: 1950/08/17 Referring Provider (OT): [name]   Encounter Date: 12/04/2019   OT End of Session - 12/04/19 0926    Visit Number 19    Number of Visits 25    Date for OT Re-Evaluation 01/30/20    Authorization Type BCBS? "Follow Medicare Guidelines"    Authorization - Number of Visits 19    Progress Note Due on Visit 28    OT Start Time 0850    OT Stop Time 0928    OT Time Calculation (min) 38 min    Activity Tolerance Patient tolerated treatment well    Behavior During Therapy [facility] for tasks assessed/performed           Past Medical History:  Diagnosis Date  . Alcohol abuse    drinks 1 gallon of Gin every weekend, nothing during the week x >15 yrs  . Elevated transaminase level    +elev bili: abd u/s 06/2014 showed stable small hepatic hemangiomas, o/w normal.  . Essential hypertension   . GERD (gastroesophageal reflux disease)   . Hyperlipidemia 03/2014   Atorv started-chol improved  . Impaired fasting glucose 03/2014  . Nephrolithiasis   . PUD (peptic ulcer disease)   . Stroke ([place])   . Tobacco dependence    chantix: "psych effects"    Past Surgical History:  Procedure Laterality Date  . COLONOSCOPY  2005   Recall 10 yrs ([place])  . LOOP RECORDER INSERTION N/A 07/03/2019   Procedure: LOOP RECORDER INSERTION;  Surgeon: Thompson Grayer, MD;  Location: [facility] [facility];  Service: Cardiovascular;  Laterality: N/A;  . removal of kidney stones  2006   cystoscopic--removed from ureter.  No prob since.    There were no vitals filed for this visit.   Subjective Assessment - 12/04/19 0914    Subjective  No falls reports. Patient denies any pain today.    Patient is accompanied by: Family member    Pertinent  History HTN, ETOH use, smoker, loop recorder    Currently in Pain? No/denies                        OT Treatments/Exercises (OP) - 12/04/19 2751      Transfers   Sit to Stand 5: Supervision    Stand to Sit 5: Supervision      ADLs   Functional Mobility functional mobility while walking with RW around therapy gym turning head to locate cones, side stepping at counter, dynamic turning and reaching with resistance clothespins.       Exercises   Exercises Wrist      Wrist Exercises   Wrist Extension Self ROM;Both;5 reps   prayer stretch   Other wrist exercises supinator/pronation wheel      Neurological Re-education Exercises   Weight Bearing Position --      Functional Reaching Activities   [place] reaching overhead in cabinet to put cones away approx 110 degrees shoulder flexion with LUE                  OT Education - 12/04/19 0930    Education Details encouraged patient to go IN the grocery store with spouse to work towards goal    Person(s) Educated Patient;Spouse    Methods Explanation  Comprehension Need further instruction;Verbalized understanding            OT Short Term Goals - 12/01/19 1050      OT SHORT TERM GOAL #1   Title Patient will compelte a home exercise program designed to improve functional use of LUE with mod cueing and set up due 09/05/19    Time 4    Period Weeks    Status Achieved    Target Date 09/05/19      OT SHORT TERM GOAL #2   Title Patient will don a front opening shirt with fasteners (buttons/zipper) with mod assist    Time 4    Period Weeks    Status Achieved      OT SHORT TERM GOAL #3   Title Patient will demonstrate ability to cut food on plate with min assist    Time 4    Period Weeks    Status Achieved      OT SHORT TERM GOAL #4   Title Patient will demonstrate low reach, grasp, release of lightweight small object (less than 1 lb) with min assist    Time 4    Period Weeks    Status Achieved       OT SHORT TERM GOAL #5   Title Patient will ambulate into bathroom and cmplete toileting with supervision and verbal cueing assistance due 11/04/19    Time 4    Period Weeks    Status Achieved      OT SHORT TERM GOAL #6   Title Patient will transport necessary items for bathing and dressing into bathroom to encourage greater independence with showering.    Status Achieved             OT Long Term Goals - 12/01/19 1051      OT LONG TERM GOAL #1   Title Patient will complete an updated HEP to address LUE functioning due 10/05/19    Status Achieved      OT LONG TERM GOAL #2   Title Patient will walk with assistive device into bathroom to use toilet with min assist    Status Achieved      OT LONG TERM GOAL #3   Title Patient will transfer onto shower seat with min assist    Status Achieved      OT LONG TERM GOAL #4   Title Patient will obtain and release a 2-3 lb object at chest height shelf using LUE and min assist    Status Achieved      OT LONG TERM GOAL #5   Title Patient will toilet himself with modified independence due 12/04/19    Time 8    Period Weeks    Status Achieved      Long Term Additional Goals   Additional Long Term Goals Yes      OT LONG TERM GOAL #6   Title Patient will shower self with supervision/set up assistance    Time 8    Period Weeks    Status Achieved      OT LONG TERM GOAL #7   Title Patient will grocery shop with wife, and will locate and retrieve at least 5 items from list with only minimal assistance due 01/30/20    Time 8    Period Weeks    Status On-going    Target Date 01/30/20      OT LONG TERM GOAL #8   Title Patient will demonstrate at least 60* of active wrist extension without  report of wrist pain (left)    Time 4    Period Weeks    Status New                 Plan - 12/04/19 1132    Clinical Impression Statement Patient demonstrated good overhead reach with LUE and improving with functional mobility with head  turns. Patient with improved dynamic balance.    OT Occupational Profile and History Detailed Assessment- Review of Records and additional review of physical, cognitive, psychosocial history related to current functional performance    Occupational performance deficits (Please refer to evaluation for details): ADL's;IADL's;Rest and Sleep;Leisure    Body Structure / Function / Physical Skills ADL;Coordination;Endurance;GMC;UE functional use;Sensation;Decreased knowledge of precautions;Balance;Body mechanics;Decreased knowledge of use of DME;Flexibility;IADL;Pain;Vision;Cardiopulmonary status limiting activity;Dexterity;FMC;Proprioception;Strength;Tone;ROM    Cognitive Skills Attention;Orientation;Thought;Perception;Problem Solve;Safety Awareness;Sequencing;Memory;Learn    Psychosocial Skills Habits;Routines and Behaviors    Rehab Potential Good    Clinical Decision Making Several treatment options, min-mod task modification necessary    Comorbidities Affecting Occupational Performance: May have comorbidities impacting occupational performance    Modification or Assistance to Complete Evaluation  Min-Moderate modification of tasks or assist with assess necessary to complete eval    OT Frequency 2x / week    OT Duration 8 weeks    OT Treatment/Interventions Self-care/ADL training;Electrical Stimulation;Therapeutic exercise;Visual/perceptual remediation/compensation;Patient/family education;Splinting;Neuromuscular education;Moist Heat;Aquatic Therapy;Balance training;Therapeutic activities;Functional Mobility Training;Fluidtherapy;DME and/or AE instruction;Manual Therapy;Cognitive remediation/compensation    Plan wrist exercises for increasing wrist extension, dynamic balance, functional mobility    OT Home Exercise Plan coordination HEP    Consulted and Agree with Plan of Care Patient;Family member/caregiver    Family Member Consulted Wife, [name]           Patient will benefit from skilled  therapeutic intervention in order to improve the following deficits and impairments:   Body Structure / Function / Physical Skills: ADL, Coordination, Endurance, GMC, UE functional use, Sensation, Decreased knowledge of precautions, Balance, Body mechanics, Decreased knowledge of use of DME, Flexibility, IADL, Pain, Vision, Cardiopulmonary status limiting activity, Dexterity, FMC, Proprioception, Strength, Tone, ROM Cognitive Skills: Attention, Orientation, Thought, Perception, Problem Solve, Safety Awareness, Sequencing, Memory, Learn Psychosocial Skills: Habits, Routines and Behaviors   Visit Diagnosis: Hemiplegia and hemiparesis following cerebral infarction affecting left dominant side (HCC)  Unsteadiness on feet  Muscle weakness (generalized)  Other lack of coordination    Problem List Patient Active Problem List   Diagnosis Date Noted  . Abnormality of gait 09/24/2019  . AKI (acute kidney injury) ([place])   . Fever   . Sepsis without acute organ dysfunction ([place])   . Elevated BUN   . Blood pressure increase diastolic   . Labile blood pressure   . Leukopenia   . Benign essential HTN   . Hypoalbuminemia   . Cerebrovascular accident (CVA) of right basal ganglia ([place]) 07/07/2019  . Acute ischemic stroke ([place])   . Dyslipidemia   . Dysphagia, post-stroke   . Polycythemia   . Stroke ([place]) 06/30/2019  . Prediabetes 12/23/2016  . Elevated LFTs 12/23/2016  . Pure hypercholesterolemia 12/21/2016  . Alcoholism ([place]) 12/21/2016  . Eczema 09/18/2013  . Essential hypertension 09/18/2013  . Gastroesophageal reflux disease without esophagitis 09/18/2013  . Tobacco abuse 09/18/2013    Zachery Conch MOT, OTR/L  12/04/2019, 11:59 AM  [facility] [address] [place], [name], [address] Phone: [phone]   Fax:  [phone]  Name: Russell Russell MRN: [id number] Date of Birth: October 16, 1950

## 2019-12-08 ENCOUNTER — Encounter: Payer: Self-pay | Admitting: Occupational Therapy

## 2019-12-08 ENCOUNTER — Ambulatory Visit: Payer: Medicare Other | Admitting: Physical Therapy

## 2019-12-08 ENCOUNTER — Other Ambulatory Visit: Payer: Self-pay

## 2019-12-08 ENCOUNTER — Encounter: Payer: Self-pay | Admitting: Physical Therapy

## 2019-12-08 ENCOUNTER — Ambulatory Visit: Payer: Medicare Other | Admitting: Occupational Therapy

## 2019-12-08 DIAGNOSIS — R2681 Unsteadiness on feet: Secondary | ICD-10-CM

## 2019-12-08 DIAGNOSIS — R41840 Attention and concentration deficit: Secondary | ICD-10-CM

## 2019-12-08 DIAGNOSIS — R482 Apraxia: Secondary | ICD-10-CM

## 2019-12-08 DIAGNOSIS — R208 Other disturbances of skin sensation: Secondary | ICD-10-CM

## 2019-12-08 DIAGNOSIS — M6281 Muscle weakness (generalized): Secondary | ICD-10-CM

## 2019-12-08 DIAGNOSIS — I69352 Hemiplegia and hemiparesis following cerebral infarction affecting left dominant side: Secondary | ICD-10-CM

## 2019-12-08 DIAGNOSIS — R2689 Other abnormalities of gait and mobility: Secondary | ICD-10-CM

## 2019-12-08 DIAGNOSIS — R278 Other lack of coordination: Secondary | ICD-10-CM

## 2019-12-08 NOTE — Therapy (Signed)
[facility] [address] [place], [name], [address] Phone: [phone]   Fax:  [phone]  Occupational Therapy Treatment  Patient Details  Name: Russell Russell MRN: [id number] Date of Birth: 10/14/1950 Referring Provider (OT): [name]   Encounter Date: 12/08/2019   OT End of Session - 12/08/19 1557    Visit Number 20    Number of Visits 25    Date for OT Re-Evaluation 01/30/20    Authorization Type BCBS? "Follow Medicare Guidelines"    Authorization - Number of Visits 20    Progress Note Due on Visit 28    OT Start Time 1445    OT Stop Time 1529    OT Time Calculation (min) 44 min    Activity Tolerance Patient tolerated treatment well    Behavior During Therapy [facility] for tasks assessed/performed           Past Medical History:  Diagnosis Date  . Alcohol abuse    drinks 1 gallon of Gin every weekend, nothing during the week x >15 yrs  . Elevated transaminase level    +elev bili: abd u/s 06/2014 showed stable small hepatic hemangiomas, o/w normal.  . Essential hypertension   . GERD (gastroesophageal reflux disease)   . Hyperlipidemia 03/2014   Atorv started-chol improved  . Impaired fasting glucose 03/2014  . Nephrolithiasis   . PUD (peptic ulcer disease)   . Stroke ([place])   . Tobacco dependence    chantix: "psych effects"    Past Surgical History:  Procedure Laterality Date  . COLONOSCOPY  2005   Recall 10 yrs ([place])  . LOOP RECORDER INSERTION N/A 07/03/2019   Procedure: LOOP RECORDER INSERTION;  Surgeon: Thompson Grayer, MD;  Location: [facility];  Service: Cardiovascular;  Laterality: N/A;  . removal of kidney stones  2006   cystoscopic--removed from ureter.  No prob since.    There were no vitals filed for this visit.   Subjective Assessment - 12/08/19 1547    Subjective  Patient reports that things are going well at home.    Patient is accompanied by: Family member     Pertinent History HTN, ETOH use, smoker, loop recorder    Currently in Pain? No/denies    Pain Score 0-No pain                        OT Treatments/Exercises (OP) - 12/08/19 1553      Neurological Re-education Exercises   Other Exercises 1 Patient with poor postural control in standing, heavilly dependent on visual feedback when attempting to move feet, and also reliant on UE for support.  Worked on reaching overhead to increase left high reach and improve thoracic and hip extension.  Then worked on weight shifting onto RLE to step with LLE.  Worked on sidestepping left/right with min cueing and facilitation.                      OT Short Term Goals - 12/08/19 1600      OT SHORT TERM GOAL #1   Title Patient will compelte a home exercise program designed to improve functional use of LUE with mod cueing and set up due 09/05/19    Time 4    Period Weeks    Status Achieved    Target Date 09/05/19      OT SHORT TERM GOAL #2   Title Patient will don a front opening  shirt with fasteners (buttons/zipper) with mod assist    Time 4    Period Weeks    Status Achieved      OT SHORT TERM GOAL #3   Title Patient will demonstrate ability to cut food on plate with min assist    Time 4    Period Weeks    Status Achieved      OT SHORT TERM GOAL #4   Title Patient will demonstrate low reach, grasp, release of lightweight small object (less than 1 lb) with min assist    Time 4    Period Weeks    Status Achieved      OT SHORT TERM GOAL #5   Title Patient will ambulate into bathroom and cmplete toileting with supervision and verbal cueing assistance due 11/04/19    Time 4    Period Weeks    Status Achieved      OT SHORT TERM GOAL #6   Title Patient will transport necessary items for bathing and dressing into bathroom to encourage greater independence with showering.    Status Achieved             OT Long Term Goals - 12/08/19 1600      OT LONG TERM GOAL #1    Title Patient will complete an updated HEP to address LUE functioning due 10/05/19    Status Achieved      OT LONG TERM GOAL #2   Title Patient will walk with assistive device into bathroom to use toilet with min assist    Status Achieved      OT LONG TERM GOAL #3   Title Patient will transfer onto shower seat with min assist    Status Achieved      OT LONG TERM GOAL #4   Title Patient will obtain and release a 2-3 lb object at chest height shelf using LUE and min assist    Status Achieved      OT LONG TERM GOAL #5   Title Patient will toilet himself with modified independence due 12/04/19    Time 8    Period Weeks    Status Achieved      OT LONG TERM GOAL #6   Title Patient will shower self with supervision/set up assistance    Time 8    Period Weeks    Status Achieved      OT LONG TERM GOAL #7   Title Patient will grocery shop with wife, and will locate and retrieve at least 5 items from list with only minimal assistance due 01/30/20    Time 8    Period Weeks    Status On-going      OT LONG TERM GOAL #8   Title Patient will demonstrate at least 60* of active wrist extension without report of wrist pain (left)    Time 4    Period Weeks    Status New                 Plan - 12/08/19 1558    Clinical Impression Statement Patient still challenged by functional mobility involving turning, changing directions, or backing up - although he and wife both report improved functional performance in home environment    OT Occupational Profile and History Detailed Assessment- Review of Records and additional review of physical, cognitive, psychosocial history related to current functional performance    Occupational performance deficits (Please refer to evaluation for details): ADL's;IADL's;Rest and Sleep;Leisure    Body Structure / Function /  Physical Skills ADL;Coordination;Endurance;GMC;UE functional use;Sensation;Decreased knowledge of precautions;Balance;Body  mechanics;Decreased knowledge of use of DME;Flexibility;IADL;Pain;Vision;Cardiopulmonary status limiting activity;Dexterity;FMC;Proprioception;Strength;Tone;ROM    Cognitive Skills Attention;Orientation;Thought;Perception;Problem Solve;Safety Awareness;Sequencing;Memory;Learn    Psychosocial Skills Habits;Routines and Behaviors    Rehab Potential Good    Clinical Decision Making Several treatment options, min-mod task modification necessary    Comorbidities Affecting Occupational Performance: May have comorbidities impacting occupational performance    Modification or Assistance to Complete Evaluation  Min-Moderate modification of tasks or assist with assess necessary to complete eval    OT Frequency 2x / week    OT Duration 8 weeks    OT Treatment/Interventions Self-care/ADL training;Electrical Stimulation;Therapeutic exercise;Visual/perceptual remediation/compensation;Patient/family education;Splinting;Neuromuscular education;Moist Heat;Aquatic Therapy;Balance training;Therapeutic activities;Functional Mobility Training;Fluidtherapy;DME and/or AE instruction;Manual Therapy;Cognitive remediation/compensation    Plan wrist exercises for increasing wrist extension, dynamic balance, functional mobility    OT Home Exercise Plan coordination HEP    Consulted and Agree with Plan of Care Patient;Family member/caregiver    Family Member Consulted Wife, [name]           Patient will benefit from skilled therapeutic intervention in order to improve the following deficits and impairments:   Body Structure / Function / Physical Skills: ADL, Coordination, Endurance, GMC, UE functional use, Sensation, Decreased knowledge of precautions, Balance, Body mechanics, Decreased knowledge of use of DME, Flexibility, IADL, Pain, Vision, Cardiopulmonary status limiting activity, Dexterity, FMC, Proprioception, Strength, Tone, ROM Cognitive Skills: Attention, Orientation, Thought, Perception, Problem Solve, Safety  Awareness, Sequencing, Memory, Learn Psychosocial Skills: Habits, Routines and Behaviors   Visit Diagnosis: Hemiplegia and hemiparesis following cerebral infarction affecting left dominant side (HCC)  Unsteadiness on feet  Muscle weakness (generalized)  Other lack of coordination  Apraxia  Attention and concentration deficit  Other disturbances of skin sensation    Problem List Patient Active Problem List   Diagnosis Date Noted  . Abnormality of gait 09/24/2019  . AKI (acute kidney injury) ([place])   . Fever   . Sepsis without acute organ dysfunction ([place])   . Elevated BUN   . Blood pressure increase diastolic   . Labile blood pressure   . Leukopenia   . Benign essential HTN   . Hypoalbuminemia   . Cerebrovascular accident (CVA) of right basal ganglia ([place]) 07/07/2019  . Acute ischemic stroke ([place])   . Dyslipidemia   . Dysphagia, post-stroke   . Polycythemia   . Stroke ([place]) 06/30/2019  . Prediabetes 12/23/2016  . Elevated LFTs 12/23/2016  . Pure hypercholesterolemia 12/21/2016  . Alcoholism ([place]) 12/21/2016  . Eczema 09/18/2013  . Essential hypertension 09/18/2013  . Gastroesophageal reflux disease without esophagitis 09/18/2013  . Tobacco abuse 09/18/2013    Mariah Milling, OTR/L 12/08/2019, 4:00 PM  [facility] [address] [place], [name], [address] Phone: [phone]   Fax:  [phone]  Name: Russell Russell MRN: [id number] Date of Birth: 03/08/1951

## 2019-12-09 NOTE — Therapy (Signed)
[facility] [address] [place], [name], [address] Phone: [phone]   Fax:  [phone]  Physical Therapy Treatment  Patient Details  Name: Russell Russell MRN: [id number] Date of Birth: Feb 24, 1951 Referring Provider (PT): Lauraine Rinne, PA-C   Encounter Date: 12/08/2019   PT End of Session - 12/08/19 1407    Visit Number 32    Number of Visits 46    Date for PT Re-Evaluation 03/02/20   new POC for 8 weeks, Cert for 90 days   Authorization Type BCBS (follow medicare guidelines, 10th visit PN)    Progress Note Due on Visit 40    PT Start Time 1403    PT Stop Time 1445    PT Time Calculation (min) 42 min    Equipment Utilized During Treatment Gait belt    Activity Tolerance Patient tolerated treatment well    Behavior During Therapy WFL for tasks assessed/performed           Past Medical History:  Diagnosis Date  . Alcohol abuse    drinks 1 gallon of Gin every weekend, nothing during the week x >15 yrs  . Elevated transaminase level    +elev bili: abd u/s 06/2014 showed stable small hepatic hemangiomas, o/w normal.  . Essential hypertension   . GERD (gastroesophageal reflux disease)   . Hyperlipidemia 03/2014   Atorv started-chol improved  . Impaired fasting glucose 03/2014  . Nephrolithiasis   . PUD (peptic ulcer disease)   . Stroke ([place])   . Tobacco dependence    chantix: "psych effects"    Past Surgical History:  Procedure Laterality Date  . COLONOSCOPY  2005   Recall 10 yrs ([place])  . LOOP RECORDER INSERTION N/A 07/03/2019   Procedure: LOOP RECORDER INSERTION;  Surgeon: Thompson Grayer, MD;  Location: [facility];  Service: Cardiovascular;  Laterality: N/A;  . removal of kidney stones  2006   cystoscopic--removed from ureter.  No prob since.    There were no vitals filed for this visit.   Subjective Assessment - 12/08/19 1407    Subjective No new complatints. No falls or pain to  report.    Patient is accompained by: Family member   spouse   Pertinent History PMH: HTN, alcohol abuse (ongoing use), HLD, GERD, PUD, tobacco use    Limitations Standing;Walking;House hold activities    Patient Stated Goals return to prior level of function, walk, "wheelchair is not apart of me" (wants to get out of the w/c)    Currently in Pain? No/denies    Pain Score 0-No pain                   [facility] Adult PT Treatment/Exercise - 12/08/19 1408      Transfers   Transfers Sit to Stand;Stand to Sit    Sit to Stand 5: Supervision    Stand to Sit 5: Supervision      Ambulation/Gait   Ambulation/Gait Yes    Ambulation/Gait Assistance 4: Min assist;3: Mod assist    Ambulation/Gait Assistance Details increased assistance needed with use of cane with cues/facilitation for weight shifting onto right LE in stance, for step length, sequencing and cane placement with gait.     Ambulation Distance (Feet) 70 Feet   x2   Assistive device Small based quad cane    Gait Pattern Step-through pattern;Step-to pattern;Decreased stride length;Decreased stance time - right;Decreased step length - left    Ambulation Surface Level;Indoor  High Level Balance   High Level Balance Activities Side stepping;Backward walking;Marching forwards    High Level Balance Comments in parallel bars: 3 laps each with cues needed on form, weight shifting and posture. min guard to min assist for balance.       Neuro Re-ed    Neuro Re-ed Details  for balance/muscle re-ed: in parallel bars with bil UE support: alternating fwd stepping over black beam for 10 reps each side with cues/emphasis on increased step length/height, left>right LE. Min guard to min assist for balance.                 PT Short Term Goals - 12/03/19 1501      PT SHORT TERM GOAL #1   Title Patient will improve TUG to </= 30 seconds with LRAD to demo reduced fall risk (All STGs Due: 12/31/19)    Baseline 38.87 secs    Time 4     Period Weeks    Status New    Target Date 12/31/19      PT SHORT TERM GOAL #2   Title Patient will improve Berg Balance to >/= 35/56 to demonstrate improved balance and reduced fall risk    Baseline 31/56    Time 4    Period Weeks    Status New      PT SHORT TERM GOAL #3   Title Patient will improve gait speed to >/= 1.5 ft/sec with LRAD to demo improved mobility    Baseline 1.13 ft/sec    Time 4    Period Weeks    Status New             PT Long Term Goals - 12/01/19 1517      PT LONG TERM GOAL #1   Title Patient will be independent with walking program 5x/day week with wife assistance (ALL LTG Due:01/28/2020)    Baseline TBD    Time 8    Period Weeks    Status New    Target Date 01/28/20      PT LONG TERM GOAL #2   Title Patient will demonstrate ability to complete all transfers w/ LRAD at Mod I Level to demonstrate improved independence with functional mobility    Baseline supervision with cues for safety at times, improved from min guard assist    Time --    Period --    Status Revised      PT LONG TERM GOAL #3   Title Patient will demo ability to ambulate >400 ft on outdoor/unlevel surfaces with CGA to demo improved community mobility    Baseline unable to assess today due to weather    Time 8    Period Weeks    Status Revised      PT LONG TERM GOAL #4   Title Patient will improve TUG to </= 25 seconds to demonstrate improved mobility and reduced fall risk    Baseline 38.87 secs    Time 8    Period Weeks    Status New      PT LONG TERM GOAL #5   Title Patient will improve Berg Balance to >/= 40/56 to demonstrate reduced risk for falls    Baseline 31/56    Time 8    Period Weeks    Status New      Additional Long Term Goals   Additional Long Term Goals Yes      PT LONG TERM GOAL #6   Title patient will improve gait speed  to >/= 2.0 ft/sec to demonstrate improved community mobility    Baseline 1.13 ft/sec    Time 8    Period Weeks    Status New                   Plan - 12/08/19 1408    Clinical Impression Statement Today's skilled session continued to work on gait with small based quad cane and balance reactions with min/mod assist needed. Pt continues to need cues on sequencing with cane. Pt also needed cues for posture and for larger steps/movements with balance activities. The pt should benefit from continued PT to progress toward unmet goals.    Personal Factors and Comorbidities Comorbidity 3+    Comorbidities HTN, alcohol abuse (ongoing use), HLD, GERD, PUD, tobacco use    Examination-Activity Limitations Bed Mobility;Dressing;Locomotion Level;Stairs;Stand;Toileting;Transfers;Bathing    Examination-Participation Restrictions Driving;Interpersonal Relationship;Yard Work    [employer] Evolving/Moderate complexity    Rehab Potential Good    PT Frequency 2x / week    PT Duration 8 weeks    PT Treatment/Interventions ADLs/Self Care Home Management;Electrical Stimulation;DME Instruction;Moist Heat;Cryotherapy;Gait training;Stair training;Functional mobility training;Therapeutic activities;Therapeutic exercise;Balance training;Neuromuscular re-education;Patient/family education;Orthotic Fit/Training;Wheelchair mobility training;Passive range of motion    PT Next Visit Plan Continued gait training with [facility].Balance activities, LE strengthening    Consulted and Agree with Plan of Care Patient;Family member/caregiver    Family Member Consulted Wife           Patient will benefit from skilled therapeutic intervention in order to improve the following deficits and impairments:  Abnormal gait, Decreased coordination, Difficulty walking, Impaired tone, Decreased safety awareness, Decreased endurance, Decreased activity tolerance, Pain, Decreased balance, Decreased knowledge of use of DME, Postural dysfunction, Impaired sensation, Decreased strength, Decreased mobility  Visit Diagnosis: Hemiplegia and  hemiparesis following cerebral infarction affecting left dominant side (HCC)  Unsteadiness on feet  Muscle weakness (generalized)  Other abnormalities of gait and mobility     Problem List Patient Active Problem List   Diagnosis Date Noted  . Abnormality of gait 09/24/2019  . AKI (acute kidney injury) ([facility])   . Fever   . Sepsis without acute organ dysfunction ([facility])   . Elevated BUN   . Blood pressure increase diastolic   . Labile blood pressure   . Leukopenia   . Benign essential HTN   . Hypoalbuminemia   . Cerebrovascular accident (CVA) of right basal ganglia ([facility]) 07/07/2019  . Acute ischemic stroke ([facility])   . Dyslipidemia   . Dysphagia, post-stroke   . Polycythemia   . Stroke ([place]) 06/30/2019  . Prediabetes 12/23/2016  . Elevated LFTs 12/23/2016  . Pure hypercholesterolemia 12/21/2016  . Alcoholism ([place]) 12/21/2016  . Eczema 09/18/2013  . Essential hypertension 09/18/2013  . Gastroesophageal reflux disease without esophagitis 09/18/2013  . Tobacco abuse 09/18/2013    [name], PTA, [facility] [address], [place], [place] [address] [phone] 12/09/19, 7:58 PM   Name: Russell Russell MRN: [id number] Date of Birth: 1950/11/09

## 2019-12-10 ENCOUNTER — Other Ambulatory Visit: Payer: Self-pay

## 2019-12-10 ENCOUNTER — Encounter: Payer: Self-pay | Admitting: Physical Therapy

## 2019-12-10 ENCOUNTER — Ambulatory Visit: Payer: Medicare Other | Admitting: Physical Therapy

## 2019-12-10 ENCOUNTER — Ambulatory Visit: Payer: Medicare Other | Admitting: Occupational Therapy

## 2019-12-10 ENCOUNTER — Encounter: Payer: Self-pay | Admitting: Occupational Therapy

## 2019-12-10 DIAGNOSIS — R2681 Unsteadiness on feet: Secondary | ICD-10-CM

## 2019-12-10 DIAGNOSIS — R208 Other disturbances of skin sensation: Secondary | ICD-10-CM

## 2019-12-10 DIAGNOSIS — R278 Other lack of coordination: Secondary | ICD-10-CM

## 2019-12-10 DIAGNOSIS — R41840 Attention and concentration deficit: Secondary | ICD-10-CM

## 2019-12-10 DIAGNOSIS — I69352 Hemiplegia and hemiparesis following cerebral infarction affecting left dominant side: Secondary | ICD-10-CM | POA: Diagnosis not present

## 2019-12-10 DIAGNOSIS — M6281 Muscle weakness (generalized): Secondary | ICD-10-CM

## 2019-12-10 DIAGNOSIS — R482 Apraxia: Secondary | ICD-10-CM

## 2019-12-10 DIAGNOSIS — R2689 Other abnormalities of gait and mobility: Secondary | ICD-10-CM

## 2019-12-10 NOTE — Therapy (Signed)
[facility] [address] [place], [name], [address] Phone: [phone]   Fax:  [phone]  Occupational Therapy Treatment  Patient Details  Name: Russell Russell MRN: [id number] Date of Birth: Feb 04, 1951 Referring Provider (OT): [name]   Encounter Date: 12/10/2019   OT End of Session - 12/10/19 1551    Visit Number 21    Number of Visits 25    Date for OT Re-Evaluation 01/30/20    Authorization Type BCBS? "Follow Medicare Guidelines"    Authorization - Number of Visits 21    Progress Note Due on Visit 28    OT Start Time 1400    OT Stop Time 1444    OT Time Calculation (min) 44 min    Activity Tolerance Patient tolerated treatment well    Behavior During Therapy [facility] for tasks assessed/performed           Past Medical History:  Diagnosis Date   Alcohol abuse    drinks 1 gallon of Gin every weekend, nothing during the week x >15 yrs   Elevated transaminase level    +elev bili: abd u/s 06/2014 showed stable small hepatic hemangiomas, o/w normal.   Essential hypertension    GERD (gastroesophageal reflux disease)    Hyperlipidemia 03/2014   Atorv started-chol improved   Impaired fasting glucose 03/2014   Nephrolithiasis    PUD (peptic ulcer disease)    Stroke ([place])    Tobacco dependence    chantix: "psych effects"    Past Surgical History:  Procedure Laterality Date   COLONOSCOPY  2005   Recall 10 yrs ([place])   LOOP RECORDER INSERTION N/A 07/03/2019   Procedure: LOOP RECORDER INSERTION;  Surgeon: Thompson Grayer, MD;  Location: [facility];  Service: Cardiovascular;  Laterality: N/A;   removal of kidney stones  2006   cystoscopic--removed from ureter.  No prob since.    There were no vitals filed for this visit.   Subjective Assessment - 12/10/19 1401    Subjective  Patient reports things are great at home    Patient is accompanied by: Family member    Pertinent History  HTN, ETOH use, smoker, loop recorder    Currently in Pain? No/denies    Pain Score 0-No pain                        OT Treatments/Exercises (OP) - 12/10/19 1545      Neurological Re-education Exercises   Other Exercises 1 Neuromuscular reeducation to address postural control in sitting and standing.  Patient with posterior pelvic tilt and thoracic flexion with attemots to stand, then standing with posterior bias.  Overt cues to raise head and keep back straight and move toward edge of seat and stand with hands on knees and feet equidistance apart.  Patient able to stand without assistance. In standing, patient challenge to shift weight off one leg to step it forward or laterally.  Worked on upright control -increased hip and knee extension - patient tends to soften in to Left knee/hip flexion.  With improved activation, and facilitation to maintain alignment patient able to shift feet.  Worked on coordiantion and control of whole left side for overhead reach.                      OT Short Term Goals - 12/10/19 1552      OT SHORT TERM GOAL #1   Title Patient will  compelte a home exercise program designed to improve functional use of LUE with mod cueing and set up due 09/05/19    Time 4    Period Weeks    Status Achieved    Target Date 09/05/19      OT SHORT TERM GOAL #2   Title Patient will don a front opening shirt with fasteners (buttons/zipper) with mod assist    Time 4    Period Weeks    Status Achieved      OT SHORT TERM GOAL #3   Title Patient will demonstrate ability to cut food on plate with min assist    Time 4    Period Weeks    Status Achieved      OT SHORT TERM GOAL #4   Title Patient will demonstrate low reach, grasp, release of lightweight small object (less than 1 lb) with min assist    Time 4    Period Weeks    Status Achieved      OT SHORT TERM GOAL #5   Title Patient will ambulate into bathroom and cmplete toileting with supervision and  verbal cueing assistance due 11/04/19    Time 4    Period Weeks    Status Achieved      OT SHORT TERM GOAL #6   Title Patient will transport necessary items for bathing and dressing into bathroom to encourage greater independence with showering.    Status Achieved             OT Long Term Goals - 12/10/19 1552      OT LONG TERM GOAL #1   Title Patient will complete an updated HEP to address LUE functioning due 10/05/19    Status Achieved      OT LONG TERM GOAL #2   Title Patient will walk with assistive device into bathroom to use toilet with min assist    Status Achieved      OT LONG TERM GOAL #3   Title Patient will transfer onto shower seat with min assist    Status Achieved      OT LONG TERM GOAL #4   Title Patient will obtain and release a 2-3 lb object at chest height shelf using LUE and min assist    Status Achieved      OT LONG TERM GOAL #5   Title Patient will toilet himself with modified independence due 12/04/19    Time 8    Period Weeks    Status Achieved      OT LONG TERM GOAL #6   Title Patient will shower self with supervision/set up assistance    Time 8    Period Weeks    Status Achieved      OT LONG TERM GOAL #7   Title Patient will grocery shop with wife, and will locate and retrieve at least 5 items from list with only minimal assistance due 01/30/20    Time 8    Period Weeks    Status On-going      OT LONG TERM GOAL #8   Title Patient will demonstrate at least 60* of active wrist extension without report of wrist pain (left)    Time 4    Period Weeks    Status New                 Plan - 12/10/19 1552    Clinical Impression Statement Patient still challenged by functional mobility involving turning, changing directions, or backing up, although  showing improvement with min facilitation and cueing    OT Occupational Profile and History Detailed Assessment- Review of Records and additional review of physical, cognitive, psychosocial  history related to current functional performance    Occupational performance deficits (Please refer to evaluation for details): ADL's;IADL's;Rest and Sleep;Leisure    Body Structure / Function / Physical Skills ADL;Coordination;Endurance;GMC;UE functional use;Sensation;Decreased knowledge of precautions;Balance;Body mechanics;Decreased knowledge of use of DME;Flexibility;IADL;Pain;Vision;Cardiopulmonary status limiting activity;Dexterity;FMC;Proprioception;Strength;Tone;ROM    Cognitive Skills Attention;Orientation;Thought;Perception;Problem Solve;Safety Awareness;Sequencing;Memory;Learn    Psychosocial Skills Habits;Routines and Behaviors    Rehab Potential Good    Clinical Decision Making Several treatment options, min-mod task modification necessary    Comorbidities Affecting Occupational Performance: May have comorbidities impacting occupational performance    Modification or Assistance to Complete Evaluation  Min-Moderate modification of tasks or assist with assess necessary to complete eval    OT Frequency 2x / week    OT Duration 8 weeks    OT Treatment/Interventions Self-care/ADL training;Electrical Stimulation;Therapeutic exercise;Visual/perceptual remediation/compensation;Patient/family education;Splinting;Neuromuscular education;Moist Heat;Aquatic Therapy;Balance training;Therapeutic activities;Functional Mobility Training;Fluidtherapy;DME and/or AE instruction;Manual Therapy;Cognitive remediation/compensation    Plan wrist exercises for increasing wrist extension, dynamic balance, functional mobility    OT Home Exercise Plan coordination HEP    Consulted and Agree with Plan of Care Patient;Family member/caregiver    Family Member Consulted Wife, [name]           Patient will benefit from skilled therapeutic intervention in order to improve the following deficits and impairments:   Body Structure / Function / Physical Skills: ADL, Coordination, Endurance, GMC, UE functional use,  Sensation, Decreased knowledge of precautions, Balance, Body mechanics, Decreased knowledge of use of DME, Flexibility, IADL, Pain, Vision, Cardiopulmonary status limiting activity, Dexterity, FMC, Proprioception, Strength, Tone, ROM Cognitive Skills: Attention, Orientation, Thought, Perception, Problem Solve, Safety Awareness, Sequencing, Memory, Learn Psychosocial Skills: Habits, Routines and Behaviors   Visit Diagnosis: Hemiplegia and hemiparesis following cerebral infarction affecting left dominant side (HCC)  Unsteadiness on feet  Muscle weakness (generalized)  Other lack of coordination  Apraxia  Attention and concentration deficit  Other disturbances of skin sensation    Problem List Patient Active Problem List   Diagnosis Date Noted   Abnormality of gait 09/24/2019   AKI (acute kidney injury) ([facility])    Fever    Sepsis without acute organ dysfunction (HCC)    Elevated BUN    Blood pressure increase diastolic    Labile blood pressure    Leukopenia    Benign essential HTN    Hypoalbuminemia    Cerebrovascular accident (CVA) of right basal ganglia ([facility]) 07/07/2019   Acute ischemic stroke ([place])    Dyslipidemia    Dysphagia, post-stroke    Polycythemia    Stroke ([place]) 06/30/2019   Prediabetes 12/23/2016   Elevated LFTs 12/23/2016   Pure hypercholesterolemia 12/21/2016   Alcoholism ([place]) 12/21/2016   Eczema 09/18/2013   Essential hypertension 09/18/2013   Gastroesophageal reflux disease without esophagitis 09/18/2013   Tobacco abuse 09/18/2013    Mariah Milling, OTR/L 12/10/2019, 3:53 PM  [facility] [address] [place], [name], [address] Phone: [phone]   Fax:  [phone]  Name: Russell Russell MRN: [id number] Date of Birth: July 07, 1950

## 2019-12-10 NOTE — Therapy (Signed)
[facility] [address] [place], [name], [address] Phone: [phone]   Fax:  [phone]  Physical Therapy Treatment  Patient Details  Name: Russell Russell MRN: [id number] Date of Birth: [birth year] Referring Provider (PT): Lauraine Rinne, PA-C   Encounter Date: 12/10/2019   PT End of Session - 12/10/19 1322    Visit Number 33    Number of Visits 46    Date for PT Re-Evaluation 03/02/20   new POC for 8 weeks, Cert for 90 days   Authorization Type BCBS (follow medicare guidelines, 10th visit PN)    Progress Note Due on Visit 40    PT Start Time 1319    PT Stop Time 1359    PT Time Calculation (min) 40 min    Equipment Utilized During Treatment Gait belt    Activity Tolerance Patient tolerated treatment well    Behavior During Therapy WFL for tasks assessed/performed           Past Medical History:  Diagnosis Date  . Alcohol abuse    drinks 1 gallon of Gin every weekend, nothing during the week x >15 yrs  . Elevated transaminase level    +elev bili: abd u/s 06/2014 showed stable small hepatic hemangiomas, o/w normal.  . Essential hypertension   . GERD (gastroesophageal reflux disease)   . Hyperlipidemia 03/2014   Atorv started-chol improved  . Impaired fasting glucose 03/2014  . Nephrolithiasis   . PUD (peptic ulcer disease)   . Stroke ([place])   . Tobacco dependence    chantix: "psych effects"    Past Surgical History:  Procedure Laterality Date  . COLONOSCOPY  2005   Recall 10 yrs ([place])  . LOOP RECORDER INSERTION N/A 07/03/2019   Procedure: LOOP RECORDER INSERTION;  Surgeon: Thompson Grayer, MD;  Location: [facility];  Service: Cardiovascular;  Laterality: N/A;  . removal of kidney stones  2006   cystoscopic--removed from ureter.  No prob since.    There were no vitals filed for this visit.   Subjective Assessment - 12/10/19 1321    Subjective No new complatints. No falls or pain to  report. Was tired after last session.    Patient is accompained by: Family member   spouse   Pertinent History PMH: HTN, alcohol abuse (ongoing use), HLD, GERD, PUD, tobacco use    Limitations Standing;Walking;House hold activities    Patient Stated Goals return to prior level of function, walk, "wheelchair is not apart of me" (wants to get out of the w/c)    Currently in Pain? No/denies    Pain Score 0-No pain                 [facility] Adult PT Treatment/Exercise - 12/10/19 1323      Transfers   Transfers Sit to Stand;Stand to Sit    Sit to Stand 5: Supervision;With upper extremity assist;From chair/3-in-1;With armrests    Stand to Sit 5: Supervision;With upper extremity assist;With armrests;To chair/3-in-1      Ambulation/Gait   Ambulation/Gait Yes    Ambulation/Gait Assistance 4: Min guard;4: Min assist    Ambulation/Gait Assistance Details with small based quad cane with 1st rep with increased assistance as pt fatigued with cues needed on posture and increased left step length. left foot catching several times with swing phase due to decreased hip/knee flexion. 2cd lap with pt's hands on PTA shoulders with PTA in front of pt to faciliate posture, weight shfiting and with cues '"step  to my foot" with improved step length and no foot scuffting/catching noted with gait.     Ambulation Distance (Feet) 70 Feet   x2   Assistive device Small based quad cane    Gait Pattern Step-through pattern;Step-to pattern;Decreased stride length;Decreased stance time - right;Decreased step length - left    Ambulation Surface Level;Indoor      Knee/Hip Exercises: Aerobic   Other Aerobic Scifit UE/LE's level 4.0 with goal 20-30 rpm x 8 minutes for strengthening and activity tolerance. cues for pt to move in full available range to emphasize large movements               Balance Exercises - 12/10/19 1336      Balance Exercises: Standing   Standing Eyes Opened Wide (BOA);Foam/compliant  surface;Other reps (comment);Limitations    Standing Eyes Opened Limitations on airex in parallel bars; alternating arm raises, progressing to bil arm raises for ~10 reps each with min guard to min assist for balance.    Standing Eyes Closed Wide (BOA);Foam/compliant surface;3 reps;30 secs;Limitations    Standing Eyes Closed Limitations on airex in parallel bars: min guard to min assist with cues to correct left lateral lean.     SLS with Vectors Solid surface;Upper extremity assist 2;Other reps (comment);Limitations    SLS with Vectors Limitations with bil UE support on bars: alternating heel taps to 6 inch box for 10 reps each side. cues for increased hip/knee flexion on left LE. min guard assist for safety.     Balance Beam standing across red foam beam: alternating fwd stepping to floor/back onto beam, then alternating bwd stepping to floor/back onto beam for 10-12 reps each/each way with bil UE support. cues for increased step length and step height. min assist with faciliation for weight shifting and posture.                PT Short Term Goals - 12/03/19 1501      PT SHORT TERM GOAL #1   Title Patient will improve TUG to </= 30 seconds with LRAD to demo reduced fall risk (All STGs Due: 12/31/19)    Baseline 38.87 secs    Time 4    Period Weeks    Status New    Target Date 12/31/19      PT SHORT TERM GOAL #2   Title Patient will improve Berg Balance to >/= 35/56 to demonstrate improved balance and reduced fall risk    Baseline 31/56    Time 4    Period Weeks    Status New      PT SHORT TERM GOAL #3   Title Patient will improve gait speed to >/= 1.5 ft/sec with LRAD to demo improved mobility    Baseline 1.13 ft/sec    Time 4    Period Weeks    Status New             PT Long Term Goals - 12/01/19 5102      PT LONG TERM GOAL #1   Title Patient will be independent with walking program 5x/day week with wife assistance (ALL LTG Due:01/28/2020)    Baseline TBD    Time 8     Period Weeks    Status New    Target Date 01/28/20      PT LONG TERM GOAL #2   Title Patient will demonstrate ability to complete all transfers w/ LRAD at Mod I Level to demonstrate improved independence with functional mobility    Baseline supervision  with cues for safety at times, improved from min guard assist    Time --    Period --    Status Revised      PT LONG TERM GOAL #3   Title Patient will demo ability to ambulate >400 ft on outdoor/unlevel surfaces with CGA to demo improved community mobility    Baseline unable to assess today due to weather    Time 8    Period Weeks    Status Revised      PT LONG TERM GOAL #4   Title Patient will improve TUG to </= 25 seconds to demonstrate improved mobility and reduced fall risk    Baseline 38.87 secs    Time 8    Period Weeks    Status New      PT LONG TERM GOAL #5   Title Patient will improve Berg Balance to >/= 40/56 to demonstrate reduced risk for falls    Baseline 31/56    Time 8    Period Weeks    Status New      Additional Long Term Goals   Additional Long Term Goals Yes      PT LONG TERM GOAL #6   Title patient will improve gait speed to >/= 2.0 ft/sec to demonstrate improved community mobility    Baseline 1.13 ft/sec    Time 8    Period Weeks    Status New                 Plan - 12/10/19 1322    Clinical Impression Statement Today's skilled session continued to focus on strengthening, gait with quad cane and balance reactions with rest breaks taken as needed. The pt continues to need increased assistance/cues with cane due to apraxic movements The pt is progressing toward goals and should benefit from continued PT to progress toward unmet goals    Personal Factors and Comorbidities Comorbidity 3+    Comorbidities HTN, alcohol abuse (ongoing use), HLD, GERD, PUD, tobacco use    Examination-Activity Limitations Bed Mobility;Dressing;Locomotion Level;Stairs;Stand;Toileting;Transfers;Bathing     Examination-Participation Restrictions Driving;Interpersonal Relationship;Yard Work    [employer] officer Evolving/Moderate complexity    Rehab Potential Good    PT Frequency 2x / week    PT Duration 8 weeks    PT Treatment/Interventions ADLs/Self Care Home Management;Electrical Stimulation;DME Instruction;Moist Heat;Cryotherapy;Gait training;Stair training;Functional mobility training;Therapeutic activities;Therapeutic exercise;Balance training;Neuromuscular re-education;Patient/family education;Orthotic Fit/Training;Wheelchair mobility training;Passive range of motion    PT Next Visit Plan Continued gait training with [facility].Balance activities, LE strengthening    Consulted and Agree with Plan of Care Patient;Family member/caregiver    Family Member Consulted Wife           Patient will benefit from skilled therapeutic intervention in order to improve the following deficits and impairments:  Abnormal gait, Decreased coordination, Difficulty walking, Impaired tone, Decreased safety awareness, Decreased endurance, Decreased activity tolerance, Pain, Decreased balance, Decreased knowledge of use of DME, Postural dysfunction, Impaired sensation, Decreased strength, Decreased mobility  Visit Diagnosis: Hemiplegia and hemiparesis following cerebral infarction affecting left dominant side (HCC)  Unsteadiness on feet  Muscle weakness (generalized)  Other abnormalities of gait and mobility     Problem List Patient Active Problem List   Diagnosis Date Noted  . Abnormality of gait 09/24/2019  . AKI (acute kidney injury) ([place])   . Fever   . Sepsis without acute organ dysfunction ([place])   . Elevated BUN   . Blood pressure increase diastolic   . Labile blood  pressure   . Leukopenia   . Benign essential HTN   . Hypoalbuminemia   . Cerebrovascular accident (CVA) of right basal ganglia ([place]) 07/07/2019  . Acute ischemic stroke ([place])   . Dyslipidemia   . Dysphagia,  post-stroke   . Polycythemia   . Stroke ([place]) 06/30/2019  . Prediabetes 12/23/2016  . Elevated LFTs 12/23/2016  . Pure hypercholesterolemia 12/21/2016  . Alcoholism ([place]) 12/21/2016  . Eczema 09/18/2013  . Essential hypertension 09/18/2013  . Gastroesophageal reflux disease without esophagitis 09/18/2013  . Tobacco abuse 09/18/2013    [name], PTA, [facility] [address] [place], [place] [address] [phone] 12/10/19, 4:37 PM   Name: Russell Russell MRN: [id number] Date of Birth: [birth year]

## 2019-12-15 ENCOUNTER — Ambulatory Visit: Payer: Medicare Other | Attending: Physician Assistant

## 2019-12-15 ENCOUNTER — Ambulatory Visit (INDEPENDENT_AMBULATORY_CARE_PROVIDER_SITE_OTHER): Payer: Medicare Other

## 2019-12-15 ENCOUNTER — Other Ambulatory Visit: Payer: Self-pay

## 2019-12-15 ENCOUNTER — Ambulatory Visit: Payer: Medicare Other

## 2019-12-15 DIAGNOSIS — R278 Other lack of coordination: Secondary | ICD-10-CM | POA: Diagnosis present

## 2019-12-15 DIAGNOSIS — R262 Difficulty in walking, not elsewhere classified: Secondary | ICD-10-CM | POA: Insufficient documentation

## 2019-12-15 DIAGNOSIS — I6381 Other cerebral infarction due to occlusion or stenosis of small artery: Secondary | ICD-10-CM

## 2019-12-15 DIAGNOSIS — R2689 Other abnormalities of gait and mobility: Secondary | ICD-10-CM | POA: Diagnosis present

## 2019-12-15 DIAGNOSIS — R41840 Attention and concentration deficit: Secondary | ICD-10-CM | POA: Insufficient documentation

## 2019-12-15 DIAGNOSIS — R208 Other disturbances of skin sensation: Secondary | ICD-10-CM | POA: Diagnosis present

## 2019-12-15 DIAGNOSIS — M6281 Muscle weakness (generalized): Secondary | ICD-10-CM | POA: Diagnosis present

## 2019-12-15 DIAGNOSIS — R2681 Unsteadiness on feet: Secondary | ICD-10-CM | POA: Diagnosis present

## 2019-12-15 DIAGNOSIS — I69352 Hemiplegia and hemiparesis following cerebral infarction affecting left dominant side: Secondary | ICD-10-CM

## 2019-12-15 DIAGNOSIS — R482 Apraxia: Secondary | ICD-10-CM | POA: Insufficient documentation

## 2019-12-15 LAB — CUP PACEART REMOTE DEVICE CHECK
Date Time Interrogation Session: 20211004230136
Implantable Pulse Generator Implant Date: 20210423

## 2019-12-15 NOTE — Therapy (Signed)
[facility] [address] [place], [name], [address] Phone: [phone]   Fax:  [phone]  Physical Therapy Treatment  Patient Details  Name: Russell Russell MRN: [id number] Date of Birth: Dec 24, 1950 Referring Provider (PT): Lauraine Rinne, PA-C   Encounter Date: 12/15/2019   PT End of Session - 12/15/19 1053    Visit Number 34    Number of Visits 46    Date for PT Re-Evaluation 03/02/20   new POC for 8 weeks, Cert for 90 days   Authorization Type [employer] (follow medicare guidelines, 10th visit PN)    Progress Note Due on Visit 40    PT Start Time 1015    PT Stop Time 1100    PT Time Calculation (min) 45 min    Equipment Utilized During Treatment Gait belt    Activity Tolerance Patient tolerated treatment well    Behavior During Therapy WFL for tasks assessed/performed           Past Medical History:  Diagnosis Date  . Alcohol abuse    drinks 1 gallon of Gin every weekend, nothing during the week x >15 yrs  . Elevated transaminase level    +elev bili: abd u/s 06/2014 showed stable small hepatic hemangiomas, o/w normal.  . Essential hypertension   . GERD (gastroesophageal reflux disease)   . Hyperlipidemia 03/2014   Atorv started-chol improved  . Impaired fasting glucose 03/2014  . Nephrolithiasis   . PUD (peptic ulcer disease)   . Stroke ([place])   . Tobacco dependence    chantix: "psych effects"    Past Surgical History:  Procedure Laterality Date  . COLONOSCOPY  2005   Recall 10 yrs ([place] [facility])  . LOOP RECORDER INSERTION N/A 07/03/2019   Procedure: LOOP RECORDER INSERTION;  Surgeon: Thompson Grayer, MD;  Location: [facility];  Service: Cardiovascular;  Laterality: N/A;  . removal of kidney stones  2006   cystoscopic--removed from ureter.  No prob since.    There were no vitals filed for this visit.   Subjective Assessment - 12/15/19 1024    Subjective No new complatints. No falls or pain to  report. Was tired after last session.    Patient is accompained by: Family member   spouse   Pertinent History PMH: HTN, alcohol abuse (ongoing use), HLD, GERD, PUD, tobacco use    Limitations Standing;Walking;House hold activities    Patient Stated Goals return to prior level of function, walk, "wheelchair is not apart of me" (wants to get out of the w/c)                Gait training: 1 x 115' with quad cane, cues for 3 step gait pattern for safety, min A for cueing for sequencing as patient tends to use random 2 pt gait pattern and demo unsteadiness  Walking fwd and bwd: 3 x 10' with quad cane, cues for sequencing Walkin laterally with quad cane: 3 x 10' R and L  Going up and down a curb step, cues for sequencing for first 2 trial, no cueing required for 3rd trial: total 3 trials with RW                         PT Short Term Goals - 12/03/19 1501      PT SHORT TERM GOAL #1   Title Patient will improve TUG to </= 30 seconds with LRAD to demo reduced fall risk (All STGs Due:  12/31/19)    Baseline 38.87 secs    Time 4    Period Weeks    Status New    Target Date 12/31/19      PT SHORT TERM GOAL #2   Title Patient will improve Berg Balance to >/= 35/56 to demonstrate improved balance and reduced fall risk    Baseline 31/56    Time 4    Period Weeks    Status New      PT SHORT TERM GOAL #3   Title Patient will improve gait speed to >/= 1.5 ft/sec with LRAD to demo improved mobility    Baseline 1.13 ft/sec    Time 4    Period Weeks    Status New             PT Long Term Goals - 12/01/19 5643      PT LONG TERM GOAL #1   Title Patient will be independent with walking program 5x/day week with wife assistance (ALL LTG Due:01/28/2020)    Baseline TBD    Time 8    Period Weeks    Status New    Target Date 01/28/20      PT LONG TERM GOAL #2   Title Patient will demonstrate ability to complete all transfers w/ LRAD at Mod I Level to demonstrate  improved independence with functional mobility    Baseline supervision with cues for safety at times, improved from min guard assist    Time --    Period --    Status Revised      PT LONG TERM GOAL #3   Title Patient will demo ability to ambulate >400 ft on outdoor/unlevel surfaces with CGA to demo improved community mobility    Baseline unable to assess today due to weather    Time 8    Period Weeks    Status Revised      PT LONG TERM GOAL #4   Title Patient will improve TUG to </= 25 seconds to demonstrate improved mobility and reduced fall risk    Baseline 38.87 secs    Time 8    Period Weeks    Status New      PT LONG TERM GOAL #5   Title Patient will improve Berg Balance to >/= 40/56 to demonstrate reduced risk for falls    Baseline 31/56    Time 8    Period Weeks    Status New      Additional Long Term Goals   Additional Long Term Goals Yes      PT LONG TERM GOAL #6   Title patient will improve gait speed to >/= 2.0 ft/sec to demonstrate improved community mobility    Baseline 1.13 ft/sec    Time 8    Period Weeks    Status New                 Plan - 12/15/19 1027    Clinical Impression Statement Today's skilled session was focused on educating wife to promote independence in patient by doing less things for him. Currently, pt walks from car to inside home holding on wife's shoulder Pt and wife were advised to prmote independence by using walker so he can go in and out of house on his own. Wife can be there for support when needed. We practivced going up and down curb step to practice being able to go in and out of the house. We also focused on continued gait training with quad  cane to improve gait with LRAD.    Personal Factors and Comorbidities Comorbidity 3+    Comorbidities HTN, alcohol abuse (ongoing use), HLD, GERD, PUD, tobacco use    Examination-Activity Limitations Bed Mobility;Dressing;Locomotion Level;Stairs;Stand;Toileting;Transfers;Bathing     Examination-Participation Restrictions Driving;Interpersonal Relationship;Yard Work    [employer] officer Evolving/Moderate complexity    Rehab Potential Good    PT Frequency 2x / week    PT Duration 8 weeks    PT Treatment/Interventions ADLs/Self Care Home Management;Electrical Stimulation;DME Instruction;Moist Heat;Cryotherapy;Gait training;Stair training;Functional mobility training;Therapeutic activities;Therapeutic exercise;Balance training;Neuromuscular re-education;Patient/family education;Orthotic Fit/Training;Wheelchair mobility training;Passive range of motion    PT Next Visit Plan Continued gait training with [facility].Balance activities, LE strengthening    Consulted and Agree with Plan of Care Patient;Family member/caregiver    Family Member Consulted Wife           Patient will benefit from skilled therapeutic intervention in order to improve the following deficits and impairments:  Abnormal gait, Decreased coordination, Difficulty walking, Impaired tone, Decreased safety awareness, Decreased endurance, Decreased activity tolerance, Pain, Decreased balance, Decreased knowledge of use of DME, Postural dysfunction, Impaired sensation, Decreased strength, Decreased mobility  Visit Diagnosis: Hemiplegia and hemiparesis following cerebral infarction affecting left dominant side (HCC)  Unsteadiness on feet  Muscle weakness (generalized)  Other abnormalities of gait and mobility  Cerebrovascular accident (CVA) of right basal ganglia ([place])     Problem List Patient Active Problem List   Diagnosis Date Noted  . Abnormality of gait 09/24/2019  . AKI (acute kidney injury) ([place])   . Fever   . Sepsis without acute organ dysfunction ([place])   . Elevated BUN   . Blood pressure increase diastolic   . Labile blood pressure   . Leukopenia   . Benign essential HTN   . Hypoalbuminemia   . Cerebrovascular accident (CVA) of right basal ganglia ([place]) 07/07/2019    . Acute ischemic stroke ([facility] [place])   . Dyslipidemia   . Dysphagia, post-stroke   . Polycythemia   . Stroke ([place]) 06/30/2019  . Prediabetes 12/23/2016  . Elevated LFTs 12/23/2016  . Pure hypercholesterolemia 12/21/2016  . Alcoholism ([place]) 12/21/2016  . Eczema 09/18/2013  . Essential hypertension 09/18/2013  . Gastroesophageal reflux disease without esophagitis 09/18/2013  . Tobacco abuse 09/18/2013    Kerrie Pleasure, PT 12/15/2019, 10:54 AM  [facility] [address] [place], [name], [address] Phone: [phone]   Fax:  [phone]  Name: Stevon Gough MRN: [id number] Date of Birth: 1951-01-25

## 2019-12-17 ENCOUNTER — Other Ambulatory Visit: Payer: Self-pay

## 2019-12-17 ENCOUNTER — Ambulatory Visit: Payer: Medicare Other

## 2019-12-17 DIAGNOSIS — M6281 Muscle weakness (generalized): Secondary | ICD-10-CM

## 2019-12-17 DIAGNOSIS — R2681 Unsteadiness on feet: Secondary | ICD-10-CM

## 2019-12-17 DIAGNOSIS — R2689 Other abnormalities of gait and mobility: Secondary | ICD-10-CM

## 2019-12-17 DIAGNOSIS — I69352 Hemiplegia and hemiparesis following cerebral infarction affecting left dominant side: Secondary | ICD-10-CM | POA: Diagnosis not present

## 2019-12-17 DIAGNOSIS — R262 Difficulty in walking, not elsewhere classified: Secondary | ICD-10-CM

## 2019-12-17 NOTE — Therapy (Signed)
[facility] [address] [place], [name], [address] Phone: [phone]   Fax:  [phone]  Physical Therapy Treatment  Patient Details  Name: Russell Russell MRN: [id number] Date of Birth: Feb 01, 1951 Referring Provider (PT): Lauraine Rinne, PA-C   Encounter Date: 12/17/2019   PT End of Session - 12/17/19 1019    Visit Number 35    Number of Visits 46    Date for PT Re-Evaluation 03/02/20   new POC for 8 weeks, Cert for 90 days   Authorization Type BCBS (follow medicare guidelines, 10th visit PN)    Progress Note Due on Visit 40    PT Start Time 1016    PT Stop Time 1058    PT Time Calculation (min) 42 min    Equipment Utilized During Treatment Gait belt    Activity Tolerance Patient tolerated treatment well    Behavior During Therapy WFL for tasks assessed/performed           Past Medical History:  Diagnosis Date  . Alcohol abuse    drinks 1 gallon of Gin every weekend, nothing during the week x >15 yrs  . Elevated transaminase level    +elev bili: abd u/s 06/2014 showed stable small hepatic hemangiomas, o/w normal.  . Essential hypertension   . GERD (gastroesophageal reflux disease)   . Hyperlipidemia 03/2014   Atorv started-chol improved  . Impaired fasting glucose 03/2014  . Nephrolithiasis   . PUD (peptic ulcer disease)   . Stroke ([place])   . Tobacco dependence    chantix: "psych effects"    Past Surgical History:  Procedure Laterality Date  . COLONOSCOPY  2005   Recall 10 yrs ([place])  . LOOP RECORDER INSERTION N/A 07/03/2019   Procedure: LOOP RECORDER INSERTION;  Surgeon: Thompson Grayer, MD;  Location: [facility];  Service: Cardiovascular;  Laterality: N/A;  . removal of kidney stones  2006   cystoscopic--removed from ureter.  No prob since.    There were no vitals filed for this visit.   Subjective Assessment - 12/17/19 1019    Subjective No new changes/complaints since last  visit. Reports no pain and no falls.    Patient is accompained by: Family member   spouse   Pertinent History PMH: HTN, alcohol abuse (ongoing use), HLD, GERD, PUD, tobacco use    Limitations Standing;Walking;House hold activities    Patient Stated Goals return to prior level of function, walk, "wheelchair is not apart of me" (wants to get out of the w/c)    Currently in Pain? No/denies                             [facility] Adult PT Treatment/Exercise - 12/17/19 0001      Transfers   Transfers Sit to Stand;Stand to Sit    Sit to Stand 5: Supervision;With upper extremity assist;From chair/3-in-1;With armrests    Stand to Sit 5: Supervision;With upper extremity assist;With armrests;To chair/3-in-1      Ambulation/Gait   Ambulation/Gait Yes    Ambulation/Gait Assistance 4: Min guard;4: Min assist    Ambulation/Gait Assistance Details continued gait training with small base quad cane, patient intially require increased verbal cues for sequencing for proper completion. Completed ambulation 50 x 5 reps, iwth intermittent CGA and Min A required. Patient demo improved carryover with sequencing by end of session.     Ambulation Distance (Feet) 50 Feet   x 5  Assistive device Small based quad cane    Gait Pattern Step-through pattern;Step-to pattern;Decreased stride length;Decreased stance time - right;Decreased step length - left    Ambulation Surface Level;Indoor      Neuro Re-ed    Neuro Re-ed Details  Completed standing balance on blue mat without UE support: completed feet shoulder width apart with EC 3 x 30-40 seconds, progressed to completing shoulder flexion with tracking of ball overhead x 10 reps with BUE, then completed diagonal lift/chops with ball x 10 reps each direction, verbal cues for direction to task and tracking ball. Progressed to completing staggered stance completed x 10 reps of horizontal/vertical head turns. Alternating foot position followed by completing x 10  reps of each again. CGA required.                     PT Short Term Goals - 12/03/19 1501      PT SHORT TERM GOAL #1   Title Patient will improve TUG to </= 30 seconds with LRAD to demo reduced fall risk (All STGs Due: 12/31/19)    Baseline 38.87 secs    Time 4    Period Weeks    Status New    Target Date 12/31/19      PT SHORT TERM GOAL #2   Title Patient will improve Berg Balance to >/= 35/56 to demonstrate improved balance and reduced fall risk    Baseline 31/56    Time 4    Period Weeks    Status New      PT SHORT TERM GOAL #3   Title Patient will improve gait speed to >/= 1.5 ft/sec with LRAD to demo improved mobility    Baseline 1.13 ft/sec    Time 4    Period Weeks    Status New             PT Long Term Goals - 12/01/19 4562      PT LONG TERM GOAL #1   Title Patient will be independent with walking program 5x/day week with wife assistance (ALL LTG Due:01/28/2020)    Baseline TBD    Time 8    Period Weeks    Status New    Target Date 01/28/20      PT LONG TERM GOAL #2   Title Patient will demonstrate ability to complete all transfers w/ LRAD at Mod I Level to demonstrate improved independence with functional mobility    Baseline supervision with cues for safety at times, improved from min guard assist    Time --    Period --    Status Revised      PT LONG TERM GOAL #3   Title Patient will demo ability to ambulate >400 ft on outdoor/unlevel surfaces with CGA to demo improved community mobility    Baseline unable to assess today due to weather    Time 8    Period Weeks    Status Revised      PT LONG TERM GOAL #4   Title Patient will improve TUG to </= 25 seconds to demonstrate improved mobility and reduced fall risk    Baseline 38.87 secs    Time 8    Period Weeks    Status New      PT LONG TERM GOAL #5   Title Patient will improve Berg Balance to >/= 40/56 to demonstrate reduced risk for falls    Baseline 31/56    Time 8    Period  Weeks  Status New      Additional Long Term Goals   Additional Long Term Goals Yes      PT LONG TERM GOAL #6   Title patient will improve gait speed to >/= 2.0 ft/sec to demonstrate improved community mobility    Baseline 1.13 ft/sec    Time 8    Period Weeks    Status New                 Plan - 12/17/19 1223    Clinical Impression Statement Today's skilled PT session included continued gait training with small base quad cane, patient requiring intermittent CGA/Min A with completion. Demo improved sequencig throughout session. Contineud balance exercises on complaint surfaces without UE support as tolerated by patient. Will continue to progress toward all goals.    Personal Factors and Comorbidities Comorbidity 3+    Comorbidities HTN, alcohol abuse (ongoing use), HLD, GERD, PUD, tobacco use    Examination-Activity Limitations Bed Mobility;Dressing;Locomotion Level;Stairs;Stand;Toileting;Transfers;Bathing    Examination-Participation Restrictions Driving;Interpersonal Relationship;Yard Work    [employer]chant [employer] officer Evolving/Moderate complexity    Rehab Potential Good    PT Frequency 2x / week    PT Duration 8 weeks    PT Treatment/Interventions ADLs/Self Care Home Management;Electrical Stimulation;DME Instruction;Moist Heat;Cryotherapy;Gait training;Stair training;Functional mobility training;Therapeutic activities;Therapeutic exercise;Balance training;Neuromuscular re-education;Patient/family education;Orthotic Fit/Training;Wheelchair mobility training;Passive range of motion    PT Next Visit Plan Continued gait training with [facility].Balance activities, LE strengthening    Consulted and Agree with Plan of Care Patient;Family member/caregiver    Family Member Consulted Wife           Patient will benefit from skilled therapeutic intervention in order to improve the following deficits and impairments:  Abnormal gait, Decreased coordination, Difficulty  walking, Impaired tone, Decreased safety awareness, Decreased endurance, Decreased activity tolerance, Pain, Decreased balance, Decreased knowledge of use of DME, Postural dysfunction, Impaired sensation, Decreased strength, Decreased mobility  Visit Diagnosis: Difficulty in walking, not elsewhere classified  Muscle weakness (generalized)  Other abnormalities of gait and mobility  Unsteadiness on feet     Problem List Patient Active Problem List   Diagnosis Date Noted  . Abnormality of gait 09/24/2019  . AKI (acute kidney injury) ([place])   . Fever   . Sepsis without acute organ dysfunction ([place])   . Elevated BUN   . Blood pressure increase diastolic   . Labile blood pressure   . Leukopenia   . Benign essential HTN   . Hypoalbuminemia   . Cerebrovascular accident (CVA) of right basal ganglia ([place]) 07/07/2019  . Acute ischemic stroke ([place])   . Dyslipidemia   . Dysphagia, post-stroke   . Polycythemia   . Stroke ([place]) 06/30/2019  . Prediabetes 12/23/2016  . Elevated LFTs 12/23/2016  . Pure hypercholesterolemia 12/21/2016  . Alcoholism ([place]) 12/21/2016  . Eczema 09/18/2013  . Essential hypertension 09/18/2013  . Gastroesophageal reflux disease without esophagitis 09/18/2013  . Tobacco abuse 09/18/2013    Jones Bales, PT, DPT 12/17/2019, 12:29 PM  [facility] [address] [place], [name], [address] Phone: [phone]   Fax:  [phone]  Name: Russell Russell MRN: [id number] Date of Birth: [birth year]

## 2019-12-18 NOTE — Progress Notes (Signed)
[facility]link Summary Report / Loop Recorder 

## 2019-12-21 ENCOUNTER — Other Ambulatory Visit: Payer: Self-pay | Admitting: Physician Assistant

## 2019-12-22 ENCOUNTER — Ambulatory Visit: Payer: Medicare Other

## 2019-12-22 ENCOUNTER — Other Ambulatory Visit: Payer: Self-pay

## 2019-12-22 DIAGNOSIS — M6281 Muscle weakness (generalized): Secondary | ICD-10-CM

## 2019-12-22 DIAGNOSIS — R262 Difficulty in walking, not elsewhere classified: Secondary | ICD-10-CM

## 2019-12-22 DIAGNOSIS — I69352 Hemiplegia and hemiparesis following cerebral infarction affecting left dominant side: Secondary | ICD-10-CM | POA: Diagnosis not present

## 2019-12-22 DIAGNOSIS — R2689 Other abnormalities of gait and mobility: Secondary | ICD-10-CM

## 2019-12-22 DIAGNOSIS — R2681 Unsteadiness on feet: Secondary | ICD-10-CM

## 2019-12-22 NOTE — Therapy (Signed)
[facility] [address] [place], [name], [address] Phone: [phone]   Fax:  [phone]  Physical Therapy Treatment  Patient Details  Name: Russell Russell MRN: [id number] Date of Birth: [birth year] Referring Provider (PT): Lauraine Rinne, PA-C   Encounter Date: 12/22/2019   PT End of Session - 12/22/19 1003    Visit Number 36    Number of Visits 46    Date for PT Re-Evaluation 03/02/20   new POC for 8 weeks, Cert for 90 days   Authorization Type BCBS (follow medicare guidelines, 10th visit PN)    Progress Note Due on Visit 40    PT Start Time 1000    PT Stop Time 1044    PT Time Calculation (min) 44 min    Equipment Utilized During Treatment Gait belt    Activity Tolerance Patient tolerated treatment well    Behavior During Therapy WFL for tasks assessed/performed           Past Medical History:  Diagnosis Date  . Alcohol abuse    drinks 1 gallon of Gin every weekend, nothing during the week x >15 yrs  . Elevated transaminase level    +elev bili: abd u/s 06/2014 showed stable small hepatic hemangiomas, o/w normal.  . Essential hypertension   . GERD (gastroesophageal reflux disease)   . Hyperlipidemia 03/2014   Atorv started-chol improved  . Impaired fasting glucose 03/2014  . Nephrolithiasis   . PUD (peptic ulcer disease)   . Stroke ([place])   . Tobacco dependence    chantix: "psych effects"    Past Surgical History:  Procedure Laterality Date  . COLONOSCOPY  2005   Recall 10 yrs ([place])  . LOOP RECORDER INSERTION N/A 07/03/2019   Procedure: LOOP RECORDER INSERTION;  Surgeon: Thompson Grayer, MD;  Location: [facility];  Service: Cardiovascular;  Laterality: N/A;  . removal of kidney stones  2006   cystoscopic--removed from ureter.  No prob since.    There were no vitals filed for this visit.   Subjective Assessment - 12/22/19 1001    Subjective No changes since last visit. No falls and  no pain.    Patient is accompained by: Family member   spouse   Pertinent History PMH: HTN, alcohol abuse (ongoing use), HLD, GERD, PUD, tobacco use    Limitations Standing;Walking;House hold activities    Patient Stated Goals return to prior level of function, walk, "wheelchair is not apart of me" (wants to get out of the w/c)    Currently in Pain? No/denies                    [facility] Adult PT Treatment/Exercise - 12/22/19 0001      Ambulation/Gait   Ambulation/Gait Yes    Ambulation/Gait Assistance 4: Min guard;4: Min assist    Ambulation/Gait Assistance Details completed gait training with small base quad cane, patient demo difficulty sequencing initially but demo improvements with verbal cues. Cues required for improved step length on RLE to promote weight shift/stance on LLE.     Ambulation Distance (Feet) 113 Feet    Assistive device Small based quad cane    Gait Pattern Step-through pattern;Step-to pattern;Decreased stride length;Decreased stance time - right;Decreased step length - left    Ambulation Surface Level;Indoor      Neuro Re-ed    Neuro Re-ed Details  Standing in // bars completed NMR to promote improved step length and weight shift to LLE, completed stepping fwd/backward  over black balance with LLE x 15 reps, followed by RLE x 15 reps. Increased difficulty noted with stepping over with LLE as patient often takes small step and difficulty with placement requring tactile cues from PT. Standing on airex pad with feet shoulder width apart completed the following: overhead shoulder flexion with 1# weighted ball x 15 reps, standing with eyes closed 3 x 20-25 seconds, and horizontal/vertical head turns x 10 reps each direction. Patient demo hesistancy completing without UE support requiring intermittent UE support and CGA from PT. Standing across red balance beam, completed alternating steps forward and back onto balance beam with BUE support, completed x 10 reps with BLE,  then progressed to completing with single UE support x 10 reps. Increased challenge with stepping back onto beam with single UE support, PT providing faciliation at pelvis for improved weight shift. Standing on rockerboard positioned anterior/posterior, working on holding steady 3 x 1 minute each with eyes open, PT providing faciliation and verbal cues for improved posture and alignment as patient demo increased posterior sway. Progressed to completing with board positioned laterally, with focus on holding steady 3 x 1 minute each patient demo improved posture with completion, progressed to EC 2 x 15-20 seconds, increased balance challenge with vision removed often demo LOB to L side requiring UE support and CGA from PT.                     PT Short Term Goals - 12/03/19 1501      PT SHORT TERM GOAL #1   Title Patient will improve TUG to </= 30 seconds with LRAD to demo reduced fall risk (All STGs Due: 12/31/19)    Baseline 38.87 secs    Time 4    Period Weeks    Status New    Target Date 12/31/19      PT SHORT TERM GOAL #2   Title Patient will improve Berg Balance to >/= 35/56 to demonstrate improved balance and reduced fall risk    Baseline 31/56    Time 4    Period Weeks    Status New      PT SHORT TERM GOAL #3   Title Patient will improve gait speed to >/= 1.5 ft/sec with LRAD to demo improved mobility    Baseline 1.13 ft/sec    Time 4    Period Weeks    Status New             PT Long Term Goals - 12/01/19 3546      PT LONG TERM GOAL #1   Title Patient will be independent with walking program 5x/day week with wife assistance (ALL LTG Due:01/28/2020)    Baseline TBD    Time 8    Period Weeks    Status New    Target Date 01/28/20      PT LONG TERM GOAL #2   Title Patient will demonstrate ability to complete all transfers w/ LRAD at Mod I Level to demonstrate improved independence with functional mobility    Baseline supervision with cues for safety at times,  improved from min guard assist    Time --    Period --    Status Revised      PT LONG TERM GOAL #3   Title Patient will demo ability to ambulate >400 ft on outdoor/unlevel surfaces with CGA to demo improved community mobility    Baseline unable to assess today due to weather    Time 8  Period Weeks    Status Revised      PT LONG TERM GOAL #4   Title Patient will improve TUG to </= 25 seconds to demonstrate improved mobility and reduced fall risk    Baseline 38.87 secs    Time 8    Period Weeks    Status New      PT LONG TERM GOAL #5   Title Patient will improve Berg Balance to >/= 40/56 to demonstrate reduced risk for falls    Baseline 31/56    Time 8    Period Weeks    Status New      Additional Long Term Goals   Additional Long Term Goals Yes      PT LONG TERM GOAL #6   Title patient will improve gait speed to >/= 2.0 ft/sec to demonstrate improved community mobility    Baseline 1.13 ft/sec    Time 8    Period Weeks    Status New                 Plan - 12/22/19 1055    Clinical Impression Statement Continued focus of session on NMR promoting improved weight shift to LLE as well as activites to promote improved balance with reduced UE support. Increased challenge noted with vision removed and exercises promoting weight shift to LLE. Will continue to progress toward LTGs.    Personal Factors and Comorbidities Comorbidity 3+    Comorbidities HTN, alcohol abuse (ongoing use), HLD, GERD, PUD, tobacco use    Examination-Activity Limitations Bed Mobility;Dressing;Locomotion Level;Stairs;Stand;Toileting;Transfers;Bathing    Examination-Participation Restrictions Driving;Interpersonal Relationship;Yard Work    [employer]chant [employer] officer Evolving/Moderate complexity    Rehab Potential Good    PT Frequency 2x / week    PT Duration 8 weeks    PT Treatment/Interventions ADLs/Self Care Home Management;Electrical Stimulation;DME Instruction;Moist  Heat;Cryotherapy;Gait training;Stair training;Functional mobility training;Therapeutic activities;Therapeutic exercise;Balance training;Neuromuscular re-education;Patient/family education;Orthotic Fit/Training;Wheelchair mobility training;Passive range of motion    PT Next Visit Plan Continued gait training with [facility].Balance activities, LE strengthening    Consulted and Agree with Plan of Care Patient;Family member/caregiver    Family Member Consulted Wife           Patient will benefit from skilled therapeutic intervention in order to improve the following deficits and impairments:  Abnormal gait, Decreased coordination, Difficulty walking, Impaired tone, Decreased safety awareness, Decreased endurance, Decreased activity tolerance, Pain, Decreased balance, Decreased knowledge of use of DME, Postural dysfunction, Impaired sensation, Decreased strength, Decreased mobility  Visit Diagnosis: Difficulty in walking, not elsewhere classified  Muscle weakness (generalized)  Other abnormalities of gait and mobility  Unsteadiness on feet     Problem List Patient Active Problem List   Diagnosis Date Noted  . Abnormality of gait 09/24/2019  . AKI (acute kidney injury) ([place])   . Fever   . Sepsis without acute organ dysfunction ([place])   . Elevated BUN   . Blood pressure increase diastolic   . Labile blood pressure   . Leukopenia   . Benign essential HTN   . Hypoalbuminemia   . Cerebrovascular accident (CVA) of right basal ganglia ([place]) 07/07/2019  . Acute ischemic stroke ([place])   . Dyslipidemia   . Dysphagia, post-stroke   . Polycythemia   . Stroke ([place]) 06/30/2019  . Prediabetes 12/23/2016  . Elevated LFTs 12/23/2016  . Pure hypercholesterolemia 12/21/2016  . Alcoholism ([place]) 12/21/2016  . Eczema 09/18/2013  . Essential hypertension 09/18/2013  . Gastroesophageal reflux disease without esophagitis 09/18/2013  .  Tobacco abuse 09/18/2013    Jones Bales, PT,  DPT 12/22/2019, 10:56 AM  [facility] [address] [place], [name], [address] Phone: [phone]   Fax:  [phone]  Name: Russell Russell MRN: [id number] Date of Birth: 1950-06-12

## 2019-12-24 ENCOUNTER — Ambulatory Visit: Payer: Medicare Other

## 2019-12-24 ENCOUNTER — Other Ambulatory Visit: Payer: Self-pay

## 2019-12-24 DIAGNOSIS — R2681 Unsteadiness on feet: Secondary | ICD-10-CM

## 2019-12-24 DIAGNOSIS — R2689 Other abnormalities of gait and mobility: Secondary | ICD-10-CM

## 2019-12-24 DIAGNOSIS — I69352 Hemiplegia and hemiparesis following cerebral infarction affecting left dominant side: Secondary | ICD-10-CM

## 2019-12-24 DIAGNOSIS — M6281 Muscle weakness (generalized): Secondary | ICD-10-CM

## 2019-12-24 DIAGNOSIS — R262 Difficulty in walking, not elsewhere classified: Secondary | ICD-10-CM

## 2019-12-24 NOTE — Therapy (Signed)
[facility] [address] [place], [name], [address] Phone: [phone]   Fax:  [phone]  Physical Therapy Treatment  Patient Details  Name: Russell Russell MRN: [id number] Date of Birth: 04-23-1950 Referring Provider (PT): Lauraine Rinne, PA-C   Encounter Date: 12/24/2019   PT End of Session - 12/24/19 1100    Visit Number 37    Number of Visits 46    Date for PT Re-Evaluation 03/02/20   new POC for 8 weeks, Cert for 90 days   Authorization Type BCBS (follow medicare guidelines, 10th visit PN)    Progress Note Due on Visit 40    PT Start Time 1016    PT Stop Time 1100    PT Time Calculation (min) 44 min    Equipment Utilized During Treatment Gait belt    Activity Tolerance Patient tolerated treatment well    Behavior During Therapy WFL for tasks assessed/performed           Past Medical History:  Diagnosis Date   Alcohol abuse    drinks 1 gallon of Gin every weekend, nothing during the week x >15 yrs   Elevated transaminase level    +elev bili: abd u/s 06/2014 showed stable small hepatic hemangiomas, o/w normal.   Essential hypertension    GERD (gastroesophageal reflux disease)    Hyperlipidemia 03/2014   Atorv started-chol improved   Impaired fasting glucose 03/2014   Nephrolithiasis    PUD (peptic ulcer disease)    Stroke ([place])    Tobacco dependence    chantix: "psych effects"    Past Surgical History:  Procedure Laterality Date   COLONOSCOPY  2005   Recall 10 yrs ([place])   LOOP RECORDER INSERTION N/A 07/03/2019   Procedure: LOOP RECORDER INSERTION;  Surgeon: Thompson Grayer, MD;  Location: [facility];  Service: Cardiovascular;  Laterality: N/A;   removal of kidney stones  2006   cystoscopic--removed from ureter.  No prob since.    There were no vitals filed for this visit.   Subjective Assessment - 12/24/19 1020    Subjective Patient report had a good birthday,  relaxed. No falls to report. No other changes/complaints since last visit.    Patient is accompained by: Family member   spouse   Pertinent History PMH: HTN, alcohol abuse (ongoing use), HLD, GERD, PUD, tobacco use    Limitations Standing;Walking;House hold activities    Patient Stated Goals return to prior level of function, walk, "wheelchair is not apart of me" (wants to get out of the w/c)    Currently in Pain? No/denies                             [facility] Adult PT Treatment/Exercise - 12/24/19 0001      Transfers   Transfers Sit to Stand;Stand to Sit    Sit to Stand 5: Supervision;With upper extremity assist;From chair/3-in-1;With armrests    Stand to Sit 5: Supervision;With upper extremity assist;With armrests;To chair/3-in-1    Comments completed sit <> stands from elevated mat without UE suppot and holding 2# weighted ball x 10 reps. Completed second set with x 10 reps with 2# weighted ball and upon standing ball toss to PT and catching ball prior to descent. Patient require supervision but demo improved balance with completion.       Ambulation/Gait   Ambulation/Gait Yes    Ambulation/Gait Assistance 4: Min guard;5: Supervision  Ambulation/Gait Assistance Details compelted gait trainign into therapy session as patient ambulating into session with RW x 75 ft. Progressed to completing ambulation with RW x 475 ft with intermittent CGA as needed. Verbal cues for improved step length at times. Patient still continue difficulty with turns and completed decreased step length during completion.     Ambulation Distance (Feet) 475 Feet    Assistive device Rolling walker    Gait Pattern Step-through pattern;Step-to pattern;Decreased stride length;Decreased stance time - right;Decreased step length - left    Ambulation Surface Level;Indoor      Neuro Re-ed    Neuro Re-ed Details  standing without UE support completed toe tap to cone x 5 reps with RLE to promote improved  weight shift to RLE. PT providing manual faciliation for improved weight shift and verbal cues for posture and standing up tall.       Exercises   Exercises Knee/Hip      Knee/Hip Exercises: Seated   Marching Strengthening;Both;2 sets;10 reps;Limitations    Marching Limitations with red theraband, verbal cues for posture.     Hamstring Curl Strengthening;Both;2 sets;10 reps;Limitations    Hamstring Limitations completed with blue theraband on BLE, verbal cues for control.                     PT Short Term Goals - 12/03/19 1501      PT SHORT TERM GOAL #1   Title Patient will improve TUG to </= 30 seconds with LRAD to demo reduced fall risk (All STGs Due: 12/31/19)    Baseline 38.87 secs    Time 4    Period Weeks    Status New    Target Date 12/31/19      PT SHORT TERM GOAL #2   Title Patient will improve Berg Balance to >/= 35/56 to demonstrate improved balance and reduced fall risk    Baseline 31/56    Time 4    Period Weeks    Status New      PT SHORT TERM GOAL #3   Title Patient will improve gait speed to >/= 1.5 ft/sec with LRAD to demo improved mobility    Baseline 1.13 ft/sec    Time 4    Period Weeks    Status New             PT Long Term Goals - 12/01/19 7591      PT LONG TERM GOAL #1   Title Patient will be independent with walking program 5x/day week with wife assistance (ALL LTG Due:01/28/2020)    Baseline TBD    Time 8    Period Weeks    Status New    Target Date 01/28/20      PT LONG TERM GOAL #2   Title Patient will demonstrate ability to complete all transfers w/ LRAD at Mod I Level to demonstrate improved independence with functional mobility    Baseline supervision with cues for safety at times, improved from min guard assist    Time --    Period --    Status Revised      PT LONG TERM GOAL #3   Title Patient will demo ability to ambulate >400 ft on outdoor/unlevel surfaces with CGA to demo improved community mobility    Baseline  unable to assess today due to weather    Time 8    Period Weeks    Status Revised      PT LONG TERM GOAL #4  Title Patient will improve TUG to </= 25 seconds to demonstrate improved mobility and reduced fall risk    Baseline 38.87 secs    Time 8    Period Weeks    Status New      PT LONG TERM GOAL #5   Title Patient will improve Berg Balance to >/= 40/56 to demonstrate reduced risk for falls    Baseline 31/56    Time 8    Period Weeks    Status New      Additional Long Term Goals   Additional Long Term Goals Yes      PT LONG TERM GOAL #6   Title patient will improve gait speed to >/= 2.0 ft/sec to demonstrate improved community mobility    Baseline 1.13 ft/sec    Time 8    Period Weeks    Status New                 Plan - 12/24/19 1152    Clinical Impression Statement Today's skilled session included continued gait trianing with RW, patient able to ambulate prolonged distances today without rest break demonstrating improved activity tolerance. Continued BLE strengthening as tolerated by patient. Will continue to progress toward all goals.    Personal Factors and Comorbidities Comorbidity 3+    Comorbidities HTN, alcohol abuse (ongoing use), HLD, GERD, PUD, tobacco use    Examination-Activity Limitations Bed Mobility;Dressing;Locomotion Level;Stairs;Stand;Toileting;Transfers;Bathing    Examination-Participation Restrictions Driving;Interpersonal Relationship;Yard Work    [employer]chant [employer] officer Evolving/Moderate complexity    Rehab Potential Good    PT Frequency 2x / week    PT Duration 8 weeks    PT Treatment/Interventions ADLs/Self Care Home Management;Electrical Stimulation;DME Instruction;Moist Heat;Cryotherapy;Gait training;Stair training;Functional mobility training;Therapeutic activities;Therapeutic exercise;Balance training;Neuromuscular re-education;Patient/family education;Orthotic Fit/Training;Wheelchair mobility training;Passive range of motion      PT Next Visit Plan Continued gait training with [employer].Balance activities, LE strengthening    Consulted and Agree with Plan of Care Patient;Family member/caregiver    Family Member Consulted Wife           Patient will benefit from skilled therapeutic intervention in order to improve the following deficits and impairments:  Abnormal gait, Decreased coordination, Difficulty walking, Impaired tone, Decreased safety awareness, Decreased endurance, Decreased activity tolerance, Pain, Decreased balance, Decreased knowledge of use of DME, Postural dysfunction, Impaired sensation, Decreased strength, Decreased mobility  Visit Diagnosis: Difficulty in walking, not elsewhere classified  Muscle weakness (generalized)  Other abnormalities of gait and mobility  Unsteadiness on feet  Hemiplegia and hemiparesis following cerebral infarction affecting left dominant side [facility])     Problem List Patient Active Problem List   Diagnosis Date Noted   Abnormality of gait 09/24/2019   AKI (acute kidney injury) ([place])    Fever    Sepsis without acute organ dysfunction (HCC)    Elevated BUN    Blood pressure increase diastolic    Labile blood pressure    Leukopenia    Benign essential HTN    Hypoalbuminemia    Cerebrovascular accident (CVA) of right basal ganglia ([place]) 07/07/2019   Acute ischemic stroke ([place])    Dyslipidemia    Dysphagia, post-stroke    Polycythemia    Stroke ([place]) 06/30/2019   Prediabetes 12/23/2016   Elevated LFTs 12/23/2016   Pure hypercholesterolemia 12/21/2016   Alcoholism ([place]) 12/21/2016   Eczema 09/18/2013   Essential hypertension 09/18/2013   Gastroesophageal reflux disease without esophagitis 09/18/2013   Tobacco abuse 09/18/2013    Jones Bales, PT, DPT  12/24/2019, 11:59 AM  [facility] [address] [place], [name], [address] Phone: [phone]   Fax:   [phone]  Name: Moroni Nester MRN: [id number] Date of Birth: 12-Jul-1950

## 2019-12-29 ENCOUNTER — Ambulatory Visit (INDEPENDENT_AMBULATORY_CARE_PROVIDER_SITE_OTHER): Payer: Medicare Other | Admitting: Adult Health

## 2019-12-29 ENCOUNTER — Other Ambulatory Visit: Payer: Self-pay

## 2019-12-29 ENCOUNTER — Ambulatory Visit: Payer: Medicare Other

## 2019-12-29 ENCOUNTER — Encounter: Payer: Self-pay | Admitting: Adult Health

## 2019-12-29 VITALS — BP 134/94 | HR 77 | Ht 70.0 in | Wt 168.0 lb

## 2019-12-29 DIAGNOSIS — R262 Difficulty in walking, not elsewhere classified: Secondary | ICD-10-CM

## 2019-12-29 DIAGNOSIS — I639 Cerebral infarction, unspecified: Secondary | ICD-10-CM

## 2019-12-29 DIAGNOSIS — G8114 Spastic hemiplegia affecting left nondominant side: Secondary | ICD-10-CM | POA: Diagnosis not present

## 2019-12-29 DIAGNOSIS — R2689 Other abnormalities of gait and mobility: Secondary | ICD-10-CM

## 2019-12-29 DIAGNOSIS — M6281 Muscle weakness (generalized): Secondary | ICD-10-CM

## 2019-12-29 DIAGNOSIS — I69352 Hemiplegia and hemiparesis following cerebral infarction affecting left dominant side: Secondary | ICD-10-CM | POA: Diagnosis not present

## 2019-12-29 DIAGNOSIS — R2681 Unsteadiness on feet: Secondary | ICD-10-CM

## 2019-12-29 NOTE — Patient Instructions (Addendum)
Continue to work with outpatient PT for hopeful ongoing improvement  Please contact us imaging to schedule CT angio to follow up on carotid web vs thrombus (clot) You can call them at [phone]  Continue aspirin 81 mg daily  and atorvastatin for secondary stroke prevention  Loop recorder has not shown atrial fibrillation thus far - will continue to be monitored by cardiology  Continue to follow up with PCP regarding cholesterol and blood pressure management  Maintain strict control of hypertension with blood pressure goal below 130/90 and cholesterol with LDL cholesterol (bad cholesterol) goal below 70 mg/dL.     Followup in the future with me in 6 months or call earlier if needed     Thank you for coming to see [place] at [facility]. I hope we have been able to provide you high quality care today.  You may receive a patient satisfaction survey over the next few weeks. We would appreciate your feedback and comments so that we may continue to improve ourselves and the health of our patients.

## 2019-12-29 NOTE — Therapy (Signed)
[facility] [address] [place], [name], [address] Phone: [phone]   Fax:  [phone]  Physical Therapy Treatment  Patient Details  Name: Russell Russell MRN: [id number] Date of Birth: 27-Nov-1950 Referring Provider (PT): Lauraine Rinne, PA-C   Encounter Date: 12/29/2019   PT End of Session - 12/29/19 0937    Visit Number 38    Number of Visits 46    Date for PT Re-Evaluation 03/02/20   new POC for 8 weeks, Cert for 90 days   Authorization Type BCBS (follow medicare guidelines, 10th visit PN)    Progress Note Due on Visit 40    PT Start Time 0931    PT Stop Time 1014    PT Time Calculation (min) 43 min    Equipment Utilized During Treatment Gait belt    Activity Tolerance Patient tolerated treatment well    Behavior During Therapy WFL for tasks assessed/performed           Past Medical History:  Diagnosis Date   Alcohol abuse    drinks 1 gallon of Gin every weekend, nothing during the week x >15 yrs   Elevated transaminase level    +elev bili: abd u/s 06/2014 showed stable small hepatic hemangiomas, o/w normal.   Essential hypertension    GERD (gastroesophageal reflux disease)    Hyperlipidemia 03/2014   Atorv started-chol improved   Impaired fasting glucose 03/2014   Nephrolithiasis    PUD (peptic ulcer disease)    Stroke ([place])    Tobacco dependence    chantix: "psych effects"    Past Surgical History:  Procedure Laterality Date   COLONOSCOPY  2005   Recall 10 yrs ([place])   LOOP RECORDER INSERTION N/A 07/03/2019   Procedure: LOOP RECORDER INSERTION;  Surgeon: Thompson Grayer, MD;  Location: [facility];  Service: Cardiovascular;  Laterality: N/A;   removal of kidney stones  2006   cystoscopic--removed from ureter.  No prob since.    There were no vitals filed for this visit.       [facility] Adult PT Treatment/Exercise - 12/29/19 0001      Transfers   Transfers Sit to  Stand;Stand to Sit    Sit to Stand 5: Supervision    Stand to Sit 5: Supervision      Ambulation/Gait   Ambulation/Gait Yes    Ambulation/Gait Assistance 4: Min guard;5: Supervision    Ambulation/Gait Assistance Details completed gait training x 500 ft. Patient demo 1-2 instances of pushing RW to R side leading to LLE often tapping RW with stepping. Require verbal cues for improved completion. Intermittent cueing required for improved step length    Ambulation Distance (Feet) 500 Feet    Assistive device Rolling walker    Gait Pattern Step-through pattern;Step-to pattern;Decreased stride length;Decreased stance time - right;Decreased step length - left    Ambulation Surface Level;Indoor      High Level Balance   High Level Balance Activities Side stepping    High Level Balance Comments completed side stepping in // bars x 4 laps, one lap with BUE support, and progressed to single UE support for the last three laps. verbal cues required to keep hips pointed forward to promote abduction as patient demo turning trunk to compensate with hip flexion      Exercises   Exercises Knee/Hip      Knee/Hip Exercises: Standing   Lateral Step Up Left;1 set;15 reps;Hand Hold: 2;Step Height: 6";Limitations    Lateral Step  Up Limitations completed with BUE support in // bars. verbal cues for foot placement    Forward Step Up Left;1 set;15 reps;Hand Hold: 2;Step Height: 6";Limitations    Forward Step Up Limitations completed in // bars with BUE support to 6" step. PT providing verbal cues for upright posture in standing position. Also verbal cues to use LE to push up vs. arms.                Balance Exercises - 12/29/19 0001      Balance Exercises: Standing   Rockerboard Lateral;EO;Intermittent UE support;Limitations    Rockerboard Limitations standing on rockerboard positioned laterally, completed static standing x 1 minute with UE support. Progresesd to no UE support, without UE support patient  tend to shift weight to RLE. PT providing faciliation at pelvis and verbal/tactile cues to keep COG over both LE"s. Able to maintain this position with UE support, but difficulty without UE support. Completed x 5 reps.                PT Short Term Goals - 12/03/19 1501      PT SHORT TERM GOAL #1   Title Patient will improve TUG to </= 30 seconds with LRAD to demo reduced fall risk (All STGs Due: 12/31/19)    Baseline 38.87 secs    Time 4    Period Weeks    Status New    Target Date 12/31/19      PT SHORT TERM GOAL #2   Title Patient will improve Berg Balance to >/= 35/56 to demonstrate improved balance and reduced fall risk    Baseline 31/56    Time 4    Period Weeks    Status New      PT SHORT TERM GOAL #3   Title Patient will improve gait speed to >/= 1.5 ft/sec with LRAD to demo improved mobility    Baseline 1.13 ft/sec    Time 4    Period Weeks    Status New             PT Long Term Goals - 12/01/19 1245      PT LONG TERM GOAL #1   Title Patient will be independent with walking program 5x/day week with wife assistance (ALL LTG Due:01/28/2020)    Baseline TBD    Time 8    Period Weeks    Status New    Target Date 01/28/20      PT LONG TERM GOAL #2   Title Patient will demonstrate ability to complete all transfers w/ LRAD at Mod I Level to demonstrate improved independence with functional mobility    Baseline supervision with cues for safety at times, improved from min guard assist    Time --    Period --    Status Revised      PT LONG TERM GOAL #3   Title Patient will demo ability to ambulate >400 ft on outdoor/unlevel surfaces with CGA to demo improved community mobility    Baseline unable to assess today due to weather    Time 8    Period Weeks    Status Revised      PT LONG TERM GOAL #4   Title Patient will improve TUG to </= 25 seconds to demonstrate improved mobility and reduced fall risk    Baseline 38.87 secs    Time 8    Period Weeks     Status New      PT LONG TERM GOAL #5  Title Patient will improve Berg Balance to >/= 40/56 to demonstrate reduced risk for falls    Baseline 31/56    Time 8    Period Weeks    Status New      Additional Long Term Goals   Additional Long Term Goals Yes      PT LONG TERM GOAL #6   Title patient will improve gait speed to >/= 2.0 ft/sec to demonstrate improved community mobility    Baseline 1.13 ft/sec    Time 8    Period Weeks    Status New                 Plan - 12/29/19 1434    Clinical Impression Statement Continued gait training today during therapy session, as well as activities in // bars to promote improved strengthening of LLE. patient continues to demo decreased weight shift to LLE, with PT attempting to faciliate improved weight shift on rockerboard. Will continue to progress.    Personal Factors and Comorbidities Comorbidity 3+    Comorbidities HTN, alcohol abuse (ongoing use), HLD, GERD, PUD, tobacco use    Examination-Activity Limitations Bed Mobility;Dressing;Locomotion Level;Stairs;Stand;Toileting;Transfers;Bathing    Examination-Participation Restrictions Driving;Interpersonal Relationship;Yard Work    [employer]chant [employer] officer Evolving/Moderate complexity    Rehab Potential Good    PT Frequency 2x / week    PT Duration 8 weeks    PT Treatment/Interventions ADLs/Self Care Home Management;Electrical Stimulation;DME Instruction;Moist Heat;Cryotherapy;Gait training;Stair training;Functional mobility training;Therapeutic activities;Therapeutic exercise;Balance training;Neuromuscular re-education;Patient/family education;Orthotic Fit/Training;Wheelchair mobility training;Passive range of motion    PT Next Visit Plan Check STG's. Continued gait training with [employer].Balance activities, LE strengthening    Consulted and Agree with Plan of Care Patient;Family member/caregiver    Family Member Consulted Wife           Patient will benefit from  skilled therapeutic intervention in order to improve the following deficits and impairments:  Abnormal gait, Decreased coordination, Difficulty walking, Impaired tone, Decreased safety awareness, Decreased endurance, Decreased activity tolerance, Pain, Decreased balance, Decreased knowledge of use of DME, Postural dysfunction, Impaired sensation, Decreased strength, Decreased mobility  Visit Diagnosis: Difficulty in walking, not elsewhere classified  Muscle weakness (generalized)  Other abnormalities of gait and mobility  Unsteadiness on feet     Problem List Patient Active Problem List   Diagnosis Date Noted   Abnormality of gait 09/24/2019   AKI (acute kidney injury) ([place])    Fever    Sepsis without acute organ dysfunction (HCC)    Elevated BUN    Blood pressure increase diastolic    Labile blood pressure    Leukopenia    Benign essential HTN    Hypoalbuminemia    Cerebrovascular accident (CVA) of right basal ganglia ([place]) 07/07/2019   Acute ischemic stroke ([place])    Dyslipidemia    Dysphagia, post-stroke    Polycythemia    Stroke ([place]) 06/30/2019   Prediabetes 12/23/2016   Elevated LFTs 12/23/2016   Pure hypercholesterolemia 12/21/2016   Alcoholism ([place]) 12/21/2016   Eczema 09/18/2013   Essential hypertension 09/18/2013   Gastroesophageal reflux disease without esophagitis 09/18/2013   Tobacco abuse 09/18/2013    Jones Bales, PT, DPT 12/29/2019, 2:36 PM  [facility] [address] [place], [name], [address] Phone: [phone]   Fax:  [phone]  Name: Russell Russell MRN: [id number] Date of Birth: May 22, 1950

## 2019-12-29 NOTE — Progress Notes (Signed)
[facility] [address] [place]. [place] [address] [phone]       OFFICE FOLLOW UP NOTE  [name] Date of Birth:  Jul 22, 1950 Medical Record Number:  [id number]   Referring MD: Rosalin Hawking Reason for Referral: Stroke  Chief Complaint  Patient presents with  . Follow-up    rm 9  . Cerebrovascular Accident    pt has no new sx.      HPI:   Today, 12/29/2019, Russell Russell returns for 3 months stroke follow-up accompanied by his wife.  He has been doing well since prior visit with residual left-sided weakness, gait impairment and imbalance currently working with PT with continued progress.  Currently ambulating short distances with rolling walker and denies any recent falls.  Denies new or worsening stroke/TIA symptoms.  Continues on aspirin and atorvastatin for secondary stroke prevention without side effects.  Blood pressure today 134/94.  Monitors at home and typically 120-130s/80s.  Loop recorder has not shown atrial fibrillation thus far.  Reports continued tobacco and EtOH cessation.  No concerns at this time.     History provided for reference purposes only Consult visit 09/22/2019 Dr. Leonie Man: Russell Russell 69 year old male seen today for initial office consultation visit for stroke in April 2021. History is obtained from the patient and review of electronic medical records and I personally reviewed available imaging films in PACS.  He is a 69 year old male with past medical history of hypertension, hyperlipidemia, tobacco abuse and alcohol abuse who presented to [facility] on 06/30/2019 with left-sided weakness did not call for help right away.  And his wife notices in the evening she brought him to the hospital.  His NIH stroke scale was 9.  CT scan of the head showed no acute abnormality.  CT angiogram of the head and neck showed no large vessel occlusion or stenosis.  There is subtle right internal carotid artery bulb filling defect raising  possibility of carotid web versus thrombus.  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  MRI scan showed multiple scattered posterior right basal ganglia, right cerebral, right cerebellar infarcts with multiple old lacune's in both basal ganglia, thalami, pons and left cerebellum.  Carotid ultrasound showed a focal area of the right ICA bulb possibly a thrombus versus web.  2D echo showed normal ejection fraction.  Lower extremity venous Dopplers was negative for DVT.  LDL cholesterol was 109 mg percent.  Hemoglobin A1c was 6.2.  Patient had loop recorder inserted to look for paroxysmal A. fib.  Patient was discharged on 3 months of aspirin and Plavix followed by aspirin alone.  Patient states he has done well since discharge.  He is quit smoking as well as drinking alcohol.  He is currently undergoing outpatient physical and occupational therapy.  He is walking with a walker but needs a 1 person assist.  He also needs some help with activities of daily living like cutting his food but he can eat himself.  Is tolerating aspirin and Plavix well with only minor bruising.  He has had no recurrent stroke or TIA symptoms.  ROS:   14 system review of systems is positive for those listed in HPI and all other symptoms negative  PMH:  Past Medical History:  Diagnosis Date  . Alcohol abuse    drinks 1 gallon of Gin every weekend, nothing during the week x >15 yrs  . Elevated transaminase level    +elev bili: abd u/s 06/2014 showed stable small  hepatic hemangiomas, o/w normal.  . Essential hypertension   . GERD (gastroesophageal reflux disease)   . Hyperlipidemia 03/2014   Atorv started-chol improved  . Impaired fasting glucose 03/2014  . Nephrolithiasis   . PUD (peptic ulcer disease)   . Stroke ([place])   . Tobacco dependence    chantix: "psych effects"    Social History:  Social History   Socioeconomic History  . Marital status: Married    Spouse name: Not on file  . Number of children: Not  on file  . Years of education: Not on file  . Highest education level: Not on file  Occupational History  . Not on file  Tobacco Use  . Smoking status: Former Smoker    Packs/day: 0.50    Years: 20.00    Pack years: 10.00    Types: Cigarettes  . Smokeless tobacco: Never Used  Substance and Sexual Activity  . Alcohol use: Yes    Alcohol/week: 8.0 standard drinks    Types: 8 Cans of beer per week    Comment: Weekends only  . Drug use: No  . Sexual activity: Not on file  Other Topics Concern  . Not on file  Social History Narrative   Married, no children.   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   Tob: 10 pack-yr hx (current as of 03/2014).   Alcohol: 8 beers and pint of whisky on weekends only.   Denies hx of prob with drugs or alcohol.   Social Determinants of Health   Financial Resource Strain:   . Difficulty of Paying Living Expenses: Not on file  Food Insecurity:   . Worried About Charity fundraiser in the Last Year: Not on file  . Ran Out of Food in the Last Year: Not on file  Transportation Needs:   . Lack of Transportation (Medical): Not on file  . Lack of Transportation (Non-Medical): Not on file  Physical Activity:   . Days of Exercise per Week: Not on file  . Minutes of Exercise per Session: Not on file  Stress:   . Feeling of Stress : Not on file  Social Connections:   . Frequency of Communication with Friends and Family: Not on file  . Frequency of Social Gatherings with Friends and Family: Not on file  . Attends Religious Services: Not on file  . Active Member of Clubs or Organizations: Not on file  . Attends Archivist Meetings: Not on file  . Marital Status: Not on file  Intimate Partner Violence:   . Fear of Current or Ex-Partner: Not on file  . Emotionally Abused: Not on file  . Physically Abused: Not on file  . Sexually Abused: Not on file    Medications:   Current Outpatient Medications  on File Prior to Visit  Medication Sig Dispense Refill  . acetaminophen (TYLENOL) 325 MG tablet Take 2 tablets (650 mg total) by mouth every 4 (four) hours as needed for mild pain (or temp > 37.5 C (99.5 F)).    Marland Kitchen aspirin 325 MG tablet Take 1 tablet (325 mg total) by mouth daily.    Marland Kitchen atorvastatin (LIPITOR) 40 MG tablet Take 40 mg by mouth daily.    . folic acid (FOLVITE) 1 MG tablet TAKE 1 TABLET BY MOUTH EVERY DAY 90 tablet 1  . halobetasol (ULTRAVATE) 0.05 % cream APPLY TO AFFECTED AREA TWICE A DAY 50 g 3  . metoprolol tartrate (LOPRESSOR)  25 MG tablet TAKE 1/2 TABLET BY MOUTH 2 TIMES DAILY. 90 tablet 0  . Multiple Vitamin (MULTIVITAMIN WITH MINERALS) TABS tablet Take 1 tablet by mouth daily.    Marland Kitchen omeprazole (PRILOSEC) 20 MG capsule Take 20 mg by mouth as needed (acid reflux).     No current facility-administered medications on file prior to visit.    Allergies:   Allergies  Allergen Reactions  . Penicillins Rash    Physical Exam Today's Vitals   12/29/19 0830  BP: (!) 134/94  Pulse: 77  Weight: 168 lb (76.2 kg)  Height: 5\' 10"  (1.778 m)   Body mass index is 24.11 kg/m.   General: well developed, well nourished pleasant middle-aged African-American male, seated, in no evident distress Head: head normocephalic and atraumatic.   Neck: supple with no carotid or supraclavicular bruits Cardiovascular: regular rate and rhythm, no murmurs Musculoskeletal: no deformity Skin:  no rash/petichiae Vascular:  Normal pulses all extremities  Neurologic Exam Mental Status: Awake and fully alert.  Fluent speech and language.  Oriented to place and date. Recent and remote memory intact.  Attention span, concentration and fund of knowledge intact during visit.  Mood and affect appropriate.  Cranial Nerves: Pupils equal, briskly reactive to light. Extraocular movements full without nystagmus. Visual fields full to confrontation. Hearing intact. Facial sensation intact. Left lower face  weakness., tongue, palate moves normally and symmetrically.  Motor: Mild left spastic hemiparesis with 4/5 strength in upper and lower extremities.  Full strength right upper and lower extremity. Sensory.: intact to touch , pinprick , position and vibratory sensation.  Coordination: Rapid alternating movements normal in all extremities except decreased left hand. Finger-to-nose and heel-to-shin performed accurately on right side. Gait and Station: Deferred as rolling walker not present during visit Reflexes: Brisk LUE and LLE. Toes downgoing.      ASSESSMENT/PLAN: 69 year old male with recent right basal ganglia, and right cerebellum infarcts likely of cryptogenic etiology s/p loop recorder placement. 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    R BG and cerebellum stroke, cryptogenic -Residual left spastic hemiparesis and gait impairment showing improvement since prior visit - continue working with outpatient PT for hopeful ongoing improvement.  Discussed importance of use of rolling walker at all times with ambulation for fall prevention unless otherwise instructed by PT. -Loop recorder has not shown atrial fibrillation thus far - will continue to be monitored by cardiology -Continue aspirin 81 mg daily and atorvastatin 40 mg daily for secondary stroke prevention -Continue to abstain from tobacco and EtOH use -Discussed secondary stroke prevention measures and importance of close PCP follow-up and maintain strict control of hypertension with blood pressure goal below 130/90 and lipids with LDL cholesterol goal below 70 mg/dL.  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  Provided number for US imaging for wife to call and schedule follow-up imaging   Follow-up in 6 months or call earlier if needed   I spent 30  minutes of face-to-face and non-face-to-face time with patient and wife.  This included previsit chart review, lab review, study review, order entry, electronic health record documentation, patient and wife discussion and education regarding prior stroke, residual deficits, review of loop recorder, importance of managing stroke risk factors, indication for repeat imaging and answered all other questions to patient and wife satisfaction   Frann Rider, AGNP-BC  [facility]  [address] [place], [place] [address]  Phone [phone] Fax [phone] Note: This document was prepared with digital dictation and possible smart phrase technology. Any transcriptional errors that result from this process are unintentional.

## 2019-12-29 NOTE — Telephone Encounter (Signed)
medicare order sent to GI . pt wife [name] states he no longer has the [employer] plan just Medicare

## 2019-12-31 ENCOUNTER — Ambulatory Visit: Payer: Medicare Other

## 2019-12-31 ENCOUNTER — Encounter: Payer: Self-pay | Admitting: Occupational Therapy

## 2019-12-31 ENCOUNTER — Other Ambulatory Visit: Payer: Self-pay

## 2019-12-31 ENCOUNTER — Ambulatory Visit: Payer: Medicare Other | Admitting: Occupational Therapy

## 2019-12-31 DIAGNOSIS — R262 Difficulty in walking, not elsewhere classified: Secondary | ICD-10-CM

## 2019-12-31 DIAGNOSIS — R41840 Attention and concentration deficit: Secondary | ICD-10-CM

## 2019-12-31 DIAGNOSIS — I69352 Hemiplegia and hemiparesis following cerebral infarction affecting left dominant side: Secondary | ICD-10-CM

## 2019-12-31 DIAGNOSIS — M6281 Muscle weakness (generalized): Secondary | ICD-10-CM

## 2019-12-31 DIAGNOSIS — R208 Other disturbances of skin sensation: Secondary | ICD-10-CM

## 2019-12-31 DIAGNOSIS — R2689 Other abnormalities of gait and mobility: Secondary | ICD-10-CM

## 2019-12-31 DIAGNOSIS — R278 Other lack of coordination: Secondary | ICD-10-CM

## 2019-12-31 DIAGNOSIS — R2681 Unsteadiness on feet: Secondary | ICD-10-CM

## 2019-12-31 NOTE — Therapy (Signed)
[facility] [address] [place], [name], [address] Phone: [phone]   Fax:  [phone]  Occupational Therapy Treatment  Patient Details  Name: Russell Russell MRN: [id number] Date of Birth: 1950-06-17 Referring Provider (OT): [name]   Encounter Date: 12/31/2019   OT End of Session - 12/31/19 1502    Visit Number 22    Number of Visits 25    Date for OT Re-Evaluation 01/30/20    Authorization Type BCBS? "Follow Medicare Guidelines"    Authorization - Number of Visits 22    Progress Note Due on Visit 28    OT Start Time 1450    OT Stop Time 1531    OT Time Calculation (min) 41 min    Activity Tolerance Patient tolerated treatment well    Behavior During Therapy [facility] for tasks assessed/performed           Past Medical History:  Diagnosis Date  . Alcohol abuse    drinks 1 gallon of Gin every weekend, nothing during the week x >15 yrs  . Elevated transaminase level    +elev bili: abd u/s 06/2014 showed stable small hepatic hemangiomas, o/w normal.  . Essential hypertension   . GERD (gastroesophageal reflux disease)   . Hyperlipidemia 03/2014   Atorv started-chol improved  . Impaired fasting glucose 03/2014  . Nephrolithiasis   . PUD (peptic ulcer disease)   . Stroke ([place])   . Tobacco dependence    chantix: "psych effects"    Past Surgical History:  Procedure Laterality Date  . COLONOSCOPY  2005   Recall 10 yrs ([place])  . LOOP RECORDER INSERTION N/A 07/03/2019   Procedure: LOOP RECORDER INSERTION;  Surgeon: Thompson Grayer, MD;  Location: [facility];  Service: Cardiovascular;  Laterality: N/A;  . removal of kidney stones  2006   cystoscopic--removed from ureter.  No prob since.    There were no vitals filed for this visit.   Subjective Assessment - 12/31/19 1454    Subjective  Denies pain    Patient is accompanied by: Family member    Pertinent History HTN, ETOH use, smoker, loop  recorder    Currently in Pain? No/denies                   Treatment: Wrist HEP issued, min v.c Ambulating while naviagting a mod distracting environment with pt looking for hand sanitizer dispensers, mod v.c for posture and step length, min A for balance, increased time reuired, pt continues to demonstrate difficulty with turns and backing up. Functional low to midrange reaching with LUE to place graded clothespins on vertical antennae with LUE, min difficulty/ v.c for hand position             OT Education - 12/31/19 1524    Education Details wrist HEP-see pt instructions 10 reps each    Person(s) Educated Patient;Spouse    Methods Explanation    Comprehension Verbalized understanding;Returned demonstration;Need further instruction;Verbal cues required            OT Short Term Goals - 12/10/19 1552      OT SHORT TERM GOAL #1   Title Patient will compelte a home exercise program designed to improve functional use of LUE with mod cueing and set up due 09/05/19    Time 4    Period Weeks    Status Achieved    Target Date 09/05/19      OT SHORT TERM GOAL #2  Title Patient will don a front opening shirt with fasteners (buttons/zipper) with mod assist    Time 4    Period Weeks    Status Achieved      OT SHORT TERM GOAL #3   Title Patient will demonstrate ability to cut food on plate with min assist    Time 4    Period Weeks    Status Achieved      OT SHORT TERM GOAL #4   Title Patient will demonstrate low reach, grasp, release of lightweight small object (less than 1 lb) with min assist    Time 4    Period Weeks    Status Achieved      OT SHORT TERM GOAL #5   Title Patient will ambulate into bathroom and cmplete toileting with supervision and verbal cueing assistance due 11/04/19    Time 4    Period Weeks    Status Achieved      OT SHORT TERM GOAL #6   Title Patient will transport necessary items for bathing and dressing into bathroom to encourage  greater independence with showering.    Status Achieved             OT Long Term Goals - 12/10/19 1552      OT LONG TERM GOAL #1   Title Patient will complete an updated HEP to address LUE functioning due 10/05/19    Status Achieved      OT LONG TERM GOAL #2   Title Patient will walk with assistive device into bathroom to use toilet with min assist    Status Achieved      OT LONG TERM GOAL #3   Title Patient will transfer onto shower seat with min assist    Status Achieved      OT LONG TERM GOAL #4   Title Patient will obtain and release a 2-3 lb object at chest height shelf using LUE and min assist    Status Achieved      OT LONG TERM GOAL #5   Title Patient will toilet himself with modified independence due 12/04/19    Time 8    Period Weeks    Status Achieved      OT LONG TERM GOAL #6   Title Patient will shower self with supervision/set up assistance    Time 8    Period Weeks    Status Achieved      OT LONG TERM GOAL #7   Title Patient will grocery shop with wife, and will locate and retrieve at least 5 items from list with only minimal assistance due 01/30/20    Time 8    Period Weeks    Status On-going      OT LONG TERM GOAL #8   Title Patient will demonstrate at least 60* of active wrist extension without report of wrist pain (left)    Time 4    Period Weeks    Status New                 Plan - 12/31/19 1503    Clinical Impression Statement Pt is progressing towards goals with improving wrist ROM and LUE functional use .    OT Occupational Profile and History Detailed Assessment- Review of Records and additional review of physical, cognitive, psychosocial history related to current functional performance    Occupational performance deficits (Please refer to evaluation for details): ADL's;IADL's;Rest and Sleep;Leisure    Body Structure / Function / Physical Skills ADL;Coordination;Endurance;GMC;UE functional  use;Sensation;Decreased knowledge of  precautions;Balance;Body mechanics;Decreased knowledge of use of DME;Flexibility;IADL;Pain;Vision;Cardiopulmonary status limiting activity;Dexterity;FMC;Proprioception;Strength;Tone;ROM    Cognitive Skills Attention;Orientation;Thought;Perception;Problem Solve;Safety Awareness;Sequencing;Memory;Learn    Psychosocial Skills Habits;Routines and Behaviors    Rehab Potential Good    Clinical Decision Making Several treatment options, min-mod task modification necessary    Comorbidities Affecting Occupational Performance: May have comorbidities impacting occupational performance    Modification or Assistance to Complete Evaluation  Min-Moderate modification of tasks or assist with assess necessary to complete eval    OT Frequency 2x / week    OT Duration 8 weeks    OT Treatment/Interventions Self-care/ADL training;Electrical Stimulation;Therapeutic exercise;Visual/perceptual remediation/compensation;Patient/family education;Splinting;Neuromuscular education;Moist Heat;Aquatic Therapy;Balance training;Therapeutic activities;Functional Mobility Training;Fluidtherapy;DME and/or AE instruction;Manual Therapy;Cognitive remediation/compensation    Plan check on wrist exercises for increasing wrist extension, dynamic balance, functional mobility    OT Home Exercise Plan coordination HEP    Consulted and Agree with Plan of Care Patient;Family member/caregiver    Family Member Consulted Wife, [name]           Patient will benefit from skilled therapeutic intervention in order to improve the following deficits and impairments:   Body Structure / Function / Physical Skills: ADL, Coordination, Endurance, GMC, UE functional use, Sensation, Decreased knowledge of precautions, Balance, Body mechanics, Decreased knowledge of use of DME, Flexibility, IADL, Pain, Vision, Cardiopulmonary status limiting activity, Dexterity, FMC, Proprioception, Strength, Tone, ROM Cognitive Skills: Attention, Orientation, Thought,  Perception, Problem Solve, Safety Awareness, Sequencing, Memory, Learn Psychosocial Skills: Habits, Routines and Behaviors   Visit Diagnosis: Muscle weakness (generalized)  Unsteadiness on feet  Hemiplegia and hemiparesis following cerebral infarction affecting left dominant side (HCC)  Other lack of coordination  Attention and concentration deficit  Other disturbances of skin sensation    Problem List Patient Active Problem List   Diagnosis Date Noted  . Abnormality of gait 09/24/2019  . AKI (acute kidney injury) ([place])   . Fever   . Sepsis without acute organ dysfunction ([place])   . Elevated BUN   . Blood pressure increase diastolic   . Labile blood pressure   . Leukopenia   . Benign essential HTN   . Hypoalbuminemia   . Cerebrovascular accident (CVA) of right basal ganglia ([place]) 07/07/2019  . Acute ischemic stroke ([place])   . Dyslipidemia   . Dysphagia, post-stroke   . Polycythemia   . Stroke ([place]) 06/30/2019  . Prediabetes 12/23/2016  . Elevated LFTs 12/23/2016  . Pure hypercholesterolemia 12/21/2016  . Alcoholism ([place]) 12/21/2016  . Eczema 09/18/2013  . Essential hypertension 09/18/2013  . Gastroesophageal reflux disease without esophagitis 09/18/2013  . Tobacco abuse 09/18/2013    Hubert Raatz 12/31/2019, 4:14 PM  [facility] [address] [place], [name], [address] Phone: [phone]   Fax:  [phone]  Name: Daeveon Zweber MRN: [id number] Date of Birth: 10-05-1950

## 2019-12-31 NOTE — Therapy (Signed)
[facility] [address] [place], [name], [address] Phone: [phone]   Fax:  [phone]  Physical Therapy Treatment/10th visit Progress Note  Patient Details  Name: Russell Russell MRN: [id number] Date of Birth: 07-25-1950 Referring Provider (PT): Lauraine Rinne, PA-C  Physical Therapy Progress Note   Dates of Reporting Period: 12/01/2019 - 12/31/2019  See Note below for Objective Data and Assessment of Progress/Goals.  Thank you for the referral of this patient. Guillermina [place], PT, DPT    PT End of Session - 12/31/19 1535    Visit Number 8   progess note completed on 39th visit   Number of Visits 46    Date for PT Re-Evaluation 03/02/20   new POC for 8 weeks, Cert for 90 days   Authorization Type BCBS (follow medicare guidelines, 10th visit PN)    Progress Note Due on Visit 40    PT Start Time 1532    PT Stop Time 1615    PT Time Calculation (min) 43 min    Equipment Utilized During Treatment Gait belt    Activity Tolerance Patient tolerated treatment well    Behavior During Therapy WFL for tasks assessed/performed           Past Medical History:  Diagnosis Date  . Alcohol abuse    drinks 1 gallon of Gin every weekend, nothing during the week x >15 yrs  . Elevated transaminase level    +elev bili: abd u/s 06/2014 showed stable small hepatic hemangiomas, o/w normal.  . Essential hypertension   . GERD (gastroesophageal reflux disease)   . Hyperlipidemia 03/2014   Atorv started-chol improved  . Impaired fasting glucose 03/2014  . Nephrolithiasis   . PUD (peptic ulcer disease)   . Stroke ([place])   . Tobacco dependence    chantix: "psych effects"    Past Surgical History:  Procedure Laterality Date  . COLONOSCOPY  2005   Recall 10 yrs ([place])  . LOOP RECORDER INSERTION N/A 07/03/2019   Procedure: LOOP RECORDER INSERTION;  Surgeon: Thompson Grayer, MD;  Location: [facility];  Service:  Cardiovascular;  Laterality: N/A;  . removal of kidney stones  2006   cystoscopic--removed from ureter.  No prob since.    There were no vitals filed for this visit.   Subjective Assessment - 12/31/19 1534    Subjective Patient reports no new changes since last visit. No falls.  No pain.    Patient is accompained by: Family member   spouse   Pertinent History PMH: HTN, alcohol abuse (ongoing use), HLD, GERD, PUD, tobacco use    Limitations Standing;Walking;House hold activities    Patient Stated Goals return to prior level of function, walk, "wheelchair is not apart of me" (wants to get out of the w/c)    Currently in Pain? No/denies                             [facility] Adult PT Treatment/Exercise - 12/31/19 0001      Transfers   Transfers Sit to Stand;Stand to Sit    Sit to Stand 5: Supervision    Five time sit to stand comments  18.21 secs with BUE support on LE's from standard chair.     Stand to Sit 5: Supervision      Ambulation/Gait   Ambulation/Gait Yes    Ambulation/Gait Assistance 4: Min guard    Ambulation/Gait Assistance Details completed ambulation with  RW, intermittent CGA required during ambulation.     Ambulation Distance (Feet) 200 Feet    Assistive device Rolling walker    Gait Pattern Step-through pattern;Step-to pattern;Decreased stride length;Decreased stance time - right;Decreased step length - left    Ambulation Surface Level;Indoor    Gait velocity 1.18 ft/sec   w/ RW     Standardized Balance Assessment   Standardized Balance Assessment Berg Balance Test;Timed Up and Go Test      Berg Balance Test   Sit to Stand Able to stand  independently using hands    Standing Unsupported Able to stand 2 minutes with supervision    Sitting with Back Unsupported but Feet Supported on Floor or Stool Able to sit safely and securely 2 minutes    Stand to Sit Controls descent by using hands    Transfers Able to transfer safely, definite need of hands     Standing Unsupported with Eyes Closed Able to stand 10 seconds with supervision    Standing Ubsupported with Feet Together Able to place feet together independently but unable to hold for 30 seconds    From Standing, Reach Forward with Outstretched Arm Can reach forward >12 cm safely (5")    From Standing Position, Pick up Object from Floor Unable to pick up shoe, but reaches 2-5 cm (1-2") from shoe and balances independently    From Standing Position, Turn to Look Behind Over each Shoulder Turn sideways only but maintains balance    Turn 360 Degrees Needs close supervision or verbal cueing    Standing Unsupported, Alternately Place Feet on Step/Stool Able to complete >2 steps/needs minimal assist    Standing Unsupported, One Foot in Front Needs help to step but can hold 15 seconds    Standing on One Leg Tries to lift leg/unable to hold 3 seconds but remains standing independently    Total Score 32      Timed Up and Go Test   TUG Normal TUG    Normal TUG (seconds) 35.85   w/ RW             PT Education - 12/31/19 1641    Education Details Educated on progress toward STG's, Complete walking program daily    Person(s) Educated Patient;Spouse    Methods Explanation    Comprehension Verbalized understanding            PT Short Term Goals - 12/31/19 1550      PT SHORT TERM GOAL #1   Title Patient will improve TUG to </= 30 seconds with LRAD to demo reduced fall risk (All STGs Due: 12/31/19)    Baseline 38.87 secs, 35.85 secs    Time 4    Period Weeks    Status Not Met    Target Date 12/31/19      PT SHORT TERM GOAL #2   Title Patient will improve Berg Balance to >/= 35/56 to demonstrate improved balance and reduced fall risk    Baseline 31/56, 32/56    Time 4    Period Weeks    Status Not Met      PT SHORT TERM GOAL #3   Title Patient will improve gait speed to >/= 1.5 ft/sec with LRAD to demo improved mobility    Baseline 1.13 ft/sec, 1.18 ft/sec    Time 4    Period  Weeks    Status Not Met             PT Long Term Goals - 12/01/19  0853      PT LONG TERM GOAL #1   Title Patient will be independent with walking program 5x/day week with wife assistance (ALL LTG Due:01/28/2020)    Baseline TBD    Time 8    Period Weeks    Status New    Target Date 01/28/20      PT LONG TERM GOAL #2   Title Patient will demonstrate ability to complete all transfers w/ LRAD at Mod I Level to demonstrate improved independence with functional mobility    Baseline supervision with cues for safety at times, improved from min guard assist    Time --    Period --    Status Revised      PT LONG TERM GOAL #3   Title Patient will demo ability to ambulate >400 ft on outdoor/unlevel surfaces with CGA to demo improved community mobility    Baseline unable to assess today due to weather    Time 8    Period Weeks    Status Revised      PT LONG TERM GOAL #4   Title Patient will improve TUG to </= 25 seconds to demonstrate improved mobility and reduced fall risk    Baseline 38.87 secs    Time 8    Period Weeks    Status New      PT LONG TERM GOAL #5   Title Patient will improve Berg Balance to >/= 40/56 to demonstrate reduced risk for falls    Baseline 31/56    Time 8    Period Weeks    Status New      Additional Long Term Goals   Additional Long Term Goals Yes      PT LONG TERM GOAL #6   Title patient will improve gait speed to >/= 2.0 ft/sec to demonstrate improved community mobility    Baseline 1.13 ft/sec    Time 8    Period Weeks    Status New                 Plan - 12/31/19 1638    Clinical Impression Statement Today's skilled PT session included assessment of patient's progress toward STG's. Patient unable to meet any STG today during session with completion of TUG and Berg, however did demosntrate progress toward all goals. Patient is still at high fall risk Berg Balance Score of 32/56. Patient will continue to progress toward all LTG's to  mazimize functional mobility.    Personal Factors and Comorbidities Comorbidity 3+    Comorbidities HTN, alcohol abuse (ongoing use), HLD, GERD, PUD, tobacco use    Examination-Activity Limitations Bed Mobility;Dressing;Locomotion Level;Stairs;Stand;Toileting;Transfers;Bathing    Examination-Participation Restrictions Driving;Interpersonal Relationship;Yard Work    [employer] Evolving/Moderate complexity    Rehab Potential Good    PT Frequency 2x / week    PT Duration 8 weeks    PT Treatment/Interventions ADLs/Self Care Home Management;Electrical Stimulation;DME Instruction;Moist Heat;Cryotherapy;Gait training;Stair training;Functional mobility training;Therapeutic activities;Therapeutic exercise;Balance training;Neuromuscular re-education;Patient/family education;Orthotic Fit/Training;Wheelchair mobility training;Passive range of motion    PT Next Visit Plan Update HEP, Continued gait training with [employer].Balance activities, LE strengthening    Consulted and Agree with Plan of Care Patient;Family member/caregiver    Family Member Consulted Wife           Patient will benefit from skilled therapeutic intervention in order to improve the following deficits and impairments:  Abnormal gait, Decreased coordination, Difficulty walking, Impaired tone, Decreased safety awareness, Decreased endurance, Decreased activity tolerance, Pain, Decreased  balance, Decreased knowledge of use of DME, Postural dysfunction, Impaired sensation, Decreased strength, Decreased mobility  Visit Diagnosis: Difficulty in walking, not elsewhere classified  Muscle weakness (generalized)  Unsteadiness on feet  Other abnormalities of gait and mobility  Hemiplegia and hemiparesis following cerebral infarction affecting left dominant side [facility])     Problem List Patient Active Problem List   Diagnosis Date Noted  . Abnormality of gait 09/24/2019  . AKI (acute kidney injury) ([place])     . Fever   . Sepsis without acute organ dysfunction ([place])   . Elevated BUN   . Blood pressure increase diastolic   . Labile blood pressure   . Leukopenia   . Benign essential HTN   . Hypoalbuminemia   . Cerebrovascular accident (CVA) of right basal ganglia ([place]) 07/07/2019  . Acute ischemic stroke ([place])   . Dyslipidemia   . Dysphagia, post-stroke   . Polycythemia   . Stroke ([place]) 06/30/2019  . Prediabetes 12/23/2016  . Elevated LFTs 12/23/2016  . Pure hypercholesterolemia 12/21/2016  . Alcoholism ([place]) 12/21/2016  . Eczema 09/18/2013  . Essential hypertension 09/18/2013  . Gastroesophageal reflux disease without esophagitis 09/18/2013  . Tobacco abuse 09/18/2013    Jones Bales, PT, DPT 12/31/2019, 4:44 PM  [facility] [address] [place], [name], [address] Phone: [phone]   Fax:  [phone]  Name: Russell Russell MRN: [id number] Date of Birth: 24-May-1950

## 2019-12-31 NOTE — Patient Instructions (Signed)
AROM: Wrist Extension   .  With _left ___ palm down, bend wrist up. Repeat __15__ times per set.  Do __2-3_ sessions per day.    AROM: Forearm Pronation / Supination   With __left__ arm in handshake position, slowly rotate palm down until stretch is felt. Relax. Then rotate palm up until stretch is felt. Repeat _15___ times per set. Do _2-3__ sessions per day.  Copyright  VHI. All rights reserved.

## 2020-01-01 NOTE — Progress Notes (Signed)
I agree with the above plan 

## 2020-01-05 ENCOUNTER — Encounter: Payer: Self-pay | Admitting: Occupational Therapy

## 2020-01-05 ENCOUNTER — Other Ambulatory Visit: Payer: Self-pay

## 2020-01-05 ENCOUNTER — Ambulatory Visit: Payer: Medicare Other | Admitting: Occupational Therapy

## 2020-01-05 ENCOUNTER — Ambulatory Visit: Payer: Medicare Other

## 2020-01-05 DIAGNOSIS — I69352 Hemiplegia and hemiparesis following cerebral infarction affecting left dominant side: Secondary | ICD-10-CM

## 2020-01-05 DIAGNOSIS — R41840 Attention and concentration deficit: Secondary | ICD-10-CM

## 2020-01-05 DIAGNOSIS — R262 Difficulty in walking, not elsewhere classified: Secondary | ICD-10-CM

## 2020-01-05 DIAGNOSIS — R208 Other disturbances of skin sensation: Secondary | ICD-10-CM

## 2020-01-05 DIAGNOSIS — M6281 Muscle weakness (generalized): Secondary | ICD-10-CM

## 2020-01-05 DIAGNOSIS — R278 Other lack of coordination: Secondary | ICD-10-CM

## 2020-01-05 DIAGNOSIS — R482 Apraxia: Secondary | ICD-10-CM

## 2020-01-05 DIAGNOSIS — R2681 Unsteadiness on feet: Secondary | ICD-10-CM

## 2020-01-05 DIAGNOSIS — R2689 Other abnormalities of gait and mobility: Secondary | ICD-10-CM

## 2020-01-05 NOTE — Therapy (Signed)
[facility] [address] [place], [name], [address] Phone: [phone]   Fax:  [phone]  Occupational Therapy Treatment  Patient Details  Name: Russell Russell MRN: [id number] Date of Birth: May 30, 1950 Referring Provider (OT): [name]   Encounter Date: 01/05/2020   OT End of Session - 01/05/20 1903    Visit Number 23    Number of Visits 25    Date for OT Re-Evaluation 01/30/20    Authorization Type BCBS? "Follow Medicare Guidelines"    Authorization - Number of Visits 23    Progress Note Due on Visit 28    OT Start Time 1745    OT Stop Time 1830    OT Time Calculation (min) 45 min    Activity Tolerance Patient tolerated treatment well    Behavior During Therapy [facility] for tasks assessed/performed           Past Medical History:  Diagnosis Date  . Alcohol abuse    drinks 1 gallon of Gin every weekend, nothing during the week x >15 yrs  . Elevated transaminase level    +elev bili: abd u/s 06/2014 showed stable small hepatic hemangiomas, o/w normal.  . Essential hypertension   . GERD (gastroesophageal reflux disease)   . Hyperlipidemia 03/2014   Atorv started-chol improved  . Impaired fasting glucose 03/2014  . Nephrolithiasis   . PUD (peptic ulcer disease)   . Stroke ([place])   . Tobacco dependence    chantix: "psych effects"    Past Surgical History:  Procedure Laterality Date  . COLONOSCOPY  2005   Recall 10 yrs ([place])  . LOOP RECORDER INSERTION N/A 07/03/2019   Procedure: LOOP RECORDER INSERTION;  Surgeon: Thompson Grayer, MD;  Location: [facility];  Service: Cardiovascular;  Laterality: N/A;  . removal of kidney stones  2006   cystoscopic--removed from ureter.  No prob since.    There were no vitals filed for this visit.   Subjective Assessment - 01/05/20 1901    Subjective  Going good    Patient is accompanied by: Family member    Pertinent History HTN, ETOH use, smoker, loop  recorder    Currently in Pain? No/denies    Pain Score 0-No pain                        OT Treatments/Exercises (OP) - 01/05/20 0001      Neurological Re-education Exercises   Other Exercises 1 Neuromuscular reeducation to address hand/wrist/forearm coordination.  Patient with slower movements in left hand, and automatically uses right hand to compensate.  Spent this session addressing in hand manipulation, grasp, release, pinch, moving objects from ulnar to radial aspect of hand, and functional wrist extension.                      OT Short Term Goals - 01/05/20 1906      OT SHORT TERM GOAL #1   Title Patient will compelte a home exercise program designed to improve functional use of LUE with mod cueing and set up due 09/05/19    Time 4    Period Weeks    Status Achieved    Target Date 09/05/19      OT SHORT TERM GOAL #2   Title Patient will don a front opening shirt with fasteners (buttons/zipper) with mod assist    Time 4    Period Weeks    Status Achieved  OT SHORT TERM GOAL #3   Title Patient will demonstrate ability to cut food on plate with min assist    Time 4    Period Weeks    Status Achieved      OT SHORT TERM GOAL #4   Title Patient will demonstrate low reach, grasp, release of lightweight small object (less than 1 lb) with min assist    Time 4    Period Weeks    Status Achieved      OT SHORT TERM GOAL #5   Title Patient will ambulate into bathroom and cmplete toileting with supervision and verbal cueing assistance due 11/04/19    Time 4    Period Weeks    Status Achieved      OT SHORT TERM GOAL #6   Title Patient will transport necessary items for bathing and dressing into bathroom to encourage greater independence with showering.    Status Achieved             OT Long Term Goals - 01/05/20 1906      OT LONG TERM GOAL #1   Title Patient will complete an updated HEP to address LUE functioning due 10/05/19    Status  Achieved      OT LONG TERM GOAL #2   Title Patient will walk with assistive device into bathroom to use toilet with min assist    Status Achieved      OT LONG TERM GOAL #3   Title Patient will transfer onto shower seat with min assist    Status Achieved      OT LONG TERM GOAL #4   Title Patient will obtain and release a 2-3 lb object at chest height shelf using LUE and min assist    Status Achieved      OT LONG TERM GOAL #5   Title Patient will toilet himself with modified independence due 12/04/19    Time 8    Period Weeks    Status Achieved      OT LONG TERM GOAL #6   Title Patient will shower self with supervision/set up assistance    Time 8    Period Weeks    Status Achieved      OT LONG TERM GOAL #7   Title Patient will grocery shop with wife, and will locate and retrieve at least 5 items from list with only minimal assistance due 01/30/20    Time 8    Period Weeks    Status On-going      OT LONG TERM GOAL #8   Title Patient will demonstrate at least 60* of active wrist extension without report of wrist pain (left)    Time 4    Period Weeks    Status New                 Plan - 01/05/20 1905    Clinical Impression Statement Pt is progressing towards goals with improving wrist ROM and LUE functional use .    OT Occupational Profile and History Detailed Assessment- Review of Records and additional review of physical, cognitive, psychosocial history related to current functional performance    Occupational performance deficits (Please refer to evaluation for details): ADL's;IADL's;Rest and Sleep;Leisure    Body Structure / Function / Physical Skills ADL;Coordination;Endurance;GMC;UE functional use;Sensation;Decreased knowledge of precautions;Balance;Body mechanics;Decreased knowledge of use of DME;Flexibility;IADL;Pain;Vision;Cardiopulmonary status limiting activity;Dexterity;FMC;Proprioception;Strength;Tone;ROM    Cognitive Skills  Attention;Orientation;Thought;Perception;Problem Solve;Safety Awareness;Sequencing;Memory;Learn    Psychosocial Skills Habits;Routines and Behaviors    Rehab  Potential Good    Clinical Decision Making Several treatment options, min-mod task modification necessary    Comorbidities Affecting Occupational Performance: May have comorbidities impacting occupational performance    Modification or Assistance to Complete Evaluation  Min-Moderate modification of tasks or assist with assess necessary to complete eval    OT Frequency 2x / week    OT Duration 8 weeks    OT Treatment/Interventions Self-care/ADL training;Electrical Stimulation;Therapeutic exercise;Visual/perceptual remediation/compensation;Patient/family education;Splinting;Neuromuscular education;Moist Heat;Aquatic Therapy;Balance training;Therapeutic activities;Functional Mobility Training;Fluidtherapy;DME and/or AE instruction;Manual Therapy;Cognitive remediation/compensation    Plan check on wrist exercises for increasing wrist extension, dynamic balance, functional mobility    OT Home Exercise Plan coordination HEP    Consulted and Agree with Plan of Care Patient;Family member/caregiver    Family Member Consulted Wife, [name]           Patient will benefit from skilled therapeutic intervention in order to improve the following deficits and impairments:   Body Structure / Function / Physical Skills: ADL, Coordination, Endurance, GMC, UE functional use, Sensation, Decreased knowledge of precautions, Balance, Body mechanics, Decreased knowledge of use of DME, Flexibility, IADL, Pain, Vision, Cardiopulmonary status limiting activity, Dexterity, FMC, Proprioception, Strength, Tone, ROM Cognitive Skills: Attention, Orientation, Thought, Perception, Problem Solve, Safety Awareness, Sequencing, Memory, Learn Psychosocial Skills: Habits, Routines and Behaviors   Visit Diagnosis: Hemiplegia and hemiparesis following cerebral infarction  affecting left dominant side (H[facility])  Unsteadiness on feet  Muscle weakness (generalized)  Other lack of coordination  Attention and concentration deficit  Other disturbances of skin sensation  Apraxia    Problem List Patient Active Problem List   Diagnosis Date Noted  . Abnormality of gait 09/24/2019  . AKI (acute kidney injury) ([place])   . Fever   . Sepsis without acute organ dysfunction ([place])   . Elevated BUN   . Blood pressure increase diastolic   . Labile blood pressure   . Leukopenia   . Benign essential HTN   . Hypoalbuminemia   . Cerebrovascular accident (CVA) of right basal ganglia ([place]) 07/07/2019  . Acute ischemic stroke ([place])   . Dyslipidemia   . Dysphagia, post-stroke   . Polycythemia   . Stroke ([place]) 06/30/2019  . Prediabetes 12/23/2016  . Elevated LFTs 12/23/2016  . Pure hypercholesterolemia 12/21/2016  . Alcoholism ([place]) 12/21/2016  . Eczema 09/18/2013  . Essential hypertension 09/18/2013  . Gastroesophageal reflux disease without esophagitis 09/18/2013  . Tobacco abuse 09/18/2013    Mariah Milling, OTR/L 01/05/2020, 7:07 PM  [facility] [address] [place], [name], [address] Phone: [phone]   Fax:  [phone]  Name: Russell Russell MRN: [id number] Date of Birth: 1950/12/13

## 2020-01-05 NOTE — Therapy (Signed)
[facility] [address] [place], [name], [address] Phone: [phone]   Fax:  [phone]  Physical Therapy Treatment/Progress Note  Patient Details  Name: Russell Russell MRN: [id number] Date of Birth: [birth year] Referring Provider (PT): Lauraine Rinne, PA-C Physical Therapy Progress Note   Dates of Reporting Period: 12/01/2019 - 01/05/2020  See Note below for Objective Data and Assessment of Progress/Goals.  Thank you for the referral of this patient. Guillermina [place], PT, DPT   Encounter Date: 01/05/2020   PT End of Session - 01/05/20 1705    Visit Number 40    Number of Visits 46    Date for PT Re-Evaluation 03/02/20   new POC for 8 weeks, Cert for 90 days   Authorization Type BCBS (follow medicare guidelines, 10th visit PN)    Progress Note Due on Visit 40    PT Start Time 1703    PT Stop Time 1744    PT Time Calculation (min) 41 min    Equipment Utilized During Treatment Gait belt    Activity Tolerance Patient tolerated treatment well    Behavior During Therapy WFL for tasks assessed/performed           Past Medical History:  Diagnosis Date  . Alcohol abuse    drinks 1 gallon of Gin every weekend, nothing during the week x >15 yrs  . Elevated transaminase level    +elev bili: abd u/s 06/2014 showed stable small hepatic hemangiomas, o/w normal.  . Essential hypertension   . GERD (gastroesophageal reflux disease)   . Hyperlipidemia 03/2014   Atorv started-chol improved  . Impaired fasting glucose 03/2014  . Nephrolithiasis   . PUD (peptic ulcer disease)   . Stroke ([place])   . Tobacco dependence    chantix: "psych effects"    Past Surgical History:  Procedure Laterality Date  . COLONOSCOPY  2005   Recall 10 yrs ([place])  . LOOP RECORDER INSERTION N/A 07/03/2019   Procedure: LOOP RECORDER INSERTION;  Surgeon: Thompson Grayer, MD;  Location: [facility];  Service: Cardiovascular;   Laterality: N/A;  . removal of kidney stones  2006   cystoscopic--removed from ureter.  No prob since.    There were no vitals filed for this visit.   Subjective Assessment - 01/05/20 1705    Subjective Patient reports no new changes. Reports that he is getting up and walking 1x every hour. No pain or falls to report.    Patient is accompained by: Family member   spouse   Pertinent History PMH: HTN, alcohol abuse (ongoing use), HLD, GERD, PUD, tobacco use    Limitations Standing;Walking;House hold activities    Patient Stated Goals return to prior level of function, walk, "wheelchair is not apart of me" (wants to get out of the w/c)    Currently in Pain? No/denies                             [facility] Adult PT Treatment/Exercise - 01/05/20 0001      Ambulation/Gait   Ambulation/Gait Yes    Ambulation/Gait Assistance 4: Min guard    Ambulation/Gait Assistance Details x 115 ft with RW, 80' x 3 reps with SBQC. Patient continues to demonstrate decreased step length with ambulation with both AD. As well as poor posture due to visual reliance to see placement of BLE's. Max verbal cues required for improvement in posture and scanning environment.  Ambulation Distance (Feet) 115 Feet   x1, 80 x 3   Assistive device Rolling walker;Small based quad cane    Gait Pattern Step-through pattern;Step-to pattern;Decreased stride length;Decreased stance time - right;Decreased step length - left    Ambulation Surface Level;Indoor      Timed Up and Go Test   TUG Normal TUG    Normal TUG (seconds) 33.78   w/ RW     High Level Balance   High Level Balance Activities Side stepping    High Level Balance Comments along countertop, completed side stepping with BUE support x 2 laps, down and back. Patient demos ER of LLE with completion, require max verbal cues and tactile cues for proper technique. Verbal cues also required to avoid dragging of LLE when following.       Neuro Re-ed     Neuro Re-ed Details  At countertop: completed alternating toe taps to bilateral cones x 15 reps completed with single UE support (LUE), patient demo difficulty with slowed completion and control with toe tap w/ RLE due to decreased stance tolerance with LLE. Requiring verbal cues from PT and faciliation at pelvis for improved weight shift.  Completed stepping over orange hurdles with single UE support from countertop, patient unable to complete reciprocal stepping pattern and completing step to pattern. Verbal cues for improved hip/knee flexion to promote clearance. Completed x 3 laps, down and back. CGA throughout.                     PT Short Term Goals - 12/31/19 1550      PT SHORT TERM GOAL #1   Title Patient will improve TUG to </= 30 seconds with LRAD to demo reduced fall risk (All STGs Due: 12/31/19)    Baseline 38.87 secs, 35.85 secs    Time 4    Period Weeks    Status Not Met    Target Date 12/31/19      PT SHORT TERM GOAL #2   Title Patient will improve Berg Balance to >/= 35/56 to demonstrate improved balance and reduced fall risk    Baseline 31/56, 32/56    Time 4    Period Weeks    Status Not Met      PT SHORT TERM GOAL #3   Title Patient will improve gait speed to >/= 1.5 ft/sec with LRAD to demo improved mobility    Baseline 1.13 ft/sec, 1.18 ft/sec    Time 4    Period Weeks    Status Not Met             PT Long Term Goals - 12/01/19 2800      PT LONG TERM GOAL #1   Title Patient will be independent with walking program 5x/day week with wife assistance (ALL LTG Due:01/28/2020)    Baseline TBD    Time 8    Period Weeks    Status New    Target Date 01/28/20      PT LONG TERM GOAL #2   Title Patient will demonstrate ability to complete all transfers w/ LRAD at Mod I Level to demonstrate improved independence with functional mobility    Baseline supervision with cues for safety at times, improved from min guard assist    Time --    Period --     Status Revised      PT LONG TERM GOAL #3   Title Patient will demo ability to ambulate >400 ft on outdoor/unlevel surfaces with CGA  to demo improved community mobility    Baseline unable to assess today due to weather    Time 8    Period Weeks    Status Revised      PT LONG TERM GOAL #4   Title Patient will improve TUG to </= 25 seconds to demonstrate improved mobility and reduced fall risk    Baseline 38.87 secs    Time 8    Period Weeks    Status New      PT LONG TERM GOAL #5   Title Patient will improve Berg Balance to >/= 40/56 to demonstrate reduced risk for falls    Baseline 31/56    Time 8    Period Weeks    Status New      Additional Long Term Goals   Additional Long Term Goals Yes      PT LONG TERM GOAL #6   Title patient will improve gait speed to >/= 2.0 ft/sec to demonstrate improved community mobility    Baseline 1.13 ft/sec    Time 8    Period Weeks    Status New                 Plan - 01/05/20 1925    Clinical Impression Statement Continued gait training with various AD today, patient continues to demonstrate decreased step length and require verbal cues for improved posture with ambulation. TUG reassessed for progress today, with patient able to complete in 33.78 seconds with RW. Contineud activites to promote improved balance and step length. Will continue to progress toward all LTGs.    Personal Factors and Comorbidities Comorbidity 3+    Comorbidities HTN, alcohol abuse (ongoing use), HLD, GERD, PUD, tobacco use    Examination-Activity Limitations Bed Mobility;Dressing;Locomotion Level;Stairs;Stand;Toileting;Transfers;Bathing    Examination-Participation Restrictions Driving;Interpersonal Relationship;Yard Work    [employer] Evolving/Moderate complexity    Rehab Potential Good    PT Frequency 2x / week    PT Duration 8 weeks    PT Treatment/Interventions ADLs/Self Care Home Management;Electrical Stimulation;DME  Instruction;Moist Heat;Cryotherapy;Gait training;Stair training;Functional mobility training;Therapeutic activities;Therapeutic exercise;Balance training;Neuromuscular re-education;Patient/family education;Orthotic Fit/Training;Wheelchair mobility training;Passive range of motion    PT Next Visit Plan Update HEP, Continued gait training with Small [employer].Balance activities, LE strengthening    Consulted and Agree with Plan of Care Patient;Family member/caregiver    Family Member Consulted Wife           Patient will benefit from skilled therapeutic intervention in order to improve the following deficits and impairments:  Abnormal gait, Decreased coordination, Difficulty walking, Impaired tone, Decreased safety awareness, Decreased endurance, Decreased activity tolerance, Pain, Decreased balance, Decreased knowledge of use of DME, Postural dysfunction, Impaired sensation, Decreased strength, Decreased mobility  Visit Diagnosis: Muscle weakness (generalized)  Unsteadiness on feet  Difficulty in walking, not elsewhere classified  Hemiplegia and hemiparesis following cerebral infarction affecting left dominant side (HCC)  Other abnormalities of gait and mobility     Problem List Patient Active Problem List   Diagnosis Date Noted  . Abnormality of gait 09/24/2019  . AKI (acute kidney injury) ([place])   . Fever   . Sepsis without acute organ dysfunction ([place])   . Elevated BUN   . Blood pressure increase diastolic   . Labile blood pressure   . Leukopenia   . Benign essential HTN   . Hypoalbuminemia   . Cerebrovascular accident (CVA) of right basal ganglia ([place]) 07/07/2019  . Acute ischemic stroke ([place])   . Dyslipidemia   .  Dysphagia, post-stroke   . Polycythemia   . Stroke ([place]) 06/30/2019  . Prediabetes 12/23/2016  . Elevated LFTs 12/23/2016  . Pure hypercholesterolemia 12/21/2016  . Alcoholism ([place]) 12/21/2016  . Eczema 09/18/2013  . Essential hypertension 09/18/2013  .  Gastroesophageal reflux disease without esophagitis 09/18/2013  . Tobacco abuse 09/18/2013    Jones Bales, PT, DPT 01/05/2020, 7:27 PM  [facility] [address] [place], [name], [address] Phone: [phone]   Fax:  [phone]  Name: Russell Russell MRN: [id number] Date of Birth: [birth year]

## 2020-01-06 ENCOUNTER — Ambulatory Visit
Admission: RE | Admit: 2020-01-06 | Discharge: 2020-01-06 | Disposition: A | Discharge status: Home / Self Care | Payer: Medicare Other | Source: Ambulatory Visit | Attending: Neurology | Admitting: Neurology

## 2020-01-06 DIAGNOSIS — I639 Cerebral infarction, unspecified: Secondary | ICD-10-CM

## 2020-01-06 IMAGING — CT CT ANGIO NECK
2 of 3 series · 8 of 32 positions shown, 11 images · IV contrast (iopamidol)
Comparison: [DATE] CTA head neck

CLINICAL DATA: Stroke follow-up

EXAM:
CT ANGIOGRAPHY NECK
TECHNIQUE: Multidetector CT imaging of the neck was performed using the
standard protocol during bolus administration of intravenous
contrast. Multiplanar CT image reconstructions and MIPs were
obtained to evaluate the vascular anatomy. Carotid stenosis
measurements (when applicable) are obtained utilizing NASCET
criteria, using the distal internal carotid diameter as the
denominator.
CONTRAST:  75mL [ZO] IOPAMIDOL ([ZO]) INJECTION 76%

[Series 5: carotid angio · axial · 0.54mm/px · z∈[-269,-97]mm · 6 of 122 slices shown]
[im 18/122  soft-tissue]
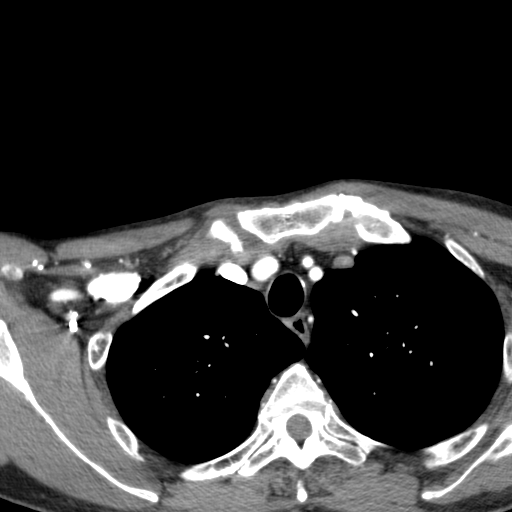
[im 35/122  soft-tissue]
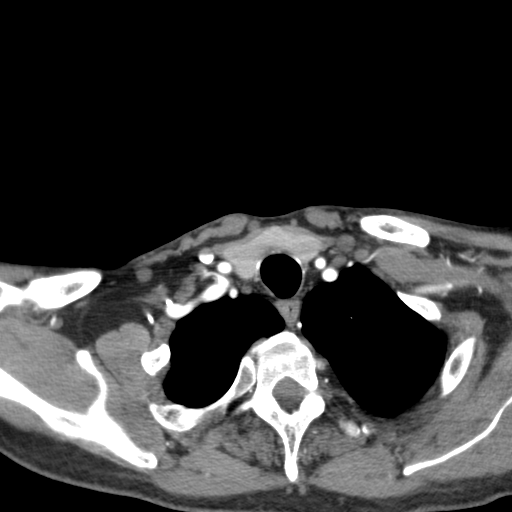
[im 52/122  soft-tissue]
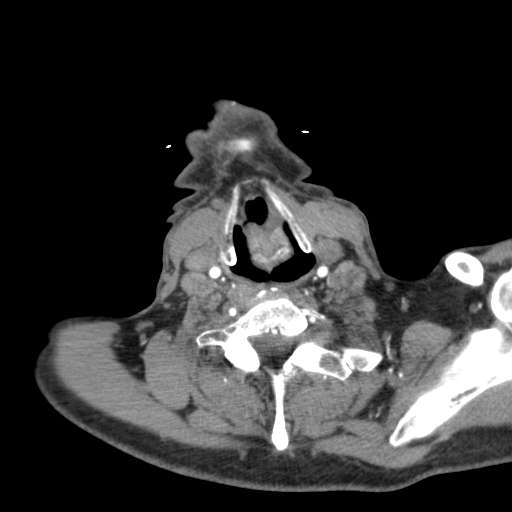
[im 70/122  soft-tissue]
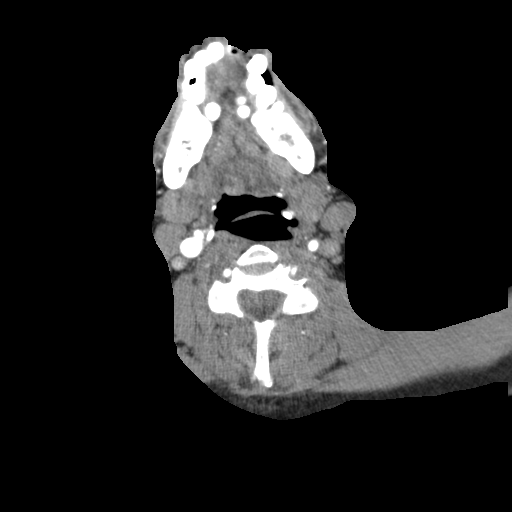
[im 87/122  soft-tissue]
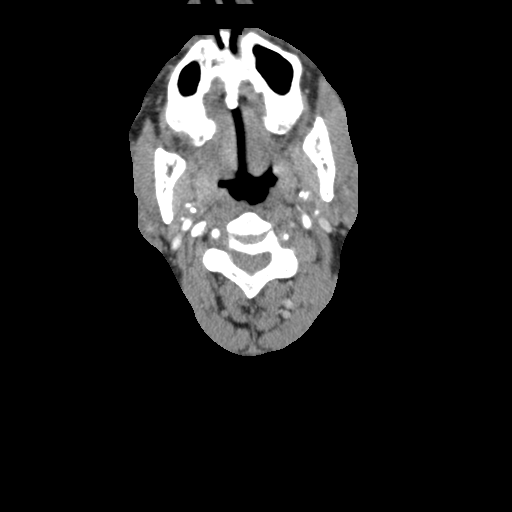
[im 104/122  soft-tissue]
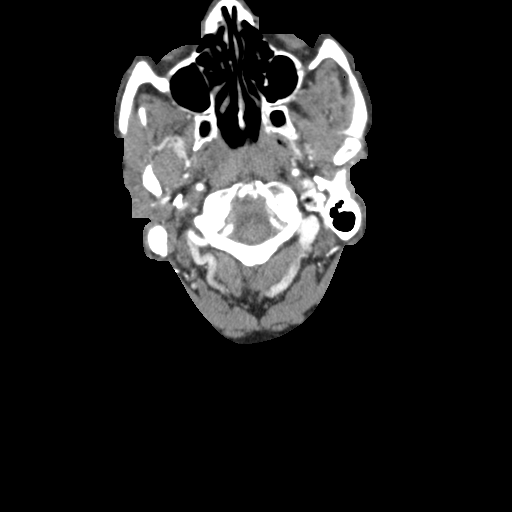

[Series 12: axial thick · axial · 0.33mm/px · z∈[-291,-201]mm · 2 of 62 slices shown, 5 images]
[im 21/62  soft-tissue]
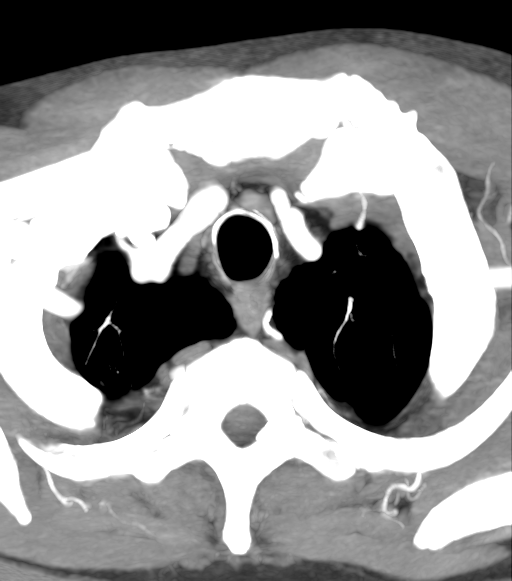
[im 21/62  lung]
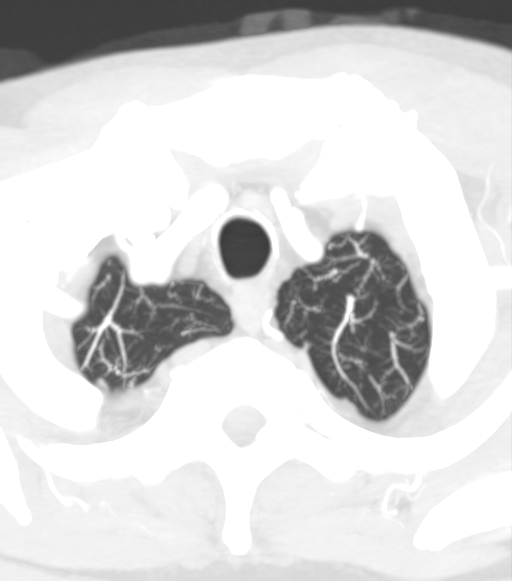
[im 21/62  bone]
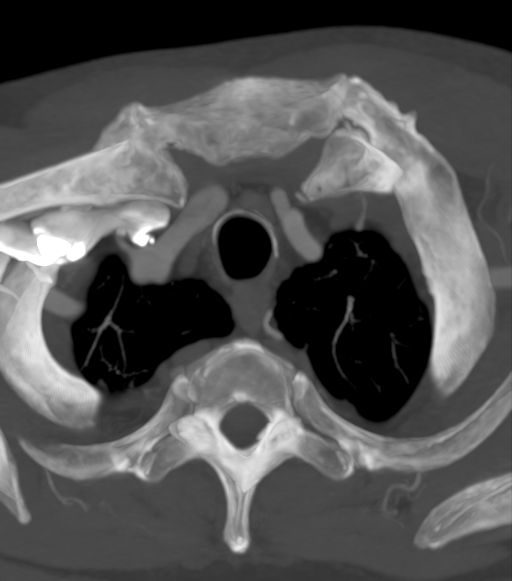
[im 41/62  soft-tissue]
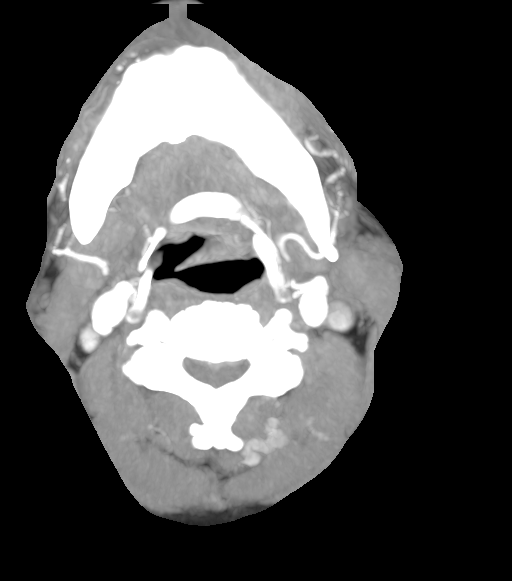
[im 41/62  lung]
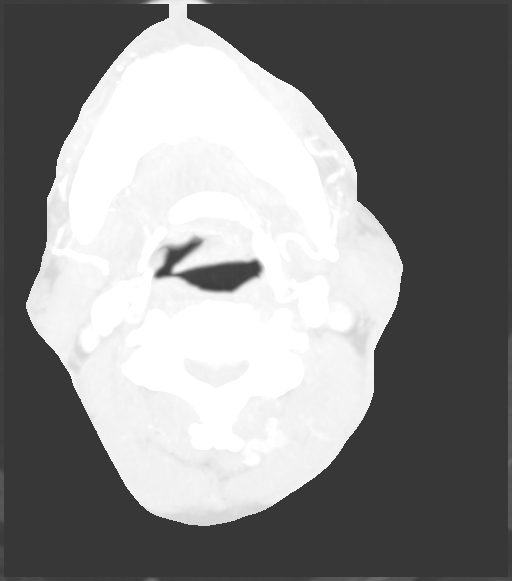

[8 of 32 positions shown; findings below may reference images not displayed]

FINDINGS: CTA NECK FINDINGS

SKELETON: There is no bony spinal canal stenosis. No lytic or
blastic lesion.

OTHER NECK: Normal pharynx, larynx and major salivary glands. No
cervical lymphadenopathy. Unremarkable thyroid gland.

UPPER CHEST: No pneumothorax or pleural effusion. No nodules or
masses.

AORTIC ARCH:

There is calcific atherosclerosis of the aortic arch. There is no
aneurysm, dissection or hemodynamically significant stenosis of the
visualized portion of the aorta. Conventional 3 vessel aortic
branching pattern. The visualized proximal subclavian arteries are
widely patent.

RIGHT CAROTID SYSTEM: Normal without aneurysm, dissection or
stenosis.

LEFT CAROTID SYSTEM: Normal without aneurysm, dissection or
stenosis.

VERTEBRAL ARTERIES: Right dominant configuration. Both origins are
clearly patent. There is no dissection, occlusion or flow-limiting
stenosis to the skull base (V1-V3 segments).
IMPRESSION: 1. No dissection, occlusion or hemodynamically significant stenosis
of the major cervical arteries.
2. The right carotid bulb is normal. Previously seen heterogeneity
is no longer present.

Aortic Atherosclerosis ([ZO]-[ZO]).

## 2020-01-06 MED ORDER — IOPAMIDOL (ISOVUE-370) INJECTION 76%
75.0000 mL | Freq: Once | INTRAVENOUS | Status: AC | PRN
  Administered 2020-01-06: 75 mL via INTRAVENOUS

## 2020-01-07 ENCOUNTER — Other Ambulatory Visit: Payer: Self-pay

## 2020-01-07 ENCOUNTER — Ambulatory Visit: Payer: Medicare Other

## 2020-01-07 ENCOUNTER — Ambulatory Visit: Payer: Medicare Other | Admitting: Occupational Therapy

## 2020-01-07 ENCOUNTER — Encounter: Payer: Self-pay | Admitting: Physical Medicine & Rehabilitation

## 2020-01-07 ENCOUNTER — Encounter: Payer: Medicare Other | Attending: Registered Nurse | Admitting: Physical Medicine & Rehabilitation

## 2020-01-07 VITALS — BP 133/90 | HR 83 | Temp 98.3°F | Ht 70.0 in | Wt 168.0 lb

## 2020-01-07 DIAGNOSIS — Z87891 Personal history of nicotine dependence: Secondary | ICD-10-CM | POA: Diagnosis present

## 2020-01-07 DIAGNOSIS — R269 Unspecified abnormalities of gait and mobility: Secondary | ICD-10-CM

## 2020-01-07 DIAGNOSIS — I69352 Hemiplegia and hemiparesis following cerebral infarction affecting left dominant side: Secondary | ICD-10-CM

## 2020-01-07 DIAGNOSIS — M6281 Muscle weakness (generalized): Secondary | ICD-10-CM

## 2020-01-07 DIAGNOSIS — I6381 Other cerebral infarction due to occlusion or stenosis of small artery: Secondary | ICD-10-CM | POA: Diagnosis not present

## 2020-01-07 DIAGNOSIS — I639 Cerebral infarction, unspecified: Secondary | ICD-10-CM

## 2020-01-07 DIAGNOSIS — R262 Difficulty in walking, not elsewhere classified: Secondary | ICD-10-CM

## 2020-01-07 DIAGNOSIS — R2681 Unsteadiness on feet: Secondary | ICD-10-CM

## 2020-01-07 DIAGNOSIS — R7303 Prediabetes: Secondary | ICD-10-CM | POA: Diagnosis present

## 2020-01-07 DIAGNOSIS — I69354 Hemiplegia and hemiparesis following cerebral infarction affecting left non-dominant side: Secondary | ICD-10-CM

## 2020-01-07 DIAGNOSIS — I10 Essential (primary) hypertension: Secondary | ICD-10-CM | POA: Diagnosis not present

## 2020-01-07 DIAGNOSIS — R41840 Attention and concentration deficit: Secondary | ICD-10-CM

## 2020-01-07 DIAGNOSIS — R278 Other lack of coordination: Secondary | ICD-10-CM

## 2020-01-07 NOTE — Patient Instructions (Signed)
Access Code: [facility] URL: [email or web address] Date: 01/07/2020 Prepared by: Baldomero Lamy  Exercises Seated Long Arc Quad - 1 x daily - 5 x weekly - 2 sets - 10 reps Seated Ankle Dorsiflexion with Resistance - 1 x daily - 5 x weekly - 2 sets - 10 reps Seated Hamstring Curl with Anchored Resistance - 1 x daily - 5 x weekly - 2 sets - 10 reps Supine Active Straight Leg Raise - 1 x daily - 5 x weekly - 2 sets - 10 reps Side Stepping with Counter Support - 1 x daily - 5 x weekly - 2 sets - 10 reps Standing Marching - 1 x daily - 5 x weekly - 2 sets - 10 reps Mini Squat with Counter Support - 1 x daily - 5 x weekly - 2 sets - 10 reps Clamshell with Resistance - 1 x daily - 5 x weekly - 2 sets - 10 reps Seated Heel Raise - 1 x daily - 5 x weekly - 2 sets - 15 reps

## 2020-01-07 NOTE — Therapy (Signed)
[facility] [address] [place], [place], [address] Phone: [phone]   Fax:  [phone]  Physical Therapy Treatment  Patient Details  Name: Russell Russell MRN: [id number] Date of Birth: 04/18/1950 Referring Provider (PT): Daniel Angiulli, PA-C   Encounter Date: 01/07/2020   PT End of Session - 01/07/20 1325    Visit Number 41    Number of Visits 46    Date for PT Re-Evaluation 03/02/20   new POC for 8 weeks, Cert for 90 days   Authorization Type BCBS (follow medicare guidelines, 10th visit PN)    Progress Note Due on Visit 40    PT Start Time 1235    PT Stop Time 1318    PT Time Calculation (min) 43 min    Equipment Utilized During Treatment Gait belt    Activity Tolerance Patient tolerated treatment well    Behavior During Therapy WFL for tasks assessed/performed           Past Medical History:  Diagnosis Date  . Alcohol abuse    drinks 1 gallon of Gin every weekend, nothing during the week x >15 yrs  . Elevated transaminase level    +elev bili: abd u/s 06/2014 showed stable small hepatic hemangiomas, o/w normal.  . Essential hypertension   . GERD (gastroesophageal reflux disease)   . Hyperlipidemia 03/2014   Atorv started-chol improved  . Impaired fasting glucose 03/2014  . Nephrolithiasis   . PUD (peptic ulcer disease)   . Stroke ([facility])   . Tobacco dependence    chantix: "psych effects"    Past Surgical History:  Procedure Laterality Date  . COLONOSCOPY  2005   Recall 10 yrs ([place])  . LOOP RECORDER INSERTION N/A 07/03/2019   Procedure: LOOP RECORDER INSERTION;  Surgeon: Allred, James, MD;  Location: [facility];  Service: Cardiovascular;  Laterality: N/A;  . removal of kidney stones  2006   cystoscopic--removed from ureter.  No prob since.    There were no vitals filed for this visit.   Subjective Assessment - 01/07/20 1240    Subjective Patient reports no falls/changes since  last visit. No other new complaints.    Patient is accompained by: Family member   spouse   Pertinent History PMH: HTN, alcohol abuse (ongoing use), HLD, GERD, PUD, tobacco use    Limitations Standing;Walking;House hold activities    Patient Stated Goals return to prior level of function, walk, "wheelchair is not apart of me" (wants to get out of the w/c)    Currently in Pain? No/denies               [facility] Adult PT Treatment/Exercise - 01/07/20 0001      Transfers   Transfers Sit to Stand;Stand to Sit    Sit to Stand 5: Supervision    Sit to Stand Details Verbal cues for sequencing;Verbal cues for precautions/safety;Verbal cues for safe use of DME/AE    Stand to Sit 5: Supervision    Stand to Sit Details (indicate cue type and reason) Verbal cues for sequencing;Verbal cues for technique;Verbal cues for safe use of DME/AE    Stand Pivot Transfers 5: Supervision    Stand Pivot Transfer Details (indicate cue type and reason) completed tranfer from w/c <> mat with RW x 2. supervisoin throughout with completion.            Completed entire review of current HEP and progressed as tolerated by patient. Completed all of the following exercises   listed below, with progression noted if patient able to tolerate progression. Patient demo all exercises properly during session today with minimal cueing.    Access Code: [facility] URL: [email or web address] Date: 01/07/2020 Prepared by: Baldomero Lamy  Exercises Seated Long Arc Quad - 1 x daily - 5 x weekly - 2 sets - 10 reps - progressed to addition of ankle weights Seated Ankle Dorsiflexion with Resistance - 1 x daily - 5 x weekly - 2 sets - 10 reps  Seated Hamstring Curl with Anchored Resistance - 1 x daily - 5 x weekly - 2 sets - 10 reps - progressed resistance to green band Supine Active Straight Leg Raise - 1 x daily - 5 x weekly - 2 sets - 10 reps Side Stepping with Counter Support - 1 x daily - 5 x weekly - 2 sets - 10  reps Standing Marching - 1 x daily - 5 x weekly - 2 sets - 10 reps - initially seated, progressed to standing Mini Squat with Counter Support - 1 x daily - 5 x weekly - 2 sets - 10 reps Clamshell with Resistance - 1 x daily - 5 x weekly - 2 sets - 10 reps - progressed resistance to red band Seated Heel Raise - 1 x daily - 5 x weekly - 2 sets - 15 reps - progressed to addition of ankle weights        PT Education - 01/07/20 1325    Education Details Educated on Updated HEP    Person(s) Educated Patient;Spouse    Methods Explanation;Demonstration;Handout    Comprehension Verbalized understanding;Returned demonstration;Need further instruction            PT Short Term Goals - 12/31/19 1550      PT SHORT TERM GOAL #1   Title Patient will improve TUG to </= 30 seconds with LRAD to demo reduced fall risk (All STGs Due: 12/31/19)    Baseline 38.87 secs, 35.85 secs    Time 4    Period Weeks    Status Not Met    Target Date 12/31/19      PT SHORT TERM GOAL #2   Title Patient will improve Berg Balance to >/= 35/56 to demonstrate improved balance and reduced fall risk    Baseline 31/56, 32/56    Time 4    Period Weeks    Status Not Met      PT SHORT TERM GOAL #3   Title Patient will improve gait speed to >/= 1.5 ft/sec with LRAD to demo improved mobility    Baseline 1.13 ft/sec, 1.18 ft/sec    Time 4    Period Weeks    Status Not Met             PT Long Term Goals - 12/01/19 8889      PT LONG TERM GOAL #1   Title Patient will be independent with walking program 5x/day week with wife assistance (ALL LTG Due:01/28/2020)    Baseline TBD    Time 8    Period Weeks    Status New    Target Date 01/28/20      PT LONG TERM GOAL #2   Title Patient will demonstrate ability to complete all transfers w/ LRAD at Mod I Level to demonstrate improved independence with functional mobility    Baseline supervision with cues for safety at times, improved from min guard assist    Time  --    Period --    Status Revised  PT LONG TERM GOAL #3   Title Patient will demo ability to ambulate >400 ft on outdoor/unlevel surfaces with CGA to demo improved community mobility    Baseline unable to assess today due to weather    Time 8    Period Weeks    Status Revised      PT LONG TERM GOAL #4   Title Patient will improve TUG to </= 25 seconds to demonstrate improved mobility and reduced fall risk    Baseline 38.87 secs    Time 8    Period Weeks    Status New      PT LONG TERM GOAL #5   Title Patient will improve Berg Balance to >/= 40/56 to demonstrate reduced risk for falls    Baseline 31/56    Time 8    Period Weeks    Status New      Additional Long Term Goals   Additional Long Term Goals Yes      PT LONG TERM GOAL #6   Title patient will improve gait speed to >/= 2.0 ft/sec to demonstrate improved community mobility    Baseline 1.13 ft/sec    Time 8    Period Weeks    Status New                 Plan - 01/07/20 1329    Clinical Impression Statement Today's skilled PT session included complete review of current HEP and progressed as tolerating. Patient completing all exercises well. Completed progression of some exercises in standing position, as well as addition of increased resistance (via theraband or ankle weights) with seated/supine exercises. Will continue to progress toward all goals.    Personal Factors and Comorbidities Comorbidity 3+    Comorbidities HTN, alcohol abuse (ongoing use), HLD, GERD, PUD, tobacco use    Examination-Activity Limitations Bed Mobility;Dressing;Locomotion Level;Stairs;Stand;Toileting;Transfers;Bathing    Examination-Participation Restrictions Driving;Interpersonal Relationship;Yard Work    Stability/Clinical Decision Making Evolving/Moderate complexity    Rehab Potential Good    PT Frequency 2x / week    PT Duration 8 weeks    PT Treatment/Interventions ADLs/Self Care Home Management;Electrical Stimulation;DME  Instruction;Moist Heat;Cryotherapy;Gait training;Stair training;Functional mobility training;Therapeutic activities;Therapeutic exercise;Balance training;Neuromuscular re-education;Patient/family education;Orthotic Fit/Training;Wheelchair mobility training;Passive range of motion    PT Next Visit Plan How is HEP Update? Continue to work on exercises promote L weight shift and LLE strengthening.    Consulted and Agree with Plan of Care Patient;Family member/caregiver    Family Member Consulted Wife           Patient will benefit from skilled therapeutic intervention in order to improve the following deficits and impairments:  Abnormal gait, Decreased coordination, Difficulty walking, Impaired tone, Decreased safety awareness, Decreased endurance, Decreased activity tolerance, Pain, Decreased balance, Decreased knowledge of use of DME, Postural dysfunction, Impaired sensation, Decreased strength, Decreased mobility  Visit Diagnosis: Hemiplegia and hemiparesis following cerebral infarction affecting left dominant side (HCC)  Unsteadiness on feet  Muscle weakness (generalized)  Difficulty in walking, not elsewhere classified     Problem List Patient Active Problem List   Diagnosis Date Noted  . History of tobacco abuse 01/07/2020  . Hemiparesis affecting left side as late effect of stroke ([facility]) 01/07/2020  . Abnormality of gait 09/24/2019  . AKI (acute kidney injury) ([facility])   . Fever   . Sepsis without acute organ dysfunction (H[facility])   . Elevated BUN   . Blood pressure increase diastolic   . Labile blood pressure   . Leukopenia   .   Benign essential HTN   . Hypoalbuminemia   . Cerebrovascular accident (CVA) of right basal ganglia ([facility]) 07/07/2019  . Acute ischemic stroke ([facility])   . Dyslipidemia   . Dysphagia, post-stroke   . Polycythemia   . Stroke ([facility]) 06/30/2019  . Prediabetes 12/23/2016  . Elevated LFTs 12/23/2016  . Pure hypercholesterolemia 12/21/2016  . Alcoholism ([facility])  12/21/2016  . Eczema 09/18/2013  . Essential hypertension 09/18/2013  . Gastroesophageal reflux disease without esophagitis 09/18/2013  . Tobacco abuse 09/18/2013    Kaitlyn B Martin, PT, DPT 01/07/2020, 1:37 PM   [facility] [address] , [place], [address] Phone: [phone]   Fax:  [phone]  Name: Russell Russell MRN: [id number] Date of Birth: 04/29/1950   

## 2020-01-07 NOTE — Progress Notes (Signed)
Subjective:    Patient ID: Russell Russell, male    DOB: September 02, 1950, 69 y.o.   MRN: [id number]   HPI Right-handed male with history of alcohol and tobacco use, hyperlipidemia presents for hospital follow-up after receiving CIR for multiple anterior and posterior right brain infarcts.  Wife supplements history. He was last seen in clinic on 11/26/2019.  Since that time, patient states he continues to be in therapies.  He continues to work on using cane for ambulation.  Denies falls. BP relatively controlled.  Abstaining from smoking/drinking. Denies falls. He has not been checking CBGs.  Pain Inventory Average Pain 0 Pain Right Now 0 My pain is no pain but left leg side weakness.  In the last 24 hours, has pain interfered with the following? General activity 3 Relation with others 0 Enjoyment of life 0 What TIME of day is your pain at its worst? Left side weakness all the time. Sleep (in general) Good  Pain is worse with: inactivity Pain improves with: therapy/exercise Relief from Meds: no pain.   Mobility use a walker ability to climb steps?  yes do you drive?  no use a wheelchair transfers alone  Function retired  Neuro/Psych trouble walking  Prior Studies Any changes since last visit?  yes CT/MRI  Physicians involved in your care Any changes since last visit?  no   Family History  Problem Relation Age of Onset   Colon cancer Mother    Cancer Father    Breast cancer Sister    Breast cancer Sister    Social History   Socioeconomic History   Marital status: Married    Spouse name: Not on file   Number of children: Not on file   Years of education: Not on file   Highest education level: Not on file  Occupational History   Not on file  Tobacco Use   Smoking status: Former Smoker    Packs/day: 0.50    Years: 20.00    Pack years: 10.00    Types: Cigarettes   Smokeless tobacco: Never Used  Scientific laboratory technician Use: Never used  Substance  and Sexual Activity   Alcohol use: Yes    Alcohol/week: 8.0 standard drinks    Types: 8 Cans of beer per week    Comment: Weekends only   Drug use: No   Sexual activity: Not on file  Other Topics Concern   Not on file  Social History Narrative   Married, no children.   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   Tob: 10 pack-yr hx (current as of 03/2014).   Alcohol: 8 beers and pint of whisky on weekends only.   Denies hx of prob with drugs or alcohol.   Social Determinants of Health   Financial Resource Strain:    Difficulty of Paying Living Expenses: Not on file  Food Insecurity:    Worried About Charity fundraiser in the Last Year: Not on file   [employer] of Food in the Last Year: Not on file  Transportation Needs:    Lack of Transportation (Medical): Not on file   Lack of Transportation (Non-Medical): Not on file  Physical Activity:    Days of Exercise per Week: Not on file   Minutes of Exercise per Session: Not on file  Stress:    Feeling of Stress : Not on file  Social Connections:    Frequency of Communication with Friends and Family:  Not on file   Frequency of Social Gatherings with Friends and Family: Not on file   Attends Religious Services: Not on file   Active Member of Clubs or Organizations: Not on file   Attends Club or Organization Meetings: Not on file   Marital Status: Not on file   Past Surgical History:  Procedure Laterality Date   COLONOSCOPY  2005   Recall 10 yrs ([place])   LOOP RECORDER INSERTION N/A 07/03/2019   Procedure: LOOP RECORDER INSERTION;  Surgeon: Thompson Grayer, MD;  Location: [facility];  Service: Cardiovascular;  Laterality: N/A;   removal of kidney stones  2006   cystoscopic--removed from ureter.  No prob since.   Past Medical History:  Diagnosis Date   Alcohol abuse    drinks 1 gallon of Gin every weekend, nothing during the week x >15 yrs   Elevated  transaminase level    +elev bili: abd u/s 06/2014 showed stable small hepatic hemangiomas, o/w normal.   Essential hypertension    GERD (gastroesophageal reflux disease)    Hyperlipidemia 03/2014   Atorv started-chol improved   Impaired fasting glucose 03/2014   Nephrolithiasis    PUD (peptic ulcer disease)    Stroke ([place])    Tobacco dependence    chantix: "psych effects"   BP 133/90    Pulse 83    Temp 98.3 F (36.8 C)    Ht 5\' 10"  (1.778 m)    Wt 168 lb (76.2 kg)    SpO2 97%    BMI 24.11 kg/m   Opioid Risk Score:   Fall Risk Score:  `1  Depression screen PHQ 2/9  Depression screen [facility] 2/9 01/07/2020 08/18/2019 08/17/2019 02/24/2019 12/21/2017 12/21/2016  Decreased Interest 0 0 1 0 0 0  Down, Depressed, Hopeless 0 0 0 0 0 0  PHQ - 2 Score 0 0 1 0 0 0  Altered sleeping - - 0 - - -  Tired, decreased energy - - 0 - - -  Change in appetite - - 0 - - -  Feeling bad or failure about yourself  - - 0 - - -  Trouble concentrating - - 0 - - -  Moving slowly or fidgety/restless - - 0 - - -  Suicidal thoughts - - 0 - - -  PHQ-9 Score - - 1 - - -    Review of Systems  Constitutional: Negative.   HENT: Negative.   Eyes: Negative.   Respiratory: Negative.   Cardiovascular: Negative.   Gastrointestinal: Negative.   Endocrine: Negative.   Genitourinary: Negative.   Musculoskeletal: Positive for gait problem. Negative for arthralgias and back pain.  Skin: Negative.   Allergic/Immunologic: Negative.   Neurological: Positive for weakness. Negative for dizziness, numbness and headaches.  Hematological: Negative.   Psychiatric/Behavioral: Negative.   All other systems reviewed and are negative.      Objective:   Physical Exam  Constitutional: No distress . Vital signs reviewed. HENT: Normocephalic.  Atraumatic. Eyes: EOMI. No discharge. Cardiovascular: No JVD.   Respiratory: Normal effort.  No stridor.   GI: Non-distended.   Skin: Warm and dry.  Intact. Psych: Normal mood.   Normal behavior. Musc: No edema in extremities.  No tenderness in extremities. Neuro: Alert Motor: Left upper extremity: Shoulder abduction 4-4+/5, distally 4+/5, with apraxia and ataxia Left lower extremity: Hip flexion 4-4+/5, knee extension 4 +/5, ankle dorsiflexion 4+/5 Dysarthria,stable Left facial weakness, stable    Assessment & Plan:  Right-handed male with  history of alcohol and tobacco use, hyperlipidemia presents for hospital follow-up after receiving CIR for multiple anterior and posterior right brain infarcts.  1.  Left side hemiparesis secondary to multiple scattered anterior and posterior right brain infarcts.  Status post loop recorder             Continue therapies, notes reviewed- continue to work on use of cane.  Continue follow up with Neurology  Continue to follow up with Cards  D/c wrist orthosis given improved strength  2.  Blood pressure  Relatively controlled today  3.  History of tobacco and alcohol use.    Continue to abstain-educated wife and patient again on risks associated with tobacco abuse previously.  4. Gait abnormality  Continue therapies   Continue walker for safety- continue to attempt to to transition to cane  5. Prediabetes   Discussed and encouraged diabetic diet, reminded again

## 2020-01-07 NOTE — Therapy (Signed)
[facility] [address] [place], [name], [address] Phone: [phone]   Fax:  [phone]  Occupational Therapy Treatment  Patient Details  Name: Russell Russell MRN: [id number] Date of Birth: Nov 08, 1950 Referring Provider (OT): [name]   Encounter Date: 01/07/2020   OT End of Session - 01/07/20 1155    Visit Number 24    Number of Visits 25    Date for OT Re-Evaluation 01/30/20    Authorization Type BCBS? "Follow Medicare Guidelines"    Authorization - Number of Visits 24    Progress Note Due on Visit 28    OT Start Time 1149    OT Stop Time 1230    OT Time Calculation (min) 41 min    Activity Tolerance Patient tolerated treatment well    Behavior During Therapy [facility] for tasks assessed/performed           Past Medical History:  Diagnosis Date  . Alcohol abuse    drinks 1 gallon of Gin every weekend, nothing during the week x >15 yrs  . Elevated transaminase level    +elev bili: abd u/s 06/2014 showed stable small hepatic hemangiomas, o/w normal.  . Essential hypertension   . GERD (gastroesophageal reflux disease)   . Hyperlipidemia 03/2014   Atorv started-chol improved  . Impaired fasting glucose 03/2014  . Nephrolithiasis   . PUD (peptic ulcer disease)   . Stroke ([place])   . Tobacco dependence    chantix: "psych effects"    Past Surgical History:  Procedure Laterality Date  . COLONOSCOPY  2005   Recall 10 yrs ([place])  . LOOP RECORDER INSERTION N/A 07/03/2019   Procedure: LOOP RECORDER INSERTION;  Surgeon: Thompson Grayer, MD;  Location: [facility];  Service: Cardiovascular;  Laterality: N/A;  . removal of kidney stones  2006   cystoscopic--removed from ureter.  No prob since.    There were no vitals filed for this visit.   Subjective Assessment - 01/07/20 1226    Subjective  Denies pain    Pertinent History HTN, ETOH use, smoker, loop recorder    Currently in Pain? No/denies                         Neurological Re-education Exercises  Other Exercises 1 Functional activities to address left  hand/wrist/forearm coordination.  Patient with slower movements in left hand,  Pt performed in hand manipulation, grasp, release, pinch,and functional wrist movements. Pt demonstrates improved LUE functional use, however he requires v.c to initiate use.Reveiwed A/ROM wrist flexion/ extension exercises, min v.c Standing for  LUE midrange functional reach ans pinch with LUE, min v.c             OT Short Term Goals - 01/05/20 1906      OT SHORT TERM GOAL #1   Title Patient will compelte a home exercise program designed to improve functional use of LUE with mod cueing and set up due 09/05/19    Time 4    Period Weeks    Status Achieved    Target Date 09/05/19      OT SHORT TERM GOAL #2   Title Patient will don a front opening shirt with fasteners (buttons/zipper) with mod assist    Time 4    Period Weeks    Status Achieved      OT SHORT TERM GOAL #3   Title Patient will demonstrate ability to cut food on plate with  min assist    Time 4    Period Weeks    Status Achieved      OT SHORT TERM GOAL #4   Title Patient will demonstrate low reach, grasp, release of lightweight small object (less than 1 lb) with min assist    Time 4    Period Weeks    Status Achieved      OT SHORT TERM GOAL #5   Title Patient will ambulate into bathroom and cmplete toileting with supervision and verbal cueing assistance due 11/04/19    Time 4    Period Weeks    Status Achieved      OT SHORT TERM GOAL #6   Title Patient will transport necessary items for bathing and dressing into bathroom to encourage greater independence with showering.    Status Achieved             OT Long Term Goals - 01/05/20 1906      OT LONG TERM GOAL #1   Title Patient will complete an updated HEP to address LUE functioning due 10/05/19    Status Achieved      OT LONG TERM GOAL #2    Title Patient will walk with assistive device into bathroom to use toilet with min assist    Status Achieved      OT LONG TERM GOAL #3   Title Patient will transfer onto shower seat with min assist    Status Achieved      OT LONG TERM GOAL #4   Title Patient will obtain and release a 2-3 lb object at chest height shelf using LUE and min assist    Status Achieved      OT LONG TERM GOAL #5   Title Patient will toilet himself with modified independence due 12/04/19    Time 8    Period Weeks    Status Achieved      OT LONG TERM GOAL #6   Title Patient will shower self with supervision/set up assistance    Time 8    Period Weeks    Status Achieved      OT LONG TERM GOAL #7   Title Patient will grocery shop with wife, and will locate and retrieve at least 5 items from list with only minimal assistance due 01/30/20    Time 8    Period Weeks    Status On-going      OT LONG TERM GOAL #8   Title Patient will demonstrate at least 60* of active wrist extension without report of wrist pain (left)    Time 4    Period Weeks    Status New                 Plan - 01/07/20 1207    Clinical Impression Statement Pt is progressing towards goals. He reports assisting his wife at grocery store on 1 occaision. Therapist encouraged pt/wife to attempt this weekend to allow pt to assist.Anticipate d/c from OT next visit.    OT Occupational Profile and History Detailed Assessment- Review of Records and additional review of physical, cognitive, psychosocial history related to current functional performance    Occupational performance deficits (Please refer to evaluation for details): ADL's;IADL's;Rest and Sleep;Leisure    Body Structure / Function / Physical Skills ADL;Coordination;Endurance;GMC;UE functional use;Sensation;Decreased knowledge of precautions;Balance;Body mechanics;Decreased knowledge of use of DME;Flexibility;IADL;Pain;Vision;Cardiopulmonary status limiting  activity;Dexterity;FMC;Proprioception;Strength;Tone;ROM    Cognitive Skills Attention;Orientation;Thought;Perception;Problem Solve;Safety Awareness;Sequencing;Memory;Learn    Psychosocial Skills Habits;Routines and Behaviors  Rehab Potential Good    Clinical Decision Making Several treatment options, min-mod task modification necessary    Comorbidities Affecting Occupational Performance: May have comorbidities impacting occupational performance    Modification or Assistance to Complete Evaluation  Min-Moderate modification of tasks or assist with assess necessary to complete eval    OT Frequency 2x / week    OT Duration 8 weeks    OT Treatment/Interventions Self-care/ADL training;Electrical Stimulation;Therapeutic exercise;Visual/perceptual remediation/compensation;Patient/family education;Splinting;Neuromuscular education;Moist Heat;Aquatic Therapy;Balance training;Therapeutic activities;Functional Mobility Training;Fluidtherapy;DME and/or AE instruction;Manual Therapy;Cognitive remediation/compensation    Plan check goals and d/c OT next visit, pt/ wife aware    OT Home Exercise Plan coordination HEP    Consulted and Agree with Plan of Care Patient;Family member/caregiver    Family Member Consulted Wife, [name]           Patient will benefit from skilled therapeutic intervention in order to improve the following deficits and impairments:   Body Structure / Function / Physical Skills: ADL, Coordination, Endurance, GMC, UE functional use, Sensation, Decreased knowledge of precautions, Balance, Body mechanics, Decreased knowledge of use of DME, Flexibility, IADL, Pain, Vision, Cardiopulmonary status limiting activity, Dexterity, FMC, Proprioception, Strength, Tone, ROM Cognitive Skills: Attention, Orientation, Thought, Perception, Problem Solve, Safety Awareness, Sequencing, Memory, Learn Psychosocial Skills: Habits, Routines and Behaviors   Visit Diagnosis: Hemiplegia and hemiparesis  following cerebral infarction affecting left dominant side (HCC)  Muscle weakness (generalized)  Other lack of coordination  Attention and concentration deficit    Problem List Patient Active Problem List   Diagnosis Date Noted  . History of tobacco abuse 01/07/2020  . Hemiparesis affecting left side as late effect of stroke ([place]) 01/07/2020  . Abnormality of gait 09/24/2019  . AKI (acute kidney injury) ([place])   . Fever   . Sepsis without acute organ dysfunction ([place])   . Elevated BUN   . Blood pressure increase diastolic   . Labile blood pressure   . Leukopenia   . Benign essential HTN   . Hypoalbuminemia   . Cerebrovascular accident (CVA) of right basal ganglia ([place]) 07/07/2019  . Acute ischemic stroke ([place])   . Dyslipidemia   . Dysphagia, post-stroke   . Polycythemia   . Stroke ([place]) 06/30/2019  . Prediabetes 12/23/2016  . Elevated LFTs 12/23/2016  . Pure hypercholesterolemia 12/21/2016  . Alcoholism ([place]) 12/21/2016  . Eczema 09/18/2013  . Essential hypertension 09/18/2013  . Gastroesophageal reflux disease without esophagitis 09/18/2013  . Tobacco abuse 09/18/2013    Wilberto Console 01/07/2020, 12:31 PM Theone Murdoch, OTR/L [phone] Phone: [phone] 12:53 PM 01/07/20 [facility] [address] [place], [name], [address] Phone: [phone]   Fax:  [phone]  Name: Kenzel Ruesch MRN: [id number] Date of Birth: 11/30/1950

## 2020-01-12 ENCOUNTER — Other Ambulatory Visit: Payer: Self-pay

## 2020-01-12 ENCOUNTER — Ambulatory Visit: Payer: Medicare Other | Admitting: Occupational Therapy

## 2020-01-12 ENCOUNTER — Ambulatory Visit: Payer: Medicare Other | Attending: Physician Assistant

## 2020-01-12 ENCOUNTER — Encounter: Payer: Self-pay | Admitting: Occupational Therapy

## 2020-01-12 DIAGNOSIS — R482 Apraxia: Secondary | ICD-10-CM

## 2020-01-12 DIAGNOSIS — R2689 Other abnormalities of gait and mobility: Secondary | ICD-10-CM | POA: Diagnosis present

## 2020-01-12 DIAGNOSIS — R2681 Unsteadiness on feet: Secondary | ICD-10-CM

## 2020-01-12 DIAGNOSIS — I69352 Hemiplegia and hemiparesis following cerebral infarction affecting left dominant side: Secondary | ICD-10-CM

## 2020-01-12 DIAGNOSIS — R278 Other lack of coordination: Secondary | ICD-10-CM | POA: Diagnosis present

## 2020-01-12 DIAGNOSIS — R208 Other disturbances of skin sensation: Secondary | ICD-10-CM | POA: Diagnosis present

## 2020-01-12 DIAGNOSIS — R41840 Attention and concentration deficit: Secondary | ICD-10-CM | POA: Insufficient documentation

## 2020-01-12 DIAGNOSIS — R262 Difficulty in walking, not elsewhere classified: Secondary | ICD-10-CM | POA: Diagnosis present

## 2020-01-12 DIAGNOSIS — M6281 Muscle weakness (generalized): Secondary | ICD-10-CM

## 2020-01-12 NOTE — Therapy (Addendum)
[facility] [address] [place], [name], [address] Phone: [phone]   Fax:  [phone]  Occupational Therapy Treatment  Patient Details  Name: Russell Russell MRN: [id number] Date of Birth: 19-Oct-1950 Referring Provider (OT): [name]   Encounter Date: 01/12/2020   OT End of Session - 01/12/20 1309    Visit Number 25    Number of Visits 25    Date for OT Re-Evaluation 01/30/20    Authorization Type BCBS? "Follow Medicare Guidelines"    Authorization - Number of Visits 25    Progress Note Due on Visit 28    OT Start Time 1230    OT Stop Time 1300    OT Time Calculation (min) 30 min    Activity Tolerance Patient tolerated treatment well    Behavior During Therapy [facility] for tasks assessed/performed           Past Medical History:  Diagnosis Date  . Alcohol abuse    drinks 1 gallon of Gin every weekend, nothing during the week x >15 yrs  . Elevated transaminase level    +elev bili: abd u/s 06/2014 showed stable small hepatic hemangiomas, o/w normal.  . Essential hypertension   . GERD (gastroesophageal reflux disease)   . Hyperlipidemia 03/2014   Atorv started-chol improved  . Impaired fasting glucose 03/2014  . Nephrolithiasis   . PUD (peptic ulcer disease)   . Stroke ([place])   . Tobacco dependence    chantix: "psych effects"    Past Surgical History:  Procedure Laterality Date  . COLONOSCOPY  2005   Recall 10 yrs ([place])  . LOOP RECORDER INSERTION N/A 07/03/2019   Procedure: LOOP RECORDER INSERTION;  Surgeon: Thompson Grayer, MD;  Location: [facility];  Service: Cardiovascular;  Laterality: N/A;  . removal of kidney stones  2006   cystoscopic--removed from ureter.  No prob since.    There were no vitals filed for this visit.   Subjective Assessment - 01/12/20 1241    Subjective  Patient and wife indicate that after today the wheelchair is being put away.  "It's a crutch"    Patient is  accompanied by: Family member    Pertinent History HTN, ETOH use, smoker, loop recorder    Currently in Pain? No/denies    Pain Score 0-No pain                        OT Treatments/Exercises (OP) - 01/12/20 0001      ADLs   ADL Comments Reviewed remaining goals and plan for discharge      Neurological Re-education Exercises   Other Exercises 1 Patient with reduced grip strength in left hand.  Patient issued red theraputty exercises for grip and pinch.      Other Exercises 2 Worked on continued functional use of non dominant LUE.  Patient with diminished sensation in left hand, and wears wrist brace despite multiple discussions to discontinue use.  Patient with decreased functional use of Left hand.  Patient and wife agreeable to retunr for OT eval to further advance UE functioning in March 2022.                    OT Education - 01/12/20 1307    Education Details Therapy putty    Person(s) Educated Patient;Spouse    Methods Explanation    Comprehension Verbalized understanding;Need further instruction;Verbal cues required;Returned demonstration  OT Short Term Goals - 01/12/20 1310      OT SHORT TERM GOAL #1   Title Patient will compelte a home exercise program designed to improve functional use of LUE with mod cueing and set up due 09/05/19    Time 4    Period Weeks    Status Achieved    Target Date 09/05/19      OT SHORT TERM GOAL #2   Title Patient will don a front opening shirt with fasteners (buttons/zipper) with mod assist    Time 4    Period Weeks    Status Achieved      OT SHORT TERM GOAL #3   Title Patient will demonstrate ability to cut food on plate with min assist    Time 4    Period Weeks    Status Achieved      OT SHORT TERM GOAL #4   Title Patient will demonstrate low reach, grasp, release of lightweight small object (less than 1 lb) with min assist    Time 4    Period Weeks    Status Achieved      OT SHORT TERM GOAL  #5   Title Patient will ambulate into bathroom and cmplete toileting with supervision and verbal cueing assistance due 11/04/19    Time 4    Period Weeks    Status Achieved      OT SHORT TERM GOAL #6   Title Patient will transport necessary items for bathing and dressing into bathroom to encourage greater independence with showering.    Status Achieved             OT Long Term Goals - 01/12/20 1311      OT LONG TERM GOAL #1   Title Patient will complete an updated HEP to address LUE functioning due 10/05/19    Status Achieved      OT LONG TERM GOAL #2   Title Patient will walk with assistive device into bathroom to use toilet with min assist    Status Achieved      OT LONG TERM GOAL #3   Title Patient will transfer onto shower seat with min assist    Status Achieved      OT LONG TERM GOAL #4   Title Patient will obtain and release a 2-3 lb object at chest height shelf using LUE and min assist    Status Achieved      OT LONG TERM GOAL #5   Title Patient will toilet himself with modified independence due 12/04/19    Time 8    Period Weeks    Status Achieved      OT LONG TERM GOAL #6   Title Patient will shower self with supervision/set up assistance    Time 8    Period Weeks    Status Achieved      OT LONG TERM GOAL #7   Title Patient will grocery shop with wife, and will locate and retrieve at least 5 items from list with only minimal assistance due 01/30/20    Time 8    Period Weeks    Status Achieved      OT LONG TERM GOAL #8   Title Patient will demonstrate at least 60* of active wrist extension without report of wrist pain (left)    Time 4    Period Weeks    Status Not Met                 Plan -  01/12/20 1309    Clinical Impression Statement Patient has met current goals and is agreebale to OT discharge    OT Occupational Profile and History Detailed Assessment- Review of Records and additional review of physical, cognitive, psychosocial history  related to current functional performance    Occupational performance deficits (Please refer to evaluation for details): ADL's;IADL's;Rest and Sleep;Leisure    Body Structure / Function / Physical Skills ADL;Coordination;Endurance;GMC;UE functional use;Sensation;Decreased knowledge of precautions;Balance;Body mechanics;Decreased knowledge of use of DME;Flexibility;IADL;Pain;Vision;Cardiopulmonary status limiting activity;Dexterity;FMC;Proprioception;Strength;Tone;ROM    Cognitive Skills Attention;Orientation;Thought;Perception;Problem Solve;Safety Awareness;Sequencing;Memory;Learn    Psychosocial Skills Habits;Routines and Behaviors    Rehab Potential Good    Clinical Decision Making Several treatment options, min-mod task modification necessary    Comorbidities Affecting Occupational Performance: May have comorbidities impacting occupational performance    Modification or Assistance to Complete Evaluation  Min-Moderate modification of tasks or assist with assess necessary to complete eval    OT Frequency 2x / week    OT Duration 8 weeks    OT Treatment/Interventions Self-care/ADL training;Electrical Stimulation;Therapeutic exercise;Visual/perceptual remediation/compensation;Patient/family education;Splinting;Neuromuscular education;Moist Heat;Aquatic Therapy;Balance training;Therapeutic activities;Functional Mobility Training;Fluidtherapy;DME and/or AE instruction;Manual Therapy;Cognitive remediation/compensation    Plan d/c    OT Home Exercise Plan coordination HEP, putty red - grip and pinch    Consulted and Agree with Plan of Care Patient;Family member/caregiver    Family Member Consulted Wife, [name]           Patient will benefit from skilled therapeutic intervention in order to improve the following deficits and impairments:   Body Structure / Function / Physical Skills: ADL, Coordination, Endurance, GMC, UE functional use, Sensation, Decreased knowledge of precautions, Balance, Body  mechanics, Decreased knowledge of use of DME, Flexibility, IADL, Pain, Vision, Cardiopulmonary status limiting activity, Dexterity, FMC, Proprioception, Strength, Tone, ROM Cognitive Skills: Attention, Orientation, Thought, Perception, Problem Solve, Safety Awareness, Sequencing, Memory, Learn Psychosocial Skills: Habits, Routines and Behaviors   Visit Diagnosis: Hemiplegia and hemiparesis following cerebral infarction affecting left dominant side (HCC)  Muscle weakness (generalized)  Other lack of coordination  Attention and concentration deficit  Apraxia  Other disturbances of skin sensation  Unsteadiness on feet    Problem List Patient Active Problem List   Diagnosis Date Noted  . History of tobacco abuse 01/07/2020  . Hemiparesis affecting left side as late effect of stroke ([place]) 01/07/2020  . Abnormality of gait 09/24/2019  . AKI (acute kidney injury) ([place])   . Fever   . Sepsis without acute organ dysfunction ([place])   . Elevated BUN   . Blood pressure increase diastolic   . Labile blood pressure   . Leukopenia   . Benign essential HTN   . Hypoalbuminemia   . Cerebrovascular accident (CVA) of right basal ganglia ([place]) 07/07/2019  . Acute ischemic stroke ([place])   . Dyslipidemia   . Dysphagia, post-stroke   . Polycythemia   . Stroke ([place]) 06/30/2019  . Prediabetes 12/23/2016  . Elevated LFTs 12/23/2016  . Pure hypercholesterolemia 12/21/2016  . Alcoholism ([place]) 12/21/2016  . Eczema 09/18/2013  . Essential hypertension 09/18/2013  . Gastroesophageal reflux disease without esophagitis 09/18/2013  . Tobacco abuse 09/18/2013  OCCUPATIONAL THERAPY DISCHARGE SUMMARY  Visits from Start of Care: 25  Current functional level related to goals / functional outcomes: Improved ADL performance and functional mobility.     Remaining deficits: Left inattention, hemisensory loss   Education / Equipment: HEP - coordination, putty Plan: Patient agrees to discharge.   Patient goals were not met. Patient is being discharged due to  meeting the stated rehab goals.  ?????       Mariah Milling, OTR/L 01/12/2020, 1:12 PM  [facility] [address] [place], [name], [address] Phone: [phone]   Fax:  [phone]  Name: Russell Russell MRN: [id number] Date of Birth: 24-Dec-1950

## 2020-01-12 NOTE — Therapy (Signed)
[facility] [address] [place], [name], [address] Phone: [phone]   Fax:  [phone]  Physical Therapy Treatment  Patient Details  Name: Russell Russell MRN: [id number] Date of Birth: March 12, 1951 Referring Provider (PT): Lauraine Rinne, PA-C   Encounter Date: 01/12/2020   PT End of Session - 01/12/20 1444    Visit Number 42    Number of Visits 13    Date for PT Re-Evaluation 03/02/20   new POC for 8 weeks, Cert for 90 days   Authorization Type BCBS (follow medicare guidelines, 10th visit PN)    Progress Note Due on Visit 40    PT Start Time 1100    PT Stop Time 1145    PT Time Calculation (min) 45 min    Equipment Utilized During Treatment Gait belt    Activity Tolerance Patient tolerated treatment well    Behavior During Therapy WFL for tasks assessed/performed           Past Medical History:  Diagnosis Date  . Alcohol abuse    drinks 1 gallon of Gin every weekend, nothing during the week x >15 yrs  . Elevated transaminase level    +elev bili: abd u/s 06/2014 showed stable small hepatic hemangiomas, o/w normal.  . Essential hypertension   . GERD (gastroesophageal reflux disease)   . Hyperlipidemia 03/2014   Atorv started-chol improved  . Impaired fasting glucose 03/2014  . Nephrolithiasis   . PUD (peptic ulcer disease)   . Stroke ([place])   . Tobacco dependence    chantix: "psych effects"    Past Surgical History:  Procedure Laterality Date  . COLONOSCOPY  2005   Recall 10 yrs ([place])  . LOOP RECORDER INSERTION N/A 07/03/2019   Procedure: LOOP RECORDER INSERTION;  Surgeon: Thompson Grayer, MD;  Location: [facility];  Service: Cardiovascular;  Laterality: N/A;  . removal of kidney stones  2006   cystoscopic--removed from ureter.  No prob since.    There were no vitals filed for this visit.   Subjective Assessment - 01/12/20 1442    Subjective No new changes    Patient is accompained  by: Family member   spouse   Pertinent History PMH: HTN, alcohol abuse (ongoing use), HLD, GERD, PUD, tobacco use    Limitations Standing;Walking;House hold activities    Patient Stated Goals return to prior level of function, walk, "wheelchair is not apart of me" (wants to get out of the w/c)              Resisted walking with black sport cord:no AD - walking fwd: CGA to min A 6 x 10 feet total - walking bwd: mod A 6 x 10 feet total  Gait training: 2 x 115' with no AD and min A, cueing to improve L step length, tactile cues to improve weight shift to R during L swing phase Sit to stand transfers: min cues to make sure he doesn't reach for chair or bed when sideways, pt cued to turn completely until back of his legs are against bed/chair before he reaches for handles/bed surface: 4x no AD                        PT Education - 01/12/20 1516    Education Details Patient and wife educated on not using wheelchair any longer and strictly relying on walker for ambulation. Wife educated on having gait belt on patient at all times and  providing close supervision during his trnasfers and ambulation for safety    Person(s) Educated Patient;Spouse    Methods Explanation    Comprehension Verbalized understanding            PT Short Term Goals - 12/31/19 1550      PT SHORT TERM GOAL #1   Title Patient will improve TUG to </= 30 seconds with LRAD to demo reduced fall risk (All STGs Due: 12/31/19)    Baseline 38.87 secs, 35.85 secs    Time 4    Period Weeks    Status Not Met    Target Date 12/31/19      PT SHORT TERM GOAL #2   Title Patient will improve Berg Balance to >/= 35/56 to demonstrate improved balance and reduced fall risk    Baseline 31/56, 32/56    Time 4    Period Weeks    Status Not Met      PT SHORT TERM GOAL #3   Title Patient will improve gait speed to >/= 1.5 ft/sec with LRAD to demo improved mobility    Baseline 1.13 ft/sec, 1.18 ft/sec    Time  4    Period Weeks    Status Not Met             PT Long Term Goals - 12/01/19 4825      PT LONG TERM GOAL #1   Title Patient will be independent with walking program 5x/day week with wife assistance (ALL LTG Due:01/28/2020)    Baseline TBD    Time 8    Period Weeks    Status New    Target Date 01/28/20      PT LONG TERM GOAL #2   Title Patient will demonstrate ability to complete all transfers w/ LRAD at Mod I Level to demonstrate improved independence with functional mobility    Baseline supervision with cues for safety at times, improved from min guard assist    Time --    Period --    Status Revised      PT LONG TERM GOAL #3   Title Patient will demo ability to ambulate >400 ft on outdoor/unlevel surfaces with CGA to demo improved community mobility    Baseline unable to assess today due to weather    Time 8    Period Weeks    Status Revised      PT LONG TERM GOAL #4   Title Patient will improve TUG to </= 25 seconds to demonstrate improved mobility and reduced fall risk    Baseline 38.87 secs    Time 8    Period Weeks    Status New      PT LONG TERM GOAL #5   Title Patient will improve Berg Balance to >/= 40/56 to demonstrate reduced risk for falls    Baseline 31/56    Time 8    Period Weeks    Status New      Additional Long Term Goals   Additional Long Term Goals Yes      PT LONG TERM GOAL #6   Title patient will improve gait speed to >/= 2.0 ft/sec to demonstrate improved community mobility    Baseline 1.13 ft/sec    Time 8    Period Weeks    Status New                 Plan - 01/12/20 1442    Clinical Impression Statement Today's skilled session was focused on continued  progression of patient's gait to resisted walking and then progrssing to no AD. Patient and family were educated not to use wheelchair but use walker more to improve independence and gait with AD. Patient will require further education on walking with appropriate AD and  improving independernce with functional activities.    Personal Factors and Comorbidities Comorbidity 3+    Comorbidities HTN, alcohol abuse (ongoing use), HLD, GERD, PUD, tobacco use    Examination-Activity Limitations Bed Mobility;Dressing;Locomotion Level;Stairs;Stand;Toileting;Transfers;Bathing    Examination-Participation Restrictions Driving;Interpersonal Relationship;Yard Work    [employer] Evolving/Moderate complexity    Rehab Potential Good    PT Frequency 2x / week    PT Duration 8 weeks    PT Treatment/Interventions ADLs/Self Care Home Management;Electrical Stimulation;DME Instruction;Moist Heat;Cryotherapy;Gait training;Stair training;Functional mobility training;Therapeutic activities;Therapeutic exercise;Balance training;Neuromuscular re-education;Patient/family education;Orthotic Fit/Training;Wheelchair mobility training;Passive range of motion    PT Next Visit Plan How is HEP Update? Continue to work on exercises promote L weight shift and LLE strengthening.    Consulted and Agree with Plan of Care Patient;Family member/caregiver    Family Member Consulted Wife           Patient will benefit from skilled therapeutic intervention in order to improve the following deficits and impairments:  Abnormal gait, Decreased coordination, Difficulty walking, Impaired tone, Decreased safety awareness, Decreased endurance, Decreased activity tolerance, Pain, Decreased balance, Decreased knowledge of use of DME, Postural dysfunction, Impaired sensation, Decreased strength, Decreased mobility  Visit Diagnosis: Hemiplegia and hemiparesis following cerebral infarction affecting left dominant side (HCC)  Muscle weakness (generalized)  Unsteadiness on feet  Difficulty in walking, not elsewhere classified  Other abnormalities of gait and mobility     Problem List Patient Active Problem List   Diagnosis Date Noted  . History of tobacco abuse 01/07/2020  .  Hemiparesis affecting left side as late effect of stroke ([place]) 01/07/2020  . Abnormality of gait 09/24/2019  . AKI (acute kidney injury) ([place])   . Fever   . Sepsis without acute organ dysfunction ([place])   . Elevated BUN   . Blood pressure increase diastolic   . Labile blood pressure   . Leukopenia   . Benign essential HTN   . Hypoalbuminemia   . Cerebrovascular accident (CVA) of right basal ganglia ([place]) 07/07/2019  . Acute ischemic stroke ([place])   . Dyslipidemia   . Dysphagia, post-stroke   . Polycythemia   . Stroke ([place]) 06/30/2019  . Prediabetes 12/23/2016  . Elevated LFTs 12/23/2016  . Pure hypercholesterolemia 12/21/2016  . Alcoholism ([place]) 12/21/2016  . Eczema 09/18/2013  . Essential hypertension 09/18/2013  . Gastroesophageal reflux disease without esophagitis 09/18/2013  . Tobacco abuse 09/18/2013    Kerrie Pleasure, PT 01/12/2020, 3:18 PM  [facility] [address] [place], [name], [address] Phone: [phone]   Fax:  [phone]  Name: Russell Russell MRN: [id number] Date of Birth: [birth year]

## 2020-01-12 NOTE — Patient Instructions (Signed)
Putty-in-Your-Hand    Slowly squeeze putty or a soft rubber ball while breathing normally. Repeat with other hand. Repeat 10  times. Do 1-2 sessions per day.  http://gt2.exer.us/884   Copyright  VHI. All rights reserved.

## 2020-01-14 ENCOUNTER — Encounter: Payer: Self-pay | Admitting: Physical Therapy

## 2020-01-14 ENCOUNTER — Ambulatory Visit: Payer: Medicare Other | Admitting: Physical Therapy

## 2020-01-14 ENCOUNTER — Ambulatory Visit: Payer: Medicare Other | Admitting: Occupational Therapy

## 2020-01-14 ENCOUNTER — Other Ambulatory Visit: Payer: Self-pay

## 2020-01-14 DIAGNOSIS — M6281 Muscle weakness (generalized): Secondary | ICD-10-CM

## 2020-01-14 DIAGNOSIS — I69352 Hemiplegia and hemiparesis following cerebral infarction affecting left dominant side: Secondary | ICD-10-CM | POA: Diagnosis not present

## 2020-01-14 DIAGNOSIS — R2681 Unsteadiness on feet: Secondary | ICD-10-CM

## 2020-01-14 DIAGNOSIS — R2689 Other abnormalities of gait and mobility: Secondary | ICD-10-CM

## 2020-01-14 DIAGNOSIS — R262 Difficulty in walking, not elsewhere classified: Secondary | ICD-10-CM

## 2020-01-15 NOTE — Therapy (Signed)
[facility] [address] [place], [name], [address] Phone: [phone]   Fax:  [phone]  Physical Therapy Treatment  Patient Details  Name: Russell Russell MRN: [id number] Date of Birth: [birth year] Referring Provider (PT): Lauraine Rinne, PA-C   Encounter Date: 01/14/2020   PT End of Session - 01/14/20 1023    Visit Number 43    Number of Visits 46    Date for PT Re-Evaluation 03/02/20   new POC for 8 weeks, Cert for 90 days   Authorization Type BCBS (follow medicare guidelines, 10th visit PN)    Progress Note Due on Visit 73    PT Start Time 1018    PT Stop Time 1100    PT Time Calculation (min) 42 min    Equipment Utilized During Treatment Gait belt    Activity Tolerance Patient tolerated treatment well    Behavior During Therapy WFL for tasks assessed/performed           Past Medical History:  Diagnosis Date  . Alcohol abuse    drinks 1 gallon of Gin every weekend, nothing during the week x >15 yrs  . Elevated transaminase level    +elev bili: abd u/s 06/2014 showed stable small hepatic hemangiomas, o/w normal.  . Essential hypertension   . GERD (gastroesophageal reflux disease)   . Hyperlipidemia 03/2014   Atorv started-chol improved  . Impaired fasting glucose 03/2014  . Nephrolithiasis   . PUD (peptic ulcer disease)   . Stroke ([place])   . Tobacco dependence    chantix: "psych effects"    Past Surgical History:  Procedure Laterality Date  . COLONOSCOPY  2005   Recall 10 yrs ([place])  . LOOP RECORDER INSERTION N/A 07/03/2019   Procedure: LOOP RECORDER INSERTION;  Surgeon: Thompson Grayer, MD;  Location: [facility];  Service: Cardiovascular;  Laterality: N/A;  . removal of kidney stones  2006   cystoscopic--removed from ureter.  No prob since.    There were no vitals filed for this visit.   Subjective Assessment - 01/14/20 1023    Subjective No new complaints. No falls or pain to  report.    Patient is accompained by: Family member   spouse   Pertinent History PMH: HTN, alcohol abuse (ongoing use), HLD, GERD, PUD, tobacco use    Limitations Standing;Walking;House hold activities    Patient Stated Goals return to prior level of function, walk, "wheelchair is not apart of me" (wants to get out of the w/c)    Currently in Pain? No/denies    Pain Score 0-No pain                 [facility] Adult PT Treatment/Exercise - 01/14/20 1025      Transfers   Transfers Sit to Stand;Stand to Sit    Sit to Stand 5: Supervision    Stand to Sit 5: Supervision      Ambulation/Gait   Ambulation/Gait Yes    Ambulation/Gait Assistance 4: Min guard;4: Min assist    Ambulation/Gait Assistance Details 1st lap with pt's hands on PTA shoulders with cue to "step to my foot" to work on increased step length. assist/faciitation for balance, weight shifting and step length. Next 2 laps with RW: 2cd lap with PTA facilitating RW continuous motion to work toward larger steps with reciprocal pattern, then 3rd lap PTA holding clip board with bright paper under pt's head so he could not visualize feet to promote forward gaze vs looking  down, with continued cues needed for increased bil step length. Then performed a 4th lap with PTA facilitating continuous RW movement for increased reciprocal stepping while rehab tech help clipboard under pt's chin to encourage forward gaze.   Ambulation Distance (Feet) 115 Feet   x 4   Assistive device Rolling walker;1 person hand held assist    Gait Pattern Step-through pattern;Step-to pattern;Decreased stride length;Decreased stance time - right;Decreased step length - left    Ambulation Surface Level;Indoor    Pre-Gait Activities use of floor ladder to work on larger steps with RW x 2 laps after 1st lap and before the last 3 laps with cues needed on stepping one foot inside each square, min guard to min assist.      Neuro Re-ed    Neuro Re-ed Details  for  balance/muscle re-ed: with RW fwd/bwd stepping over bolster for 10 reps each side working on increased step length/height with min guard to min assist for balance; then seated in staggered stance with right foot forward/left foot backwards for sit<>stands for 10 reps. min guard assist with cues for tall standing and slow, controlled descent with sitting down.                     PT Short Term Goals - 12/31/19 1550      PT SHORT TERM GOAL #1   Title Patient will improve TUG to </= 30 seconds with LRAD to demo reduced fall risk (All STGs Due: 12/31/19)    Baseline 38.87 secs, 35.85 secs    Time 4    Period Weeks    Status Not Met    Target Date 12/31/19      PT SHORT TERM GOAL #2   Title Patient will improve Berg Balance to >/= 35/56 to demonstrate improved balance and reduced fall risk    Baseline 31/56, 32/56    Time 4    Period Weeks    Status Not Met      PT SHORT TERM GOAL #3   Title Patient will improve gait speed to >/= 1.5 ft/sec with LRAD to demo improved mobility    Baseline 1.13 ft/sec, 1.18 ft/sec    Time 4    Period Weeks    Status Not Met             PT Long Term Goals - 12/01/19 2542      PT LONG TERM GOAL #1   Title Patient will be independent with walking program 5x/day week with wife assistance (ALL LTG Due:01/28/2020)    Baseline TBD    Time 8    Period Weeks    Status New    Target Date 01/28/20      PT LONG TERM GOAL #2   Title Patient will demonstrate ability to complete all transfers w/ LRAD at Mod I Level to demonstrate improved independence with functional mobility    Baseline supervision with cues for safety at times, improved from min guard assist    Time --    Period --    Status Revised      PT LONG TERM GOAL #3   Title Patient will demo ability to ambulate >400 ft on outdoor/unlevel surfaces with CGA to demo improved community mobility    Baseline unable to assess today due to weather    Time 8    Period Weeks    Status  Revised      PT LONG TERM GOAL #4   Title Patient  will improve TUG to </= 25 seconds to demonstrate improved mobility and reduced fall risk    Baseline 38.87 secs    Time 8    Period Weeks    Status New      PT LONG TERM GOAL #5   Title Patient will improve Berg Balance to >/= 40/56 to demonstrate reduced risk for falls    Baseline 31/56    Time 8    Period Weeks    Status New      Additional Long Term Goals   Additional Long Term Goals Yes      PT LONG TERM GOAL #6   Title patient will improve gait speed to >/= 2.0 ft/sec to demonstrate improved community mobility    Baseline 1.13 ft/sec    Time 8    Period Weeks    Status New                 Plan - 01/14/20 1024    Clinical Impression Statement Today's skilled session continued to focus on gait mechanics with emphasis on forward gaze with increased step length and on LE strengthening. Rest breaks taken as needed due to fatigue after each gait lap. No other issues noted or reported in session. The pt is progressing toward goals and should benefit from continued PT to progress toward unmet goals.    Personal Factors and Comorbidities Comorbidity 3+    Comorbidities HTN, alcohol abuse (ongoing use), HLD, GERD, PUD, tobacco use    Examination-Activity Limitations Bed Mobility;Dressing;Locomotion Level;Stairs;Stand;Toileting;Transfers;Bathing    Examination-Participation Restrictions Driving;Interpersonal Relationship;Yard Work    Merchant [employer] officer Evolving/Moderate complexity    Rehab Potential Good    PT Frequency 2x / week    PT Duration 8 weeks    PT Treatment/Interventions ADLs/Self Care Home Management;Electrical Stimulation;DME Instruction;Moist Heat;Cryotherapy;Gait training;Stair training;Functional mobility training;Therapeutic activities;Therapeutic exercise;Balance training;Neuromuscular re-education;Patient/family education;Orthotic Fit/Training;Wheelchair mobility training;Passive range of  motion    PT Next Visit Plan Continue to work on exercises promote L weight shift and LLE strengthening, gait with RW with empahsis on forward gaze, increased step length and object negotation (around and over)    Consulted and Agree with Plan of Care Patient;Family member/caregiver    Family Member Consulted Wife           Patient will benefit from skilled therapeutic intervention in order to improve the following deficits and impairments:  Abnormal gait, Decreased coordination, Difficulty walking, Impaired tone, Decreased safety awareness, Decreased endurance, Decreased activity tolerance, Pain, Decreased balance, Decreased knowledge of use of DME, Postural dysfunction, Impaired sensation, Decreased strength, Decreased mobility  Visit Diagnosis: Hemiplegia and hemiparesis following cerebral infarction affecting left dominant side (HCC)  Muscle weakness (generalized)  Unsteadiness on feet  Difficulty in walking, not elsewhere classified  Other abnormalities of gait and mobility     Problem List Patient Active Problem List   Diagnosis Date Noted  . History of tobacco abuse 01/07/2020  . Hemiparesis affecting left side as late effect of stroke ([facility]) 01/07/2020  . Abnormality of gait 09/24/2019  . AKI (acute kidney injury) ([place])   . Fever   . Sepsis without acute organ dysfunction ([place])   . Elevated BUN   . Blood pressure increase diastolic   . Labile blood pressure   . Leukopenia   . Benign essential HTN   . Hypoalbuminemia   . Cerebrovascular accident (CVA) of right basal ganglia ([facility]) 07/07/2019  . Acute ischemic stroke ([facility])   . Dyslipidemia   . Dysphagia, post-stroke   .  Polycythemia   . Stroke ([facility]) 06/30/2019  . Prediabetes 12/23/2016  . Elevated LFTs 12/23/2016  . Pure hypercholesterolemia 12/21/2016  . Alcoholism ([place]) 12/21/2016  . Eczema 09/18/2013  . Essential hypertension 09/18/2013  . Gastroesophageal reflux disease without esophagitis 09/18/2013  .  Tobacco abuse 09/18/2013    [name], PTA, [facility] [address] [place], [place] [address] [phone] 01/15/20, 9:09 AM   Name: Russell Russell MRN: [id number] Date of Birth: Jan 09, 1951

## 2020-01-17 LAB — CUP PACEART REMOTE DEVICE CHECK
Date Time Interrogation Session: 20211106230207
Implantable Pulse Generator Implant Date: 20210423

## 2020-01-18 ENCOUNTER — Telehealth: Payer: Self-pay | Admitting: Physician Assistant

## 2020-01-18 ENCOUNTER — Telehealth: Payer: Self-pay | Admitting: Internal Medicine

## 2020-01-18 ENCOUNTER — Ambulatory Visit (INDEPENDENT_AMBULATORY_CARE_PROVIDER_SITE_OTHER): Payer: Medicare Other

## 2020-01-18 ENCOUNTER — Telehealth: Payer: Self-pay

## 2020-01-18 DIAGNOSIS — I6340 Cerebral infarction due to embolism of unspecified cerebral artery: Secondary | ICD-10-CM

## 2020-01-18 MED ORDER — RIVAROXABAN 20 MG PO TABS: 20.0000 mg | ORAL_TABLET | Freq: Every day | ORAL | 3 refills | Status: DC

## 2020-01-18 MED ORDER — APIXABAN 5 MG PO TABS: 5.0000 mg | ORAL_TABLET | Freq: Two times a day (BID) | ORAL | 3 refills | Status: DC

## 2020-01-18 NOTE — Telephone Encounter (Signed)
Pt c/o medication issue:  1. Name of Medication: rivaroxaban (XARELTO) 20 MG TABS tablet  2. How are you currently taking this medication (dosage and times per day)? Has not started  3. Are you having a reaction (difficulty breathing--STAT)? no  4. What is your medication issue? Patient's wife states the medication is $436 and would like to know if there is a way to get the medication for cheaper.

## 2020-01-18 NOTE — Telephone Encounter (Signed)
Carelink alert- LINQ alert for AF episode, duration 1 hour, 16 minutes. EGM consistent with true AF.  Reviewed with Dr. Rayann Heman  AF confirmed, pt to stop ASA325mg  and start Eliquis 5mg  BID.  F/U in clinic with Renee, PA next week.    Spoke with pt wife Russell Russell, [name] on file.  She v/u of instructions and appt scheduled for 11/17 with RU.  Advised to pick up Eliquis today and if cost of medication is too much to call office back.

## 2020-01-18 NOTE — Addendum Note (Signed)
Addended by: Trena Platt E on: 01/18/2020 02:24 PM   Modules accepted: Orders

## 2020-01-18 NOTE — Telephone Encounter (Signed)
Notification received from pharmacy that Eliquis is not covered by pt insurance, Xarelto is on formulary.  Pt CrCL based on 5/25 labs = 75.  Per Dr. Rayann Heman rx sent for Xarelto 20mg  daily.  Spoke with pt wife and advised of change and that Xarelto is only taken daily with evening meal.

## 2020-01-18 NOTE — Telephone Encounter (Signed)
Encounter not needed nurse asnwer

## 2020-01-19 ENCOUNTER — Ambulatory Visit: Payer: Medicare Other

## 2020-01-19 ENCOUNTER — Ambulatory Visit: Payer: Medicare Other | Admitting: Occupational Therapy

## 2020-01-19 ENCOUNTER — Other Ambulatory Visit: Payer: Self-pay

## 2020-01-19 DIAGNOSIS — I69352 Hemiplegia and hemiparesis following cerebral infarction affecting left dominant side: Secondary | ICD-10-CM

## 2020-01-19 DIAGNOSIS — M6281 Muscle weakness (generalized): Secondary | ICD-10-CM

## 2020-01-19 DIAGNOSIS — R262 Difficulty in walking, not elsewhere classified: Secondary | ICD-10-CM

## 2020-01-19 DIAGNOSIS — R2681 Unsteadiness on feet: Secondary | ICD-10-CM

## 2020-01-19 DIAGNOSIS — R2689 Other abnormalities of gait and mobility: Secondary | ICD-10-CM

## 2020-01-19 NOTE — Therapy (Signed)
[name], [name], [address] Phone: [phone]   Fax:  [phone]  Physical Therapy Treatment  Patient Details  Name: Russell Russell MRN: [id number] Date of Birth: 03-01-1951 Referring Provider (PT): Lauraine Rinne, PA-C   Encounter Date: 01/19/2020   PT End of Session - 01/19/20 1152    Visit Number 44    Number of Visits 46    Date for PT Re-Evaluation 03/02/20   new POC for 8 weeks, Cert for 90 days   Authorization Type BCBS (follow medicare guidelines, 10th visit PN)    Progress Note Due on Visit 29    PT Start Time 1147    PT Stop Time 1230    PT Time Calculation (min) 43 min    Equipment Utilized During Treatment Gait belt    Activity Tolerance Patient tolerated treatment well    Behavior During Therapy WFL for tasks assessed/performed           Past Medical History:  Diagnosis Date  . Alcohol abuse    drinks 1 gallon of Gin every weekend, nothing during the week x >15 yrs  . Elevated transaminase level    +elev bili: abd u/s 06/2014 showed stable small hepatic hemangiomas, o/w normal.  . Essential hypertension   . GERD (gastroesophageal reflux disease)   . Hyperlipidemia 03/2014   Atorv started-chol improved  . Impaired fasting glucose 03/2014  . Nephrolithiasis   . PUD (peptic ulcer disease)   . Stroke ([facility])   . Tobacco dependence    chantix: "psych effects"    Past Surgical History:  Procedure Laterality Date  . COLONOSCOPY  2005   Recall 10 yrs ([place] [facility])  . LOOP RECORDER INSERTION N/A 07/03/2019   Procedure: LOOP RECORDER INSERTION;  Surgeon: Thompson Grayer, MD;  Location: [facility];  Service: Cardiovascular;  Laterality: N/A;  . removal of kidney stones  2006   cystoscopic--removed from ureter.  No prob since.    There were no vitals filed for this visit.   Subjective Assessment - 01/19/20 1151    Subjective Patient reports no new changes/complaints.  No falls to report. No pain. Patient reports "feeling lazy" and did not feel like walking in. Patient had episode of AF last week on loop recorder. Wife reports that they are trying to make adjustments to medications.    Patient is accompained by: Family member   spouse   Pertinent History PMH: HTN, alcohol abuse (ongoing use), HLD, GERD, PUD, tobacco use    Limitations Standing;Walking;House hold activities    Patient Stated Goals return to prior level of function, walk, "wheelchair is not apart of me" (wants to get out of the w/c)    Currently in Pain? No/denies                  [facility] Adult PT Treatment/Exercise - 01/19/20 1224      Transfers   Transfers Sit to Stand;Stand to Sit    Sit to Stand 5: Supervision    Stand to Sit 5: Supervision    Comments from w/c completed sit <> stands with feet level and without UE support x 10 reps. Progressed to completing with staggered stance with LLE posterior 2 x 5 reps without UE support, increased challenge noted with this. PT providing blocking behind RLE as patient wanted to scoot it back during completion. Supervision throughout.       Ambulation/Gait   Ambulation/Gait Yes    Ambulation/Gait Assistance  4: Min guard    Ambulation/Gait Assistance Details completed ambulation x 115 ft with RW with PT using wood block to block patient from seeing feet to promote continued forward gaze, PT also providing verbal cues to read signs as ambulating around clinic to promote improved visual scanning and with ambulation. in // bars with PT providing manual faciliation at pelvis completed ambulation without AD, 10' x 6 reps. PT providing verbal cues for improved step length and using mirror as feedback to promote forward gaze    Ambulation Distance (Feet) 115 Feet   x1, 10 x 6    Assistive device Rolling walker    Gait Pattern Step-through pattern;Step-to pattern;Decreased stride length;Decreased stance time - right;Decreased step length - left     Ambulation Surface Level;Indoor      High Level Balance   High Level Balance Activities Negotitating around obstacles;Negotiating over obstacles    High Level Balance Comments With RW, compelted obstacle course including negotiating around cones, as well as stepping over pebbles. Completed 45' x 4 reps, increased verbal cues required for patient to stay aligned within RW to promote improved safety. intermittent CGA througout.       Neuro Re-ed    Neuro Re-ed Details  in // bars: with agility ladder compeleted working towards reciprocal stepping wiht ladder, patient completed x 2 laps with BUE support then progressed to single UE support x 3 laps. PT continue to provide verbal cues for improved step length. Progressed to completing step over pebbles with toe tap prior to stepping over to promote improved weight shift to LLE. CGA required and UE support from // bars.                     PT Short Term Goals - 12/31/19 1550      PT SHORT TERM GOAL #1   Title Patient will improve TUG to </= 30 seconds with LRAD to demo reduced fall risk (All STGs Due: 12/31/19)    Baseline 38.87 secs, 35.85 secs    Time 4    Period Weeks    Status Not Met    Target Date 12/31/19      PT SHORT TERM GOAL #2   Title Patient will improve Berg Balance to >/= 35/56 to demonstrate improved balance and reduced fall risk    Baseline 31/56, 32/56    Time 4    Period Weeks    Status Not Met      PT SHORT TERM GOAL #3   Title Patient will improve gait speed to >/= 1.5 ft/sec with LRAD to demo improved mobility    Baseline 1.13 ft/sec, 1.18 ft/sec    Time 4    Period Weeks    Status Not Met             PT Long Term Goals - 12/01/19 8182      PT LONG TERM GOAL #1   Title Patient will be independent with walking program 5x/day week with wife assistance (ALL LTG Due:01/28/2020)    Baseline TBD    Time 8    Period Weeks    Status New    Target Date 01/28/20      PT LONG TERM GOAL #2   Title  Patient will demonstrate ability to complete all transfers w/ LRAD at Mod I Level to demonstrate improved independence with functional mobility    Baseline supervision with cues for safety at times, improved from min guard assist    Time --  Period --    Status Revised      PT LONG TERM GOAL #3   Title Patient will demo ability to ambulate >400 ft on outdoor/unlevel surfaces with CGA to demo improved community mobility    Baseline unable to assess today due to weather    Time 8    Period Weeks    Status Revised      PT LONG TERM GOAL #4   Title Patient will improve TUG to </= 25 seconds to demonstrate improved mobility and reduced fall risk    Baseline 38.87 secs    Time 8    Period Weeks    Status New      PT LONG TERM GOAL #5   Title Patient will improve Berg Balance to >/= 40/56 to demonstrate reduced risk for falls    Baseline 31/56    Time 8    Period Weeks    Status New      Additional Long Term Goals   Additional Long Term Goals Yes      PT LONG TERM GOAL #6   Title patient will improve gait speed to >/= 2.0 ft/sec to demonstrate improved community mobility    Baseline 1.13 ft/sec    Time 8    Period Weeks    Status New                 Plan - 01/19/20 1347    Clinical Impression Statement Continued activites promoting improved step length and reciprocal stepping for improved carryover with ambulation. Able to progress activites by reducing UE support throughout session. Intermittent rest breaks required. Will continue to progress toward all goals.    Personal Factors and Comorbidities Comorbidity 3+    Comorbidities HTN, alcohol abuse (ongoing use), HLD, GERD, PUD, tobacco use    Examination-Activity Limitations Bed Mobility;Dressing;Locomotion Level;Stairs;Stand;Toileting;Transfers;Bathing    Examination-Participation Restrictions Driving;Interpersonal Relationship;Yard Work    Merchant [employer] officer Evolving/Moderate complexity    Rehab  Potential Good    PT Frequency 2x / week    PT Duration 8 weeks    PT Treatment/Interventions ADLs/Self Care Home Management;Electrical Stimulation;DME Instruction;Moist Heat;Cryotherapy;Gait training;Stair training;Functional mobility training;Therapeutic activities;Therapeutic exercise;Balance training;Neuromuscular re-education;Patient/family education;Orthotic Fit/Training;Wheelchair mobility training;Passive range of motion    PT Next Visit Plan Can begin to check goals. Will be taking break from PT to allow for complaince/carryover at home. Planned D/C. Continue to work on exercises promote L weight shift and LLE strengthening. Gait with RW with empahsis on forward gaze, increased step length and object negotation (around and over)    Consulted and Agree with Plan of Care Patient;Family member/caregiver    Family Member Consulted Wife           Patient will benefit from skilled therapeutic intervention in order to improve the following deficits and impairments:  Abnormal gait, Decreased coordination, Difficulty walking, Impaired tone, Decreased safety awareness, Decreased endurance, Decreased activity tolerance, Pain, Decreased balance, Decreased knowledge of use of DME, Postural dysfunction, Impaired sensation, Decreased strength, Decreased mobility  Visit Diagnosis: Hemiplegia and hemiparesis following cerebral infarction affecting left dominant side (HCC)  Unsteadiness on feet  Difficulty in walking, not elsewhere classified  Muscle weakness (generalized)  Other abnormalities of gait and mobility     Problem List Patient Active Problem List   Diagnosis Date Noted  . History of tobacco abuse 01/07/2020  . Hemiparesis affecting left side as late effect of stroke ([place]) 01/07/2020  . Abnormality of gait 09/24/2019  . AKI (acute kidney injury) ([place] [place])   .  Fever   . Sepsis without acute organ dysfunction ([place])   . Elevated BUN   . Blood pressure increase diastolic   . Labile  blood pressure   . Leukopenia   . Benign essential HTN   . Hypoalbuminemia   . Cerebrovascular accident (CVA) of right basal ganglia ([place]) 07/07/2019  . Acute ischemic stroke ([place])   . Dyslipidemia   . Dysphagia, post-stroke   . Polycythemia   . Stroke ([facility]) 06/30/2019  . Prediabetes 12/23/2016  . Elevated LFTs 12/23/2016  . Pure hypercholesterolemia 12/21/2016  . Alcoholism ([place]) 12/21/2016  . Eczema 09/18/2013  . Essential hypertension 09/18/2013  . Gastroesophageal reflux disease without esophagitis 09/18/2013  . Tobacco abuse 09/18/2013    Jones Bales, PT, DPT 01/19/2020, 1:51 PM  [facility] [address] [place], [name], [address] Phone: [phone]   Fax:  [phone]  Name: Russell Russell MRN: [id number] Date of Birth: 1950-04-07

## 2020-01-19 NOTE — Progress Notes (Signed)
[facility]link Summary Report / Loop Recorder 

## 2020-01-19 NOTE — Telephone Encounter (Addendum)
**Note De-Identified Russell Russell Obfuscation** The pts wife and DPR [name] states that the pts cost for 30 days of Xarelto is $436. She is concerned because it is close to the end of the year so they will have to pay the high deductible cost now and then again in 03/2020.  She is advised that we are leaving him a 30 day co-pay card and 2 bottles of samples in the front office at Dr Bonita Quin office on [address] in [place] for them to pick up.  We also discussed: The pt applying for pt asst through [facility] and [facility] and that if he is approved he will receive his Xarelto free of charge.  Calling [employer] to apply over the phone (No restrictions) and the pts cost will be $85 per month for his Xarelto.  [name] expressed much appreciation for our help.  Per her request I did leave a message on her VM advising of phone number's to contact [employer] [facility] [employer]  and [employer] as she was at rehab with the pt and could not write anything down at the time.

## 2020-01-21 ENCOUNTER — Other Ambulatory Visit: Payer: Self-pay

## 2020-01-21 ENCOUNTER — Ambulatory Visit: Payer: Medicare Other | Admitting: Physical Therapy

## 2020-01-21 ENCOUNTER — Encounter: Payer: Self-pay | Admitting: Physical Therapy

## 2020-01-21 ENCOUNTER — Encounter: Payer: BC Managed Care – PPO | Admitting: Occupational Therapy

## 2020-01-21 DIAGNOSIS — M6281 Muscle weakness (generalized): Secondary | ICD-10-CM

## 2020-01-21 DIAGNOSIS — R262 Difficulty in walking, not elsewhere classified: Secondary | ICD-10-CM

## 2020-01-21 DIAGNOSIS — R2681 Unsteadiness on feet: Secondary | ICD-10-CM

## 2020-01-21 DIAGNOSIS — I69352 Hemiplegia and hemiparesis following cerebral infarction affecting left dominant side: Secondary | ICD-10-CM | POA: Diagnosis not present

## 2020-01-21 NOTE — Progress Notes (Signed)
Kindly inform the patient that CT angiogram of the neck appears normal.  The previously seen spot on the right carotid is no longer seen.

## 2020-01-22 NOTE — Therapy (Signed)
[facility] [address] [place], [name], [address] Phone: [phone]   Fax:  [phone]  Physical Therapy Treatment  Patient Details  Name: Russell Russell MRN: [id number] Date of Birth: [birth year] Referring Provider (PT): Lauraine Rinne, PA-C   Encounter Date: 01/21/2020   PT End of Session - 01/21/20 1413    Visit Number 45    Number of Visits 46    Date for PT Re-Evaluation 03/02/20   new POC for 8 weeks, Cert for 90 days   Authorization Type [employer] (follow medicare guidelines, 10th visit PN)    Progress Note Due on Visit 60    PT Start Time 1406    PT Stop Time 1445    PT Time Calculation (min) 39 min    Equipment Utilized During Treatment Gait belt    Activity Tolerance Patient tolerated treatment well    Behavior During Therapy WFL for tasks assessed/performed           Past Medical History:  Diagnosis Date  . Alcohol abuse    drinks 1 gallon of Gin every weekend, nothing during the week x >15 yrs  . Elevated transaminase level    +elev bili: abd u/s 06/2014 showed stable small hepatic hemangiomas, o/w normal.  . Essential hypertension   . GERD (gastroesophageal reflux disease)   . Hyperlipidemia 03/2014   Atorv started-chol improved  . Impaired fasting glucose 03/2014  . Nephrolithiasis   . PUD (peptic ulcer disease)   . Stroke ([place])   . Tobacco dependence    chantix: "psych effects"    Past Surgical History:  Procedure Laterality Date  . COLONOSCOPY  2005   Recall 10 yrs ([place])  . LOOP RECORDER INSERTION N/A 07/03/2019   Procedure: LOOP RECORDER INSERTION;  Surgeon: Thompson Grayer, MD;  Location: [facility];  Service: Cardiovascular;  Laterality: N/A;  . removal of kidney stones  2006   cystoscopic--removed from ureter.  No prob since.    There were no vitals filed for this visit.   Subjective Assessment - 01/21/20 1411    Subjective No new complaitns. No falls or pain to  report. Was started on Xarelto yesterday and stopped Asprin due to recent episode of Afib (cardiology made the changes).    Patient is accompained by: Family member   spouse   Pertinent History PMH: HTN, alcohol abuse (ongoing use), HLD, GERD, PUD, tobacco use    Limitations Standing;Walking;House hold activities    Patient Stated Goals return to prior level of function, walk, "wheelchair is not apart of me" (wants to get out of the w/c)    Currently in Pain? No/denies    Pain Score 0-No pain             01/21/20 1414  Transfers  Transfers Sit to Stand;Stand to Sit  Sit to Stand 5: Supervision  Stand to Sit 5: Supervision  Ambulation/Gait  Ambulation/Gait Yes  Ambulation/Gait Assistance 4: Min guard  Ambulation/Gait Assistance Details 1st gait rep in/outdoors with min guard assist, min assist once with turning toward right with lateral loss of balance. 2cd gait rep with use of folder under chin to block view of floor to promote forward gaze. 3rd rep with green foam noodle strapped to back via ace wraps along spine with cues to "touch head to foam" with mirror feedback as well with pt holding head upright for ~90% of lap.   Ambulation Distance (Feet) 300 Feet (x1 in/outdoors, 115 x2)  Assistive  device Rolling walker  Gait Pattern Step-through pattern;Step-to pattern;Decreased stride length;Decreased stance time - right;Decreased step length - left  Ambulation Surface Level;Unlevel;Indoor;Outdoor;Paved         PT Short Term Goals - 12/31/19 1550      PT SHORT TERM GOAL #1   Title Patient will improve TUG to </= 30 seconds with LRAD to demo reduced fall risk (All STGs Due: 12/31/19)    Baseline 38.87 secs, 35.85 secs    Time 4    Period Weeks    Status Not Met    Target Date 12/31/19      PT SHORT TERM GOAL #2   Title Patient will improve Berg Balance to >/= 35/56 to demonstrate improved balance and reduced fall risk    Baseline 31/56, 32/56    Time 4    Period Weeks     Status Not Met      PT SHORT TERM GOAL #3   Title Patient will improve gait speed to >/= 1.5 ft/sec with LRAD to demo improved mobility    Baseline 1.13 ft/sec, 1.18 ft/sec    Time 4    Period Weeks    Status Not Met             PT Long Term Goals - 01/21/20 1413      PT LONG TERM GOAL #1   Title Patient will be independent with walking program 5x/day week with wife assistance (ALL LTG Due:01/28/2020)    Baseline TBD    Time 8    Period Weeks    Status New      PT LONG TERM GOAL #2   Title Patient will demonstrate ability to complete all transfers w/ LRAD at Mod I Level to demonstrate improved independence with functional mobility    Baseline supervision with cues for safety at times, improved from min guard assist    Status Revised      PT LONG TERM GOAL #3   Title Patient will demo ability to ambulate >400 ft on outdoor/unlevel surfaces with CGA to demo improved community mobility    Baseline unable to assess today due to weather    Time 8    Period Weeks    Status Revised      PT LONG TERM GOAL #4   Title Patient will improve TUG to </= 25 seconds to demonstrate improved mobility and reduced fall risk    Baseline 38.87 secs    Time 8    Period Weeks    Status New      PT LONG TERM GOAL #5   Title Patient will improve Berg Balance to >/= 40/56 to demonstrate reduced risk for falls    Baseline 31/56    Time 8    Period Weeks    Status New      PT LONG TERM GOAL #6   Title patient will improve gait speed to >/= 2.0 ft/sec to demonstrate improved community mobility    Baseline 1.13 ft/sec    Time 8    Period Weeks    Status New                 Plan - 01/21/20 1413    Clinical Impression Statement Today's skilled session continued to focus on gait with RW on various surfaces and on posture/forward gaze with gait. Pt with best response to use of foam noodle to back with cues to keep his head against it for improved posture/forward gaze this session. The  pt should benefit from continued PT to progress toward unmet goals.    Personal Factors and Comorbidities Comorbidity 3+    Comorbidities HTN, alcohol abuse (ongoing use), HLD, GERD, PUD, tobacco use    Examination-Activity Limitations Bed Mobility;Dressing;Locomotion Level;Stairs;Stand;Toileting;Transfers;Bathing    Examination-Participation Restrictions Driving;Interpersonal Relationship;Yard Work    [employer] Evolving/Moderate complexity    Rehab Potential Good    PT Frequency 2x / week    PT Duration 8 weeks    PT Treatment/Interventions ADLs/Self Care Home Management;Electrical Stimulation;DME Instruction;Moist Heat;Cryotherapy;Gait training;Stair training;Functional mobility training;Therapeutic activities;Therapeutic exercise;Balance training;Neuromuscular re-education;Patient/family education;Orthotic Fit/Training;Wheelchair mobility training;Passive range of motion    PT Next Visit Plan Can begin to check goals. Will be taking break from PT to allow for complaince/carryover at home. Planned D/C. Continue to work on exercises promote L weight shift and LLE strengthening. Gait with RW with empahsis on forward gaze, increased step length and object negotation (around and over)    Consulted and Agree with Plan of Care Patient;Family member/caregiver    Family Member Consulted Wife           Patient will benefit from skilled therapeutic intervention in order to improve the following deficits and impairments:  Abnormal gait, Decreased coordination, Difficulty walking, Impaired tone, Decreased safety awareness, Decreased endurance, Decreased activity tolerance, Pain, Decreased balance, Decreased knowledge of use of DME, Postural dysfunction, Impaired sensation, Decreased strength, Decreased mobility  Visit Diagnosis: Hemiplegia and hemiparesis following cerebral infarction affecting left dominant side (HCC)  Unsteadiness on feet  Difficulty in walking, not  elsewhere classified  Muscle weakness (generalized)     Problem List Patient Active Problem List   Diagnosis Date Noted  . History of tobacco abuse 01/07/2020  . Hemiparesis affecting left side as late effect of stroke ([place]) 01/07/2020  . Abnormality of gait 09/24/2019  . AKI (acute kidney injury) ([place])   . Fever   . Sepsis without acute organ dysfunction ([place])   . Elevated BUN   . Blood pressure increase diastolic   . Labile blood pressure   . Leukopenia   . Benign essential HTN   . Hypoalbuminemia   . Cerebrovascular accident (CVA) of right basal ganglia ([place]) 07/07/2019  . Acute ischemic stroke ([place])   . Dyslipidemia   . Dysphagia, post-stroke   . Polycythemia   . Stroke ([place]) 06/30/2019  . Prediabetes 12/23/2016  . Elevated LFTs 12/23/2016  . Pure hypercholesterolemia 12/21/2016  . Alcoholism ([place]) 12/21/2016  . Eczema 09/18/2013  . Essential hypertension 09/18/2013  . Gastroesophageal reflux disease without esophagitis 09/18/2013  . Tobacco abuse 09/18/2013    [name], PTA, [facility] [address] [place], [place] [address] [phone] 01/22/20, 8:50 PM   Name: Russell Russell MRN: [id number] Date of Birth: 02/01/1951

## 2020-01-24 NOTE — Progress Notes (Addendum)
Cardiology Office Note Date:  01/24/2020  Patient ID:  Russell Russell, DOB 06-09-1950, MRN [id number] PCP:  Inda Coke, PA  EP:  Dr. Rayann Heman    Chief Complaint:  new AF   History of Present Illness: Russell Russell is a 69 y.o. male with history of HTN, HLD, tobacco and ETOH abuse and most recently stroke (Multiple scattered anterior and posterior R brain infarcts embolic secondary to unclear source)>> loop.  Hospitalized April with cryptogenic stroke underwent loop implant.  Discharged to [facility] 07/07/19.  He developed fever in rehab evaluated by ID, signed off with no evidence of bacterial process, off antibiotics.  Discharged from [facility] 08/05/2019.   I saw him Jun 2021 He comes today accompanied by his wife, he is in a wheelchair, remains with L sided weakness.  Starts out patient rehab tomorrow. He denies any kind of CP, palpitations, no cardiac awareness,  No SOB or DOE. No dizzy spells, near syncope or syncope. His wife mentions the PMD stopped one of his BP medicines recently with low BP reading They have his home transmitter plugged in at home at the bedside. There was no Afib as of this visit  TODAY He was noted via loop remote to have an AF episode just over an hour in duration, he was started on Xarelto and scheduled for a visit  He is accompanied by his wife. He is doing well, was unaware of any palpitations, symptoms when notified of his AFib episode. He denies any CP, palpitations or cardiac awareness, no SOB No dizziness, near syncope or syncope. He started the xarelto a week ago, no bleeding or signs of bleeding  We discussed the indication and rational for a/c in Afib with his h/o stroke particularly Discussed risks/benefits of [place] Discussed symptoms, signs of bleeding to notify us of   Device information MDT loop implanted 07/03/2019, cryptogenic stroke  AFib hx Diagnosed 01/2020 >> xarelto (eliquis not on formulary)  AAD none   Past Medical History:    Diagnosis Date  . Alcohol abuse    drinks 1 gallon of Gin every weekend, nothing during the week x >15 yrs  . Elevated transaminase level    +elev bili: abd u/s 06/2014 showed stable small hepatic hemangiomas, o/w normal.  . Essential hypertension   . GERD (gastroesophageal reflux disease)   . Hyperlipidemia 03/2014   Atorv started-chol improved  . Impaired fasting glucose 03/2014  . Nephrolithiasis   . PUD (peptic ulcer disease)   . Stroke ([place])   . Tobacco dependence    chantix: "psych effects"    Past Surgical History:  Procedure Laterality Date  . COLONOSCOPY  2005   Recall 10 yrs ([place])  . LOOP RECORDER INSERTION N/A 07/03/2019   Procedure: LOOP RECORDER INSERTION;  Surgeon: Thompson Grayer, MD;  Location: [facility];  Service: Cardiovascular;  Laterality: N/A;  . removal of kidney stones  2006   cystoscopic--removed from ureter.  No prob since.    Current Outpatient Medications  Medication Sig Dispense Refill  . acetaminophen (TYLENOL) 325 MG tablet Take 2 tablets (650 mg total) by mouth every 4 (four) hours as needed for mild pain (or temp > 37.5 C (99.5 F)).    Marland Kitchen atorvastatin (LIPITOR) 40 MG tablet Take 40 mg by mouth daily.    . folic acid (FOLVITE) 1 MG tablet TAKE 1 TABLET BY MOUTH EVERY DAY 90 tablet 1  . halobetasol (ULTRAVATE) 0.05 % cream APPLY TO AFFECTED AREA TWICE A DAY 50  g 3  . metoprolol tartrate (LOPRESSOR) 25 MG tablet TAKE 1/2 TABLET BY MOUTH 2 TIMES DAILY. 90 tablet 0  . Multiple Vitamin (MULTIVITAMIN WITH MINERALS) TABS tablet Take 1 tablet by mouth daily.    Marland Kitchen omeprazole (PRILOSEC) 20 MG capsule Take 20 mg by mouth as needed (acid reflux).    . rivaroxaban (XARELTO) 20 MG TABS tablet Take 1 tablet (20 mg total) by mouth daily with supper. 30 tablet 3   No current facility-administered medications for this visit.    Allergies:   Penicillins   Social History:  The patient  reports that he has quit smoking. His smoking use included  cigarettes. He has a 10.00 pack-year smoking history. He has never used smokeless tobacco. He reports current alcohol use of about 8.0 standard drinks of alcohol per week. He reports that he does not use drugs.   Family History:  The patient's family history includes Breast cancer in his sister and sister; Cancer in his father; Colon cancer in his mother.  ROS:  Please see the history of present illness.    All other systems are reviewed and otherwise negative.   PHYSICAL EXAM:  VS:  There were no vitals taken for this visit. BMI: There is no height or weight on file to calculate BMI. Well nourished, well developed, though thin, in no acute distress  HEENT: normocephalic, atraumatic  Neck: no JVD, carotid bruits or masses Cardiac:  RRR; no significant murmurs, no rubs, or gallops Lungs:  CTA b/l, no wheezing, rhonchi or rales  Abd: soft, nontender MS: no deformity or atrophy Ext: no edema  Skin: warm and dry, no rash Neuro: Left sided weakness Psych: euthymic mood, full affect  ILR site is well healed, stable, no tethering or discomfort   EKG:  Not done today   ILR interrogation done today and reviewed by myself:  SR currently No further AFib episodes    07/24/2019 TTE  IMPRESSIONS  1. Left ventricular ejection fraction, by estimation, is 60 to 65%. The  left ventricle has normal function. The left ventricle has no regional  wall motion abnormalities. There is moderate left ventricular hypertrophy  of the basal-septal segment. Left  ventricular diastolic parameters were normal.  2. Right ventricular systolic function is normal. The right ventricular  size is normal. There is mildly elevated pulmonary artery systolic  pressure. The estimated right ventricular systolic pressure is 53.9 mmHg.  3. The mitral valve is normal in structure. Trivial mitral valve  regurgitation. No evidence of mitral stenosis.  4. The aortic valve is normal in structure. Aortic valve  regurgitation is  not visualized. No aortic stenosis is present.  5. The inferior vena cava is dilated in size with <50% respiratory  variability, suggesting right atrial pressure of 15 mmHg.   Conclusion(s)/Recommendation(s): No evidence of valvular vegetations on  this transthoracic echocardiogram. Would recommend a transesophageal  echocardiogram to exclude infective endocarditis if clinically indicated.    07/01/2019: TTE IMPRESSIONS  1. Very poor study. All views basically obtained from the subcostal  views.  2. Left ventricular ejection fraction, by estimation, is 55 to 60%. The  left ventricle has normal function. The left ventricle has no regional  wall motion abnormalities. Left ventricular diastolic function could not  be evaluated.  3. Right ventricular systolic function is normal. The right ventricular  size is normal.  4. The mitral valve is grossly normal. No evidence of mitral valve  regurgitation. No evidence of mitral stenosis.  5. The aortic  valve is grossly normal. Aortic valve regurgitation is not  visualized. No aortic stenosis is present.  6. The inferior vena cava is normal in size with greater than 50%  respiratory variability, suggesting right atrial pressure of 3 mmHg.  7. Agitated saline contrast bubble study was negative, with no evidence  of any interatrial shunt.   Conclusion(s)/Recommendation(s): No intracardiac source of embolism  detected on this transthoracic study. A transesophageal echocardiogram is  recommended to exclude cardiac source of embolism if clinically indicated.    Recent Labs: 07/01/2019: Magnesium 1.7; TSH 2.209 07/08/2019: ALT 19 07/31/2019: Hemoglobin 14.6; Platelets 375 08/04/2019: BUN 15; Creatinine, Ser 1.02; Potassium 4.3; Sodium 140  07/01/2019: Cholesterol 196; HDL 77; LDL Cholesterol 109; Total CHOL/HDL Ratio 2.5; Triglycerides 52; VLDL 10   CrCl cannot be calculated (Patient's most recent lab result is older than  the maximum 21 days allowed.).   Wt Readings from Last 3 Encounters:  01/07/20 168 lb (76.2 kg)  12/29/19 168 lb (76.2 kg)  11/26/19 160 lb (72.6 kg)     Other studies reviewed: Additional studies/records reviewed today include: summarized above  ASSESSMENT AND PLAN:  1. Cryptogenic stroke 2. New finding of AFib     CHA2DS2Vasc is 4, on Xarelto, appropriately dosed     Follow burden prior to change in tx     Update BMET/CBC  3. HTN     Looks OK     Continue management with the PMD   Disposition: see him back in 44mo, continue monthly remotes  Current medicines are reviewed at length with the patient today.  The patient did not have any concerns regarding medicines.  Venetia Night, PA-C 01/24/2020 6:55 PM     [place]  [address] [phone] (office)  [phone] (fax)

## 2020-01-26 ENCOUNTER — Ambulatory Visit: Payer: Medicare Other

## 2020-01-26 ENCOUNTER — Other Ambulatory Visit: Payer: Self-pay

## 2020-01-26 ENCOUNTER — Encounter: Payer: BC Managed Care – PPO | Admitting: Occupational Therapy

## 2020-01-26 ENCOUNTER — Telehealth: Payer: Self-pay | Admitting: *Deleted

## 2020-01-26 DIAGNOSIS — R262 Difficulty in walking, not elsewhere classified: Secondary | ICD-10-CM

## 2020-01-26 DIAGNOSIS — M6281 Muscle weakness (generalized): Secondary | ICD-10-CM

## 2020-01-26 DIAGNOSIS — R2689 Other abnormalities of gait and mobility: Secondary | ICD-10-CM

## 2020-01-26 DIAGNOSIS — R2681 Unsteadiness on feet: Secondary | ICD-10-CM

## 2020-01-26 DIAGNOSIS — I69352 Hemiplegia and hemiparesis following cerebral infarction affecting left dominant side: Secondary | ICD-10-CM

## 2020-01-26 NOTE — Therapy (Addendum)
[facility] [address] [place], [name], [address] Phone: [phone]   Fax:  [phone]  Physical Therapy Treatment/Re-Cert [facility]la [facility] Summary  Patient Details  Name: Russell Russell MRN: [id number] Date of Birth: [birth year] Referring Provider (PT): Lauraine Rinne, PA-C  PHYSICAL THERAPY DISCHARGE SUMMARY  Visits from Start of Care: 54  Current functional level related to goals / functional outcomes: See Clinical Impression Statement for Details   Remaining deficits: Abnormal Gait, Impaired Balance, Increased Fall Risk   Education / Equipment: Educated on walking daily/HEP  Plan: Patient agrees to discharge.  Patient goals were partially met. Patient is being discharged due to being pleased with the current functional level.  ?????          Encounter Date: 01/26/2020   PT End of Session - 01/26/20 1150    Visit Number 46    Number of Visits 46    Date for PT Re-Evaluation 03/02/20   new POC for 8 weeks, Cert for 90 days   Authorization Type [employer] (follow medicare guidelines, 10th visit PN)    Progress Note Due on Visit 80    PT Start Time 1147    PT Stop Time 1231    PT Time Calculation (min) 44 min    Equipment Utilized During Treatment Gait belt    Activity Tolerance Patient tolerated treatment well    Behavior During Therapy WFL for tasks assessed/performed           Past Medical History:  Diagnosis Date  . Alcohol abuse    drinks 1 gallon of Gin every weekend, nothing during the week x >15 yrs  . Elevated transaminase level    +elev bili: abd u/s 06/2014 showed stable small hepatic hemangiomas, o/w normal.  . Essential hypertension   . GERD (gastroesophageal reflux disease)   . Hyperlipidemia 03/2014   Atorv started-chol improved  . Impaired fasting glucose 03/2014  . Nephrolithiasis   . PUD (peptic ulcer disease)   . Stroke ([place])   . Tobacco dependence    chantix: "psych effects"     Past Surgical History:  Procedure Laterality Date  . COLONOSCOPY  2005   Recall 10 yrs ([place])  . LOOP RECORDER INSERTION N/A 07/03/2019   Procedure: LOOP RECORDER INSERTION;  Surgeon: Thompson Grayer, MD;  Location: [facility];  Service: Cardiovascular;  Laterality: N/A;  . removal of kidney stones  2006   cystoscopic--removed from ureter.  No prob since.    There were no vitals filed for this visit.   Subjective Assessment - 01/26/20 1149    Subjective Patient reports no new changes/complaints since last visit. No falls. No pain.    Patient is accompained by: Family member   spouse   Pertinent History PMH: HTN, alcohol abuse (ongoing use), HLD, GERD, PUD, tobacco use    Limitations Standing;Walking;House hold activities    Patient Stated Goals return to prior level of function, walk, "wheelchair is not apart of me" (wants to get out of the w/c)    Currently in Pain? No/denies                 [facility] Adult PT Treatment/Exercise - 01/26/20 1159      Transfers   Transfers Sit to Stand;Stand to Sit    Sit to Stand 5: Supervision    Sit to Stand Details Verbal cues for sequencing;Verbal cues for precautions/safety;Verbal cues for safe use of DME/AE    Stand to Sit 5: Supervision  Stand to Sit Details (indicate cue type and reason) Verbal cues for sequencing;Verbal cues for technique;Verbal cues for safe use of DME/AE    Stand Pivot Transfers 5: Supervision    Stand Pivot Transfer Details (indicate cue type and reason) completed transfer from w/c <> mat, patient able to complete with supervision.       Ambulation/Gait   Ambulation/Gait Yes    Ambulation/Gait Assistance 4: Min guard    Ambulation/Gait Assistance Details Completed ambulation on outdoor surfaces x 425 ft with RW, CGA throughout completion. Continue to provide verbal cues for improved step length and posture to faciliate improved visual scanning with ambulation    Ambulation Distance (Feet) 425 Feet     Assistive device Rolling walker    Gait Pattern Step-through pattern;Step-to pattern;Decreased stride length;Decreased stance time - right;Decreased step length - left    Ambulation Surface Unlevel;Outdoor;Paved    Gait velocity 25.9 secs = 1.26 ft/sec   with RW     Standardized Balance Assessment   Standardized Balance Assessment Berg Balance Test;Timed Up and Go Test      Berg Balance Test   Sit to Stand Able to stand without using hands and stabilize independently    Standing Unsupported Able to stand safely 2 minutes    Sitting with Back Unsupported but Feet Supported on Floor or Stool Able to sit safely and securely 2 minutes    Stand to Sit Sits safely with minimal use of hands    Transfers Able to transfer safely, definite need of hands    Standing Unsupported with Eyes Closed Able to stand 10 seconds safely    Standing Ubsupported with Feet Together Able to place feet together independently and stand for 1 minute with supervision    From Standing, Reach Forward with Outstretched Arm Can reach forward >12 cm safely (5")    From Standing Position, Pick up Object from [place] to pick up shoe, needs supervision    From Standing Position, Turn to Look Behind Over each Shoulder Looks behind one side only/other side shows less weight shift    Turn 360 Degrees Needs close supervision or verbal cueing    Standing Unsupported, Alternately Place Feet on Step/Stool Able to complete >2 steps/needs minimal assist    Standing Unsupported, One Foot in Front Needs help to step but can hold 15 seconds    Standing on One Leg Tries to lift leg/unable to hold 3 seconds but remains standing independently    Total Score 39      Timed Up and Go Test   TUG Normal TUG    Normal TUG (seconds) 31.09   w/ RW                 PT Education - 01/26/20 1253    Education Details Educated on HEP compliance/walking daily; Progress toward LTGs    Person(s) Educated Patient;Spouse    Methods  Explanation    Comprehension Verbalized understanding            PT Short Term Goals - 12/31/19 1550      PT SHORT TERM GOAL #1   Title Patient will improve TUG to </= 30 seconds with LRAD to demo reduced fall risk (All STGs Due: 12/31/19)    Baseline 38.87 secs, 35.85 secs    Time 4    Period Weeks    Status Not Met    Target Date 12/31/19      PT SHORT TERM GOAL #2   Title Patient  will improve Berg Balance to >/= 35/56 to demonstrate improved balance and reduced fall risk    Baseline 31/56, 32/56    Time 4    Period Weeks    Status Not Met      PT SHORT TERM GOAL #3   Title Patient will improve gait speed to >/= 1.5 ft/sec with LRAD to demo improved mobility    Baseline 1.13 ft/sec, 1.18 ft/sec    Time 4    Period Weeks    Status Not Met             PT Long Term Goals - 01/26/20 1155      PT LONG TERM GOAL #1   Title Patient will be independent with walking program 5x/day week with wife assistance (ALL LTG Due:01/28/2020)    Baseline walking daily within home and in community    Time 8    Period Weeks    Status Achieved      PT LONG TERM GOAL #2   Title Patient will demonstrate ability to complete all transfers w/ LRAD at Mod I Level to demonstrate improved independence with functional mobility    Baseline continue to complete all transfers with RW, supervision level    Status Not Met      PT LONG TERM GOAL #3   Title Patient will demo ability to ambulate >400 ft on outdoor/unlevel surfaces with CGA to demo improved community mobility    Baseline 425 ft on outdoor paved surfaces with CGA    Time 8    Period Weeks    Status Achieved      PT LONG TERM GOAL #4   Title Patient will improve TUG to </= 25 seconds to demonstrate improved mobility and reduced fall risk    Baseline 38.87 secs, 31.09    Time 8    Period Weeks    Status Not Met      PT LONG TERM GOAL #5   Title Patient will improve Berg Balance to >/= 40/56 to demonstrate reduced risk for falls     Baseline 31/56, 39/56    Time 8    Period Weeks    Status Not Met      PT LONG TERM GOAL #6   Title patient will improve gait speed to >/= 2.0 ft/sec to demonstrate improved community mobility    Baseline 1.13 ft/sec, 1.26 ft/sec    Time 8    Period Weeks    Status Not Met                 Plan - 01/26/20 1320    Clinical Impression Statement Today's skilled PT session included assessment of patient's progress toward all LTG. Patient able to meet LTG #1 and #3 today demonstrating improved community ambulation with CGA. Patient demonstrating progress toward all other goals at this time. Patient improved Berg Balance to 39/56 demonstrating improved balance, and improved gait speed to 1/26 ft/sec with RW. PT educating on break from PT services at this time, patient and wife verbalizing agreement. PT educating on importance of daily mobility within and outside the home for improved function and compliance with HEP. Patient verbalizing understanding. Patient has made significnat progress with PT services including improved balance, reduced fall risk, improved strength, and improved functional mobility.    Personal Factors and Comorbidities Comorbidity 3+    Comorbidities HTN, alcohol abuse (ongoing use), HLD, GERD, PUD, tobacco use    Examination-Activity Limitations Bed Mobility;Dressing;Locomotion Level;Stairs;Stand;Toileting;Transfers;Bathing    Examination-Participation Restrictions  Driving;Interpersonal Relationship;Yard Work    [employer]chant [employer] officer Evolving/Moderate complexity    Rehab Potential Good    PT Frequency 2x / week    PT Duration 8 weeks    PT Treatment/Interventions ADLs/Self Care Home Management;Electrical Stimulation;DME Instruction;Moist Heat;Cryotherapy;Gait training;Stair training;Functional mobility training;Therapeutic activities;Therapeutic exercise;Balance training;Neuromuscular re-education;Patient/family education;Orthotic  Fit/Training;Wheelchair mobility training;Passive range of motion    Consulted and Agree with Plan of Care Patient;Family member/caregiver    Family Member Consulted Wife           Patient will benefit from skilled therapeutic intervention in order to improve the following deficits and impairments:  Abnormal gait, Decreased coordination, Difficulty walking, Impaired tone, Decreased safety awareness, Decreased endurance, Decreased activity tolerance, Pain, Decreased balance, Decreased knowledge of use of DME, Postural dysfunction, Impaired sensation, Decreased strength, Decreased mobility  Visit Diagnosis: Hemiplegia and hemiparesis following cerebral infarction affecting left dominant side (HCC)  Unsteadiness on feet  Difficulty in walking, not elsewhere classified  Muscle weakness (generalized)  Other abnormalities of gait and mobility     Problem List Patient Active Problem List   Diagnosis Date Noted  . History of tobacco abuse 01/07/2020  . Hemiparesis affecting left side as late effect of stroke ([place]) 01/07/2020  . Abnormality of gait 09/24/2019  . AKI (acute kidney injury) ([place])   . Fever   . Sepsis without acute organ dysfunction ([place])   . Elevated BUN   . Blood pressure increase diastolic   . Labile blood pressure   . Leukopenia   . Benign essential HTN   . Hypoalbuminemia   . Cerebrovascular accident (CVA) of right basal ganglia ([place]) 07/07/2019  . Acute ischemic stroke ([place])   . Dyslipidemia   . Dysphagia, post-stroke   . Polycythemia   . Stroke ([place]) 06/30/2019  . Prediabetes 12/23/2016  . Elevated LFTs 12/23/2016  . Pure hypercholesterolemia 12/21/2016  . Alcoholism ([place]) 12/21/2016  . Eczema 09/18/2013  . Essential hypertension 09/18/2013  . Gastroesophageal reflux disease without esophagitis 09/18/2013  . Tobacco abuse 09/18/2013    Jones Bales, PT, DPT 01/26/2020, 1:25 PM  [facility] [address] [place], [name], [address] Phone: [phone]   Fax:  [phone]  Name: Russell Russell MRN: [id number] Date of Birth: 05/12/1950

## 2020-01-26 NOTE — Telephone Encounter (Signed)
Spoke with [name], on dpr and informed her the patient's  CT angiogram of the neck appears normal. The previously seen spot on the right carotid is no longer seen. She  verbalized understanding, appreciation.

## 2020-01-26 NOTE — Addendum Note (Signed)
Addended by: Baldomero Lamy B on: 01/26/2020 04:59 PM   Modules accepted: Orders

## 2020-01-27 ENCOUNTER — Ambulatory Visit (INDEPENDENT_AMBULATORY_CARE_PROVIDER_SITE_OTHER): Payer: Medicare Other | Admitting: Physician Assistant

## 2020-01-27 ENCOUNTER — Encounter: Payer: Self-pay | Admitting: Physician Assistant

## 2020-01-27 VITALS — BP 110/82 | HR 76 | Ht 70.0 in | Wt 164.0 lb

## 2020-01-27 DIAGNOSIS — I480 Paroxysmal atrial fibrillation: Secondary | ICD-10-CM | POA: Diagnosis not present

## 2020-01-27 DIAGNOSIS — Z79899 Other long term (current) drug therapy: Secondary | ICD-10-CM

## 2020-01-27 DIAGNOSIS — I639 Cerebral infarction, unspecified: Secondary | ICD-10-CM

## 2020-01-27 DIAGNOSIS — Z4509 Encounter for adjustment and management of other cardiac device: Secondary | ICD-10-CM | POA: Diagnosis not present

## 2020-01-27 DIAGNOSIS — I10 Essential (primary) hypertension: Secondary | ICD-10-CM

## 2020-01-27 LAB — BASIC METABOLIC PANEL
BUN/Creatinine Ratio: 13 (ref 10–24)
BUN: 16 mg/dL (ref 8–27)
CO2: 26 mmol/L (ref 20–29)
Calcium: 10.2 mg/dL (ref 8.6–10.2)
Chloride: 106 mmol/L (ref 96–106)
Creatinine, Ser: 1.24 mg/dL (ref 0.76–1.27)
GFR calc Af Amer: 69 mL/min/{1.73_m2} (ref >59)
GFR calc non Af Amer: 59 mL/min/{1.73_m2} — ABNORMAL LOW (ref >59)
Glucose: 86 mg/dL (ref 65–99)
Potassium: 4.8 mmol/L (ref 3.5–5.2)
Sodium: 140 mmol/L (ref 134–144)

## 2020-01-27 LAB — CBC
Hematocrit: 39.6 % (ref 37.5–51.0)
Hemoglobin: 13.6 g/dL (ref 13.0–17.7)
MCH: 31.4 pg (ref 26.6–33.0)
MCHC: 34.3 g/dL (ref 31.5–35.7)
MCV: 92 fL (ref 79–97)
Platelets: 285 10*3/uL (ref 150–450)
RBC: 4.33 x10E6/uL (ref 4.14–5.80)
RDW: 13.2 % (ref 11.6–15.4)
WBC: 4.9 10*3/uL (ref 3.4–10.8)

## 2020-01-27 NOTE — Patient Instructions (Signed)
Medication Instructions:  ° °Your physician recommends that you continue on your current medications as directed. Please refer to the Current Medication list given to you today. ° °*If you need a refill on your cardiac medications before your next appointment, please call your pharmacy* ° ° °Lab Work:  BMET AND CBC TODAY  ° ° °If you have labs (blood work) drawn today and your tests are completely normal, you will receive your results only by: °MyChart Message (if you have MyChart) OR °A paper copy in the mail °If you have any lab test that is abnormal or we need to change your treatment, we will call you to review the results. ° ° °Testing/Procedures: NONE ORDERED  TODAY ° ° °Follow-Up: °At [facility], you and your health needs are our priority.  As part of our continuing mission to provide you with exceptional heart care, we have created designated Provider Care Teams.  These Care Teams include your primary Cardiologist (physician) and Advanced Practice Providers (APPs -  Physician Assistants and Nurse Practitioners) who all work together to provide you with the care you need, when you need it. ° °We recommend signing up for the patient portal called "MyChart".  Sign up information is provided on this After Visit Summary.  MyChart is used to connect with patients for Virtual Visits (Telemedicine).  Patients are able to view lab/test results, encounter notes, upcoming appointments, etc.  Non-urgent messages can be sent to your provider as well.   °To learn more about what you can do with MyChart, go to [email or web address].com.   ° °Your next appointment:   °6 month(s) ° °The format for your next appointment:   °In Person ° °Provider:   °Renee Ursuy, PA-C  ° ° °Other Instructions °  °

## 2020-01-28 ENCOUNTER — Encounter: Payer: BC Managed Care – PPO | Admitting: Occupational Therapy

## 2020-01-28 ENCOUNTER — Ambulatory Visit: Payer: Medicare Other

## 2020-02-02 ENCOUNTER — Ambulatory Visit: Payer: BC Managed Care – PPO

## 2020-02-02 ENCOUNTER — Encounter: Payer: BC Managed Care – PPO | Admitting: Occupational Therapy

## 2020-02-09 ENCOUNTER — Encounter: Payer: BC Managed Care – PPO | Admitting: Occupational Therapy

## 2020-02-11 ENCOUNTER — Encounter: Payer: BC Managed Care – PPO | Admitting: Occupational Therapy

## 2020-02-12 ENCOUNTER — Telehealth: Payer: Self-pay

## 2020-02-12 NOTE — Telephone Encounter (Signed)
**Note De-Identified Russell Russell Obfuscation** Th provider page of a [name] application was left at the office with a request to call [name]) at [phone] when ready to pick up.  I have completed the provider page and emailed it to Dr Bonita Quin office so she can obtain his signature, date it and to call [name].

## 2020-02-15 NOTE — Telephone Encounter (Signed)
Notified wife [name] that paperwork was ready to be picked up at front desk.

## 2020-02-16 ENCOUNTER — Encounter: Payer: BC Managed Care – PPO | Admitting: Occupational Therapy

## 2020-02-18 ENCOUNTER — Encounter: Payer: BC Managed Care – PPO | Admitting: Occupational Therapy

## 2020-02-20 LAB — CUP PACEART REMOTE DEVICE CHECK
Date Time Interrogation Session: 20211210003201
Implantable Pulse Generator Implant Date: 20210423

## 2020-02-22 ENCOUNTER — Ambulatory Visit (INDEPENDENT_AMBULATORY_CARE_PROVIDER_SITE_OTHER): Payer: Medicare Other

## 2020-02-22 DIAGNOSIS — I6381 Other cerebral infarction due to occlusion or stenosis of small artery: Secondary | ICD-10-CM

## 2020-02-23 ENCOUNTER — Encounter: Payer: BC Managed Care – PPO | Admitting: Occupational Therapy

## 2020-02-24 ENCOUNTER — Telehealth: Payer: Self-pay | Admitting: Internal Medicine

## 2020-02-24 NOTE — Telephone Encounter (Signed)
Patient calling the office for samples of medication:   1.  What medication and dosage are you requesting samples for?  rivaroxaban (XARELTO) 20 MG TABS tablet  2.  Are you currently out of this medication? No  Patient has two pills left. Patient is waiting on a response from [employer] about a patient assistance program but nothing has been finalized yet and should hear something within the next week or two. . Wife wanted to know if the office had samples to last him two weeks.

## 2020-02-25 ENCOUNTER — Encounter: Payer: BC Managed Care – PPO | Admitting: Occupational Therapy

## 2020-02-25 NOTE — Telephone Encounter (Signed)
Called pt and spoke with pt's wife, informing her that I am leaving 2 bottles of Xarelto 20 mg tablets samples at the front desk for them to pick up and if they has any other problems, questions or concerns, to give our office a call. Pt's wife verbalized understanding.   [id number]   Exp: 04/2021

## 2020-03-08 NOTE — Progress Notes (Signed)
[facility]link Summary Report / Loop Recorder 

## 2020-03-14 ENCOUNTER — Telehealth: Payer: Self-pay | Admitting: Internal Medicine

## 2020-03-14 NOTE — Telephone Encounter (Signed)
Patient calling the office for samples of medication:   1.  What medication and dosage are you requesting samples for? rivaroxaban (XARELTO) 20 MG TABS tablet    2.  Are you currently out of this medication? Patient has two pills left.     Patients wife is calling in stating that they are waiting on a response from [employer] about a patient assistance program but nothing has been finalized yet and should hear something within the next few days. Wife wanted to know if the office had samples to last him two weeks.

## 2020-03-15 NOTE — Telephone Encounter (Signed)
Epimenio Foot, LPN, pt is requesting samples of Xarelto, could you please advise on this matter? Thanks

## 2020-03-15 NOTE — Telephone Encounter (Signed)
**Note De-Identified Jencarlo Bonadonna Obfuscation** I called [employer] who advised me that they are still behind in   processing all of the applications they have received. Per [employer] the pts application should be determined within a few days as it is in processing now.  I called [name], the pts wife and DPR, back with this info and she states that [employer] has been telling her that for weeks now and expressed much appreciation for the call I placed to [employer] and hopes that my call will speed them along.  I advised her that we are leaving 2 bottles of Xarelto 20 mg samples in the front office at Dr Amedeo Plenty office for them to pick up.  [name] states that she will continue to call [employer] until a determination has been made and is aware to call Larita Fife at [phone] if she needs further assistance with this.

## 2020-03-15 NOTE — Telephone Encounter (Signed)
Samples come from the refill dept. Will send back to refill department to process for samples.

## 2020-03-17 ENCOUNTER — Other Ambulatory Visit: Payer: Self-pay | Admitting: Physician Assistant

## 2020-03-17 NOTE — Telephone Encounter (Signed)
LAST APPOINTMENT DATE: 12/21/2019   NEXT APPOINTMENT DATE: Visit date not found    LAST REFILL:  Done by historical provider

## 2020-03-18 NOTE — Telephone Encounter (Signed)
**Note De-identified Manmeet Arzola Obfuscation** Letter received from [employer] stating that they denied the pt asst with his Xarelto. Reason: The pt has ins coverage for Xarelto. Record ID: [id number]  The letter states that they have notified th pt of this as well.  

## 2020-03-18 NOTE — Telephone Encounter (Addendum)
**Note De-identified Russell Russell Obfuscation** Letter received from [employer] stating that they denied the pt asst with his Xarelto. Reason: The pt has ins coverage for Xarelto. Record ID: [id number]  The letter states that they have notified th pt of this as well.  

## 2020-03-18 NOTE — Telephone Encounter (Signed)
**Note De-Identified Russell Russell Obfuscation** Letter received from [employer] stating that they denied the pt asst with his Xarelto. Reason: The pt has ins coverage for Xarelto. Record ID: [id number]  The letter states that they have notified th pt of this as well.

## 2020-03-21 ENCOUNTER — Telehealth: Payer: Self-pay | Admitting: Internal Medicine

## 2020-03-21 ENCOUNTER — Telehealth: Payer: Self-pay

## 2020-03-21 MED ORDER — METOPROLOL TARTRATE 25 MG PO TABS
25.0000 mg | ORAL_TABLET | Freq: Every day | ORAL | 3 refills | Status: DC
Start: 2020-03-21 — End: 2020-12-26

## 2020-03-21 NOTE — Telephone Encounter (Signed)
Pt's medication was sent to pt's pharmacy as requested. Confirmation received.  °

## 2020-03-21 NOTE — Telephone Encounter (Signed)
Spoke to Mrs. Reffett, told her need to contact Cardiology for medication refill, [facility] has not prescribed this. Told her Cardiology is following him for this. Mrs. Bonczek verbalized understanding.

## 2020-03-21 NOTE — Telephone Encounter (Signed)
Pt requesting refill, okay to fill? Has not been prescribed by you.

## 2020-03-21 NOTE — Telephone Encounter (Signed)
Per most recent cardiology visit note on 01/27/20, they are managing this medication. Refills need to come from their office.

## 2020-03-21 NOTE — Telephone Encounter (Signed)
.   LAST APPOINTMENT DATE: 08/18/2019  NEXT APPOINTMENT DATE:@Visit  date not found  MEDICATION:metoprolol tartrate (LOPRESSOR) 25 MG tablet  PHARMACY:[id number] - [place], [place] - [address]   CLINICAL FILLS OUT ALL BELOW:   LAST REFILL:  QTY:  REFILL DATE:    OTHER COMMENTS:    Okay for refill?  Please advise

## 2020-03-21 NOTE — Telephone Encounter (Signed)
*  STAT* If patient is at the pharmacy, call can be transferred to refill team.   1. Which medications need to be refilled? (please list name of each medication and dose if known) metoprolol tartrate (LOPRESSOR) 25 MG tablet  2. Which pharmacy/location (including street and city if local pharmacy) is medication to be sent to? [id number] - [place], [place] - [address]  3. Do they need a 30 day or 90 day supply? [place]

## 2020-03-24 ENCOUNTER — Telehealth: Payer: Self-pay | Admitting: Internal Medicine

## 2020-03-24 ENCOUNTER — Other Ambulatory Visit: Payer: Self-pay

## 2020-03-24 MED ORDER — RIVAROXABAN 20 MG PO TABS
20.0000 mg | ORAL_TABLET | Freq: Every day | ORAL | 10 refills | Status: DC
Start: 2020-03-24 — End: 2020-03-25

## 2020-03-24 NOTE — Telephone Encounter (Signed)
Xarelto 20mg  refill request received. Pt is 70 years old, weight-74.4kg, Crea-1.24 on 01/27/2020, last seen by Tommye Standard on 01/27/2020, Diagnosis-Afib, CrCl-6ml/min; Dose is appropriate based on dosing criteria. Will send in refill to requested pharmacy.

## 2020-03-24 NOTE — Telephone Encounter (Signed)
Patient calling the office for samples of medication:   1.  What medication and dosage are you requesting samples for? rivaroxaban (XARELTO) 20 MG TABS tablet  2.  Are you currently out of this medication? Has enough for this week and 3 days of next week

## 2020-03-24 NOTE — Telephone Encounter (Signed)
**Note De-Identified Ronak Duquette Obfuscation** The pts wife/DPR is advised that Warfarin is the only generic anticoagulant currently on the market and that all others are name brand and more expensive. We briefly discussed the pt switching to Warfarin as it is less expensive but will require monthly Coumadin Clinic visits for finger sticks to monitor the thickness of his blood.  We also discussed them contacting [employer] to ask if the pt has a deductible (Per Eliquis cost of $430/30 day supply he does) and to ask how much Eliquis will cost the pt once he meets his deductible.  She thanked me for calling her back to discuss and states that she is calling [employer] now to ask questions about the pts coverage. She is aware to call Jeani Hawking back at [phone] if they decide to switch to Warfarin otherwise the pt will remain on Eliquis.

## 2020-03-24 NOTE — Telephone Encounter (Signed)
Wyonia Hough, LPN, can you please advise on this matter? Thanks

## 2020-03-24 NOTE — Telephone Encounter (Signed)
Russell Russell is calling stating Russell Russell is not eligible for the program due to having insurance. She is wanting to know if Russell Russell can be prescribed an alternative medication due to this because they cannot afford the cost of this medication. Please advise.

## 2020-03-25 MED ORDER — RIVAROXABAN 20 MG PO TABS
20.0000 mg | ORAL_TABLET | Freq: Every day | ORAL | 3 refills | Status: DC
Start: 2020-03-25 — End: 2020-03-30

## 2020-03-25 NOTE — Telephone Encounter (Signed)
**Note De-identified Russell Russell Obfuscation** [name] states that she called [employer] and was advised that if they use their mail pharmacy Xarelto will cost the pt $125/90 day supply.  Per her request I have e-scribed the pts Xarelto RX to [facility] for #[facility] with 3 refills.  She is aware that we are leaving the pt 1 bottle of Xarelto 20mg samples in the front office for them to pick up as he will run out of Xarelto while waiting for his refill to arrive in the mail.  She thanked me for our assistance. 

## 2020-03-25 NOTE — Telephone Encounter (Signed)
Leaving pt 1 bottle of Xarelto 20 mg tablets at the front desk for pt to pick up. Lot: [id number]  Exp: 2/23 Thanks Jeani Hawking, LPN.

## 2020-03-25 NOTE — Telephone Encounter (Signed)
Russell Russell states that she called [employer] and was advised that if they use their mail pharmacy Xarelto will cost the pt $125/90 day supply.  Per her request I have e-scribed the pts Xarelto RX to [facility] for #90 with 3 refills.  She is aware that we are leaving the pt 1 bottle of Xarelto 20mg  samples in the front office for them to pick up as he will run out of Xarelto while waiting for his refill to arrive in the mail.  She thanked me for our assistance.

## 2020-03-28 ENCOUNTER — Ambulatory Visit (INDEPENDENT_AMBULATORY_CARE_PROVIDER_SITE_OTHER): Payer: Medicare HMO

## 2020-03-28 DIAGNOSIS — I6340 Cerebral infarction due to embolism of unspecified cerebral artery: Secondary | ICD-10-CM | POA: Diagnosis not present

## 2020-03-30 ENCOUNTER — Other Ambulatory Visit: Payer: Self-pay | Admitting: Pharmacist

## 2020-03-30 MED ORDER — RIVAROXABAN 20 MG PO TABS
20.0000 mg | ORAL_TABLET | Freq: Every day | ORAL | 1 refills | Status: DC
Start: 2020-03-30 — End: 2020-09-30

## 2020-03-31 LAB — CUP PACEART REMOTE DEVICE CHECK
Date Time Interrogation Session: 20220111230215
Implantable Pulse Generator Implant Date: 20210423

## 2020-04-05 DIAGNOSIS — R509 Fever, unspecified: Secondary | ICD-10-CM | POA: Diagnosis not present

## 2020-04-05 DIAGNOSIS — I639 Cerebral infarction, unspecified: Secondary | ICD-10-CM | POA: Diagnosis not present

## 2020-04-05 DIAGNOSIS — A419 Sepsis, unspecified organism: Secondary | ICD-10-CM | POA: Diagnosis not present

## 2020-04-05 DIAGNOSIS — Z720 Tobacco use: Secondary | ICD-10-CM | POA: Diagnosis not present

## 2020-04-05 DIAGNOSIS — D72819 Decreased white blood cell count, unspecified: Secondary | ICD-10-CM | POA: Diagnosis not present

## 2020-04-05 DIAGNOSIS — R0989 Other specified symptoms and signs involving the circulatory and respiratory systems: Secondary | ICD-10-CM | POA: Diagnosis not present

## 2020-04-05 DIAGNOSIS — F1010 Alcohol abuse, uncomplicated: Secondary | ICD-10-CM | POA: Diagnosis not present

## 2020-04-05 DIAGNOSIS — R030 Elevated blood-pressure reading, without diagnosis of hypertension: Secondary | ICD-10-CM | POA: Diagnosis not present

## 2020-04-05 DIAGNOSIS — E8809 Other disorders of plasma-protein metabolism, not elsewhere classified: Secondary | ICD-10-CM | POA: Diagnosis not present

## 2020-04-11 ENCOUNTER — Encounter: Payer: Medicare HMO | Admitting: Physical Medicine & Rehabilitation

## 2020-04-11 NOTE — Progress Notes (Signed)
[facility]link Summary Report / Loop Recorder 

## 2020-04-27 LAB — CUP PACEART REMOTE DEVICE CHECK
Date Time Interrogation Session: 20220213230604
Implantable Pulse Generator Implant Date: 20210423

## 2020-04-28 ENCOUNTER — Encounter: Payer: Medicare HMO | Attending: Physical Medicine & Rehabilitation | Admitting: Physical Medicine & Rehabilitation

## 2020-04-28 ENCOUNTER — Encounter: Payer: Self-pay | Admitting: Physical Medicine & Rehabilitation

## 2020-04-28 ENCOUNTER — Other Ambulatory Visit: Payer: Self-pay

## 2020-04-28 VITALS — BP 132/95 | HR 84 | Temp 98.7°F | Ht 70.0 in | Wt 187.4 lb

## 2020-04-28 DIAGNOSIS — R269 Unspecified abnormalities of gait and mobility: Secondary | ICD-10-CM

## 2020-04-28 DIAGNOSIS — F1011 Alcohol abuse, in remission: Secondary | ICD-10-CM | POA: Diagnosis not present

## 2020-04-28 DIAGNOSIS — I69354 Hemiplegia and hemiparesis following cerebral infarction affecting left non-dominant side: Secondary | ICD-10-CM

## 2020-04-28 DIAGNOSIS — I6381 Other cerebral infarction due to occlusion or stenosis of small artery: Secondary | ICD-10-CM | POA: Diagnosis not present

## 2020-04-28 NOTE — Progress Notes (Signed)
Subjective:    Patient ID: Russell Russell, male    DOB: June 23, 1950, 70 y.o.   MRN: [id number]   HPI Right-handed male with history of alcohol and tobacco use, hyperlipidemia presents for hospital follow-up after receiving CIR for multiple anterior and posterior right brain infarcts.  Wife supplements history.  Last clinic visit on 01/07/2020.  Since that time, pt are on hold.  He does not have follow up with Neuro.  He is following up with Cards. BP is relatively controlled. Denies tobacco use.  States drinking 6 pack/week. Denies falls. Continues to use walker.  Pain Inventory Average Pain 0 Pain Right Now 0 My pain is no pain but left leg side weakness in the hands & legs.  In the last 24 hours, has pain interfered with the following? General activity 3 Relation with others 0 Enjoyment of life 0 What TIME of day is your pain at its worst? Left side weakness all the time. Sleep (in general) Good  Pain is worse with: No pain just left side weakness. Pain improves with: No pain. Relief from Meds: no pain.   Mobility use a walker ability to climb steps?  yes do you drive?  no  Function retired  Neuro/Psych trouble walking  Prior Studies Any changes since last visit?  no  Physicians involved in your care Any changes since last visit?  no   Family History  Problem Relation Age of Onset  . Colon cancer Mother   . Cancer Father   . Breast cancer Sister   . Breast cancer Sister    Social History   Socioeconomic History  . Marital status: Married    Spouse name: Not on file  . Number of children: Not on file  . Years of education: Not on file  . Highest education level: Not on file  Occupational History  . Not on file  Tobacco Use  . Smoking status: Former Smoker    Packs/day: 0.50    Years: 20.00    Pack years: 10.00    Types: Cigarettes  . Smokeless tobacco: Never Used  Vaping Use  . Vaping Use: Never used  Substance and Sexual Activity  . Alcohol  use: Yes    Alcohol/week: 8.0 standard drinks    Types: 8 Cans of beer per week    Comment: Weekends only  . Drug use: No  . Sexual activity: Not on file  Other Topics Concern  . Not on file  Social History Narrative   Married, no children.   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   Tob: 10 pack-yr hx (current as of 03/2014).   Alcohol: 8 beers and pint of whisky on weekends only.   Denies hx of prob with drugs or alcohol.   Social Determinants of Health   Financial Resource Strain: Not on file  Food Insecurity: Not on file  Transportation Needs: Not on file  Physical Activity: Not on file  Stress: Not on file  Social Connections: Not on file   Past Surgical History:  Procedure Laterality Date  . COLONOSCOPY  2005   Recall 10 yrs ([place])  . LOOP RECORDER INSERTION N/A 07/03/2019   Procedure: LOOP RECORDER INSERTION;  Surgeon: Thompson Grayer, MD;  Location: [facility];  Service: Cardiovascular;  Laterality: N/A;  . removal of kidney stones  2006   cystoscopic--removed from ureter.  No prob since.   Past Medical History:  Diagnosis Date  .  Alcohol abuse    drinks 1 gallon of Gin every weekend, nothing during the week x >15 yrs  . Elevated transaminase level    +elev bili: abd u/s 06/2014 showed stable small hepatic hemangiomas, o/w normal.  . Essential hypertension   . GERD (gastroesophageal reflux disease)   . Hyperlipidemia 03/2014   Atorv started-chol improved  . Impaired fasting glucose 03/2014  . Nephrolithiasis   . PUD (peptic ulcer disease)   . Stroke ([place])   . Tobacco dependence    chantix: "psych effects"   BP (!) 132/95   Pulse 84   Temp 98.7 F (37.1 C)   Ht 5\' 10"  (6.812 m)   Wt 187 lb 6.4 oz (85 kg)   SpO2 95%   BMI 26.89 kg/m   Opioid Risk Score:   Fall Risk Score:  `1  Depression screen PHQ 2/9  Depression screen [facility] 2/9 04/28/2020 01/07/2020 08/18/2019 08/17/2019 02/24/2019 12/21/2017  12/21/2016  Decreased Interest 0 0 0 1 0 0 0  Down, Depressed, Hopeless 0 0 0 0 0 0 0  PHQ - 2 Score 0 0 0 1 0 0 0  Altered sleeping - - - 0 - - -  Tired, decreased energy - - - 0 - - -  Change in appetite - - - 0 - - -  Feeling bad or failure about yourself  - - - 0 - - -  Trouble concentrating - - - 0 - - -  Moving slowly or fidgety/restless - - - 0 - - -  Suicidal thoughts - - - 0 - - -  PHQ-9 Score - - - 1 - - -    Review of Systems  Constitutional: Negative.   HENT: Negative.   Eyes: Negative.   Respiratory: Negative.   Cardiovascular: Negative.   Gastrointestinal: Negative.   Endocrine: Negative.   Genitourinary: Negative.   Musculoskeletal: Positive for gait problem. Negative for arthralgias and back pain.  Skin: Negative.   Allergic/Immunologic: Negative.   Neurological: Positive for weakness. Negative for dizziness, numbness and headaches.  Hematological: Negative.   Psychiatric/Behavioral: Negative.   All other systems reviewed and are negative.      Objective:   Physical Exam  Constitutional: No distress . Vital signs reviewed. HENT: Normocephalic.  Atraumatic. Eyes: EOMI. No discharge. Cardiovascular: No JVD.   Respiratory: Normal effort.  No stridor.   GI: Non-distended.   Skin: Warm and dry.  Intact. Psych: Normal mood.  Normal behavior. Musc: No edema in extremities.  No tenderness in extremities. Neuro: Alert Motor: Left upper extremity: Shoulder abduction 4+/5, distally 4+/5, with apraxia and ataxia Left lower extremity: Hip flexion 4+/5, knee extension 4+/5, ankle dorsiflexion 4+/5 Dysarthria,unchanged Left facial weakness, stable    Assessment & Plan:  Right-handed male with history of alcohol and tobacco use, hyperlipidemia presents for hospital follow-up after receiving CIR for multiple anterior and posterior right brain infarcts.  1.  Left side hemiparesis secondary to multiple scattered anterior and posterior right brain infarcts.  Status  post loop recorder             Therapies currently on hold, with plans to resume next month  Continue to follow up with Cards  D/c wrist orthosis given improved strength  2.  Blood pressure  Relatively controlled today  3.  History of tobacco and alcohol use.    Abstaining from tobacco  Started drinking 6 pack/week, educated on cessation again  4. Gait abnormality  Resume therapies next moth  Continue walker for safety

## 2020-05-02 ENCOUNTER — Other Ambulatory Visit: Payer: Self-pay | Admitting: Internal Medicine

## 2020-05-02 ENCOUNTER — Ambulatory Visit (INDEPENDENT_AMBULATORY_CARE_PROVIDER_SITE_OTHER): Payer: Medicare HMO

## 2020-05-02 ENCOUNTER — Other Ambulatory Visit: Payer: Self-pay

## 2020-05-02 DIAGNOSIS — I639 Cerebral infarction, unspecified: Secondary | ICD-10-CM

## 2020-05-02 NOTE — Telephone Encounter (Signed)
.   LAST APPOINTMENT DATE: 03/21/2020   NEXT APPOINTMENT DATE:@Visit  date not found  MEDICATION:atorvastatin (LIPITOR) 40 MG tablet   PHARMACY: [id number] - [place], [place] - [address]   Okay for refill?  Please advise

## 2020-05-03 ENCOUNTER — Other Ambulatory Visit: Payer: Self-pay

## 2020-05-03 MED ORDER — ATORVASTATIN CALCIUM 40 MG PO TABS: 40.0000 mg | ORAL_TABLET | Freq: Every day | ORAL | 1 refills | Status: DC

## 2020-05-03 NOTE — Telephone Encounter (Signed)
Pt's wife calling requesting a refill on atorvastatin. Dr. Rayann Heman did not prescribe this medication. Would Dr. Rayann Heman like to refill this medication? Please address

## 2020-05-03 NOTE — Addendum Note (Signed)
Addended by: Marian Sorrow on: 05/03/2020 11:25 AM   Modules accepted: Orders

## 2020-05-03 NOTE — Telephone Encounter (Signed)
Spoke to Mrs. Rabinovich told her Rx for Atorvastatin was sent to pharmacy for pt. Mrs. Karaffa verbalized understanding.

## 2020-05-03 NOTE — Telephone Encounter (Signed)
Pt requesting refill for atorvastatin. Cardiology will not fill said PCP needs to.

## 2020-05-03 NOTE — Telephone Encounter (Signed)
Spoke to pt's wife told her need to contact Cardiology due to they are managing his medications for his heart. We have not prescribed this for him. Mrs. Morones verbalized understanding and will call Cardiology.

## 2020-05-03 NOTE — Telephone Encounter (Signed)
Pt.'s wife called the cardiologist. They are refusing to fill it, because they say it is not a heart medication and that they did not fill it. They said that [facility] had filled it in 2020. Pt has one day left of pills

## 2020-05-06 DIAGNOSIS — R030 Elevated blood-pressure reading, without diagnosis of hypertension: Secondary | ICD-10-CM | POA: Diagnosis not present

## 2020-05-06 DIAGNOSIS — I639 Cerebral infarction, unspecified: Secondary | ICD-10-CM | POA: Diagnosis not present

## 2020-05-06 DIAGNOSIS — R509 Fever, unspecified: Secondary | ICD-10-CM | POA: Diagnosis not present

## 2020-05-06 DIAGNOSIS — Z720 Tobacco use: Secondary | ICD-10-CM | POA: Diagnosis not present

## 2020-05-06 DIAGNOSIS — E8809 Other disorders of plasma-protein metabolism, not elsewhere classified: Secondary | ICD-10-CM | POA: Diagnosis not present

## 2020-05-06 DIAGNOSIS — D72819 Decreased white blood cell count, unspecified: Secondary | ICD-10-CM | POA: Diagnosis not present

## 2020-05-06 DIAGNOSIS — A419 Sepsis, unspecified organism: Secondary | ICD-10-CM | POA: Diagnosis not present

## 2020-05-06 DIAGNOSIS — F1010 Alcohol abuse, uncomplicated: Secondary | ICD-10-CM | POA: Diagnosis not present

## 2020-05-06 DIAGNOSIS — R0989 Other specified symptoms and signs involving the circulatory and respiratory systems: Secondary | ICD-10-CM | POA: Diagnosis not present

## 2020-05-09 NOTE — Progress Notes (Signed)
[facility]link Summary Report / Loop Recorder 

## 2020-05-24 ENCOUNTER — Ambulatory Visit: Payer: Medicare HMO | Admitting: Occupational Therapy

## 2020-05-24 ENCOUNTER — Telehealth: Payer: Self-pay

## 2020-05-24 ENCOUNTER — Ambulatory Visit: Payer: Medicare HMO

## 2020-05-24 DIAGNOSIS — R2681 Unsteadiness on feet: Secondary | ICD-10-CM

## 2020-05-24 DIAGNOSIS — M6281 Muscle weakness (generalized): Secondary | ICD-10-CM

## 2020-05-24 DIAGNOSIS — I69352 Hemiplegia and hemiparesis following cerebral infarction affecting left dominant side: Secondary | ICD-10-CM

## 2020-05-24 DIAGNOSIS — R482 Apraxia: Secondary | ICD-10-CM

## 2020-05-24 DIAGNOSIS — R262 Difficulty in walking, not elsewhere classified: Secondary | ICD-10-CM

## 2020-05-24 NOTE — Telephone Encounter (Signed)
Dr. Posey Pronto,  Russell Russell is scheduled to be evaluated by Physical and Occupational Therapy on 06/07/20. Currently we do not have an order to resume therapy services, but I believe the patient would benefit from continued OT/PT Therapy services.   If you agree, please place an order for both Physical Therapy and Occupational Therapy in [facility] in [facility] or fax the order to [phone].  Thank you, Guillermina City, PT, [facility] [address] [place], [place]  [address] Phone:  [phone] Fax:  [phone]

## 2020-05-24 NOTE — Telephone Encounter (Signed)
Thanks! Ordered.

## 2020-06-03 DIAGNOSIS — D72819 Decreased white blood cell count, unspecified: Secondary | ICD-10-CM | POA: Diagnosis not present

## 2020-06-03 DIAGNOSIS — R0989 Other specified symptoms and signs involving the circulatory and respiratory systems: Secondary | ICD-10-CM | POA: Diagnosis not present

## 2020-06-03 DIAGNOSIS — F1010 Alcohol abuse, uncomplicated: Secondary | ICD-10-CM | POA: Diagnosis not present

## 2020-06-03 DIAGNOSIS — R509 Fever, unspecified: Secondary | ICD-10-CM | POA: Diagnosis not present

## 2020-06-03 DIAGNOSIS — A419 Sepsis, unspecified organism: Secondary | ICD-10-CM | POA: Diagnosis not present

## 2020-06-03 DIAGNOSIS — I639 Cerebral infarction, unspecified: Secondary | ICD-10-CM | POA: Diagnosis not present

## 2020-06-03 DIAGNOSIS — E8809 Other disorders of plasma-protein metabolism, not elsewhere classified: Secondary | ICD-10-CM | POA: Diagnosis not present

## 2020-06-03 DIAGNOSIS — Z720 Tobacco use: Secondary | ICD-10-CM | POA: Diagnosis not present

## 2020-06-03 DIAGNOSIS — R030 Elevated blood-pressure reading, without diagnosis of hypertension: Secondary | ICD-10-CM | POA: Diagnosis not present

## 2020-06-06 ENCOUNTER — Ambulatory Visit (INDEPENDENT_AMBULATORY_CARE_PROVIDER_SITE_OTHER): Payer: Medicare HMO

## 2020-06-06 DIAGNOSIS — I6381 Other cerebral infarction due to occlusion or stenosis of small artery: Secondary | ICD-10-CM

## 2020-06-06 LAB — CUP PACEART REMOTE DEVICE CHECK
Date Time Interrogation Session: 20220328063228
Implantable Pulse Generator Implant Date: 20210423

## 2020-06-07 ENCOUNTER — Ambulatory Visit: Payer: Medicare HMO | Admitting: Occupational Therapy

## 2020-06-07 ENCOUNTER — Encounter: Payer: Self-pay | Admitting: Occupational Therapy

## 2020-06-07 ENCOUNTER — Other Ambulatory Visit: Payer: Self-pay

## 2020-06-07 ENCOUNTER — Ambulatory Visit: Payer: Medicare HMO | Attending: Physician Assistant

## 2020-06-07 DIAGNOSIS — R2681 Unsteadiness on feet: Secondary | ICD-10-CM | POA: Insufficient documentation

## 2020-06-07 DIAGNOSIS — R41840 Attention and concentration deficit: Secondary | ICD-10-CM

## 2020-06-07 DIAGNOSIS — R262 Difficulty in walking, not elsewhere classified: Secondary | ICD-10-CM

## 2020-06-07 DIAGNOSIS — M6281 Muscle weakness (generalized): Secondary | ICD-10-CM | POA: Diagnosis not present

## 2020-06-07 DIAGNOSIS — R278 Other lack of coordination: Secondary | ICD-10-CM | POA: Insufficient documentation

## 2020-06-07 DIAGNOSIS — R482 Apraxia: Secondary | ICD-10-CM

## 2020-06-07 DIAGNOSIS — R2689 Other abnormalities of gait and mobility: Secondary | ICD-10-CM | POA: Diagnosis not present

## 2020-06-07 DIAGNOSIS — I69352 Hemiplegia and hemiparesis following cerebral infarction affecting left dominant side: Secondary | ICD-10-CM | POA: Diagnosis not present

## 2020-06-07 DIAGNOSIS — R208 Other disturbances of skin sensation: Secondary | ICD-10-CM | POA: Insufficient documentation

## 2020-06-07 NOTE — Therapy (Signed)
[facility] [address] [place], [name], [address] Phone: [phone]   Fax:  [phone]  Occupational Therapy Evaluation  Patient Details  Name: Russell Russell MRN: [id number] Date of Birth: [birth year] Referring Provider (OT): Delice Lesch   Encounter Date: 06/07/2020   OT End of Session - 06/07/20 1844    Visit Number 1    Number of Visits 9    Date for OT Re-Evaluation 08/21/20    Authorization Type [employer] - need auth    OT Start Time 1230    OT Stop Time 1315    OT Time Calculation (min) 45 min    Activity Tolerance Patient tolerated treatment well           Past Medical History:  Diagnosis Date  . Alcohol abuse    drinks 1 gallon of Gin every weekend, nothing during the week x >15 yrs  . Elevated transaminase level    +elev bili: abd u/s 06/2014 showed stable small hepatic hemangiomas, o/w normal.  . Essential hypertension   . GERD (gastroesophageal reflux disease)   . Hyperlipidemia 03/2014   Atorv started-chol improved  . Impaired fasting glucose 03/2014  . Nephrolithiasis   . PUD (peptic ulcer disease)   . Stroke ([place])   . Tobacco dependence    chantix: "psych effects"    Past Surgical History:  Procedure Laterality Date  . COLONOSCOPY  2005   Recall 10 yrs ([place])  . LOOP RECORDER INSERTION N/A 07/03/2019   Procedure: LOOP RECORDER INSERTION;  Surgeon: Thompson Grayer, MD;  Location: [facility];  Service: Cardiovascular;  Laterality: N/A;  . removal of kidney stones  2006   cystoscopic--removed from ureter.  No prob since.    There were no vitals filed for this visit.   Subjective Assessment - 06/07/20 1241    Subjective  Patient indicates that he would like to be able to pick things up better with his left hand    Patient is accompanied by: Family member    Pertinent History HTN, ETOH use, smoker, loop recorder    Currently in Pain? 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 Minimal assistance   lacks grab bars in current house     Vision - History   Baseline Vision Wears glasses all the time      Vision Assessment   Eye Alignment Within Functional Limits    Ocular Range of Motion Within Functional Limits      Cognition   Overall Cognitive Status Impaired/Different from baseline      Posture/Postural Control   Posture/Postural Control Postural limitations    Postural Limitations  Rounded Shoulders;Forward head;Posterior pelvic tilt;Flexed trunk    Posture Comments Patient with strong posterior bias, most pushing behaviors integrated in familiar environments/ situations      Sensation   Light Touch Appears Intact    Stereognosis Impaired by gross assessment    Proprioception Impaired by gross assessment      Coordination   9 Hole Peg Test Right;Left    Right 9 Hole Peg Test 36    Left 9 Hole Peg Test 1.06.16    Box and Blocks 22 left UE      Perception   Perception Not tested      Praxis   Praxis Impaired    Praxis Impairment Details Motor planning      Tone   Assessment Location Left Upper Extremity      ROM / Strength   AROM / PROM / Strength AROM;PROM      AROM   Overall AROM  Deficits    Overall AROM Comments 110 shoulder flex left - with trunk lat flex      PROM   Overall PROM  Deficits    Overall PROM Comments 110 shoulder  flexion then painful      Hand Function   Right Hand Gross Grasp Functional    Right Hand Grip (lbs) 70    Right Hand Lateral Pinch 20 lbs    Left Hand Gross Grasp Impaired    Left Hand Grip (lbs) 20    Left Hand Lateral Pinch 14 lbs      LUE Tone   LUE Tone Mild;Hypertonic      LUE Tone   Hypertonic Details elbow, forearm, wrist, digits                           OT Education - 06/07/20 1843    Education Details Results of evaluation compared to at time of discharge last year,  Potential goals and OT plan of care    Person(s) Educated Patient;Spouse    Methods Explanation    Comprehension Verbalized understanding            OT Short Term Goals - 06/07/20 1852      OT SHORT TERM GOAL #1   Title Patient will compelte a home exercise program designed to improve functional use of LUE due 5/13    Time 4    Period Weeks    Status New    Target Date 07/22/20      OT SHORT TERM GOAL #2   Title Patient will report decrease in pain with passive range of motion beyond 80 degrees of shoulder flexion    Time 4    Period Weeks    Status New      OT SHORT TERM GOAL #3   Title Patient will demonstrate 3 lb increase in grip strength in LUE    Baseline 20    Time 4    Period Weeks    Status New      OT SHORT TERM GOAL #4   Title XXX      OT SHORT TERM GOAL #5   Title XXX      OT SHORT TERM GOAL #6   Title XXX             OT Long Term Goals - 06/07/20 1854      OT LONG TERM GOAL #1   Title Patient will complete an updated HEP to address LUE functioning DUE 08/21/20  Time 8    Period Weeks    Status Achieved    Target Date 08/21/20      OT LONG TERM GOAL #2   Title Patient will transfer into shower with supervision    Baseline min assist    Time 8    Period Weeks    Status New      OT LONG TERM GOAL #3   Title Patient will demonstrate 10 lb increase in grip strength in LUE    Baseline 20    Time 8    Period Weeks    Status New       OT LONG TERM GOAL #4   Title Patient will demonstrate ability to manipulate small items within left hand with increased time, e.g. change    Time 8    Period Weeks    Status New      OT LONG TERM GOAL #5   Title Patient will demonstrate 120 degrees of active assisted shoulder flexion without report of increased pain    Time 8    Period Weeks    Status New      OT LONG TERM GOAL #6   Title xxx      OT LONG TERM GOAL #7   Title xxx      OT LONG TERM GOAL #8   Title xxx                 Plan - 06/07/20 1845    Clinical Impression Statement Patient is a 70 yr old man well known to this clinician from prior episode of care.  Patient returns to OT to furtherimprove functional use of LUE, and increase independence with ADL/IADL.  Patient has very supportive wife, and has made progress in several areas since his OT discharge.  Patient presents with pain in left shoudler, decreased grip in left hand, decreased coordination - fine motor and interlimb, decreased balance, decreased perceptual motor skills - apraxia, and decreased cognition - attnetion, awareness, insight.  Patient will benefot from skilled OT intervention to increase independence with ADL and increase participation in IADL.    OT Occupational Profile and History Detailed Assessment- Review of Records and additional review of physical, cognitive, psychosocial history related to current functional performance    Occupational performance deficits (Please refer to evaluation for details): ADL's;IADL's;Rest and Sleep;Leisure    Body Structure / Function / Physical Skills ADL;Coordination;Endurance;GMC;UE functional use;Sensation;Decreased knowledge of precautions;Balance;Body mechanics;Decreased knowledge of use of DME;Flexibility;IADL;Pain;Vision;Cardiopulmonary status limiting activity;Dexterity;FMC;Proprioception;Strength;Tone;ROM;Muscle spasms;Mobility    Cognitive Skills Attention;Energy/Drive;Learn;Sequencing;Safety  Awareness;Problem Solve;Perception    Rehab Potential Good    Clinical Decision Making Limited treatment options, no task modification necessary    Comorbidities Affecting Occupational Performance: May have comorbidities impacting occupational performance    Modification or Assistance to Complete Evaluation  No modification of tasks or assist necessary to complete eval    OT Frequency 1x / week    OT Duration 8 weeks    OT Treatment/Interventions Self-care/ADL training;Electrical Stimulation;Therapeutic exercise;Visual/perceptual remediation/compensation;Patient/family education;Splinting;Neuromuscular education;Moist Heat;Aquatic Therapy;Balance training;Therapeutic activities;Functional Mobility Training;Fluidtherapy;DME and/or AE instruction;Manual Therapy;Cognitive remediation/compensation;Passive range of motion;Contrast Bath;Ultrasound;Cryotherapy    Plan Grip strengthening -  redwas given prior, coordination HEP, address shoulder pain - Shoulder range and mechanics    OT Home Exercise Plan coordination HEP, putty red - grip and pinch - from prior episode of care    Consulted and Agree with Plan of Care Patient;Family member/caregiver    Family Member Consulted Wife, [name]  Patient will benefit from skilled therapeutic intervention in order to improve the following deficits and impairments:   Body Structure / Function / Physical Skills: ADL,Coordination,Endurance,GMC,UE functional use,Sensation,Decreased knowledge of precautions,Balance,Body mechanics,Decreased knowledge of use of DME,Flexibility,IADL,Pain,Vision,Cardiopulmonary status limiting activity,Dexterity,FMC,Proprioception,Strength,Tone,ROM,Muscle spasms,Mobility Cognitive Skills: Attention,Energy/Drive,Learn,Sequencing,Safety Awareness,Problem Solve,Perception     Visit Diagnosis: Apraxia - Plan: Ot plan of care cert/re-cert  Hemiplegia and hemiparesis following cerebral infarction affecting left dominant side ([place])  - Plan: Ot plan of care cert/re-cert  Other lack of coordination - Plan: Ot plan of care cert/re-cert  Attention and concentration deficit - Plan: Ot plan of care cert/re-cert  Other disturbances of skin sensation - Plan: Ot plan of care cert/re-cert  Unsteadiness on feet - Plan: Ot plan of care cert/re-cert  Muscle weakness (generalized) - Plan: Ot plan of care cert/re-cert    Problem List Patient Active Problem List   Diagnosis Date Noted  . H/O ETOH abuse 04/28/2020  . History of tobacco abuse 01/07/2020  . Hemiparesis affecting left side as late effect of stroke ([place]) 01/07/2020  . Abnormality of gait 09/24/2019  . AKI (acute kidney injury) ([place])   . Fever   . Sepsis without acute organ dysfunction ([place])   . Elevated BUN   . Blood pressure increase diastolic   . Labile blood pressure   . Leukopenia   . Benign essential HTN   . Hypoalbuminemia   . Cerebrovascular accident (CVA) of right basal ganglia ([place]) 07/07/2019  . Acute ischemic stroke ([place])   . Dyslipidemia   . Dysphagia, post-stroke   . Polycythemia   . Stroke ([place]) 06/30/2019  . Prediabetes 12/23/2016  . Elevated LFTs 12/23/2016  . Pure hypercholesterolemia 12/21/2016  . Alcoholism ([place]) 12/21/2016  . Eczema 09/18/2013  . Essential hypertension 09/18/2013  . Gastroesophageal reflux disease without esophagitis 09/18/2013  . Tobacco abuse 09/18/2013    Mariah Milling, OTR/L 06/07/2020, 7:01 PM  [facility] [address] [place], [name], [address] Phone: [phone]   Fax:  [phone]  Name: Herberto Ledwell MRN: [id number] Date of Birth: [birth year]

## 2020-06-07 NOTE — Therapy (Signed)
[facility] [address] [place], [name], [address] Phone: [phone]   Fax:  [phone]  Physical Therapy Evaluation  Patient Details  Name: Russell Russell MRN: [id number] Date of Birth: [birth year] Referring Provider (PT): Jamse Arn, MD   Encounter Date: 06/07/2020   PT End of Session - 06/07/20 1502    Visit Number 1    Number of Visits 9    Date for PT Re-Evaluation 08/06/20   POC for 8 weeks, Cert for 60 days   Authorization Type [employer] (medicare guidelines, 10th Visit PN)    Progress Note Due on Visit 10    PT Start Time 1147    PT Stop Time 1230    PT Time Calculation (min) 43 min    Equipment Utilized During Treatment Gait belt    Activity Tolerance Patient tolerated treatment well    Behavior During Therapy [facility] for tasks assessed/performed           Past Medical History:  Diagnosis Date  . Alcohol abuse    drinks 1 gallon of Gin every weekend, nothing during the week x >15 yrs  . Elevated transaminase level    +elev bili: abd u/s 06/2014 showed stable small hepatic hemangiomas, o/w normal.  . Essential hypertension   . GERD (gastroesophageal reflux disease)   . Hyperlipidemia 03/2014   Atorv started-chol improved  . Impaired fasting glucose 03/2014  . Nephrolithiasis   . PUD (peptic ulcer disease)   . Stroke ([place])   . Tobacco dependence    chantix: "psych effects"    Past Surgical History:  Procedure Laterality Date  . COLONOSCOPY  2005   Recall 10 yrs ([place])  . LOOP RECORDER INSERTION N/A 07/03/2019   Procedure: LOOP RECORDER INSERTION;  Surgeon: Thompson Grayer, MD;  Location: [facility];  Service: Cardiovascular;  Laterality: N/A;  . removal of kidney stones  2006   cystoscopic--removed from ureter.  No prob since.    There were no vitals filed for this visit.    Subjective Assessment - 06/07/20 1152    Subjective Patient reports that he has been walking  indoors/outdoors with RW. Is no longer using the manual wheelchair, is trying to ambulate everywhere he needs to go. Reports no falls, but few near misses due to catching the L toe Patient reports that the L leg has been getting stuck/difficulty moving it after sitting for an extended period of time like riding in the car. Patietn reports that he feels the L leg is heavy and has had times that he has caught the toe. No difficulty noted with mobility in the home currently, but will be moving into new home in a few weeks.    Patient is accompained by: Family member   Wife   Pertinent History PMH: HTN, alcohol abuse (ongoing use), HLD, GERD, PUD, tobacco use    Limitations Standing;Walking;House hold activities    Patient Stated Goals improve strength of L Leg; Be able to walk without dragging LLE    Currently in Pain? Yes    Pain Score 7     Pain Location Finger (Comment which one)    Pain Orientation Left    Pain Descriptors / Indicators Aching    Pain Type Chronic pain    Pain Onset More than a month ago    Pain Frequency Constant              [facility] PT Assessment - 06/07/20 1157  Assessment   Medical Diagnosis R Basal Ganglia CVA    Referring Provider (PT) Jamse Arn, MD    Onset Date/Surgical Date 06/30/19   date of CVA   Hand Dominance Right    Prior Therapy Known to prior OP Neuro      Precautions   Precautions Fall    Precaution Comments Loop Recorder      Restrictions   Weight Bearing Restrictions No      Balance Screen   Has the patient fallen in the past 6 months No    Has the patient had a decrease in activity level because of a fear of falling?  No    Is the patient reluctant to leave their home because of a fear of falling?  No      Home Environment   Living Environment Private residence    Living Arrangements Spouse/significant other    Available Help at Discharge Family    Type of [place] to enter    Entrance Stairs-Number  of Steps [place] One level    [place] - 2 wheels    Additional Comments this is expected as they are about to move into new home.      Prior Function   Level of Independence Independent with household mobility with device;Independent with community mobility with device    Vocation Retired    Leisure likes to relax, visit with family      Sensation   Light Touch Appears Intact      Coordination   Gross Motor Movements are Fluid and Coordinated No    Coordination and Movement Description gross uncoordination noted on LLE      Posture/Postural Control   Posture/Postural Control Postural limitations    Postural Limitations Rounded Shoulders;Forward head;Increased thoracic kyphosis;Posterior pelvic tilt;Weight shift left      Tone   Assessment Location Right Lower Extremity;Left Lower Extremity      ROM / Strength   AROM / PROM / Strength Strength      Strength   Overall Strength Deficits    Strength Assessment Site Hip;Knee;Ankle    Right/Left Hip Right;Left    Right Hip Flexion 4+/5    Right Hip ABduction 4/5    Left Hip Flexion 3+/5    Left Hip ABduction 3/5    Right/Left Knee Right;Left    Right Knee Flexion 4+/5    Right Knee Extension 4+/5    Left Knee Flexion 3+/5    Left Knee Extension 3/5    Right/Left Ankle Right;Left    Right Ankle Dorsiflexion 4/5    Left Ankle Dorsiflexion 3+/5      Bed Mobility   Bed Mobility Not assessed   reports independence with all aspects of bed mobility     Transfers   Transfers Sit to Stand;Stand to Sit    Sit to Stand 5: Supervision    Stand to Sit 5: Supervision    Stand Pivot Transfers 5: Supervision    Comments patient inconsistently reaching back for surface prior to descent. with stnad pivot transfer from mat <> chair, difficulty noted with sequnecing/motor planning.      Ambulation/Gait   Ambulation/Gait Yes    Ambulation/Gait Assistance 5: Supervision;4: Min  guard    Ambulation/Gait Assistance Details compelted ambulation throughout therapy gym with RW, continue to demo increased visual dependence on floor promoting abnormal posture during ambulation. increased challenge  noted with crowded/narrow walkway due to motor planning deficits.    Ambulation Distance (Feet) 115 Feet    Assistive device Rolling walker    Gait Pattern Step-through pattern;Step-to pattern;Decreased stride length;Decreased stance time - right;Decreased step length - left    Ambulation Surface Level;Indoor    Gait velocity 28.76 secs = 1.14 ft/sec   with RW     Standardized Balance Assessment   Standardized Balance Assessment Berg Balance Test;Timed Up and Go Test      Berg Balance Test   Sit to Stand Able to stand  independently using hands    Standing Unsupported Able to stand safely 2 minutes    Sitting with Back Unsupported but Feet Supported on Floor or Stool Able to sit safely and securely 2 minutes    Stand to Sit Controls descent by using hands    Transfers Able to transfer safely, definite need of hands    Standing Unsupported with Eyes Closed Able to stand 10 seconds with supervision    Standing Unsupported with Feet Together Able to place feet together independently and stand for 1 minute with supervision    From Standing, Reach Forward with Outstretched Arm Can reach forward >12 cm safely (5")    From Standing Position, Pick up Object from Floor Able to pick up shoe, needs supervision    From Standing Position, Turn to Look Behind Over each Shoulder Turn sideways only but maintains balance    Turn 360 Degrees Needs close supervision or verbal cueing    Standing Unsupported, Alternately Place Feet on Step/Stool Able to complete >2 steps/needs minimal assist    Standing Unsupported, One Foot in Front Able to take small step independently and hold 30 seconds    Standing on One Leg Tries to lift leg/unable to hold 3 seconds but remains standing independently    Total  Score 36    Berg comment: 36/56 = High Risk for Falls      Timed Up and Go Test   TUG Normal TUG    Normal TUG (seconds) 37.53   with RW     RLE Tone   RLE Tone Within Functional Limits      LLE Tone   LLE Tone Mild;Hypertonic            Objective measurements completed on examination: See above findings.      PT Education - 06/07/20 1459    Education Details Educated on [employer].    Person(s) Educated Patient;Spouse    Methods Explanation    Comprehension Verbalized understanding            PT Short Term Goals - 06/07/20 1608      PT SHORT TERM GOAL #1   Title Patient will improve TUG to </= 34 seconds with RW to demo reduced fall risk (All STGs Due: 07/05/20)    Baseline 37.53    Time 4    Period Weeks    Status New    Target Date 07/05/20      PT SHORT TERM GOAL #2   Title Patient will improve Berg Balance to >/= 39/56 to demonstrate improved balance and reduced fall risk    Baseline 36/56    Time 4    Period Weeks    Status New      PT SHORT TERM GOAL #3   Title Patient will improve gait speed to >/= 1.25 ft/sec with LRAD to demo improved mobility    Baseline 1.14 ft/sec  Time 4    Period Weeks    Status New      PT SHORT TERM GOAL #4   Title Patient will be independent with initial HEP for strengthening/balance    Baseline HEP established at prior POC    Time 4    Period Weeks    Status New             PT Long Term Goals - 06/07/20 1611      PT LONG TERM GOAL #1   Title Patient will be independent with final strengthening/balance HEP and report completing walking program >/= 20 minutes daily(ALL LTG Due: 08/02/20)    Baseline HEP established at prior POC    Time 8    Period Weeks    Status New    Target Date 08/02/20      PT LONG TERM GOAL #2   Title patient will improve gait speed to >/= 1.4 ft/sec to demonstrate improved community mobility    Baseline 1.14 ft/sec    Time 8    Period Weeks    Status New      PT  LONG TERM GOAL #3   Title Patient will demo ability to ambulate >100 ft on outdoor/unlevel surfaces with supervision to demo improved community mobility    Baseline TBA    Time 8    Period Weeks    Status New      PT LONG TERM GOAL #4   Title Patient will improve TUG to </= 28 seconds to demonstrate improved mobility and reduced fall risk    Baseline 37.53 secs    Time 8    Period Weeks    Status New      PT LONG TERM GOAL #5   Title Patient will improve Berg Balance to >/= 45/56 to demonstrate reduced risk for falls    Baseline 36/56    Time 8    Period Weeks    Status New                  Plan - 06/07/20 1616    Clinical Impression Statement Pt is 70 y/o male referred to Neuro OPPT for CVA that occurred on 06/30/2019, patient known to this clinic from prior therapy services (PT/OT/ST). Patients PMH is significant for: HTN, alcohol abuse (ongoing use), HLD, GERD, PUD, and tobacco use. Pt presents with the following impairments upon evaluation: abnormal posture, decreased strength, impaired functional mobility, impaired balance, abnormal tone and increased fall risk. Patient scored 36/56 on Berg Balance and completed TUG in 37.53 secs with RW, both demonstrating increased risk for falls. Patient continues to use RW for mobility indoors and within the community. Patient will benefit from skilled PT services to address impairments stated above, maximize functional mobility, and reduce fall risk.    Personal Factors and Comorbidities Comorbidity 3+    Comorbidities HTN, alcohol abuse (ongoing use), HLD, GERD, PUD, tobacco use    Examination-Activity Limitations Bed Mobility;Dressing;Locomotion Level;Stairs;Stand;Toileting;Transfers;Bathing    Examination-Participation Restrictions Driving;Interpersonal Relationship;Yard Work;Community Activity    Stability/Clinical Decision Making Evolving/Moderate complexity    Clinical Decision Making Moderate    Rehab Potential Good    PT  Frequency 1x / week    PT Duration 8 weeks    PT Treatment/Interventions ADLs/Self Care Home Management;Electrical Stimulation;DME Instruction;Moist Heat;Cryotherapy;Gait training;Stair training;Functional mobility training;Therapeutic activities;Therapeutic exercise;Balance training;Neuromuscular re-education;Patient/family education;Orthotic Fit/Training;Wheelchair mobility training;Passive range of motion;Aquatic Therapy;Vestibular;Manual techniques;Dry needling;Joint Manipulations;Spinal Manipulations    PT Next Visit Plan Update HEP from prior POC.  Potential for Aquatic Therapy? Work on standing balance working toward narrow [employer] and Agree with Plan of Care Patient;Family member/caregiver    Family Member Consulted Wife           Patient will benefit from skilled therapeutic intervention in order to improve the following deficits and impairments:  Abnormal gait,Decreased coordination,Difficulty walking,Impaired tone,Decreased safety awareness,Decreased endurance,Decreased activity tolerance,Pain,Decreased balance,Decreased knowledge of use of DME,Postural dysfunction,Decreased strength,Decreased mobility  Visit Diagnosis: Difficulty in walking, not elsewhere classified  Muscle weakness (generalized)  Other abnormalities of gait and mobility  Unsteadiness on feet     Problem List Patient Active Problem List   Diagnosis Date Noted  . H/O ETOH abuse 04/28/2020  . History of tobacco abuse 01/07/2020  . Hemiparesis affecting left side as late effect of stroke ([place]) 01/07/2020  . Abnormality of gait 09/24/2019  . AKI (acute kidney injury) ([place])   . Fever   . Sepsis without acute organ dysfunction ([place])   . Elevated BUN   . Blood pressure increase diastolic   . Labile blood pressure   . Leukopenia   . Benign essential HTN   . Hypoalbuminemia   . Cerebrovascular accident (CVA) of right basal ganglia ([place]) 07/07/2019  . Acute ischemic stroke ([place])   . Dyslipidemia    . Dysphagia, post-stroke   . Polycythemia   . Stroke ([place]) 06/30/2019  . Prediabetes 12/23/2016  . Elevated LFTs 12/23/2016  . Pure hypercholesterolemia 12/21/2016  . Alcoholism ([place]) 12/21/2016  . Eczema 09/18/2013  . Essential hypertension 09/18/2013  . Gastroesophageal reflux disease without esophagitis 09/18/2013  . Tobacco abuse 09/18/2013    Jones Bales, PT, DPT 06/07/2020, 4:21 PM  [facility] [address] [place], [name], [address] Phone: [phone]   Fax:  [phone]  Name: Russell Russell MRN: [id number] Date of Birth: 10-10-1950

## 2020-06-15 ENCOUNTER — Other Ambulatory Visit: Payer: Self-pay

## 2020-06-15 ENCOUNTER — Ambulatory Visit: Payer: Medicare HMO | Attending: Physician Assistant

## 2020-06-15 DIAGNOSIS — R2689 Other abnormalities of gait and mobility: Secondary | ICD-10-CM | POA: Diagnosis not present

## 2020-06-15 DIAGNOSIS — R262 Difficulty in walking, not elsewhere classified: Secondary | ICD-10-CM

## 2020-06-15 DIAGNOSIS — M6281 Muscle weakness (generalized): Secondary | ICD-10-CM | POA: Diagnosis not present

## 2020-06-15 DIAGNOSIS — R2681 Unsteadiness on feet: Secondary | ICD-10-CM | POA: Diagnosis not present

## 2020-06-15 DIAGNOSIS — R482 Apraxia: Secondary | ICD-10-CM | POA: Insufficient documentation

## 2020-06-15 DIAGNOSIS — R278 Other lack of coordination: Secondary | ICD-10-CM | POA: Insufficient documentation

## 2020-06-15 DIAGNOSIS — I69352 Hemiplegia and hemiparesis following cerebral infarction affecting left dominant side: Secondary | ICD-10-CM | POA: Insufficient documentation

## 2020-06-15 DIAGNOSIS — R208 Other disturbances of skin sensation: Secondary | ICD-10-CM | POA: Insufficient documentation

## 2020-06-15 DIAGNOSIS — R41840 Attention and concentration deficit: Secondary | ICD-10-CM | POA: Diagnosis not present

## 2020-06-15 NOTE — Therapy (Signed)
[facility] [address] [place], [name], [address] Phone: [phone]   Fax:  [phone]  Physical Therapy Treatment  Patient Details  Name: Russell Russell MRN: [id number] Date of Birth: Apr 03, 1950 Referring Provider (PT): Jamse Arn, MD   Encounter Date: 06/15/2020   PT End of Session - 06/15/20 1231    Visit Number 2    Number of Visits 9    Date for PT Re-Evaluation 08/06/20   POC for 8 weeks, Cert for 60 days   Authorization Type [employer] (medicare guidelines, 10th Visit PN)    Progress Note Due on Visit 10    PT Start Time 1231    PT Stop Time 1315    PT Time Calculation (min) 44 min    Equipment Utilized During Treatment Gait belt    Activity Tolerance Patient tolerated treatment well    Behavior During Therapy [facility] for tasks assessed/performed           Past Medical History:  Diagnosis Date  . Alcohol abuse    drinks 1 gallon of Gin every weekend, nothing during the week x >15 yrs  . Elevated transaminase level    +elev bili: abd u/s 06/2014 showed stable small hepatic hemangiomas, o/w normal.  . Essential hypertension   . GERD (gastroesophageal reflux disease)   . Hyperlipidemia 03/2014   Atorv started-chol improved  . Impaired fasting glucose 03/2014  . Nephrolithiasis   . PUD (peptic ulcer disease)   . Stroke ([place])   . Tobacco dependence    chantix: "psych effects"    Past Surgical History:  Procedure Laterality Date  . COLONOSCOPY  2005   Recall 10 yrs ([place])  . LOOP RECORDER INSERTION N/A 07/03/2019   Procedure: LOOP RECORDER INSERTION;  Surgeon: Thompson Grayer, MD;  Location: [facility];  Service: Cardiovascular;  Laterality: N/A;  . removal of kidney stones  2006   cystoscopic--removed from ureter.  No prob since.    There were no vitals filed for this visit.   Subjective Assessment - 06/15/20 1234    Subjective No new changes/complaints. No falls. No pain  today.    Patient is accompained by: Family member   Wife   Pertinent History PMH: HTN, alcohol abuse (ongoing use), HLD, GERD, PUD, tobacco use    Limitations Standing;Walking;House hold activities    Patient Stated Goals improve strength of L Leg; Be able to walk without dragging LLE    Currently in Pain? No/denies    Pain Onset More than a month ago             [facility] Adult PT Treatment/Exercise - 06/15/20 0001      Transfers   Transfers Sit to Stand;Stand to Sit    Sit to Stand 5: Supervision    Five time sit to stand comments  22.31 secs with BUE support; increased extension noted upon standing in BLE, CGA for balance.    Stand to Sit 5: Supervision      Ambulation/Gait   Ambulation/Gait Yes    Ambulation/Gait Assistance 5: Supervision    Ambulation/Gait Assistance Details ambulation throughout clinic with RW. cues for improved step length noted bilaterally    Ambulation Distance (Feet) --   clinic distances   Assistive device Rolling walker    Gait Pattern Step-through pattern;Step-to pattern;Decreased stride length;Decreased stance time - right;Decreased step length - left    Ambulation Surface Level;Indoor      Exercises   Exercises Knee/Hip  Other Exercises  verbal review of current HEP; patient reports exercise continue to be challenging. will progres as needed in future sessions.      Knee/Hip Exercises: Aerobic   Other Aerobic Completed SciFit with BLE only on Level 2.0, goal of RPM > 35 x 5 minutes for improved recirpocal motion/strengthening.            Balance Exercises - 06/15/20 0001      Balance Exercises: Standing   Standing Eyes Opened Narrow base of support (BOS);Head turns;Foam/compliant surface;Limitations;Wide (BOA)    Standing Eyes Opened Limitations standing with eyes open and narrow BOS compelted horizontal/vertical head turns x 10 reps each direction. with wide BOS: completed standing eyes open 2 x 30 seconds, then progresed to alternating OH  shoulder flexion x 10 reps. CGA.    Rockerboard Anterior/posterior;Lateral;EO;Limitations;Intermittent UE support    Rockerboard Limitations standing on rockerboard positioned A/P: completed static standing with intermittent UE support working on improved static standing 2 x 30 seconds and holding board steady, then progressed to A/P weight shift x 10 reps, PT providing tactile cues. with board positioned laterally: completed static standing with light UE support  2 x 30 seconds. then progressed to lateral weight shift x 10 reps.    Sidestepping 2 reps;Limitations    Sidestepping Limitations in // bars with BUE support completed lateral side stepping down and back x 2 laps. verbal cues for foot position needed.    Other Standing Exercises standing with staggered stance and no UE support; completed static standing with alternating feet position x 30 seconds each. then worked on A/P weight shift x 10 reps. manual faciliaton required for proper weight shift. Paitent demo difficulty with initiating weight shift through pelvis, often wanting to flex trunk with completion. Then alternating foot positoin and completed x 10 reps. Patient require very frequent verbal cues for posture as often wanting to forward lean throughout activities.          Verbal review of current HEP, no changes completed today due to patient reports of exercises continue to provide challenge.   Access Code: [facility] URL: [email or web address] Date: 06/15/2020 Prepared by: Baldomero Lamy  Exercises Seated Long Arc Quad - 1 x daily - 5 x weekly - 2 sets - 10 reps Seated Ankle Dorsiflexion with Resistance - 1 x daily - 5 x weekly - 2 sets - 10 reps Seated Hamstring Curl with Anchored Resistance - 1 x daily - 5 x weekly - 2 sets - 10 reps Supine Active Straight Leg Raise - 1 x daily - 5 x weekly - 2 sets - 10 reps Side Stepping with Counter Support - 1 x daily - 5 x weekly - 2 sets - 10 reps Standing Marching - 1 x  daily - 5 x weekly - 2 sets - 10 reps Mini Squat with Counter Support - 1 x daily - 5 x weekly - 2 sets - 10 reps Clamshell with Resistance - 1 x daily - 5 x weekly - 2 sets - 10 reps Seated Heel Raise - 1 x daily - 5 x weekly - 2 sets - 15 reps     PT Short Term Goals - 06/07/20 1608      PT SHORT TERM GOAL #1   Title Patient will improve TUG to </= 34 seconds with RW to demo reduced fall risk (All STGs Due: 07/05/20)    Baseline 37.53    Time 4    Period Weeks    Status New  Target Date 07/05/20      PT SHORT TERM GOAL #2   Title Patient will improve Berg Balance to >/= 39/56 to demonstrate improved balance and reduced fall risk    Baseline 36/56    Time 4    Period Weeks    Status New      PT SHORT TERM GOAL #3   Title Patient will improve gait speed to >/= 1.25 ft/sec with LRAD to demo improved mobility    Baseline 1.14 ft/sec    Time 4    Period Weeks    Status New      PT SHORT TERM GOAL #4   Title Patient will be independent with initial HEP for strengthening/balance    Baseline HEP established at prior POC    Time 4    Period Weeks    Status New             PT Long Term Goals - 06/07/20 1611      PT LONG TERM GOAL #1   Title Patient will be independent with final strengthening/balance HEP and report completing walking program >/= 20 minutes daily(ALL LTG Due: 08/02/20)    Baseline HEP established at prior POC    Time 8    Period Weeks    Status New    Target Date 08/02/20      PT LONG TERM GOAL #2   Title patient will improve gait speed to >/= 1.4 ft/sec to demonstrate improved community mobility    Baseline 1.14 ft/sec    Time 8    Period Weeks    Status New      PT LONG TERM GOAL #3   Title Patient will demo ability to ambulate >100 ft on outdoor/unlevel surfaces with supervision to demo improved community mobility    Baseline TBA    Time 8    Period Weeks    Status New      PT LONG TERM GOAL #4   Title Patient will improve TUG to </= 28  seconds to demonstrate improved mobility and reduced fall risk    Baseline 37.53 secs    Time 8    Period Weeks    Status New      PT LONG TERM GOAL #5   Title Patient will improve Berg Balance to >/= 45/56 to demonstrate reduced risk for falls    Baseline 36/56    Time 8    Period Weeks    Status New                 Plan - 06/15/20 1319    Clinical Impression Statement Today's skilled PT session included assesment of 5x sit <> stand with patient able to complete in seconds with UE support in 22.31 secs. Rest of session spent on balance activites on complaint surfaces, narrow BOS, and weight shift activiites. Increased tactile/verbal cues and manual faciliation for proper weight shift and posture. overall patient tolerating well. Will conitnue to progress toward all LTGs.    Personal Factors and Comorbidities Comorbidity 3+    Comorbidities HTN, alcohol abuse (ongoing use), HLD, GERD, PUD, tobacco use    Examination-Activity Limitations Bed Mobility;Dressing;Locomotion Level;Stairs;Stand;Toileting;Transfers;Bathing    Examination-Participation Restrictions Driving;Interpersonal Relationship;Yard Work;Community Activity    Stability/Clinical Decision Making Evolving/Moderate complexity    Rehab Potential Good    PT Frequency 1x / week    PT Duration 8 weeks    PT Treatment/Interventions ADLs/Self Care Home Management;Electrical Stimulation;DME Instruction;Moist Heat;Cryotherapy;Gait training;Stair training;Functional mobility training;Therapeutic  activities;Therapeutic exercise;Balance training;Neuromuscular re-education;Patient/family education;Orthotic Fit/Training;Wheelchair mobility training;Passive range of motion;Aquatic Therapy;Vestibular;Manual techniques;Dry needling;Joint Manipulations;Spinal Manipulations    PT Next Visit Plan Potential for Aquatic Therapy? Work on standing balance working toward narrow BOS. gait outdoors. standing weight shift activities    Consulted and  Agree with Plan of Care Patient;Family member/caregiver    Family Member Consulted Wife           Patient will benefit from skilled therapeutic intervention in order to improve the following deficits and impairments:  Abnormal gait,Decreased coordination,Difficulty walking,Impaired tone,Decreased safety awareness,Decreased endurance,Decreased activity tolerance,Pain,Decreased balance,Decreased knowledge of use of DME,Postural dysfunction,Decreased strength,Decreased mobility  Visit Diagnosis: Unsteadiness on feet  Muscle weakness (generalized)  Other abnormalities of gait and mobility  Difficulty in walking, not elsewhere classified     Problem List Patient Active Problem List   Diagnosis Date Noted  . H/O ETOH abuse 04/28/2020  . History of tobacco abuse 01/07/2020  . Hemiparesis affecting left side as late effect of stroke ([place]) 01/07/2020  . Abnormality of gait 09/24/2019  . AKI (acute kidney injury) ([place])   . Fever   . Sepsis without acute organ dysfunction ([place])   . Elevated BUN   . Blood pressure increase diastolic   . Labile blood pressure   . Leukopenia   . Benign essential HTN   . Hypoalbuminemia   . Cerebrovascular accident (CVA) of right basal ganglia ([place]) 07/07/2019  . Acute ischemic stroke ([place])   . Dyslipidemia   . Dysphagia, post-stroke   . Polycythemia   . Stroke ([place]) 06/30/2019  . Prediabetes 12/23/2016  . Elevated LFTs 12/23/2016  . Pure hypercholesterolemia 12/21/2016  . Alcoholism ([place]) 12/21/2016  . Eczema 09/18/2013  . Essential hypertension 09/18/2013  . Gastroesophageal reflux disease without esophagitis 09/18/2013  . Tobacco abuse 09/18/2013    Jones Bales, PT, DPT 06/15/2020, 1:20 PM  [facility] [address] [place], [name], [address] Phone: [phone]   Fax:  [phone]  Name: Russell Russell MRN: [id number] Date of Birth: 10-01-1950

## 2020-06-17 NOTE — Progress Notes (Signed)
[facility]link Summary Report / Loop Recorder 

## 2020-06-21 ENCOUNTER — Ambulatory Visit: Payer: Medicare HMO | Admitting: Occupational Therapy

## 2020-06-21 ENCOUNTER — Other Ambulatory Visit: Payer: Self-pay

## 2020-06-21 ENCOUNTER — Encounter: Payer: Self-pay | Admitting: Physical Therapy

## 2020-06-21 ENCOUNTER — Encounter: Payer: Self-pay | Admitting: Occupational Therapy

## 2020-06-21 ENCOUNTER — Ambulatory Visit: Payer: Medicare HMO | Admitting: Physical Therapy

## 2020-06-21 DIAGNOSIS — R208 Other disturbances of skin sensation: Secondary | ICD-10-CM | POA: Diagnosis not present

## 2020-06-21 DIAGNOSIS — R2689 Other abnormalities of gait and mobility: Secondary | ICD-10-CM

## 2020-06-21 DIAGNOSIS — I69352 Hemiplegia and hemiparesis following cerebral infarction affecting left dominant side: Secondary | ICD-10-CM | POA: Diagnosis not present

## 2020-06-21 DIAGNOSIS — M6281 Muscle weakness (generalized): Secondary | ICD-10-CM

## 2020-06-21 DIAGNOSIS — R262 Difficulty in walking, not elsewhere classified: Secondary | ICD-10-CM | POA: Diagnosis not present

## 2020-06-21 DIAGNOSIS — R41840 Attention and concentration deficit: Secondary | ICD-10-CM

## 2020-06-21 DIAGNOSIS — R482 Apraxia: Secondary | ICD-10-CM

## 2020-06-21 DIAGNOSIS — R278 Other lack of coordination: Secondary | ICD-10-CM | POA: Diagnosis not present

## 2020-06-21 DIAGNOSIS — R2681 Unsteadiness on feet: Secondary | ICD-10-CM

## 2020-06-21 NOTE — Therapy (Signed)
[facility] [address] [place], [name], [address] Phone: [phone]   Fax:  [phone]  Occupational Therapy Treatment  Patient Details  Name: Russell Russell MRN: [id number] Date of Birth: 1951-01-07 Referring Provider (OT): Delice Lesch   Encounter Date: 06/21/2020   OT End of Session - 06/21/20 1416    Visit Number 2    Number of Visits 9    Date for OT Re-Evaluation 08/21/20    Authorization Type [employer] - need auth    OT Start Time 1315    OT Stop Time 1358    OT Time Calculation (min) 43 min    Activity Tolerance Patient tolerated treatment well    Behavior During Therapy [facility] for tasks assessed/performed           Past Medical History:  Diagnosis Date  . Alcohol abuse    drinks 1 gallon of Gin every weekend, nothing during the week x >15 yrs  . Elevated transaminase level    +elev bili: abd u/s 06/2014 showed stable small hepatic hemangiomas, o/w normal.  . Essential hypertension   . GERD (gastroesophageal reflux disease)   . Hyperlipidemia 03/2014   Atorv started-chol improved  . Impaired fasting glucose 03/2014  . Nephrolithiasis   . PUD (peptic ulcer disease)   . Stroke ([place])   . Tobacco dependence    chantix: "psych effects"    Past Surgical History:  Procedure Laterality Date  . COLONOSCOPY  2005   Recall 10 yrs ([place])  . LOOP RECORDER INSERTION N/A 07/03/2019   Procedure: LOOP RECORDER INSERTION;  Surgeon: Thompson Grayer, MD;  Location: [facility];  Service: Cardiovascular;  Laterality: N/A;  . removal of kidney stones  2006   cystoscopic--removed from ureter.  No prob since.    There were no vitals filed for this visit.                 OT Treatments/Exercises (OP) - 06/21/20 1409      ADLs   ADL Comments Reviewed short and long term goals.  Patient and wife in agreement.      Neurological Re-education Exercises   Other Exercises 1 Neurmuscular  reeducation to address shoulder mechanics for mid to high reach.  Patient with limited passive range beyond 130 degrees of shoulder flexion / abduction.  Patient with excessive activation of anterior shoulder musculature and decreased ability to feel and modify.  Patient pulling [place] head forward with all attempts to reach arm up or straighten elbow.  Initiated supine ball exercises - not yet sent home with patient as further practice warranted.                  OT Education - 06/21/20 1416    Education Details OT goals    Person(s) Educated Patient;Spouse    Methods Explanation    Comprehension Verbalized understanding            OT Short Term Goals - 06/21/20 1419      OT SHORT TERM GOAL #1   Title Patient will compelte a home exercise program designed to improve functional use of LUE due 5/13    Time 4    Period Weeks    Status On-going    Target Date 07/22/20      OT SHORT TERM GOAL #2   Title Patient will report decrease in pain with passive range of motion beyond 80 degrees of shoulder flexion    Time 4  Period Weeks    Status On-going      OT SHORT TERM GOAL #3   Title Patient will demonstrate 3 lb increase in grip strength in LUE    Baseline 20    Time 4    Period Weeks    Status On-going      OT SHORT TERM GOAL #4   Title XXX      OT SHORT TERM GOAL #5   Title XXX      OT SHORT TERM GOAL #6   Title XXX             OT Long Term Goals - 06/21/20 1320      OT LONG TERM GOAL #1   Title Patient will complete an updated HEP to address LUE functioning DUE 08/21/20    Time 8    Period Weeks    Status Achieved      OT LONG TERM GOAL #2   Title Patient will transfer into shower with supervision    Baseline min assist    Time 8    Period Weeks    Status New      OT LONG TERM GOAL #3   Title Patient will demonstrate 10 lb increase in grip strength in LUE    Baseline 20    Time 8    Period Weeks    Status New      OT LONG TERM GOAL #4    Title Patient will demonstrate ability to manipulate small items within left hand with increased time, e.g. change    Time 8    Period Weeks    Status New      OT LONG TERM GOAL #5   Title Patient will demonstrate 120 degrees of active assisted shoulder flexion without report of increased pain    Time 8    Period Weeks    Status New      OT LONG TERM GOAL #6   Title xxx      OT LONG TERM GOAL #7   Title xxx      OT LONG TERM GOAL #8   Title xxx                 Plan - 06/21/20 1418    Clinical Impression Statement Patient in agreement with OT goals, and reports wanting to work toward improved functional use of LUE    OT Occupational Profile and History Detailed Assessment- Review of Records and additional review of physical, cognitive, psychosocial history related to current functional performance    Occupational performance deficits (Please refer to evaluation for details): ADL's;IADL's;Rest and Sleep;Leisure    Body Structure / Function / Physical Skills ADL;Coordination;Endurance;GMC;UE functional use;Sensation;Decreased knowledge of precautions;Balance;Body mechanics;Decreased knowledge of use of DME;Flexibility;IADL;Pain;Vision;Cardiopulmonary status limiting activity;Dexterity;FMC;Proprioception;Strength;Tone;ROM;Muscle spasms;Mobility    Cognitive Skills Attention;Energy/Drive;Learn;Sequencing;Safety Awareness;Problem Solve;Perception    Rehab Potential Good    Clinical Decision Making Limited treatment options, no task modification necessary    Comorbidities Affecting Occupational Performance: May have comorbidities impacting occupational performance    Modification or Assistance to Complete Evaluation  No modification of tasks or assist necessary to complete eval    OT Frequency 1x / week    OT Duration 8 weeks    OT Treatment/Interventions Self-care/ADL training;Electrical Stimulation;Therapeutic exercise;Visual/perceptual remediation/compensation;Patient/family  education;Splinting;Neuromuscular education;Moist Heat;Aquatic Therapy;Balance training;Therapeutic activities;Functional Mobility Training;Fluidtherapy;DME and/or AE instruction;Manual Therapy;Cognitive remediation/compensation;Passive range of motion;Contrast Bath;Ultrasound;Cryotherapy    Plan Grip strengthening -  redwas given prior, coordination HEP, address shoulder pain - Shoulder range and  mechanics    OT Home Exercise Plan coordination HEP, putty red - grip and pinch - from prior episode of care    Consulted and Agree with Plan of Care Patient;Family member/caregiver    Family Member Consulted Wife, [name]           Patient will benefit from skilled therapeutic intervention in order to improve the following deficits and impairments:   Body Structure / Function / Physical Skills: ADL,Coordination,Endurance,GMC,UE functional use,Sensation,Decreased knowledge of precautions,Balance,Body mechanics,Decreased knowledge of use of DME,Flexibility,IADL,Pain,Vision,Cardiopulmonary status limiting activity,Dexterity,FMC,Proprioception,Strength,Tone,ROM,Muscle spasms,Mobility Cognitive Skills: Attention,Energy/Drive,Learn,Sequencing,Safety Awareness,Problem Solve,Perception     Visit Diagnosis: Hemiplegia and hemiparesis following cerebral infarction affecting left dominant side (HCC)  Apraxia  Other disturbances of skin sensation  Other lack of coordination  Attention and concentration deficit  Muscle weakness (generalized)  Unsteadiness on feet    Problem List Patient Active Problem List   Diagnosis Date Noted  . H/O ETOH abuse 04/28/2020  . History of tobacco abuse 01/07/2020  . Hemiparesis affecting left side as late effect of stroke ([place]) 01/07/2020  . Abnormality of gait 09/24/2019  . AKI (acute kidney injury) ([place])   . Fever   . Sepsis without acute organ dysfunction ([place])   . Elevated BUN   . Blood pressure increase diastolic   . Labile blood pressure   . Leukopenia    . Benign essential HTN   . Hypoalbuminemia   . Cerebrovascular accident (CVA) of right basal ganglia ([place]) 07/07/2019  . Acute ischemic stroke ([place])   . Dyslipidemia   . Dysphagia, post-stroke   . Polycythemia   . Stroke ([place]) 06/30/2019  . Prediabetes 12/23/2016  . Elevated LFTs 12/23/2016  . Pure hypercholesterolemia 12/21/2016  . Alcoholism ([place]) 12/21/2016  . Eczema 09/18/2013  . Essential hypertension 09/18/2013  . Gastroesophageal reflux disease without esophagitis 09/18/2013  . Tobacco abuse 09/18/2013    Mariah Milling, OTR/L 06/21/2020, 2:21 PM  [facility] [address] [place], [name], [address] Phone: [phone]   Fax:  [phone]  Name: Tiago Humphrey MRN: [id number] Date of Birth: 01-21-1951

## 2020-06-22 NOTE — Therapy (Signed)
[facility] [address] [place], [name], [address] Phone: [phone]   Fax:  [phone]  Physical Therapy Treatment  Patient Details  Name: Russell Russell MRN: [id number] Date of Birth: Oct 01, 1950 Referring Provider (PT): Jamse Arn, MD   Encounter Date: 06/21/2020   PT End of Session - 06/21/20 1237    Visit Number 3    Number of Visits 9    Date for PT Re-Evaluation 08/06/20   POC for 8 weeks, Cert for 60 days   Authorization Type [employer] (medicare guidelines, 10th Visit PN)    Progress Note Due on Visit 10    PT Start Time 1233    PT Stop Time 1315    PT Time Calculation (min) 42 min    Equipment Utilized During Treatment Gait belt    Activity Tolerance Patient tolerated treatment well    Behavior During Therapy WFL for tasks assessed/performed           Past Medical History:  Diagnosis Date  . Alcohol abuse    drinks 1 gallon of Gin every weekend, nothing during the week x >15 yrs  . Elevated transaminase level    +elev bili: abd u/s 06/2014 showed stable small hepatic hemangiomas, o/w normal.  . Essential hypertension   . GERD (gastroesophageal reflux disease)   . Hyperlipidemia 03/2014   Atorv started-chol improved  . Impaired fasting glucose 03/2014  . Nephrolithiasis   . PUD (peptic ulcer disease)   . Stroke ([place])   . Tobacco dependence    chantix: "psych effects"    Past Surgical History:  Procedure Laterality Date  . COLONOSCOPY  2005   Recall 10 yrs ([place])  . LOOP RECORDER INSERTION N/A 07/03/2019   Procedure: LOOP RECORDER INSERTION;  Surgeon: Thompson Grayer, MD;  Location: [facility];  Service: Cardiovascular;  Laterality: N/A;  . removal of kidney stones  2006   cystoscopic--removed from ureter.  No prob since.    There were no vitals filed for this visit.   Subjective Assessment - 06/21/20 1237    Subjective No new changes/complaints. No falls. No pain  today.    Patient is accompained by: Family member   spouse   Pertinent History PMH: HTN, alcohol abuse (ongoing use), HLD, GERD, PUD, tobacco use    Limitations Standing;Walking;House hold activities    Patient Stated Goals improve strength of L Leg; Be able to walk without dragging LLE    Currently in Pain? No/denies    Pain Score 0-No pain                [facility] Adult PT Treatment/Exercise - 06/21/20 1238      Transfers   Transfers Sit to Stand;Stand to Sit    Sit to Stand 5: Supervision    Sit to Stand Details Verbal cues for sequencing;Verbal cues for precautions/safety;Verbal cues for safe use of DME/AE    Stand to Sit 5: Supervision    Stand to Sit Details (indicate cue type and reason) Verbal cues for sequencing;Verbal cues for technique;Verbal cues for safe use of DME/AE      Ambulation/Gait   Ambulation/Gait Yes    Ambulation/Gait Assistance 5: Supervision    Ambulation/Gait Assistance Details around gym with session    Assistive device Rolling walker    Gait Pattern Step-through pattern;Step-to pattern;Decreased stride length;Decreased stance time - right;Decreased step length - left    Ambulation Surface Level;Indoor      Knee/Hip Exercises: Aerobic  Other Aerobic Scifit level 2.5 with LE's only for 6 minutes with goal >/= 40 rpm for strengthening and activity tolerance.      Knee/Hip Exercises: Standing   Forward Step Up Both;1 set;10 reps;Hand Hold: 2;Step Height: 6";Limitations    Forward Step Up Limitations alternating forward step ups with contralateral march with non stance LE with cues on posture/ex form/technique. UE support on bars with min guard assist for safety.               Balance Exercises - 06/21/20 1309      Balance Exercises: Standing   Rockerboard Anterior/posterior;Lateral;EO;EC;30 seconds;Other reps (comment);Limitations;Intermittent UE support    Rockerboard Limitations performed on balance board in both directions with light to no  UE support: rocking the board with emphasis on tall posture with EO, progressing to EC with min guard assist for safety and mod reminder cues for tall posture; then holding the board steady for alternating UE raises, bil UE raises, then shoulder horizontal abduction for ~8-10 reps each with reminder cues to stay tall, min guard to min assist for balance. Then holding the board steady with UE's at sides for EC 30 sec's x 3 reps with min guard to min assist for balance, cues/faciliation for posture/weight shifting to assist with balance.    Sit to Stand Standard surface;Upper extremity support;Foam/compliant surface;Limitations    Sit to Stand Limitations feet on airex for sit<>stands x 5 reps with emphasis on tall posture and controlled descent with UE assist, min guard to min assist.               PT Short Term Goals - 06/07/20 1608      PT SHORT TERM GOAL #1   Title Patient will improve TUG to </= 34 seconds with RW to demo reduced fall risk (All STGs Due: 07/05/20)    Baseline 37.53    Time 4    Period Weeks    Status New    Target Date 07/05/20      PT SHORT TERM GOAL #2   Title Patient will improve Berg Balance to >/= 39/56 to demonstrate improved balance and reduced fall risk    Baseline 36/56    Time 4    Period Weeks    Status New      PT SHORT TERM GOAL #3   Title Patient will improve gait speed to >/= 1.25 ft/sec with LRAD to demo improved mobility    Baseline 1.14 ft/sec    Time 4    Period Weeks    Status New      PT SHORT TERM GOAL #4   Title Patient will be independent with initial HEP for strengthening/balance    Baseline HEP established at prior POC    Time 4    Period Weeks    Status New             PT Long Term Goals - 06/07/20 1611      PT LONG TERM GOAL #1   Title Patient will be independent with final strengthening/balance HEP and report completing walking program >/= 20 minutes daily(ALL LTG Due: 08/02/20)    Baseline HEP established at prior POC     Time 8    Period Weeks    Status New    Target Date 08/02/20      PT LONG TERM GOAL #2   Title patient will improve gait speed to >/= 1.4 ft/sec to demonstrate improved community mobility    Baseline 1.14  ft/sec    Time 8    Period Weeks    Status New      PT LONG TERM GOAL #3   Title Patient will demo ability to ambulate >100 ft on outdoor/unlevel surfaces with supervision to demo improved community mobility    Baseline TBA    Time 8    Period Weeks    Status New      PT LONG TERM GOAL #4   Title Patient will improve TUG to </= 28 seconds to demonstrate improved mobility and reduced fall risk    Baseline 37.53 secs    Time 8    Period Weeks    Status New      PT LONG TERM GOAL #5   Title Patient will improve Berg Balance to >/= 45/56 to demonstrate reduced risk for falls    Baseline 36/56    Time 8    Period Weeks    Status New                 Plan - 06/21/20 1238    Clinical Impression Statement Today's skilled session continued to focus on strengthening and balance training with emphasis on tall posture with ex's performed. No issues noted or reported by pt in session. The pt is progressing toward goals and should benefit from continued PT to progress toward unmet goals.    Personal Factors and Comorbidities Comorbidity 3+    Comorbidities HTN, alcohol abuse (ongoing use), HLD, GERD, PUD, tobacco use    Examination-Activity Limitations Bed Mobility;Dressing;Locomotion Level;Stairs;Stand;Toileting;Transfers;Bathing    Examination-Participation Restrictions Driving;Interpersonal Relationship;Yard Work;Community Activity    Stability/Clinical Decision Making Evolving/Moderate complexity    Rehab Potential Good    PT Frequency 1x / week    PT Duration 8 weeks    PT Treatment/Interventions ADLs/Self Care Home Management;Electrical Stimulation;DME Instruction;Moist Heat;Cryotherapy;Gait training;Stair training;Functional mobility training;Therapeutic  activities;Therapeutic exercise;Balance training;Neuromuscular re-education;Patient/family education;Orthotic Fit/Training;Wheelchair mobility training;Passive range of motion;Aquatic Therapy;Vestibular;Manual techniques;Dry needling;Joint Manipulations;Spinal Manipulations    PT Next Visit Plan Potential for Aquatic Therapy? Work on standing balance working toward narrow BOS. gait outdoors. standing weight shift activities    Consulted and Agree with Plan of Care Patient;Family member/caregiver    Family Member Consulted Wife           Patient will benefit from skilled therapeutic intervention in order to improve the following deficits and impairments:  Abnormal gait,Decreased coordination,Difficulty walking,Impaired tone,Decreased safety awareness,Decreased endurance,Decreased activity tolerance,Pain,Decreased balance,Decreased knowledge of use of DME,Postural dysfunction,Decreased strength,Decreased mobility  Visit Diagnosis: Unsteadiness on feet  Muscle weakness (generalized)  Other abnormalities of gait and mobility  Difficulty in walking, not elsewhere classified     Problem List Patient Active Problem List   Diagnosis Date Noted  . H/O ETOH abuse 04/28/2020  . History of tobacco abuse 01/07/2020  . Hemiparesis affecting left side as late effect of stroke ([place]) 01/07/2020  . Abnormality of gait 09/24/2019  . AKI (acute kidney injury) ([place])   . Fever   . Sepsis without acute organ dysfunction ([place])   . Elevated BUN   . Blood pressure increase diastolic   . Labile blood pressure   . Leukopenia   . Benign essential HTN   . Hypoalbuminemia   . Cerebrovascular accident (CVA) of right basal ganglia ([place]) 07/07/2019  . Acute ischemic stroke ([place])   . Dyslipidemia   . Dysphagia, post-stroke   . Polycythemia   . Stroke ([place]) 06/30/2019  . Prediabetes 12/23/2016  . Elevated LFTs 12/23/2016  . Pure hypercholesterolemia  12/21/2016  . Alcoholism ([place]) 12/21/2016  . Eczema  09/18/2013  . Essential hypertension 09/18/2013  . Gastroesophageal reflux disease without esophagitis 09/18/2013  . Tobacco abuse 09/18/2013    [name], PTA, [facility] [address] [place], [place] [address] [phone] 06/22/20, 11:02 AM   Name: Russell Russell MRN: [id number] Date of Birth: Feb 22, 1951

## 2020-06-28 ENCOUNTER — Encounter: Payer: Self-pay | Admitting: Adult Health

## 2020-06-28 ENCOUNTER — Ambulatory Visit: Payer: Medicare HMO | Admitting: Adult Health

## 2020-06-28 ENCOUNTER — Other Ambulatory Visit: Payer: Self-pay

## 2020-06-28 VITALS — BP 118/80 | HR 103 | Ht 70.0 in | Wt 186.0 lb

## 2020-06-28 DIAGNOSIS — I639 Cerebral infarction, unspecified: Secondary | ICD-10-CM | POA: Diagnosis not present

## 2020-06-28 DIAGNOSIS — G8114 Spastic hemiplegia affecting left nondominant side: Secondary | ICD-10-CM

## 2020-06-28 DIAGNOSIS — E785 Hyperlipidemia, unspecified: Secondary | ICD-10-CM | POA: Diagnosis not present

## 2020-06-28 DIAGNOSIS — R7309 Other abnormal glucose: Secondary | ICD-10-CM | POA: Diagnosis not present

## 2020-06-28 DIAGNOSIS — I480 Paroxysmal atrial fibrillation: Secondary | ICD-10-CM

## 2020-06-28 NOTE — Patient Instructions (Signed)
Continue Xarelto (rivaroxaban) daily  and atorvastatin  for secondary stroke prevention  Follow up with your PCP to check cholesterol and A1c levels   Continue to follow with cardiology for atrial fibrillation and Xarelto management   Continue to follow up with PCP regarding cholesterol and blood pressure management  Maintain strict control of hypertension with blood pressure goal below 130/90 and cholesterol with LDL cholesterol (bad cholesterol) goal below 70 mg/dL.       Followup in the future with me in 6 months or call earlier if needed       Thank you for coming to see [place] at [facility]. I hope we have been able to provide you high quality care today.  You may receive a patient satisfaction survey over the next few weeks. We would appreciate your feedback and comments so that we may continue to improve ourselves and the health of our patients.

## 2020-06-28 NOTE — Progress Notes (Signed)
[facility] [address] [place]. [place] [address] [phone]       OFFICE FOLLOW UP NOTE  [name] Date of Birth:  Jun 06, 1950 Medical Record Number:  [id number]   Referring MD: Rosalin Hawking Reason for Referral: Stroke   Chief Complaint  Patient presents with  . Follow-up    RM 14  with wife (wanda) Pt is well, L sided weakness overall doing fine.       HPI:   Today, 06/28/2020, [name] returns for 6 month follow up accompanied by his wife, [name] from stroke standpoint without new stroke/TIA symptoms Residual deficits of left hemiparesis and gait impairment with imbalance but reports ongoing improvement currently working with PT/OT.  Continue use of rolling walker at all times and denies any recent falls  Loop recorder showed evidence of A. fib in 01/2020 - started on Xarelto as Eliquis not covered by patient's insurance.  He has remained on Xarelto without bleeding or bruising concerns Reports compliance on atorvastatin -denies associated side effects Blood pressure today 118/80 -monitors at home which has been stable Unfortunately, he has returned back to tobacco and EtOH use currently smoking 1 pack every 3 days and drinking approximately 3-4 beers per day  No further concerns at this time    History provided for reference purposes only Update 12/29/2019 JM: [name] returns for 3 months stroke follow-up accompanied by his wife.  He has been doing well since prior visit with residual left-sided weakness, gait impairment and imbalance currently working with PT with continued progress.  Currently ambulating short distances with rolling walker and denies any recent falls.  Denies new or worsening stroke/TIA symptoms.  Continues on aspirin and atorvastatin for secondary stroke prevention without side effects.  Blood pressure today 134/94.  Monitors at home and typically 120-130s/80s.  Loop recorder has not shown atrial fibrillation  thus far.  Reports continued tobacco and EtOH cessation.  No concerns at this time.  Consult visit 09/22/2019 Dr. Leonie Man: [name] 70 year old male seen today for initial office consultation visit for stroke in April 2021. History is obtained from the patient and review of electronic medical records and I personally reviewed available imaging films in PACS.  He is a 70 year old male with past medical history of hypertension, hyperlipidemia, tobacco abuse and alcohol abuse who presented to [facility]/30[facility]2019 with left-sided weakness did not call for help right away.  And his wife notices in the evening she brought him to the hospital.  His NIH stroke scale was 9.  CT scan of the head showed no acute abnormality.  CT angiogram of the head and neck showed no large vessel occlusion or stenosis.  There is subtle right internal carotid artery bulb filling defect raising possibility of carotid web versus thrombus.  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  MRI scan showed multiple scattered posterior right basal ganglia, right cerebral, right cerebellar infarcts with multiple old lacune's in both basal ganglia, thalami, pons and left cerebellum.  Carotid ultrasound showed a focal area of the right ICA bulb possibly a thrombus versus web.  2D echo showed normal ejection fraction.  Lower extremity venous Dopplers was negative for DVT.  LDL cholesterol was 109 mg percent.  Hemoglobin A1c was 6.2.  Patient had loop recorder inserted to look for paroxysmal A. fib.  Patient was discharged on 3 months of aspirin and Plavix followed by aspirin alone.  Patient states he has done well since  discharge.  He is quit smoking as well as drinking alcohol.  He is currently undergoing outpatient physical and occupational therapy.  He is walking with a walker but needs a 1 person assist.  He also needs some help with activities of daily living like cutting his food but he can eat himself.  Is tolerating aspirin and  Plavix well with only minor bruising.  He has had no recurrent stroke or TIA symptoms.  ROS:   14 system review of systems is positive for those listed in HPI and all other symptoms negative  PMH:  Past Medical History:  Diagnosis Date  . Alcohol abuse    drinks 1 gallon of Gin every weekend, nothing during the week x >15 yrs  . Elevated transaminase level    +elev bili: abd u/s 06/2014 showed stable small hepatic hemangiomas, o/w normal.  . Essential hypertension   . GERD (gastroesophageal reflux disease)   . Hyperlipidemia 03/2014   Atorv started-chol improved  . Impaired fasting glucose 03/2014  . Nephrolithiasis   . PUD (peptic ulcer disease)   . Stroke ([place])   . Tobacco dependence    chantix: "psych effects"    Social History:  Social History   Socioeconomic History  . Marital status: Married    Spouse name: Not on file  . Number of children: Not on file  . Years of education: Not on file  . Highest education level: Not on file  Occupational History  . Not on file  Tobacco Use  . Smoking status: Former Smoker    Packs/day: 0.50    Years: 20.00    Pack years: 10.00    Types: Cigarettes  . Smokeless tobacco: Never Used  Vaping Use  . Vaping Use: Never used  Substance and Sexual Activity  . Alcohol use: Yes    Alcohol/week: 8.0 standard drinks    Types: 8 Cans of beer per week    Comment: Weekends only  . Drug use: No  . Sexual activity: Not on file  Other Topics Concern  . Not on file  Social History Narrative   Married, no children.   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   Tob: 10 pack-yr hx (current as of 03/2014).   Alcohol: 8 beers and pint of whisky on weekends only.   Denies hx of prob with drugs or alcohol.   Social Determinants of Health   Financial Resource Strain: Not on file  Food Insecurity: Not on file  Transportation Needs: Not on file  Physical Activity: Not on file  Stress: Not on file   Social Connections: Not on file  Intimate Partner Violence: Not on file    Medications:   Current Outpatient Medications on File Prior to Visit  Medication Sig Dispense Refill  . acetaminophen (TYLENOL) 325 MG tablet Take 2 tablets (650 mg total) by mouth every 4 (four) hours as needed for mild pain (or temp > 37.5 C (99.5 F)).    Marland Kitchen atorvastatin (LIPITOR) 40 MG tablet Take 1 tablet (40 mg total) by mouth daily. 90 tablet 1  . folic acid (FOLVITE) 1 MG tablet TAKE 1 TABLET BY MOUTH EVERY DAY 90 tablet 1  . halobetasol (ULTRAVATE) 0.05 % cream Apply 1 application topically as needed (skin irritation).    . metoprolol tartrate (LOPRESSOR) 25 MG tablet Take 1 tablet (25 mg total) by mouth daily. 90 tablet 3  . Multiple Vitamin (MULTIVITAMIN WITH MINERALS) TABS tablet Take  1 tablet by mouth daily.    Marland Kitchen omeprazole (PRILOSEC) 20 MG capsule Take 20 mg by mouth as needed (acid reflux).    . rivaroxaban (XARELTO) 20 MG TABS tablet Take 1 tablet (20 mg total) by mouth daily with supper. 90 tablet 1   No current facility-administered medications on file prior to visit.    Allergies:   Allergies  Allergen Reactions  . Penicillins Rash    Physical Exam Today's Vitals   06/28/20 0832  BP: 118/80  Pulse: (!) 103  Weight: 186 lb (84.4 kg)  Height: 5\' 10"  (1.778 m)   Body mass index is 26.69 kg/m.   General: well developed, well nourished pleasant middle-aged African-American male, seated, in no evident distress Head: head normocephalic and atraumatic.   Neck: supple with no carotid or supraclavicular bruits Cardiovascular: regular rate and rhythm, no murmurs Musculoskeletal: no deformity Skin:  no rash/petichiae Vascular:  Normal pulses all extremities  Neurologic Exam Mental Status: Awake and fully alert.  Mild dysarthria but able to be understood without difficulty.  Follows commands without difficulty.  Oriented to place and date. Recent and remote memory intact.  Attention span,  concentration and fund of knowledge intact during visit.  Mood and affect appropriate.  Cranial Nerves: Pupils equal, briskly reactive to light. Extraocular movements full without nystagmus. Visual fields full to confrontation. Hearing intact. Facial sensation intact. Left lower face weakness., tongue, palate moves normally and symmetrically.  Motor: Full strength right upper and lower extremity  LUE: 4+/5 with increased tone and ataxia  LLE: 4+-5/5 with slightly increased tone and ataxia Sensory.: intact to touch , pinprick , position and vibratory sensation.  Coordination: Rapid alternating movements normal in all extremities except decreased left hand. Finger-to-nose and heel-to-shin performed accurately on right side and mild ataxia on left side. Gait and Station: Stands from seated position without difficulty.  Gait demonstrates decreased LLE step height and stride length with mild imbalance and use of rolling walker.  Tandem walk and heel toe not attempted. Reflexes: Brisk LUE and LLE. Toes downgoing.      ASSESSMENT/PLAN: 70 year old male with recent right basal ganglia, and right cerebellum infarcts likely of cryptogenic etiology in 06/2019 s/p loop recorder placement. 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    R BG and cerebellum stroke, cryptogenic -Residual left spastic hemiparesis, dysarthria and gait impairment with imbalance.  Encourage continued participation with PT/OT as he reports continued progress -Loop recorder showed evidence of atrial fibrillation 01/2020 -Xarelto initiated as Eliquis not covered by insurance -Continue Xarelto and atorvastatin 40 mg daily for secondary stroke prevention -Discussed secondary stroke prevention measures and importance of close PCP follow-up and maintain strict control of hypertension with blood pressure goal below 130/90 and lipids with LDL cholesterol goal  below 70 mg/dL. No recent lab work -currently not fasting. 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 Ulice Bold, [place], [place]    I spent 30 minutes of face-to-face and non-face-to-face time with patient and wife.  This included previsit chart review, lab review, study review, order entry, electronic health record documentation, patient and wife discussion and education regarding prior stroke, residual deficits, new finding of A. fib and use of AC, importance of managing stroke risk factors, current use of EtOH and tobacco use and answered all other questions to patient and wife satisfaction  Frann Rider, AGNP-BC  [facility] [address] [place], [place] [address]  Phone [phone] Fax [phone] Note: This document was prepared with digital dictation and possible smart phrase technology. Any transcriptional errors that result from this process are unintentional.

## 2020-06-29 ENCOUNTER — Other Ambulatory Visit (INDEPENDENT_AMBULATORY_CARE_PROVIDER_SITE_OTHER): Payer: Self-pay

## 2020-06-29 ENCOUNTER — Other Ambulatory Visit: Payer: Self-pay | Admitting: *Deleted

## 2020-06-29 DIAGNOSIS — I639 Cerebral infarction, unspecified: Secondary | ICD-10-CM | POA: Diagnosis not present

## 2020-06-29 DIAGNOSIS — Z0289 Encounter for other administrative examinations: Secondary | ICD-10-CM

## 2020-06-30 ENCOUNTER — Ambulatory Visit: Payer: Medicare HMO | Admitting: Occupational Therapy

## 2020-06-30 ENCOUNTER — Encounter: Payer: Self-pay | Admitting: Occupational Therapy

## 2020-06-30 ENCOUNTER — Other Ambulatory Visit: Payer: Self-pay

## 2020-06-30 ENCOUNTER — Telehealth: Payer: Self-pay

## 2020-06-30 DIAGNOSIS — M6281 Muscle weakness (generalized): Secondary | ICD-10-CM | POA: Diagnosis not present

## 2020-06-30 DIAGNOSIS — R2681 Unsteadiness on feet: Secondary | ICD-10-CM | POA: Diagnosis not present

## 2020-06-30 DIAGNOSIS — R41840 Attention and concentration deficit: Secondary | ICD-10-CM | POA: Diagnosis not present

## 2020-06-30 DIAGNOSIS — I69352 Hemiplegia and hemiparesis following cerebral infarction affecting left dominant side: Secondary | ICD-10-CM | POA: Diagnosis not present

## 2020-06-30 DIAGNOSIS — R278 Other lack of coordination: Secondary | ICD-10-CM

## 2020-06-30 DIAGNOSIS — R208 Other disturbances of skin sensation: Secondary | ICD-10-CM

## 2020-06-30 DIAGNOSIS — R482 Apraxia: Secondary | ICD-10-CM | POA: Diagnosis not present

## 2020-06-30 DIAGNOSIS — R2689 Other abnormalities of gait and mobility: Secondary | ICD-10-CM | POA: Diagnosis not present

## 2020-06-30 DIAGNOSIS — R262 Difficulty in walking, not elsewhere classified: Secondary | ICD-10-CM | POA: Diagnosis not present

## 2020-06-30 LAB — LIPID PANEL
Chol/HDL Ratio: 3.2 ratio (ref 0.0–5.0)
Cholesterol, Total: 137 mg/dL (ref 100–199)
HDL: 43 mg/dL (ref >39)
LDL Chol Calc (NIH): 79 mg/dL (ref 0–99)
Triglycerides: 78 mg/dL (ref 0–149)
VLDL Cholesterol Cal: 15 mg/dL (ref 5–40)

## 2020-06-30 LAB — HEMOGLOBIN A1C
Est. average glucose Bld gHb Est-mCnc: 131 mg/dL
Hgb A1c MFr Bld: 6.2 % — ABNORMAL HIGH (ref 4.8–5.6)

## 2020-06-30 NOTE — Therapy (Signed)
[facility] [address] [place], [name], [address] Phone: [phone]   Fax:  [phone]  Occupational Therapy Treatment  Patient Details  Name: Russell Russell MRN: [id number] Date of Birth: [birth year] Referring Provider (OT): Delice Lesch   Encounter Date: 06/30/2020   OT End of Session - 06/30/20 6283    Visit Number 3    Number of Visits 9    Date for OT Re-Evaluation 08/21/20    Authorization Type [employer] - need auth    OT Start Time 1320    OT Stop Time 1400    OT Time Calculation (min) 40 min    Activity Tolerance Patient tolerated treatment well    Behavior During Therapy [facility] for tasks assessed/performed           Past Medical History:  Diagnosis Date  . Alcohol abuse    drinks 1 gallon of Gin every weekend, nothing during the week x >15 yrs  . Elevated transaminase level    +elev bili: abd u/s 06/2014 showed stable small hepatic hemangiomas, o/w normal.  . Essential hypertension   . GERD (gastroesophageal reflux disease)   . Hyperlipidemia 03/2014   Atorv started-chol improved  . Impaired fasting glucose 03/2014  . Nephrolithiasis   . PUD (peptic ulcer disease)   . Stroke ([place])   . Tobacco dependence    chantix: "psych effects"    Past Surgical History:  Procedure Laterality Date  . COLONOSCOPY  2005   Recall 10 yrs ([place])  . LOOP RECORDER INSERTION N/A 07/03/2019   Procedure: LOOP RECORDER INSERTION;  Surgeon: Thompson Grayer, MD;  Location: [facility];  Service: Cardiovascular;  Laterality: N/A;  . removal of kidney stones  2006   cystoscopic--removed from ureter.  No prob since.    There were no vitals filed for this visit.   Subjective Assessment - 06/30/20 1325    Subjective  Not much I can do - just sitting round.  Waiting to get into new house.    Patient is accompanied by: Family member    Pertinent History HTN, ETOH use, smoker, loop recorder    Currently in  Pain? No/denies    Pain Score 0-No pain              [facility] OT Assessment - 06/30/20 0001      Hand Function   Left Hand Gross Grasp Impaired    Left Hand Grip (lbs) 30   Initially 20                   OT Treatments/Exercises (OP) - 06/30/20 0001      Exercises   Exercises Hand      Hand Exercises   Other Hand Exercises Digiflex 1.5 lbs composite grip and hold x 5 sec and release    Other Hand Exercises Hand gripper on lightest setting      Theraputty   Theraputty Hand- Locate Pegs red putty locate 16 pegs                  OT Education - 06/30/20 1346    Education Details Progress with grip strength since initial visit, functional sue of left hand    Person(s) Educated Spouse    Methods Explanation    Comprehension Verbalized understanding            OT Short Term Goals - 06/30/20 1358      OT SHORT TERM GOAL #1  Title Patient will compelte a home exercise program designed to improve functional use of LUE due 5/13    Time 4    Period Weeks    Status On-going    Target Date 07/22/20      OT SHORT TERM GOAL #2   Title Patient will report decrease in pain with passive range of motion beyond 80 degrees of shoulder flexion    Time 4    Period Weeks    Status On-going      OT SHORT TERM GOAL #3   Title Patient will demonstrate 3 lb increase in grip strength in LUE    Baseline 20    Time 4    Period Weeks    Status Achieved      OT SHORT TERM GOAL #4   Title XXX      OT SHORT TERM GOAL #5   Title XXX      OT SHORT TERM GOAL #6   Title XXX             OT Long Term Goals - 06/30/20 1359      OT LONG TERM GOAL #1   Title Patient will complete an updated HEP to address LUE functioning DUE 08/21/20    Time 8    Period Weeks    Status Achieved      OT LONG TERM GOAL #2   Title Patient will transfer into shower with supervision    Baseline min assist    Time 8    Period Weeks    Status New      OT LONG TERM GOAL #3   Title  Patient will demonstrate 10 lb increase in grip strength in LUE    Baseline 20    Time 8    Period Weeks    Status New      OT LONG TERM GOAL #4   Title Patient will demonstrate ability to manipulate small items within left hand with increased time, e.g. change    Time 8    Period Weeks    Status New      OT LONG TERM GOAL #5   Title Patient will demonstrate 120 degrees of active assisted shoulder flexion without report of increased pain    Time 8    Period Weeks    Status New      OT LONG TERM GOAL #6   Title xxx      OT LONG TERM GOAL #7   Title xxx      OT LONG TERM GOAL #8   Title xxx                 Plan - 06/30/20 1347    Clinical Impression Statement Patient still waiting to move into his house, so has limited space for exercise - staying in extended stay    OT Occupational Profile and History Detailed Assessment- Review of Records and additional review of physical, cognitive, psychosocial history related to current functional performance    Occupational performance deficits (Please refer to evaluation for details): ADL's;IADL's;Rest and Sleep;Leisure    Body Structure / Function / Physical Skills ADL;Coordination;Endurance;GMC;UE functional use;Sensation;Decreased knowledge of precautions;Balance;Body mechanics;Decreased knowledge of use of DME;Flexibility;IADL;Pain;Vision;Cardiopulmonary status limiting activity;Dexterity;FMC;Proprioception;Strength;Tone;ROM;Muscle spasms;Mobility    Cognitive Skills Attention;Energy/Drive;Learn;Sequencing;Safety Awareness;Problem Solve;Perception    Psychosocial Skills Habits;Routines and Behaviors    Rehab Potential Good    Clinical Decision Making Limited treatment options, no task modification necessary    Comorbidities Affecting Occupational Performance: May have comorbidities  impacting occupational performance    Modification or Assistance to Complete Evaluation  No modification of tasks or assist necessary to complete  eval    OT Frequency 1x / week    OT Duration 8 weeks    OT Treatment/Interventions Self-care/ADL training;Electrical Stimulation;Therapeutic exercise;Visual/perceptual remediation/compensation;Patient/family education;Splinting;Neuromuscular education;Moist Heat;Aquatic Therapy;Balance training;Therapeutic activities;Functional Mobility Training;Fluidtherapy;DME and/or AE instruction;Manual Therapy;Cognitive remediation/compensation;Passive range of motion;Contrast Bath;Ultrasound;Cryotherapy    Plan coordination left hand, interlimb - hand,wrist, forearm, elbow motion    OT Home Exercise Plan coordination HEP, putty red - grip and pinch - from prior episode of care    Consulted and Agree with Plan of Care Patient;Family member/caregiver    Family Member Consulted Wife, [name]           Patient will benefit from skilled therapeutic intervention in order to improve the following deficits and impairments:   Body Structure / Function / Physical Skills: ADL,Coordination,Endurance,GMC,UE functional use,Sensation,Decreased knowledge of precautions,Balance,Body mechanics,Decreased knowledge of use of DME,Flexibility,IADL,Pain,Vision,Cardiopulmonary status limiting activity,Dexterity,FMC,Proprioception,Strength,Tone,ROM,Muscle spasms,Mobility Cognitive Skills: Attention,Energy/Drive,Learn,Sequencing,Safety Awareness,Problem Solve,Perception Psychosocial Skills: Habits,Routines and Behaviors   Visit Diagnosis: Hemiplegia and hemiparesis following cerebral infarction affecting left dominant side (HCC)  Apraxia  Other disturbances of skin sensation  Other lack of coordination  Attention and concentration deficit  Muscle weakness (generalized)  Unsteadiness on feet    Problem List Patient Active Problem List   Diagnosis Date Noted  . H/O ETOH abuse 04/28/2020  . History of tobacco abuse 01/07/2020  . Hemiparesis affecting left side as late effect of stroke ([place]) 01/07/2020  .  Abnormality of gait 09/24/2019  . AKI (acute kidney injury) ([place])   . Fever   . Sepsis without acute organ dysfunction ([place])   . Elevated BUN   . Blood pressure increase diastolic   . Labile blood pressure   . Leukopenia   . Benign essential HTN   . Hypoalbuminemia   . Cerebrovascular accident (CVA) of right basal ganglia ([place]) 07/07/2019  . Acute ischemic stroke ([place])   . Dyslipidemia   . Dysphagia, post-stroke   . Polycythemia   . Stroke ([place]) 06/30/2019  . Prediabetes 12/23/2016  . Elevated LFTs 12/23/2016  . Pure hypercholesterolemia 12/21/2016  . Alcoholism ([place]) 12/21/2016  . Eczema 09/18/2013  . Essential hypertension 09/18/2013  . Gastroesophageal reflux disease without esophagitis 09/18/2013  . Tobacco abuse 09/18/2013    Mariah Milling, OTR/L 06/30/2020, 1:59 PM  [facility] [address] [place], [name], [address] Phone: [phone]   Fax:  [phone]  Name: Russell Russell MRN: [id number] Date of Birth: 10-Mar-1951

## 2020-06-30 NOTE — Telephone Encounter (Signed)
Contacted pt to inform him that recent A1c consistent with A1c 1 year prior. Lipid panel showed LDL 79 - advised to continue current treatment regimen and to contact the office with further questions. He understood.

## 2020-07-04 DIAGNOSIS — R0989 Other specified symptoms and signs involving the circulatory and respiratory systems: Secondary | ICD-10-CM | POA: Diagnosis not present

## 2020-07-04 DIAGNOSIS — R509 Fever, unspecified: Secondary | ICD-10-CM | POA: Diagnosis not present

## 2020-07-04 DIAGNOSIS — Z720 Tobacco use: Secondary | ICD-10-CM | POA: Diagnosis not present

## 2020-07-04 DIAGNOSIS — F1010 Alcohol abuse, uncomplicated: Secondary | ICD-10-CM | POA: Diagnosis not present

## 2020-07-04 DIAGNOSIS — D72819 Decreased white blood cell count, unspecified: Secondary | ICD-10-CM | POA: Diagnosis not present

## 2020-07-04 DIAGNOSIS — E8809 Other disorders of plasma-protein metabolism, not elsewhere classified: Secondary | ICD-10-CM | POA: Diagnosis not present

## 2020-07-04 DIAGNOSIS — R030 Elevated blood-pressure reading, without diagnosis of hypertension: Secondary | ICD-10-CM | POA: Diagnosis not present

## 2020-07-04 DIAGNOSIS — I639 Cerebral infarction, unspecified: Secondary | ICD-10-CM | POA: Diagnosis not present

## 2020-07-04 DIAGNOSIS — A419 Sepsis, unspecified organism: Secondary | ICD-10-CM | POA: Diagnosis not present

## 2020-07-04 NOTE — Progress Notes (Signed)
I agree with the above plan 

## 2020-07-06 ENCOUNTER — Encounter: Payer: Self-pay | Admitting: Occupational Therapy

## 2020-07-06 ENCOUNTER — Ambulatory Visit: Payer: Medicare HMO | Admitting: Occupational Therapy

## 2020-07-06 ENCOUNTER — Other Ambulatory Visit: Payer: Self-pay

## 2020-07-06 ENCOUNTER — Ambulatory Visit: Payer: Medicare HMO | Admitting: Physical Therapy

## 2020-07-06 ENCOUNTER — Encounter: Payer: Self-pay | Admitting: Physical Therapy

## 2020-07-06 DIAGNOSIS — R2689 Other abnormalities of gait and mobility: Secondary | ICD-10-CM

## 2020-07-06 DIAGNOSIS — I69352 Hemiplegia and hemiparesis following cerebral infarction affecting left dominant side: Secondary | ICD-10-CM | POA: Diagnosis not present

## 2020-07-06 DIAGNOSIS — R208 Other disturbances of skin sensation: Secondary | ICD-10-CM | POA: Diagnosis not present

## 2020-07-06 DIAGNOSIS — M6281 Muscle weakness (generalized): Secondary | ICD-10-CM | POA: Diagnosis not present

## 2020-07-06 DIAGNOSIS — R262 Difficulty in walking, not elsewhere classified: Secondary | ICD-10-CM | POA: Diagnosis not present

## 2020-07-06 DIAGNOSIS — R2681 Unsteadiness on feet: Secondary | ICD-10-CM

## 2020-07-06 DIAGNOSIS — R482 Apraxia: Secondary | ICD-10-CM | POA: Diagnosis not present

## 2020-07-06 DIAGNOSIS — R278 Other lack of coordination: Secondary | ICD-10-CM | POA: Diagnosis not present

## 2020-07-06 DIAGNOSIS — R41840 Attention and concentration deficit: Secondary | ICD-10-CM | POA: Diagnosis not present

## 2020-07-06 NOTE — Therapy (Signed)
[facility] [address] [place], [name], [address] Phone: [phone]   Fax:  [phone]  Occupational Therapy Treatment  Patient Details  Name: Russell Russell MRN: [id number] Date of Birth: [birth year] Referring Provider (OT): Delice Lesch   Encounter Date: 07/06/2020   OT End of Session - 07/06/20 1421    Visit Number 4    Number of Visits 9    Date for OT Re-Evaluation 08/21/20    Authorization Type [employer] - need auth    OT Start Time 1400    OT Stop Time 1445    OT Time Calculation (min) 45 min    Activity Tolerance Patient tolerated treatment well    Behavior During Therapy [facility] for tasks assessed/performed           Past Medical History:  Diagnosis Date  . Alcohol abuse    drinks 1 gallon of Gin every weekend, nothing during the week x >15 yrs  . Elevated transaminase level    +elev bili: abd u/s 06/2014 showed stable small hepatic hemangiomas, o/w normal.  . Essential hypertension   . GERD (gastroesophageal reflux disease)   . Hyperlipidemia 03/2014   Atorv started-chol improved  . Impaired fasting glucose 03/2014  . Nephrolithiasis   . PUD (peptic ulcer disease)   . Stroke ([place])   . Tobacco dependence    chantix: "psych effects"    Past Surgical History:  Procedure Laterality Date  . COLONOSCOPY  2005   Recall 10 yrs ([place])  . LOOP RECORDER INSERTION N/A 07/03/2019   Procedure: LOOP RECORDER INSERTION;  Surgeon: Thompson Grayer, MD;  Location: [facility];  Service: Cardiovascular;  Laterality: N/A;  . removal of kidney stones  2006   cystoscopic--removed from ureter.  No prob since.    There were no vitals filed for this visit.   Subjective Assessment - 07/06/20 1422    Subjective  Pt reports using LUE sometimes at home but can't use it much cause they aren't in their house    Patient is accompanied by: Family member    Pertinent History HTN, ETOH use, smoker, loop  recorder    Currently in Pain? No/denies            Medium Pegs with LUE with following pattern. Pt used RUE for stabilizing board but used LUE for placing pegs and rotating pegs for orientation. Mod cues for correcting errors with following pattern.  Hand Gripper: with LUE on level 2 with black spring. Pt picked up 1 inch blocks with gripper with max difficulty. Downgraded to level 1 with black spring - moderate difficulty and multiple breaks d/t fatigue.                     OT Short Term Goals - 06/30/20 1358      OT SHORT TERM GOAL #1   Title Patient will compelte a home exercise program designed to improve functional use of LUE due 5/13    Time 4    Period Weeks    Status On-going    Target Date 07/22/20      OT SHORT TERM GOAL #2   Title Patient will report decrease in pain with passive range of motion beyond 80 degrees of shoulder flexion    Time 4    Period Weeks    Status On-going      OT SHORT TERM GOAL #3   Title Patient will demonstrate 3 lb increase in  grip strength in LUE    Baseline 20    Time 4    Period Weeks    Status Achieved      OT SHORT TERM GOAL #4   Title XXX      OT SHORT TERM GOAL #5   Title XXX      OT SHORT TERM GOAL #6   Title XXX             OT Long Term Goals - 06/30/20 1359      OT LONG TERM GOAL #1   Title Patient will complete an updated HEP to address LUE functioning DUE 08/21/20    Time 8    Period Weeks    Status Achieved      OT LONG TERM GOAL #2   Title Patient will transfer into shower with supervision    Baseline min assist    Time 8    Period Weeks    Status New      OT LONG TERM GOAL #3   Title Patient will demonstrate 10 lb increase in grip strength in LUE    Baseline 20    Time 8    Period Weeks    Status New      OT LONG TERM GOAL #4   Title Patient will demonstrate ability to manipulate small items within left hand with increased time, e.g. change    Time 8    Period Weeks     Status New      OT LONG TERM GOAL #5   Title Patient will demonstrate 120 degrees of active assisted shoulder flexion without report of increased pain    Time 8    Period Weeks    Status New      OT LONG TERM GOAL #6   Title xxx      OT LONG TERM GOAL #7   Title xxx      OT LONG TERM GOAL #8   Title xxx                 Plan - 07/06/20 1430    Clinical Impression Statement Pt improving with overall coordination with LUE - encouraged to use LUE more functionally at home although limited by space in extended stay hotel at this time.    OT Occupational Profile and History Detailed Assessment- Review of Records and additional review of physical, cognitive, psychosocial history related to current functional performance    Occupational performance deficits (Please refer to evaluation for details): ADL's;IADL's;Rest and Sleep;Leisure    Body Structure / Function / Physical Skills ADL;Coordination;Endurance;GMC;UE functional use;Sensation;Decreased knowledge of precautions;Balance;Body mechanics;Decreased knowledge of use of DME;Flexibility;IADL;Pain;Vision;Cardiopulmonary status limiting activity;Dexterity;FMC;Proprioception;Strength;Tone;ROM;Muscle spasms;Mobility    Cognitive Skills Attention;Energy/Drive;Learn;Sequencing;Safety Awareness;Problem Solve;Perception    Psychosocial Skills Habits;Routines and Behaviors    Rehab Potential Good    Clinical Decision Making Limited treatment options, no task modification necessary    Comorbidities Affecting Occupational Performance: May have comorbidities impacting occupational performance    Modification or Assistance to Complete Evaluation  No modification of tasks or assist necessary to complete eval    OT Frequency 1x / week    OT Duration 8 weeks    OT Treatment/Interventions Self-care/ADL training;Electrical Stimulation;Therapeutic exercise;Visual/perceptual remediation/compensation;Patient/family education;Splinting;Neuromuscular  education;Moist Heat;Aquatic Therapy;Balance training;Therapeutic activities;Functional Mobility Training;Fluidtherapy;DME and/or AE instruction;Manual Therapy;Cognitive remediation/compensation;Passive range of motion;Contrast Bath;Ultrasound;Cryotherapy    Plan coordination left hand, interlimb - hand,wrist, forearm, elbow motion    OT Home Exercise Plan coordination HEP, putty red - grip and pinch -  from prior episode of care    Consulted and Agree with Plan of Care Patient;Family member/caregiver    Family Member Consulted Wife, [name]           Patient will benefit from skilled therapeutic intervention in order to improve the following deficits and impairments:   Body Structure / Function / Physical Skills: ADL,Coordination,Endurance,GMC,UE functional use,Sensation,Decreased knowledge of precautions,Balance,Body mechanics,Decreased knowledge of use of DME,Flexibility,IADL,Pain,Vision,Cardiopulmonary status limiting activity,Dexterity,FMC,Proprioception,Strength,Tone,ROM,Muscle spasms,Mobility Cognitive Skills: Attention,Energy/Drive,Learn,Sequencing,Safety Awareness,Problem Solve,Perception Psychosocial Skills: Habits,Routines and Behaviors   Visit Diagnosis: Hemiplegia and hemiparesis following cerebral infarction affecting left dominant side ([place])  Other lack of coordination  Attention and concentration deficit  Muscle weakness (generalized)  Unsteadiness on feet    Problem List Patient Active Problem List   Diagnosis Date Noted  . H/O ETOH abuse 04/28/2020  . History of tobacco abuse 01/07/2020  . Hemiparesis affecting left side as late effect of stroke ([place]) 01/07/2020  . Abnormality of gait 09/24/2019  . AKI (acute kidney injury) ([place])   . Fever   . Sepsis without acute organ dysfunction ([place])   . Elevated BUN   . Blood pressure increase diastolic   . Labile blood pressure   . Leukopenia   . Benign essential HTN   . Hypoalbuminemia   . Cerebrovascular accident  (CVA) of right basal ganglia ([place]) 07/07/2019  . Acute ischemic stroke ([place])   . Dyslipidemia   . Dysphagia, post-stroke   . Polycythemia   . Stroke ([place]) 06/30/2019  . Prediabetes 12/23/2016  . Elevated LFTs 12/23/2016  . Pure hypercholesterolemia 12/21/2016  . Alcoholism ([place]) 12/21/2016  . Eczema 09/18/2013  . Essential hypertension 09/18/2013  . Gastroesophageal reflux disease without esophagitis 09/18/2013  . Tobacco abuse 09/18/2013    Zachery Conch MOT, OTR/L  07/06/2020, 3:07 PM  [facility] [address] [place], [name], [address] Phone: [phone]   Fax:  [phone]  Name: Russell Russell MRN: [id number] Date of Birth: 06-01-1950

## 2020-07-07 NOTE — Therapy (Signed)
[facility] [address] [place], [name], [address] Phone: [phone]   Fax:  [phone]  Physical Therapy Treatment  Patient Details  Name: Russell Russell MRN: [id number] Date of Birth: 07/20/1950 Referring Provider (PT): Jamse Arn, MD   Encounter Date: 07/06/2020   PT End of Session - 07/06/20 1323    Visit Number 4    Number of Visits 9    Date for PT Re-Evaluation 08/06/20   POC for 8 weeks, Cert for 60 days   Authorization Type [employer] (medicare guidelines, 10th Visit PN)    Progress Note Due on Visit 10    PT Start Time 1318    PT Stop Time 1400    PT Time Calculation (min) 42 min    Equipment Utilized During Treatment Gait belt    Activity Tolerance Patient tolerated treatment well    Behavior During Therapy [facility] for tasks assessed/performed           Past Medical History:  Diagnosis Date  . Alcohol abuse    drinks 1 gallon of Gin every weekend, nothing during the week x >15 yrs  . Elevated transaminase level    +elev bili: abd u/s 06/2014 showed stable small hepatic hemangiomas, o/w normal.  . Essential hypertension   . GERD (gastroesophageal reflux disease)   . Hyperlipidemia 03/2014   Atorv started-chol improved  . Impaired fasting glucose 03/2014  . Nephrolithiasis   . PUD (peptic ulcer disease)   . Stroke ([place])   . Tobacco dependence    chantix: "psych effects"    Past Surgical History:  Procedure Laterality Date  . COLONOSCOPY  2005   Recall 10 yrs ([place])  . LOOP RECORDER INSERTION N/A 07/03/2019   Procedure: LOOP RECORDER INSERTION;  Surgeon: Thompson Grayer, MD;  Location: [facility];  Service: Cardiovascular;  Laterality: N/A;  . removal of kidney stones  2006   cystoscopic--removed from ureter.  No prob since.    There were no vitals filed for this visit.   Subjective Assessment - 07/06/20 1323    Subjective No new changes/complaints. No falls. No pain  today.    Patient is accompained by: Family member   spouse   Pertinent History PMH: HTN, alcohol abuse (ongoing use), HLD, GERD, PUD, tobacco use    Limitations Standing;Walking;House hold activities    Patient Stated Goals improve strength of L Leg; Be able to walk without dragging LLE    Currently in Pain? No/denies    Pain Score 0-No pain                [facility] Adult PT Treatment/Exercise - 07/06/20 1324      Transfers   Transfers Sit to Stand;Stand to Sit    Sit to Stand 5: Supervision    Stand to Sit 5: Supervision      Ambulation/Gait   Ambulation/Gait Yes    Ambulation/Gait Assistance 5: Supervision    Ambulation/Gait Assistance Details constant reminder cues for forward gaze due to preference to look down at feet. left toe scuffing noted with no balance issues.    Ambulation Distance (Feet) 120 Feet   x2   Assistive device Rolling walker    Gait Pattern Step-through pattern;Step-to pattern;Decreased stride length;Decreased stance time - right;Decreased step length - left    Ambulation Surface Level;Indoor    Gait velocity 34.13 sec's= 0.96 ft/sec self selected speed      Furniture conservator/restorer   Sit to Stand  Able to stand without using hands and stabilize independently    Standing Unsupported Able to stand safely 2 minutes    Sitting with Back Unsupported but Feet Supported on Floor or Stool Able to sit safely and securely 2 minutes    Stand to Sit Sits safely with minimal use of hands    Transfers Able to transfer safely, definite need of hands    Standing Unsupported with Eyes Closed Able to stand 10 seconds safely    Standing Ubsupported with Feet Together Able to place feet together independently and stand 1 minute safely    From Standing, Reach Forward with Outstretched Arm Can reach forward >12 cm safely (5")   8 inches   From Standing Position, Pick up Object from Floor Able to pick up shoe safely and easily    From Standing Position, Turn to Look Behind Over each  Shoulder Looks behind one side only/other side shows less weight shift   left>right side   Turn 360 Degrees Able to turn 360 degrees safely but slowly    Standing Unsupported, Alternately Place Feet on Step/Stool Able to complete >2 steps/needs minimal assist   min HHA   Standing Unsupported, One Foot in Front Able to take small step independently and hold 30 seconds    Standing on One Leg Tries to lift leg/unable to hold 3 seconds but remains standing independently    Total Score 43      Timed Up and Go Test   TUG Normal TUG    Normal TUG (seconds) 32.31   with RW on 2cd try; 36.52 on 1st try     Self-Care   Self-Care Other Self-Care Comments    Other Self-Care Comments  reviewed HEP with pt. No issues noted or reported with current program.               Balance Exercises - 07/06/20 1350      Balance Exercises: Standing   Standing Eyes Opened Wide (BOA);Foam/compliant surface;Other reps (comment);Limitations    Standing Eyes Opened Limitations on airex: alternating UE raises, then bil UE raises, ending with shouler horizontal abduction all with cues/empahsis/facilitaiton for tall posture with abdominal bracing.    Standing Eyes Closed Wide (BOA);Foam/compliant surface;3 reps;30 secs;Limitations    Standing Eyes Closed Limitations on airex with no UE support: min guard to min assist with cues to correct left lateral lean.               PT Short Term Goals - 07/06/20 2039      PT SHORT TERM GOAL #1   Title Patient will improve TUG to </= 34 seconds with RW to demo reduced fall risk (All STGs Due: 07/05/20)    Baseline 07/06/20: 32.31 sec's with RW    Status Achieved    Target Date 07/05/20      PT SHORT TERM GOAL #2   Title Patient will improve Berg Balance to >/= 39/56 to demonstrate improved balance and reduced fall risk    Baseline 07/06/20: 43/56    Status Achieved      PT SHORT TERM GOAL #3   Title Patient will improve gait speed to >/= 1.25 ft/sec with LRAD to  demo improved mobility    Baseline 07/06/20: 0.96 ft/sec with RW, decreased from 1.14 ft/sec at last assessment    Status Not Met      PT SHORT TERM GOAL #4   Title Patient will be independent with initial HEP for strengthening/balance    Baseline  07/06/20: met with current program    Status Achieved             PT Long Term Goals - 06/07/20 1611      PT LONG TERM GOAL #1   Title Patient will be independent with final strengthening/balance HEP and report completing walking program >/= 20 minutes daily(ALL LTG Due: 08/02/20)    Baseline HEP established at prior POC    Time 8    Period Weeks    Status New    Target Date 08/02/20      PT LONG TERM GOAL #2   Title patient will improve gait speed to >/= 1.4 ft/sec to demonstrate improved community mobility    Baseline 1.14 ft/sec    Time 8    Period Weeks    Status New      PT LONG TERM GOAL #3   Title Patient will demo ability to ambulate >100 ft on outdoor/unlevel surfaces with supervision to demo improved community mobility    Baseline TBA    Time 8    Period Weeks    Status New      PT LONG TERM GOAL #4   Title Patient will improve TUG to </= 28 seconds to demonstrate improved mobility and reduced fall risk    Baseline 37.53 secs    Time 8    Period Weeks    Status New      PT LONG TERM GOAL #5   Title Patient will improve Berg Balance to >/= 45/56 to demonstrate reduced risk for falls    Baseline 36/56    Time 8    Period Weeks    Status New                 Plan - 07/06/20 1324    Clinical Impression Statement Today's skilled session focused on progress toward STGs. Pt has met his HEP, TUG and Furniture conservator/restorer goals. Pt did not meet his 10 meter gait speed goal- scored 0.96 ft/sec today and last assessment was 1.14 ft/sec with RW. Remainder of session continued to focus on posture and balance reactions on complaint surfaces. No issues noted or reported in session. The pt is progressing toward goals and  should benefit from continued PT to progress toward unmet goals.    Personal Factors and Comorbidities Comorbidity 3+    Comorbidities HTN, alcohol abuse (ongoing use), HLD, GERD, PUD, tobacco use    Examination-Activity Limitations Bed Mobility;Dressing;Locomotion Level;Stairs;Stand;Toileting;Transfers;Bathing    Examination-Participation Restrictions Driving;Interpersonal Relationship;Yard Work;Community Activity    Stability/Clinical Decision Making Evolving/Moderate complexity    Rehab Potential Good    PT Frequency 1x / week    PT Duration 8 weeks    PT Treatment/Interventions ADLs/Self Care Home Management;Electrical Stimulation;DME Instruction;Moist Heat;Cryotherapy;Gait training;Stair training;Functional mobility training;Therapeutic activities;Therapeutic exercise;Balance training;Neuromuscular re-education;Patient/family education;Orthotic Fit/Training;Wheelchair mobility training;Passive range of motion;Aquatic Therapy;Vestibular;Manual techniques;Dry needling;Joint Manipulations;Spinal Manipulations    PT Next Visit Plan Potential for Aquatic Therapy? Work on standing balance working toward narrow BOS. gait outdoors. standing weight shift activities    Consulted and Agree with Plan of Care Patient;Family member/caregiver    Family Member Consulted Wife           Patient will benefit from skilled therapeutic intervention in order to improve the following deficits and impairments:  Abnormal gait,Decreased coordination,Difficulty walking,Impaired tone,Decreased safety awareness,Decreased endurance,Decreased activity tolerance,Pain,Decreased balance,Decreased knowledge of use of DME,Postural dysfunction,Decreased strength,Decreased mobility  Visit Diagnosis: Muscle weakness (generalized)  Unsteadiness on feet  Other abnormalities of gait  and mobility  Difficulty in walking, not elsewhere classified     Problem List Patient Active Problem List   Diagnosis Date Noted  . H/O  ETOH abuse 04/28/2020  . History of tobacco abuse 01/07/2020  . Hemiparesis affecting left side as late effect of stroke ([facility]) 01/07/2020  . Abnormality of gait 09/24/2019  . AKI (acute kidney injury) ([facility])   . Fever   . Sepsis without acute organ dysfunction ([place] [facility])   . Elevated BUN   . Blood pressure increase diastolic   . Labile blood pressure   . Leukopenia   . Benign essential HTN   . Hypoalbuminemia   . Cerebrovascular accident (CVA) of right basal ganglia ([facility]) 07/07/2019  . Acute ischemic stroke ([place])   . Dyslipidemia   . Dysphagia, post-stroke   . Polycythemia   . Stroke ([facility]) 06/30/2019  . Prediabetes 12/23/2016  . Elevated LFTs 12/23/2016  . Pure hypercholesterolemia 12/21/2016  . Alcoholism ([facility]) 12/21/2016  . Eczema 09/18/2013  . Essential hypertension 09/18/2013  . Gastroesophageal reflux disease without esophagitis 09/18/2013  . Tobacco abuse 09/18/2013    [name], PTA, [facility] [address], [place], [place] [address] [phone] 07/07/20, 8:46 PM   Name: Russell Russell MRN: [id number] Date of Birth: 07-May-1950

## 2020-07-11 ENCOUNTER — Ambulatory Visit (INDEPENDENT_AMBULATORY_CARE_PROVIDER_SITE_OTHER): Payer: Medicare HMO

## 2020-07-11 DIAGNOSIS — I6340 Cerebral infarction due to embolism of unspecified cerebral artery: Secondary | ICD-10-CM | POA: Diagnosis not present

## 2020-07-12 ENCOUNTER — Ambulatory Visit: Payer: Medicare HMO | Attending: Physician Assistant

## 2020-07-12 ENCOUNTER — Ambulatory Visit: Payer: Medicare HMO | Admitting: Occupational Therapy

## 2020-07-12 ENCOUNTER — Encounter: Payer: Self-pay | Admitting: Occupational Therapy

## 2020-07-12 ENCOUNTER — Other Ambulatory Visit: Payer: Self-pay

## 2020-07-12 DIAGNOSIS — R262 Difficulty in walking, not elsewhere classified: Secondary | ICD-10-CM

## 2020-07-12 DIAGNOSIS — I69352 Hemiplegia and hemiparesis following cerebral infarction affecting left dominant side: Secondary | ICD-10-CM

## 2020-07-12 DIAGNOSIS — M6281 Muscle weakness (generalized): Secondary | ICD-10-CM | POA: Insufficient documentation

## 2020-07-12 DIAGNOSIS — R482 Apraxia: Secondary | ICD-10-CM | POA: Diagnosis not present

## 2020-07-12 DIAGNOSIS — R2689 Other abnormalities of gait and mobility: Secondary | ICD-10-CM | POA: Insufficient documentation

## 2020-07-12 DIAGNOSIS — R208 Other disturbances of skin sensation: Secondary | ICD-10-CM | POA: Insufficient documentation

## 2020-07-12 DIAGNOSIS — R2681 Unsteadiness on feet: Secondary | ICD-10-CM | POA: Insufficient documentation

## 2020-07-12 DIAGNOSIS — R41840 Attention and concentration deficit: Secondary | ICD-10-CM | POA: Diagnosis not present

## 2020-07-12 DIAGNOSIS — R278 Other lack of coordination: Secondary | ICD-10-CM

## 2020-07-12 LAB — CUP PACEART REMOTE DEVICE CHECK
Date Time Interrogation Session: 20220430230254
Implantable Pulse Generator Implant Date: 20210423

## 2020-07-12 NOTE — Therapy (Signed)
[facility] [address] [place], [name], [address] Phone: [phone]   Fax:  [phone]  Physical Therapy Treatment  Patient Details  Name: Russell Russell MRN: [id number] Date of Birth: 1950/08/02 Referring Provider (PT): Jamse Arn, MD   Encounter Date: 07/12/2020   PT End of Session - 07/12/20 1231    Visit Number 5    Number of Visits 9    Date for PT Re-Evaluation 08/06/20   POC for 8 weeks, Cert for 60 days   Authorization Type [employer] (medicare guidelines, 10th Visit PN)    Progress Note Due on Visit 10    PT Start Time 1231    PT Stop Time 1314    PT Time Calculation (min) 43 min    Equipment Utilized During Treatment Gait belt    Activity Tolerance Patient tolerated treatment well    Behavior During Therapy WFL for tasks assessed/performed           Past Medical History:  Diagnosis Date  . Alcohol abuse    drinks 1 gallon of Gin every weekend, nothing during the week x >15 yrs  . Elevated transaminase level    +elev bili: abd u/s 06/2014 showed stable small hepatic hemangiomas, o/w normal.  . Essential hypertension   . GERD (gastroesophageal reflux disease)   . Hyperlipidemia 03/2014   Atorv started-chol improved  . Impaired fasting glucose 03/2014  . Nephrolithiasis   . PUD (peptic ulcer disease)   . Stroke ([facility])   . Tobacco dependence    chantix: "psych effects"    Past Surgical History:  Procedure Laterality Date  . COLONOSCOPY  2005   Recall 10 yrs ([place])  . LOOP RECORDER INSERTION N/A 07/03/2019   Procedure: LOOP RECORDER INSERTION;  Surgeon: Thompson Grayer, MD;  Location: [facility];  Service: Cardiovascular;  Laterality: N/A;  . removal of kidney stones  2006   cystoscopic--removed from ureter.  No prob since.    There were no vitals filed for this visit.   Subjective Assessment - 07/12/20 1235    Subjective No new changes/complaints. No falls to  report.    Patient is accompained by: Family member   spouse   Pertinent History PMH: HTN, alcohol abuse (ongoing use), HLD, GERD, PUD, tobacco use    Limitations Standing;Walking;House hold activities    Patient Stated Goals improve strength of L Leg; Be able to walk without dragging LLE    Currently in Pain? No/denies                             [facility] Adult PT Treatment/Exercise - 07/12/20 0001      Transfers   Transfers Sit to Stand;Stand to Sit    Sit to Stand 5: Supervision    Stand to Sit 5: Supervision      Ambulation/Gait   Ambulation/Gait Yes    Ambulation/Gait Assistance 5: Supervision    Ambulation/Gait Assistance Details Completed gait training outdoors with RW x 450 ft on paved surfaces, PT continue to provide verbal cues for improved step length intermittently and tactile cues for upright posture. PT faciliation for improved posture. Continue to demo increased downward gaze to feet.    Ambulation Distance (Feet) 450 Feet    Assistive device Rolling walker    Gait Pattern Step-through pattern;Step-to pattern;Decreased stride length;Decreased stance time - right;Decreased step length - left    Ambulation Surface Unlevel;Outdoor;Paved  Curb 5: Supervision    Curb Details (indicate cue type and reason) with RW completed ascend/descend curb x 2 reps. PT providing cues for sequencing but patient able to complete supervsion throughout.      Neuro Re-ed    Neuro Re-ed Details  Standing at stairs on firm surface completed alternating toe taps to 1st step progressing from BUE > single UE > no UE support. Then progressed to 2nd step, unable to complete to second step without UE support. Increased balance challenge and coordination without UE support, completed 2 x 15 reps alternating. To promote hip flexion, completed resisted hip flexion with toe tap and light UE support to 2nd step x 10 reps bilaterally. Standing on airex completed forward steps up onto 1st step,  2 x 10 reps, progressing from single UE support to no UE support. Only able to complte x 3 reps without UE support. Increased balnace challenge noted. Standing on airex with feet shoulder width completed static standing with eyes open and horizontal/vertical head turns x 10 reps each direction.                    PT Short Term Goals - 07/06/20 2039      PT SHORT TERM GOAL #1   Title Patient will improve TUG to </= 34 seconds with RW to demo reduced fall risk (All STGs Due: 07/05/20)    Baseline 07/06/20: 32.31 sec's with RW    Status Achieved    Target Date 07/05/20      PT SHORT TERM GOAL #2   Title Patient will improve Berg Balance to >/= 39/56 to demonstrate improved balance and reduced fall risk    Baseline 07/06/20: 43/56    Status Achieved      PT SHORT TERM GOAL #3   Title Patient will improve gait speed to >/= 1.25 ft/sec with LRAD to demo improved mobility    Baseline 07/06/20: 0.96 ft/sec with RW, decreased from 1.14 ft/sec at last assessment    Status Not Met      PT SHORT TERM GOAL #4   Title Patient will be independent with initial HEP for strengthening/balance    Baseline 07/06/20: met with current program    Status Achieved             PT Long Term Goals - 06/07/20 1611      PT LONG TERM GOAL #1   Title Patient will be independent with final strengthening/balance HEP and report completing walking program >/= 20 minutes daily(ALL LTG Due: 08/02/20)    Baseline HEP established at prior POC    Time 8    Period Weeks    Status New    Target Date 08/02/20      PT LONG TERM GOAL #2   Title patient will improve gait speed to >/= 1.4 ft/sec to demonstrate improved community mobility    Baseline 1.14 ft/sec    Time 8    Period Weeks    Status New      PT LONG TERM GOAL #3   Title Patient will demo ability to ambulate >100 ft on outdoor/unlevel surfaces with supervision to demo improved community mobility    Baseline TBA    Time 8    Period Weeks     Status New      PT LONG TERM GOAL #4   Title Patient will improve TUG to </= 28 seconds to demonstrate improved mobility and reduced fall risk    Baseline 37.53  secs    Time 8    Period Weeks    Status New      PT LONG TERM GOAL #5   Title Patient will improve Berg Balance to >/= 45/56 to demonstrate reduced risk for falls    Baseline 36/56    Time 8    Period Weeks    Status New                 Plan - 07/12/20 1326    Clinical Impression Statement Today's skilled PT session included continued gait training with focus on outdoor surfaces and curb management today, continue to require verbal/tactile cues for posture and step length. Continued balance working on improved weight shift, SLS and standing balance on complaint surfaces. Intermittent rest breaks required. Will continue to progress toward all LTGs.    Personal Factors and Comorbidities Comorbidity 3+    Comorbidities HTN, alcohol abuse (ongoing use), HLD, GERD, PUD, tobacco use    Examination-Activity Limitations Bed Mobility;Dressing;Locomotion Level;Stairs;Stand;Toileting;Transfers;Bathing    Examination-Participation Restrictions Driving;Interpersonal Relationship;Yard Work;Community Activity    Stability/Clinical Decision Making Evolving/Moderate complexity    Rehab Potential Good    PT Frequency 1x / week    PT Duration 8 weeks    PT Treatment/Interventions ADLs/Self Care Home Management;Electrical Stimulation;DME Instruction;Moist Heat;Cryotherapy;Gait training;Stair training;Functional mobility training;Therapeutic activities;Therapeutic exercise;Balance training;Neuromuscular re-education;Patient/family education;Orthotic Fit/Training;Wheelchair mobility training;Passive range of motion;Aquatic Therapy;Vestibular;Manual techniques;Dry needling;Joint Manipulations;Spinal Manipulations    PT Next Visit Plan Primary PT to fill out referral for Aquatic Therapy.  Work on standing balance working toward narrow BOS. gait  outdoors. standing weight shift activities    Consulted and Agree with Plan of Care Patient;Family member/caregiver    Family Member Consulted Wife           Patient will benefit from skilled therapeutic intervention in order to improve the following deficits and impairments:  Abnormal gait,Decreased coordination,Difficulty walking,Impaired tone,Decreased safety awareness,Decreased endurance,Decreased activity tolerance,Pain,Decreased balance,Decreased knowledge of use of DME,Postural dysfunction,Decreased strength,Decreased mobility  Visit Diagnosis: Muscle weakness (generalized)  Unsteadiness on feet  Other abnormalities of gait and mobility  Difficulty in walking, not elsewhere classified     Problem List Patient Active Problem List   Diagnosis Date Noted  . H/O ETOH abuse 04/28/2020  . History of tobacco abuse 01/07/2020  . Hemiparesis affecting left side as late effect of stroke ([facility]) 01/07/2020  . Abnormality of gait 09/24/2019  . AKI (acute kidney injury) ([facility])   . Fever   . Sepsis without acute organ dysfunction ([facility])   . Elevated BUN   . Blood pressure increase diastolic   . Labile blood pressure   . Leukopenia   . Benign essential HTN   . Hypoalbuminemia   . Cerebrovascular accident (CVA) of right basal ganglia ([facility]) 07/07/2019  . Acute ischemic stroke ([facility])   . Dyslipidemia   . Dysphagia, post-stroke   . Polycythemia   . Stroke ([facility]) 06/30/2019  . Prediabetes 12/23/2016  . Elevated LFTs 12/23/2016  . Pure hypercholesterolemia 12/21/2016  . Alcoholism ([facility]) 12/21/2016  . Eczema 09/18/2013  . Essential hypertension 09/18/2013  . Gastroesophageal reflux disease without esophagitis 09/18/2013  . Tobacco abuse 09/18/2013    Jones Bales, PT, DPT 07/12/2020, 1:29 PM  [facility] [address] [place], [name], [address] Phone: [phone]   Fax:  [phone]  Name: Russell Russell MRN:  [id number] Date of Birth: 1951-02-04

## 2020-07-12 NOTE — Therapy (Signed)
[facility] [address] [place], [name], [address] Phone: [phone]   Fax:  [phone]  Occupational Therapy Treatment  Patient Details  Name: Russell Russell MRN: [id number] Date of Birth: 1951/01/03 Referring Provider (OT): Delice Lesch   Encounter Date: 07/12/2020   OT End of Session - 07/12/20 1408    Visit Number 5    Number of Visits 9    Date for OT Re-Evaluation 08/21/20    Authorization Type [employer] - need auth    OT Start Time 1315    OT Stop Time 1400    OT Time Calculation (min) 45 min    Activity Tolerance Patient tolerated treatment well    Behavior During Therapy [name] for tasks assessed/performed           Past Medical History:  Diagnosis Date  . Alcohol abuse    drinks 1 gallon of Gin every weekend, nothing during the week x >15 yrs  . Elevated transaminase level    +elev bili: abd u/s 06/2014 showed stable small hepatic hemangiomas, o/w normal.  . Essential hypertension   . GERD (gastroesophageal reflux disease)   . Hyperlipidemia 03/2014   Atorv started-chol improved  . Impaired fasting glucose 03/2014  . Nephrolithiasis   . PUD (peptic ulcer disease)   . Stroke ([place])   . Tobacco dependence    chantix: "psych effects"    Past Surgical History:  Procedure Laterality Date  . COLONOSCOPY  2005   Recall 10 yrs ([place])  . LOOP RECORDER INSERTION N/A 07/03/2019   Procedure: LOOP RECORDER INSERTION;  Surgeon: Thompson Grayer, MD;  Location: [facility];  Service: Cardiovascular;  Laterality: N/A;  . removal of kidney stones  2006   cystoscopic--removed from ureter.  No prob since.    There were no vitals filed for this visit.   Subjective Assessment - 07/12/20 1323    Subjective  Closing the car door - now with left hand    Patient is accompanied by: Family member    Pertinent History HTN, ETOH use, smoker, loop recorder    Currently in Pain? No/denies    Pain Score  0-No pain                        OT Treatments/Exercises (OP) - 07/12/20 1404      Neurological Re-education Exercises   Other Exercises 1 Neuromuscular reeducation to address in hand manipulation, intelimb coordination, motor planning for LUE, Postural control and shoulder range of motion. Patient progressing toward OT goals.                    OT Short Term Goals - 07/12/20 1324      OT SHORT TERM GOAL #1   Title Patient will compelte a home exercise program designed to improve functional use of LUE due 5/13    Time 4    Period Weeks    Status Achieved      OT SHORT TERM GOAL #2   Title Patient will report decrease in pain with passive range of motion beyond 80 degrees of shoulder flexion    Status Achieved      OT SHORT TERM GOAL #3   Title Patient will demonstrate 3 lb increase in grip strength in LUE    Baseline 20    Time 4    Period Weeks    Status Achieved  OT Long Term Goals - 07/12/20 1346      OT LONG TERM GOAL #1   Title Patient will complete an updated HEP to address LUE functioning DUE 08/21/20    Time 8    Period Weeks    Status Achieved      OT LONG TERM GOAL #2   Title Patient will transfer into shower with supervision    Baseline min assist    Time 8    Period Weeks    Status New      OT LONG TERM GOAL #3   Title Patient will demonstrate 10 lb increase in grip strength in LUE    Baseline 20    Time 8    Period Weeks    Status New      OT LONG TERM GOAL #4   Title Patient will demonstrate ability to manipulate small items within left hand with increased time, e.g. change    Time 8    Period Weeks    Status New      OT LONG TERM GOAL #5   Title Patient will demonstrate 120 degrees of active assisted shoulder flexion without report of increased pain    Time 8    Period Weeks    Status New      OT LONG TERM GOAL #6   Title xxx      OT LONG TERM GOAL #7   Title xxx      OT LONG TERM GOAL #8    Title xxx                 Plan - 07/12/20 1408    Clinical Impression Statement Pt continues with improved motor control and functional use of LUE.  Still limited by decreased sensation, postural control, and balance/motor planning - gross motor.    OT Occupational Profile and History Detailed Assessment- Review of Records and additional review of physical, cognitive, psychosocial history related to current functional performance    Occupational performance deficits (Please refer to evaluation for details): ADL's;IADL's;Rest and Sleep;Leisure    Body Structure / Function / Physical Skills ADL;Coordination;Endurance;GMC;UE functional use;Sensation;Decreased knowledge of precautions;Balance;Body mechanics;Decreased knowledge of use of DME;Flexibility;IADL;Pain;Vision;Cardiopulmonary status limiting activity;Dexterity;FMC;Proprioception;Strength;Tone;ROM;Muscle spasms;Mobility    Cognitive Skills Attention;Energy/Drive;Learn;Sequencing;Safety Awareness;Problem Solve;Perception    Psychosocial Skills Habits;Routines and Behaviors    Rehab Potential Good    Clinical Decision Making Limited treatment options, no task modification necessary    Comorbidities Affecting Occupational Performance: May have comorbidities impacting occupational performance    Modification or Assistance to Complete Evaluation  No modification of tasks or assist necessary to complete eval    OT Frequency 1x / week    OT Duration 8 weeks    OT Treatment/Interventions Self-care/ADL training;Electrical Stimulation;Therapeutic exercise;Visual/perceptual remediation/compensation;Patient/family education;Splinting;Neuromuscular education;Moist Heat;Aquatic Therapy;Balance training;Therapeutic activities;Functional Mobility Training;Fluidtherapy;DME and/or AE instruction;Manual Therapy;Cognitive remediation/compensation;Passive range of motion;Contrast Bath;Ultrasound;Cryotherapy    Plan coordination left hand, interlimb -  hand,wrist, forearm, elbow motion    OT Home Exercise Plan coordination HEP, putty red - grip and pinch - from prior episode of care    Consulted and Agree with Plan of Care Patient;Family member/caregiver    Family Member Consulted Wife, Russell Russell           Patient will benefit from skilled therapeutic intervention in order to improve the following deficits and impairments:   Body Structure / Function / Physical Skills: ADL,Coordination,Endurance,GMC,UE functional use,Sensation,Decreased knowledge of precautions,Balance,Body mechanics,Decreased knowledge of use of DME,Flexibility,IADL,Pain,Vision,Cardiopulmonary status limiting activity,Dexterity,FMC,Proprioception,Strength,Tone,ROM,Muscle spasms,Mobility Cognitive Skills: Attention,Energy/Drive,Learn,Sequencing,Safety Awareness,Problem  Solve,Perception Psychosocial Skills: Habits,Routines and Behaviors   Visit Diagnosis: Apraxia  Other lack of coordination  Attention and concentration deficit  Other disturbances of skin sensation  Muscle weakness (generalized)  Unsteadiness on feet  Hemiplegia and hemiparesis following cerebral infarction affecting left dominant side [facility])    Problem List Patient Active Problem List   Diagnosis Date Noted  . H/O ETOH abuse 04/28/2020  . History of tobacco abuse 01/07/2020  . Hemiparesis affecting left side as late effect of stroke ([place]) 01/07/2020  . Abnormality of gait 09/24/2019  . AKI (acute kidney injury) ([place])   . Fever   . Sepsis without acute organ dysfunction ([place])   . Elevated BUN   . Blood pressure increase diastolic   . Labile blood pressure   . Leukopenia   . Benign essential HTN   . Hypoalbuminemia   . Cerebrovascular accident (CVA) of right basal ganglia ([place]) 07/07/2019  . Acute ischemic stroke ([place])   . Dyslipidemia   . Dysphagia, post-stroke   . Polycythemia   . Stroke ([place]) 06/30/2019  . Prediabetes 12/23/2016  . Elevated LFTs 12/23/2016  . Pure  hypercholesterolemia 12/21/2016  . Alcoholism ([place]) 12/21/2016  . Eczema 09/18/2013  . Essential hypertension 09/18/2013  . Gastroesophageal reflux disease without esophagitis 09/18/2013  . Tobacco abuse 09/18/2013    Mariah Milling, OTR/L 07/12/2020, 2:10 PM  [facility] [address] [place], [name], [address] Phone: [phone]   Fax:  [phone]  Name: Russell Russell MRN: [id number] Date of Birth: [birth year]

## 2020-07-19 ENCOUNTER — Other Ambulatory Visit: Payer: Self-pay

## 2020-07-19 ENCOUNTER — Ambulatory Visit: Payer: Medicare HMO | Admitting: Occupational Therapy

## 2020-07-19 ENCOUNTER — Ambulatory Visit: Payer: Medicare HMO

## 2020-07-19 DIAGNOSIS — R41840 Attention and concentration deficit: Secondary | ICD-10-CM | POA: Diagnosis not present

## 2020-07-19 DIAGNOSIS — R2689 Other abnormalities of gait and mobility: Secondary | ICD-10-CM | POA: Diagnosis not present

## 2020-07-19 DIAGNOSIS — R2681 Unsteadiness on feet: Secondary | ICD-10-CM

## 2020-07-19 DIAGNOSIS — M6281 Muscle weakness (generalized): Secondary | ICD-10-CM

## 2020-07-19 DIAGNOSIS — R482 Apraxia: Secondary | ICD-10-CM | POA: Diagnosis not present

## 2020-07-19 DIAGNOSIS — R262 Difficulty in walking, not elsewhere classified: Secondary | ICD-10-CM | POA: Diagnosis not present

## 2020-07-19 DIAGNOSIS — R278 Other lack of coordination: Secondary | ICD-10-CM

## 2020-07-19 DIAGNOSIS — R208 Other disturbances of skin sensation: Secondary | ICD-10-CM

## 2020-07-19 DIAGNOSIS — I69352 Hemiplegia and hemiparesis following cerebral infarction affecting left dominant side: Secondary | ICD-10-CM | POA: Diagnosis not present

## 2020-07-19 NOTE — Therapy (Signed)
[facility] [address] [place], [name], [address] Phone: [phone]   Fax:  [phone]  Occupational Therapy Treatment  Patient Details  Name: Russell Russell MRN: [id number] Date of Birth: 1950-05-04 Referring Provider (OT): Delice Lesch   Encounter Date: 07/19/2020   OT End of Session - 07/19/20 7322    Visit Number 6    Number of Visits 9    Date for OT Re-Evaluation 08/21/20    Authorization Type [employer] - need auth    Authorization - Number of Visits 26    Progress Note Due on Visit 28    OT Start Time 1318    OT Stop Time 1400    OT Time Calculation (min) 42 min    Activity Tolerance Patient tolerated treatment well    Behavior During Therapy [facility] for tasks assessed/performed           Past Medical History:  Diagnosis Date  . Alcohol abuse    drinks 1 gallon of Gin every weekend, nothing during the week x >15 yrs  . Elevated transaminase level    +elev bili: abd u/s 06/2014 showed stable small hepatic hemangiomas, o/w normal.  . Essential hypertension   . GERD (gastroesophageal reflux disease)   . Hyperlipidemia 03/2014   Atorv started-chol improved  . Impaired fasting glucose 03/2014  . Nephrolithiasis   . PUD (peptic ulcer disease)   . Stroke ([place])   . Tobacco dependence    chantix: "psych effects"    Past Surgical History:  Procedure Laterality Date  . COLONOSCOPY  2005   Recall 10 yrs ([place])  . LOOP RECORDER INSERTION N/A 07/03/2019   Procedure: LOOP RECORDER INSERTION;  Surgeon: Thompson Grayer, MD;  Location: [facility];  Service: Cardiovascular;  Laterality: N/A;  . removal of kidney stones  2006   cystoscopic--removed from ureter.  No prob since.    There were no vitals filed for this visit.                 OT Treatments/Exercises (OP) - 07/19/20 1511      Neurological Re-education Exercises   Other Exercises 1 Neuromuscular reeducation to address  shoulder and elbow movement.  Worked supine, initially to stretch, prep shoulder and elbow toward improved flexion shoulder with elbow extension with at least neutral rotation.  Patient with diminished sesnation and excessive activation, so slow, repetitive movement most helpful to reduce discomfort.  Initiated simple HEP to improve shoulder and elbow motion and to reduce shoulder pain.                  OT Education - 07/19/20 1515    Education Details HEP supine shoulder    Person(s) Educated Patient;Spouse    Methods Explanation;Demonstration;Tactile cues;Verbal cues;Handout    Comprehension Verbalized understanding;Returned demonstration;Need further instruction;Verbal cues required;Tactile cues required            OT Short Term Goals - 07/19/20 1325      OT SHORT TERM GOAL #1   Title Patient will compelte a home exercise program designed to improve functional use of LUE due 5/13    Time 4    Period Weeks    Status Achieved      OT SHORT TERM GOAL #2   Title Patient will report decrease in pain with passive range of motion beyond 80 degrees of shoulder flexion    Status Achieved      OT SHORT TERM GOAL #3  Title Patient will demonstrate 3 lb increase in grip strength in LUE    Baseline 20    Time 4    Period Weeks    Status Achieved             OT Long Term Goals - 07/19/20 1326      OT LONG TERM GOAL #1   Title Patient will complete an updated HEP to address LUE functioning DUE 08/21/20    Time 8    Period Weeks    Status Achieved      OT LONG TERM GOAL #2   Title Patient will transfer into shower with supervision    Baseline min assist    Time 8    Period Weeks    Status On-going      OT LONG TERM GOAL #3   Title Patient will demonstrate 10 lb increase in grip strength in LUE    Baseline 20    Time 8    Period Weeks    Status On-going      OT LONG TERM GOAL #4   Title Patient will demonstrate ability to manipulate small items within left hand  with increased time, e.g. change    Time 8    Period Weeks    Status On-going      OT LONG TERM GOAL #5   Title Patient will demonstrate 120 degrees of active assisted shoulder flexion without report of increased pain    Time 8    Period Weeks    Status Achieved      OT LONG TERM GOAL #6   Title xxx      OT LONG TERM GOAL #7   Title xxx      OT LONG TERM GOAL #8   Title xxx                 Plan - 07/19/20 1516    Clinical Impression Statement Patient  limited by decreased sensation, postural control, and balance/motor planning - gross motor.    OT Occupational Profile and History Detailed Assessment- Review of Records and additional review of physical, cognitive, psychosocial history related to current functional performance    Occupational performance deficits (Please refer to evaluation for details): ADL's;IADL's;Rest and Sleep;Leisure    Body Structure / Function / Physical Skills ADL;Coordination;Endurance;GMC;UE functional use;Sensation;Decreased knowledge of precautions;Balance;Body mechanics;Decreased knowledge of use of DME;Flexibility;IADL;Pain;Vision;Cardiopulmonary status limiting activity;Dexterity;FMC;Proprioception;Strength;Tone;ROM;Muscle spasms;Mobility    Cognitive Skills Attention;Energy/Drive;Learn;Sequencing;Safety Awareness;Problem Solve;Perception    Psychosocial Skills Habits;Routines and Behaviors    Rehab Potential Good    Clinical Decision Making Limited treatment options, no task modification necessary    Comorbidities Affecting Occupational Performance: May have comorbidities impacting occupational performance    Modification or Assistance to Complete Evaluation  No modification of tasks or assist necessary to complete eval    OT Frequency 1x / week    OT Duration 8 weeks    OT Treatment/Interventions Self-care/ADL training;Electrical Stimulation;Therapeutic exercise;Visual/perceptual remediation/compensation;Patient/family  education;Splinting;Neuromuscular education;Moist Heat;Aquatic Therapy;Balance training;Therapeutic activities;Functional Mobility Training;Fluidtherapy;DME and/or AE instruction;Manual Therapy;Cognitive remediation/compensation;Passive range of motion;Contrast Bath;Ultrasound;Cryotherapy    Plan coordination left hand, interlimb - hand,wrist, forearm, elbow motion    OT Home Exercise Plan coordination HEP, putty red - grip and pinch - from prior episode of care    Consulted and Agree with Plan of Care Patient;Family member/caregiver    Family Member Consulted Wife, [name]           Patient will benefit from skilled therapeutic intervention in order to improve the  following deficits and impairments:   Body Structure / Function / Physical Skills: ADL,Coordination,Endurance,GMC,UE functional use,Sensation,Decreased knowledge of precautions,Balance,Body mechanics,Decreased knowledge of use of DME,Flexibility,IADL,Pain,Vision,Cardiopulmonary status limiting activity,Dexterity,FMC,Proprioception,Strength,Tone,ROM,Muscle spasms,Mobility Cognitive Skills: Attention,Energy/Drive,Learn,Sequencing,Safety Awareness,Problem Solve,Perception Psychosocial Skills: Habits,Routines and Behaviors   Visit Diagnosis: Apraxia  Hemiplegia and hemiparesis following cerebral infarction affecting left dominant side (H[facility])  Other disturbances of skin sensation  Attention and concentration deficit  Other lack of coordination  Muscle weakness (generalized)  Unsteadiness on feet    Problem List Patient Active Problem List   Diagnosis Date Noted  . H/O ETOH abuse 04/28/2020  . History of tobacco abuse 01/07/2020  . Hemiparesis affecting left side as late effect of stroke ([place]) 01/07/2020  . Abnormality of gait 09/24/2019  . AKI (acute kidney injury) ([place])   . Fever   . Sepsis without acute organ dysfunction ([place])   . Elevated BUN   . Blood pressure increase diastolic   . Labile blood pressure   .  Leukopenia   . Benign essential HTN   . Hypoalbuminemia   . Cerebrovascular accident (CVA) of right basal ganglia ([place]) 07/07/2019  . Acute ischemic stroke ([place])   . Dyslipidemia   . Dysphagia, post-stroke   . Polycythemia   . Stroke ([place]) 06/30/2019  . Prediabetes 12/23/2016  . Elevated LFTs 12/23/2016  . Pure hypercholesterolemia 12/21/2016  . Alcoholism ([place]) 12/21/2016  . Eczema 09/18/2013  . Essential hypertension 09/18/2013  . Gastroesophageal reflux disease without esophagitis 09/18/2013  . Tobacco abuse 09/18/2013    Mariah Milling 07/19/2020, 3:18 PM  [facility] [address] [place], [name], [address] Phone: [phone]   Fax:  [phone]  Name: Russell Russell MRN: [id number] Date of Birth: [birth year]

## 2020-07-19 NOTE — Patient Instructions (Signed)
Lying on your back - complete these exercises twice daily.   Holding a paper towel tube between your open hands.    1)  Chest press  - press hands from chest upward toward ceiling - repeat 10 times Concentrate on smoothness to movement - move slow and controlled.  2)  Press hands up toward ceiling so arms are straight - then slowly reach arms overhead  - go slowly to allow stretch.  Stay within area that is not painful,  Repeat 10 times.

## 2020-07-19 NOTE — Patient Instructions (Signed)
  Aquatic Therapy: What to Expect!  Where:  [facility] [address] [place], [place]  [address] [phone]  NOTE:  You will receive an automated phone message reminding you of your appointment and it will say the appointment is at the Re[facility] on [address]  We are working to fix this- just know that you will meet us at the pool!  How to Prepare: . Please make sure you drink 8 ounces of water about one hour prior to your pool session . A caregiver MUST attend the entire session with the patient.  The caregiver will be responsible for assisting with dressing as well as any toileting needs.  If the patient will be doing a home program this should likely be the person who will assist as well.  . Patients must wear either their street shoes or pool shoes until they are ready to enter the pool with the therapist.  Patients must also wear either street shoes or pool shoes once exiting the pool to walk to the locker room.  This will helps us prevent slips and falls.  . Please arrive 15 minutes early to prepare for your pool therapy session . Sign in at the front desk on the clipboard marked for [place] . You may use the locker rooms on your right and then enter directly into the recreation pool (NOT the competition pool) . Please make sure to attend to any toileting needs prior to entering the pool . Please be dressed in your swim suit and on the pool deck at least 5 minutes before your appointment . Once on the pool deck your therapist will ask you to sign the Patient  Consent and Assignment of Benefits form . Your therapist may take your blood pressure prior to, during and after your session if indicated  About the pool  and parking: 1. Entering the pool Your therapist will assist you; there are 2 ways to enter:  stairs with railings or with a chair lift.   Your therapist will determine the most appropriate way for you. 2. Water temperature is usually  between 86-87 degrees 3. There may be other swimmers in the pool at the same time 4. Parking is free.   Contact Info:     Appointments: [facility]  All sessions are 45 minutes   [address]  [address]     Please call the [facility] if   [place], [place]   [address]    you need to cancel or reschedule an appointment.  [phone]       

## 2020-07-19 NOTE — Therapy (Signed)
[facility] [address] [place], [name], [address] Phone: [phone]   Fax:  [phone]  Physical Therapy Treatment  Patient Details  Name: Russell Russell MRN: [id number] Date of Birth: October 22, 1950 Referring Provider (PT): Jamse Arn, MD   Encounter Date: 07/19/2020   PT End of Session - 07/19/20 1231    Visit Number 6    Number of Visits 9    Date for PT Re-Evaluation 08/06/20   POC for 8 weeks, Cert for 60 days   Authorization Type [employer] (medicare guidelines, 10th Visit PN)    Progress Note Due on Visit 10    PT Start Time 1231    PT Stop Time 1314    PT Time Calculation (min) 43 min    Equipment Utilized During Treatment Gait belt    Activity Tolerance Patient tolerated treatment well    Behavior During Therapy WFL for tasks assessed/performed           Past Medical History:  Diagnosis Date  . Alcohol abuse    drinks 1 gallon of Gin every weekend, nothing during the week x >15 yrs  . Elevated transaminase level    +elev bili: abd u/s 06/2014 showed stable small hepatic hemangiomas, o/w normal.  . Essential hypertension   . GERD (gastroesophageal reflux disease)   . Hyperlipidemia 03/2014   Atorv started-chol improved  . Impaired fasting glucose 03/2014  . Nephrolithiasis   . PUD (peptic ulcer disease)   . Stroke ([place])   . Tobacco dependence    chantix: "psych effects"    Past Surgical History:  Procedure Laterality Date  . COLONOSCOPY  2005   Recall 10 yrs ([place])  . LOOP RECORDER INSERTION N/A 07/03/2019   Procedure: LOOP RECORDER INSERTION;  Surgeon: Thompson Grayer, MD;  Location: [facility];  Service: Cardiovascular;  Laterality: N/A;  . removal of kidney stones  2006   cystoscopic--removed from ureter.  No prob since.    There were no vitals filed for this visit.   Subjective Assessment - 07/19/20 1231    Subjective No new changes. No falls. Is asking about  the use of a rollator.    Patient is accompained by: Family member   spouse   Pertinent History PMH: HTN, alcohol abuse (ongoing use), HLD, GERD, PUD, tobacco use    Limitations Standing;Walking;House hold activities    Patient Stated Goals improve strength of L Leg; Be able to walk without dragging LLE    Currently in Pain? No/denies                [facility] Adult PT Treatment/Exercise - 07/19/20 0001      Transfers   Transfers Sit to Stand;Stand to Sit    Sit to Stand 5: Supervision    Stand to Sit 5: Supervision      Ambulation/Gait   Ambulation/Gait Yes    Ambulation/Gait Assistance 5: Supervision    Ambulation/Gait Assistance Details ambulation throughout clinic with activities, continued cues for upright posture.    Ambulation Distance (Feet) --   clinic distance   Assistive device Rolling walker    Gait Pattern Step-through pattern;Step-to pattern;Decreased stride length;Decreased stance time - right;Decreased step length - left    Ambulation Surface Level;Indoor    Gait Comments Patient tasking about use of rollator, PT educating on concerns for control of AD due to heavy forward lean with current RW. Patient/spouse verbalize understanding.      High Level Balance  High Level Balance Activities Backward walking;Marching forwards    High Level Balance Comments In // bars: with intermittent UE support completed marching forwards followed by backwards walking. slow progression of redued UE support. Small step length noted without UE support with backwards walking, cues required.      Self-Care   Self-Care Other Self-Care Comments    Other Self-Care Comments  PT educating on Aquatic "What to Expect" and addressed any questions/concerns. Handout provided.      Neuro Re-ed    Neuro Re-ed Details  Completed bean bag toss: initially standing on airex, completed x 10 tosses without UE support required. tactile/verbal cues required for improved posture. Progressed to completing  standing on rockerboard positioned A/P x 10 reps, patient require intermittent UE support after each throw due to increased balance challenge. Completed raching across midline working on improved trunk rotation to B directoins x 15 reps to R/L, increased challenge with reaching across midline with LLE. Then progressed to completing forward reaching and working on improved hip strategy to maintain balance x 10 reps alternating arm reaching with. Intermittent rest breaks required.                  PT Education - 07/19/20 1318    Education Details Aquatic What to Expect    Person(s) Educated Patient;Spouse    Methods Handout;Explanation    Comprehension Verbalized understanding            PT Short Term Goals - 07/06/20 2039      PT SHORT TERM GOAL #1   Title Patient will improve TUG to </= 34 seconds with RW to demo reduced fall risk (All STGs Due: 07/05/20)    Baseline 07/06/20: 32.31 sec's with RW    Status Achieved    Target Date 07/05/20      PT SHORT TERM GOAL #2   Title Patient will improve Berg Balance to >/= 39/56 to demonstrate improved balance and reduced fall risk    Baseline 07/06/20: 43/56    Status Achieved      PT SHORT TERM GOAL #3   Title Patient will improve gait speed to >/= 1.25 ft/sec with LRAD to demo improved mobility    Baseline 07/06/20: 0.96 ft/sec with RW, decreased from 1.14 ft/sec at last assessment    Status Not Met      PT SHORT TERM GOAL #4   Title Patient will be independent with initial HEP for strengthening/balance    Baseline 07/06/20: met with current program    Status Achieved             PT Long Term Goals - 06/07/20 1611      PT LONG TERM GOAL #1   Title Patient will be independent with final strengthening/balance HEP and report completing walking program >/= 20 minutes daily(ALL LTG Due: 08/02/20)    Baseline HEP established at prior POC    Time 8    Period Weeks    Status New    Target Date 08/02/20      PT LONG TERM GOAL #2    Title patient will improve gait speed to >/= 1.4 ft/sec to demonstrate improved community mobility    Baseline 1.14 ft/sec    Time 8    Period Weeks    Status New      PT LONG TERM GOAL #3   Title Patient will demo ability to ambulate >100 ft on outdoor/unlevel surfaces with supervision to demo improved community mobility    Baseline  TBA    Time 8    Period Weeks    Status New      PT LONG TERM GOAL #4   Title Patient will improve TUG to </= 28 seconds to demonstrate improved mobility and reduced fall risk    Baseline 37.53 secs    Time 8    Period Weeks    Status New      PT LONG TERM GOAL #5   Title Patient will improve Berg Balance to >/= 45/56 to demonstrate reduced risk for falls    Baseline 36/56    Time 8    Period Weeks    Status New                 Plan - 07/19/20 1318    Clinical Impression Statement Today's skilled PT session included continued high level balance working on improved weight shift and trunk rotaiton, as well as balance with reduced UE support. Increased challenge with bean bag toss on rockerboard today. PT educating on What to Expect Handout for Aquatics, as expected to participate in Aquatic PT. Will continue to progress toward all LTGs.    Personal Factors and Comorbidities Comorbidity 3+    Comorbidities HTN, alcohol abuse (ongoing use), HLD, GERD, PUD, tobacco use    Examination-Activity Limitations Bed Mobility;Dressing;Locomotion Level;Stairs;Stand;Toileting;Transfers;Bathing    Examination-Participation Restrictions Driving;Interpersonal Relationship;Yard Work;Community Activity    Stability/Clinical Decision Making Evolving/Moderate complexity    Rehab Potential Good    PT Frequency 1x / week    PT Duration 8 weeks    PT Treatment/Interventions ADLs/Self Care Home Management;Electrical Stimulation;DME Instruction;Moist Heat;Cryotherapy;Gait training;Stair training;Functional mobility training;Therapeutic activities;Therapeutic  exercise;Balance training;Neuromuscular re-education;Patient/family education;Orthotic Fit/Training;Wheelchair mobility training;Passive range of motion;Aquatic Therapy;Vestibular;Manual techniques;Dry needling;Joint Manipulations;Spinal Manipulations    PT Next Visit Plan Primary PT to fill out referral for Aquatic Therapy.  Work on standing balance working toward narrow BOS. gait outdoors. standing weight shift activities    Consulted and Agree with Plan of Care Patient;Family member/caregiver    Family Member Consulted Wife           Patient will benefit from skilled therapeutic intervention in order to improve the following deficits and impairments:  Abnormal gait,Decreased coordination,Difficulty walking,Impaired tone,Decreased safety awareness,Decreased endurance,Decreased activity tolerance,Pain,Decreased balance,Decreased knowledge of use of DME,Postural dysfunction,Decreased strength,Decreased mobility  Visit Diagnosis: Muscle weakness (generalized)  Unsteadiness on feet  Other abnormalities of gait and mobility     Problem List Patient Active Problem List   Diagnosis Date Noted  . H/O ETOH abuse 04/28/2020  . History of tobacco abuse 01/07/2020  . Hemiparesis affecting left side as late effect of stroke ([place]) 01/07/2020  . Abnormality of gait 09/24/2019  . AKI (acute kidney injury) ([place])   . Fever   . Sepsis without acute organ dysfunction ([place])   . Elevated BUN   . Blood pressure increase diastolic   . Labile blood pressure   . Leukopenia   . Benign essential HTN   . Hypoalbuminemia   . Cerebrovascular accident (CVA) of right basal ganglia ([place]) 07/07/2019  . Acute ischemic stroke ([place])   . Dyslipidemia   . Dysphagia, post-stroke   . Polycythemia   . Stroke ([place]) 06/30/2019  . Prediabetes 12/23/2016  . Elevated LFTs 12/23/2016  . Pure hypercholesterolemia 12/21/2016  . Alcoholism ([place]) 12/21/2016  . Eczema 09/18/2013  . Essential hypertension 09/18/2013  .  Gastroesophageal reflux disease without esophagitis 09/18/2013  . Tobacco abuse 09/18/2013    Jones Bales, PT, DPT 07/19/2020, 2:08 PM  [facility] [address] [place], [name], [address] Phone: [phone]   Fax:  [phone]  Name: Russell Russell MRN: [id number] Date of Birth: Jan 01, 1951

## 2020-07-20 DIAGNOSIS — H524 Presbyopia: Secondary | ICD-10-CM | POA: Diagnosis not present

## 2020-07-20 DIAGNOSIS — Z0100 Encounter for examination of eyes and vision without abnormal findings: Secondary | ICD-10-CM | POA: Diagnosis not present

## 2020-07-26 ENCOUNTER — Encounter: Payer: Self-pay | Admitting: Physical Medicine & Rehabilitation

## 2020-07-26 ENCOUNTER — Other Ambulatory Visit: Payer: Self-pay

## 2020-07-26 ENCOUNTER — Encounter: Payer: Medicare HMO | Attending: Physical Medicine & Rehabilitation | Admitting: Physical Medicine & Rehabilitation

## 2020-07-26 VITALS — BP 131/89 | HR 90 | Temp 97.6°F | Ht 70.0 in | Wt 184.0 lb

## 2020-07-26 DIAGNOSIS — Z87891 Personal history of nicotine dependence: Secondary | ICD-10-CM

## 2020-07-26 DIAGNOSIS — I10 Essential (primary) hypertension: Secondary | ICD-10-CM

## 2020-07-26 DIAGNOSIS — I6381 Other cerebral infarction due to occlusion or stenosis of small artery: Secondary | ICD-10-CM | POA: Diagnosis not present

## 2020-07-26 DIAGNOSIS — I69354 Hemiplegia and hemiparesis following cerebral infarction affecting left non-dominant side: Secondary | ICD-10-CM

## 2020-07-26 DIAGNOSIS — Z720 Tobacco use: Secondary | ICD-10-CM

## 2020-07-26 DIAGNOSIS — R269 Unspecified abnormalities of gait and mobility: Secondary | ICD-10-CM

## 2020-07-26 NOTE — Progress Notes (Signed)
Subjective:    Patient ID: Russell Russell, male    DOB: 05-04-1950, 70 y.o.   MRN: [id number]   HPI Right-handed male with history of alcohol and tobacco use, hyperlipidemia presents for hospital follow-up after receiving CIR for multiple anterior and posterior right brain infarcts.  Wife supplements history.  Last clinic visit on 04/28/20.  Since that time, pt states he is back in therapies.  BP is controlled. He is smoking ~5/day. Denies falls.  Pain Inventory Average Pain 0 Pain Right Now 0 My pain is no pain but left leg side weakness in the hand, arm & legs.  In the last 24 hours, has pain interfered with the following? General activity 3 Relation with others 0 Enjoyment of life 0 What TIME of day is your pain at its worst? Left side weakness all the time. Sleep (in general) Good  Pain is worse with: No pain just left side weakness. Pain improves with: No pain. Relief from Meds: no pain.   Mobility use a walker ability to climb steps?  yes do you drive?  no    Bowel 7 stools per week  Bladder Normal     Family History  Problem Relation Age of Onset  . Colon cancer Mother   . Cancer Father   . Breast cancer Sister   . Breast cancer Sister    Social History   Socioeconomic History  . Marital status: Married    Spouse name: Not on file  . Number of children: Not on file  . Years of education: Not on file  . Highest education level: Not on file  Occupational History  . Not on file  Tobacco Use  . Smoking status: Former Smoker    Packs/day: 0.50    Years: 20.00    Pack years: 10.00    Types: Cigarettes  . Smokeless tobacco: Never Used  Vaping Use  . Vaping Use: Never used  Substance and Sexual Activity  . Alcohol use: Yes    Alcohol/week: 8.0 standard drinks    Types: 8 Cans of beer per week    Comment: Weekends only  . Drug use: No  . Sexual activity: Not on file  Other Topics Concern  . Not on file  Social History Narrative   Married, no  children.   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   Tob: 10 pack-yr hx (current as of 03/2014).   Alcohol: 8 beers and pint of whisky on weekends only.   Denies hx of prob with drugs or alcohol.   Social Determinants of Health   Financial Resource Strain: Not on file  Food Insecurity: Not on file  Transportation Needs: Not on file  Physical Activity: Not on file  Stress: Not on file  Social Connections: Not on file   Past Surgical History:  Procedure Laterality Date  . COLONOSCOPY  2005   Recall 10 yrs ([place])  . LOOP RECORDER INSERTION N/A 07/03/2019   Procedure: LOOP RECORDER INSERTION;  Surgeon: Thompson Grayer, MD;  Location: [facility];  Service: Cardiovascular;  Laterality: N/A;  . removal of kidney stones  2006   cystoscopic--removed from ureter.  No prob since.   Past Medical History:  Diagnosis Date  . Alcohol abuse    drinks 1 gallon of Gin every weekend, nothing during the week x >15 yrs  . Elevated transaminase level    +elev bili: abd u/s 06/2014 showed stable small hepatic  hemangiomas, o/w normal.  . Essential hypertension   . GERD (gastroesophageal reflux disease)   . Hyperlipidemia 03/2014   Atorv started-chol improved  . Impaired fasting glucose 03/2014  . Nephrolithiasis   . PUD (peptic ulcer disease)   . Stroke ([place])   . Tobacco dependence    chantix: "psych effects"   BP 131/89   Pulse 90   Temp 97.6 F (36.4 C)   Ht 5\' 10"  (1.778 m)   Wt 184 lb (83.5 kg)   SpO2 97%   BMI 26.40 kg/m   Opioid Risk Score:   Fall Risk Score:  `1  Depression screen PHQ 2/9  Depression screen [facility] 2/9 07/26/2020 04/28/2020 01/07/2020 08/18/2019 08/17/2019 02/24/2019 12/21/2017  Decreased Interest 0 0 0 0 1 0 0  Down, Depressed, Hopeless 0 0 0 0 0 0 0  PHQ - 2 Score 0 0 0 0 1 0 0  Altered sleeping - - - - 0 - -  Tired, decreased energy - - - - 0 - -  Change in appetite - - - - 0 - -  Feeling bad or failure about  yourself  - - - - 0 - -  Trouble concentrating - - - - 0 - -  Moving slowly or fidgety/restless - - - - 0 - -  Suicidal thoughts - - - - 0 - -  PHQ-9 Score - - - - 1 - -  Some recent data might be hidden    Review of Systems  Constitutional: Negative.   HENT: Negative.   Eyes: Negative.   Respiratory: Negative.   Cardiovascular: Negative.   Gastrointestinal: Negative.   Endocrine: Negative.   Genitourinary: Negative.   Musculoskeletal: Positive for gait problem. Negative for arthralgias and back pain.  Skin: Negative.   Allergic/Immunologic: Negative.   Neurological: Positive for weakness. Negative for dizziness, numbness and headaches.  Hematological: Negative.   Psychiatric/Behavioral: Negative.   All other systems reviewed and are negative.     Objective:   Physical Exam  Constitutional: No distress . Vital signs reviewed. HENT: Normocephalic.  Atraumatic. Eyes: EOMI. No discharge. Cardiovascular: No JVD.   Respiratory: Normal effort.  No stridor.   GI: Non-distended.   Skin: Warm and dry.  Intact. Psych: Normal mood.  Normal behavior. Musc: No edema in extremities.  No tenderness in extremities. Neuro: Alert Motor: Left upper extremity: Shoulder abduction 4+/5, distally 4+/5, with apraxia and ataxia Left lower extremity: Hip flexion 4+/5, knee extension 4+/5, ankle dorsiflexion 4+/5 Dysarthria, improving Left facial weakness, stable    Assessment & Plan:  Right-handed male with history of alcohol and tobacco use, hyperlipidemia presents for hospital follow-up after receiving CIR for multiple anterior and posterior right brain infarcts.  1.  Left side hemiparesis secondary to multiple scattered anterior and posterior right brain infarcts.  Status post loop recorder             Continue therapies  Continue to follow up with Cards  2.  Blood pressure  Relatively controlled today  3.  History of tobacco and alcohol use.    Started smoking ~5/day, educated  again  Started drinking 6 pack/week, educated on cessation again  4. Gait abnormality  Continue therapies  Continue rolator for safety  Patient prefers to return with PM&R for check ing

## 2020-07-27 ENCOUNTER — Ambulatory Visit: Payer: Medicare HMO

## 2020-07-27 ENCOUNTER — Ambulatory Visit: Payer: Medicare HMO | Admitting: Occupational Therapy

## 2020-07-27 ENCOUNTER — Encounter: Payer: Self-pay | Admitting: Occupational Therapy

## 2020-07-27 DIAGNOSIS — R2681 Unsteadiness on feet: Secondary | ICD-10-CM

## 2020-07-27 DIAGNOSIS — R278 Other lack of coordination: Secondary | ICD-10-CM | POA: Diagnosis not present

## 2020-07-27 DIAGNOSIS — R262 Difficulty in walking, not elsewhere classified: Secondary | ICD-10-CM

## 2020-07-27 DIAGNOSIS — R208 Other disturbances of skin sensation: Secondary | ICD-10-CM

## 2020-07-27 DIAGNOSIS — R2689 Other abnormalities of gait and mobility: Secondary | ICD-10-CM

## 2020-07-27 DIAGNOSIS — M6281 Muscle weakness (generalized): Secondary | ICD-10-CM

## 2020-07-27 DIAGNOSIS — R41840 Attention and concentration deficit: Secondary | ICD-10-CM

## 2020-07-27 DIAGNOSIS — I69352 Hemiplegia and hemiparesis following cerebral infarction affecting left dominant side: Secondary | ICD-10-CM

## 2020-07-27 DIAGNOSIS — R482 Apraxia: Secondary | ICD-10-CM | POA: Diagnosis not present

## 2020-07-27 NOTE — Therapy (Signed)
[facility] [address] [place], [name], [address] Phone: [phone]   Fax:  [phone]  Physical Therapy Treatment  Patient Details  Name: Russell Russell MRN: [id number] Date of Birth: [birth year] Referring Provider (PT): Jamse Arn, MD   Encounter Date: 07/27/2020   PT End of Session - 07/27/20 1314    Visit Number 7    Number of Visits 9    Date for PT Re-Evaluation 08/06/20   POC for 8 weeks, Cert for 60 days   Authorization Type [employer] (medicare guidelines, 10th Visit PN)    Progress Note Due on Visit 10    PT Start Time 1232    PT Stop Time 1314    PT Time Calculation (min) 42 min    Equipment Utilized During Treatment Gait belt    Activity Tolerance Patient tolerated treatment well    Behavior During Therapy [facility] for tasks assessed/performed           Past Medical History:  Diagnosis Date  . Alcohol abuse    drinks 1 gallon of Gin every weekend, nothing during the week x >15 yrs  . Elevated transaminase level    +elev bili: abd u/s 06/2014 showed stable small hepatic hemangiomas, o/w normal.  . Essential hypertension   . GERD (gastroesophageal reflux disease)   . Hyperlipidemia 03/2014   Atorv started-chol improved  . Impaired fasting glucose 03/2014  . Nephrolithiasis   . PUD (peptic ulcer disease)   . Stroke ([place])   . Tobacco dependence    chantix: "psych effects"    Past Surgical History:  Procedure Laterality Date  . COLONOSCOPY  2005   Recall 10 yrs ([place])  . LOOP RECORDER INSERTION N/A 07/03/2019   Procedure: LOOP RECORDER INSERTION;  Surgeon: Thompson Grayer, MD;  Location: [facility];  Service: Cardiovascular;  Laterality: N/A;  . removal of kidney stones  2006   cystoscopic--removed from ureter.  No prob since.    There were no vitals filed for this visit.   Subjective Assessment - 07/27/20 1500    Subjective Appt with Dr. Posey Pronto went well. No changes.     Patient is accompained by: Family member   spouse   Pertinent History PMH: HTN, alcohol abuse (ongoing use), HLD, GERD, PUD, tobacco use    Limitations Standing;Walking;House hold activities    Patient Stated Goals improve strength of L Leg; Be able to walk without dragging LLE    Currently in Pain? No/denies             [facility] Adult PT Treatment/Exercise - 07/27/20 0001      Transfers   Transfers Sit to Stand;Stand to Sit    Sit to Stand 5: Supervision    Stand to Sit 5: Supervision    Number of Reps 1 set;Other reps (comment)   5 reps   Comments Completed sit <> stand training working on reduced UE support and improved posture upon standing.      Ambulation/Gait   Ambulation/Gait Yes    Ambulation/Gait Assistance 5: Supervision    Ambulation/Gait Assistance Details throughout therapy session with activities, cues for step length    Ambulation Distance (Feet) --   clinic distance   Assistive device Rolling walker    Gait Pattern Step-through pattern;Step-to pattern;Decreased stride length;Decreased stance time - right;Decreased step length - left    Ambulation Surface Level;Indoor      Neuro Re-ed    Neuro Re-ed Details  Completed tall  kneeling activities on mat with UE support from bench: completed initially maintaining hip extension and posture with, then progressed to without UE support 3 x 20-30 seconds. Progressed to completing tall kneeling hip hinge and working on improved weight shift to LLE x 10 reps, as patient tends to favor R side. PT providing tactile/verbal cues for facilitation. Patient require setaed rest break, require CGA to return to seated position. Returned to tall kneeling, completed reaching activity to cone on L side, PT faciliating hip extension and lateral weight shift to L, then reaching with LUE to grab cone and return to midline. Completed 3 x 5 reps, with intermittent rest breaks required due to fatigue. Patient require extended rest break after tall  kneeling due to fatigue. On rockerboard in // bars (positioned A/P): completed standing without UE support maintaing board steady 2 x 30 seconds, then progressed to addition of horizontal and vertical head turns 2 x 5 reps to each direction. Increased challnege with vertical, requiring freq CGA - Min A for balance from PT.              PT Short Term Goals - 07/06/20 2039      PT SHORT TERM GOAL #1   Title Patient will improve TUG to </= 34 seconds with RW to demo reduced fall risk (All STGs Due: 07/05/20)    Baseline 07/06/20: 32.31 sec's with RW    Status Achieved    Target Date 07/05/20      PT SHORT TERM GOAL #2   Title Patient will improve Berg Balance to >/= 39/56 to demonstrate improved balance and reduced fall risk    Baseline 07/06/20: 43/56    Status Achieved      PT SHORT TERM GOAL #3   Title Patient will improve gait speed to >/= 1.25 ft/sec with LRAD to demo improved mobility    Baseline 07/06/20: 0.96 ft/sec with RW, decreased from 1.14 ft/sec at last assessment    Status Not Met      PT SHORT TERM GOAL #4   Title Patient will be independent with initial HEP for strengthening/balance    Baseline 07/06/20: met with current program    Status Achieved             PT Long Term Goals - 06/07/20 1611      PT LONG TERM GOAL #1   Title Patient will be independent with final strengthening/balance HEP and report completing walking program >/= 20 minutes daily(ALL LTG Due: 08/02/20)    Baseline HEP established at prior POC    Time 8    Period Weeks    Status New    Target Date 08/02/20      PT LONG TERM GOAL #2   Title patient will improve gait speed to >/= 1.4 ft/sec to demonstrate improved community mobility    Baseline 1.14 ft/sec    Time 8    Period Weeks    Status New      PT LONG TERM GOAL #3   Title Patient will demo ability to ambulate >100 ft on outdoor/unlevel surfaces with supervision to demo improved community mobility    Baseline TBA    Time 8     Period Weeks    Status New      PT LONG TERM GOAL #4   Title Patient will improve TUG to </= 28 seconds to demonstrate improved mobility and reduced fall risk    Baseline 37.53 secs    Time 8  Period Weeks    Status New      PT LONG TERM GOAL #5   Title Patient will improve Berg Balance to >/= 45/56 to demonstrate reduced risk for falls    Baseline 36/56    Time 8    Period Weeks    Status New                 Plan - 07/27/20 1506    Clinical Impression Statement Completed NMR activities in tall kneeling today to work on improved hip extension and weight shift activities. Patient tolerating well overall, but increased fatigue at end of session. Continue to require cues and faciliation for improved weight shift to R. Will continue to progress toward all LTGs.    Personal Factors and Comorbidities Comorbidity 3+    Comorbidities HTN, alcohol abuse (ongoing use), HLD, GERD, PUD, tobacco use    Examination-Activity Limitations Bed Mobility;Dressing;Locomotion Level;Stairs;Stand;Toileting;Transfers;Bathing    Examination-Participation Restrictions Driving;Interpersonal Relationship;Yard Work;Community Activity    Stability/Clinical Decision Making Evolving/Moderate complexity    Rehab Potential Good    PT Frequency 1x / week    PT Duration 8 weeks    PT Treatment/Interventions ADLs/Self Care Home Management;Electrical Stimulation;DME Instruction;Moist Heat;Cryotherapy;Gait training;Stair training;Functional mobility training;Therapeutic activities;Therapeutic exercise;Balance training;Neuromuscular re-education;Patient/family education;Orthotic Fit/Training;Wheelchair mobility training;Passive range of motion;Aquatic Therapy;Vestibular;Manual techniques;Dry needling;Joint Manipulations;Spinal Manipulations    PT Next Visit Plan Check Goals + Re-Cert. Update from Aquatics? Work on standing balance working toward narrow BOS. gait outdoors. standing weight shift activities    Consulted  and Agree with Plan of Care Patient;Family member/caregiver    Family Member Consulted Wife           Patient will benefit from skilled therapeutic intervention in order to improve the following deficits and impairments:  Abnormal gait,Decreased coordination,Difficulty walking,Impaired tone,Decreased safety awareness,Decreased endurance,Decreased activity tolerance,Pain,Decreased balance,Decreased knowledge of use of DME,Postural dysfunction,Decreased strength,Decreased mobility  Visit Diagnosis: Other abnormalities of gait and mobility  Unsteadiness on feet  Muscle weakness (generalized)  Difficulty in walking, not elsewhere classified     Problem List Patient Active Problem List   Diagnosis Date Noted  . H/O ETOH abuse 04/28/2020  . History of tobacco abuse 01/07/2020  . Hemiparesis affecting left side as late effect of stroke ([place]) 01/07/2020  . Abnormality of gait 09/24/2019  . AKI (acute kidney injury) ([place])   . Fever   . Sepsis without acute organ dysfunction ([place])   . Elevated BUN   . Blood pressure increase diastolic   . Labile blood pressure   . Leukopenia   . Benign essential HTN   . Hypoalbuminemia   . Cerebrovascular accident (CVA) of right basal ganglia ([place]) 07/07/2019  . Acute ischemic stroke ([place])   . Dyslipidemia   . Dysphagia, post-stroke   . Polycythemia   . Stroke ([place]) 06/30/2019  . Prediabetes 12/23/2016  . Elevated LFTs 12/23/2016  . Pure hypercholesterolemia 12/21/2016  . Alcoholism ([place]) 12/21/2016  . Eczema 09/18/2013  . Essential hypertension 09/18/2013  . Gastroesophageal reflux disease without esophagitis 09/18/2013  . Tobacco abuse 09/18/2013    Jones Bales, PT, DPT 07/27/2020, 3:08 PM  [facility] [address] [place], [name], [address] Phone: [phone]   Fax:  [phone]  Name: Russell Russell MRN: [id number] Date of Birth: [birth year]

## 2020-07-27 NOTE — Therapy (Signed)
[facility] [address] [place], [name], [address] Phone: [phone]   Fax:  [phone]  Occupational Therapy Treatment  Patient Details  Name: Russell Russell MRN: [id number] Date of Birth: 1950-11-19 Referring Provider (OT): Delice Lesch   Encounter Date: 07/27/2020   OT End of Session - 07/27/20 5277    Visit Number 7    Number of Visits 9    Date for OT Re-Evaluation 08/21/20    Authorization Type [employer] - need auth    OT Start Time 1315    OT Stop Time 1400    OT Time Calculation (min) 45 min    Activity Tolerance Patient tolerated treatment well    Behavior During Therapy [facility] for tasks assessed/performed           Past Medical History:  Diagnosis Date  . Alcohol abuse    drinks 1 gallon of Gin every weekend, nothing during the week x >15 yrs  . Elevated transaminase level    +elev bili: abd u/s 06/2014 showed stable small hepatic hemangiomas, o/w normal.  . Essential hypertension   . GERD (gastroesophageal reflux disease)   . Hyperlipidemia 03/2014   Atorv started-chol improved  . Impaired fasting glucose 03/2014  . Nephrolithiasis   . PUD (peptic ulcer disease)   . Stroke ([place])   . Tobacco dependence    chantix: "psych effects"    Past Surgical History:  Procedure Laterality Date  . COLONOSCOPY  2005   Recall 10 yrs ([place])  . LOOP RECORDER INSERTION N/A 07/03/2019   Procedure: LOOP RECORDER INSERTION;  Surgeon: Thompson Grayer, MD;  Location: [facility];  Service: Cardiovascular;  Laterality: N/A;  . removal of kidney stones  2006   cystoscopic--removed from ureter.  No prob since.    There were no vitals filed for this visit.   Subjective Assessment - 07/27/20 1511    Subjective  Patient and wife indicate that the house will be ready in another week or so.    Patient is accompanied by: Family member    Pertinent History HTN, ETOH use, smoker, loop recorder    Currently  in Pain? No/denies                        OT Treatments/Exercises (OP) - 07/27/20 0001      ADLs   Bathing Simulated shower transfer to determine safest most functional transfer method for patient in new home.  Side stepping was not safe as patient had limited balance without UE support.  Did better stepping in backward where he could use walker as stabilizer outside of shower.  Wife video'd process so she could reinforce once home.      Neurological Re-education Exercises   Other Exercises 1 Neuromuscular reeducation to address dynamic stand balance and functional high reach with LUE.  Working on trunk flexion and rotation to reach around behind him to left or right.  Patient needed cueing to maintain active standing - but not braced against mat table.  Patient needed cueing and facilitation to stand and step forward to extend reach in left arm.                  OT Education - 07/27/20 1515    Education Details Shower transfer techniques    Person(s) Educated Patient;Spouse    Methods Explanation;Demonstration;Tactile cues;Verbal cues;Other (comment)   video   Comprehension Verbalized understanding;Need further instruction;Tactile cues required;Verbal cues required  OT Short Term Goals - 07/27/20 1517      OT SHORT TERM GOAL #1   Title Patient will compelte a home exercise program designed to improve functional use of LUE due 5/13    Time 4    Period Weeks    Status Achieved      OT SHORT TERM GOAL #2   Title Patient will report decrease in pain with passive range of motion beyond 80 degrees of shoulder flexion    Status Achieved      OT SHORT TERM GOAL #3   Title Patient will demonstrate 3 lb increase in grip strength in LUE    Baseline 20    Time 4    Period Weeks    Status Achieved             OT Long Term Goals - 07/27/20 1517      OT LONG TERM GOAL #1   Title Patient will complete an updated HEP to address LUE functioning DUE  08/21/20    Time 8    Period Weeks    Status Achieved      OT LONG TERM GOAL #2   Title Patient will transfer into shower with supervision    Baseline min assist    Time 8    Period Weeks    Status On-going      OT LONG TERM GOAL #3   Title Patient will demonstrate 10 lb increase in grip strength in LUE    Baseline 20    Time 8    Period Weeks    Status On-going      OT LONG TERM GOAL #4   Title Patient will demonstrate ability to manipulate small items within left hand with increased time, e.g. change    Time 8    Period Weeks    Status On-going      OT LONG TERM GOAL #5   Title Patient will demonstrate 120 degrees of active assisted shoulder flexion without report of increased pain    Time 8    Period Weeks    Status Achieved      OT LONG TERM GOAL #6   Title xxx      OT LONG TERM GOAL #7   Title xxx      OT LONG TERM GOAL #8   Title xxx                 Plan - 07/27/20 1516    Clinical Impression Statement Patient able to follow instruction within therapy session, but carryover somewhat limited from session to session    OT Occupational Profile and History Detailed Assessment- Review of Records and additional review of physical, cognitive, psychosocial history related to current functional performance    Occupational performance deficits (Please refer to evaluation for details): ADL's;IADL's;Rest and Sleep;Leisure    Body Structure / Function / Physical Skills ADL;Coordination;Endurance;GMC;UE functional use;Sensation;Decreased knowledge of precautions;Balance;Body mechanics;Decreased knowledge of use of DME;Flexibility;IADL;Pain;Vision;Cardiopulmonary status limiting activity;Dexterity;FMC;Proprioception;Strength;Tone;ROM;Muscle spasms;Mobility    Cognitive Skills Attention;Energy/Drive;Learn;Sequencing;Safety Awareness;Problem Solve;Perception    Psychosocial Skills Habits;Routines and Behaviors    Rehab Potential Good    Clinical Decision Making Limited  treatment options, no task modification necessary    Comorbidities Affecting Occupational Performance: May have comorbidities impacting occupational performance    Modification or Assistance to Complete Evaluation  No modification of tasks or assist necessary to complete eval    OT Frequency 1x / week    OT Duration 8 weeks  OT Treatment/Interventions Self-care/ADL training;Electrical Stimulation;Therapeutic exercise;Visual/perceptual remediation/compensation;Patient/family education;Splinting;Neuromuscular education;Moist Heat;Aquatic Therapy;Balance training;Therapeutic activities;Functional Mobility Training;Fluidtherapy;DME and/or AE instruction;Manual Therapy;Cognitive remediation/compensation;Passive range of motion;Contrast Bath;Ultrasound;Cryotherapy    Plan coordination left hand, interlimb - hand,wrist, forearm, elbow motion    OT Home Exercise Plan coordination HEP, putty red - grip and pinch - from prior episode of care    Consulted and Agree with Plan of Care Patient;Family member/caregiver    Family Member Consulted Wife, [name]           Patient will benefit from skilled therapeutic intervention in order to improve the following deficits and impairments:   Body Structure / Function / Physical Skills: ADL,Coordination,Endurance,GMC,UE functional use,Sensation,Decreased knowledge of precautions,Balance,Body mechanics,Decreased knowledge of use of DME,Flexibility,IADL,Pain,Vision,Cardiopulmonary status limiting activity,Dexterity,FMC,Proprioception,Strength,Tone,ROM,Muscle spasms,Mobility Cognitive Skills: Attention,Energy/Drive,Learn,Sequencing,Safety Awareness,Problem Solve,Perception Psychosocial Skills: Habits,Routines and Behaviors   Visit Diagnosis: Attention and concentration deficit  Other lack of coordination  Muscle weakness (generalized)  Apraxia  Hemiplegia and hemiparesis following cerebral infarction affecting left dominant side (HCC)  Other disturbances of  skin sensation  Unsteadiness on feet    Problem List Patient Active Problem List   Diagnosis Date Noted  . H/O ETOH abuse 04/28/2020  . History of tobacco abuse 01/07/2020  . Hemiparesis affecting left side as late effect of stroke ([place]) 01/07/2020  . Abnormality of gait 09/24/2019  . AKI (acute kidney injury) ([place])   . Fever   . Sepsis without acute organ dysfunction ([place])   . Elevated BUN   . Blood pressure increase diastolic   . Labile blood pressure   . Leukopenia   . Benign essential HTN   . Hypoalbuminemia   . Cerebrovascular accident (CVA) of right basal ganglia ([place]) 07/07/2019  . Acute ischemic stroke ([place])   . Dyslipidemia   . Dysphagia, post-stroke   . Polycythemia   . Stroke ([place]) 06/30/2019  . Prediabetes 12/23/2016  . Elevated LFTs 12/23/2016  . Pure hypercholesterolemia 12/21/2016  . Alcoholism ([place]) 12/21/2016  . Eczema 09/18/2013  . Essential hypertension 09/18/2013  . Gastroesophageal reflux disease without esophagitis 09/18/2013  . Tobacco abuse 09/18/2013    Mariah Milling, OTR/L 07/27/2020, 3:19 PM  [facility] [address] [place], [name], [address] Phone: [phone]   Fax:  [phone]  Name: Russell Russell MRN: [id number] Date of Birth: 1950/12/27

## 2020-07-29 NOTE — Progress Notes (Signed)
[facility]link Summary Report / Loop Recorder 

## 2020-08-02 ENCOUNTER — Ambulatory Visit: Payer: Medicare HMO

## 2020-08-02 ENCOUNTER — Other Ambulatory Visit: Payer: Self-pay

## 2020-08-02 ENCOUNTER — Ambulatory Visit: Payer: Medicare HMO | Admitting: Occupational Therapy

## 2020-08-02 ENCOUNTER — Encounter: Payer: Self-pay | Admitting: Occupational Therapy

## 2020-08-02 DIAGNOSIS — R278 Other lack of coordination: Secondary | ICD-10-CM

## 2020-08-02 DIAGNOSIS — I69352 Hemiplegia and hemiparesis following cerebral infarction affecting left dominant side: Secondary | ICD-10-CM

## 2020-08-02 DIAGNOSIS — M6281 Muscle weakness (generalized): Secondary | ICD-10-CM

## 2020-08-02 DIAGNOSIS — R2689 Other abnormalities of gait and mobility: Secondary | ICD-10-CM

## 2020-08-02 DIAGNOSIS — R482 Apraxia: Secondary | ICD-10-CM

## 2020-08-02 DIAGNOSIS — R41840 Attention and concentration deficit: Secondary | ICD-10-CM | POA: Diagnosis not present

## 2020-08-02 DIAGNOSIS — R2681 Unsteadiness on feet: Secondary | ICD-10-CM | POA: Diagnosis not present

## 2020-08-02 DIAGNOSIS — R262 Difficulty in walking, not elsewhere classified: Secondary | ICD-10-CM

## 2020-08-02 DIAGNOSIS — R208 Other disturbances of skin sensation: Secondary | ICD-10-CM | POA: Diagnosis not present

## 2020-08-02 NOTE — Therapy (Addendum)
[facility] [address] [place], [name], [address] Phone: [phone]   Fax:  [phone]  Physical Therapy Treatment/Re-Certification  Patient Details  Name: Russell Russell MRN: [id number] Date of Birth: May 03, 1950 Referring Provider (PT): Jamse Arn, MD   Encounter Date: 08/02/2020   PT End of Session - 08/02/20 1242    Visit Number 8    Number of Visits 16    Date for PT Re-Evaluation 10/31/20   POC for 8 weeks, Cert for 90 days   Authorization Type [employer] (medicare guidelines, 10th Visit PN)    Authorization Time Period awaiting new authorization    Progress Note Due on Visit 10    PT Start Time 1233    PT Stop Time 1314    PT Time Calculation (min) 41 min    Equipment Utilized During Treatment Gait belt    Activity Tolerance Patient tolerated treatment well    Behavior During Therapy [facility] for tasks assessed/performed           Past Medical History:  Diagnosis Date  . Alcohol abuse    drinks 1 gallon of Gin every weekend, nothing during the week x >15 yrs  . Elevated transaminase level    +elev bili: abd u/s 06/2014 showed stable small hepatic hemangiomas, o/w normal.  . Essential hypertension   . GERD (gastroesophageal reflux disease)   . Hyperlipidemia 03/2014   Atorv started-chol improved  . Impaired fasting glucose 03/2014  . Nephrolithiasis   . PUD (peptic ulcer disease)   . Stroke ([place])   . Tobacco dependence    chantix: "psych effects"    Past Surgical History:  Procedure Laterality Date  . COLONOSCOPY  2005   Recall 10 yrs ([place])  . LOOP RECORDER INSERTION N/A 07/03/2019   Procedure: LOOP RECORDER INSERTION;  Surgeon: Thompson Grayer, MD;  Location: [facility];  Service: Cardiovascular;  Laterality: N/A;  . removal of kidney stones  2006   cystoscopic--removed from ureter.  No prob since.    There were no vitals filed for this visit.   Subjective Assessment  - 08/02/20 1236    Subjective No new changes/complaints. No pain or falls to report.    Patient is accompained by: Family member   spouse   Pertinent History PMH: HTN, alcohol abuse (ongoing use), HLD, GERD, PUD, tobacco use    Limitations Standing;Walking;House hold activities    Patient Stated Goals improve strength of L Leg; Be able to walk without dragging LLE    Currently in Pain? No/denies              [facility] PT Assessment - 08/02/20 0001      Assessment   Medical Diagnosis R Basal Ganglia CVA    Referring Provider (PT) Jamse Arn, MD              [facility] Adult PT Treatment/Exercise - 08/02/20 0001      Transfers   Transfers Sit to Stand;Stand to Sit    Sit to Stand 5: Supervision    Five time sit to stand comments  20.31 secs with BUE support from standard chair    Stand to Sit 5: Supervision      Ambulation/Gait   Ambulation/Gait Yes    Ambulation/Gait Assistance 5: Supervision    Ambulation/Gait Assistance Details completed ambulation indoors x 175 ft, unable to ambulate outdoors today due to weather, supervision overall. cues for upright posture required.    Ambulation Distance (  Feet) 175 Feet    Assistive device Rolling walker    Gait Pattern Step-through pattern;Step-to pattern;Decreased stride length;Decreased stance time - right;Decreased step length - left    Ambulation Surface Level;Indoor    Gait velocity 32.72 = 1.00 ft/sec      Standardized Balance Assessment   Standardized Balance Assessment Berg Balance Test;Timed Up and Go Test      Berg Balance Test   Sit to Stand Able to stand without using hands and stabilize independently    Standing Unsupported Able to stand safely 2 minutes    Sitting with Back Unsupported but Feet Supported on Floor or Stool Able to sit safely and securely 2 minutes    Stand to Sit Sits safely with minimal use of hands    Transfers Able to transfer safely, definite need of hands    Standing Unsupported with Eyes  Closed Able to stand 10 seconds safely    Standing Ubsupported with Feet Together Able to place feet together independently and stand 1 minute safely    From Standing, Reach Forward with Outstretched Arm Can reach forward >12 cm safely (5")   9 inches   From Standing Position, Pick up Object from Floor Able to pick up shoe safely and easily    From Standing Position, Turn to Look Behind Over each Shoulder Looks behind one side only/other side shows less weight shift    Turn 360 Degrees Able to turn 360 degrees safely but slowly    Standing Unsupported, Alternately Place Feet on Step/Stool Able to complete 4 steps without aid or supervision    Standing Unsupported, One Foot in Front Able to take small step independently and hold 30 seconds    Standing on One Leg Tries to lift leg/unable to hold 3 seconds but remains standing independently    Total Score 44      Timed Up and Go Test   TUG Normal TUG    Normal TUG (seconds) 30.15   w/ RW     Exercises   Exercises Knee/Hip    Other Exercises  Reviewed current HEP, continue to demo challenge with activities. verbal cues required. PT provided handout and educated on walking program to promote improved mobility and endurance.                  PT Education - 08/02/20 1349    Education Details progress toward LTG, updated POC, walking program    Person(s) Educated Spouse;Patient    Methods Explanation;Handout    Comprehension Verbalized understanding            PT Short Term Goals - 07/06/20 2039      PT SHORT TERM GOAL #1   Title Patient will improve TUG to </= 34 seconds with RW to demo reduced fall risk (All STGs Due: 07/05/20)    Baseline 07/06/20: 32.31 sec's with RW    Status Achieved    Target Date 07/05/20      PT SHORT TERM GOAL #2   Title Patient will improve Berg Balance to >/= 39/56 to demonstrate improved balance and reduced fall risk    Baseline 07/06/20: 43/56    Status Achieved      PT SHORT TERM GOAL #3    Title Patient will improve gait speed to >/= 1.25 ft/sec with LRAD to demo improved mobility    Baseline 07/06/20: 0.96 ft/sec with RW, decreased from 1.14 ft/sec at last assessment    Status Not Met  PT SHORT TERM GOAL #4   Title Patient will be independent with initial HEP for strengthening/balance    Baseline 07/06/20: met with current program    Status Achieved             PT Long Term Goals - 08/02/20 [place] #1   Title Patient will be independent with final strengthening/balance HEP and report completing walking program >/= 20 minutes daily(ALL LTG Due: 08/02/20)    Baseline reports independence; completed walking program only approx 10 minutes per day    Time 8    Period Weeks    Status Partially Met      PT LONG TERM GOAL #2   Title patient will improve gait speed to >/= 1.4 ft/sec to demonstrate improved community mobility    Baseline 1.14 ft/sec; 1.00 ft/sec    Time 8    Period Weeks    Status Not Met      PT LONG TERM GOAL #3   Title Patient will demo ability to ambulate >100 ft on outdoor/unlevel surfaces with supervision to demo improved community mobility    Baseline deferred due to weather constraints, able to ambulate 175 ft indoors supervision    Time 8    Period Weeks    Status Deferred      PT LONG TERM GOAL #4   Title Patient will improve TUG to </= 28 seconds to demonstrate improved mobility and reduced fall risk    Baseline 37.53 secs; 30.15 secs    Time 8    Period Weeks    Status Not Met      PT LONG TERM GOAL #5   Title Patient will improve Berg Balance to >/= 45/56 to demonstrate reduced risk for falls    Baseline 36/56; 44/56    Time 8    Period Weeks    Status Not Met           Updated Short Term Goals:   PT Short Term Goals - 08/02/20 1354      PT SHORT TERM GOAL #1   Title Patient will initiate Aquatic Therapy services (All STGS Due: 08/30/20)    Baseline have not yet participated in aquatic therapy services     Time 4    Period Weeks    Status New    Target Date 08/30/20           Updated Long Term Goals:     PT Long Term Goals - 08/02/20 1358      PT LONG TERM GOAL #1   Title Patient will report completing walking program >/= 20 minutes daily (ALL LTG Due: 08/02/20)    Baseline completing walking program only approx 10 minutes per day    Time 8    Period Weeks    Status Revised      PT LONG TERM GOAL #2   Title Patient will improve 5x sit <> stand to </= 18 secs with BUE support    Baseline 20.31 secs    Time 8    Period Weeks    Status Revised      PT LONG TERM GOAL #3   Title Patient will demo ability to ambulate >/= 50ft with improved upright posture and demonstrate scanning environment with ambulation    Baseline ambulation with increased flexed posture and decreased scanning environment    Time 8    Period Weeks    Status Revised  PT LONG TERM GOAL #4   Title Patient will demo ability to stand with narrow BOS in pool for >/= 30 seconds (approx 4 foot depth) to demonstrate improved balance    Baseline TBA    Time 8    Period Weeks    Status Revised      PT LONG TERM GOAL #5   Title --    Baseline --    Time --    Period --    Status --               Plan - 08/02/20 1351    Clinical Impression Statement Today's skilled PT session included assessment of patient's progress toward all LTG. Patient able to partially meet LTG #1. Unable to meet all other LTG, but did demonstrating progress toward all goals just not to goal level. Patient improved Berg Balance to 44/56 demonstrating improved balance and reduced fall risk. Patient ambulates at 1.00 ft/sec indicative of continued household ambulator. Patient still at high fall risk with TUG, 5x sit <> stand and Berg Balance scores. Will continue to benefit from skilled PT services to progress toward all LTGs.    Personal Factors and Comorbidities Comorbidity 3+    Comorbidities HTN, alcohol abuse (ongoing use),  HLD, GERD, PUD, tobacco use    Examination-Activity Limitations Bed Mobility;Dressing;Locomotion Level;Stairs;Stand;Toileting;Transfers;Bathing    Examination-Participation Restrictions Driving;Interpersonal Relationship;Yard Work;Community Activity    Stability/Clinical Decision Making Evolving/Moderate complexity    Rehab Potential Good    PT Frequency 1x / week    PT Duration 8 weeks    PT Treatment/Interventions ADLs/Self Care Home Management;Electrical Stimulation;DME Instruction;Moist Heat;Cryotherapy;Gait training;Stair training;Functional mobility training;Therapeutic activities;Therapeutic exercise;Balance training;Neuromuscular re-education;Patient/family education;Orthotic Fit/Training;Wheelchair mobility training;Passive range of motion;Aquatic Therapy;Vestibular;Manual techniques;Dry needling;Joint Manipulations;Spinal Manipulations    PT Next Visit Plan Begin Aquatics. Work on standing balance working toward narrow BOS. standing weight shift activities    Consulted and Agree with Plan of Care Patient;Family member/caregiver    Family Member Consulted Wife           Patient will benefit from skilled therapeutic intervention in order to improve the following deficits and impairments:  Abnormal gait,Decreased coordination,Difficulty walking,Impaired tone,Decreased safety awareness,Decreased endurance,Decreased activity tolerance,Pain,Decreased balance,Decreased knowledge of use of DME,Postural dysfunction,Decreased strength,Decreased mobility  Visit Diagnosis: Other abnormalities of gait and mobility  Difficulty in walking, not elsewhere classified  Muscle weakness (generalized)  Unsteadiness on feet     Problem List Patient Active Problem List   Diagnosis Date Noted  . H/O ETOH abuse 04/28/2020  . History of tobacco abuse 01/07/2020  . Hemiparesis affecting left side as late effect of stroke (HCC) 01/07/2020  . Abnormality of gait 09/24/2019  . AKI (acute kidney  injury) (HCC)   . Fever   . Sepsis without acute organ dysfunction (HCC)   . Elevated BUN   . Blood pressure increase diastolic   . Labile blood pressure   . Leukopenia   . Benign essential HTN   . Hypoalbuminemia   . Cerebrovascular accident (CVA) of right basal ganglia (HCC) 07/07/2019  . Acute ischemic stroke (HCC)   . Dyslipidemia   . Dysphagia, post-stroke   . Polycythemia   . Stroke (HCC) 06/30/2019  . Prediabetes 12/23/2016  . Elevated LFTs 12/23/2016  . Pure hypercholesterolemia 12/21/2016  . Alcoholism (HCC) 12/21/2016  . Eczema 09/18/2013  . Essential hypertension 09/18/2013  . Gastroesophageal reflux disease without esophagitis 09/18/2013  . Tobacco abuse 09/18/2013    Tempie Donning, PT, DPT 08/02/2020, 1:53 PM  [facility] [address] [place], [name], [address] Phone: [phone]   Fax:  [phone]  Name: Russell Russell MRN: [id number] Date of Birth: Jan 01, 1951

## 2020-08-02 NOTE — Therapy (Signed)
[facility] [address] [place], [name], [address] Phone: [phone]   Fax:  [phone]  Occupational Therapy Treatment  Patient Details  Name: Russell Russell MRN: [id number] Date of Birth: 21-Jan-1951 Referring Provider (OT): Delice Lesch   Encounter Date: 08/02/2020   OT End of Session - 08/02/20 1406    Visit Number 8    Number of Visits 9    Date for OT Re-Evaluation 08/21/20    Authorization Type [employer] - need auth    OT Start Time 1315    OT Stop Time 1358    OT Time Calculation (min) 43 min    Activity Tolerance Patient tolerated treatment well    Behavior During Therapy [facility] for tasks assessed/performed           Past Medical History:  Diagnosis Date  . Alcohol abuse    drinks 1 gallon of Gin every weekend, nothing during the week x >15 yrs  . Elevated transaminase level    +elev bili: abd u/s 06/2014 showed stable small hepatic hemangiomas, o/w normal.  . Essential hypertension   . GERD (gastroesophageal reflux disease)   . Hyperlipidemia 03/2014   Atorv started-chol improved  . Impaired fasting glucose 03/2014  . Nephrolithiasis   . PUD (peptic ulcer disease)   . Stroke ([place])   . Tobacco dependence    chantix: "psych effects"    Past Surgical History:  Procedure Laterality Date  . COLONOSCOPY  2005   Recall 10 yrs ([place])  . LOOP RECORDER INSERTION N/A 07/03/2019   Procedure: LOOP RECORDER INSERTION;  Surgeon: Thompson Grayer, MD;  Location: [facility];  Service: Cardiovascular;  Laterality: N/A;  . removal of kidney stones  2006   cystoscopic--removed from ureter.  No prob since.    There were no vitals filed for this visit.   Subjective Assessment - 08/02/20 1321    Subjective  Getting ready to start aquatic therapy next week    Patient is accompanied by: Family member    Pertinent History HTN, ETOH use, smoker, loop recorder    Currently in Pain? No/denies    Pain  Score 0-No pain                        OT Treatments/Exercises (OP) - 08/02/20 1404      Neurological Re-education Exercises   Other Exercises 1 Neuromuscular reeducation to address distal LUE control.  Encouraged use of left forearm on table to improve accuracy with grip, pinch, placement of hand to target.  With increased time beginning in hand manipulation skills emerging.  Grooved pegboard to address in hand manip skills, and removing three pegs at a time maintaining in ulnar side of hand without dropping.    Hand Gripper with Large Beads Hand gripper on lightest setting - 20 blocks x 2 with left hand.  Patient wearing glasses today, and better able to approach blocks.                    OT Short Term Goals - 08/02/20 1407      OT SHORT TERM GOAL #1   Title Patient will compelte a home exercise program designed to improve functional use of LUE due 5/13    Time 4    Period Weeks    Status Achieved      OT SHORT TERM GOAL #2   Title Patient will report decrease in pain with passive  range of motion beyond 80 degrees of shoulder flexion    Status Achieved      OT SHORT TERM GOAL #3   Title Patient will demonstrate 3 lb increase in grip strength in LUE    Baseline 20    Time 4    Period Weeks    Status Achieved             OT Long Term Goals - 08/02/20 1408      OT LONG TERM GOAL #1   Title Patient will complete an updated HEP to address LUE functioning DUE 08/21/20    Time 8    Period Weeks    Status Achieved      OT LONG TERM GOAL #2   Title Patient will transfer into shower with supervision    Baseline min assist    Time 8    Period Weeks    Status On-going      OT LONG TERM GOAL #3   Title Patient will demonstrate 10 lb increase in grip strength in LUE    Baseline 20    Time 8    Period Weeks    Status Achieved      OT LONG TERM GOAL #4   Title Patient will demonstrate ability to manipulate small items within left hand with  increased time, e.g. change    Time 8    Period Weeks    Status On-going      OT LONG TERM GOAL #5   Title Patient will demonstrate 120 degrees of active assisted shoulder flexion without report of increased pain    Time 8    Period Weeks    Status Achieved      OT LONG TERM GOAL #6   Title xxx      OT LONG TERM GOAL #7   Title xxx      OT LONG TERM GOAL #8   Title xxx                 Plan - 08/02/20 1407    Clinical Impression Statement Patient approaching discharge and has met most goals thus far.    OT Occupational Profile and History Detailed Assessment- Review of Records and additional review of physical, cognitive, psychosocial history related to current functional performance    Occupational performance deficits (Please refer to evaluation for details): ADL's;IADL's;Rest and Sleep;Leisure    Body Structure / Function / Physical Skills ADL;Coordination;Endurance;GMC;UE functional use;Sensation;Decreased knowledge of precautions;Balance;Body mechanics;Decreased knowledge of use of DME;Flexibility;IADL;Pain;Vision;Cardiopulmonary status limiting activity;Dexterity;FMC;Proprioception;Strength;Tone;ROM;Muscle spasms;Mobility    Cognitive Skills Attention;Energy/Drive;Learn;Sequencing;Safety Awareness;Problem Solve;Perception    Psychosocial Skills Habits;Routines and Behaviors    Rehab Potential Good    Clinical Decision Making Limited treatment options, no task modification necessary    Comorbidities Affecting Occupational Performance: May have comorbidities impacting occupational performance    Modification or Assistance to Complete Evaluation  No modification of tasks or assist necessary to complete eval    OT Frequency 1x / week    OT Duration 8 weeks    OT Treatment/Interventions Self-care/ADL training;Electrical Stimulation;Therapeutic exercise;Visual/perceptual remediation/compensation;Patient/family education;Splinting;Neuromuscular education;Moist Heat;Aquatic  Therapy;Balance training;Therapeutic activities;Functional Mobility Training;Fluidtherapy;DME and/or AE instruction;Manual Therapy;Cognitive remediation/compensation;Passive range of motion;Contrast Bath;Ultrasound;Cryotherapy    Plan review final goals and discharge    OT Home Exercise Plan coordination HEP, putty red - grip and pinch - from prior episode of care    Consulted and Agree with Plan of Care Patient;Family member/caregiver    Family Member Consulted Wife, [name]  Patient will benefit from skilled therapeutic intervention in order to improve the following deficits and impairments:   Body Structure / Function / Physical Skills: ADL,Coordination,Endurance,GMC,UE functional use,Sensation,Decreased knowledge of precautions,Balance,Body mechanics,Decreased knowledge of use of DME,Flexibility,IADL,Pain,Vision,Cardiopulmonary status limiting activity,Dexterity,FMC,Proprioception,Strength,Tone,ROM,Muscle spasms,Mobility Cognitive Skills: Attention,Energy/Drive,Learn,Sequencing,Safety Awareness,Problem Solve,Perception Psychosocial Skills: Habits,Routines and Behaviors   Visit Diagnosis: Hemiplegia and hemiparesis following cerebral infarction affecting left dominant side (HCC)  Attention and concentration deficit  Muscle weakness (generalized)  Unsteadiness on feet  Other lack of coordination  Apraxia    Problem List Patient Active Problem List   Diagnosis Date Noted  . H/O ETOH abuse 04/28/2020  . History of tobacco abuse 01/07/2020  . Hemiparesis affecting left side as late effect of stroke ([place]) 01/07/2020  . Abnormality of gait 09/24/2019  . AKI (acute kidney injury) ([place])   . Fever   . Sepsis without acute organ dysfunction ([place])   . Elevated BUN   . Blood pressure increase diastolic   . Labile blood pressure   . Leukopenia   . Benign essential HTN   . Hypoalbuminemia   . Cerebrovascular accident (CVA) of right basal ganglia ([place]) 07/07/2019  . Acute  ischemic stroke ([place])   . Dyslipidemia   . Dysphagia, post-stroke   . Polycythemia   . Stroke ([place]) 06/30/2019  . Prediabetes 12/23/2016  . Elevated LFTs 12/23/2016  . Pure hypercholesterolemia 12/21/2016  . Alcoholism ([place]) 12/21/2016  . Eczema 09/18/2013  . Essential hypertension 09/18/2013  . Gastroesophageal reflux disease without esophagitis 09/18/2013  . Tobacco abuse 09/18/2013    Mariah Milling 08/02/2020, 2:09 PM  [facility] [address] [place], [name], [address] Phone: [phone]   Fax:  [phone]  Name: Russell Russell MRN: [id number] Date of Birth: Feb 12, 1951

## 2020-08-03 DIAGNOSIS — R030 Elevated blood-pressure reading, without diagnosis of hypertension: Secondary | ICD-10-CM | POA: Diagnosis not present

## 2020-08-03 DIAGNOSIS — Z720 Tobacco use: Secondary | ICD-10-CM | POA: Diagnosis not present

## 2020-08-03 DIAGNOSIS — A419 Sepsis, unspecified organism: Secondary | ICD-10-CM | POA: Diagnosis not present

## 2020-08-03 DIAGNOSIS — F1010 Alcohol abuse, uncomplicated: Secondary | ICD-10-CM | POA: Diagnosis not present

## 2020-08-03 DIAGNOSIS — I639 Cerebral infarction, unspecified: Secondary | ICD-10-CM | POA: Diagnosis not present

## 2020-08-03 DIAGNOSIS — R0989 Other specified symptoms and signs involving the circulatory and respiratory systems: Secondary | ICD-10-CM | POA: Diagnosis not present

## 2020-08-03 DIAGNOSIS — D72819 Decreased white blood cell count, unspecified: Secondary | ICD-10-CM | POA: Diagnosis not present

## 2020-08-03 DIAGNOSIS — E8809 Other disorders of plasma-protein metabolism, not elsewhere classified: Secondary | ICD-10-CM | POA: Diagnosis not present

## 2020-08-03 DIAGNOSIS — R509 Fever, unspecified: Secondary | ICD-10-CM | POA: Diagnosis not present

## 2020-08-09 ENCOUNTER — Encounter: Payer: Self-pay | Admitting: Physical Therapy

## 2020-08-09 ENCOUNTER — Other Ambulatory Visit: Payer: Self-pay

## 2020-08-09 ENCOUNTER — Ambulatory Visit: Payer: Medicare HMO | Admitting: Physical Therapy

## 2020-08-09 DIAGNOSIS — R2689 Other abnormalities of gait and mobility: Secondary | ICD-10-CM

## 2020-08-09 DIAGNOSIS — R262 Difficulty in walking, not elsewhere classified: Secondary | ICD-10-CM

## 2020-08-09 DIAGNOSIS — M6281 Muscle weakness (generalized): Secondary | ICD-10-CM | POA: Diagnosis not present

## 2020-08-09 DIAGNOSIS — R208 Other disturbances of skin sensation: Secondary | ICD-10-CM | POA: Diagnosis not present

## 2020-08-09 DIAGNOSIS — I69352 Hemiplegia and hemiparesis following cerebral infarction affecting left dominant side: Secondary | ICD-10-CM | POA: Diagnosis not present

## 2020-08-09 DIAGNOSIS — R278 Other lack of coordination: Secondary | ICD-10-CM | POA: Diagnosis not present

## 2020-08-09 DIAGNOSIS — R482 Apraxia: Secondary | ICD-10-CM | POA: Diagnosis not present

## 2020-08-09 DIAGNOSIS — R2681 Unsteadiness on feet: Secondary | ICD-10-CM

## 2020-08-09 DIAGNOSIS — R41840 Attention and concentration deficit: Secondary | ICD-10-CM | POA: Diagnosis not present

## 2020-08-09 NOTE — Therapy (Addendum)
[facility] [address] [place], [name], [address] Phone: [phone]   Fax:  [phone]  Physical Therapy Treatment  Patient Details  Name: Russell Russell MRN: [id number] Date of Birth: [birth year] Referring Provider (PT): Jamse Arn, MD   Encounter Date: 08/09/2020   PT End of Session - 08/09/20 1410    Visit Number 9    Number of Visits 16    Date for PT Re-Evaluation 10/31/20   POC for 8 weeks, Cert for 90 days   Authorization Type [employer] (medicare guidelines, 10th Visit PN)    Authorization Time Period awaiting new authorization    Progress Note Due on Visit 10    PT Start Time 1145    PT Stop Time 1230    PT Time Calculation (min) 45 min    Equipment Utilized During Treatment Other (comment)   water walker, pool noodle, buoyancy cuff   Activity Tolerance Patient tolerated treatment well    Behavior During Therapy [facility] for tasks assessed/performed           Past Medical History:  Diagnosis Date  . Alcohol abuse    drinks 1 gallon of Gin every weekend, nothing during the week x >15 yrs  . Elevated transaminase level    +elev bili: abd u/s 06/2014 showed stable small hepatic hemangiomas, o/w normal.  . Essential hypertension   . GERD (gastroesophageal reflux disease)   . Hyperlipidemia 03/2014   Atorv started-chol improved  . Impaired fasting glucose 03/2014  . Nephrolithiasis   . PUD (peptic ulcer disease)   . Stroke ([place])   . Tobacco dependence    chantix: "psych effects"    Past Surgical History:  Procedure Laterality Date  . COLONOSCOPY  2005   Recall 10 yrs ([place])  . LOOP RECORDER INSERTION N/A 07/03/2019   Procedure: LOOP RECORDER INSERTION;  Surgeon: Thompson Grayer, MD;  Location: [facility];  Service: Cardiovascular;  Laterality: N/A;  . removal of kidney stones  2006   cystoscopic--removed from ureter.  No prob since.    There were no vitals filed for this  visit.   Subjective Assessment - 08/09/20 1409    Subjective Denies any falls or changes.    Patient is accompained by: Family member   spouse   Pertinent History PMH: HTN, alcohol abuse (ongoing use), HLD, GERD, PUD, tobacco use    Limitations Standing;Walking;House hold activities    Patient Stated Goals improve strength of L Leg; Be able to walk without dragging LLE    Currently in Pain? No/denies           Aqu[facility] therapy at [facility] Patient seen for aquatic therapy today. Treatment took place in water 3.6-43feet deep depending upon activity. Pt entered and exited the the pool steps with bil rails and min-guard assist.  Performed bil runners stretch at pool edge x 30 sec  Pt performed trunk strengthening/core stabilization exercises - seated on bench in pool - pt held pool noodle - performed shoulder protraction/retraction20reps; trunk rotation to Rt and Lt sides holding pool noodlewith elbows extended 20reps to each side; Pt needing to lean against pool wall for support of trunk and/or use UE's to balance.  Pt performed bil LE strengthening without then with ankle buoyancy cuff- seated on bench in pool - LAQ's2sets 10reps with pt using bil. UE's to maintain balance; Marching/hip flexion 2 sets x 10 reps again using UE's to balance.  Pt challenged with hip extension on L  with ankle cuff and being able to extend hip due to resistance.  Pt gait trained in3.6-4.0 ft and water walker  Pt gait trained 18' x4reps x 2 sets across pool with min-CGA with water walker.Pt with decreased advancement of LLE and decreased cadence due to decreased strength and balance.   Pt requires buoyancy of water for clearance of LLE with gait. Buoyancy resisted exercises beneficial for trunk and LE strengthening. Buoyancy needed for support and for safety for reduced fall risk with activities in standing and with gait training. Viscosity of water needed for resistance for strengthening  of trunk musc. for improved core stabilization and for LE strengthening.      PT Short Term Goals - 08/02/20 1354      PT SHORT TERM GOAL #1   Title Patient will initiate Aquatic Therapy services (All STGS Due: 08/30/20)    Baseline have not yet participated in aquatic therapy services    Time 4    Period Weeks    Status New    Target Date 08/30/20             PT Long Term Goals - 08/02/20 1358      PT LONG TERM GOAL #1   Title Patient will report completing walking program >/= 20 minutes daily (ALL LTG Due: 08/02/20)    Baseline completing walking program only approx 10 minutes per day    Time 8    Period Weeks    Status Revised      PT LONG TERM GOAL #2   Title Patient will improve 5x sit <> stand to </= 18 secs with BUE support    Baseline 20.31 secs    Time 8    Period Weeks    Status Revised      PT LONG TERM GOAL #3   Title Patient will demo ability to ambulate >/= 63ft with improved upright posture and demonstrate scanning environment with ambulation    Baseline ambulation with increased flexed posture and decreased scanning environment    Time 8    Period Weeks    Status Revised      PT LONG TERM GOAL #4   Title Patient will demo ability to stand with narrow BOS in pool for >/= 30 seconds (approx 4 foot depth) to demonstrate improved balance    Baseline TBA    Time 8    Period Weeks    Status Revised      PT LONG TERM GOAL #5   Title --    Baseline --    Time --    Period --    Status --                 Plan - 08/09/20 1410    Clinical Impression Statement Pt appropriately challenged with balance, gait and exercises today.    Personal Factors and Comorbidities Comorbidity 3+    Comorbidities HTN, alcohol abuse (ongoing use), HLD, GERD, PUD, tobacco use    Examination-Activity Limitations Bed Mobility;Dressing;Locomotion Level;Stairs;Stand;Toileting;Transfers;Bathing    Examination-Participation Restrictions Driving;Interpersonal  Relationship;Yard Work;Community Activity    Stability/Clinical Decision Making Evolving/Moderate complexity    Rehab Potential Good    PT Frequency 1x / week    PT Duration 8 weeks    PT Treatment/Interventions ADLs/Self Care Home Management;Electrical Stimulation;DME Instruction;Moist Heat;Cryotherapy;Gait training;Stair training;Functional mobility training;Therapeutic activities;Therapeutic exercise;Balance training;Neuromuscular re-education;Patient/family education;Orthotic Fit/Training;Wheelchair mobility training;Passive range of motion;Aquatic Therapy;Vestibular;Manual techniques;Dry needling;Joint Manipulations;Spinal Manipulations    PT Next Visit Plan Begin Aquatics. Work on standing  balance working toward narrow BOS. standing weight shift activities    Consulted and Agree with Plan of Care Patient;Family member/caregiver    Family Member Consulted Wife           Patient will benefit from skilled therapeutic intervention in order to improve the following deficits and impairments:  Abnormal gait,Decreased coordination,Difficulty walking,Impaired tone,Decreased safety awareness,Decreased endurance,Decreased activity tolerance,Pain,Decreased balance,Decreased knowledge of use of DME,Postural dysfunction,Decreased strength,Decreased mobility  Visit Diagnosis: Other abnormalities of gait and mobility  Difficulty in walking, not elsewhere classified  Unsteadiness on feet  Muscle weakness (generalized)     Problem List Patient Active Problem List   Diagnosis Date Noted  . H/O ETOH abuse 04/28/2020  . History of tobacco abuse 01/07/2020  . Hemiparesis affecting left side as late effect of stroke ([place]) 01/07/2020  . Abnormality of gait 09/24/2019  . AKI (acute kidney injury) ([place])   . Fever   . Sepsis without acute organ dysfunction ([place])   . Elevated BUN   . Blood pressure increase diastolic   . Labile blood pressure   . Leukopenia   . Benign essential HTN   .  Hypoalbuminemia   . Cerebrovascular accident (CVA) of right basal ganglia ([place]) 07/07/2019  . Acute ischemic stroke ([place])   . Dyslipidemia   . Dysphagia, post-stroke   . Polycythemia   . Stroke ([place]) 06/30/2019  . Prediabetes 12/23/2016  . Elevated LFTs 12/23/2016  . Pure hypercholesterolemia 12/21/2016  . Alcoholism ([place]) 12/21/2016  . Eczema 09/18/2013  . Essential hypertension 09/18/2013  . Gastroesophageal reflux disease without esophagitis 09/18/2013  . Tobacco abuse 09/18/2013    Narda Bonds, PTA [place] 08/09/20 2:12 PM Phone: [phone] Fax: [facility] [address] [place], [name], [address] Phone: [phone]   Fax:  [phone]  Name: Russell Russell MRN: [id number] Date of Birth: 09-09-1950

## 2020-08-09 NOTE — Addendum Note (Signed)
Addended by: Baldomero Lamy B on: 08/09/2020 08:52 AM   Modules accepted: Orders

## 2020-08-11 ENCOUNTER — Encounter: Payer: Self-pay | Admitting: Occupational Therapy

## 2020-08-11 ENCOUNTER — Ambulatory Visit: Payer: Medicare HMO | Attending: Physical Medicine & Rehabilitation | Admitting: Occupational Therapy

## 2020-08-11 ENCOUNTER — Other Ambulatory Visit: Payer: Self-pay

## 2020-08-11 DIAGNOSIS — R262 Difficulty in walking, not elsewhere classified: Secondary | ICD-10-CM | POA: Insufficient documentation

## 2020-08-11 DIAGNOSIS — R278 Other lack of coordination: Secondary | ICD-10-CM | POA: Insufficient documentation

## 2020-08-11 DIAGNOSIS — R41840 Attention and concentration deficit: Secondary | ICD-10-CM | POA: Insufficient documentation

## 2020-08-11 DIAGNOSIS — R2681 Unsteadiness on feet: Secondary | ICD-10-CM | POA: Diagnosis not present

## 2020-08-11 DIAGNOSIS — R2689 Other abnormalities of gait and mobility: Secondary | ICD-10-CM | POA: Diagnosis not present

## 2020-08-11 DIAGNOSIS — M6281 Muscle weakness (generalized): Secondary | ICD-10-CM | POA: Insufficient documentation

## 2020-08-11 DIAGNOSIS — R482 Apraxia: Secondary | ICD-10-CM | POA: Diagnosis not present

## 2020-08-11 DIAGNOSIS — I69352 Hemiplegia and hemiparesis following cerebral infarction affecting left dominant side: Secondary | ICD-10-CM | POA: Diagnosis not present

## 2020-08-11 NOTE — Therapy (Signed)
[facility] [address] [place], [name], [address] Phone: [phone]   Fax:  [phone]  Occupational Therapy Treatment and Discharge Summary  Patient Details  Name: Russell Russell MRN: [id number] Date of Birth: 10-14-1950 Referring Provider (OT): Delice Lesch   Encounter Date: 08/11/2020   OT End of Session - 08/11/20 3335    Visit Number 9    Number of Visits 9    Date for OT Re-Evaluation 08/21/20    Authorization Type [employer] - need auth    Authorization - Number of Visits 27    Progress Note Due on Visit 28    OT Start Time 1400    OT Stop Time 1440    OT Time Calculation (min) 40 min    Activity Tolerance Patient tolerated treatment well    Behavior During Therapy [facility] for tasks assessed/performed           Past Medical History:  Diagnosis Date  . Alcohol abuse    drinks 1 gallon of Gin every weekend, nothing during the week x >15 yrs  . Elevated transaminase level    +elev bili: abd u/s 06/2014 showed stable small hepatic hemangiomas, o/w normal.  . Essential hypertension   . GERD (gastroesophageal reflux disease)   . Hyperlipidemia 03/2014   Atorv started-chol improved  . Impaired fasting glucose 03/2014  . Nephrolithiasis   . PUD (peptic ulcer disease)   . Stroke ([place])   . Tobacco dependence    chantix: "psych effects"    Past Surgical History:  Procedure Laterality Date  . COLONOSCOPY  2005   Recall 10 yrs ([place])  . LOOP RECORDER INSERTION N/A 07/03/2019   Procedure: LOOP RECORDER INSERTION;  Surgeon: Thompson Grayer, MD;  Location: [facility];  Service: Cardiovascular;  Laterality: N/A;  . removal of kidney stones  2006   cystoscopic--removed from ureter.  No prob since.    There were no vitals filed for this visit.   Subjective Assessment - 08/11/20 1415    Subjective  I use it to push up.    Patient is accompanied by: Family member    Pertinent History HTN, ETOH  use, smoker, loop recorder    Currently in Pain? No/denies    Pain Score 0-No pain                        OT Treatments/Exercises (OP) - 08/11/20 0001      ADLs   LB Dressing Patient able to use BUE to tie shoes!    Bathing Verbally reviewed shower transfer and reminded patient and wife that the strategy that was safest in clinic was video'd on her phone.    ADL Comments Reviewed reamining goals and plan for discharge- and recommendation to take a break from therapy for 3-6 months.      Neurological Re-education Exercises   Other Exercises 1 Neuromuscular reeducation to address in hand manipulation with left hand.  Patient continues with significant sensory loss, and apraxia.  Patient did best with hand fisted then using index and thumb to manage small items.                    OT Short Term Goals - 08/11/20 1419      OT SHORT TERM GOAL #1   Title Patient will compelte a home exercise program designed to improve functional use of LUE due 5/13    Time 4  Period Weeks    Target Date 07/22/20      OT SHORT TERM GOAL #2   Title Patient will report decrease in pain with passive range of motion beyond 80 degrees of shoulder flexion    Period Weeks    Status Achieved      OT SHORT TERM GOAL #3   Title Patient will demonstrate 3 lb increase in grip strength in LUE    Baseline 20    Time 4    Period Weeks    Status Achieved             OT Long Term Goals - 08/11/20 1420      OT LONG TERM GOAL #1   Title Patient will complete an updated HEP to address LUE functioning DUE 08/21/20    Time 8    Period Weeks    Status Achieved      OT LONG TERM GOAL #2   Title Patient will transfer into shower with supervision    Baseline min assist    Time 8    Period Weeks    Status Achieved   can shower with modified independence at extended stay hotel - not yet in home     OT [place] #3   Title Patient will demonstrate 10 lb increase in grip strength  in LUE    Baseline 20    Time 8    Period Weeks    Status Achieved      OT LONG TERM GOAL #4   Title Patient will demonstrate ability to manipulate small items within left hand with increased time, e.g. change    Time 8    Period Weeks    Status Achieved      OT LONG TERM GOAL #5   Title Patient will demonstrate 120 degrees of active assisted shoulder flexion without report of increased pain    Time 8    Period Weeks    Status Achieved      OT LONG TERM GOAL #6   Title xxx      OT LONG TERM GOAL #7   Title xxx      OT LONG TERM GOAL #8   Title xxx                 Plan - 08/11/20 1454    Clinical Impression Statement Patient has met all OT goals and is agreeable to discharge.  Patient will continue with PT for aquatic therapy.    OT Occupational Profile and History Detailed Assessment- Review of Records and additional review of physical, cognitive, psychosocial history related to current functional performance    Occupational performance deficits (Please refer to evaluation for details): ADL's;IADL's;Rest and Sleep;Leisure    Body Structure / Function / Physical Skills ADL;Coordination;Endurance;GMC;UE functional use;Sensation;Decreased knowledge of precautions;Balance;Body mechanics;Decreased knowledge of use of DME;Flexibility;IADL;Pain;Vision;Cardiopulmonary status limiting activity;Dexterity;FMC;Proprioception;Strength;Tone;ROM;Muscle spasms;Mobility    Cognitive Skills Attention;Energy/Drive;Learn;Sequencing;Safety Awareness;Problem Solve;Perception    Psychosocial Skills Habits;Routines and Behaviors    Rehab Potential Good    Clinical Decision Making Limited treatment options, no task modification necessary    Comorbidities Affecting Occupational Performance: May have comorbidities impacting occupational performance    Modification or Assistance to Complete Evaluation  No modification of tasks or assist necessary to complete eval    OT Frequency 1x / week    OT  Duration 8 weeks    OT Treatment/Interventions Self-care/ADL training;Electrical Stimulation;Therapeutic exercise;Visual/perceptual remediation/compensation;Patient/family education;Splinting;Neuromuscular education;Moist Heat;Aquatic Therapy;Balance training;Therapeutic activities;Functional Mobility Training;Fluidtherapy;DME and/or AE instruction;Manual Therapy;Cognitive  remediation/compensation;Passive range of motion;Contrast Bath;Ultrasound;Cryotherapy    Plan review final goals and discharge    OT Home Exercise Plan coordination HEP, putty red - grip and pinch - from prior episode of care    Consulted and Agree with Plan of Care Patient;Family member/caregiver    Family Member Consulted Wife, [name]           Patient will benefit from skilled therapeutic intervention in order to improve the following deficits and impairments:   Body Structure / Function / Physical Skills: ADL,Coordination,Endurance,GMC,UE functional use,Sensation,Decreased knowledge of precautions,Balance,Body mechanics,Decreased knowledge of use of DME,Flexibility,IADL,Pain,Vision,Cardiopulmonary status limiting activity,Dexterity,FMC,Proprioception,Strength,Tone,ROM,Muscle spasms,Mobility Cognitive Skills: Attention,Energy/Drive,Learn,Sequencing,Safety Awareness,Problem Solve,Perception Psychosocial Skills: Habits,Routines and Behaviors   Visit Diagnosis: Hemiplegia and hemiparesis following cerebral infarction affecting left dominant side (HCC)  Attention and concentration deficit  Apraxia  Other lack of coordination  Muscle weakness (generalized)  Unsteadiness on feet    Problem List Patient Active Problem List   Diagnosis Date Noted  . H/O ETOH abuse 04/28/2020  . History of tobacco abuse 01/07/2020  . Hemiparesis affecting left side as late effect of stroke ([place]) 01/07/2020  . Abnormality of gait 09/24/2019  . AKI (acute kidney injury) ([place])   . Fever   . Sepsis without acute organ dysfunction  ([place])   . Elevated BUN   . Blood pressure increase diastolic   . Labile blood pressure   . Leukopenia   . Benign essential HTN   . Hypoalbuminemia   . Cerebrovascular accident (CVA) of right basal ganglia ([place]) 07/07/2019  . Acute ischemic stroke ([place])   . Dyslipidemia   . Dysphagia, post-stroke   . Polycythemia   . Stroke ([place]) 06/30/2019  . Prediabetes 12/23/2016  . Elevated LFTs 12/23/2016  . Pure hypercholesterolemia 12/21/2016  . Alcoholism ([place]) 12/21/2016  . Eczema 09/18/2013  . Essential hypertension 09/18/2013  . Gastroesophageal reflux disease without esophagitis 09/18/2013  . Tobacco abuse 09/18/2013   OCCUPATIONAL THERAPY DISCHARGE SUMMARY  Visits from Start of Care: 9  Current functional level related to goals / functional outcomes: Improved pain in left shoulder and functional use of LUE   Remaining deficits: Hemiplegia, apraxia, dense sensory loss, decreased balance   Education / Equipment: Safe shower transfers, HEP LUE, Functional use of LUE Plan: Patient agrees to discharge.  Patient goals were met. Patient is being discharged due to meeting the stated rehab goals.  ?????     Mariah Milling, OTR/L 08/11/2020, 2:56 PM  [facility] [address] [place], [name], [address] Phone: [phone]   Fax:  [phone]  Name: Russell Russell MRN: [id number] Date of Birth: 1950-06-25

## 2020-08-14 LAB — CUP PACEART REMOTE DEVICE CHECK
Date Time Interrogation Session: 20220602230727
Implantable Pulse Generator Implant Date: 20210423

## 2020-08-15 ENCOUNTER — Ambulatory Visit (INDEPENDENT_AMBULATORY_CARE_PROVIDER_SITE_OTHER): Payer: Medicare HMO

## 2020-08-15 DIAGNOSIS — I6381 Other cerebral infarction due to occlusion or stenosis of small artery: Secondary | ICD-10-CM

## 2020-08-16 ENCOUNTER — Encounter: Payer: Self-pay | Admitting: Physical Therapy

## 2020-08-16 ENCOUNTER — Other Ambulatory Visit: Payer: Self-pay

## 2020-08-16 ENCOUNTER — Ambulatory Visit: Payer: Medicare HMO | Admitting: Physical Therapy

## 2020-08-16 DIAGNOSIS — M6281 Muscle weakness (generalized): Secondary | ICD-10-CM | POA: Diagnosis not present

## 2020-08-16 DIAGNOSIS — R41840 Attention and concentration deficit: Secondary | ICD-10-CM | POA: Diagnosis not present

## 2020-08-16 DIAGNOSIS — I69352 Hemiplegia and hemiparesis following cerebral infarction affecting left dominant side: Secondary | ICD-10-CM | POA: Diagnosis not present

## 2020-08-16 DIAGNOSIS — R2689 Other abnormalities of gait and mobility: Secondary | ICD-10-CM

## 2020-08-16 DIAGNOSIS — R262 Difficulty in walking, not elsewhere classified: Secondary | ICD-10-CM

## 2020-08-16 DIAGNOSIS — R2681 Unsteadiness on feet: Secondary | ICD-10-CM

## 2020-08-16 DIAGNOSIS — R482 Apraxia: Secondary | ICD-10-CM | POA: Diagnosis not present

## 2020-08-16 DIAGNOSIS — R278 Other lack of coordination: Secondary | ICD-10-CM | POA: Diagnosis not present

## 2020-08-16 NOTE — Therapy (Addendum)
[facility] [address] [place], [name], [address] Phone: [phone]   Fax:  [phone]  Physical Therapy Treatment/Progress Note  Patient Details  Name: Russell Russell MRN: [id number] Date of Birth: 18-Mar-1950 Referring Provider (PT): Jamse Arn, MD  Physical Therapy Progress Note   Dates of Reporting Period: 06/07/2020 - 08/16/2020  See Note below for Objective Data and Assessment of Progress/Goals.  Thank you for the referral of this patient. Guillermina [place], PT, DPT   Encounter Date: 08/16/2020   PT End of Session - 08/16/20 1809     Visit Number 10    Number of Visits 16    Date for PT Re-Evaluation 10/31/20   POC for 8 weeks, Cert for 90 days   Authorization Type [employer] (medicare guidelines, 10th Visit PN)    Authorization Time Period awaiting new authorization    Progress Note Due on Visit 10    PT Start Time 0930    PT Stop Time 1015    PT Time Calculation (min) 45 min    Equipment Utilized During Treatment Other (comment)   floatation belt, pool noodle, buoyancy cuff   Activity Tolerance Patient tolerated treatment well    Behavior During Therapy WFL for tasks assessed/performed             Past Medical History:  Diagnosis Date   Alcohol abuse    drinks 1 gallon of Gin every weekend, nothing during the week x >15 yrs   Elevated transaminase level    +elev bili: abd u/s 06/2014 showed stable small hepatic hemangiomas, o/w normal.   Essential hypertension    GERD (gastroesophageal reflux disease)    Hyperlipidemia 03/2014   Atorv started-chol improved   Impaired fasting glucose 03/2014   Nephrolithiasis    PUD (peptic ulcer disease)    Stroke ([place])    Tobacco dependence    chantix: "psych effects"    Past Surgical History:  Procedure Laterality Date   COLONOSCOPY  2005   Recall 10 yrs ([place])   LOOP RECORDER INSERTION N/A 07/03/2019   Procedure: LOOP RECORDER  INSERTION;  Surgeon: Thompson Grayer, MD;  Location: [facility];  Service: Cardiovascular;  Laterality: N/A;   removal of kidney stones  2006   cystoscopic--removed from ureter.  No prob since.    There were no vitals filed for this visit.   Subjective Assessment - 08/16/20 1807     Subjective Reports enjoying last aquatic session and felt better after session.    Patient is accompained by: Family member   spouse   Pertinent History PMH: HTN, alcohol abuse (ongoing use), HLD, GERD, PUD, tobacco use    Limitations Standing;Walking;House hold activities    Patient Stated Goals improve strength of L Leg; Be able to walk without dragging LLE    Currently in Pain? No/denies             Aquatic therapy at [facility]  Patient seen for aquatic therapy today.  Treatment took place in water 3.6-4.2 feet deep depending upon activity.  Pt entered and exited the   the pool steps with bil rails and min-guard assist.   Performed bil runners stretch at pool edge x 30 sec   Pt performed trunk strengthening/core stabilization exercises - seated on bench in pool - pt held pool noodle - performed shoulder protraction/retraction 20 reps; trunk rotation to Rt and Lt sides holding pool noodle with elbows extended 20 reps to each side;  Pt  needing to lean against pool wall intermittently for support of trunk and/or use UE's to balance.    Pt performed bil LE strengthening with ankle buoyancy cuff- seated on bench in pool - LAQ's 2 sets 10 reps with pt using bil. UE's to maintain balance; Marching/hip flexion 2 sets x 10 reps again using UE's to balance.  Pt challenged with hip extension on L with ankle cuff and being able to extend hip due to resistance. Performed bil hip abd/add x 20 reps without cuffs.   Pt gait trained in 3.6-4.2 ft with pool noodle and floatation belt.  Pt with decreased advancement of LLE and decreased cadence due to decreased strength and balance. Required cues to increase L step  length.  Pt required min-min guard assist.     Pt requires buoyancy of water for clearance of LLE with gait.  Buoyancy resisted exercises beneficial for trunk and LE strengthening.  Buoyancy needed for support and for safety for reduced fall risk with activities in standing and with gait training.  Viscosity of water needed for resistance for strengthening of trunk musc. for improved core stabilization and for LE strengthening.     PT Short Term Goals - 08/02/20 1354       PT SHORT TERM GOAL #1   Title Patient will initiate Aquatic Therapy services (All STGS Due: 08/30/20)    Baseline have not yet participated in aquatic therapy services    Time 4    Period Weeks    Status New    Target Date 08/30/20               PT Long Term Goals - 08/02/20 1358       PT LONG TERM GOAL #1   Title Patient will report completing walking program >/= 20 minutes daily (ALL LTG Due: 08/02/20)    Baseline completing walking program only approx 10 minutes per day    Time 8    Period Weeks    Status Revised      PT LONG TERM GOAL #2   Title Patient will improve 5x sit <> stand to </= 18 secs with BUE support    Baseline 20.31 secs    Time 8    Period Weeks    Status Revised      PT LONG TERM GOAL #3   Title Patient will demo ability to ambulate >/= 31ft with improved upright posture and demonstrate scanning environment with ambulation    Baseline ambulation with increased flexed posture and decreased scanning environment    Time 8    Period Weeks    Status Revised      PT LONG TERM GOAL #4   Title Patient will demo ability to stand with narrow BOS in pool for >/= 30 seconds (approx 4 foot depth) to demonstrate improved balance    Baseline TBA    Time 8    Period Weeks    Status Revised      PT LONG TERM GOAL #5   Title --    Baseline --    Time --    Period --    Status --                   Plan - 08/16/20 1810     Clinical Impression Statement Skilled aquatic  session focused on gait, balance and strength. Pt with decreased step length on L with gait in pool and requires cues to increase step length.  Decreased balance and  requires UE support at all times.  Cont per poc.    Personal Factors and Comorbidities Comorbidity 3+    Comorbidities HTN, alcohol abuse (ongoing use), HLD, GERD, PUD, tobacco use    Examination-Activity Limitations Bed Mobility;Dressing;Locomotion Level;Stairs;Stand;Toileting;Transfers;Bathing    Examination-Participation Restrictions Driving;Interpersonal Relationship;Yard Work;Community Activity    Stability/Clinical Decision Making Evolving/Moderate complexity    Rehab Potential Good    PT Frequency 1x / week    PT Duration 8 weeks    PT Treatment/Interventions ADLs/Self Care Home Management;Electrical Stimulation;DME Instruction;Moist Heat;Cryotherapy;Gait training;Stair training;Functional mobility training;Therapeutic activities;Therapeutic exercise;Balance training;Neuromuscular re-education;Patient/family education;Orthotic Fit/Training;Wheelchair mobility training;Passive range of motion;Aquatic Therapy;Vestibular;Manual techniques;Dry needling;Joint Manipulations;Spinal Manipulations    PT Next Visit Plan Begin Aquatics. Work on standing balance working toward narrow BOS. standing weight shift activities    Consulted and Agree with Plan of Care Patient;Family member/caregiver    Family Member Consulted Wife             Patient will benefit from skilled therapeutic intervention in order to improve the following deficits and impairments:  Abnormal gait,Decreased coordination,Difficulty walking,Impaired tone,Decreased safety awareness,Decreased endurance,Decreased activity tolerance,Pain,Decreased balance,Decreased knowledge of use of DME,Postural dysfunction,Decreased strength,Decreased mobility  Visit Diagnosis: Other abnormalities of gait and mobility  Difficulty in walking, not elsewhere classified  Unsteadiness  on feet  Muscle weakness (generalized)     Problem List Patient Active Problem List   Diagnosis Date Noted   H/O ETOH abuse 04/28/2020   History of tobacco abuse 01/07/2020   Hemiparesis affecting left side as late effect of stroke ([place]) 01/07/2020   Abnormality of gait 09/24/2019   AKI (acute kidney injury) ([place])    Fever    Sepsis without acute organ dysfunction (HCC)    Elevated BUN    Blood pressure increase diastolic    Labile blood pressure    Leukopenia    Benign essential HTN    Hypoalbuminemia    Cerebrovascular accident (CVA) of right basal ganglia ([place]) 07/07/2019   Acute ischemic stroke ([place])    Dyslipidemia    Dysphagia, post-stroke    Polycythemia    Stroke ([place]) 06/30/2019   Prediabetes 12/23/2016   Elevated LFTs 12/23/2016   Pure hypercholesterolemia 12/21/2016   Alcoholism ([place]) 12/21/2016   Eczema 09/18/2013   Essential hypertension 09/18/2013   Gastroesophageal reflux disease without esophagitis 09/18/2013   Tobacco abuse 09/18/2013    Narda Bonds, PTA [facility] 08/16/20 9:07 PM Phone: [phone] Fax: [facility] [address] [place], [name], [address] Phone: [phone]   Fax:  [phone]  Name: Russell Russell MRN: [id number] Date of Birth: 15-Jul-1950

## 2020-08-23 ENCOUNTER — Ambulatory Visit: Payer: Medicare HMO | Admitting: Physical Therapy

## 2020-08-30 ENCOUNTER — Encounter: Payer: Self-pay | Admitting: Physical Therapy

## 2020-08-30 ENCOUNTER — Ambulatory Visit: Payer: Medicare HMO | Admitting: Physical Therapy

## 2020-08-30 ENCOUNTER — Other Ambulatory Visit: Payer: Self-pay

## 2020-08-30 DIAGNOSIS — R262 Difficulty in walking, not elsewhere classified: Secondary | ICD-10-CM

## 2020-08-30 DIAGNOSIS — R2681 Unsteadiness on feet: Secondary | ICD-10-CM | POA: Diagnosis not present

## 2020-08-30 DIAGNOSIS — R41840 Attention and concentration deficit: Secondary | ICD-10-CM | POA: Diagnosis not present

## 2020-08-30 DIAGNOSIS — R278 Other lack of coordination: Secondary | ICD-10-CM | POA: Diagnosis not present

## 2020-08-30 DIAGNOSIS — R2689 Other abnormalities of gait and mobility: Secondary | ICD-10-CM | POA: Diagnosis not present

## 2020-08-30 DIAGNOSIS — I69352 Hemiplegia and hemiparesis following cerebral infarction affecting left dominant side: Secondary | ICD-10-CM | POA: Diagnosis not present

## 2020-08-30 DIAGNOSIS — M6281 Muscle weakness (generalized): Secondary | ICD-10-CM | POA: Diagnosis not present

## 2020-08-30 DIAGNOSIS — R482 Apraxia: Secondary | ICD-10-CM | POA: Diagnosis not present

## 2020-08-30 NOTE — Therapy (Signed)
[facility] [address] [place], [name], [address] Phone: [phone]   Fax:  [phone]  Physical Therapy Treatment  Patient Details  Name: Russell Russell MRN: [id number] Date of Birth: Feb 22, 1951 Referring Provider (PT): Jamse Arn, MD   Encounter Date: 08/30/2020   PT End of Session - 08/30/20 1537     Visit Number 11    Number of Visits 16    Date for PT Re-Evaluation 10/31/20   POC for 8 weeks, Cert for 90 days   Authorization Type [employer] Medicare (medicare guidelines, 10th Visit PN)    Authorization Time Period awaiting new authorization    Progress Note Due on Visit 10    PT Start Time 0930    PT Stop Time 1015    PT Time Calculation (min) 45 min    Equipment Utilized During Treatment Other (comment)   floatation belt, pool noodle, buoyancy cuff   Activity Tolerance Patient tolerated treatment well    Behavior During Therapy [facility] for tasks assessed/performed             Past Medical History:  Diagnosis Date   Alcohol abuse    drinks 1 gallon of Gin every weekend, nothing during the week x >15 yrs   Elevated transaminase level    +elev bili: abd u/s 06/2014 showed stable small hepatic hemangiomas, o/w normal.   Essential hypertension    GERD (gastroesophageal reflux disease)    Hyperlipidemia 03/2014   Atorv started-chol improved   Impaired fasting glucose 03/2014   Nephrolithiasis    PUD (peptic ulcer disease)    Stroke ([place])    Tobacco dependence    chantix: "psych effects"    Past Surgical History:  Procedure Laterality Date   COLONOSCOPY  2005   Recall 10 yrs ([place])   LOOP RECORDER INSERTION N/A 07/03/2019   Procedure: LOOP RECORDER INSERTION;  Surgeon: Thompson Grayer, MD;  Location: [facility];  Service: Cardiovascular;  Laterality: N/A;   removal of kidney stones  2006   cystoscopic--removed from ureter.  No prob since.    There were no vitals filed for this  visit.   Subjective Assessment - 08/30/20 1536     Subjective Denies any falls or changes since last session.    Patient is accompained by: Family member   spouse   Pertinent History PMH: HTN, alcohol abuse (ongoing use), HLD, GERD, PUD, tobacco use    Limitations Standing;Walking;House hold activities    Patient Stated Goals improve strength of L Leg; Be able to walk without dragging LLE    Currently in Pain? No/denies             Aquatic therapy at [facility]  Patient seen for aquatic therapy today.  Treatment took place in water 3.6-4.2 feet deep depending upon activity.  Pt entered and exited the   the pool steps with bil rails and min-guard assist.   Performed bil runners stretch at pool edge x 30 sec     Pt performed bil LE strengthening with ankle buoyancy cuff- seated on bench in pool - LAQ's 2 sets 10 reps with pt using bil. UE's to maintain balance; Marching/hip flexion 2 sets x 10 reps again using UE's to balance.  Pt challenged with hip extension on L with ankle cuff and being able to extend hip due to resistance. Performed bil hip abd/add x 20 reps without cuffs.   Pt gait trained in 3.6-4.2 ft with pool noodle and floatation  belt.  Pt with decreased advancement of LLE and decreased cadence due to decreased strength and balance. Required cues to increase L step length.  Pt required min-min guard assist.     Pt requires buoyancy of water for clearance of LLE with gait.  Buoyancy resisted exercises beneficial for trunk and LE strengthening.  Buoyancy needed for support and for safety for reduced fall risk with activities in standing and with gait training.  Viscosity of water needed for resistance for strengthening of trunk musc. for improved core stabilization and for LE strengthening.           PT Short Term Goals - 08/02/20 1354       PT SHORT TERM GOAL #1   Title Patient will initiate Aquatic Therapy services (All STGS Due: 08/30/20)    Baseline have not yet  participated in aquatic therapy services    Time 4    Period Weeks    Status New    Target Date 08/30/20               PT Long Term Goals - 08/02/20 1358       PT LONG TERM GOAL #1   Title Patient will report completing walking program >/= 20 minutes daily (ALL LTG Due: 08/02/20)    Baseline completing walking program only approx 10 minutes per day    Time 8    Period Weeks    Status Revised      PT LONG TERM GOAL #2   Title Patient will improve 5x sit <> stand to </= 18 secs with BUE support    Baseline 20.31 secs    Time 8    Period Weeks    Status Revised      PT LONG TERM GOAL #3   Title Patient will demo ability to ambulate >/= 11ft with improved upright posture and demonstrate scanning environment with ambulation    Baseline ambulation with increased flexed posture and decreased scanning environment    Time 8    Period Weeks    Status Revised      PT LONG TERM GOAL #4   Title Patient will demo ability to stand with narrow BOS in pool for >/= 30 seconds (approx 4 foot depth) to demonstrate improved balance    Baseline TBA    Time 8    Period Weeks    Status Revised      PT LONG TERM GOAL #5   Title --    Baseline --    Time --    Period --    Status --                   Plan - 08/30/20 1537     Clinical Impression Statement Pt with decreased step length today (even less than prior session) both in water and out.  Discussed importance of increased step length with pt.  Pt tends to lean posteriorly with all activities as well unless the is a low object in front of him to lean forward onto.  Cont per poc.    Personal Factors and Comorbidities Comorbidity 3+    Comorbidities HTN, alcohol abuse (ongoing use), HLD, GERD, PUD, tobacco use    Examination-Activity Limitations Bed Mobility;Dressing;Locomotion Level;Stairs;Stand;Toileting;Transfers;Bathing    Examination-Participation Restrictions Driving;Interpersonal Relationship;Yard Work;Community  Activity    Stability/Clinical Decision Making Evolving/Moderate complexity    Rehab Potential Good    PT Frequency 1x / week    PT Duration 8 weeks  PT Treatment/Interventions ADLs/Self Care Home Management;Electrical Stimulation;DME Instruction;Moist Heat;Cryotherapy;Gait training;Stair training;Functional mobility training;Therapeutic activities;Therapeutic exercise;Balance training;Neuromuscular re-education;Patient/family education;Orthotic Fit/Training;Wheelchair mobility training;Passive range of motion;Aquatic Therapy;Vestibular;Manual techniques;Dry needling;Joint Manipulations;Spinal Manipulations    PT Next Visit Plan Begin Aquatics. Work on standing balance working toward narrow BOS. standing weight shift activities    Consulted and Agree with Plan of Care Patient;Family member/caregiver    Family Member Consulted Wife             Patient will benefit from skilled therapeutic intervention in order to improve the following deficits and impairments:  Abnormal gait, Decreased coordination, Difficulty walking, Impaired tone, Decreased safety awareness, Decreased endurance, Decreased activity tolerance, Pain, Decreased balance, Decreased knowledge of use of DME, Postural dysfunction, Decreased strength, Decreased mobility  Visit Diagnosis: Other abnormalities of gait and mobility  Difficulty in walking, not elsewhere classified  Unsteadiness on feet  Muscle weakness (generalized)     Problem List Patient Active Problem List   Diagnosis Date Noted   H/O ETOH abuse 04/28/2020   History of tobacco abuse 01/07/2020   Hemiparesis affecting left side as late effect of stroke ([place]) 01/07/2020   Abnormality of gait 09/24/2019   AKI (acute kidney injury) ([place])    Fever    Sepsis without acute organ dysfunction (HCC)    Elevated BUN    Blood pressure increase diastolic    Labile blood pressure    Leukopenia    Benign essential HTN    Hypoalbuminemia    Cerebrovascular  accident (CVA) of right basal ganglia ([place]) 07/07/2019   Acute ischemic stroke ([place])    Dyslipidemia    Dysphagia, post-stroke    Polycythemia    Stroke ([place]) 06/30/2019   Prediabetes 12/23/2016   Elevated LFTs 12/23/2016   Pure hypercholesterolemia 12/21/2016   Alcoholism ([place]) 12/21/2016   Eczema 09/18/2013   Essential hypertension 09/18/2013   Gastroesophageal reflux disease without esophagitis 09/18/2013   Tobacco abuse 09/18/2013   Narda Bonds, PTA [facility] 08/30/20 4:32 PM Phone: [phone] Fax: [facility] [place] [facility] [address] [place], [name], [address] Phone: [phone]   Fax:  [phone]  Name: Russell Russell MRN: [id number] Date of Birth: 10-15-1950

## 2020-09-03 DIAGNOSIS — A419 Sepsis, unspecified organism: Secondary | ICD-10-CM | POA: Diagnosis not present

## 2020-09-03 DIAGNOSIS — Z720 Tobacco use: Secondary | ICD-10-CM | POA: Diagnosis not present

## 2020-09-03 DIAGNOSIS — R0989 Other specified symptoms and signs involving the circulatory and respiratory systems: Secondary | ICD-10-CM | POA: Diagnosis not present

## 2020-09-03 DIAGNOSIS — F1010 Alcohol abuse, uncomplicated: Secondary | ICD-10-CM | POA: Diagnosis not present

## 2020-09-03 DIAGNOSIS — I639 Cerebral infarction, unspecified: Secondary | ICD-10-CM | POA: Diagnosis not present

## 2020-09-03 DIAGNOSIS — R030 Elevated blood-pressure reading, without diagnosis of hypertension: Secondary | ICD-10-CM | POA: Diagnosis not present

## 2020-09-03 DIAGNOSIS — E8809 Other disorders of plasma-protein metabolism, not elsewhere classified: Secondary | ICD-10-CM | POA: Diagnosis not present

## 2020-09-03 DIAGNOSIS — R509 Fever, unspecified: Secondary | ICD-10-CM | POA: Diagnosis not present

## 2020-09-03 DIAGNOSIS — D72819 Decreased white blood cell count, unspecified: Secondary | ICD-10-CM | POA: Diagnosis not present

## 2020-09-06 ENCOUNTER — Other Ambulatory Visit: Payer: Self-pay

## 2020-09-06 ENCOUNTER — Ambulatory Visit: Payer: Medicare HMO | Admitting: Physical Therapy

## 2020-09-06 ENCOUNTER — Ambulatory Visit: Payer: Self-pay | Admitting: Physical Therapy

## 2020-09-06 DIAGNOSIS — R278 Other lack of coordination: Secondary | ICD-10-CM | POA: Diagnosis not present

## 2020-09-06 DIAGNOSIS — R2689 Other abnormalities of gait and mobility: Secondary | ICD-10-CM | POA: Diagnosis not present

## 2020-09-06 DIAGNOSIS — R262 Difficulty in walking, not elsewhere classified: Secondary | ICD-10-CM

## 2020-09-06 DIAGNOSIS — R2681 Unsteadiness on feet: Secondary | ICD-10-CM | POA: Diagnosis not present

## 2020-09-06 DIAGNOSIS — R482 Apraxia: Secondary | ICD-10-CM | POA: Diagnosis not present

## 2020-09-06 DIAGNOSIS — M6281 Muscle weakness (generalized): Secondary | ICD-10-CM | POA: Diagnosis not present

## 2020-09-06 DIAGNOSIS — R41840 Attention and concentration deficit: Secondary | ICD-10-CM | POA: Diagnosis not present

## 2020-09-06 DIAGNOSIS — I69352 Hemiplegia and hemiparesis following cerebral infarction affecting left dominant side: Secondary | ICD-10-CM | POA: Diagnosis not present

## 2020-09-06 NOTE — Progress Notes (Signed)
[facility]link Summary Report / Loop Recorder 

## 2020-09-06 NOTE — Therapy (Signed)
[facility] [address] [place], [name], [address] Phone: [phone]   Fax:  [phone]  Physical Therapy Treatment  Patient Details  Name: Russell Russell MRN: [id number] Date of Birth: 23-Nov-1950 Referring Provider (PT): Russell Arn, MD   Encounter Date: 09/06/2020   PT End of Session - 09/06/20 1528     Visit Number 12    Number of Visits 16    Date for PT Re-Evaluation 10/31/20   POC for 8 weeks, Cert for 90 days   Authorization Type [employer] (medicare guidelines, 10th Visit PN)    Authorization Time Period awaiting new authorization    Progress Note Due on Visit 10    PT Start Time 1100    PT Stop Time 1145    PT Time Calculation (min) 45 min    Equipment Utilized During Treatment Other (comment)   floatation belt, pool noodle, buoyancy cuff   Activity Tolerance Patient tolerated treatment well    Behavior During Therapy [facility] for tasks assessed/performed             Past Medical History:  Diagnosis Date   Alcohol abuse    drinks 1 gallon of Gin every weekend, nothing during the week x >15 yrs   Elevated transaminase level    +elev bili: abd u/s 06/2014 showed stable small hepatic hemangiomas, o/w normal.   Essential hypertension    GERD (gastroesophageal reflux disease)    Hyperlipidemia 03/2014   Atorv started-chol improved   Impaired fasting glucose 03/2014   Nephrolithiasis    PUD (peptic ulcer disease)    Stroke ([place])    Tobacco dependence    chantix: "psych effects"    Past Surgical History:  Procedure Laterality Date   COLONOSCOPY  2005   Recall 10 yrs ([place])   LOOP RECORDER INSERTION N/A 07/03/2019   Procedure: LOOP RECORDER INSERTION;  Surgeon: Russell Grayer, MD;  Location: [facility];  Service: Cardiovascular;  Laterality: N/A;   removal of kidney stones  2006   cystoscopic--removed from ureter.  No prob since.    There were no vitals filed for this  visit.   Subjective Assessment - 09/06/20 1528     Subjective Denies any falls or changes since last session.    Patient is accompained by: Family member   spouse   Pertinent History PMH: HTN, alcohol abuse (ongoing use), HLD, GERD, PUD, tobacco use    Limitations Standing;Walking;House hold activities    Patient Stated Goals improve strength of L Leg; Be able to walk without dragging LLE    Currently in Pain? No/denies            Aquatic therapy at [facility]  Patient seen for aquatic therapy today.  Treatment took place in water 3.6-4.2 feet deep depending upon activity.  Pt entered and exited the   the pool steps with bil rails and min-guard assist.   Performed bil runners stretch at pool edge x 30 sec      Pt performed bil LE strengthening with ankle buoyancy cuff- seated on bench in pool - LAQ's 2 sets 10 reps with pt using bil. UE's to maintain balance; Marching/hip flexion 2 sets x 10 reps again using UE's to balance.  Pt challenged with hip extension on L with ankle cuff and being able to extend hip due to resistance. Performed bil hip abd/add x 20 reps without cuffs.   Pt gait trained in 3.6-4.2 ft with pool noodle and floatation  belt.  Pt with decreased advancement of LLE and decreased cadence due to decreased strength and balance. Required cues to increase L step length.  Pt required min-min guard assist.  Performed gait training without UE support and PTA holding by gait belt.  Pt with decreased step length especially on L and with posterior lean.     Standing at pool wall for bil LE squats x 10 reps then single leg x 10 reps.  Pt requires buoyancy of water for clearance of LLE with gait.  Buoyancy resisted exercises beneficial for trunk and LE strengthening.  Buoyancy needed for support and for safety for reduced fall risk with activities in standing and with gait training.  Viscosity of water needed for resistance for strengthening of trunk musc. for improved core  stabilization and for LE strengthening.      PT Short Term Goals - 08/02/20 1354       PT SHORT TERM GOAL #1   Title Patient will initiate Aquatic Therapy services (All STGS Due: 08/30/20)    Baseline have not yet participated in aquatic therapy services    Time 4    Period Weeks    Status New    Target Date 08/30/20               PT Long Term Goals - 08/02/20 1358       PT LONG TERM GOAL #1   Title Patient will report completing walking program >/= 20 minutes daily (ALL LTG Due: 08/02/20)    Baseline completing walking program only approx 10 minutes per day    Time 8    Period Weeks    Status Revised      PT LONG TERM GOAL #2   Title Patient will improve 5x sit <> stand to </= 18 secs with BUE support    Baseline 20.31 secs    Time 8    Period Weeks    Status Revised      PT LONG TERM GOAL #3   Title Patient will demo ability to ambulate >/= 35ft with improved upright posture and demonstrate scanning environment with ambulation    Baseline ambulation with increased flexed posture and decreased scanning environment    Time 8    Period Weeks    Status Revised      PT LONG TERM GOAL #4   Title Patient will demo ability to stand with narrow BOS in pool for >/= 30 seconds (approx 4 foot depth) to demonstrate improved balance    Baseline TBA    Time 8    Period Weeks    Status Revised      PT LONG TERM GOAL #5   Title --    Baseline --    Time --    Period --    Status --                   Plan - 09/06/20 1529     Clinical Impression Statement Skilled aquatic session focused on gait, balance and strength.  Pt continues with forward lean onto RW to compensate for signifiant posterior lean.  Cont per poc.    Personal Factors and Comorbidities Comorbidity 3+    Comorbidities HTN, alcohol abuse (ongoing use), HLD, GERD, PUD, tobacco use    Examination-Activity Limitations Bed Mobility;Dressing;Locomotion Level;Stairs;Stand;Toileting;Transfers;Bathing     Examination-Participation Restrictions Driving;Interpersonal Relationship;Yard Work;Community Activity    Stability/Clinical Decision Making Evolving/Moderate complexity    Rehab Potential Good    PT Frequency 1x /  week    PT Duration 8 weeks    PT Treatment/Interventions ADLs/Self Care Home Management;Electrical Stimulation;DME Instruction;Moist Heat;Cryotherapy;Gait training;Stair training;Functional mobility training;Therapeutic activities;Therapeutic exercise;Balance training;Neuromuscular re-education;Patient/family education;Orthotic Fit/Training;Wheelchair mobility training;Passive range of motion;Aquatic Therapy;Vestibular;Manual techniques;Dry needling;Joint Manipulations;Spinal Manipulations    PT Next Visit Plan Begin Aquatics. Work on standing balance working toward narrow BOS. standing weight shift activities    Consulted and Agree with Plan of Care Patient;Family member/caregiver    Family Member Consulted Wife             Patient will benefit from skilled therapeutic intervention in order to improve the following deficits and impairments:  Abnormal gait, Decreased coordination, Difficulty walking, Impaired tone, Decreased safety awareness, Decreased endurance, Decreased activity tolerance, Pain, Decreased balance, Decreased knowledge of use of DME, Postural dysfunction, Decreased strength, Decreased mobility  Visit Diagnosis: Other abnormalities of gait and mobility  Difficulty in walking, not elsewhere classified  Unsteadiness on feet  Muscle weakness (generalized)     Problem List Patient Active Problem List   Diagnosis Date Noted   H/O ETOH abuse 04/28/2020   History of tobacco abuse 01/07/2020   Hemiparesis affecting left side as late effect of stroke ([place]) 01/07/2020   Abnormality of gait 09/24/2019   AKI (acute kidney injury) ([place])    Fever    Sepsis without acute organ dysfunction (HCC)    Elevated BUN    Blood pressure increase diastolic    Labile  blood pressure    Leukopenia    Benign essential HTN    Hypoalbuminemia    Cerebrovascular accident (CVA) of right basal ganglia ([place]) 07/07/2019   Acute ischemic stroke ([place])    Dyslipidemia    Dysphagia, post-stroke    Polycythemia    Stroke ([place]) 06/30/2019   Prediabetes 12/23/2016   Elevated LFTs 12/23/2016   Pure hypercholesterolemia 12/21/2016   Alcoholism ([place]) 12/21/2016   Eczema 09/18/2013   Essential hypertension 09/18/2013   Gastroesophageal reflux disease without esophagitis 09/18/2013   Tobacco abuse 09/18/2013   Narda Bonds, PTA [place] 09/06/20 3:33 PM Phone: [phone] Fax: [facility] [address] [place], [name], [address] Phone: [phone]   Fax:  [phone]  Name: Russell Russell MRN: [id number] Date of Birth: 1950-03-26

## 2020-09-08 ENCOUNTER — Other Ambulatory Visit: Payer: Self-pay | Admitting: Physician Assistant

## 2020-09-13 ENCOUNTER — Other Ambulatory Visit: Payer: Self-pay

## 2020-09-13 ENCOUNTER — Ambulatory Visit: Payer: Medicare HMO | Attending: Physician Assistant | Admitting: Physical Therapy

## 2020-09-13 DIAGNOSIS — R2681 Unsteadiness on feet: Secondary | ICD-10-CM | POA: Diagnosis not present

## 2020-09-13 DIAGNOSIS — R262 Difficulty in walking, not elsewhere classified: Secondary | ICD-10-CM | POA: Insufficient documentation

## 2020-09-13 DIAGNOSIS — R2689 Other abnormalities of gait and mobility: Secondary | ICD-10-CM | POA: Diagnosis not present

## 2020-09-13 DIAGNOSIS — M6281 Muscle weakness (generalized): Secondary | ICD-10-CM | POA: Diagnosis not present

## 2020-09-13 NOTE — Therapy (Signed)
[facility] [address] [place], [name], [address] Phone: [phone]   Fax:  [phone]  Physical Therapy Treatment  Patient Details  Name: Russell Russell MRN: [id number] Date of Birth: 03-Dec-1950 Referring Provider (PT): Jamse Arn, MD   Encounter Date: 09/13/2020   PT End of Session - 09/13/20 1422     Visit Number 13    Number of Visits 16    Date for PT Re-Evaluation 10/31/20   POC for 8 weeks, Cert for 90 days   Authorization Type [employer] (medicare guidelines, 10th Visit PN)    Authorization Time Period awaiting new authorization    Progress Note Due on Visit 10    PT Start Time 0930    PT Stop Time 1015    PT Time Calculation (min) 45 min    Equipment Utilized During Treatment Other (comment)   pool noodle, buoyancy cuff   Activity Tolerance Patient tolerated treatment well    Behavior During Therapy [facility] for tasks assessed/performed             Past Medical History:  Diagnosis Date   Alcohol abuse    drinks 1 gallon of Gin every weekend, nothing during the week x >15 yrs   Elevated transaminase level    +elev bili: abd u/s 06/2014 showed stable small hepatic hemangiomas, o/w normal.   Essential hypertension    GERD (gastroesophageal reflux disease)    Hyperlipidemia 03/2014   Atorv started-chol improved   Impaired fasting glucose 03/2014   Nephrolithiasis    PUD (peptic ulcer disease)    Stroke ([place])    Tobacco dependence    chantix: "psych effects"    Past Surgical History:  Procedure Laterality Date   COLONOSCOPY  2005   Recall 10 yrs ([place])   LOOP RECORDER INSERTION N/A 07/03/2019   Procedure: LOOP RECORDER INSERTION;  Surgeon: [place] Grayer, MD;  Location: [facility];  Service: Cardiovascular;  Laterality: N/A;   removal of kidney stones  2006   cystoscopic--removed from ureter.  No prob since.    There were no vitals filed for this visit.   Subjective  Assessment - 09/13/20 1421     Subjective Denies any falls or changes.  Wife reports pt continues to have decreased LLE strength and decreased step length on L.    Patient is accompained by: Family member   spouse   Pertinent History PMH: HTN, alcohol abuse (ongoing use), HLD, GERD, PUD, tobacco use    Limitations Standing;Walking;House hold activities    Patient Stated Goals improve strength of L Leg; Be able to walk without dragging LLE    Currently in Pain? No/denies            Aquatic therapy at [facility]  Patient seen for aquatic therapy today.  Treatment took place in water 3.6-4.2 feet deep depending upon activity.  Pt entered and exited the   the pool steps with bil rails and min-guard assist.   Performed bil runners stretch at pool edge x 30 sec     Pt gait trained in 3.6-4.2 ft with pool noodle and gait belt.  Pt with decreased advancement of LLE and decreased cadence due to decreased strength and balance. Required cues to increase L step length.  Pt required min-min guard assist.  Performed gait training without UE support and PTA holding by gait belt.  Pt with decreased step length especially on L and with posterior lean.     Standing  at pool wall for bil LE squats x 10 reps then single leg x 10 reps.   Standing at pool wall for LLE strengthening with ankle cuff for hip flexion, hip extension, hip flexion moving into extension, hip abd and hamstring curl-performed x 15 reps each side.  Standing without UE support with CGA of PTA for brief pds working on standing balance.    Pt requires buoyancy of water for clearance of LLE with gait.  Buoyancy resisted exercises beneficial for trunk and LE strengthening.  Buoyancy needed for support and for safety for reduced fall risk with activities in standing and with gait training.  Viscosity of water needed for resistance for strengthening of trunk musc. for improved core stabilization and for LE strengthening.     PT Short Term  Goals - 08/02/20 1354       PT SHORT TERM GOAL #1   Title Patient will initiate Aquatic Therapy services (All STGS Due: 08/30/20)    Baseline have not yet participated in aquatic therapy services    Time 4    Period Weeks    Status New    Target Date 08/30/20               PT Long Term Goals - 08/02/20 1358       PT LONG TERM GOAL #1   Title Patient will report completing walking program >/= 20 minutes daily (ALL LTG Due: 08/02/20)    Baseline completing walking program only approx 10 minutes per day    Time 8    Period Weeks    Status Revised      PT LONG TERM GOAL #2   Title Patient will improve 5x sit <> stand to </= 18 secs with BUE support    Baseline 20.31 secs    Time 8    Period Weeks    Status Revised      PT LONG TERM GOAL #3   Title Patient will demo ability to ambulate >/= 70ft with improved upright posture and demonstrate scanning environment with ambulation    Baseline ambulation with increased flexed posture and decreased scanning environment    Time 8    Period Weeks    Status Revised      PT LONG TERM GOAL #4   Title Patient will demo ability to stand with narrow BOS in pool for >/= 30 seconds (approx 4 foot depth) to demonstrate improved balance    Baseline TBA    Time 8    Period Weeks    Status Revised      PT LONG TERM GOAL #5   Title --    Baseline --    Time --    Period --    Status --                   Plan - 09/13/20 1423     Clinical Impression Statement Skilled aquatic session continued to focus on gait, balance and strength.  Pt continues with decreased step length on L, heavy reliance on UE support and poor upirght posture.  Pt to return to clinic per primary PT for next 3 sessions.  Cont PT per poc on land.    Personal Factors and Comorbidities Comorbidity 3+    Comorbidities HTN, alcohol abuse (ongoing use), HLD, GERD, PUD, tobacco use    Examination-Activity Limitations Bed Mobility;Dressing;Locomotion  Level;Stairs;Stand;Toileting;Transfers;Bathing    Examination-Participation Restrictions Driving;Interpersonal Relationship;Yard Work;Community Activity    Stability/Clinical Decision Making Evolving/Moderate complexity  Rehab Potential Good    PT Frequency 1x / week    PT Duration 8 weeks    PT Treatment/Interventions ADLs/Self Care Home Management;Electrical Stimulation;DME Instruction;Moist Heat;Cryotherapy;Gait training;Stair training;Functional mobility training;Therapeutic activities;Therapeutic exercise;Balance training;Neuromuscular re-education;Patient/family education;Orthotic Fit/Training;Wheelchair mobility training;Passive range of motion;Aquatic Therapy;Vestibular;Manual techniques;Dry needling;Joint Manipulations;Spinal Manipulations    PT Next Visit Plan Return to clinic for remaining visits in current POC.  Work on standing balance working toward narrow BOS. standing weight shift activities    Consulted and Agree with Plan of Care Patient;Family member/caregiver    Family Member Consulted [name]             Patient will benefit from skilled therapeutic intervention in order to improve the following deficits and impairments:  Abnormal gait, Decreased coordination, Difficulty walking, Impaired tone, Decreased safety awareness, Decreased endurance, Decreased activity tolerance, Pain, Decreased balance, Decreased knowledge of use of DME, Postural dysfunction, Decreased strength, Decreased mobility  Visit Diagnosis: Other abnormalities of gait and mobility  Difficulty in walking, not elsewhere classified  Unsteadiness on feet  Muscle weakness (generalized)     Problem List Patient Active Problem List   Diagnosis Date Noted   H/O ETOH abuse 04/28/2020   History of tobacco abuse 01/07/2020   Hemiparesis affecting left side as late effect of stroke ([place]) 01/07/2020   Abnormality of gait 09/24/2019   AKI (acute kidney injury) ([place])    Fever    Sepsis without  acute organ dysfunction (HCC)    Elevated BUN    Blood pressure increase diastolic    Labile blood pressure    Leukopenia    Benign essential HTN    Hypoalbuminemia    Cerebrovascular accident (CVA) of right basal ganglia ([place]) 07/07/2019   Acute ischemic stroke ([place])    Dyslipidemia    Dysphagia, post-stroke    Polycythemia    Stroke ([place]) 06/30/2019   Prediabetes 12/23/2016   Elevated LFTs 12/23/2016   Pure hypercholesterolemia 12/21/2016   Alcoholism ([place]) 12/21/2016   Eczema 09/18/2013   Essential hypertension 09/18/2013   Gastroesophageal reflux disease without esophagitis 09/18/2013   Tobacco abuse 09/18/2013   Narda Bonds, PTA [facility] 09/13/20 2:29 PM Phone: [phone] Fax: [facility] [address] [place], [name], [address] Phone: [phone]   Fax:  [phone]  Name: Russell Russell MRN: [id number] Date of Birth: [birth year]

## 2020-09-18 LAB — CUP PACEART REMOTE DEVICE CHECK
Date Time Interrogation Session: 20220705230525
Implantable Pulse Generator Implant Date: 20210423

## 2020-09-19 ENCOUNTER — Ambulatory Visit (INDEPENDENT_AMBULATORY_CARE_PROVIDER_SITE_OTHER): Payer: Medicare HMO

## 2020-09-19 DIAGNOSIS — I6381 Other cerebral infarction due to occlusion or stenosis of small artery: Secondary | ICD-10-CM

## 2020-09-23 ENCOUNTER — Other Ambulatory Visit: Payer: Self-pay

## 2020-09-23 ENCOUNTER — Ambulatory Visit: Payer: Medicare HMO

## 2020-09-23 DIAGNOSIS — R262 Difficulty in walking, not elsewhere classified: Secondary | ICD-10-CM

## 2020-09-23 DIAGNOSIS — R2681 Unsteadiness on feet: Secondary | ICD-10-CM | POA: Diagnosis not present

## 2020-09-23 DIAGNOSIS — R2689 Other abnormalities of gait and mobility: Secondary | ICD-10-CM | POA: Diagnosis not present

## 2020-09-23 DIAGNOSIS — M6281 Muscle weakness (generalized): Secondary | ICD-10-CM | POA: Diagnosis not present

## 2020-09-23 NOTE — Therapy (Signed)
[facility] [address] [place], [name], [address] Phone: [phone]   Fax:  [phone]  Physical Therapy Treatment  Patient Details  Name: Russell Russell MRN: [id number] Date of Birth: 02/15/1951 Referring Provider (PT): Jamse Arn, MD   Encounter Date: 09/23/2020   PT End of Session - 09/23/20 1103     Visit Number 14    Number of Visits 16    Date for PT Re-Evaluation 10/31/20   POC for 8 weeks, Cert for 90 days   Authorization Type [employer] (medicare guidelines, 10th Visit PN)    Authorization Time Period awaiting new authorization    Progress Note Due on Visit 10    PT Start Time 1102    PT Stop Time 1144    PT Time Calculation (min) 42 min    Equipment Utilized During Treatment  Gait Belt   Activity Tolerance Patient tolerated treatment well    Behavior During Therapy WFL for tasks assessed/performed             Past Medical History:  Diagnosis Date   Alcohol abuse    drinks 1 gallon of Gin every weekend, nothing during the week x >15 yrs   Elevated transaminase level    +elev bili: abd u/s 06/2014 showed stable small hepatic hemangiomas, o/w normal.   Essential hypertension    GERD (gastroesophageal reflux disease)    Hyperlipidemia 03/2014   Atorv started-chol improved   Impaired fasting glucose 03/2014   Nephrolithiasis    PUD (peptic ulcer disease)    Stroke ([place])    Tobacco dependence    chantix: "psych effects"    Past Surgical History:  Procedure Laterality Date   COLONOSCOPY  2005   Recall 10 yrs ([place])   LOOP RECORDER INSERTION N/A 07/03/2019   Procedure: LOOP RECORDER INSERTION;  Surgeon: Thompson Grayer, MD;  Location: [facility];  Service: Cardiovascular;  Laterality: N/A;   removal of kidney stones  2006   cystoscopic--removed from ureter.  No prob since.    There were no vitals filed for this visit.   Subjective Assessment - 09/23/20 1106      Subjective Patient reports that enjoyed the pool. Wife reports he has had a few times where the left leg will get caught and have near misses, but no falls. No pain. Also gets to move into their new home this weekend.    Patient is accompained by: Family member   spouse   Pertinent History PMH: HTN, alcohol abuse (ongoing use), HLD, GERD, PUD, tobacco use    Limitations Standing;Walking;House hold activities    Patient Stated Goals improve strength of L Leg; Be able to walk without dragging LLE    Currently in Pain? No/denies                [facility] Adult PT Treatment/Exercise - 09/23/20 0001       Transfers   Transfers Sit to Stand;Stand to Sit    Sit to Stand 5: Supervision    Stand to Sit 5: Supervision      Ambulation/Gait   Ambulation/Gait Yes    Ambulation/Gait Assistance 5: Supervision;4: Min guard    Ambulation/Gait Assistance Details continued gait training with RW with focus on improved step length on LLE x 115 ft. PT trialed various AFO today with ambulation to promote toe clearance and safety with ambulation. Initially trialed L Posterior Ottobok x 115 ft with improvements in gait noted but still demo  difficulty with clearance. Further trialed AFO with use of L Thuasne PSL x 115 ft. Improvements noted in clearance and gait pattern, still some impairments noted due to hip flexion/knee flexion weakness. PT educating on purpose of AFO and process of obtaining personal AFO through insurance. Paitent and wife very interested in starting process.    Ambulation Distance (Feet) 115 Feet   x 3   Assistive device Rolling walker    Gait Pattern Step-through pattern;Step-to pattern;Decreased stride length;Decreased stance time - right;Decreased step length - left    Ambulation Surface Level;Indoor      Exercises   Exercises Knee/Hip      Knee/Hip Exercises: Aerobic   Other Aerobic Scifit level 2.5 with LE's only for 6 minutes with goal >/= 40 rpm for strengthening/activity  tolerance/and recirpocal motion.                    PT Education - 09/23/20 1145     Education Details Process of obtaining AFO    Person(s) Educated Patient;Spouse    Methods Explanation    Comprehension Verbalized understanding              PT Short Term Goals - 08/02/20 1354       PT SHORT TERM GOAL #1   Title Patient will initiate Aquatic Therapy services (All STGS Due: 08/30/20)    Baseline have not yet participated in aquatic therapy services    Time 4    Period Weeks    Status New    Target Date 08/30/20               PT Long Term Goals - 08/02/20 1358       PT LONG TERM GOAL #1   Title Patient will report completing walking program >/= 20 minutes daily (ALL LTG Due: 08/02/20)    Baseline completing walking program only approx 10 minutes per day    Time 8    Period Weeks    Status Revised      PT LONG TERM GOAL #2   Title Patient will improve 5x sit <> stand to </= 18 secs with BUE support    Baseline 20.31 secs    Time 8    Period Weeks    Status Revised      PT LONG TERM GOAL #3   Title Patient will demo ability to ambulate >/= 24ft with improved upright posture and demonstrate scanning environment with ambulation    Baseline ambulation with increased flexed posture and decreased scanning environment    Time 8    Period Weeks    Status Revised      PT LONG TERM GOAL #4   Title Patient will demo ability to stand with narrow BOS in pool for >/= 30 seconds (approx 4 foot depth) to demonstrate improved balance    Baseline TBA    Time 8    Period Weeks    Status Revised      PT LONG TERM GOAL #5   Title --    Baseline --    Time --    Period --    Status --                   Plan - 09/23/20 1237     Clinical Impression Statement patient returns to land PT services after participation in aquatic therapy. Completed gait training with trial AFO today during session for improved gait pattern and toe clearance. Patient  demosntrating most  improvements in gait pattern with L Thuasne PLS. Will continue to trial with therapy session and begin process of obtaining patient personal AFO for LLE.    Personal Factors and Comorbidities Comorbidity 3+    Comorbidities HTN, alcohol abuse (ongoing use), HLD, GERD, PUD, tobacco use    Examination-Activity Limitations Bed Mobility;Dressing;Locomotion Level;Stairs;Stand;Toileting;Transfers;Bathing    Examination-Participation Restrictions Driving;Interpersonal Relationship;Yard Work;Community Activity    Stability/Clinical Decision Making Evolving/Moderate complexity    Rehab Potential Good    PT Frequency 1x / week    PT Duration 8 weeks    PT Treatment/Interventions ADLs/Self Care Home Management;Electrical Stimulation;DME Instruction;Moist Heat;Cryotherapy;Gait training;Stair training;Functional mobility training;Therapeutic activities;Therapeutic exercise;Balance training;Neuromuscular re-education;Patient/family education;Orthotic Fit/Training;Wheelchair mobility training;Passive range of motion;Aquatic Therapy;Vestibular;Manual techniques;Dry needling;Joint Manipulations;Spinal Manipulations    PT Next Visit Plan Cotninue with use of L PLS Thuasne.  Work on standing balance working toward narrow BOS. standing weight shift activities    Consulted and Agree with Plan of Care Patient;Family member/caregiver    Family Member Consulted [name]             Patient will benefit from skilled therapeutic intervention in order to improve the following deficits and impairments:  Abnormal gait, Decreased coordination, Difficulty walking, Impaired tone, Decreased safety awareness, Decreased endurance, Decreased activity tolerance, Pain, Decreased balance, Decreased knowledge of use of DME, Postural dysfunction, Decreased strength, Decreased mobility  Visit Diagnosis: Other abnormalities of gait and mobility  Difficulty in walking, not elsewhere classified  Unsteadiness on  feet  Muscle weakness (generalized)     Problem List Patient Active Problem List   Diagnosis Date Noted   H/O ETOH abuse 04/28/2020   History of tobacco abuse 01/07/2020   Hemiparesis affecting left side as late effect of stroke ([place]) 01/07/2020   Abnormality of gait 09/24/2019   AKI (acute kidney injury) ([place])    Fever    Sepsis without acute organ dysfunction (HCC)    Elevated BUN    Blood pressure increase diastolic    Labile blood pressure    Leukopenia    Benign essential HTN    Hypoalbuminemia    Cerebrovascular accident (CVA) of right basal ganglia ([place]) 07/07/2019   Acute ischemic stroke ([place])    Dyslipidemia    Dysphagia, post-stroke    Polycythemia    Stroke ([place]) 06/30/2019   Prediabetes 12/23/2016   Elevated LFTs 12/23/2016   Pure hypercholesterolemia 12/21/2016   Alcoholism ([place]) 12/21/2016   Eczema 09/18/2013   Essential hypertension 09/18/2013   Gastroesophageal reflux disease without esophagitis 09/18/2013   Tobacco abuse 09/18/2013    Jones Bales, PT, DPT 09/23/2020, 12:39 PM  [facility] [address] [place], [name], [address] Phone: [phone]   Fax:  [phone]  Name: Russell Russell MRN: [id number] Date of Birth: 08-Sep-1950

## 2020-09-26 ENCOUNTER — Telehealth: Payer: Self-pay | Admitting: Internal Medicine

## 2020-09-26 ENCOUNTER — Other Ambulatory Visit: Payer: Self-pay

## 2020-09-26 ENCOUNTER — Telehealth: Payer: Self-pay

## 2020-09-26 ENCOUNTER — Ambulatory Visit: Payer: Medicare HMO

## 2020-09-26 DIAGNOSIS — R2681 Unsteadiness on feet: Secondary | ICD-10-CM

## 2020-09-26 DIAGNOSIS — M6281 Muscle weakness (generalized): Secondary | ICD-10-CM | POA: Diagnosis not present

## 2020-09-26 DIAGNOSIS — R262 Difficulty in walking, not elsewhere classified: Secondary | ICD-10-CM

## 2020-09-26 DIAGNOSIS — R2689 Other abnormalities of gait and mobility: Secondary | ICD-10-CM | POA: Diagnosis not present

## 2020-09-26 NOTE — Telephone Encounter (Addendum)
**Note De-Identified Russell Russell Obfuscation** Russell Russell (DPR), the pts wife states that the pt has been getting his Xarelto from [facility] and that the cost of his Xarelto has gone from $125 to $215 for a 90 day supply.  I advised her that normally Gwendel Hanson is $85 per month or $240 for a 3 month supply and asked if she is referring to the pts ins plan as she was also stating that [facility] advised her that they are switching to a new pharmacy that starts with the letter C and that is why the cost went up.  I encourage her to call [facility] but she states that she already has and they are the ones who gave her these prices.  We discussed pt asst through [place] but she insists that they have denied him in the past and is not interested in applying at this time.  I advised that we noted that he was denied in December of 2021 because he had ins coverage.   I advised her to contact [employer] at the number on the back of the pts card to ask questions concerning his medication coverage to see if he has the best plan for his needs and again advised that she call [facility] as the prices she gave me are not what they charge for Xarelto.  She states that the pt currently has a 90 day supply of Xarelto on hand and that she will check options and will address again in 90 days if need be. She thanked me for calling her back.

## 2020-09-26 NOTE — Therapy (Signed)
[facility] [address] [place], [name], [address] Phone: [phone]   Fax:  [phone]  Physical Therapy Treatment  Patient Details  Name: Russell Russell MRN: [id number] Date of Birth: [birth year] Referring Provider (PT): Jamse Arn, MD   Encounter Date: 09/26/2020   PT End of Session - 09/26/20 0852     Visit Number 15    Number of Visits 16    Date for PT Re-Evaluation 10/31/20   POC for 8 weeks, Cert for 90 days   Authorization Type [employer] (medicare guidelines, 10th Visit PN)    Authorization Time Period awaiting new authorization    Progress Note Due on Visit 10    PT Start Time [id number]    PT Stop Time 0930    PT Time Calculation (min) 43 min    Equipment Utilized During Treatment Other (comment)   pool noodle, buoyancy cuff   Activity Tolerance Patient tolerated treatment well    Behavior During Therapy [facility] for tasks assessed/performed             Past Medical History:  Diagnosis Date   Alcohol abuse    drinks 1 gallon of Gin every weekend, nothing during the week x >15 yrs   Elevated transaminase level    +elev bili: abd u/s 06/2014 showed stable small hepatic hemangiomas, o/w normal.   Essential hypertension    GERD (gastroesophageal reflux disease)    Hyperlipidemia 03/2014   Atorv started-chol improved   Impaired fasting glucose 03/2014   Nephrolithiasis    PUD (peptic ulcer disease)    Stroke ([place])    Tobacco dependence    chantix: "psych effects"    Past Surgical History:  Procedure Laterality Date   COLONOSCOPY  2005   Recall 10 yrs ([place])   LOOP RECORDER INSERTION N/A 07/03/2019   Procedure: LOOP RECORDER INSERTION;  Surgeon: Thompson Grayer, MD;  Location: [facility];  Service: Cardiovascular;  Laterality: N/A;   removal of kidney stones  2006   cystoscopic--removed from ureter.  No prob since.    There were no vitals filed for this visit.   Subjective  Assessment - 09/26/20 0852     Subjective No new changes. Enjoyed warm up on [employer] last time. No pain.    Patient is accompained by: Family member   spouse   Pertinent History PMH: HTN, alcohol abuse (ongoing use), HLD, GERD, PUD, tobacco use    Limitations Standing;Walking;House hold activities    Patient Stated Goals improve strength of L Leg; Be able to walk without dragging LLE    Currently in Pain? No/denies              [facility] Adult PT Treatment/Exercise - 09/26/20 0001       Transfers   Transfers Sit to Stand;Stand to Sit    Sit to Stand 5: Supervision    Stand to Sit 5: Supervision      Ambulation/Gait   Ambulation/Gait Yes    Ambulation/Gait Assistance 5: Supervision;4: Min guard    Ambulation/Gait Assistance Details Continued gait training with L Thuasne PLS AFO and RW x 230 ft. Patient continue to demo decreased hip/knee flexion with ambulation affecting clearance. Continue to require verbal cues and facilitation at pelvis for improved weight shift to promote step length bilaterally. PT providing cues for large exaggerated step to promote step length with patient dmeo improvements.    Ambulation Distance (Feet) 230 Feet    Assistive device Rolling walker  Gait Pattern Step-through pattern;Step-to pattern;Decreased stride length;Decreased stance time - right;Decreased step length - left    Ambulation Surface Level;Indoor      Neuro Re-ed    Neuro Re-ed Details  Standing with out UE support completed staggered stance weight shift x 10 reps, then alternating foot position and completed second set x 10 reps. PT providing manual facilation for improved weight shift at pelvis, completed with UE support initially and progressed to no UE support.      Exercises   Exercises Knee/Hip      Knee/Hip Exercises: Aerobic   Other Aerobic Scifit level 2.5 with BLE and BUE for 6 minutes with goal >/= 40 rpm for strengthening/activity tolerance/and recirpocal motion.                Balance Exercises - 09/26/20 0001       Balance Exercises: Standing   Standing Eyes Opened Solid surface;Narrow base of support (BOS);2 reps;30 secs;Limitations    Standing Eyes Opened Limitations on firm surface without UE support, PT providing cues for posture.    Stepping Strategy Lateral;Anterior;UE support;Limitations    Stepping Strategy Limitations completed with single UE support completed lateral stepping strategy bilat x 10 reps, followed by anterior stepping strategy x 15 reps bilat. Increased challenge with LLE overall, require freq verbal ceus for proper completion.    Marching Solid surface;Upper extremity assist 1;Static;Limitations    Marching Limitations completed standing alteranting marching with target x 15 reps bilaterally. Single UE support utilized with completion.                 PT Short Term Goals - 08/02/20 1354       PT SHORT TERM GOAL #1   Title Patient will initiate Aquatic Therapy services (All STGS Due: 08/30/20)    Baseline have not yet participated in aquatic therapy services    Time 4    Period Weeks    Status New    Target Date 08/30/20               PT Long Term Goals - 08/02/20 1358       PT LONG TERM GOAL #1   Title Patient will report completing walking program >/= 20 minutes daily (ALL LTG Due: 08/02/20)    Baseline completing walking program only approx 10 minutes per day    Time 8    Period Weeks    Status Revised      PT LONG TERM GOAL #2   Title Patient will improve 5x sit <> stand to </= 18 secs with BUE support    Baseline 20.31 secs    Time 8    Period Weeks    Status Revised      PT LONG TERM GOAL #3   Title Patient will demo ability to ambulate >/= 67ft with improved upright posture and demonstrate scanning environment with ambulation    Baseline ambulation with increased flexed posture and decreased scanning environment    Time 8    Period Weeks    Status Revised      PT LONG TERM GOAL #4   Title  Patient will demo ability to stand with narrow BOS in pool for >/= 30 seconds (approx 4 foot depth) to demonstrate improved balance    Baseline TBA    Time 8    Period Weeks    Status Revised      PT LONG TERM GOAL #5   Title --    Baseline --  Time --    Period --    Status --                   Plan - 09/26/20 0930     Clinical Impression Statement Continued use of L Thuasne AFO with gait training and NMR today with no complaints of pain/discomfort. Pt continue to demo improvements in clearance with AFO donned, but does require verbal cues for improved hip/knee flexion. WIll continue to progress toward all LTGs.    Personal Factors and Comorbidities Comorbidity 3+    Comorbidities HTN, alcohol abuse (ongoing use), HLD, GERD, PUD, tobacco use    Examination-Activity Limitations Bed Mobility;Dressing;Locomotion Level;Stairs;Stand;Toileting;Transfers;Bathing    Examination-Participation Restrictions Driving;Interpersonal Relationship;Yard Work;Community Activity    Stability/Clinical Decision Making Evolving/Moderate complexity    Rehab Potential Good    PT Frequency 1x / week    PT Duration 8 weeks    PT Treatment/Interventions ADLs/Self Care Home Management;Electrical Stimulation;DME Instruction;Moist Heat;Cryotherapy;Gait training;Stair training;Functional mobility training;Therapeutic activities;Therapeutic exercise;Balance training;Neuromuscular re-education;Patient/family education;Orthotic Fit/Training;Wheelchair mobility training;Passive range of motion;Aquatic Therapy;Vestibular;Manual techniques;Dry needling;Joint Manipulations;Spinal Manipulations    PT Next Visit Plan Check goals + resumbit for 1/xweek for 6 weeks. Cotninue with use of L PLS Thuasne.  Work on standing balance working toward narrow BOS. standing weight shift activities    Consulted and Agree with Plan of Care Patient;Family member/caregiver    Family Member Consulted [name]              Patient will benefit from skilled therapeutic intervention in order to improve the following deficits and impairments:  Abnormal gait, Decreased coordination, Difficulty walking, Impaired tone, Decreased safety awareness, Decreased endurance, Decreased activity tolerance, Pain, Decreased balance, Decreased knowledge of use of DME, Postural dysfunction, Decreased strength, Decreased mobility  Visit Diagnosis: Other abnormalities of gait and mobility  Difficulty in walking, not elsewhere classified  Unsteadiness on feet  Muscle weakness (generalized)     Problem List Patient Active Problem List   Diagnosis Date Noted   H/O ETOH abuse 04/28/2020   History of tobacco abuse 01/07/2020   Hemiparesis affecting left side as late effect of stroke ([place]) 01/07/2020   Abnormality of gait 09/24/2019   AKI (acute kidney injury) ([place])    Fever    Sepsis without acute organ dysfunction (HCC)    Elevated BUN    Blood pressure increase diastolic    Labile blood pressure    Leukopenia    Benign essential HTN    Hypoalbuminemia    Cerebrovascular accident (CVA) of right basal ganglia ([place]) 07/07/2019   Acute ischemic stroke ([place])    Dyslipidemia    Dysphagia, post-stroke    Polycythemia    Stroke ([place]) 06/30/2019   Prediabetes 12/23/2016   Elevated LFTs 12/23/2016   Pure hypercholesterolemia 12/21/2016   Alcoholism ([place]) 12/21/2016   Eczema 09/18/2013   Essential hypertension 09/18/2013   Gastroesophageal reflux disease without esophagitis 09/18/2013   Tobacco abuse 09/18/2013    Jones Bales, PT, DPT 09/26/2020, 10:21 AM  [facility] [address] [place], [name], [address] Phone: [phone]   Fax:  [phone]  Name: Russell Russell MRN: [id number] Date of Birth: October 10, 1950

## 2020-09-26 NOTE — Telephone Encounter (Signed)
Pt c/o medication issue:  1. Name of Medication:  rivaroxaban (XARELTO) 20 MG TABS tablet  2. How are you currently taking this medication (dosage and times per day)? As prescribed   3. Are you having a reaction (difficulty breathing--STAT)? No   4. What is your medication issue? States medication has went up from $129 to $215 even with the special they have for it. Requesting an alternative medication be written instead due to this. Please advise.

## 2020-09-26 NOTE — Telephone Encounter (Signed)
Danella Sensing, NP,  Russell Russell is being treated by physical therapy for Hemiparesis affecting Left Side, Apraxia.  Mr. Krueger will benefit from use of Left AFO in order to improve safety with functional mobility. I am sending this to you as Dr. Posey Pronto is no longer following this patient, and the patient has a follow up visit scheduled with you on 10/21/20. During the face to face visit please document the need for an AFO.   If you agree, please submit request in EPIC under MD Order, Other Orders (list L AFO in comments) or fax to [facility] at [phone].   Thank you, Jones Bales, PT, [facility] [address] [place], [place]  [address] Phone:  [phone] Fax:  [phone]

## 2020-09-27 NOTE — Telephone Encounter (Signed)
Ms. Hassell Done,  Thank you very much, I will assess patient on 10/21/2020 and submit the order. Thank you for your help.

## 2020-09-30 ENCOUNTER — Other Ambulatory Visit: Payer: Self-pay

## 2020-09-30 ENCOUNTER — Telehealth: Payer: Self-pay | Admitting: Internal Medicine

## 2020-09-30 MED ORDER — RIVAROXABAN 20 MG PO TABS: 20.0000 mg | ORAL_TABLET | Freq: Every day | ORAL | 1 refills | Status: DC

## 2020-09-30 NOTE — Telephone Encounter (Signed)
Pt c/o medication issue:  1. Name of Medication: rivaroxaban (XARELTO) 20 MG TABS tablet  2. How are you currently taking this medication (dosage and times per day)? 20 mg once daily   3. Are you having a reaction (difficulty breathing--STAT)?   4. What is your medication issue? Wife states there was a mix-up and the patient's refill will not arrive from the mail order for 5-7 days. The patient is going to take his last dose on Sunday. She was requesting a short term supply sent to her local pharmacy    *STAT* If patient is at the pharmacy, call can be transferred to refill team.   1. Which medications need to be refilled? (please list name of each medication and dose if known)  rivaroxaban (XARELTO) 20 MG TABS tablet  2. Which pharmacy/location (including street and city if local pharmacy) is medication to be sent to? [id number] [place], [place] - [address]  3. Do they need a 30 day or 90 day supply? 7 days

## 2020-10-03 ENCOUNTER — Other Ambulatory Visit: Payer: Self-pay

## 2020-10-03 ENCOUNTER — Ambulatory Visit: Payer: Medicare HMO

## 2020-10-03 DIAGNOSIS — E8809 Other disorders of plasma-protein metabolism, not elsewhere classified: Secondary | ICD-10-CM | POA: Diagnosis not present

## 2020-10-03 DIAGNOSIS — R262 Difficulty in walking, not elsewhere classified: Secondary | ICD-10-CM | POA: Diagnosis not present

## 2020-10-03 DIAGNOSIS — M6281 Muscle weakness (generalized): Secondary | ICD-10-CM | POA: Diagnosis not present

## 2020-10-03 DIAGNOSIS — R2681 Unsteadiness on feet: Secondary | ICD-10-CM | POA: Diagnosis not present

## 2020-10-03 DIAGNOSIS — Z720 Tobacco use: Secondary | ICD-10-CM | POA: Diagnosis not present

## 2020-10-03 DIAGNOSIS — D72819 Decreased white blood cell count, unspecified: Secondary | ICD-10-CM | POA: Diagnosis not present

## 2020-10-03 DIAGNOSIS — R030 Elevated blood-pressure reading, without diagnosis of hypertension: Secondary | ICD-10-CM | POA: Diagnosis not present

## 2020-10-03 DIAGNOSIS — F1010 Alcohol abuse, uncomplicated: Secondary | ICD-10-CM | POA: Diagnosis not present

## 2020-10-03 DIAGNOSIS — R0989 Other specified symptoms and signs involving the circulatory and respiratory systems: Secondary | ICD-10-CM | POA: Diagnosis not present

## 2020-10-03 DIAGNOSIS — R509 Fever, unspecified: Secondary | ICD-10-CM | POA: Diagnosis not present

## 2020-10-03 DIAGNOSIS — R2689 Other abnormalities of gait and mobility: Secondary | ICD-10-CM | POA: Diagnosis not present

## 2020-10-03 DIAGNOSIS — A419 Sepsis, unspecified organism: Secondary | ICD-10-CM | POA: Diagnosis not present

## 2020-10-03 DIAGNOSIS — I639 Cerebral infarction, unspecified: Secondary | ICD-10-CM | POA: Diagnosis not present

## 2020-10-03 NOTE — Therapy (Signed)
[facility] [address] [place], [name], [address] Phone: [phone]   Fax:  [phone]  Physical Therapy Treatment/Re-Certification  Patient Details  Name: Russell Russell MRN: [id number] Date of Birth: [birth year] Referring Provider (PT): Jamse Arn, MD   Encounter Date: 10/03/2020   PT End of Session - 10/03/20 1020     Visit Number 16    Number of Visits 22    Date for PT Re-Evaluation 12/23/20   POC for 6 weeks, pushed out due to being on [employer] (medicare guidelines, 10th Visit PN)    Authorization Time Period awaiting new authorization;    Progress Note Due on Visit 20    PT Start Time 1017    PT Stop Time 1058    PT Time Calculation (min) 41 min    Equipment Utilized During Treatment Gait belt    Activity Tolerance Patient tolerated treatment well    Behavior During Therapy WFL for tasks assessed/performed             Past Medical History:  Diagnosis Date   Alcohol abuse    drinks 1 gallon of Gin every weekend, nothing during the week x >15 yrs   Elevated transaminase level    +elev bili: abd u/s 06/2014 showed stable small hepatic hemangiomas, o/w normal.   Essential hypertension    GERD (gastroesophageal reflux disease)    Hyperlipidemia 03/2014   Atorv started-chol improved   Impaired fasting glucose 03/2014   Nephrolithiasis    PUD (peptic ulcer disease)    Stroke ([place])    Tobacco dependence    chantix: "psych effects"    Past Surgical History:  Procedure Laterality Date   COLONOSCOPY  2005   Recall 10 yrs ([place])   LOOP RECORDER INSERTION N/A 07/03/2019   Procedure: LOOP RECORDER INSERTION;  Surgeon: Thompson Grayer, MD;  Location: [facility];  Service: Cardiovascular;  Laterality: N/A;   removal of kidney stones  2006   cystoscopic--removed from ureter.  No prob since.    There were no vitals filed for this visit.   Subjective  Assessment - 10/03/20 1021     Subjective No new changes complaints. No pain. No falls.    Patient is accompained by: Family member   spouse   Pertinent History PMH: HTN, alcohol abuse (ongoing use), HLD, GERD, PUD, tobacco use    Limitations Standing;Walking;House hold activities    Patient Stated Goals improve strength of L Leg; Be able to walk without dragging LLE    Currently in Pain? No/denies                [facility] PT Assessment - 10/03/20 0001       Assessment   Medical Diagnosis R Basal Ganglia CVA    Referring Provider (PT) Jamse Arn, MD              [facility] Adult PT Treatment/Exercise - 10/03/20 0001       Transfers   Transfers Sit to Stand;Stand to Sit    Sit to Stand 5: Supervision    Five time sit to stand comments  14.97 secs with BUE support on legs from mat    Stand to Sit 5: Supervision    Number of Reps 10 reps;1 set    Comments completed sit <> stand training with staggered stance (LLE Posterior) x 10 reps, PT blocking RLE from sliding back. Increased challenge/fatigue noted with staggered stance.  CGA      Ambulation/Gait   Ambulation/Gait Yes    Ambulation/Gait Assistance 5: Supervision    Ambulation/Gait Assistance Details Completed ambulation with L Thuasne AFO donned x 200 ft, with intermittent scanning of environment with horizontal head turns. Increased dchallenge noted with step length and foot placement with addition of dual task during ambulation. Supervision throughout.    Ambulation Distance (Feet) 200 Feet    Assistive device Rolling walker    Gait Pattern Step-through pattern;Step-to pattern;Decreased stride length;Decreased stance time - right;Decreased step length - left    Ambulation Surface Level;Indoor      Neuro Re-ed    Neuro Re-ed Details  Completed standing narrow BOS without UE support x 30 seconds. Supervision. Standing with slight narrow BOS completed standing with eyes open and diagonal lift/chop with 1# medicine ball x  10 reps each direction.            ]    PT Education - 10/03/20 1045     Education Details Progress toward LTG; Updated Walking Program; Placing PT on hold until can obtain order for AFO and speak with [employer]    Person(s) Educated Patient;Spouse    Methods Explanation    Comprehension Verbalized understanding              PT Short Term Goals - 08/02/20 1354       PT SHORT TERM GOAL #1   Title Patient will initiate Aquatic Therapy services (All STGS Due: 08/30/20)    Baseline have not yet participated in aquatic therapy services    Time 4    Period Weeks    Status New    Target Date 08/30/20              PT Long Term Goals - 10/03/20 1028       PT LONG TERM GOAL #1   Title Patient will report completing walking program >/= 20 minutes daily (ALL LTG Due: 08/02/20)    Baseline completing approx 10 minutes daily, everyday    Time 8    Period Weeks    Status Not Met      PT LONG TERM GOAL #2   Title Patient will improve 5x sit <> stand to </= 18 secs with BUE support    Baseline 20.31 secs; 14.97 secs    Time 8    Period Weeks    Status Achieved      PT LONG TERM GOAL #3   Title Patient will demo ability to ambulate >/= 46f with improved upright posture and demonstrate scanning environment with ambulation    Baseline ambulation with increased flexed posture and decreased scanning environment; able to scan environment with verbal cues continue to demo flexed posture with 200 ft and use of RW    Time 8    Period Weeks    Status Partially Met      PT LONG TERM GOAL #4   Title Patient will demo ability to stand with narrow BOS in pool for >/= 30 seconds (approx 4 foot depth) to demonstrate improved balance    Baseline TBA; able to stand narrow BOS for 30 seconds on land    Time 8    Period Weeks    Status Achieved            Updated Short Term Goals:   PT Short Term Goals - 10/03/20 1241       PT SHORT TERM GOAL #1   Title Patient will obtain AFO  and begin report understanding of wear schedule (All STGS Due: 11/25/20)    Baseline AFO not obtained    Time 3    Period Weeks    Status New    Target Date 08/30/20                Updated Long Term Goals:    PT Long Term Goals - 10/03/20 1242       PT LONG TERM GOAL #1   Title Patient will report completing walking program >/= 20 minutes daily (ALL LTG Due: 12/23/20)    Baseline completing approx 10 minutes daily, everyday    Time 6    Period Weeks    Status On-going      PT LONG TERM GOAL #2   Title Patient will improve 5x sit <> stand to </= 12 secs with BUE support    Baseline 20.31 secs; 14.97 secs    Time 6    Period Weeks    Status Revised      PT LONG TERM GOAL #3   Title Patient will demo ability to ambulate >/= 200 ft with RW on outdoors surface with AFO and improved upright posture    Baseline ambulation with increased flexed posture and decreased scanning environment; able to scan environment with verbal cues continue to demo flexed posture with 200 ft and use of RW    Time 6    Period Weeks    Status Revised      PT LONG TERM GOAL #4   Title Patient will report improved tolerance for wearing AFO all wake hours and report understanding of how to donn/doff device with assistance    Baseline AFO not obtained    Time 6    Period Weeks    Status New                  Plan - 10/03/20 1219     Clinical Impression Statement Assessed progress toward LTG today during session. patient able to meet LTG #2 and #4 demonstrating improved balance and reduced fall risk. Patient continue to be most limited with ambulation and posture during ambulation. Continued gait training with L Thuasne AFO and will begin process of obtaining order. Pt unable to have face to face appt until 8/12, therefore placing PT on hold at this time to allow. Will return to PT services when AFO has been recieved. Patient will continue to benefit from skilled PT services to promote  improved gait pattern and functional mobility.    Personal Factors and Comorbidities Comorbidity 3+    Comorbidities HTN, alcohol abuse (ongoing use), HLD, GERD, PUD, tobacco use    Examination-Activity Limitations Bed Mobility;Dressing;Locomotion Level;Stairs;Stand;Toileting;Transfers;Bathing    Examination-Participation Restrictions Driving;Interpersonal Relationship;Yard Work;Community Activity    Stability/Clinical Decision Making Evolving/Moderate complexity    Rehab Potential Good    PT Frequency 1x / week    PT Duration 8 weeks    PT Treatment/Interventions ADLs/Self Care Home Management;Electrical Stimulation;DME Instruction;Moist Heat;Cryotherapy;Gait training;Stair training;Functional mobility training;Therapeutic activities;Therapeutic exercise;Balance training;Neuromuscular re-education;Patient/family education;Orthotic Fit/Training;Wheelchair mobility training;Passive range of motion;Aquatic Therapy;Vestibular;Manual techniques;Dry needling;Joint Manipulations;Spinal Manipulations    PT Next Visit Plan Did we recieve brace? Continue with use of L PLS Thuasne.  Work on standing balance working toward narrow BOS. standing weight shift activities    Consulted and Agree with Plan of Care Patient;Family member/caregiver    Family Member Consulted [name]             Patient will benefit from skilled therapeutic  intervention in order to improve the following deficits and impairments:  Abnormal gait, Decreased coordination, Difficulty walking, Impaired tone, Decreased safety awareness, Decreased endurance, Decreased activity tolerance, Pain, Decreased balance, Decreased knowledge of use of DME, Postural dysfunction, Decreased strength, Decreased mobility  Visit Diagnosis: Other abnormalities of gait and mobility  Difficulty in walking, not elsewhere classified  Unsteadiness on feet  Muscle weakness (generalized)     Problem List Patient Active Problem List   Diagnosis  Date Noted   H/O ETOH abuse 04/28/2020   History of tobacco abuse 01/07/2020   Hemiparesis affecting left side as late effect of stroke ([place]) 01/07/2020   Abnormality of gait 09/24/2019   AKI (acute kidney injury) ([place])    Fever    Sepsis without acute organ dysfunction (H[facility])    Elevated BUN    Blood pressure increase diastolic    Labile blood pressure    Leukopenia    Benign essential HTN    Hypoalbuminemia    Cerebrovascular accident (CVA) of right basal ganglia ([place]) 07/07/2019   Acute ischemic stroke ([place])    Dyslipidemia    Dysphagia, post-stroke    Polycythemia    Stroke ([place]) 06/30/2019   Prediabetes 12/23/2016   Elevated LFTs 12/23/2016   Pure hypercholesterolemia 12/21/2016   Alcoholism ([place]) 12/21/2016   Eczema 09/18/2013   Essential hypertension 09/18/2013   Gastroesophageal reflux disease without esophagitis 09/18/2013   Tobacco abuse 09/18/2013    Jones Bales, PT, DPT 10/03/2020, 12:23 PM  [facility] [address] [place], [name], [address] Phone: [phone]   Fax:  [phone]  Name: Russell Russell MRN: [id number] Date of Birth: 31-Mar-1950

## 2020-10-03 NOTE — Patient Instructions (Signed)
[place], [place] [address] Phone: [phone] Fax: [phone]  HOURS Monday - Friday, 8 a.m. - 5 p.m.

## 2020-10-12 NOTE — Progress Notes (Signed)
[facility]link Summary Report / Loop Recorder 

## 2020-10-17 ENCOUNTER — Telehealth: Payer: Self-pay

## 2020-10-17 NOTE — Telephone Encounter (Signed)
Patient's wife is calling in stating that they got a reminder call that Cameo is overdue for a colonoscopy. Wanting to know the process to get this started.

## 2020-10-18 NOTE — Telephone Encounter (Signed)
Patient is scheduled   

## 2020-10-18 NOTE — Telephone Encounter (Signed)
Please call pt and schedule a CPE he is overdue and will discuss colonoscopy at visit.

## 2020-10-20 ENCOUNTER — Other Ambulatory Visit: Payer: Self-pay | Admitting: Physician Assistant

## 2020-10-21 ENCOUNTER — Encounter: Payer: Self-pay | Admitting: Registered Nurse

## 2020-10-21 ENCOUNTER — Other Ambulatory Visit: Payer: Self-pay

## 2020-10-21 ENCOUNTER — Telehealth: Payer: Self-pay | Admitting: Internal Medicine

## 2020-10-21 ENCOUNTER — Encounter: Payer: Medicare HMO | Attending: Physical Medicine & Rehabilitation | Admitting: Registered Nurse

## 2020-10-21 VITALS — BP 132/94 | HR 82 | Temp 98.3°F | Ht 70.0 in | Wt 190.0 lb

## 2020-10-21 DIAGNOSIS — I6381 Other cerebral infarction due to occlusion or stenosis of small artery: Secondary | ICD-10-CM | POA: Insufficient documentation

## 2020-10-21 DIAGNOSIS — I10 Essential (primary) hypertension: Secondary | ICD-10-CM | POA: Diagnosis not present

## 2020-10-21 DIAGNOSIS — I69354 Hemiplegia and hemiparesis following cerebral infarction affecting left non-dominant side: Secondary | ICD-10-CM | POA: Insufficient documentation

## 2020-10-21 DIAGNOSIS — R269 Unspecified abnormalities of gait and mobility: Secondary | ICD-10-CM

## 2020-10-21 NOTE — Telephone Encounter (Signed)
Pt c/o BP issue: STAT if pt c/o blurred vision, one-sided weakness or slurred speech  1. What are your last 5 BP readings? Top and bottom number are in triple digits  2. Are you having any other symptoms (ex. Dizziness, headache, blurred vision, passed out)? N/A  3. What is your BP issue? Experiencing high blood pressure; doctor from [facility] called and mentioned patient as STAT call

## 2020-10-21 NOTE — Telephone Encounter (Signed)
He is in a physical therapy session currently. BP 142/105 HR 89. No symptoms. No missed doses of medication. From last OV note it appears PCP had him on Lisinopril in the past and was discontinue related to low readings. Advised to call PCP to determine if they want to start him back on a BP medication because they managed this in the past. Verbalized agreement and understanding.

## 2020-10-21 NOTE — Progress Notes (Signed)
Subjective:    Patient ID: Russell Russell, male    DOB: Apr 16, 1950, 70 y.o.   MRN: [id number]  HPI: Russell Russell is a 70 y.o. male who returns for follow up appointment of his Left side hemiparesis secondary to multiple scattered anterior and posterior right brain infarcts. He denies any pain. He rates his pain 0. His current exercise regime is  walking with walker, his physical therapy on hold until he receives his Left AFO. Left AFO prescription given to Ms. Dalbert Russell, she verbalizes understanding.    Russell Russell arrived to office with uncontrolled hypertension, blood pressure was re-checked several times.   Ms. Ainsley called Dr Rayann Heman  ( Cardiology) office regarding his uncontrolled hypertension, they asked her to call his PCP.  Ms. Bertagnolli called his PCP and blood pressure readings reported. He has a F/U appointment with his PCP.  Russell Russell will continue to check his blood pressure readings and report to his PCP, she verbalizes understanding.   Robbin CMA checked Russell Russell blood pressure 134/96 P 86, prior to leaving our office.   Pain Inventory Average Pain 0 Pain Right Now 0 My pain is  no pain  In the last 24 hours, has pain interfered with the following? General activity 3 Relation with others  n/a Enjoyment of life 10 What TIME of day is your pain at its worst? varies Sleep (in general) Good  Pain is worse with:  n/a Pain improves with:  n/a no pain Relief from Meds: 0  Family History  Problem Relation Age of Onset   Colon cancer Mother    Cancer Father    Breast cancer Sister    Breast cancer Sister    Social History   Socioeconomic History   Marital status: Married    Spouse name: Not on file   Number of children: Not on file   Years of education: Not on file   Highest education level: Not on file  Occupational History   Not on file  Tobacco Use   Smoking status: Former    Packs/day: 0.50    Years: 20.00    Pack years: 10.00    Types: Cigarettes    Smokeless tobacco: Never  Vaping Use   Vaping Use: Never used  Substance and Sexual Activity   Alcohol use: Yes    Alcohol/week: 8.0 standard drinks    Types: 8 Cans of beer per week    Comment: Weekends only   Drug use: No   Sexual activity: Not on file  Other Topics Concern   Not on file  Social History Narrative   Married, no children.   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   Tob: 10 pack-yr hx (current as of 03/2014).   Alcohol: 8 beers and pint of whisky on weekends only.   Denies hx of prob with drugs or alcohol.   Social Determinants of Health   Financial Resource Strain: Not on file  Food Insecurity: Not on file  Transportation Needs: Not on file  Physical Activity: Not on file  Stress: Not on file  Social Connections: Not on file   Past Surgical History:  Procedure Laterality Date   COLONOSCOPY  2005   Recall 10 yrs ([place])   LOOP RECORDER INSERTION N/A 07/03/2019   Procedure: LOOP RECORDER INSERTION;  Surgeon: Thompson Grayer, MD;  Location: [facility];  Service: Cardiovascular;  Laterality: N/A;   removal of kidney stones  2006  cystoscopic--removed from ureter.  No prob since.   Past Surgical History:  Procedure Laterality Date   COLONOSCOPY  2005   Recall 10 yrs ([place])   LOOP RECORDER INSERTION N/A 07/03/2019   Procedure: LOOP RECORDER INSERTION;  Surgeon: Thompson Grayer, MD;  Location: [facility];  Service: Cardiovascular;  Laterality: N/A;   removal of kidney stones  2006   cystoscopic--removed from ureter.  No prob since.   Past Medical History:  Diagnosis Date   Alcohol abuse    drinks 1 gallon of Gin every weekend, nothing during the week x >15 yrs   Elevated transaminase level    +elev bili: abd u/s 06/2014 showed stable small hepatic hemangiomas, o/w normal.   Essential hypertension    GERD (gastroesophageal reflux disease)    Hyperlipidemia 03/2014   Atorv started-chol  improved   Impaired fasting glucose 03/2014   Nephrolithiasis    PUD (peptic ulcer disease)    Stroke ([place])    Tobacco dependence    chantix: "psych effects"   BP (!) 139/102 (BP Location: Right Arm)   Pulse 87   Temp 98.3 F (36.8 C) (Oral)   Ht '5\' 10"'$  (1.778 m)   Wt 190 lb (86.2 kg)   SpO2 97%   BMI 27.26 kg/m   Opioid Risk Score:   Fall Risk Score:  `1  Depression screen PHQ 2/9  Depression screen [facility] 2/9 07/26/2020 04/28/2020 01/07/2020 08/18/2019 08/17/2019  Decreased Interest 0 0 0 0 1  Down, Depressed, Hopeless 0 0 0 0 0  PHQ - 2 Score 0 0 0 0 1  Altered sleeping - - - - 0  Tired, decreased energy - - - - 0  Change in appetite - - - - 0  Feeling bad or failure about yourself  - - - - 0  Trouble concentrating - - - - 0  Moving slowly or fidgety/restless - - - - 0  Suicidal thoughts - - - - 0  PHQ-9 Score - - - - 1  Some recent data might be hidden      Review of Systems  Constitutional: Negative.   HENT: Negative.    Eyes: Negative.   Respiratory: Negative.    Cardiovascular: Negative.   Gastrointestinal: Negative.   Endocrine: Negative.   Genitourinary: Negative.   Musculoskeletal: Negative.   Skin: Negative.   Allergic/Immunologic: Negative.   Neurological: Negative.   Hematological: Negative.   Psychiatric/Behavioral: Negative.        Objective:   Physical Exam Vitals and nursing note reviewed.  Constitutional:      Appearance: Normal appearance.  Cardiovascular:     Rate and Rhythm: Normal rate and regular rhythm.     Pulses: Normal pulses.     Heart sounds: Normal heart sounds.  Pulmonary:     Effort: Pulmonary effort is normal.     Breath sounds: Normal breath sounds.  Musculoskeletal:     Cervical back: Normal range of motion and neck supple.     Comments: Normal Muscle Bulk and Muscle Testing Reveals:  Upper Extremities: Right: Full ROM and Muscle Strength 5/5 Left Upper Extremity: Decreased ROM 90 Degrees and Muscle Strength 4/5 Lower  Extremities: Full ROM and Muscle Strength 5/5 Arises from Table Slowly using walker for support     Skin:    General: Skin is warm and dry.  Neurological:     Mental Status: He is alert and oriented to person, place, and time.  Psychiatric:  Mood and Affect: Mood normal.        Behavior: Behavior normal.          Assessment & Plan:  1.  Left side hemiparesis secondary to multiple scattered anterior and posterior right brain infarcts.  Neurology Following. Continue Therapy with Neuro-Rehabilitation. RX: Left AFO today. Therapist note was reviewed.  2. Uncontrolled Hypertension: Russell Russell arrived to office with uncontrolled hypertension. Russell Russell called her Cardiologist, she was asked to call her PCP. Russell Russell called Mr. Dalbert Russell PCP, he has a scheduled appointment with PCP. Russell Russell will continue with blood pressure Journal and call PCP with Readings. She verbalizes understanding.  3.  History of tobacco and alcohol use.: Educated on Smoking Cessation and he verbalizes understanding.    4. Gait abnormality: Continue Outpatient Therapy: Continue with [facility] for Safety.                         Patient prefers to return with PM&R for checking: Every 3 months

## 2020-10-24 ENCOUNTER — Ambulatory Visit (INDEPENDENT_AMBULATORY_CARE_PROVIDER_SITE_OTHER): Payer: Medicare HMO

## 2020-10-24 DIAGNOSIS — I6340 Cerebral infarction due to embolism of unspecified cerebral artery: Secondary | ICD-10-CM | POA: Diagnosis not present

## 2020-10-25 ENCOUNTER — Ambulatory Visit (INDEPENDENT_AMBULATORY_CARE_PROVIDER_SITE_OTHER): Payer: Medicare HMO | Admitting: Physician Assistant

## 2020-10-25 ENCOUNTER — Other Ambulatory Visit: Payer: Self-pay

## 2020-10-25 ENCOUNTER — Encounter: Payer: Self-pay | Admitting: Physician Assistant

## 2020-10-25 VITALS — BP 120/90 | HR 83 | Temp 97.8°F | Ht 70.0 in | Wt 188.2 lb

## 2020-10-25 DIAGNOSIS — I6340 Cerebral infarction due to embolism of unspecified cerebral artery: Secondary | ICD-10-CM

## 2020-10-25 DIAGNOSIS — Z0000 Encounter for general adult medical examination without abnormal findings: Secondary | ICD-10-CM | POA: Diagnosis not present

## 2020-10-25 DIAGNOSIS — R7303 Prediabetes: Secondary | ICD-10-CM | POA: Diagnosis not present

## 2020-10-25 DIAGNOSIS — Z1211 Encounter for screening for malignant neoplasm of colon: Secondary | ICD-10-CM

## 2020-10-25 DIAGNOSIS — I10 Essential (primary) hypertension: Secondary | ICD-10-CM

## 2020-10-25 DIAGNOSIS — Z125 Encounter for screening for malignant neoplasm of prostate: Secondary | ICD-10-CM | POA: Diagnosis not present

## 2020-10-25 LAB — LIPID PANEL
Cholesterol: 130 mg/dL (ref 0–200)
HDL: 41.5 mg/dL (ref >39.00)
LDL Cholesterol: 64 mg/dL (ref 0–99)
NonHDL: 88.22
Total CHOL/HDL Ratio: 3
Triglycerides: 122 mg/dL (ref 0.0–149.0)
VLDL: 24.4 mg/dL (ref 0.0–40.0)

## 2020-10-25 LAB — CBC WITH DIFFERENTIAL/PLATELET
Basophils Absolute: 0 10*3/uL (ref 0.0–0.1)
Basophils Relative: 0.5 % (ref 0.0–3.0)
Eosinophils Absolute: 0.2 10*3/uL (ref 0.0–0.7)
Eosinophils Relative: 3.4 % (ref 0.0–5.0)
HCT: 43.1 % (ref 39.0–52.0)
Hemoglobin: 14.1 g/dL (ref 13.0–17.0)
Lymphocytes Relative: 34.5 % (ref 12.0–46.0)
Lymphs Abs: 2 10*3/uL (ref 0.7–4.0)
MCHC: 32.7 g/dL (ref 30.0–36.0)
MCV: 92.8 fl (ref 78.0–100.0)
Monocytes Absolute: 0.6 10*3/uL (ref 0.1–1.0)
Monocytes Relative: 9.7 % (ref 3.0–12.0)
Neutro Abs: 3 10*3/uL (ref 1.4–7.7)
Neutrophils Relative %: 51.9 % (ref 43.0–77.0)
Platelets: 246 10*3/uL (ref 150.0–400.0)
RBC: 4.64 Mil/uL (ref 4.22–5.81)
RDW: 15.8 % — ABNORMAL HIGH (ref 11.5–15.5)
WBC: 5.8 10*3/uL (ref 4.0–10.5)

## 2020-10-25 LAB — COMPREHENSIVE METABOLIC PANEL WITH GFR
ALT: 41 U/L (ref 0–53)
AST: 38 U/L — ABNORMAL HIGH (ref 0–37)
Albumin: 3.9 g/dL (ref 3.5–5.2)
Alkaline Phosphatase: 115 U/L (ref 39–117)
BUN: 13 mg/dL (ref 6–23)
CO2: 27 meq/L (ref 19–32)
Calcium: 9.9 mg/dL (ref 8.4–10.5)
Chloride: 108 meq/L (ref 96–112)
Creatinine, Ser: 1.28 mg/dL (ref 0.40–1.50)
GFR: 57.37 mL/min — ABNORMAL LOW
Glucose, Bld: 74 mg/dL (ref 70–99)
Potassium: 5 meq/L (ref 3.5–5.1)
Sodium: 141 meq/L (ref 135–145)
Total Bilirubin: 0.7 mg/dL (ref 0.2–1.2)
Total Protein: 7.2 g/dL (ref 6.0–8.3)

## 2020-10-25 LAB — PSA: PSA: 0.89 ng/mL (ref 0.10–4.00)

## 2020-10-25 LAB — CUP PACEART REMOTE DEVICE CHECK
Date Time Interrogation Session: 20220807230406
Implantable Pulse Generator Implant Date: 20210423

## 2020-10-25 LAB — HEMOGLOBIN A1C: Hgb A1c MFr Bld: 6.4 % (ref 4.6–6.5)

## 2020-10-25 NOTE — Patient Instructions (Signed)
It was great to see you!  *Please continue to monitor your blood pressure, call us if concerns *Please complete colon cancer screening when it comes to the house *Please get vaccinated for COVID at your local pharmacy  Please go to the lab for blood work.   Our office will call you with your results unless you have chosen to receive results via MyChart.  If your blood work is normal we will follow-up each year for physicals and as scheduled for chronic medical problems.  If anything is abnormal we will treat accordingly and get you in for a follow-up.  Take care,  Aldona Bar

## 2020-10-25 NOTE — Progress Notes (Signed)
I acted as a Education administrator for [employer], PA-C Anselmo Pickler, LPN   Subjective:    Russell Russell is a 70 y.o. male and is here for a comprehensive physical exam.   HPI  Health Maintenance Due  Topic Date Due   COLONOSCOPY (Pts 45-28yr Insurance coverage will need to be confirmed)  03/14/2013    Acute Concerns: None  Chronic Issues: Stroke -- compliant with neurology follow-up. Last saw JFrann RiderNP 06/28/20.  Remains compliant with xarelto 20 mg daily and lipitor 40 mg daily. HTN -- Currently taking lopressor 25 mg. At home blood pressure readings are: WNL. Patient denies chest pain, SOB, blurred vision, dizziness, unusual headaches, lower leg swelling. Patient is compliant with medication. Denies excessive caffeine intake, stimulant usage, excessive alcohol intake, or increase in salt consumption.  BP Readings from Last 3 Encounters:  10/25/20 120/90  10/21/20 (!) 132/94  07/26/20 131/89   Prediabetes -- Most recent A1c elevated. Continues to gain weight, spends a lot of his time at home, eating. Does drink very little water and is mostly drinking gatorade and pink lemonade.  Lab Results  Component Value Date   HGBA1C 6.2 (H) 06/29/2020      Health Maintenance: Immunizations -- needs tetanus, shingrix needs to be done at pharmacy. Pt and wife verbalized understanding. Declines COVID vaccines. Colonoscopy -- overdue PSA -- none Diet -- overall well balanced; drinks gatorade and pink lemonade Caffeine intake -- none Sleep habits -- sleeps okay, no concerns for sleep apnea Exercise -- walks with walker; planning to return to PT after AFO made Weight -- Weight: 188 lb 4 oz (85.4 kg)  Recent weight history Wt Readings from Last 10 Encounters:  10/25/20 188 lb 4 oz (85.4 kg)  10/21/20 190 lb (86.2 kg)  07/26/20 184 lb (83.5 kg)  06/28/20 186 lb (84.4 kg)  04/28/20 187 lb 6.4 oz (85 kg)  01/27/20 164 lb (74.4 kg)  01/07/20 168 lb (76.2 kg)  12/29/19 168 lb  (76.2 kg)  11/26/19 160 lb (72.6 kg)  09/24/19 159 lb (72.1 kg)   Body mass index is 27.01 kg/m. Mood -- no concerns Alcohol use --  reports current alcohol use of about 8.0 standard drinks per week.  Tobacco use --  Tobacco Use: High Risk   Smoking Tobacco Use: Every Day   Smokeless Tobacco Use: Never     [facility]  Decreased Interest 0  Down, Depressed, Hopeless 0  PHQ - 2 Score 0  Altered sleeping -  Tired, decreased energy -  Change in appetite -  Feeling bad or failure about yourself  -  Trouble concentrating -  Moving slowly or fidgety/restless -  Suicidal thoughts -  PHQ-9 Score -  Some recent data might be hidden   UTD with eye doctor  Does not see dentist Other providers/specialists: Patient Care Team: WInda Coke PUtahas PCP - General (Physician Assistant)    PMHx, SurgHx, SocialHx, Medications, and Allergies were reviewed in the Visit Navigator and updated as appropriate.   Past Medical History:  Diagnosis Date   Alcohol abuse    drinks 1 gallon of Gin every weekend, nothing during the week x >15 yrs   Elevated transaminase level    +elev bili: abd u/s 06/2014 showed stable small hepatic hemangiomas, o/w normal.   Essential hypertension    GERD (gastroesophageal reflux disease)    Hyperlipidemia 03/2014   Atorv started-chol improved   Impaired fasting glucose 03/2014   Nephrolithiasis  PUD (peptic ulcer disease)    Stroke ([place])    Tobacco dependence    chantix: "psych effects"     Past Surgical History:  Procedure Laterality Date   COLONOSCOPY  2005   Recall 10 yrs ([place])   LOOP RECORDER INSERTION N/A 07/03/2019   Procedure: LOOP RECORDER INSERTION;  Surgeon: Thompson Grayer, MD;  Location: [facility];  Service: Cardiovascular;  Laterality: N/A;   removal of kidney stones  2006   cystoscopic--removed from ureter.  No prob since.     Family History  Problem Relation Age of Onset   Colon cancer  Mother    Cancer Father    Breast cancer Sister    Breast cancer Sister     Social History   Tobacco Use   Smoking status: Every Day    Packs/day: 0.50    Years: 20.00    Pack years: 10.00    Types: Cigarettes   Smokeless tobacco: Never  Vaping Use   Vaping Use: Never used  Substance Use Topics   Alcohol use: Yes    Alcohol/week: 8.0 standard drinks    Types: 8 Cans of beer per week    Comment: Weekends only   Drug use: No    Review of Systems:   Review of Systems  Constitutional:  Negative for chills, fever, malaise/fatigue and weight loss.  HENT:  Negative for hearing loss, sinus pain and sore throat.   Respiratory:  Negative for cough and hemoptysis.   Cardiovascular:  Negative for chest pain, palpitations, leg swelling and PND.  Gastrointestinal:  Negative for abdominal pain, constipation, diarrhea, heartburn, nausea and vomiting.  Genitourinary:  Negative for dysuria, frequency and urgency.  Musculoskeletal:  Negative for back pain, myalgias and neck pain.  Skin:  Negative for itching and rash.  Neurological:  Negative for dizziness, tingling, seizures and headaches.  Endo/Heme/Allergies:  Negative for polydipsia.  Psychiatric/Behavioral:  Negative for depression. The patient is not nervous/anxious.    Objective:    Vitals:   10/25/20 1058  BP: 120/90  Pulse: 83  Temp: 97.8 F (36.6 C)  SpO2: 100%   Body mass index is 27.01 kg/m.  General  Alert, cooperative, no distress, appears stated age  Head:  Normocephalic, without obvious abnormality, atraumatic  Eyes:  PERRL, conjunctiva/corneas clear, EOM's intact, fundi benign, both eyes       Ears:  Normal TM's and external ear canals, both ears  Nose: Nares normal, septum midline, mucosa normal, no drainage or sinus tenderness  Throat: Lips, mucosa, and tongue normal; teeth and gums normal  Neck: Supple, symmetrical, trachea midline, no adenopathy;     thyroid:  No enlargement/tenderness/nodules; no  carotid bruit or JVD  Back:   Symmetric, no curvature, ROM normal, no CVA tenderness  Lungs:   Clear to auscultation bilaterally, respirations unlabored  Chest wall:  No tenderness or deformity  Heart:  Regular rate and rhythm, S1 and S2 normal, no murmur, rub or gallop  Abdomen:   Soft, non-tender, bowel sounds active all four quadrants, no masses, no organomegaly  Extremities: Extremities normal, atraumatic, no cyanosis or edema  Prostate : Deferred   Skin: Skin color, texture, turgor normal, no rashes or lesions  Lymph nodes: Cervical, supraclavicular, and axillary nodes normal  Neurologic: CNII-XII grossly intact. Normal strength, sensation and reflexes throughout   AssessmentPlan:   Russell Russell was seen today for annual exam.  Diagnoses and all orders for this visit:  Routine physical examination Today patient counseled on age  appropriate routine health concerns for screening and prevention, each reviewed and up to date or declined. Immunizations reviewed and up to date or declined. Labs ordered and reviewed. Risk factors for depression reviewed and negative. Hearing function and visual acuity are intact. ADLs screened and addressed as needed. Functional ability and level of safety reviewed and appropriate. Education, counseling and referrals performed based on assessed risks today. Patient provided with a copy of personalized plan for preventive services.  Prostate cancer screening -     PSA  Cerebrovascular accident (CVA) due to embolism of cerebral artery ([place]) Compliant with follow-up. Continue xarelto 20 mg daily. Will adjust lipitor 40 mg prn based on results for goal LDL <70. -     CBC with Differential/Platelet -     Comprehensive metabolic panel -     Lipid panel  Essential hypertension Normotensive in office today Continue lopressor 25 mg daily Continue to monitor for goal 130/90 or lower -     CBC with Differential/Platelet -     Comprehensive metabolic  panel  Special screening for malignant neoplasms, colon -     Cologuard  Prediabetes Work on sugar beverage reduction. -     Hemoglobin A1c   Patient Counseling: '[x]'$   Nutrition: Stressed importance of moderation in sodium/caffeine intake, saturated fat and cholesterol, caloric balance, sufficient intake of fresh fruits, vegetables, and fiber  '[x]'$   Stressed the importance of regular exercise.   '[]'$   Substance Abuse: Discussed cessation/primary prevention of tobacco, alcohol, or other drug use; driving or other dangerous activities under the influence; availability of treatment for abuse.   '[x]'$   Injury prevention: Discussed safety belts, safety helmets, smoke detector, smoking near bedding or upholstery.   '[]'$   Sexuality: Discussed sexually transmitted diseases, partner selection, use of condoms, avoidance of unintended pregnancy  and contraceptive alternatives.   '[x]'$   Dental health: Discussed importance of regular tooth brushing, flossing, and dental visits.  '[x]'$   Health maintenance and immunizations reviewed. Please refer to Health maintenance section.   CMA or LPN served as scribe during this visit. History, Physical, and Plan performed by medical provider. The above documentation has been reviewed and is accurate and complete.  Inda Coke, PA-C [place]

## 2020-11-03 DIAGNOSIS — A419 Sepsis, unspecified organism: Secondary | ICD-10-CM | POA: Diagnosis not present

## 2020-11-03 DIAGNOSIS — D72819 Decreased white blood cell count, unspecified: Secondary | ICD-10-CM | POA: Diagnosis not present

## 2020-11-03 DIAGNOSIS — F1010 Alcohol abuse, uncomplicated: Secondary | ICD-10-CM | POA: Diagnosis not present

## 2020-11-03 DIAGNOSIS — R0989 Other specified symptoms and signs involving the circulatory and respiratory systems: Secondary | ICD-10-CM | POA: Diagnosis not present

## 2020-11-03 DIAGNOSIS — E8809 Other disorders of plasma-protein metabolism, not elsewhere classified: Secondary | ICD-10-CM | POA: Diagnosis not present

## 2020-11-03 DIAGNOSIS — Z720 Tobacco use: Secondary | ICD-10-CM | POA: Diagnosis not present

## 2020-11-03 DIAGNOSIS — I639 Cerebral infarction, unspecified: Secondary | ICD-10-CM | POA: Diagnosis not present

## 2020-11-03 DIAGNOSIS — R030 Elevated blood-pressure reading, without diagnosis of hypertension: Secondary | ICD-10-CM | POA: Diagnosis not present

## 2020-11-03 DIAGNOSIS — R509 Fever, unspecified: Secondary | ICD-10-CM | POA: Diagnosis not present

## 2020-11-08 ENCOUNTER — Ambulatory Visit: Payer: Medicare HMO | Attending: Physician Assistant

## 2020-11-08 ENCOUNTER — Other Ambulatory Visit: Payer: Self-pay

## 2020-11-08 DIAGNOSIS — R262 Difficulty in walking, not elsewhere classified: Secondary | ICD-10-CM | POA: Insufficient documentation

## 2020-11-08 DIAGNOSIS — R2689 Other abnormalities of gait and mobility: Secondary | ICD-10-CM | POA: Insufficient documentation

## 2020-11-08 DIAGNOSIS — R2681 Unsteadiness on feet: Secondary | ICD-10-CM

## 2020-11-08 DIAGNOSIS — M6281 Muscle weakness (generalized): Secondary | ICD-10-CM

## 2020-11-08 NOTE — Therapy (Signed)
[facility] [address] [place], [name], [address] Phone: [phone]   Fax:  [phone]  Physical Therapy Treatment  Patient Details  Name: Russell Russell MRN: [id number] Date of Birth: [birth year] Referring Provider (PT): Jamse Arn, MD   Encounter Date: 11/08/2020   PT End of Session - 11/08/20 1146     Visit Number 17    Number of Visits 22    Date for PT Re-Evaluation 12/23/20   POC for 6 weeks, pushed out due to being on [place] Medicare (medicare guidelines, 10th Visit PN)    Authorization Time Period awaiting new authorization;    Progress Note Due on Visit 20    PT Start Time 1146    PT Stop Time 1230    PT Time Calculation (min) 44 min    Equipment Utilized During Treatment Gait belt    Activity Tolerance Patient tolerated treatment well    Behavior During Therapy WFL for tasks assessed/performed             Past Medical History:  Diagnosis Date   Alcohol abuse    drinks 1 gallon of Gin every weekend, nothing during the week x >15 yrs   Elevated transaminase level    +elev bili: abd u/s 06/2014 showed stable small hepatic hemangiomas, o/w normal.   Essential hypertension    GERD (gastroesophageal reflux disease)    Hyperlipidemia 03/2014   Atorv started-chol improved   Impaired fasting glucose 03/2014   Nephrolithiasis    PUD (peptic ulcer disease)    Stroke ([place])    Tobacco dependence    chantix: "psych effects"    Past Surgical History:  Procedure Laterality Date   COLONOSCOPY  2005   Recall 10 yrs ([place])   LOOP RECORDER INSERTION N/A 07/03/2019   Procedure: LOOP RECORDER INSERTION;  Surgeon: Thompson Grayer, MD;  Location: [facility];  Service: Cardiovascular;  Laterality: N/A;   removal of kidney stones  2006   cystoscopic--removed from ureter.  No prob since.    There were no vitals filed for this visit.   Subjective Assessment -  11/08/20 1149     Subjective Is settled in new house. Had appt with [facility] and will pick up the brace September 12th. No falls. No pain. Asking about trialing a cane    Patient is accompained by: Family member   spouse   Pertinent History PMH: HTN, alcohol abuse (ongoing use), HLD, GERD, PUD, tobacco use    Limitations Standing;Walking;House hold activities    Patient Stated Goals improve strength of L Leg; Be able to walk without dragging LLE    Currently in Pain? No/denies                [facility] Adult PT Treatment/Exercise - 11/08/20 0001       Transfers   Transfers Sit to Stand;Stand to Sit    Sit to Stand 5: Supervision    Stand to Sit 5: Supervision      Ambulation/Gait   Ambulation/Gait Yes    Ambulation/Gait Assistance 5: Supervision;4: Min guard    Ambulation/Gait Assistance Details Completed ambulation with RW x 115 ft with L PLS [place] donned working on improved hip/knee flexion and "kicking" LLE to clear foot. Then trialed SBQC 2 x 115 ft. PT providing cues initially for sequencing as well as step length. Patienta ble to complete with intermittent supervision/CGA throughout. Increased challenge with fatigue with longer distance. Also  more challenge noted in crowded areas of gym due to motor planning impairment.    Ambulation Distance (Feet) 115 Feet   x 3   Assistive device Rolling walker;Small based quad cane    Gait Pattern Step-through pattern;Step-to pattern;Decreased stride length;Decreased stance time - right;Decreased step length - left    Ambulation Surface Level;Indoor    Gait Comments Donned L PLS [place].      Neuro Re-ed    Neuro Re-ed Details  In tall kneeling position on mat with bench positioned in front of patient; completed maintaining neutral position and hip extension, completed alternating shoulder flexion x 10 reps bilaterally. then worked on maintaining hip extension without UE support 3 x 20-30 seconds. Compelted hip hinges working on equal  weight bearing 2 x 10 reps, with PT providing intermittent faciliation for improved weight shift to L.               PT Short Term Goals - 10/03/20 1241       PT SHORT TERM GOAL #1   Title Patient will obtain AFO and begin report understanding of wear schedule (All STGS Due: 11/25/20)    Baseline AFO not obtained    Time 3    Period Weeks    Status New    Target Date 08/30/20               PT Long Term Goals - 10/03/20 1242       PT LONG TERM GOAL #1   Title Patient will report completing walking program >/= 20 minutes daily (ALL LTG Due: 12/23/20)    Baseline completing approx 10 minutes daily, everyday    Time 6    Period Weeks    Status On-going    Target Date 12/23/20      PT LONG TERM GOAL #2   Title Patient will improve 5x sit <> stand to </= 12 secs with BUE support    Baseline 20.31 secs; 14.97 secs    Time 6    Period Weeks    Status Revised      PT LONG TERM GOAL #3   Title Patient will demo ability to ambulate >/= 200 ft with RW on outdoors surface with AFO and improved upright posture    Baseline ambulation with increased flexed posture and decreased scanning environment; able to scan environment with verbal cues continue to demo flexed posture with 200 ft and use of RW    Time 6    Period Weeks    Status Revised      PT LONG TERM GOAL #4   Title Patient will report improved tolerance for wearing AFO all wake hours and report understanding of how to donn/doff device with assistance    Baseline AFO not obtained    Time 6    Period Weeks    Status New                   Plan - 11/08/20 1243     Clinical Impression Statement Patient returns to PT services afteer being on hold to work on obtaining personal AFO. Will be recieving in approx two weeks from [facility]. Trialed SBQC with improved balance and use of AD compared to prior use. Patient able to ambulate x 115 ft with CGA/supervision, but increased chalenge with fatigue and  croweded environment. Continued NMR working on improved proximal hip stability and weight shift to L. Will continue to progress toward all LTGs.    Personal Factors  and Comorbidities Comorbidity 3+    Comorbidities HTN, alcohol abuse (ongoing use), HLD, GERD, PUD, tobacco use    Examination-Activity Limitations Bed Mobility;Dressing;Locomotion Level;Stairs;Stand;Toileting;Transfers;Bathing    Examination-Participation Restrictions Driving;Interpersonal Relationship;Yard Work;Community Activity    Stability/Clinical Decision Making Evolving/Moderate complexity    Rehab Potential Good    PT Frequency 1x / week    PT Duration 8 weeks    PT Treatment/Interventions ADLs/Self Care Home Management;Electrical Stimulation;DME Instruction;Moist Heat;Cryotherapy;Gait training;Stair training;Functional mobility training;Therapeutic activities;Therapeutic exercise;Balance training;Neuromuscular re-education;Patient/family education;Orthotic Fit/Training;Wheelchair mobility training;Passive range of motion;Aquatic Therapy;Vestibular;Manual techniques;Dry needling;Joint Manipulations;Spinal Manipulations    PT Next Visit Plan Continue with use of L PLS [place] Continue practicing with [facility], turns and negotiating around obstacles.  Work on standing balance working toward narrow BOS. standing weight shift activities    Consulted and Agree with Plan of Care Patient;Family member/caregiver    Family Member Consulted [name]             Patient will benefit from skilled therapeutic intervention in order to improve the following deficits and impairments:  Abnormal gait, Decreased coordination, Difficulty walking, Impaired tone, Decreased safety awareness, Decreased endurance, Decreased activity tolerance, Pain, Decreased balance, Decreased knowledge of use of DME, Postural dysfunction, Decreased strength, Decreased mobility  Visit Diagnosis: Other abnormalities of gait and mobility  Unsteadiness on  feet  Difficulty in walking, not elsewhere classified  Muscle weakness (generalized)     Problem List Patient Active Problem List   Diagnosis Date Noted   H/O ETOH abuse 04/28/2020   History of tobacco abuse 01/07/2020   Hemiparesis affecting left side as late effect of stroke ([place]) 01/07/2020   Abnormality of gait 09/24/2019   AKI (acute kidney injury) ([place])    Fever    Sepsis without acute organ dysfunction ([facility])    Elevated BUN    Blood pressure increase diastolic    Labile blood pressure    Leukopenia    Benign essential HTN    Hypoalbuminemia    Cerebrovascular accident (CVA) of right basal ganglia ([place]) 07/07/2019   Acute ischemic stroke ([place])    Dyslipidemia    Dysphagia, post-stroke    Polycythemia    Stroke ([place]) 06/30/2019   Prediabetes 12/23/2016   Elevated LFTs 12/23/2016   Pure hypercholesterolemia 12/21/2016   Alcoholism ([place]) 12/21/2016   Eczema 09/18/2013   Essential hypertension 09/18/2013   Gastroesophageal reflux disease without esophagitis 09/18/2013   Tobacco abuse 09/18/2013    Jones Bales, PT, DPT 11/08/2020, 12:46 PM  [facility] [address] [place], [name], [address] Phone: [phone]   Fax:  [phone]  Name: Russell Russell MRN: [id number] Date of Birth: 17-Jun-1950

## 2020-11-11 NOTE — Progress Notes (Signed)
[facility]link Summary Report / Loop Recorder 

## 2020-11-15 ENCOUNTER — Other Ambulatory Visit: Payer: Self-pay

## 2020-11-15 ENCOUNTER — Ambulatory Visit: Payer: Medicare HMO | Attending: Physician Assistant | Admitting: Physical Therapy

## 2020-11-15 ENCOUNTER — Encounter: Payer: Self-pay | Admitting: Physical Therapy

## 2020-11-15 DIAGNOSIS — R262 Difficulty in walking, not elsewhere classified: Secondary | ICD-10-CM | POA: Diagnosis not present

## 2020-11-15 DIAGNOSIS — M6281 Muscle weakness (generalized): Secondary | ICD-10-CM | POA: Diagnosis not present

## 2020-11-15 DIAGNOSIS — R2681 Unsteadiness on feet: Secondary | ICD-10-CM

## 2020-11-15 DIAGNOSIS — R2689 Other abnormalities of gait and mobility: Secondary | ICD-10-CM | POA: Diagnosis not present

## 2020-11-15 NOTE — Therapy (Signed)
[facility] [address] [place], [name], [address] Phone: [phone]   Fax:  [phone]  Physical Therapy Treatment  Patient Details  Name: Russell Russell MRN: [id number] Date of Birth: [birth year] Referring Provider (PT): Jamse Arn, MD   Encounter Date: 11/15/2020   PT End of Session - 11/15/20 1345     Visit Number 18    Number of Visits 22    Date for PT Re-Evaluation 12/23/20   POC for 6 weeks, pushed out due to being on [place] Medicare (medicare guidelines, 10th Visit PN)    Authorization Time Period awaiting new authorization;    Progress Note Due on Visit 20    PT Start Time 1150    PT Stop Time 1231    PT Time Calculation (min) 41 min    Equipment Utilized During Treatment Gait belt    Activity Tolerance Patient tolerated treatment well    Behavior During Therapy WFL for tasks assessed/performed             Past Medical History:  Diagnosis Date   Alcohol abuse    drinks 1 gallon of Gin every weekend, nothing during the week x >15 yrs   Elevated transaminase level    +elev bili: abd u/s 06/2014 showed stable small hepatic hemangiomas, o/w normal.   Essential hypertension    GERD (gastroesophageal reflux disease)    Hyperlipidemia 03/2014   Atorv started-chol improved   Impaired fasting glucose 03/2014   Nephrolithiasis    PUD (peptic ulcer disease)    Stroke ([place])    Tobacco dependence    chantix: "psych effects"    Past Surgical History:  Procedure Laterality Date   COLONOSCOPY  2005   Recall 10 yrs ([place])   LOOP RECORDER INSERTION N/A 07/03/2019   Procedure: LOOP RECORDER INSERTION;  Surgeon: Thompson Grayer, MD;  Location: [facility];  Service: Cardiovascular;  Laterality: N/A;   removal of kidney stones  2006   cystoscopic--removed from ureter.  No prob since.    There were no vitals filed for this visit.   Subjective Assessment -  11/15/20 1153     Subjective Will be going to [facility] next week to get brace on the 12th.    Patient is accompained by: Family member   spouse   Pertinent History PMH: HTN, alcohol abuse (ongoing use), HLD, GERD, PUD, tobacco use    Limitations Standing;Walking;House hold activities    Patient Stated Goals improve strength of L Leg; Be able to walk without dragging LLE    Currently in Pain? No/denies                               [facility] Adult PT Treatment/Exercise - 11/15/20 1202       Transfers   Transfers Sit to Stand;Stand to Sit    Sit to Stand 5: Supervision    Stand to Sit 5: Supervision    Number of Reps 1 set;10 reps    Comments performed sit <> stands from mat table with RLE posteriorly for incr LLE weight bearing, cued for incr forward lean in standing, use of mirror as visual cue and therapist providing manual cue to maintain LLE weight shift when sitting      Ambulation/Gait   Ambulation/Gait Yes    Ambulation/Gait Assistance 5: Supervision;4: Min guard;4: Min assist    Ambulation/Gait Assistance Details completed  ambulation with RW and L thuasne PLS AFO - cued throughout for posture and big kick with LLE for incr foot clearance. performed 77' x2 with [facility], cues for posture and sequencing. pt's LLE more fatigued during end of 2nd lap and needing more min A for balance    Ambulation Distance (Feet) 115 Feet   x1, 115 x 2   Assistive device Rolling walker;Small based quad cane    Gait Pattern Step-through pattern;Step-to pattern;Decreased stride length;Decreased stance time - right;Decreased step length - left    Ambulation Surface Level;Indoor    Gait Comments Donned L PLS [place].                 Balance Exercises - 11/15/20 1231       Balance Exercises: Standing   Standing Eyes Opened Narrow base of support (BOS)    Standing Eyes Opened Limitations 2 x 30 seconds with feet together, cued for posture and looking ahead in mirror    Standing,  One Foot on a Step Eyes open;Head turns;4 inch;Limitations    Standing, One Foot on a Step Limitations with RLE on step for stance on LLE, cued for posture looking uprught into mirror and glute quad activation on LLE, 3 x 10 seconds holds with no UE support, then performed x5 reps head turns,  x5 reps head nods with fingertip support for balance    Other Standing Exercises with BUE support: LLE hip flexion onto 4" step and back to floor x10 reps, cues for posture                 PT Short Term Goals - 10/03/20 1241       PT SHORT TERM GOAL #1   Title Patient will obtain AFO and begin report understanding of wear schedule (All STGS Due: 11/25/20)    Baseline AFO not obtained    Time 3    Period Weeks    Status New    Target Date 08/30/20               PT Long Term Goals - 10/03/20 1242       PT LONG TERM GOAL #1   Title Patient will report completing walking program >/= 20 minutes daily (ALL LTG Due: 12/23/20)    Baseline completing approx 10 minutes daily, everyday    Time 6    Period Weeks    Status On-going    Target Date 12/23/20      PT LONG TERM GOAL #2   Title Patient will improve 5x sit <> stand to </= 12 secs with BUE support    Baseline 20.31 secs; 14.97 secs    Time 6    Period Weeks    Status Revised      PT LONG TERM GOAL #3   Title Patient will demo ability to ambulate >/= 200 ft with RW on outdoors surface with AFO and improved upright posture    Baseline ambulation with increased flexed posture and decreased scanning environment; able to scan environment with verbal cues continue to demo flexed posture with 200 ft and use of RW    Time 6    Period Weeks    Status Revised      PT LONG TERM GOAL #4   Title Patient will report improved tolerance for wearing AFO all wake hours and report understanding of how to donn/doff device with assistance    Baseline AFO not obtained    Time 6    Period  Weeks    Status New                   Plan  - 11/15/20 1352     Clinical Impression Statement Continued using L thuasne PLS AFO for gait today - pt to receive his own AFO next week. Continued to practice with [facility] with initial cues for sequencing and posture/looking ahead throughout vs. looking down at ground. LLE did fatigue towards end of 2nd bout of 115' requiring min A for balance. Remainder of session focused on NMR for incr LLE weight shift/balance with decr UE support. Will continue to progress towards LTGs.    Personal Factors and Comorbidities Comorbidity 3+    Comorbidities HTN, alcohol abuse (ongoing use), HLD, GERD, PUD, tobacco use    Examination-Activity Limitations Bed Mobility;Dressing;Locomotion Level;Stairs;Stand;Toileting;Transfers;Bathing    Examination-Participation Restrictions Driving;Interpersonal Relationship;Yard Work;Community Activity    Stability/Clinical Decision Making Evolving/Moderate complexity    Rehab Potential Good    PT Frequency 1x / week    PT Duration 8 weeks    PT Treatment/Interventions ADLs/Self Care Home Management;Electrical Stimulation;DME Instruction;Moist Heat;Cryotherapy;Gait training;Stair training;Functional mobility training;Therapeutic activities;Therapeutic exercise;Balance training;Neuromuscular re-education;Patient/family education;Orthotic Fit/Training;Wheelchair mobility training;Passive range of motion;Aquatic Therapy;Vestibular;Manual techniques;Dry needling;Joint Manipulations;Spinal Manipulations    PT Next Visit Plan Continue with use of L PLS [place] Continue practicing with [facility], turns and negotiating around obstacles.  Work on standing balance working toward narrow BOS. standing weight shift activities    Consulted and Agree with Plan of Care Patient;Family member/caregiver    Family Member Consulted [name]             Patient will benefit from skilled therapeutic intervention in order to improve the following deficits and impairments:  Abnormal gait, Decreased  coordination, Difficulty walking, Impaired tone, Decreased safety awareness, Decreased endurance, Decreased activity tolerance, Pain, Decreased balance, Decreased knowledge of use of DME, Postural dysfunction, Decreased strength, Decreased mobility  Visit Diagnosis: Other abnormalities of gait and mobility  Unsteadiness on feet  Difficulty in walking, not elsewhere classified  Muscle weakness (generalized)     Problem List Patient Active Problem List   Diagnosis Date Noted   H/O ETOH abuse 04/28/2020   History of tobacco abuse 01/07/2020   Hemiparesis affecting left side as late effect of stroke ([place]) 01/07/2020   Abnormality of gait 09/24/2019   AKI (acute kidney injury) ([place])    Fever    Sepsis without acute organ dysfunction (HCC)    Elevated BUN    Blood pressure increase diastolic    Labile blood pressure    Leukopenia    Benign essential HTN    Hypoalbuminemia    Cerebrovascular accident (CVA) of right basal ganglia ([place]) 07/07/2019   Acute ischemic stroke ([place])    Dyslipidemia    Dysphagia, post-stroke    Polycythemia    Stroke ([place]) 06/30/2019   Prediabetes 12/23/2016   Elevated LFTs 12/23/2016   Pure hypercholesterolemia 12/21/2016   Alcoholism ([place]) 12/21/2016   Eczema 09/18/2013   Essential hypertension 09/18/2013   Gastroesophageal reflux disease without esophagitis 09/18/2013   Tobacco abuse 09/18/2013    Arliss Journey, PT, DPT  11/15/2020, 1:53 PM  [facility] [address] [place], [name], [address] Phone: [phone]   Fax:  [phone]  Name: Russell Russell MRN: [id number] Date of Birth: 1951/01/29

## 2020-11-21 DIAGNOSIS — M21372 Foot drop, left foot: Secondary | ICD-10-CM | POA: Diagnosis not present

## 2020-11-22 ENCOUNTER — Ambulatory Visit: Payer: Medicare HMO

## 2020-11-22 ENCOUNTER — Other Ambulatory Visit: Payer: Self-pay

## 2020-11-22 DIAGNOSIS — R2681 Unsteadiness on feet: Secondary | ICD-10-CM | POA: Diagnosis not present

## 2020-11-22 DIAGNOSIS — R262 Difficulty in walking, not elsewhere classified: Secondary | ICD-10-CM

## 2020-11-22 DIAGNOSIS — M6281 Muscle weakness (generalized): Secondary | ICD-10-CM

## 2020-11-22 DIAGNOSIS — R2689 Other abnormalities of gait and mobility: Secondary | ICD-10-CM

## 2020-11-22 NOTE — Therapy (Signed)
[facility] [address] [place], [name], [address] Phone: [phone]   Fax:  [phone]  Physical Therapy Treatment  Patient Details  Name: Russell Russell MRN: [id number] Date of Birth: Dec 21, 1950 Referring Provider (PT): Jamse Arn, MD   Encounter Date: 11/22/2020   PT End of Session - 11/22/20 1153     Visit Number 19    Number of Visits 22    Date for PT Re-Evaluation 12/23/20   POC for 6 weeks, pushed out due to being on [place] Medicare (medicare guidelines, 10th Visit PN)    Authorization Time Period awaiting new authorization;    Progress Note Due on Visit 20    PT Start Time 1146    PT Stop Time 1229    PT Time Calculation (min) 43 min    Equipment Utilized During Treatment Gait belt    Activity Tolerance Patient tolerated treatment well    Behavior During Therapy WFL for tasks assessed/performed             Past Medical History:  Diagnosis Date   Alcohol abuse    drinks 1 gallon of Gin every weekend, nothing during the week x >15 yrs   Elevated transaminase level    +elev bili: abd u/s 06/2014 showed stable small hepatic hemangiomas, o/w normal.   Essential hypertension    GERD (gastroesophageal reflux disease)    Hyperlipidemia 03/2014   Atorv started-chol improved   Impaired fasting glucose 03/2014   Nephrolithiasis    PUD (peptic ulcer disease)    Stroke ([place])    Tobacco dependence    chantix: "psych effects"    Past Surgical History:  Procedure Laterality Date   COLONOSCOPY  2005   Recall 10 yrs ([place])   LOOP RECORDER INSERTION N/A 07/03/2019   Procedure: LOOP RECORDER INSERTION;  Surgeon: Thompson Grayer, MD;  Location: [facility];  Service: Cardiovascular;  Laterality: N/A;   removal of kidney stones  2006   cystoscopic--removed from ureter.  No prob since.    There were no vitals filed for this visit.   Subjective Assessment -  11/22/20 1151     Subjective Patient reports no new changes. Has recieved his personal AFO from [facility]. No pain.    Patient is accompained by: Family member   spouse   Pertinent History PMH: HTN, alcohol abuse (ongoing use), HLD, GERD, PUD, tobacco use    Limitations Standing;Walking;House hold activities    Patient Stated Goals improve strength of L Leg; Be able to walk without dragging LLE    Currently in Pain? No/denies                 [facility] Adult PT Treatment/Exercise - 11/22/20 0001       Transfers   Transfers Sit to Stand;Stand to Sit    Sit to Stand 5: Supervision    Stand to Sit 5: Supervision    Comments performed sit <> stands from mat table with LLE posteriorly for incr LLE weight bearing with arms crossed and no use of UE support. Completed x 5 reps. Cues for forward lean.      Ambulation/Gait   Ambulation/Gait Yes    Ambulation/Gait Assistance 5: Supervision;4: Min guard    Ambulation/Gait Assistance Details Completed ambulation with [facility] x 150 ft with PT providing cues for improved step length and upright posture. Cues to look forward with ambulation vs down. Increased challenge noted with turns.  Ambulation Distance (Feet) 150 Feet    Assistive device Rolling walker;Small based quad cane    Gait Pattern Step-through pattern;Step-to pattern;Decreased stride length;Decreased stance time - right;Decreased step length - left    Ambulation Surface Level;Indoor    Stairs Yes    Stairs Assistance 4: Min guard    Stairs Assistance Details (indicate cue type and reason) practiced stair negotiation with single rail and SPC on R side. Completed x 4 steps. cues regarding sequencing required due to increased challenge.    Stair Management Technique One rail Left;With cane;Step to pattern;Forwards    Number of Stairs 4    Height of Stairs 6      High Level Balance   High Level Balance Activities Negotitating around obstacles;Negotiating over obstacles    High Level  Balance Comments Completed ambulation with [facility] working on negotiating around cones, completed x 2 laps down and back cues for intermittent. Cues for improved 180 deg turns, as most challenge ntoed with step length of LLE. Started negotiating over obstalces in // bars with single rail, then progressing to completed with [facility] in // bars x 4 laps down and back. paitent continue to want to grab // bars with LUE. Therefore prgoressed outside of bars and completed negotiating over black balance beam x 4 reps. Improved completed with RLE leading and LLE trailing.                       PT Short Term Goals - 10/03/20 1241       PT SHORT TERM GOAL #1   Title Patient will obtain AFO and begin report understanding of wear schedule (All STGS Due: 11/25/20)    Baseline AFO not obtained    Time 3    Period Weeks    Status New    Target Date 08/30/20               PT Long Term Goals - 10/03/20 1242       PT LONG TERM GOAL #1   Title Patient will report completing walking program >/= 20 minutes daily (ALL LTG Due: 12/23/20)    Baseline completing approx 10 minutes daily, everyday    Time 6    Period Weeks    Status On-going    Target Date 12/23/20      PT LONG TERM GOAL #2   Title Patient will improve 5x sit <> stand to </= 12 secs with BUE support    Baseline 20.31 secs; 14.97 secs    Time 6    Period Weeks    Status Revised      PT LONG TERM GOAL #3   Title Patient will demo ability to ambulate >/= 200 ft with RW on outdoors surface with AFO and improved upright posture    Baseline ambulation with increased flexed posture and decreased scanning environment; able to scan environment with verbal cues continue to demo flexed posture with 200 ft and use of RW    Time 6    Period Weeks    Status Revised      PT LONG TERM GOAL #4   Title Patient will report improved tolerance for wearing AFO all wake hours and report understanding of how to donn/doff device with assistance     Baseline AFO not obtained    Time 6    Period Weeks    Status New  Plan - 11/22/20 1337     Clinical Impression Statement Patient rece[facility]ved [facility] from [facility]. Continued gait training with AFO and SBQC working on improved gait and negotiatioing around/over obstalces. Also intiated stair training with use of SBQC for improved entry/exit of home. WIll continue to progress toward all LTGs.    Personal Factors and Comorbidities Comorbidity 3+    Comorbidities HTN, alcohol abuse (ongoing use), HLD, GERD, PUD, tobacco use    Examination-Activity Limitations Bed Mobility;Dressing;Locomotion Level;Stairs;Stand;Toileting;Transfers;Bathing    Examination-Participation Restrictions Driving;Interpersonal Relationship;Yard Work;Community Activity    Stability/Clinical Decision Making Evolving/Moderate complexity    Rehab Potential Good    PT Frequency 1x / week    PT Duration 8 weeks    PT Treatment/Interventions ADLs/Self Care Home Management;Electrical Stimulation;DME Instruction;Moist Heat;Cryotherapy;Gait training;Stair training;Functional mobility training;Therapeutic activities;Therapeutic exercise;Balance training;Neuromuscular re-education;Patient/family education;Orthotic Fit/Training;Wheelchair mobility training;Passive range of motion;Aquatic Therapy;Vestibular;Manual techniques;Dry needling;Joint Manipulations;Spinal Manipulations    PT Next Visit Plan Continue with use of L PLS [place] Continue practicing with [facility], turns and negotiating around obstacles.  Continue working on stairs with [employer]. Work on standing balance working toward narrow BOS. standing weight shift activities    Consulted and Agree with Plan of Care Patient;Family member/caregiver    Family Member Consulted [name]             Patient will benefit from skilled therapeutic intervention in order to improve the following deficits and impairments:  Abnormal gait, Decreased coordination,  Difficulty walking, Impaired tone, Decreased safety awareness, Decreased endurance, Decreased activity tolerance, Pain, Decreased balance, Decreased knowledge of use of DME, Postural dysfunction, Decreased strength, Decreased mobility  Visit Diagnosis: Other abnormalities of gait and mobility  Unsteadiness on feet  Difficulty in walking, not elsewhere classified  Muscle weakness (generalized)     Problem List Patient Active Problem List   Diagnosis Date Noted   H/O ETOH abuse 04/28/2020   History of tobacco abuse 01/07/2020   Hemiparesis affecting left side as late effect of stroke ([place]) 01/07/2020   Abnormality of gait 09/24/2019   AKI (acute kidney injury) ([place])    Fever    Sepsis without acute organ dysfunction (HCC)    Elevated BUN    Blood pressure increase diastolic    Labile blood pressure    Leukopenia    Benign essential HTN    Hypoalbuminemia    Cerebrovascular accident (CVA) of right basal ganglia ([place]) 07/07/2019   Acute ischemic stroke ([place])    Dyslipidemia    Dysphagia, post-stroke    Polycythemia    Stroke ([place]) 06/30/2019   Prediabetes 12/23/2016   Elevated LFTs 12/23/2016   Pure hypercholesterolemia 12/21/2016   Alcoholism ([place]) 12/21/2016   Eczema 09/18/2013   Essential hypertension 09/18/2013   Gastroesophageal reflux disease without esophagitis 09/18/2013   Tobacco abuse 09/18/2013    Jones Bales, PT, DPT 11/22/2020, 1:39 PM  [facility] [address] [place], [name], [address] Phone: [phone]   Fax:  [phone]  Name: Devarion Tornow MRN: [id number] Date of Birth: 02-Sep-1950

## 2020-11-23 LAB — CUP PACEART REMOTE DEVICE CHECK
Date Time Interrogation Session: 20220909230558
Implantable Pulse Generator Implant Date: 20210423

## 2020-11-28 ENCOUNTER — Ambulatory Visit (INDEPENDENT_AMBULATORY_CARE_PROVIDER_SITE_OTHER): Payer: Medicare HMO

## 2020-11-28 DIAGNOSIS — I6340 Cerebral infarction due to embolism of unspecified cerebral artery: Secondary | ICD-10-CM | POA: Diagnosis not present

## 2020-11-29 ENCOUNTER — Ambulatory Visit: Payer: Medicare HMO

## 2020-11-29 ENCOUNTER — Other Ambulatory Visit: Payer: Self-pay

## 2020-11-29 DIAGNOSIS — R2689 Other abnormalities of gait and mobility: Secondary | ICD-10-CM

## 2020-11-29 DIAGNOSIS — M6281 Muscle weakness (generalized): Secondary | ICD-10-CM | POA: Diagnosis not present

## 2020-11-29 DIAGNOSIS — R262 Difficulty in walking, not elsewhere classified: Secondary | ICD-10-CM

## 2020-11-29 DIAGNOSIS — R2681 Unsteadiness on feet: Secondary | ICD-10-CM | POA: Diagnosis not present

## 2020-11-29 NOTE — Therapy (Signed)
[facility] [address] [place], [name], [address] Phone: [phone]   Fax:  [phone]  Physical Therapy Treatment/Progress Note  Patient Details  Name: Russell Russell MRN: [id number] Date of Birth: 06/13/1950 Referring Provider (PT): Jamse Arn, MD  Physical Therapy Progress Note   Dates of Reporting Period: 08/30/20 - 11/29/20  See Note below for Objective Data and Assessment of Progress/Goals.  Thank you for the referral of this patient. Guillermina [place], PT, DPT   Encounter Date: 11/29/2020   PT End of Session - 11/29/20 1150     Visit Number 20    Number of Visits 22    Date for PT Re-Evaluation 12/23/20   POC for 6 weeks, pushed out due to being on [employer] (medicare guidelines, 10th Visit PN)    Authorization Time Period awaiting new authorization;    Progress Note Due on Visit 20    PT Start Time 1147    PT Stop Time 1228    PT Time Calculation (min) 41 min    Equipment Utilized During Treatment Gait belt    Activity Tolerance Patient tolerated treatment well    Behavior During Therapy WFL for tasks assessed/performed             Past Medical History:  Diagnosis Date   Alcohol abuse    drinks 1 gallon of Gin every weekend, nothing during the week x >15 yrs   Elevated transaminase level    +elev bili: abd u/s 06/2014 showed stable small hepatic hemangiomas, o/w normal.   Essential hypertension    GERD (gastroesophageal reflux disease)    Hyperlipidemia 03/2014   Atorv started-chol improved   Impaired fasting glucose 03/2014   Nephrolithiasis    PUD (peptic ulcer disease)    Stroke ([place])    Tobacco dependence    chantix: "psych effects"    Past Surgical History:  Procedure Laterality Date   COLONOSCOPY  2005   Recall 10 yrs ([place])   LOOP RECORDER INSERTION N/A 07/03/2019   Procedure: LOOP RECORDER INSERTION;  Surgeon: Thompson Grayer, MD;   Location: [facility];  Service: Cardiovascular;  Laterality: N/A;   removal of kidney stones  2006   cystoscopic--removed from ureter.  No prob since.    There were no vitals filed for this visit.   Subjective Assessment - 11/29/20 1151     Subjective Reports continue to wear brace with no skin or pain. No falls. No other new changes complaints. No Pain.    Patient is accompained by: Family member   spouse   Pertinent History PMH: HTN, alcohol abuse (ongoing use), HLD, GERD, PUD, tobacco use    Limitations Standing;Walking;House hold activities    Patient Stated Goals improve strength of L Leg; Be able to walk without dragging LLE    Currently in Pain? No/denies              [facility] Adult PT Treatment/Exercise - 11/29/20 0001       Transfers   Transfers Sit to Stand;Stand to Sit    Sit to Stand 5: Supervision    Stand to Sit 5: Supervision    Comments cues to reach back to surface prior to descent to avoid putting pressure on [facility]      Ambulation/Gait   Ambulation/Gait Yes    Ambulation/Gait Assistance 5: Supervision    Ambulation/Gait Assistance Details Completed ambulation with SBQC x 80 ft with PT providing cues  for improved step length and upright posture. Then continued gait walking towards surface working on improved turns. Improved turnign noted to R vs. L, completed x 4 reps.    Ambulation Distance (Feet) 80 Feet   4 x10"   Assistive device Rolling walker;Small based quad cane    Gait Pattern Step-through pattern;Step-to pattern;Decreased stride length;Decreased stance time - right;Decreased step length - left    Ambulation Surface Level;Indoor    Stairs Yes    Stairs Assistance 4: Min guard    Stairs Assistance Details (indicate cue type and reason) practiced stair negotiation with single rail and SPC on R side. Completed x 4 steps. cues regarding sequencing required due to increased challenge, continue to demo challenge. Then completed with bilateral rails,  educating this may be the safest option at this time. Wife to carry [facility] up/down stairs.    Stair Management Technique One rail Left;With cane;Step to pattern;Forwards;Two rails    Number of Stairs 8    Height of Stairs 6      Exercises   Exercises Knee/Hip      Knee/Hip Exercises: Aerobic   Other Aerobic Scifit level 2.5 with BLE and BUE for 6 minutes with goal >/= 40 rpm for strengthening/activity tolerance/and recirpocal motion. Working on [employer].                     PT Education - 11/29/20 1228     Education Details trial of SBQC in home, short distance. Continued use of RW outdoors/long distance    Person(s) Educated Patient;Spouse    Methods Explanation    Comprehension Verbalized understanding              PT Short Term Goals - 11/29/20 1231       PT SHORT TERM GOAL #1   Title Patient will obtain AFO and begin report understanding of wear schedule (All STGS Due: 11/25/20)    Baseline AFO not obtained; Recieved L AFO    Time 3    Period Weeks    Status Achieved    Target Date 08/30/20               PT Long Term Goals - 10/03/20 1242       PT LONG TERM GOAL #1   Title Patient will report completing walking program >/= 20 minutes daily (ALL LTG Due: 12/23/20)    Baseline completing approx 10 minutes daily, everyday    Time 6    Period Weeks    Status On-going    Target Date 12/23/20      PT LONG TERM GOAL #2   Title Patient will improve 5x sit <> stand to </= 12 secs with BUE support    Baseline 20.31 secs; 14.97 secs    Time 6    Period Weeks    Status Revised      PT LONG TERM GOAL #3   Title Patient will demo ability to ambulate >/= 200 ft with RW on outdoors surface with AFO and improved upright posture    Baseline ambulation with increased flexed posture and decreased scanning environment; able to scan environment with verbal cues continue to demo flexed posture with 200 ft and use of RW    Time 6    Period Weeks    Status  Revised      PT LONG TERM GOAL #4   Title Patient will report improved tolerance for wearing AFO all wake hours and report understanding of how  to donn/doff device with assistance    Baseline AFO not obtained    Time 6    Period Weeks    Status New                   Plan - 11/29/20 1247     Clinical Impression Statement Patient able to meet all STG, has obtained AFO. Continued gait training with [facility] household distances with patient tolerating well. PT clearing patient to use SBQC short distances within the home. WIll continue to progress toward all LTGs.    Personal Factors and Comorbidities Comorbidity 3+    Comorbidities HTN, alcohol abuse (ongoing use), HLD, GERD, PUD, tobacco use    Examination-Activity Limitations Bed Mobility;Dressing;Locomotion Level;Stairs;Stand;Toileting;Transfers;Bathing    Examination-Participation Restrictions Driving;Interpersonal Relationship;Yard Work;Community Activity    Stability/Clinical Decision Making Evolving/Moderate complexity    Rehab Potential Good    PT Frequency 1x / week    PT Duration 8 weeks    PT Treatment/Interventions ADLs/Self Care Home Management;Electrical Stimulation;DME Instruction;Moist Heat;Cryotherapy;Gait training;Stair training;Functional mobility training;Therapeutic activities;Therapeutic exercise;Balance training;Neuromuscular re-education;Patient/family education;Orthotic Fit/Training;Wheelchair mobility training;Passive range of motion;Aquatic Therapy;Vestibular;Manual techniques;Dry needling;Joint Manipulations;Spinal Manipulations    PT Next Visit Plan Continue with use of L PLS [place] Continue practicing with [facility], turns and negotiating around obstacles.  Continue working on stairs with [employer]. Work on standing balance working toward narrow BOS. standing weight shift activities    Consulted and Agree with Plan of Care Patient;Family member/caregiver    Family Member Consulted [name]             Patient  will benefit from skilled therapeutic intervention in order to improve the following deficits and impairments:  Abnormal gait, Decreased coordination, Difficulty walking, Impaired tone, Decreased safety awareness, Decreased endurance, Decreased activity tolerance, Pain, Decreased balance, Decreased knowledge of use of DME, Postural dysfunction, Decreased strength, Decreased mobility  Visit Diagnosis: Other abnormalities of gait and mobility  Unsteadiness on feet  Difficulty in walking, not elsewhere classified  Muscle weakness (generalized)     Problem List Patient Active Problem List   Diagnosis Date Noted   H/O ETOH abuse 04/28/2020   History of tobacco abuse 01/07/2020   Hemiparesis affecting left side as late effect of stroke ([place]) 01/07/2020   Abnormality of gait 09/24/2019   AKI (acute kidney injury) ([place])    Fever    Sepsis without acute organ dysfunction (HCC)    Elevated BUN    Blood pressure increase diastolic    Labile blood pressure    Leukopenia    Benign essential HTN    Hypoalbuminemia    Cerebrovascular accident (CVA) of right basal ganglia ([place]) 07/07/2019   Acute ischemic stroke ([place])    Dyslipidemia    Dysphagia, post-stroke    Polycythemia    Stroke ([place]) 06/30/2019   Prediabetes 12/23/2016   Elevated LFTs 12/23/2016   Pure hypercholesterolemia 12/21/2016   Alcoholism ([place]) 12/21/2016   Eczema 09/18/2013   Essential hypertension 09/18/2013   Gastroesophageal reflux disease without esophagitis 09/18/2013   Tobacco abuse 09/18/2013    Jones Bales, PT, DPT 11/29/2020, 1:18 PM  [facility] [address] [place], [name], [address] Phone: [phone]   Fax:  [phone]  Name: Nashton Belson MRN: [id number] Date of Birth: Jul 26, 1950

## 2020-12-02 NOTE — Progress Notes (Signed)
[facility]link Summary Report / Loop Recorder 

## 2020-12-06 ENCOUNTER — Ambulatory Visit: Payer: Medicare HMO

## 2020-12-06 ENCOUNTER — Other Ambulatory Visit: Payer: Self-pay

## 2020-12-06 DIAGNOSIS — R2689 Other abnormalities of gait and mobility: Secondary | ICD-10-CM | POA: Diagnosis not present

## 2020-12-06 DIAGNOSIS — R2681 Unsteadiness on feet: Secondary | ICD-10-CM | POA: Diagnosis not present

## 2020-12-06 DIAGNOSIS — M6281 Muscle weakness (generalized): Secondary | ICD-10-CM

## 2020-12-06 DIAGNOSIS — R262 Difficulty in walking, not elsewhere classified: Secondary | ICD-10-CM

## 2020-12-06 NOTE — Therapy (Signed)
[facility] [address] [place], [name], [address] Phone: [phone]   Fax:  [phone]  Physical Therapy Treatment  Patient Details  Name: Russell Russell MRN: [id number] Date of Birth: May 27, 1950 Referring Provider (PT): Jamse Arn, MD   Encounter Date: 12/06/2020   PT End of Session - 12/06/20 1143     Visit Number 21    Number of Visits 22    Date for PT Re-Evaluation 12/23/20   POC for 6 weeks, pushed out due to being on [place] [employer] (medicare guidelines, 10th Visit PN)    Authorization Time Period awaiting new authorization;    Progress Note Due on Visit 20    PT Start Time 1143    PT Stop Time 1227    PT Time Calculation (min) 44 min    Equipment Utilized During Treatment Gait belt    Activity Tolerance Patient tolerated treatment well    Behavior During Therapy WFL for tasks assessed/performed             Past Medical History:  Diagnosis Date   Alcohol abuse    drinks 1 gallon of Gin every weekend, nothing during the week x >15 yrs   Elevated transaminase level    +elev bili: abd u/s 06/2014 showed stable small hepatic hemangiomas, o/w normal.   Essential hypertension    GERD (gastroesophageal reflux disease)    Hyperlipidemia 03/2014   Atorv started-chol improved   Impaired fasting glucose 03/2014   Nephrolithiasis    PUD (peptic ulcer disease)    Stroke ([place])    Tobacco dependence    chantix: "psych effects"    Past Surgical History:  Procedure Laterality Date   COLONOSCOPY  2005   Recall 10 yrs ([place])   LOOP RECORDER INSERTION N/A 07/03/2019   Procedure: LOOP RECORDER INSERTION;  Surgeon: Thompson Grayer, MD;  Location: [facility];  Service: Cardiovascular;  Laterality: N/A;   removal of kidney stones  2006   cystoscopic--removed from ureter.  No prob since.    There were no vitals filed for this visit.   Subjective Assessment -  12/06/20 1148     Subjective Patient reports no new changes/complaints. Brought in the [facility] today. Reports that using it at home has went well. No falls.    Patient is accompained by: Family member   spouse   Pertinent History PMH: HTN, alcohol abuse (ongoing use), HLD, GERD, PUD, tobacco use    Limitations Standing;Walking;House hold activities    Patient Stated Goals improve strength of L Leg; Be able to walk without dragging LLE    Currently in Pain? No/denies                [facility] Adult PT Treatment/Exercise - 12/06/20 0001       Ambulation/Gait   Ambulation/Gait Yes    Ambulation/Gait Assistance 5: Supervision    Ambulation/Gait Assistance Details Continue to ambulate into session with RW, but patient brought in personal [facility] today into session. Completed  ambulation with SBQC x 115 ft, then additional. Intermittent cues for step length but able to maintain balance without assistance, close supervision.    Ambulation Distance (Feet) 115 Feet   x 1; 60 x 1 with addition of turn.   Assistive device Rolling walker;Small based quad cane    Gait Pattern Step-through pattern;Step-to pattern;Decreased stride length;Decreased stance time - right;Decreased step length - left    Ambulation Surface Level;Indoor  High Level Balance   High Level Balance Activities Side stepping    High Level Balance Comments Compelted side stepping with single UE (RUE) x 3 laps down and back in // bars, cues for step length.      Exercises   Exercises Knee/Hip      Knee/Hip Exercises: Aerobic   Other Aerobic Scifit level 3.5 with BLE and BUE for 7 minutes with goal >/= 40 rpm for strengthening and activity tolerance. Patient tolerated increase in resistance well.              Balance Exercises - 12/06/20 0001       Balance Exercises: Standing   Step Ups Forward;6 inch;UE support 1;Limitations    Step Ups Limitations completed x 10 reps leading with LLE and working on upright posture.             HEP Review: Access Code: [email or web address] URL: [email or web address] Date: 12/06/2020 Prepared by: Baldomero Lamy  Exercises Seated Long Arc Quad - 1 x daily - 5 x weekly - 2 sets - 10 reps Seated Ankle Dorsiflexion with Resistance - 1 x daily - 5 x weekly - 2 sets - 10 reps Seated Hamstring Curl with Anchored Resistance - 1 x daily - 5 x weekly - 2 sets - 10 reps Supine Active Straight Leg Raise - 1 x daily - 5 x weekly - 2 sets - 10 reps Side Stepping with Counter Support - 1 x daily - 5 x weekly - 2 sets - 10 reps Standing Marching - 1 x daily - 5 x weekly - 2 sets - 10 reps Mini Squat with Counter Support - 1 x daily - 5 x weekly - 2 sets - 10 reps Clamshell with Resistance - 1 x daily - 5 x weekly - 2 sets - 10 reps     PT Education - 12/06/20 1336     Education Details review of HEP    Person(s) Educated Patient;Spouse    Methods Explanation    Comprehension Verbalized understanding              PT Short Term Goals - 11/29/20 1231       PT SHORT TERM GOAL #1   Title Patient will obtain AFO and begin report understanding of wear schedule (All STGS Due: 11/25/20)    Baseline AFO not obtained; Recieved L AFO    Time 3    Period Weeks    Status Achieved    Target Date 08/30/20               PT Long Term Goals - 10/03/20 1242       PT LONG TERM GOAL #1   Title Patient will report completing walking program >/= 20 minutes daily (ALL LTG Due: 12/23/20)    Baseline completing approx 10 minutes daily, everyday    Time 6    Period Weeks    Status On-going    Target Date 12/23/20      PT LONG TERM GOAL #2   Title Patient will improve 5x sit <> stand to </= 12 secs with BUE support    Baseline 20.31 secs; 14.97 secs    Time 6    Period Weeks    Status Revised      PT LONG TERM GOAL #3   Title Patient will demo ability to ambulate >/= 200 ft with RW on outdoors surface with AFO and improved upright posture    Baseline ambulation with  increased flexed  posture and decreased scanning environment; able to scan environment with verbal cues continue to demo flexed posture with 200 ft and use of RW    Time 6    Period Weeks    Status Revised      PT LONG TERM GOAL #4   Title Patient will report improved tolerance for wearing AFO all wake hours and report understanding of how to donn/doff device with assistance    Baseline AFO not obtained    Time 6    Period Weeks    Status New                   Plan - 12/06/20 1158     Clinical Impression Statement Continued strengthening and balance activities throughout session, patient tolerating well. Continue to require intermittent cues for step length. Continued gait with SBQC, patient bringing in personal today. Patient is doing well and made progress with PT services. Expected d/c at next visit.    Personal Factors and Comorbidities Comorbidity 3+    Comorbidities HTN, alcohol abuse (ongoing use), HLD, GERD, PUD, tobacco use    Examination-Activity Limitations Bed Mobility;Dressing;Locomotion Level;Stairs;Stand;Toileting;Transfers;Bathing    Examination-Participation Restrictions Driving;Interpersonal Relationship;Yard Work;Community Activity    Stability/Clinical Decision Making Evolving/Moderate complexity    Rehab Potential Good    PT Frequency 1x / week    PT Duration 8 weeks    PT Treatment/Interventions ADLs/Self Care Home Management;Electrical Stimulation;DME Instruction;Moist Heat;Cryotherapy;Gait training;Stair training;Functional mobility training;Therapeutic activities;Therapeutic exercise;Balance training;Neuromuscular re-education;Patient/family education;Orthotic Fit/Training;Wheelchair mobility training;Passive range of motion;Aquatic Therapy;Vestibular;Manual techniques;Dry needling;Joint Manipulations;Spinal Manipulations    PT Next Visit Plan Continue with use of L PLS [place] Continue practicing with [facility], turns and negotiating around obstacles.  Continue  working on stairs with [employer]. Work on standing balance working toward narrow BOS. standing weight shift activities    Consulted and Agree with Plan of Care Patient;Family member/caregiver    Family Member Consulted [name]             Patient will benefit from skilled therapeutic intervention in order to improve the following deficits and impairments:  Abnormal gait, Decreased coordination, Difficulty walking, Impaired tone, Decreased safety awareness, Decreased endurance, Decreased activity tolerance, Pain, Decreased balance, Decreased knowledge of use of DME, Postural dysfunction, Decreased strength, Decreased mobility  Visit Diagnosis: Other abnormalities of gait and mobility  Unsteadiness on feet  Muscle weakness (generalized)  Difficulty in walking, not elsewhere classified     Problem List Patient Active Problem List   Diagnosis Date Noted   H/O ETOH abuse 04/28/2020   History of tobacco abuse 01/07/2020   Hemiparesis affecting left side as late effect of stroke ([place]) 01/07/2020   Abnormality of gait 09/24/2019   AKI (acute kidney injury) ([place])    Fever    Sepsis without acute organ dysfunction (HCC)    Elevated BUN    Blood pressure increase diastolic    Labile blood pressure    Leukopenia    Benign essential HTN    Hypoalbuminemia    Cerebrovascular accident (CVA) of right basal ganglia ([place]) 07/07/2019   Acute ischemic stroke ([place])    Dyslipidemia    Dysphagia, post-stroke    Polycythemia    Stroke ([place]) 06/30/2019   Prediabetes 12/23/2016   Elevated LFTs 12/23/2016   Pure hypercholesterolemia 12/21/2016   Alcoholism ([place]) 12/21/2016   Eczema 09/18/2013   Essential hypertension 09/18/2013   Gastroesophageal reflux disease without esophagitis 09/18/2013   Tobacco abuse 09/18/2013    Jones Bales, PT, DPT 12/06/2020, 1:37  PM  [facility] [address] [place], [name], [address] Phone:  [phone]   Fax:  [phone]  Name: Laderrick Wilk MRN: [id number] Date of Birth: 03-20-1950

## 2020-12-13 ENCOUNTER — Ambulatory Visit: Payer: Medicare HMO

## 2020-12-15 DIAGNOSIS — Z1211 Encounter for screening for malignant neoplasm of colon: Secondary | ICD-10-CM | POA: Diagnosis not present

## 2020-12-20 ENCOUNTER — Telehealth: Payer: Self-pay

## 2020-12-20 MED ORDER — ATORVASTATIN CALCIUM 40 MG PO TABS: 40.0000 mg | ORAL_TABLET | Freq: Every day | ORAL | 1 refills | Status: DC

## 2020-12-20 NOTE — Telephone Encounter (Signed)
Rx sent to pharmacy as requested.

## 2020-12-21 ENCOUNTER — Ambulatory Visit: Payer: Medicare HMO | Attending: Physician Assistant

## 2020-12-21 ENCOUNTER — Other Ambulatory Visit: Payer: Self-pay

## 2020-12-21 DIAGNOSIS — R2681 Unsteadiness on feet: Secondary | ICD-10-CM | POA: Insufficient documentation

## 2020-12-21 DIAGNOSIS — R2689 Other abnormalities of gait and mobility: Secondary | ICD-10-CM | POA: Diagnosis not present

## 2020-12-21 DIAGNOSIS — R262 Difficulty in walking, not elsewhere classified: Secondary | ICD-10-CM | POA: Insufficient documentation

## 2020-12-21 DIAGNOSIS — M6281 Muscle weakness (generalized): Secondary | ICD-10-CM | POA: Diagnosis not present

## 2020-12-21 LAB — COLOGUARD: Cologuard: NEGATIVE

## 2020-12-21 NOTE — Therapy (Signed)
[facility] [address] [place], [name], [address] Phone: [phone]   Fax:  [phone]  Physical Therapy Treatment/Discharge Summary  Patient Details  Name: Russell Russell MRN: [id number] Date of Birth: March 07, 1951 Referring Provider (PT): Jamse Arn, MD  PHYSICAL THERAPY DISCHARGE SUMMARY  Visits from Start of Care: 22  Current functional level related to goals / functional outcomes: See Clinical Impression Statement   Remaining deficits: Medium Fall Risk, Abnormal Gait, Apraxia   Education / Equipment: HEP/Walkng Program    Patient agrees to discharge. Patient goals were met. Patient is being discharged due to meeting the stated rehab goals.  Encounter Date: 12/21/2020   PT End of Session - 12/21/20 1105     Visit Number 22    Number of Visits 22    Date for PT Re-Evaluation 12/23/20   POC for 6 weeks, pushed out due to being on [place] Medicare (medicare guidelines, 10th Visit PN)    Authorization Time Period awaiting new authorization;    Progress Note Due on Visit 20    PT Start Time 1101    PT Stop Time 1138    PT Time Calculation (min) 37 min    Equipment Utilized During Treatment Gait belt    Activity Tolerance Patient tolerated treatment well    Behavior During Therapy WFL for tasks assessed/performed             Past Medical History:  Diagnosis Date   Alcohol abuse    drinks 1 gallon of Gin every weekend, nothing during the week x >15 yrs   Elevated transaminase level    +elev bili: abd u/s 06/2014 showed stable small hepatic hemangiomas, o/w normal.   Essential hypertension    GERD (gastroesophageal reflux disease)    Hyperlipidemia 03/2014   Atorv started-chol improved   Impaired fasting glucose 03/2014   Nephrolithiasis    PUD (peptic ulcer disease)    Stroke ([place])    Tobacco dependence    chantix: "psych effects"    Past Surgical History:   Procedure Laterality Date   COLONOSCOPY  2005   Recall 10 yrs ([place])   LOOP RECORDER INSERTION N/A 07/03/2019   Procedure: LOOP RECORDER INSERTION;  Surgeon: Thompson Grayer, MD;  Location: [facility];  Service: Cardiovascular;  Laterality: N/A;   removal of kidney stones  2006   cystoscopic--removed from ureter.  No prob since.    There were no vitals filed for this visit.   Subjective Assessment - 12/21/20 1105     Subjective No new changes/complaints. Still using SBQC at home with no issues. No Falls.    Patient is accompained by: Family member   spouse   Pertinent History PMH: HTN, alcohol abuse (ongoing use), HLD, GERD, PUD, tobacco use    Limitations Standing;Walking;House hold activities    Patient Stated Goals improve strength of L Leg; Be able to walk without dragging LLE    Currently in Pain? No/denies                [facility] PT Assessment - 12/21/20 0001       Assessment   Medical Diagnosis R Basal Ganglia CVA    Referring Provider (PT) Jamse Arn, MD               [facility] Adult PT Treatment/Exercise - 12/21/20 0001       Transfers   Transfers Sit to Stand;Stand to Sit  Sit to Stand 5: Supervision    Five time sit to stand comments  13.82 seconds with BUE support    Stand to Sit 5: Supervision    Comments from mat with BUE support      Ambulation/Gait   Ambulation/Gait Yes    Ambulation/Gait Assistance 6: Modified independent (Device/Increase time);5: Supervision    Ambulation/Gait Assistance Details Completed ambulation x 600 ft with Mod I on indoor surfaces and supervisoin on outdoor paved surface with RW. Continued education and cues for improved step length. Improved posture with minimal cues required. AFO donned.    Ambulation Distance (Feet) 600 Feet    Assistive device Rolling walker    Gait Pattern Step-through pattern;Step-to pattern;Decreased stride length;Decreased stance time - right;Decreased step length - left     Ambulation Surface Level;Indoor;Unlevel;Outdoor;Paved    Gait Comments reviewed importance of AFO wear and address any questions/concerns.      Therapeutic Activites    Therapeutic Activities Other Therapeutic Activities    Other Therapeutic Activities Educated on continued HEP compliance and walking program upon d/c. Patient verbalize agreement.                PT Education - 12/21/20 1139     Education Details progress toward LTGs; walking program    Person(s) Educated Patient;Spouse    Methods Explanation    Comprehension Verbalized understanding              PT Short Term Goals - 11/29/20 1231       PT SHORT TERM GOAL #1   Title Patient will obtain AFO and begin report understanding of wear schedule (All STGS Due: 11/25/20)    Baseline AFO not obtained; Recieved L AFO    Time 3    Period Weeks    Status Achieved    Target Date 08/30/20               PT Long Term Goals - 12/21/20 1106       PT LONG TERM GOAL #1   Title Patient will report completing walking program >/= 20 minutes daily (ALL LTG Due: 12/23/20)    Baseline completing approx 10 minutes daily, everyday. completed ambulation daily 10 minutes    Time 6    Period Weeks    Status Not Met      PT LONG TERM GOAL #2   Title Patient will improve 5x sit <> stand to </= 12 secs with BUE support    Baseline 20.31 secs; 14.97 secs; 13.98 seconds    Time 6    Period Weeks    Status Achieved      PT LONG TERM GOAL #3   Title Patient will demo ability to ambulate >/= 200 ft with RW on outdoors surface with AFO and improved upright posture    Baseline ambulation with increased flexed posture and decreased scanning environment; able to scan environment with verbal cues continue to demo flexed posture with 200 ft and use of RW; 637f outdoor with improved posture, intermittent cues and supervision    Time 6    Period Weeks    Status Achieved      PT LONG TERM GOAL #4   Title Patient will report  improved tolerance for wearing AFO all wake hours and report understanding of how to donn/doff device with assistance    Baseline AFO not obtained; reports wearing all wake hours and verbalize understanding to donn/doff    Time 6    Period Weeks  Status Achieved                   Plan - 12/21/20 1143     Clinical Impression Statement Completed assesment of patient's progress toward all LTG. Patient able to meet LTG #2-4 with improvements noted in gait distance, 5x sit <> stand and balance. Patient demo improved balance. Patient able to ambulate with RW outdoors with supervision, and indoors with [facility] at this time. Patient demonstrating readiness to d/c from PT services at this time, with patient agreeable. Patient has made significant progress with PT services.    Personal Factors and Comorbidities Comorbidity 3+    Comorbidities HTN, alcohol abuse (ongoing use), HLD, GERD, PUD, tobacco use    Examination-Activity Limitations Bed Mobility;Dressing;Locomotion Level;Stairs;Stand;Toileting;Transfers;Bathing    Examination-Participation Restrictions Driving;Interpersonal Relationship;Yard Work;Community Activity    Stability/Clinical Decision Making Evolving/Moderate complexity    Rehab Potential Good    PT Frequency 1x / week    PT Duration 8 weeks    PT Treatment/Interventions ADLs/Self Care Home Management;Electrical Stimulation;DME Instruction;Moist Heat;Cryotherapy;Gait training;Stair training;Functional mobility training;Therapeutic activities;Therapeutic exercise;Balance training;Neuromuscular re-education;Patient/family education;Orthotic Fit/Training;Wheelchair mobility training;Passive range of motion;Aquatic Therapy;Vestibular;Manual techniques;Dry needling;Joint Manipulations;Spinal Manipulations    Consulted and Agree with Plan of Care Patient;Family member/caregiver    Family Member Consulted [name]             Patient will benefit from skilled therapeutic  intervention in order to improve the following deficits and impairments:  Abnormal gait, Decreased coordination, Difficulty walking, Impaired tone, Decreased safety awareness, Decreased endurance, Decreased activity tolerance, Pain, Decreased balance, Decreased knowledge of use of DME, Postural dysfunction, Decreased strength, Decreased mobility  Visit Diagnosis: Other abnormalities of gait and mobility  Unsteadiness on feet  Muscle weakness (generalized)  Difficulty in walking, not elsewhere classified     Problem List Patient Active Problem List   Diagnosis Date Noted   H/O ETOH abuse 04/28/2020   History of tobacco abuse 01/07/2020   Hemiparesis affecting left side as late effect of stroke ([place]) 01/07/2020   Abnormality of gait 09/24/2019   AKI (acute kidney injury) ([place])    Fever    Sepsis without acute organ dysfunction (HCC)    Elevated BUN    Blood pressure increase diastolic    Labile blood pressure    Leukopenia    Benign essential HTN    Hypoalbuminemia    Cerebrovascular accident (CVA) of right basal ganglia ([place]) 07/07/2019   Acute ischemic stroke ([place])    Dyslipidemia    Dysphagia, post-stroke    Polycythemia    Stroke ([place]) 06/30/2019   Prediabetes 12/23/2016   Elevated LFTs 12/23/2016   Pure hypercholesterolemia 12/21/2016   Alcoholism ([place]) 12/21/2016   Eczema 09/18/2013   Essential hypertension 09/18/2013   Gastroesophageal reflux disease without esophagitis 09/18/2013   Tobacco abuse 09/18/2013    Jones Bales, PT, DPT 12/21/2020, 11:45 AM  [facility] [address] [place], [name], [address] Phone: [phone]   Fax:  [phone]  Name: Russell Russell MRN: [id number] Date of Birth: 1950-08-01

## 2020-12-26 ENCOUNTER — Ambulatory Visit (INDEPENDENT_AMBULATORY_CARE_PROVIDER_SITE_OTHER): Payer: Medicare HMO

## 2020-12-26 ENCOUNTER — Other Ambulatory Visit: Payer: Self-pay | Admitting: Internal Medicine

## 2020-12-26 ENCOUNTER — Other Ambulatory Visit: Payer: Self-pay

## 2020-12-26 DIAGNOSIS — I6340 Cerebral infarction due to embolism of unspecified cerebral artery: Secondary | ICD-10-CM

## 2020-12-26 MED ORDER — METOPROLOL TARTRATE 25 MG PO TABS: 25.0000 mg | ORAL_TABLET | Freq: Every day | ORAL | 0 refills | Status: DC

## 2020-12-27 LAB — CUP PACEART REMOTE DEVICE CHECK
Date Time Interrogation Session: 20221012230906
Implantable Pulse Generator Implant Date: 20210423

## 2020-12-28 ENCOUNTER — Ambulatory Visit: Payer: Medicare HMO | Admitting: Adult Health

## 2020-12-28 ENCOUNTER — Encounter: Payer: Self-pay | Admitting: Adult Health

## 2020-12-28 ENCOUNTER — Other Ambulatory Visit: Payer: Self-pay

## 2020-12-28 VITALS — BP 128/88 | HR 98 | Ht 70.0 in | Wt 188.0 lb

## 2020-12-28 DIAGNOSIS — G8114 Spastic hemiplegia affecting left nondominant side: Secondary | ICD-10-CM | POA: Diagnosis not present

## 2020-12-28 DIAGNOSIS — F1010 Alcohol abuse, uncomplicated: Secondary | ICD-10-CM | POA: Diagnosis not present

## 2020-12-28 DIAGNOSIS — Z720 Tobacco use: Secondary | ICD-10-CM

## 2020-12-28 DIAGNOSIS — I480 Paroxysmal atrial fibrillation: Secondary | ICD-10-CM

## 2020-12-28 DIAGNOSIS — I639 Cerebral infarction, unspecified: Secondary | ICD-10-CM | POA: Diagnosis not present

## 2020-12-28 DIAGNOSIS — E785 Hyperlipidemia, unspecified: Secondary | ICD-10-CM | POA: Diagnosis not present

## 2020-12-28 NOTE — Progress Notes (Signed)
[facility] [address] [place] [address] [phone]       OFFICE FOLLOW UP NOTE  Russell Russell Date of Birth:  June 02, 1950 Medical Record Number:  [id number]   Referring MD: Rosalin Hawking Reason for Referral: Stroke   Chief Complaint  Patient presents with   Follow-up    RM 3 with spouse Russell Russell  Pt is well and sable, still having L side weakness, slow gait but overall well. No new concerns        HPI:   Update 12/28/2020 JM: Returns for 29-month stroke follow-up accompanied by his wife, Russell Russell.  Overall stable without new stroke/TIA symptoms Recently completed PT meeting all stated rehab goals with residual mild gait impairment with imbalance and left hemiparesis although overall progressive prior visit.  Use of RW for long distance and use of a cane for short distance.  Denies any recent falls.  Continues HEP  Compliant on Xarelto and atorvastatin without side effects Blood pressure today 128/88- monitors at home and has been stable Continue tobacco use approx 1 pack every 1-2 days - has tried nicotine patch previously but irritated skin - he does wish to quit Continued EtOH use with pt reporting 3 per day but wife corrected him stating approx 6 beers per day  No new concerns at this time    History provided for reference purposes only Update 06/28/2020 JM: Russell Russell returns for 6 month follow up accompanied by his wife, Russell Russell from stroke standpoint without new stroke/TIA symptoms Residual deficits of left hemiparesis and gait impairment with imbalance but reports ongoing improvement currently working with PT/OT.  Continue use of rolling walker at all times and denies any recent falls  Loop recorder showed evidence of A. fib in 01/2020 - started on Xarelto as Eliquis not covered by patient's insurance.  He has remained on Xarelto without bleeding or bruising concerns Reports compliance on atorvastatin -denies associated side  effects Blood pressure today 118/80 -monitors at home which has been stable Unfortunately, he has returned back to tobacco and EtOH use currently smoking 1 pack every 3 days and drinking approximately 3-4 beers per day  No further concerns at this time  Update 12/29/2019 JM: Russell Russell returns for 3 months stroke follow-up accompanied by his wife.  He has been doing well since prior visit with residual left-sided weakness, gait impairment and imbalance currently working with PT with continued progress.  Currently ambulating short distances with rolling walker and denies any recent falls.  Denies new or worsening stroke/TIA symptoms.  Continues on aspirin and atorvastatin for secondary stroke prevention without side effects.  Blood pressure today 134/94.  Monitors at home and typically 120-130s/80s.  Loop recorder has not shown atrial fibrillation thus far.  Reports continued tobacco and EtOH cessation.  No concerns at this time.  Consult visit 09/22/2019 Dr. Leonie Man: Russell Russell 70 year old male seen today for initial office consultation visit for stroke in April 2021. History is obtained from the patient and review of electronic medical records and I personally reviewed available imaging films in PACS.  He is a 70 year old male with past medical history of hypertension, hyperlipidemia, tobacco abuse and alcohol abuse who [facility] to [facility]2019 with left-sided weakness did not call for help right away.  And his wife notices in the evening she brought him to the hospital.  His NIH stroke scale was 9.  CT scan of the head showed no acute abnormality.  CT angiogram of  the head and neck showed no large vessel occlusion or stenosis.  There is subtle right internal carotid artery bulb filling defect raising possibility of carotid web versus thrombus.  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  MRI scan showed multiple scattered posterior right basal ganglia, right cerebral, right  cerebellar infarcts with multiple old lacune's in both basal ganglia, thalami, pons and left cerebellum.  Carotid ultrasound showed a focal area of the right ICA bulb possibly a thrombus versus web.  2D echo showed normal ejection fraction.  Lower extremity venous Dopplers was negative for DVT.  LDL cholesterol was 109 mg percent.  Hemoglobin A1c was 6.2.  Patient had loop recorder inserted to look for paroxysmal A. fib.  Patient was discharged on 3 months of aspirin and Plavix followed by aspirin alone.  Patient states he has done well since discharge.  He is quit smoking as well as drinking alcohol.  He is currently undergoing outpatient physical and occupational therapy.  He is walking with a walker but needs a 1 person assist.  He also needs some help with activities of daily living like cutting his food but he can eat himself.  Is tolerating aspirin and Plavix well with only minor bruising.  He has had no recurrent stroke or TIA symptoms.  ROS:   14 system review of systems is positive for those listed in HPI and all other symptoms negative  PMH:  Past Medical History:  Diagnosis Date   Alcohol abuse    drinks 1 gallon of Gin every weekend, nothing during the week x >15 yrs   Elevated transaminase level    +elev bili: abd u/s 06/2014 showed stable small hepatic hemangiomas, o/w normal.   Essential hypertension    GERD (gastroesophageal reflux disease)    Hyperlipidemia 03/2014   Atorv started-chol improved   Impaired fasting glucose 03/2014   Nephrolithiasis    PUD (peptic ulcer disease)    Stroke ([place])    Tobacco dependence    chantix: "psych effects"    Social History:  Social History   Socioeconomic History   Marital status: Married    Spouse name: Not on file   Number of children: Not on file   Years of education: Not on file   Highest education level: Not on file  Occupational History   Not on file  Tobacco Use   Smoking status: Every Day    Packs/day: 0.50    Years:  20.00    Pack years: 10.00    Types: Cigarettes   Smokeless tobacco: Never  Vaping Use   Vaping Use: Never used  Substance and Sexual Activity   Alcohol use: Yes    Alcohol/week: 8.0 standard drinks    Types: 8 Cans of beer per week    Comment: Weekends only   Drug use: No   Sexual activity: Not on file  Other Topics Concern   Not on file  Social History Narrative   Married, no children.   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   Tob: 10 pack-yr hx (current as of 03/2014).   Alcohol: 8 beers and pint of whisky on weekends only.   Denies hx of prob with drugs or alcohol.   Social Determinants of Health   Financial Resource Strain: Not on file  Food Insecurity: Not on file  Transportation Needs: Not on file  Physical Activity: Not on file  Stress: Not on file  Social  Connections: Not on file  Intimate Partner Violence: Not on file    Medications:   Current Outpatient Medications on File Prior to Visit  Medication Sig Dispense Refill   acetaminophen (TYLENOL) 325 MG tablet Take 2 tablets (650 mg total) by mouth every 4 (four) hours as needed for mild pain (or temp > 37.5 C (99.5 F)).     atorvastatin (LIPITOR) 40 MG tablet Take 1 tablet (40 mg total) by mouth daily. 90 tablet 1   folic acid (FOLVITE) 1 MG tablet TAKE 1 TABLET BY MOUTH EVERY DAY 90 tablet 1   halobetasol (ULTRAVATE) 0.05 % cream Apply 1 application topically as needed (skin irritation).     metoprolol tartrate (LOPRESSOR) 25 MG tablet Take 1 tablet (25 mg total) by mouth daily. Please schedule appt with cardiologist in order to receive future refills. Thank you 1st attempt 30 tablet 0   Multiple Vitamin (MULTIVITAMIN WITH MINERALS) TABS tablet Take 1 tablet by mouth daily.     omeprazole (PRILOSEC) 20 MG capsule Take 20 mg by mouth as needed (acid reflux).     rivaroxaban (XARELTO) 20 MG TABS tablet Take 1 tablet (20 mg total) by mouth daily with supper. 10 tablet 1    No current facility-administered medications on file prior to visit.    Allergies:   Allergies  Allergen Reactions   Penicillins Rash    Physical Exam Today's Vitals   12/28/20 0846  BP: 128/88  Pulse: 98  Weight: 188 lb (85.3 kg)  Height: 5\' 10"  (1.778 m)    Body mass index is 26.98 kg/m.   General: well developed, well nourished pleasant middle-aged African-American male, seated, in no evident distress Head: head normocephalic and atraumatic.   Neck: supple with no carotid or supraclavicular bruits Cardiovascular: regular rate and rhythm, no murmurs Musculoskeletal: no deformity Skin:  no rash/petichiae Vascular:  Normal pulses all extremities  Neurologic Exam Mental Status: Awake and fully alert. Mild dysarthria but able to be understood without difficulty.  Follows commands without difficulty.  Oriented to place and date. Recent and remote memory intact.  Attention span, concentration and fund of knowledge intact during visit.  Mood and affect appropriate.  Cranial Nerves: Pupils equal, briskly reactive to light. Extraocular movements full without nystagmus. Visual fields full to confrontation. Hearing intact. Facial sensation intact.  Mild left lower face weakness., tongue, palate moves normally and symmetrically.  Motor: Full strength right upper and lower extremity  LUE: 4+/5 with increased tone LLE: 4+-5/5 with slightly increased tone Sensory.: intact to touch , pinprick , position and vibratory sensation.  Coordination: Rapid alternating movements normal in all extremities except decreased left hand. Finger-to-nose and heel-to-shin performed accurately on right side and mild ataxia on left side. Gait and Station: Stands from seated position without difficulty.  Gait demonstrates decreased LLE step height and stride length with use of rolling walker.  Tandem walk and heel toe not attempted. Reflexes: Brisk LUE and LLE. Toes downgoing.      ASSESSMENT/PLAN:  70 year old male with recent right basal ganglia, and right cerebellum infarcts likely of cryptogenic etiology in 06/2019 s/p loop recorder placement. 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    R BG and cerebellum stroke, cryptogenic -Residual left spastic hemiparesis, dysarthria and gait impairment with imbalance.  Completed PT.  Encouraged continued HEP. Denies any pain associated with spasticity  -Loop recorder showed evidence of atrial fibrillation 01/2020 -Xarelto initiated as Eliquis not covered by insurance -  Continue Xarelto and atorvastatin 40 mg daily for secondary stroke prevention -Discussed secondary stroke prevention measures and importance of close PCP follow-up and maintain strict control of hypertension with blood pressure goal below 130/90 and lipids with LDL cholesterol goal below 70 mg/dL. -Recent A1c 6.4 -Recent LDL 64   Atrial fibrillation -As evidenced on ILR - normal rhythm today -On Xarelto for CHA2DS2-VASc score of 4 managed by cardiology -Eliquis not covered by insurance   EtOH and tobacco use -current use of 1pack/1-2 days and 6 beers/day -hx of alcoholism -Again, highly encouraged complete tobacco and EtOH cessation for secondary stroke prevention and overall health and especially limiting to no alcohol as he is on [facility] -advised him that this increases bleeding risk.  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  This included previsit chart review, lab review, study review, electronic health record documentation, patient and wife discussion and education regarding prior stroke, residual deficits, A. fib and use of AC, importance of managing stroke risk factors, current use of EtOH and tobacco use and answered all other questions to patient and wife satisfaction  Frann Rider, AGNP-BC  [facility] [address] [place], [place] [address]  Phone [phone] Fax [phone] Note: This document was prepared with digital dictation and possible smart phrase technology. Any transcriptional errors that result from this process are unintentional.

## 2020-12-28 NOTE — Patient Instructions (Signed)
Continue Xarelto (rivaroxaban) daily  and atorvastatin 40mg  daily  for secondary stroke prevention  Continue to follow up with PCP regarding cholesterol, blood pressure and diabetes management  Maintain strict control of hypertension with blood pressure goal below 130/90, diabetes with hemoglobin A1c goal below 7.0% and cholesterol with LDL cholesterol (bad cholesterol) goal below 70 mg/dL.   Continue exercises at home for hopeful ongoing recovery     Followup in the future with me in 6 months or call earlier if needed       Thank you for coming to see [place] at [facility]. I hope we have been able to provide you high quality care today.  You may receive a patient satisfaction survey over the next few weeks. We would appreciate your feedback and comments so that we may continue to improve ourselves and the health of our patients.

## 2021-01-04 NOTE — Progress Notes (Signed)
[facility]link Summary Report / Loop Recorder 

## 2021-01-23 ENCOUNTER — Encounter: Payer: Self-pay | Admitting: Registered Nurse

## 2021-01-23 ENCOUNTER — Other Ambulatory Visit: Payer: Self-pay

## 2021-01-23 ENCOUNTER — Encounter: Payer: Medicare HMO | Attending: Physical Medicine & Rehabilitation | Admitting: Registered Nurse

## 2021-01-23 VITALS — BP 131/94 | HR 85 | Temp 97.9°F | Ht 70.0 in | Wt 187.0 lb

## 2021-01-23 DIAGNOSIS — I69354 Hemiplegia and hemiparesis following cerebral infarction affecting left non-dominant side: Secondary | ICD-10-CM | POA: Diagnosis not present

## 2021-01-23 DIAGNOSIS — R269 Unspecified abnormalities of gait and mobility: Secondary | ICD-10-CM | POA: Insufficient documentation

## 2021-01-23 DIAGNOSIS — I6381 Other cerebral infarction due to occlusion or stenosis of small artery: Secondary | ICD-10-CM | POA: Insufficient documentation

## 2021-01-23 NOTE — Progress Notes (Signed)
Subjective:    Patient ID: Russell Russell, male    DOB: [birth year], 70 y.o.   MRN: [id number]  HPI: Russell Russell is a 70 y.o. male who returns for follow up appointment for of his Left side hemiparesis secondary to multiple scattered anterior and posterior right brain infarcts. He denies any pain. He rates his pain 0. His current exercise regime is  walking with walker, he has completed his physical therapy, he is walking with his walker. He denies any pain. He rates his pain 0. His current exercise regime is walking and performing stretching exercises.    Pain Inventory Average Pain 0 Pain Right Now 0 My pain is  no pain  LOCATION OF PAIN  no pain  BOWEL Number of stools per week: 5 Oral laxative use No    BLADDER Normal  Mobility walk without assistance use a cane use a walker how many minutes can you walk? 20 minutes ability to climb steps?  yes Do you have any goals in this area?  yes  Function retired Do you have any goals in this area?  yes  Neuro/Psych weakness trouble walking  Prior Studies Any changes since last visit?  no  Physicians involved in your care Any changes since last visit?  no   Family History  Problem Relation Age of Onset   Colon cancer Mother    Cancer Father    Breast cancer Sister    Breast cancer Sister    Social History   Socioeconomic History   Marital status: Married    Spouse name: Not on file   Number of children: Not on file   Years of education: Not on file   Highest education level: Not on file  Occupational History   Not on file  Tobacco Use   Smoking status: Every Day    Packs/day: 0.50    Years: 20.00    Pack years: 10.00    Types: Cigarettes   Smokeless tobacco: Never  Vaping Use   Vaping Use: Never used  Substance and Sexual Activity   Alcohol use: Yes    Alcohol/week: 8.0 standard drinks    Types: 8 Cans of beer per week    Comment: Weekends only   Drug use: No   Sexual activity: Not on file   Other Topics Concern   Not on file  Social History Narrative   Married, no children.   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   Tob: 10 pack-yr hx (current as of 03/2014).   Alcohol: 8 beers and pint of whisky on weekends only.   Denies hx of prob with drugs or alcohol.   Social Determinants of Health   Financial Resource Strain: Not on file  Food Insecurity: Not on file  Transportation Needs: Not on file  Physical Activity: Not on file  Stress: Not on file  Social Connections: Not on file   Past Surgical History:  Procedure Laterality Date   COLONOSCOPY  2005   Recall 10 yrs ([place])   LOOP RECORDER INSERTION N/A 07/03/2019   Procedure: LOOP RECORDER INSERTION;  Surgeon: Thompson Grayer, MD;  Location: [facility];  Service: Cardiovascular;  Laterality: N/A;   removal of kidney stones  2006   cystoscopic--removed from ureter.  No prob since.   Past Medical History:  Diagnosis Date   Alcohol abuse    drinks 1 gallon of Gin every weekend, nothing during the week x >15  yrs   Elevated transaminase level    +elev bili: abd u/s 06/2014 showed stable small hepatic hemangiomas, o/w normal.   Essential hypertension    GERD (gastroesophageal reflux disease)    Hyperlipidemia 03/2014   Atorv started-chol improved   Impaired fasting glucose 03/2014   Nephrolithiasis    PUD (peptic ulcer disease)    Stroke (HCC)    Tobacco dependence    chantix: "psych effects"   BP (!) 131/94   Pulse 85   Temp 97.9 F (36.6 C)   Ht 5\' 10"  (1.778 m)   Wt 187 lb (84.8 kg)   SpO2 99%   BMI 26.83 kg/m   Opioid Risk Score:   Fall Risk Score:  `1  Depression screen PHQ 2/9  Depression screen [facility] 2/9 01/23/2021 10/25/2020 10/21/2020 07/26/2020 04/28/2020 01/07/2020 08/18/2019  Decreased Interest 0 0 0 0 0 0 0  Down, Depressed, Hopeless 0 0 0 0 0 0 0  PHQ - 2 Score 0 0 0 0 0 0 0  Altered sleeping - - - - - - -  Tired, decreased energy - - - -  - - -  Change in appetite - - - - - - -  Feeling bad or failure about yourself  - - - - - - -  Trouble concentrating - - - - - - -  Moving slowly or fidgety/restless - - - - - - -  Suicidal thoughts - - - - - - -  PHQ-9 Score - - - - - - -  Some recent data might be hidden    Review of Systems  Musculoskeletal:  Positive for gait problem.  Neurological:  Positive for weakness.  All other systems reviewed and are negative.     Objective:   Physical Exam Vitals and nursing note reviewed.  Constitutional:      Appearance: Normal appearance.  Cardiovascular:     Rate and Rhythm: Normal rate and regular rhythm.     Pulses: Normal pulses.     Heart sounds: Normal heart sounds.  Pulmonary:     Effort: Pulmonary effort is normal.     Breath sounds: Normal breath sounds.  Musculoskeletal:     Cervical back: Normal range of motion and neck supple.     Comments: Normal Muscle Bulk and Muscle Testing Reveals:  Upper Extremities:Full  ROM and Muscle Strength on Right 5/5 and Left 4/5 Lower Extremities Full ROM and Muscle Strength 5/5 Wearing Left AFO  Arises from Table slowly using walker for support Narrow Based Gait     Skin:    General: Skin is warm and dry.  Neurological:     Mental Status: He is alert and oriented to person, place, and time.  Psychiatric:        Mood and Affect: Mood normal.        Behavior: Behavior normal.         Assessment & Plan:  1.  Left side hemiparesis secondary to multiple scattered anterior and posterior right brain infarcts.  Neurology Following. He completed his outpatient Physical Therapy with [facility].  Wearing Left AFO today. 01/23/2021 2.  History of tobacco and alcohol use.: Continue with  Smoking Cessation and he verbalizes understanding.  Continue to monitor.  01/23/2021 4. Gait abnormality: Continue to use walker for support at all times. Continue with Rolator for Safety.01/23/2021  F/U in 6 months

## 2021-01-25 LAB — CUP PACEART REMOTE DEVICE CHECK
Date Time Interrogation Session: 20221114230735
Implantable Pulse Generator Implant Date: 20210423

## 2021-01-30 ENCOUNTER — Ambulatory Visit (INDEPENDENT_AMBULATORY_CARE_PROVIDER_SITE_OTHER): Payer: Medicare HMO

## 2021-01-30 DIAGNOSIS — I6340 Cerebral infarction due to embolism of unspecified cerebral artery: Secondary | ICD-10-CM | POA: Diagnosis not present

## 2021-02-08 NOTE — Progress Notes (Signed)
[facility]link Summary Report / Loop Recorder 

## 2021-02-17 ENCOUNTER — Ambulatory Visit (INDEPENDENT_AMBULATORY_CARE_PROVIDER_SITE_OTHER): Payer: Medicare HMO

## 2021-02-17 DIAGNOSIS — Z0000 Encounter for general adult medical examination without abnormal findings: Secondary | ICD-10-CM

## 2021-02-17 NOTE — Patient Instructions (Signed)
Russell Russell , Thank you for taking time to come for your Medicare Wellness Visit. I appreciate your ongoing commitment to your health goals. Please review the following plan we discussed and let me know if I can assist you in the future.   Screening recommendations/referrals: Colonoscopy: Cologuard 12/19/20 repeat every 3 years  Recommended yearly ophthalmology/optometry visit for glaucoma screening and checkup Recommended yearly dental visit for hygiene and checkup  Vaccinations: Influenza vaccine: Declined  Pneumococcal vaccine: Dus and discussed  Tdap vaccine: Due  Shingles vaccine: Shingrix discussed. Please contact your pharmacy for coverage information.    Covid-19: declined and discussed   Advanced directives: Advance directive discussed with you today. I have provided a copy for you to complete at home and have notarized. Once this is complete please bring a copy in to our office so we can scan it into your chart.  Conditions/risks identified: None at this time   Next appointment: Follow up in one year for your annual wellness visit.   Preventive Care 69 Years and Older, Male Preventive care refers to lifestyle choices and visits with your health care provider that can promote health and wellness. What does preventive care include? A yearly physical exam. This is also called an annual well check. Dental exams once or twice a year. Routine eye exams. Ask your health care provider how often you should have your eyes checked. Personal lifestyle choices, including: Daily care of your teeth and gums. Regular physical activity. Eating a healthy diet. Avoiding tobacco and drug use. Limiting alcohol use. Practicing safe sex. Taking low doses of aspirin every day. Taking vitamin and mineral supplements as recommended by your health care provider. What happens during an annual well check? The services and screenings done by your health care provider during your annual well check  will depend on your age, overall health, lifestyle risk factors, and family history of disease. Counseling  Your health care provider may ask you questions about your: Alcohol use. Tobacco use. Drug use. Emotional well-being. Home and relationship well-being. Sexual activity. Eating habits. History of falls. Memory and ability to understand (cognition). Work and work Statistician. Screening  You may have the following tests or measurements: Height, weight, and BMI. Blood pressure. Lipid and cholesterol levels. These may be checked every 5 years, or more frequently if you are over 8 years old. Skin check. Lung cancer screening. You may have this screening every year starting at age 69 if you have a 30-pack-year history of smoking and currently smoke or have quit within the past 15 years. Fecal occult blood test (FOBT) of the stool. You may have this test every year starting at age 69. Flexible sigmoidoscopy or colonoscopy. You may have a sigmoidoscopy every 5 years or a colonoscopy every 10 years starting at age 69. Prostate cancer screening. Recommendations will vary depending on your family history and other risks. Hepatitis C blood test. Hepatitis B blood test. Sexually transmitted disease (STD) testing. Diabetes screening. This is done by checking your blood sugar (glucose) after you have not eaten for a while (fasting). You may have this done every 1-3 years. Abdominal aortic aneurysm (AAA) screening. You may need this if you are a current or former smoker. Osteoporosis. You may be screened starting at age 69 if you are at high risk. Talk with your health care provider about your test results, treatment options, and if necessary, the need for more tests. Vaccines  Your health care provider may recommend certain vaccines, such as: Influenza vaccine.  This is recommended every year. Tetanus, diphtheria, and acellular pertussis (Tdap, Td) vaccine. You may need a Td booster every 10  years. Zoster vaccine. You may need this after age 56. Pneumococcal 13-valent conjugate (PCV13) vaccine. One dose is recommended after age 69. Pneumococcal polysaccharide (PPSV23) vaccine. One dose is recommended after age 69. Talk to your health care provider about which screenings and vaccines you need and how often you need them. This information is not intended to replace advice given to you by your health care provider. Make sure you discuss any questions you have with your health care provider. Document Released: 03/25/2015 Document Revised: 11/16/2015 Document Reviewed: 12/28/2014 Elsevier Interactive Patient Education  2017 [employer] Prevention in the Home Falls can cause injuries. They can happen to people of all ages. There are many things you can do to make your home safe and to help prevent falls. What can I do on the outside of my home? Regularly fix the edges of walkways and driveways and fix any cracks. Remove anything that might make you trip as you walk through a door, such as a raised step or threshold. Trim any bushes or trees on the path to your home. Use bright outdoor lighting. Clear any walking paths of anything that might make someone trip, such as rocks or tools. Regularly check to see if handrails are loose or broken. Make sure that both sides of any steps have handrails. Any raised decks and porches should have guardrails on the edges. Have any leaves, snow, or ice cleared regularly. Use sand or salt on walking paths during winter. Clean up any spills in your garage right away. This includes oil or grease spills. What can I do in the bathroom? Use night lights. Install grab bars by the toilet and in the tub and shower. Do not use towel bars as grab bars. Use non-skid mats or decals in the tub or shower. If you need to sit down in the shower, use a plastic, non-slip stool. Keep the floor dry. Clean up any water that spills on the floor as soon as it  happens. Remove soap buildup in the tub or shower regularly. Attach bath mats securely with double-sided non-slip rug tape. Do not have throw rugs and other things on the floor that can make you trip. What can I do in the bedroom? Use night lights. Make sure that you have a light by your bed that is easy to reach. Do not use any sheets or blankets that are too big for your bed. They should not hang down onto the floor. Have a firm chair that has side arms. You can use this for support while you get dressed. Do not have throw rugs and other things on the floor that can make you trip. What can I do in the kitchen? Clean up any spills right away. Avoid walking on wet floors. Keep items that you use a lot in easy-to-reach places. If you need to reach something above you, use a strong step stool that has a grab bar. Keep electrical cords out of the way. Do not use floor polish or wax that makes floors slippery. If you must use wax, use non-skid floor wax. Do not have throw rugs and other things on the floor that can make you trip. What can I do with my stairs? Do not leave any items on the stairs. Make sure that there are handrails on both sides of the stairs and use them. Fix handrails  that are broken or loose. Make sure that handrails are as long as the stairways. Check any carpeting to make sure that it is firmly attached to the stairs. Fix any carpet that is loose or worn. Avoid having throw rugs at the top or bottom of the stairs. If you do have throw rugs, attach them to the floor with carpet tape. Make sure that you have a light switch at the top of the stairs and the bottom of the stairs. If you do not have them, ask someone to add them for you. What else can I do to help prevent falls? Wear shoes that: Do not have high heels. Have rubber bottoms. Are comfortable and fit you well. Are closed at the toe. Do not wear sandals. If you use a stepladder: Make sure that it is fully opened.  Do not climb a closed stepladder. Make sure that both sides of the stepladder are locked into place. Ask someone to hold it for you, if possible. Clearly mark and make sure that you can see: Any grab bars or handrails. First and last steps. Where the edge of each step is. Use tools that help you move around (mobility aids) if they are needed. These include: Canes. Walkers. Scooters. Crutches. Turn on the lights when you go into a dark area. Replace any light bulbs as soon as they burn out. Set up your furniture so you have a clear path. Avoid moving your furniture around. If any of your floors are uneven, fix them. If there are any pets around you, be aware of where they are. Review your medicines with your doctor. Some medicines can make you feel dizzy. This can increase your chance of falling. Ask your doctor what other things that you can do to help prevent falls. This information is not intended to replace advice given to you by your health care provider. Make sure you discuss any questions you have with your health care provider. Document Released: 12/23/2008 Document Revised: 08/04/2015 Document Reviewed: 04/02/2014 Elsevier Interactive Patient Education  2017 [employer].

## 2021-02-17 NOTE — Progress Notes (Signed)
Virtual Visit via Telephone Note  I connected with  [name] on 02/17/21 at  2:30 PM EST by telephone and verified that I am speaking with the correct person using two identifiers.  Medicare Annual Wellness visit completed telephonically due to Covid-19 pandemic.   Persons participating in this call: This Health Coach and this patient.   Location: Patient: Home Provider: Office    I discussed the limitations, risks, security and privacy concerns of performing an evaluation and management service by telephone and the availability of in person appointments. The patient expressed understanding and agreed to proceed.  Unable to perform video visit due to video visit attempted and failed and/or patient does not have video capability.   Some vital signs may be absent or patient reported.   Willette Brace, LPN   Subjective:   Yitzchok Carriger is a 70 y.o. male who presents for an Initial Medicare Annual Wellness Visit.  Review of Systems     Cardiac Risk Factors include: advanced age (>32men, >71 women);hypertension;male gender;dyslipidemia;smoking/ tobacco exposure     Objective:    There were no vitals filed for this visit. There is no height or weight on file to calculate BMI.  Advanced Directives 02/17/2021 06/07/2020 08/06/2019 07/07/2019 07/02/2019 06/30/2019 06/30/2018  Does Patient Have a Medical Advance Directive? No No No No - No Yes  Type of Advance Directive - - - - - - [place]  Would patient like information on creating a medical advance directive? Yes (MAU/Ambulatory/Procedural Areas - Information given) No - Patient declined Yes (MAU/Ambulatory/Procedural Areas - Information given) No - Patient declined No - Patient declined - -    Current Medications (verified) Outpatient Encounter Medications as of 02/17/2021  Medication Sig   acetaminophen (TYLENOL) 325 MG tablet Take 2 tablets (650 mg total) by mouth every 4 (four) hours as needed for mild  pain (or temp > 37.5 C (99.5 F)).   atorvastatin (LIPITOR) 40 MG tablet Take 1 tablet (40 mg total) by mouth daily.   folic acid (FOLVITE) 1 MG tablet TAKE 1 TABLET BY MOUTH EVERY DAY   halobetasol (ULTRAVATE) 0.05 % cream Apply 1 application topically as needed (skin irritation).   metoprolol tartrate (LOPRESSOR) 25 MG tablet Take 1 tablet (25 mg total) by mouth daily. Please schedule appt with cardiologist in order to receive future refills. Thank you 1st attempt   Multiple Vitamin (MULTIVITAMIN WITH MINERALS) TABS tablet Take 1 tablet by mouth daily.   omeprazole (PRILOSEC) 20 MG capsule Take 20 mg by mouth as needed (acid reflux).   rivaroxaban (XARELTO) 20 MG TABS tablet Take 1 tablet (20 mg total) by mouth daily with supper.   No facility-administered encounter medications on file as of 02/17/2021.    Allergies (verified) Penicillins   History: Past Medical History:  Diagnosis Date   Alcohol abuse    drinks 1 gallon of Gin every weekend, nothing during the week x >15 yrs   Elevated transaminase level    +elev bili: abd u/s 06/2014 showed stable small hepatic hemangiomas, o/w normal.   Essential hypertension    GERD (gastroesophageal reflux disease)    Hyperlipidemia 03/2014   Atorv started-chol improved   Impaired fasting glucose 03/2014   Nephrolithiasis    PUD (peptic ulcer disease)    Stroke ([place])    Tobacco dependence    chantix: "psych effects"   Past Surgical History:  Procedure Laterality Date   COLONOSCOPY  2005   Recall 10 yrs ([place])  LOOP RECORDER INSERTION N/A 07/03/2019   Procedure: LOOP RECORDER INSERTION;  Surgeon: Thompson Grayer, MD;  Location: [facility];  Service: Cardiovascular;  Laterality: N/A;   removal of kidney stones  2006   cystoscopic--removed from ureter.  No prob since.   Family History  Problem Relation Age of Onset   Colon cancer Mother    Cancer Father    Breast cancer Sister    Breast cancer Sister    Social History    Socioeconomic History   Marital status: Married    Spouse name: Not on file   Number of children: Not on file   Years of education: Not on file   Highest education level: Not on file  Occupational History   Not on file  Tobacco Use   Smoking status: Every Day    Packs/day: 0.50    Years: 20.00    Pack years: 10.00    Types: Cigarettes   Smokeless tobacco: Never  Vaping Use   Vaping Use: Never used  Substance and Sexual Activity   Alcohol use: Yes    Alcohol/week: 8.0 standard drinks    Types: 8 Cans of beer per week    Comment: Weekends only   Drug use: No   Sexual activity: Not on file  Other Topics Concern   Not on file  Social History Narrative   Married, no children.   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   Tob: 10 pack-yr hx (current as of 03/2014).   Alcohol: 8 beers and pint of whisky on weekends only.   Denies hx of prob with drugs or alcohol.   Social Determinants of Health   Financial Resource Strain: Low Risk    Difficulty of Paying Living Expenses: Not hard at all  Food Insecurity: No Food Insecurity   Worried About Charity fundraiser in the Last Year: Never true   [place] in the Last Year: Never true  Transportation Needs: No Transportation Needs   Lack of Transportation (Medical): No   Lack of Transportation (Non-Medical): No  Physical Activity: Inactive   Days of Exercise per Week: 0 days   Minutes of Exercise per Session: 0 min  Stress: No Stress Concern Present   Feeling of Stress : Not at all  Social Connections: Moderately Integrated   Frequency of Communication with Friends and Family: More than three times a week   Frequency of Social Gatherings with Friends and Family: More than three times a week   Attends Religious Services: More than 4 times per year   Active Member of [employer] or Organizations: No   Attends Music therapist: Never   Marital Status: Married     Tobacco Counseling Ready to quit: Not Answered Counseling given: Not Answered   Clinical Intake:  Pre-visit preparation completed: Yes  Pain : No/denies pain     BMI - recorded: 26.83 Nutritional Status: BMI 25 -29 Overweight Nutritional Risks: None Diabetes: No  How often do you need to have someone help you when you read instructions, pamphlets, or other written materials from your doctor or pharmacy?: 1 - Never  Diabetic?no   Interpreter Needed?: No  Information entered by :: Charlott Rakes, LPN   Activities of Daily Living In your present state of health, do you have any difficulty performing the following activities: 02/17/2021 10/25/2020  Hearing? N N  Vision? N N  Difficulty concentrating or making decisions? N N  Walking or climbing stairs? N Y  Comment - uses a walker  Dressing or bathing? N N  Doing errands, shopping? N Y  Comment - Pt can not drive  Preparing Food and eating ? N -  Using the Toilet? N -  In the past six months, have you accidently leaked urine? N -  Do you have problems with loss of bowel control? N -  Managing your Medications? N -  Managing your Finances? N -  Housekeeping or managing your Housekeeping? N -  Some recent data might be hidden    Patient Care Team: Inda Coke, [place] as PCP - General (Physician Assistant)  Indicate any recent Medical Services you may have received from other than [facility] providers in the past year (date may be approximate).     Assessment:   This is a routine wellness examination for [place].  Hearing/Vision screen Hearing Screening - Comments:: Pt denies any hearing issues  Vision Screening - Comments:: [facility]   Dietary issues and exercise activities discussed: Current Exercise Habits: The patient does not participate in regular exercise at present   Goals Addressed             This Visit's Progress    Patient Stated       None at this time         Depression Screen PHQ 2/9 Scores 02/17/2021 01/23/2021 10/25/2020 10/21/2020 07/26/2020 04/28/2020 01/07/2020  PHQ - 2 Score 0 0 0 0 0 0 0  PHQ- 9 Score - - - - - - -    Fall Risk Fall Risk  02/17/2021 01/23/2021 10/21/2020 07/26/2020 07/26/2020  Falls in the past year? 0 0 0 0 0  Number falls in past yr: 0 0 0 0 0  Injury with Fall? 0 0 0 0 0  Risk for fall due to : Impaired balance/gait;Impaired vision - - - -  Risk for fall due to: Comment use walker - - - -  Follow up Falls prevention discussed - - - -    FALL RISK PREVENTION PERTAINING TO THE HOME:  Any stairs in or around the home? Yes  If so, are there any without handrails? No  Home free of loose throw rugs in walkways, pet beds, electrical cords, etc? Yes  Adequate lighting in your home to reduce risk of falls? Yes   ASSISTIVE DEVICES UTILIZED TO PREVENT FALLS:  Life alert? No  Use of a cane, walker or w/c? Yes  Grab bars in the bathroom? No  Shower chair or bench in shower? Yes  Elevated toilet seat or a handicapped toilet? No   TIMED UP AND GO:  Was the test performed? No .   Cognitive Function:     6CIT Screen 02/17/2021  What Year? 0 points  What month? 0 points  What time? 0 points  Count back from 20 0 points  Months in reverse 0 points  Repeat phrase 0 points  Total Score 0    Immunizations Immunization History  Administered Date(s) Administered   Tdap 03/12/2006    TDAP status: Due, Education has been provided regarding the importance of this vaccine. Advised may receive this vaccine at local pharmacy or Health Dept. Aware to provide a copy of the vaccination record if obtained from local pharmacy or Health Dept. Verbalized acceptance and understanding.  Flu Vaccine status: Declined, Education has been provided regarding the importance of this vaccine but patient still declined. Advised may receive this vaccine at  local pharmacy or Health Dept. Aware to provide a copy of the vaccination record if  obtained from local pharmacy or Health Dept. Verbalized acceptance and understanding.  Pneumococcal vaccine status: Due, Education has been provided regarding the importance of this vaccine. Advised may receive this vaccine at local pharmacy or Health Dept. Aware to provide a copy of the vaccination record if obtained from local pharmacy or Health Dept. Verbalized acceptance and understanding.  Covid-19 vaccine status: Declined, Education has been provided regarding the importance of this vaccine but patient still declined. Advised may receive this vaccine at local pharmacy or Health Dept.or vaccine clinic. Aware to provide a copy of the vaccination record if obtained from local pharmacy or Health Dept. Verbalized acceptance and understanding.  Qualifies for Shingles Vaccine? Yes   Zostavax completed No   Shingrix Completed?: No.    Education has been provided regarding the importance of this vaccine. Patient has been advised to call insurance company to determine out of pocket expense if they have not yet received this vaccine. Advised may also receive vaccine at local pharmacy or Health Dept. Verbalized acceptance and understanding.  Screening Tests Health Maintenance  Topic Date Due   Zoster Vaccines- Shingrix (1 of 2) Never done   TETANUS/TDAP  10/25/2021 (Originally 03/12/2016)   Pneumonia Vaccine 31+ Years old (1 - PCV) 02/17/2022 (Originally 12/21/1956)   Fecal DNA (Cologuard)  12/16/2023   Hepatitis C Screening  Completed   HPV VACCINES  Aged Out   INFLUENZA VACCINE  Discontinued   COLONOSCOPY (Pts 45-68yrs Insurance coverage will need to be confirmed)  Discontinued   COVID-19 Vaccine  Discontinued    Health Maintenance  Health Maintenance Due  Topic Date Due   Zoster Vaccines- Shingrix (1 of 2) Never done    Colorectal cancer screening: Type of screening: Cologuard. Completed 12/15/20. Repeat every 3 years  Lung Cancer Screening: (Low Dose CT Chest recommended if Age 33-80  years, 30 pack-year currently smoking OR have quit w/in 15years.) does qualify.   Lung Cancer Screening Referral: Ambulatory Referral Lung Cancer Screening [facility] sent to Doroteo Glassman    Additional Screening:  Hepatitis C Screening:  Completed 07/24/19  Vision Screening: Recommended annual ophthalmology exams for early detection of glaucoma and other disorders of the eye. Is the patient up to date with their annual eye exam?  Yes  Who is the provider or what is the name of the office in which the patient attends annual eye exams? [facility]  If pt is not established with a provider, would they like to be referred to a provider to establish care? No .   Dental Screening: Recommended annual dental exams for proper oral hygiene  Community Resource Referral / Chronic Care Management: CRR required this visit?  No   CCM required this visit?  No      Plan:     I have personally reviewed and noted the following in the patient's chart:   Medical and social history Use of alcohol, tobacco or illicit drugs  Current medications and supplements including opioid prescriptions. Patient is not currently taking opioid prescriptions. Functional ability and status Nutritional status Physical activity Advanced directives List of other physicians Hospitalizations, surgeries, and ER visits in previous 12 months Vitals Screenings to include cognitive, depression, and falls Referrals and appointments  In addition, I have reviewed and discussed with patient certain preventive protocols, quality metrics, and best practice recommendations. A written personalized care plan for preventive services as well as general preventive health  recommendations were provided to patient.     Willette Brace, LPN   33/08/1222   Nurse Notes: None

## 2021-03-02 LAB — CUP PACEART REMOTE DEVICE CHECK
Date Time Interrogation Session: 20221217230906
Implantable Pulse Generator Implant Date: 20210423

## 2021-03-07 ENCOUNTER — Ambulatory Visit (INDEPENDENT_AMBULATORY_CARE_PROVIDER_SITE_OTHER): Payer: Medicare HMO

## 2021-03-07 DIAGNOSIS — I6340 Cerebral infarction due to embolism of unspecified cerebral artery: Secondary | ICD-10-CM

## 2021-03-08 ENCOUNTER — Other Ambulatory Visit: Payer: Self-pay | Admitting: Family Medicine

## 2021-03-16 NOTE — Progress Notes (Signed)
[facility]link Summary Report / Loop Recorder 

## 2021-03-29 ENCOUNTER — Other Ambulatory Visit: Payer: Self-pay | Admitting: Internal Medicine

## 2021-03-29 NOTE — Telephone Encounter (Signed)
Prescription refill request for Xarelto received. Indication: stroke, afib Last office visit: 01/27/2020, [name] Scr: 1.28, 10/25/2020 Age: 71 yo Weight: 84.8 kg CrCl: 73ml/min   Called and spoke to pt. Made him aware that I would send in refill so that he does no run out. However, he will need make an appointment to see the cardiologist. Transferred pt to make an appointment. Will send in refill so pt does not run out.   Pt now scheduled to see Oda Kilts on 1/30

## 2021-04-10 ENCOUNTER — Other Ambulatory Visit: Payer: Self-pay

## 2021-04-10 ENCOUNTER — Ambulatory Visit: Payer: Medicare HMO | Admitting: Student

## 2021-04-10 ENCOUNTER — Ambulatory Visit (INDEPENDENT_AMBULATORY_CARE_PROVIDER_SITE_OTHER): Payer: Medicare HMO

## 2021-04-10 ENCOUNTER — Encounter: Payer: Self-pay | Admitting: Student

## 2021-04-10 VITALS — BP 118/78 | HR 84 | Ht 70.0 in | Wt 181.0 lb

## 2021-04-10 DIAGNOSIS — I639 Cerebral infarction, unspecified: Secondary | ICD-10-CM

## 2021-04-10 DIAGNOSIS — I6340 Cerebral infarction due to embolism of unspecified cerebral artery: Secondary | ICD-10-CM | POA: Diagnosis not present

## 2021-04-10 DIAGNOSIS — I10 Essential (primary) hypertension: Secondary | ICD-10-CM

## 2021-04-10 LAB — CUP PACEART REMOTE DEVICE CHECK
Date Time Interrogation Session: 20230129231957
Implantable Pulse Generator Implant Date: 20210423

## 2021-04-10 NOTE — Progress Notes (Signed)
Electrophysiology Office Note Date: 04/10/2021  ID:  Russell Russell, DOB Nov 22, 1950, MRN [id number]  PCP: Inda Coke, PA Primary Cardiologist: None Electrophysiologist: Thompson Grayer, MD   CC: ILR follow-up  Russell Russell is a 71 y.o. male seen today for Dr. Rayann Heman . he presents today for routine electrophysiology followup.  Since last being seen in our clinic, the patient reports doing very well.  he denies chest pain, palpitations, dyspnea, PND, orthopnea, nausea, vomiting, dizziness, syncope, edema, weight gain, or early satiety.  Device History: Medtronic loop recorder implanted 2021 for Cryptogenic Stroke  Past Medical History:  Diagnosis Date   Alcohol abuse    drinks 1 gallon of Gin every weekend, nothing during the week x >15 yrs   Elevated transaminase level    +elev bili: abd u/s 06/2014 showed stable small hepatic hemangiomas, o/w normal.   Essential hypertension    GERD (gastroesophageal reflux disease)    Hyperlipidemia 03/2014   Atorv started-chol improved   Impaired fasting glucose 03/2014   Nephrolithiasis    PUD (peptic ulcer disease)    Stroke (Russell Russell)    Tobacco dependence    chantix: "psych effects"   Past Surgical History:  Procedure Laterality Date   COLONOSCOPY  2005   Recall 10 yrs ([place])   LOOP RECORDER INSERTION N/A 07/03/2019   Procedure: LOOP RECORDER INSERTION;  Surgeon: Thompson Grayer, MD;  Location: [facility];  Service: Cardiovascular;  Laterality: N/A;   removal of kidney stones  2006   cystoscopic--removed from ureter.  No prob since.    Current Outpatient Medications  Medication Sig Dispense Refill   acetaminophen (TYLENOL) 325 MG tablet Take 2 tablets (650 mg total) by mouth every 4 (four) hours as needed for mild pain (or temp > 37.5 C (99.5 F)).     atorvastatin (LIPITOR) 40 MG tablet Take 1 tablet (40 mg total) by mouth daily. 90 tablet 1   folic acid (FOLVITE) 1 MG tablet TAKE 1 TABLET BY MOUTH EVERY DAY 90 tablet  1   halobetasol (ULTRAVATE) 0.05 % cream Apply 1 application topically as needed (skin irritation).     metoprolol tartrate (LOPRESSOR) 25 MG tablet Take 1 tablet (25 mg total) by mouth daily. Pt needs to make appt with provider for further refills - 1st attempt 30 tablet 0   Multiple Vitamin (MULTIVITAMIN WITH MINERALS) TABS tablet Take 1 tablet by mouth daily.     omeprazole (PRILOSEC) 20 MG capsule Take 20 mg by mouth as needed (acid reflux).     rivaroxaban (XARELTO) 20 MG TABS tablet TAKE 1 TABLET EVERY DAY WITH SUPPER. 90 tablet 0   No current facility-administered medications for this visit.    Allergies:   Penicillins   Social History: Social History   Socioeconomic History   Marital status: Married    Spouse name: Not on file   Number of children: Not on file   Years of education: Not on file   Highest education level: Not on file  Occupational History   Not on file  Tobacco Use   Smoking status: Every Day    Packs/day: 0.50    Years: 20.00    Pack years: 10.00    Types: Cigarettes   Smokeless tobacco: Never  Vaping Use   Vaping Use: Never used  Substance and Sexual Activity   Alcohol use: Yes    Alcohol/week: 8.0 standard drinks    Types: 8 Cans of beer per week    Comment: Weekends only  Drug use: No   Sexual activity: Not on file  Other Topics Concern   Not on file  Social History Narrative   Married, no children.   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   Tob: 10 pack-yr hx (current as of 03/2014).   Alcohol: 8 beers and pint of whisky on weekends only.   Denies hx of prob with drugs or alcohol.   Social Determinants of Health   Financial Resource Strain: Low Risk    Difficulty of Paying Living Expenses: Not hard at all  Food Insecurity: No Food Insecurity   Worried About Charity fundraiser in the Last Year: Never true   [place] in the Last Year: Never true  Transportation Needs: No  Transportation Needs   Lack of Transportation (Medical): No   Lack of Transportation (Non-Medical): No  Physical Activity: Inactive   Days of Exercise per Week: 0 days   Minutes of Exercise per Session: 0 min  Stress: No Stress Concern Present   Feeling of Stress : Not at all  Social Connections: Moderately Integrated   Frequency of Communication with Friends and Family: More than three times a week   Frequency of Social Gatherings with Friends and Family: More than three times a week   Attends Religious Services: More than 4 times per year   Active Member of [employer] or Organizations: No   Attends Music therapist: Never   Marital Status: Married  Human resources officer Violence: Not At Risk   Fear of Current or Ex-Partner: No   Emotionally Abused: No   Physically Abused: No   Sexually Abused: No    Family History: Family History  Problem Relation Age of Onset   Colon cancer Mother    Cancer Father    Breast cancer Sister    Breast cancer Sister      Review of Systems: All other systems reviewed and are otherwise negative except as noted above.  Physical Exam: Vitals:   04/10/21 1222  BP: 118/78  Pulse: 84  SpO2: 100%  Weight: 181 lb (82.1 kg)  Height: 5\' 10"  (1.778 m)     GEN- The patient is well appearing, alert and oriented x 3 today.   HEENT: normocephalic, atraumatic; sclera clear, conjunctiva pink; hearing intact; oropharynx clear; neck supple  Lungs- Clear to ausculation bilaterally, normal work of breathing.  No wheezes, rales, rhonchi Heart- Regular rate and rhythm, no murmurs, rubs or gallops  GI- soft, non-tender, non-distended, bowel sounds present  Extremities- no clubbing, cyanosis, or edema  MS- no significant deformity or atrophy Skin- warm and dry, no rash or lesion; PPM pocket well healed Psych- euthymic mood, full affect Neuro- strength and sensation are intact  PPM Interrogation- reviewed in detail today,  See PACEART report  EKG:   EKG is ordered today. The ekg ordered today shows NSR at 84 bpm  Recent Labs: 10/25/2020: ALT 41; BUN 13; Creatinine, Ser 1.28; Hemoglobin 14.1; Platelets 246.0; Potassium 5.0; Sodium 141   Wt Readings from Last 3 Encounters:  04/10/21 181 lb (82.1 kg)  01/23/21 187 lb (84.8 kg)  12/28/20 188 lb (85.3 kg)     Other studies Reviewed: Additional studies/ records that were reviewed today include: Previous EP office notes, Previous remote checks, Most recent labwork.   Assessment and Plan:  1. Cryptogenic Stroke s/p Medtronic Loop recorder Normal device function See Pace Art report No changes today  2. Paroxysmal atrial  fibrillation Burden <1% on monitor Continue Xarelto for [id number] of at least 4.   3. HTN Stable on current regimen   Current medicines are reviewed at length with the patient today.   The patient does not have concerns regarding his medicines.  The following changes were made today:  none  Labs/ tests ordered today include:  Orders Placed This Encounter  Procedures   Basic metabolic panel   CBC   EKG 12-Lead     Disposition:   Follow up with EP APP in 6 Months. Would potentially establish with Dr. Quentin Ore, as he may eventually be watchman candidate.        At 6 month appointment can try to stagger with his PCP and see once yearly as long as he is getting routine labwork for [place].    Jacalyn Lefevre, PA-C  04/10/2021 12:45 PM  [place] [address] [place] [address] [phone] (office) [phone] (fax)

## 2021-04-10 NOTE — Patient Instructions (Signed)
Medication Instructions:  Your physician recommends that you continue on your current medications as directed. Please refer to the Current Medication list given to you today.  *If you need a refill on your cardiac medications before your next appointment, please call your pharmacy*   Lab Work: TODAY: BMET, CBC  If you have labs (blood work) drawn today and your tests are completely normal, you will receive your results only by: [place] (if you have MyChart) OR A paper copy in the mail If you have any lab test that is abnormal or we need to change your treatment, we will call you to review the results.   Follow-Up: At [facility], you and your health needs are our priority.  As part of our continuing mission to provide you with exceptional heart care, we have created designated Provider Care Teams.  These Care Teams include your primary Cardiologist (physician) and Advanced Practice Providers (APPs -  Physician Assistants and Nurse Practitioners) who all work together to provide you with the care you need, when you need it.  Your next appointment:   6 month(s)  The format for your next appointment:   In Person  Provider:   Legrand Como "Oda Kilts, PA-C

## 2021-04-11 LAB — CBC
Hematocrit: 45.9 % (ref 37.5–51.0)
Hemoglobin: 15.5 g/dL (ref 13.0–17.7)
MCH: 30.7 pg (ref 26.6–33.0)
MCHC: 33.8 g/dL (ref 31.5–35.7)
MCV: 91 fL (ref 79–97)
Platelets: 275 10*3/uL (ref 150–450)
RBC: 5.05 x10E6/uL (ref 4.14–5.80)
RDW: 13.9 % (ref 11.6–15.4)
WBC: 6.4 10*3/uL (ref 3.4–10.8)

## 2021-04-11 LAB — BASIC METABOLIC PANEL
BUN/Creatinine Ratio: 13 (ref 10–24)
BUN: 14 mg/dL (ref 8–27)
CO2: 22 mmol/L (ref 20–29)
Calcium: 10.3 mg/dL — ABNORMAL HIGH (ref 8.6–10.2)
Chloride: 108 mmol/L — ABNORMAL HIGH (ref 96–106)
Creatinine, Ser: 1.06 mg/dL (ref 0.76–1.27)
Glucose: 113 mg/dL — ABNORMAL HIGH (ref 70–99)
Potassium: 4.9 mmol/L (ref 3.5–5.2)
Sodium: 143 mmol/L (ref 134–144)
eGFR: 76 mL/min/{1.73_m2} (ref >59)

## 2021-04-18 NOTE — Progress Notes (Signed)
[facility]link Summary Report / Loop Recorder 

## 2021-04-24 ENCOUNTER — Ambulatory Visit (INDEPENDENT_AMBULATORY_CARE_PROVIDER_SITE_OTHER): Payer: Medicare HMO | Admitting: Physician Assistant

## 2021-04-24 ENCOUNTER — Encounter: Payer: Self-pay | Admitting: Physician Assistant

## 2021-04-24 ENCOUNTER — Other Ambulatory Visit: Payer: Self-pay

## 2021-04-24 VITALS — BP 122/80 | HR 109 | Temp 98.1°F | Ht 70.0 in | Wt 177.5 lb

## 2021-04-24 DIAGNOSIS — Z8673 Personal history of transient ischemic attack (TIA), and cerebral infarction without residual deficits: Secondary | ICD-10-CM | POA: Diagnosis not present

## 2021-04-24 DIAGNOSIS — I10 Essential (primary) hypertension: Secondary | ICD-10-CM | POA: Diagnosis not present

## 2021-04-24 DIAGNOSIS — R7303 Prediabetes: Secondary | ICD-10-CM | POA: Diagnosis not present

## 2021-04-24 DIAGNOSIS — I6340 Cerebral infarction due to embolism of unspecified cerebral artery: Secondary | ICD-10-CM | POA: Diagnosis not present

## 2021-04-24 DIAGNOSIS — F1020 Alcohol dependence, uncomplicated: Secondary | ICD-10-CM | POA: Diagnosis not present

## 2021-04-24 LAB — CBC WITH DIFFERENTIAL/PLATELET
Basophils Absolute: 0 10*3/uL (ref 0.0–0.1)
Basophils Relative: 0.5 % (ref 0.0–3.0)
Eosinophils Absolute: 0.3 10*3/uL (ref 0.0–0.7)
Eosinophils Relative: 5.4 % — ABNORMAL HIGH (ref 0.0–5.0)
HCT: 44.5 % (ref 39.0–52.0)
Hemoglobin: 14.4 g/dL (ref 13.0–17.0)
Lymphocytes Relative: 29.5 % (ref 12.0–46.0)
Lymphs Abs: 1.5 10*3/uL (ref 0.7–4.0)
MCHC: 32.3 g/dL (ref 30.0–36.0)
MCV: 94.6 fl (ref 78.0–100.0)
Monocytes Absolute: 0.3 10*3/uL (ref 0.1–1.0)
Monocytes Relative: 6.9 % (ref 3.0–12.0)
Neutro Abs: 2.9 10*3/uL (ref 1.4–7.7)
Neutrophils Relative %: 57.7 % (ref 43.0–77.0)
Platelets: 241 10*3/uL (ref 150.0–400.0)
RBC: 4.71 Mil/uL (ref 4.22–5.81)
RDW: 15.1 % (ref 11.5–15.5)
WBC: 4.9 10*3/uL (ref 4.0–10.5)

## 2021-04-24 LAB — HEMOGLOBIN A1C: Hgb A1c MFr Bld: 6.4 % (ref 4.6–6.5)

## 2021-04-24 NOTE — Patient Instructions (Signed)
It was great to see you!  Please work on alcohol reduction  I will be in touch with my blood work results and recommendations  Let's follow-up in 3 months, sooner if you have concerns.  Take care,  Inda Coke PA-C

## 2021-04-24 NOTE — Progress Notes (Signed)
Russell Russell is a 71 y.o. male here for a follow up of pre-diabetes  History of Present Illness:   Chief Complaint  Patient presents with   Hypertension    Pt is checking blood pressure at home ranging systolic 354'S and diastolic 56'C.   Prediabetes   [name] presented to today's visit with his wife, Russell Russell.  HPI  Pre-Diabetes 6 month follow-up. Current DM meds: not on any medication.  According to pt, he has a limited intake of juice/soda, but this is contradicted by intake of roughly 5 beers a day. Maddox's wife, Russell Russell does report that although they eat the same things, he does not eat as much as she does. At this time he is in agreement to start medication if deemed necessary, especially due to hx of stroke. Denies: hypoglycemic or hyperglycemic episodes or symptoms.   Lab Results  Component Value Date   HGBA1C 6.4 10/25/2020     HTN Currently compliant with taking lopressor 25 mg daily with no adverse effects. At home blood pressure readings are: checked regularly. Systolic ranges within the 127'N while diastolic ranges within the 70's. Patient denies chest pain, SOB, blurred vision, dizziness, unusual headaches, lower leg swelling. Denies excessive caffeine intake, stimulant usage, excessive alcohol intake, or increase in salt consumption.  BP Readings from Last 3 Encounters:  04/24/21 122/80  04/10/21 118/78  01/23/21 (!) 131/94   Hx of Cerebrovascular Accident Russell Russell is compliant with taking xarelto 20 mg daily and lipitor 40 mg daily with no complications. He is regularly following up with neurology and is managing well.    Past Medical History:  Diagnosis Date   Alcohol abuse    drinks 1 gallon of Gin every weekend, nothing during the week x >15 yrs   Elevated transaminase level    +elev bili: abd u/s 06/2014 showed stable small hepatic hemangiomas, o/w normal.   Essential hypertension    GERD (gastroesophageal reflux disease)    Hyperlipidemia 03/2014   Atorv  started-chol improved   Impaired fasting glucose 03/2014   Nephrolithiasis    PUD (peptic ulcer disease)    Stroke ([place])    Tobacco dependence    Russell Russell: "psych effects"     Social History   Tobacco Use   Smoking status: Every Day    Packs/day: 0.50    Years: 20.00    Pack years: 10.00    Types: Cigarettes   Smokeless tobacco: Never  Vaping Use   Vaping Use: Never used  Substance Use Topics   Alcohol use: Yes    Alcohol/week: 8.0 standard drinks    Types: 8 Cans of beer per week    Comment: Weekends only   Drug use: No    Past Surgical History:  Procedure Laterality Date   COLONOSCOPY  2005   Recall 10 yrs ([place])   LOOP RECORDER INSERTION N/A 07/03/2019   Procedure: LOOP RECORDER INSERTION;  Surgeon: Thompson Grayer, MD;  Location: [facility];  Service: Cardiovascular;  Laterality: N/A;   removal of kidney stones  2006   cystoscopic--removed from ureter.  No prob since.    Family History  Problem Relation Age of Onset   Colon cancer Mother    Cancer Father    Breast cancer Sister    Breast cancer Sister     Allergies  Allergen Reactions   Penicillins Rash    Current Medications:   Current Outpatient Medications:    acetaminophen (TYLENOL) 325 MG tablet, Take 2 tablets (650 mg  total) by mouth every 4 (four) hours as needed for mild pain (or temp > 37.5 C (99.5 F))., Disp:  , Rfl:    atorvastatin (LIPITOR) 40 MG tablet, Take 1 tablet (40 mg total) by mouth daily., Disp: 90 tablet, Rfl: 1   folic acid (FOLVITE) 1 MG tablet, TAKE 1 TABLET BY MOUTH EVERY DAY, Disp: 90 tablet, Rfl: 1   halobetasol (ULTRAVATE) 0.05 % cream, Apply 1 application topically as needed (skin irritation)., Disp: , Rfl:    metoprolol tartrate (LOPRESSOR) 25 MG tablet, Take 1 tablet (25 mg total) by mouth daily. Pt needs to make appt with provider for further refills - 1st attempt, Disp: 30 tablet, Rfl: 0   Multiple Vitamin (MULTIVITAMIN WITH MINERALS) TABS tablet, Take 1 tablet  by mouth daily., Disp:  , Rfl:    omeprazole (PRILOSEC) 20 MG capsule, Take 20 mg by mouth as needed (acid reflux)., Disp: , Rfl:    rivaroxaban (XARELTO) 20 MG TABS tablet, TAKE 1 TABLET EVERY DAY WITH SUPPER., Disp: 90 tablet, Rfl: 0   Review of Systems:   ROS Negative unless otherwise specified per HPI. Vitals:   Vitals:   04/24/21 0945  BP: 122/80  Pulse: (!) 109  Temp: 98.1 F (36.7 C)  TempSrc: Temporal  SpO2: 99%  Weight: 177 lb 8 oz (80.5 kg)  Height: 5\' 10"  (1.778 m)     Body mass index is 25.47 kg/m.  Physical Exam:   Physical Exam Vitals and nursing note reviewed.  Constitutional:      General: He is not in acute distress.    Appearance: He is well-developed. He is not ill-appearing or toxic-appearing.  Cardiovascular:     Rate and Rhythm: Normal rate and regular rhythm.     Pulses: Normal pulses.     Heart sounds: Normal heart sounds, S1 normal and S2 normal.  Pulmonary:     Effort: Pulmonary effort is normal.     Breath sounds: Normal breath sounds.  Skin:    General: Skin is warm and dry.  Neurological:     Mental Status: He is alert.     GCS: GCS eye subscore is 4. GCS verbal subscore is 5. GCS motor subscore is 6.  Psychiatric:        Speech: Speech normal.        Behavior: Behavior normal. Behavior is cooperative.    Assessment and Plan:   Cerebrovascular accident (CVA) due to embolism of cerebral artery ([name]) Stable Discussed needs for alcohol reduction, management of blood sugars, cholesterol and blood pressure Update labs, will adjust medication based on lipid panel results  Continue Xarelto 20 mg and Lipitor 40 mg daily  Follow up in 3 months, sooner if concerns occur  Essential hypertension Controlled Continue Lopressor 25 mg daily   Continue to monitor BP regularly  I advised patient that if BP readings are consistently >130/90, to reach out to office and medication will be adjusted Follow up in 3 months, sooner if concerns    Prediabetes Update HgbA1c today, will start medication as indicated by results Recommended patient work on alcohol reduction  Encouraged patient to work on participating in healthy eating-- provided handout information Follow up in 3 months, sooner if concerns  Alcoholism Continues to drink 5 beers daily -- encouraged slow reduction Patient is not ready to quit drinking at this time Continue to monitor    I,Havlyn C Ratchford,acting as a scribe for [employer], PA.,have documented all relevant documentation on the behalf of  Inda Coke, PA,as directed by  Inda Coke, PA while in the presence of [place], [place].  I, [place], [place], have reviewed all documentation for this visit. The documentation on 04/24/21 for the exam, diagnosis, procedures, and orders are all accurate and complete.   Inda Coke, PA-C

## 2021-04-25 LAB — COMPREHENSIVE METABOLIC PANEL
ALT: 42 U/L (ref 0–53)
AST: 40 U/L — ABNORMAL HIGH (ref 0–37)
Albumin: 4.1 g/dL (ref 3.5–5.2)
Alkaline Phosphatase: 128 U/L — ABNORMAL HIGH (ref 39–117)
BUN: 14 mg/dL (ref 6–23)
CO2: 27 mEq/L (ref 19–32)
Calcium: 10 mg/dL (ref 8.4–10.5)
Chloride: 107 mEq/L (ref 96–112)
Creatinine, Ser: 1.18 mg/dL (ref 0.40–1.50)
GFR: 63.03 mL/min (ref >60.00)
Glucose, Bld: 276 mg/dL — ABNORMAL HIGH (ref 70–99)
Potassium: 4.7 mEq/L (ref 3.5–5.1)
Sodium: 142 mEq/L (ref 135–145)
Total Bilirubin: 0.8 mg/dL (ref 0.2–1.2)
Total Protein: 7.2 g/dL (ref 6.0–8.3)

## 2021-04-25 LAB — LIPID PANEL
Cholesterol: 136 mg/dL (ref 0–200)
HDL: 44.2 mg/dL (ref >39.00)
LDL Cholesterol: 70 mg/dL (ref 0–99)
NonHDL: 92.24
Total CHOL/HDL Ratio: 3
Triglycerides: 112 mg/dL (ref 0.0–149.0)
VLDL: 22.4 mg/dL (ref 0.0–40.0)

## 2021-04-26 ENCOUNTER — Other Ambulatory Visit: Payer: Self-pay | Admitting: Physician Assistant

## 2021-04-26 DIAGNOSIS — R7989 Other specified abnormal findings of blood chemistry: Secondary | ICD-10-CM

## 2021-05-15 ENCOUNTER — Ambulatory Visit (INDEPENDENT_AMBULATORY_CARE_PROVIDER_SITE_OTHER): Payer: Medicare HMO

## 2021-05-15 DIAGNOSIS — I6340 Cerebral infarction due to embolism of unspecified cerebral artery: Secondary | ICD-10-CM | POA: Diagnosis not present

## 2021-05-15 LAB — CUP PACEART REMOTE DEVICE CHECK
Date Time Interrogation Session: 20230303230340
Implantable Pulse Generator Implant Date: 20210423

## 2021-05-25 NOTE — Progress Notes (Signed)
[facility]link Summary Report / Loop Recorder 

## 2021-06-09 ENCOUNTER — Other Ambulatory Visit: Payer: Self-pay | Admitting: Physician Assistant

## 2021-06-19 ENCOUNTER — Ambulatory Visit (INDEPENDENT_AMBULATORY_CARE_PROVIDER_SITE_OTHER): Payer: Medicare HMO

## 2021-06-19 DIAGNOSIS — I6340 Cerebral infarction due to embolism of unspecified cerebral artery: Secondary | ICD-10-CM

## 2021-06-20 LAB — CUP PACEART REMOTE DEVICE CHECK
Date Time Interrogation Session: 20230405230936
Implantable Pulse Generator Implant Date: 20210423

## 2021-07-03 ENCOUNTER — Ambulatory Visit: Payer: Medicare HMO | Admitting: Adult Health

## 2021-07-03 ENCOUNTER — Encounter: Payer: Self-pay | Admitting: Adult Health

## 2021-07-03 VITALS — BP 121/91 | HR 82 | Ht 70.0 in | Wt 171.0 lb

## 2021-07-03 DIAGNOSIS — G8114 Spastic hemiplegia affecting left nondominant side: Secondary | ICD-10-CM | POA: Diagnosis not present

## 2021-07-03 DIAGNOSIS — I639 Cerebral infarction, unspecified: Secondary | ICD-10-CM | POA: Diagnosis not present

## 2021-07-03 DIAGNOSIS — I480 Paroxysmal atrial fibrillation: Secondary | ICD-10-CM | POA: Diagnosis not present

## 2021-07-03 NOTE — Progress Notes (Signed)
?[facility] ?[address] ?[place]. [place] [address] ?[phone] ? ?     OFFICE FOLLOW UP NOTE ? ?[name] ?Date of Birth:  Sep 19, 1950 ?Medical Record Number:  [id number]  ? ?Referring MD: Rosalin Hawking ?Reason for Referral: Stroke ? ? ?Chief Complaint  ?Patient presents with  ? Follow-up  ?  RM 2 with spouse wanda ?Pt is well and stable, no new concerns   ? ?  ? ? ?HPI:  ? ?Update 07/03/2021 JM: Patient returns for 70-monthfollow-up visit accompanied by his wife.  Overall stable without new stroke/TIA symptoms.  Residual right hemiparesis, gait impairment and dysarthria stable.  Continued use of AD, denies any recent falls.  Able to maintain ADLs independently.  Compliant on Xarelto and atorvastatin, denies side effects.  Blood pressure today 121/91.  Continued tobacco and EtOH abuse.  Closely follows with PCP and cardiology.  No new concerns at this time. ? ? ? ?History provided for reference purposes only ?Update 12/28/2020 JM: Returns for 7105-monthtroke follow-up accompanied by his wife, [name]? ?Overall stable without new stroke/TIA symptoms ?Recently completed PT meeting all stated rehab goals with residual mild gait impairment with imbalance and left hemiparesis although overall progressive prior visit.  Use of RW for long distance and use of a cane for short distance.  Denies any recent falls.  Continues HEP ? ?Compliant on Xarelto and atorvastatin without side effects ?Blood pressure today 128/88- monitors at home and has been stable ?Continue tobacco use approx 1 pack every 1-2 days - has tried nicotine patch previously but irritated skin - he does wish to quit ?Continued EtOH use with pt reporting 3 per day but wife corrected him stating approx 6 beers per day ? ?No new concerns at this time ? ?Update 06/28/2020 JM: [name] for 70 month follow up accompanied by his wife, [name] ?Stable from stroke standpoint without new stroke/TIA symptoms ?Residual deficits of left  hemiparesis and gait impairment with imbalance but reports ongoing improvement currently working with PT/OT.  Continue use of rolling walker at all times and denies any recent falls ? ?Loop recorder showed evidence of A. fib in 01/2020 - started on Xarelto as Eliquis not covered by patient's insurance.  He has remained on Xarelto without bleeding or bruising concerns ?Reports compliance on atorvastatin -denies associated side effects ?Blood pressure today 118/80 -monitors at home which has been stable ?Unfortunately, he has returned back to tobacco and EtOH use currently smoking 1 pack every 3 days and drinking approximately 3-4 beers per day ? ?No further concerns at this time ? ?Update 12/29/2019 JM: Mr. InKleinpetereturns for 3 months stroke follow-up accompanied by his wife.  He has been doing well since prior visit with residual left-sided weakness, gait impairment and imbalance currently working with PT with continued progress.  Currently ambulating short distances with rolling walker and denies any recent falls.  Denies new or worsening stroke/TIA symptoms.  Continues on aspirin and atorvastatin for secondary stroke prevention without side effects.  Blood pressure today 134/94.  Monitors at home and typically 120-130s/80s.  Loop recorder has not shown atrial fibrillation thus far.  Reports continued tobacco and EtOH cessation.  No concerns at this time. ? ?Consult visit 09/22/2019 Dr. SeLeonie ManMr. InRodarte768ear old male seen today for initial office consultation visit for stroke in April 2021. History is obtained from the patient and review of electronic medical records and I personally reviewed available imaging films in PACS.  He is  a 71 year old male with past medical history of hypertension, hyperlipidemia, tobacco abuse and alcohol abuse who presented to [facility]2019 with left-sided weakness did not call for help right away.  And his wife notices in the evening she brought him to the  hospital.  His NIH stroke scale was 9.  CT scan of the head showed no acute abnormality.  CT angiogram of the head and neck showed no large vessel occlusion or stenosis.  There is subtle right internal carotid artery bulb filling defect raising possibility of carotid web versus thrombus.  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  MRI scan showed multiple scattered posterior right basal ganglia, right cerebral, right cerebellar infarcts with multiple old lacune's in both basal ganglia, thalami, pons and left cerebellum.  Carotid ultrasound showed a focal area of the right ICA bulb possibly a thrombus versus web.  2D echo showed normal ejection fraction.  Lower extremity venous Dopplers was negative for DVT.  LDL cholesterol was 109 mg percent.  Hemoglobin A1c was 6.2.  Patient had loop recorder inserted to look for paroxysmal A. fib.  Patient was discharged on 3 months of aspirin and Plavix followed by aspirin alone.  Patient states he has done well since discharge.  He is quit smoking as well as drinking alcohol.  He is currently undergoing outpatient physical and occupational therapy.  He is walking with a walker but needs a 1 person assist.  He also needs some help with activities of daily living like cutting his food but he can eat himself.  Is tolerating aspirin and Plavix well with only minor bruising.  He has had no recurrent stroke or TIA symptoms. ? ? ?ROS:   ?14 system review of systems is positive for those listed in HPI and all other symptoms negative ? ?PMH:  ?Past Medical History:  ?Diagnosis Date  ? Alcohol abuse   ? drinks 1 gallon of Gin every weekend, nothing during the week x >15 yrs  ? Elevated transaminase level   ? +elev bili: abd u/s 06/2014 showed stable small hepatic hemangiomas, o/w normal.  ? Essential hypertension   ? GERD (gastroesophageal reflux disease)   ? Hyperlipidemia 03/2014  ? Atorv started-chol improved  ? Impaired fasting glucose 03/2014  ? Nephrolithiasis   ? PUD  (peptic ulcer disease)   ? Stroke [facility])   ? Tobacco dependence   ? chantix: "psych effects"  ? ? ?Social History:  ?Social History  ? ?Socioeconomic History  ? Marital status: Married  ?  Spouse name: Not on file  ? Number of children: Not on file  ? Years of education: Not on file  ? Highest education level: Not on file  ?Occupational History  ? Not on file  ?Tobacco Use  ? Smoking status: Every Day  ?  Packs/day: 0.50  ?  Years: 20.00  ?  Pack years: 10.00  ?  Types: Cigarettes  ? Smokeless tobacco: Never  ?Vaping Use  ? Vaping Use: Never used  ?Substance and Sexual Activity  ? Alcohol use: Yes  ?  Alcohol/week: 8.0 standard drinks  ?  Types: 8 Cans of beer per week  ?  Comment: Weekends only  ? Drug use: No  ? Sexual activity: Not on file  ?Other Topics Concern  ? Not on file  ?Social History Narrative  ? Married, no children.  ? 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  ?  Tob: 10 pack-yr hx (current as of 03/2014).  ? Alcohol: 8 beers and pint of whisky on weekends only.  ? Denies hx of prob with drugs or alcohol.  ? ?Social Determinants of Health  ? ?Financial Resource Strain: Low Risk   ? Difficulty of Paying Living Expenses: Not hard at all  ?Food Insecurity: No Food Insecurity  ? Worried About Charity fundraiser in the Last Year: Never true  ? Ran Out of Food in the Last Year: Never true  ?Transportation Needs: No Transportation Needs  ? Lack of Transportation (Medical): No  ? Lack of Transportation (Non-Medical): No  ?Physical Activity: Inactive  ? Days of Exercise per Week: 0 days  ? Minutes of Exercise per Session: 0 min  ?Stress: No Stress Concern Present  ? Feeling of Stress : Not at all  ?Social Connections: Moderately Integrated  ? Frequency of Communication with Friends and Family: More than three times a week  ? Frequency of Social Gatherings with Friends and Family: More than three times a week  ? Attends Religious Services: More than 4 times per year  ?  Active Member of Clubs or Organizations: No  ? Attends Archivist Meetings: Never  ? Marital Status: Married  ?Intimate Partner Violence: Not At Risk  ? Fear of Current or Ex-Partner: No  ? Emotionally

## 2021-07-03 NOTE — Patient Instructions (Addendum)
Continue Xarelto (rivaroxaban) daily  and atorvastatin for secondary stroke prevention ? ?Continue to follow with cardiology for atrial fibrillation and Xarelto management continue use of AD at all times for fall prevention at all times ? ?Continue to follow up with PCP regarding blood pressure and cholesterol management  ?Maintain strict control of hypertension with blood pressure goal below 130/90 and cholesterol with LDL cholesterol (bad cholesterol) goal below 70 mg/dL.  ? ?Highly encourage complete tobacco and alcohol cessation as continued use greatly increases your risk of additional strokes ? ?Signs of a Stroke? Follow the BEFAST method:  ?Balance Watch for a sudden loss of balance, trouble with coordination or vertigo ?Eyes Is there a sudden loss of vision in one or both eyes? Or double vision?  ?Face: Ask the person to smile. Does one side of the face droop or is it numb?  ?Arms: Ask the person to raise both arms. Does one arm drift downward? Is there weakness or numbness of a leg? ?Speech: Ask the person to repeat a simple phrase. Does the speech sound slurred/strange? Is the person confused ? ?Time: If you observe any of these signs, call 911. ? ? ? ? ? ? ? ?Thank you for coming to see [place] at [facility]. I hope we have been able to provide you high quality care today. ? ?You may receive a patient satisfaction survey over the next few weeks. We would appreciate your feedback and comments so that we may continue to improve ourselves and the health of our patients. ? ?

## 2021-07-04 NOTE — Progress Notes (Signed)
[facility]link Summary Report / Loop Recorder 

## 2021-07-24 ENCOUNTER — Ambulatory Visit: Payer: Medicare HMO | Admitting: Physician Assistant

## 2021-07-24 ENCOUNTER — Ambulatory Visit (INDEPENDENT_AMBULATORY_CARE_PROVIDER_SITE_OTHER): Payer: Medicare HMO

## 2021-07-24 DIAGNOSIS — I6340 Cerebral infarction due to embolism of unspecified cerebral artery: Secondary | ICD-10-CM | POA: Diagnosis not present

## 2021-07-24 LAB — CUP PACEART REMOTE DEVICE CHECK
Date Time Interrogation Session: 20230510231244
Implantable Pulse Generator Implant Date: 20210423

## 2021-07-25 ENCOUNTER — Ambulatory Visit: Payer: Medicare HMO | Admitting: Registered Nurse

## 2021-07-26 ENCOUNTER — Encounter: Payer: Medicare HMO | Attending: Registered Nurse | Admitting: Registered Nurse

## 2021-07-26 ENCOUNTER — Encounter: Payer: Self-pay | Admitting: Registered Nurse

## 2021-07-26 VITALS — BP 120/86 | HR 81 | Ht 70.0 in

## 2021-07-26 DIAGNOSIS — I10 Essential (primary) hypertension: Secondary | ICD-10-CM

## 2021-07-26 DIAGNOSIS — R269 Unspecified abnormalities of gait and mobility: Secondary | ICD-10-CM | POA: Insufficient documentation

## 2021-07-26 DIAGNOSIS — I6381 Other cerebral infarction due to occlusion or stenosis of small artery: Secondary | ICD-10-CM | POA: Diagnosis not present

## 2021-07-26 DIAGNOSIS — I69354 Hemiplegia and hemiparesis following cerebral infarction affecting left non-dominant side: Secondary | ICD-10-CM | POA: Diagnosis not present

## 2021-07-26 NOTE — Progress Notes (Signed)
Subjective:    Patient ID: Russell Russell, male    DOB: 23-Jul-1950, 71 y.o.   MRN: [id number]  HPI: Russell Russell is a 71 y.o. male who returns for follow up appointment for chronic pain and medication refill. He states he has no pain today.Marland Kitchen He rates his pain 0. His current exercise regime is walking with his walker.   Pain Inventory Average Pain 0 Pain Right Now 0 My pain is  No pain  LOCATION OF PAIN  No pain  BOWEL Number of stools per week: 7 7  BLADDER Pads    Mobility walk with assistance use a cane use a walker how many minutes can you walk? unknown ability to climb steps?  yes do you drive?  no Do you have any goals in this area?  yes  Function retired Do you have any goals in this area?  yes  Neuro/Psych weakness trouble walking  Prior Studies Any changes since last visit?  no  Physicians involved in your care Any changes since last visit?  no   Family History  Problem Relation Age of Onset   Colon cancer Mother    Cancer Father    Breast cancer Sister    Breast cancer Sister    Social History   Socioeconomic History   Marital status: Married    Spouse name: Not on file   Number of children: Not on file   Years of education: Not on file   Highest education level: Not on file  Occupational History   Not on file  Tobacco Use   Smoking status: Every Day    Packs/day: 0.50    Years: 20.00    Pack years: 10.00    Types: Cigarettes   Smokeless tobacco: Never  Vaping Use   Vaping Use: Never used  Substance and Sexual Activity   Alcohol use: Yes    Alcohol/week: 8.0 standard drinks    Types: 8 Cans of beer per week    Comment: Weekends only   Drug use: No   Sexual activity: Not on file  Other Topics Concern   Not on file  Social History Narrative   Married, no children.   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   Tob: 10 pack-yr hx (current as of 03/2014).   Alcohol: 8 beers and pint  of whisky on weekends only.   Denies hx of prob with drugs or alcohol.   Social Determinants of Health   Financial Resource Strain: Low Risk    Difficulty of Paying Living Expenses: Not hard at all  Food Insecurity: No Food Insecurity   Worried About Charity fundraiser in the Last Year: Never true   [place] in the Last Year: Never true  Transportation Needs: No Transportation Needs   Lack of Transportation (Medical): No   Lack of Transportation (Non-Medical): No  Physical Activity: Inactive   Days of Exercise per Week: 0 days   Minutes of Exercise per Session: 0 min  Stress: No Stress Concern Present   Feeling of Stress : Not at all  Social Connections: Moderately Integrated   Frequency of Communication with Friends and Family: More than three times a week   Frequency of Social Gatherings with Friends and Family: More than three times a week   Attends Religious Services: More than 4 times per year   Active Member of [employer] or Organizations: No   Attends Archivist Meetings:  Never   Marital Status: Married   Past Surgical History:  Procedure Laterality Date   COLONOSCOPY  2005   Recall 10 yrs ([place])   LOOP RECORDER INSERTION N/A 07/03/2019   Procedure: LOOP RECORDER INSERTION;  Surgeon: Thompson Grayer, MD;  Location: [facility];  Service: Cardiovascular;  Laterality: N/A;   removal of kidney stones  2006   cystoscopic--removed from ureter.  No prob since.   Past Medical History:  Diagnosis Date   Alcohol abuse    drinks 1 gallon of Gin every weekend, nothing during the week x >15 yrs   Elevated transaminase level    +elev bili: abd u/s 06/2014 showed stable small hepatic hemangiomas, o/w normal.   Essential hypertension    GERD (gastroesophageal reflux disease)    Hyperlipidemia 03/2014   Atorv started-chol improved   Impaired fasting glucose 03/2014   Nephrolithiasis    PUD (peptic ulcer disease)    Stroke ([place])    Tobacco dependence     chantix: "psych effects"   Ht '5\' 10"'$  (1.778 m)   BMI 24.54 kg/m   Opioid Risk Score:   Fall Risk Score:  `1  Depression screen [facility] 2/9     07/26/2021    8:46 AM 02/17/2021    2:28 PM 01/23/2021   10:06 AM 10/25/2020   11:00 AM 10/21/2020   10:03 AM 07/26/2020   10:15 AM 04/28/2020    1:25 PM  Depression screen PHQ 2/9  Decreased Interest 0 0 0 0 0 0 0  Down, Depressed, Hopeless 0 0 0 0 0 0 0  PHQ - 2 Score 0 0 0 0 0 0 0    Review of Systems  Musculoskeletal:  Positive for gait problem.       Balance problem  All other systems reviewed and are negative.     Objective:   Physical Exam Vitals and nursing note reviewed.  Constitutional:      Appearance: Normal appearance.  Cardiovascular:     Rate and Rhythm: Normal rate and regular rhythm.     Pulses: Normal pulses.     Heart sounds: Normal heart sounds.  Pulmonary:     Effort: Pulmonary effort is normal.     Breath sounds: Normal breath sounds.  Musculoskeletal:     Cervical back: Normal range of motion and neck supple.     Comments: Normal Muscle Bulk and Muscle Testing Reveals:  Upper Extremities: Full ROM and Muscle Strength 5/5  Lumbar Paraspinal Tenderness: L-4-L-5 Lower Extremities: Full ROM and Muscle Strength 5/5 Arises from Table with Walker  Narrow Based Gait     Skin:    General: Skin is warm and dry.  Neurological:     Mental Status: He is alert and oriented to person, place, and time.  Psychiatric:        Mood and Affect: Mood normal.        Behavior: Behavior normal.         Assessment & Plan:  1.  Left side hemiparesis secondary to multiple scattered anterior and posterior right brain infarcts.  Neurology Following. He completed his outpatient Physical Therapy with Neuro-Rehabilitation.  Wearing Left AFO today. 07/26/2021 2.  History of tobacco and alcohol use.: Educated to Continue with  Smoking and Alcohol  Cessation and he verbalizes understanding.  Continue to monitor.  07/26/2021 4. Gait  abnormality: Continue to use walker for support at all times. Continue with Rolator for Safety.07/26/2021   F/U in 6 months

## 2021-07-27 ENCOUNTER — Encounter: Payer: Self-pay | Admitting: Physician Assistant

## 2021-07-27 ENCOUNTER — Ambulatory Visit (INDEPENDENT_AMBULATORY_CARE_PROVIDER_SITE_OTHER): Payer: Medicare HMO | Admitting: Physician Assistant

## 2021-07-27 VITALS — BP 128/84 | HR 73 | Temp 97.7°F | Ht 70.0 in | Wt 171.2 lb

## 2021-07-27 DIAGNOSIS — I6340 Cerebral infarction due to embolism of unspecified cerebral artery: Secondary | ICD-10-CM | POA: Diagnosis not present

## 2021-07-27 DIAGNOSIS — R7989 Other specified abnormal findings of blood chemistry: Secondary | ICD-10-CM | POA: Diagnosis not present

## 2021-07-27 DIAGNOSIS — N529 Male erectile dysfunction, unspecified: Secondary | ICD-10-CM | POA: Diagnosis not present

## 2021-07-27 DIAGNOSIS — Z720 Tobacco use: Secondary | ICD-10-CM

## 2021-07-27 DIAGNOSIS — I10 Essential (primary) hypertension: Secondary | ICD-10-CM

## 2021-07-27 DIAGNOSIS — R7303 Prediabetes: Secondary | ICD-10-CM

## 2021-07-27 LAB — COMPREHENSIVE METABOLIC PANEL
ALT: 50 U/L (ref 0–53)
AST: 57 U/L — ABNORMAL HIGH (ref 0–37)
Albumin: 4 g/dL (ref 3.5–5.2)
Alkaline Phosphatase: 109 U/L (ref 39–117)
BUN: 12 mg/dL (ref 6–23)
CO2: 27 mEq/L (ref 19–32)
Calcium: 9.8 mg/dL (ref 8.4–10.5)
Chloride: 107 mEq/L (ref 96–112)
Creatinine, Ser: 1.2 mg/dL (ref 0.40–1.50)
GFR: 61.66 mL/min (ref >60.00)
Glucose, Bld: 120 mg/dL — ABNORMAL HIGH (ref 70–99)
Potassium: 4.7 mEq/L (ref 3.5–5.1)
Sodium: 139 mEq/L (ref 135–145)
Total Bilirubin: 0.9 mg/dL (ref 0.2–1.2)
Total Protein: 7.7 g/dL (ref 6.0–8.3)

## 2021-07-27 LAB — HEMOGLOBIN A1C: Hgb A1c MFr Bld: 6.2 % (ref 4.6–6.5)

## 2021-07-27 MED ORDER — TADALAFIL 10 MG PO TABS: 10.0000 mg | ORAL_TABLET | ORAL | 1 refills | Status: DC | PRN

## 2021-07-27 NOTE — Patient Instructions (Signed)
It was great to see you!  Use the GoodRx website to fine where the Cialis would be cheapest.  We will call you with your blood work.  Please call [phone] to schedule your Lung Cancer Screening.  Let's follow-up in 6 months, sooner if you have concerns.  Take care,  Inda Coke PA-C

## 2021-07-27 NOTE — Progress Notes (Signed)
Russell Russell is a 71 y.o. male here for a follow up of a pre-existing problem.  History of Present Illness:   Chief Complaint  Patient presents with   Follow-up    Pt states all is well and no issues to discuss;     HPI  Pre-Diabetes Patient here to 3 months follow up. He is currently not taking any medication. He had his A1c checked in the previous visit and this was elevated. He was recommended to decrease his alcohol intake at that time. He notes he has been trying to follow healthy diet and exercise. Per pt, he has not been drinking any sugary drinks. Denies hypoglycemic or hyperglycemic episodes or symptoms.   HTN Patient is currently taking lopressor 25 mg daily. He is tolerating his medication well. No reported side effects. At home blood pressure readings are checked regularly. He states his blood pressure at home are usually 130/85 or 130/90. Patient denies chest pain, SOB, blurred vision, dizziness, unusual headaches, lower leg swelling. Patient is compliant with medication. Denies excessive caffeine intake, stimulant usage, excessive. No excessive  alcohol intake, or increase in salt consumption.  BP Readings from Last 3 Encounters:  07/27/21 128/84  07/26/21 120/86  07/03/21 (!) 121/91    Hx of Cerebrovascular Accident  Patient is currently taking Xarelto 20 mg daily and Lipitor 40 mg daily without any side effects. He is managing well. He state that this Xarelto is expensive and is looking into help with paying for this.  ED Has difficulty obtaining erection. Would like medication.  Elevated LFTs This was found on blood work at last visit. He was reccommended to come in for repeat blood work but he did not. Denies abdominal pain. Continues to drink alcohol regularly.  Tobacco use Did not schedule appointment with Lung Cancer Screening team. Continues to smoke 1/2 PPD.  Past Medical History:  Diagnosis Date   Alcohol abuse    drinks 1 gallon of Gin every weekend,  nothing during the week x >15 yrs   Elevated transaminase level    +elev bili: abd u/s 06/2014 showed stable small hepatic hemangiomas, o/w normal.   Essential hypertension    GERD (gastroesophageal reflux disease)    Hyperlipidemia 03/2014   Atorv started-chol improved   Impaired fasting glucose 03/2014   Nephrolithiasis    PUD (peptic ulcer disease)    Stroke ([place])    Tobacco dependence    chantix: "psych effects"     Social History   Tobacco Use   Smoking status: Every Day    Packs/day: 0.50    Years: 40.00    Pack years: 20.00    Types: Cigarettes    Start date: 1974   Smokeless tobacco: Never  Vaping Use   Vaping Use: Never used  Substance Use Topics   Alcohol use: Yes    Alcohol/week: 8.0 standard drinks    Types: 8 Cans of beer per week    Comment: Weekends only   Drug use: No    Past Surgical History:  Procedure Laterality Date   COLONOSCOPY  2005   Recall 10 yrs ([place])   LOOP RECORDER INSERTION N/A 07/03/2019   Procedure: LOOP RECORDER INSERTION;  Surgeon: Thompson Grayer, MD;  Location: [facility];  Service: Cardiovascular;  Laterality: N/A;   removal of kidney stones  2006   cystoscopic--removed from ureter.  No prob since.    Family History  Problem Relation Age of Onset   Colon cancer Mother  Cancer Father    Breast cancer Sister    Breast cancer Sister     Allergies  Allergen Reactions   Penicillins Rash    Current Medications:   Current Outpatient Medications:    acetaminophen (TYLENOL) 325 MG tablet, Take 2 tablets (650 mg total) by mouth every 4 (four) hours as needed for mild pain (or temp > 37.5 C (99.5 F))., Disp:  , Rfl:    atorvastatin (LIPITOR) 40 MG tablet, TAKE 1 TABLET BY MOUTH EVERY DAY, Disp: 90 tablet, Rfl: 1   folic acid (FOLVITE) 1 MG tablet, TAKE 1 TABLET BY MOUTH EVERY DAY, Disp: 90 tablet, Rfl: 1   halobetasol (ULTRAVATE) 0.05 % cream, Apply 1 application topically as needed (skin irritation)., Disp: , Rfl:     metoprolol tartrate (LOPRESSOR) 25 MG tablet, Take 1 tablet (25 mg total) by mouth daily. Pt needs to make appt with provider for further refills - 1st attempt, Disp: 30 tablet, Rfl: 0   Multiple Vitamin (MULTIVITAMIN WITH MINERALS) TABS tablet, Take 1 tablet by mouth daily., Disp:  , Rfl:    omeprazole (PRILOSEC) 20 MG capsule, Take 20 mg by mouth as needed (acid reflux)., Disp: , Rfl:    rivaroxaban (XARELTO) 20 MG TABS tablet, TAKE 1 TABLET EVERY DAY WITH SUPPER., Disp: 90 tablet, Rfl: 0   tadalafil (CIALIS) 10 MG tablet, Take 1 tablet (10 mg total) by mouth every other day as needed for erectile dysfunction., Disp: 10 tablet, Rfl: 1   Review of Systems:   ROS Negative unless otherwise specified per HPI.  Vitals:   Vitals:   07/27/21 0821  BP: 128/84  Pulse: 73  Temp: 97.7 F (36.5 C)  TempSrc: Temporal  SpO2: 99%  Weight: 171 lb 3.2 oz (77.7 kg)  Height: '5\' 10"'$  (1.778 m)     Body mass index is 24.56 kg/m.  Physical Exam:   Physical Exam Vitals and nursing note reviewed.  Constitutional:      General: He is not in acute distress.    Appearance: He is well-developed. He is not ill-appearing or toxic-appearing.  Cardiovascular:     Rate and Rhythm: Normal rate and regular rhythm.     Pulses: Normal pulses.     Heart sounds: Normal heart sounds, S1 normal and S2 normal.  Pulmonary:     Effort: Pulmonary effort is normal.     Breath sounds: Normal breath sounds.  Skin:    General: Skin is warm and dry.  Neurological:     Mental Status: He is alert.     GCS: GCS eye subscore is 4. GCS verbal subscore is 5. GCS motor subscore is 6.  Psychiatric:        Speech: Speech normal.        Behavior: Behavior normal. Behavior is cooperative.    Assessment and Plan:   Elevated LFTs Will update blood work today and provide recommendations accordingly Continues to consume a significant amount of alcohol, has no desire to decrease or stop  Prediabetes Update A1c and provide  recommendations accordingly  Cerebrovascular accident (CVA) due to embolism of cerebral artery ([place]) Compliant with Xarelto Will plan to place Chronic Care Mgmt referral so patient can have discussion with pharmacist regarding Xarelto cost Continue efforts at healthy lifestyle  Erectile dysfunction, unspecified erectile dysfunction type Uncontrolled Trial cialis 10 mg -- recommended use of GoodRx and plan to take rx to cheapest location Follow-up prn  Essential hypertension Normotensive Continue lopressor 25 mg daily Follow-up in 6  months, sooner if concerns  Tobacco abuse Ongoing Refusing Lung Cancer CT screen and AAA screen  I,Savera Zaman,acting as a scribe for Inda Coke, PA.,have documented all relevant documentation on the behalf of Inda Coke, PA,as directed by  Inda Coke, PA while in the presence of Inda Coke, [place].   I, Inda Coke, [place], have reviewed all documentation for this visit. The documentation on 07/27/21 for the exam, diagnosis, procedures, and orders are all accurate and complete.  Inda Coke, PA-C

## 2021-07-27 NOTE — Addendum Note (Signed)
Addended by: Erlene Quan on: 07/27/2021 09:13 AM   Modules accepted: Orders

## 2021-07-28 ENCOUNTER — Telehealth: Payer: Self-pay | Admitting: Physician Assistant

## 2021-07-28 ENCOUNTER — Other Ambulatory Visit: Payer: Self-pay | Admitting: *Deleted

## 2021-07-28 DIAGNOSIS — R7989 Other specified abnormal findings of blood chemistry: Secondary | ICD-10-CM

## 2021-07-28 NOTE — Progress Notes (Signed)
  Chronic Care Management   Note  07/28/2021 Name: Russell Russell MRN: [id number] DOB: 10/02/1950  Russell Russell is a 71 y.o. year old male who is a primary care patient of Inda Coke, [place]. I reached out to [name] by phone today in response to a referral sent by Mr. Trisha Morandi PCP, Inda Coke, PA.   Mr. Panning was given information about Chronic Care Management services today including:  CCM service includes personalized support from designated clinical staff supervised by his physician, including individualized plan of care and coordination with other care providers 24/7 contact phone numbers for assistance for urgent and routine care needs. Service will only be billed when office clinical staff spend 20 minutes or more in a month to coordinate care. Only one practitioner may furnish and bill the service in a calendar month. The patient may stop CCM services at any time (effective at the end of the month) by phone call to the office staff.   Patient agreed to services and verbal consent obtained.   Follow up plan:REFERRAL/NO COPAY   [place]

## 2021-07-28 NOTE — Progress Notes (Signed)
  Chronic Care Management   Outreach Note  07/28/2021 Name: Russell Russell MRN: [id number] DOB: 06/02/1950  Referred by: Inda Coke, [place] Reason for referral : No chief complaint on file.   An unsuccessful telephone outreach was attempted today. The patient was referred to the pharmacist for assistance with care management and care coordination.   Follow Up Plan: REFERRAL/ NO COPAY  Tatjana Dellinger Upstream Scheduler

## 2021-07-31 ENCOUNTER — Telehealth: Payer: Self-pay | Admitting: Pharmacist

## 2021-07-31 NOTE — Progress Notes (Signed)
Chronic Care Management Pharmacy Assistant   Name: Russell Russell  MRN: [id number] DOB: 1950/12/05   Reason for Encounter: Chart Review For Initial Visit With Clinical Pharmacist   Conditions to be addressed/monitored: HTN, GERD, HLD,   Primary concerns for visit include: HTN, HLD   Recent office visits:  07/27/2021 [facility] (PCP) Inda Coke, PA; Will plan to place Chronic Care Mgmt referral so patient can have discussion with pharmacist regarding Xarelto cost, no medication changes indicated.  04/24/2021 [facility] (PCP) Inda Coke, PA; no medication changes indicated.  Recent consult visits:  07/26/2021 [facility] (Physical Medicine) Bayard Hugger, NP; no medication changes indicated.  07/03/2021 [facility] (Neurology) Frann Rider, NP; no medication changes indicated.  04/10/2021 [facility] (Cardiology) Shirley Friar, PA-C; no medication changes indicated.  Hospital visits:  None in previous 6 months  Medications: Outpatient Encounter Medications as of 07/31/2021  Medication Sig   acetaminophen (TYLENOL) 325 MG tablet Take 2 tablets (650 mg total) by mouth every 4 (four) hours as needed for mild pain (or temp > 37.5 C (99.5 F)).   atorvastatin (LIPITOR) 40 MG tablet TAKE 1 TABLET BY MOUTH EVERY DAY   folic acid (FOLVITE) 1 MG tablet TAKE 1 TABLET BY MOUTH EVERY DAY   halobetasol (ULTRAVATE) 0.05 % cream Apply 1 application topically as needed (skin irritation).   metoprolol tartrate (LOPRESSOR) 25 MG tablet Take 1 tablet (25 mg total) by mouth daily. Pt needs to make appt with provider for further refills - 1st attempt   Multiple Vitamin (MULTIVITAMIN WITH MINERALS) TABS tablet Take 1 tablet by mouth daily.   omeprazole (PRILOSEC) 20 MG capsule Take 20 mg by mouth as needed (acid reflux).   rivaroxaban (XARELTO) 20 MG TABS tablet TAKE 1 TABLET EVERY DAY WITH SUPPER.   tadalafil (CIALIS) 10 MG tablet Take 1 tablet (10 mg total) by mouth every other day as needed for erectile  dysfunction.   No facility-administered encounter medications on file as of 07/31/2021.   Current Medications: Tadalafil 10 mg Atorvastatin 40 mg last filled 05/25/2021 90 DS Xarelto 20 mg last filled 06/28/2021 90 DS Metoprolol Tartrate 25 mg last filled 67/89/3810 60 DS Folic Acid 1 mg last filled 06/16/2021 90 DS Halobetasol 0.05% cream last filled 09/29/2018 30 DS Multiple Vitamin with Minerals no Acetaminophen 325 mg takes as needed Omeprazole 20 mg no longer taking  Patient Questions: Any changes in your medications or health? Patient denies any changes in his medications or health.  Any side effects from any medications?  Patient denies having any side effects from any of his medications.  Do you have any symptoms or problems not managed by your medications? Patient denies having any symptoms or problems that is not currently managed by his medications.  Any concerns about your health right now? Patient denies having any concerns with his health at this time.  Has your provider asked that you check blood pressure, blood sugar, or follow special diet at home? Patient states he does check his blood pressure. He does not follow any special diet.  Do you get any type of exercise on a regular basis? Patient states he exercises his legs.  Can you think of a goal you would like to reach for your health? Patient states he does not have a goal.  Do you have any problems getting your medications? Patient denies having any problems getting his medications.  Is there anything that you would like to discuss during the appointment?  Patient would like to discuss  his medications.  Please bring medications and supplements to appointment  Care Gaps: Medicare Annual Wellness: Completed 02/17/2021 Hemoglobin A1C: 6.2% on 07/24/2021 Fecal DNA (Cologuard): Next due on 12/16/2023   Future Appointments  Date Time Provider [place]  08/01/2021  9:15 AM [facility] [place] [facility]  [facility]  08/08/2021  9:30 AM [facility] CCM PHARMACIST [facility]  08/28/2021  7:25 AM [facility] DEVICE REMOTES [facility]  10/02/2021  7:25 AM [facility] DEVICE REMOTES [facility]  11/06/2021  7:25 AM [facility] DEVICE REMOTES [facility]  12/11/2021  7:25 AM [facility] DEVICE REMOTES [facility]  01/15/2022  7:25 AM [facility] DEVICE REMOTES [facility]  01/22/2022  8:40 AM Bayard Hugger, NP [facility]  01/26/2022  8:20 AM Inda Coke, PA [facility]  02/19/2022  7:25 AM [facility] DEVICE REMOTES [facility]  03/01/2022  2:30 PM [facility] HEALTH COACH [facility]   Star Rating Drugs: Atorvastatin 40 mg last filled 05/25/2021 90 DS  April D Calhoun, [place] Pharmacist Assistant [phone]

## 2021-08-01 ENCOUNTER — Encounter: Payer: Self-pay | Admitting: Registered Nurse

## 2021-08-01 ENCOUNTER — Ambulatory Visit
Admission: RE | Admit: 2021-08-01 | Discharge: 2021-08-01 | Disposition: A | Discharge status: Home / Self Care | Payer: Medicare HMO | Source: Ambulatory Visit | Attending: Physician Assistant | Admitting: Physician Assistant

## 2021-08-01 DIAGNOSIS — R945 Abnormal results of liver function studies: Secondary | ICD-10-CM | POA: Diagnosis not present

## 2021-08-01 DIAGNOSIS — R7989 Other specified abnormal findings of blood chemistry: Secondary | ICD-10-CM

## 2021-08-01 DIAGNOSIS — D1803 Hemangioma of intra-abdominal structures: Secondary | ICD-10-CM | POA: Diagnosis not present

## 2021-08-01 IMAGING — US US ABDOMEN LIMITED
1 series · 14 of 25 positions shown · non-contrast
Comparison: CT scan of the abdomen and pelvis [DATE].
Abdominal ultrasound [DATE].

CLINICAL DATA: Elevated LFTs

EXAM:
ULTRASOUND ABDOMEN LIMITED RIGHT UPPER QUADRANT

[Series 1: us abdomen limited · 0.20mm/px · 14 of 61 slices shown]
[im 1/61]
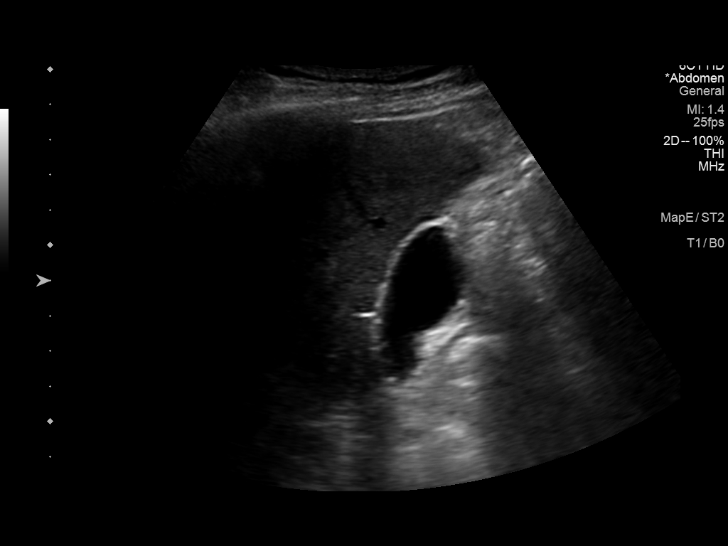
[im 6/61]
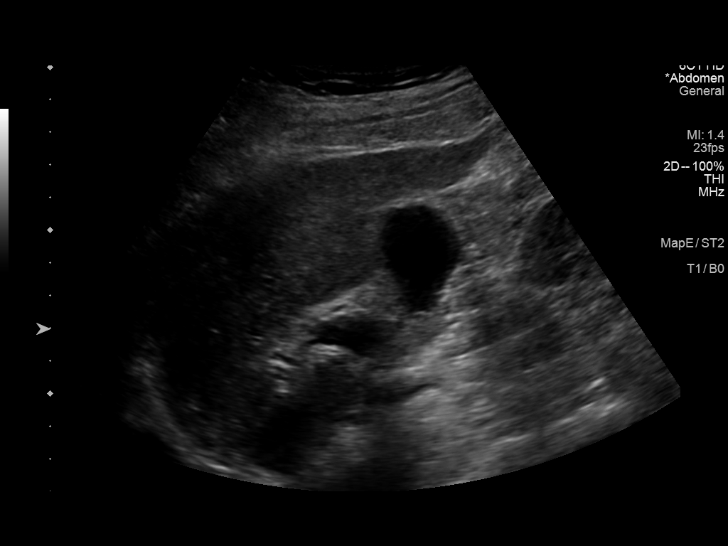
[im 11/61]
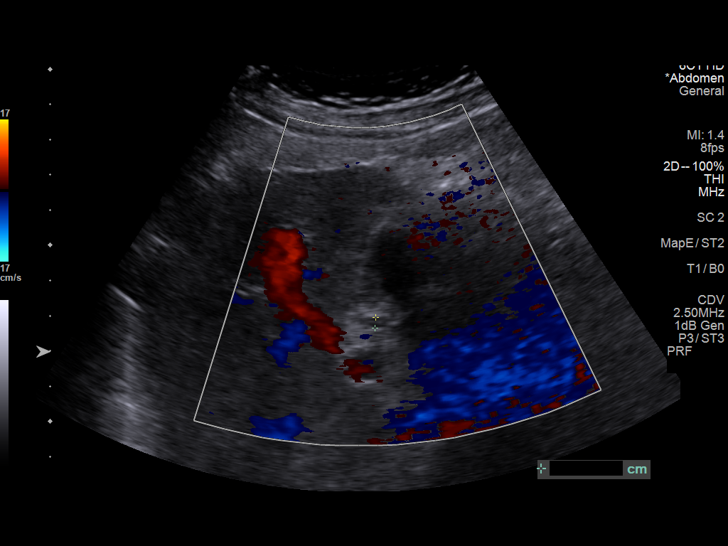
[im 16/61]
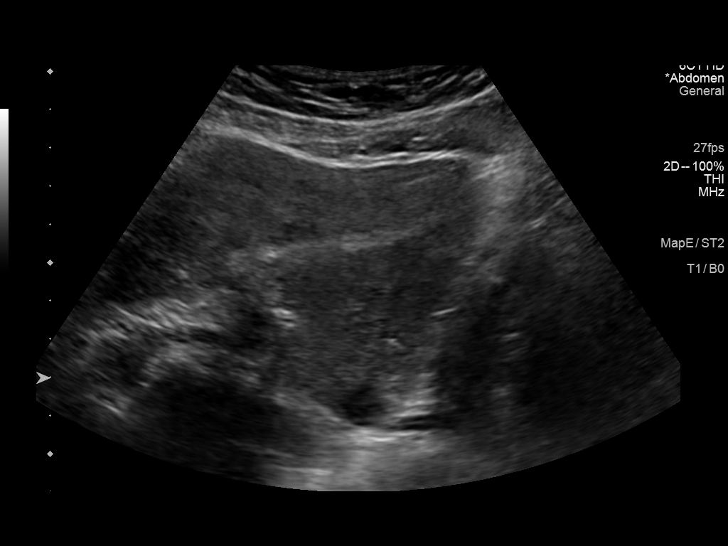
[im 21/61]
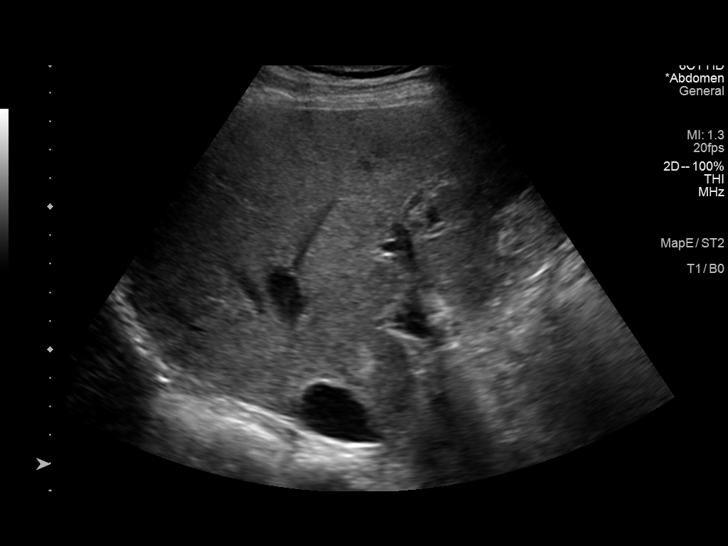
[im 23/61]
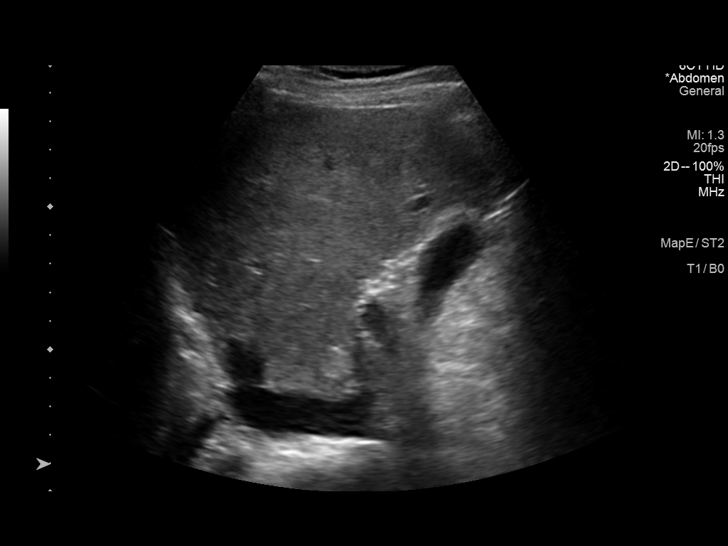
[im 28/61]
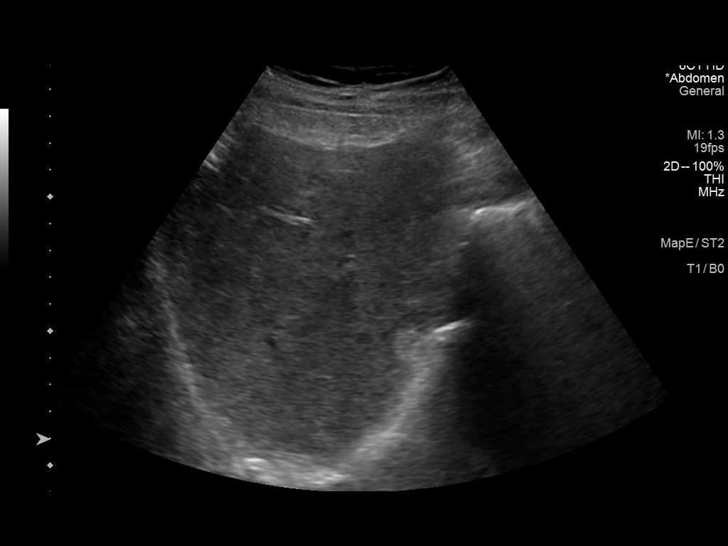
[im 33/61]
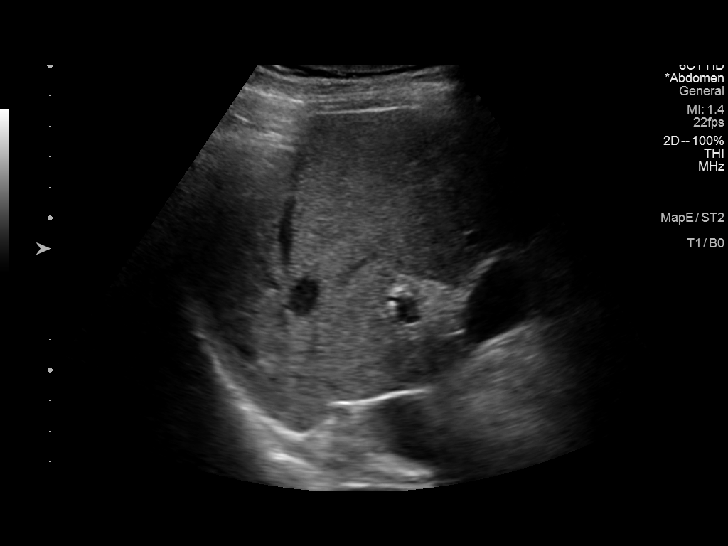
[im 38/61]
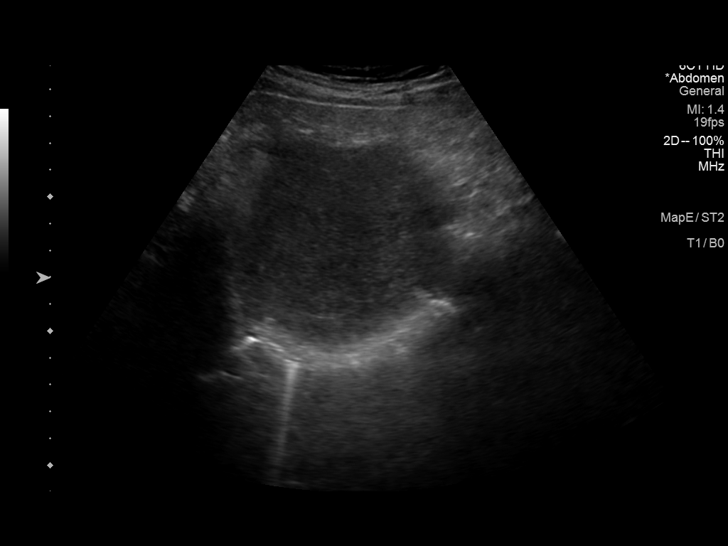
[im 41/61]
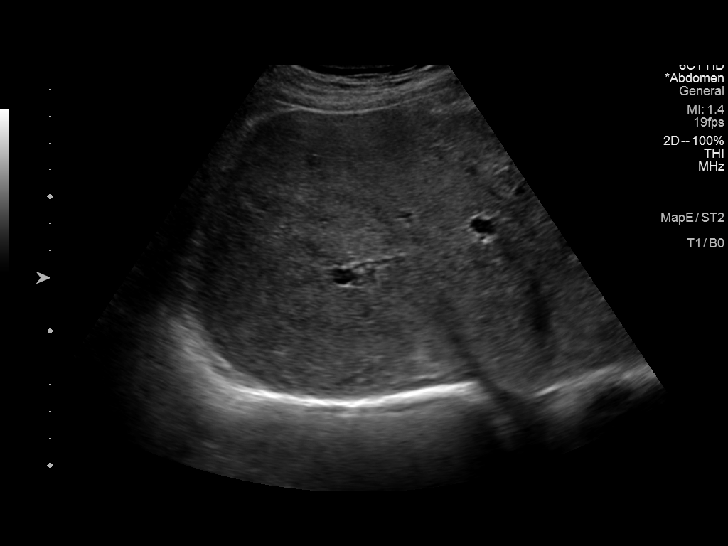
[im 46/61]
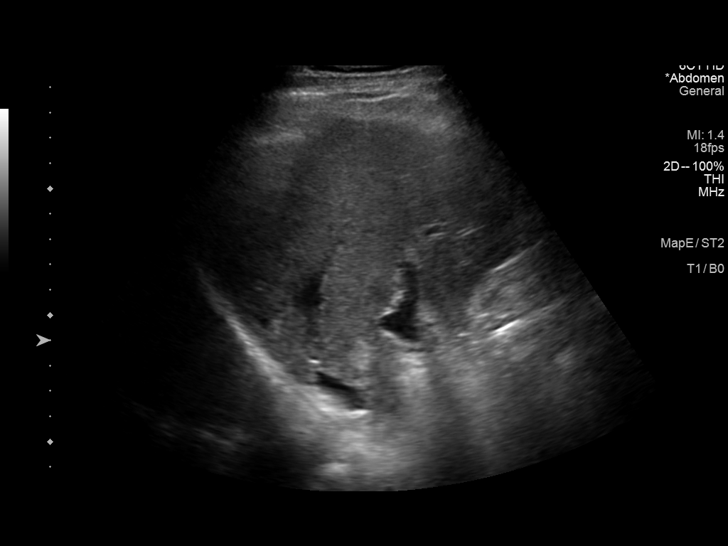
[im 51/61]
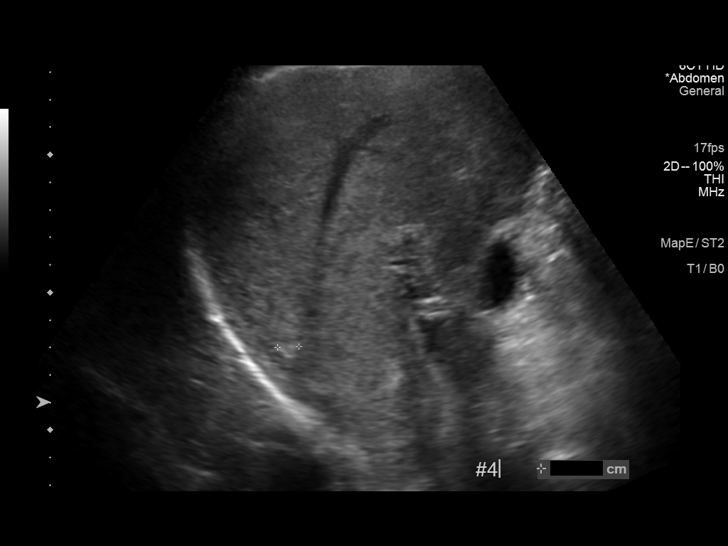
[im 56/61]
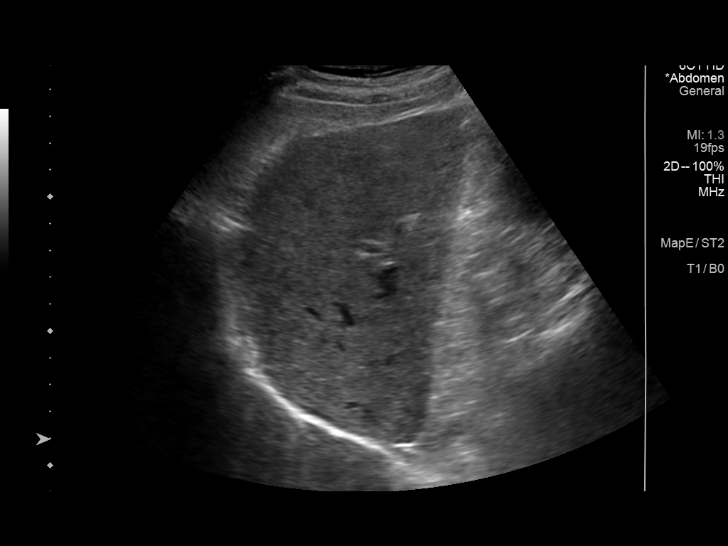
[im 61/61]
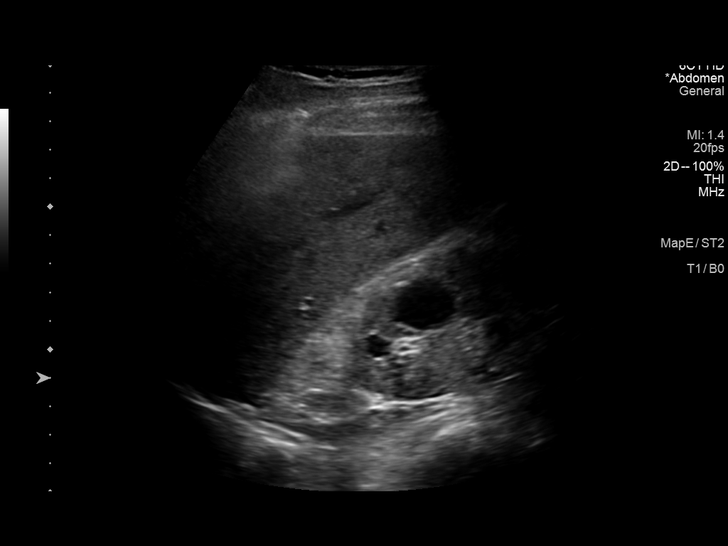

[14 of 25 positions shown; findings below may reference images not displayed]

FINDINGS: Gallbladder:

No gallstones or wall thickening visualized. No sonographic Murphy
sign noted by sonographer.

Common bile duct:

Diameter: 3 mm

Liver:

Four hyperechoic masses are again identified in the right hepatic
lobe. These masses were documented on previous ultrasounds including
[DATE]. Portal vein is patent on color Doppler imaging with
normal direction of blood flow towards the liver.

Other: None.
IMPRESSION: 1. Four hepatic hemangiomas are identified, previously documented.
2. The gallbladder and common bile duct are normal.
3. No other abnormalities are identified.

## 2021-08-01 NOTE — Progress Notes (Signed)
Chronic Care Management Pharmacy Note  08/08/2021 Name:  Russell Russell MRN:  [id number] DOB:  [birth year]  Summary: Initial visit with PharmD.  Main concern is cost of Xarelto.  Unfortunately at this time they have not met any requirements for assistance.  May be able to help them at the end of the year come donut hole time.  Recommendations/Changes made from today's visit: Recommended walking a few times per week  Plan: FU end of year to assure access to all meds due to cost barrier   Subjective: Russell Russell is an 71 y.o. year old male who is a primary patient of Russell Russell, [place].  The CCM team was consulted for assistance with disease management and care coordination needs.    Engaged with patient face to face for initial visit in response to provider referral for pharmacy case management and/or care coordination services.   Consent to Services:  The patient was given the following information about Chronic Care Management services today, agreed to services, and gave verbal consent: 1. CCM service includes personalized support from designated clinical staff supervised by the primary care provider, including individualized plan of care and coordination with other care providers 2. 24/7 contact phone numbers for assistance for urgent and routine care needs. 3. Service will only be billed when office clinical staff spend 20 minutes or more in a month to coordinate care. 4. Only one practitioner may furnish and bill the service in a calendar month. 5.The patient may stop CCM services at any time (effective at the end of the month) by phone call to the office staff. 6. The patient will be responsible for cost sharing (co-pay) of up to 20% of the service fee (after annual deductible is met). Patient agreed to services and consent obtained.  Patient Care Team: Russell Russell, [place] as PCP - General (Physician Assistant) Thompson Grayer, MD as PCP - Electrophysiology (Cardiology) Edythe Clarity, [facility] as Pharmacist (Pharmacist)  Recent office visits:  07/27/2021 OV (PCP) Russell Russell, [place]; Will plan to place Chronic Care Mgmt referral so patient can have discussion with pharmacist regarding Xarelto cost, no medication changes indicated.   04/24/2021 OV (PCP) Russell Coke, PA; no medication changes indicated.   Recent consult visits:  07/26/2021 OV (Physical Medicine) Bayard Hugger, NP; no medication changes indicated.   07/03/2021 OV (Neurology) Frann Rider, NP; no medication changes indicated.   04/10/2021 OV (Cardiology) Shirley Friar, PA-C; no medication changes indicated.   Hospital visits:  None in previous 6 months     Objective:  Lab Results  Component Value Date   CREATININE 1.20 07/27/2021   BUN 12 07/27/2021   GFR 61.66 07/27/2021   EGFR 76 04/10/2021   GFRNONAA 59 (L) 01/27/2020   GFRAA 69 01/27/2020   NA 139 07/27/2021   K 4.7 07/27/2021   CALCIUM 9.8 07/27/2021   CO2 27 07/27/2021   GLUCOSE 120 (H) 07/27/2021    Lab Results  Component Value Date/Time   HGBA1C 6.2 07/27/2021 08:58 AM   HGBA1C 6.4 04/24/2021 10:32 AM   GFR 61.66 07/27/2021 08:58 AM   GFR 63.03 04/24/2021 10:32 AM    Last diabetic Eye exam: No results found for: HMDIABEYEEXA  Last diabetic Foot exam: No results found for: HMDIABFOOTEX   Lab Results  Component Value Date   CHOL 136 04/24/2021   HDL 44.20 04/24/2021   LDLCALC 70 04/24/2021   TRIG 112.0 04/24/2021   CHOLHDL 3 04/24/2021       Latest  Ref Rng & Units 07/27/2021    8:58 AM 04/24/2021   10:32 AM 10/25/2020   11:27 AM  Hepatic Function  Total Protein 6.0 - 8.3 g/dL 7.7   7.2   7.2    Albumin 3.5 - 5.2 g/dL 4.0   4.1   3.9    AST 0 - 37 U/L 57   40   38    ALT 0 - 53 U/L 50   42   41    Alk Phosphatase 39 - 117 U/L 109   128   115    Total Bilirubin 0.2 - 1.2 mg/dL 0.9   0.8   0.7      Lab Results  Component Value Date/Time   TSH 2.209 07/01/2019 03:15 AM   TSH 1.91  02/24/2019 03:05 PM   TSH 2.50 12/18/2017 03:03 PM       Latest Ref Rng & Units 04/24/2021   10:32 AM 04/10/2021   12:47 PM 10/25/2020   11:27 AM  CBC  WBC 4.0 - 10.5 K/uL 4.9   6.4   5.8    Hemoglobin 13.0 - 17.0 g/dL 14.4   15.5   14.1    Hematocrit 39.0 - 52.0 % 44.5   45.9   43.1    Platelets 150.0 - 400.0 K/uL 241.0   275   246.0      No results found for: VD25OH  Clinical ASCVD: Yes  The ASCVD Risk score (Arnett DK, et al., 2019) failed to calculate for the following reasons:   The patient has a prior MI or stroke diagnosis       07/26/2021    8:46 AM 02/17/2021    2:28 PM 01/23/2021   10:06 AM  Depression screen PHQ 2/9  Decreased Interest 0 0 0  Down, Depressed, Hopeless 0 0 0  PHQ - 2 Score 0 0 0      Social History   Tobacco Use  Smoking Status Every Day   Packs/day: 0.50   Years: 40.00   Pack years: 20.00   Types: Cigarettes   Start date: 1974  Smokeless Tobacco Never   BP Readings from Last 3 Encounters:  07/27/21 128/84  07/26/21 120/86  07/03/21 (!) 121/91   Pulse Readings from Last 3 Encounters:  07/27/21 73  07/26/21 81  07/03/21 82   Wt Readings from Last 3 Encounters:  07/27/21 171 lb 3.2 oz (77.7 kg)  07/03/21 171 lb (77.6 kg)  04/24/21 177 lb 8 oz (80.5 kg)   BMI Readings from Last 3 Encounters:  07/27/21 24.56 kg/m  07/26/21 24.54 kg/m  07/03/21 24.54 kg/m    Assessment/Interventions: Review of patient past medical history, allergies, medications, health status, including review of consultants reports, laboratory and other test data, was performed as part of comprehensive evaluation and provision of chronic care management services.   SDOH:  (Social Determinants of Health) assessments and interventions performed: Yes  Financial Resource Strain: Low Risk    Difficulty of Paying Living Expenses: Not hard at all   Food Insecurity: No Food Insecurity   Worried About Charity fundraiser in the Last Year: Never true   Ran Out of  Food in the Last Year: Never true    SDOH Screenings   Alcohol Screen: Not on file  Depression (PHQ2-9): Low Risk    PHQ-2 Score: 0  Financial Resource Strain: Low Risk    Difficulty of Paying Living Expenses: Not hard at all  Food Insecurity: No Food  Insecurity   Worried About Charity fundraiser in the Last Year: Never true   Ran Out of Food in the Last Year: Never true  Housing: Low Risk    Last Housing Risk Score: 0  Physical Activity: Inactive   Days of Exercise per Week: 0 days   Minutes of Exercise per Session: 0 min  Social Connections: Moderately Integrated   Frequency of Communication with Friends and Family: More than three times a week   Frequency of Social Gatherings with Friends and Family: More than three times a week   Attends Religious Services: More than 4 times per year   Active Member of [employer] or Organizations: No   Attends Music therapist: Never   Marital Status: Married  Stress: No Stress Concern Present   Feeling of Stress : Not at all  Tobacco Use: High Risk   Smoking Tobacco Use: Every Day   Smokeless Tobacco Use: Never   Passive Exposure: Not on file  Transportation Needs: No Transportation Needs   Lack of Transportation (Medical): No   Lack of Transportation (Non-Medical): No    CCM Care Plan  Allergies  Allergen Reactions   Penicillins Rash    Medications Reviewed Today     Reviewed by Edythe Clarity, [facility] (Pharmacist) on 08/08/21 at [place] List Status: <None>   Medication Order Taking? Sig Documenting Provider Last Dose Status Informant  acetaminophen (TYLENOL) 325 MG tablet [id number] Yes Take 2 tablets (650 mg total) by mouth every 4 (four) hours as needed for mild pain (or temp > 37.5 C (99.5 F)). Cathlyn Parsons, PA-C Taking Active   atorvastatin (LIPITOR) 40 MG tablet [id number] Yes TAKE 1 TABLET BY MOUTH EVERY DAY [place], [place], [place] Taking Active   folic acid (FOLVITE) 1 MG tablet [id number] Yes TAKE 1 TABLET  BY MOUTH EVERY DAY [place], [place], [place] Taking Active   halobetasol (ULTRAVATE) 0.05 % cream [id number] Yes Apply 1 application topically as needed (skin irritation). [provider] Taking Active   metoprolol tartrate (LOPRESSOR) 25 MG tablet [id number] Yes Take 1 tablet (25 mg total) by mouth daily. Pt needs to make appt with provider for further refills - 1st attempt [place], Jeneen Rinks, MD Taking Active   Multiple Vitamin (MULTIVITAMIN WITH MINERALS) TABS tablet [id number] Yes Take 1 tablet by mouth daily. Cathlyn Parsons, PA-C Taking Active   omeprazole (PRILOSEC) 20 MG capsule [id number] Yes Take 20 mg by mouth as needed (acid reflux). [provider] Taking Active Spouse/Significant Other  rivaroxaban (XARELTO) 20 MG TABS tablet [id number] Yes TAKE 1 TABLET EVERY DAY WITH SUPPER. Thompson Grayer, MD Taking Active   tadalafil (CIALIS) 10 MG tablet [id number] Yes Take 1 tablet (10 mg total) by mouth every other day as needed for erectile dysfunction. Russell Russell, [place] Taking Active             Patient Active Problem List   Diagnosis Date Noted   H/O ETOH abuse 04/28/2020   History of tobacco abuse 01/07/2020   Hemiparesis affecting left side as late effect of stroke ([place]) 01/07/2020   Abnormality of gait 09/24/2019   AKI (acute kidney injury) ([place])    Fever    Sepsis without acute organ dysfunction (HCC)    Elevated BUN    Blood pressure increase diastolic    Labile blood pressure    Leukopenia    Benign essential HTN    Hypoalbuminemia    Cerebrovascular accident (CVA) of right basal ganglia ([place]) 07/07/2019  Acute ischemic stroke ([place])    Dyslipidemia    Dysphagia, post-stroke    Polycythemia    Stroke ([place]) 06/30/2019   Prediabetes 12/23/2016   Elevated LFTs 12/23/2016   Pure hypercholesterolemia 12/21/2016   Alcoholism ([place]) 12/21/2016   Eczema 09/18/2013   Essential hypertension 09/18/2013   Gastroesophageal reflux disease without esophagitis 09/18/2013    Tobacco abuse 09/18/2013    Immunization History  Administered Date(s) Administered   Tdap 03/12/2006    Conditions to be addressed/monitored:  HTN, Hx of Stroke, GERD, HLD, Pre-DM,   Care Plan : General Pharmacy (Adult)  Updates made by Edythe Clarity, RPH since 08/08/2021 12:00 AM     Problem: HTN, Hx of Stroke, GERD, HLD, Pre-DM,   Priority: High  Onset Date: 08/08/2021     Long-Range Goal: Patient-Specific Goal   Start Date: 08/08/2021  Expected End Date: 02/08/2022  This Visit's Progress: On track  Priority: High  Note:   Current Barriers:  Unable to independently afford treatment regimen  Pharmacist Clinical Goal(s):  Patient will verbalize ability to afford treatment regimen achieve adherence to monitoring guidelines and medication adherence to achieve therapeutic efficacy contact provider office for questions/concerns as evidenced notation of same in electronic health record through collaboration with PharmD and provider.   Interventions: 1:1 collaboration with Russell Coke, PA regarding development and update of comprehensive plan of care as evidenced by provider attestation and co-signature Inter-disciplinary care team collaboration (see longitudinal plan of care) Comprehensive medication review performed; medication list updated in electronic medical record  Hypertension (BP goal <130/80) -Controlled -Current treatment: Metoprolol tartrate 17m daily Appropriate, Effective, Safe, Accessible -Medications previously tried: amlodipine, lisinopril -Current home readings: "normal" -Current dietary habits: regular diet, no specific restrictions -Current exercise habits: minimal -Denies hypotensive/hypertensive symptoms -Educated on BP goals and benefits of medications for prevention of heart attack, stroke and kidney damage; Daily salt intake goal < 2300 mg; Exercise goal of 150 minutes per week; -Counseled to monitor BP at home a few times per week ,  document, and provide log at future appointments -.  Hyperlipidemia: (LDL goal < 70, possible < 55) -Controlled -Current treatment: Atorvastatin 43mAppropriate, Effective, Safe, Accessible  -Medications previously tried: none noted   -Educated on Cholesterol goals;  Benefits of statin for ASCVD risk reduction; Importance of limiting foods high in cholesterol; -Recommended to continue current medication Tolerating well, LDL is currently 70.  Could treat to goal < 55 given his history, we will continue medication for now.  Pre-Diabetes (A1c goal <6.5%) -Controlled -Current medications: None noted -Medications previously tried: none ntoed  -Current home glucose readings fasting glucose: not checking  post prandial glucose: not checking -Denies hypoglycemic/hyperglycemic symptoms -Educated on A1c and blood sugar goals; -Counseled to check feet daily and get yearly eye exams -Counseled on diet and exercise extensively Recommended to continue current medication  Hx of Stroke/CVA (Goal: Reduce CV risk) -Controlled -Current treatment  Xarelto 2022mppropriate, Effective, Safe, Accessible -Medications previously tried: none noted -Discussed cost barrier of Xarelto, especially as they enter the donut hole.  Unfortunately there is no option for Xarelto besides the [place] this time which will only benefit them when he is in donut hole.  Gave them my direct line, instructed to call me if he gets into donut hole again.  Could provide samples or assist with application for final month of the year.  -Recommended to continue current medication    Patient Goals/Self-Care Activities Patient will:  - take medications as prescribed as  evidenced by patient report and record review collaborate with provider on medication access solutions  Follow Up Plan: The care management team will reach out to the patient again over the next 365 days.        Medication Assistance: None required.   Patient affirms current coverage meets needs.  Compliance/Adherence/Medication fill history: Care Gaps: None  Star-Rating Drugs: Atorvastatin 40 mg last filled 05/25/2021 90 DS  Patient's preferred pharmacy is:  [id number] [place], [place] - [address] [place] [address]Phone: [phone]: [phone] [place]un[place]Phone: [phone]: [phone] Uses pill box? Yes Pt endorses 100% compliance  We discussed: Benefits of medication synchronization, packaging and delivery as well as enhanced pharmacist oversight with [employer]. Patient decided to: Continue current medication management strategy  Care Plan and Follow Up Patient Decision:  Patient agrees to Care Plan and Follow-up.  Plan: The care management team will reach out to the patient again over the next 365 days.  CBeverly Milch PharmD Clinical Pharmacist  [phone]

## 2021-08-04 ENCOUNTER — Telehealth: Payer: Self-pay

## 2021-08-04 NOTE — Telephone Encounter (Signed)
Patient spouse has called back in regard to VM.  I have given her Samantha's response to [place].   Spouse understood.

## 2021-08-08 ENCOUNTER — Ambulatory Visit (INDEPENDENT_AMBULATORY_CARE_PROVIDER_SITE_OTHER): Payer: Medicare HMO | Admitting: Pharmacist

## 2021-08-08 ENCOUNTER — Other Ambulatory Visit: Payer: Self-pay | Admitting: *Deleted

## 2021-08-08 DIAGNOSIS — Z87891 Personal history of nicotine dependence: Secondary | ICD-10-CM

## 2021-08-08 DIAGNOSIS — R7303 Prediabetes: Secondary | ICD-10-CM

## 2021-08-08 DIAGNOSIS — Z122 Encounter for screening for malignant neoplasm of respiratory organs: Secondary | ICD-10-CM

## 2021-08-08 DIAGNOSIS — E7800 Pure hypercholesterolemia, unspecified: Secondary | ICD-10-CM

## 2021-08-08 DIAGNOSIS — I10 Essential (primary) hypertension: Secondary | ICD-10-CM

## 2021-08-08 DIAGNOSIS — F17210 Nicotine dependence, cigarettes, uncomplicated: Secondary | ICD-10-CM

## 2021-08-08 NOTE — Telephone Encounter (Signed)
Noted  

## 2021-08-08 NOTE — Patient Instructions (Addendum)
Visit Information   Goals Addressed             This Visit's Progress    Lifestyle Change-Hypertension       Timeframe:  Long-Range Goal Priority:  High Start Date:  08/08/21                           Expected End Date:  02/08/22                     Follow Up Date 11/08/21    Work to implement walking into lifestyle.  Start slow and work your way up!    Why is this important?   The changes that you are asked to make may be hard to do.  This is especially true when the changes are life-long.  Knowing why it is important to you is the first step.  Working on the change with your family or support person helps you not feel alone.  Reward yourself and family or support person when goals are met. This can be an activity you choose like bowling, hiking, biking, swimming or shooting hoops.     Notes:        Patient Care Plan: General Pharmacy (Adult)     Problem Identified: HTN, Hx of Stroke, GERD, HLD, Pre-DM,   Priority: High  Onset Date: 08/08/2021     Long-Range Goal: Patient-Specific Goal   Start Date: 08/08/2021  Expected End Date: 02/08/2022  This Visit's Progress: On track  Priority: High  Note:   Current Barriers:  Unable to independently afford treatment regimen  Pharmacist Clinical Goal(s):  Patient will verbalize ability to afford treatment regimen achieve adherence to monitoring guidelines and medication adherence to achieve therapeutic efficacy contact provider office for questions/concerns as evidenced notation of same in electronic health record through collaboration with PharmD and provider.   Interventions: 1:1 collaboration with Inda Coke, PA regarding development and update of comprehensive plan of care as evidenced by provider attestation and co-signature Inter-disciplinary care team collaboration (see longitudinal plan of care) Comprehensive medication review performed; medication list updated in electronic medical record  Hypertension (BP  goal <130/80) -Controlled -Current treatment: Metoprolol tartrate 48m daily Appropriate, Effective, Safe, Accessible -Medications previously tried: amlodipine, lisinopril -Current home readings: "normal" -Current dietary habits: regular diet, no specific restrictions -Current exercise habits: minimal -Denies hypotensive/hypertensive symptoms -Educated on BP goals and benefits of medications for prevention of heart attack, stroke and kidney damage; Daily salt intake goal < 2300 mg; Exercise goal of 150 minutes per week; -Counseled to monitor BP at home a few times per week , document, and provide log at future appointments -.  Hyperlipidemia: (LDL goal < 70, possible < 55) -Controlled -Current treatment: Atorvastatin 459mAppropriate, Effective, Safe, Accessible  -Medications previously tried: none noted   -Educated on Cholesterol goals;  Benefits of statin for ASCVD risk reduction; Importance of limiting foods high in cholesterol; -Recommended to continue current medication Tolerating well, LDL is currently 70.  Could treat to goal < 55 given his history, we will continue medication for now.  Pre-Diabetes (A1c goal <6.5%) -Controlled -Current medications: None noted -Medications previously tried: none ntoed  -Current home glucose readings fasting glucose: not checking  post prandial glucose: not checking -Denies hypoglycemic/hyperglycemic symptoms -Educated on A1c and blood sugar goals; -Counseled to check feet daily and get yearly eye exams -Counseled on diet and exercise extensively Recommended to continue current medication  Hx of Stroke/CVA (  Goal: Reduce CV risk) -Controlled -Current treatment  Xarelto 19m Appropriate, Effective, Safe, Accessible -Medications previously tried: none noted -Discussed cost barrier of Xarelto, especially as they enter the donut hole.  Unfortunately there is no option for Xarelto besides the [place] this time which will only  benefit them when he is in donut hole.  Gave them my direct line, instructed to call me if he gets into donut hole again.  Could provide samples or assist with application for final month of the year.  -Recommended to continue current medication    Patient Goals/Self-Care Activities Patient will:  - take medications as prescribed as evidenced by patient report and record review collaborate with provider on medication access solutions  Follow Up Plan: The care management team will reach out to the patient again over the next 365 days.       Mr. IMaglionewas given information about Chronic Care Management services today including:  CCM service includes personalized support from designated clinical staff supervised by his physician, including individualized plan of care and coordination with other care providers 24/7 contact phone numbers for assistance for urgent and routine care needs. Standard insurance, coinsurance, copays and deductibles apply for chronic care management only during months in which we provide at least 20 minutes of these services. Most insurances cover these services at 100%, however patients may be responsible for any copay, coinsurance and/or deductible if applicable. This service may help you avoid the need for more expensive face-to-face services. Only one practitioner may furnish and bill the service in a calendar month. The patient may stop CCM services at any time (effective at the end of the month) by phone call to the office staff.  Patient agreed to services and verbal consent obtained.   The patient verbalized understanding of instructions, educational materials, and care plan provided today and agreed to receive a mailed copy of patient instructions, educational materials, and care plan.  Telephone follow up appointment with pharmacy team member scheduled for: 1 year  CEdythe Clarity RMelcher-Dallas PharmD Clinical Pharmacist  [facility]([phone]

## 2021-08-09 DIAGNOSIS — F17210 Nicotine dependence, cigarettes, uncomplicated: Secondary | ICD-10-CM | POA: Diagnosis not present

## 2021-08-09 DIAGNOSIS — E785 Hyperlipidemia, unspecified: Secondary | ICD-10-CM

## 2021-08-09 DIAGNOSIS — I10 Essential (primary) hypertension: Secondary | ICD-10-CM | POA: Diagnosis not present

## 2021-08-11 NOTE — Progress Notes (Signed)
[facility]link Summary Report / Loop Recorder 

## 2021-08-16 ENCOUNTER — Ambulatory Visit: Payer: Medicare HMO | Admitting: Physician Assistant

## 2021-08-16 ENCOUNTER — Telehealth: Payer: Self-pay | Admitting: Physician Assistant

## 2021-08-16 NOTE — Telephone Encounter (Signed)
Noted  

## 2021-08-16 NOTE — Telephone Encounter (Signed)
Triage advised to see PCP within 24 hrs. Pt does not want to come in today but is scheduled 08/17/21 at 10:20 am with [facility]. Waiting on triage notes

## 2021-08-16 NOTE — Telephone Encounter (Signed)
Patient Name: Russell Russell Gender: Male DOB: Apr 06, 1950 Age: 71 Y 17 M 26 D Return Phone Number: [phone] (Primary), [phone] (Secondary) Address: City/ State/ Zip: [place]  [address] Client [facility] [place] Client Site [place] [facility] [place] Day Provider Morene Rankins, Eunice Type Call Who Is Calling Patient / Member / Family / Caregiver Call Type Triage / Clinical Caller Name Darlene Bartelt Relationship To Patient Spouse Return Phone Number [phone] (Primary) Chief Complaint Vomiting Reason for Call Symptomatic / Request for Advance stated that their husband has been vomiting and has a loss of appetite. Caller stated that she is unsure if there was blood in the vomit but he has urinated in the last 8 hours. Translation No Nurse Assessment Nurse: Nicki Reaper, RN, Malachy Mood Date/Time (Eastern Time): 08/16/2021 1:15:40 PM Confirm and document reason for call. If symptomatic, describe symptoms. ---Caller states her husband started vomiting 2 nights ago and is only vomiting at night, vomited 2 times the night before and 1 time last night, no vomiting today, had abdomen pain 2 nights ago, denies pain now, denies diarrhea, fever, is drinking and voiding, Does the patient have any new or worsening symptoms? ---Yes Will a triage be completed? ---Yes Related visit to physician within the last 2 weeks? ---No Does the PT have any chronic conditions? (i.e. diabetes, asthma, this includes High risk factors for pregnancy, etc.) ---Yes List chronic conditions. ---HTN HX of stroke Is this a behavioral health or substance abuse call? ---No Guidelines Guideline Title Affirmed Question Affirmed Notes Nurse Date/Time (Eastern Time) Vomiting [1] MILD or MODERATE vomiting AND [2] Baltazar Apo 08/16/2021 1:19:28 PM  Guidelines Guideline Title Affirmed Question Affirmed Notes Nurse  Date/Time Eilene Ghazi Time) present > 48 hours (2 days) (Exception: Mild vomiting with associated diarrhea.) Disp. Time Eilene Ghazi Time) Disposition Final User 08/16/2021 1:22:29 PM See PCP within 24 Hours Yes Nicki Reaper, RN, Erskine Speed Disagree/Comply Comply Caller Understands Yes PreDisposition Call Doctor Care Advice Given Per Guideline SEE PCP WITHIN 24 HOURS: DRINK CLEAR FLUIDS: * Sip water or rehydration liquid (Gatorade or Powerade). CALL BACK IF: * Vomit contains bile (green color) * Constant stomach pain lasting more than 2 hours * Signs of dehydration (e.g., no urination over 12 hours, very lightheaded) * You become worse Comments User: Burna Sis, RN Date/Time Eilene Ghazi Time): 08/16/2021 1:22:59 PM Has appt scheduled today at 340 pm Referrals REFERRED TO PCP OFFICE

## 2021-08-16 NOTE — Telephone Encounter (Signed)
Pt's wife states pt has vomiting but has had a complete loss of appetite. She states he has not expressed he had any other symptoms. Provider stated pt needs to be triaged. Currently being triaged.

## 2021-08-17 ENCOUNTER — Encounter: Payer: Self-pay | Admitting: Physician Assistant

## 2021-08-17 ENCOUNTER — Ambulatory Visit (HOSPITAL_BASED_OUTPATIENT_CLINIC_OR_DEPARTMENT_OTHER)
Admission: RE | Admit: 2021-08-17 | Discharge: 2021-08-17 | Disposition: A | Discharge status: Home / Self Care | Payer: Medicare HMO | Source: Ambulatory Visit | Attending: Physician Assistant | Admitting: Physician Assistant

## 2021-08-17 ENCOUNTER — Ambulatory Visit (INDEPENDENT_AMBULATORY_CARE_PROVIDER_SITE_OTHER): Payer: Medicare HMO | Admitting: Physician Assistant

## 2021-08-17 VITALS — BP 110/70 | HR 77 | Temp 97.7°F | Ht 70.0 in | Wt 166.5 lb

## 2021-08-17 DIAGNOSIS — Z125 Encounter for screening for malignant neoplasm of prostate: Secondary | ICD-10-CM | POA: Diagnosis not present

## 2021-08-17 DIAGNOSIS — R112 Nausea with vomiting, unspecified: Secondary | ICD-10-CM

## 2021-08-17 DIAGNOSIS — N3289 Other specified disorders of bladder: Secondary | ICD-10-CM | POA: Diagnosis not present

## 2021-08-17 DIAGNOSIS — N400 Enlarged prostate without lower urinary tract symptoms: Secondary | ICD-10-CM | POA: Diagnosis not present

## 2021-08-17 DIAGNOSIS — R6889 Other general symptoms and signs: Secondary | ICD-10-CM

## 2021-08-17 DIAGNOSIS — N281 Cyst of kidney, acquired: Secondary | ICD-10-CM | POA: Diagnosis not present

## 2021-08-17 DIAGNOSIS — K429 Umbilical hernia without obstruction or gangrene: Secondary | ICD-10-CM | POA: Diagnosis not present

## 2021-08-17 LAB — COMPREHENSIVE METABOLIC PANEL
ALT: 52 U/L (ref 0–53)
AST: 45 U/L — ABNORMAL HIGH (ref 0–37)
Albumin: 4.1 g/dL (ref 3.5–5.2)
Alkaline Phosphatase: 112 U/L (ref 39–117)
BUN: 8 mg/dL (ref 6–23)
CO2: 28 mEq/L (ref 19–32)
Calcium: 9.7 mg/dL (ref 8.4–10.5)
Chloride: 106 mEq/L (ref 96–112)
Creatinine, Ser: 1.13 mg/dL (ref 0.40–1.50)
GFR: 66.25 mL/min (ref >60.00)
Glucose, Bld: 104 mg/dL — ABNORMAL HIGH (ref 70–99)
Potassium: 4.5 mEq/L (ref 3.5–5.1)
Sodium: 140 mEq/L (ref 135–145)
Total Bilirubin: 0.8 mg/dL (ref 0.2–1.2)
Total Protein: 7.2 g/dL (ref 6.0–8.3)

## 2021-08-17 LAB — CBC WITH DIFFERENTIAL/PLATELET
Basophils Absolute: 0 10*3/uL (ref 0.0–0.1)
Basophils Relative: 0.9 % (ref 0.0–3.0)
Eosinophils Absolute: 0.2 10*3/uL (ref 0.0–0.7)
Eosinophils Relative: 4.8 % (ref 0.0–5.0)
HCT: 44.1 % (ref 39.0–52.0)
Hemoglobin: 14.7 g/dL (ref 13.0–17.0)
Lymphocytes Relative: 41.2 % (ref 12.0–46.0)
Lymphs Abs: 2.1 10*3/uL (ref 0.7–4.0)
MCHC: 33.3 g/dL (ref 30.0–36.0)
MCV: 94.7 fl (ref 78.0–100.0)
Monocytes Absolute: 0.5 10*3/uL (ref 0.1–1.0)
Monocytes Relative: 9.5 % (ref 3.0–12.0)
Neutro Abs: 2.3 10*3/uL (ref 1.4–7.7)
Neutrophils Relative %: 43.6 % (ref 43.0–77.0)
Platelets: 240 10*3/uL (ref 150.0–400.0)
RBC: 4.66 Mil/uL (ref 4.22–5.81)
RDW: 15.2 % (ref 11.5–15.5)
WBC: 5.2 10*3/uL (ref 4.0–10.5)

## 2021-08-17 LAB — H. PYLORI ANTIBODY, IGG: H Pylori IgG: NEGATIVE

## 2021-08-17 LAB — PSA: PSA: 0.84 ng/mL (ref 0.10–4.00)

## 2021-08-17 LAB — LIPASE: Lipase: 20 U/L (ref 11.0–59.0)

## 2021-08-17 IMAGING — CT CT ABD-PELV W/ CM
2 of 5 series · 13 of 46 positions shown, 15 images · IV contrast (APPLIED)
Comparison: CT abdomen and pelvis [DATE] and chest CT
[DATE]

CLINICAL DATA: Nausea and vomiting.  Decreased appetite.

EXAM:
CT ABDOMEN AND PELVIS WITH CONTRAST
TECHNIQUE: Multidetector CT imaging of the abdomen and pelvis was performed
using the standard protocol following bolus administration of
intravenous contrast.

[Series 2: abd pel w · axial · 0.67mm/px · z∈[-280,+160]mm · 10 of 98 slices shown, 12 images]
[im 5/98  soft-tissue]
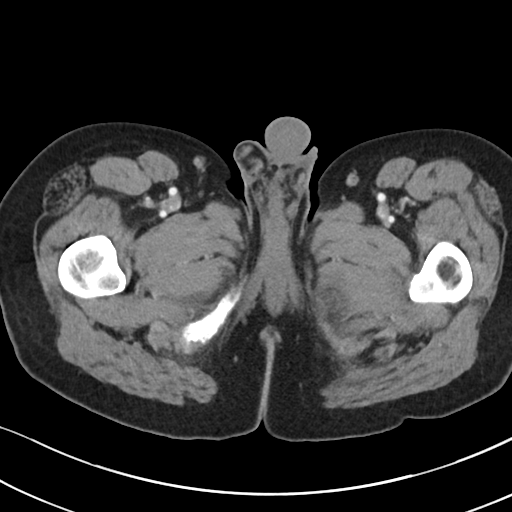
[im 5/98  bone]
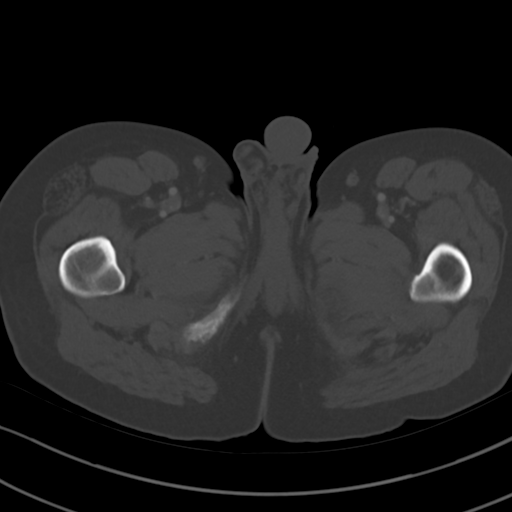
[im 15/98  soft-tissue]
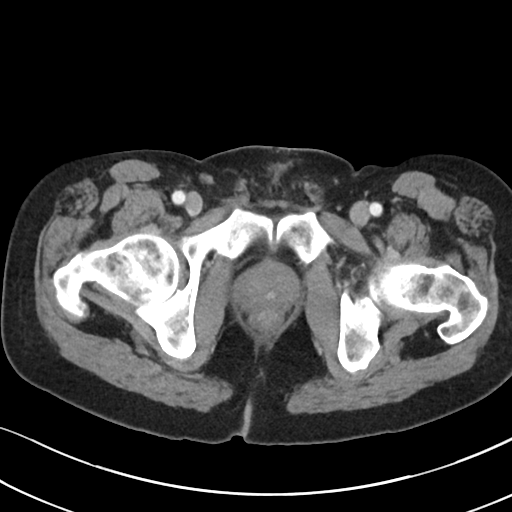
[im 25/98  soft-tissue]
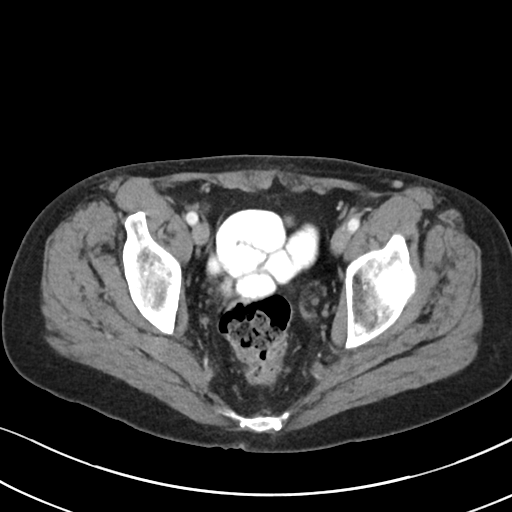
[im 34/98  soft-tissue]
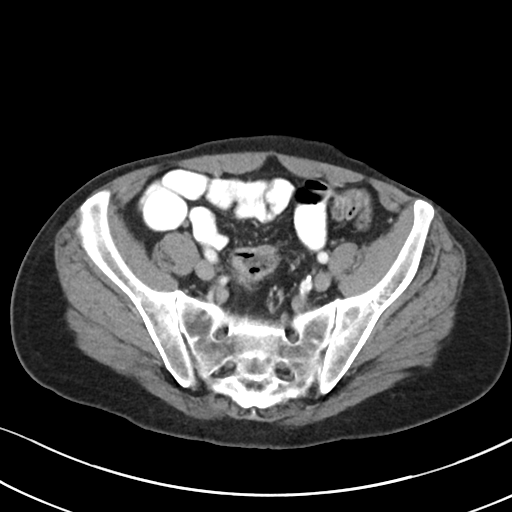
[im 44/98  soft-tissue]
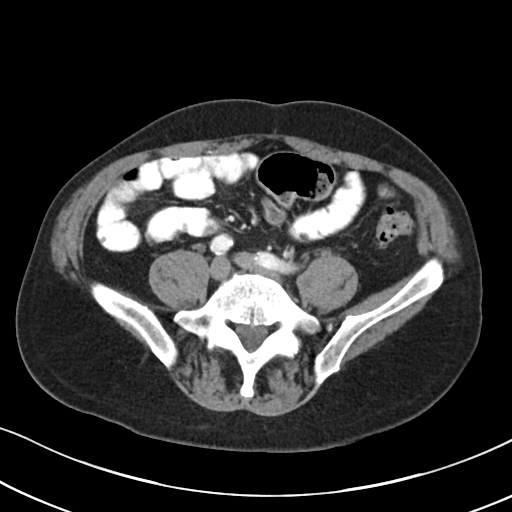
[im 54/98  soft-tissue]
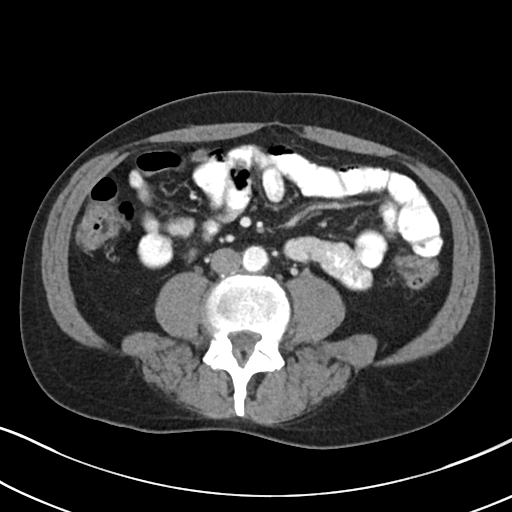
[im 64/98  soft-tissue]
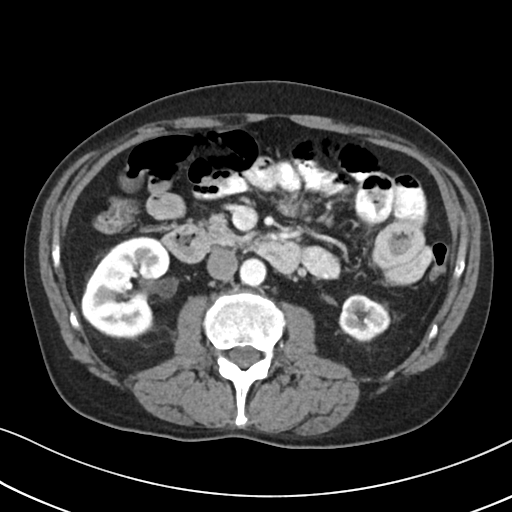
[im 73/98  soft-tissue]
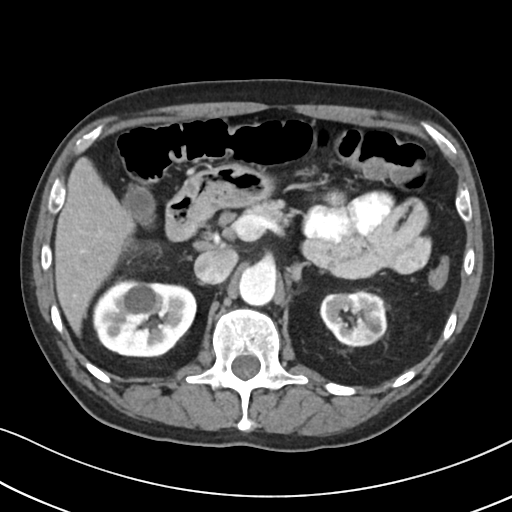
[im 83/98  soft-tissue]
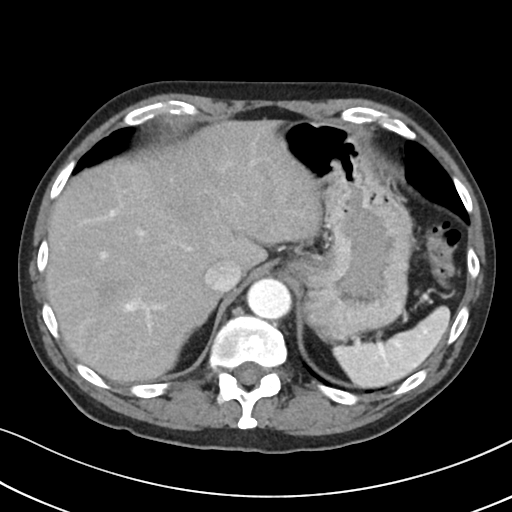
[im 83/98  bone]
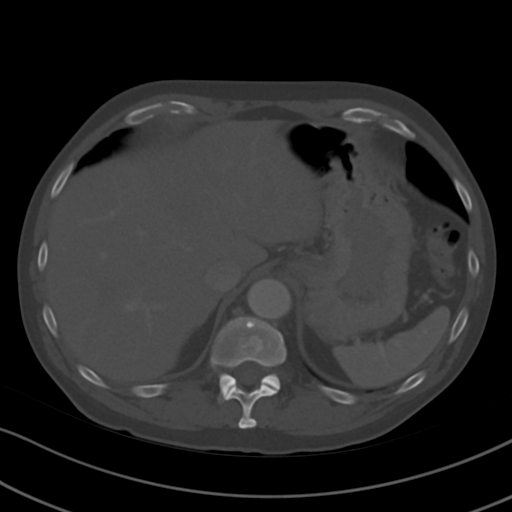
[im 93/98  soft-tissue]
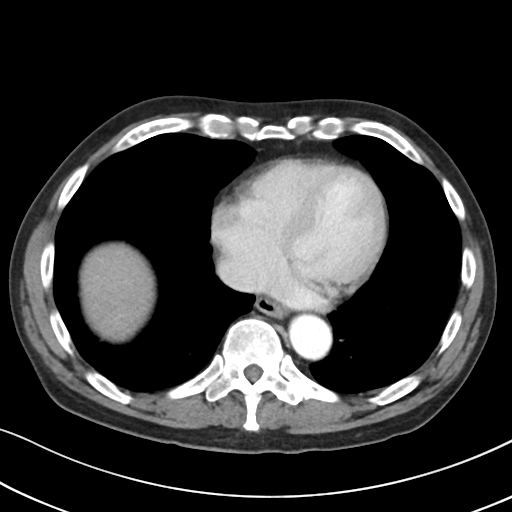

[Series 5: coronal · coronal · 0.61mm/px · 3 of 84 slices shown]
[im 28/84  soft-tissue]
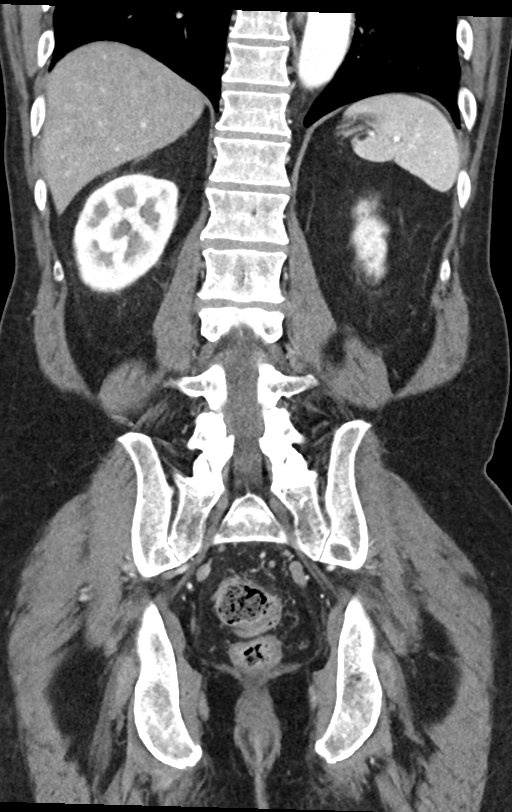
[im 37/84  soft-tissue]
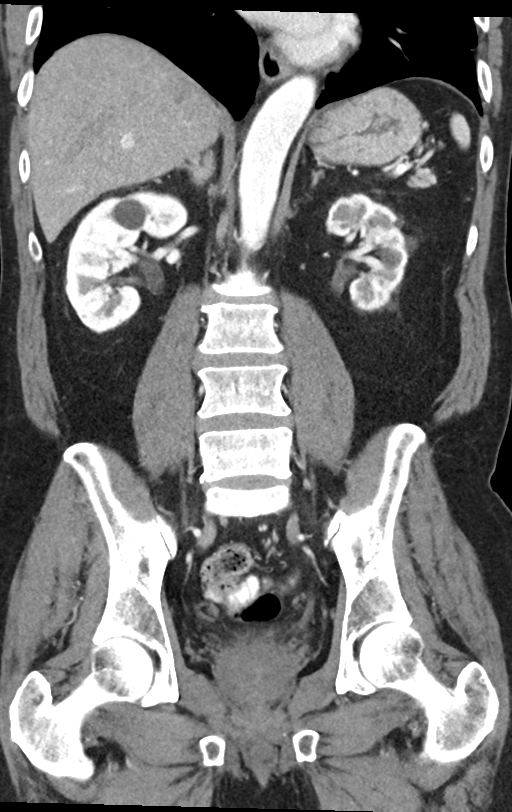
[im 47/84  soft-tissue]
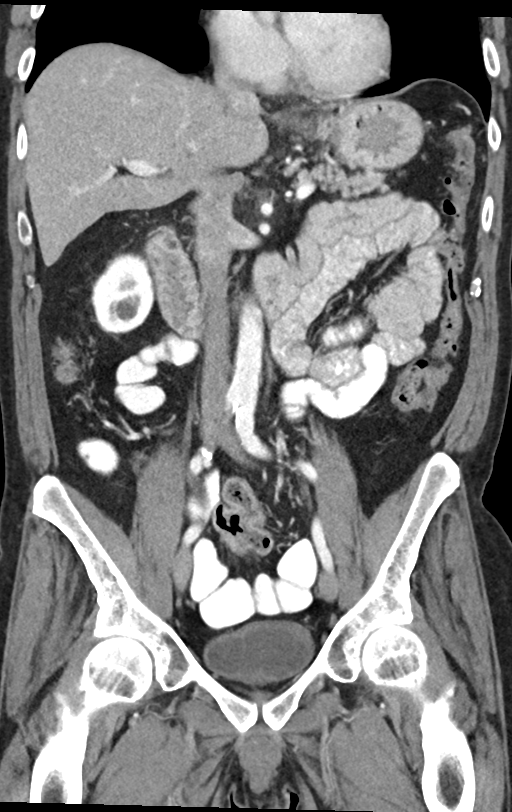

[13 of 46 positions shown; findings below may reference images not displayed]

RADIATION DOSE REDUCTION: This exam was performed according to the
departmental dose-optimization program which includes automated
exposure control, adjustment of the mA and/or kV according to
patient size and/or use of iterative reconstruction technique.

CONTRAST:  100mL OMNIPAQUE IOHEXOL 300 MG/ML  SOLN
FINDINGS: Lower chest: Subpleural nodule in the left lower lobe measures 4 mm
on sequence 4, image number 8 and this appears chronic and most
compatible with a benign finding. Few patchy densities in the left
lower lobe could represent atelectasis or scarring. There is an
additional small subpleural nodule in the left lower lobe measuring
3 mm on image 5 that may be new.

Hepatobiliary: Again noted is a lesion near the left hepatic dome on
sequence 2 image 12 that measures up to 1.3 cm. This structure was
present on the exam from [4U] and there appears to be some
asymmetric peripheral enhancement. There is another small lesion at
the hepatic dome on image 8, sequence 2 that measures up to 1.3 cm
and it was also present on the previous chest CT from [4U]. These
hepatic structures have not significantly changed in over 2 years
and suspect this could represent small cavernous hemangiomas based
on the morphology. No biliary dilatation. Normal appearance of the
gallbladder without distension. Portal venous system is patent.

Pancreas: Unremarkable. No pancreatic ductal dilatation or
surrounding inflammatory changes.

Spleen: Normal in size without focal abnormality.

Adrenals/Urinary Tract: Normal adrenal glands. Left kidney is
atrophic and this is chronic finding. Indeterminate lesion in the
anterior left kidney on the delayed images, sequence 7, image 13
measures 1.0 cm and the Hounsfield units measure up to 90. In
retrospect, this structure was present on the exam [4U] and has
minimally changed in size or configuration. There are multiple
low-density cysts throughout the left kidney. Multiple low-density
cysts in the right kidney. No hydronephrosis. Small amount of fluid
in the urinary bladder. Mild wall thickening in the urinary bladder
but similar to the previous examination.

Stomach/Bowel: Normal appearance of the stomach. No focal bowel
inflammation. Small normal appearing appendix. No evidence for bowel
obstruction.

Vascular/Lymphatic: Atherosclerotic calcifications at in the iliac
arteries. No aortic aneurysm. No lymph node enlargement in the
abdomen or pelvis.

Reproductive: Prostate is prominent for size measuring 4.9 cm in the
transverse dimension.

Other: Negative for ascites. Small umbilical hernia containing fat.
Negative for free air.

Musculoskeletal: No acute bone abnormality.
IMPRESSION: 1. No acute abnormality in the abdomen or pelvis.
2. **An incidental finding of potential clinical significance has
been found. Indeterminate structure along the anterior left kidney.
Although this structure has not significantly changed since [4U], an
indolent enhancing renal lesion cannot be excluded. Recommend
further characterization with an abdominal MRI, with and without
contrast.**
3. Prostate enlargement. Mild bladder wall thickening is possibly
related to the prostate enlargement.
4. Bilateral renal cysts.
5. Stable small indeterminate lesions at the hepatic dome. These are
probably incidental findings such as hemangiomas. This area could
also be evaluated with the abdominal MRI.
6. At least 1 new small subpleural nodule in the left lower lobe. No
follow-up needed if patient is low-risk. Non-contrast chest CT can
be considered in 12 months if patient is high-risk. This
recommendation follows the consensus statement: Guidelines for
Management of Incidental Pulmonary Nodules Detected on CT Images:

## 2021-08-17 MED ORDER — IOHEXOL 300 MG/ML  SOLN
100.0000 mL | Freq: Once | INTRAMUSCULAR | Status: AC | PRN
  Administered 2021-08-17: 100 mL via INTRAVENOUS

## 2021-08-17 NOTE — Telephone Encounter (Signed)
See Triage note, appt today.

## 2021-08-17 NOTE — Progress Notes (Signed)
Russell Russell is a 71 y.o. male here for a follow up of a pre-existing problem.  History of Present Illness:   Chief Complaint  Patient presents with  . c/o loss of appetite    He has had loss of appetite x 1 month.  . Vomiting    Pt has vomited x 2 this week, happens a night.    HPI  Loss of appetite Patient complains of decreased appetite for the past 1 month. He is here with his wife today. States he has not been eating well for the past few weeks. Reports he only eats bowl of cereal and orange juice throughout the whole day, or may have 1/2 an egg sandwich in the morning as well. He has been drinking 2 small Gatorades a day. He has lost some weight since the last visit approximately 6 lb. He is due for lung cancer screening and states he is having appointment in few weeks. Denies abdominal pain. Denies any urinary symptoms. Denies fever or chills. Denies cough. No constipation or diarrhea.   Vomiting/Nausea Patient states he has had 2 episodes of this for the past 2 weeks. He continues to drink alcohol regularly. Per wife, he had two beer at night and then vomited after waking up in the morning. Had similar issue last week where he had half beer at night and vomited again in the morning. States he has been having regular bowel movement. Denies hematochezia or hematemesis. No constipation or diarrhea. No fever or chills.   Past Medical History:  Diagnosis Date  . Alcohol abuse    drinks 1 gallon of Gin every weekend, nothing during the week x >15 yrs  . Elevated transaminase level    +elev bili: abd u/s 06/2014 showed stable small hepatic hemangiomas, o/w normal.  . Essential hypertension   . GERD (gastroesophageal reflux disease)   . Hyperlipidemia 03/2014   Atorv started-chol improved  . Impaired fasting glucose 03/2014  . Nephrolithiasis   . PUD (peptic ulcer disease)   . Stroke ([place])   . Tobacco dependence    chantix: "psych effects"     Social History   Tobacco Use  .  Smoking status: Every Day    Packs/day: 0.50    Years: 40.00    Total pack years: 20.00    Types: Cigarettes    Start date: 61  . Smokeless tobacco: Never  Vaping Use  . Vaping Use: Never used  Substance Use Topics  . Alcohol use: Yes    Alcohol/week: 8.0 standard drinks of alcohol    Types: 8 Cans of beer per week    Comment: Weekends only  . Drug use: No    Past Surgical History:  Procedure Laterality Date  . COLONOSCOPY  2005   Recall 10 yrs ([place])  . LOOP RECORDER INSERTION N/A 07/03/2019   Procedure: LOOP RECORDER INSERTION;  Surgeon: Thompson Grayer, MD;  Location: [facility];  Service: Cardiovascular;  Laterality: N/A;  . removal of kidney stones  2006   cystoscopic--removed from ureter.  No prob since.    Family History  Problem Relation Age of Onset  . Colon cancer Mother   . Cancer Father   . Breast cancer Sister   . Breast cancer Sister     Allergies  Allergen Reactions  . Penicillins Rash    Current Medications:   Current Outpatient Medications:  .  acetaminophen (TYLENOL) 325 MG tablet, Take 2 tablets (650 mg total) by mouth every 4 (  four) hours as needed for mild pain (or temp > 37.5 C (99.5 F))., Disp:  , Rfl:  .  atorvastatin (LIPITOR) 40 MG tablet, TAKE 1 TABLET BY MOUTH EVERY DAY, Disp: 90 tablet, Rfl: 1 .  folic acid (FOLVITE) 1 MG tablet, TAKE 1 TABLET BY MOUTH EVERY DAY, Disp: 90 tablet, Rfl: 1 .  halobetasol (ULTRAVATE) 0.05 % cream, Apply 1 application topically as needed (skin irritation)., Disp: , Rfl:  .  metoprolol tartrate (LOPRESSOR) 25 MG tablet, Take 1 tablet (25 mg total) by mouth daily. Pt needs to make appt with provider for further refills - 1st attempt, Disp: 30 tablet, Rfl: 0 .  Multiple Vitamin (MULTIVITAMIN WITH MINERALS) TABS tablet, Take 1 tablet by mouth daily., Disp:  , Rfl:  .  omeprazole (PRILOSEC) 20 MG capsule, Take 20 mg by mouth as needed (acid reflux)., Disp: , Rfl:  .  rivaroxaban (XARELTO) 20 MG TABS  tablet, TAKE 1 TABLET EVERY DAY WITH SUPPER., Disp: 90 tablet, Rfl: 0 .  tadalafil (CIALIS) 10 MG tablet, Take 1 tablet (10 mg total) by mouth every other day as needed for erectile dysfunction. (Patient not taking: Reported on 08/17/2021), Disp: 10 tablet, Rfl: 1   Review of Systems:   ROS Negative unless otherwise specified per HPI.   Vitals:   Vitals:   08/17/21 1016  BP: 110/70  Pulse: 77  Temp: 97.7 F (36.5 C)  TempSrc: Temporal  SpO2: 99%  Weight: 166 lb 8 oz (75.5 kg)  Height: '5\' 10"'$  (1.778 m)     Body mass index is 23.89 kg/m.  Physical Exam:   Physical Exam Vitals and nursing note reviewed.  Constitutional:      General: He is not in acute distress.    Appearance: He is well-developed. He is not ill-appearing or toxic-appearing.  Cardiovascular:     Rate and Rhythm: Normal rate and regular rhythm.     Pulses: Normal pulses.     Heart sounds: Normal heart sounds, S1 normal and S2 normal.  Pulmonary:     Effort: Pulmonary effort is normal.     Breath sounds: Normal breath sounds.  Abdominal:     General: Abdomen is flat. Bowel sounds are normal.     Palpations: Abdomen is soft.  Skin:    General: Skin is warm and dry.  Neurological:     Mental Status: He is alert.     GCS: GCS eye subscore is 4. GCS verbal subscore is 5. GCS motor subscore is 6.  Psychiatric:        Speech: Speech normal.        Behavior: Behavior normal. Behavior is cooperative.     Assessment and Plan:   Nausea and vomiting, unspecified vomiting type; unintentional weight change Unclear etiology Update blood work and urine Order stat ct abd/pel for evaluation Encouraged follow-up with pulm for lung cancer screening Drink 64 oz water daily Limit alcohol If any new/worsening symptoms, needs to go to the ER  Prostate cancer screening Updated today  I,Savera Zaman,acting as a scribe for [employer], PA.,have documented all relevant documentation on the behalf of Inda Coke, PA,as directed by  Inda Coke, PA while in the presence of Inda Coke, [place].   I, Inda Coke, [place], have reviewed all documentation for this visit. The documentation on 08/17/21 for the exam, diagnosis, procedures, and orders are all accurate and complete.  Time spent with patient today was 28 minutes which consisted of chart review, discussing diagnosis, work  up, treatment answering questions and documentation.  Inda Coke, PA-C

## 2021-08-17 NOTE — Patient Instructions (Signed)
It was great to see you!  Please drink 64 oz water daily if you can  Bland foods, small and frequent snacks if you can  CT of abdomen/pelvis  today Blood work today Bring back urine cup  Follow-up in 2 weeks, sooner if concerns  Take care,  Inda Coke PA-C

## 2021-08-18 ENCOUNTER — Other Ambulatory Visit: Payer: Self-pay

## 2021-08-18 DIAGNOSIS — R112 Nausea with vomiting, unspecified: Secondary | ICD-10-CM | POA: Diagnosis not present

## 2021-08-18 NOTE — Addendum Note (Signed)
Addended by: Loura Back on: 08/18/2021 04:26 PM   Modules accepted: Orders

## 2021-08-19 LAB — URINALYSIS, ROUTINE W REFLEX MICROSCOPIC
Bacteria, UA: NONE SEEN /HPF
Bilirubin Urine: NEGATIVE
Hgb urine dipstick: NEGATIVE
Hyaline Cast: NONE SEEN /LPF
Leukocytes,Ua: NEGATIVE
Nitrite: NEGATIVE
Specific Gravity, Urine: 1.026 (ref 1.001–1.035)
Squamous Epithelial / HPF: NONE SEEN /HPF (ref <=5)
pH: 5.5 (ref 5.0–8.0)

## 2021-08-20 ENCOUNTER — Other Ambulatory Visit: Payer: Self-pay | Admitting: Internal Medicine

## 2021-08-21 ENCOUNTER — Other Ambulatory Visit: Payer: Self-pay | Admitting: Internal Medicine

## 2021-08-21 ENCOUNTER — Other Ambulatory Visit: Payer: Self-pay | Admitting: Physician Assistant

## 2021-08-21 DIAGNOSIS — R1900 Intra-abdominal and pelvic swelling, mass and lump, unspecified site: Secondary | ICD-10-CM

## 2021-08-21 MED ORDER — METOPROLOL TARTRATE 25 MG PO TABS: 25.0000 mg | ORAL_TABLET | Freq: Every day | ORAL | 2 refills | Status: DC

## 2021-08-21 NOTE — Telephone Encounter (Signed)
Pt's medication was sent to pt's pharmacy as requested. Confirmation received.  °

## 2021-08-24 ENCOUNTER — Other Ambulatory Visit: Payer: Self-pay | Admitting: Physician Assistant

## 2021-08-24 ENCOUNTER — Ambulatory Visit (HOSPITAL_BASED_OUTPATIENT_CLINIC_OR_DEPARTMENT_OTHER)
Admission: RE | Admit: 2021-08-24 | Discharge: 2021-08-24 | Disposition: A | Discharge status: Home / Self Care | Payer: Medicare HMO | Source: Ambulatory Visit | Attending: Physician Assistant | Admitting: Physician Assistant

## 2021-08-24 DIAGNOSIS — N281 Cyst of kidney, acquired: Secondary | ICD-10-CM | POA: Diagnosis not present

## 2021-08-24 DIAGNOSIS — R1900 Intra-abdominal and pelvic swelling, mass and lump, unspecified site: Secondary | ICD-10-CM | POA: Diagnosis not present

## 2021-08-24 DIAGNOSIS — Q639 Congenital malformation of kidney, unspecified: Secondary | ICD-10-CM | POA: Insufficient documentation

## 2021-08-24 DIAGNOSIS — N261 Atrophy of kidney (terminal): Secondary | ICD-10-CM | POA: Diagnosis not present

## 2021-08-24 DIAGNOSIS — I700 Atherosclerosis of aorta: Secondary | ICD-10-CM | POA: Insufficient documentation

## 2021-08-24 DIAGNOSIS — D1803 Hemangioma of intra-abdominal structures: Secondary | ICD-10-CM | POA: Diagnosis not present

## 2021-08-24 IMAGING — MR MR ABDOMEN WO/W CM
19 of 20 series · 45 of 48 positions shown · IV contrast (GADAVIST)
Comparison: No prior abdominal MRI. CT the abdomen and pelvis
[DATE].

CLINICAL DATA: 70-year-old male history of indeterminate lesion of
the left kidney. Follow-up study.

EXAM:
MRI ABDOMEN WITHOUT AND WITH CONTRAST
TECHNIQUE: Multiplanar multisequence MR imaging of the abdomen was performed
both before and after the administration of intravenous contrast.
CONTRAST:  7.5mL GADAVIST GADOBUTROL 1 MMOL/ML IV SOLN

[Series 3: cor ssfse / · coronal · 6.0mm · 1.33mm/px · 1 of 37 slices shown (1 of 2)]
[im 1/37]
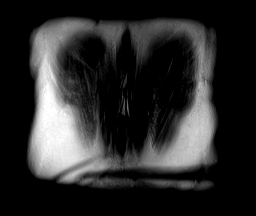

[Series 4: T2 fat-sat · axial · 7.0mm · 1.33mm/px · 1 of 30 slices shown]
[im 1/30]
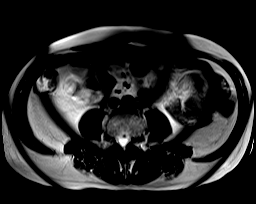

[Series 6: DWI · axial · 7.0mm · 1.88mm/px · z∈[-90,+178]mm · 3 of 99 slices shown (1 of 2)]
[im 1/99]
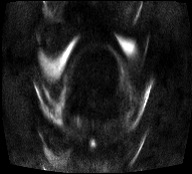
[im 50/99]
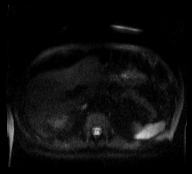
[im 99/99]
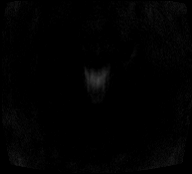

[Series 7: DWI · axial · 7.0mm · 1.88mm/px · 1 of 33 slices shown (2 of 2)]
[im 1/33]
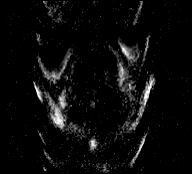

[Series 8: cor ssfse / · coronal · 6.0mm · 1.33mm/px · 1 of 38 slices shown (2 of 2)]
[im 1/38]
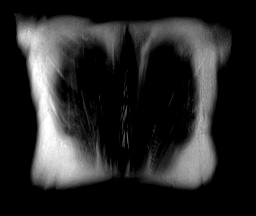

[Series 9: T1 · axial · 7.0mm · 0.66mm/px · z∈[-111,+132]mm · 3 of 60 slices shown]
[im 1/60]
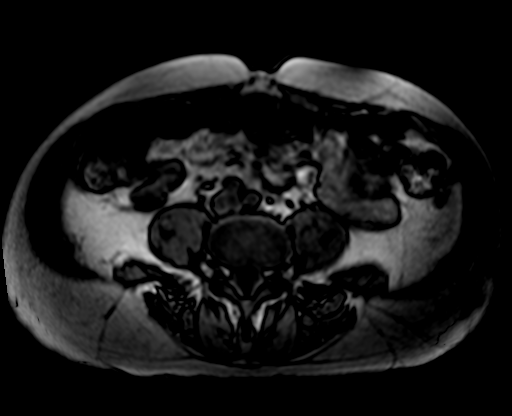
[im 30/60]
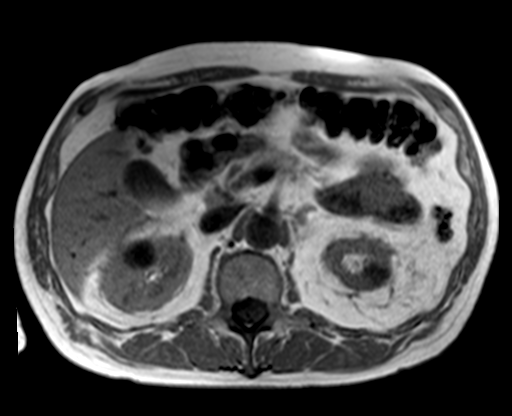
[im 60/60]
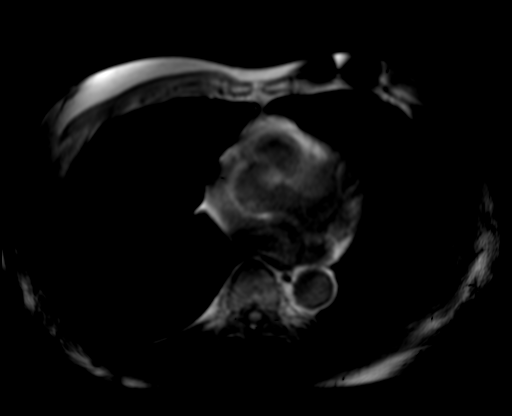

[Series 10: bSSFP · axial · 7.0mm · 0.66mm/px · 1 of 30 slices shown]
[im 1/30]
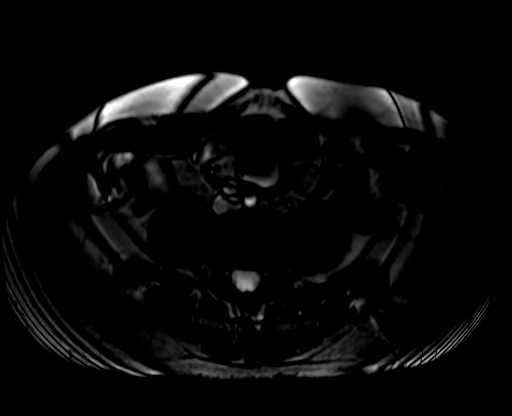

[Series 11: axial dynamic pre · axial · non-contrast · 4.0mm · 1.06mm/px · z∈[-107,+129]mm · 3 of 60 slices shown]
[im 1/60]
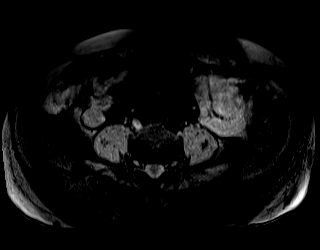
[im 30/60]
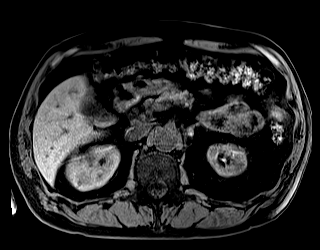
[im 60/60]
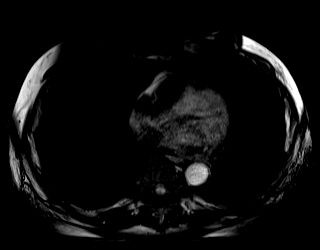

[Series 12: axial dynamic post · axial · 4.0mm · 1.06mm/px · z∈[-107,+129]mm · 3 of 60 slices shown (1 of 6)]
[im 1/60]
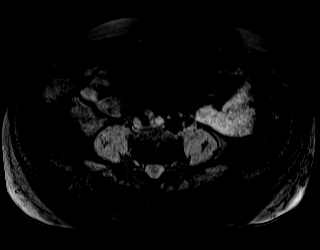
[im 30/60]
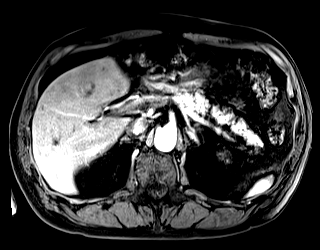
[im 60/60]
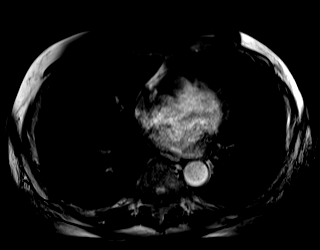

[Series 13: axial dynamic post · axial · 4.0mm · 1.06mm/px · z∈[-107,+129]mm · 3 of 60 slices shown (2 of 6)]
[im 1/60]
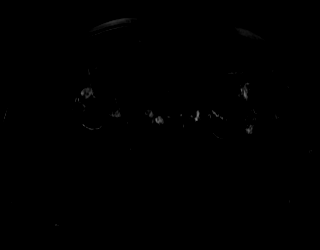
[im 30/60]
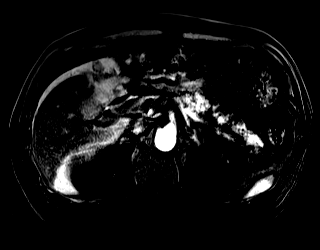
[im 60/60]
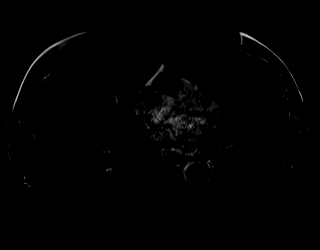

[Series 14: axial dynamic post · axial · 4.0mm · 1.06mm/px · z∈[-107,+129]mm · 3 of 60 slices shown (3 of 6)]
[im 1/60]
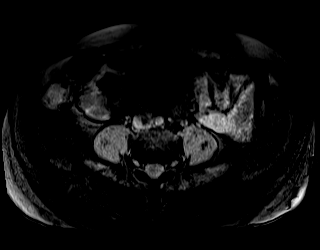
[im 30/60]
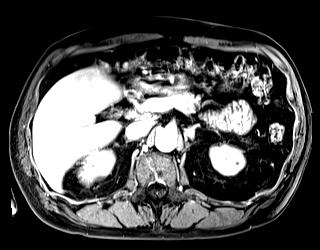
[im 60/60]
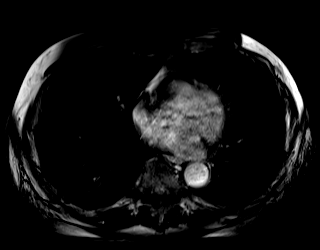

[Series 15: axial dynamic post · axial · 4.0mm · 1.06mm/px · z∈[-107,+129]mm · 3 of 60 slices shown (4 of 6)]
[im 1/60]
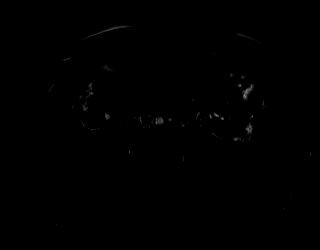
[im 30/60]
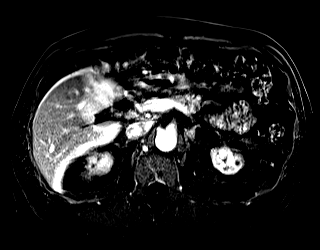
[im 60/60]
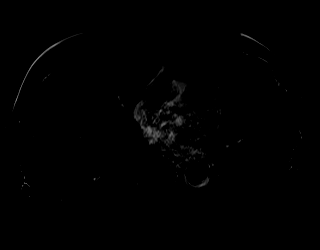

[Series 16: axial dynamic post · axial · 4.0mm · 1.06mm/px · z∈[-107,+129]mm · 3 of 60 slices shown (5 of 6)]
[im 1/60]
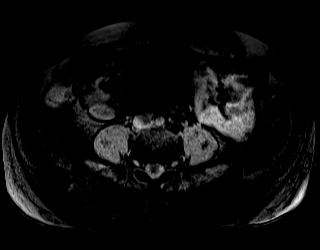
[im 30/60]
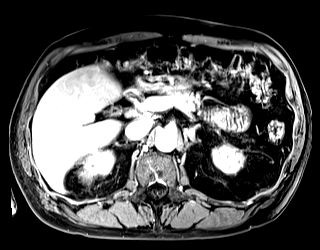
[im 60/60]
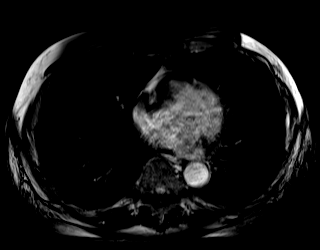

[Series 17: axial dynamic post · axial · 4.0mm · 1.06mm/px · z∈[-107,+129]mm · 3 of 60 slices shown (6 of 6)]
[im 1/60]
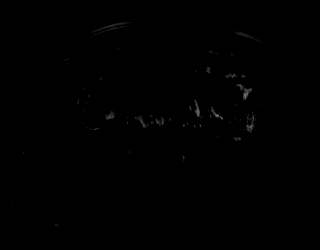
[im 30/60]
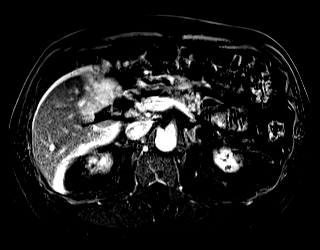
[im 60/60]
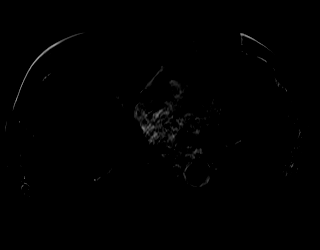

[Series 19: axial ssfse / · axial · 7.0mm · 1.12mm/px · 1 of 30 slices shown]
[im 1/30]
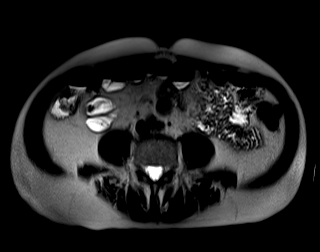

[Series 20: axial dynamic 3min · axial · 4.0mm · 1.06mm/px · z∈[-107,+129]mm · 3 of 60 slices shown (1 of 2)]
[im 1/60]
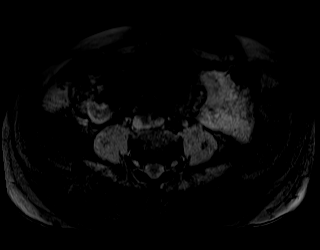
[im 30/60]
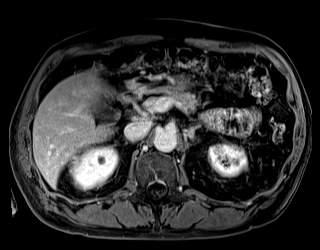
[im 60/60]
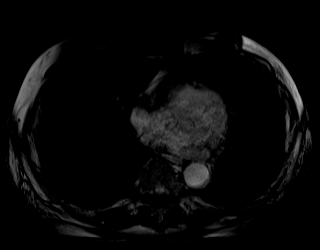

[Series 21: axial dynamic 3min · axial · 4.0mm · 1.06mm/px · z∈[-107,+129]mm · 3 of 60 slices shown (2 of 2)]
[im 1/60]
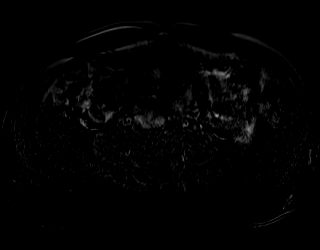
[im 30/60]
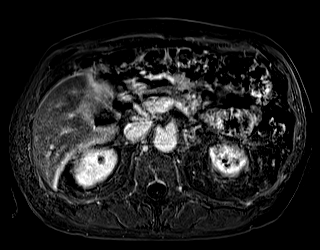
[im 60/60]
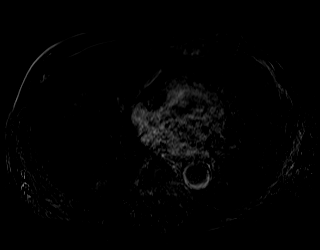

[Series 22: axial dynamic 5min · axial · 4.0mm · 1.06mm/px · z∈[-107,+129]mm · 3 of 60 slices shown (1 of 2)]
[im 1/60]
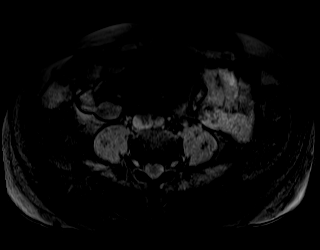
[im 30/60]
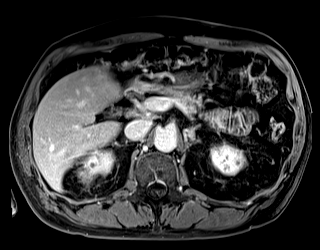
[im 60/60]
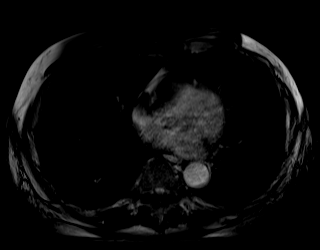

[Series 23: axial dynamic 5min · axial · 4.0mm · 1.06mm/px · z∈[-107,+129]mm · 3 of 60 slices shown (2 of 2)]
[im 1/60]
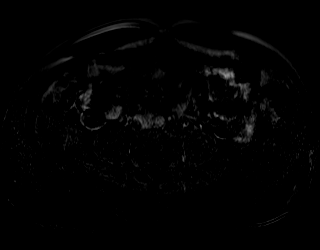
[im 30/60]
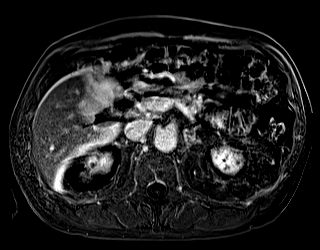
[im 60/60]
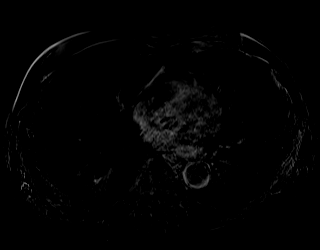

[45 of 48 positions shown; findings below may reference images not displayed]

FINDINGS: Lower chest: Unremarkable.

Hepatobiliary: Multiple T1 hypointense, T2 hyperintense lesions in
the liver which demonstrate early peripheral arterial phase nodular
hyperenhancement with progressive centripetal filling, diagnostic of
cavernous hemangiomas, largest of which is in the central aspect of
the liver near the dome between segments 4A and 8 (axial image 8 of
series 19) measuring 1.6 x 1.2 cm. No other aggressive appearing
hepatic lesions are noted. No intra or extrahepatic biliary ductal
dilatation. Gallbladder is unremarkable in appearance.

Pancreas: No pancreatic mass. No pancreatic ductal dilatation. No
pancreatic or peripancreatic fluid collections or inflammatory
changes.

Spleen:  Unremarkable.

Adrenals/Urinary Tract: Left kidney is mildly atrophic. In the
kidneys bilaterally there are multiple T1 hypointense, T2
hyperintense, nonenhancing lesions, compatible with simple (Bosniak
class 1) cysts, largest of which is in the upper pole of the right
kidney measuring 2.2 cm in diameter (axial image 18 of series 19).
The area of concern in the anterior aspect of the upper pole of the
left kidney on the prior CT examination is not well appreciated on
pre gadolinium T1 or T2 weighted images (isointense to normal renal
parenchyma). There is a subtle area of hypoperfusion on delayed post
gadolinium images best appreciated on axial image 31 of series 20
measuring approximately 1.1 x 1.4 cm. In addition, there is a
complex lesion in the upper pole of the left kidney best appreciated
on axial image 17 of series 19 which is T1 hypointense, T2
hyperintense, and has some internal septations measuring up to 2 mm
which demonstrate low-level enhancement of the septations on post
gadolinium imaging, but no mural nodularity or solid enhancing
structure (Bosniak class 2). No hydroureteronephrosis in the
visualized portions of the abdomen.

Stomach/Bowel: Visualized portions are unremarkable.

Vascular/Lymphatic: Aortic atherosclerosis, without evidence of
aneurysm in the visualized abdominal vasculature. No lymphadenopathy
noted in the abdomen.

Other: No significant volume of ascites noted in the visualized
portions of the peritoneal cavity.

Musculoskeletal: No aggressive appearing osseous lesions are noted
in the visualized portions of the skeleton.
IMPRESSION: 1. In addition to multiple Bosniak class 1 and Bosniak class 2 cysts
in the kidneys bilaterally there is an unusual hypovascular region
in the upper pole of the left kidney which has indeterminate imaging
characteristics. This could conceivably represent an area of chronic
scarring, however, neoplasm is difficult to entirely exclude, and
accordingly, short-term follow-up study with repeat abdominal MRI
with and without IV gadolinium is recommended in 6 months to ensure
stability.
2. Mild left renal atrophy.
3. Multiple cavernous hemangiomas in the liver, as above.
4. Aortic atherosclerosis.

## 2021-08-24 MED ORDER — GADOBUTROL 1 MMOL/ML IV SOLN
7.5000 mL | Freq: Once | INTRAVENOUS | Status: AC | PRN
Start: 2021-08-24 — End: 2021-08-24
  Administered 2021-08-24: 7.5 mL via INTRAVENOUS
  Filled 2021-08-24: qty 7.5

## 2021-08-25 ENCOUNTER — Ambulatory Visit (INDEPENDENT_AMBULATORY_CARE_PROVIDER_SITE_OTHER): Payer: Medicare HMO | Admitting: Acute Care

## 2021-08-25 ENCOUNTER — Other Ambulatory Visit: Payer: Self-pay | Admitting: *Deleted

## 2021-08-25 ENCOUNTER — Encounter: Payer: Self-pay | Admitting: Acute Care

## 2021-08-25 DIAGNOSIS — F17210 Nicotine dependence, cigarettes, uncomplicated: Secondary | ICD-10-CM

## 2021-08-25 DIAGNOSIS — Q639 Congenital malformation of kidney, unspecified: Secondary | ICD-10-CM

## 2021-08-25 NOTE — Progress Notes (Signed)
Virtual Visit via Telephone Note  I connected with Russell Russell on 01/24/21 at  2:00 PM EST by telephone and verified that I am speaking with the correct person using two identifiers.  Location: Patient: Home  Provider: Working from home    I discussed the limitations, risks, security and privacy concerns of performing an evaluation and management service by telephone and the availability of in person appointments. I also discussed with the patient that there may be a patient responsible charge related to this service. The patient expressed understanding and agreed to proceed.  Shared Decision Making Visit Lung Cancer Screening Program [phone])   Eligibility: Age 71 y.o. Pack Years Smoking History Calculation 33 (# packs/per year x # years smoked) Recent History of coughing up blood  no Unexplained weight loss? no ( >Than 15 pounds within the last 6 months ) Prior History Lung / other cancer no (Diagnosis within the last 5 years already requiring surveillance chest CT Scans). Smoking Status Current Smoker Former Smokers: Years since quit: NA  Quit Date: NA  Visit Components: Discussion included one or more decision making aids. yes Discussion included risk/benefits of screening. yes Discussion included potential follow up diagnostic testing for abnormal scans. yes Discussion included meaning and risk of over diagnosis. yes Discussion included meaning and risk of False Positives. yes Discussion included meaning of total radiation exposure. yes  Counseling Included: Importance of adherence to annual lung cancer LDCT screening. yes Impact of comorbidities on ability to participate in the program. yes Ability and willingness to under diagnostic treatment. yes  Smoking Cessation Counseling: Current Smokers:  Discussed importance of smoking cessation. yes Information about tobacco cessation classes and interventions provided to patient. yes Patient provided with "ticket" for  LDCT Scan. yes Symptomatic Patient. yes  Counseling(Intermediate counseling: > three minutes) [id number] Diagnosis Code: Tobacco Use Z72.0 Asymptomatic Patient no  Counseling NA Former Smokers:  Discussed the importance of maintaining cigarette abstinence. yes Diagnosis Code: Personal History of Nicotine Dependence. I34.742 Information about tobacco cessation classes and interventions provided to patient. Yes Patient provided with "ticket" for LDCT Scan. yes Written Order for Lung Cancer Screening with LDCT placed in Epic. Yes (CT Chest Lung Cancer Screening Low Dose W/O CM) [id number] Z12.2-Screening of respiratory organs Z87.891-Personal history of nicotine dependence   I spent 25 minutes of face to face time with him discussing the risks and benefits of lung cancer screening. We viewed a power point together that explained in detail the above noted topics. We took the time to pause the power point at intervals to allow for questions to be asked and answered to ensure understanding. We discussed that he had taken the single most powerful action possible to decrease his risk of developing lung cancer when he quit smoking. I counseled him to remain smoke free, and to contact me if he ever had the desire to smoke again so that I can provide resources and tools to help support the effort to remain smoke free. We discussed the time and location of the scan, and that either  Doroteo Glassman RN or I will call with the results within  24-48 hours of receiving them. He has my card and contact information in the event he needs to speak with me, in addition to a copy of the power point we reviewed as a resource. He verbalized understanding of all of the above and had no further questions upon leaving the office.     I explained to the patient that there has been  a high incidence of coronary artery disease noted on these exams. I explained that this is a non-gated exam therefore degree or severity cannot be  determined. This patient is on statin therapy. I have asked the patient to follow-up with their PCP regarding any incidental finding of coronary artery disease and management with diet or medication as they feel is clinically indicated. The patient verbalized understanding of the above and had no further questions.   I spent 3 minutes counseling on smoking cessation and the health risks of continued tobacco abuse    Russell Russell D. Kenton Kingfisher, NP-C [facility] Personal contact information can be found on Amion  08/25/2021, 10:39 AM

## 2021-08-25 NOTE — Patient Instructions (Signed)

## 2021-08-28 ENCOUNTER — Ambulatory Visit (INDEPENDENT_AMBULATORY_CARE_PROVIDER_SITE_OTHER): Payer: Medicare HMO

## 2021-08-28 DIAGNOSIS — I6340 Cerebral infarction due to embolism of unspecified cerebral artery: Secondary | ICD-10-CM

## 2021-08-29 ENCOUNTER — Ambulatory Visit (INDEPENDENT_AMBULATORY_CARE_PROVIDER_SITE_OTHER)
Admission: RE | Admit: 2021-08-29 | Discharge: 2021-08-29 | Disposition: A | Discharge status: Home / Self Care | Payer: Medicare HMO | Source: Ambulatory Visit | Attending: Acute Care | Admitting: Acute Care

## 2021-08-29 DIAGNOSIS — F17210 Nicotine dependence, cigarettes, uncomplicated: Secondary | ICD-10-CM | POA: Diagnosis not present

## 2021-08-29 DIAGNOSIS — Z122 Encounter for screening for malignant neoplasm of respiratory organs: Secondary | ICD-10-CM

## 2021-08-29 DIAGNOSIS — Z87891 Personal history of nicotine dependence: Secondary | ICD-10-CM

## 2021-08-29 LAB — CUP PACEART REMOTE DEVICE CHECK
Date Time Interrogation Session: 20230612231017
Implantable Pulse Generator Implant Date: 20210423

## 2021-08-29 IMAGING — CT CT CHEST LUNG CANCER SCREENING LOW DOSE W/O CM
2 of 5 series · 15 of 40 positions shown, 18 images · non-contrast
Comparison: [DATE] chest CT.

CLINICAL DATA: 70-year-old asymptomatic male current smoker with 33
pack-year smoking history.



[Series 3: lung thins 1.0 · axial · 0.69mm/px · z∈[-44,+267]mm · 12 of 343 slices shown, 15 images]
[im 16/343  mediastinal]
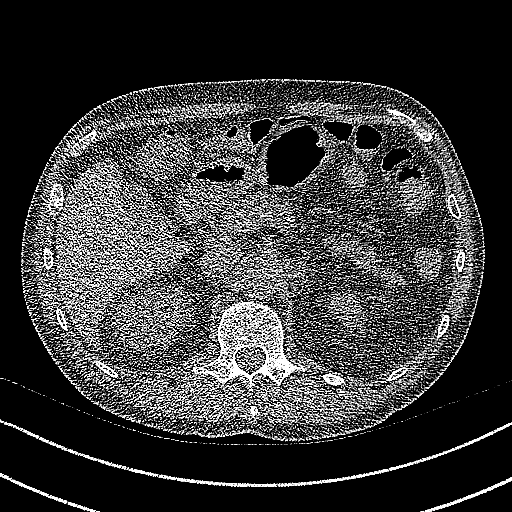
[im 16/343  lung]
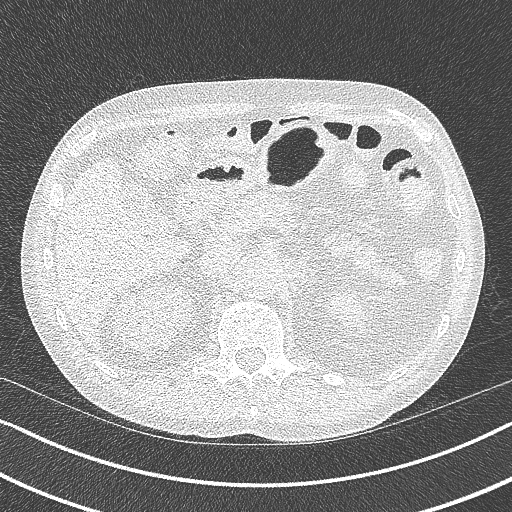
[im 47/343  lung]
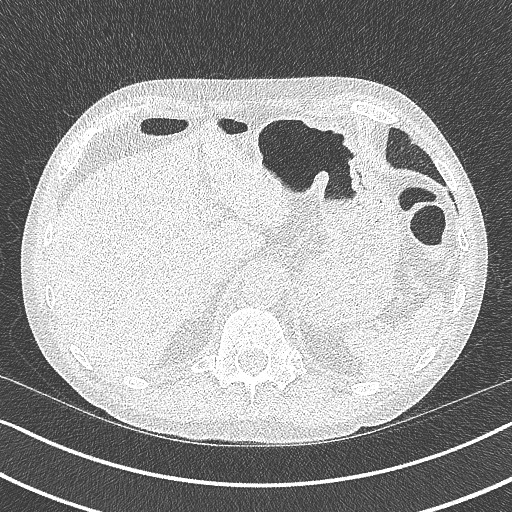
[im 78/343  lung]
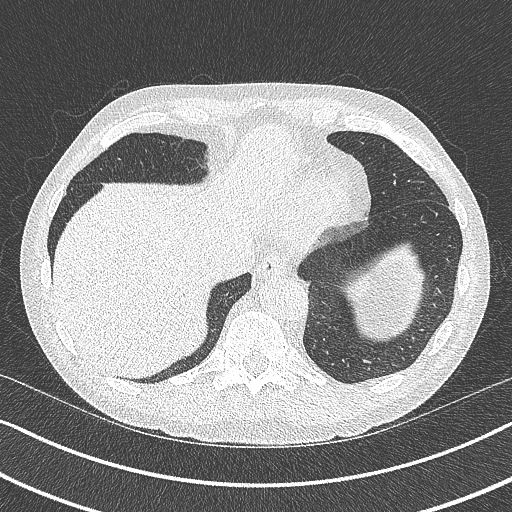
[im 109/343  lung]
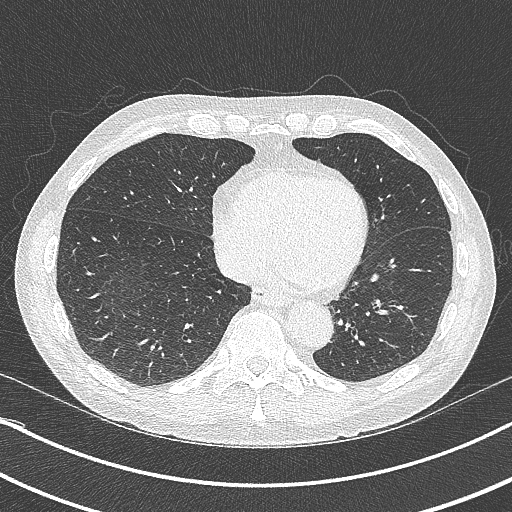
[im 125/343  mediastinal]
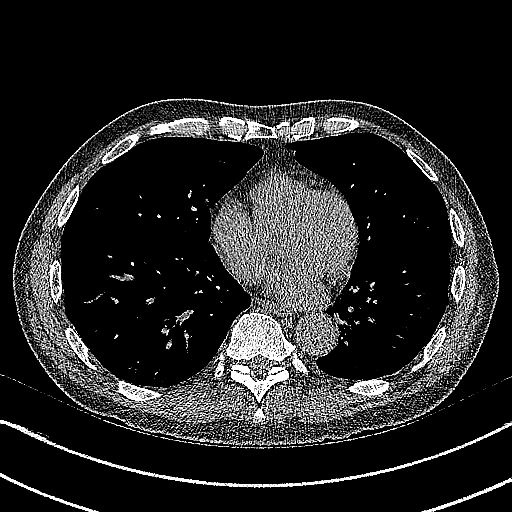
[im 125/343  lung]
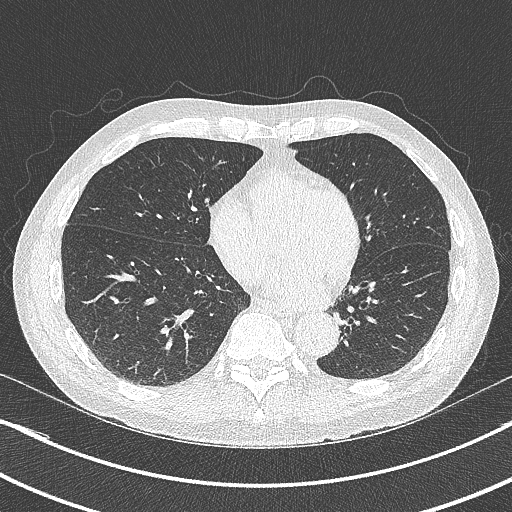
[im 156/343  lung]
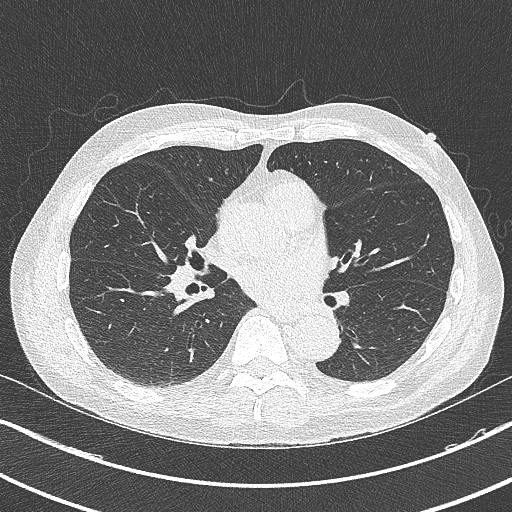
[im 187/343  lung]
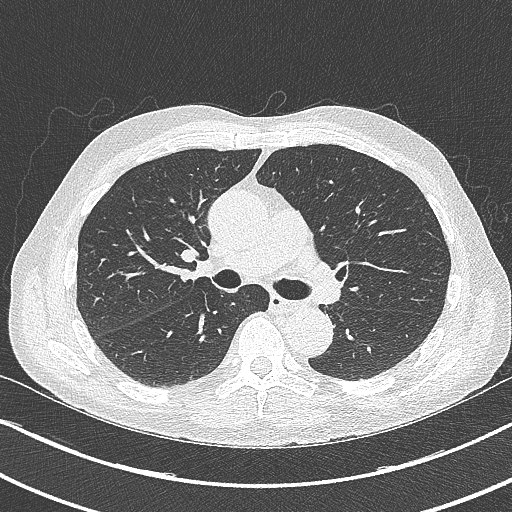
[im 218/343  lung]
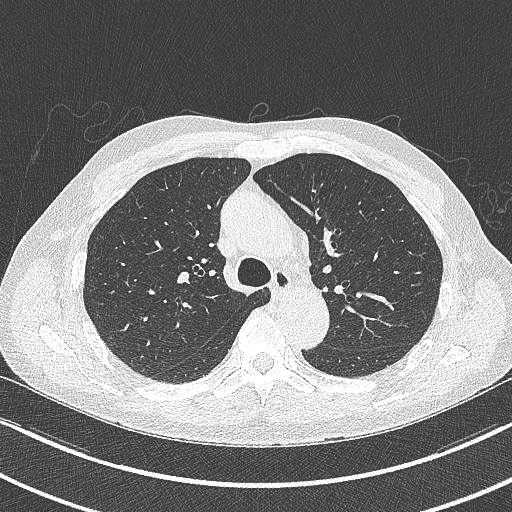
[im 234/343  mediastinal]
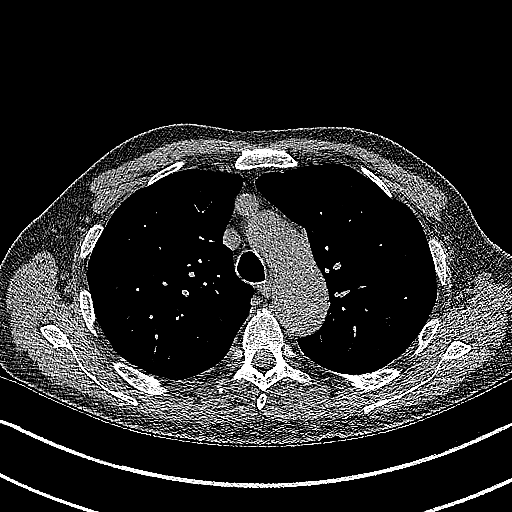
[im 234/343  lung]
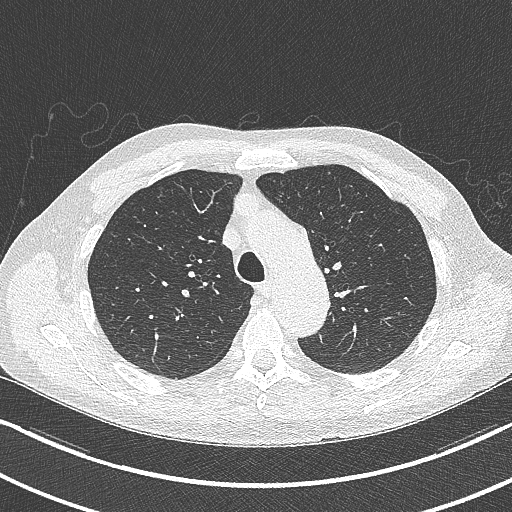
[im 265/343  lung]
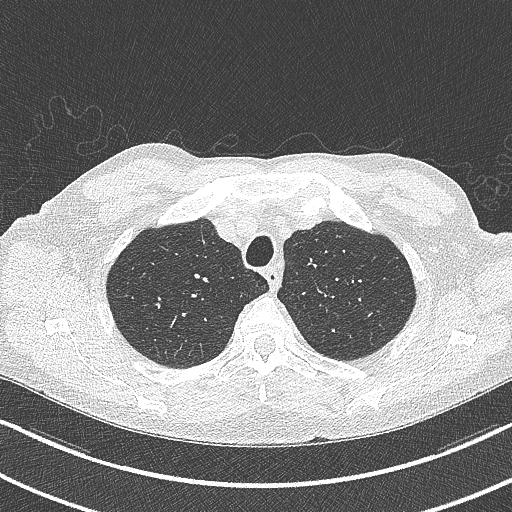
[im 296/343  lung]
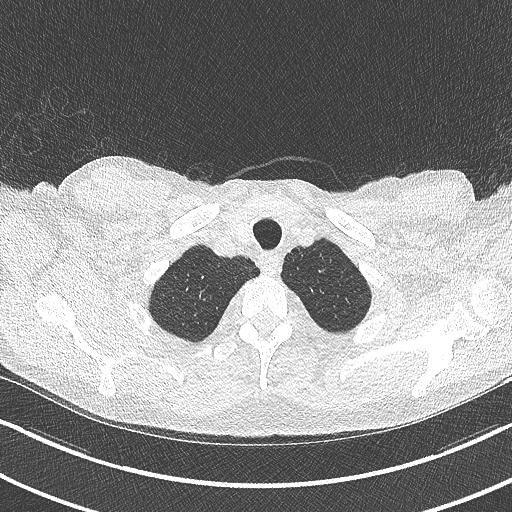
[im 327/343  lung]
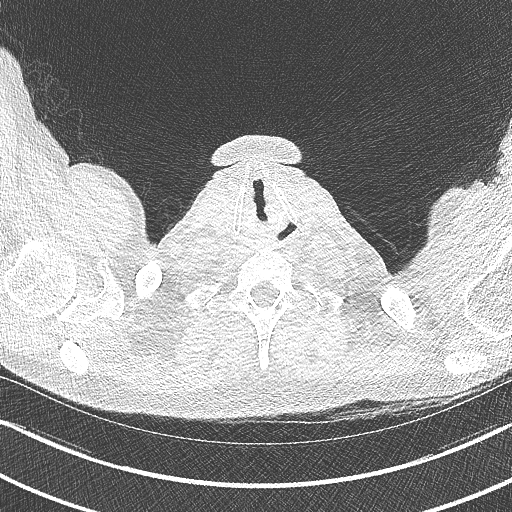

[Series 5: coronal · coronal · 0.64mm/px · 3 of 108 slices shown]
[im 22/108  lung]
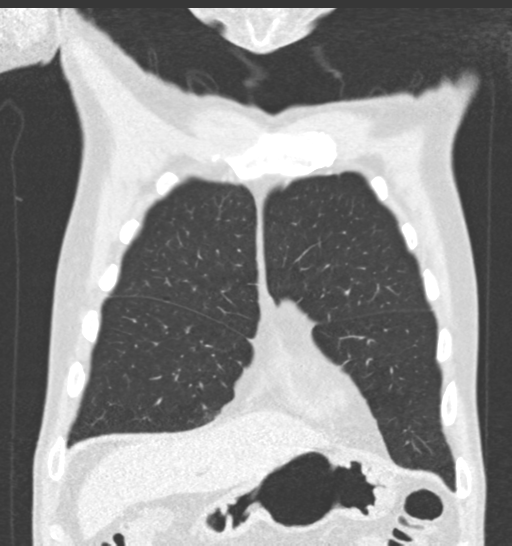
[im 43/108  lung]
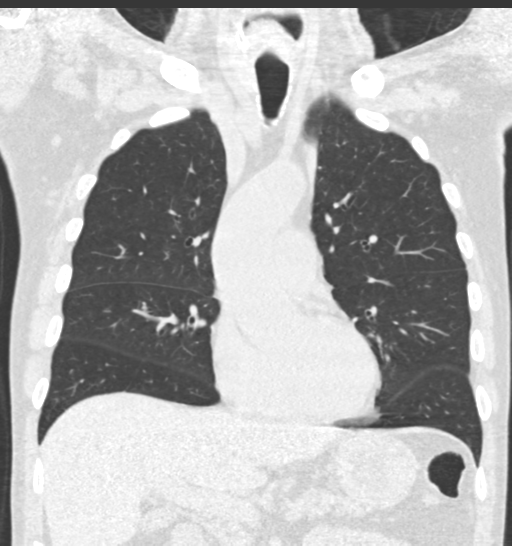
[im 65/108  lung]
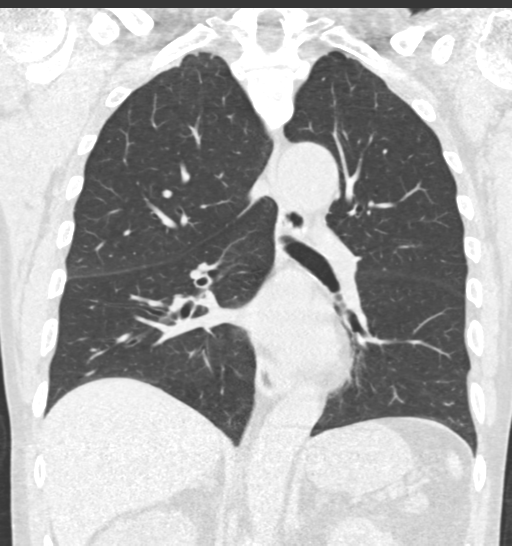

[15 of 40 positions shown; findings below may reference images not displayed]

FINDINGS: Cardiovascular: Normal heart size. No significant pericardial
effusion/thickening. Left anterior descending coronary
atherosclerosis. Atherosclerotic nonaneurysmal thoracic aorta.
Normal caliber pulmonary arteries.

Mediastinum/Nodes: No discrete thyroid nodules. Unremarkable
esophagus. No pathologically enlarged axillary, mediastinal or hilar
lymph nodes, noting limited sensitivity for the detection of hilar
adenopathy on this noncontrast study.

Lungs/Pleura: No pneumothorax. No pleural effusion. Mild
centrilobular emphysema with diffuse bronchial wall thickening. No
acute consolidative airspace disease or lung masses. Several
scattered solid pulmonary nodules in both lungs, largest 4.5 mm in
volume derived mean diameter in the posterior left upper lobe
(series 3/image 101).

Upper abdomen: Hypodense 1.2 cm and 1.1 cm anterior liver dome
lesions are unchanged from [DATE], where they were seen to
represent hemangiomas. Partially visualized simple 2.2 cm upper
right renal cyst, for which no imaging follow-up is recommended.

Musculoskeletal: No aggressive appearing focal osseous lesions. Mild
thoracic spondylosis. Subcutaneous loop recorder in the ventral left
chest wall.
IMPRESSION: 1. Lung-RADS 2, benign appearance or behavior. Continue annual
screening with low-dose chest CT without contrast in 12 months.
2. One vessel coronary atherosclerosis.
3. Aortic Atherosclerosis ([59]-[59]) and Emphysema ([59]-[59]).

## 2021-08-30 ENCOUNTER — Other Ambulatory Visit: Payer: Self-pay | Admitting: Acute Care

## 2021-08-30 DIAGNOSIS — F17210 Nicotine dependence, cigarettes, uncomplicated: Secondary | ICD-10-CM

## 2021-08-30 DIAGNOSIS — Z87891 Personal history of nicotine dependence: Secondary | ICD-10-CM

## 2021-08-30 DIAGNOSIS — Z122 Encounter for screening for malignant neoplasm of respiratory organs: Secondary | ICD-10-CM

## 2021-08-31 ENCOUNTER — Encounter: Payer: Self-pay | Admitting: Physician Assistant

## 2021-08-31 ENCOUNTER — Ambulatory Visit (INDEPENDENT_AMBULATORY_CARE_PROVIDER_SITE_OTHER): Payer: Medicare HMO | Admitting: Physician Assistant

## 2021-08-31 VITALS — BP 120/80 | HR 74 | Temp 97.3°F | Ht 70.0 in | Wt 166.0 lb

## 2021-08-31 DIAGNOSIS — I10 Essential (primary) hypertension: Secondary | ICD-10-CM | POA: Diagnosis not present

## 2021-08-31 DIAGNOSIS — F17210 Nicotine dependence, cigarettes, uncomplicated: Secondary | ICD-10-CM | POA: Diagnosis not present

## 2021-08-31 DIAGNOSIS — R81 Glycosuria: Secondary | ICD-10-CM | POA: Diagnosis not present

## 2021-08-31 DIAGNOSIS — Q639 Congenital malformation of kidney, unspecified: Secondary | ICD-10-CM | POA: Diagnosis not present

## 2021-08-31 DIAGNOSIS — R6889 Other general symptoms and signs: Secondary | ICD-10-CM

## 2021-08-31 DIAGNOSIS — R112 Nausea with vomiting, unspecified: Secondary | ICD-10-CM | POA: Diagnosis not present

## 2021-08-31 DIAGNOSIS — Z720 Tobacco use: Secondary | ICD-10-CM

## 2021-08-31 LAB — URINALYSIS, ROUTINE W REFLEX MICROSCOPIC
Bilirubin Urine: NEGATIVE
Ketones, ur: NEGATIVE
Leukocytes,Ua: NEGATIVE
Nitrite: NEGATIVE
Specific Gravity, Urine: <=1.005 — AB (ref 1.000–1.030)
Total Protein, Urine: NEGATIVE
Urine Glucose: NEGATIVE
Urobilinogen, UA: 0.2 (ref 0.0–1.0)
pH: 6 (ref 5.0–8.0)

## 2021-08-31 MED ORDER — NICOTINE POLACRILEX 2 MG MT GUM: 2.0000 mg | CHEWING_GUM | OROMUCOSAL | 0 refills | Status: DC | PRN

## 2021-08-31 NOTE — Progress Notes (Signed)
Russell Russell is a 71 y.o. male here for a follow up of unintentional weight loss.   History of Present Illness:   Chief Complaint  Patient presents with   Weight Check    He is down 8 ounces.    HPI  Glucosuria Finding on last UA. Will repeat today. Denies increase in thirst or urination. Last A1c was 6.2%.  Nausea; Loss of appetite  In our previous visit, pt has had issue with nausea and decreased appetite. Today he notes this has resolved. He has not noticed any unintentional weight loss. He has had MRI done for further evaluation which showed multiple cyst in kidney. He was also found to have some "unusual area" which could not be determine. He would like to be referred to Nephrologist for further evaluation for this.   HTN Patient is currently taking lopressor 25 mg daily. He is tolerating his medication well. No reported side effects. At home blood pressure readings are WNL. Patient denies chest pain, SOB, blurred vision, dizziness, unusual headaches, lower leg swelling. Patient is compliant with medication. Denies excessive caffeine intake, stimulant usage, excessive. No excessive  alcohol intake, or increase in salt consumption.  BP Readings from Last 3 Encounters:  08/31/21 120/80  08/17/21 110/70  07/27/21 128/84   Tobacco use  Patient recently have lung screening which showed some benign lymph nodes and emphysema. He was asked to recheck lung screening in 1 year. He does admit he has tried quitting this in the past. He has tried patch at that time but he has developed some rash. Continues to smoke 1/2 or 1 PPD a day. He has tried chantix in the past but had awful dreams with this and does not want to trial again.  Renal structural abnormality We are still awaiting his appointment with nephrology due to MR finding -- 1. In addition to multiple Bosniak class 1 and Bosniak class 2 cysts in the kidneys bilaterally there is an unusual hypovascular region in the upper pole of  the left kidney which has indeterminate imaging characteristics. This could conceivably represent an area of chronic scarring, however, neoplasm is difficult to entirely exclude, and accordingly, short-term follow-up study with repeat abdominal MRI with and without IV gadolinium is recommended in 6 months to ensure stability.  Past Medical History:  Diagnosis Date   Alcohol abuse    drinks 1 gallon of Gin every weekend, nothing during the week x >15 yrs   Elevated transaminase level    +elev bili: abd u/s 06/2014 showed stable small hepatic hemangiomas, o/w normal.   Essential hypertension    GERD (gastroesophageal reflux disease)    Hyperlipidemia 03/2014   Atorv started-chol improved   Impaired fasting glucose 03/2014   Nephrolithiasis    PUD (peptic ulcer disease)    Stroke ([place])    Tobacco dependence    chantix: "psych effects"     Social History   Tobacco Use   Smoking status: Every Day    Packs/day: 0.50    Years: 40.00    Total pack years: 20.00    Types: Cigarettes    Start date: 31   Smokeless tobacco: Never  Vaping Use   Vaping Use: Never used  Substance Use Topics   Alcohol use: Yes    Alcohol/week: 8.0 standard drinks of alcohol    Types: 8 Cans of beer per week    Comment: Weekends only   Drug use: No    Past Surgical History:  Procedure Laterality Date  COLONOSCOPY  2005   Recall 10 yrs ([place])   LOOP RECORDER INSERTION N/A 07/03/2019   Procedure: LOOP RECORDER INSERTION;  Surgeon: Thompson Grayer, MD;  Location: [facility];  Service: Cardiovascular;  Laterality: N/A;   removal of kidney stones  2006   cystoscopic--removed from ureter.  No prob since.    Family History  Problem Relation Age of Onset   Colon cancer Mother    Cancer Father    Breast cancer Sister    Breast cancer Sister     Allergies  Allergen Reactions   Penicillins Rash    Current Medications:   Current Outpatient Medications:    acetaminophen (TYLENOL)  325 MG tablet, Take 2 tablets (650 mg total) by mouth every 4 (four) hours as needed for mild pain (or temp > 37.5 C (99.5 F))., Disp:  , Rfl:    atorvastatin (LIPITOR) 40 MG tablet, TAKE 1 TABLET BY MOUTH EVERY DAY, Disp: 90 tablet, Rfl: 1   folic acid (FOLVITE) 1 MG tablet, TAKE 1 TABLET BY MOUTH EVERY DAY, Disp: 90 tablet, Rfl: 1   halobetasol (ULTRAVATE) 0.05 % cream, Apply 1 application topically as needed (skin irritation)., Disp: , Rfl:    metoprolol tartrate (LOPRESSOR) 25 MG tablet, Take 1 tablet (25 mg total) by mouth daily., Disp: 90 tablet, Rfl: 2   Multiple Vitamin (MULTIVITAMIN WITH MINERALS) TABS tablet, Take 1 tablet by mouth daily., Disp:  , Rfl:    nicotine polacrilex (NICORETTE) 2 MG gum, Take 1 each (2 mg total) by mouth as needed for smoking cessation., Disp: 100 tablet, Rfl: 0   omeprazole (PRILOSEC) 20 MG capsule, Take 20 mg by mouth as needed (acid reflux)., Disp: , Rfl:    rivaroxaban (XARELTO) 20 MG TABS tablet, TAKE 1 TABLET EVERY DAY WITH SUPPER., Disp: 90 tablet, Rfl: 0   Review of Systems:   ROS Negative unless otherwise specified per HPI.   Vitals:   Vitals:   08/31/21 1025 08/31/21 1105  BP: 120/90 120/80  Pulse: 70 74  Temp: (!) 97.3 F (36.3 C)   TempSrc: Temporal   SpO2: 98%   Weight: 166 lb (75.3 kg)   Height: '5\' 10"'$  (1.778 m)      Body mass index is 23.82 kg/m.  Physical Exam:   Physical Exam Vitals and nursing note reviewed.  Constitutional:      General: He is not in acute distress.    Appearance: He is well-developed. He is not ill-appearing or toxic-appearing.  Cardiovascular:     Rate and Rhythm: Normal rate and regular rhythm.     Pulses: Normal pulses.     Heart sounds: Normal heart sounds, S1 normal and S2 normal.  Pulmonary:     Effort: Pulmonary effort is normal.     Breath sounds: Normal breath sounds.  Skin:    General: Skin is warm and dry.  Neurological:     Mental Status: He is alert.     GCS: GCS eye subscore is 4.  GCS verbal subscore is 5. GCS motor subscore is 6.  Psychiatric:        Speech: Speech normal.        Behavior: Behavior normal. Behavior is cooperative.     Assessment and Plan:   Renal structural abnormality Recommend close follow-up with nephrology Follow-up with [place] as needed  Glucosuria No sx Update UA today for further evaluation and management  Essential hypertension Normotensive Continue lopressor 25 mg daily Follow-up in 6 months, sooner  if concerns  Unintentional weight change Weight is stable Continue regular nutrition as able Follow-up in 6 months, sooner if concerns  Nausea and vomiting, unspecified vomiting type Resolved  Tobacco abuse He is in action stage! He would not like to trial chantix due to side effects Is agreeable for NRT -- I have sent in gum to see if this is covered  He cannot tolerate patch due to skin sensitivity Follow-up in 6 months, sooner if concerns  I spent 3 minutes counseling on smoking cessation   I,Savera Zaman,acting as a scribe for [employer], PA.,have documented all relevant documentation on the behalf of Inda Coke, PA,as directed by  Inda Coke, PA while in the presence of Inda Coke, [place].   I, Inda Coke, [place], have reviewed all documentation for this visit. The documentation on 08/31/21 for the exam, diagnosis, procedures, and orders are all accurate and complete.   Inda Coke, PA-C

## 2021-08-31 NOTE — Patient Instructions (Signed)
It was great to see you!  Per our referral coordinator -- they are still working on your urgent referral. Please let me know if you do not hear anything in the next week or so.  We will recheck your urine today.  I have sent in nicotine replacement gum-- if this is unaffordable consider looking online or calling your insurance to see if gums,  lozenges or others are covered  Let's follow-up in 6 months, sooner if you have concerns.  Take care,  Inda Coke PA-C

## 2021-09-06 ENCOUNTER — Encounter: Payer: Self-pay | Admitting: Physician Assistant

## 2021-09-11 ENCOUNTER — Encounter: Payer: Self-pay | Admitting: Physician Assistant

## 2021-09-11 ENCOUNTER — Other Ambulatory Visit: Payer: Self-pay

## 2021-09-11 DIAGNOSIS — R638 Other symptoms and signs concerning food and fluid intake: Secondary | ICD-10-CM

## 2021-09-11 DIAGNOSIS — R112 Nausea with vomiting, unspecified: Secondary | ICD-10-CM

## 2021-09-11 DIAGNOSIS — R110 Nausea: Secondary | ICD-10-CM

## 2021-09-11 MED ORDER — ONDANSETRON HCL 4 MG PO TABS: 4.0000 mg | ORAL_TABLET | Freq: Three times a day (TID) | ORAL | 1 refills | Status: DC | PRN

## 2021-09-11 MED ORDER — MIRTAZAPINE 7.5 MG PO TABS: 7.5000 mg | ORAL_TABLET | Freq: Every day | ORAL | 1 refills | Status: DC

## 2021-09-11 NOTE — Telephone Encounter (Signed)
This message was already resloved.

## 2021-09-11 NOTE — Telephone Encounter (Signed)
I called and spoke with patient to let her know medication was sent and also a referral

## 2021-09-19 ENCOUNTER — Encounter: Payer: Self-pay | Admitting: Gastroenterology

## 2021-09-19 NOTE — Progress Notes (Signed)
[facility]link Summary Report / Loop Recorder 

## 2021-09-22 ENCOUNTER — Other Ambulatory Visit: Payer: Self-pay | Admitting: Physician Assistant

## 2021-09-25 ENCOUNTER — Other Ambulatory Visit: Payer: Self-pay | Admitting: Physician Assistant

## 2021-09-25 DIAGNOSIS — R638 Other symptoms and signs concerning food and fluid intake: Secondary | ICD-10-CM

## 2021-10-02 ENCOUNTER — Ambulatory Visit (INDEPENDENT_AMBULATORY_CARE_PROVIDER_SITE_OTHER): Payer: Medicare HMO

## 2021-10-02 DIAGNOSIS — I6340 Cerebral infarction due to embolism of unspecified cerebral artery: Secondary | ICD-10-CM

## 2021-10-02 LAB — CUP PACEART REMOTE DEVICE CHECK
Date Time Interrogation Session: 20230715230925
Implantable Pulse Generator Implant Date: 20210423

## 2021-10-18 ENCOUNTER — Ambulatory Visit: Payer: Medicare HMO | Admitting: Gastroenterology

## 2021-10-18 ENCOUNTER — Telehealth: Payer: Self-pay | Admitting: Internal Medicine

## 2021-10-18 ENCOUNTER — Encounter: Payer: Self-pay | Admitting: Gastroenterology

## 2021-10-18 ENCOUNTER — Telehealth: Payer: Self-pay

## 2021-10-18 VITALS — BP 122/74 | HR 77 | Ht 70.0 in | Wt 155.8 lb

## 2021-10-18 DIAGNOSIS — R634 Abnormal weight loss: Secondary | ICD-10-CM | POA: Diagnosis not present

## 2021-10-18 DIAGNOSIS — F5000 Anorexia nervosa, unspecified: Secondary | ICD-10-CM

## 2021-10-18 DIAGNOSIS — R6881 Early satiety: Secondary | ICD-10-CM | POA: Diagnosis not present

## 2021-10-18 DIAGNOSIS — I4811 Longstanding persistent atrial fibrillation: Secondary | ICD-10-CM

## 2021-10-18 DIAGNOSIS — Z7901 Long term (current) use of anticoagulants: Secondary | ICD-10-CM | POA: Diagnosis not present

## 2021-10-18 DIAGNOSIS — D1803 Hemangioma of intra-abdominal structures: Secondary | ICD-10-CM | POA: Diagnosis not present

## 2021-10-18 DIAGNOSIS — R112 Nausea with vomiting, unspecified: Secondary | ICD-10-CM | POA: Diagnosis not present

## 2021-10-18 DIAGNOSIS — R630 Anorexia: Secondary | ICD-10-CM | POA: Diagnosis not present

## 2021-10-18 MED ORDER — RIVAROXABAN 20 MG PO TABS: ORAL_TABLET | ORAL | 1 refills | Status: DC

## 2021-10-18 NOTE — Telephone Encounter (Signed)
Primary Cardiologist:None   Preoperative team, please contact this patient and set up a phone call appointment for further preoperative risk assessment. Please obtain consent and complete medication review. Thank you for your help.   I confirm that guidance regarding antiplatelet and oral anticoagulation therapy has been completed and, if necessary, noted below.   Emmaline Life, NP-C    10/18/2021, 5:08 PM [place] [address] Office [phone] Fax [phone]

## 2021-10-18 NOTE — Telephone Encounter (Signed)
[facility] Pre-operative Risk Assessment     Request for surgical clearance:     Endoscopy Procedure  What type of surgery is being performed?     endoscopy  When is this surgery scheduled?     10/27/21  What type of clearance is required ?   Medical, and Pharmacy  Are there any medications that need to be held prior to surgery and how long? Xarelto for 2 days  Practice name and name of physician performing surgery?   [facility], Dr. Bryan Lemma  What is your office phone and fax number?      Phone- [phone]  [phone]  Anesthesia type (None, local, MAC, general) ?       MAC

## 2021-10-18 NOTE — Telephone Encounter (Signed)
Refill has been sent in. Mail order should arrive within a week in which case pt should not need samples. If pt has not received refill by then and has run out of Xarelto, he should call clinic and can be provided with a week's worth of samples at that time, but should not require samples for 2 weeks currently.

## 2021-10-18 NOTE — Patient Instructions (Addendum)
_______________________________________________________  If you are age 70 or older, your body mass index should be between 23-30. Your Body mass index is 22.35 kg/m. If this is out of the aforementioned range listed, please consider follow up with your Primary Care Provider  ________________________________________________________  The [facility] providers would like to encourage you to use [facility] to communicate with providers for non-urgent requests or questions.  Due to long hold times on the telephone, sending your provider a message by [facility] may be a faster and more efficient way to get a response.  Please allow 48 business hours for a response.  Please remember that this is for non-urgent requests.  _______________________________________________________  Due to recent changes in healthcare laws, you may see the results of your imaging and laboratory studies on MyChart before your provider has had a chance to review them.  We understand that in some cases there may be results that are confusing or concerning to you. Not all laboratory results come back in the same time frame and the provider may be waiting for multiple results in order to interpret others.  Please give [place] 48 hours in order for your provider to thoroughly review all the results before contacting the office for clarification of your results.   You will be contaced by our office prior to your procedure for directions on holding your Xarelto.  If you do not hear from our office 1 week prior to your scheduled procedure, please call [phone] to discuss.   Thank you for choosing me and [facility].  Vito Cirigliano, D.O.

## 2021-10-18 NOTE — Telephone Encounter (Signed)
Patient with diagnosis of afib on Xarelto for anticoagulation.    Procedure: endoscopy Date of procedure: 10/27/21   CHA2DS2-VASc Score = 4   This indicates a 4.8% annual risk of stroke. The patient's score is based upon: CHF History: 0 HTN History: 1 Diabetes History: 0 Stroke History: 2 Vascular Disease History: 0 Age Score: 1 Gender Score: 0      CrCl 61 ml/min  Due to pt hx of stroke and bleed risk of procedure, it would be ideal if patient held Xarelto for only 1 day prior to procedure. If 2 days is needed per GI then approval from MD will needed  Patient should resume anticoagulation as soon as possible post procedure.  **This guidance is not considered finalized until pre-operative APP has relayed final recommendations.**

## 2021-10-18 NOTE — Telephone Encounter (Signed)
Yes, I am perfectly comfortable with a 1 day hold only of the Xarelto.  Thank you.

## 2021-10-18 NOTE — Progress Notes (Unsigned)
Chief Complaint: Nausea/vomiting, anorexia, weight loss   Referring Provider:     Inda Coke, PA    HPI:     Russell Russell is a 71 y.o. male with a history of A-fib (on Xarelto), HTN, HLD, nephrolithiasis, PUD, CVA 06/2019, loop recorder, tobacco use, EtOH use, referred to the [facility]astroenterology Clinic for evaluation of nausea/vomiting, loss of appetite, weight loss  Sxs started ~4-5 months ago, but n/v has since improved. Now main issue is loss of appetite, early satiety, and wt loss over last few months. Baseline wt is mod 170's. Was 166 on 6/22 and 155# today. No hematochezia, melena. No abdominal pain, f/c, night sweats.   -08/17/2021: CT A/P: Normal-appearing GI tract.  Indeterminate structure of anterior left kidney unchanged from 2021.  Recommend MRI.  Prostate enlargement, bilateral renal cysts.  Stable indeterminate lesions at left hepatic dome measuring up to 1.3 cm, stable from 2015. - 08/24/2021: MRI abdomen: Multiple benign cavernous hepatic hemangiomas.  Multiple Bosniak class I/class II kidney cysts.  Recommend repeat MRI 6 months  Has initial appt with Nephrology next month.   No prior EGD, or colonoscopy. Cologuard negative 12/2020.   Reviewed recent labs from 08/2021 to include normal H. pylori IgG, lipase, CMP (AST 45), CBC     Latest Ref Rng & Units 08/17/2021   10:43 AM 07/27/2021    8:58 AM 04/24/2021   10:32 AM  CMP  Glucose 70 - 99 mg/dL 104  120  276   BUN 6 - 23 mg/dL '8  12  14   '$ Creatinine 0.40 - 1.50 mg/dL 1.13  1.20  1.18   Sodium 135 - 145 mEq/L 140  139  142   Potassium 3.5 - 5.1 mEq/L 4.5  4.7  4.7   Chloride 96 - 112 mEq/L 106  107  107   CO2 19 - 32 mEq/L '28  27  27   '$ Calcium 8.4 - 10.5 mg/dL 9.7  9.8  10.0   Total Protein 6.0 - 8.3 g/dL 7.2  7.7  7.2   Total Bilirubin 0.2 - 1.2 mg/dL 0.8  0.9  0.8   Alkaline Phos 39 - 117 U/L 112  109  128   AST 0 - 37 U/L 45  57  40   ALT 0 - 53 U/L 52  50  42        Latest Ref Rng & Units  08/17/2021   10:43 AM 04/24/2021   10:32 AM 04/10/2021   12:47 PM  CBC  WBC 4.0 - 10.5 K/uL 5.2  4.9  6.4   Hemoglobin 13.0 - 17.0 g/dL 14.7  14.4  15.5   Hematocrit 39.0 - 52.0 % 44.1  44.5  45.9   Platelets 150.0 - 400.0 K/uL 240.0  241.0  275      Past Medical History:  Diagnosis Date   Alcohol abuse    drinks 1 gallon of Gin every weekend, nothing during the week x >15 yrs   Atrial fibrillation ([facility])    Elevated transaminase level    +elev bili: abd u/s 06/2014 showed stable small hepatic hemangiomas, o/w normal.   Essential hypertension    GERD (gastroesophageal reflux disease)    Hyperlipidemia 03/2014   Atorv started-chol improved   Hypertension    Impaired fasting glucose 03/2014   Nephrolithiasis    PUD (peptic ulcer disease)    Stroke ([place]) 2021   Tobacco dependence  chantix: "psych effects"     Past Surgical History:  Procedure Laterality Date   COLONOSCOPY  2005   Recall 10 yrs ([place])   LOOP RECORDER INSERTION N/A 07/03/2019   Procedure: LOOP RECORDER INSERTION;  Surgeon: Thompson Grayer, MD;  Location: [facility];  Service: Cardiovascular;  Laterality: N/A;   removal of kidney stones  2006   cystoscopic--removed from ureter.  No prob since.   Family History  Problem Relation Age of Onset   Colon cancer Mother    Cancer Father    Breast cancer Sister    Breast cancer Sister    Stomach cancer Neg Hx    Esophageal cancer Neg Hx    Colon polyps Neg Hx    Social History   Tobacco Use   Smoking status: Every Day    Packs/day: 0.50    Years: 40.00    Total pack years: 20.00    Types: Cigarettes    Start date: 24   Smokeless tobacco: Never  Vaping Use   Vaping Use: Never used  Substance Use Topics   Alcohol use: Not Currently    Alcohol/week: 8.0 standard drinks of alcohol    Types: 8 Cans of beer per week    Comment: Weekends only   Drug use: No   Current Outpatient Medications  Medication Sig Dispense Refill   acetaminophen  (TYLENOL) 325 MG tablet Take 2 tablets (650 mg total) by mouth every 4 (four) hours as needed for mild pain (or temp > 37.5 C (99.5 F)).     atorvastatin (LIPITOR) 40 MG tablet TAKE 1 TABLET BY MOUTH EVERY DAY 90 tablet 1   folic acid (FOLVITE) 1 MG tablet TAKE 1 TABLET BY MOUTH EVERY DAY 90 tablet 1   halobetasol (ULTRAVATE) 0.05 % cream Apply 1 application topically as needed (skin irritation).     metoprolol tartrate (LOPRESSOR) 25 MG tablet Take 1 tablet (25 mg total) by mouth daily. 90 tablet 2   Multiple Vitamin (MULTIVITAMIN WITH MINERALS) TABS tablet Take 1 tablet by mouth daily.     nicotine polacrilex (NICORETTE) 2 MG gum Take 1 each (2 mg total) by mouth as needed for smoking cessation. 100 tablet 0   omeprazole (PRILOSEC) 20 MG capsule Take 20 mg by mouth as needed (acid reflux).     rivaroxaban (XARELTO) 20 MG TABS tablet TAKE 1 TABLET EVERY DAY WITH SUPPER. 90 tablet 0   mirtazapine (REMERON) 7.5 MG tablet TAKE 1 TABLET BY MOUTH AT BEDTIME. (Patient not taking: Reported on 10/18/2021) 90 tablet 1   ondansetron (ZOFRAN) 4 MG tablet Take 1 tablet (4 mg total) by mouth every 8 (eight) hours as needed for nausea or vomiting. (Patient not taking: Reported on 10/18/2021) 30 tablet 1   No current facility-administered medications for this visit.   Allergies  Allergen Reactions   Penicillins Rash     Review of Systems: All systems reviewed and negative except where noted in HPI.     Physical Exam:    Wt Readings from Last 3 Encounters:  10/18/21 155 lb 12.8 oz (70.7 kg)  08/31/21 166 lb (75.3 kg)  08/17/21 166 lb 8 oz (75.5 kg)    BP 122/74   Pulse 77   Ht '5\' 10"'$  (1.778 m)   Wt 155 lb 12.8 oz (70.7 kg)   SpO2 99%   BMI 22.35 kg/m  Constitutional:  Pleasant, in no acute distress. Psychiatric: Normal mood and affect. Behavior is normal. Cardiovascular: Normal rate, regular  rhythm. No edema Pulmonary/chest: Effort normal and breath sounds normal. No wheezing, rales or  rhonchi. Abdominal: Soft, nondistended, nontender. Bowel sounds active throughout. There are no masses palpable. No hepatomegaly. Skin: Skin is warm and dry. No rashes noted.   ASSESSMENT AND PLAN;   1) Unintentional weight loss 2) Early satiety 3) Loss of appetite/Anorexia 4) Nausea/Vomiting-improving  - EGD to evaluate for mucosal/luminal pathology - CT A/P and subsequent MRI abdomen otherwise unrevealing for primary GI pathology.  Did discuss possible overlap with renal findings - Continue p.o. intake as tolerated.  Reasonable to start protein shake, boost, Ensure, etc. for additional caloric intake - If EGD unrevealing, plan for CT head - Patient and spouse deferred colonoscopy for now due to concerns about mobility limitations and tolerating bowel prep  5) Hepatic hemangioma - Cavernous hemangiomas on CT and MRI.  Otherwise normal liver enzymes (AST 45)  6) Renal cysts - Has appointment with Nephrology next month  7) Atrial fibrillation 8) Chronic anticoagulation 9) HTN 10) History of CVA - Hold Xarelto 1 days before procedure - will instruct when and how to resume after procedure. Low but real risk of cardiovascular event such as heart attack, stroke, embolism, thrombosis or ischemia/infarct of other organs off Xarelto explained and need to seek urgent help if this occurs. The patient consents to proceed. Will communicate by phone or EMR with patient's prescribing provider to confirm that holding Xarelto is reasonable in this case -Obtain Cardiology clearance to proceed with EGD   The indications, risks, and benefits of EGD were explained to the patient in detail. Risks include but are not limited to bleeding, perforation, adverse reaction to medications, and cardiopulmonary compromise. Sequelae include but are not limited to the possibility of surgery, hospitalization, and mortality. The patient verbalized understanding and wished to proceed. All questions answered, referred to  scheduler. Further recommendations pending results of the exam.     Lavena Bullion, DO, FACG  10/18/2021, 11:31 AM   Inda Coke, PA

## 2021-10-18 NOTE — Telephone Encounter (Signed)
Called pt and spoke with pt's wife informing her per Jinny Blossom, [place], that pt can call back next week if pt does not get mail order before pt runs out, to see about getting samples at that time. I advised the wife that if they have any other problems, questions or concerns, to give our office a call back. Wife verbalized understanding.

## 2021-10-18 NOTE — Telephone Encounter (Signed)
*  STAT* If patient is at the pharmacy, call can be transferred to refill team.   1. Which medications need to be refilled? (please list name of each medication and dose if known) rivaroxaban (XARELTO) 20 MG TABS tablet  2. Which pharmacy/location (including street and city if local pharmacy) is medication to be sent to? [place], [place]  3. Do they need a 30 day or 90 day supply? 90  Pt only has a week left of this medication.   Pt's wife would like to also know if they can get a 2 week sample of this medication to last until their medication comes in the mail.

## 2021-10-19 ENCOUNTER — Telehealth: Payer: Self-pay | Admitting: *Deleted

## 2021-10-19 NOTE — Telephone Encounter (Signed)
S/w the pt and his wife who are agreeable to plan of care for tele pre appt 10/24/21 @ 2:40. Med rec and consent are done.

## 2021-10-19 NOTE — Telephone Encounter (Signed)
S/w the pt and his wife who are agreeable to plan of care for tele pre appt 10/24/21 @ 2:40. Med rec and consent are done.      Patient Consent for Virtual Visit        Russell Russell has provided verbal consent on 10/19/2021 for a virtual visit (video or telephone).   CONSENT FOR VIRTUAL VISIT FOR:  Russell Russell  By participating in this virtual visit I agree to the following:  I hereby voluntarily request, consent and authorize [facility] and its employed or contracted physicians, physician assistants, nurse practitioners or other licensed health care professionals (the Practitioner), to provide me with telemedicine health care services (the "Services") as deemed necessary by the treating Practitioner. I acknowledge and consent to receive the Services by the Practitioner via telemedicine. I understand that the telemedicine visit will involve communicating with the Practitioner through live audiovisual communication technology and the disclosure of certain medical information by electronic transmission. I acknowledge that I have been given the opportunity to request an in-person assessment or other available alternative prior to the telemedicine visit and am voluntarily participating in the telemedicine visit.  I understand that I have the right to withhold or withdraw my consent to the use of telemedicine in the course of my care at any time, without affecting my right to future care or treatment, and that the Practitioner or I may terminate the telemedicine visit at any time. I understand that I have the right to inspect all information obtained and/or recorded in the course of the telemedicine visit and may receive copies of available information for a reasonable fee.  I understand that some of the potential risks of receiving the Services via telemedicine include:  Delay or interruption in medical evaluation due to technological equipment failure or disruption; Information transmitted may  not be sufficient (e.g. poor resolution of images) to allow for appropriate medical decision making by the Practitioner; and/or  In rare instances, security protocols could fail, causing a breach of personal health information.  Furthermore, I acknowledge that it is my responsibility to provide information about my medical history, conditions and care that is complete and accurate to the best of my ability. I acknowledge that Practitioner's advice, recommendations, and/or decision may be based on factors not within their control, such as incomplete or inaccurate data provided by me or distortions of diagnostic images or specimens that may result from electronic transmissions. I understand that the practice of medicine is not an exact science and that Practitioner makes no warranties or guarantees regarding treatment outcomes. I acknowledge that a copy of this consent can be made available to me via my patient portal ([place]), or I can request a printed copy by calling the office of [facility].    I understand that my insurance will be billed for this visit.   I have read or had this consent read to me. I understand the contents of this consent, which adequately explains the benefits and risks of the Services being provided via telemedicine.  I have been provided ample opportunity to ask questions regarding this consent and the Services and have had my questions answered to my satisfaction. I give my informed consent for the services to be provided through the use of telemedicine in my medical care

## 2021-10-23 ENCOUNTER — Telehealth: Payer: Self-pay | Admitting: Internal Medicine

## 2021-10-23 NOTE — Telephone Encounter (Addendum)
Called pt's wife, informing them that we were leaving 2 bottles of Xarelto 20 mg tablet at the front desk at [facility] for pt to pick up. I advised the wife that if they have any other problems, questions or concerns, to give our office a call back. Wife verbalized understanding. Lot# [id number]   Exp: 12/2022 FYI

## 2021-10-23 NOTE — Telephone Encounter (Signed)
Ok to provide 1 week of samples if pt has applied for [employer] and is waiting for mail order delivery of med. He is on the correct dose of Xarelto '20mg'$  daily. It looks like we sent in a refill to his regular mail order pharmacy on 8/9 [facility]), would clarify if he's waiting for delivery from this pharmacy since [facility] has a different program and rx would be coming from [place] if he applied for their program. We can send in a refill to their pharmacy if pt needs.

## 2021-10-23 NOTE — Telephone Encounter (Signed)
Patient calling the office for samples of medication:   1.  What medication and dosage are you requesting samples for? rivaroxaban (XARELTO) 20 MG TABS tablet  2.  Are you currently out of this medication? Pt currently only has 1 day left of this medication due to falling in to the donut hole with insurance and is waiting for [facility] to mail them some.  Would like enough samples until they receive in mail.

## 2021-10-23 NOTE — Telephone Encounter (Signed)
**Note De-Identified Amaurie Schreckengost Obfuscation** The pts wife and DPR [name] states that they are paying the pts $85 to [employer] on Wednesday when they get paid.  She states that she will pay them online.  Since this may take longer than a week for his Xarelto to arrive, I advised [name] that we will leave the pt 2 weeks of Xarelto 20 mg in the front office for them to pick up. At Dr Bonita Quin office at [facility] on [address] in [place] to pick up

## 2021-10-24 ENCOUNTER — Ambulatory Visit (INDEPENDENT_AMBULATORY_CARE_PROVIDER_SITE_OTHER): Payer: Medicare HMO | Admitting: Nurse Practitioner

## 2021-10-24 DIAGNOSIS — Z01810 Encounter for preprocedural cardiovascular examination: Secondary | ICD-10-CM

## 2021-10-24 NOTE — Progress Notes (Signed)
Virtual Visit via Telephone Note   Because of Russell Russell's co-morbid illnesses, he is at least at moderate risk for complications without adequate follow up.  This format is felt to be most appropriate for this patient at this time.  The patient did not have access to video technology/had technical difficulties with video requiring transitioning to audio format only (telephone).  All issues noted in this document were discussed and addressed.  No physical exam could be performed with this format.  Please refer to the patient's chart for his consent to telehealth for [facility].  Evaluation Performed:  Preoperative cardiovascular risk assessment _____________   Date:  10/24/2021   Patient ID:  Russell Russell, DOB 12/02/1950, MRN [id number] Patient Location:  Home Provider location:   Office  Primary Care Provider:  Inda Coke, PA Primary Cardiologist:  None  Chief Complaint / Patient Profile   71 y.o. y/o male with a h/o cryptogenic stroke s/p ILR, dismal atrial fibrillation on Xarelto, hypertension upper lipidemia, GERD, and alcohol abuse, who is pending endoscopy on 10/27/2021 with Dr. Bryan Lemma of [facility] and presents today for telephonic preoperative cardiovascular risk assessment.  Past Medical History    Past Medical History:  Diagnosis Date   Alcohol abuse    drinks 1 gallon of Gin every weekend, nothing during the week x >15 yrs   Atrial fibrillation (HCC)    Elevated transaminase level    +elev bili: abd u/s 06/2014 showed stable small hepatic hemangiomas, o/w normal.   Essential hypertension    GERD (gastroesophageal reflux disease)    Hyperlipidemia 03/2014   Atorv started-chol improved   Hypertension    Impaired fasting glucose 03/2014   Nephrolithiasis    PUD (peptic ulcer disease)    Stroke ([place]) 2021   Tobacco dependence    chantix: "psych effects"   Past Surgical History:  Procedure Laterality Date   COLONOSCOPY  2005   Recall 10 yrs  ([place])   LOOP RECORDER INSERTION N/A 07/03/2019   Procedure: LOOP RECORDER INSERTION;  Surgeon: Thompson Grayer, MD;  Location: [facility];  Service: Cardiovascular;  Laterality: N/A;   removal of kidney stones  2006   cystoscopic--removed from ureter.  No prob since.    Allergies  Allergies  Allergen Reactions   Penicillins Rash    History of Present Illness    Russell Russell is a 71 y.o. male who presents via audio/video conferencing for a telehealth visit today.  Pt was last seen in cardiology clinic on 04/10/2021 by Joesph July, PA.  At that time Russell Russell was doing well.  The patient is now pending procedure as outlined above. Since his last visit, he  has been stable from a cardiac standpoint. He denies chest pain, palpitations, dyspnea, pnd, orthopnea, n, v, dizziness, syncope, edema, weight gain, or early satiety. All other systems reviewed and are otherwise negative except as noted above.   Home Medications    Prior to Admission medications   Medication Sig Start Date End Date Taking? Authorizing Provider  acetaminophen (TYLENOL) 325 MG tablet Take 2 tablets (650 mg total) by mouth every 4 (four) hours as needed for mild pain (or temp > 37.5 C (99.5 F)). 08/04/19   Angiulli, Lavon Paganini, PA-C  atorvastatin (LIPITOR) 40 MG tablet TAKE 1 TABLET BY MOUTH EVERY DAY 06/12/21   Inda Coke, PA  folic acid (FOLVITE) 1 MG tablet TAKE 1 TABLET BY MOUTH EVERY DAY 09/22/21   Inda Coke, PA  halobetasol Rhett Bannister)  0.05 % cream Apply 1 application topically as needed (skin irritation).    [provider]  metoprolol tartrate (LOPRESSOR) 25 MG tablet Take 1 tablet (25 mg total) by mouth daily. 08/21/21   Shirley Friar, PA-C  mirtazapine (REMERON) 7.5 MG tablet TAKE 1 TABLET BY MOUTH AT BEDTIME. Patient not taking: Reported on 10/18/2021 09/25/21   Inda Coke, PA  Multiple Vitamin (MULTIVITAMIN WITH MINERALS) TABS tablet Take 1 tablet by mouth daily.  08/04/19   Angiulli, Lavon Paganini, PA-C  nicotine polacrilex (NICORETTE) 2 MG gum Take 1 each (2 mg total) by mouth as needed for smoking cessation. 08/31/21   Inda Coke, PA  omeprazole (PRILOSEC) 20 MG capsule Take 20 mg by mouth as needed (acid reflux).    [provider]  ondansetron (ZOFRAN) 4 MG tablet Take 1 tablet (4 mg total) by mouth every 8 (eight) hours as needed for nausea or vomiting. Patient not taking: Reported on 10/18/2021 09/11/21   Inda Coke, PA  rivaroxaban (XARELTO) 20 MG TABS tablet TAKE 1 TABLET EVERY DAY WITH SUPPER. 10/18/21   Thompson Grayer, MD    Physical Exam    Vital Signs:  Russell Russell does not have vital signs available for review today.  Given telephonic nature of communication, physical exam is limited. AAOx3. NAD. Normal affect.  Speech and respirations are unlabored.  Accessory Clinical Findings    None  Assessment & Plan    1.  Preoperative Cardiovascular Risk Assessment:  According to the Revised Cardiac Risk Index (RCRI), his Perioperative Risk of Major Cardiac Event is (%): 0.9. His Functional Capacity in METs is: 6.7 according to the Duke Activity Status Index (DASI). Therefore, based on ACC/AHA guidelines, patient would be at acceptable risk for the planned procedure without further cardiovascular testing.  Patient with diagnosis of afib on Xarelto for anticoagulation.     Procedure: endoscopy Date of procedure: 10/27/21     CHA2DS2-VASc Score = 4   This indicates a 4.8% annual risk of stroke. The patient's score is based upon: CHF History: 0 HTN History: 1 Diabetes History: 0 Stroke History: 2 Vascular Disease History: 0 Age Score: 1 Gender Score: 0       CrCl 61 ml/min   Due to pt hx of stroke and bleed risk of procedure, patient may hold Xarelto for 1 day prior to procedure. Patient should resume anticoagulation as soon as possible post procedure.  A copy of this note will be routed to requesting surgeon.  Time:    Today, I have spent 4 minutes with the patient with telehealth technology discussing medical history, symptoms, and management plan.     Lenna Sciara, NP  10/24/2021, 2:47 PM

## 2021-10-24 NOTE — Telephone Encounter (Signed)
Lm on vm regarding Xarelto. Asked for return phone call

## 2021-10-25 NOTE — Telephone Encounter (Signed)
Patient made aware to hold it 1 day prior per preop pool and he knows and understands

## 2021-10-26 ENCOUNTER — Telehealth: Payer: Self-pay

## 2021-10-26 NOTE — Telephone Encounter (Signed)
Patient told when to hold Xarelto

## 2021-10-26 NOTE — Telephone Encounter (Signed)
-----   Message from Marice Potter, RN sent at 10/25/2021  1:09 PM EDT ----- Regarding: RE: Cardiac clearance Ok perfect, thank you!  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  I left a message on his voicemail to call me back about this.  I will keep calling him until I make contact.   Magda Paganini ----- Message ----- From: Marice Potter, RN Sent: 10/20/2021  10:24 AM EDT To: Villa Herb, CMA; Howell Pringle, CMA Subject: RE: Cardiac clearance                          Ok will do thanks!  ----- Message ----- From: Howell Pringle, CMA Sent: 10/20/2021   9:53 AM EDT To: Villa Herb, CMA; Marice Potter, RN Subject: Cardiac clearance                              Cardiology clearance needed. Scheduled egd at [facility] - 10/27/21, tele appt. with them - 10/24/21 @ 2.40 pm. Please keep eye on that. I won't be at the clinic. Thanks!

## 2021-10-27 ENCOUNTER — Encounter: Payer: Self-pay | Admitting: Gastroenterology

## 2021-10-27 ENCOUNTER — Ambulatory Visit (AMBULATORY_SURGERY_CENTER): Payer: Medicare HMO | Admitting: Gastroenterology

## 2021-10-27 VITALS — BP 98/73 | HR 88 | Temp 98.6°F | Resp 16 | Ht 70.0 in | Wt 155.0 lb

## 2021-10-27 DIAGNOSIS — K449 Diaphragmatic hernia without obstruction or gangrene: Secondary | ICD-10-CM

## 2021-10-27 DIAGNOSIS — K269 Duodenal ulcer, unspecified as acute or chronic, without hemorrhage or perforation: Secondary | ICD-10-CM

## 2021-10-27 DIAGNOSIS — K2970 Gastritis, unspecified, without bleeding: Secondary | ICD-10-CM

## 2021-10-27 DIAGNOSIS — R112 Nausea with vomiting, unspecified: Secondary | ICD-10-CM | POA: Diagnosis not present

## 2021-10-27 DIAGNOSIS — R6881 Early satiety: Secondary | ICD-10-CM | POA: Diagnosis not present

## 2021-10-27 DIAGNOSIS — B9681 Helicobacter pylori [H. pylori] as the cause of diseases classified elsewhere: Secondary | ICD-10-CM | POA: Diagnosis not present

## 2021-10-27 DIAGNOSIS — R634 Abnormal weight loss: Secondary | ICD-10-CM | POA: Diagnosis not present

## 2021-10-27 DIAGNOSIS — K2950 Unspecified chronic gastritis without bleeding: Secondary | ICD-10-CM | POA: Diagnosis not present

## 2021-10-27 DIAGNOSIS — R630 Anorexia: Secondary | ICD-10-CM | POA: Diagnosis not present

## 2021-10-27 DIAGNOSIS — K2980 Duodenitis without bleeding: Secondary | ICD-10-CM

## 2021-10-27 MED ORDER — PANTOPRAZOLE SODIUM 40 MG PO TBEC: 40.0000 mg | DELAYED_RELEASE_TABLET | Freq: Two times a day (BID) | ORAL | 3 refills | Status: DC

## 2021-10-27 MED ORDER — SUCRALFATE 1 GM/10ML PO SUSP: 1.0000 g | Freq: Four times a day (QID) | ORAL | 1 refills | Status: DC

## 2021-10-27 MED ORDER — SODIUM CHLORIDE 0.9 % IV SOLN: 500.0000 mL | Freq: Once | INTRAVENOUS | Status: DC

## 2021-10-27 NOTE — Progress Notes (Signed)
VS completed by CW.   Pt's states no medical or surgical changes since previsit or office visit.  

## 2021-10-27 NOTE — Patient Instructions (Addendum)
RESUME XARELTO ( RIVAROXABAN ) AT PRIOR DOSE TOMORROW.   USE PROTONIX ( PANTOPRAZOLE) 40 MG BY MOUTH TWICE DAILY FOR 8 WEEKS , THEN REDUCE TO 40 MG DAILY.   USE SUCRALFATE SUSPENSION 1 GRAM BY MOUTH 4 TIMES A DAY FOR 4 WEEKS.   REPEAT UPPER ENDOSCOPY IN 8 WEEKS TO CHECK HEALING.   YOU HAD AN ENDOSCOPIC PROCEDURE TODAY AT [facility]:   Refer to the procedure report that was given to you for any specific questions about what was found during the examination.  If the procedure report does not answer your questions, please call your gastroenterologist to clarify.  If you requested that your care partner not be given the details of your procedure findings, then the procedure report has been included in a sealed envelope for you to review at your convenience later.  YOU SHOULD EXPECT: Some feelings of bloating in the abdomen. Passage of more gas than usual.  Walking can help get rid of the air that was put into your GI tract during the procedure and reduce the bloating. If you had a lower endoscopy (such as a colonoscopy or flexible sigmoidoscopy) you may notice spotting of blood in your stool or on the toilet paper. If you underwent a bowel prep for your procedure, you may not have a normal bowel movement for a few days.  Please Note:  You might notice some irritation and congestion in your nose or some drainage.  This is from the oxygen used during your procedure.  There is no need for concern and it should clear up in a day or so.  SYMPTOMS TO REPORT IMMEDIATELY:  Following upper endoscopy (EGD)  Vomiting of blood or coffee ground material  New chest pain or pain under the shoulder blades  Painful or persistently difficult swallowing  New shortness of breath  Fever of 100F or higher  Black, tarry-looking stools  For urgent or emergent issues, a gastroenterologist can be reached at any hour by calling [phone]. Do not use MyChart messaging for urgent concerns.     DIET:  We do recommend a small meal at first, but then you may proceed to your regular diet.  Drink plenty of fluids but you should avoid alcoholic beverages for 24 hours.  ACTIVITY:  You should plan to take it easy for the rest of today and you should NOT DRIVE or use heavy machinery until tomorrow (because of the sedation medicines used during the test).    FOLLOW UP: Our staff will call the number listed on your records the next business day following your procedure.  We will call around 7:15- 8:00 am to check on you and address any questions or concerns that you may have regarding the information given to you following your procedure. If we do not reach you, we will leave a message.  If you develop any symptoms (ie: fever, flu-like symptoms, shortness of breath, cough etc.) before then, please call [phone].  If you test positive for Covid 19 in the 2 weeks post procedure, please call and report this information to [place].    If any biopsies were taken you will be contacted by phone or by letter within the next 1-3 weeks.  Please call us at [phone] if you have not heard about the biopsies in 3 weeks.    SIGNATURES/CONFIDENTIALITY: You and/or your care partner have signed paperwork which will be entered into your electronic medical record.  These signatures attest to the fact that  that the information above on your After Visit Summary has been reviewed and is understood.  Full responsibility of the confidentiality of this discharge information lies with you and/or your care-partner.

## 2021-10-27 NOTE — Op Note (Signed)
[facility] Patient Name: Russell Russell Procedure Date: 10/27/2021 9:58 AM MRN: [id number] Endoscopist: Gerrit Heck , MD Age: 71 Referring MD:  Date of Birth: [birth year] Gender: Male Account #: [id number] Procedure:                Upper GI endoscopy Indications:              Anorexia/Decreased appetite, Early satiety, Nausea                            with vomiting, Weight loss Medicines:                Monitored Anesthesia Care Procedure:                Pre-Anesthesia Assessment:                           - Prior to the procedure, a History and Physical                            was performed, and patient medications and                            allergies were reviewed. The patient's tolerance of                            previous anesthesia was also reviewed. The risks                            and benefits of the procedure and the sedation                            options and risks were discussed with the patient.                            All questions were answered, and informed consent                            was obtained. Prior Anticoagulants: The patient has                            taken Xarelto (rivaroxaban), last dose was 2 days                            prior to procedure. ASA Grade Assessment: III - A                            patient with severe systemic disease. After                            reviewing the risks and benefits, the patient was                            deemed in satisfactory condition to undergo the  procedure.                           After obtaining informed consent, the endoscope was                            passed under direct vision. Throughout the                            procedure, the patient's blood pressure, pulse, and                            oxygen saturations were monitored continuously. The                            Endoscope was introduced through the mouth, and                             advanced to the second part of duodenum. The upper                            GI endoscopy was accomplished without difficulty.                            The patient tolerated the procedure well. Scope In: Scope Out: Findings:                 A non-obstructing Schatzki ring was found in the                            lower third of the esophagus.                           A 4 cm hiatal hernia was present.                           Diffuse moderate inflammation characterized by                            congestion (edema) and erythema was found in the                            entire examined stomach, with a few small                            non-bleeding erosionsi in the gastric antrum.                            Biopsies were taken with a cold forceps for                            histology. Estimated blood loss was minimal.                           Moderate inflammation characterized by congestion                            (  edema), erythema and shallow ulcerations was found                            in the duodenal bulb. Biopsies were taken with a                            cold forceps for histology. Estimated blood loss                            was minimal.                           A few 4 to 6 mm nodules were found in the second                            portion of the duodenum. Complications:            No immediate complications. Estimated Blood Loss:     Estimated blood loss was minimal. Impression:               - Non-obstructing Schatzki ring.                           - 4 cm hiatal hernia.                           - Gastritis. Biopsied.                           - Duodenitis. Biopsied.                           - Nodules found in the duodenum. Appearance favors                            either peptic duodenitis and less likely pancretic                            rest. Recommendation:           - Patient has a contact number available for                             emergencies. The signs and symptoms of potential                            delayed complications were discussed with the                            patient. Return to normal activities tomorrow.                            Written discharge instructions were provided to the                            patient.                           -  Resume previous diet.                           - Continue present medications.                           - Await pathology results.                           - Resume Xarelto (rivaroxaban) at prior dose                            tomorrow.                           - Use Protonix (pantoprazole) 40 mg PO BID for 8                            weeks, then reduce to 40 mg daily.                           - Use sucralfate suspension 1 gram PO QID for 4                            weeks.                           - Repeat upper endoscopy in 8 weeks to check                            healing.                           - If duodenal nodules still present of repeat                            endoscopy, will perform biopsies with referral to                            EUS as appropriate. Gerrit Heck, MD 10/27/2021 10:31:14 AM

## 2021-10-27 NOTE — Progress Notes (Signed)
See note dated 10/18/2021 for H&P details. No clinical changes since then.

## 2021-10-27 NOTE — Progress Notes (Signed)
A and O x3. Report to RN. Tolerated MAC anesthesia well.Teeth unchanged after procedure. 

## 2021-10-27 NOTE — Progress Notes (Signed)
Called to room to assist during endoscopic procedure.  Patient ID and intended procedure confirmed with present staff. Received instructions for my participation in the procedure from the performing physician.  

## 2021-10-30 ENCOUNTER — Telehealth: Payer: Self-pay

## 2021-10-30 NOTE — Telephone Encounter (Signed)
Agree with advice as given.  Unfortunately, there is a possibility that could occur w/ the bite block, which is necessary for the procedure.  Agree with contacting his dentist for evaluation.  Thank you for the update.

## 2021-10-30 NOTE — Telephone Encounter (Signed)
  Follow up Call-     10/27/2021    8:52 AM  Call back number  Post procedure Call Back phone  # [phone] wifes number  Permission to leave phone message Yes     Patient questions:  Do you have a fever, pain , or abdominal swelling? No. Pain Score  0 *  Have you tolerated food without any problems? Yes.    Have you been able to return to your normal activities? Yes.    Do you have any questions about your discharge instructions: Diet   No. Medications  No. Follow up visit  No.  Do you have questions or concerns about your Care? No.  Actions: * If pain score is 4 or above: No action needed, pain <4.   Pt wife states upon leaving [facility], pt was in the car and took something out of his mouth and noticed he had a piece of his tooth that had broken off

## 2021-11-03 ENCOUNTER — Other Ambulatory Visit: Payer: Self-pay

## 2021-11-03 MED ORDER — DOXYCYCLINE HYCLATE 100 MG PO CAPS: 100.0000 mg | ORAL_CAPSULE | Freq: Two times a day (BID) | ORAL | 0 refills | Status: DC

## 2021-11-03 MED ORDER — METRONIDAZOLE 250 MG PO TABS: 250.0000 mg | ORAL_TABLET | Freq: Four times a day (QID) | ORAL | 0 refills | Status: DC

## 2021-11-03 MED ORDER — BISMUTH SUBSALICYLATE 262 MG PO CHEW: 524.0000 mg | CHEWABLE_TABLET | Freq: Four times a day (QID) | ORAL | 0 refills | Status: DC

## 2021-11-03 NOTE — Progress Notes (Signed)
[facility]link Summary Report / Loop Recorder 

## 2021-11-06 ENCOUNTER — Ambulatory Visit: Payer: Medicare HMO | Attending: Internal Medicine

## 2021-11-06 DIAGNOSIS — I639 Cerebral infarction, unspecified: Secondary | ICD-10-CM | POA: Diagnosis not present

## 2021-11-08 LAB — CUP PACEART REMOTE DEVICE CHECK
Date Time Interrogation Session: 20230817230339
Implantable Pulse Generator Implant Date: 20210423

## 2021-11-16 ENCOUNTER — Telehealth: Payer: Self-pay | Admitting: Internal Medicine

## 2021-11-16 NOTE — Telephone Encounter (Signed)
**Note De-Identified Tahari Clabaugh Obfuscation** Russell Russell, the pts wife and DPR states that they do not have the $85 to send to [facility] right now for a 30 day supply of Xarelto as they are waiting on the pts check that will be there on the 3rd Wednesday of this month. She states that due to some unexpected bills they ran short of money this month.  She is advised that we will leave the pt 2 weeks of Xarelto 20 mg samples in the front office for them to pick up.  She thanked me for our assistance.

## 2021-11-16 NOTE — Telephone Encounter (Signed)
Wife called to request samples of rivaroxaban (XARELTO) 20 MG TABS tablet as the patient is running out of this medication and will not be able to refill this medication until 11/29/21.

## 2021-11-16 NOTE — Telephone Encounter (Signed)
Leaving 2 bottles of Xarelto 20 mg tablet at the front desk for pt to pick up. Thanks

## 2021-11-16 NOTE — Telephone Encounter (Signed)
Patient's wife requested samples of Xarelto to last until 11/29/21. She states they have not completed the process for Russell Russell and [place] patient assistance because it requires W2 forms and they have not completed their taxes yet. Wife states they had it filled  initially through [place] and the cost is $80 and they cannot current afford this amount. Advised her I would have to consult with our patient assistance advocate and get back to her. Mrs Hauser agreed to this.

## 2021-11-20 DIAGNOSIS — I10 Essential (primary) hypertension: Secondary | ICD-10-CM | POA: Diagnosis not present

## 2021-11-20 DIAGNOSIS — I639 Cerebral infarction, unspecified: Secondary | ICD-10-CM | POA: Diagnosis not present

## 2021-11-20 DIAGNOSIS — N182 Chronic kidney disease, stage 2 (mild): Secondary | ICD-10-CM | POA: Diagnosis not present

## 2021-11-20 DIAGNOSIS — I129 Hypertensive chronic kidney disease with stage 1 through stage 4 chronic kidney disease, or unspecified chronic kidney disease: Secondary | ICD-10-CM | POA: Diagnosis not present

## 2021-11-20 DIAGNOSIS — I480 Paroxysmal atrial fibrillation: Secondary | ICD-10-CM | POA: Diagnosis not present

## 2021-11-20 DIAGNOSIS — N261 Atrophy of kidney (terminal): Secondary | ICD-10-CM | POA: Diagnosis not present

## 2021-11-21 DIAGNOSIS — I10 Essential (primary) hypertension: Secondary | ICD-10-CM | POA: Diagnosis not present

## 2021-11-21 DIAGNOSIS — E559 Vitamin D deficiency, unspecified: Secondary | ICD-10-CM | POA: Diagnosis not present

## 2021-11-21 DIAGNOSIS — N182 Chronic kidney disease, stage 2 (mild): Secondary | ICD-10-CM | POA: Diagnosis not present

## 2021-11-21 DIAGNOSIS — I480 Paroxysmal atrial fibrillation: Secondary | ICD-10-CM | POA: Diagnosis not present

## 2021-11-21 DIAGNOSIS — I129 Hypertensive chronic kidney disease with stage 1 through stage 4 chronic kidney disease, or unspecified chronic kidney disease: Secondary | ICD-10-CM | POA: Diagnosis not present

## 2021-11-21 DIAGNOSIS — N261 Atrophy of kidney (terminal): Secondary | ICD-10-CM | POA: Diagnosis not present

## 2021-11-21 DIAGNOSIS — I639 Cerebral infarction, unspecified: Secondary | ICD-10-CM | POA: Diagnosis not present

## 2021-11-21 LAB — PROTEIN / CREATININE RATIO, URINE: Creatinine, Urine: 247.9

## 2021-11-21 LAB — CBC: RBC: 4.3 (ref 3.87–5.11)

## 2021-11-21 LAB — BASIC METABOLIC PANEL
BUN: 8 (ref 4–21)
CO2: 25 — AB (ref 13–22)
Chloride: 102 (ref 99–108)
Creatinine: 1.2 (ref 0.6–1.3)
Glucose: 104
Potassium: 3.2 mEq/L — AB (ref 3.5–5.1)
Sodium: 142 (ref 137–147)

## 2021-11-21 LAB — COMPREHENSIVE METABOLIC PANEL
Albumin: 3.7 (ref 3.5–5.0)
Calcium: 9.4 (ref 8.7–10.7)
eGFR: 65

## 2021-11-21 LAB — CBC AND DIFFERENTIAL
HCT: 40 — AB (ref 41–53)
Hemoglobin: 12.9 — AB (ref 13.5–17.5)
Neutrophils Absolute: 2.2
Platelets: 227 10*3/uL (ref 150–400)
WBC: 4.6

## 2021-11-21 LAB — VITAMIN D 25 HYDROXY (VIT D DEFICIENCY, FRACTURES): Vit D, 25-Hydroxy: 28

## 2021-11-27 ENCOUNTER — Encounter: Payer: Self-pay | Admitting: Physician Assistant

## 2021-11-29 NOTE — Progress Notes (Signed)
[facility]link Summary Report / Loop Recorder 

## 2021-12-04 ENCOUNTER — Telehealth: Payer: Self-pay | Admitting: Internal Medicine

## 2021-12-04 ENCOUNTER — Other Ambulatory Visit: Payer: Self-pay

## 2021-12-04 MED ORDER — RIVAROXABAN 20 MG PO TABS: ORAL_TABLET | ORAL | 3 refills | Status: DC

## 2021-12-04 NOTE — Telephone Encounter (Signed)
**Note De-Identified Russell Russell Obfuscation** This is Dr Bonita Quin pt and it is unclear who the pt will see going forward but per his last office visit note on 04/10/2021 with Oda Kilts, PA-c:  Would potentially establish with Dr. Quentin Ore, as he may eventually be watchman candidate.        I have e-scribed the pts Xarelto RX to [facility] pharmacy but they do require a printed and signed by an MD Xarelto 20 mg RX for # 90 with 3 refills faxed to [facility] at [phone].  Forwarding to triage for assistance with the Xarelto RX.

## 2021-12-04 NOTE — Telephone Encounter (Signed)
Script signed by DOD Dr. Irish Lack and sent to [facility], LPN via Email.

## 2021-12-04 NOTE — Telephone Encounter (Signed)
Follow Up:    Wife called and said Russell Russell say that they still have not received patient prescription for his Xarelto. Please fax to [phone]

## 2021-12-05 NOTE — Telephone Encounter (Signed)
**Note De-Identified Russell Russell Obfuscation** Xarelto 20 mg RX for #90 with 3 refills signed by DOD, Dr Irish Lack faxed to [facility].  Fax: Tx 'ok' Report CONE_EMAIL-to-Fax Jais Demir, Mardene Celeste   This message was sent Yasiel Goyne [facility], a product from [employer] http://www.biscom.com/  [employer] pioneered [employer] and continues to innovate the most advanced and intelligent fax and secure messaging solutions for enterprises. www.biscom.com                     -------Fax Transmission Report-------  To:               Recipient at [phone] Subject:          Fw: [place] Result:           The transmission was successful. Explanation:      All Pages Ok Pages Sent:       3 Connect Time:     1 minutes, 35 seconds Transmit Time:    12/05/2021 08:16 Transfer Rate:    14400 Status Code:      0000 Retry Count:      0 Job Id:           [id number] Unique Id:        [id number] Fax Line:         5 Fax Server:       [employer]

## 2021-12-11 ENCOUNTER — Ambulatory Visit (INDEPENDENT_AMBULATORY_CARE_PROVIDER_SITE_OTHER): Payer: Medicare HMO

## 2021-12-11 DIAGNOSIS — I639 Cerebral infarction, unspecified: Secondary | ICD-10-CM

## 2021-12-12 LAB — CUP PACEART REMOTE DEVICE CHECK
Date Time Interrogation Session: 20231001231627
Implantable Pulse Generator Implant Date: 20210423

## 2021-12-15 ENCOUNTER — Telehealth: Payer: Self-pay

## 2021-12-15 NOTE — Telephone Encounter (Signed)
Noted on PV chart ?

## 2021-12-15 NOTE — Telephone Encounter (Signed)
Russell Russell  Will you plea obtain a Xarelto hold for this patient as he has been scheduled for a repeat EGD on 01/02/2022 with Dr. Bryan Lemma.  His Pre Visit appt is on 12/27/2021.  He had a 1 day hold for his procedure in 10/2021.  Just wondering if we could possibly use that same hold- please request cardiology's approval-thank you

## 2021-12-15 NOTE — Telephone Encounter (Signed)
Lesly Rubenstein, Per Dr. Bryan Lemma  we do not need to obtain another cardiac clearance to hold xarelto for this patient. He is ok with the last clearance we received on 10/18/21.

## 2021-12-25 ENCOUNTER — Encounter: Payer: Self-pay | Admitting: Physician Assistant

## 2021-12-26 ENCOUNTER — Encounter: Payer: Self-pay | Admitting: Physician Assistant

## 2021-12-26 ENCOUNTER — Ambulatory Visit (INDEPENDENT_AMBULATORY_CARE_PROVIDER_SITE_OTHER): Payer: Medicare HMO | Admitting: Physician Assistant

## 2021-12-26 VITALS — BP 126/70 | HR 76 | Temp 97.7°F | Ht 70.0 in | Wt 166.5 lb

## 2021-12-26 DIAGNOSIS — Z720 Tobacco use: Secondary | ICD-10-CM

## 2021-12-26 DIAGNOSIS — I739 Peripheral vascular disease, unspecified: Secondary | ICD-10-CM | POA: Diagnosis not present

## 2021-12-26 MED ORDER — NICOTINE POLACRILEX 4 MG MT LOZG: 4.0000 mg | LOZENGE | OROMUCOSAL | 0 refills | Status: DC | PRN

## 2021-12-26 NOTE — Progress Notes (Signed)
Russell Russell is a 71 y.o. male here for a new problem.   History of Present Illness:   Chief Complaint  Patient presents with   feet problem    Pt c/o tops of feet itching and scratches that they bleed x several weeks    HPI  Patient is here with his wife, Russell Russell.  Bilateral feet itching He reports that both of his feet have been itching for the past couple of weeks. He has been scratching which is causing it to bleed. He denies any other itchy rashes. He has applied neosporin ointment, and it has not helped him. He denies of any other OTC medication or ointment other than neosporin.  Per his wife, he has history of poor blood flow in his legs. Saw [facility] specialist prior to his stroke and was told that he had poor blood flow. He continues to smoke daily. He cannot tolerate patches due to skin irritation. He cannot tolerate gum due to poor dentition.    Past Medical History:  Diagnosis Date   Alcohol abuse    drinks 1 gallon of Gin every weekend, nothing during the week x >15 yrs   Atrial fibrillation (HCC)    Elevated transaminase level    +elev bili: abd u/s 06/2014 showed stable small hepatic hemangiomas, o/w normal.   Essential hypertension    GERD (gastroesophageal reflux disease)    Hyperlipidemia 03/2014   Atorv started-chol improved   Hypertension    Impaired fasting glucose 03/2014   Nephrolithiasis    PUD (peptic ulcer disease)    Stroke ([place]) 2021   Tobacco dependence    chantix: "psych effects"     Social History   Tobacco Use   Smoking status: Every Day    Packs/day: 0.50    Years: 40.00    Total pack years: 20.00    Types: Cigarettes    Start date: 1974   Smokeless tobacco: Never  Vaping Use   Vaping Use: Never used  Substance Use Topics   Alcohol use: Not Currently    Alcohol/week: 8.0 standard drinks of alcohol    Types: 8 Cans of beer per week    Comment: Weekends only   Drug use: No    Past Surgical History:  Procedure  Laterality Date   COLONOSCOPY  2005   Recall 10 yrs ([place])   LOOP RECORDER INSERTION N/A 07/03/2019   Procedure: LOOP RECORDER INSERTION;  Surgeon: Thompson Grayer, MD;  Location: [facility];  Service: Cardiovascular;  Laterality: N/A;   removal of kidney stones  2006   cystoscopic--removed from ureter.  No prob since.    Family History  Problem Relation Age of Onset   Colon cancer Mother    Cancer Father    Breast cancer Sister    Breast cancer Sister    Stomach cancer Neg Hx    Esophageal cancer Neg Hx    Colon polyps Neg Hx     Allergies  Allergen Reactions   Penicillins Rash    Current Medications:   Current Outpatient Medications:    acetaminophen (TYLENOL) 325 MG tablet, Take 2 tablets (650 mg total) by mouth every 4 (four) hours as needed for mild pain (or temp > 37.5 C (99.5 F))., Disp:  , Rfl:    atorvastatin (LIPITOR) 40 MG tablet, TAKE 1 TABLET BY MOUTH EVERY DAY, Disp: 90 tablet, Rfl: 1   bismuth subsalicylate (PEPTO-BISMOL) 262 MG chewable tablet, Chew 2 tablets (524 mg total) by mouth  4 (four) times daily., Disp: 112 tablet, Rfl: 0   folic acid (FOLVITE) 1 MG tablet, TAKE 1 TABLET BY MOUTH EVERY DAY, Disp: 90 tablet, Rfl: 1   halobetasol (ULTRAVATE) 0.05 % cream, Apply 1 application topically as needed (skin irritation)., Disp: , Rfl:    metoprolol tartrate (LOPRESSOR) 25 MG tablet, Take 1 tablet (25 mg total) by mouth daily., Disp: 90 tablet, Rfl: 2   Multiple Vitamin (MULTIVITAMIN WITH MINERALS) TABS tablet, Take 1 tablet by mouth daily., Disp:  , Rfl:    nicotine polacrilex (COMMIT) 4 MG lozenge, Take 1 lozenge (4 mg total) by mouth as needed for smoking cessation., Disp: 100 tablet, Rfl: 0   omeprazole (PRILOSEC) 20 MG capsule, Take 20 mg by mouth as needed (acid reflux)., Disp: , Rfl:    pantoprazole (PROTONIX) 40 MG tablet, Take 1 tablet (40 mg total) by mouth 2 (two) times daily., Disp: 90 tablet, Rfl: 3   rivaroxaban (XARELTO) 20 MG TABS tablet,  TAKE 1 TABLET EVERY DAY WITH SUPPER., Disp: 90 tablet, Rfl: 3   Review of Systems:   Review of Systems  Skin:  Negative for itching (itching on both feet).    Vitals:   Vitals:   12/26/21 1359  BP: 126/70  Pulse: 76  Temp: 97.7 F (36.5 C)  TempSrc: Temporal  SpO2: 98%  Weight: 166 lb 8 oz (75.5 kg)  Height: '5\' 10"'$  (1.778 m)     Body mass index is 23.89 kg/m.  Physical Exam:   Physical Exam Vitals and nursing note reviewed.  Constitutional:      Appearance: He is well-developed.  HENT:     Head: Normocephalic.  Eyes:     Conjunctiva/sclera: Conjunctivae normal.     Pupils: Pupils are equal, round, and reactive to light.  Pulmonary:     Effort: Pulmonary effort is normal.  Musculoskeletal:        General: Normal range of motion.     Cervical back: Normal range of motion.  Feet:     Comments: Bilateral feet cold Faint pulses felt in PD and PT Left foot with 3 areas of hyperpigmented thickened plaque, one of which slight superficial skin loss with serosanguinous drainage Right foot with 1 area of hyperpigmented thickened plaque Skin:    General: Skin is warm and dry.  Neurological:     Mental Status: He is alert and oriented to person, place, and time.  Psychiatric:        Behavior: Behavior normal.        Thought Content: Thought content normal.        Judgment: Judgment normal.       Assessment and Plan:   PAD (peripheral artery disease) ([place]) Referral to Vein and Vascular Handout provided No evidence of infection warranting abx at this time Recommend Sarna lotion and excellent hydration/moisturization to feet If new/worsening sx, close follow-up with [place] until he is seen by VV  Tobacco abuse I have sent in nicotine lozenges for him to try -- he cannot tolerate patches or gums  I,Param Shah,acting as a scribe for [employer], PA.,have documented all relevant documentation on the behalf of Inda Coke, PA,as directed by  Inda Coke, PA  while in the presence of Inda Coke, [place].  I, Inda Coke, [place], have reviewed all documentation for this visit. The documentation on 12/26/21 for the exam, diagnosis, procedures, and orders are all accurate and complete.   Inda Coke, PA-C

## 2021-12-26 NOTE — Patient Instructions (Signed)
It was great to see you!  Referral today to the Vein and Vascular specialists with [facility]  Trial the Sarna lotion       Take care,  Inda Coke PA-C

## 2021-12-26 NOTE — Progress Notes (Signed)
[facility]link Summary Report / Loop Recorder 

## 2021-12-27 ENCOUNTER — Encounter: Payer: Self-pay | Admitting: Gastroenterology

## 2021-12-27 ENCOUNTER — Ambulatory Visit (AMBULATORY_SURGERY_CENTER): Payer: Self-pay

## 2021-12-27 VITALS — Ht 70.0 in | Wt 167.0 lb

## 2021-12-27 DIAGNOSIS — K2990 Gastroduodenitis, unspecified, without bleeding: Secondary | ICD-10-CM

## 2021-12-27 DIAGNOSIS — K269 Duodenal ulcer, unspecified as acute or chronic, without hemorrhage or perforation: Secondary | ICD-10-CM

## 2021-12-27 DIAGNOSIS — K2970 Gastritis, unspecified, without bleeding: Secondary | ICD-10-CM

## 2021-12-27 DIAGNOSIS — N182 Chronic kidney disease, stage 2 (mild): Secondary | ICD-10-CM | POA: Diagnosis not present

## 2021-12-27 NOTE — Progress Notes (Signed)

## 2021-12-28 ENCOUNTER — Other Ambulatory Visit: Payer: Self-pay | Admitting: *Deleted

## 2021-12-28 ENCOUNTER — Ambulatory Visit (HOSPITAL_COMMUNITY)
Admission: RE | Admit: 2021-12-28 | Discharge: 2021-12-28 | Disposition: A | Discharge status: Home / Self Care | Payer: Medicare HMO | Source: Ambulatory Visit | Attending: Vascular Surgery | Admitting: Vascular Surgery

## 2021-12-28 DIAGNOSIS — M79606 Pain in leg, unspecified: Secondary | ICD-10-CM

## 2021-12-28 NOTE — Progress Notes (Signed)
Office Note     CC: Bilateral lower extremity foot wounds Requesting Provider:  Inda Coke, PA  HPI: Russell Russell is a 71 y.o. (1950/04/28) male presenting at the request of .Inda Coke, [place] for bilateral lower extremity foot wounds.  On exam, Rawley was doing well, accompanied by his wife.  Native of [place], he now lives in [place] with his wife.  He first appreciated the foot wounds 2 weeks ago.  Since that time, they have continued to ooze.  What initially started off as 1 area of concern on the dorsal aspect of the left leg became bilateral.  He describes severe itching, which leads him to scratching the lesions causing them to ooze.  He was sent to vein and vascular to ensure peripheral arterial disease is not a culprit. He denies symptoms of claudication, ischemic rest pain, tissue loss.   Past Medical History:  Diagnosis Date   Alcohol abuse    drinks 1 gallon of Gin every weekend, nothing during the week x >15 yrs   Atrial fibrillation (HCC)    Elevated transaminase level    +elev bili: abd u/s 06/2014 showed stable small hepatic hemangiomas, o/w normal.   Essential hypertension    GERD (gastroesophageal reflux disease)    Hyperlipidemia 03/2014   Atorv started-chol improved   Hypertension    Impaired fasting glucose 03/2014   Nephrolithiasis    PUD (peptic ulcer disease)    Stroke ([place]) 2021   Tobacco dependence    chantix: "psych effects"    Past Surgical History:  Procedure Laterality Date   COLONOSCOPY  2005   Recall 10 yrs ([place])   LOOP RECORDER INSERTION N/A 07/03/2019   Procedure: LOOP RECORDER INSERTION;  Surgeon: Thompson Grayer, MD;  Location: [facility];  Service: Cardiovascular;  Laterality: N/A;   removal of kidney stones  2006   cystoscopic--removed from ureter.  No prob since.    Social History   Socioeconomic History   Marital status: Married    Spouse name: Not on file   Number of children: 0   Years of  education: Not on file   Highest education level: Not on file  Occupational History   Not on file  Tobacco Use   Smoking status: Every Day    Packs/day: 0.50    Years: 40.00    Total pack years: 20.00    Types: Cigarettes    Start date: 67   Smokeless tobacco: Never  Vaping Use   Vaping Use: Never used  Substance and Sexual Activity   Alcohol use: Not Currently    Alcohol/week: 8.0 standard drinks of alcohol    Types: 8 Cans of beer per week    Comment: Weekends only   Drug use: No   Sexual activity: Not on file  Other Topics Concern   Not on file  Social History Narrative   Married, no children.   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   Tob: 10 pack-yr hx (current as of 03/2014).   Alcohol: 8 beers and pint of whisky on weekends only.   Denies hx of prob with drugs or alcohol.   Social Determinants of Health   Financial Resource Strain: Low Risk  (02/17/2021)   Overall Financial Resource Strain (CARDIA)    Difficulty of Paying Living Expenses: Not hard at all  Food Insecurity: No Food Insecurity (02/17/2021)   Hunger Vital Sign    Worried About Running Out  of Food in the Last Year: Never true    [place] in the Last Year: Never true  Transportation Needs: No Transportation Needs (02/17/2021)   PRAPARE - Hydrologist (Medical): No    Lack of Transportation (Non-Medical): No  Physical Activity: Inactive (02/17/2021)   Exercise Vital Sign    Days of Exercise per Week: 0 days    Minutes of Exercise per Session: 0 min  Stress: No Stress Concern Present (02/17/2021)   [place]    Feeling of Stress : Not at all  Social Connections: Moderately Integrated (02/17/2021)   Social Connection and Isolation Panel [NHANES]    Frequency of Communication with Friends and Family: More than three times a week    Frequency of Social  Gatherings with Friends and Family: More than three times a week    Attends Religious Services: More than 4 times per year    Active Member of [employer] or Organizations: No    Attends Archivist Meetings: Never    Marital Status: Married  Human resources officer Violence: Not At Risk (02/17/2021)   Humiliation, Afraid, Rape, and Kick questionnaire    Fear of Current or Ex-Partner: No    Emotionally Abused: No    Physically Abused: No    Sexually Abused: No   Family History  Problem Relation Age of Onset   Colon cancer Mother    Cancer Father    Breast cancer Sister    Breast cancer Sister    Stomach cancer Neg Hx    Esophageal cancer Neg Hx    Colon polyps Neg Hx     Current Outpatient Medications  Medication Sig Dispense Refill   acetaminophen (TYLENOL) 325 MG tablet Take 2 tablets (650 mg total) by mouth every 4 (four) hours as needed for mild pain (or temp > 37.5 C (99.5 F)). (Patient not taking: Reported on 12/27/2021)     atorvastatin (LIPITOR) 40 MG tablet TAKE 1 TABLET BY MOUTH EVERY DAY 90 tablet 1   bismuth subsalicylate (PEPTO-BISMOL) 262 MG chewable tablet Chew 2 tablets (524 mg total) by mouth 4 (four) times daily. 119 tablet 0   folic acid (FOLVITE) 1 MG tablet TAKE 1 TABLET BY MOUTH EVERY DAY 90 tablet 1   halobetasol (ULTRAVATE) 0.05 % cream Apply 1 application topically as needed (skin irritation).     metoprolol tartrate (LOPRESSOR) 25 MG tablet Take 1 tablet (25 mg total) by mouth daily. 90 tablet 2   Multiple Vitamin (MULTIVITAMIN WITH MINERALS) TABS tablet Take 1 tablet by mouth daily.     nicotine polacrilex (COMMIT) 4 MG lozenge Take 1 lozenge (4 mg total) by mouth as needed for smoking cessation. 100 tablet 0   omeprazole (PRILOSEC) 20 MG capsule Take 20 mg by mouth as needed (acid reflux).     pantoprazole (PROTONIX) 40 MG tablet Take 1 tablet (40 mg total) by mouth 2 (two) times daily. 90 tablet 3   rivaroxaban (XARELTO) 20 MG TABS tablet TAKE 1 TABLET EVERY  DAY WITH SUPPER. 90 tablet 3   No current facility-administered medications for this visit.    Allergies  Allergen Reactions   Penicillins Rash     REVIEW OF SYSTEMS:  '[X]'$  denotes positive finding, '[ ]'$  denotes negative finding Cardiac  Comments:  Chest pain or chest pressure:    Shortness of breath upon exertion:    Short of breath when lying flat:  Irregular heart rhythm:        Vascular    Pain in calf, thigh, or hip brought on by ambulation:    Pain in feet at night that wakes you up from your sleep:     Blood clot in your veins:    Leg swelling:         Pulmonary    Oxygen at home:    Productive cough:     Wheezing:         Neurologic    Sudden weakness in arms or legs:     Sudden numbness in arms or legs:     Sudden onset of difficulty speaking or slurred speech:    Temporary loss of vision in one eye:     Problems with dizziness:         Gastrointestinal    Blood in stool:     Vomited blood:         Genitourinary    Burning when urinating:     Blood in urine:        Psychiatric    Major depression:         Hematologic    Bleeding problems:    Problems with blood clotting too easily:        Skin    Rashes or ulcers:        Constitutional    Fever or chills:      PHYSICAL EXAMINATION:  There were no vitals filed for this visit.  General:  WDWN in NAD; vital signs documented above Gait: Not observed HENT: WNL, normocephalic Pulmonary: normal non-labored breathing , without wheezing Cardiac: regular HR Abdomen: soft, NT, no masses Skin: without rashes -rash on the feet but circular, appears to be ringworm Vascular Exam/Pulses:  Right Left  Radial 2+ (normal) 2+ (normal)  Ulnar    Femoral    Popliteal    DP 2+ (normal) 2+ (normal)  PT 2+ (normal) 2+ (normal)   Extremities: without ischemic changes, without Gangrene , without cellulitis; with open wounds; -serous ooze, itching, no signs of frank infection Musculoskeletal: no muscle  wasting or atrophy  Neurologic: A&O X 3;  No focal weakness or paresthesias are detected Psychiatric:  The pt has Normal affect.   Non-Invasive Vascular Imaging:   ABI Findings:  +---------+------------------+-----+-----------+--------+  Right    Rt Pressure (mmHg)IndexWaveform   Comment   +---------+------------------+-----+-----------+--------+  Brachial 157                                         +---------+------------------+-----+-----------+--------+  PTA      170               1.08 multiphasic          +---------+------------------+-----+-----------+--------+  DP       171               1.09 triphasic            +---------+------------------+-----+-----------+--------+  Great Toe136               0.87                      +---------+------------------+-----+-----------+--------+   +---------+------------------+-----+-----------+-------+  Left     Lt Pressure (mmHg)IndexWaveform   Comment  +---------+------------------+-----+-----------+-------+  Brachial 150                                        +---------+------------------+-----+-----------+-------+  PTA      160               1.02 multiphasic         +---------+------------------+-----+-----------+-------+  DP       163               1.04 triphasic           +---------+------------------+-----+-----------+-------+  Great Toe143               0.91                     +---------+------------------+-----+-----------+-------+   +-------+-----------+-----------+------------+------------+  ABI/TBIToday's ABIToday's TBIPrevious ABIPrevious TBI  +-------+-----------+-----------+------------+------------+  Right  1.09       0.87                                 +-------+-----------+-----------+------------+------------+  Left   1.04       0.91                                 +-------+-----------+-----------+------------+------------+      ASSESSMENT/PLAN: Dois Juarbe is a 71 y.o. male presenting with bilateral lower extremity foot wounds dorsal aspect of his feet accompanied by severe itching.  On exam, he had palpable pulses in the feet, and the wounds were circular with serous ooze.  ABIs were reviewed demonstrating normal perfusion bilaterally. Wounds appear to be possible ringworm.  I have prescribed some Lotrimin, but will ensure this note is forwarded to his NP Inda Coke.   Shalev can follow-up with me as needed.    Broadus John, MD [facility] [phone]

## 2021-12-29 ENCOUNTER — Encounter: Payer: Self-pay | Admitting: Vascular Surgery

## 2021-12-29 ENCOUNTER — Ambulatory Visit: Payer: Medicare HMO | Admitting: Vascular Surgery

## 2021-12-29 VITALS — BP 157/114 | HR 102 | Temp 97.7°F | Resp 20 | Ht 70.0 in | Wt 167.0 lb

## 2021-12-29 DIAGNOSIS — R21 Rash and other nonspecific skin eruption: Secondary | ICD-10-CM

## 2021-12-29 MED ORDER — CLOTRIMAZOLE 1 % EX CREA: TOPICAL_CREAM | Freq: Two times a day (BID) | CUTANEOUS | Status: DC

## 2021-12-30 ENCOUNTER — Encounter: Payer: Self-pay | Admitting: Certified Registered Nurse Anesthetist

## 2021-12-31 ENCOUNTER — Encounter: Payer: Self-pay | Admitting: Physician Assistant

## 2022-01-01 ENCOUNTER — Other Ambulatory Visit: Payer: Self-pay | Admitting: Physician Assistant

## 2022-01-01 ENCOUNTER — Telehealth: Payer: Self-pay

## 2022-01-01 MED ORDER — CLOTRIMAZOLE 1 % EX CREA: TOPICAL_CREAM | CUTANEOUS | 1 refills | Status: DC

## 2022-01-01 NOTE — Telephone Encounter (Signed)
Pt's wife, Russell Russell, called stating that she went to pickup the medication Dr Virl Cagey prescribed, but [facility] told them it was not called [facility].  [facility]ed [facility] to clarify. Stated they did not have it. Asked if they would a verbal order. Before finishing the prescription, the pharmacy staff rudely interrupted me stating they would tell the pt to get it OTC.  Called pt and spoke with Russell Russell. Informed her that the medication was OTC and if she could get it cheaper/easier without insurance, she could do that. Otherwise, give the office a call to put the order in. Confirmed understanding.

## 2022-01-02 ENCOUNTER — Encounter: Payer: Medicare HMO | Admitting: Gastroenterology

## 2022-01-05 ENCOUNTER — Encounter: Payer: Self-pay | Admitting: Physician Assistant

## 2022-01-06 ENCOUNTER — Emergency Department (HOSPITAL_BASED_OUTPATIENT_CLINIC_OR_DEPARTMENT_OTHER)
Admission: EM | Admit: 2022-01-06 | Discharge: 2022-01-06 | Disposition: A | Discharge status: Home / Self Care | Payer: Medicare HMO | Attending: Emergency Medicine | Admitting: Emergency Medicine

## 2022-01-06 ENCOUNTER — Encounter (HOSPITAL_BASED_OUTPATIENT_CLINIC_OR_DEPARTMENT_OTHER): Payer: Self-pay | Admitting: Emergency Medicine

## 2022-01-06 DIAGNOSIS — R21 Rash and other nonspecific skin eruption: Secondary | ICD-10-CM | POA: Insufficient documentation

## 2022-01-06 MED ORDER — PERMETHRIN 5 % EX CREA: TOPICAL_CREAM | CUTANEOUS | 0 refills | Status: DC

## 2022-01-06 MED ORDER — DEXAMETHASONE SODIUM PHOSPHATE 10 MG/ML IJ SOLN
10.0000 mg | Freq: Once | INTRAMUSCULAR | Status: AC
  Administered 2022-01-06: 10 mg via INTRAMUSCULAR
  Filled 2022-01-06: qty 1

## 2022-01-06 MED ORDER — LORATADINE 10 MG PO TABS: 10.0000 mg | ORAL_TABLET | Freq: Every day | ORAL | 0 refills | Status: DC

## 2022-01-06 NOTE — Discharge Instructions (Signed)
Take the medications as prescribed.  Follow-up with a dermatologist for further evaluation.

## 2022-01-06 NOTE — ED Triage Notes (Signed)
Pt reports spreading hive-like rash that started 2-3 weeks ago on his back.  Pt seen by PMD and given creams, but rash continues to itch and spread and now extends to hands and feet.

## 2022-01-06 NOTE — ED Provider Notes (Signed)
[facility] EMERGENCY DEPT Provider Note   CSN: [id number] Arrival date & time: 01/06/22  1046     History  Chief Complaint  Patient presents with   Rash    Russell Russell is a 71 y.o. male.   Rash    Patient presented to the ED for evaluation of a rash.  Patient states he has had a rash for the last 2 to 3 weeks.  He has history of eczema in the past but has not had trouble with it for many years.  States he has had severe itching that initially started on his lower extremities.  It is now spread to his upper extremities and back.  Patient states he saw 1 doctor who suggested it could be ringworm.  Patient was given a prescription for Lotrimin cream.  He has not noticed any improvement  Patient is itching all over so he came to the ED for further evaluation. Home Medications Prior to Admission medications   Medication Sig Start Date End Date Taking? Authorizing Provider  loratadine (CLARITIN) 10 MG tablet Take 1 tablet (10 mg total) by mouth daily. 01/06/22  Yes Dorie Rank, MD  permethrin (ELIMITE) 5 % cream Applying from your neck down in the evening.  Wash off in the morning 01/06/22  Yes Dorie Rank, MD  acetaminophen (TYLENOL) 325 MG tablet Take 2 tablets (650 mg total) by mouth every 4 (four) hours as needed for mild pain (or temp > 37.5 C (99.5 F)). Patient not taking: Reported on 12/29/2021 08/04/19   Cathlyn Parsons, PA-C  atorvastatin (LIPITOR) 40 MG tablet TAKE 1 TABLET BY MOUTH EVERY DAY 06/12/21   Inda Coke, PA  bismuth subsalicylate (PEPTO-BISMOL) 262 MG chewable tablet Chew 2 tablets (524 mg total) by mouth 4 (four) times daily. 11/03/21   Cirigliano, Vito V, DO  clotrimazole (LOTRIMIN) 1 % cream Apply to affected area 2 times daily 01/01/22   Inda Coke, PA  folic acid (FOLVITE) 1 MG tablet TAKE 1 TABLET BY MOUTH EVERY DAY 09/22/21   Inda Coke, PA  halobetasol (ULTRAVATE) 0.05 % cream Apply 1 application topically as needed (skin  irritation).    [provider]  metoprolol tartrate (LOPRESSOR) 25 MG tablet Take 1 tablet (25 mg total) by mouth daily. 08/21/21   Shirley Friar, PA-C  Multiple Vitamin (MULTIVITAMIN WITH MINERALS) TABS tablet Take 1 tablet by mouth daily. 08/04/19   Angiulli, Lavon Paganini, PA-C  nicotine polacrilex (COMMIT) 4 MG lozenge Take 1 lozenge (4 mg total) by mouth as needed for smoking cessation. 12/26/21   Inda Coke, PA  omeprazole (PRILOSEC) 20 MG capsule Take 20 mg by mouth as needed (acid reflux).    [provider]  pantoprazole (PROTONIX) 40 MG tablet Take 1 tablet (40 mg total) by mouth 2 (two) times daily. 10/27/21   Cirigliano, Vito V, DO  rivaroxaban (XARELTO) 20 MG TABS tablet TAKE 1 TABLET EVERY DAY WITH SUPPER. 12/04/21   Jettie Booze, MD      Allergies    Penicillins    Review of Systems   Review of Systems  Skin:  Positive for rash.    Physical Exam Updated Vital Signs BP (!) 161/108 (BP Location: Right Arm)   Pulse 95   Temp 97.8 F (36.6 C) (Oral)   Resp 18   SpO2 100%  Physical Exam Vitals and nursing note reviewed.  Constitutional:      General: He is not in acute distress.    Appearance: He  is well-developed.  HENT:     Head: Normocephalic and atraumatic.     Right Ear: External ear normal.     Left Ear: External ear normal.  Eyes:     General: No scleral icterus.       Right eye: No discharge.        Left eye: No discharge.     Conjunctiva/sclera: Conjunctivae normal.  Neck:     Trachea: No tracheal deviation.  Cardiovascular:     Rate and Rhythm: Normal rate.  Pulmonary:     Effort: Pulmonary effort is normal. No respiratory distress.     Breath sounds: No stridor.  Abdominal:     General: There is no distension.  Musculoskeletal:        General: No swelling or deformity.     Cervical back: Neck supple.  Skin:    General: Skin is warm and dry.     Findings: Rash present.     Comments: Macular papular with areas  of excoriation, no vesicles or pustules, involves feet extremities, torso, no palms and soles, no urticaria  Neurological:     Mental Status: He is alert.     Cranial Nerves: Cranial nerve deficit: no gross deficits.     ED Results / Procedures / Treatments   Labs (all labs ordered are listed, but only abnormal results are displayed) Labs Reviewed - No data to display  EKG None  Radiology No results found.  Procedures Procedures    Medications Ordered in ED Medications  dexamethasone (DECADRON) injection 10 mg (10 mg Intramuscular Given 01/06/22 1252)    ED Course/ Medical Decision Making/ A&P                           Medical Decision Making Risk Prescription drug management.   She has a pruritic rash.  Atypical presentation for eczema.  Could be fungal infection with ID reaction.  Patient was given dose of steroids.  Will add antihistamines.  Continue with the Lotrimin.  Atypical presentation for scabies but will consider a course of Elimite        Final Clinical Impression(s) / ED Diagnoses Final diagnoses:  Rash    Rx / DC Orders ED Discharge Orders          Ordered    loratadine (CLARITIN) 10 MG tablet  Daily        01/06/22 1308    permethrin (ELIMITE) 5 % cream        01/06/22 1308              Dorie Rank, MD 01/06/22 1308

## 2022-01-08 ENCOUNTER — Encounter: Payer: Self-pay | Admitting: Gastroenterology

## 2022-01-08 ENCOUNTER — Ambulatory Visit (AMBULATORY_SURGERY_CENTER): Payer: Medicare HMO | Admitting: Gastroenterology

## 2022-01-08 VITALS — BP 143/102 | HR 72 | Temp 97.9°F | Resp 18 | Ht 70.0 in | Wt 167.0 lb

## 2022-01-08 DIAGNOSIS — K449 Diaphragmatic hernia without obstruction or gangrene: Secondary | ICD-10-CM

## 2022-01-08 DIAGNOSIS — K2970 Gastritis, unspecified, without bleeding: Secondary | ICD-10-CM

## 2022-01-08 DIAGNOSIS — A048 Other specified bacterial intestinal infections: Secondary | ICD-10-CM

## 2022-01-08 DIAGNOSIS — K269 Duodenal ulcer, unspecified as acute or chronic, without hemorrhage or perforation: Secondary | ICD-10-CM

## 2022-01-08 DIAGNOSIS — K2980 Duodenitis without bleeding: Secondary | ICD-10-CM | POA: Diagnosis not present

## 2022-01-08 MED ORDER — SODIUM CHLORIDE 0.9 % IV SOLN: 500.0000 mL | INTRAVENOUS | Status: DC

## 2022-01-08 MED ORDER — PANTOPRAZOLE SODIUM 40 MG PO TBEC: 40.0000 mg | DELAYED_RELEASE_TABLET | Freq: Every day | ORAL | 1 refills | Status: DC

## 2022-01-08 NOTE — Patient Instructions (Signed)
Please read handouts provided. Continue present medications. Await pathology results. Resume Xarelto ( rivaroxaban ) at prior dose tomorrow. Use Protonix ( pantoprazole ) 40 mg daily for 4 weeks, then stop if no further need. Return to GI office as needed.  YOU HAD AN ENDOSCOPIC PROCEDURE TODAY AT [facility]:   Refer to the procedure report that was given to you for any specific questions about what was found during the examination.  If the procedure report does not answer your questions, please call your gastroenterologist to clarify.  If you requested that your care partner not be given the details of your procedure findings, then the procedure report has been included in a sealed envelope for you to review at your convenience later.  YOU SHOULD EXPECT: Some feelings of bloating in the abdomen. Passage of more gas than usual.  Walking can help get rid of the air that was put into your GI tract during the procedure and reduce the bloating. If you had a lower endoscopy (such as a colonoscopy or flexible sigmoidoscopy) you may notice spotting of blood in your stool or on the toilet paper. If you underwent a bowel prep for your procedure, you may not have a normal bowel movement for a few days.  Please Note:  You might notice some irritation and congestion in your nose or some drainage.  This is from the oxygen used during your procedure.  There is no need for concern and it should clear up in a day or so.  SYMPTOMS TO REPORT IMMEDIATELY:   Following upper endoscopy (EGD)  Vomiting of blood or coffee ground material  New chest pain or pain under the shoulder blades  Painful or persistently difficult swallowing  New shortness of breath  Fever of 100F or higher  Black, tarry-looking stools  For urgent or emergent issues, a gastroenterologist can be reached at any hour by calling [phone]. Do not use MyChart messaging for urgent concerns.    DIET:  We do recommend a  small meal at first, but then you may proceed to your regular diet.  Drink plenty of fluids but you should avoid alcoholic beverages for 24 hours.  ACTIVITY:  You should plan to take it easy for the rest of today and you should NOT DRIVE or use heavy machinery until tomorrow (because of the sedation medicines used during the test).    FOLLOW UP: Our staff will call the number listed on your records the next business day following your procedure.  We will call around 7:15- 8:00 am to check on you and address any questions or concerns that you may have regarding the information given to you following your procedure. If we do not reach you, we will leave a message.     If any biopsies were taken you will be contacted by phone or by letter within the next 1-3 weeks.  Please call us at [phone] if you have not heard about the biopsies in 3 weeks.    SIGNATURES/CONFIDENTIALITY: You and/or your care partner have signed paperwork which will be entered into your electronic medical record.  These signatures attest to the fact that that the information above on your After Visit Summary has been reviewed and is understood.  Full responsibility of the confidentiality of this discharge information lies with you and/or your care-partner.

## 2022-01-08 NOTE — Op Note (Signed)
Russell Russell Patient Name: Russell Russell Procedure Date: 01/08/2022 9:59 AM MRN: [id number] Endoscopist: Gerrit Heck , MD, [id number] Age: 71 Referring MD:  Date of Birth: 1951-03-11 Gender: Male Account #: [id number] Procedure:                Upper GI endoscopy with biopsy Indications:              Follow-up of Helicobacter pylori, Follow-up of                            gastric ulcer, Follow-up of duodenal ulcer                           Previous EGD on 10/27/2021 notable for                            nonobstructing Schatzki's ring, 4 cm hiatal hernia,                            moderate gastritis with antral erosions (path: H.                            pylori positive), moderate duodenitis with duodenal                            ulcers, duodenal nodules. Was treated with                            high-dose Protonix 40 mg twice daily, Pepto-Bismol,                            metronidazole, doxycycline (quadruple therapy),                            along with Carafate, and returns today for repeat                            endoscopy to evaluate for appropriate healing and                            repeat biopsy as appropriate.                           He completed quadruple therapy without issue, and                            currently still on Protonix 40 mg twice daily.                            Since completing therapy, states his appetite has                            improved, nausea/vomiting resolved, and starting to  gain weight again. Medicines:                Monitored Anesthesia Care Procedure:                Pre-Anesthesia Assessment:                           - Prior to the procedure, a History and Physical                            was performed, and patient medications and                            allergies were reviewed. The patient's tolerance of                            previous anesthesia was also reviewed.  The risks                            and benefits of the procedure and the sedation                            options and risks were discussed with the patient.                            All questions were answered, and informed consent                            was obtained. Prior Anticoagulants: The patient has                            taken Xarelto (rivaroxaban), last dose was 2 days                            prior to procedure. ASA Grade Assessment: III - A                            patient with severe systemic disease. After                            reviewing the risks and benefits, the patient was                            deemed in satisfactory condition to undergo the                            procedure.                           After obtaining informed consent, the endoscope was                            passed under direct vision. Throughout the  procedure, the patient's blood pressure, pulse, and                            oxygen saturations were monitored continuously. The                            Endoscope was introduced through the mouth, and                            advanced to the third part of duodenum. The upper                            GI endoscopy was accomplished without difficulty.                            The patient tolerated the procedure well. Scope In: Scope Out: Findings:                 A 3 cm sliding type hiatal hernia was present.                           The upper third of the esophagus, middle third of                            the esophagus and lower third of the esophagus were                            normal.                           The entire examined stomach was normal. The                            previously noted gastritis and antral erosions have                            since healed. Biopsies were taken with a cold                            forceps for Helicobacter pylori testing. Estimated                             blood loss was minimal.                           A few localized erosions without bleeding were                            found in the duodenal bulb. This was significantly                            improved compared with the previous study. Biopsies  were taken with a cold forceps for histology.                            Estimated blood loss was minimal.                           The second portion of the duodenum and third                            portion of the duodenum were normal. Complications:            No immediate complications. Estimated Blood Loss:     Estimated blood loss was minimal. Impression:               - 3 cm hiatal hernia.                           - Normal upper third of esophagus, middle third of                            esophagus and lower third of esophagus.                           - Normal stomach. Biopsied.                           - Duodenal erosions without bleeding. Biopsied.                           - Normal second portion of the duodenum and third                            portion of the duodenum. Recommendation:           - Patient has a contact number available for                            emergencies. The signs and symptoms of potential                            delayed complications were discussed with the                            patient. Return to normal activities tomorrow.                            Written discharge instructions were provided to the                            patient.                           - Resume previous diet.                           - Continue present medications.                           -  Await pathology results.                           - Resume Xarelto (rivaroxaban) at prior dose                            tomorrow.                           - Use Protonix (pantoprazole) 40 mg PO daily for 4                            weeks, then reduce to the  lowest effective dose or                            stop completely if no further need for acid                            suppression therapy (ie, no esophageal reflux).                           - Return to GI clinic PRN. Gerrit Heck, MD 01/08/2022 10:20:22 AM

## 2022-01-08 NOTE — Progress Notes (Signed)
0957 Robinul 0.1 mg IV given due large amount of secretions upon assessment.  MD made aware, vss

## 2022-01-08 NOTE — Progress Notes (Signed)
Pt's states no medical or surgical changes since previsit or office visit. 

## 2022-01-08 NOTE — Progress Notes (Signed)
GASTROENTEROLOGY PROCEDURE H&P NOTE   Primary Care Physician: Inda Coke, PA    Reason for Procedure:  Helicobacter pylori gastritis, duodenitis, gastric/duodenal ulcers, duodenal nodules, hiatal hernia  Plan:    EGD  Patient is appropriate for endoscopic procedure(s) in the ambulatory ([facility]) setting.  The nature of the procedure, as well as the risks, benefits, and alternatives were carefully and thoroughly reviewed with the patient. Ample time for discussion and questions allowed. The patient understood, was satisfied, and agreed to proceed.     HPI: Russell Russell is a 71 y.o. male who presents for EGD for follow-up.  Previous EGD on 8/18 such as in 23 notable for nonobstructing Schatzki's ring, 4 cm hiatal hernia, moderate gastritis with antral erosions (path: H. pylori positive), moderate duodenitis with duodenal ulcers, duodenal nodules.  Was treated with high-dose Protonix 40 mg twice daily, Carafate, Pepto-Bismol, metronidazole, doxycycline (quadruple therapy) and returns today for repeat endoscopy to evaluate for appropriate healing and repeat biopsy as appropriate.  Past Medical History:  Diagnosis Date   Alcohol abuse    drinks 1 gallon of Gin every weekend, nothing during the week x >15 yrs   Atrial fibrillation (HCC)    Elevated transaminase level    +elev bili: abd u/s 06/2014 showed stable small hepatic hemangiomas, o/w normal.   Essential hypertension    GERD (gastroesophageal reflux disease)    Hyperlipidemia 03/2014   Atorv started-chol improved   Hypertension    Impaired fasting glucose 03/2014   Nephrolithiasis    PUD (peptic ulcer disease)    Stroke ([place]) 2021   Tobacco dependence    chantix: "psych effects"    Past Surgical History:  Procedure Laterality Date   COLONOSCOPY  2005   Recall 10 yrs ([place])   LOOP RECORDER INSERTION N/A 07/03/2019   Procedure: LOOP RECORDER INSERTION;  Surgeon: Thompson Grayer, MD;  Location: [facility];  Service: Cardiovascular;  Laterality: N/A;   removal of kidney stones  2006   cystoscopic--removed from ureter.  No prob since.    Prior to Admission medications   Medication Sig Start Date End Date Taking? Authorizing Provider  atorvastatin (LIPITOR) 40 MG tablet TAKE 1 TABLET BY MOUTH EVERY DAY 06/12/21  Yes Inda Coke, PA  clotrimazole (LOTRIMIN) 1 % cream Apply to affected area 2 times daily 01/01/22  Yes Worley, Campbell, PA  folic acid (FOLVITE) 1 MG tablet TAKE 1 TABLET BY MOUTH EVERY DAY 09/22/21  Yes Inda Coke, PA  halobetasol (ULTRAVATE) 0.05 % cream Apply 1 application topically as needed (skin irritation).   Yes [provider]  loratadine (CLARITIN) 10 MG tablet Take 1 tablet (10 mg total) by mouth daily. 01/06/22  Yes Dorie Rank, MD  metoprolol tartrate (LOPRESSOR) 25 MG tablet Take 1 tablet (25 mg total) by mouth daily. 08/21/21  Yes Shirley Friar, PA-C  Multiple Vitamin (MULTIVITAMIN WITH MINERALS) TABS tablet Take 1 tablet by mouth daily. 08/04/19  Yes Angiulli, Lavon Paganini, PA-C  pantoprazole (PROTONIX) 40 MG tablet Take 1 tablet (40 mg total) by mouth 2 (two) times daily. 10/27/21  Yes Kathee Tumlin V, DO  acetaminophen (TYLENOL) 325 MG tablet Take 2 tablets (650 mg total) by mouth every 4 (four) hours as needed for mild pain (or temp > 37.5 C (99.5 F)). Patient not taking: Reported on 12/29/2021 08/04/19   Angiulli, Lavon Paganini, PA-C  bismuth subsalicylate (PEPTO-BISMOL) 262 MG chewable tablet Chew 2 tablets (524 mg total) by mouth 4 (four) times daily.  11/03/21   Lyla Jasek V, DO  nicotine polacrilex (COMMIT) 4 MG lozenge Take 1 lozenge (4 mg total) by mouth as needed for smoking cessation. Patient not taking: Reported on 01/08/2022 12/26/21   Inda Coke, PA  omeprazole (PRILOSEC) 20 MG capsule Take 20 mg by mouth as needed (acid reflux). Patient not taking: Reported on 01/08/2022    [provider]  permethrin (ELIMITE) 5 %  cream Applying from your neck down in the evening.  Wash off in the morning Patient not taking: Reported on 01/08/2022 01/06/22   Dorie Rank, MD  rivaroxaban (XARELTO) 20 MG TABS tablet TAKE 1 TABLET EVERY DAY WITH SUPPER. 12/04/21   Jettie Booze, MD    Current Outpatient Medications  Medication Sig Dispense Refill   atorvastatin (LIPITOR) 40 MG tablet TAKE 1 TABLET BY MOUTH EVERY DAY 90 tablet 1   clotrimazole (LOTRIMIN) 1 % cream Apply to affected area 2 times daily 60 g 1   folic acid (FOLVITE) 1 MG tablet TAKE 1 TABLET BY MOUTH EVERY DAY 90 tablet 1   halobetasol (ULTRAVATE) 0.05 % cream Apply 1 application topically as needed (skin irritation).     loratadine (CLARITIN) 10 MG tablet Take 1 tablet (10 mg total) by mouth daily. 10 tablet 0   metoprolol tartrate (LOPRESSOR) 25 MG tablet Take 1 tablet (25 mg total) by mouth daily. 90 tablet 2   Multiple Vitamin (MULTIVITAMIN WITH MINERALS) TABS tablet Take 1 tablet by mouth daily.     pantoprazole (PROTONIX) 40 MG tablet Take 1 tablet (40 mg total) by mouth 2 (two) times daily. 90 tablet 3   acetaminophen (TYLENOL) 325 MG tablet Take 2 tablets (650 mg total) by mouth every 4 (four) hours as needed for mild pain (or temp > 37.5 C (99.5 F)). (Patient not taking: Reported on 12/29/2021)     bismuth subsalicylate (PEPTO-BISMOL) 262 MG chewable tablet Chew 2 tablets (524 mg total) by mouth 4 (four) times daily. 112 tablet 0   nicotine polacrilex (COMMIT) 4 MG lozenge Take 1 lozenge (4 mg total) by mouth as needed for smoking cessation. (Patient not taking: Reported on 01/08/2022) 100 tablet 0   omeprazole (PRILOSEC) 20 MG capsule Take 20 mg by mouth as needed (acid reflux). (Patient not taking: Reported on 01/08/2022)     permethrin (ELIMITE) 5 % cream Applying from your neck down in the evening.  Wash off in the morning (Patient not taking: Reported on 01/08/2022) 60 g 0   rivaroxaban (XARELTO) 20 MG TABS tablet TAKE 1 TABLET EVERY DAY WITH  SUPPER. 90 tablet 3   Current Facility-Administered Medications  Medication Dose Route Frequency Provider Last Rate Last Admin   0.9 %  sodium chloride infusion  500 mL Intravenous Continuous Dishon Kehoe V, DO       clotrimazole (LOTRIMIN) 1 % cream   Topical BID Broadus John, MD        Allergies as of 01/08/2022 - Review Complete 01/08/2022  Allergen Reaction Noted   Penicillins Rash 03/18/2014    Family History  Problem Relation Age of Onset   Colon cancer Mother    Cancer Father    Breast cancer Sister    Breast cancer Sister    Stomach cancer Neg Hx    Esophageal cancer Neg Hx    Colon polyps Neg Hx     Social History   Socioeconomic History   Marital status: Married    Spouse name: Not on file   Number of  children: 0   Years of education: Not on file   Highest education level: Not on file  Occupational History   Not on file  Tobacco Use   Smoking status: Every Day    Packs/day: 1.00    Years: 40.00    Total pack years: 40.00    Types: Cigarettes    Start date: 38   Smokeless tobacco: Never  Vaping Use   Vaping Use: Never used  Substance and Sexual Activity   Alcohol use: Not Currently    Alcohol/week: 8.0 standard drinks of alcohol    Types: 8 Cans of beer per week    Comment: Weekends only   Drug use: No   Sexual activity: Not on file  Other Topics Concern   Not on file  Social History Narrative   Married, no children.   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   Tob: 10 pack-yr hx (current as of 03/2014).   Alcohol: 8 beers and pint of whisky on weekends only.   Denies hx of prob with drugs or alcohol.   Social Determinants of Health   Financial Resource Strain: Low Risk  (02/17/2021)   Overall Financial Resource Strain (CARDIA)    Difficulty of Paying Living Expenses: Not hard at all  Food Insecurity: No Food Insecurity (02/17/2021)   Hunger Vital Sign    Worried About Running Out of Food in  the Last Year: Never true    Ran Out of Food in the Last Year: Never true  Transportation Needs: No Transportation Needs (02/17/2021)   PRAPARE - Hydrologist (Medical): No    Lack of Transportation (Non-Medical): No  Physical Activity: Inactive (02/17/2021)   Exercise Vital Sign    Days of Exercise per Week: 0 days    Minutes of Exercise per Session: 0 min  Stress: No Stress Concern Present (02/17/2021)   [place]    Feeling of Stress : Not at all  Social Connections: Moderately Integrated (02/17/2021)   Social Connection and Isolation Panel [NHANES]    Frequency of Communication with Friends and Family: More than three times a week    Frequency of Social Gatherings with Friends and Family: More than three times a week    Attends Religious Services: More than 4 times per year    Active Member of [employer] or Organizations: No    Attends Archivist Meetings: Never    Marital Status: Married  Human resources officer Violence: Not At Risk (02/17/2021)   Humiliation, Afraid, Rape, and Kick questionnaire    Fear of Current or Ex-Partner: No    Emotionally Abused: No    Physically Abused: No    Sexually Abused: No    Physical Exam: Vital signs in last 24 hours: '@BP'$  (!) 150/99   Pulse 91   Temp 97.9 F (36.6 C)   Ht '5\' 10"'$  (1.778 m)   Wt 167 lb (75.8 kg)   SpO2 100%   BMI 23.96 kg/m  GEN: NAD EYE: Sclerae anicteric ENT: MMM CV: Non-tachycardic Pulm: CTA b/l GI: Soft, NT/ND NEURO:  Alert & Oriented x 3   Gerrit Heck, DO [facility]   01/08/2022 9:56 AM

## 2022-01-08 NOTE — Progress Notes (Signed)
Report given to PACU, vss 

## 2022-01-08 NOTE — Progress Notes (Signed)
Called to room to assist during endoscopic procedure.  Patient ID and intended procedure confirmed with present staff. Received instructions for my participation in the procedure from the performing physician.  

## 2022-01-09 ENCOUNTER — Telehealth: Payer: Self-pay | Admitting: *Deleted

## 2022-01-09 NOTE — Telephone Encounter (Signed)
  Follow up Call-     01/08/2022    9:15 AM 10/27/2021    8:52 AM  Call back number  Post procedure Call Back phone  # [phone] wifes number  Permission to leave phone message Yes Yes     Patient questions:  Do you have a fever, pain , or abdominal swelling? No. Pain Score  0 *  Have you tolerated food without any problems? Yes.    Have you been able to return to your normal activities? Yes.    Do you have any questions about your discharge instructions: Diet   No. Medications  No. Follow up visit  No.  Do you have questions or concerns about your Care? No.  Actions: * If pain score is 4 or above: No action needed, pain <4.

## 2022-01-10 ENCOUNTER — Encounter: Payer: Self-pay | Admitting: Gastroenterology

## 2022-01-12 ENCOUNTER — Encounter: Payer: Self-pay | Admitting: Physician Assistant

## 2022-01-12 ENCOUNTER — Ambulatory Visit (INDEPENDENT_AMBULATORY_CARE_PROVIDER_SITE_OTHER): Payer: Medicare HMO | Admitting: Physician Assistant

## 2022-01-12 VITALS — BP 160/90 | HR 91 | Temp 98.4°F | Ht 70.0 in | Wt 170.2 lb

## 2022-01-12 DIAGNOSIS — L299 Pruritus, unspecified: Secondary | ICD-10-CM

## 2022-01-12 MED ORDER — TRIAMCINOLONE ACETONIDE 0.1 % EX CREA: TOPICAL_CREAM | CUTANEOUS | 0 refills | Status: DC

## 2022-01-12 MED ORDER — PREDNISONE 10 MG PO TABS: ORAL_TABLET | ORAL | 0 refills | Status: DC

## 2022-01-12 NOTE — Patient Instructions (Signed)
It was great to see you!  Start oral prednisone that I am sending in for you -- take daily for two weeks May apply the triamcinolone (steroid cream) to any really itchy areas Continue daily claritin May take benadryl if needed  Consider Mite Removal cream for this -- you can purchase over the counter. Apply to area once and follow instructions.    If no relief, please come back and see me.  Take care,  Inda Coke PA-C

## 2022-01-12 NOTE — Progress Notes (Signed)
Russell Russell is a 71 y.o. male here for a follow up of a pre-existing problem.  History of Present Illness:   Chief Complaint  Patient presents with   Rash    Pt was seen in the ED on 10/28 was given a shot and      Rash    Rash Symptoms have been going on for 3-4 weeks now.  Started on feet and now on back and b/l arms and hands. Hx of eczema -- has used halobetasol in the past, ran out. Went to ER on 10/28 and was given steroid injection which was helpful. Was also recommended to trial permethrin but this was too expensive so this was  not picked up. Also taking claritin daily. Denies any new environmental exposures or new medications that are consistent with this.  Past Medical History:  Diagnosis Date   Alcohol abuse    drinks 1 gallon of Gin every weekend, nothing during the week x >15 yrs   Atrial fibrillation (HCC)    Elevated transaminase level    +elev bili: abd u/s 06/2014 showed stable small hepatic hemangiomas, o/w normal.   Essential hypertension    GERD (gastroesophageal reflux disease)    Hyperlipidemia 03/2014   Atorv started-chol improved   Hypertension    Impaired fasting glucose 03/2014   Nephrolithiasis    PUD (peptic ulcer disease)    Stroke ([place]) 2021   Tobacco dependence    chantix: "psych effects"     Social History   Tobacco Use   Smoking status: Every Day    Packs/day: 1.00    Years: 40.00    Total pack years: 40.00    Types: Cigarettes    Start date: 1974   Smokeless tobacco: Never  Vaping Use   Vaping Use: Never used  Substance Use Topics   Alcohol use: Not Currently    Alcohol/week: 8.0 standard drinks of alcohol    Types: 8 Cans of beer per week    Comment: Weekends only   Drug use: No    Past Surgical History:  Procedure Laterality Date   COLONOSCOPY  2005   Recall 10 yrs ([place])   LOOP RECORDER INSERTION N/A 07/03/2019   Procedure: LOOP RECORDER INSERTION;  Surgeon: Thompson Grayer, MD;  Location: [facility];  Service: Cardiovascular;  Laterality: N/A;   removal of kidney stones  2006   cystoscopic--removed from ureter.  No prob since.    Family History  Problem Relation Age of Onset   Colon cancer Mother    Cancer Father    Breast cancer Sister    Breast cancer Sister    Stomach cancer Neg Hx    Esophageal cancer Neg Hx    Colon polyps Neg Hx     Allergies  Allergen Reactions   Penicillins Rash    Current Medications:   Current Outpatient Medications:    acetaminophen (TYLENOL) 325 MG tablet, Take 2 tablets (650 mg total) by mouth every 4 (four) hours as needed for mild pain (or temp > 37.5 C (99.5 F))., Disp:  , Rfl:    atorvastatin (LIPITOR) 40 MG tablet, TAKE 1 TABLET BY MOUTH EVERY DAY, Disp: 90 tablet, Rfl: 1   clotrimazole (LOTRIMIN) 1 % cream, Apply to affected area 2 times daily, Disp: 60 g, Rfl: 1   folic acid (FOLVITE) 1 MG tablet, TAKE 1 TABLET BY MOUTH EVERY DAY, Disp: 90 tablet, Rfl: 1   halobetasol (ULTRAVATE) 0.05 % cream, Apply 1 application  topically as needed (skin irritation)., Disp: , Rfl:    loratadine (CLARITIN) 10 MG tablet, Take 1 tablet (10 mg total) by mouth daily., Disp: 10 tablet, Rfl: 0   metoprolol tartrate (LOPRESSOR) 25 MG tablet, Take 1 tablet (25 mg total) by mouth daily., Disp: 90 tablet, Rfl: 2   Multiple Vitamin (MULTIVITAMIN WITH MINERALS) TABS tablet, Take 1 tablet by mouth daily., Disp:  , Rfl:    omeprazole (PRILOSEC) 20 MG capsule, Take 20 mg by mouth as needed (acid reflux)., Disp: , Rfl:    pantoprazole (PROTONIX) 40 MG tablet, Take 1 tablet (40 mg total) by mouth daily. Protonix 40 mg daily for 4 weeks., Disp: 30 tablet, Rfl: 1   predniSONE (DELTASONE) 10 MG tablet, Take two tablets daily x 1 week, then one tablet daily x 1 week., Disp: 21 tablet, Rfl: 0   rivaroxaban (XARELTO) 20 MG TABS tablet, TAKE 1 TABLET EVERY DAY WITH SUPPER., Disp: 90 tablet, Rfl: 3   triamcinolone cream (KENALOG) 0.1 %, Apply to affected 1-2 times daily, Disp:  30 g, Rfl: 0   nicotine polacrilex (COMMIT) 4 MG lozenge, Take 1 lozenge (4 mg total) by mouth as needed for smoking cessation. (Patient not taking: Reported on 01/08/2022), Disp: 100 tablet, Rfl: 0   permethrin (ELIMITE) 5 % cream, Applying from your neck down in the evening.  Wash off in the morning (Patient not taking: Reported on 01/08/2022), Disp: 60 g, Rfl: 0  Current Facility-Administered Medications:    clotrimazole (LOTRIMIN) 1 % cream, , Topical, BID, Virl Cagey Carolann Littler, MD   Review of Systems:   Review of Systems  Skin:  Positive for rash.   Negative unless otherwise specified per HPI.  Vitals:   Vitals:   01/12/22 1029  BP: (!) 160/90  Pulse: 91  Temp: 98.4 F (36.9 C)  TempSrc: Temporal  SpO2: 99%  Weight: 170 lb 4 oz (77.2 kg)  Height: '5\' 10"'$  (1.778 m)     Body mass index is 24.43 kg/m.  Physical Exam:   Physical Exam Vitals and nursing note reviewed.  Constitutional:      Appearance: He is well-developed.  HENT:     Head: Normocephalic.  Eyes:     Conjunctiva/sclera: Conjunctivae normal.     Pupils: Pupils are equal, round, and reactive to light.  Pulmonary:     Effort: Pulmonary effort is normal.  Musculoskeletal:        General: Normal range of motion.     Cervical back: Normal range of motion.  Skin:    General: Skin is warm and dry.     Comments: Significant excoriations and numerous raised flesh-colored lesions to bilateral upper arms R palm of hand with raised flesh-colored lesions  Neurological:     Mental Status: He is alert and oriented to person, place, and time.  Psychiatric:        Behavior: Behavior normal.        Thought Content: Thought content normal.        Judgment: Judgment normal.     Assessment and Plan:   Pruritus Unclear etiology Possible eczema -- will trial oral prednisone x 2 weeks Topical triamcinolone cream if needed to concentrated areas Continue claritin Consider benadryl Consider OTC permethrin cream   Check blood work to assess for organic cause Follow-up if lack of improvement, will consider biopsy    Inda Coke, PA-C

## 2022-01-12 NOTE — Addendum Note (Signed)
Addended by: Doran Clay A on: 01/12/2022 12:22 PM   Modules accepted: Orders

## 2022-01-15 ENCOUNTER — Ambulatory Visit (INDEPENDENT_AMBULATORY_CARE_PROVIDER_SITE_OTHER): Payer: Medicare HMO

## 2022-01-15 DIAGNOSIS — I6381 Other cerebral infarction due to occlusion or stenosis of small artery: Secondary | ICD-10-CM | POA: Diagnosis not present

## 2022-01-16 LAB — CUP PACEART REMOTE DEVICE CHECK
Date Time Interrogation Session: 20231105231855
Implantable Pulse Generator Implant Date: 20210423

## 2022-01-17 ENCOUNTER — Ambulatory Visit: Payer: Medicare HMO | Attending: Student | Admitting: Student

## 2022-01-17 ENCOUNTER — Encounter: Payer: Self-pay | Admitting: Student

## 2022-01-17 VITALS — BP 138/96 | HR 91 | Ht 70.0 in | Wt 173.0 lb

## 2022-01-17 DIAGNOSIS — I639 Cerebral infarction, unspecified: Secondary | ICD-10-CM | POA: Diagnosis not present

## 2022-01-17 DIAGNOSIS — I10 Essential (primary) hypertension: Secondary | ICD-10-CM | POA: Diagnosis not present

## 2022-01-17 DIAGNOSIS — I480 Paroxysmal atrial fibrillation: Secondary | ICD-10-CM

## 2022-01-17 MED ORDER — METOPROLOL SUCCINATE ER 25 MG PO TB24: 25.0000 mg | ORAL_TABLET | Freq: Every day | ORAL | 3 refills | Status: DC

## 2022-01-17 NOTE — Patient Instructions (Signed)
Medication Instructions:  Your physician has recommended you make the following change in your medication:   DISCONTINUE: Metoprolol Tartrate START: Metoprolol Succinate '25mg'$  daily at bedtime  *If you need a refill on your cardiac medications before your next appointment, please call your pharmacy*   Lab Work: None If you have labs (blood work) drawn today and your tests are completely normal, you will receive your results only by: [place] (if you have MyChart) OR A paper copy in the mail If you have any lab test that is abnormal or we need to change your treatment, we will call you to review the results.   Follow-Up: At [facility], you and your health needs are our priority.  As part of our continuing mission to provide you with exceptional heart care, we have created designated Provider Care Teams.  These Care Teams include your primary Cardiologist (physician) and Advanced Practice Providers (APPs -  Physician Assistants and Nurse Practitioners) who all work together to provide you with the care you need, when you need it.  Your next appointment:   1 year(s)  The format for your next appointment:   In Person  Provider:   Doralee Albino, MD     Important Information About Sugar

## 2022-01-17 NOTE — Progress Notes (Signed)
Electrophysiology Office Note Date: 01/17/2022  ID:  Russell Russell, DOB 1951/02/23, MRN [id number]  PCP: Inda Coke, PA Primary Cardiologist: None Electrophysiologist: Dr. Rayann Heman -> Russell Quitter, MD   CC: ILR follow-up  Russell Russell is a 71 y.o. male seen today for Dr. Myles Gip . he presents today for routine electrophysiology followup. Since last being seen in our clinic the patient reports doing well overall. He does get diarrhea from time to time, and wonders if its from his Xarelto.  he denies chest pain, palpitations, dyspnea, PND, orthopnea, nausea, vomiting, dizziness, syncope, edema, weight gain, or early satiety. .  Device History: Medtronic loop recorder implanted 2021 for Cryptogenic Stroke   Past Medical History:  Diagnosis Date   Alcohol abuse    drinks 1 gallon of Gin every weekend, nothing during the week x >15 yrs   Atrial fibrillation (HCC)    Elevated transaminase level    +elev bili: abd u/s 06/2014 showed stable small hepatic hemangiomas, o/w normal.   Essential hypertension    GERD (gastroesophageal reflux disease)    Hyperlipidemia 03/2014   Atorv started-chol improved   Hypertension    Impaired fasting glucose 03/2014   Nephrolithiasis    PUD (peptic ulcer disease)    Stroke ([place]) 2021   Tobacco dependence    chantix: "psych effects"   Past Surgical History:  Procedure Laterality Date   COLONOSCOPY  2005   Recall 10 yrs ([place])   LOOP RECORDER INSERTION N/A 07/03/2019   Procedure: LOOP RECORDER INSERTION;  Surgeon: Thompson Grayer, MD;  Location: [facility];  Service: Cardiovascular;  Laterality: N/A;   removal of kidney stones  2006   cystoscopic--removed from ureter.  No prob since.    Current Outpatient Medications  Medication Sig Dispense Refill   acetaminophen (TYLENOL) 325 MG tablet Take 2 tablets (650 mg total) by mouth every 4 (four) hours as needed for mild pain (or temp > 37.5 C (99.5 F)).     atorvastatin  (LIPITOR) 40 MG tablet TAKE 1 TABLET BY MOUTH EVERY DAY 90 tablet 1   clotrimazole (LOTRIMIN) 1 % cream Apply to affected area 2 times daily 60 g 1   folic acid (FOLVITE) 1 MG tablet TAKE 1 TABLET BY MOUTH EVERY DAY 90 tablet 1   metoprolol tartrate (LOPRESSOR) 25 MG tablet Take 1 tablet (25 mg total) by mouth daily. 90 tablet 2   Multiple Vitamin (MULTIVITAMIN WITH MINERALS) TABS tablet Take 1 tablet by mouth daily.     nicotine polacrilex (COMMIT) 4 MG lozenge Take 1 lozenge (4 mg total) by mouth as needed for smoking cessation. 100 tablet 0   omeprazole (PRILOSEC) 20 MG capsule Take 20 mg by mouth as needed (acid reflux).     pantoprazole (PROTONIX) 40 MG tablet Take 1 tablet (40 mg total) by mouth daily. Protonix 40 mg daily for 4 weeks. 30 tablet 1   predniSONE (DELTASONE) 10 MG tablet Take two tablets daily x 1 week, then one tablet daily x 1 week. 21 tablet 0   rivaroxaban (XARELTO) 20 MG TABS tablet TAKE 1 TABLET EVERY DAY WITH SUPPER. 90 tablet 3   triamcinolone cream (KENALOG) 0.1 % Apply to affected 1-2 times daily 30 g 0   Current Facility-Administered Medications  Medication Dose Route Frequency Provider Last Rate Last Admin   clotrimazole (LOTRIMIN) 1 % cream   Topical BID Broadus John, MD        Allergies:   Penicillins  Social History: Social History   Socioeconomic History   Marital status: Married    Spouse name: Not on file   Number of children: 0   Years of education: Not on file   Highest education level: Not on file  Occupational History   Not on file  Tobacco Use   Smoking status: Every Day    Packs/day: 1.00    Years: 40.00    Total pack years: 40.00    Types: Cigarettes    Start date: 6   Smokeless tobacco: Never  Vaping Use   Vaping Use: Never used  Substance and Sexual Activity   Alcohol use: Not Currently    Alcohol/week: 8.0 standard drinks of alcohol    Types: 8 Cans of beer per week    Comment: Weekends only   Drug use: No   Sexual  activity: Not on file  Other Topics Concern   Not on file  Social History Narrative   Married, no children.   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   Tob: 10 pack-yr hx (current as of 03/2014).   Alcohol: 8 beers and pint of whisky on weekends only.   Denies hx of prob with drugs or alcohol.   Social Determinants of Health   Financial Resource Strain: Low Risk  (02/17/2021)   Overall Financial Resource Strain (CARDIA)    Difficulty of Paying Living Expenses: Not hard at all  Food Insecurity: No Food Insecurity (02/17/2021)   Hunger Vital Sign    Worried About Running Out of Food in the Last Year: Never true    Ran Out of Food in the Last Year: Never true  Transportation Needs: No Transportation Needs (02/17/2021)   PRAPARE - Hydrologist (Medical): No    Lack of Transportation (Non-Medical): No  Physical Activity: Inactive (02/17/2021)   Exercise Vital Sign    Days of Exercise per Week: 0 days    Minutes of Exercise per Session: 0 min  Stress: No Stress Concern Present (02/17/2021)   [place]    Feeling of Stress : Not at all  Social Connections: Moderately Integrated (02/17/2021)   Social Connection and Isolation Panel [NHANES]    Frequency of Communication with Friends and Family: More than three times a week    Frequency of Social Gatherings with Friends and Family: More than three times a week    Attends Religious Services: More than 4 times per year    Active Member of [employer] or Organizations: No    Attends Archivist Meetings: Never    Marital Status: Married  Human resources officer Violence: Not At Risk (02/17/2021)   Humiliation, Afraid, Rape, and Kick questionnaire    Fear of Current or Ex-Partner: No    Emotionally Abused: No    Physically Abused: No    Sexually Abused: No    Family History: Family History  Problem Relation  Age of Onset   Colon cancer Mother    Cancer Father    Breast cancer Sister    Breast cancer Sister    Stomach cancer Neg Hx    Esophageal cancer Neg Hx    Colon polyps Neg Hx      Review of Systems: All other systems reviewed and are otherwise negative except as noted above.  Physical Exam: Vitals:   01/17/22 0924  BP: (!) 142/102  Pulse: 91  SpO2: 98%  Weight: 173 lb (78.5 kg)  Height: '5\' 10"'$  (1.778 m)     GEN- The patient is well appearing, alert and oriented x 3 today.   HEENT: normocephalic, atraumatic; sclera clear, conjunctiva pink; hearing intact; oropharynx clear; neck supple  Lungs- Clear to ausculation bilaterally, normal work of breathing.  No wheezes, rales, rhonchi Heart- Regular rate and rhythm, no murmurs, rubs or gallops  GI- soft, non-tender, non-distended, bowel sounds present  Extremities- no clubbing, cyanosis, or edema  MS- no significant deformity or atrophy Skin- warm and dry, no rash or lesion; ILR pocket well healed Psych- euthymic mood, full affect Neuro- strength and sensation are intact  PPM Interrogation- reviewed in detail today,  See PACEART report  EKG:  EKG is not ordered today.  Recent Labs: 08/17/2021: ALT 52 11/21/2021: BUN 8; Creatinine 1.2; Hemoglobin 12.9; Platelets 227; Potassium 3.2; Sodium 142   Wt Readings from Last 3 Encounters:  01/17/22 173 lb (78.5 kg)  01/12/22 170 lb 4 oz (77.2 kg)  01/08/22 167 lb (75.8 kg)     Other studies Reviewed: Additional studies/ records that were reviewed today include: Previous EP office notes, Previous remote checks, Most recent labwork.   Assessment and Plan:  1. Cryptogenic Stroke s/p Medtronic Loop recorder Normal device function See [facility] Art report No changes today  2. PAF Burden 0% on loop since 03/2021. Continue Xarelto for CHA2DS2VASc  of at least 4  3. HTN Slightly elevated on arrival and recheck.  Hasn't taken meds yet this am.  We will change lopressor to toprol for  better coverage.    Current medicines are reviewed at length with the patient today.     Disposition:   Follow up with Dr. Myles Gip  in 12 Months    Signed, Annamaria Helling  01/17/2022 9:28 AM  [facility] [address] [place]  [address] [phone] (office) [phone] (fax)

## 2022-01-22 ENCOUNTER — Telehealth: Payer: Self-pay | Admitting: Pharmacist

## 2022-01-22 ENCOUNTER — Encounter: Payer: Medicare HMO | Admitting: Registered Nurse

## 2022-01-22 NOTE — Progress Notes (Signed)
Chronic Care Management Pharmacy Assistant   Name: Russell Russell  MRN: [id number] DOB: [birth year]   Reason for Encounter: General Adherence Call    Recent office visits:  01/12/2022 OV (Fam Med) Inda Coke, [place]; will trial oral prednisone x 2 weeks.   12/26/2021 OV (Fam Med) Inda Coke, [place]; Trial Sarna Lotion  08/31/2021 OV (Fam Med) Inda Coke, PA; no medication changes indicated.  08/17/2021 OV (Fam Med) Inda Coke, PA; no medication changes indicated.  Recent consult visits:  01/17/2022 OV (Cardiology) Shirley Friar, PA-C; no medication changes.  12/29/2021 OV (Vasc Surg) Broadus John, MD; I have prescribed some Lotrimin   10/24/2021 OV (Cardiology) Lenna Sciara, NP; no medication changes noted.  10/18/2021 OV (Gastro) Cirigliano, Vito V, DO; no medication changes noted.  08/25/2021 OV (Pulmonology) Gerald Leitz D, NP; no medication changes noted.  Hospital visits:  01/06/2022 ED visit for Rash  Patient was given dose of steroids.  Will add antihistamines.  Continue with the Lotrimin   Medications: Outpatient Encounter Medications as of 01/22/2022  Medication Sig Note   acetaminophen (TYLENOL) 325 MG tablet Take 2 tablets (650 mg total) by mouth every 4 (four) hours as needed for mild pain (or temp > 37.5 C (99.5 F)).    atorvastatin (LIPITOR) 40 MG tablet TAKE 1 TABLET BY MOUTH EVERY DAY    clotrimazole (LOTRIMIN) 1 % cream Apply to affected area 2 times daily    folic acid (FOLVITE) 1 MG tablet TAKE 1 TABLET BY MOUTH EVERY DAY    metoprolol succinate (TOPROL XL) 25 MG 24 hr tablet Take 1 tablet (25 mg total) by mouth at bedtime.    Multiple Vitamin (MULTIVITAMIN WITH MINERALS) TABS tablet Take 1 tablet by mouth daily.    nicotine polacrilex (COMMIT) 4 MG lozenge Take 1 lozenge (4 mg total) by mouth as needed for smoking cessation. 01/17/2022: HAS NOT PICKED UP RX YET   omeprazole (PRILOSEC) 20 MG capsule Take 20 mg by mouth as  needed (acid reflux).    pantoprazole (PROTONIX) 40 MG tablet Take 1 tablet (40 mg total) by mouth daily. Protonix 40 mg daily for 4 weeks.    predniSONE (DELTASONE) 10 MG tablet Take two tablets daily x 1 week, then one tablet daily x 1 week.    rivaroxaban (XARELTO) 20 MG TABS tablet TAKE 1 TABLET EVERY DAY WITH SUPPER.    triamcinolone cream (KENALOG) 0.1 % Apply to affected 1-2 times daily    Facility-Administered Encounter Medications as of 01/22/2022  Medication   clotrimazole (LOTRIMIN) 1 % cream   Contacted [name] for General Review Call   Chart Review:  Have there been any documented new, changed, or discontinued medications since last visit? {yes/no:20286} (If yes, include name, dose, frequency, date) Has there been any documented recent hospitalizations or ED visits since last visit with Clinical Pharmacist? {yes/no:20286} Brief Summary (including medication and/or Diagnosis changes):   Adherence Review:  Does the Clinical Pharmacist Assistant have access to adherence rates? {yes/no:20286} Adherence rates for STAR metric medications (List medication(s)/day supply/ last 2 fill dates). Adherence rates for medications indicated for disease state being reviewed (List medication(s)/day supply/ last 2 fill dates). Does the patient have >5 day gap between last estimated fill dates for any of the above medications or other medication gaps? {yes/no:20286} Reason for medication gaps.   Disease State Questions:  Able to connect with Patient? {yes/no:20286} Did patient have any problems with their health recently? {yes/no:20286} Note problems and Concerns: Have  you had any admissions or emergency room visits or worsening of your condition(s) since last visit? {yes/no:20286} Details of ED visit, hospital visit and/or worsening condition(s): Have you had any visits with new specialists or providers since your last visit? {yes/no:20286} Explain: Have you had any new health  care problem(s) since your last visit? {yes/no:20286} New problem(s) reported: Have you run out of any of your medications since you last spoke with clinical pharmacist? {yes/no:20286} What caused you to run out of your medications? Are there any medications you are not taking as prescribed? {yes/no:20286} What kept you from taking your medications as prescribed? Are you having any issues or side effects with your medications? {yes/no:20286} Note of issues or side effects: Do you have any other health concerns or questions you want to discuss with your Clinical Pharmacist before your next visit? {yes/no:20286} Note additional concerns and questions from Patient. Are there any health concerns that you feel we can do a better job addressing? {yes/no:20286} Note Patient's response. Are you having any problems with any of the following since the last visit: (select all that apply)  {General Call:27390}  Details: 12. Any falls since last visit? {yes/no:20286}  Details: 13. Any increased or uncontrolled pain since last visit? {yes/no:20286}  Details: 14. Next visit Type: {Telephone/Office:25179}       Visit with:        Date:        Time:  3. Additional Details? {yes/no:20286}    Care Gaps: Medicare Annual Wellness: Due soon 02/17/2022 Hemoglobin A1C: 6.2% on 07/27/2021 Colonoscopy: Discontinued  Future Appointments  Date Time Provider [place]  02/19/2022  7:25 AM [facility] DEVICE REMOTES [facility]  03/01/2022 10:00 AM Inda Coke, PA [facility]  03/01/2022  2:30 PM [facility] HEALTH COACH [facility]  03/26/2022  7:25 AM [facility] DEVICE REMOTES [facility]  04/30/2022  7:25 AM [facility] DEVICE REMOTES [facility]  06/04/2022  7:25 AM [facility] DEVICE REMOTES [facility]  07/09/2022  7:25 AM [facility] DEVICE REMOTES [facility]  08/07/2022  3:00 PM [facility] CCM PHARMACIST [facility]   Star  Rating Drugs: Atorvastatin 40 mg last filled 11/10/2021 90 DS  April D Calhoun, [place] Pharmacist Assistant [phone]

## 2022-01-26 ENCOUNTER — Ambulatory Visit: Payer: Medicare HMO | Admitting: Physician Assistant

## 2022-01-29 ENCOUNTER — Ambulatory Visit: Payer: Medicare HMO | Admitting: Physician Assistant

## 2022-01-30 ENCOUNTER — Encounter: Payer: Self-pay | Admitting: Physician Assistant

## 2022-01-30 DIAGNOSIS — L299 Pruritus, unspecified: Secondary | ICD-10-CM

## 2022-02-03 ENCOUNTER — Other Ambulatory Visit: Payer: Self-pay | Admitting: Gastroenterology

## 2022-02-07 ENCOUNTER — Other Ambulatory Visit: Payer: Self-pay | Admitting: Physician Assistant

## 2022-02-09 ENCOUNTER — Other Ambulatory Visit: Payer: Self-pay | Admitting: Physician Assistant

## 2022-02-15 NOTE — Progress Notes (Signed)
[facility]link Summary Report / Loop Recorder 

## 2022-02-19 ENCOUNTER — Ambulatory Visit (INDEPENDENT_AMBULATORY_CARE_PROVIDER_SITE_OTHER): Payer: Medicare HMO

## 2022-02-19 DIAGNOSIS — I639 Cerebral infarction, unspecified: Secondary | ICD-10-CM

## 2022-02-19 LAB — CUP PACEART REMOTE DEVICE CHECK
Date Time Interrogation Session: 20231210232233
Implantable Pulse Generator Implant Date: 20210423

## 2022-02-23 ENCOUNTER — Telehealth: Payer: Self-pay | Admitting: Cardiovascular Disease

## 2022-02-23 NOTE — Telephone Encounter (Signed)
Pt c/o medication issue:  1. Name of Medication: rivaroxaban (XARELTO) 20 MG TABS tablet   2. How are you currently taking this medication (dosage and times per day)?   TAKE 1 TABLET EVERY DAY WITH SUPPER    3. Are you having a reaction (difficulty breathing--STAT)? no  4. What is your medication issue? Calling to see if the office has any samples the patient can't get, until his medication come in the mail. Please advise  .

## 2022-02-23 NOTE — Telephone Encounter (Signed)
Spoke with the patient's wife who states that they ordered a refill of xarelto but it is not going to come in for another week and the patient is out. Advised that they can come pick up a one week sample of medication.

## 2022-02-28 ENCOUNTER — Other Ambulatory Visit: Payer: Self-pay | Admitting: Acute Care

## 2022-02-28 ENCOUNTER — Other Ambulatory Visit: Payer: Self-pay | Admitting: Nephrology

## 2022-02-28 DIAGNOSIS — N182 Chronic kidney disease, stage 2 (mild): Secondary | ICD-10-CM

## 2022-02-28 DIAGNOSIS — N261 Atrophy of kidney (terminal): Secondary | ICD-10-CM

## 2022-03-01 ENCOUNTER — Ambulatory Visit: Payer: Medicare HMO

## 2022-03-01 ENCOUNTER — Ambulatory Visit: Payer: Medicare HMO | Admitting: Physician Assistant

## 2022-03-02 ENCOUNTER — Ambulatory Visit: Payer: Medicare HMO | Admitting: Physician Assistant

## 2022-03-26 ENCOUNTER — Ambulatory Visit (INDEPENDENT_AMBULATORY_CARE_PROVIDER_SITE_OTHER): Payer: Medicare HMO

## 2022-03-26 DIAGNOSIS — I639 Cerebral infarction, unspecified: Secondary | ICD-10-CM

## 2022-03-27 LAB — CUP PACEART REMOTE DEVICE CHECK
Date Time Interrogation Session: 20240112231517
Implantable Pulse Generator Implant Date: 20210423

## 2022-03-29 ENCOUNTER — Telehealth: Payer: Self-pay | Admitting: Cardiovascular Disease

## 2022-03-29 MED ORDER — RIVAROXABAN 20 MG PO TABS: ORAL_TABLET | ORAL | 3 refills | Status: DC

## 2022-03-29 NOTE — Telephone Encounter (Signed)
Pt c/o medication issue:  1. Name of Medication:   rivaroxaban (XARELTO) 20 MG TABS tablet   2. How are you currently taking this medication (dosage and times per day)?   As prescribed  3. Are you having a reaction (difficulty breathing--STAT)?   No  4. What is your medication issue?   Wife stated patient has been out of this medication for a week and would like to get some samples as the patient is in the donut hole.

## 2022-03-29 NOTE — Progress Notes (Signed)
Care[facility] Summary Report / Loop Recorder

## 2022-03-29 NOTE — Telephone Encounter (Signed)
Spoke with the patient's wife who states that [facility] is needing a new prescription sent in before they will refill his Xarelto. He is currently out. I have sent in a new prescription and will leave samples for patient. Advised that she will need to let us know sooner before he runs out next time so he does not go an extended period without his xarelto.

## 2022-04-02 DIAGNOSIS — L299 Pruritus, unspecified: Secondary | ICD-10-CM | POA: Diagnosis not present

## 2022-04-02 DIAGNOSIS — L309 Dermatitis, unspecified: Secondary | ICD-10-CM | POA: Diagnosis not present

## 2022-04-02 DIAGNOSIS — L853 Xerosis cutis: Secondary | ICD-10-CM | POA: Diagnosis not present

## 2022-04-10 ENCOUNTER — Telehealth: Payer: Self-pay | Admitting: Physician Assistant

## 2022-04-10 NOTE — Telephone Encounter (Signed)
Copied from [place] [phone]. Topic: Medicare AWV >> Apr 10, 2022  9:07 AM Gillis Santa wrote: Reason for CRM: LVM PATIENT TO CALL [phone] TO SCHEDULE AWVS WITH HEALTH COACH TINA IN OFFICE OR TELE VISIT

## 2022-04-29 LAB — CUP PACEART REMOTE DEVICE CHECK
Date Time Interrogation Session: 20240214231646
Implantable Pulse Generator Implant Date: 20210423

## 2022-04-30 ENCOUNTER — Ambulatory Visit (INDEPENDENT_AMBULATORY_CARE_PROVIDER_SITE_OTHER): Payer: Medicare HMO

## 2022-04-30 DIAGNOSIS — I639 Cerebral infarction, unspecified: Secondary | ICD-10-CM

## 2022-05-08 NOTE — Progress Notes (Signed)
[facility]link Summary Report / Loop Recorder 

## 2022-06-04 ENCOUNTER — Ambulatory Visit: Payer: Medicare HMO | Attending: Cardiovascular Disease

## 2022-06-04 DIAGNOSIS — I639 Cerebral infarction, unspecified: Secondary | ICD-10-CM

## 2022-06-04 LAB — CUP PACEART REMOTE DEVICE CHECK
Date Time Interrogation Session: 20240324231557
Implantable Pulse Generator Implant Date: 20210423

## 2022-06-12 DIAGNOSIS — H2513 Age-related nuclear cataract, bilateral: Secondary | ICD-10-CM | POA: Diagnosis not present

## 2022-06-13 NOTE — Progress Notes (Signed)
[facility]link Summary Report / Loop Recorder 

## 2022-06-21 ENCOUNTER — Telehealth: Payer: Self-pay | Admitting: Physician Assistant

## 2022-06-21 NOTE — Telephone Encounter (Signed)
Copied from CRM [phone]. Topic: Medicare AWV >> Jun 21, 2022  9:25 AM Gwenith Spitz wrote: Reason for CRM: Called patient to schedule Medicare Annual Wellness Visit (AWV). Left message for patient to call back and schedule Medicare Annual Wellness Visit (AWV).  Last date of AWV: 02/17/2021  Please schedule an appointment at any time with Inetta Fermo, [facility]. Please schedule AWVS with Inetta Fermo, [facility].  If any questions, please contact me at [phone].  Thank you ,  [facility] AWV TEAM Direct Dial [phone]

## 2022-07-04 ENCOUNTER — Ambulatory Visit (INDEPENDENT_AMBULATORY_CARE_PROVIDER_SITE_OTHER): Payer: Medicare HMO | Admitting: Physician Assistant

## 2022-07-04 ENCOUNTER — Encounter: Payer: Self-pay | Admitting: Physician Assistant

## 2022-07-04 VITALS — BP 150/100 | HR 83 | Temp 97.8°F | Ht 70.0 in | Wt 184.2 lb

## 2022-07-04 DIAGNOSIS — H6123 Impacted cerumen, bilateral: Secondary | ICD-10-CM | POA: Diagnosis not present

## 2022-07-04 DIAGNOSIS — I10 Essential (primary) hypertension: Secondary | ICD-10-CM

## 2022-07-04 NOTE — Progress Notes (Signed)
Russell Russell is a 72 y.o. male here for a follow up of a pre-existing problem.  History of Present Illness:   Chief Complaint  Patient presents with   Cerumen Impaction    Pt c/o ear wax both ears.    HPI  Cerumen impaction Endorses b/l cerumen impaction Wife tries to clean his ears at home but has been unsuccessful Endorses difficulty hearing at times No pain, fever, drainage noted  HTN Currently taking Toprol XL 25 mg daily. At home blood pressure readings are: not regularly checked. Patient denies chest pain, SOB, blurred vision, dizziness, unusual headaches, lower leg swelling. Patient is  compliant with medication. Denies excessive caffeine intake, stimulant usage, excessive alcohol intake, or increase in salt consumption.  BP Readings from Last 3 Encounters:  07/04/22 (!) 150/100  01/17/22 (!) 138/96  01/12/22 (!) 160/90     Past Medical History:  Diagnosis Date   Alcohol abuse    drinks 1 gallon of Gin every weekend, nothing during the week x >15 yrs   Atrial fibrillation    Elevated transaminase level    +elev bili: abd u/s 06/2014 showed stable small hepatic hemangiomas, o/w normal.   Essential hypertension    GERD (gastroesophageal reflux disease)    Hyperlipidemia 03/2014   Atorv started-chol improved   Hypertension    Impaired fasting glucose 03/2014   Nephrolithiasis    PUD (peptic ulcer disease)    Stroke 2021   Tobacco dependence    chantix: "psych effects"     Social History   Tobacco Use   Smoking status: Every Day    Packs/day: 1.00    Years: 40.00    Additional pack years: 0.00    Total pack years: 40.00    Types: Cigarettes    Start date: 1974   Smokeless tobacco: Never  Vaping Use   Vaping Use: Never used  Substance Use Topics   Alcohol use: Not Currently    Alcohol/week: 8.0 standard drinks of alcohol    Types: 8 Cans of beer per week    Comment: Weekends only   Drug use: No    Past Surgical History:  Procedure Laterality  Date   COLONOSCOPY  2005   Recall 10 yrs ([place])   LOOP RECORDER INSERTION N/A 07/03/2019   Procedure: LOOP RECORDER INSERTION;  Surgeon: Hillis Range, MD;  Location: [facility];  Service: Cardiovascular;  Laterality: N/A;   removal of kidney stones  2006   cystoscopic--removed from ureter.  No prob since.    Family History  Problem Relation Age of Onset   Colon cancer Mother    Cancer Father    Breast cancer Sister    Breast cancer Sister    Stomach cancer Neg Hx    Esophageal cancer Neg Hx    Colon polyps Neg Hx     Allergies  Allergen Reactions   Penicillins Rash    Current Medications:   Current Outpatient Medications:    acetaminophen (TYLENOL) 325 MG tablet, Take 2 tablets (650 mg total) by mouth every 4 (four) hours as needed for mild pain (or temp > 37.5 C (99.5 F))., Disp:  , Rfl:    atorvastatin (LIPITOR) 40 MG tablet, TAKE 1 TABLET BY MOUTH EVERY DAY, Disp: 90 tablet, Rfl: 1   clotrimazole (LOTRIMIN) 1 % cream, Apply to affected area 2 times daily, Disp: 60 g, Rfl: 1   folic acid (FOLVITE) 1 MG tablet, TAKE 1 TABLET BY MOUTH EVERY DAY, Disp: 90  tablet, Rfl: 1   metoprolol succinate (TOPROL XL) 25 MG 24 hr tablet, Take 1 tablet (25 mg total) by mouth at bedtime., Disp: 90 tablet, Rfl: 3   Multiple Vitamin (MULTIVITAMIN WITH MINERALS) TABS tablet, Take 1 tablet by mouth daily., Disp:  , Rfl:    nicotine polacrilex (COMMIT) 4 MG lozenge, Take 1 lozenge (4 mg total) by mouth as needed for smoking cessation., Disp: 100 tablet, Rfl: 0   omeprazole (PRILOSEC) 20 MG capsule, Take 20 mg by mouth as needed (acid reflux)., Disp: , Rfl:    pantoprazole (PROTONIX) 40 MG tablet, Take 1 tablet (40 mg total) by mouth daily. Protonix 40 mg daily for 4 weeks., Disp: 30 tablet, Rfl: 1   rivaroxaban (XARELTO) 20 MG TABS tablet, TAKE 1 TABLET EVERY DAY WITH SUPPER., Disp: 90 tablet, Rfl: 3   triamcinolone cream (KENALOG) 0.1 %, APPLY TO AFFECTED 1-2 TIMES DAILY, Disp: 30 g,  Rfl: 0  Current Facility-Administered Medications:    clotrimazole (LOTRIMIN) 1 % cream, , Topical, BID, Victorino Sparrow, MD   Review of Systems:   ROS Negative unless otherwise specified per HPI.   Vitals:   Vitals:   07/04/22 1456 07/04/22 1534  BP: (!) 150/100 (!) 150/100  Pulse: 83   Temp: 97.8 F (36.6 C)   TempSrc: Temporal   SpO2: 97%   Weight: 184 lb 4 oz (83.6 kg)   Height:  (1.778 m)      Body mass index is 26.44 kg/m.  Physical Exam:   Physical Exam Vitals and nursing note reviewed.  Constitutional:      General: He is not in acute distress.    Appearance: He is well-developed. He is not ill-appearing or toxic-appearing.  HENT:     Right Ear: There is impacted cerumen.     Left Ear: There is impacted cerumen.  Cardiovascular:     Rate and Rhythm: Normal rate and regular rhythm.     Pulses: Normal pulses.     Heart sounds: Normal heart sounds, S1 normal and S2 normal.  Pulmonary:     Effort: Pulmonary effort is normal.     Breath sounds: Normal breath sounds.  Skin:    General: Skin is warm and dry.  Neurological:     Mental Status: He is alert.     GCS: GCS eye subscore is 4. GCS verbal subscore is 5. GCS motor subscore is 6.  Psychiatric:        Speech: Speech normal.        Behavior: Behavior normal. Behavior is cooperative.    Ceruminosis is noted.  Wax is removed by syringing and manual debridement.   Assessment and Plan:   Essential hypertension Above goal No evidence of end-organ damage Recommend close monitoring at home and consistent follow-up with specialists If BP > 130/80 recommend reach out to prescriber  Bilateral impacted cerumen Tolerated procedure well No evidence of infection or need for abx Follow-up as needed  Jarold Motto, PA-C

## 2022-07-05 ENCOUNTER — Ambulatory Visit (INDEPENDENT_AMBULATORY_CARE_PROVIDER_SITE_OTHER): Payer: Medicare HMO

## 2022-07-05 VITALS — BP 128/100 | Temp 98.0°F

## 2022-07-05 DIAGNOSIS — Z0000 Encounter for general adult medical examination without abnormal findings: Secondary | ICD-10-CM | POA: Diagnosis not present

## 2022-07-05 DIAGNOSIS — Z122 Encounter for screening for malignant neoplasm of respiratory organs: Secondary | ICD-10-CM | POA: Diagnosis not present

## 2022-07-05 NOTE — Progress Notes (Addendum)
Subjective:   Russell Russell is a 72 y.o. male who presents for Medicare Annual/Subsequent preventive examination. Along with Wife [name]   Review of Systems     Cardiac Risk Factors include: advanced age (>24men, >13 women);male gender;dyslipidemia;hypertension     Objective:    Today's Vitals   07/05/22 1458  BP: (!) 128/100  Temp: 98 F (36.7 C)  SpO2: 94%   There is no height or weight on file to calculate BMI.     07/05/2022    3:08 PM 02/17/2021    2:28 PM 06/07/2020   11:52 AM 08/06/2019    4:14 PM 07/07/2019    4:02 PM 07/02/2019    3:22 AM 06/30/2019    8:01 PM  Advanced Directives  Does Patient Have a Medical Advance Directive? No No No No No  No  Would patient like information on creating a medical advance directive? No - Patient declined Yes (MAU/Ambulatory/Procedural Areas - Information given) No - Patient declined Yes (MAU/Ambulatory/Procedural Areas - Information given) No - Patient declined No - Patient declined     Current Medications (verified) Outpatient Encounter Medications as of 07/05/2022  Medication Sig   acetaminophen (TYLENOL) 325 MG tablet Take 2 tablets (650 mg total) by mouth every 4 (four) hours as needed for mild pain (or temp > 37.5 C (99.5 F)).   atorvastatin (LIPITOR) 40 MG tablet TAKE 1 TABLET BY MOUTH EVERY DAY   folic acid (FOLVITE) 1 MG tablet TAKE 1 TABLET BY MOUTH EVERY DAY   metoprolol succinate (TOPROL XL) 25 MG 24 hr tablet Take 1 tablet (25 mg total) by mouth at bedtime.   Multiple Vitamin (MULTIVITAMIN WITH MINERALS) TABS tablet Take 1 tablet by mouth daily.   omeprazole (PRILOSEC) 20 MG capsule Take 20 mg by mouth as needed (acid reflux).   rivaroxaban (XARELTO) 20 MG TABS tablet TAKE 1 TABLET EVERY DAY WITH SUPPER.   nicotine polacrilex (COMMIT) 4 MG lozenge Take 1 lozenge (4 mg total) by mouth as needed for smoking cessation. (Patient not taking: Reported on 07/05/2022)   [DISCONTINUED] clotrimazole (LOTRIMIN) 1 % cream Apply to  affected area 2 times daily   [DISCONTINUED] pantoprazole (PROTONIX) 40 MG tablet Take 1 tablet (40 mg total) by mouth daily. Protonix 40 mg daily for 4 weeks.   [DISCONTINUED] triamcinolone cream (KENALOG) 0.1 % APPLY TO AFFECTED 1-2 TIMES DAILY   Facility-Administered Encounter Medications as of 07/05/2022  Medication   clotrimazole (LOTRIMIN) 1 % cream    Allergies (verified) Penicillins   History: Past Medical History:  Diagnosis Date   Alcohol abuse    drinks 1 gallon of Gin every weekend, nothing during the week x >15 yrs   Atrial fibrillation    Elevated transaminase level    +elev bili: abd u/s 06/2014 showed stable small hepatic hemangiomas, o/w normal.   Essential hypertension    GERD (gastroesophageal reflux disease)    Hyperlipidemia 03/2014   Atorv started-chol improved   Hypertension    Impaired fasting glucose 03/2014   Nephrolithiasis    PUD (peptic ulcer disease)    Stroke 2021   Tobacco dependence    chantix: "psych effects"   Past Surgical History:  Procedure Laterality Date   COLONOSCOPY  2005   Recall 10 yrs ([place])   LOOP RECORDER INSERTION N/A 07/03/2019   Procedure: LOOP RECORDER INSERTION;  Surgeon: Hillis Range, MD;  Location: [facility];  Service: Cardiovascular;  Laterality: N/A;   removal of kidney stones  2006  cystoscopic--removed from ureter.  No prob since.   Family History  Problem Relation Age of Onset   Colon cancer Mother    Cancer Father    Breast cancer Sister    Breast cancer Sister    Stomach cancer Neg Hx    Esophageal cancer Neg Hx    Colon polyps Neg Hx    Social History   Socioeconomic History   Marital status: Married    Spouse name: Not on file   Number of children: 0   Years of education: Not on file   Highest education level: Not on file  Occupational History   Not on file  Tobacco Use   Smoking status: Every Day    Packs/day: 0.25    Years: 40.00    Additional pack years: 0.00    Total pack  years: 10.00    Types: Cigarettes    Start date: 23   Smokeless tobacco: Never  Vaping Use   Vaping Use: Never used  Substance and Sexual Activity   Alcohol use: Not Currently    Alcohol/week: 8.0 standard drinks of alcohol    Types: 8 Cans of beer per week    Comment: Weekends only   Drug use: No   Sexual activity: Not on file  Other Topics Concern   Not on file  Social History Narrative   Married, no children.   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   Tob: 10 pack-yr hx (current as of 03/2014).   Alcohol: 8 beers and pint of whisky on weekends only.   Denies hx of prob with drugs or alcohol.   Social Determinants of Health   Financial Resource Strain: Low Risk  (07/05/2022)   Overall Financial Resource Strain (CARDIA)    Difficulty of Paying Living Expenses: Not hard at all  Food Insecurity: No Food Insecurity (07/05/2022)   Hunger Vital Sign    Worried About Running Out of Food in the Last Year: Never true    Ran Out of Food in the Last Year: Never true  Transportation Needs: No Transportation Needs (07/05/2022)   PRAPARE - Administrator, Civil Service (Medical): No    Lack of Transportation (Non-Medical): No  Physical Activity: Inactive (07/05/2022)   Exercise Vital Sign    Days of Exercise per Week: 0 days    Minutes of Exercise per Session: 0 min  Stress: No Stress Concern Present (07/05/2022)   [employer] of Occupational Health - Occupational Stress Questionnaire    Feeling of Stress : Not at all  Social Connections: Moderately Isolated (07/05/2022)   Social Connection and Isolation Panel [NHANES]    Frequency of Communication with Friends and Family: Once a week    Frequency of Social Gatherings with Friends and Family: Once a week    Attends Religious Services: 1 to 4 times per year    Active Member of [employer] or Organizations: No    Attends Engineer, structural: Never    Marital Status:  Married    Tobacco Counseling Ready to quit: Not Answered Counseling given: Not Answered   Clinical Intake:  Pre-visit preparation completed: Yes  Pain : No/denies pain     BMI - recorded: 26.44 Nutritional Status: BMI 25 -29 Overweight Nutritional Risks: None Diabetes: No  How often do you need to have someone help you when you read instructions, pamphlets, or other written materials from your doctor or pharmacy?: 1 - Never  Diabetic?no  Interpreter Needed?: No  Information entered by :: Lanier Ensign, LPN   Activities of Daily Living    07/05/2022    3:10 PM  In your present state of health, do you have any difficulty performing the following activities:  Hearing? 0  Vision? 0  Difficulty concentrating or making decisions? 0  Walking or climbing stairs? 0  Dressing or bathing? 0  Doing errands, shopping? 0  Preparing Food and eating ? N  Using the Toilet? N  In the past six months, have you accidently leaked urine? N  Do you have problems with loss of bowel control? N  Managing your Medications? N  Managing your Finances? N  Housekeeping or managing your Housekeeping? N    Patient Care Team: Jarold Motto, [place] as PCP - General (Physician Assistant) Mealor, Roberts Gaudy, MD as PCP - Electrophysiology (Cardiology) Erroll Luna, [facility] as Pharmacist (Pharmacist)  Indicate any recent Medical Services you may have received from other than [facility] providers in the past year (date may be approximate).     Assessment:   This is a routine wellness examination for Zahki.  Hearing/Vision screen Hearing Screening - Comments:: Pt denies any hearing issues  Vision Screening - Comments:: Pt follows up with [facility] for annual ye exams   Dietary issues and exercise activities discussed: Current Exercise Habits: The patient does not participate in regular exercise at present   Goals Addressed             This Visit's Progress    Patient Stated        Get rid of walker        Depression Screen    07/05/2022    3:07 PM 07/26/2021    8:46 AM 02/17/2021    2:28 PM 01/23/2021   10:06 AM 10/25/2020   11:00 AM 10/21/2020   10:03 AM 07/26/2020   10:15 AM  PHQ 2/9 Scores  PHQ - 2 Score 0 0 0 0 0 0 0    Fall Risk    07/05/2022    3:09 PM 07/26/2021    8:45 AM 02/17/2021    2:29 PM 01/23/2021   10:06 AM 10/21/2020   10:02 AM  Fall Risk   Falls in the past year? 0 0 0 0 0  Number falls in past yr: 0 0 0 0 0  Injury with Fall? 0 0 0 0 0  Risk for fall due to : Impaired vision  Impaired balance/gait;Impaired vision    Risk for fall due to: Comment   use walker    Follow up Falls prevention discussed  Falls prevention discussed      FALL RISK PREVENTION PERTAINING TO THE HOME:  Any stairs in or around the home? No  If so, are there any without handrails? No  Home free of loose throw rugs in walkways, pet beds, electrical cords, etc? Yes  Adequate lighting in your home to reduce risk of falls? Yes   ASSISTIVE DEVICES UTILIZED TO PREVENT FALLS:  Life alert? No  Use of a cane, walker or w/c? Yes  Grab bars in the bathroom? No  Shower chair or bench in shower? Yes  Elevated toilet seat or a handicapped toilet? No   TIMED UP AND GO:  Was the test performed? Yes .  Length of time to ambulate 10 feet: 25 sec.   Gait slow and steady with assistive device  Cognitive Function:        07/05/2022  3:11 PM 02/17/2021    2:32 PM  6CIT Screen  What Year? 0 points 0 points  What month? 3 points 0 points  What time? 0 points 0 points  Count back from 20 0 points 0 points  Months in reverse 4 points 0 points  Repeat phrase 2 points 0 points  Total Score 9 points 0 points    Immunizations Immunization History  Administered Date(s) Administered   Tdap 03/12/2006    TDAP status: Due, Education has been provided regarding the importance of this vaccine. Advised may receive this vaccine at local pharmacy or Health Dept. Aware to  provide a copy of the vaccination record if obtained from local pharmacy or Health Dept. Verbalized acceptance and understanding.  Flu Vaccine status: Declined, Education has been provided regarding the importance of this vaccine but patient still declined. Advised may receive this vaccine at local pharmacy or Health Dept. Aware to provide a copy of the vaccination record if obtained from local pharmacy or Health Dept. Verbalized acceptance and understanding.  Pneumococcal vaccine status: Declined,  Education has been provided regarding the importance of this vaccine but patient still declined. Advised may receive this vaccine at local pharmacy or Health Dept. Aware to provide a copy of the vaccination record if obtained from local pharmacy or Health Dept. Verbalized acceptance and understanding.   Covid-19 vaccine status: Completed vaccines  Qualifies for Shingles Vaccine? Yes   Zostavax completed No   Shingrix Completed?: No.    Education has been provided regarding the importance of this vaccine. Patient has been advised to call insurance company to determine out of pocket expense if they have not yet received this vaccine. Advised may also receive vaccine at local pharmacy or Health Dept. Verbalized acceptance and understanding.  Screening Tests Health Maintenance  Topic Date Due   DTaP/Tdap/Td (2 - Td or Tdap) 03/12/2016   Lung Cancer Screening  08/30/2022   Medicare Annual Wellness (AWV)  07/05/2023   Fecal DNA (Cologuard)  12/16/2023   Hepatitis C Screening  Completed   HPV VACCINES  Aged Out   Pneumonia Vaccine 30+ Years old  Discontinued   INFLUENZA VACCINE  Discontinued   COLONOSCOPY (Pts 45-58yrs Insurance coverage will need to be confirmed)  Discontinued   COVID-19 Vaccine  Discontinued   Zoster Vaccines- Shingrix  Discontinued    Health Maintenance  Health Maintenance Due  Topic Date Due   DTaP/Tdap/Td (2 - Td or Tdap) 03/12/2016    Colorectal cancer screening: Type of  screening: Cologuard. Completed 12/15/20. Repeat every 3 years  Lung Cancer Screening: (Low Dose CT Chest recommended if Age 47-80 years, 30 pack-year currently smoking OR have quit w/in 15years.) does qualify.   Lung Cancer Screening Referral: order placed   Additional Screening:  Hepatitis C Screening:  Completed 07/24/19  Vision Screening: Recommended annual ophthalmology exams for early detection of glaucoma and other disorders of the eye. Is the patient up to date with their annual eye exam?  Yes  Who is the provider or what is the name of the office in which the patient attends annual eye exams? [facility]  If pt is not established with a provider, would they like to be referred to a provider to establish care? No .   Dental Screening: Recommended annual dental exams for proper oral hygiene  Community Resource Referral / Chronic Care Management: CRR required this visit?  No   CCM required this visit?  No      Plan:  I have personally reviewed and noted the following in the patient's chart:   Medical and social history Use of alcohol, tobacco or illicit drugs  Current medications and supplements including opioid prescriptions. Patient is not currently taking opioid prescriptions. Functional ability and status Nutritional status Physical activity Advanced directives List of other physicians Hospitalizations, surgeries, and ER visits in previous 12 months Vitals Screenings to include cognitive, depression, and falls Referrals and appointments  In addition, I have reviewed and discussed with patient certain preventive protocols, quality metrics, and best practice recommendations. A written personalized care plan for preventive services as well as general preventive health recommendations were provided to patient.     Marzella Schlein, LPN   [id number]/61/[id number]   Nurse Notes: none

## 2022-07-05 NOTE — Patient Instructions (Signed)
[name] , Thank you for taking time to come for your Medicare Wellness Visit. I appreciate your ongoing commitment to your health goals. Please review the following plan we discussed and let me know if I can assist you in the future.   These are the goals we discussed:  Goals      Lifestyle Change-Hypertension     Timeframe:  Long-Range Goal Priority:  High Start Date:  08/08/21                           Expected End Date:  02/08/22                     Follow Up Date 11/08/21    Work to implement walking into lifestyle.  Start slow and work your way up!    Why is this important?   The changes that you are asked to make may be hard to do.  This is especially true when the changes are life-long.  Knowing why it is important to you is the first step.  Working on the change with your family or support person helps you not feel alone.  Reward yourself and family or support person when goals are met. This can be an activity you choose like bowling, hiking, biking, swimming or shooting hoops.     Notes:      Patient Stated     None at this time         This is a list of the screening recommended for you and due dates:  Health Maintenance  Topic Date Due   DTaP/Tdap/Td vaccine (2 - Td or Tdap) 03/12/2016   Medicare Annual Wellness Visit  02/17/2022   Screening for Lung Cancer  08/30/2022   Cologuard (Stool DNA test)  12/16/2023   Hepatitis C Screening: USPSTF Recommendation to screen - Ages 18-79 yo.  Completed   HPV Vaccine  Aged Out   Pneumonia Vaccine  Discontinued   Flu Shot  Discontinued   Colon Cancer Screening  Discontinued   COVID-19 Vaccine  Discontinued   Zoster (Shingles) Vaccine  Discontinued    Advanced directives: Advance directive discussed with you today. Even though you declined this today please call our office should you change your mind and we can give you the proper paperwork for you to fill out.  Conditions/risks identified: get to walking without  walker   Next appointment: Follow up in one year for your annual wellness visit.   Preventive Care 37 Years and Older, Male  Preventive care refers to lifestyle choices and visits with your health care provider that can promote health and wellness. What does preventive care include? A yearly physical exam. This is also called an annual well check. Dental exams once or twice a year. Routine eye exams. Ask your health care provider how often you should have your eyes checked. Personal lifestyle choices, including: Daily care of your teeth and gums. Regular physical activity. Eating a healthy diet. Avoiding tobacco and drug use. Limiting alcohol use. Practicing safe sex. Taking low doses of aspirin every day. Taking vitamin and mineral supplements as recommended by your health care provider. What happens during an annual well check? The services and screenings done by your health care provider during your annual well check will depend on your age, overall health, lifestyle risk factors, and family history of disease. Counseling  Your health care provider may ask you questions about your: Alcohol use.  Tobacco use. Drug use. Emotional well-being. Home and relationship well-being. Sexual activity. Eating habits. History of falls. Memory and ability to understand (cognition). Work and work Astronomer. Screening  You may have the following tests or measurements: Height, weight, and BMI. Blood pressure. Lipid and cholesterol levels. These may be checked every 5 years, or more frequently if you are over 45 years old. Skin check. Lung cancer screening. You may have this screening every year starting at age 29 if you have a 30-pack-year history of smoking and currently smoke or have quit within the past 15 years. Fecal occult blood test (FOBT) of the stool. You may have this test every year starting at age 58. Flexible sigmoidoscopy or colonoscopy. You may have a sigmoidoscopy every 5  years or a colonoscopy every 10 years starting at age 57. Prostate cancer screening. Recommendations will vary depending on your family history and other risks. Hepatitis C blood test. Hepatitis B blood test. Sexually transmitted disease (STD) testing. Diabetes screening. This is done by checking your blood sugar (glucose) after you have not eaten for a while (fasting). You may have this done every 1-3 years. Abdominal aortic aneurysm (AAA) screening. You may need this if you are a current or former smoker. Osteoporosis. You may be screened starting at age 24 if you are at high risk. Talk with your health care provider about your test results, treatment options, and if necessary, the need for more tests. Vaccines  Your health care provider may recommend certain vaccines, such as: Influenza vaccine. This is recommended every year. Tetanus, diphtheria, and acellular pertussis (Tdap, Td) vaccine. You may need a Td booster every 10 years. Zoster vaccine. You may need this after age 101. Pneumococcal 13-valent conjugate (PCV13) vaccine. One dose is recommended after age 61. Pneumococcal polysaccharide (PPSV23) vaccine. One dose is recommended after age 33. Talk to your health care provider about which screenings and vaccines you need and how often you need them. This information is not intended to replace advice given to you by your health care provider. Make sure you discuss any questions you have with your health care provider. Document Released: 03/25/2015 Document Revised: 11/16/2015 Document Reviewed: 12/28/2014 Elsevier Interactive Patient Education  2017 [employer].  Fall Prevention in the Home Falls can cause injuries. They can happen to people of all ages. There are many things you can do to make your home safe and to help prevent falls. What can I do on the outside of my home? Regularly fix the edges of walkways and driveways and fix any cracks. Remove anything that might make you  trip as you walk through a door, such as a raised step or threshold. Trim any bushes or trees on the path to your home. Use bright outdoor lighting. Clear any walking paths of anything that might make someone trip, such as rocks or tools. Regularly check to see if handrails are loose or broken. Make sure that both sides of any steps have handrails. Any raised decks and porches should have guardrails on the edges. Have any leaves, snow, or ice cleared regularly. Use sand or salt on walking paths during winter. Clean up any spills in your garage right away. This includes oil or grease spills. What can I do in the bathroom? Use night lights. Install grab bars by the toilet and in the tub and shower. Do not use towel bars as grab bars. Use non-skid mats or decals in the tub or shower. If you need to sit down in  the shower, use a plastic, non-slip stool. Keep the floor dry. Clean up any water that spills on the floor as soon as it happens. Remove soap buildup in the tub or shower regularly. Attach bath mats securely with double-sided non-slip rug tape. Do not have throw rugs and other things on the floor that can make you trip. What can I do in the bedroom? Use night lights. Make sure that you have a light by your bed that is easy to reach. Do not use any sheets or blankets that are too big for your bed. They should not hang down onto the floor. Have a firm chair that has side arms. You can use this for support while you get dressed. Do not have throw rugs and other things on the floor that can make you trip. What can I do in the kitchen? Clean up any spills right away. Avoid walking on wet floors. Keep items that you use a lot in easy-to-reach places. If you need to reach something above you, use a strong step stool that has a grab bar. Keep electrical cords out of the way. Do not use floor polish or wax that makes floors slippery. If you must use wax, use non-skid floor wax. Do not have  throw rugs and other things on the floor that can make you trip. What can I do with my stairs? Do not leave any items on the stairs. Make sure that there are handrails on both sides of the stairs and use them. Fix handrails that are broken or loose. Make sure that handrails are as long as the stairways. Check any carpeting to make sure that it is firmly attached to the stairs. Fix any carpet that is loose or worn. Avoid having throw rugs at the top or bottom of the stairs. If you do have throw rugs, attach them to the floor with carpet tape. Make sure that you have a light switch at the top of the stairs and the bottom of the stairs. If you do not have them, ask someone to add them for you. What else can I do to help prevent falls? Wear shoes that: Do not have high heels. Have rubber bottoms. Are comfortable and fit you well. Are closed at the toe. Do not wear sandals. If you use a stepladder: Make sure that it is fully opened. Do not climb a closed stepladder. Make sure that both sides of the stepladder are locked into place. Ask someone to hold it for you, if possible. Clearly mark and make sure that you can see: Any grab bars or handrails. First and last steps. Where the edge of each step is. Use tools that help you move around (mobility aids) if they are needed. These include: Canes. Walkers. Scooters. Crutches. Turn on the lights when you go into a dark area. Replace any light bulbs as soon as they burn out. Set up your furniture so you have a clear path. Avoid moving your furniture around. If any of your floors are uneven, fix them. If there are any pets around you, be aware of where they are. Review your medicines with your doctor. Some medicines can make you feel dizzy. This can increase your chance of falling. Ask your doctor what other things that you can do to help prevent falls. This information is not intended to replace advice given to you by your health care provider.  Make sure you discuss any questions you have with your health care provider. Document Released:  12/23/2008 Document Revised: 08/04/2015 Document Reviewed: 04/02/2014 Elsevier Interactive Patient Education  2017 [employer].

## 2022-07-09 ENCOUNTER — Ambulatory Visit (INDEPENDENT_AMBULATORY_CARE_PROVIDER_SITE_OTHER): Payer: Medicare HMO

## 2022-07-09 DIAGNOSIS — I639 Cerebral infarction, unspecified: Secondary | ICD-10-CM

## 2022-07-09 LAB — CUP PACEART REMOTE DEVICE CHECK
Date Time Interrogation Session: 20240426230610
Implantable Pulse Generator Implant Date: 20210423

## 2022-07-13 NOTE — Progress Notes (Signed)
[facility]link Summary Report / Loop Recorder 

## 2022-08-07 ENCOUNTER — Encounter: Payer: Medicare HMO | Admitting: Pharmacist

## 2022-08-07 NOTE — Progress Notes (Unsigned)
Care Management & Coordination Services Pharmacy Note  08/07/2022 Name:  Russell Russell MRN:  [id number] DOB:  1950-12-19  Summary: ***  Recommendations/Changes made from today's visit: ***  Follow up plan: ***   Subjective: Russell Russell is an 72 y.o. year old male who is a primary patient of Jarold Motto, [place].  The care coordination team was consulted for assistance with disease management and care coordination needs.    Engaged with patient by telephone for follow up visit.  Recent office visits: ***  Recent consult visits: ***  Hospital visits: {Hospital DC Yes/No:25215}   Objective:  Lab Results  Component Value Date   CREATININE 1.2 11/21/2021   BUN 8 11/21/2021   GFR 66.25 08/17/2021   EGFR 65 11/21/2021   GFRNONAA 59 (L) 01/27/2020   GFRAA 69 01/27/2020   NA 142 11/21/2021   K 3.2 (A) 11/21/2021   CALCIUM 9.4 11/21/2021   CO2 25 (A) 11/21/2021   GLUCOSE 104 (H) 08/17/2021    Lab Results  Component Value Date/Time   HGBA1C 6.2 07/27/2021 08:58 AM   HGBA1C 6.4 04/24/2021 10:32 AM   GFR 66.25 08/17/2021 10:43 AM   GFR 61.66 07/27/2021 08:58 AM    Last diabetic Eye exam: No results found for: "HMDIABEYEEXA"  Last diabetic Foot exam: No results found for: "HMDIABFOOTEX"   Lab Results  Component Value Date   CHOL 136 04/24/2021   HDL 44.20 04/24/2021   LDLCALC 70 04/24/2021   TRIG 112.0 04/24/2021   CHOLHDL 3 04/24/2021       Latest Ref Rng & Units 11/21/2021   12:00 AM 08/17/2021   10:43 AM 07/27/2021    8:58 AM  Hepatic Function  Total Protein 6.0 - 8.3 g/dL  7.2  7.7   Albumin 3.5 - 5.0 3.7     4.1  4.0   AST 0 - 37 U/L  45  57   ALT 0 - 53 U/L  52  50   Alk Phosphatase 39 - 117 U/L  112  109   Total Bilirubin 0.2 - 1.2 mg/dL  0.8  0.9      This result is from an external source.    Lab Results  Component Value Date/Time   TSH 2.209 07/01/2019 03:15 AM   TSH 1.91 02/24/2019 03:05 PM   TSH 2.50 12/18/2017 03:03 PM        Latest Ref Rng & Units 11/21/2021   12:00 AM 08/17/2021   10:43 AM 04/24/2021   10:32 AM  CBC  WBC  4.6     5.2  4.9   Hemoglobin 13.5 - 17.5 12.9     14.7  14.4   Hematocrit 41 - 53 40     44.1  44.5   Platelets 150 - 400 K/uL 227     240.0  241.0      This result is from an external source.    Lab Results  Component Value Date/Time   VD25OH 28 11/21/2021 12:00 AM   VITAMINB12 383 07/01/2019 03:15 AM   VITAMINB12 268 12/18/2017 03:03 PM    Clinical ASCVD: {YES/NO:21197} The ASCVD Risk score (Arnett DK, et al., 2019) failed to calculate for the following reasons:   The patient has a prior MI or stroke diagnosis    ***Other: (CHADS2VASc if Afib, MMRC or CAT for COPD, ACT, DEXA)     07/05/2022    3:07 PM 07/26/2021    8:46 AM 02/17/2021    2:28 PM  Depression screen  PHQ 2/9  Decreased Interest 0 0 0  Down, Depressed, Hopeless 0 0 0  PHQ - 2 Score 0 0 0     Social History   Tobacco Use  Smoking Status Every Day   Packs/day: 0.25   Years: 40.00   Additional pack years: 0.00   Total pack years: 10.00   Types: Cigarettes   Start date: 1974  Smokeless Tobacco Never   BP Readings from Last 3 Encounters:  07/05/22 (!) 128/100  07/04/22 (!) 150/100  01/17/22 (!) 138/96   Pulse Readings from Last 3 Encounters:  07/04/22 83  01/17/22 91  01/12/22 91   Wt Readings from Last 3 Encounters:  07/04/22 184 lb 4 oz (83.6 kg)  01/17/22 173 lb (78.5 kg)  01/12/22 170 lb 4 oz (77.2 kg)   BMI Readings from Last 3 Encounters:  07/04/22 26.44 kg/m  01/17/22 24.82 kg/m  01/12/22 24.43 kg/m    Allergies  Allergen Reactions   Penicillins Rash    Medications Reviewed Today     Reviewed by Marzella Schlein, LPN (Licensed Practical Nurse) on 07/05/22 at 1505  Med List Status: <None>   Medication Order Taking? Sig Documenting Provider Last Dose Status Informant  acetaminophen (TYLENOL) 325 MG tablet [id number] Yes Take 2 tablets (650 mg total) by mouth every 4 (four) hours  as needed for mild pain (or temp > 37.5 C (99.5 F)). Charlton Amor, PA-C Taking Active   atorvastatin (LIPITOR) 40 MG tablet [id number] Yes TAKE 1 TABLET BY MOUTH EVERY DAY [place], [place], [place] Taking Active   clotrimazole (LOTRIMIN) 1 % cream [id number]   Victorino Sparrow, MD  Active   folic acid (FOLVITE) 1 MG tablet [id number] Yes TAKE 1 TABLET BY MOUTH EVERY DAY Jarold Motto, [place] Taking Active   metoprolol succinate (TOPROL XL) 25 MG 24 hr tablet [id number] Yes Take 1 tablet (25 mg total) by mouth at bedtime. Graciella Freer, PA-C Taking Active   Multiple Vitamin (MULTIVITAMIN WITH MINERALS) TABS tablet [id number] Yes Take 1 tablet by mouth daily. Charlton Amor, PA-C Taking Active   nicotine polacrilex (COMMIT) 4 MG lozenge [id number] No Take 1 lozenge (4 mg total) by mouth as needed for smoking cessation.  Patient not taking: Reported on 07/05/2022   Jarold Motto, [place] Not Taking Active            Med Note Shelby Dubin   Wed Jan 17, 2022  9:23 AM) HAS NOT PICKED UP RX YET  omeprazole (PRILOSEC) 20 MG capsule [id number] Yes Take 20 mg by mouth as needed (acid reflux). [provider] Taking Active Spouse/Significant Other  pantoprazole (PROTONIX) 40 MG tablet [id number] Yes Take 1 tablet (40 mg total) by mouth daily. Protonix 40 mg daily for 4 weeks. Cirigliano, Vito V, DO Taking Active   rivaroxaban (XARELTO) 20 MG TABS tablet [id number] Yes TAKE 1 TABLET EVERY DAY WITH SUPPER. Mealor, Roberts Gaudy, MD Taking Active             SDOH:  (Social Determinants of Health) assessments and interventions performed: {yes/no:20286} SDOH Interventions    Flowsheet Row Clinical Support from 07/05/2022 in [facility]  SDOH Interventions   Food Insecurity Interventions Intervention Not Indicated  Housing Interventions Intervention Not Indicated  Transportation Interventions Intervention Not Indicated  Utilities Interventions Intervention Not  Indicated  Financial Strain Interventions Intervention Not Indicated  Physical Activity Interventions Intervention Not Indicated  Stress Interventions Intervention Not Indicated  Social Connections Interventions Intervention Not  Indicated       Medication Assistance: {MEDASSISTANCEINFO:25044}  Medication Access: Name and location of current pharmacy:  [id number] - [place], [place] - [address] [place] [address] Phone: [phone] Fax: [phone]  [facility] - [place], [place] - [address] [place] [address] Phone: [phone] Fax: [phone]  [id number] [place], [place] - [address] [place] [address] Phone: [phone] Fax: [phone]  Within the past 30 days, how often has patient missed a dose of medication? *** Is a pillbox or other method used to improve adherence? {YES/NO:21197} Factors that may affect medication adherence? {CHL DESC; BARRIERS:21522} Are meds synced by current pharmacy? {YES/NO:21197} Are meds delivered by current pharmacy? {YES/NO:21197} Does patient experience delays in picking up medications due to transportation concerns? {YES/NO:21197}  Compliance/Adherence/Medication fill history: Care Gaps: ***  Star-Rating Drugs: ***   Assessment/Plan     Hypertension (BP goal <130/80) -Controlled -Current treatment: Metoprolol tartrate 25mg  daily Appropriate, Effective, Safe, Accessible -Medications previously tried: amlodipine, lisinopril -Current home readings: "normal" -Current dietary habits: regular diet, no specific restrictions -Current exercise habits: minimal -Denies hypotensive/hypertensive symptoms -Educated on BP goals and benefits of medications for prevention of heart attack, stroke and kidney damage; Daily salt intake goal < 2300 mg; Exercise  goal of 150 minutes per week; -Counseled to monitor BP at home a few times per week , document, and provide log at future appointments -.  Hyperlipidemia: (LDL goal < 70, possible < 55) -Controlled -Current treatment: Atorvastatin 40mg  Appropriate, Effective, Safe, Accessible  -Medications previously tried: none noted   -Educated on Cholesterol goals;  Benefits of statin for ASCVD risk reduction; Importance of limiting foods high in cholesterol; -Recommended to continue current medication Tolerating well, LDL is currently 70.  Could treat to goal < 55 given his history, we will continue medication for now.  Pre-Diabetes (A1c goal <6.5%) -Controlled -Current medications: None noted -Medications previously tried: none ntoed  -Current home glucose readings fasting glucose: not checking  post prandial glucose: not checking -Denies hypoglycemic/hyperglycemic symptoms -Educated on A1c and blood sugar goals; -Counseled to check feet daily and get yearly eye exams -Counseled on diet and exercise extensively Recommended to continue current medication  Hx of Stroke/CVA (Goal: Reduce CV risk) -Controlled -Current treatment  Xarelto 20mg  Appropriate, Effective, Safe, Accessible -Medications previously tried: none noted -Discussed cost barrier of Xarelto, especially as they enter the donut hole.  Unfortunately there is no option for Xarelto besides the [place] at this time which will only benefit them when he is in donut hole.  Gave them my direct line, instructed to call me if he gets into donut hole again.  Could provide samples or assist with application for final month of the year.  -Recommended to continue current medication        Willa Frater, PharmD Clinical Pharmacist  [facility] [phone]

## 2022-08-08 ENCOUNTER — Ambulatory Visit: Payer: Medicare HMO | Admitting: Pharmacist

## 2022-08-08 ENCOUNTER — Ambulatory Visit (INDEPENDENT_AMBULATORY_CARE_PROVIDER_SITE_OTHER): Payer: Medicare HMO

## 2022-08-08 DIAGNOSIS — I639 Cerebral infarction, unspecified: Secondary | ICD-10-CM

## 2022-08-08 NOTE — Progress Notes (Signed)
Care Management & Coordination Services Pharmacy Note  08/08/2022 Name:  Russell Russell MRN:  [id number] DOB:  03/14/1950  Summary: PharmD FU visit BP and adherence. Adherent with atorvastatin so far.  BP has been up and down.  Reports normal but no logs.  I have asked her to record for two weeks and will have CMA call to check in  Recommendations/Changes made from today's visit: Record BP for 2 weeks, call us if elevated  Follow up plan: Fu 2 week with BP check PharmD in 6 months   Subjective: Russell Russell is an 72 y.o. year old male who is a primary patient of Jarold Motto, [place].  The care coordination team was consulted for assistance with disease management and care coordination needs.    Engaged with patient by telephone for follow up visit. Recent office visits:  01/12/2022 OV (Fam Med) Jarold Motto, [place]; will trial oral prednisone x 2 weeks.    12/26/2021 OV (Fam Med) Jarold Motto, PA; Trial Sarna Lotion   08/31/2021 OV (Fam Med) Jarold Motto, PA; no medication changes indicated.   08/17/2021 OV (Fam Med) Jarold Motto, PA; no medication changes indicated.   Recent consult visits:  01/17/2022 OV (Cardiology) Graciella Freer, PA-C; no medication changes.   12/29/2021 OV (Vasc Surg) Victorino Sparrow, MD; I have prescribed some Lotrimin    10/24/2021 OV (Cardiology) Joylene Grapes, NP; no medication changes noted.   10/18/2021 OV (Gastro) Cirigliano, Vito V, DO; no medication changes noted.   08/25/2021 OV (Pulmonology) Janeann Forehand D, NP; no medication changes noted.   Hospital visits:  01/06/2022 ED visit for Rash  Patient was given dose of steroids.  Will add antihistamines.  Continue with the Lotrimin    Objective:  Lab Results  Component Value Date   CREATININE 1.2 11/21/2021   BUN 8 11/21/2021   GFR 66.25 08/17/2021   EGFR 65 11/21/2021   GFRNONAA 59 (L) 01/27/2020   GFRAA 69 01/27/2020   NA 142 11/21/2021   K 3.2 (A)  11/21/2021   CALCIUM 9.4 11/21/2021   CO2 25 (A) 11/21/2021   GLUCOSE 104 (H) 08/17/2021    Lab Results  Component Value Date/Time   HGBA1C 6.2 07/27/2021 08:58 AM   HGBA1C 6.4 04/24/2021 10:32 AM   GFR 66.25 08/17/2021 10:43 AM   GFR 61.66 07/27/2021 08:58 AM    Last diabetic Eye exam: No results found for: "HMDIABEYEEXA"  Last diabetic Foot exam: No results found for: "HMDIABFOOTEX"   Lab Results  Component Value Date   CHOL 136 04/24/2021   HDL 44.20 04/24/2021   LDLCALC 70 04/24/2021   TRIG 112.0 04/24/2021   CHOLHDL 3 04/24/2021       Latest Ref Rng & Units 11/21/2021   12:00 AM 08/17/2021   10:43 AM 07/27/2021    8:58 AM  Hepatic Function  Total Protein 6.0 - 8.3 g/dL  7.2  7.7   Albumin 3.5 - 5.0 3.7     4.1  4.0   AST 0 - 37 U/L  45  57   ALT 0 - 53 U/L  52  50   Alk Phosphatase 39 - 117 U/L  112  109   Total Bilirubin 0.2 - 1.2 mg/dL  0.8  0.9      This result is from an external source.    Lab Results  Component Value Date/Time   TSH 2.209 07/01/2019 03:15 AM   TSH 1.91 02/24/2019 03:05 PM   TSH 2.50 12/18/2017 03:03  PM       Latest Ref Rng & Units 11/21/2021   12:00 AM 08/17/2021   10:43 AM 04/24/2021   10:32 AM  CBC  WBC  4.6     5.2  4.9   Hemoglobin 13.5 - 17.5 12.9     14.7  14.4   Hematocrit 41 - 53 40     44.1  44.5   Platelets 150 - 400 K/uL 227     240.0  241.0      This result is from an external source.    Lab Results  Component Value Date/Time   VD25OH 28 11/21/2021 12:00 AM   VITAMINB12 383 07/01/2019 03:15 AM   VITAMINB12 268 12/18/2017 03:03 PM    Clinical ASCVD: Yes  The ASCVD Risk score (Arnett DK, et al., 2019) failed to calculate for the following reasons:   The patient has a prior MI or stroke diagnosis        07/05/2022    3:07 PM 07/26/2021    8:46 AM 02/17/2021    2:28 PM  Depression screen PHQ 2/9  Decreased Interest 0 0 0  Down, Depressed, Hopeless 0 0 0  PHQ - 2 Score 0 0 0     Social History   Tobacco Use   Smoking Status Every Day   Packs/day: 0.25   Years: 40.00   Additional pack years: 0.00   Total pack years: 10.00   Types: Cigarettes   Start date: 1974  Smokeless Tobacco Never   BP Readings from Last 3 Encounters:  07/05/22 (!) 128/100  07/04/22 (!) 150/100  01/17/22 (!) 138/96   Pulse Readings from Last 3 Encounters:  07/04/22 83  01/17/22 91  01/12/22 91   Wt Readings from Last 3 Encounters:  07/04/22 184 lb 4 oz (83.6 kg)  01/17/22 173 lb (78.5 kg)  01/12/22 170 lb 4 oz (77.2 kg)   BMI Readings from Last 3 Encounters:  07/04/22 26.44 kg/m  01/17/22 24.82 kg/m  01/12/22 24.43 kg/m    Allergies  Allergen Reactions   Penicillins Rash    Medications Reviewed Today     Reviewed by Erroll Luna, [facility] (Pharmacist) on 08/08/22 at 1521  Med List Status: <None>   Medication Order Taking? Sig Documenting Provider Last Dose Status Informant  acetaminophen (TYLENOL) 325 MG tablet [id number] No Take 2 tablets (650 mg total) by mouth every 4 (four) hours as needed for mild pain (or temp > 37.5 C (99.5 F)). Charlton Amor, PA-C Taking Active   atorvastatin (LIPITOR) 40 MG tablet [id number] No TAKE 1 TABLET BY MOUTH EVERY DAY [place], [place], [place] Taking Active   clotrimazole (LOTRIMIN) 1 % cream [id number]   Victorino Sparrow, MD  Active   folic acid (FOLVITE) 1 MG tablet [id number] No TAKE 1 TABLET BY MOUTH EVERY DAY Jarold Motto, [place] Taking Active   metoprolol succinate (TOPROL XL) 25 MG 24 hr tablet [id number] No Take 1 tablet (25 mg total) by mouth at bedtime. Graciella Freer, PA-C Taking Active   Multiple Vitamin (MULTIVITAMIN WITH MINERALS) TABS tablet [id number] No Take 1 tablet by mouth daily. Charlton Amor, PA-C Taking Active   nicotine polacrilex (COMMIT) 4 MG lozenge [id number] No Take 1 lozenge (4 mg total) by mouth as needed for smoking cessation.  Patient not taking: Reported on 07/05/2022   Jarold Motto, [place] Not Taking Active            Med  Note Natale Milch, [place] R  Wed Jan 17, 2022  9:23 AM) HAS NOT PICKED UP RX YET  omeprazole (PRILOSEC) 20 MG capsule [id number] No Take 20 mg by mouth as needed (acid reflux). [provider] Taking Active Spouse/Significant Other  rivaroxaban (XARELTO) 20 MG TABS tablet [id number] No TAKE 1 TABLET EVERY DAY WITH SUPPER. Mealor, Roberts Gaudy, MD Taking Active             SDOH:  (Social Determinants of Health) assessments and interventions performed: No, done within year Financial Resource Strain: Low Risk  (07/05/2022)   Overall Financial Resource Strain (CARDIA)    Difficulty of Paying Living Expenses: Not hard at all   Food Insecurity: No Food Insecurity (07/05/2022)   Hunger Vital Sign    Worried About Running Out of Food in the Last Year: Never true    Ran Out of Food in the Last Year: Never true    SDOH Interventions    Flowsheet Row Clinical Support from 07/05/2022 in [facility]  SDOH Interventions   Food Insecurity Interventions Intervention Not Indicated  Housing Interventions Intervention Not Indicated  Transportation Interventions Intervention Not Indicated  Utilities Interventions Intervention Not Indicated  Financial Strain Interventions Intervention Not Indicated  Physical Activity Interventions Intervention Not Indicated  Stress Interventions Intervention Not Indicated  Social Connections Interventions Intervention Not Indicated       Medication Assistance: None required.  Patient affirms current coverage meets needs.  Medication Access: Name and location of current pharmacy:  [id number] - [place], [place] - [address] [place] [address] Phone: [phone] Fax: [phone]  [facility] - [place], [place] - [address] [place] [address] Phone: [phone] Fax: [phone]  [id number] [place], [place] - [address] [place] [address] Phone: [phone] Fax: [phone]  Within the past 30 days, how often has patient missed a dose of medication? 0 Is a pillbox or other method used to improve adherence? No  Factors that may affect medication adherence? no barriers identified Are meds synced by current pharmacy? No  Are meds delivered by current pharmacy? No  Does patient experience delays in picking up medications due to transportation concerns? No   Compliance/Adherence/Medication fill history: Care Gaps: None  Star-Rating Drugs: 05/20/22 90ds Atorvastatin 40mg    Assessment/Plan                   Hypertension (BP goal <130/80) 08/08/22 -Controlled, based on home reports and most recent in office -Current treatment: Metoprolol tartrate 25mg  daily Appropriate, Effective, Safe, Accessible -Medications previously tried: amlodipine, lisinopril -Current home readings: "normal" -Current dietary habits: regular diet, no specific restrictions -Current exercise habits: minimal -Denies hypotensive/hypertensive symptoms -Educated on BP goals and benefits of medications for prevention of heart attack, stroke and kidney damage; Daily salt intake goal < 2300 mg; -Wife has monitored at home but did not have logs.  Discussed BP goals, advised her to check a few times per week and record them in a log. I will have HC call in 2 weeks to discuss home readings.  If above goal may need to look at resuming treatment to meat BP goal in patient with his history. -.  Hyperlipidemia: (LDL goal < 70, possible < 55) -Controlled -Current treatment: Atorvastatin 40mg  Appropriate, Effective, Safe, Accessible  -Medications previously tried: none noted   -Educated on Cholesterol goals;  Benefits of statin for ASCVD risk reduction; Importance of limiting foods high in cholesterol; -Recommended to continue current medication Tolerating  well, LDL is currently 70.  Could treat to goal < 55 given his history, we will continue medication for now.  Pre-Diabetes (A1c goal <6.5%) -Controlled -Current medications: None noted -Medications previously tried: none ntoed  -Current home glucose readings fasting glucose: not checking  post prandial glucose: not checking -Denies hypoglycemic/hyperglycemic symptoms -Educated on A1c and blood sugar goals; -Counseled to check feet daily and get yearly eye exams -Counseled on diet and exercise extensively Recommended to continue current medication  Hx of Stroke/CVA (Goal: Reduce CV risk) -Controlled -Current treatment  Xarelto 20mg  Appropriate, Effective, Safe, Accessible -Medications previously tried: none noted -Discussed cost barrier of Xarelto, especially as they enter the donut hole.  Unfortunately there is no option for Xarelto besides the [place] at this time which will only benefit them when he is in donut hole.  Gave them my direct line, instructed to call me if he gets into donut hole again.  Could provide samples or assist with application for final month of the year.  -Recommended to continue current medication        Willa Frater, PharmD Clinical Pharmacist  [facility] [phone]

## 2022-08-09 LAB — CUP PACEART REMOTE DEVICE CHECK
Date Time Interrogation Session: 20240529231222
Implantable Pulse Generator Implant Date: 20210423

## 2022-08-09 NOTE — Progress Notes (Signed)
[facility]link Summary Report / Loop Recorder 

## 2022-08-28 ENCOUNTER — Encounter: Payer: Self-pay | Admitting: Physician Assistant

## 2022-08-29 ENCOUNTER — Other Ambulatory Visit: Payer: Self-pay | Admitting: Physician Assistant

## 2022-08-29 DIAGNOSIS — R2689 Other abnormalities of gait and mobility: Secondary | ICD-10-CM

## 2022-08-29 NOTE — Telephone Encounter (Signed)
Please advise if you would like to see patient or what you recommend. Thanks

## 2022-08-30 ENCOUNTER — Ambulatory Visit (HOSPITAL_BASED_OUTPATIENT_CLINIC_OR_DEPARTMENT_OTHER)
Admission: RE | Admit: 2022-08-30 | Discharge: 2022-08-30 | Disposition: A | Discharge status: Home / Self Care | Payer: Medicare HMO | Source: Ambulatory Visit | Attending: Physician Assistant | Admitting: Physician Assistant

## 2022-08-30 DIAGNOSIS — Z87891 Personal history of nicotine dependence: Secondary | ICD-10-CM | POA: Diagnosis not present

## 2022-08-30 DIAGNOSIS — F17210 Nicotine dependence, cigarettes, uncomplicated: Secondary | ICD-10-CM | POA: Diagnosis not present

## 2022-08-30 DIAGNOSIS — Z122 Encounter for screening for malignant neoplasm of respiratory organs: Secondary | ICD-10-CM | POA: Insufficient documentation

## 2022-08-30 NOTE — Progress Notes (Signed)
[facility]link Summary Report / Loop Recorder 

## 2022-08-31 ENCOUNTER — Ambulatory Visit (INDEPENDENT_AMBULATORY_CARE_PROVIDER_SITE_OTHER): Payer: Medicare HMO

## 2022-08-31 ENCOUNTER — Other Ambulatory Visit: Payer: Self-pay

## 2022-08-31 ENCOUNTER — Encounter: Payer: Self-pay | Admitting: Family Medicine

## 2022-08-31 ENCOUNTER — Ambulatory Visit: Payer: Medicare HMO | Admitting: Family Medicine

## 2022-08-31 VITALS — BP 126/88 | HR 73 | Ht 70.0 in | Wt 178.0 lb

## 2022-08-31 DIAGNOSIS — R29898 Other symptoms and signs involving the musculoskeletal system: Secondary | ICD-10-CM

## 2022-08-31 DIAGNOSIS — Z122 Encounter for screening for malignant neoplasm of respiratory organs: Secondary | ICD-10-CM

## 2022-08-31 DIAGNOSIS — R269 Unspecified abnormalities of gait and mobility: Secondary | ICD-10-CM

## 2022-08-31 DIAGNOSIS — I6340 Cerebral infarction due to embolism of unspecified cerebral artery: Secondary | ICD-10-CM | POA: Diagnosis not present

## 2022-08-31 DIAGNOSIS — F17210 Nicotine dependence, cigarettes, uncomplicated: Secondary | ICD-10-CM

## 2022-08-31 DIAGNOSIS — M545 Low back pain, unspecified: Secondary | ICD-10-CM | POA: Diagnosis not present

## 2022-08-31 DIAGNOSIS — Z87891 Personal history of nicotine dependence: Secondary | ICD-10-CM

## 2022-08-31 NOTE — Progress Notes (Signed)
I, Stevenson Clinch, CMA acting as a scribe for Russell Graham, MD.  Russell Russell is a 72 y.o. male who presents to [facility] today for gait abnormalities. Hx of cerebral infarction and cryptogenic stroke. Today patient reports weakness in the left leg, has started dragging the leg when walking. In wheelchair today, also ambulates with a cane. No longer in physical therapy. No longer doing the home exercises.  He has a history of stroke causing left-sided weakness.  He had recovered after doing neurorehab but has had a setback recently.  Pertinent review of systems: No fevers or chills  Relevant historical information: History of stroke causing left arm and leg weakness in 2021.   Exam:  BP 126/88   Pulse 73   Ht 5\' 10"  (1.778 m)   Wt 178 lb (80.7 kg)   SpO2 99%   BMI 25.54 kg/m  General: Well Developed, well nourished, and in no acute distress.  Seated in a wheelchair.  MSK: Left leg: Decreased strength hip flexion 4/5.  Hip abduction and adduction 5/5.  Knee extension 4+/5 knee flexion 5/5 foot dorsiflexion and plantarflexion 5/5. Right leg strength is intact. Reflexes are intact. Sensation is intact.  Gait: Using a cane to ambulate slow gait with foot dragging and limited hip flexion.  Gait is broad-based.    Lab and Radiology Results  X-ray images lumbar spine obtained today personally and independently interpreted DDD present L5-S1 and L4-L5.  No acute fractures are visible. Await formal radiology review   Assessment and Plan: 72 y.o. male with worsening left lower extremity strength and worsening gait.  This occurs in the setting of a stroke causing left-sided weakness.  He has had a change in his baseline.  This could be due to lumbar radiculopathy or perhaps a second stroke. Plan for referral back to neuro rehab physical therapy.  Additionally will obtain a new brain MRI to assess for the possibility of recurrent stroke.   PDMP not  reviewed this encounter. Orders Placed This Encounter  Procedures   MR BRAIN WO CONTRAST    Standing Status:   Future    Standing Expiration Date:   08/31/2023    Order Specific Question:   What is the patient's sedation requirement?    Answer:   No Sedation    Order Specific Question:   Does the patient have a pacemaker or implanted devices?    Answer:   Yes    Order Specific Question:   Manufacturer of pacemake or implanted device?    Answer:   Loop recorder [id number] [employer]    Order Specific Question:   Preferred imaging location?    Answer:   [facility]   DG Lumbar Spine 2-3 Views    Standing Status:   Future    Number of Occurrences:   1    Standing Expiration Date:   09/30/2022    Order Specific Question:   Reason for Exam (SYMPTOM  OR DIAGNOSIS REQUIRED)    Answer:   low back pain    Order Specific Question:   Preferred imaging location?    Answer:   [facility]   Ambulatory referral to Physical Therapy    Referral Priority:   Routine    Referral Type:   Physical Medicine    Referral Reason:   Specialty Services Required    Requested Specialty:   Physical Therapy    Number of Visits Requested:   1   No orders  of the defined types were placed in this encounter.    Discussed warning signs or symptoms. Please see discharge instructions. Patient expresses understanding.   The above documentation has been reviewed and is accurate and complete Russell Russell, M.D.

## 2022-08-31 NOTE — Patient Instructions (Addendum)
Thank you for coming in today.   Please get an Xray today before you leave   I've referred you to Neuro-Rehab.  Let us know if you don't hear from them in one week.   You should hear from MRI scheduling within 1 week. If you do not hear please let me know.

## 2022-09-06 ENCOUNTER — Ambulatory Visit (HOSPITAL_BASED_OUTPATIENT_CLINIC_OR_DEPARTMENT_OTHER)
Admission: RE | Admit: 2022-09-06 | Discharge: 2022-09-06 | Disposition: A | Discharge status: Home / Self Care | Payer: Medicare HMO | Source: Ambulatory Visit | Attending: Family Medicine | Admitting: Family Medicine

## 2022-09-06 DIAGNOSIS — R9089 Other abnormal findings on diagnostic imaging of central nervous system: Secondary | ICD-10-CM | POA: Diagnosis not present

## 2022-09-06 DIAGNOSIS — Z8673 Personal history of transient ischemic attack (TIA), and cerebral infarction without residual deficits: Secondary | ICD-10-CM | POA: Diagnosis not present

## 2022-09-06 DIAGNOSIS — I6340 Cerebral infarction due to embolism of unspecified cerebral artery: Secondary | ICD-10-CM | POA: Insufficient documentation

## 2022-09-06 DIAGNOSIS — G459 Transient cerebral ischemic attack, unspecified: Secondary | ICD-10-CM | POA: Diagnosis not present

## 2022-09-06 NOTE — Progress Notes (Signed)
Lumbar spine x-ray shows a little bit of arthritis.

## 2022-09-10 ENCOUNTER — Ambulatory Visit (INDEPENDENT_AMBULATORY_CARE_PROVIDER_SITE_OTHER): Payer: Medicare HMO

## 2022-09-10 DIAGNOSIS — I639 Cerebral infarction, unspecified: Secondary | ICD-10-CM

## 2022-09-11 LAB — CUP PACEART REMOTE DEVICE CHECK
Date Time Interrogation Session: 20240701231025
Implantable Pulse Generator Implant Date: 20210423

## 2022-09-17 ENCOUNTER — Ambulatory Visit: Payer: Medicare HMO | Attending: Family Medicine | Admitting: Physical Therapy

## 2022-09-17 VITALS — BP 132/96 | HR 85

## 2022-09-17 DIAGNOSIS — R269 Unspecified abnormalities of gait and mobility: Secondary | ICD-10-CM | POA: Diagnosis not present

## 2022-09-17 DIAGNOSIS — M6281 Muscle weakness (generalized): Secondary | ICD-10-CM | POA: Insufficient documentation

## 2022-09-17 DIAGNOSIS — R29898 Other symptoms and signs involving the musculoskeletal system: Secondary | ICD-10-CM | POA: Insufficient documentation

## 2022-09-17 DIAGNOSIS — R262 Difficulty in walking, not elsewhere classified: Secondary | ICD-10-CM | POA: Diagnosis not present

## 2022-09-17 DIAGNOSIS — R2681 Unsteadiness on feet: Secondary | ICD-10-CM | POA: Insufficient documentation

## 2022-09-17 DIAGNOSIS — R2689 Other abnormalities of gait and mobility: Secondary | ICD-10-CM | POA: Diagnosis not present

## 2022-09-17 NOTE — Therapy (Signed)
OUTPATIENT PHYSICAL THERAPY NEURO EVALUATION   Patient Name: Russell Russell MRN: [id number] DOB:1950/07/18, 72 y.o., male Today's Date: 09/17/2022   PCP: Jarold Motto, PA REFERRING PROVIDER: Rodolph Bong, MD  END OF SESSION:  PT End of Session - 09/17/22 1022     Visit Number 1    Number of Visits 13   Plus eval   Date for PT Re-Evaluation 11/12/22   Due to delay in scheduling   Authorization Type [employer]    Progress Note Due on Visit 10    PT Start Time 1019    PT Stop Time 1057    PT Time Calculation (min) 38 min    Activity Tolerance Patient tolerated treatment well    Behavior During Therapy WFL for tasks assessed/performed             Past Medical History:  Diagnosis Date   Alcohol abuse    drinks 1 gallon of Gin every weekend, nothing during the week x >15 yrs   Atrial fibrillation (HCC)    Elevated transaminase level    +elev bili: abd u/s 06/2014 showed stable small hepatic hemangiomas, o/w normal.   Essential hypertension    GERD (gastroesophageal reflux disease)    Hyperlipidemia 03/2014   Atorv started-chol improved   Hypertension    Impaired fasting glucose 03/2014   Nephrolithiasis    PUD (peptic ulcer disease)    Stroke (HCC) 2021   Tobacco dependence    chantix: "psych effects"   Past Surgical History:  Procedure Laterality Date   COLONOSCOPY  2005   Recall 10 yrs ([place])   LOOP RECORDER INSERTION N/A 07/03/2019   Procedure: LOOP RECORDER INSERTION;  Surgeon: Hillis Range, MD;  Location: [facility];  Service: Cardiovascular;  Laterality: N/A;   removal of kidney stones  2006   cystoscopic--removed from ureter.  No prob since.   Patient Active Problem List   Diagnosis Date Noted   Renal structural abnormality 08/24/2021   H/O ETOH abuse 04/28/2020   History of tobacco abuse 01/07/2020   Hemiparesis affecting left side as late effect of stroke (HCC) 01/07/2020   Abnormality of gait 09/24/2019   AKI (acute kidney  injury) (HCC)    Fever    Sepsis without acute organ dysfunction (HCC)    Elevated BUN    Blood pressure increase diastolic    Labile blood pressure    Leukopenia    Benign essential HTN    Hypoalbuminemia    Cerebrovascular accident (CVA) of right basal ganglia (HCC) 07/07/2019   Acute ischemic stroke (HCC)    Dyslipidemia    Dysphagia, post-stroke    Polycythemia    Stroke (HCC) 06/30/2019   Prediabetes 12/23/2016   Elevated LFTs 12/23/2016   Pure hypercholesterolemia 12/21/2016   Alcoholism (HCC) 12/21/2016   Eczema 09/18/2013   Essential hypertension 09/18/2013   Gastroesophageal reflux disease without esophagitis 09/18/2013   Tobacco abuse 09/18/2013    ONSET DATE: 08/31/2022 (referral)  REFERRING DIAG: R29.898 (ICD-10-CM) - Weakness of left lower extremity R26.9 (ICD-10-CM) - Abnormality of gait  THERAPY DIAG:  Unsteadiness on feet  Muscle weakness (generalized)  Other abnormalities of gait and mobility  Rationale for Evaluation and Treatment: Rehabilitation  SUBJECTIVE:  SUBJECTIVE STATEMENT: Pt presents to clinic w/wife, Russell Russell. Using RW. Wife reports pt started dragging his L leg a couple months ago. Has had LLE weakness ever since his stroke several years ago but wife noticed pt dragging his leg more recently. Denies falls. Pt sedentary at home. Has an AFO but stopped using it shortly after completing therapy 2 years ago.   Pt accompanied by:  Wife, Russell Russell  PERTINENT HISTORY: cerebral infarction and cryptogenic stroke.  PAIN:  Are you having pain? No  PRECAUTIONS: Fall  WEIGHT BEARING RESTRICTIONS: No  FALLS: Has patient fallen in last 6 months? No  LIVING ENVIRONMENT: Lives with: lives with their spouse Lives in: House/apartment Stairs: Yes: External: 3 steps; on  right going up, on left going up, and can reach both Has following equipment at home: Quad cane small base, Walker - 2 wheeled, shower chair, and L AFO  PLOF: Needs assistance with homemaking  PATIENT GOALS: "Just strengthen this leg"   OBJECTIVE:   DIAGNOSTIC FINDINGS: MRI of brain from 09/06/22  IMPRESSION: No acute intracranial process. No evidence of acute or subacute infarct.  MRI of brain from 06/2019 IMPRESSION: 1. Multiple scattered acute ischemic infarcts involving the posterior right basal ganglia, right cerebral hemisphere, and right cerebellum as above. No associated hemorrhage or mass effect. 2. Underlying atrophy with advanced chronic microvascular ischemic disease, with multiple remote lacunar infarcts about the bilateral basal ganglia, thalami, and pons, with additional chronic left cerebellar infarct.  COGNITION: Overall cognitive status: Within functional limits for tasks assessed   SENSATION: Pt denies numbness/tingling in his legs    POSTURE: rounded shoulders, forward head, and increased thoracic kyphosis  LOWER EXTREMITY MMT:  Tested in seated position   MMT Right Eval Left Eval  Hip flexion 4+ 4  Hip extension    Hip abduction 4+ 4  Hip adduction 4+ 4  Hip internal rotation    Hip external rotation    Knee flexion 5 4+  Knee extension 5 4-  Ankle dorsiflexion 5 4  Ankle plantarflexion    Ankle inversion    Ankle eversion    (Blank rows = not tested)  BED MOBILITY:  Independent per pt   TRANSFERS: Assistive device utilized: Environmental consultant - 2 wheeled  Sit to stand: SBA Stand to sit: SBA Pt pushes to stand w/RUE and shifts weight to RLE, very little weight acceptance on LLE   GAIT: Gait pattern:  L knee extension thrust, step through pattern, decreased stride length, decreased hip/knee flexion- Left, decreased ankle dorsiflexion- Left, and poor foot clearance- Left Distance walked: Various clinic distances  Assistive device utilized: Environmental consultant  - 2 wheeled Level of assistance: SBA Comments: Pt frequently tripping over LLE but also noted catching of RLE due to decreased weight shift to R side   FUNCTIONAL TESTS:   [facility] PT Assessment - 09/17/22 1040       Transfers   Five time sit to stand comments  28.62s w/BUE support   Significant weight shift to R side     Ambulation/Gait   Gait velocity 32.8' over 32.69s = 1.0 ft/s w/RW      Balance   Balance Assessed Yes      Standardized Balance Assessment   Standardized Balance Assessment Timed Up and Go Test      Timed Up and Go Test   Normal TUG (seconds) 37.32   w/RW             VITALS  Vitals:   09/17/22 1033  BP: Marland Kitchen)  132/96  Pulse: 85      TODAY'S TREATMENT:         Next Session                                                                                                                          PATIENT EDUCATION: Education details: POC, eval findings, bringing AFO to next session and wearing shorts to trial Bioness  Person educated: Patient and Spouse Education method: Medical illustrator Education comprehension: verbalized understanding  HOME EXERCISE PROGRAM: To be established   GOALS: Goals reviewed with patient? Yes  SHORT TERM GOALS: Target date: 10/15/2022    Pt will be independent with initial HEP for improved strength, balance, transfers and gait.  Baseline: not established on eval  Goal status: INITIAL  2.  Pt will improve normal TUG to less than or equal to 30 seconds w/LRAD and SBA for improved functional mobility and decreased fall risk.  Baseline: 37.32s w/RW and SBA Goal status: INITIAL  3.  Pt will improve 5 x STS to less than or equal to 25 seconds w/equal weightbearing through BLEs to demonstrate improved functional strength and transfer efficiency.   Baseline: 28.62s w/weight through RLE mostly  Goal status: INITIAL  4.  Pt will improve gait velocity to at least 1.4 ft/s w/LRAD for improved gait efficiency    Baseline: 1.0 ft/s w/RW Goal status: INITIAL  5.  Pt will trial various braces in clinic to determine safest and most efficient option for pt to improve independence w/gait.  Baseline: Has AFO- is to bring to first session  Goal status: INITIAL  LONG TERM GOALS: Target date: 10/29/2022    Pt will be independent with final HEP for improved strength, balance, transfers and gait.  Baseline:  Goal status: INITIAL  2.  Pt will improve gait velocity to at least 1.9 ft/s w/LRAD for improved gait efficiency and reduced fall risk  Baseline: 1.0 ft/s w/RW  Goal status: INITIAL  3.  Pt will improve 5 x STS to less than or equal to 20 w/equal BLE weight acceptance seconds to demonstrate improved functional strength and transfer efficiency.   Baseline: 28.62s w/weight through RLE mostly  Goal status: INITIAL  4.  Pt will improve normal TUG to less than or equal to 25 seconds w/LRAD for improved functional mobility and decreased fall risk.  Baseline: 37.32s w/RW and SBA Goal status: INITIAL   ASSESSMENT:  CLINICAL IMPRESSION: Patient is a 72 year old male referred to Neuro OPPT for LLE weakness. Pt's PMH is significant for: alcohol abuse, a-fib, HTN, and GERD. The following deficits were present during the exam: decreased LLE strength, impaired balance, reduced activity tolerance and decreased L step clearance w/gait. Based on gait speed and TUG, pt is a high fall risk. Pt would benefit from skilled PT to address these impairments and functional limitations to maximize functional mobility independence.    OBJECTIVE IMPAIRMENTS: Abnormal gait, decreased activity tolerance, decreased balance, decreased endurance, decreased  mobility, difficulty walking, and decreased strength  ACTIVITY LIMITATIONS: carrying, lifting, squatting, stairs, transfers, and locomotion level  PARTICIPATION LIMITATIONS: meal prep, cleaning, laundry, medication management, driving, shopping, community activity, and  yard work  PERSONAL FACTORS: Age, Fitness, Past/current experiences, and 1 comorbidity: cryptogenic stroke  are also affecting patient's functional outcome.   REHAB POTENTIAL: Fair due to chronicity of CVA and LLE weakness  CLINICAL DECISION MAKING: Stable/uncomplicated  EVALUATION COMPLEXITY: Low  PLAN:  PT FREQUENCY: 1-2x/week  PT DURATION: 6 weeks  PLANNED INTERVENTIONS: Therapeutic exercises, Therapeutic activity, Neuromuscular re-education, Balance training, Gait training, Patient/Family education, Self Care, Joint mobilization, Stair training, Orthotic/Fit training, DME instructions, Aquatic Therapy, Dry Needling, Manual therapy, and Re-evaluation  PLAN FOR NEXT SESSION: Did pt bring AFO? Assess gait w/AFO and trial Bioness to L quads. Eccentric control of L quad, sit to stands w/weight shift to LLE. Establish HEP for LLE strength and endurance    Laverne Klugh E Zavier Canela, PT, DPT 09/17/2022, 10:59 AM

## 2022-09-17 NOTE — Progress Notes (Signed)
MRI of the brain shows no acute findings.  No acute stroke or tumor or brain bleed.  You do have evidence of a prior bleed but that looks old

## 2022-09-25 ENCOUNTER — Ambulatory Visit: Payer: Medicare HMO | Admitting: Physical Therapy

## 2022-09-25 DIAGNOSIS — M6281 Muscle weakness (generalized): Secondary | ICD-10-CM | POA: Diagnosis not present

## 2022-09-25 DIAGNOSIS — R2681 Unsteadiness on feet: Secondary | ICD-10-CM | POA: Diagnosis not present

## 2022-09-25 DIAGNOSIS — R262 Difficulty in walking, not elsewhere classified: Secondary | ICD-10-CM | POA: Diagnosis not present

## 2022-09-25 DIAGNOSIS — R2689 Other abnormalities of gait and mobility: Secondary | ICD-10-CM | POA: Diagnosis not present

## 2022-09-25 DIAGNOSIS — R269 Unspecified abnormalities of gait and mobility: Secondary | ICD-10-CM | POA: Diagnosis not present

## 2022-09-25 DIAGNOSIS — R29898 Other symptoms and signs involving the musculoskeletal system: Secondary | ICD-10-CM | POA: Diagnosis not present

## 2022-09-25 NOTE — Therapy (Signed)
OUTPATIENT PHYSICAL THERAPY NEURO TREATMENT   Patient Name: Russell Russell MRN: [id number] DOB:[birth year], 72 y.o., male Today's Date: 09/25/2022   PCP: Jarold Motto, PA REFERRING PROVIDER: Rodolph Bong, MD  END OF SESSION:  PT End of Session - 09/25/22 1451     Visit Number 2    Number of Visits 13   Plus eval   Date for PT Re-Evaluation 11/12/22   Due to delay in scheduling   Authorization Type [employer]    Progress Note Due on Visit 10    PT Start Time 1447    PT Stop Time 1529    PT Time Calculation (min) 42 min    Equipment Utilized During Treatment --   L AFO, L foot up brace, shoe cover   Activity Tolerance Patient tolerated treatment well    Behavior During Therapy WFL for tasks assessed/performed              Past Medical History:  Diagnosis Date   Alcohol abuse    drinks 1 gallon of Gin every weekend, nothing during the week x >15 yrs   Atrial fibrillation (HCC)    Elevated transaminase level    +elev bili: abd u/s 06/2014 showed stable small hepatic hemangiomas, o/w normal.   Essential hypertension    GERD (gastroesophageal reflux disease)    Hyperlipidemia 03/2014   Atorv started-chol improved   Hypertension    Impaired fasting glucose 03/2014   Nephrolithiasis    PUD (peptic ulcer disease)    Stroke (HCC) 2021   Tobacco dependence    chantix: "psych effects"   Past Surgical History:  Procedure Laterality Date   COLONOSCOPY  2005   Recall 10 yrs ([place])   LOOP RECORDER INSERTION N/A 07/03/2019   Procedure: LOOP RECORDER INSERTION;  Surgeon: Hillis Range, MD;  Location: [facility];  Service: Cardiovascular;  Laterality: N/A;   removal of kidney stones  2006   cystoscopic--removed from ureter.  No prob since.   Patient Active Problem List   Diagnosis Date Noted   Renal structural abnormality 08/24/2021   H/O ETOH abuse 04/28/2020   History of tobacco abuse 01/07/2020   Hemiparesis affecting left side as late effect of  stroke (HCC) 01/07/2020   Abnormality of gait 09/24/2019   AKI (acute kidney injury) (HCC)    Fever    Sepsis without acute organ dysfunction (HCC)    Elevated BUN    Blood pressure increase diastolic    Labile blood pressure    Leukopenia    Benign essential HTN    Hypoalbuminemia    Cerebrovascular accident (CVA) of right basal ganglia (HCC) 07/07/2019   Acute ischemic stroke (HCC)    Dyslipidemia    Dysphagia, post-stroke    Polycythemia    Stroke (HCC) 06/30/2019   Prediabetes 12/23/2016   Elevated LFTs 12/23/2016   Pure hypercholesterolemia 12/21/2016   Alcoholism (HCC) 12/21/2016   Eczema 09/18/2013   Essential hypertension 09/18/2013   Gastroesophageal reflux disease without esophagitis 09/18/2013   Tobacco abuse 09/18/2013    ONSET DATE: 08/31/2022 (referral)  REFERRING DIAG: R29.898 (ICD-10-CM) - Weakness of left lower extremity R26.9 (ICD-10-CM) - Abnormality of gait  THERAPY DIAG:  Unsteadiness on feet  Muscle weakness (generalized)  Other abnormalities of gait and mobility  Difficulty in walking, not elsewhere classified  Rationale for Evaluation and Treatment: Rehabilitation  SUBJECTIVE:  SUBJECTIVE STATEMENT:  Pt reports no acute changes since last visit, no falls. Pt enters clinic with RW, occasional catching of his L foot when walking. Pt's wife bring in his old AFO, received it in 2022. Pt not wearing the AFO due to hassle of donning/doffing it. Pt uses his RW at home and [facility] and wife's shoulder out in the community.  Pt accompanied by:  Wife, [name]  PERTINENT HISTORY: cerebral infarction and cryptogenic stroke.  PAIN:  Are you having pain? No  PRECAUTIONS: Fall  WEIGHT BEARING RESTRICTIONS: No  FALLS: Has patient fallen in last 6 months? No  LIVING  ENVIRONMENT: Lives with: lives with their spouse Lives in: House/apartment Stairs: Yes: External: 3 steps; on right going up, on left going up, and can reach both Has following equipment at home: Quad cane small base, Walker - 2 wheeled, shower chair, and L AFO  PLOF: Needs assistance with homemaking  PATIENT GOALS: "Just strengthen this leg"   OBJECTIVE:   DIAGNOSTIC FINDINGS: MRI of brain from 09/06/22  IMPRESSION: No acute intracranial process. No evidence of acute or subacute infarct.  MRI of brain from 06/2019 IMPRESSION: 1. Multiple scattered acute ischemic infarcts involving the posterior right basal ganglia, right cerebral hemisphere, and right cerebellum as above. No associated hemorrhage or mass effect. 2. Underlying atrophy with advanced chronic microvascular ischemic disease, with multiple remote lacunar infarcts about the bilateral basal ganglia, thalami, and pons, with additional chronic left cerebellar infarct.  COGNITION: Overall cognitive status: Within functional limits for tasks assessed   SENSATION: Pt denies numbness/tingling in his legs    POSTURE: rounded shoulders, forward head, and increased thoracic kyphosis  LOWER EXTREMITY MMT:  Tested in seated position   MMT Right Eval Left Eval  Hip flexion 4+ 4  Hip extension    Hip abduction 4+ 4  Hip adduction 4+ 4  Hip internal rotation    Hip external rotation    Knee flexion 5 4+  Knee extension 5 4-  Ankle dorsiflexion 5 4  Ankle plantarflexion    Ankle inversion    Ankle eversion    (Blank rows = not tested)    VITALS  There were no vitals filed for this visit.  TODAY'S TREATMENT:           Gait/Orthotic Assessment: Gait pattern: decreased hip/knee flexion- Left, decreased ankle dorsiflexion- Left, lateral lean- Left, trunk flexed, and poor foot clearance- Left Distance walked: 115 ft Assistive device utilized: Walker - 2 wheeled Level of assistance: CGA and Min A Comments: with  current L AFO, pt does still drag his foot along the floor but with cues to increase hip flexion and heel strike with gait he is able to improve limb clearance  Gait pattern:  see above Distance walked: 115 ft Assistive device utilized: Environmental consultant - 2 wheeled Level of assistance: CGA Comments: with foot up brace and shoe cover; significantly improved LLE clearance; with removal of shoe cover does tend to catch foot along the floor so does better with shoe cover/toe cap  Gait pattern:  see above Distance walked: 115 ft Assistive device utilized: Walker - 2 wheeled Level of assistance: CGA Comments: with current L AFO and shoe cover/toe cap; does still exhibit decreased LLE clearance  Current recommendation is L foot up brace and toe cap. Provided handout for where to purchase foot up brace but will have pt's wife wait to purchase until after assessment again during Friday's session when pt is more fatigued. Also recommended that she  reach out to [employer] to have a toe cap placed on his blue shoes for improved limb clearance.                                                                                                                         PATIENT EDUCATION: Education details: results of gait assessment with various bracing options this date, handout for where to purchase foot up brace, reach out to [employer] to have toe cap placed on blue tennis shoes Person educated: Patient and Spouse Education method: Explanation, Demonstration, and Handouts Education comprehension: verbalized understanding  HOME EXERCISE PROGRAM: To be established   GOALS: Goals reviewed with patient? Yes  SHORT TERM GOALS: Target date: 10/15/2022    Pt will be independent with initial HEP for improved strength, balance, transfers and gait.  Baseline: not established on eval  Goal status: INITIAL  2.  Pt will improve normal TUG to less than or equal to 30 seconds w/LRAD and SBA for improved functional mobility and  decreased fall risk.  Baseline: 37.32s w/RW and SBA Goal status: INITIAL  3.  Pt will improve 5 x STS to less than or equal to 25 seconds w/equal weightbearing through BLEs to demonstrate improved functional strength and transfer efficiency.   Baseline: 28.62s w/weight through RLE mostly  Goal status: INITIAL  4.  Pt will improve gait velocity to at least 1.4 ft/s w/LRAD for improved gait efficiency   Baseline: 1.0 ft/s w/RW Goal status: INITIAL  5.  Pt will trial various braces in clinic to determine safest and most efficient option for pt to improve independence w/gait.  Baseline: Has AFO- is to bring to first session  Goal status: INITIAL  LONG TERM GOALS: Target date: 10/29/2022    Pt will be independent with final HEP for improved strength, balance, transfers and gait.  Baseline:  Goal status: INITIAL  2.  Pt will improve gait velocity to at least 1.9 ft/s w/LRAD for improved gait efficiency and reduced fall risk  Baseline: 1.0 ft/s w/RW  Goal status: INITIAL  3.  Pt will improve 5 x STS to less than or equal to 20 w/equal BLE weight acceptance seconds to demonstrate improved functional strength and transfer efficiency.   Baseline: 28.62s w/weight through RLE mostly  Goal status: INITIAL  4.  Pt will improve normal TUG to less than or equal to 25 seconds w/LRAD for improved functional mobility and decreased fall risk.  Baseline: 37.32s w/RW and SBA Goal status: INITIAL   ASSESSMENT:  CLINICAL IMPRESSION: Emphasis of skilled PT session on assessing gait with various bracing options. Trialed gait with pt's current AFO, foot up brace, and with/without shoe cover/toe cap. Pt exhibits best gait pattern with use of L foot up brace and shoe cover but would benefit from assessment again with this brace when pt is more fatigued as this brace was trialed towards beginning of therapy session. Additionally, even with use of foot up brace pt does still scoot his LLE  along the  floor at times due to muscle weakness and decreased attention to limb, would benefit from a toe cap on this shoe. Provided handout for where to purchase foot up brace but recommended that pt's wife wait until after re-assessment during Friday session when pt more fatigued to ensure this is still the best option for patient. Pt continues to benefit from skilled therapy services to work towards improving his LLE strength in order to increase his safety and independence with functional mobility. Continue POC.    OBJECTIVE IMPAIRMENTS: Abnormal gait, decreased activity tolerance, decreased balance, decreased endurance, decreased mobility, difficulty walking, and decreased strength  ACTIVITY LIMITATIONS: carrying, lifting, squatting, stairs, transfers, and locomotion level  PARTICIPATION LIMITATIONS: meal prep, cleaning, laundry, medication management, driving, shopping, community activity, and yard work  PERSONAL FACTORS: Age, Fitness, Past/current experiences, and 1 comorbidity: cryptogenic stroke  are also affecting patient's functional outcome.   REHAB POTENTIAL: Fair due to chronicity of CVA and LLE weakness  CLINICAL DECISION MAKING: Stable/uncomplicated  EVALUATION COMPLEXITY: Low  PLAN:  PT FREQUENCY: 1-2x/week  PT DURATION: 6 weeks  PLANNED INTERVENTIONS: Therapeutic exercises, Therapeutic activity, Neuromuscular re-education, Balance training, Gait training, Patient/Family education, Self Care, Joint mobilization, Stair training, Orthotic/Fit training, DME instructions, Aquatic Therapy, Dry Needling, Manual therapy, and Re-evaluation  PLAN FOR NEXT SESSION: trial Bioness to L quads. Eccentric control of L quad, sit to stands w/weight shift to LLE. Establish HEP for LLE strength and endurance, try foot up brace again at end of session when more fatigued to make sure it is still helpful (handout provided 7/16 but wife going to wait to purchase until we try it one more time--pt also to  reach out to [employer] about getting a toe cap on his blue shoes)    Peter Congo, PT, DPT, CSRS  09/25/2022, 3:29 PM

## 2022-09-27 NOTE — Progress Notes (Signed)
[facility]link Summary Report / Loop Recorder 

## 2022-09-28 ENCOUNTER — Ambulatory Visit: Payer: Medicare HMO | Admitting: Physical Therapy

## 2022-09-28 VITALS — BP 149/109 | HR 80

## 2022-09-28 DIAGNOSIS — R2681 Unsteadiness on feet: Secondary | ICD-10-CM

## 2022-09-28 DIAGNOSIS — R2689 Other abnormalities of gait and mobility: Secondary | ICD-10-CM

## 2022-09-28 DIAGNOSIS — M6281 Muscle weakness (generalized): Secondary | ICD-10-CM

## 2022-09-28 NOTE — Therapy (Signed)
OUTPATIENT PHYSICAL THERAPY NEURO TREATMENT- ARRIVED NO CHARGE   Patient Name: Russell Russell MRN: [id number] DOB:06-02-1950, 72 y.o., male Today's Date: 09/28/2022   PCP: Jarold Motto, PA REFERRING PROVIDER: Rodolph Bong, MD  END OF SESSION:  PT End of Session - 09/28/22 1255     Visit Number 2   Arrived no charge   Number of Visits 13   Plus eval   Date for PT Re-Evaluation 11/12/22   Due to delay in scheduling   Authorization Type [employer]    Progress Note Due on Visit 10    PT Start Time 1252    PT Stop Time 1306   Arrived no charge   PT Time Calculation (min) 14 min    Equipment Utilized During Treatment --   L AFO, L foot up brace, shoe cover   Activity Tolerance Treatment limited secondary to medical complications (Comment)   Diastolic htn   Behavior During Therapy [facility] for tasks assessed/performed               Past Medical History:  Diagnosis Date   Alcohol abuse    drinks 1 gallon of Gin every weekend, nothing during the week x >15 yrs   Atrial fibrillation (HCC)    Elevated transaminase level    +elev bili: abd u/s 06/2014 showed stable small hepatic hemangiomas, o/w normal.   Essential hypertension    GERD (gastroesophageal reflux disease)    Hyperlipidemia 03/2014   Atorv started-chol improved   Hypertension    Impaired fasting glucose 03/2014   Nephrolithiasis    PUD (peptic ulcer disease)    Stroke (HCC) 2021   Tobacco dependence    chantix: "psych effects"   Past Surgical History:  Procedure Laterality Date   COLONOSCOPY  2005   Recall 10 yrs ([place])   LOOP RECORDER INSERTION N/A 07/03/2019   Procedure: LOOP RECORDER INSERTION;  Surgeon: Hillis Range, MD;  [facility]: [facility];  Service: Cardiovascular;  Laterality: N/A;   removal of kidney stones  2006   cystoscopic--removed from ureter.  No prob since.   Patient Active Problem List   Diagnosis Date Noted   Renal structural abnormality 08/24/2021   H/O ETOH  abuse 04/28/2020   History of tobacco abuse 01/07/2020   Hemiparesis affecting left side as late effect of stroke (HCC) 01/07/2020   Abnormality of gait 09/24/2019   AKI (acute kidney injury) (HCC)    Fever    Sepsis without acute organ dysfunction (HCC)    Elevated BUN    Blood pressure increase diastolic    Labile blood pressure    Leukopenia    Benign essential HTN    Hypoalbuminemia    Cerebrovascular accident (CVA) of right basal ganglia (HCC) 07/07/2019   Acute ischemic stroke (HCC)    Dyslipidemia    Dysphagia, post-stroke    Polycythemia    Stroke (HCC) 06/30/2019   Prediabetes 12/23/2016   Elevated LFTs 12/23/2016   Pure hypercholesterolemia 12/21/2016   Alcoholism (HCC) 12/21/2016   Eczema 09/18/2013   Essential hypertension 09/18/2013   Gastroesophageal reflux disease without esophagitis 09/18/2013   Tobacco abuse 09/18/2013    ONSET DATE: 08/31/2022 (referral)  REFERRING DIAG: R29.898 (ICD-10-CM) - Weakness of left lower extremity R26.9 (ICD-10-CM) - Abnormality of gait  THERAPY DIAG:  Unsteadiness on feet  Muscle weakness (generalized)  Other abnormalities of gait and mobility  Rationale for Evaluation and Treatment: Rehabilitation  SUBJECTIVE:  SUBJECTIVE STATEMENT: Pt presents with wife. Brought in tennis shoes w/new toe cap applied. Denies acute changes.   Pt accompanied by:  Wife, Russell Russell  PERTINENT HISTORY: cerebral infarction and cryptogenic stroke.  PAIN:  Are you having pain? No  PRECAUTIONS: Fall  WEIGHT BEARING RESTRICTIONS: No  FALLS: Has patient fallen in last 6 months? No  LIVING ENVIRONMENT: Lives with: lives with their spouse Lives in: House/apartment Stairs: Yes: External: 3 steps; on right going up, on left going up, and can reach both Has  following equipment at home: Quad cane small base, Walker - 2 wheeled, shower chair, and L AFO  PLOF: Needs assistance with homemaking  PATIENT GOALS: "Just strengthen this leg"   OBJECTIVE:   DIAGNOSTIC FINDINGS: MRI of brain from 09/06/22  IMPRESSION: No acute intracranial process. No evidence of acute or subacute infarct.  MRI of brain from 06/2019 IMPRESSION: 1. Multiple scattered acute ischemic infarcts involving the posterior right basal ganglia, right cerebral hemisphere, and right cerebellum as above. No associated hemorrhage or mass effect. 2. Underlying atrophy with advanced chronic microvascular ischemic disease, with multiple remote lacunar infarcts about the bilateral basal ganglia, thalami, and pons, with additional chronic left cerebellar infarct.  COGNITION: Overall cognitive status: Within functional limits for tasks assessed   SENSATION: Pt denies numbness/tingling in his legs    POSTURE: rounded shoulders, forward head, and increased thoracic kyphosis  LOWER EXTREMITY MMT:  Tested in seated position   MMT Right Eval Left Eval  Hip flexion 4+ 4  Hip extension    Hip abduction 4+ 4  Hip adduction 4+ 4  Hip internal rotation    Hip external rotation    Knee flexion 5 4+  Knee extension 5 4-  Ankle dorsiflexion 5 4  Ankle plantarflexion    Ankle inversion    Ankle eversion    (Blank rows = not tested)    VITALS  Vitals:   09/28/22 1257 09/28/22 1302  BP: (!) 149/108 (!) 149/109  Pulse: 76 80    TODAY'S TREATMENT:         Ther Act - arrived no charge Assessed vitals (see above) and diastolic BP too elevated to participate in therapy. Educated pt on BE FAST and going to ED if diastolic BP continues to be elevated >110 mmHg or pt starts to have signs/symptoms of stroke. Pt and wife verbalized understanding.               PATIENT EDUCATION: Education details: Monitoring BP at home, going to ED if pt demonstrates signs/symptoms of stroke  (BE FAST) or BP stays >180/110 mmHg  Person educated: Patient and Spouse Education method: Explanation Education comprehension: verbalized understanding  HOME EXERCISE PROGRAM: To be established   GOALS: Goals reviewed with patient? Yes  SHORT TERM GOALS: Target date: 10/15/2022    Pt will be independent with initial HEP for improved strength, balance, transfers and gait.  Baseline: not established on eval  Goal status: INITIAL  2.  Pt will improve normal TUG to less than or equal to 30 seconds w/LRAD and SBA for improved functional mobility and decreased fall risk.  Baseline: 37.32s w/RW and SBA Goal status: INITIAL  3.  Pt will improve 5 x STS to less than or equal to 25 seconds w/equal weightbearing through BLEs to demonstrate improved functional strength and transfer efficiency.   Baseline: 28.62s w/weight through RLE mostly  Goal status: INITIAL  4.  Pt will improve gait velocity to at least 1.4 ft/s w/LRAD for  improved gait efficiency   Baseline: 1.0 ft/s w/RW Goal status: INITIAL  5.  Pt will trial various braces in clinic to determine safest and most efficient option for pt to improve independence w/gait.  Baseline: Has AFO- is to bring to first session  Goal status: INITIAL  LONG TERM GOALS: Target date: 10/29/2022    Pt will be independent with final HEP for improved strength, balance, transfers and gait.  Baseline:  Goal status: INITIAL  2.  Pt will improve gait velocity to at least 1.9 ft/s w/LRAD for improved gait efficiency and reduced fall risk  Baseline: 1.0 ft/s w/RW  Goal status: INITIAL  3.  Pt will improve 5 x STS to less than or equal to 20 w/equal BLE weight acceptance seconds to demonstrate improved functional strength and transfer efficiency.   Baseline: 28.62s w/weight through RLE mostly  Goal status: INITIAL  4.  Pt will improve normal TUG to less than or equal to 25 seconds w/LRAD for improved functional mobility and decreased fall  risk.  Baseline: 37.32s w/RW and SBA Goal status: INITIAL   ASSESSMENT:  CLINICAL IMPRESSION: Arrived no charge due to diastolic HTN.    OBJECTIVE IMPAIRMENTS: Abnormal gait, decreased activity tolerance, decreased balance, decreased endurance, decreased mobility, difficulty walking, and decreased strength  ACTIVITY LIMITATIONS: carrying, lifting, squatting, stairs, transfers, and locomotion level  PARTICIPATION LIMITATIONS: meal prep, cleaning, laundry, medication management, driving, shopping, community activity, and yard work  PERSONAL FACTORS: Age, Fitness, Past/current experiences, and 1 comorbidity: cryptogenic stroke  are also affecting patient's functional outcome.   REHAB POTENTIAL: Fair due to chronicity of CVA and LLE weakness  CLINICAL DECISION MAKING: Stable/uncomplicated  EVALUATION COMPLEXITY: Low  PLAN:  PT FREQUENCY: 1-2x/week  PT DURATION: 6 weeks  PLANNED INTERVENTIONS: Therapeutic exercises, Therapeutic activity, Neuromuscular re-education, Balance training, Gait training, Patient/Family education, Self Care, Joint mobilization, Stair training, Orthotic/Fit training, DME instructions, Aquatic Therapy, Dry Needling, Manual therapy, and Re-evaluation  PLAN FOR NEXT SESSION: Monitor BP. trial Bioness to L quads. Eccentric control of L quad, sit to stands w/weight shift to LLE. Establish HEP for LLE strength and endurance, try foot up brace again at end of session when more fatigued to make sure it is still helpful (handout provided 7/16 but wife going to wait to purchase until we try it one more time--pt also to reach out to [employer] about getting a toe cap on his blue shoes)    Jill Alexanders Lattie Riege, PT, DPT [facility] [address] [place], [place]  [address] Phone:  [phone] Fax:  [phone]  09/28/2022, 1:07 PM

## 2022-10-01 ENCOUNTER — Ambulatory Visit: Payer: Medicare HMO | Admitting: Physical Therapy

## 2022-10-01 ENCOUNTER — Telehealth: Payer: Self-pay | Admitting: Physician Assistant

## 2022-10-01 VITALS — BP 149/113 | HR 71

## 2022-10-01 DIAGNOSIS — R2689 Other abnormalities of gait and mobility: Secondary | ICD-10-CM

## 2022-10-01 DIAGNOSIS — R2681 Unsteadiness on feet: Secondary | ICD-10-CM

## 2022-10-01 DIAGNOSIS — M6281 Muscle weakness (generalized): Secondary | ICD-10-CM

## 2022-10-01 NOTE — Telephone Encounter (Signed)
Final Outcome: See PCP within 24 hours. Patient is scheduled for 10/02/22 @ 2:40 pm. Patient has confirmed visit.    Patient Name First: Russell Last: Russell Gender: Male DOB: [birth year] Age: 72 Y 9 M 10 D Return Phone Number: [phone] (Primary) Address: City/ State/ Zip: [place]  [address] Client [facility] - [facility], [facility] Provider Bufford Buttner, Prado Verde- PA Contact Type Call Who Is Calling Patient / Member / Family / Caregiver Call Type Triage / Clinical Caller Name [name] Relationship To Patient Spouse Return Phone Number [phone] (Primary) Chief Complaint Blood Pressure High Reason for Call Symptomatic / Request for Health Information Initial Comment Caller states her husbands blood pressure is high 159/115 Translation No Nurse Assessment Nurse: [place], RN, Marchelle Folks Date/Time (Eastern Time): 10/01/2022 3:10:14 PM Confirm and document reason for call. If symptomatic, describe symptoms. ---caller states her husband is at therapy right now but cant be seen because his blood pressure is high. Does the patient have any new or worsening symptoms? ---Yes Will a triage be completed? ---Yes Related visit to physician within the last 2 weeks? ---N/A Does the PT have any chronic conditions? (i.e. diabetes, asthma, this includes High risk factors for pregnancy, etc.) ---Yes List chronic conditions. ---HTN on metoprolol Is this a behavioral health or substance abuse call? ---No Guidelines Guideline Title Affirmed Question Affirmed Notes Nurse Date/Time (Eastern Time) Blood Pressure - High Systolic BP >= 180 OR Diastolic >= 110 [place]et, RN, Amanda 10/01/2022 3:11:21 PM Disp. Time Lamount Cohen Time) Disposition Final User 10/01/2022 3:12:57 PM See PCP within 24 Hours Yes [place], RN, Marchelle Folks Final Disposition 10/01/2022 3:12:57 PM See PCP within 24 Hours Yes [place], RN, Earnestine Leys  Disagree/Comply Comply Caller Understands Yes PreDisposition Did not know what to do Care Advice Given Per Guideline SEE PCP WITHIN 24 HOURS: * IF OFFICE WILL BE OPEN: You need to be examined within the next 24 hours. Call your doctor (or NP/PA) when the office opens and make an appointment. CARE ADVICE given per High Blood Pressure (Adult) guideline. * You become worse * Chest pain or difficulty breathing occurs CALL BACK IF: * Weakness or numbness of the face, arm or leg on one side of the body occurs * Difficulty walking, difficulty talking, or severe headache occurs

## 2022-10-01 NOTE — Telephone Encounter (Signed)
FYI, scheduled tomorrow.

## 2022-10-01 NOTE — Telephone Encounter (Signed)
I called pt after they scheduled an OV with pcp stating patient's BP has reached 158/118.   FYI: This call has been transferred to triage nurse: Access Nurse. Once the result note has been entered staff can address the message at that time.  Patient called in with the following symptoms:  Red Word:elevated blood pressure   Please advise at Mobile [phone] (mobile)  Message is routed to Provider Pool.

## 2022-10-01 NOTE — Therapy (Signed)
OUTPATIENT PHYSICAL THERAPY NEURO TREATMENT- ARRIVED NO CHARGE   Patient Name: Russell Russell MRN: [id number] DOB:11/22/1950, 72 y.o., male Today's Date: 10/01/2022   PCP: Jarold Motto, PA REFERRING PROVIDER: Rodolph Bong, MD  END OF SESSION:  PT End of Session - 10/01/22 1457     Visit Number 2   Arrived no charge   Number of Visits 13   Plus eval   Date for PT Re-Evaluation 11/12/22   Due to delay in scheduling   Authorization Type [employer]    Progress Note Due on Visit 10    PT Start Time 1448    PT Stop Time 1516   Arrived no charge   PT Time Calculation (min) 28 min    Equipment Utilized During Treatment --   L AFO, L foot up brace, shoe cover   Activity Tolerance Treatment limited secondary to medical complications (Comment)   Diastolic htn   Behavior During Therapy [facility] for tasks assessed/performed                Past Medical History:  Diagnosis Date   Alcohol abuse    drinks 1 gallon of Gin every weekend, nothing during the week x >15 yrs   Atrial fibrillation (HCC)    Elevated transaminase level    +elev bili: abd u/s 06/2014 showed stable small hepatic hemangiomas, o/w normal.   Essential hypertension    GERD (gastroesophageal reflux disease)    Hyperlipidemia 03/2014   Atorv started-chol improved   Hypertension    Impaired fasting glucose 03/2014   Nephrolithiasis    PUD (peptic ulcer disease)    Stroke (HCC) 2021   Tobacco dependence    chantix: "psych effects"   Past Surgical History:  Procedure Laterality Date   COLONOSCOPY  2005   Recall 10 yrs ([place])   LOOP RECORDER INSERTION N/A 07/03/2019   Procedure: LOOP RECORDER INSERTION;  Surgeon: Hillis Range, MD;  Location: [facility];  Service: Cardiovascular;  Laterality: N/A;   removal of kidney stones  2006   cystoscopic--removed from ureter.  No prob since.   Patient Active Problem List   Diagnosis Date Noted   Renal structural abnormality 08/24/2021   H/O ETOH  abuse 04/28/2020   History of tobacco abuse 01/07/2020   Hemiparesis affecting left side as late effect of stroke (HCC) 01/07/2020   Abnormality of gait 09/24/2019   AKI (acute kidney injury) (HCC)    Fever    Sepsis without acute organ dysfunction (HCC)    Elevated BUN    Blood pressure increase diastolic    Labile blood pressure    Leukopenia    Benign essential HTN    Hypoalbuminemia    Cerebrovascular accident (CVA) of right basal ganglia (HCC) 07/07/2019   Acute ischemic stroke (HCC)    Dyslipidemia    Dysphagia, post-stroke    Polycythemia    Stroke (HCC) 06/30/2019   Prediabetes 12/23/2016   Elevated LFTs 12/23/2016   Pure hypercholesterolemia 12/21/2016   Alcoholism (HCC) 12/21/2016   Eczema 09/18/2013   Essential hypertension 09/18/2013   Gastroesophageal reflux disease without esophagitis 09/18/2013   Tobacco abuse 09/18/2013    ONSET DATE: 08/31/2022 (referral)  REFERRING DIAG: R29.898 (ICD-10-CM) - Weakness of left lower extremity R26.9 (ICD-10-CM) - Abnormality of gait  THERAPY DIAG:  Unsteadiness on feet  Muscle weakness (generalized)  Other abnormalities of gait and mobility  Rationale for Evaluation and Treatment: Rehabilitation  SUBJECTIVE:  SUBJECTIVE STATEMENT: Pt presents with wife. Brought in tennis shoes w/new toe cap applied. Wife brought in BP log from home and pt's diastolic BP continues to be >100 mmHg    Pt accompanied by:  Wife, Russell Russell  PERTINENT HISTORY: cerebral infarction and cryptogenic stroke.  PAIN:  Are you having pain? No  PRECAUTIONS: Fall  WEIGHT BEARING RESTRICTIONS: No  FALLS: Has patient fallen in last 6 months? No  LIVING ENVIRONMENT: Lives with: lives with their spouse Lives in: House/apartment Stairs: Yes: External: 3 steps; on  right going up, on left going up, and can reach both Has following equipment at home: Quad cane small base, Walker - 2 wheeled, shower chair, and L AFO  PLOF: Needs assistance with homemaking  PATIENT GOALS: "Just strengthen this leg"   OBJECTIVE:   DIAGNOSTIC FINDINGS: MRI of brain from 09/06/22  IMPRESSION: No acute intracranial process. No evidence of acute or subacute infarct.  MRI of brain from 06/2019 IMPRESSION: 1. Multiple scattered acute ischemic infarcts involving the posterior right basal ganglia, right cerebral hemisphere, and right cerebellum as above. No associated hemorrhage or mass effect. 2. Underlying atrophy with advanced chronic microvascular ischemic disease, with multiple remote lacunar infarcts about the bilateral basal ganglia, thalami, and pons, with additional chronic left cerebellar infarct.  COGNITION: Overall cognitive status: Within functional limits for tasks assessed   SENSATION: Pt denies numbness/tingling in his legs    POSTURE: rounded shoulders, forward head, and increased thoracic kyphosis  LOWER EXTREMITY MMT:  Tested in seated position   MMT Right Eval Left Eval  Hip flexion 4+ 4  Hip extension    Hip abduction 4+ 4  Hip adduction 4+ 4  Hip internal rotation    Hip external rotation    Knee flexion 5 4+  Knee extension 5 4-  Ankle dorsiflexion 5 4  Ankle plantarflexion    Ankle inversion    Ankle eversion    (Blank rows = not tested)    VITALS  Vitals:   10/01/22 1502 10/01/22 1514  BP: (!) 154/115 (!) 149/113  Pulse: 69 71     TODAY'S TREATMENT:         Ther Act - arrived no charge Assessed vitals (see above) and diastolic BP too elevated to participate in therapy. Pt denied signs/symptoms of CVA. Pt's wife called PCP's office while in clinic to determine if pt should go to ED today and made an appointment w/PCP tomorrow. Per PCP, pt is to closely monitor BP today and if BP reaches >/= 200/120 mmHg, pt is to go to  ED.               PATIENT EDUCATION: Education details: Monitoring BP at home, going to ED if pt demonstrates signs/symptoms of stroke (BE FAST) or BP stays >200/120 mmHg  Person educated: Patient and Spouse Education method: Explanation Education comprehension: verbalized understanding  HOME EXERCISE PROGRAM: To be established   GOALS: Goals reviewed with patient? Yes  SHORT TERM GOALS: Target date: 10/15/2022    Pt will be independent with initial HEP for improved strength, balance, transfers and gait.  Baseline: not established on eval  Goal status: INITIAL  2.  Pt will improve normal TUG to less than or equal to 30 seconds w/LRAD and SBA for improved functional mobility and decreased fall risk.  Baseline: 37.32s w/RW and SBA Goal status: INITIAL  3.  Pt will improve 5 x STS to less than or equal to 25 seconds w/equal weightbearing through BLEs to  demonstrate improved functional strength and transfer efficiency.   Baseline: 28.62s w/weight through RLE mostly  Goal status: INITIAL  4.  Pt will improve gait velocity to at least 1.4 ft/s w/LRAD for improved gait efficiency   Baseline: 1.0 ft/s w/RW Goal status: INITIAL  5.  Pt will trial various braces in clinic to determine safest and most efficient option for pt to improve independence w/gait.  Baseline: Has AFO- is to bring to first session  Goal status: INITIAL  LONG TERM GOALS: Target date: 10/29/2022    Pt will be independent with final HEP for improved strength, balance, transfers and gait.  Baseline:  Goal status: INITIAL  2.  Pt will improve gait velocity to at least 1.9 ft/s w/LRAD for improved gait efficiency and reduced fall risk  Baseline: 1.0 ft/s w/RW  Goal status: INITIAL  3.  Pt will improve 5 x STS to less than or equal to 20 w/equal BLE weight acceptance seconds to demonstrate improved functional strength and transfer efficiency.   Baseline: 28.62s w/weight through RLE mostly  Goal status:  INITIAL  4.  Pt will improve normal TUG to less than or equal to 25 seconds w/LRAD for improved functional mobility and decreased fall risk.  Baseline: 37.32s w/RW and SBA Goal status: INITIAL   ASSESSMENT:  CLINICAL IMPRESSION: Arrived no charge due to diastolic HTN.    OBJECTIVE IMPAIRMENTS: Abnormal gait, decreased activity tolerance, decreased balance, decreased endurance, decreased mobility, difficulty walking, and decreased strength  ACTIVITY LIMITATIONS: carrying, lifting, squatting, stairs, transfers, and locomotion level  PARTICIPATION LIMITATIONS: meal prep, cleaning, laundry, medication management, driving, shopping, community activity, and yard work  PERSONAL FACTORS: Age, Fitness, Past/current experiences, and 1 comorbidity: cryptogenic stroke  are also affecting patient's functional outcome.   REHAB POTENTIAL: Fair due to chronicity of CVA and LLE weakness  CLINICAL DECISION MAKING: Stable/uncomplicated  EVALUATION COMPLEXITY: Low  PLAN:  PT FREQUENCY: 1-2x/week  PT DURATION: 6 weeks  PLANNED INTERVENTIONS: Therapeutic exercises, Therapeutic activity, Neuromuscular re-education, Balance training, Gait training, Patient/Family education, Self Care, Joint mobilization, Stair training, Orthotic/Fit training, DME instructions, Aquatic Therapy, Dry Needling, Manual therapy, and Re-evaluation  PLAN FOR NEXT SESSION: Monitor BP. trial Bioness to L quads. Eccentric control of L quad, sit to stands w/weight shift to LLE. Establish HEP for LLE strength and endurance, try foot up brace again at end of session when more fatigued to make sure it is still helpful (handout provided 7/16 but wife going to wait to purchase until we try it one more time--pt also to reach out to [employer] about getting a toe cap on his blue shoes)    Jill Alexanders Tehran Rabenold, PT, DPT [facility] [address] [place], [place]  [address] Phone:  [phone] Fax:   [phone]  10/01/2022, 3:22 PM

## 2022-10-02 ENCOUNTER — Encounter: Payer: Self-pay | Admitting: Physician Assistant

## 2022-10-02 ENCOUNTER — Ambulatory Visit (INDEPENDENT_AMBULATORY_CARE_PROVIDER_SITE_OTHER): Payer: Medicare HMO | Admitting: Physician Assistant

## 2022-10-02 VITALS — BP 140/86 | HR 67 | Temp 97.5°F | Ht 70.0 in | Wt 183.0 lb

## 2022-10-02 DIAGNOSIS — I10 Essential (primary) hypertension: Secondary | ICD-10-CM | POA: Diagnosis not present

## 2022-10-02 DIAGNOSIS — I6340 Cerebral infarction due to embolism of unspecified cerebral artery: Secondary | ICD-10-CM

## 2022-10-02 DIAGNOSIS — Z720 Tobacco use: Secondary | ICD-10-CM

## 2022-10-02 MED ORDER — LOSARTAN POTASSIUM 25 MG PO TABS: 25.0000 mg | ORAL_TABLET | Freq: Every day | ORAL | 1 refills | Status: DC

## 2022-10-02 NOTE — Progress Notes (Signed)
Russell Russell is a 72 y.o. male here for a follow up of a pre-existing problem.  History of Present Illness:   Chief Complaint  Patient presents with   Hypertension    Pt's blood pressure has been elevated the past month.    HPI  HTN: He complains of consistently high blood pressure. His average blood pressure reading are 140/100. He denies any changes in intake of alcohol or smoking -- however wife states that he is possibly smoking more. He reports compliance of metoprolol succinate XL 25 mg once daily at night.   He was previously on lisinopril but this was stopped because his blood pressure got too low, however wife would like for him to trial something else for his blood pressure. He's unable to have his physical therapy due to his high blood pressure readings.  He denies any insomnia, leg swelling, vision changes, or SOB.  BP Readings from Last 3 Encounters:  10/02/22 (!) 150/90  10/01/22 (!) 149/113  09/28/22 (!) 149/109   Smoking: He continues to smoke and is not willing to stop.  His pack lasts him for about 3 days on average.  His nicotine patch has irritated him previously.    Past Medical History:  Diagnosis Date   Alcohol abuse    drinks 1 gallon of Gin every weekend, nothing during the week x >15 yrs   Atrial fibrillation (HCC)    Elevated transaminase level    +elev bili: abd u/s 06/2014 showed stable small hepatic hemangiomas, o/w normal.   Essential hypertension    GERD (gastroesophageal reflux disease)    Hyperlipidemia 03/2014   Atorv started-chol improved   Hypertension    Impaired fasting glucose 03/2014   Nephrolithiasis    PUD (peptic ulcer disease)    Stroke (HCC) 2021   Tobacco dependence    chantix: "psych effects"     Social History   Tobacco Use   Smoking status: Every Day    Current packs/day: 0.25    Average packs/day: 0.3 packs/day for 50.6 years (12.6 ttl pk-yrs)    Types: Cigarettes    Start date: 1974   Smokeless tobacco:  Never  Vaping Use   Vaping status: Never Used  Substance Use Topics   Alcohol use: Not Currently    Alcohol/week: 8.0 standard drinks of alcohol    Types: 8 Cans of beer per week    Comment: Weekends only   Drug use: No    Past Surgical History:  Procedure Laterality Date   COLONOSCOPY  2005   Recall 10 yrs ([place])   LOOP RECORDER INSERTION N/A 07/03/2019   Procedure: LOOP RECORDER INSERTION;  Surgeon: Hillis Range, MD;  Location: [facility];  Service: Cardiovascular;  Laterality: N/A;   removal of kidney stones  2006   cystoscopic--removed from ureter.  No prob since.    Family History  Problem Relation Age of Onset   Colon cancer Mother    Cancer Father    Breast cancer Sister    Breast cancer Sister    Stomach cancer Neg Hx    Esophageal cancer Neg Hx    Colon polyps Neg Hx     Allergies  Allergen Reactions   Penicillins Rash    Current Medications:   Current Outpatient Medications:    acetaminophen (TYLENOL) 325 MG tablet, Take 2 tablets (650 mg total) by mouth every 4 (four) hours as needed for mild pain (or temp > 37.5 C (99.5 F))., Disp:  , Rfl:  atorvastatin (LIPITOR) 40 MG tablet, TAKE 1 TABLET BY MOUTH EVERY DAY, Disp: 90 tablet, Rfl: 1   folic acid (FOLVITE) 1 MG tablet, TAKE 1 TABLET BY MOUTH EVERY DAY, Disp: 90 tablet, Rfl: 1   losartan (COZAAR) 25 MG tablet, Take 1 tablet (25 mg total) by mouth daily., Disp: 90 tablet, Rfl: 1   metoprolol succinate (TOPROL XL) 25 MG 24 hr tablet, Take 1 tablet (25 mg total) by mouth at bedtime., Disp: 90 tablet, Rfl: 3   Multiple Vitamin (MULTIVITAMIN WITH MINERALS) TABS tablet, Take 1 tablet by mouth daily., Disp:  , Rfl:    omeprazole (PRILOSEC) 20 MG capsule, Take 20 mg by mouth as needed (acid reflux)., Disp: , Rfl:    rivaroxaban (XARELTO) 20 MG TABS tablet, TAKE 1 TABLET EVERY DAY WITH SUPPER., Disp: 90 tablet, Rfl: 3   Review of Systems:   Review of Systems  Eyes:  Negative for blurred vision.   Respiratory:  Negative for shortness of breath.   Cardiovascular:  Negative for leg swelling.  Psychiatric/Behavioral:  The patient does not have insomnia.     Vitals:   Vitals:   10/02/22 1441  BP: (!) 150/90  Pulse: 67  Temp: (!) 97.5 F (36.4 C)  TempSrc: Temporal  SpO2: 97%  Weight: 183 lb (83 kg)  Height: 5\' 10"  (1.778 m)     Body mass index is 26.26 kg/m.  Physical Exam:   Physical Exam Vitals and nursing note reviewed.  Constitutional:      General: He is not in acute distress.    Appearance: He is well-developed. He is not ill-appearing or toxic-appearing.  Cardiovascular:     Rate and Rhythm: Normal rate and regular rhythm.     Pulses: Normal pulses.     Heart sounds: Normal heart sounds, S1 normal and S2 normal.  Pulmonary:     Effort: Pulmonary effort is normal.     Breath sounds: Normal breath sounds.  Skin:    General: Skin is warm and dry.  Neurological:     Mental Status: He is alert.     GCS: GCS eye subscore is 4. GCS verbal subscore is 5. GCS motor subscore is 6.  Psychiatric:        Speech: Speech normal.        Behavior: Behavior normal. Behavior is cooperative.     Assessment and Plan:   Essential hypertension Above goal today No evidence of end-organ damage on my exam Recommend patient monitor home blood pressure at least a few times weekly Continue toprol xl 25 mg daily add losartan 25 mg daily Recommend MyChart message in 2-4 weeks with average readings If home monitoring shows consistent elevation, or any symptom(s) develop, recommend reach out to [place] for further advice on next steps May need to return to cardiology for further evaluation if lack of improvement  Tobacco abuse Not willing to reduce intake Continue efforts at counseling  Cerebrovascular accident (CVA) due to embolism of cerebral artery (HCC) Discussed need for better blood pressure control, smoking reduction to prevent stroke recurrence  I,Shehryar Baig,acting  as a scribe for [employer], PA.,have documented all relevant documentation on the behalf of Jarold Motto, PA,as directed by  Jarold Motto, PA while in the presence of Jarold Motto, [place].  I, Jarold Motto, [place], have reviewed all documentation for this visit. The documentation on 10/02/22 for the exam, diagnosis, procedures, and orders are all accurate and complete.   Jarold Motto, PA-C

## 2022-10-02 NOTE — Patient Instructions (Addendum)
It was great to see you!  Start losartan 25 mg daily Send me a MyChart message in 2-4 weeks with blood pressure averages, sooner if concerns  Goal blood pressure is <130/80  Take care,  Jarold Motto PA-C

## 2022-10-03 ENCOUNTER — Ambulatory Visit: Payer: Medicare HMO | Admitting: Physical Therapy

## 2022-10-03 VITALS — BP 152/98 | HR 69

## 2022-10-03 DIAGNOSIS — R2689 Other abnormalities of gait and mobility: Secondary | ICD-10-CM

## 2022-10-03 DIAGNOSIS — M6281 Muscle weakness (generalized): Secondary | ICD-10-CM

## 2022-10-03 LAB — COMPREHENSIVE METABOLIC PANEL
ALT: 17 U/L (ref 0–53)
AST: 23 U/L (ref 0–37)
Albumin: 3.9 g/dL (ref 3.5–5.2)
Alkaline Phosphatase: 95 U/L (ref 39–117)
BUN: 11 mg/dL (ref 6–23)
CO2: 26 mEq/L (ref 19–32)
Calcium: 9.6 mg/dL (ref 8.4–10.5)
Chloride: 107 mEq/L (ref 96–112)
Creatinine, Ser: 1.4 mg/dL (ref 0.40–1.50)
GFR: 50.83 mL/min — ABNORMAL LOW (ref >60.00)
Glucose, Bld: 100 mg/dL — ABNORMAL HIGH (ref 70–99)
Potassium: 4.4 mEq/L (ref 3.5–5.1)
Sodium: 140 mEq/L (ref 135–145)
Total Bilirubin: 0.9 mg/dL (ref 0.2–1.2)
Total Protein: 7.6 g/dL (ref 6.0–8.3)

## 2022-10-03 LAB — TSH: TSH: 1.15 u[IU]/mL (ref 0.35–5.50)

## 2022-10-03 LAB — CBC
HCT: 43.7 % (ref 39.0–52.0)
Hemoglobin: 14.3 g/dL (ref 13.0–17.0)
MCHC: 32.7 g/dL (ref 30.0–36.0)
MCV: 92.9 fl (ref 78.0–100.0)
Platelets: 241 10*3/uL (ref 150.0–400.0)
RBC: 4.7 Mil/uL (ref 4.22–5.81)
RDW: 15.8 % — ABNORMAL HIGH (ref 11.5–15.5)
WBC: 5.6 10*3/uL (ref 4.0–10.5)

## 2022-10-03 NOTE — Therapy (Signed)
OUTPATIENT PHYSICAL THERAPY NEURO TREATMENT- ARRIVED NO CHARGE   Patient Name: Russell Russell MRN: [id number] DOB:[birth year], 72 y.o., male Today's Date: 10/03/2022   PCP: Jarold Motto, PA REFERRING PROVIDER: Rodolph Bong, MD  END OF SESSION:  PT End of Session - 10/03/22 1255     Visit Number 2   Arrived no charge   Number of Visits 13   Plus eval   Date for PT Re-Evaluation 11/12/22   Due to delay in scheduling   Authorization Type [employer]    Progress Note Due on Visit 10    PT Start Time 1231    PT Stop Time 1254   Arrived no charge   PT Time Calculation (min) 23 min    Equipment Utilized During Treatment --   L AFO, L foot up brace, shoe cover   Activity Tolerance Treatment limited secondary to medical complications (Comment)   Diastolic htn   Behavior During Therapy [facility] for tasks assessed/performed                 Past Medical History:  Diagnosis Date   Alcohol abuse    drinks 1 gallon of Gin every weekend, nothing during the week x >15 yrs   Atrial fibrillation (HCC)    Elevated transaminase level    +elev bili: abd u/s 06/2014 showed stable small hepatic hemangiomas, o/w normal.   Essential hypertension    GERD (gastroesophageal reflux disease)    Hyperlipidemia 03/2014   Atorv started-chol improved   Hypertension    Impaired fasting glucose 03/2014   Nephrolithiasis    PUD (peptic ulcer disease)    Stroke (HCC) 2021   Tobacco dependence    chantix: "psych effects"   Past Surgical History:  Procedure Laterality Date   COLONOSCOPY  2005   Recall 10 yrs ([place])   LOOP RECORDER INSERTION N/A 07/03/2019   Procedure: LOOP RECORDER INSERTION;  Surgeon: Hillis Range, MD;  Location: [facility];  Service: Cardiovascular;  Laterality: N/A;   removal of kidney stones  2006   cystoscopic--removed from ureter.  No prob since.   Patient Active Problem List   Diagnosis Date Noted   Renal structural abnormality 08/24/2021   H/O ETOH  abuse 04/28/2020   History of tobacco abuse 01/07/2020   Hemiparesis affecting left side as late effect of stroke (HCC) 01/07/2020   Abnormality of gait 09/24/2019   AKI (acute kidney injury) (HCC)    Fever    Sepsis without acute organ dysfunction (HCC)    Elevated BUN    Blood pressure increase diastolic    Labile blood pressure    Leukopenia    Benign essential HTN    Hypoalbuminemia    Cerebrovascular accident (CVA) of right basal ganglia (HCC) 07/07/2019   Acute ischemic stroke (HCC)    Dyslipidemia    Dysphagia, post-stroke    Polycythemia    Stroke (HCC) 06/30/2019   Prediabetes 12/23/2016   Elevated LFTs 12/23/2016   Pure hypercholesterolemia 12/21/2016   Alcoholism (HCC) 12/21/2016   Eczema 09/18/2013   Essential hypertension 09/18/2013   Gastroesophageal reflux disease without esophagitis 09/18/2013   Tobacco abuse 09/18/2013    ONSET DATE: 08/31/2022 (referral)  REFERRING DIAG: R29.898 (ICD-10-CM) - Weakness of left lower extremity R26.9 (ICD-10-CM) - Abnormality of gait  THERAPY DIAG:  Muscle weakness (generalized)  Other abnormalities of gait and mobility  Rationale for Evaluation and Treatment: Rehabilitation  SUBJECTIVE:  SUBJECTIVE STATEMENT: Pt presents with wife. Brought in tennis shoes w/new toe cap applied. Wife brought in BP log from home and pt's diastolic BP continues to be >100 mmHg    Pt accompanied by:  Wife, [name]  PERTINENT HISTORY: cerebral infarction and cryptogenic stroke.  PAIN:  Are you having pain? No  PRECAUTIONS: Fall  WEIGHT BEARING RESTRICTIONS: No  FALLS: Has patient fallen in last 6 months? No  LIVING ENVIRONMENT: Lives with: lives with their spouse Lives in: House/apartment Stairs: Yes: External: 3 steps; on right going up, on left  going up, and can reach both Has following equipment at home: Quad cane small base, Walker - 2 wheeled, shower chair, and L AFO  PLOF: Needs assistance with homemaking  PATIENT GOALS: "Just strengthen this leg"   OBJECTIVE:   DIAGNOSTIC FINDINGS: MRI of brain from 09/06/22  IMPRESSION: No acute intracranial process. No evidence of acute or subacute infarct.  MRI of brain from 06/2019 IMPRESSION: 1. Multiple scattered acute ischemic infarcts involving the posterior right basal ganglia, right cerebral hemisphere, and right cerebellum as above. No associated hemorrhage or mass effect. 2. Underlying atrophy with advanced chronic microvascular ischemic disease, with multiple remote lacunar infarcts about the bilateral basal ganglia, thalami, and pons, with additional chronic left cerebellar infarct.  COGNITION: Overall cognitive status: Within functional limits for tasks assessed   SENSATION: Pt denies numbness/tingling in his legs    POSTURE: rounded shoulders, forward head, and increased thoracic kyphosis  LOWER EXTREMITY MMT:  Tested in seated position   MMT Right Eval Left Eval  Hip flexion 4+ 4  Hip extension    Hip abduction 4+ 4  Hip adduction 4+ 4  Hip internal rotation    Hip external rotation    Knee flexion 5 4+  Knee extension 5 4-  Ankle dorsiflexion 5 4  Ankle plantarflexion    Ankle inversion    Ankle eversion    (Blank rows = not tested)    VITALS  Vitals:   10/03/22 1241 10/03/22 1244  BP: (!) 148/106 (!) 152/98  Pulse: 69       TODAY'S TREATMENT:         [facility] - arrived no charge Assessed vitals (see above) and diastolic BP too elevated to participate in therapy. Assessed via machine and manually. Pt reports he started new medication this morning to lower BP and that BP was 140/90 mmHg at home. Encouraged pt to bring his cuff to next session to compare to clinic's cuff. Pt and wife verbalized understanding  Established HEP (see bolded  below) for improved weight acceptance on LLE and transfers. Pt rotating to R side and bracing R leg against mat w/transfer but is able to perform w/equal weight shift and no UE support. Encouraged pt to practice proper technique at home for improved LLE strength, pt verbalized understanding.               PATIENT EDUCATION: Education details: Monitoring BP at home, bringing BP cuff to next session, initial HEP  Person educated: Patient and Spouse Education method: Explanation Education comprehension: verbalized understanding  HOME EXERCISE PROGRAM: Access Code: [email or web address] URL: [email or web address] Date: 10/03/2022 Prepared by: Alethia Berthold Isa Hitz  Exercises - Sit to Stand Without Arm Support  - 1 x daily - 7 x weekly - 3 sets - 10 reps  GOALS: Goals reviewed with patient? Yes  SHORT TERM GOALS: Target date: 10/15/2022    Pt will be independent with initial HEP for improved strength, balance, transfers  and gait.  Baseline: not established on eval  Goal status: INITIAL  2.  Pt will improve normal TUG to less than or equal to 30 seconds w/LRAD and SBA for improved functional mobility and decreased fall risk.  Baseline: 37.32s w/RW and SBA Goal status: INITIAL  3.  Pt will improve 5 x STS to less than or equal to 25 seconds w/equal weightbearing through BLEs to demonstrate improved functional strength and transfer efficiency.   Baseline: 28.62s w/weight through RLE mostly  Goal status: INITIAL  4.  Pt will improve gait velocity to at least 1.4 ft/s w/LRAD for improved gait efficiency   Baseline: 1.0 ft/s w/RW Goal status: INITIAL  5.  Pt will trial various braces in clinic to determine safest and most efficient option for pt to improve independence w/gait.  Baseline: Has AFO- is to bring to first session  Goal status: INITIAL  LONG TERM GOALS: Target date: 10/29/2022    Pt will be independent with final HEP for improved strength, balance, transfers and  gait.  Baseline:  Goal status: INITIAL  2.  Pt will improve gait velocity to at least 1.9 ft/s w/LRAD for improved gait efficiency and reduced fall risk  Baseline: 1.0 ft/s w/RW  Goal status: INITIAL  3.  Pt will improve 5 x STS to less than or equal to 20 w/equal BLE weight acceptance seconds to demonstrate improved functional strength and transfer efficiency.   Baseline: 28.62s w/weight through RLE mostly  Goal status: INITIAL  4.  Pt will improve normal TUG to less than or equal to 25 seconds w/LRAD for improved functional mobility and decreased fall risk.  Baseline: 37.32s w/RW and SBA Goal status: INITIAL   ASSESSMENT:  CLINICAL IMPRESSION: Arrived no charge due to diastolic HTN. Did establish basic HEP as pt has missed 3 PT appointments due to HTN and has not been given HEP.  Added 3 PT appointments to end of POC to make up missed time.    OBJECTIVE IMPAIRMENTS: Abnormal gait, decreased activity tolerance, decreased balance, decreased endurance, decreased mobility, difficulty walking, and decreased strength  ACTIVITY LIMITATIONS: carrying, lifting, squatting, stairs, transfers, and locomotion level  PARTICIPATION LIMITATIONS: meal prep, cleaning, laundry, medication management, driving, shopping, community activity, and yard work  PERSONAL FACTORS: Age, Fitness, Past/current experiences, and 1 comorbidity: cryptogenic stroke  are also affecting patient's functional outcome.   REHAB POTENTIAL: Fair due to chronicity of CVA and LLE weakness  CLINICAL DECISION MAKING: Stable/uncomplicated  EVALUATION COMPLEXITY: Low  PLAN:  PT FREQUENCY: 1-2x/week  PT DURATION: 6 weeks  PLANNED INTERVENTIONS: Therapeutic exercises, Therapeutic activity, Neuromuscular re-education, Balance training, Gait training, Patient/Family education, Self Care, Joint mobilization, Stair training, Orthotic/Fit training, DME instructions, Aquatic Therapy, Dry Needling, Manual therapy, and  Re-evaluation  PLAN FOR NEXT SESSION: Monitor BP. trial Bioness to L quads. Eccentric control of L quad, sit to stands w/weight shift to LLE. Establish HEP for LLE strength and endurance, try foot up brace again at end of session when more fatigued to make sure it is still helpful (handout provided 7/16 but wife going to wait to purchase until we try it one more time--pt also to reach out to [employer] about getting a toe cap on his blue shoes)    Jill Alexanders Sherrill [place], PT, DPT [facility] [address] [place], [place]  [address] Phone:  [phone] Fax:  [phone]  10/03/2022, 12:56 PM

## 2022-10-04 ENCOUNTER — Other Ambulatory Visit: Payer: Self-pay | Admitting: Physician Assistant

## 2022-10-09 ENCOUNTER — Ambulatory Visit: Payer: Medicare HMO | Admitting: Physical Therapy

## 2022-10-09 VITALS — BP 135/95 | HR 76

## 2022-10-09 DIAGNOSIS — R2681 Unsteadiness on feet: Secondary | ICD-10-CM | POA: Diagnosis not present

## 2022-10-09 DIAGNOSIS — R2689 Other abnormalities of gait and mobility: Secondary | ICD-10-CM | POA: Diagnosis not present

## 2022-10-09 DIAGNOSIS — M6281 Muscle weakness (generalized): Secondary | ICD-10-CM | POA: Diagnosis not present

## 2022-10-09 DIAGNOSIS — R29898 Other symptoms and signs involving the musculoskeletal system: Secondary | ICD-10-CM | POA: Diagnosis not present

## 2022-10-09 DIAGNOSIS — R269 Unspecified abnormalities of gait and mobility: Secondary | ICD-10-CM | POA: Diagnosis not present

## 2022-10-09 DIAGNOSIS — R262 Difficulty in walking, not elsewhere classified: Secondary | ICD-10-CM

## 2022-10-09 NOTE — Therapy (Signed)
OUTPATIENT PHYSICAL THERAPY NEURO TREATMENT   Patient Name: Russell Russell MRN: [id number] DOB:[birth year], 72 y.o., male Today's Date: 10/09/2022   PCP: Jarold Motto, PA REFERRING PROVIDER: Rodolph Bong, MD  END OF SESSION:  PT End of Session - 10/09/22 1310     Visit Number 3    Number of Visits 13   Plus eval   Date for PT Re-Evaluation 11/12/22   Due to delay in scheduling   Authorization Type [employer]    Progress Note Due on Visit 10    PT Start Time 1308    PT Stop Time 1357    PT Time Calculation (min) 49 min    Equipment Utilized During Treatment Gait belt   L AFO, L foot up brace, toe cap   Activity Tolerance Patient tolerated treatment well    Behavior During Therapy WFL for tasks assessed/performed                  Past Medical History:  Diagnosis Date   Alcohol abuse    drinks 1 gallon of Gin every weekend, nothing during the week x >15 yrs   Atrial fibrillation (HCC)    Elevated transaminase level    +elev bili: abd u/s 06/2014 showed stable small hepatic hemangiomas, o/w normal.   Essential hypertension    GERD (gastroesophageal reflux disease)    Hyperlipidemia 03/2014   Atorv started-chol improved   Hypertension    Impaired fasting glucose 03/2014   Nephrolithiasis    PUD (peptic ulcer disease)    Stroke (HCC) 2021   Tobacco dependence    chantix: "psych effects"   Past Surgical History:  Procedure Laterality Date   COLONOSCOPY  2005   Recall 10 yrs ([place])   LOOP RECORDER INSERTION N/A 07/03/2019   Procedure: LOOP RECORDER INSERTION;  Surgeon: Hillis Range, MD;  Location: [facility];  Service: Cardiovascular;  Laterality: N/A;   removal of kidney stones  2006   cystoscopic--removed from ureter.  No prob since.   Patient Active Problem List   Diagnosis Date Noted   Renal structural abnormality 08/24/2021   H/O ETOH abuse 04/28/2020   History of tobacco abuse 01/07/2020   Hemiparesis affecting left side as  late effect of stroke (HCC) 01/07/2020   Abnormality of gait 09/24/2019   AKI (acute kidney injury) (HCC)    Fever    Sepsis without acute organ dysfunction (HCC)    Elevated BUN    Blood pressure increase diastolic    Labile blood pressure    Leukopenia    Benign essential HTN    Hypoalbuminemia    Cerebrovascular accident (CVA) of right basal ganglia (HCC) 07/07/2019   Acute ischemic stroke (HCC)    Dyslipidemia    Dysphagia, post-stroke    Polycythemia    Stroke (HCC) 06/30/2019   Prediabetes 12/23/2016   Elevated LFTs 12/23/2016   Pure hypercholesterolemia 12/21/2016   Alcoholism (HCC) 12/21/2016   Eczema 09/18/2013   Essential hypertension 09/18/2013   Gastroesophageal reflux disease without esophagitis 09/18/2013   Tobacco abuse 09/18/2013    ONSET DATE: 08/31/2022 (referral)  REFERRING DIAG: R29.898 (ICD-10-CM) - Weakness of left lower extremity R26.9 (ICD-10-CM) - Abnormality of gait  THERAPY DIAG:  Muscle weakness (generalized)  Unsteadiness on feet  Other abnormalities of gait and mobility  Difficulty in walking, not elsewhere classified  Rationale for Evaluation and Treatment: Rehabilitation  SUBJECTIVE:  SUBJECTIVE STATEMENT: Pt's wife reports that pt's doctor added a BP medication, readings have been better at home (diastolic 80s and 82N). Denies falls or any pain today. Pt with toe cap on his L shoe.  Pt accompanied by:  Wife, [name]  PERTINENT HISTORY: cerebral infarction and cryptogenic stroke.  PAIN:  Are you having pain? No  PRECAUTIONS: Fall  WEIGHT BEARING RESTRICTIONS: No  FALLS: Has patient fallen in last 6 months? No  LIVING ENVIRONMENT: Lives with: lives with their spouse Lives in: House/apartment Stairs: Yes: External: 3 steps; on right going up,  on left going up, and can reach both Has following equipment at home: Quad cane small base, Walker - 2 wheeled, shower chair, and L AFO  PLOF: Needs assistance with homemaking  PATIENT GOALS: "Just strengthen this leg"   OBJECTIVE:   DIAGNOSTIC FINDINGS: MRI of brain from 09/06/22  IMPRESSION: No acute intracranial process. No evidence of acute or subacute infarct.  MRI of brain from 06/2019 IMPRESSION: 1. Multiple scattered acute ischemic infarcts involving the posterior right basal ganglia, right cerebral hemisphere, and right cerebellum as above. No associated hemorrhage or mass effect. 2. Underlying atrophy with advanced chronic microvascular ischemic disease, with multiple remote lacunar infarcts about the bilateral basal ganglia, thalami, and pons, with additional chronic left cerebellar infarct.  COGNITION: Overall cognitive status: Within functional limits for tasks assessed   SENSATION: Pt denies numbness/tingling in his legs    POSTURE: rounded shoulders, forward head, and increased thoracic kyphosis  LOWER EXTREMITY MMT:  Tested in seated position   MMT Right Eval Left Eval  Hip flexion 4+ 4  Hip extension    Hip abduction 4+ 4  Hip adduction 4+ 4  Hip internal rotation    Hip external rotation    Knee flexion 5 4+  Knee extension 5 4-  Ankle dorsiflexion 5 4  Ankle plantarflexion    Ankle inversion    Ankle eversion    (Blank rows = not tested)    VITALS  Vitals:   10/09/22 1319 10/09/22 1320  BP: (!) 139/101 (!) 135/95  Pulse: 76    Seated BP assessed at beginning of session. Pt does remain hypertensive but BP within safe range for participation in therapy session when taken manually. Decision made to proceed with therapy session as pt does have a trend of BP readings slightly lowering now that he has started a new medication to control his BP.***    TODAY'S TREATMENT:         NMR Static stance with RLE on blue dynadisc for LLE  strengthening 3 x 30 sec with one UE support on RW 3 x 30 sec with one fingertip support on RW  TherEx        Standing with RW with BUE support to work on LE strengthening: Mini-squats 3 x 10 reps Alt L/R 6" step taps 3 x 10 reps B  Gait Gait pattern: {gait characteristics:25376} Distance walked: 115 ft Assistive device utilized: Environmental consultant - 2 wheeled Level of assistance: SBA and CGA Comments: scuffing of L foot, cues for L heel strike, decreased L hip and knee flexion  Gait pattern: {gait characteristics:25376} Distance walked: 115 ft Assistive device utilized: {Assistive devices:23999} Level of assistance: {Levels of assistance:24026} Comments: ***with L foot up brace size XL     PATIENT EDUCATION: Education details: Monitoring BP at home, bringing BP cuff to next session, initial HEP *** Person educated: Patient and Spouse Education method: Explanation Education comprehension: verbalized understanding  HOME  EXERCISE PROGRAM: Access Code: [email or web address] URL: [email or web address] Date: 10/03/2022 Prepared by: Alethia Berthold Plaster  Exercises - Sit to Stand Without Arm Support  - 1 x daily - 7 x weekly - 3 sets - 10 reps - Mini Squat with Counter Support  - 1 x daily - 7 x weekly - 3 sets - 10 reps - Standing Forward Step Taps with Counter Support  - 1 x daily - 7 x weekly - 3 sets - 10 reps  GOALS: Goals reviewed with patient? Yes  SHORT TERM GOALS: Target date: 10/15/2022    Pt will be independent with initial HEP for improved strength, balance, transfers and gait.  Baseline: not established on eval  Goal status: INITIAL  2.  Pt will improve normal TUG to less than or equal to 30 seconds w/LRAD and SBA for improved functional mobility and decreased fall risk.  Baseline: 37.32s w/RW and SBA Goal status: INITIAL  3.  Pt will improve 5 x STS to less than or equal to 25 seconds w/equal weightbearing through BLEs to demonstrate improved functional strength and  transfer efficiency.   Baseline: 28.62s w/weight through RLE mostly  Goal status: INITIAL  4.  Pt will improve gait velocity to at least 1.4 ft/s w/LRAD for improved gait efficiency   Baseline: 1.0 ft/s w/RW Goal status: INITIAL  5.  Pt will trial various braces in clinic to determine safest and most efficient option for pt to improve independence w/gait.  Baseline: Has AFO- is to bring to first session  Goal status: INITIAL  LONG TERM GOALS: Target date: 10/29/2022    Pt will be independent with final HEP for improved strength, balance, transfers and gait.  Baseline:  Goal status: INITIAL  2.  Pt will improve gait velocity to at least 1.9 ft/s w/LRAD for improved gait efficiency and reduced fall risk  Baseline: 1.0 ft/s w/RW  Goal status: INITIAL  3.  Pt will improve 5 x STS to less than or equal to 20 w/equal BLE weight acceptance seconds to demonstrate improved functional strength and transfer efficiency.   Baseline: 28.62s w/weight through RLE mostly  Goal status: INITIAL  4.  Pt will improve normal TUG to less than or equal to 25 seconds w/LRAD for improved functional mobility and decreased fall risk.  Baseline: 37.32s w/RW and SBA Goal status: INITIAL   ASSESSMENT:  CLINICAL IMPRESSION: Emphasis of skilled PT session*** Continue POC.    OBJECTIVE IMPAIRMENTS: Abnormal gait, decreased activity tolerance, decreased balance, decreased endurance, decreased mobility, difficulty walking, and decreased strength  ACTIVITY LIMITATIONS: carrying, lifting, squatting, stairs, transfers, and locomotion level  PARTICIPATION LIMITATIONS: meal prep, cleaning, laundry, medication management, driving, shopping, community activity, and yard work  PERSONAL FACTORS: Age, Fitness, Past/current experiences, and 1 comorbidity: cryptogenic stroke  are also affecting patient's functional outcome.   REHAB POTENTIAL: Fair due to chronicity of CVA and LLE weakness  CLINICAL DECISION  MAKING: Stable/uncomplicated  EVALUATION COMPLEXITY: Low  PLAN:  PT FREQUENCY: 1-2x/week  PT DURATION: 6 weeks  PLANNED INTERVENTIONS: Therapeutic exercises, Therapeutic activity, Neuromuscular re-education, Balance training, Gait training, Patient/Family education, Self Care, Joint mobilization, Stair training, Orthotic/Fit training, DME instructions, Aquatic Therapy, Dry Needling, Manual therapy, and Re-evaluation  PLAN FOR NEXT SESSION: Monitor BP. trial Bioness to L quads. Eccentric control of L quad, sit to stands w/weight shift to LLE. Establish HEP for LLE strength and endurance, try foot up brace again at end of session when more fatigued to make sure it is still helpful (handout provided 7/16  but wife going to wait to purchase until we try it one more time--pt also to reach out to [employer] about getting a toe cap on his blue shoes)***modified Thomas stretch, work on upright trunk with gait    Peter Congo, PT, DPT, [facility] [address] [place], [place]  [address] Phone:  [phone] Fax:  [phone]  10/09/2022, 1:59 PM

## 2022-10-11 ENCOUNTER — Ambulatory Visit: Payer: Medicare HMO | Attending: Family Medicine | Admitting: Physical Therapy

## 2022-10-11 DIAGNOSIS — M6281 Muscle weakness (generalized): Secondary | ICD-10-CM | POA: Diagnosis not present

## 2022-10-11 DIAGNOSIS — R2681 Unsteadiness on feet: Secondary | ICD-10-CM | POA: Insufficient documentation

## 2022-10-11 DIAGNOSIS — R2689 Other abnormalities of gait and mobility: Secondary | ICD-10-CM | POA: Diagnosis not present

## 2022-10-11 DIAGNOSIS — R278 Other lack of coordination: Secondary | ICD-10-CM | POA: Insufficient documentation

## 2022-10-11 DIAGNOSIS — R262 Difficulty in walking, not elsewhere classified: Secondary | ICD-10-CM | POA: Insufficient documentation

## 2022-10-11 NOTE — Therapy (Signed)
OUTPATIENT PHYSICAL THERAPY NEURO TREATMENT   Patient Name: Russell Russell MRN: [id number] DOB:August 17, 1950, 72 y.o., male Today's Date: 10/11/2022   PCP: Jarold Motto, PA REFERRING PROVIDER: Rodolph Bong, MD  END OF SESSION:  PT End of Session - 10/11/22 1317     Visit Number 4    Number of Visits 13   Plus eval   Date for PT Re-Evaluation 11/12/22   Due to delay in scheduling   Authorization Type [employer]    Progress Note Due on Visit 10    PT Start Time 1315    PT Stop Time 1402    PT Time Calculation (min) 47 min    Equipment Utilized During Treatment Gait belt   L AFO, L foot up brace, toe cap   Activity Tolerance Patient tolerated treatment well    Behavior During Therapy WFL for tasks assessed/performed                   Past Medical History:  Diagnosis Date   Alcohol abuse    drinks 1 gallon of Gin every weekend, nothing during the week x >15 yrs   Atrial fibrillation (HCC)    Elevated transaminase level    +elev bili: abd u/s 06/2014 showed stable small hepatic hemangiomas, o/w normal.   Essential hypertension    GERD (gastroesophageal reflux disease)    Hyperlipidemia 03/2014   Atorv started-chol improved   Hypertension    Impaired fasting glucose 03/2014   Nephrolithiasis    PUD (peptic ulcer disease)    Stroke (HCC) 2021   Tobacco dependence    chantix: "psych effects"   Past Surgical History:  Procedure Laterality Date   COLONOSCOPY  2005   Recall 10 yrs ([place])   LOOP RECORDER INSERTION N/A 07/03/2019   Procedure: LOOP RECORDER INSERTION;  Surgeon: Hillis Range, MD;  Location: [facility];  Service: Cardiovascular;  Laterality: N/A;   removal of kidney stones  2006   cystoscopic--removed from ureter.  No prob since.   Patient Active Problem List   Diagnosis Date Noted   Renal structural abnormality 08/24/2021   H/O ETOH abuse 04/28/2020   History of tobacco abuse 01/07/2020   Hemiparesis affecting left side as  late effect of stroke (HCC) 01/07/2020   Abnormality of gait 09/24/2019   AKI (acute kidney injury) (HCC)    Fever    Sepsis without acute organ dysfunction (HCC)    Elevated BUN    Blood pressure increase diastolic    Labile blood pressure    Leukopenia    Benign essential HTN    Hypoalbuminemia    Cerebrovascular accident (CVA) of right basal ganglia (HCC) 07/07/2019   Acute ischemic stroke (HCC)    Dyslipidemia    Dysphagia, post-stroke    Polycythemia    Stroke (HCC) 06/30/2019   Prediabetes 12/23/2016   Elevated LFTs 12/23/2016   Pure hypercholesterolemia 12/21/2016   Alcoholism (HCC) 12/21/2016   Eczema 09/18/2013   Essential hypertension 09/18/2013   Gastroesophageal reflux disease without esophagitis 09/18/2013   Tobacco abuse 09/18/2013    ONSET DATE: 08/31/2022 (referral)  REFERRING DIAG: R29.898 (ICD-10-CM) - Weakness of left lower extremity R26.9 (ICD-10-CM) - Abnormality of gait  THERAPY DIAG:  Muscle weakness (generalized)  Unsteadiness on feet  Other abnormalities of gait and mobility  Difficulty in walking, not elsewhere classified  Rationale for Evaluation and Treatment: Rehabilitation  SUBJECTIVE:  SUBJECTIVE STATEMENT: Pt reports no acute changes since last visit, no pain today. Pt was tired after last visit. Pt's wife reports that his BP was 120/88 earlier today.  Pt accompanied by:  Wife, [name]  PERTINENT HISTORY: cerebral infarction and cryptogenic stroke.  PAIN:  Are you having pain? No  PRECAUTIONS: Fall  WEIGHT BEARING RESTRICTIONS: No  FALLS: Has patient fallen in last 6 months? No  LIVING ENVIRONMENT: Lives with: lives with their spouse Lives in: House/apartment Stairs: Yes: External: 3 steps; on right going up, on left going up, and can reach  both Has following equipment at home: Quad cane small base, Walker - 2 wheeled, shower chair, and L AFO  PLOF: Needs assistance with homemaking  PATIENT GOALS: "Just strengthen this leg"   OBJECTIVE:   DIAGNOSTIC FINDINGS: MRI of brain from 09/06/22  IMPRESSION: No acute intracranial process. No evidence of acute or subacute infarct.  MRI of brain from 06/2019 IMPRESSION: 1. Multiple scattered acute ischemic infarcts involving the posterior right basal ganglia, right cerebral hemisphere, and right cerebellum as above. No associated hemorrhage or mass effect. 2. Underlying atrophy with advanced chronic microvascular ischemic disease, with multiple remote lacunar infarcts about the bilateral basal ganglia, thalami, and pons, with additional chronic left cerebellar infarct.  COGNITION: Overall cognitive status: Within functional limits for tasks assessed   SENSATION: Pt denies numbness/tingling in his legs    POSTURE: rounded shoulders, forward head, and increased thoracic kyphosis  LOWER EXTREMITY MMT:  Tested in seated position   MMT Right Eval Left Eval  Hip flexion 4+ 4  Hip extension    Hip abduction 4+ 4  Hip adduction 4+ 4  Hip internal rotation    Hip external rotation    Knee flexion 5 4+  Knee extension 5 4-  Ankle dorsiflexion 5 4  Ankle plantarflexion    Ankle inversion    Ankle eversion    (Blank rows = not tested)    VITALS  There were no vitals filed for this visit.  BP not assessed this session, pt's wife reports his BP was 120/88 earlier today and pt remains asymptomatic.   TODAY'S TREATMENT:          TherEx        Supine modified Thomas stretch 3 x 30 sec each B for hip flexor stretch, added to HEP (see bolded below)  TherAct Standing alt L/R soccer ball kicks with LUE support on RW and RUE HHA with therapist forearm, pt initially with decreased stability standing on LLE but improves with addition of UE support.  6 Blaze pods on random  setting for improved standing tolerance, reaching outside BOS, and SLS stability/LE coordination.  Performed on 1 minute intervals with 15 rest periods.  Pt requires CGA guarding. Round 1:  2 on ground, 4 on mirror setup.  13 hits. Round 2:  2 on ground, 4 on mirror  setup.  21 hits. Round 3:  2 on ground, 4 on mirror  setup.  22 hits. Notable errors/deficits:  increased time needed to scan for target Round 1:  2 on ground, 4 on mirror  setup.  23 hits. Round 2:  2 on ground, 4 on mirror  setup.  24 hits. Round 3:  2 on ground, 4 on mirror  setup.  25 hits. Notable errors/deficits:  increased time needed to scan for target Round 1:  straight line setup.  18 hits. Round 2:  straight line setup.  16 hits. Round 3:  straight line  setup.  20 hits.   PATIENT EDUCATION: Education details: continue to monitor BP at home, added to HEP Person educated: Patient and Spouse Education method: Explanation, Demonstration, and Handouts Education comprehension: verbalized understanding, returned demonstration, and needs further education  HOME EXERCISE PROGRAM: Access Code: [email or web address] URL: [email or web address] Date: 10/03/2022 Prepared by: Alethia Berthold Plaster  Exercises - Sit to Stand Without Arm Support  - 1 x daily - 7 x weekly - 3 sets - 10 reps - Mini Squat with Counter Support  - 1 x daily - 7 x weekly - 3 sets - 10 reps - Standing Forward Step Taps with Counter Support  - 1 x daily - 7 x weekly - 3 sets - 10 reps - Modified Thomas Stretch  - 1 x daily - 7 x weekly - 1 sets - 3-5 reps - 30 sec hold  GOALS: Goals reviewed with patient? Yes  SHORT TERM GOALS: Target date: 10/15/2022  Pt will be independent with initial HEP for improved strength, balance, transfers and gait. Baseline: not established on eval  Goal status: INITIAL  2.  Pt will improve normal TUG to less than or equal to 30 seconds w/LRAD and SBA for improved functional mobility and decreased fall risk. Baseline:  37.32s w/RW and SBA Goal status: INITIAL  3.  Pt will improve 5 x STS to less than or equal to 25 seconds w/equal weightbearing through BLEs to demonstrate improved functional strength and transfer efficiency.  Baseline: 28.62s w/weight through RLE mostly  Goal status: INITIAL  4.  Pt will improve gait velocity to at least 1.4 ft/s w/LRAD for improved gait efficiency  Baseline: 1.0 ft/s w/RW Goal status: INITIAL  5.  Pt will trial various braces in clinic to determine safest and most efficient option for pt to improve independence w/gait.  Baseline: Has AFO- is to bring to first session  Goal status: INITIAL  LONG TERM GOALS: Target date: 10/29/2022    Pt will be independent with final HEP for improved strength, balance, transfers and gait. Baseline:  Goal status: INITIAL  2.  Pt will improve gait velocity to at least 1.9 ft/s w/LRAD for improved gait efficiency and reduced fall risk Baseline: 1.0 ft/s w/RW  Goal status: INITIAL  3.  Pt will improve 5 x STS to less than or equal to 20 w/equal BLE weight acceptance seconds to demonstrate improved functional strength and transfer efficiency.  Baseline: 28.62s w/weight through RLE mostly  Goal status: INITIAL  4.  Pt will improve normal TUG to less than or equal to 25 seconds w/LRAD for improved functional mobility and decreased fall risk. Baseline: 37.32s w/RW and SBA Goal status: INITIAL   ASSESSMENT:  CLINICAL IMPRESSION: Emphasis of skilled PT session on working on hip flexor stretching and tasks involving reaching to encourage more upright trunk during gait and standing as well as working on scanning R/L, reaching outside BOS, and SLS stability. Pt exhibits improved ability to stand upright with RW following stretching and reaching tasks. Pt does fatigue quickly with blaze pod tasks and needs frequent rest breaks to recover. Pt continues to benefit from skilled therapy services to work towards LTGs. Continue  POC.    OBJECTIVE IMPAIRMENTS: Abnormal gait, decreased activity tolerance, decreased balance, decreased endurance, decreased mobility, difficulty walking, and decreased strength  ACTIVITY LIMITATIONS: carrying, lifting, squatting, stairs, transfers, and locomotion level  PARTICIPATION LIMITATIONS: meal prep, cleaning, laundry, medication management, driving, shopping, community activity, and yard work  PERSONAL FACTORS: Age, Fitness, Past/current experiences, and 1 comorbidity:  cryptogenic stroke  are also affecting patient's functional outcome.   REHAB POTENTIAL: Fair due to chronicity of CVA and LLE weakness  CLINICAL DECISION MAKING: Stable/uncomplicated  EVALUATION COMPLEXITY: Low  PLAN:  PT FREQUENCY: 1-2x/week  PT DURATION: 6 weeks  PLANNED INTERVENTIONS: Therapeutic exercises, Therapeutic activity, Neuromuscular re-education, Balance training, Gait training, Patient/Family education, Self Care, Joint mobilization, Stair training, Orthotic/Fit training, DME instructions, Aquatic Therapy, Dry Needling, Manual therapy, and Re-evaluation  PLAN FOR NEXT SESSION: Monitor BP (may have to check manually). trial Bioness to L quads. Eccentric control of L quad, sit to stands w/weight shift to LLE. Add to HEP for LLE strength and endurance, continue to work on upright trunk with gait, Blaze pods for reaching outside BOS and across midline    Peter Congo, PT, DPT, [facility] [address] [place], [place]  [address] Phone:  [phone] Fax:  [phone]  10/11/2022, 2:04 PM

## 2022-10-14 LAB — CUP PACEART REMOTE DEVICE CHECK
Date Time Interrogation Session: 20240803230330
Implantable Pulse Generator Implant Date: 20210423

## 2022-10-15 ENCOUNTER — Ambulatory Visit: Payer: Medicare HMO

## 2022-10-15 DIAGNOSIS — I639 Cerebral infarction, unspecified: Secondary | ICD-10-CM | POA: Diagnosis not present

## 2022-10-16 ENCOUNTER — Ambulatory Visit: Payer: Medicare HMO | Admitting: Physical Therapy

## 2022-10-16 DIAGNOSIS — M6281 Muscle weakness (generalized): Secondary | ICD-10-CM | POA: Diagnosis not present

## 2022-10-16 DIAGNOSIS — R2689 Other abnormalities of gait and mobility: Secondary | ICD-10-CM

## 2022-10-16 DIAGNOSIS — R262 Difficulty in walking, not elsewhere classified: Secondary | ICD-10-CM | POA: Diagnosis not present

## 2022-10-16 DIAGNOSIS — R2681 Unsteadiness on feet: Secondary | ICD-10-CM

## 2022-10-16 DIAGNOSIS — R278 Other lack of coordination: Secondary | ICD-10-CM | POA: Diagnosis not present

## 2022-10-16 NOTE — Therapy (Signed)
OUTPATIENT PHYSICAL THERAPY NEURO TREATMENT   Patient Name: Russell Russell MRN: [id number] DOB:17-Oct-1950, 72 y.o., male Today's Date: 10/16/2022   PCP: Jarold Motto, PA REFERRING PROVIDER: Rodolph Bong, MD  END OF SESSION:  PT End of Session - 10/16/22 1236     Visit Number 5    Number of Visits 13   Plus eval   Date for PT Re-Evaluation 11/12/22   Due to delay in scheduling   Authorization Type [employer]    Progress Note Due on Visit 10    PT Start Time 1233    PT Stop Time 1315    PT Time Calculation (min) 42 min    Equipment Utilized During Treatment Gait belt;Other (comment)   L toe cap   Activity Tolerance Patient tolerated treatment well    Behavior During Therapy WFL for tasks assessed/performed                    Past Medical History:  Diagnosis Date   Alcohol abuse    drinks 1 gallon of Gin every weekend, nothing during the week x >15 yrs   Atrial fibrillation (HCC)    Elevated transaminase level    +elev bili: abd u/s 06/2014 showed stable small hepatic hemangiomas, o/w normal.   Essential hypertension    GERD (gastroesophageal reflux disease)    Hyperlipidemia 03/2014   Atorv started-chol improved   Hypertension    Impaired fasting glucose 03/2014   Nephrolithiasis    PUD (peptic ulcer disease)    Stroke (HCC) 2021   Tobacco dependence    chantix: "psych effects"   Past Surgical History:  Procedure Laterality Date   COLONOSCOPY  2005   Recall 10 yrs ([place])   LOOP RECORDER INSERTION N/A 07/03/2019   Procedure: LOOP RECORDER INSERTION;  Surgeon: Hillis Range, MD;  Location: [facility];  Service: Cardiovascular;  Laterality: N/A;   removal of kidney stones  2006   cystoscopic--removed from ureter.  No prob since.   Patient Active Problem List   Diagnosis Date Noted   Renal structural abnormality 08/24/2021   H/O ETOH abuse 04/28/2020   History of tobacco abuse 01/07/2020   Hemiparesis affecting left side as late  effect of stroke (HCC) 01/07/2020   Abnormality of gait 09/24/2019   AKI (acute kidney injury) (HCC)    Fever    Sepsis without acute organ dysfunction (HCC)    Elevated BUN    Blood pressure increase diastolic    Labile blood pressure    Leukopenia    Benign essential HTN    Hypoalbuminemia    Cerebrovascular accident (CVA) of right basal ganglia (HCC) 07/07/2019   Acute ischemic stroke (HCC)    Dyslipidemia    Dysphagia, post-stroke    Polycythemia    Stroke (HCC) 06/30/2019   Prediabetes 12/23/2016   Elevated LFTs 12/23/2016   Pure hypercholesterolemia 12/21/2016   Alcoholism (HCC) 12/21/2016   Eczema 09/18/2013   Essential hypertension 09/18/2013   Gastroesophageal reflux disease without esophagitis 09/18/2013   Tobacco abuse 09/18/2013    ONSET DATE: 08/31/2022 (referral)  REFERRING DIAG: R29.898 (ICD-10-CM) - Weakness of left lower extremity R26.9 (ICD-10-CM) - Abnormality of gait  THERAPY DIAG:  Muscle weakness (generalized)  Unsteadiness on feet  Other abnormalities of gait and mobility  Rationale for Evaluation and Treatment: Rehabilitation  SUBJECTIVE:  SUBJECTIVE STATEMENT: Pt reports no acute changes since last visit, no pain today. States his sit to stands are easy at home.   Pt accompanied by:  Wife, [name]  PERTINENT HISTORY: cerebral infarction and cryptogenic stroke.  PAIN:  Are you having pain? No  PRECAUTIONS: Fall  WEIGHT BEARING RESTRICTIONS: No  FALLS: Has patient fallen in last 6 months? No  LIVING ENVIRONMENT: Lives with: lives with their spouse Lives in: House/apartment Stairs: Yes: External: 3 steps; on right going up, on left going up, and can reach both Has following equipment at home: Quad cane small base, Walker - 2 wheeled, shower chair, and  L AFO  PLOF: Needs assistance with homemaking  PATIENT GOALS: "Just strengthen this leg"   OBJECTIVE:   DIAGNOSTIC FINDINGS: MRI of brain from 09/06/22  IMPRESSION: No acute intracranial process. No evidence of acute or subacute infarct.  MRI of brain from 06/2019 IMPRESSION: 1. Multiple scattered acute ischemic infarcts involving the posterior right basal ganglia, right cerebral hemisphere, and right cerebellum as above. No associated hemorrhage or mass effect. 2. Underlying atrophy with advanced chronic microvascular ischemic disease, with multiple remote lacunar infarcts about the bilateral basal ganglia, thalami, and pons, with additional chronic left cerebellar infarct.  COGNITION: Overall cognitive status: Within functional limits for tasks assessed   SENSATION: Pt denies numbness/tingling in his legs    POSTURE: rounded shoulders, forward head, and increased thoracic kyphosis  LOWER EXTREMITY MMT:  Tested in seated position   MMT Right Eval Left Eval  Hip flexion 4+ 4  Hip extension    Hip abduction 4+ 4  Hip adduction 4+ 4  Hip internal rotation    Hip external rotation    Knee flexion 5 4+  Knee extension 5 4-  Ankle dorsiflexion 5 4  Ankle plantarflexion    Ankle inversion    Ankle eversion    (Blank rows = not tested)    VITALS  There were no vitals filed for this visit.  BP not assessed this session, pt's wife reports his BP was 133/88 earlier today and pt remains asymptomatic.   TODAY'S TREATMENT:          TherAct STG Assessment    [facility] PT Assessment - 10/16/22 1242       Transfers   Five time sit to stand comments  19.63s   no UE support     Ambulation/Gait   Gait velocity 32.8' over 30.65s = 1.07 ft/s w/RW      Timed Up and Go Test   Normal TUG (seconds) 34.65   w/RW            NMR  Reviewed sit to stands from HEP and noted pt still shifting weight to R side and not utilizing LLE. Cued pt to shift weight to LLE as able,  but pt unable to do so.  Staggered sit to stands w/RLE on 2" step x12 reps, for improved weight shift to LLE. Pt challenged by this and required RUE support initially, but quickly was able to perform without UE support. However, noted pt still shifting to RLE and rotating trunk to R side.  Progressed to RLE on green dynadisc, x20 reps, to provide external cue on weightbearing and facilitate shift to LLE. Pt required RUE support on >75% of reps due to difficulty w/task. Pt very challenged by this and was only able to clear hips from mat table when shifting weight equally through BLEs rather than stand to full extension. Pt reported 8/10 fatigue,  so halted exercise for today.  Lateral/fwd/retro monster walks w/green theraband around distal quads, x3 each direction w/counter support for increased hip strength and step clearance, SBA throughout. Pt most challenged by retro stepping w/LLE. Min cues to avoid sliding LLE on ground throughout. Added to HEP (see bolded below)   RPE of 9/10 following session    PATIENT EDUCATION: Education details: Additions to HEP, importance of shifting weight to LLE at home when performing sit to stands  Person educated: Patient and Spouse Education method: Explanation, Demonstration, and Handouts Education comprehension: verbalized understanding, returned demonstration, and needs further education  HOME EXERCISE PROGRAM: Access Code: [email or web address] URL: [email or web address] Date: 10/03/2022 Prepared by: Alethia Berthold   Exercises - Sit to Stand Without Arm Support  - 1 x daily - 7 x weekly - 3 sets - 10 reps - Mini Squat with Counter Support  - 1 x daily - 7 x weekly - 3 sets - 10 reps - Standing Forward Step Taps with Counter Support  - 1 x daily - 7 x weekly - 3 sets - 10 reps - Modified Thomas Stretch  - 1 x daily - 7 x weekly - 1 sets - 3-5 reps - 30 sec hold - Side Stepping with Resistance at [employer] and Counter Support  - 1 x daily - 7 x weekly - 3  sets - 10 reps - Forward Backward Monster Walk with Band at [employer] and Counter Support  - 1 x daily - 7 x weekly - 3 sets - 10 reps  GOALS: Goals reviewed with patient? Yes  SHORT TERM GOALS: Target date: 10/15/2022  Pt will be independent with initial HEP for improved strength, balance, transfers and gait. Baseline: not established on eval  Goal status: MET   2.  Pt will improve normal TUG to less than or equal to 30 seconds w/LRAD and SBA for improved functional mobility and decreased fall risk. Baseline: 37.32s w/RW and SBA; 34.65s w/RW and SBA Goal status: NOT MET  3.  Pt will improve 5 x STS to less than or equal to 25 seconds w/equal weightbearing through BLEs to demonstrate improved functional strength and transfer efficiency.  Baseline: 28.62s w/weight through RLE mostly; 19.63s w/equal weightshift  Goal status: MET  4.  Pt will improve gait velocity to at least 1.4 ft/s w/LRAD for improved gait efficiency  Baseline: 1.0 ft/s w/RW; 1.07 ft/s w/RW  Goal status: NOT MET   5.  Pt will trial various braces in clinic to determine safest and most efficient option for pt to improve independence w/gait.  Baseline: Has AFO- is to bring to first session  Goal status: MET  LONG TERM GOALS: Target date: 10/29/2022    Pt will be independent with final HEP for improved strength, balance, transfers and gait. Baseline:  Goal status: INITIAL  2.  Pt will improve gait velocity to at least 1.9 ft/s w/LRAD for improved gait efficiency and reduced fall risk Baseline: 1.0 ft/s w/RW  Goal status: INITIAL  3.  Pt will improve 5 x STS to less than or equal to 16s w/equal BLE weight acceptance seconds to demonstrate improved functional strength and transfer efficiency.  Baseline: 28.62s w/weight through RLE mostly' 19.63s w/equal weight shift (8/6) Goal status: INITIAL  4.  Pt will improve normal TUG to less than or equal to 25 seconds w/LRAD for improved functional mobility and decreased fall  risk. Baseline: 37.32s w/RW and SBA Goal status: INITIAL   ASSESSMENT:  CLINICAL IMPRESSION: Emphasis of skilled PT session  on STG assessment, weight shifting to LLE and global strength. Pt has met 3 of 5 STGs, performing his HEP regularly, trialing out various braces on LLE and significantly improving his time on 5x STS without UE support. Pt has improved his gait speed and TUG, just not to goal level. Pt's progress has been limited due to hypertension, causing him to miss PT appointments, but is now better controlled. Noted pt still shifting weight to R side w/sit to stands, so majority of session spent facilitating shift to LLE, which is much more difficult for pt. Encouraged pt to focus on this when performing at home, as the exercise is only easy if he is not using his LLE. Continue POC.     OBJECTIVE IMPAIRMENTS: Abnormal gait, decreased activity tolerance, decreased balance, decreased endurance, decreased mobility, difficulty walking, and decreased strength  ACTIVITY LIMITATIONS: carrying, lifting, squatting, stairs, transfers, and locomotion level  PARTICIPATION LIMITATIONS: meal prep, cleaning, laundry, medication management, driving, shopping, community activity, and yard work  PERSONAL FACTORS: Age, Fitness, Past/current experiences, and 1 comorbidity: cryptogenic stroke  are also affecting patient's functional outcome.   REHAB POTENTIAL: Fair due to chronicity of CVA and LLE weakness  CLINICAL DECISION MAKING: Stable/uncomplicated  EVALUATION COMPLEXITY: Low  PLAN:  PT FREQUENCY: 1-2x/week  PT DURATION: 6 weeks  PLANNED INTERVENTIONS: Therapeutic exercises, Therapeutic activity, Neuromuscular re-education, Balance training, Gait training, Patient/Family education, Self Care, Joint mobilization, Stair training, Orthotic/Fit training, DME instructions, Aquatic Therapy, Dry Needling, Manual therapy, and Re-evaluation  PLAN FOR NEXT SESSION: Monitor BP (may have to check  manually). trial Bioness to L quads. Eccentric control of L quad, sit to stands w/weight shift to LLE. Add to HEP for LLE strength and endurance, continue to work on upright trunk with gait, Blaze pods for reaching outside BOS and across midline     Josephine Igo, PT, DPT [facility] [address] [place], [place]  [address] Phone:  [phone] Fax:  [phone] 10/16/2022, 1:17 PM

## 2022-10-18 ENCOUNTER — Ambulatory Visit: Payer: Medicare HMO | Admitting: Physical Therapy

## 2022-10-22 ENCOUNTER — Ambulatory Visit: Payer: Medicare HMO | Admitting: Physical Therapy

## 2022-10-22 VITALS — BP 130/93 | HR 78

## 2022-10-22 DIAGNOSIS — R2681 Unsteadiness on feet: Secondary | ICD-10-CM | POA: Diagnosis not present

## 2022-10-22 DIAGNOSIS — M6281 Muscle weakness (generalized): Secondary | ICD-10-CM | POA: Diagnosis not present

## 2022-10-22 DIAGNOSIS — R2689 Other abnormalities of gait and mobility: Secondary | ICD-10-CM | POA: Diagnosis not present

## 2022-10-22 DIAGNOSIS — R278 Other lack of coordination: Secondary | ICD-10-CM | POA: Diagnosis not present

## 2022-10-22 DIAGNOSIS — R262 Difficulty in walking, not elsewhere classified: Secondary | ICD-10-CM | POA: Diagnosis not present

## 2022-10-22 NOTE — Therapy (Signed)
OUTPATIENT PHYSICAL THERAPY NEURO TREATMENT   Patient Name: Russell Russell MRN: [id number] DOB:1950-06-24, 72 y.o., male Today's Date: 10/22/2022   PCP: Jarold Motto, PA REFERRING PROVIDER: Rodolph Bong, MD  END OF SESSION:  PT End of Session - 10/22/22 1234     Visit Number 6    Number of Visits 13   Plus eval   Date for PT Re-Evaluation 11/12/22   Due to delay in scheduling   Authorization Type [employer]    Progress Note Due on Visit 10    PT Start Time 1232    PT Stop Time 1312    PT Time Calculation (min) 40 min    Equipment Utilized During Treatment Gait belt;Other (comment)   L toe cap   Activity Tolerance Patient tolerated treatment well    Behavior During Therapy WFL for tasks assessed/performed                     Past Medical History:  Diagnosis Date   Alcohol abuse    drinks 1 gallon of Gin every weekend, nothing during the week x >15 yrs   Atrial fibrillation (HCC)    Elevated transaminase level    +elev bili: abd u/s 06/2014 showed stable small hepatic hemangiomas, o/w normal.   Essential hypertension    GERD (gastroesophageal reflux disease)    Hyperlipidemia 03/2014   Atorv started-chol improved   Hypertension    Impaired fasting glucose 03/2014   Nephrolithiasis    PUD (peptic ulcer disease)    Stroke (HCC) 2021   Tobacco dependence    chantix: "psych effects"   Past Surgical History:  Procedure Laterality Date   COLONOSCOPY  2005   Recall 10 yrs ([place])   LOOP RECORDER INSERTION N/A 07/03/2019   Procedure: LOOP RECORDER INSERTION;  Surgeon: Hillis Range, MD;  Location: [facility];  Service: Cardiovascular;  Laterality: N/A;   removal of kidney stones  2006   cystoscopic--removed from ureter.  No prob since.   Patient Active Problem List   Diagnosis Date Noted   Renal structural abnormality 08/24/2021   H/O ETOH abuse 04/28/2020   History of tobacco abuse 01/07/2020   Hemiparesis affecting left side as  late effect of stroke (HCC) 01/07/2020   Abnormality of gait 09/24/2019   AKI (acute kidney injury) (HCC)    Fever    Sepsis without acute organ dysfunction (HCC)    Elevated BUN    Blood pressure increase diastolic    Labile blood pressure    Leukopenia    Benign essential HTN    Hypoalbuminemia    Cerebrovascular accident (CVA) of right basal ganglia (HCC) 07/07/2019   Acute ischemic stroke (HCC)    Dyslipidemia    Dysphagia, post-stroke    Polycythemia    Stroke (HCC) 06/30/2019   Prediabetes 12/23/2016   Elevated LFTs 12/23/2016   Pure hypercholesterolemia 12/21/2016   Alcoholism (HCC) 12/21/2016   Eczema 09/18/2013   Essential hypertension 09/18/2013   Gastroesophageal reflux disease without esophagitis 09/18/2013   Tobacco abuse 09/18/2013    ONSET DATE: 08/31/2022 (referral)  REFERRING DIAG: R29.898 (ICD-10-CM) - Weakness of left lower extremity R26.9 (ICD-10-CM) - Abnormality of gait  THERAPY DIAG:  Muscle weakness (generalized)  Unsteadiness on feet  Other abnormalities of gait and mobility  Rationale for Evaluation and Treatment: Rehabilitation  SUBJECTIVE:  SUBJECTIVE STATEMENT: Pt reports doing well. HEP is good, started working on step ups at home on outdoor steps. No falls.   Pt accompanied by:  Wife, Russell Russell  PERTINENT HISTORY: cerebral infarction and cryptogenic stroke.  PAIN:  Are you having pain? No  PRECAUTIONS: Fall  WEIGHT BEARING RESTRICTIONS: No  FALLS: Has patient fallen in last 6 months? No  LIVING ENVIRONMENT: Lives with: lives with their spouse Lives in: House/apartment Stairs: Yes: External: 3 steps; on right going up, on left going up, and can reach both Has following equipment at home: Quad cane small base, Walker - 2 wheeled, shower chair, and  L AFO  PLOF: Needs assistance with homemaking  PATIENT GOALS: "Just strengthen this leg"   OBJECTIVE:   DIAGNOSTIC FINDINGS: MRI of brain from 09/06/22  IMPRESSION: No acute intracranial process. No evidence of acute or subacute infarct.  MRI of brain from 06/2019 IMPRESSION: 1. Multiple scattered acute ischemic infarcts involving the posterior right basal ganglia, right cerebral hemisphere, and right cerebellum as above. No associated hemorrhage or mass effect. 2. Underlying atrophy with advanced chronic microvascular ischemic disease, with multiple remote lacunar infarcts about the bilateral basal ganglia, thalami, and pons, with additional chronic left cerebellar infarct.  COGNITION: Overall cognitive status: Within functional limits for tasks assessed   SENSATION: Pt denies numbness/tingling in his legs    POSTURE: rounded shoulders, forward head, and increased thoracic kyphosis  LOWER EXTREMITY MMT:  Tested in seated position   MMT Right Eval Left Eval  Hip flexion 4+ 4  Hip extension    Hip abduction 4+ 4  Hip adduction 4+ 4  Hip internal rotation    Hip external rotation    Knee flexion 5 4+  Knee extension 5 4-  Ankle dorsiflexion 5 4  Ankle plantarflexion    Ankle inversion    Ankle eversion    (Blank rows = not tested)    VITALS  Vitals:   10/22/22 1237  BP: (!) 130/93  Pulse: 78    TODAY'S TREATMENT:          Ther Act  Assessed vitals (see above) and WNL for therapy   NMR  In // bars for improved lateral weightshifting, midline orientation and L hip flexor strength. CGA throughout: Standing on rocker board in L/R direction w/intermittent UE support, x3 minute. Pt frequently losing balance to L side but demonstrating good righting reactions  Mini squats on board, 2x5 reps w/intermittent UE support. Mod verbal cues for proper form. RPE of 8/10 Fwd/retro gait w/light UE support, x4 reps. Pt hesitant to try due to fear of falling and  demonstrated decreased step length/clearance of LLE throughout.  Resisted toe taps to 6" steps using red theraband pulling LLE posterolaterally to L side, x15 reps. Min cues to avoid sliding foot off step and increased eccentric control.  Progressed to x12 reps in lateral direction w/BUE support. Pt relying on ER position of LLE to perform  Resisted step ups to 6" step w/red theraband pulling LLE posterolaterally to L, x18 reps leading w/LLE w/BUE support.  Gait x230' w/6# ankle weight on LLE and RW for improved step length/clearance of LLE. W/fatigue, pt sliding his LLE forward   RPE of 8/10 following session    PATIENT EDUCATION: Education details: Continue HEP, performing monster walks at home w/band around ankles rather than quads to imitate today's session.  Person educated: Patient and Spouse Education method: Medical illustrator Education comprehension: verbalized understanding, returned demonstration, and needs further education  HOME EXERCISE PROGRAM: Access Code: [email or web address] URL: [email or web address] Date: 10/03/2022 Prepared by: Alethia Berthold   Exercises - Sit to Stand Without Arm Support  - 1 x daily - 7 x weekly - 3 sets - 10 reps - Mini Squat with Counter Support  - 1 x daily - 7 x weekly - 3 sets - 10 reps - Standing Forward Step Taps with Counter Support  - 1 x daily - 7 x weekly - 3 sets - 10 reps - Modified Thomas Stretch  - 1 x daily - 7 x weekly - 1 sets - 3-5 reps - 30 sec hold - Side Stepping with Resistance at [employer] and Counter Support  - 1 x daily - 7 x weekly - 3 sets - 10 reps - Forward Backward Monster Walk with Band at [employer] and Counter Support  - 1 x daily - 7 x weekly - 3 sets - 10 reps  GOALS: Goals reviewed with patient? Yes  SHORT TERM GOALS: Target date: 10/15/2022  Pt will be independent with initial HEP for improved strength, balance, transfers and gait. Baseline: not established on eval  Goal status: MET   2.  Pt will  improve normal TUG to less than or equal to 30 seconds w/LRAD and SBA for improved functional mobility and decreased fall risk. Baseline: 37.32s w/RW and SBA; 34.65s w/RW and SBA Goal status: NOT MET  3.  Pt will improve 5 x STS to less than or equal to 25 seconds w/equal weightbearing through BLEs to demonstrate improved functional strength and transfer efficiency.  Baseline: 28.62s w/weight through RLE mostly; 19.63s w/equal weightshift  Goal status: MET  4.  Pt will improve gait velocity to at least 1.4 ft/s w/LRAD for improved gait efficiency  Baseline: 1.0 ft/s w/RW; 1.07 ft/s w/RW  Goal status: NOT MET   5.  Pt will trial various braces in clinic to determine safest and most efficient option for pt to improve independence w/gait.  Baseline: Has AFO- is to bring to first session  Goal status: MET  LONG TERM GOALS: Target date: 10/29/2022    Pt will be independent with final HEP for improved strength, balance, transfers and gait. Baseline:  Goal status: INITIAL  2.  Pt will improve gait velocity to at least 1.9 ft/s w/LRAD for improved gait efficiency and reduced fall risk Baseline: 1.0 ft/s w/RW  Goal status: INITIAL  3.  Pt will improve 5 x STS to less than or equal to 16s w/equal BLE weight acceptance seconds to demonstrate improved functional strength and transfer efficiency.  Baseline: 28.62s w/weight through RLE mostly' 19.63s w/equal weight shift (8/6) Goal status: INITIAL  4.  Pt will improve normal TUG to less than or equal to 25 seconds w/LRAD for improved functional mobility and decreased fall risk. Baseline: 37.32s w/RW and SBA Goal status: INITIAL   ASSESSMENT:  CLINICAL IMPRESSION: Emphasis of skilled PT session on lateral weight shifting to promote midline orientation, improved LLE strength and increased step clearance of LLE w/gait. Pt tolerated session well, reporting 8/10 RPE throughout. Pt most challenged by resisted step taps in lateral direction,  relying on ER compensation to perform. Educated pt on how to modify HEP at home to imitate today's session, as pt reports the resisted step taps were helpful. Continue POC.     OBJECTIVE IMPAIRMENTS: Abnormal gait, decreased activity tolerance, decreased balance, decreased endurance, decreased mobility, difficulty walking, and decreased strength  ACTIVITY LIMITATIONS: carrying, lifting, squatting, stairs, transfers, and locomotion level  PARTICIPATION LIMITATIONS: meal  prep, cleaning, laundry, medication management, driving, shopping, community activity, and yard work  PERSONAL FACTORS: Age, Fitness, Past/current experiences, and 1 comorbidity: cryptogenic stroke  are also affecting patient's functional outcome.   REHAB POTENTIAL: Fair due to chronicity of CVA and LLE weakness  CLINICAL DECISION MAKING: Stable/uncomplicated  EVALUATION COMPLEXITY: Low  PLAN:  PT FREQUENCY: 1-2x/week  PT DURATION: 6 weeks  PLANNED INTERVENTIONS: Therapeutic exercises, Therapeutic activity, Neuromuscular re-education, Balance training, Gait training, Patient/Family education, Self Care, Joint mobilization, Stair training, Orthotic/Fit training, DME instructions, Aquatic Therapy, Dry Needling, Manual therapy, and Re-evaluation  PLAN FOR NEXT SESSION: Monitor BP (may have to check manually). trial Bioness to L quads. Eccentric control of L quad, sit to stands w/weight shift to LLE. Add to HEP for LLE strength and endurance, continue to work on upright trunk with gait, Blaze pods for reaching outside BOS and across midline     Josephine Igo, PT, DPT [facility] [address] [place], [place]  [address] Phone:  [phone] Fax:  [phone] 10/22/2022, 1:31 PM

## 2022-10-24 ENCOUNTER — Ambulatory Visit: Payer: Medicare HMO | Admitting: Physical Therapy

## 2022-10-24 DIAGNOSIS — M6281 Muscle weakness (generalized): Secondary | ICD-10-CM | POA: Diagnosis not present

## 2022-10-24 DIAGNOSIS — R2689 Other abnormalities of gait and mobility: Secondary | ICD-10-CM

## 2022-10-24 DIAGNOSIS — R278 Other lack of coordination: Secondary | ICD-10-CM | POA: Diagnosis not present

## 2022-10-24 DIAGNOSIS — R2681 Unsteadiness on feet: Secondary | ICD-10-CM

## 2022-10-24 DIAGNOSIS — R262 Difficulty in walking, not elsewhere classified: Secondary | ICD-10-CM | POA: Diagnosis not present

## 2022-10-24 NOTE — Therapy (Signed)
OUTPATIENT PHYSICAL THERAPY NEURO TREATMENT   Patient Name: Russell Russell MRN: [id number] DOB:1950/08/15, 72 y.o., male Today's Date: 10/24/2022   PCP: Jarold Motto, PA REFERRING PROVIDER: Rodolph Bong, MD  END OF SESSION:  PT End of Session - 10/24/22 1234     Visit Number 7    Number of Visits 13   Plus eval   Date for PT Re-Evaluation 11/12/22   Due to delay in scheduling   Authorization Type [employer]    Progress Note Due on Visit 10    PT Start Time 1231    PT Stop Time 1312    PT Time Calculation (min) 41 min    Equipment Utilized During Treatment Gait belt;Other (comment)   L toe cap   Activity Tolerance Patient tolerated treatment well    Behavior During Therapy WFL for tasks assessed/performed                      Past Medical History:  Diagnosis Date   Alcohol abuse    drinks 1 gallon of Gin every weekend, nothing during the week x >15 yrs   Atrial fibrillation (HCC)    Elevated transaminase level    +elev bili: abd u/s 06/2014 showed stable small hepatic hemangiomas, o/w normal.   Essential hypertension    GERD (gastroesophageal reflux disease)    Hyperlipidemia 03/2014   Atorv started-chol improved   Hypertension    Impaired fasting glucose 03/2014   Nephrolithiasis    PUD (peptic ulcer disease)    Stroke (HCC) 2021   Tobacco dependence    chantix: "psych effects"   Past Surgical History:  Procedure Laterality Date   COLONOSCOPY  2005   Recall 10 yrs ([place])   LOOP RECORDER INSERTION N/A 07/03/2019   Procedure: LOOP RECORDER INSERTION;  Surgeon: Hillis Range, MD;  Location: [facility];  Service: Cardiovascular;  Laterality: N/A;   removal of kidney stones  2006   cystoscopic--removed from ureter.  No prob since.   Patient Active Problem List   Diagnosis Date Noted   Renal structural abnormality 08/24/2021   H/O ETOH abuse 04/28/2020   History of tobacco abuse 01/07/2020   Hemiparesis affecting left side as  late effect of stroke (HCC) 01/07/2020   Abnormality of gait 09/24/2019   AKI (acute kidney injury) (HCC)    Fever    Sepsis without acute organ dysfunction (HCC)    Elevated BUN    Blood pressure increase diastolic    Labile blood pressure    Leukopenia    Benign essential HTN    Hypoalbuminemia    Cerebrovascular accident (CVA) of right basal ganglia (HCC) 07/07/2019   Acute ischemic stroke (HCC)    Dyslipidemia    Dysphagia, post-stroke    Polycythemia    Stroke (HCC) 06/30/2019   Prediabetes 12/23/2016   Elevated LFTs 12/23/2016   Pure hypercholesterolemia 12/21/2016   Alcoholism (HCC) 12/21/2016   Eczema 09/18/2013   Essential hypertension 09/18/2013   Gastroesophageal reflux disease without esophagitis 09/18/2013   Tobacco abuse 09/18/2013    ONSET DATE: 08/31/2022 (referral)  REFERRING DIAG: R29.898 (ICD-10-CM) - Weakness of left lower extremity R26.9 (ICD-10-CM) - Abnormality of gait  THERAPY DIAG:  Muscle weakness (generalized)  Unsteadiness on feet  Other abnormalities of gait and mobility  Rationale for Evaluation and Treatment: Rehabilitation  SUBJECTIVE:  SUBJECTIVE STATEMENT: Pt reports doing well. HEP is still challenging. Checked his BP prior to session and was good, cannot remember what number it was at. No falls   Pt accompanied by:  Wife, [name]  PERTINENT HISTORY: cerebral infarction and cryptogenic stroke.  PAIN:  Are you having pain? No  PRECAUTIONS: Fall  WEIGHT BEARING RESTRICTIONS: No  FALLS: Has patient fallen in last 6 months? No  LIVING ENVIRONMENT: Lives with: lives with their spouse Lives in: House/apartment Stairs: Yes: External: 3 steps; on right going up, on left going up, and can reach both Has following equipment at home: Quad cane small  base, Walker - 2 wheeled, shower chair, and L AFO  PLOF: Needs assistance with homemaking  PATIENT GOALS: "Just strengthen this leg"   OBJECTIVE:   DIAGNOSTIC FINDINGS: MRI of brain from 09/06/22  IMPRESSION: No acute intracranial process. No evidence of acute or subacute infarct.  MRI of brain from 06/2019 IMPRESSION: 1. Multiple scattered acute ischemic infarcts involving the posterior right basal ganglia, right cerebral hemisphere, and right cerebellum as above. No associated hemorrhage or mass effect. 2. Underlying atrophy with advanced chronic microvascular ischemic disease, with multiple remote lacunar infarcts about the bilateral basal ganglia, thalami, and pons, with additional chronic left cerebellar infarct.  COGNITION: Overall cognitive status: Within functional limits for tasks assessed   SENSATION: Pt denies numbness/tingling in his legs    POSTURE: rounded shoulders, forward head, and increased thoracic kyphosis  LOWER EXTREMITY MMT:  Tested in seated position   MMT Right Eval Left Eval  Hip flexion 4+ 4  Hip extension    Hip abduction 4+ 4  Hip adduction 4+ 4  Hip internal rotation    Hip external rotation    Knee flexion 5 4+  Knee extension 5 4-  Ankle dorsiflexion 5 4  Ankle plantarflexion    Ankle inversion    Ankle eversion    (Blank rows = not tested)    VITALS  There were no vitals filed for this visit.   TODAY'S TREATMENT:          NMR  Spanish squat sit to stands using orange resistance band for improved quad strength: Around both legs, x10 reps. Min cues to avoid using RUE to assist w/transfer   Around LLE only, x10 reps  Around RLE only, x10 reps. Most difficult variation for pt.  Sit to stands w/forefeet elevated on blue wedge, x10 reps, for improved BLE strength, anterior weight shift, immediate standing balance and increased amplitude of sit to stand transfers. Pt initially required RUE support to perform and min A to  stabilize, but quickly progressed to no UE support w/SBA Added 10# ball throws at top of stand for added balance challenge, x4 reps  Added 3 dot targets for pt to throw ball to for added lateral weight shift, x8 reps  RPE of 8/10 following activity  With blue theraband tied around ankles, ambulated 115' w/RW for improved LLE strep clearance and hip abduction strength. Around 3' in, pt very fatigued and had difficulty clearing LLE, requiring CGA for safety. RPE of 10/10  In // bars, 6 Blaze pods on random reach setting for improved LE coordination, side stepping and single leg stability.  Performed on single 2 minute intervals. Pt requires SBA guarding and BUE support. Round 1:  lateral setup.  20 hits. Notable errors/deficits:  Increased forward lean when tapping w/LLE, pt frequently kicking pod w/LLE rather than tapping.  RPE of 10/10 following session  PATIENT EDUCATION: Education details: Continue HEP, reiterated performing monster walks at home w/band around ankles rather than quads to imitate today's session.  Person educated: Patient and Spouse Education method: Medical illustrator Education comprehension: verbalized understanding, returned demonstration, and needs further education  HOME EXERCISE PROGRAM: Access Code: [email or web address] URL: [email or web address] Date: 10/03/2022 Prepared by: Alethia Berthold   Exercises - Sit to Stand Without Arm Support  - 1 x daily - 7 x weekly - 3 sets - 10 reps - Mini Squat with Counter Support  - 1 x daily - 7 x weekly - 3 sets - 10 reps - Standing Forward Step Taps with Counter Support  - 1 x daily - 7 x weekly - 3 sets - 10 reps - Modified Thomas Stretch  - 1 x daily - 7 x weekly - 1 sets - 3-5 reps - 30 sec hold - Side Stepping with Resistance at [employer] and Counter Support  - 1 x daily - 7 x weekly - 3 sets - 10 reps - Forward Backward Monster Walk with Band at [employer] and Counter Support  - 1 x daily - 7 x weekly - 3 sets -  10 reps  GOALS: Goals reviewed with patient? Yes  SHORT TERM GOALS: Target date: 10/15/2022  Pt will be independent with initial HEP for improved strength, balance, transfers and gait. Baseline: not established on eval  Goal status: MET   2.  Pt will improve normal TUG to less than or equal to 30 seconds w/LRAD and SBA for improved functional mobility and decreased fall risk. Baseline: 37.32s w/RW and SBA; 34.65s w/RW and SBA Goal status: NOT MET  3.  Pt will improve 5 x STS to less than or equal to 25 seconds w/equal weightbearing through BLEs to demonstrate improved functional strength and transfer efficiency.  Baseline: 28.62s w/weight through RLE mostly; 19.63s w/equal weightshift  Goal status: MET  4.  Pt will improve gait velocity to at least 1.4 ft/s w/LRAD for improved gait efficiency  Baseline: 1.0 ft/s w/RW; 1.07 ft/s w/RW  Goal status: NOT MET   5.  Pt will trial various braces in clinic to determine safest and most efficient option for pt to improve independence w/gait.  Baseline: Has AFO- is to bring to first session  Goal status: MET  LONG TERM GOALS: Target date: 10/29/2022    Pt will be independent with final HEP for improved strength, balance, transfers and gait. Baseline:  Goal status: INITIAL  2.  Pt will improve gait velocity to at least 1.9 ft/s w/LRAD for improved gait efficiency and reduced fall risk Baseline: 1.0 ft/s w/RW  Goal status: INITIAL  3.  Pt will improve 5 x STS to less than or equal to 16s w/equal BLE weight acceptance seconds to demonstrate improved functional strength and transfer efficiency.  Baseline: 28.62s w/weight through RLE mostly' 19.63s w/equal weight shift (8/6) Goal status: INITIAL  4.  Pt will improve normal TUG to less than or equal to 25 seconds w/LRAD for improved functional mobility and decreased fall risk. Baseline: 37.32s w/RW and SBA Goal status: INITIAL   ASSESSMENT:  CLINICAL IMPRESSION: Emphasis of skilled PT  session on improved step clearance of LLE, transfers, BLE strength and LE coordination. Pt continues to shift weight to RLE primarily w/transfers despite max multimodal cues to shift equally to L. Pt extremely challenged by banded gait, noting visible tremors in LLE by end of 115' due to fatigue. Pt will continue to benefit from skilled PT to improve weight  shift to L side w/weightbearing activity and improve global strength for improved independence. Continue POC.     OBJECTIVE IMPAIRMENTS: Abnormal gait, decreased activity tolerance, decreased balance, decreased endurance, decreased mobility, difficulty walking, and decreased strength  ACTIVITY LIMITATIONS: carrying, lifting, squatting, stairs, transfers, and locomotion level  PARTICIPATION LIMITATIONS: meal prep, cleaning, laundry, medication management, driving, shopping, community activity, and yard work  PERSONAL FACTORS: Age, Fitness, Past/current experiences, and 1 comorbidity: cryptogenic stroke  are also affecting patient's functional outcome.   REHAB POTENTIAL: Fair due to chronicity of CVA and LLE weakness  CLINICAL DECISION MAKING: Stable/uncomplicated  EVALUATION COMPLEXITY: Low  PLAN:  PT FREQUENCY: 1-2x/week  PT DURATION: 6 weeks  PLANNED INTERVENTIONS: Therapeutic exercises, Therapeutic activity, Neuromuscular re-education, Balance training, Gait training, Patient/Family education, Self Care, Joint mobilization, Stair training, Orthotic/Fit training, DME instructions, Aquatic Therapy, Dry Needling, Manual therapy, and Re-evaluation  PLAN FOR NEXT SESSION: Monitor BP (may have to check manually). trial Bioness to L quads. Eccentric control of L quad, sit to stands w/weight shift to LLE. Add to HEP for LLE strength and endurance, continue to work on upright trunk with gait, Blaze pods for reaching outside BOS and across midline   Josephine Igo, PT, DPT [facility] [address] [place], [place]  [address] Phone:  [phone] Fax:  [phone] 10/24/2022, 1:12 PM

## 2022-10-26 NOTE — Progress Notes (Signed)
[facility]link Summary Report / Loop Recorder 

## 2022-10-29 ENCOUNTER — Ambulatory Visit: Payer: Medicare HMO | Admitting: Physical Therapy

## 2022-10-29 DIAGNOSIS — R2681 Unsteadiness on feet: Secondary | ICD-10-CM | POA: Diagnosis not present

## 2022-10-29 DIAGNOSIS — R2689 Other abnormalities of gait and mobility: Secondary | ICD-10-CM

## 2022-10-29 DIAGNOSIS — R262 Difficulty in walking, not elsewhere classified: Secondary | ICD-10-CM | POA: Diagnosis not present

## 2022-10-29 DIAGNOSIS — M6281 Muscle weakness (generalized): Secondary | ICD-10-CM | POA: Diagnosis not present

## 2022-10-29 DIAGNOSIS — R278 Other lack of coordination: Secondary | ICD-10-CM | POA: Diagnosis not present

## 2022-10-29 NOTE — Therapy (Signed)
OUTPATIENT PHYSICAL THERAPY NEURO TREATMENT   Patient Name: Russell Russell MRN: [id number] DOB:09-05-1950, 72 y.o., male Today's Date: 10/29/2022   PCP: Jarold Motto, PA REFERRING PROVIDER: Rodolph Bong, MD  END OF SESSION:  PT End of Session - 10/29/22 1224     Visit Number 8    Number of Visits 13   Plus eval   Date for PT Re-Evaluation 11/12/22   Due to delay in scheduling   Authorization Type [employer]    Progress Note Due on Visit 10    PT Start Time 1223    PT Stop Time 1303    PT Time Calculation (min) 40 min    Equipment Utilized During Treatment Gait belt;Other (comment)   L toe cap   Activity Tolerance Patient tolerated treatment well    Behavior During Therapy WFL for tasks assessed/performed                       Past Medical History:  Diagnosis Date   Alcohol abuse    drinks 1 gallon of Gin every weekend, nothing during the week x >15 yrs   Atrial fibrillation (HCC)    Elevated transaminase level    +elev bili: abd u/s 06/2014 showed stable small hepatic hemangiomas, o/w normal.   Essential hypertension    GERD (gastroesophageal reflux disease)    Hyperlipidemia 03/2014   Atorv started-chol improved   Hypertension    Impaired fasting glucose 03/2014   Nephrolithiasis    PUD (peptic ulcer disease)    Stroke (HCC) 2021   Tobacco dependence    chantix: "psych effects"   Past Surgical History:  Procedure Laterality Date   COLONOSCOPY  2005   Recall 10 yrs ([place])   LOOP RECORDER INSERTION N/A 07/03/2019   Procedure: LOOP RECORDER INSERTION;  Surgeon: Hillis Range, MD;  Location: [facility];  Service: Cardiovascular;  Laterality: N/A;   removal of kidney stones  2006   cystoscopic--removed from ureter.  No prob since.   Patient Active Problem List   Diagnosis Date Noted   Renal structural abnormality 08/24/2021   H/O ETOH abuse 04/28/2020   History of tobacco abuse 01/07/2020   Hemiparesis affecting left side  as late effect of stroke (HCC) 01/07/2020   Abnormality of gait 09/24/2019   AKI (acute kidney injury) (HCC)    Fever    Sepsis without acute organ dysfunction (HCC)    Elevated BUN    Blood pressure increase diastolic    Labile blood pressure    Leukopenia    Benign essential HTN    Hypoalbuminemia    Cerebrovascular accident (CVA) of right basal ganglia (HCC) 07/07/2019   Acute ischemic stroke (HCC)    Dyslipidemia    Dysphagia, post-stroke    Polycythemia    Stroke (HCC) 06/30/2019   Prediabetes 12/23/2016   Elevated LFTs 12/23/2016   Pure hypercholesterolemia 12/21/2016   Alcoholism (HCC) 12/21/2016   Eczema 09/18/2013   Essential hypertension 09/18/2013   Gastroesophageal reflux disease without esophagitis 09/18/2013   Tobacco abuse 09/18/2013    ONSET DATE: 08/31/2022 (referral)  REFERRING DIAG: R29.898 (ICD-10-CM) - Weakness of left lower extremity R26.9 (ICD-10-CM) - Abnormality of gait  THERAPY DIAG:  Muscle weakness (generalized)  Other abnormalities of gait and mobility  Unsteadiness on feet  Rationale for Evaluation and Treatment: Rehabilitation  SUBJECTIVE:  SUBJECTIVE STATEMENT: Pt reports doing well. Left leg was very tired following last session and felt heavy. No falls or pain   Pt accompanied by:  Wife, [name]  PERTINENT HISTORY: cerebral infarction and cryptogenic stroke.  PAIN:  Are you having pain? No  PRECAUTIONS: Fall  WEIGHT BEARING RESTRICTIONS: No  FALLS: Has patient fallen in last 6 months? No  LIVING ENVIRONMENT: Lives with: lives with their spouse Lives in: House/apartment Stairs: Yes: External: 3 steps; on right going up, on left going up, and can reach both Has following equipment at home: Quad cane small base, Walker - 2 wheeled, shower  chair, and L AFO  PLOF: Needs assistance with homemaking  PATIENT GOALS: "Just strengthen this leg"   OBJECTIVE:   DIAGNOSTIC FINDINGS: MRI of brain from 09/06/22  IMPRESSION: No acute intracranial process. No evidence of acute or subacute infarct.  MRI of brain from 06/2019 IMPRESSION: 1. Multiple scattered acute ischemic infarcts involving the posterior right basal ganglia, right cerebral hemisphere, and right cerebellum as above. No associated hemorrhage or mass effect. 2. Underlying atrophy with advanced chronic microvascular ischemic disease, with multiple remote lacunar infarcts about the bilateral basal ganglia, thalami, and pons, with additional chronic left cerebellar infarct.  COGNITION: Overall cognitive status: Within functional limits for tasks assessed   SENSATION: Pt denies numbness/tingling in his legs    POSTURE: rounded shoulders, forward head, and increased thoracic kyphosis  LOWER EXTREMITY MMT:  Tested in seated position   MMT Right Eval Left Eval  Hip flexion 4+ 4  Hip extension    Hip abduction 4+ 4  Hip adduction 4+ 4  Hip internal rotation    Hip external rotation    Knee flexion 5 4+  Knee extension 5 4-  Ankle dorsiflexion 5 4  Ankle plantarflexion    Ankle inversion    Ankle eversion    (Blank rows = not tested)    VITALS  There were no vitals filed for this visit.   TODAY'S TREATMENT:          Ther Ex  SciFit multi-peaks level 9 for 8 minutes using BLEs only for neural priming for reciprocal movement, dynamic cardiovascular warmup and increased amplitude of stepping. RPE of 8/10 following activity   NMR  In // bars, 6 Blaze pods on random reach setting for improved turns, LE coordination, single leg stability and endurance.  Performed on 3 minute intervals with 2 minute rest periods.  Pt requires SBA guarding and BUE support (despite max cues to not use hands). Round 1:  with 3 pods on either side of pt over span of 10'.  24  hits. Round 2:  same setup as above on blue mat and 7# ankle weight on LLE.  19 hits. Round 3:  same setup as above while navigating 2 4" hurdles.  13 hits. Notable errors/deficits:  Mod verbal cues for increased step clearance w/LLE.  RPE of 10/10 following activity   Gait pattern: step through pattern, decreased step length- Left, decreased stride length, decreased hip/knee flexion- Left, decreased ankle dorsiflexion- Left, trunk flexed, and poor foot clearance- Left Distance walked: 150' Assistive device utilized: Environmental consultant - 2 wheeled Level of assistance: SBA Comments: with 7# ankle weight on LLE and red theraband around distal quad to provide tactile cues for wider BOS and increased step clearance of LLE. Mod verbal cues for forward gaze, as pt tends to look down at feet. Pt very challenged by this and was sliding L foot by end of  gait   Attempted bird dogs on mat table, but pt having difficulty stabilizing in quadruped position. Regressed to alt kick backs in quadruped w/mod A for steadying assist. Pt unable to lift LLE back against gravity. Informed pt we will plan to perform floor transfer next session on work on quadruped/tall kneel/half kneel. Pt verbalized understanding.   RPE of 10/10 following session      PATIENT EDUCATION: Education details: Continue HEP, plan for next session  Person educated: Patient and Spouse Education method: Medical illustrator Education comprehension: verbalized understanding, returned demonstration, and needs further education  HOME EXERCISE PROGRAM: Access Code: [email or web address] URL: [email or web address] Date: 10/03/2022 Prepared by: Alethia Berthold Romone Shaff  Exercises - Sit to Stand Without Arm Support  - 1 x daily - 7 x weekly - 3 sets - 10 reps - Mini Squat with Counter Support  - 1 x daily - 7 x weekly - 3 sets - 10 reps - Standing Forward Step Taps with Counter Support  - 1 x daily - 7 x weekly - 3 sets - 10 reps - Modified Thomas  Stretch  - 1 x daily - 7 x weekly - 1 sets - 3-5 reps - 30 sec hold - Side Stepping with Resistance at [employer] and Counter Support  - 1 x daily - 7 x weekly - 3 sets - 10 reps - Forward Backward Monster Walk with Band at [employer] and Counter Support  - 1 x daily - 7 x weekly - 3 sets - 10 reps  GOALS: Goals reviewed with patient? Yes  SHORT TERM GOALS: Target date: 10/15/2022  Pt will be independent with initial HEP for improved strength, balance, transfers and gait. Baseline: not established on eval  Goal status: MET   2.  Pt will improve normal TUG to less than or equal to 30 seconds w/LRAD and SBA for improved functional mobility and decreased fall risk. Baseline: 37.32s w/RW and SBA; 34.65s w/RW and SBA Goal status: NOT MET  3.  Pt will improve 5 x STS to less than or equal to 25 seconds w/equal weightbearing through BLEs to demonstrate improved functional strength and transfer efficiency.  Baseline: 28.62s w/weight through RLE mostly; 19.63s w/equal weightshift  Goal status: MET  4.  Pt will improve gait velocity to at least 1.4 ft/s w/LRAD for improved gait efficiency  Baseline: 1.0 ft/s w/RW; 1.07 ft/s w/RW  Goal status: NOT MET   5.  Pt will trial various braces in clinic to determine safest and most efficient option for pt to improve independence w/gait.  Baseline: Has AFO- is to bring to first session  Goal status: MET  LONG TERM GOALS: Target date: 11/12/2022 (updated to match cert date)    Pt will be independent with final HEP for improved strength, balance, transfers and gait. Baseline:  Goal status: INITIAL  2.  Pt will improve gait velocity to at least 1.9 ft/s w/LRAD for improved gait efficiency and reduced fall risk Baseline: 1.0 ft/s w/RW  Goal status: INITIAL  3.  Pt will improve 5 x STS to less than or equal to 16s w/equal BLE weight acceptance seconds to demonstrate improved functional strength and transfer efficiency.  Baseline: 28.62s w/weight through RLE  mostly' 19.63s w/equal weight shift (8/6) Goal status: INITIAL  4.  Pt will improve normal TUG to less than or equal to 25 seconds w/LRAD for improved functional mobility and decreased fall risk. Baseline: 37.32s w/RW and SBA Goal status: INITIAL   ASSESSMENT:  CLINICAL IMPRESSION: Emphasis of  skilled PT session on increased step clearance/length of LLE, endurance and single leg stability. Pt continues to require mod verbal cues for increased step clearance of LLE w/gait as he relies heavily on toe cap and sliding foot on ground during activity. Pt has good response to use of tactile cues (ankle weight) for increased step clearance of LLE but poor carryover when tactile cues removed. Continue POC.     OBJECTIVE IMPAIRMENTS: Abnormal gait, decreased activity tolerance, decreased balance, decreased endurance, decreased mobility, difficulty walking, and decreased strength  ACTIVITY LIMITATIONS: carrying, lifting, squatting, stairs, transfers, and locomotion level  PARTICIPATION LIMITATIONS: meal prep, cleaning, laundry, medication management, driving, shopping, community activity, and yard work  PERSONAL FACTORS: Age, Fitness, Past/current experiences, and 1 comorbidity: cryptogenic stroke  are also affecting patient's functional outcome.   REHAB POTENTIAL: Fair due to chronicity of CVA and LLE weakness  CLINICAL DECISION MAKING: Stable/uncomplicated  EVALUATION COMPLEXITY: Low  PLAN:  PT FREQUENCY: 1-2x/week  PT DURATION: 6 weeks  PLANNED INTERVENTIONS: Therapeutic exercises, Therapeutic activity, Neuromuscular re-education, Balance training, Gait training, Patient/Family education, Self Care, Joint mobilization, Stair training, Orthotic/Fit training, DME instructions, Aquatic Therapy, Dry Needling, Manual therapy, and Re-evaluation  PLAN FOR NEXT SESSION: Monitor BP (may have to check manually). Floor transfers (bird dogs, tall to half kneel, mini squats, tall kneel ball tosses)  trial Bioness to L quads. Eccentric control of L quad, sit to stands w/weight shift to LLE. Add to HEP for LLE strength and endurance, continue to work on upright trunk with gait, Blaze pods for reaching outside BOS and across midline   Josephine Igo, PT, DPT [facility] [address] [place], [place]  [address] Phone:  [phone] Fax:  [phone] 10/29/2022, 1:03 PM

## 2022-10-31 ENCOUNTER — Ambulatory Visit: Payer: Medicare HMO | Admitting: Physical Therapy

## 2022-10-31 DIAGNOSIS — R278 Other lack of coordination: Secondary | ICD-10-CM

## 2022-10-31 DIAGNOSIS — M6281 Muscle weakness (generalized): Secondary | ICD-10-CM | POA: Diagnosis not present

## 2022-10-31 DIAGNOSIS — R2681 Unsteadiness on feet: Secondary | ICD-10-CM

## 2022-10-31 DIAGNOSIS — R2689 Other abnormalities of gait and mobility: Secondary | ICD-10-CM | POA: Diagnosis not present

## 2022-10-31 DIAGNOSIS — R262 Difficulty in walking, not elsewhere classified: Secondary | ICD-10-CM | POA: Diagnosis not present

## 2022-10-31 NOTE — Therapy (Signed)
OUTPATIENT PHYSICAL THERAPY NEURO TREATMENT   Patient Name: Russell Russell MRN: [id number] DOB:10/30/1950, 72 y.o., male Today's Date: 10/31/2022   PCP: Jarold Motto, PA REFERRING PROVIDER: Rodolph Bong, MD  END OF SESSION:  PT End of Session - 10/31/22 1233     Visit Number 9    Number of Visits 13   Plus eval   Date for PT Re-Evaluation 11/12/22   Due to delay in scheduling   Authorization Type [employer]    Progress Note Due on Visit 10    PT Start Time 1231    PT Stop Time 1315    PT Time Calculation (min) 44 min    Equipment Utilized During Treatment Gait belt;Other (comment)   L toe cap   Activity Tolerance Patient tolerated treatment well;Patient limited by fatigue    Behavior During Therapy WFL for tasks assessed/performed                        Past Medical History:  Diagnosis Date   Alcohol abuse    drinks 1 gallon of Gin every weekend, nothing during the week x >15 yrs   Atrial fibrillation (HCC)    Elevated transaminase level    +elev bili: abd u/s 06/2014 showed stable small hepatic hemangiomas, o/w normal.   Essential hypertension    GERD (gastroesophageal reflux disease)    Hyperlipidemia 03/2014   Atorv started-chol improved   Hypertension    Impaired fasting glucose 03/2014   Nephrolithiasis    PUD (peptic ulcer disease)    Stroke (HCC) 2021   Tobacco dependence    chantix: "psych effects"   Past Surgical History:  Procedure Laterality Date   COLONOSCOPY  2005   Recall 10 yrs ([place])   LOOP RECORDER INSERTION N/A 07/03/2019   Procedure: LOOP RECORDER INSERTION;  Surgeon: Hillis Range, MD;  Location: [facility];  Service: Cardiovascular;  Laterality: N/A;   removal of kidney stones  2006   cystoscopic--removed from ureter.  No prob since.   Patient Active Problem List   Diagnosis Date Noted   Renal structural abnormality 08/24/2021   H/O ETOH abuse 04/28/2020   History of tobacco abuse 01/07/2020    Hemiparesis affecting left side as late effect of stroke (HCC) 01/07/2020   Abnormality of gait 09/24/2019   AKI (acute kidney injury) (HCC)    Fever    Sepsis without acute organ dysfunction (HCC)    Elevated BUN    Blood pressure increase diastolic    Labile blood pressure    Leukopenia    Benign essential HTN    Hypoalbuminemia    Cerebrovascular accident (CVA) of right basal ganglia (HCC) 07/07/2019   Acute ischemic stroke (HCC)    Dyslipidemia    Dysphagia, post-stroke    Polycythemia    Stroke (HCC) 06/30/2019   Prediabetes 12/23/2016   Elevated LFTs 12/23/2016   Pure hypercholesterolemia 12/21/2016   Alcoholism (HCC) 12/21/2016   Eczema 09/18/2013   Essential hypertension 09/18/2013   Gastroesophageal reflux disease without esophagitis 09/18/2013   Tobacco abuse 09/18/2013    ONSET DATE: 08/31/2022 (referral)  REFERRING DIAG: R29.898 (ICD-10-CM) - Weakness of left lower extremity R26.9 (ICD-10-CM) - Abnormality of gait  THERAPY DIAG:  Muscle weakness (generalized)  Other abnormalities of gait and mobility  Unsteadiness on feet  Other lack of coordination  Rationale for Evaluation and Treatment: Rehabilitation  SUBJECTIVE:  SUBJECTIVE STATEMENT: Pt reports doing well. Left leg was very tired following last session and felt heavy. No falls or pain   Pt accompanied by:  Wife, [name]  PERTINENT HISTORY: cerebral infarction and cryptogenic stroke.  PAIN:  Are you having pain? No  PRECAUTIONS: Fall  WEIGHT BEARING RESTRICTIONS: No  FALLS: Has patient fallen in last 6 months? No  LIVING ENVIRONMENT: Lives with: lives with their spouse Lives in: House/apartment Stairs: Yes: External: 3 steps; on right going up, on left going up, and can reach both Has following equipment  at home: Quad cane small base, Walker - 2 wheeled, shower chair, and L AFO  PLOF: Needs assistance with homemaking  PATIENT GOALS: "Just strengthen this leg"   OBJECTIVE:   DIAGNOSTIC FINDINGS: MRI of brain from 09/06/22  IMPRESSION: No acute intracranial process. No evidence of acute or subacute infarct.  MRI of brain from 06/2019 IMPRESSION: 1. Multiple scattered acute ischemic infarcts involving the posterior right basal ganglia, right cerebral hemisphere, and right cerebellum as above. No associated hemorrhage or mass effect. 2. Underlying atrophy with advanced chronic microvascular ischemic disease, with multiple remote lacunar infarcts about the bilateral basal ganglia, thalami, and pons, with additional chronic left cerebellar infarct.  COGNITION: Overall cognitive status: Within functional limits for tasks assessed   SENSATION: Pt denies numbness/tingling in his legs    POSTURE: rounded shoulders, forward head, and increased thoracic kyphosis  LOWER EXTREMITY MMT:  Tested in seated position   MMT Right Eval Left Eval  Hip flexion 4+ 4  Hip extension    Hip abduction 4+ 4  Hip adduction 4+ 4  Hip internal rotation    Hip external rotation    Knee flexion 5 4+  Knee extension 5 4-  Ankle dorsiflexion 5 4  Ankle plantarflexion    Ankle inversion    Ankle eversion    (Blank rows = not tested)    VITALS  There were no vitals filed for this visit.   TODAY'S TREATMENT:          NMR  Floor Recovery: Patient educated in floor recovery this visit using teach-back for injury assessment and sequencing of task in clinic setting.  Discussion of transfer of skills to variable scenarios outside the clinic.  Patient has most difficulty with proper positioning of BLEs and pushing up to stand.  Performed 3 times throughout session. Caregiver Training:  Caregiver present: assisted on second rep .  Level of Assist:  Verbal/tactile cues, SBA, and MinA. Pt regressed to  needing min A as he fatigued.  On floor mat:  Tall to half kneel transition w/RUE support on mat table, x8 reps per side. Increased difficulty clearing LLE > RLE and pt unable to facilitate full trunk extension when RLE forward due to weakness of L hip Mini squats without UE support, x15 reps. Min A to assist w/full hip extension and upright posture, as pt wobbly when in full tall kneel position.  In tall kneel (LLE fwd), D2 pattern w/6# med ball x8 reps w/min-mod A for steadying assist. Attempted w/RLE forward, but due to fatigue and LLE weakness, pt unable to attain position without max A. Assisted pt back to mat table w/min A and provided seated rest and water. RPE of 10/10  Pt performed floor transfer back to floor mat and attempted to hold tall kneel position w/RLE, 2x10s holds w/mod A for stability. Pt very fatigued and unable to hold position this date, so performed final transfer to mat table w/min  A. RPE of 8/10   6 Blaze pods on random setting for improved UE coordination, core stability and lateral weight shifting.  Performed on 2 minute intervals with 3 minute rest periods.  Pt requires CGA-min A guarding. Round 1:  in quadruped on mat w/3 pods placed in parallel on either side of pt.  75 hits. Round 2:  same setup as above w/knees propped on blue dynadiscs.  37 hits. Notable errors/deficits:  decreased stability on LUE and decreased weight shift to LLE   PATIENT EDUCATION: Education details: Continue HEP Person educated: Patient and Spouse Education method: Explanation Education comprehension: verbalized understanding  HOME EXERCISE PROGRAM: Access Code: [email or web address] URL: [email or web address] Date: 10/03/2022 Prepared by: Alethia Berthold Duaa Stelzner  Exercises - Sit to Stand Without Arm Support  - 1 x daily - 7 x weekly - 3 sets - 10 reps - Mini Squat with Counter Support  - 1 x daily - 7 x weekly - 3 sets - 10 reps - Standing Forward Step Taps with Counter Support  - 1 x daily  - 7 x weekly - 3 sets - 10 reps - Modified Thomas Stretch  - 1 x daily - 7 x weekly - 1 sets - 3-5 reps - 30 sec hold - Side Stepping with Resistance at [employer] and Counter Support  - 1 x daily - 7 x weekly - 3 sets - 10 reps - Forward Backward Monster Walk with Band at [employer] and Counter Support  - 1 x daily - 7 x weekly - 3 sets - 10 reps  GOALS: Goals reviewed with patient? Yes  SHORT TERM GOALS: Target date: 10/15/2022  Pt will be independent with initial HEP for improved strength, balance, transfers and gait. Baseline: not established on eval  Goal status: MET   2.  Pt will improve normal TUG to less than or equal to 30 seconds w/LRAD and SBA for improved functional mobility and decreased fall risk. Baseline: 37.32s w/RW and SBA; 34.65s w/RW and SBA Goal status: NOT MET  3.  Pt will improve 5 x STS to less than or equal to 25 seconds w/equal weightbearing through BLEs to demonstrate improved functional strength and transfer efficiency.  Baseline: 28.62s w/weight through RLE mostly; 19.63s w/equal weightshift  Goal status: MET  4.  Pt will improve gait velocity to at least 1.4 ft/s w/LRAD for improved gait efficiency  Baseline: 1.0 ft/s w/RW; 1.07 ft/s w/RW  Goal status: NOT MET   5.  Pt will trial various braces in clinic to determine safest and most efficient option for pt to improve independence w/gait.  Baseline: Has AFO- is to bring to first session  Goal status: MET  LONG TERM GOALS: Target date: 11/12/2022 (updated to match cert date)    Pt will be independent with final HEP for improved strength, balance, transfers and gait. Baseline:  Goal status: INITIAL  2.  Pt will improve gait velocity to at least 1.9 ft/s w/LRAD for improved gait efficiency and reduced fall risk Baseline: 1.0 ft/s w/RW  Goal status: INITIAL  3.  Pt will improve 5 x STS to less than or equal to 16s w/equal BLE weight acceptance seconds to demonstrate improved functional strength and transfer  efficiency.  Baseline: 28.62s w/weight through RLE mostly' 19.63s w/equal weight shift (8/6) Goal status: INITIAL  4.  Pt will improve normal TUG to less than or equal to 25 seconds w/LRAD for improved functional mobility and decreased fall risk. Baseline: 37.32s w/RW and SBA Goal status: INITIAL  ASSESSMENT:  CLINICAL IMPRESSION: Emphasis of skilled PT session on floor transfers, functional stability on LLE and global strength. Pt very challenged by session, requiring min-mod A for stability, especially when in tall kneel on LLE. Pt would benefit from continued practice in transitional positions (quadruped, tall kneel and half kneel) to improve functional stability of LLE. Continue POC.     OBJECTIVE IMPAIRMENTS: Abnormal gait, decreased activity tolerance, decreased balance, decreased endurance, decreased mobility, difficulty walking, and decreased strength  ACTIVITY LIMITATIONS: carrying, lifting, squatting, stairs, transfers, and locomotion level  PARTICIPATION LIMITATIONS: meal prep, cleaning, laundry, medication management, driving, shopping, community activity, and yard work  PERSONAL FACTORS: Age, Fitness, Past/current experiences, and 1 comorbidity: cryptogenic stroke  are also affecting patient's functional outcome.   REHAB POTENTIAL: Fair due to chronicity of CVA and LLE weakness  CLINICAL DECISION MAKING: Stable/uncomplicated  EVALUATION COMPLEXITY: Low  PLAN:  PT FREQUENCY: 1-2x/week  PT DURATION: 6 weeks  PLANNED INTERVENTIONS: Therapeutic exercises, Therapeutic activity, Neuromuscular re-education, Balance training, Gait training, Patient/Family education, Self Care, Joint mobilization, Stair training, Orthotic/Fit training, DME instructions, Aquatic Therapy, Dry Needling, Manual therapy, and Re-evaluation  PLAN FOR NEXT SESSION: 10th visit PN. Monitor BP (may have to check manually). Floor transfers (bird dogs, tall to half kneel, mini squats, tall kneel ball  tosses) trial Bioness to L quads. Eccentric control of L quad, sit to stands w/weight shift to LLE. Add to HEP for LLE strength and endurance, continue to work on upright trunk with gait, Blaze pods for reaching outside BOS and across midline. Tall > half kneel    Jill Alexanders Manveer Gomes, PT, DPT [facility] [address] [place], [place]  [address] Phone:  [phone] Fax:  [phone] 10/31/2022, 1:21 PM

## 2022-11-05 ENCOUNTER — Ambulatory Visit: Payer: Medicare HMO

## 2022-11-05 VITALS — BP 146/114

## 2022-11-05 DIAGNOSIS — R2689 Other abnormalities of gait and mobility: Secondary | ICD-10-CM

## 2022-11-05 DIAGNOSIS — R2681 Unsteadiness on feet: Secondary | ICD-10-CM

## 2022-11-05 DIAGNOSIS — R262 Difficulty in walking, not elsewhere classified: Secondary | ICD-10-CM

## 2022-11-05 DIAGNOSIS — M6281 Muscle weakness (generalized): Secondary | ICD-10-CM

## 2022-11-05 DIAGNOSIS — R278 Other lack of coordination: Secondary | ICD-10-CM

## 2022-11-05 NOTE — Therapy (Signed)
OUTPATIENT PHYSICAL THERAPY NEURO TREATMENT- ARRIVED NO CHARGE   Patient Name: Russell Russell MRN: [id number] DOB:13-Aug-1950, 72 y.o., male Today's Date: 11/05/2022   PCP: Jarold Motto, PA REFERRING PROVIDER: Rodolph Bong, MD   END OF SESSION:  PT End of Session - 11/05/22 1142     Visit Number 9    Number of Visits 13    Date for PT Re-Evaluation 11/12/22    Authorization Type [employer]    Progress Note Due on Visit 20    PT Start Time 1145    PT Stop Time 1200   arrived no charge   PT Time Calculation (min) 15 min    Activity Tolerance Treatment limited secondary to medical complications (Comment)   high BP   Behavior During Therapy WFL for tasks assessed/performed              Past Medical History:  Diagnosis Date   Alcohol abuse    drinks 1 gallon of Gin every weekend, nothing during the week x >15 yrs   Atrial fibrillation (HCC)    Elevated transaminase level    +elev bili: abd u/s 06/2014 showed stable small hepatic hemangiomas, o/w normal.   Essential hypertension    GERD (gastroesophageal reflux disease)    Hyperlipidemia 03/2014   Atorv started-chol improved   Hypertension    Impaired fasting glucose 03/2014   Nephrolithiasis    PUD (peptic ulcer disease)    Stroke (HCC) 2021   Tobacco dependence    chantix: "psych effects"   Past Surgical History:  Procedure Laterality Date   COLONOSCOPY  2005   Recall 10 yrs ([place])   LOOP RECORDER INSERTION N/A 07/03/2019   Procedure: LOOP RECORDER INSERTION;  Surgeon: Hillis Range, MD;  Location: [facility];  Service: Cardiovascular;  Laterality: N/A;   removal of kidney stones  2006   cystoscopic--removed from ureter.  No prob since.   Patient Active Problem List   Diagnosis Date Noted   Renal structural abnormality 08/24/2021   H/O ETOH abuse 04/28/2020   History of tobacco abuse 01/07/2020   Hemiparesis affecting left side as late effect of stroke (HCC) 01/07/2020   Abnormality  of gait 09/24/2019   AKI (acute kidney injury) (HCC)    Fever    Sepsis without acute organ dysfunction (HCC)    Elevated BUN    Blood pressure increase diastolic    Labile blood pressure    Leukopenia    Benign essential HTN    Hypoalbuminemia    Cerebrovascular accident (CVA) of right basal ganglia (HCC) 07/07/2019   Acute ischemic stroke (HCC)    Dyslipidemia    Dysphagia, post-stroke    Polycythemia    Stroke (HCC) 06/30/2019   Prediabetes 12/23/2016   Elevated LFTs 12/23/2016   Pure hypercholesterolemia 12/21/2016   Alcoholism (HCC) 12/21/2016   Eczema 09/18/2013   Essential hypertension 09/18/2013   Gastroesophageal reflux disease without esophagitis 09/18/2013   Tobacco abuse 09/18/2013    ONSET DATE: 08/31/2022 (referral)  REFERRING DIAG: R29.898 (ICD-10-CM) - Weakness of left lower extremity R26.9 (ICD-10-CM) - Abnormality of gait  THERAPY DIAG:  Muscle weakness (generalized)  Other abnormalities of gait and mobility  Unsteadiness on feet  Other lack of coordination  Difficulty in walking, not elsewhere classified  Rationale for Evaluation and Treatment: Rehabilitation  SUBJECTIVE:  SUBJECTIVE STATEMENT: Patient arrives to clinic with wife. Reports doing well. BP was good at home this AM (~120/90). Denies falls. Wife does report that patient smoked 2 cigarettes prior to session  Pt accompanied by:  Wife, Russell Russell  PERTINENT HISTORY: cerebral infarction and cryptogenic stroke.  PAIN:  Are you having pain? No  PRECAUTIONS: Fall  WEIGHT BEARING RESTRICTIONS: No  FALLS: Has patient fallen in last 6 months? No  LIVING ENVIRONMENT: Lives with: lives with their spouse Lives in: House/apartment Stairs: Yes: External: 3 steps; on right going up, on left going up, and can  reach both Has following equipment at home: Quad cane small base, Walker - 2 wheeled, shower chair, and L AFO  PLOF: Needs assistance with homemaking  PATIENT GOALS: "Just strengthen this leg"   OBJECTIVE:   DIAGNOSTIC FINDINGS: MRI of brain from 09/06/22  IMPRESSION: No acute intracranial process. No evidence of acute or subacute infarct.  MRI of brain from 06/2019 IMPRESSION: 1. Multiple scattered acute ischemic infarcts involving the posterior right basal ganglia, right cerebral hemisphere, and right cerebellum as above. No associated hemorrhage or mass effect. 2. Underlying atrophy with advanced chronic microvascular ischemic disease, with multiple remote lacunar infarcts about the bilateral basal ganglia, thalami, and pons, with additional chronic left cerebellar infarct.  COGNITION: Overall cognitive status: Within functional limits for tasks assessed   SENSATION: Pt denies numbness/tingling in his legs    POSTURE: rounded shoulders, forward head, and increased thoracic kyphosis  LOWER EXTREMITY MMT:  Tested in seated position   MMT Right Eval Left Eval  Hip flexion 4+ 4  Hip extension    Hip abduction 4+ 4  Hip adduction 4+ 4  Hip internal rotation    Hip external rotation    Knee flexion 5 4+  Knee extension 5 4-  Ankle dorsiflexion 5 4  Ankle plantarflexion    Ankle inversion    Ankle eversion    (Blank rows = not tested)    VITALS  Vitals:   11/05/22 1156 11/05/22 1157  BP: (!) 146/113 (!) 146/114     TODAY'S TREATMENT:         Arrived no charge   PATIENT EDUCATION: Education details: Continue HEP, safe BP parameters Person educated: Patient and Spouse Education method: Explanation Education comprehension: verbalized understanding  HOME EXERCISE PROGRAM: Access Code: [email or web address] URL: [email or web address] Date: 10/03/2022 Prepared by: Alethia Berthold Plaster  Exercises - Sit to Stand Without Arm Support  - 1 x daily - 7 x weekly  - 3 sets - 10 reps - Mini Squat with Counter Support  - 1 x daily - 7 x weekly - 3 sets - 10 reps - Standing Forward Step Taps with Counter Support  - 1 x daily - 7 x weekly - 3 sets - 10 reps - Modified Thomas Stretch  - 1 x daily - 7 x weekly - 1 sets - 3-5 reps - 30 sec hold - Side Stepping with Resistance at [employer] and Counter Support  - 1 x daily - 7 x weekly - 3 sets - 10 reps - Forward Backward Monster Walk with Band at [employer] and Counter Support  - 1 x daily - 7 x weekly - 3 sets - 10 reps  GOALS: Goals reviewed with patient? Yes  SHORT TERM GOALS: Target date: 10/15/2022  Pt will be independent with initial HEP for improved strength, balance, transfers and gait. Baseline: not established on eval  Goal status: MET   2.  Pt will improve normal TUG  to less than or equal to 30 seconds w/LRAD and SBA for improved functional mobility and decreased fall risk. Baseline: 37.32s w/RW and SBA; 34.65s w/RW and SBA Goal status: NOT MET  3.  Pt will improve 5 x STS to less than or equal to 25 seconds w/equal weightbearing through BLEs to demonstrate improved functional strength and transfer efficiency.  Baseline: 28.62s w/weight through RLE mostly; 19.63s w/equal weightshift  Goal status: MET  4.  Pt will improve gait velocity to at least 1.4 ft/s w/LRAD for improved gait efficiency  Baseline: 1.0 ft/s w/RW; 1.07 ft/s w/RW  Goal status: NOT MET   5.  Pt will trial various braces in clinic to determine safest and most efficient option for pt to improve independence w/gait.  Baseline: Has AFO- is to bring to first session  Goal status: MET  LONG TERM GOALS: Target date: 11/12/2022 (updated to match cert date)    Pt will be independent with final HEP for improved strength, balance, transfers and gait. Baseline:  Goal status: INITIAL  2.  Pt will improve gait velocity to at least 1.9 ft/s w/LRAD for improved gait efficiency and reduced fall risk Baseline: 1.0 ft/s w/RW  Goal status:  INITIAL  3.  Pt will improve 5 x STS to less than or equal to 16s w/equal BLE weight acceptance seconds to demonstrate improved functional strength and transfer efficiency.  Baseline: 28.62s w/weight through RLE mostly' 19.63s w/equal weight shift (8/6) Goal status: INITIAL  4.  Pt will improve normal TUG to less than or equal to 25 seconds w/LRAD for improved functional mobility and decreased fall risk. Baseline: 37.32s w/RW and SBA Goal status: INITIAL   ASSESSMENT:  CLINICAL IMPRESSION: Arrived no charge.   OBJECTIVE IMPAIRMENTS: Abnormal gait, decreased activity tolerance, decreased balance, decreased endurance, decreased mobility, difficulty walking, and decreased strength  ACTIVITY LIMITATIONS: carrying, lifting, squatting, stairs, transfers, and locomotion level  PARTICIPATION LIMITATIONS: meal prep, cleaning, laundry, medication management, driving, shopping, community activity, and yard work  PERSONAL FACTORS: Age, Fitness, Past/current experiences, and 1 comorbidity: cryptogenic stroke  are also affecting patient's functional outcome.   REHAB POTENTIAL: Fair due to chronicity of CVA and LLE weakness  CLINICAL DECISION MAKING: Stable/uncomplicated  EVALUATION COMPLEXITY: Low  PLAN:  PT FREQUENCY: 1-2x/week  PT DURATION: 6 weeks  PLANNED INTERVENTIONS: Therapeutic exercises, Therapeutic activity, Neuromuscular re-education, Balance training, Gait training, Patient/Family education, Self Care, Joint mobilization, Stair training, Orthotic/Fit training, DME instructions, Aquatic Therapy, Dry Needling, Manual therapy, and Re-evaluation  PLAN FOR NEXT SESSION: 10th visit PN. Monitor BP (may have to check manually). Floor transfers (bird dogs, tall to half kneel, mini squats, tall kneel ball tosses) trial Bioness to L quads. Eccentric control of L quad, sit to stands w/weight shift to LLE. Add to HEP for LLE strength and endurance, continue to work on upright trunk with gait,  Blaze pods for reaching outside BOS and across midline. Tall > half kneel    Westley Foots, PT, DPT, CBIS 11/05/2022, 2:47 PM

## 2022-11-07 ENCOUNTER — Ambulatory Visit: Payer: Medicare HMO | Admitting: Physical Therapy

## 2022-11-07 ENCOUNTER — Telehealth: Payer: Self-pay | Admitting: Cardiovascular Disease

## 2022-11-07 DIAGNOSIS — R278 Other lack of coordination: Secondary | ICD-10-CM | POA: Diagnosis not present

## 2022-11-07 DIAGNOSIS — R2681 Unsteadiness on feet: Secondary | ICD-10-CM

## 2022-11-07 DIAGNOSIS — R2689 Other abnormalities of gait and mobility: Secondary | ICD-10-CM | POA: Diagnosis not present

## 2022-11-07 DIAGNOSIS — R262 Difficulty in walking, not elsewhere classified: Secondary | ICD-10-CM | POA: Diagnosis not present

## 2022-11-07 DIAGNOSIS — M6281 Muscle weakness (generalized): Secondary | ICD-10-CM

## 2022-11-07 NOTE — Therapy (Signed)
OUTPATIENT PHYSICAL THERAPY NEURO TREATMENT- DISCHARGE SUMMARY AND 10TH VISIT PROGRESS NOTE   Patient Name: Russell Russell MRN: [id number] DOB:November 27, 1950, 72 y.o., male Today's Date: 11/07/2022   PCP: Jarold Motto, PA REFERRING PROVIDER: Rodolph Bong, MD  PHYSICAL THERAPY DISCHARGE SUMMARY  Visits from Start of Care: 10  Current functional level related to goals / functional outcomes: Mod I w/use of RW and L toe cap    Remaining deficits: L hemiparesis, high fall risk    Education / Equipment: HEP   Patient agrees to discharge. Patient goals were partially met. Patient is being discharged due to being pleased with the current functional level.    END OF SESSION:  PT End of Session - 11/07/22 1235     Visit Number 10    Number of Visits 13    Date for PT Re-Evaluation 11/12/22    Authorization Type [employer]    Progress Note Due on Visit 20    PT Start Time 1230    PT Stop Time 1250   DC   PT Time Calculation (min) 20 min    Activity Tolerance Patient tolerated treatment well    Behavior During Therapy WFL for tasks assessed/performed               Past Medical History:  Diagnosis Date   Alcohol abuse    drinks 1 gallon of Gin every weekend, nothing during the week x >15 yrs   Atrial fibrillation (HCC)    Elevated transaminase level    +elev bili: abd u/s 06/2014 showed stable small hepatic hemangiomas, o/w normal.   Essential hypertension    GERD (gastroesophageal reflux disease)    Hyperlipidemia 03/2014   Atorv started-chol improved   Hypertension    Impaired fasting glucose 03/2014   Nephrolithiasis    PUD (peptic ulcer disease)    Stroke (HCC) 2021   Tobacco dependence    chantix: "psych effects"   Past Surgical History:  Procedure Laterality Date   COLONOSCOPY  2005   Recall 10 yrs ([place])   LOOP RECORDER INSERTION N/A 07/03/2019   Procedure: LOOP RECORDER INSERTION;  Surgeon: Hillis Range, MD;  Location: [facility];  Service: Cardiovascular;  Laterality: N/A;   removal of kidney stones  2006   cystoscopic--removed from ureter.  No prob since.   Patient Active Problem List   Diagnosis Date Noted   Renal structural abnormality 08/24/2021   H/O ETOH abuse 04/28/2020   History of tobacco abuse 01/07/2020   Hemiparesis affecting left side as late effect of stroke (HCC) 01/07/2020   Abnormality of gait 09/24/2019   AKI (acute kidney injury) (HCC)    Fever    Sepsis without acute organ dysfunction (HCC)    Elevated BUN    Blood pressure increase diastolic    Labile blood pressure    Leukopenia    Benign essential HTN    Hypoalbuminemia    Cerebrovascular accident (CVA) of right basal ganglia (HCC) 07/07/2019   Acute ischemic stroke (HCC)    Dyslipidemia    Dysphagia, post-stroke    Polycythemia    Stroke (HCC) 06/30/2019   Prediabetes 12/23/2016   Elevated LFTs 12/23/2016   Pure hypercholesterolemia 12/21/2016   Alcoholism (HCC) 12/21/2016   Eczema 09/18/2013   Essential hypertension 09/18/2013   Gastroesophageal reflux disease without esophagitis 09/18/2013   Tobacco abuse 09/18/2013    ONSET DATE: 08/31/2022 (referral)  REFERRING DIAG: R29.898 (ICD-10-CM) - Weakness of left lower extremity R26.9 (ICD-10-CM) -  Abnormality of gait  THERAPY DIAG:  Muscle weakness (generalized)  Other abnormalities of gait and mobility  Unsteadiness on feet  Rationale for Evaluation and Treatment: Rehabilitation  SUBJECTIVE:                                                                                                                                                                                             SUBJECTIVE STATEMENT: Patient arrives to clinic with wife. Reports doing well. BP was good this morning and he did not smoke prior to session. No falls.   Pt accompanied by:  Wife, [name]  PERTINENT HISTORY: cerebral infarction and cryptogenic stroke.  PAIN:  Are you having pain?  No  PRECAUTIONS: Fall  WEIGHT BEARING RESTRICTIONS: No  FALLS: Has patient fallen in last 6 months? No  LIVING ENVIRONMENT: Lives with: lives with their spouse Lives in: House/apartment Stairs: Yes: External: 3 steps; on right going up, on left going up, and can reach both Has following equipment at home: Quad cane small base, Walker - 2 wheeled, shower chair, and L AFO  PLOF: Needs assistance with homemaking  PATIENT GOALS: "Just strengthen this leg"   OBJECTIVE:   DIAGNOSTIC FINDINGS: MRI of brain from 09/06/22  IMPRESSION: No acute intracranial process. No evidence of acute or subacute infarct.  MRI of brain from 06/2019 IMPRESSION: 1. Multiple scattered acute ischemic infarcts involving the posterior right basal ganglia, right cerebral hemisphere, and right cerebellum as above. No associated hemorrhage or mass effect. 2. Underlying atrophy with advanced chronic microvascular ischemic disease, with multiple remote lacunar infarcts about the bilateral basal ganglia, thalami, and pons, with additional chronic left cerebellar infarct.  COGNITION: Overall cognitive status: Within functional limits for tasks assessed   SENSATION: Pt denies numbness/tingling in his legs    POSTURE: rounded shoulders, forward head, and increased thoracic kyphosis  LOWER EXTREMITY MMT:  Tested in seated position   MMT Right Eval Left Eval  Hip flexion 4+ 4  Hip extension    Hip abduction 4+ 4  Hip adduction 4+ 4  Hip internal rotation    Hip external rotation    Knee flexion 5 4+  Knee extension 5 4-  Ankle dorsiflexion 5 4  Ankle plantarflexion    Ankle inversion    Ankle eversion    (Blank rows = not tested)    VITALS  There were no vitals filed for this visit.    TODAY'S TREATMENT:         Ther Act LTG assessment    [facility] PT Assessment - 11/07/22 1240       Transfers   Five time sit  to stand comments  17.38s   no UE support, equal weight shift      Ambulation/Gait   Gait velocity 32.8' over 27.59s = 1.18 ft/s w/RW      Timed Up and Go Test   Normal TUG (seconds) 27.04   w/RW           Discussed goal outcomes and importance of continuing monitoring BP, walking and performing HEP to prevent relapse in mobility. Pt verbalized understanding.    PATIENT EDUCATION: Education details: Goal outcomes, continue HEP  Person educated: Patient and Spouse Education method: Explanation Education comprehension: verbalized understanding  HOME EXERCISE PROGRAM: Access Code: [email or web address] URL: [email or web address] Date: 10/03/2022 Prepared by: Alethia Berthold Kadan Millstein  Exercises - Sit to Stand Without Arm Support  - 1 x daily - 7 x weekly - 3 sets - 10 reps - Mini Squat with Counter Support  - 1 x daily - 7 x weekly - 3 sets - 10 reps - Standing Forward Step Taps with Counter Support  - 1 x daily - 7 x weekly - 3 sets - 10 reps - Modified Thomas Stretch  - 1 x daily - 7 x weekly - 1 sets - 3-5 reps - 30 sec hold - Side Stepping with Resistance at [employer] and Counter Support  - 1 x daily - 7 x weekly - 3 sets - 10 reps - Forward Backward Monster Walk with Band at [employer] and Counter Support  - 1 x daily - 7 x weekly - 3 sets - 10 reps  GOALS: Goals reviewed with patient? Yes  SHORT TERM GOALS: Target date: 10/15/2022  Pt will be independent with initial HEP for improved strength, balance, transfers and gait. Baseline: not established on eval  Goal status: MET   2.  Pt will improve normal TUG to less than or equal to 30 seconds w/LRAD and SBA for improved functional mobility and decreased fall risk. Baseline: 37.32s w/RW and SBA; 34.65s w/RW and SBA Goal status: NOT MET  3.  Pt will improve 5 x STS to less than or equal to 25 seconds w/equal weightbearing through BLEs to demonstrate improved functional strength and transfer efficiency.  Baseline: 28.62s w/weight through RLE mostly; 19.63s w/equal weightshift  Goal status: MET  4.  Pt  will improve gait velocity to at least 1.4 ft/s w/LRAD for improved gait efficiency  Baseline: 1.0 ft/s w/RW; 1.07 ft/s w/RW  Goal status: NOT MET   5.  Pt will trial various braces in clinic to determine safest and most efficient option for pt to improve independence w/gait.  Baseline: Has AFO- is to bring to first session  Goal status: MET  LONG TERM GOALS: Target date: 11/12/2022 (updated to match cert date)    Pt will be independent with final HEP for improved strength, balance, transfers and gait. Baseline:  Goal status: MET  2.  Pt will improve gait velocity to at least 1.9 ft/s w/LRAD for improved gait efficiency and reduced fall risk Baseline: 1.0 ft/s w/RW; 1.18 ft/s w/RW Goal status: NOT MET  3.  Pt will improve 5 x STS to less than or equal to 16s w/equal BLE weight acceptance seconds to demonstrate improved functional strength and transfer efficiency.  Baseline: 28.62s w/weight through RLE mostly' 19.63s w/equal weight shift (8/6); 17.38s  Goal status: PARTIALLY MET   4.  Pt will improve normal TUG to less than or equal to 25 seconds w/LRAD for improved functional mobility and decreased fall risk. Baseline: 37.32s w/RW and SBA; 27.04s  w/RW mod I  Goal status: PARTIALLY MET   ASSESSMENT:  CLINICAL IMPRESSION: Emphasis of skilled PT session on LTG assessment and DC from PT. Pt has partially met 2 of 4 LTGs, met 1 of 4 and did not meet 1 of 4. Pt has improved his walking speed from evaluation but not quite to goal level. Pt has also significantly improved his 5x STS and TUG times, narrowly missing each goal by 1-2s so consider pt as partially meeting both goals. Pt has been performing his HEP regularly and is evident by his ability to shift weight on LLE w/transfers and pick up L foot w/gait. Pt is ambulating much more efficiently and safely and has made good progress in PT despite setbacks w/hypertension. Pt and wife in agreement to DC from PT this date due to satisfaction  w/current functional level. Educated pt on how to obtain new PT referral in future if mobility needs change, pt verbalized understanding.   OBJECTIVE IMPAIRMENTS: Abnormal gait, decreased activity tolerance, decreased balance, decreased endurance, decreased mobility, difficulty walking, and decreased strength  ACTIVITY LIMITATIONS: carrying, lifting, squatting, stairs, transfers, and locomotion level  PARTICIPATION LIMITATIONS: meal prep, cleaning, laundry, medication management, driving, shopping, community activity, and yard work  PERSONAL FACTORS: Age, Fitness, Past/current experiences, and 1 comorbidity: cryptogenic stroke  are also affecting patient's functional outcome.   REHAB POTENTIAL: Fair due to chronicity of CVA and LLE weakness  CLINICAL DECISION MAKING: Stable/uncomplicated  EVALUATION COMPLEXITY: Low  PLAN:  PT FREQUENCY: 1-2x/week  PT DURATION: 6 weeks  PLANNED INTERVENTIONS: Therapeutic exercises, Therapeutic activity, Neuromuscular re-education, Balance training, Gait training, Patient/Family education, Self Care, Joint mobilization, Stair training, Orthotic/Fit training, DME instructions, Aquatic Therapy, Dry Needling, Manual therapy, and Re-evaluation    Jill Alexanders Shawnell Dykes, PT, DPT [facility] [address] [place], [place]  [address] Phone:  [phone] Fax:  [phone] 11/07/2022, 12:53 PM

## 2022-11-07 NOTE — Telephone Encounter (Signed)
Patient calling the office for samples of medication:   1.  What medication and dosage are you requesting samples for? rivaroxaban (XARELTO) 20 MG TABS tablet   2.  Are you currently out of this medication? Not yet, will be out on Friday. Pt spouse called in stating they hit hard times and unable to get full supply right now.

## 2022-11-08 ENCOUNTER — Ambulatory Visit: Payer: Self-pay

## 2022-11-08 MED ORDER — RIVAROXABAN 20 MG PO TABS: ORAL_TABLET | ORAL | Status: DC

## 2022-11-08 NOTE — Telephone Encounter (Signed)
Leaving pt 2 bottles of Xarelto 20 mg tablets, 2 weeks, at the front desk [address], for pt to pick up. FYI

## 2022-11-08 NOTE — Telephone Encounter (Signed)
Spoke with patient's wife. Reviewed the Xarelto with Me coverage cap support program. Per wife cost has been ~ $80-90 per month therefore this program would not be helpful. We discussed other medications but she doesn't want to change. She just started back at work so she should get back on track with income. Appreciative of the samples.

## 2022-11-08 NOTE — Telephone Encounter (Signed)
Ok to give 2 weeks of samples. Called and LVM to discuss Xarelto and Me program

## 2022-11-14 ENCOUNTER — Ambulatory Visit: Payer: Self-pay | Admitting: Physical Therapy

## 2022-11-16 LAB — CUP PACEART REMOTE DEVICE CHECK
Date Time Interrogation Session: 20240905230659
Implantable Pulse Generator Implant Date: 20210423

## 2022-11-19 ENCOUNTER — Ambulatory Visit (INDEPENDENT_AMBULATORY_CARE_PROVIDER_SITE_OTHER): Payer: Medicare HMO

## 2022-11-19 DIAGNOSIS — I639 Cerebral infarction, unspecified: Secondary | ICD-10-CM

## 2022-12-05 NOTE — Progress Notes (Signed)
[facility]link Summary Report / Loop Recorder 

## 2022-12-24 ENCOUNTER — Ambulatory Visit (INDEPENDENT_AMBULATORY_CARE_PROVIDER_SITE_OTHER): Payer: Medicare HMO

## 2022-12-24 DIAGNOSIS — I639 Cerebral infarction, unspecified: Secondary | ICD-10-CM

## 2022-12-25 LAB — CUP PACEART REMOTE DEVICE CHECK
Date Time Interrogation Session: 20241013231723
Implantable Pulse Generator Implant Date: 20210423

## 2022-12-28 ENCOUNTER — Other Ambulatory Visit: Payer: Self-pay | Admitting: Physician Assistant

## 2023-01-08 NOTE — Progress Notes (Signed)
[facility]link Summary Report / Loop Recorder 

## 2023-01-26 LAB — CUP PACEART REMOTE DEVICE CHECK
Date Time Interrogation Session: 20241116024651
Implantable Pulse Generator Implant Date: 20210423
Zone Setting Status: 755011
Zone Setting Status: 755011

## 2023-01-28 ENCOUNTER — Ambulatory Visit: Payer: Medicare HMO

## 2023-01-28 ENCOUNTER — Telehealth: Payer: Self-pay

## 2023-01-28 DIAGNOSIS — I639 Cerebral infarction, unspecified: Secondary | ICD-10-CM | POA: Diagnosis not present

## 2023-01-28 NOTE — Telephone Encounter (Signed)
Open in error

## 2023-02-05 ENCOUNTER — Encounter: Payer: Medicare HMO | Admitting: Pharmacist

## 2023-02-05 LAB — CUP PACEART REMOTE DEVICE CHECK
Date Time Interrogation Session: 20241117230308
Implantable Pulse Generator Implant Date: 20210423

## 2023-02-19 DIAGNOSIS — Z0100 Encounter for examination of eyes and vision without abnormal findings: Secondary | ICD-10-CM | POA: Diagnosis not present

## 2023-02-21 NOTE — Progress Notes (Signed)
[facility]link Summary Report / Loop Recorder 

## 2023-02-21 NOTE — Addendum Note (Signed)
Addended by: Geralyn Flash D on: 02/21/2023 09:44 AM   Modules accepted: Orders

## 2023-03-04 ENCOUNTER — Ambulatory Visit: Payer: Medicare HMO

## 2023-03-04 DIAGNOSIS — I639 Cerebral infarction, unspecified: Secondary | ICD-10-CM | POA: Diagnosis not present

## 2023-03-04 LAB — CUP PACEART REMOTE DEVICE CHECK
Date Time Interrogation Session: 20241222230214
Implantable Pulse Generator Implant Date: 20210423

## 2023-03-14 ENCOUNTER — Telehealth: Payer: Self-pay | Admitting: Cardiovascular Disease

## 2023-03-14 ENCOUNTER — Other Ambulatory Visit: Payer: Self-pay

## 2023-03-14 MED ORDER — RIVAROXABAN 20 MG PO TABS: ORAL_TABLET | ORAL | 0 refills | Status: DC

## 2023-03-14 NOTE — Telephone Encounter (Signed)
 Pt spouse called in stating they would like to apply for patient assistance for Xarelto. Pt's insurance has changed to [facility] and they are charging him $800 for it. Please advise.

## 2023-03-14 NOTE — Telephone Encounter (Signed)
 Prescription refill request for Xarelto received.  Indication:  Last office visit: 01/17/22 Lanna Poche)  Weight: 83kg Age: 73 Scr: 1.40 (10/02/22)  CrCl: 55.17ml/min  Office visit overdue. Pt has scheduled appt with Dr Nelly Laurence on 04/30/23. Refill sent.

## 2023-03-15 ENCOUNTER — Other Ambulatory Visit: Payer: Self-pay | Admitting: *Deleted

## 2023-03-15 ENCOUNTER — Other Ambulatory Visit (HOSPITAL_COMMUNITY): Payer: Self-pay

## 2023-03-15 MED ORDER — ATORVASTATIN CALCIUM 40 MG PO TABS: 40.0000 mg | ORAL_TABLET | Freq: Every day | ORAL | 1 refills | Status: DC

## 2023-03-15 MED ORDER — FOLIC ACID 1 MG PO TABS: 1.0000 mg | ORAL_TABLET | Freq: Every day | ORAL | 1 refills | Status: DC

## 2023-03-15 NOTE — Telephone Encounter (Signed)
 Spoke with patient wife (DPR on file) they have set up payment plans today for medicare part D $2,000 max out of pocket and were told that it should be processed in 24 hours, then should be able to get their xarelto . Patient will call back on Monday if they have any issues getting medication refilled.

## 2023-03-15 NOTE — Telephone Encounter (Signed)
 Patient currently has not met the requirements to qualify for the [employer] patient assistance program. The only option available for patient is SHIP. Patient can visit [email or web address] or call [phone]

## 2023-03-22 ENCOUNTER — Other Ambulatory Visit: Payer: Self-pay

## 2023-03-22 ENCOUNTER — Other Ambulatory Visit: Payer: Self-pay | Admitting: Student

## 2023-03-22 ENCOUNTER — Telehealth: Payer: Self-pay | Admitting: Cardiovascular Disease

## 2023-03-22 MED ORDER — RIVAROXABAN 20 MG PO TABS: ORAL_TABLET | ORAL | 0 refills | Status: DC

## 2023-03-22 MED ORDER — RIVAROXABAN 20 MG PO TABS: 20.0000 mg | ORAL_TABLET | Freq: Every day | ORAL | Status: DC

## 2023-03-22 NOTE — Telephone Encounter (Signed)
 Spoke with [name] (wife, DPR on file) patient has to get through mail order, the payment plan was established, and should be delivered within 5 days. Patient currently out of xarelto , providing one bottle of samples to get him thru until mail order arrives. No further needs at this time

## 2023-03-22 NOTE — Telephone Encounter (Signed)
 Spoke with wanda (wife, DPR on file). Initially [employer]iquis  was over $800 for this year, filled out paperwork and submitted to start payment plan, instructed it would take 24 hours for processing. Patient's wife calls back in to say still $800, instructed wife to ensure pharmacy has actually reversed claim and re-filed since paperwork has been completed for several days now and/or contact insurance directly if still not processing for corrected amount. Patient (or wife) to contact our office back if unable to obtain.

## 2023-03-22 NOTE — Telephone Encounter (Signed)
 Pt c/o medication issue:  1. Name of Medication: rivaroxaban  (XARELTO ) 20 MG TABS tablet   2. How are you currently taking this medication (dosage and times per day)?   3. Are you having a reaction (difficulty breathing--STAT)?   4. What is your medication issue? Patient's wife called stating it's going to cost him $830 for the medication.  She states that the first of the year he changed to [employer] and they don't do the payment plan like the old insurance, they want the whole money before giving the medication and they can't afford that.  She states he is currently out of Xarelto .  She wants to know if he needs to stay on this medication, she states when he first had his stroke he was a heavy drinker, but he has stopped drinking since then.

## 2023-03-22 NOTE — Addendum Note (Signed)
 Addended by: Sherle Poe R on: 03/22/2023 12:00 PM   Modules accepted: Orders

## 2023-03-22 NOTE — Telephone Encounter (Signed)
 Prescription refill request for Xarelto  received.  Indication: Last office visit: 01/17/22 Ema)  Weight: 83kg Age: 73 Scr: 1.40 (10/02/22)  CrCl: 56.42ml/min  Office visit overdue. Pt has a scheduled appt with Dr Nancey on 04/30/23. Refill sent. To prevent any missed doses.

## 2023-04-08 ENCOUNTER — Ambulatory Visit (INDEPENDENT_AMBULATORY_CARE_PROVIDER_SITE_OTHER): Payer: Medicare HMO

## 2023-04-08 DIAGNOSIS — I639 Cerebral infarction, unspecified: Secondary | ICD-10-CM

## 2023-04-08 LAB — CUP PACEART REMOTE DEVICE CHECK
Date Time Interrogation Session: 20250126230141
Implantable Pulse Generator Implant Date: 20210423

## 2023-04-11 NOTE — Progress Notes (Signed)
Care[facility] Summary Report / Loop Recorder

## 2023-04-12 ENCOUNTER — Encounter: Payer: Self-pay | Admitting: Cardiovascular Disease

## 2023-04-17 ENCOUNTER — Other Ambulatory Visit: Payer: Self-pay | Admitting: Student

## 2023-04-28 ENCOUNTER — Other Ambulatory Visit: Payer: Self-pay | Admitting: Student

## 2023-04-30 ENCOUNTER — Encounter: Payer: Medicare HMO | Admitting: Cardiovascular Disease

## 2023-05-02 NOTE — Progress Notes (Signed)
 Cardiology Office Note Date:  05/02/2023  Patient ID:  Russell, Russell 1950/06/13, MRN [id number] PCP:  Jarold Motto, PA  EP:  Dr. Johney Frame > inactive    Chief Complaint:   annual visit   History of Present Illness: Russell Russell is a 73 y.o. male with history of HTN, HLD, tobacco and ETOH abuse and most recently stroke (Multiple scattered anterior and posterior R brain infarcts embolic secondary to unclear source)>> loop.  Hospitalized April with cryptogenic stroke underwent loop implant.  Discharged to [name] 07/07/19.  He developed fever in rehab evaluated by ID, signed off with no evidence of bacterial process, off antibiotics.  Discharged from [facility] 08/05/2019.  I saw him 01/27/2020 He was noted via loop remote to have an AF episode just over an hour in duration, he was started on Xarelto and scheduled for a visit He is accompanied by his wife. He is doing well, was unaware of any palpitations, symptoms when notified of his AFib episode. He denies any CP, palpitations or cardiac awareness, no SOB No dizziness, near syncope or syncope. He started the xarelto a week ago, no bleeding or signs of bleeding We discussed the indication and rational for a/c in Afib with his h/o stroke particularly Discussed risks/benefits of OAC Discussed symptoms, signs of bleeding to notify us of  Saw Russell Russell 01/17/22, doing well, tolerating OAC,  0% burden BP elevated had not taken his meds that AM yet No changes made   TODAY  He remains with some L sieded weakness, ambulates with a  walker, but denies difficulties/instabilities/falls No CP, palpitations No SOB No near syncope or syncope No bleeding/signs of bleeding  Fairly sedentary, wife mentions he has stationary bike pedals to use but doesn't   Device information MDT loop implanted 07/03/2019, cryptogenic stroke  AFib hx Diagnosed vial loop 01/2020 >> xarelto (eliquis not on formulary)  AAD none   Past Medical History:   Diagnosis Date   Alcohol abuse    drinks 1 gallon of Gin every weekend, nothing during the week x >15 yrs   Atrial fibrillation (HCC)    Elevated transaminase level    +elev bili: abd u/s 06/2014 showed stable small hepatic hemangiomas, o/w normal.   Essential hypertension    GERD (gastroesophageal reflux disease)    Hyperlipidemia 03/2014   Atorv started-chol improved   Hypertension    Impaired fasting glucose 03/2014   Nephrolithiasis    PUD (peptic ulcer disease)    Stroke (HCC) 2021   Tobacco dependence    chantix: "psych effects"    Past Surgical History:  Procedure Laterality Date   COLONOSCOPY  2005   Recall 10 yrs ([place])   LOOP RECORDER INSERTION N/A 07/03/2019   Procedure: LOOP RECORDER INSERTION;  Surgeon: Hillis Range, MD;  Location: [facility] INVASIVE CV LAB;  Service: Cardiovascular;  Laterality: N/A;   removal of kidney stones  2006   cystoscopic--removed from ureter.  No prob since.    Current Outpatient Medications  Medication Sig Dispense Refill   acetaminophen (TYLENOL) 325 MG tablet Take 2 tablets (650 mg total) by mouth every 4 (four) hours as needed for mild pain (or temp > 37.5 C (99.5 F)).     atorvastatin (LIPITOR) 40 MG tablet Take 1 tablet (40 mg total) by mouth daily. 90 tablet 1   folic acid (FOLVITE) 1 MG tablet Take 1 tablet (1 mg total) by mouth daily. 90 tablet 1   losartan (COZAAR) 25 MG tablet TAKE 1 TABLET (25  MG TOTAL) BY MOUTH DAILY. 90 tablet 1   metoprolol succinate (TOPROL-XL) 25 MG 24 hr tablet TAKE 1 TABLET BY MOUTH EVERYDAY AT BEDTIME 15 tablet 0   Multiple Vitamin (MULTIVITAMIN WITH MINERALS) TABS tablet Take 1 tablet by mouth daily.     omeprazole (PRILOSEC) 20 MG capsule Take 20 mg by mouth as needed (acid reflux).     rivaroxaban (XARELTO) 20 MG TABS tablet TAKE 1 TABLET EVERY DAY WITH SUPPER. 90 tablet 0   rivaroxaban (XARELTO) 20 MG TABS tablet Take 1 tablet (20 mg total) by mouth daily with supper.     No current  facility-administered medications for this visit.    Allergies:   Penicillins   Social History:  The patient  reports that he has been smoking cigarettes. He started smoking about 51 years ago. He has a 12.8 pack-year smoking history. He has never used smokeless tobacco. He reports that he does not currently use alcohol after a past usage of about 8.0 standard drinks of alcohol per week. He reports that he does not use drugs.   Family History:  The patient's family history includes Breast cancer in his sister and sister; Cancer in his father; Colon cancer in his mother.  ROS:  Please see the history of present illness.    All other systems are reviewed and otherwise negative.   PHYSICAL EXAM:  VS:  There were no vitals taken for this visit. BMI: There is no height or weight on file to calculate BMI. Well nourished, well developed, though thin, in no acute distress  HEENT: normocephalic, atraumatic  Neck: no JVD, carotid bruits or masses Cardiac:  RRR; no significant murmurs, no rubs, or gallops Lungs: CTA b/l, no wheezing, rhonchi or rales  Abd: soft, nontender MS: no deformity or atrophy Ext: no edema  Skin: warm and dry, no rash Neuro: Left sided weakness Psych: euthymic mood, full affect  ILR site is well healed, stable, no tethering or discomfort   EKG:  done today and reviewed by myself: SR 82bpm, LAD   ILR interrogation done today and reviewed by myself:  Battery is good AFib burden 0.2% Russell Russell, pause, tachy turned on today   07/24/2019 TTE  IMPRESSIONS   1. Left ventricular ejection fraction, by estimation, is 60 to 65%. The  left ventricle has normal function. The left ventricle has no regional  wall motion abnormalities. There is moderate left ventricular hypertrophy  of the basal-septal segment. Left  ventricular diastolic parameters were normal.   2. Right ventricular systolic function is normal. The right ventricular  size is normal. There is mildly elevated  pulmonary artery systolic  pressure. The estimated right ventricular systolic pressure is 35.6 mmHg.   3. The mitral valve is normal in structure. Trivial mitral valve  regurgitation. No evidence of mitral stenosis.   4. The aortic valve is normal in structure. Aortic valve regurgitation is  not visualized. No aortic stenosis is present.   5. The inferior vena cava is dilated in size with <50% respiratory  variability, suggesting right atrial pressure of 15 mmHg.   Conclusion(s)/Recommendation(s): No evidence of valvular vegetations on  this transthoracic echocardiogram. Would recommend a transesophageal  echocardiogram to exclude infective endocarditis if clinically indicated.    07/01/2019: TTE IMPRESSIONS  1. Very poor study. All views basically obtained from the subcostal  views.   2. Left ventricular ejection fraction, by estimation, is 55 to 60%. The  left ventricle has normal function. The left ventricle has no regional  wall motion abnormalities. Left ventricular diastolic function could not  be evaluated.   3. Right ventricular systolic function is normal. The right ventricular  size is normal.   4. The mitral valve is grossly normal. No evidence of mitral valve  regurgitation. No evidence of mitral stenosis.   5. The aortic valve is grossly normal. Aortic valve regurgitation is not  visualized. No aortic stenosis is present.   6. The inferior vena cava is normal in size with greater than 50%  respiratory variability, suggesting right atrial pressure of 3 mmHg.   7. Agitated saline contrast bubble study was negative, with no evidence  of any interatrial shunt.   Conclusion(s)/Recommendation(s): No intracardiac source of embolism  detected on this transthoracic study. A transesophageal echocardiogram is  recommended to exclude cardiac source of embolism if clinically indicated.    Recent Labs: 10/02/2022: ALT 17; BUN 11; Creatinine, Ser 1.40; Hemoglobin 14.3; Platelets  241.0; Potassium 4.4; Sodium 140; TSH 1.15  No results found for requested labs within last 365 days.   CrCl cannot be calculated (Patient's most recent lab result is older than the maximum 21 days allowed.).   Wt Readings from Last 3 Encounters:  10/02/22 183 lb (83 kg)  08/31/22 178 lb (80.7 kg)  08/30/22 178 lb (80.7 kg)     Other studies reviewed: Additional studies/records reviewed today include: summarized above  ASSESSMENT AND PLAN:  1. Cryptogenic stroke 2. >> paroxysmal AFib     CHA2DS2Vasc is 4, on Xarelto, appropriately dosed     Labs today  3. HTN     Better at home     Continue management with the PMD    Urged to quit smoking Discussed transition to Dr. Nelly Laurence  Disposition: back again in a year, sooner if needed  Current medicines are reviewed at length with the patient today.  The patient did not have any concerns regarding medicines.  Norma Fredrickson, PA-C 05/02/2023 10:14 AM     [facility] [address] [place] [address] [phone] (office)  [phone] (fax)

## 2023-05-06 ENCOUNTER — Ambulatory Visit: Payer: No Typology Code available for payment source | Attending: Physician Assistant | Admitting: Physician Assistant

## 2023-05-06 VITALS — BP 142/90 | HR 84 | Ht 70.0 in | Wt 190.0 lb

## 2023-05-06 DIAGNOSIS — Z4509 Encounter for adjustment and management of other cardiac device: Secondary | ICD-10-CM | POA: Diagnosis not present

## 2023-05-06 DIAGNOSIS — Z79899 Other long term (current) drug therapy: Secondary | ICD-10-CM | POA: Diagnosis not present

## 2023-05-06 DIAGNOSIS — I480 Paroxysmal atrial fibrillation: Secondary | ICD-10-CM | POA: Diagnosis not present

## 2023-05-06 DIAGNOSIS — I10 Essential (primary) hypertension: Secondary | ICD-10-CM

## 2023-05-06 LAB — CUP PACEART INCLINIC DEVICE CHECK
Date Time Interrogation Session: 20250224162303
Implantable Pulse Generator Implant Date: 20210423

## 2023-05-06 NOTE — Patient Instructions (Addendum)
 Medication Instructions:   Your physician recommends that you continue on your current medications as directed. Please refer to the Current Medication list given to you today.   *If you need a refill on your cardiac medications before your next appointment, please call your pharmacy*    Lab Work: PLEASE GO DOWN STAIRS  [facility]  FIRST FLOOR  [address] ( GET OFF ELEVATORS MAKE A LEFT AND ANOTHER LEFT LAB ON RIGHT DOWN HALLWAY :  BMET AND CBC TODAY     If you have labs (blood work) drawn today and your tests are completely normal, you will receive your results only by: MyChart Message (if you have MyChart) OR A paper copy in the mail If you have any lab test that is abnormal or we need to change your treatment, we will call you to review the results.    Testing/Procedures: NONE ORDERED  TODAY    Follow-Up: At [facility], you and your health needs are our priority.  As part of our continuing mission to provide you with exceptional heart care, we have created designated Provider Care Teams.  These Care Teams include your primary Cardiologist (physician) and Advanced Practice Providers (APPs -  Physician Assistants and Nurse Practitioners) who all work together to provide you with the care you need, when you need it.  We recommend signing up for the patient portal called "MyChart".  Sign up information is provided on this After Visit Summary.  MyChart is used to connect with patients for Virtual Visits (Telemedicine).  Patients are able to view lab/test results, encounter notes, upcoming appointments, etc.  Non-urgent messages can be sent to your provider as well.   To learn more about what you can do with MyChart, go to [email or web address].    Your next appointment:    1 year(s)  Provider:    You may see Maurice Small, MD or one of the following Advanced Practice Providers on your designated Care Team:   Francis Dowse, [place]   Other Instructions    1st  Floor: - Lobby - Registration  - Pharmacy  - Lab - Cafe  2nd Floor: - PV Lab - Diagnostic Testing (echo, CT, nuclear med)  3rd Floor: - Vacant  4th Floor: - TCTS (cardiothoracic surgery) - AFib Clinic - Structural Heart Clinic - Vascular Surgery  - Vascular Ultrasound  5th Floor: - HeartCare Cardiology (general and EP) - Clinical Pharmacy for coumadin, hypertension, lipid, weight-loss medications, and med management appointments    Valet parking services will be available as well.

## 2023-05-07 LAB — BASIC METABOLIC PANEL
BUN/Creatinine Ratio: 12 (ref 10–24)
BUN: 15 mg/dL (ref 8–27)
CO2: 21 mmol/L (ref 20–29)
Calcium: 9.7 mg/dL (ref 8.6–10.2)
Chloride: 107 mmol/L — ABNORMAL HIGH (ref 96–106)
Creatinine, Ser: 1.28 mg/dL — ABNORMAL HIGH (ref 0.76–1.27)
Glucose: 88 mg/dL (ref 70–99)
Potassium: 5.1 mmol/L (ref 3.5–5.2)
Sodium: 144 mmol/L (ref 134–144)
eGFR: 60 mL/min/{1.73_m2} (ref >59)

## 2023-05-07 LAB — CBC
Hematocrit: 43.4 % (ref 37.5–51.0)
Hemoglobin: 14.8 g/dL (ref 13.0–17.7)
MCH: 31.3 pg (ref 26.6–33.0)
MCHC: 34.1 g/dL (ref 31.5–35.7)
MCV: 92 fL (ref 79–97)
Platelets: 251 10*3/uL (ref 150–450)
RBC: 4.73 x10E6/uL (ref 4.14–5.80)
RDW: 14 % (ref 11.6–15.4)
WBC: 5.8 10*3/uL (ref 3.4–10.8)

## 2023-05-12 ENCOUNTER — Encounter: Payer: Self-pay | Admitting: Cardiovascular Disease

## 2023-05-13 ENCOUNTER — Ambulatory Visit (INDEPENDENT_AMBULATORY_CARE_PROVIDER_SITE_OTHER): Payer: Medicare HMO

## 2023-05-13 DIAGNOSIS — I6381 Other cerebral infarction due to occlusion or stenosis of small artery: Secondary | ICD-10-CM | POA: Diagnosis not present

## 2023-05-14 ENCOUNTER — Other Ambulatory Visit: Payer: Self-pay | Admitting: Student

## 2023-05-14 LAB — CUP PACEART REMOTE DEVICE CHECK
Date Time Interrogation Session: 20250302230217
Implantable Pulse Generator Implant Date: 20210423

## 2023-05-15 ENCOUNTER — Encounter: Payer: Self-pay | Admitting: Cardiovascular Disease

## 2023-05-16 NOTE — Progress Notes (Signed)
 Carelink Summary Report / Loop Recorder

## 2023-05-22 DIAGNOSIS — L309 Dermatitis, unspecified: Secondary | ICD-10-CM | POA: Diagnosis not present

## 2023-05-22 DIAGNOSIS — L853 Xerosis cutis: Secondary | ICD-10-CM | POA: Diagnosis not present

## 2023-05-22 DIAGNOSIS — L299 Pruritus, unspecified: Secondary | ICD-10-CM | POA: Diagnosis not present

## 2023-05-22 DIAGNOSIS — L280 Lichen simplex chronicus: Secondary | ICD-10-CM | POA: Diagnosis not present

## 2023-06-13 NOTE — Progress Notes (Signed)
 Carelink Summary Report / Loop Recorder

## 2023-06-13 NOTE — Addendum Note (Signed)
 Addended by: Geralyn Flash D on: 06/13/2023 01:46 PM   Modules accepted: Orders

## 2023-06-17 ENCOUNTER — Ambulatory Visit (INDEPENDENT_AMBULATORY_CARE_PROVIDER_SITE_OTHER): Payer: Medicare HMO

## 2023-06-17 DIAGNOSIS — I6381 Other cerebral infarction due to occlusion or stenosis of small artery: Secondary | ICD-10-CM

## 2023-06-18 LAB — CUP PACEART REMOTE DEVICE CHECK
Date Time Interrogation Session: 20250406230251
Implantable Pulse Generator Implant Date: 20210423

## 2023-06-23 ENCOUNTER — Encounter: Payer: Self-pay | Admitting: Cardiovascular Disease

## 2023-06-24 ENCOUNTER — Telehealth: Payer: Self-pay | Admitting: Physician Assistant

## 2023-06-24 NOTE — Telephone Encounter (Signed)
 Spoke to pt's wife, told her [facility] reached out to us  to make sure pt is taking his Atorvastatin  40 mg, they say it is time for a refill. Told her last Rx was sent to [facility] so he should have enough and refills. Mrs. Balis said pt has been taking medication daily and he has a full bottle. Told her you may want to contact insurance and let them know. Mrs. Kimrey verbalized understanding.

## 2023-06-24 NOTE — Telephone Encounter (Signed)
 Copied from CRM [id number]. Topic: General - Call Back - No Documentation >> Jun 24, 2023 11:17 AM [employer] wrote: Reason for CRM: [facility] is unable to reach patient and requested that his provider reach out to him about medication adherence for atorvastatin (LIPITOR) 40 MG tablet. They stated it is time for a refill.

## 2023-07-22 ENCOUNTER — Ambulatory Visit (INDEPENDENT_AMBULATORY_CARE_PROVIDER_SITE_OTHER): Payer: Medicare HMO

## 2023-07-22 DIAGNOSIS — I6381 Other cerebral infarction due to occlusion or stenosis of small artery: Secondary | ICD-10-CM

## 2023-07-22 LAB — CUP PACEART REMOTE DEVICE CHECK
Date Time Interrogation Session: 20250511233210
Implantable Pulse Generator Implant Date: 20210423

## 2023-07-26 ENCOUNTER — Ambulatory Visit: Payer: Self-pay | Admitting: Cardiovascular Disease

## 2023-07-30 NOTE — Addendum Note (Signed)
 Addended by: Edra Govern D on: 07/30/2023 04:32 PM   Modules accepted: Orders

## 2023-07-30 NOTE — Progress Notes (Signed)
 Carelink Summary Report / Loop Recorder

## 2023-08-22 ENCOUNTER — Other Ambulatory Visit: Payer: Self-pay | Admitting: Acute Care

## 2023-08-22 ENCOUNTER — Ambulatory Visit (INDEPENDENT_AMBULATORY_CARE_PROVIDER_SITE_OTHER): Payer: Self-pay

## 2023-08-22 DIAGNOSIS — F17210 Nicotine dependence, cigarettes, uncomplicated: Secondary | ICD-10-CM

## 2023-08-22 DIAGNOSIS — I6381 Other cerebral infarction due to occlusion or stenosis of small artery: Secondary | ICD-10-CM | POA: Diagnosis not present

## 2023-08-22 DIAGNOSIS — Z87891 Personal history of nicotine dependence: Secondary | ICD-10-CM

## 2023-08-22 DIAGNOSIS — Z122 Encounter for screening for malignant neoplasm of respiratory organs: Secondary | ICD-10-CM

## 2023-08-22 LAB — CUP PACEART REMOTE DEVICE CHECK
Date Time Interrogation Session: 20250611230219
Implantable Pulse Generator Implant Date: 20210423

## 2023-08-24 ENCOUNTER — Ambulatory Visit: Payer: Self-pay | Admitting: Cardiovascular Disease

## 2023-09-09 NOTE — Progress Notes (Signed)
 Carelink Summary Report / Loop Recorder

## 2023-09-10 ENCOUNTER — Ambulatory Visit (HOSPITAL_BASED_OUTPATIENT_CLINIC_OR_DEPARTMENT_OTHER)

## 2023-09-10 ENCOUNTER — Ambulatory Visit (HOSPITAL_BASED_OUTPATIENT_CLINIC_OR_DEPARTMENT_OTHER)
Admission: RE | Admit: 2023-09-10 | Discharge: 2023-09-10 | Disposition: A | Discharge status: Home / Self Care | Source: Ambulatory Visit | Attending: Acute Care | Admitting: Acute Care

## 2023-09-10 DIAGNOSIS — F17210 Nicotine dependence, cigarettes, uncomplicated: Secondary | ICD-10-CM | POA: Diagnosis not present

## 2023-09-10 DIAGNOSIS — Z87891 Personal history of nicotine dependence: Secondary | ICD-10-CM | POA: Insufficient documentation

## 2023-09-10 DIAGNOSIS — I77810 Thoracic aortic ectasia: Secondary | ICD-10-CM | POA: Insufficient documentation

## 2023-09-10 DIAGNOSIS — Z122 Encounter for screening for malignant neoplasm of respiratory organs: Secondary | ICD-10-CM | POA: Diagnosis not present

## 2023-09-10 DIAGNOSIS — I700 Atherosclerosis of aorta: Secondary | ICD-10-CM | POA: Diagnosis not present

## 2023-09-10 DIAGNOSIS — J439 Emphysema, unspecified: Secondary | ICD-10-CM | POA: Diagnosis not present

## 2023-09-16 ENCOUNTER — Telehealth: Payer: Self-pay | Admitting: Cardiovascular Disease

## 2023-09-16 ENCOUNTER — Other Ambulatory Visit: Payer: Self-pay | Admitting: Cardiovascular Disease

## 2023-09-16 MED ORDER — RIVAROXABAN 20 MG PO TABS: ORAL_TABLET | ORAL | 1 refills | Status: AC

## 2023-09-16 NOTE — Telephone Encounter (Signed)
*  STAT* If patient is at the pharmacy, call can be transferred to refill team.   1. Which medications need to be refilled? (please list name of each medication and dose if known) rivaroxaban  (XARELTO ) 20 MG TABS tablet    2. Would you like to learn more about the convenience, safety, & potential cost savings by using the [facility]?     3. Are you open to using the [facility] (Type [facility].  ).   4. Which pharmacy/location (including street and city if local pharmacy) is medication to be sent to? [facility] - [place], [place] - [facility] AT Portal to [facility] Sites    5. Do they need a 30 day or 90 day supply? 90 day

## 2023-09-16 NOTE — Telephone Encounter (Signed)
 Prescription refill request for Xarelto  received.  Indication: CVA/PAF Last office visit: 05/06/23  JONELLE Arthur PA-C Weight: 13.7xh Age: 73 Scr: 1.28 on 05/06/23  Epic CrCl: 64.54  Based on above findings Xarelto  20mg  daily is the appropriate dose.  Refill approved.

## 2023-09-18 ENCOUNTER — Other Ambulatory Visit: Payer: Self-pay

## 2023-09-18 ENCOUNTER — Other Ambulatory Visit: Payer: Self-pay | Admitting: Physician Assistant

## 2023-09-18 DIAGNOSIS — F17210 Nicotine dependence, cigarettes, uncomplicated: Secondary | ICD-10-CM

## 2023-09-18 DIAGNOSIS — Z122 Encounter for screening for malignant neoplasm of respiratory organs: Secondary | ICD-10-CM

## 2023-09-18 DIAGNOSIS — Z87891 Personal history of nicotine dependence: Secondary | ICD-10-CM

## 2023-09-19 ENCOUNTER — Ambulatory Visit

## 2023-09-19 VITALS — BP 134/88 | HR 87 | Temp 97.3°F | Ht 70.0 in | Wt 194.6 lb

## 2023-09-19 DIAGNOSIS — Z0000 Encounter for general adult medical examination without abnormal findings: Secondary | ICD-10-CM

## 2023-09-19 NOTE — Patient Instructions (Signed)
 Russell Russell , Thank you for taking time out of your busy schedule to complete your Annual Wellness Visit with me. I enjoyed our conversation and look forward to speaking with you again next year. I, as well as your care team,  appreciate your ongoing commitment to your health goals. Please review the following plan we discussed and let me know if I can assist you in the future. Your Game plan/ To Do List    Referrals: If you haven't heard from the office you've been referred to, please reach out to them at the phone provided.   Follow up Visits: Next Medicare AWV with our clinical staff: 09/28/24   Have you seen your provider in the last 6 months (3 months if uncontrolled diabetes)? No Next Office Visit with your provider: 09/28/24  Clinician Recommendations:  Each day, aim for 6 glasses of water, plenty of protein in your diet and try to get up and walk/ stretch every hour for 5-10 minutes at a time.  If you wish to quit smoking, help is available. For free tobacco cessation program offerings call the [facility] at (442)156-6315 or Live Well Line at 5816882550. You may also visit www.[place].com or email livelifewell@[place] .com for more information on other programs.   You may also call 1-800-QUIT-NOW ((646)083-2285) or visit www.[place].ch or www.BecomeAnEx.org for additional resources on smoking cessation.        This is a list of the screening recommended for you and due dates:  Health Maintenance  Topic Date Due   DTaP/Tdap/Td vaccine (2 - Td or Tdap) 03/12/2016   Medicare Annual Wellness Visit  07/05/2023   Cologuard (Stool DNA test)  12/16/2023   Hepatitis C Screening  Completed   Hepatitis B Vaccine  Aged Out   HPV Vaccine  Aged Out   Meningitis B Vaccine  Aged Out   Screening for Lung Cancer  Discontinued   Pneumococcal Vaccine for age over 33  Discontinued   Flu Shot  Discontinued   Colon Cancer Screening  Discontinued   COVID-19 Vaccine   Discontinued   Zoster (Shingles) Vaccine  Discontinued    Advanced directives: (Declined) Advance directive discussed with you today. Even though you declined this today, please call our office should you change your mind, and we can give you the proper paperwork for you to fill out. Advance Care Planning is important because it:  [x]  Makes sure you receive the medical care that is consistent with your values, goals, and preferences  [x]  It provides guidance to your family and loved ones and reduces their decisional burden about whether or not they are making the right decisions based on your wishes.  Follow the link provided in your after visit summary or read over the paperwork we have mailed to you to help you started getting your Advance Directives in place. If you need assistance in completing these, please reach out to us  so that we can help you!  See attachments for Preventive Care and Fall Prevention Tips.

## 2023-09-19 NOTE — Progress Notes (Signed)
 Subjective:   Russell Russell is a 73 y.o. who presents for a Medicare Wellness preventive visit.  As a reminder, Annual Wellness Visits don't include a physical exam, and some assessments may be limited, especially if this visit is performed virtually. We may recommend an in-person follow-up visit with your provider if needed.  Visit Complete: In person    Persons Participating in Visit: Patient.  AWV Questionnaire: No: Patient Medicare AWV questionnaire was not completed prior to this visit.  Cardiac Risk Factors include: advanced age (>35men, >42 women);dyslipidemia;hypertension;male gender     Objective:    Today's Vitals   09/19/23 1439 09/19/23 1458  BP: (!) 148/98 134/88  Pulse: 87   Temp: (!) 97.3 F (36.3 C)   SpO2: 95%   Weight: 194 lb 9.6 oz (88.3 kg)   Height: 5' 10 (1.778 m)    Body mass index is 27.92 kg/m.     09/19/2023    2:51 PM 09/17/2022   10:22 AM 07/05/2022    3:08 PM 02/17/2021    2:28 PM 06/07/2020   11:52 AM 08/06/2019    4:14 PM 07/07/2019    4:02 PM  Advanced Directives  Does Patient Have a Medical Advance Directive? No Yes No No No No No  Type of Advance Directive  Healthcare Power of Attorney       Would patient like information on creating a medical advance directive? No - Patient declined  No - Patient declined Yes (MAU/Ambulatory/Procedural Areas - Information given) No - Patient declined Yes (MAU/Ambulatory/Procedural Areas - Information given) No - Patient declined    Current Medications (verified) Outpatient Encounter Medications as of 09/19/2023  Medication Sig   acetaminophen  (TYLENOL ) 325 MG tablet Take 2 tablets (650 mg total) by mouth every 4 (four) hours as needed for mild pain (or temp > 37.5 C (99.5 F)).   atorvastatin  (LIPITOR) 40 MG tablet Take 1 tablet (40 mg total) by mouth daily.   folic acid  (FOLVITE ) 1 MG tablet Take 1 tablet (1 mg total) by mouth daily.   losartan  (COZAAR ) 25 MG tablet TAKE 1 TABLET (25 MG TOTAL) BY  MOUTH DAILY.   metoprolol  succinate (TOPROL -XL) 25 MG 24 hr tablet TAKE 1 TABLET BY MOUTH EVERYDAY AT BEDTIME   Multiple Vitamin (MULTIVITAMIN WITH MINERALS) TABS tablet Take 1 tablet by mouth daily.   omeprazole (PRILOSEC) 20 MG capsule Take 20 mg by mouth as needed (acid reflux).   rivaroxaban  (XARELTO ) 20 MG TABS tablet TAKE 1 TABLET EVERY DAY WITH SUPPER.   [DISCONTINUED] XARELTO  20 MG TABS tablet TAKE 1 TABLET DAILY WITH   SUPPER   No facility-administered encounter medications on file as of 09/19/2023.    Allergies (verified) Penicillins   History: Past Medical History:  Diagnosis Date   Alcohol abuse    drinks 1 gallon of Gin every weekend, nothing during the week x >15 yrs   Atrial fibrillation (HCC)    Elevated transaminase level    +elev bili: abd u/s 06/2014 showed stable small hepatic hemangiomas, o/w normal.   Essential hypertension    GERD (gastroesophageal reflux disease)    Hyperlipidemia 03/2014   Atorv started-chol improved   Hypertension    Impaired fasting glucose 03/2014   Nephrolithiasis    PUD (peptic ulcer disease)    Stroke (HCC) 2021   Tobacco dependence    chantix: psych effects   Past Surgical History:  Procedure Laterality Date   COLONOSCOPY  2005   Recall 10 yrs ([place])  LOOP RECORDER INSERTION N/A 07/03/2019   Procedure: LOOP RECORDER INSERTION;  Surgeon: Kelsie Agent, MD;  Location: [facility];  Service: Cardiovascular;  Laterality: N/A;   removal of kidney stones  2006   cystoscopic--removed from ureter.  No prob since.   Family History  Problem Relation Age of Onset   Colon cancer Mother    Cancer Father    Breast cancer Sister    Breast cancer Sister    Stomach cancer Neg Hx    Esophageal cancer Neg Hx    Colon polyps Neg Hx    Social History   Socioeconomic History   Marital status: Married    Spouse name: Not on file   Number of children: 0   Years of education: Not on file   Highest education level: Not on  file  Occupational History   Not on file  Tobacco Use   Smoking status: Every Day    Current packs/day: 0.25    Average packs/day: 0.3 packs/day for 51.5 years (12.9 ttl pk-yrs)    Types: Cigarettes    Start date: 81   Smokeless tobacco: Never  Vaping Use   Vaping status: Never Used  Substance and Sexual Activity   Alcohol use: Not Currently    Alcohol/week: 8.0 standard drinks of alcohol    Types: 8 Cans of beer per week    Comment: Weekends only   Drug use: No   Sexual activity: Not on file  Other Topics Concern   Not on file  Social History Narrative   Married, no children.   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   Tob: 10 pack-yr hx (current as of 03/2014).   Alcohol: 8 beers and pint of whisky on weekends only.   Denies hx of prob with drugs or alcohol.   Social Drivers of Corporate investment banker Strain: Low Risk  (09/19/2023)   Overall Financial Resource Strain (CARDIA)    Difficulty of Paying Living Expenses: Not hard at all  Food Insecurity: No Food Insecurity (09/19/2023)   Hunger Vital Sign    Worried About Running Out of Food in the Last Year: Never true    Ran Out of Food in the Last Year: Never true  Transportation Needs: No Transportation Needs (09/19/2023)   PRAPARE - Administrator, Civil Service (Medical): No    Lack of Transportation (Non-Medical): No  Physical Activity: Inactive (09/19/2023)   Exercise Vital Sign    Days of Exercise per Week: 0 days    Minutes of Exercise per Session: 0 min  Stress: No Stress Concern Present (09/19/2023)   [employer] of Occupational Health - Occupational Stress Questionnaire    Feeling of Stress: Not at all  Social Connections: Moderately Isolated (09/19/2023)   Social Connection and Isolation Panel    Frequency of Communication with Friends and Family: Once a week    Frequency of Social Gatherings with Friends and Family: More than three times a week     Attends Religious Services: Never    Database administrator or Organizations: No    Attends Engineer, structural: Never    Marital Status: Married    Tobacco Counseling Ready to quit: Not Answered Counseling given: Not Answered    Clinical Intake:  Pre-visit preparation completed: Yes  Pain : No/denies pain     BMI - recorded: 27.92 Nutritional Status: BMI 25 -29 Overweight Diabetes: No  Lab  Results  Component Value Date   HGBA1C 6.2 07/27/2021   HGBA1C 6.4 04/24/2021   HGBA1C 6.4 10/25/2020     How often do you need to have someone help you when you read instructions, pamphlets, or other written materials from your doctor or pharmacy?: 1 - Never  Interpreter Needed?: No  Information entered by :: Ellouise Haws, LPN   Activities of Daily Living     09/19/2023    2:47 PM  In your present state of health, do you have any difficulty performing the following activities:  Hearing? 0  Vision? 0  Difficulty concentrating or making decisions? 0  Walking or climbing stairs? 0  Dressing or bathing? 0  Doing errands, shopping? 0  Preparing Food and eating ? N  Using the Toilet? N  In the past six months, have you accidently leaked urine? N  Do you have problems with loss of bowel control? N  Managing your Medications? N  Managing your Finances? N  Housekeeping or managing your Housekeeping? N    Patient Care Team: Job Lukes, [place] as PCP - General (Physician Assistant) Mealor, Eulas BRAVO, MD as PCP - Electrophysiology (Cardiology) Nicholaus Sherlean CROME, [facility] (Inactive) as Pharmacist (Pharmacist)  I have updated your Care Teams any recent Medical Services you may have received from other providers in the past year.     Assessment:   This is a routine wellness examination for Jes.  Hearing/Vision screen Hearing Screening - Comments:: Pt denies any hearing issues  Vision Screening - Comments:: Wears rx glasses - up to date with routine eye  exams with [facility]    Goals Addressed             This Visit's Progress    Patient Stated       Get off walker   If you wish to quit smoking, help is available. For free tobacco cessation program offerings call the [facility] at [phone] or Live Well Line at [phone]. You may also visit [email or web address].com or email [email or web address] for more information on other programs.   You may also call 1-800-QUIT-NOW (239-660-2334) or visit www.[facility].ch or www.[facility].org for additional resources on smoking cessation.         Depression Screen     09/19/2023    2:51 PM 10/02/2022    2:44 PM 07/05/2022    3:07 PM 07/26/2021    8:46 AM 02/17/2021    2:28 PM 01/23/2021   10:06 AM 10/25/2020   11:00 AM  PHQ 2/9 Scores  PHQ - 2 Score 0 0 0 0 0 0 0    Fall Risk     09/19/2023    2:53 PM 07/05/2022    3:09 PM 07/26/2021    8:45 AM 02/17/2021    2:29 PM 01/23/2021   10:06 AM  Fall Risk   Falls in the past year? 0 0 0 0 0  Number falls in past yr: 0 0 0 0 0  Injury with Fall? 0 0 0 0 0  Risk for fall due to : Impaired balance/gait;Impaired mobility Impaired vision  Impaired balance/gait;Impaired vision   Risk for fall due to: Comment    use walker   Follow up Falls prevention discussed Falls prevention discussed  Falls prevention discussed       Data saved with a previous flowsheet row definition    MEDICARE RISK AT HOME:  Medicare Risk at Home Any stairs in or around the home?: No If so,  are there any without handrails?: No Home free of loose throw rugs in walkways, pet beds, electrical cords, etc?: Yes Adequate lighting in your home to reduce risk of falls?: Yes Life alert?: No Use of a cane, walker or w/c?: Yes Grab bars in the bathroom?: No Shower chair or bench in shower?: Yes Elevated toilet seat or a handicapped toilet?: No  TIMED UP AND GO:  Was the test performed?  No  Cognitive Function: 6CIT completed         09/19/2023    2:53 PM 07/05/2022    3:11 PM 02/17/2021    2:32 PM  6CIT Screen  What Year? 0 points 0 points 0 points  What month? 0 points 3 points 0 points  What time? 0 points 0 points 0 points  Count back from 20 0 points 0 points 0 points  Months in reverse 4 points 4 points 0 points  Repeat phrase 4 points 2 points 0 points  Total Score 8 points 9 points 0 points    Immunizations Immunization History  Administered Date(s) Administered   Tdap 03/12/2006    Screening Tests Health Maintenance  Topic Date Due   DTaP/Tdap/Td (2 - Td or Tdap) 03/12/2016   Fecal DNA (Cologuard)  12/16/2023   Medicare Annual Wellness (AWV)  09/18/2024   Hepatitis C Screening  Completed   Hepatitis B Vaccines  Aged Out   HPV VACCINES  Aged Out   Meningococcal B Vaccine  Aged Out   Lung Cancer Screening  Discontinued   Pneumococcal Vaccine: 50+ Years  Discontinued   INFLUENZA VACCINE  Discontinued   Colonoscopy  Discontinued   COVID-19 Vaccine  Discontinued   Zoster Vaccines- Shingrix  Discontinued    Health Maintenance  Health Maintenance Due  Topic Date Due   DTaP/Tdap/Td (2 - Td or Tdap) 03/12/2016   Health Maintenance Items Addressed: See Nurse Notes at the end of this note  Additional Screening:  Vision Screening: Recommended annual ophthalmology exams for early detection of glaucoma and other disorders of the eye. Would you like a referral to an eye doctor? No    Dental Screening: Recommended annual dental exams for proper oral hygiene  Community Resource Referral / Chronic Care Management: CRR required this visit?  No   CCM required this visit?  No   Plan:    I have personally reviewed and noted the following in the patient's chart:   Medical and social history Use of alcohol, tobacco or illicit drugs  Current medications and supplements including opioid prescriptions. Patient is not currently taking opioid prescriptions. Functional ability and  status Nutritional status Physical activity Advanced directives List of other physicians Hospitalizations, surgeries, and ER visits in previous 12 months Vitals Screenings to include cognitive, depression, and falls Referrals and appointments  In addition, I have reviewed and discussed with patient certain preventive protocols, quality metrics, and best practice recommendations. A written personalized care plan for preventive services as well as general preventive health recommendations were provided to patient.   Ellouise VEAR Haws, LPN   2/89/7974   After Visit Summary: (In Person-Printed) AVS printed and given to the patient  Notes: Nothing significant to report at this time.

## 2023-09-23 ENCOUNTER — Ambulatory Visit (INDEPENDENT_AMBULATORY_CARE_PROVIDER_SITE_OTHER): Payer: Self-pay

## 2023-09-23 DIAGNOSIS — I6381 Other cerebral infarction due to occlusion or stenosis of small artery: Secondary | ICD-10-CM

## 2023-09-23 LAB — CUP PACEART REMOTE DEVICE CHECK
Date Time Interrogation Session: 20250713231835
Implantable Pulse Generator Implant Date: 20210423

## 2023-09-24 ENCOUNTER — Encounter: Payer: Self-pay | Admitting: Physician Assistant

## 2023-09-24 ENCOUNTER — Ambulatory Visit (INDEPENDENT_AMBULATORY_CARE_PROVIDER_SITE_OTHER): Admitting: Physician Assistant

## 2023-09-24 VITALS — BP 124/88 | HR 84 | Temp 97.5°F | Ht 70.0 in | Wt 193.8 lb

## 2023-09-24 DIAGNOSIS — I10 Essential (primary) hypertension: Secondary | ICD-10-CM

## 2023-09-24 DIAGNOSIS — I6340 Cerebral infarction due to embolism of unspecified cerebral artery: Secondary | ICD-10-CM | POA: Diagnosis not present

## 2023-09-24 LAB — COMPREHENSIVE METABOLIC PANEL WITH GFR
ALT: 30 U/L (ref 0–53)
AST: 28 U/L (ref 0–37)
Albumin: 3.9 g/dL (ref 3.5–5.2)
Alkaline Phosphatase: 97 U/L (ref 39–117)
BUN: 13 mg/dL (ref 6–23)
CO2: 27 meq/L (ref 19–32)
Calcium: 9.4 mg/dL (ref 8.4–10.5)
Chloride: 110 meq/L (ref 96–112)
Creatinine, Ser: 1.5 mg/dL (ref 0.40–1.50)
GFR: 46.47 mL/min — ABNORMAL LOW (ref >60.00)
Glucose, Bld: 101 mg/dL — ABNORMAL HIGH (ref 70–99)
Potassium: 4.2 meq/L (ref 3.5–5.1)
Sodium: 141 meq/L (ref 135–145)
Total Bilirubin: 0.9 mg/dL (ref 0.2–1.2)
Total Protein: 7.4 g/dL (ref 6.0–8.3)

## 2023-09-24 LAB — CBC WITH DIFFERENTIAL/PLATELET
Basophils Absolute: 0.1 K/uL (ref 0.0–0.1)
Basophils Relative: 0.8 % (ref 0.0–3.0)
Eosinophils Absolute: 0.3 K/uL (ref 0.0–0.7)
Eosinophils Relative: 4.3 % (ref 0.0–5.0)
HCT: 41.8 % (ref 39.0–52.0)
Hemoglobin: 13.9 g/dL (ref 13.0–17.0)
Lymphocytes Relative: 35 % (ref 12.0–46.0)
Lymphs Abs: 2.2 K/uL (ref 0.7–4.0)
MCHC: 33.4 g/dL (ref 30.0–36.0)
MCV: 92.6 fl (ref 78.0–100.0)
Monocytes Absolute: 0.6 K/uL (ref 0.1–1.0)
Monocytes Relative: 9.4 % (ref 3.0–12.0)
Neutro Abs: 3.2 K/uL (ref 1.4–7.7)
Neutrophils Relative %: 50.5 % (ref 43.0–77.0)
Platelets: 251 K/uL (ref 150.0–400.0)
RBC: 4.51 Mil/uL (ref 4.22–5.81)
RDW: 15.2 % (ref 11.5–15.5)
WBC: 6.3 K/uL (ref 4.0–10.5)

## 2023-09-24 LAB — LIPID PANEL
Cholesterol: 143 mg/dL (ref 0–200)
HDL: 30.3 mg/dL — ABNORMAL LOW (ref >39.00)
LDL Cholesterol: 79 mg/dL (ref 0–99)
NonHDL: 112.97
Total CHOL/HDL Ratio: 5
Triglycerides: 172 mg/dL — ABNORMAL HIGH (ref 0.0–149.0)
VLDL: 34.4 mg/dL (ref 0.0–40.0)

## 2023-09-24 LAB — HEMOGLOBIN A1C: Hgb A1c MFr Bld: 7 % — ABNORMAL HIGH (ref 4.6–6.5)

## 2023-09-24 NOTE — Patient Instructions (Signed)
 It was great to see you!  Please let me know if you would like to pursue physical therapy  Keep up the good work  Let's follow-up in 1 year, sooner if you have concerns.  Take care,  Lucie Buttner PA-C

## 2023-09-24 NOTE — Progress Notes (Signed)
 Russell Russell is a 73 y.o. male here for a follow up of a pre-existing problem.  History of Present Illness:   Chief Complaint  Patient presents with   Follow-up    Patient states no concerns or questions to discuss.     HPI  HTN  Patient reports compliance and good tolerance of Losartan  25 mg and metoprolol  25 mg XR once daily  At home BP readings range from 115-120/s / 70s-80s.  Patient does report decreasing his smoking intake.  Patient denies chest pain, falls, SOB, blurred vision, dizziness, unusual headaches, lower leg swelling. Patient is compliant with medication. Denies excessive caffeine intake, stimulant usage, excessive alcohol intake, or increase in salt  CVA of right basal ganglia Currently taking Xarelto  20 mg and Lipitor 40 mg daily Her wife states that his left leg continues to occasionally drag.  Patient is not interested in PT at this time but attempts at home exercises.  He does not currently drive.    Past Medical History:  Diagnosis Date   Alcohol abuse    drinks 1 gallon of Gin every weekend, nothing during the week x >15 yrs   Atrial fibrillation (HCC)    Elevated transaminase level    +elev bili: abd u/s 06/2014 showed stable small hepatic hemangiomas, o/w normal.   Essential hypertension    GERD (gastroesophageal reflux disease)    Hyperlipidemia 03/2014   Atorv started-chol improved   Hypertension    Impaired fasting glucose 03/2014   Nephrolithiasis    PUD (peptic ulcer disease)    Stroke (HCC) 2021   Tobacco dependence    chantix: psych effects     Social History   Tobacco Use   Smoking status: Every Day    Current packs/day: 0.25    Average packs/day: 0.3 packs/day for 51.5 years (12.9 ttl pk-yrs)    Types: Cigarettes    Start date: 1974   Smokeless tobacco: Never  Vaping Use   Vaping status: Never Used  Substance Use Topics   Alcohol use: Not Currently    Alcohol/week: 8.0 standard drinks of alcohol    Types: 8 Cans of beer  per week    Comment: Weekends only   Drug use: No    Past Surgical History:  Procedure Laterality Date   COLONOSCOPY  2005   Recall 10 yrs ([place])   LOOP RECORDER INSERTION N/A 07/03/2019   Procedure: LOOP RECORDER INSERTION;  Surgeon: Kelsie Agent, MD;  Location: [facility];  Service: Cardiovascular;  Laterality: N/A;   removal of kidney stones  2006   cystoscopic--removed from ureter.  No prob since.    Family History  Problem Relation Age of Onset   Colon cancer Mother    Cancer Father    Breast cancer Sister    Breast cancer Sister    Stomach cancer Neg Hx    Esophageal cancer Neg Hx    Colon polyps Neg Hx     Allergies  Allergen Reactions   Penicillins Rash    Current Medications:   Current Outpatient Medications:    acetaminophen  (TYLENOL ) 325 MG tablet, Take 2 tablets (650 mg total) by mouth every 4 (four) hours as needed for mild pain (or temp > 37.5 C (99.5 F))., Disp:  , Rfl:    atorvastatin  (LIPITOR) 40 MG tablet, Take 1 tablet (40 mg total) by mouth daily., Disp: 90 tablet, Rfl: 1   folic acid  (FOLVITE ) 1 MG tablet, Take 1 tablet (1 mg total) by mouth  daily., Disp: 90 tablet, Rfl: 1   losartan  (COZAAR ) 25 MG tablet, TAKE 1 TABLET (25 MG TOTAL) BY MOUTH DAILY., Disp: 90 tablet, Rfl: 1   metoprolol  succinate (TOPROL -XL) 25 MG 24 hr tablet, TAKE 1 TABLET BY MOUTH EVERYDAY AT BEDTIME, Disp: 90 tablet, Rfl: 3   Multiple Vitamin (MULTIVITAMIN WITH MINERALS) TABS tablet, Take 1 tablet by mouth daily., Disp:  , Rfl:    omeprazole (PRILOSEC) 20 MG capsule, Take 20 mg by mouth as needed (acid reflux)., Disp: , Rfl:    rivaroxaban  (XARELTO ) 20 MG TABS tablet, TAKE 1 TABLET EVERY DAY WITH SUPPER., Disp: 90 tablet, Rfl: 1   Review of Systems:   Review of Systems  Genitourinary: Negative.   Musculoskeletal:  Negative for falls.  Negative unless otherwise specified per HPI.  Vitals:   Vitals:   09/24/23 1010 09/24/23 1036  BP: (!) 126/92 124/88  Pulse:  84   Temp: (!) 97.5 F (36.4 C)   TempSrc: Temporal   SpO2: 98%   Weight: 193 lb 12.8 oz (87.9 kg)   Height: 5' 10 (1.778 m)      Body mass index is 27.81 kg/m.  Physical Exam:   Physical Exam Vitals and nursing note reviewed.  Constitutional:      General: He is not in acute distress.    Appearance: He is well-developed. He is not ill-appearing or toxic-appearing.  Cardiovascular:     Rate and Rhythm: Normal rate and regular rhythm.     Pulses: Normal pulses.     Heart sounds: Normal heart sounds, S1 normal and S2 normal.  Pulmonary:     Effort: Pulmonary effort is normal.     Breath sounds: Normal breath sounds.  Skin:    General: Skin is warm and dry.  Neurological:     Mental Status: He is alert.     GCS: GCS eye subscore is 4. GCS verbal subscore is 5. GCS motor subscore is 6.  Psychiatric:        Speech: Speech normal.        Behavior: Behavior normal. Behavior is cooperative.     Assessment and Plan:   Essential hypertension Normotensive Continue Losartan  25 mg and metoprolol  25 mg XR once daily  Follow up in 1 year, sooner if concerns  Cerebrovascular accident (CVA) due to embolism of cerebral artery (HCC) Compliant with Xarelto  and statin Update Hemoglobin A1c to assess for diabetic status Continue efforts at good blood pressure control Declines physical therapy Continue efforts at overall health and wellness  Lucie Buttner, PA-C  I,Safa M Kadhim,acting as a scribe for [employer], PA.,have documented all relevant documentation on the behalf of Lucie Buttner, PA,as directed by  Lucie Buttner, PA while in the presence of Lucie Buttner, [place].   I, Lucie Buttner, [place], have reviewed all documentation for this visit. The documentation on 09/24/23 for the exam, diagnosis, procedures, and orders are all accurate and complete.

## 2023-09-25 ENCOUNTER — Ambulatory Visit: Payer: Self-pay | Admitting: Physician Assistant

## 2023-09-25 ENCOUNTER — Ambulatory Visit: Payer: Self-pay | Admitting: Cardiovascular Disease

## 2023-09-26 ENCOUNTER — Other Ambulatory Visit: Payer: Self-pay | Admitting: *Deleted

## 2023-09-26 MED ORDER — ATORVASTATIN CALCIUM 40 MG PO TABS: 40.0000 mg | ORAL_TABLET | Freq: Every day | ORAL | 1 refills | Status: AC

## 2023-09-28 ENCOUNTER — Encounter: Payer: Self-pay | Admitting: Physician Assistant

## 2023-09-30 ENCOUNTER — Other Ambulatory Visit: Payer: Self-pay | Admitting: Physician Assistant

## 2023-09-30 DIAGNOSIS — I6340 Cerebral infarction due to embolism of unspecified cerebral artery: Secondary | ICD-10-CM

## 2023-09-30 DIAGNOSIS — R5381 Other malaise: Secondary | ICD-10-CM

## 2023-10-16 NOTE — Progress Notes (Signed)
 Carelink Summary Report / Loop Recorder

## 2023-10-24 ENCOUNTER — Ambulatory Visit (INDEPENDENT_AMBULATORY_CARE_PROVIDER_SITE_OTHER): Payer: Self-pay

## 2023-10-24 DIAGNOSIS — I6381 Other cerebral infarction due to occlusion or stenosis of small artery: Secondary | ICD-10-CM

## 2023-10-24 LAB — CUP PACEART REMOTE DEVICE CHECK
Date Time Interrogation Session: 20250813230859
Implantable Pulse Generator Implant Date: 20210423

## 2023-10-31 ENCOUNTER — Ambulatory Visit: Payer: Self-pay | Admitting: Cardiovascular Disease

## 2023-11-25 ENCOUNTER — Ambulatory Visit (INDEPENDENT_AMBULATORY_CARE_PROVIDER_SITE_OTHER): Payer: Self-pay

## 2023-11-25 DIAGNOSIS — I6381 Other cerebral infarction due to occlusion or stenosis of small artery: Secondary | ICD-10-CM | POA: Diagnosis not present

## 2023-11-26 LAB — CUP PACEART REMOTE DEVICE CHECK
Date Time Interrogation Session: 20250914231036
Implantable Pulse Generator Implant Date: 20210423

## 2023-11-30 ENCOUNTER — Ambulatory Visit: Payer: Self-pay | Admitting: Cardiovascular Disease

## 2023-12-02 ENCOUNTER — Telehealth: Payer: Self-pay | Admitting: *Deleted

## 2023-12-02 NOTE — Telephone Encounter (Signed)
 Copied from CRM [id number]. Topic: General - Other >> Dec 02, 2023  2:44 PM Lauren C wrote: Reason for CRM: Apolinar, wife on DPR, calling because pt is scheduled for jury duty 10/1, but they need proof from a doctor's office showing he cannot participate because he has had a stroke. Has appt Wednesday where he will want the form completed.

## 2023-12-02 NOTE — Telephone Encounter (Signed)
Will discuss at appt on Wed

## 2023-12-02 NOTE — Progress Notes (Signed)
 Remote Loop Recorder Transmission

## 2023-12-04 ENCOUNTER — Encounter: Payer: Self-pay | Admitting: Physician Assistant

## 2023-12-04 ENCOUNTER — Ambulatory Visit (INDEPENDENT_AMBULATORY_CARE_PROVIDER_SITE_OTHER): Admitting: Physician Assistant

## 2023-12-04 VITALS — BP 130/86 | HR 69 | Temp 97.3°F | Ht 70.0 in | Wt 194.0 lb

## 2023-12-04 DIAGNOSIS — I10 Essential (primary) hypertension: Secondary | ICD-10-CM

## 2023-12-04 DIAGNOSIS — R944 Abnormal results of kidney function studies: Secondary | ICD-10-CM

## 2023-12-04 DIAGNOSIS — R5381 Other malaise: Secondary | ICD-10-CM

## 2023-12-04 DIAGNOSIS — Z8673 Personal history of transient ischemic attack (TIA), and cerebral infarction without residual deficits: Secondary | ICD-10-CM

## 2023-12-04 DIAGNOSIS — E785 Hyperlipidemia, unspecified: Secondary | ICD-10-CM

## 2023-12-04 DIAGNOSIS — R739 Hyperglycemia, unspecified: Secondary | ICD-10-CM

## 2023-12-04 NOTE — Progress Notes (Signed)
 Shamal Cadenhead is a 73 y.o. male here for a follow up of a pre-existing problem.  History of Present Illness:   Chief Complaint  Patient presents with   Hypertension    Discussed the use of AI scribe software for clinical note transcription with the patient, who gave verbal consent to proceed.  History of Present Illness   Trygg Mantz is a 73 year old male with hypertension, hyperlipidemia, and diabetes who presents for a two-month follow-up visit. He is accompanied by his wife.  He is compliant with his medications for hypertension -- losartan  25 mg daily and metoprolol  succinate 25 mg 24 hours daily, and hyperlipidemia -- atorvastatin  40 mg daily. His blood sugar levels remain in the diabetic range, and he has not reduced his intake of sweets, consuming 'ICees' every other day. He does not regularly monitor his blood sugar as he is not on diabetes medication. His fluid intake includes occasional Gatorade and pink lemonade, with infrequent water consumption.  Recent blood work indicated a decline in kidney function. He has not used ibuprofen in the past month, only taking it occasionally for toothaches. He does not consume alcohol regularly, having only taken a sip of beer recently.        Past Medical History:  Diagnosis Date   Alcohol abuse    drinks 1 gallon of Gin every weekend, nothing during the week x >15 yrs   Atrial fibrillation (HCC)    Elevated transaminase level    +elev bili: abd u/s 06/2014 showed stable small hepatic hemangiomas, o/w normal.   Essential hypertension    GERD (gastroesophageal reflux disease)    Hyperlipidemia 03/2014   Atorv started-chol improved   Hypertension    Impaired fasting glucose 03/2014   Nephrolithiasis    PUD (peptic ulcer disease)    Stroke (HCC) 2021   Tobacco dependence    chantix: psych effects     Social History   Tobacco Use   Smoking status: Every Day    Current packs/day: 0.25    Average packs/day: 0.3 packs/day for  51.7 years (12.9 ttl pk-yrs)    Types: Cigarettes    Start date: 1974   Smokeless tobacco: Never  Vaping Use   Vaping status: Never Used  Substance Use Topics   Alcohol use: Not Currently    Alcohol/week: 8.0 standard drinks of alcohol    Types: 8 Cans of beer per week    Comment: Weekends only   Drug use: No    Past Surgical History:  Procedure Laterality Date   COLONOSCOPY  2005   Recall 10 yrs ([place])   LOOP RECORDER INSERTION N/A 07/03/2019   Procedure: LOOP RECORDER INSERTION;  Surgeon: Kelsie Agent, MD;  Location: [facility];  Service: Cardiovascular;  Laterality: N/A;   removal of kidney stones  2006   cystoscopic--removed from ureter.  No prob since.    Family History  Problem Relation Age of Onset   Colon cancer Mother    Cancer Father    Breast cancer Sister    Breast cancer Sister    Stomach cancer Neg Hx    Esophageal cancer Neg Hx    Colon polyps Neg Hx     Allergies  Allergen Reactions   Penicillins Rash    Current Medications:   Current Outpatient Medications:    acetaminophen  (TYLENOL ) 325 MG tablet, Take 2 tablets (650 mg total) by mouth every 4 (four) hours as needed for mild pain (or temp > 37.5 C (  99.5 F))., Disp:  , Rfl:    atorvastatin  (LIPITOR) 40 MG tablet, Take 1 tablet (40 mg total) by mouth daily., Disp: 90 tablet, Rfl: 1   folic acid  (FOLVITE ) 1 MG tablet, Take 1 tablet (1 mg total) by mouth daily., Disp: 90 tablet, Rfl: 1   losartan  (COZAAR ) 25 MG tablet, TAKE 1 TABLET (25 MG TOTAL) BY MOUTH DAILY., Disp: 90 tablet, Rfl: 1   metoprolol  succinate (TOPROL -XL) 25 MG 24 hr tablet, TAKE 1 TABLET BY MOUTH EVERYDAY AT BEDTIME, Disp: 90 tablet, Rfl: 3   Multiple Vitamin (MULTIVITAMIN WITH MINERALS) TABS tablet, Take 1 tablet by mouth daily., Disp:  , Rfl:    omeprazole (PRILOSEC) 20 MG capsule, Take 20 mg by mouth as needed (acid reflux)., Disp: , Rfl:    rivaroxaban  (XARELTO ) 20 MG TABS tablet, TAKE 1 TABLET EVERY DAY WITH SUPPER.,  Disp: 90 tablet, Rfl: 1   Review of Systems:   Negative unless otherwise specified per HPI.  Vitals:   Vitals:   12/04/23 1526  BP: 130/86  Pulse: 69  Temp: (!) 97.3 F (36.3 C)  TempSrc: Temporal  SpO2: 99%  Weight: 194 lb (88 kg)  Height: 5' 10 (1.778 m)     Body mass index is 27.84 kg/m.  Physical Exam:   Physical Exam Vitals and nursing note reviewed.  Constitutional:      General: He is not in acute distress.    Appearance: He is well-developed. He is not ill-appearing or toxic-appearing.  Cardiovascular:     Rate and Rhythm: Normal rate and regular rhythm.     Pulses: Normal pulses.     Heart sounds: Normal heart sounds, S1 normal and S2 normal.  Pulmonary:     Effort: Pulmonary effort is normal.     Breath sounds: Normal breath sounds.  Skin:    General: Skin is warm and dry.  Neurological:     Mental Status: He is alert.     GCS: GCS eye subscore is 4. GCS verbal subscore is 5. GCS motor subscore is 6.  Psychiatric:        Speech: Speech normal.        Behavior: Behavior normal. Behavior is cooperative.     Assessment and Plan:   Assessment and Plan     Essential hypertension Well controlled Continue current losartan  and metoprolol   Hyperglycemia Blood sugars remain elevated due to insufficient dietary changes. - Encouraged reduction of sweets and sugary drinks. - Recheck A1c in three months. - Discussed potential need for medication if no improvement.  Decreased GFR Previous decline in kidney function necessitates monitoring. - Recheck kidney function today and in three months.  -Avoid NSAIDs, stay hydrated  Dyslipidemia Continue Lipitor 40 mg daily          Myka Lukins, PA-C

## 2023-12-04 NOTE — Patient Instructions (Signed)
 It was great to see you!  Please work on eating and drinking less sweets  We will need to consider adding diabetic medication at next visit if levels are still uncontrolled  Let's follow-up in 2-3 months, sooner if you have concerns.  Take care,  Lucie Buttner PA-C

## 2023-12-05 NOTE — Progress Notes (Signed)
 Remote Loop Recorder Transmission

## 2023-12-18 NOTE — Progress Notes (Signed)
 Remote Loop Recorder Transmission

## 2023-12-22 ENCOUNTER — Other Ambulatory Visit: Payer: Self-pay | Admitting: Physician Assistant

## 2023-12-22 NOTE — Addendum Note (Signed)
 Addended by: Enrico Eaddy J on: 12/22/2023 09:14 AM   Modules accepted: Orders

## 2023-12-26 ENCOUNTER — Ambulatory Visit (INDEPENDENT_AMBULATORY_CARE_PROVIDER_SITE_OTHER): Payer: Self-pay

## 2023-12-26 DIAGNOSIS — I6381 Other cerebral infarction due to occlusion or stenosis of small artery: Secondary | ICD-10-CM

## 2023-12-26 LAB — CUP PACEART REMOTE DEVICE CHECK
Date Time Interrogation Session: 20251015230703
Implantable Pulse Generator Implant Date: 20210423

## 2024-01-01 NOTE — Progress Notes (Signed)
 Remote Loop Recorder Transmission

## 2024-01-06 ENCOUNTER — Telehealth: Payer: Self-pay | Admitting: Physician Assistant

## 2024-01-06 ENCOUNTER — Ambulatory Visit: Payer: Self-pay | Admitting: Cardiovascular Disease

## 2024-01-06 NOTE — Telephone Encounter (Signed)
 [facility] faxed Home Health Certificate (Order [place] [id number]), to be filled out by provider. Patient requested to send it back via Fax within ASAP. Document is located in providers tray at front office.Please advise at [phone].

## 2024-01-07 DIAGNOSIS — Z8711 Personal history of peptic ulcer disease: Secondary | ICD-10-CM

## 2024-01-07 DIAGNOSIS — Z87442 Personal history of urinary calculi: Secondary | ICD-10-CM

## 2024-01-07 DIAGNOSIS — I69354 Hemiplegia and hemiparesis following cerebral infarction affecting left non-dominant side: Secondary | ICD-10-CM | POA: Diagnosis not present

## 2024-01-07 DIAGNOSIS — F17210 Nicotine dependence, cigarettes, uncomplicated: Secondary | ICD-10-CM

## 2024-01-07 DIAGNOSIS — E785 Hyperlipidemia, unspecified: Secondary | ICD-10-CM

## 2024-01-07 DIAGNOSIS — Z9181 History of falling: Secondary | ICD-10-CM

## 2024-01-07 DIAGNOSIS — E119 Type 2 diabetes mellitus without complications: Secondary | ICD-10-CM | POA: Diagnosis not present

## 2024-01-07 DIAGNOSIS — I10 Essential (primary) hypertension: Secondary | ICD-10-CM | POA: Diagnosis not present

## 2024-01-07 DIAGNOSIS — Z7901 Long term (current) use of anticoagulants: Secondary | ICD-10-CM

## 2024-01-07 DIAGNOSIS — I4891 Unspecified atrial fibrillation: Secondary | ICD-10-CM | POA: Diagnosis not present

## 2024-01-07 DIAGNOSIS — K219 Gastro-esophageal reflux disease without esophagitis: Secondary | ICD-10-CM

## 2024-01-07 NOTE — Telephone Encounter (Signed)
 Signed and faxed back to [facility].

## 2024-01-22 ENCOUNTER — Telehealth: Payer: Self-pay | Admitting: *Deleted

## 2024-01-22 NOTE — Telephone Encounter (Signed)
 Copied from CRM [id number]. Topic: Clinical - Home Health Verbal Orders >> Jan 22, 2024  2:35 PM Rea ORN wrote: Caller/Agency: [facility]  Callback Number: [phone] Service Requested: Physical Therapy Frequency: 2 week/3 and then 1 week/1 Any new concerns about the patient? No, pt is making good progress.

## 2024-01-22 NOTE — Telephone Encounter (Signed)
 Called [facility] left detailed message on personal voicemail, verbal orders given for Service Requested: Physical Therapy, Frequency: 2 x's a week for 3 weeks and then 1 x a week for 1 week. Any questions please call office.

## 2024-01-28 ENCOUNTER — Ambulatory Visit: Attending: Cardiovascular Disease

## 2024-01-28 DIAGNOSIS — I6381 Other cerebral infarction due to occlusion or stenosis of small artery: Secondary | ICD-10-CM | POA: Diagnosis not present

## 2024-01-29 LAB — CUP PACEART REMOTE DEVICE CHECK
Date Time Interrogation Session: 20251117230949
Implantable Pulse Generator Implant Date: 20210423

## 2024-01-30 NOTE — Progress Notes (Signed)
 Remote Loop Recorder Transmission

## 2024-02-10 ENCOUNTER — Ambulatory Visit: Payer: Self-pay | Admitting: Cardiovascular Disease

## 2024-02-19 ENCOUNTER — Telehealth: Payer: Self-pay | Admitting: *Deleted

## 2024-02-19 NOTE — Telephone Encounter (Signed)
 Copied from CRM #[id number]. Topic: Clinical - Home Health Verbal Orders >> Feb 19, 2024  2:03 PM Berneda FALCON wrote: Caller/Agency: Marinda, PT with [facility] Callback Number: [phone] Service Requested: Physical Therapy Frequency:Continue therapy request 2X per week for 4 weeks Any new concerns about the patient? No, he is progressing just needs extra time.

## 2024-02-20 NOTE — Telephone Encounter (Signed)
 Called Marinda with [facility], left detailed message on personal voicemail, verbal orders for Physical Therapy, Frequency:Continue therapy request 2 times per week for 4 weeks okay per Lucie. Any questions please call office.

## 2024-02-28 ENCOUNTER — Telehealth: Payer: Self-pay | Admitting: Physician Assistant

## 2024-02-28 ENCOUNTER — Ambulatory Visit

## 2024-02-28 DIAGNOSIS — I6381 Other cerebral infarction due to occlusion or stenosis of small artery: Secondary | ICD-10-CM | POA: Diagnosis not present

## 2024-02-28 LAB — CUP PACEART REMOTE DEVICE CHECK
Date Time Interrogation Session: 20251218230325
Implantable Pulse Generator Implant Date: 20210423

## 2024-02-28 NOTE — Telephone Encounter (Signed)
 [facility] faxed Home Health Certificate (Order [place] [id number]), to be filled out by provider. Patient requested to send it back via Fax within ASAP. Document is located in providers tray at front office.Please advise at [phone].

## 2024-03-02 NOTE — Telephone Encounter (Signed)
 In Provider box to sign.

## 2024-03-02 NOTE — Telephone Encounter (Signed)
 Faxed confirmed p

## 2024-03-02 NOTE — Progress Notes (Signed)
 Remote Loop Recorder Transmission

## 2024-03-11 ENCOUNTER — Ambulatory Visit: Payer: Self-pay | Admitting: Cardiovascular Disease

## 2024-03-17 ENCOUNTER — Other Ambulatory Visit: Payer: Self-pay | Admitting: Physician Assistant

## 2024-03-20 ENCOUNTER — Telehealth: Payer: Self-pay | Admitting: *Deleted

## 2024-03-20 NOTE — Telephone Encounter (Unsigned)
 Copied from CRM [id number]. Topic: Clinical - Home Health Verbal Orders >> Mar 20, 2024  1:52 PM Maisie BROCKS wrote: Caller/Agency: Marinda (PT W/ [place]) Callback Number: [phone] Service Requested: Physical Therapy Frequency: 2x week for 3 weeks then 1 week 1 Any new concerns about the patient? No but he has not met his goals.

## 2024-03-23 NOTE — Telephone Encounter (Signed)
 Called : Marinda (PT [facility]/ [facility]), verbal orders given for Service Requested: Physical Therapy Frequency: 2x week for 3 weeks then 1 week 1, okay per Lucie. Any questions please call office.

## 2024-03-30 ENCOUNTER — Ambulatory Visit: Attending: Cardiovascular Disease

## 2024-03-30 ENCOUNTER — Encounter: Payer: Self-pay | Admitting: Physician Assistant

## 2024-03-30 ENCOUNTER — Ambulatory Visit: Admitting: Physician Assistant

## 2024-03-30 VITALS — BP 122/80 | HR 74 | Temp 97.9°F | Ht 70.0 in | Wt 193.0 lb

## 2024-03-30 DIAGNOSIS — N1831 Chronic kidney disease, stage 3a: Secondary | ICD-10-CM | POA: Diagnosis not present

## 2024-03-30 DIAGNOSIS — I6381 Other cerebral infarction due to occlusion or stenosis of small artery: Secondary | ICD-10-CM

## 2024-03-30 DIAGNOSIS — R739 Hyperglycemia, unspecified: Secondary | ICD-10-CM | POA: Diagnosis not present

## 2024-03-30 DIAGNOSIS — J439 Emphysema, unspecified: Secondary | ICD-10-CM

## 2024-03-30 DIAGNOSIS — Z8673 Personal history of transient ischemic attack (TIA), and cerebral infarction without residual deficits: Secondary | ICD-10-CM

## 2024-03-30 DIAGNOSIS — I480 Paroxysmal atrial fibrillation: Secondary | ICD-10-CM

## 2024-03-30 DIAGNOSIS — Z1211 Encounter for screening for malignant neoplasm of colon: Secondary | ICD-10-CM

## 2024-03-30 DIAGNOSIS — Z95811 Presence of heart assist device: Secondary | ICD-10-CM

## 2024-03-30 DIAGNOSIS — I69354 Hemiplegia and hemiparesis following cerebral infarction affecting left non-dominant side: Secondary | ICD-10-CM

## 2024-03-30 DIAGNOSIS — I10 Essential (primary) hypertension: Secondary | ICD-10-CM

## 2024-03-30 LAB — COMPREHENSIVE METABOLIC PANEL WITH GFR
ALT: 29 U/L (ref 3–53)
AST: 28 U/L (ref 5–37)
Albumin: 4.1 g/dL (ref 3.5–5.2)
Alkaline Phosphatase: 115 U/L (ref 39–117)
BUN: 12 mg/dL (ref 6–23)
CO2: 27 meq/L (ref 19–32)
Calcium: 9.9 mg/dL (ref 8.4–10.5)
Chloride: 107 meq/L (ref 96–112)
Creatinine, Ser: 1.34 mg/dL (ref 0.40–1.50)
GFR: 53.01 mL/min — ABNORMAL LOW
Glucose, Bld: 107 mg/dL — ABNORMAL HIGH (ref 70–99)
Potassium: 4.9 meq/L (ref 3.5–5.1)
Sodium: 139 meq/L (ref 135–145)
Total Bilirubin: 1.1 mg/dL (ref 0.2–1.2)
Total Protein: 8 g/dL (ref 6.0–8.3)

## 2024-03-30 LAB — CBC WITH DIFFERENTIAL/PLATELET
Basophils Absolute: 0 K/uL (ref 0.0–0.1)
Basophils Relative: 0.4 % (ref 0.0–3.0)
Eosinophils Absolute: 0.2 K/uL (ref 0.0–0.7)
Eosinophils Relative: 4.5 % (ref 0.0–5.0)
HCT: 43.2 % (ref 39.0–52.0)
Hemoglobin: 14.6 g/dL (ref 13.0–17.0)
Lymphocytes Relative: 43 % (ref 12.0–46.0)
Lymphs Abs: 2 K/uL (ref 0.7–4.0)
MCHC: 33.7 g/dL (ref 30.0–36.0)
MCV: 91.1 fl (ref 78.0–100.0)
Monocytes Absolute: 0.4 K/uL (ref 0.1–1.0)
Monocytes Relative: 7.9 % (ref 3.0–12.0)
Neutro Abs: 2.1 K/uL (ref 1.4–7.7)
Neutrophils Relative %: 44.2 % (ref 43.0–77.0)
Platelets: 257 K/uL (ref 150.0–400.0)
RBC: 4.74 Mil/uL (ref 4.22–5.81)
RDW: 15.2 % (ref 11.5–15.5)
WBC: 4.7 K/uL (ref 4.0–10.5)

## 2024-03-30 LAB — LIPID PANEL
Cholesterol: 147 mg/dL (ref 28–200)
HDL: 35.6 mg/dL — ABNORMAL LOW
LDL Cholesterol: 90 mg/dL (ref 10–99)
NonHDL: 110.95
Total CHOL/HDL Ratio: 4
Triglycerides: 104 mg/dL (ref 10.0–149.0)
VLDL: 20.8 mg/dL (ref 0.0–40.0)

## 2024-03-30 LAB — HEMOGLOBIN A1C: Hgb A1c MFr Bld: 6.5 % (ref 4.6–6.5)

## 2024-03-30 MED ORDER — FOLIC ACID 1 MG PO TABS: 1.0000 mg | ORAL_TABLET | Freq: Every day | ORAL | 1 refills | Status: AC

## 2024-03-30 NOTE — Patient Instructions (Signed)
 Wt Readings from Last 5 Encounters:  03/30/24 193 lb (87.5 kg)  12/04/23 194 lb (88 kg)  09/24/23 193 lb 12.8 oz (87.9 kg)  09/19/23 194 lb 9.6 oz (88.3 kg)  05/06/23 190 lb (86.2 kg)

## 2024-03-30 NOTE — Progress Notes (Signed)
 "  History of Present Illness:   Chief Complaint  Patient presents with   Medical Management of Chronic Issues    Pt here for f/u Hypertension, physical deconditioning,     Discussed the use of AI scribe software for clinical note transcription with the patient, who gave verbal consent to proceed.  History of Present Illness   Russell Russell is a 74 year old male with hypertension and diabetes who presents for a follow-up visit.  He monitors blood pressure daily at home with systolic readings in the upper 120s and persistent elevated diastolic. He continues to smoke but has cut back. He is taking losartan  25 mg daily and metoprolol  25 mg XR daily.  He uses a walker outside and a cane at home for mobility and denies falls. He is in home health therapy focusing on lower extremity strengthening.  He is taking his medications as prescribed. Cost is an issue for Xarelto  and he is on a payment plan. He requests a folic acid  refill.  His last A1c was 7 and he feels his glucose control has improved. He has reduced sugary foods and drinks. His weight is stable at 193 pounds, down one pound from the last visit.  He has a runny nose that he attributes to allergies. A daily non-drowsy antihistamine helps. He denies post-nasal drip or cough.      We have continue to watch his kidney function. It has remained steady over the past year. He is due for recheck of this. He avoids ibuprofen/NSAIDs due to use of Direct acting Oral AntiCoagulant (DOAC). He continues to push fluids as able to remain well hydrated.  He continues to smoke tobacco products but has reduced his intake. He is working with home health physical therapy given ongoing hemiparesis from his stroke. His wife reports that he is doing well, still has 4 sessions left.  He remains UpToDate with lung cancer screenings. He does have emphysema per documentation but reports there is no any obvious shortness of breath. He also reports that he is  not very active so does not really get the opportunity to challenge his respiratory abilities.  Remains on Direct acting Oral AntiCoagulant (DOAC) and has pacemaker in place for paroxysmal atrial fibrillation. Seeing cardiology for follow up soon.  His last interrogation on 03/11/24 did not show any significant findings.   Past Medical History:  Diagnosis Date   Alcohol abuse    drinks 1 gallon of Gin every weekend, nothing during the week x >15 yrs   Atrial fibrillation (HCC)    Elevated transaminase level    +elev bili: abd u/s 06/2014 showed stable small hepatic hemangiomas, o/w normal.   Essential hypertension    GERD (gastroesophageal reflux disease)    Hyperlipidemia 03/2014   Atorv started-chol improved   Hypertension    Impaired fasting glucose 03/2014   Nephrolithiasis    PUD (peptic ulcer disease)    Stroke (HCC) 2021   Tobacco dependence    chantix: psych effects     Social History[1]  Past Surgical History:  Procedure Laterality Date   COLONOSCOPY  2005   Recall 10 yrs ([place])   LOOP RECORDER INSERTION N/A 07/03/2019   Procedure: LOOP RECORDER INSERTION;  Surgeon: Kelsie Agent, MD;  Location: [facility];  Service: Cardiovascular;  Laterality: N/A;   removal of kidney stones  2006   cystoscopic--removed from ureter.  No prob since.    Family History  Problem Relation Age of Onset  Colon cancer Mother    Cancer Father    Breast cancer Sister    Breast cancer Sister    Stomach cancer Neg Hx    Esophageal cancer Neg Hx    Colon polyps Neg Hx     Allergies[2]  Current Medications:  Current Medications[3]   Review of Systems:   Negative unless otherwise specified per HPI.  Vitals:   Vitals:   03/30/24 1012  BP: 122/80  Pulse: 74  Temp: 97.9 F (36.6 C)  TempSrc: Temporal  SpO2: 98%  Weight: 193 lb (87.5 kg)  Height: 5' 10 (1.778 m)     Body mass index is 27.69 kg/m.  Physical Exam:   Physical Exam Vitals and nursing  note reviewed.  Constitutional:      General: He is not in acute distress.    Appearance: He is well-developed. He is not ill-appearing or toxic-appearing.  Cardiovascular:     Rate and Rhythm: Normal rate and regular rhythm.     Pulses: Normal pulses.     Heart sounds: Normal heart sounds, S1 normal and S2 normal.  Pulmonary:     Effort: Pulmonary effort is normal.     Breath sounds: Normal breath sounds.  Skin:    General: Skin is warm and dry.  Neurological:     Mental Status: He is alert.     GCS: GCS eye subscore is 4. GCS verbal subscore is 5. GCS motor subscore is 6.  Psychiatric:        Speech: Speech normal.        Behavior: Behavior normal. Behavior is cooperative.     Assessment and Plan:   Assessment and Plan    Stage 3a chronic kidney disease (HCC) Update renal panel and provide recommendations If further progression, will refer to renal  Hyperglycemia Previous A1c was 7.0. Believes improvement has occurred. Discussed dietary habits. - Checked blood sugars. - Encouraged increased water intake and reduced sugary drink consumption.  Essential hypertension Blood pressure averages in the upper 120s systolic, diastolic higher than desired. Smoking cessation advised. - Encouraged smoking cessation.  History of stroke; Hemiparesis affecting left side as late effect of stroke (HCC) Encouraged further tobacco reduction  Continue excellent blood pressure control Discussed need to ensure controlled Hemoglobin A1c to prevent future stroke Continue home health physical therapy as seems of benefit Continue statin (Lipitor)  Emphysema, unspecified (HCC) Reports there is no concerns or needs Continued monitoring with lung cancer screening Low threshold to see pulmonary for PFTs and any necessary management if symptom(s) arise  Paroxysmal atrial fibrillation (HCC) Remains compliant with Direct acting Oral AntiCoagulant (DOAC) and blood pressure well  controlled Continue close follow up with cardiology Continue metroprolol 25 mg XR daily  Special screening for malignant neoplasms, colon ColoGuard ordered  Presence of heart assist device (HCC) Remains compliant with cardiology; most recent interrogation overall normal Continue to monitor and follow up closely with cardiology  I personally spent a total of 42 minutes in the care of the patient today including preparing to see the patient, getting/reviewing separately obtained history, counseling and educating, placing orders, referring and communicating with other health care professionals, and documenting clinical information in the EHR.     Lucie Buttner, PA-C    [1]  Social History Tobacco Use   Smoking status: Every Day    Current packs/day: 0.25    Average packs/day: 0.3 packs/day for 52.0 years (13.0 ttl pk-yrs)    Types: Cigarettes    Start date: 60  Smokeless tobacco: Never  Vaping Use   Vaping status: Never Used  Substance Use Topics   Alcohol use: Not Currently    Alcohol/week: 8.0 standard drinks of alcohol    Types: 8 Cans of beer per week    Comment: Weekends only   Drug use: No  [2]  Allergies Allergen Reactions   Penicillins Rash  [3]  Current Outpatient Medications:    acetaminophen  (TYLENOL ) 325 MG tablet, Take 2 tablets (650 mg total) by mouth every 4 (four) hours as needed for mild pain (or temp > 37.5 C (99.5 F))., Disp:  , Rfl:    atorvastatin  (LIPITOR) 40 MG tablet, Take 1 tablet (40 mg total) by mouth daily., Disp: 90 tablet, Rfl: 1   losartan  (COZAAR ) 25 MG tablet, TAKE 1 TABLET (25 MG TOTAL) BY MOUTH DAILY., Disp: 90 tablet, Rfl: 1   metoprolol  succinate (TOPROL -XL) 25 MG 24 hr tablet, TAKE 1 TABLET BY MOUTH EVERYDAY AT BEDTIME, Disp: 90 tablet, Rfl: 3   Multiple Vitamin (MULTIVITAMIN WITH MINERALS) TABS tablet, Take 1 tablet by mouth daily., Disp:  , Rfl:    omeprazole (PRILOSEC) 20 MG capsule, Take 20 mg by mouth as needed (acid  reflux)., Disp: , Rfl:    rivaroxaban  (XARELTO ) 20 MG TABS tablet, TAKE 1 TABLET EVERY DAY WITH SUPPER., Disp: 90 tablet, Rfl: 1   folic acid  (FOLVITE ) 1 MG tablet, Take 1 tablet (1 mg total) by mouth daily., Disp: 90 tablet, Rfl: 1  "

## 2024-03-31 ENCOUNTER — Ambulatory Visit: Payer: Self-pay | Admitting: Physician Assistant

## 2024-03-31 LAB — CUP PACEART REMOTE DEVICE CHECK
Date Time Interrogation Session: 20260118230539
Implantable Pulse Generator Implant Date: 20210423

## 2024-04-02 NOTE — Progress Notes (Signed)
 Russell Russell                                          MRN: [id number]   04/02/2024   The [facility] Quality Team Specialist reviewed this patient medical record for the purposes of chart review for care gap closure. The following were reviewed: chart review for care gap closure-kidney health evaluation for diabetes:eGFR  and uACR.    [facility] Quality Team

## 2024-04-03 NOTE — Progress Notes (Signed)
 Remote Loop Recorder Transmission

## 2024-04-07 ENCOUNTER — Ambulatory Visit: Payer: Self-pay | Admitting: Cardiovascular Disease

## 2024-04-15 ENCOUNTER — Telehealth: Payer: Self-pay | Admitting: *Deleted

## 2024-04-15 NOTE — Telephone Encounter (Signed)
 Copied from CRM [id number]. Topic: Clinical - Medical Advice >> Apr 15, 2024  2:29 PM Laymon HERO wrote: Reason for CRM: Marinda # [phone] - achieved goals and is at a plateau. Will resume later on if he declines

## 2024-04-15 NOTE — Telephone Encounter (Signed)
 FYI, see message.

## 2024-04-30 ENCOUNTER — Ambulatory Visit

## 2024-05-31 ENCOUNTER — Ambulatory Visit

## 2024-06-10 ENCOUNTER — Ambulatory Visit: Admitting: Physician Assistant

## 2024-06-17 ENCOUNTER — Ambulatory Visit: Admitting: Cardiovascular Disease

## 2024-07-01 ENCOUNTER — Ambulatory Visit

## 2024-08-01 ENCOUNTER — Ambulatory Visit

## 2024-09-01 ENCOUNTER — Ambulatory Visit

## 2024-09-28 ENCOUNTER — Ambulatory Visit

## 2024-10-02 ENCOUNTER — Ambulatory Visit

## 2024-11-02 ENCOUNTER — Ambulatory Visit

## 2024-12-03 ENCOUNTER — Ambulatory Visit

## 2025-01-03 ENCOUNTER — Ambulatory Visit

## 2025-02-03 ENCOUNTER — Ambulatory Visit

## 2025-03-06 ENCOUNTER — Ambulatory Visit

## 2025-04-06 ENCOUNTER — Ambulatory Visit
